# Patient Record
Sex: Male | Born: 1940 | Race: White | Hispanic: No | Marital: Married | Smoking: Never smoker
Health system: Southern US, Community
[De-identification: ages and names within clinical notes are randomized; demographics above are authoritative.]

## PROBLEM LIST (undated history)

## (undated) DIAGNOSIS — M545 Low back pain: Secondary | ICD-10-CM

## (undated) DIAGNOSIS — M1990 Unspecified osteoarthritis, unspecified site: Secondary | ICD-10-CM

## (undated) DIAGNOSIS — Z95810 Presence of automatic (implantable) cardiac defibrillator: Secondary | ICD-10-CM

## (undated) DIAGNOSIS — F329 Major depressive disorder, single episode, unspecified: Secondary | ICD-10-CM

## (undated) DIAGNOSIS — D689 Coagulation defect, unspecified: Secondary | ICD-10-CM

## (undated) DIAGNOSIS — N481 Balanitis: Secondary | ICD-10-CM

## (undated) DIAGNOSIS — Z86010 Personal history of colonic polyps: Secondary | ICD-10-CM

## (undated) DIAGNOSIS — I428 Other cardiomyopathies: Secondary | ICD-10-CM

## (undated) DIAGNOSIS — G43909 Migraine, unspecified, not intractable, without status migrainosus: Secondary | ICD-10-CM

## (undated) DIAGNOSIS — R0609 Other forms of dyspnea: Secondary | ICD-10-CM

## (undated) DIAGNOSIS — E119 Type 2 diabetes mellitus without complications: Secondary | ICD-10-CM

## (undated) DIAGNOSIS — N529 Male erectile dysfunction, unspecified: Secondary | ICD-10-CM

## (undated) DIAGNOSIS — Z9289 Personal history of other medical treatment: Secondary | ICD-10-CM

## (undated) DIAGNOSIS — I219 Acute myocardial infarction, unspecified: Secondary | ICD-10-CM

## (undated) DIAGNOSIS — Z87438 Personal history of other diseases of male genital organs: Secondary | ICD-10-CM

## (undated) DIAGNOSIS — I442 Atrioventricular block, complete: Secondary | ICD-10-CM

## (undated) DIAGNOSIS — I509 Heart failure, unspecified: Secondary | ICD-10-CM

## (undated) DIAGNOSIS — Z125 Encounter for screening for malignant neoplasm of prostate: Secondary | ICD-10-CM

## (undated) DIAGNOSIS — N183 Chronic kidney disease, stage 3 (moderate): Secondary | ICD-10-CM

## (undated) DIAGNOSIS — I5042 Chronic combined systolic (congestive) and diastolic (congestive) heart failure: Secondary | ICD-10-CM

## (undated) DIAGNOSIS — H409 Unspecified glaucoma: Secondary | ICD-10-CM

## (undated) DIAGNOSIS — S93401A Sprain of unspecified ligament of right ankle, initial encounter: Secondary | ICD-10-CM

## (undated) DIAGNOSIS — I4821 Permanent atrial fibrillation: Secondary | ICD-10-CM

## (undated) DIAGNOSIS — N3946 Mixed incontinence: Secondary | ICD-10-CM

## (undated) DIAGNOSIS — R0600 Dyspnea, unspecified: Secondary | ICD-10-CM

## (undated) DIAGNOSIS — Z87442 Personal history of urinary calculi: Secondary | ICD-10-CM

## (undated) DIAGNOSIS — M6208 Separation of muscle (nontraumatic), other site: Secondary | ICD-10-CM

## (undated) DIAGNOSIS — I739 Peripheral vascular disease, unspecified: Secondary | ICD-10-CM

## (undated) DIAGNOSIS — I499 Cardiac arrhythmia, unspecified: Secondary | ICD-10-CM

## (undated) DIAGNOSIS — M47816 Spondylosis without myelopathy or radiculopathy, lumbar region: Secondary | ICD-10-CM

## (undated) DIAGNOSIS — E785 Hyperlipidemia, unspecified: Secondary | ICD-10-CM

## (undated) DIAGNOSIS — B9681 Helicobacter pylori [H. pylori] as the cause of diseases classified elsewhere: Secondary | ICD-10-CM

## (undated) DIAGNOSIS — G459 Transient cerebral ischemic attack, unspecified: Secondary | ICD-10-CM

## (undated) DIAGNOSIS — Z87898 Personal history of other specified conditions: Secondary | ICD-10-CM

## (undated) DIAGNOSIS — N401 Enlarged prostate with lower urinary tract symptoms: Secondary | ICD-10-CM

## (undated) DIAGNOSIS — K219 Gastro-esophageal reflux disease without esophagitis: Secondary | ICD-10-CM

## (undated) DIAGNOSIS — F32A Depression, unspecified: Secondary | ICD-10-CM

## (undated) DIAGNOSIS — I10 Essential (primary) hypertension: Secondary | ICD-10-CM

## (undated) DIAGNOSIS — G629 Polyneuropathy, unspecified: Secondary | ICD-10-CM

## (undated) DIAGNOSIS — Z950 Presence of cardiac pacemaker: Secondary | ICD-10-CM

## (undated) DIAGNOSIS — N400 Enlarged prostate without lower urinary tract symptoms: Secondary | ICD-10-CM

## (undated) DIAGNOSIS — N200 Calculus of kidney: Secondary | ICD-10-CM

## (undated) DIAGNOSIS — M810 Age-related osteoporosis without current pathological fracture: Secondary | ICD-10-CM

## (undated) DIAGNOSIS — M159 Polyosteoarthritis, unspecified: Secondary | ICD-10-CM

## (undated) DIAGNOSIS — C44609 Unspecified malignant neoplasm of skin of left upper limb, including shoulder: Secondary | ICD-10-CM

## (undated) DIAGNOSIS — K2970 Gastritis, unspecified, without bleeding: Secondary | ICD-10-CM

## (undated) HISTORY — PX: BACK SURGERY: SHX140

## (undated) HISTORY — DX: Benign prostatic hyperplasia without lower urinary tract symptoms: N40.0

## (undated) HISTORY — DX: Age-related osteoporosis without current pathological fracture: M81.0

## (undated) HISTORY — DX: Permanent atrial fibrillation: I48.21

## (undated) HISTORY — DX: Coagulation defect, unspecified: D68.9

## (undated) HISTORY — PX: SPINE SURGERY: SHX786

## (undated) HISTORY — DX: Atrioventricular block, complete: I44.2

## (undated) HISTORY — DX: Gastritis, unspecified, without bleeding: K29.70

## (undated) HISTORY — PX: RETINAL DETACHMENT SURGERY: SHX105

## (undated) HISTORY — DX: Male erectile dysfunction, unspecified: N52.9

## (undated) HISTORY — PX: WISDOM TOOTH EXTRACTION: SHX21

## (undated) HISTORY — DX: Spondylosis without myelopathy or radiculopathy, lumbar region: M47.816

## (undated) HISTORY — PX: JOINT REPLACEMENT: SHX530

## (undated) HISTORY — DX: Hyperlipidemia, unspecified: E78.5

## (undated) HISTORY — PX: TRANSTHORACIC ECHOCARDIOGRAM: SHX275

## (undated) HISTORY — DX: Chronic kidney disease, stage 3 (moderate): N18.3

## (undated) HISTORY — DX: Benign prostatic hyperplasia with lower urinary tract symptoms: N40.1

## (undated) HISTORY — DX: Balanitis: N48.1

## (undated) HISTORY — DX: Personal history of other specified conditions: Z87.898

## (undated) HISTORY — DX: Encounter for screening for malignant neoplasm of prostate: Z12.5

## (undated) HISTORY — DX: Dyspnea, unspecified: R06.00

## (undated) HISTORY — DX: Personal history of other medical treatment: Z92.89

## (undated) HISTORY — DX: Calculus of kidney: N20.0

## (undated) HISTORY — DX: Separation of muscle (nontraumatic), other site: M62.08

## (undated) HISTORY — DX: Mixed incontinence: N39.46

## (undated) HISTORY — PX: CATARACT EXTRACTION W/ INTRAOCULAR LENS IMPLANT & ANTERIOR VITRECTOMY, BILATERAL: SHX1310

## (undated) HISTORY — DX: Helicobacter pylori (H. pylori) as the cause of diseases classified elsewhere: B96.81

## (undated) HISTORY — DX: Personal history of other diseases of male genital organs: Z87.438

## (undated) HISTORY — DX: Polyosteoarthritis, unspecified: M15.9

## (undated) HISTORY — DX: Low back pain: M54.5

## (undated) HISTORY — PX: COLONOSCOPY W/ POLYPECTOMY: SHX1380

## (undated) HISTORY — DX: Unspecified glaucoma: H40.9

## (undated) HISTORY — DX: Other forms of dyspnea: R06.09

## (undated) HISTORY — DX: Personal history of colonic polyps: Z86.010

## (undated) HISTORY — DX: Chronic combined systolic (congestive) and diastolic (congestive) heart failure: I50.42

## (undated) HISTORY — DX: Sprain of unspecified ligament of right ankle, initial encounter: S93.401A

---

## 1974-07-24 HISTORY — PX: LUMBAR LAMINECTOMY: SHX95

## 1997-10-28 ENCOUNTER — Ambulatory Visit (HOSPITAL_COMMUNITY): Admission: RE | Admit: 1997-10-28 | Discharge: 1997-10-29 | Payer: Self-pay | Admitting: Ophthalmology

## 1999-07-13 ENCOUNTER — Encounter: Admission: RE | Admit: 1999-07-13 | Discharge: 1999-07-13 | Payer: Self-pay | Admitting: Family Medicine

## 1999-07-13 ENCOUNTER — Encounter: Payer: Self-pay | Admitting: Family Medicine

## 2000-08-30 ENCOUNTER — Ambulatory Visit (HOSPITAL_COMMUNITY): Admission: RE | Admit: 2000-08-30 | Discharge: 2000-08-30 | Payer: Self-pay | Admitting: Gastroenterology

## 2001-06-28 ENCOUNTER — Encounter: Payer: Self-pay | Admitting: Emergency Medicine

## 2001-06-28 ENCOUNTER — Emergency Department (HOSPITAL_COMMUNITY): Admission: EM | Admit: 2001-06-28 | Discharge: 2001-06-28 | Payer: Self-pay | Admitting: Emergency Medicine

## 2002-04-14 ENCOUNTER — Encounter: Payer: Self-pay | Admitting: Family Medicine

## 2002-04-14 ENCOUNTER — Encounter: Admission: RE | Admit: 2002-04-14 | Discharge: 2002-04-14 | Payer: Self-pay | Admitting: Family Medicine

## 2003-07-01 ENCOUNTER — Ambulatory Visit (HOSPITAL_COMMUNITY): Admission: RE | Admit: 2003-07-01 | Discharge: 2003-07-01 | Payer: Self-pay | Admitting: Family Medicine

## 2005-10-30 ENCOUNTER — Encounter: Admission: RE | Admit: 2005-10-30 | Discharge: 2005-10-30 | Payer: Self-pay | Admitting: Cardiovascular Disease

## 2005-11-02 ENCOUNTER — Ambulatory Visit (HOSPITAL_COMMUNITY): Admission: RE | Admit: 2005-11-02 | Discharge: 2005-11-02 | Payer: Self-pay | Admitting: *Deleted

## 2005-11-02 DIAGNOSIS — Z9289 Personal history of other medical treatment: Secondary | ICD-10-CM

## 2005-11-02 HISTORY — DX: Personal history of other medical treatment: Z92.89

## 2005-11-22 ENCOUNTER — Ambulatory Visit (HOSPITAL_COMMUNITY): Admission: RE | Admit: 2005-11-22 | Discharge: 2005-11-22 | Payer: Self-pay | Admitting: Cardiovascular Disease

## 2006-10-18 ENCOUNTER — Ambulatory Visit (HOSPITAL_COMMUNITY): Admission: RE | Admit: 2006-10-18 | Discharge: 2006-10-18 | Payer: Self-pay | Admitting: Gastroenterology

## 2006-10-18 ENCOUNTER — Encounter (INDEPENDENT_AMBULATORY_CARE_PROVIDER_SITE_OTHER): Payer: Self-pay | Admitting: *Deleted

## 2006-10-18 HISTORY — PX: ESOPHAGOGASTRODUODENOSCOPY: SHX1529

## 2007-06-26 ENCOUNTER — Encounter: Admission: RE | Admit: 2007-06-26 | Discharge: 2007-06-26 | Payer: Self-pay | Admitting: Internal Medicine

## 2007-07-10 ENCOUNTER — Encounter
Admission: RE | Admit: 2007-07-10 | Discharge: 2007-07-10 | Payer: Self-pay | Admitting: Physical Medicine and Rehabilitation

## 2008-08-19 ENCOUNTER — Emergency Department (HOSPITAL_COMMUNITY): Admission: EM | Admit: 2008-08-19 | Discharge: 2008-08-19 | Payer: Self-pay | Admitting: Emergency Medicine

## 2010-01-16 ENCOUNTER — Emergency Department (HOSPITAL_BASED_OUTPATIENT_CLINIC_OR_DEPARTMENT_OTHER): Admission: EM | Admit: 2010-01-16 | Discharge: 2010-01-16 | Payer: Self-pay | Admitting: Emergency Medicine

## 2010-01-16 ENCOUNTER — Ambulatory Visit: Payer: Self-pay | Admitting: Diagnostic Radiology

## 2010-01-26 ENCOUNTER — Encounter: Admission: RE | Admit: 2010-01-26 | Discharge: 2010-01-26 | Payer: Self-pay | Admitting: Neurology

## 2010-07-24 HISTORY — PX: CARDIOVASCULAR STRESS TEST: SHX262

## 2010-11-07 LAB — DIFFERENTIAL
Basophils Absolute: 0 10*3/uL (ref 0.0–0.1)
Basophils Relative: 0 % (ref 0–1)
Eosinophils Absolute: 0.1 10*3/uL (ref 0.0–0.7)
Eosinophils Relative: 3 % (ref 0–5)
Lymphocytes Relative: 28 % (ref 12–46)
Lymphs Abs: 1.5 10*3/uL (ref 0.7–4.0)
Monocytes Absolute: 0.4 10*3/uL (ref 0.1–1.0)
Monocytes Relative: 7 % (ref 3–12)
Neutro Abs: 3.4 10*3/uL (ref 1.7–7.7)
Neutrophils Relative %: 62 % (ref 43–77)

## 2010-11-07 LAB — POCT CARDIAC MARKERS
CKMB, poc: <1 ng/mL — ABNORMAL LOW (ref 1.0–8.0)
Myoglobin, poc: 51.8 ng/mL (ref 12–200)
Troponin i, poc: <0.05 ng/mL (ref 0.00–0.09)

## 2010-11-07 LAB — POCT I-STAT, CHEM 8
BUN: 22 mg/dL (ref 6–23)
Calcium, Ion: 1.19 mmol/L (ref 1.12–1.32)
Chloride: 103 mEq/L (ref 96–112)
Creatinine, Ser: 1 mg/dL (ref 0.4–1.5)
Glucose, Bld: 140 mg/dL — ABNORMAL HIGH (ref 70–99)
HCT: 46 % (ref 39.0–52.0)
Hemoglobin: 15.6 g/dL (ref 13.0–17.0)
Potassium: 3.8 mEq/L (ref 3.5–5.1)
Sodium: 141 mEq/L (ref 135–145)
TCO2: 26 mmol/L (ref 0–100)

## 2010-11-07 LAB — CBC
HCT: 44.4 % (ref 39.0–52.0)
Hemoglobin: 14.9 g/dL (ref 13.0–17.0)
MCHC: 33.6 g/dL (ref 30.0–36.0)
MCV: 87.9 fL (ref 78.0–100.0)
Platelets: 182 10*3/uL (ref 150–400)
RBC: 5.05 MIL/uL (ref 4.22–5.81)
RDW: 13.8 % (ref 11.5–15.5)
WBC: 5.4 10*3/uL (ref 4.0–10.5)

## 2010-12-09 NOTE — Cardiovascular Report (Signed)
Mason [name], Mason [name] NO.:  [id number]   MEDICAL RECORD NO.:  [id number]          PATIENT TYPE:  OIB   LOCATION:  2899                         FACILITY:  [facility]   PHYSICIAN:  Darlin Priestly, MD  DATE OF BIRTH:  Dec 09, 1940   DATE OF PROCEDURE:  11/02/2005  DATE OF DISCHARGE:                              CARDIAC CATHETERIZATION   PROCEDURE:  A head-up tilt-table testing.   COMPLICATIONS:  None.   INDICATIONS:  [name] is a 70 year old male, a patient of Dr. Daphene Jaeger and  Dr. Garner Nash with a history of diabetes, hypertension, history of sinus  bradycardia with first-degree AV block.  He has had recurrent episodes of  dizziness, but no episodes of syncope.  He is now referred for tilt-table  testing to rule out possible neurocardiogenic etiology.   After obtaining informed consent, patient was brought to the cardiac cath  lab in a fasting state.  He was then placed in the supine position and  hemodynamic measurements obtained.  Resting blood pressure 131/79, resting  heart rate 50.  He was monitored for approximately 5 minutes.  He is then  tilted to a 70 degree head-up position, which was maintained for 45 minutes.  He had no significant change in his heart rate or blood pressure.  He  remained asymptomatic.  After 45 minutes, he was returned to a supine  position.  Again, his hemodynamics remained within normal limits.  The test  was then terminated.  He is then transferred to the recovery room in stable  condition.   CONCLUSIONS:  Negative head-up tilt table testing.      Darlin Priestly, MD  Electronically Signed     RHM/MEDQ  D:  11/02/2005  T:  11/02/2005  Job:  [id number]   cc:   Nicki Guadalajara, M.D.  Fax: [phone]   Olene Craven, M.D.  Fax: [phone]

## 2010-12-09 NOTE — Procedures (Signed)
[facility]  Patient:    Mason [name], Mason [name]                        MRN: [id number] Proc. Date: 08/30/00 Adm. Date:  [id number] Attending:  Louie Bun CC:         Desma Maxim, M.D.   Procedure Report  PROCEDURE:  Colonoscopy.  INDICATION FOR PROCEDURE:  Family history of colon cancer in a first-degree relative.  DESCRIPTION OF PROCEDURE:  The patient was placed in the left lateral decubitus position and placed on the pulse monitor with continuous low-flow oxygen delivered by nasal cannula.  He was sedated with 70 mg IV Demerol and 6 mg IV Versed.  The Olympus video colonoscope was inserted into the rectum and advanced to the cecum, confirmed by transillumination at McBurneys point and visualization of the ileocecal valve and appendiceal orifice.  The prep was excellent.  The cecum, ascending, transverse, descending, and sigmoid colon all appeared normal with no masses, polyps, diverticula, or other mucosal abnormalities.  The rectum likewise appeared normal, and retroflex view of the anus revealed some small internal hemorrhoids.  The colonoscope was then withdrawn and the patient returned to the recovery room in stable condition. He tolerated the procedure well, and there were no immediate complications.  IMPRESSION:  Internal hemorrhoids, otherwise normal colonoscopy.  PLAN:  Repeat colonoscopy in five years. DD:  08/30/00 TD:  08/31/00 Job: [id number]

## 2011-06-24 DIAGNOSIS — N400 Enlarged prostate without lower urinary tract symptoms: Secondary | ICD-10-CM

## 2011-06-24 HISTORY — DX: Benign prostatic hyperplasia without lower urinary tract symptoms: N40.0

## 2011-08-04 DIAGNOSIS — Z961 Presence of intraocular lens: Secondary | ICD-10-CM | POA: Diagnosis not present

## 2011-08-04 DIAGNOSIS — H4011X0 Primary open-angle glaucoma, stage unspecified: Secondary | ICD-10-CM | POA: Diagnosis not present

## 2011-08-04 DIAGNOSIS — M545 Low back pain, unspecified: Secondary | ICD-10-CM | POA: Diagnosis not present

## 2011-08-19 ENCOUNTER — Emergency Department (INDEPENDENT_AMBULATORY_CARE_PROVIDER_SITE_OTHER): Payer: Medicare Other

## 2011-08-19 ENCOUNTER — Encounter (HOSPITAL_BASED_OUTPATIENT_CLINIC_OR_DEPARTMENT_OTHER): Payer: Self-pay | Admitting: *Deleted

## 2011-08-19 ENCOUNTER — Emergency Department (HOSPITAL_BASED_OUTPATIENT_CLINIC_OR_DEPARTMENT_OTHER)
Admission: EM | Admit: 2011-08-19 | Discharge: 2011-08-19 | Disposition: A | Discharge status: Home / Self Care | Payer: Medicare Other | Attending: Emergency Medicine | Admitting: Emergency Medicine

## 2011-08-19 DIAGNOSIS — F329 Major depressive disorder, single episode, unspecified: Secondary | ICD-10-CM | POA: Insufficient documentation

## 2011-08-19 DIAGNOSIS — Z79899 Other long term (current) drug therapy: Secondary | ICD-10-CM | POA: Insufficient documentation

## 2011-08-19 DIAGNOSIS — M549 Dorsalgia, unspecified: Secondary | ICD-10-CM | POA: Diagnosis not present

## 2011-08-19 DIAGNOSIS — I10 Essential (primary) hypertension: Secondary | ICD-10-CM | POA: Insufficient documentation

## 2011-08-19 DIAGNOSIS — M519 Unspecified thoracic, thoracolumbar and lumbosacral intervertebral disc disorder: Secondary | ICD-10-CM

## 2011-08-19 DIAGNOSIS — M545 Low back pain, unspecified: Secondary | ICD-10-CM

## 2011-08-19 DIAGNOSIS — E119 Type 2 diabetes mellitus without complications: Secondary | ICD-10-CM | POA: Insufficient documentation

## 2011-08-19 DIAGNOSIS — F3289 Other specified depressive episodes: Secondary | ICD-10-CM | POA: Insufficient documentation

## 2011-08-19 HISTORY — DX: Depression, unspecified: F32.A

## 2011-08-19 HISTORY — DX: Major depressive disorder, single episode, unspecified: F32.9

## 2011-08-19 HISTORY — DX: Essential (primary) hypertension: I10

## 2011-08-19 LAB — URINALYSIS, ROUTINE W REFLEX MICROSCOPIC
Bilirubin Urine: NEGATIVE
Glucose, UA: NEGATIVE mg/dL
Hgb urine dipstick: NEGATIVE
Ketones, ur: NEGATIVE mg/dL
Leukocytes, UA: NEGATIVE
Nitrite: NEGATIVE
Protein, ur: NEGATIVE mg/dL
Specific Gravity, Urine: 1.012 (ref 1.005–1.030)
Urobilinogen, UA: 0.2 mg/dL (ref 0.0–1.0)
pH: 5.5 (ref 5.0–8.0)

## 2011-08-19 MED ORDER — HYDROMORPHONE HCL PF 2 MG/ML IJ SOLN
2.0000 mg | Freq: Once | INTRAMUSCULAR | Status: AC
  Administered 2011-08-19: 2 mg via INTRAMUSCULAR
  Filled 2011-08-19: qty 1

## 2011-08-19 MED ORDER — OXYCODONE-ACETAMINOPHEN 5-325 MG PO TABS: 2.0000 | ORAL_TABLET | ORAL | Status: AC | PRN

## 2011-08-19 MED ORDER — ONDANSETRON HCL 4 MG/2ML IJ SOLN
4.0000 mg | Freq: Once | INTRAMUSCULAR | Status: AC
  Administered 2011-08-19: 4 mg via INTRAMUSCULAR
  Filled 2011-08-19: qty 2

## 2011-08-19 MED ORDER — KETOROLAC TROMETHAMINE 60 MG/2ML IM SOLN
60.0000 mg | Freq: Once | INTRAMUSCULAR | Status: AC
  Administered 2011-08-19: 60 mg via INTRAMUSCULAR
  Filled 2011-08-19: qty 2
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
## 2011-08-19 NOTE — ED Provider Notes (Signed)
History     CSN: [id number]  Arrival date & time 08/19/11  1443   First MD Initiated Contact with Patient 08/19/11 1642      Chief Complaint  Patient presents with  . Back Pain    (Consider location/radiation/quality/duration/timing/severity/associated sxs/prior treatment) Patient is a 71 y.o. male presenting with back pain. The history is provided by the patient. No language interpreter was used.  Back Pain  This is a recurrent problem. The current episode started more than 1 week ago. The problem has been gradually worsening. The pain is associated with twisting. The pain is present in the lumbar spine. The quality of the pain is described as aching. The pain does not radiate. The pain is at a severity of 4/10. The symptoms are aggravated by certain positions. The pain is worse during the night. He has tried nothing for the symptoms. The treatment provided mild relief. Risk factors: hx of back problems.    Past Medical History  Diagnosis Date  . Back pain   . Hypertension   . Diabetes mellitus   . Depression     Past Surgical History  Procedure Date  . Eye surgery   . Back surgery     No family history on file.  History  Substance Use Topics  . Smoking status: Never Smoker   . Smokeless tobacco: Never Used  . Alcohol Use: 3.6 oz/week    6 Cans of beer per week      Review of Systems  Musculoskeletal: Positive for back pain.  All other systems reviewed and are negative.    Allergies  Review of patient's allergies indicates no known allergies.  Home Medications   Current Outpatient Rx  Name Route Sig Dispense Refill  . ASPIRIN 81 MG PO TABS Oral Take 160 mg by mouth daily.    [name] VITAMIN D 1000 UNITS PO TABS Oral Take 1,000 Units by mouth daily.    [name] ESOMEPRAZOLE MAGNESIUM 40 MG PO CPDR Oral Take 40 mg by mouth 2 (two) times daily.    . L-METHYLFOLATE-B6-B12 3-35-2 MG PO TABS Oral Take 1 tablet by mouth daily.    [name] LOSARTAN POTASSIUM 50 MG PO TABS Oral  Take 50 mg by mouth daily.    [name] METFORMIN HCL 1000 MG PO TABS Oral Take 1,000 mg by mouth 2 (two) times daily.    [name] ROPINIROLE HCL 1 MG PO TABS Oral Take 1 mg by mouth daily.    . SERTRALINE HCL 50 MG PO TABS Oral Take 50 mg by mouth daily.    [name] SIMVASTATIN 20 MG PO TABS Oral Take 20 mg by mouth every evening.    [name] SITAGLIPTIN PHOSPHATE 100 MG PO TABS Oral Take 100 mg by mouth daily.      BP 148/86  Pulse 58  Temp(Src) 98.2 F (36.8 C) (Oral)  Resp 18  SpO2 99%  Physical Exam  Vitals reviewed. Constitutional: He is oriented to person, place, and time. He appears well-developed.  HENT:  Head: Normocephalic.  Eyes: Pupils are equal, round, and reactive to light.  Neck: Normal range of motion.  Cardiovascular: Normal rate and normal heart sounds.   Pulmonary/Chest: Effort normal.  Abdominal: Soft. Bowel sounds are normal.  Musculoskeletal:       Tender low back,  Pain with movement.  nv and ns intact  Neurological: He is alert and oriented to person, place, and time.  Skin: Skin is warm.  Psychiatric: He has a normal mood and affect.  ED Course  Procedures (including critical care time)   Labs Reviewed  URINALYSIS, ROUTINE W REFLEX MICROSCOPIC   Dg Lumbar Spine Complete  08/19/2011  *RADIOLOGY REPORT*  Clinical Data: Low back pain  LUMBAR SPINE - COMPLETE 4+ VIEW  Comparison: Lumbar spine and 01/16/2010  Findings: Lateral view of the lumbar spine demonstrate retrolisthesis of L1 on L2 not changed from prior.  There is disc space narrowing at L1-L2 also similar to prior.  There is endplate osteophytosis from L1-L5 which is unchanged.  No evidence of new subluxation or loss of vertebral body height.  No pars fracture.  IMPRESSION:  1.  No change from prior. 2.  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  Original Report Authenticated By: Genevive Bi, M.D.     No diagnosis found.    MDM  Pt given torodol, dilaudid and zofran.  I will treat with percocet.  Pt  advised to see his MD for recheck on Monday        Langston Masker, [place] 08/19/11 1906

## 2011-08-19 NOTE — ED Provider Notes (Signed)
Medical screening examination/treatment/procedure(s) were performed by non-physician practitioner and as supervising physician I was immediately available for consultation/collaboration.   Forbes Cellar, MD 08/19/11 [phone]

## 2011-08-30 ENCOUNTER — Emergency Department (INDEPENDENT_AMBULATORY_CARE_PROVIDER_SITE_OTHER): Payer: Medicare Other

## 2011-08-30 ENCOUNTER — Emergency Department (HOSPITAL_BASED_OUTPATIENT_CLINIC_OR_DEPARTMENT_OTHER)
Admission: EM | Admit: 2011-08-30 | Discharge: 2011-08-30 | Disposition: A | Discharge status: Home / Self Care | Payer: Medicare Other | Attending: Emergency Medicine | Admitting: Emergency Medicine

## 2011-08-30 ENCOUNTER — Encounter (HOSPITAL_BASED_OUTPATIENT_CLINIC_OR_DEPARTMENT_OTHER): Payer: Self-pay | Admitting: Emergency Medicine

## 2011-08-30 DIAGNOSIS — E119 Type 2 diabetes mellitus without complications: Secondary | ICD-10-CM | POA: Insufficient documentation

## 2011-08-30 DIAGNOSIS — Z79899 Other long term (current) drug therapy: Secondary | ICD-10-CM | POA: Diagnosis not present

## 2011-08-30 DIAGNOSIS — I10 Essential (primary) hypertension: Secondary | ICD-10-CM | POA: Insufficient documentation

## 2011-08-30 DIAGNOSIS — F329 Major depressive disorder, single episode, unspecified: Secondary | ICD-10-CM | POA: Diagnosis not present

## 2011-08-30 DIAGNOSIS — M5137 Other intervertebral disc degeneration, lumbosacral region: Secondary | ICD-10-CM | POA: Diagnosis not present

## 2011-08-30 DIAGNOSIS — M5126 Other intervertebral disc displacement, lumbar region: Secondary | ICD-10-CM | POA: Diagnosis not present

## 2011-08-30 DIAGNOSIS — F3289 Other specified depressive episodes: Secondary | ICD-10-CM | POA: Insufficient documentation

## 2011-08-30 DIAGNOSIS — S61219A Laceration without foreign body of unspecified finger without damage to nail, initial encounter: Secondary | ICD-10-CM

## 2011-08-30 DIAGNOSIS — W298XXA Contact with other powered powered hand tools and household machinery, initial encounter: Secondary | ICD-10-CM | POA: Insufficient documentation

## 2011-08-30 DIAGNOSIS — S61209A Unspecified open wound of unspecified finger without damage to nail, initial encounter: Secondary | ICD-10-CM

## 2011-08-30 MED ORDER — TETANUS-DIPHTH-ACELL PERTUSSIS 5-2.5-18.5 LF-MCG/0.5 IM SUSP
0.5000 mL | Freq: Once | INTRAMUSCULAR | Status: AC
  Administered 2011-08-30: 0.5 mL via INTRAMUSCULAR
  Filled 2011-08-30: qty 0.5

## 2011-08-30 MED ORDER — LIDOCAINE HCL 2 % IJ SOLN
INTRAMUSCULAR | Status: AC
  Administered 2011-08-30: 20 mg
  Filled 2011-08-30: qty 1

## 2011-08-30 NOTE — Discharge Instructions (Signed)
Laceration Care, Adult     A laceration is a cut or lesion that goes through all layers of the skin and into the tissue just beneath the skin.  TREATMENT   Some lacerations may not require closure. Some lacerations may not be able to be closed due to an increased risk of infection. It is important to see your caregiver as soon as possible after an injury to minimize the risk of infection and maximize the opportunity for successful closure.  If closure is appropriate, pain medicines may be given, if needed. The wound will be cleaned to help prevent infection. Your caregiver will use stitches (sutures), staples, wound glue (adhesive), or skin adhesive strips to repair the laceration. These tools bring the skin edges together to allow for faster healing and a better cosmetic outcome. However, all wounds will heal with a scar. Once the wound has healed, scarring can be minimized by covering the wound with sunscreen during the day for 1 full year.  HOME CARE INSTRUCTIONS   For sutures or staples:  · Keep the wound clean and dry.   · If you were given a bandage (dressing), you should change it at least once a day. Also, change the dressing if it becomes wet or dirty, or as directed by your caregiver.   · Wash the wound with soap and water 2 times a day. Rinse the wound off with water to remove all soap. Pat the wound dry with a clean towel.   · After cleaning, apply a thin layer of the antibiotic ointment as recommended by your caregiver. This will help prevent infection and keep the dressing from sticking.   · You may shower as usual after the first 24 hours. Do not soak the wound in water until the sutures are removed.   · Only take over-the-counter or prescription medicines for pain, discomfort, or fever as directed by your caregiver.   · Get your sutures or staples removed as directed by your caregiver.   For skin adhesive strips:  · Keep the wound clean and dry.   · Do not get the skin adhesive strips wet. You may  bathe carefully, using caution to keep the wound dry.   · If the wound gets wet, pat it dry with a clean towel.   · Skin adhesive strips will fall off on their own. You may trim the strips as the wound heals. Do not remove skin adhesive strips that are still stuck to the wound. They will fall off in time.   For wound adhesive:  · You may briefly wet your wound in the shower or bath. Do not soak or scrub the wound. Do not swim. Avoid periods of heavy perspiration until the skin adhesive has fallen off on its own. After showering or bathing, gently pat the wound dry with a clean towel.   · Do not apply liquid medicine, cream medicine, or ointment medicine to your wound while the skin adhesive is in place. This may loosen the film before your wound is healed.   · If a dressing is placed over the wound, be careful not to apply tape directly over the skin adhesive. This may cause the adhesive to be pulled off before the wound is healed.   · Avoid prolonged exposure to sunlight or tanning lamps while the skin adhesive is in place. Exposure to ultraviolet light in the first year will darken the scar.   · The skin adhesive will usually remain in place for   5 to 10 days, then naturally fall off the skin. Do not pick at the adhesive film.   You may need a tetanus shot if:  · You cannot remember when you had your last tetanus shot.   · You have never had a tetanus shot.   If you get a tetanus shot, your arm may swell, get red, and feel warm to the touch. This is common and not a problem. If you need a tetanus shot and you choose not to have one, there is a rare chance of getting tetanus. Sickness from tetanus can be serious.  SEEK MEDICAL CARE IF:   · You have redness, swelling, or increasing pain in the wound.   · You see a red line that goes away from the wound.   · You have yellowish-white fluid (pus) coming from the wound.   · You have a fever.   · You notice a bad smell coming from the wound or dressing.   · Your wound  breaks open before or after sutures have been removed.   · You notice something coming out of the wound such as wood or glass.   · Your wound is on your hand or foot and you cannot move a finger or toe.   SEEK IMMEDIATE MEDICAL CARE IF:   · Your pain is not controlled with prescribed medicine.   · You have severe swelling around the wound causing pain and numbness or a change in color in your arm, hand, leg, or foot.   · Your wound splits open and starts bleeding.   · You have worsening numbness, weakness, or loss of function of any joint around or beyond the wound.   · You develop painful lumps near the wound or on the skin anywhere on your body.   MAKE SURE YOU:   · Understand these instructions.   · Will watch your condition.   · Will get help right away if you are not doing well or get worse.   Document Released: 07/10/2005 Document Revised: 03/22/2011 Document Reviewed: 01/03/2011  [employer]® Patient Information ©2012 [employer].

## 2011-08-30 NOTE — ED Notes (Signed)
Finger splint placed for comfort.

## 2011-08-30 NOTE — ED Notes (Signed)
Patient transported to X-ray 
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
## 2011-08-30 NOTE — ED Notes (Signed)
Left 1st finger laceration this afternoon.  Bleeding controlled.  Good CMS.

## 2011-08-30 NOTE — ED Notes (Signed)
No rx given 

## 2011-08-31 NOTE — ED Provider Notes (Signed)
Medical screening examination/treatment/procedure(s) were performed by non-physician practitioner and as supervising physician I was immediately available for consultation/collaboration.   Twylia Oka A. Deshon Hsiao, MD 08/31/11 1451 

## 2011-09-18 DIAGNOSIS — I10 Essential (primary) hypertension: Secondary | ICD-10-CM | POA: Diagnosis not present

## 2011-09-18 DIAGNOSIS — Z8249 Family history of ischemic heart disease and other diseases of the circulatory system: Secondary | ICD-10-CM | POA: Diagnosis not present

## 2011-09-18 DIAGNOSIS — Z79899 Other long term (current) drug therapy: Secondary | ICD-10-CM | POA: Diagnosis not present

## 2011-09-20 DIAGNOSIS — M5106 Intervertebral disc disorders with myelopathy, lumbar region: Secondary | ICD-10-CM | POA: Diagnosis not present

## 2011-09-20 DIAGNOSIS — M48061 Spinal stenosis, lumbar region without neurogenic claudication: Secondary | ICD-10-CM | POA: Diagnosis not present

## 2011-09-20 DIAGNOSIS — M4716 Other spondylosis with myelopathy, lumbar region: Secondary | ICD-10-CM | POA: Diagnosis not present

## 2011-10-02 DIAGNOSIS — K5900 Constipation, unspecified: Secondary | ICD-10-CM | POA: Diagnosis not present

## 2011-10-02 DIAGNOSIS — Z86010 Personal history of colonic polyps: Secondary | ICD-10-CM | POA: Diagnosis not present

## 2011-10-02 DIAGNOSIS — K219 Gastro-esophageal reflux disease without esophagitis: Secondary | ICD-10-CM | POA: Diagnosis not present

## 2011-10-03 DIAGNOSIS — M545 Low back pain, unspecified: Secondary | ICD-10-CM | POA: Diagnosis not present

## 2011-10-03 DIAGNOSIS — M5430 Sciatica, unspecified side: Secondary | ICD-10-CM | POA: Diagnosis not present

## 2011-10-05 DIAGNOSIS — M545 Low back pain, unspecified: Secondary | ICD-10-CM | POA: Diagnosis not present

## 2011-10-05 DIAGNOSIS — M5430 Sciatica, unspecified side: Secondary | ICD-10-CM | POA: Diagnosis not present

## 2011-10-09 DIAGNOSIS — M545 Low back pain, unspecified: Secondary | ICD-10-CM | POA: Diagnosis not present

## 2011-10-09 DIAGNOSIS — M5430 Sciatica, unspecified side: Secondary | ICD-10-CM | POA: Diagnosis not present

## 2011-10-12 DIAGNOSIS — K649 Unspecified hemorrhoids: Secondary | ICD-10-CM | POA: Diagnosis not present

## 2011-10-12 DIAGNOSIS — Z860101 Personal history of adenomatous and serrated colon polyps: Secondary | ICD-10-CM

## 2011-10-12 DIAGNOSIS — D126 Benign neoplasm of colon, unspecified: Secondary | ICD-10-CM | POA: Diagnosis not present

## 2011-10-12 DIAGNOSIS — Z86010 Personal history of colonic polyps: Secondary | ICD-10-CM

## 2011-10-12 HISTORY — DX: Personal history of adenomatous and serrated colon polyps: Z86.0101

## 2011-10-12 HISTORY — DX: Personal history of colonic polyps: Z86.010

## 2011-10-13 DIAGNOSIS — M545 Low back pain, unspecified: Secondary | ICD-10-CM | POA: Diagnosis not present

## 2011-10-13 DIAGNOSIS — M5430 Sciatica, unspecified side: Secondary | ICD-10-CM | POA: Diagnosis not present

## 2011-10-16 DIAGNOSIS — M5430 Sciatica, unspecified side: Secondary | ICD-10-CM | POA: Diagnosis not present

## 2011-10-16 DIAGNOSIS — IMO0001 Reserved for inherently not codable concepts without codable children: Secondary | ICD-10-CM | POA: Diagnosis not present

## 2011-10-16 DIAGNOSIS — I10 Essential (primary) hypertension: Secondary | ICD-10-CM | POA: Diagnosis not present

## 2011-10-16 DIAGNOSIS — M545 Low back pain, unspecified: Secondary | ICD-10-CM | POA: Diagnosis not present

## 2011-10-16 DIAGNOSIS — E7800 Pure hypercholesterolemia, unspecified: Secondary | ICD-10-CM | POA: Diagnosis not present

## 2011-10-16 DIAGNOSIS — Z79899 Other long term (current) drug therapy: Secondary | ICD-10-CM | POA: Diagnosis not present

## 2011-10-19 DIAGNOSIS — M545 Low back pain, unspecified: Secondary | ICD-10-CM | POA: Diagnosis not present

## 2011-10-19 DIAGNOSIS — M5430 Sciatica, unspecified side: Secondary | ICD-10-CM | POA: Diagnosis not present

## 2011-10-24 DIAGNOSIS — M545 Low back pain, unspecified: Secondary | ICD-10-CM | POA: Diagnosis not present

## 2011-10-24 DIAGNOSIS — M5430 Sciatica, unspecified side: Secondary | ICD-10-CM | POA: Diagnosis not present

## 2011-10-26 DIAGNOSIS — M5430 Sciatica, unspecified side: Secondary | ICD-10-CM | POA: Diagnosis not present

## 2011-10-26 DIAGNOSIS — M545 Low back pain, unspecified: Secondary | ICD-10-CM | POA: Diagnosis not present

## 2011-10-31 DIAGNOSIS — M545 Low back pain, unspecified: Secondary | ICD-10-CM | POA: Diagnosis not present

## 2011-10-31 DIAGNOSIS — M5430 Sciatica, unspecified side: Secondary | ICD-10-CM | POA: Diagnosis not present

## 2011-11-06 DIAGNOSIS — M545 Low back pain, unspecified: Secondary | ICD-10-CM | POA: Diagnosis not present

## 2011-12-21 DIAGNOSIS — E782 Mixed hyperlipidemia: Secondary | ICD-10-CM | POA: Diagnosis not present

## 2011-12-21 DIAGNOSIS — R42 Dizziness and giddiness: Secondary | ICD-10-CM | POA: Diagnosis not present

## 2011-12-21 DIAGNOSIS — I441 Atrioventricular block, second degree: Secondary | ICD-10-CM | POA: Diagnosis not present

## 2012-01-04 DIAGNOSIS — I441 Atrioventricular block, second degree: Secondary | ICD-10-CM | POA: Diagnosis not present

## 2012-01-04 DIAGNOSIS — R55 Syncope and collapse: Secondary | ICD-10-CM | POA: Diagnosis not present

## 2012-01-05 ENCOUNTER — Other Ambulatory Visit: Payer: Self-pay | Admitting: Cardiovascular Disease

## 2012-01-16 DIAGNOSIS — E785 Hyperlipidemia, unspecified: Secondary | ICD-10-CM | POA: Diagnosis not present

## 2012-01-16 DIAGNOSIS — Z79899 Other long term (current) drug therapy: Secondary | ICD-10-CM | POA: Diagnosis not present

## 2012-01-16 DIAGNOSIS — I10 Essential (primary) hypertension: Secondary | ICD-10-CM | POA: Diagnosis not present

## 2012-01-16 DIAGNOSIS — IMO0001 Reserved for inherently not codable concepts without codable children: Secondary | ICD-10-CM | POA: Diagnosis not present

## 2012-01-23 ENCOUNTER — Encounter (HOSPITAL_COMMUNITY): Payer: Self-pay | Admitting: Pharmacy Technician

## 2012-01-29 DIAGNOSIS — I4891 Unspecified atrial fibrillation: Secondary | ICD-10-CM | POA: Diagnosis not present

## 2012-01-29 DIAGNOSIS — IMO0001 Reserved for inherently not codable concepts without codable children: Secondary | ICD-10-CM | POA: Diagnosis not present

## 2012-01-29 DIAGNOSIS — Z0000 Encounter for general adult medical examination without abnormal findings: Secondary | ICD-10-CM | POA: Diagnosis not present

## 2012-01-29 DIAGNOSIS — Z23 Encounter for immunization: Secondary | ICD-10-CM | POA: Diagnosis not present

## 2012-01-30 ENCOUNTER — Other Ambulatory Visit: Payer: Self-pay | Admitting: Cardiovascular Disease

## 2012-01-30 ENCOUNTER — Ambulatory Visit
Admission: RE | Admit: 2012-01-30 | Discharge: 2012-01-30 | Disposition: A | Discharge status: Home / Self Care | Payer: Medicare Other | Source: Ambulatory Visit | Attending: Cardiovascular Disease | Admitting: Cardiovascular Disease

## 2012-01-30 DIAGNOSIS — R059 Cough, unspecified: Secondary | ICD-10-CM | POA: Diagnosis not present

## 2012-01-30 DIAGNOSIS — Z79899 Other long term (current) drug therapy: Secondary | ICD-10-CM | POA: Diagnosis not present

## 2012-01-30 DIAGNOSIS — Z01818 Encounter for other preprocedural examination: Secondary | ICD-10-CM

## 2012-01-30 DIAGNOSIS — Z01811 Encounter for preprocedural respiratory examination: Secondary | ICD-10-CM | POA: Diagnosis not present

## 2012-01-30 DIAGNOSIS — R05 Cough: Secondary | ICD-10-CM | POA: Diagnosis not present

## 2012-01-30 DIAGNOSIS — R5383 Other fatigue: Secondary | ICD-10-CM | POA: Diagnosis not present

## 2012-01-30 DIAGNOSIS — D689 Coagulation defect, unspecified: Secondary | ICD-10-CM | POA: Diagnosis not present

## 2012-01-30 DIAGNOSIS — R9389 Abnormal findings on diagnostic imaging of other specified body structures: Secondary | ICD-10-CM | POA: Diagnosis not present

## 2012-01-30 DIAGNOSIS — R5381 Other malaise: Secondary | ICD-10-CM | POA: Diagnosis not present

## 2012-02-02 DIAGNOSIS — H0230 Blepharochalasis unspecified eye, unspecified eyelid: Secondary | ICD-10-CM | POA: Diagnosis not present

## 2012-02-02 DIAGNOSIS — E119 Type 2 diabetes mellitus without complications: Secondary | ICD-10-CM | POA: Diagnosis not present

## 2012-02-02 DIAGNOSIS — Z961 Presence of intraocular lens: Secondary | ICD-10-CM | POA: Diagnosis not present

## 2012-02-04 MED ORDER — SODIUM CHLORIDE 0.9 % IR SOLN
80.0000 mg | Status: DC
  Filled 2012-02-04: qty 2

## 2012-02-04 MED ORDER — CEFAZOLIN SODIUM-DEXTROSE 2-3 GM-% IV SOLR
2.0000 g | INTRAVENOUS | Status: DC
  Filled 2012-02-04: qty 50

## 2012-02-05 ENCOUNTER — Encounter (HOSPITAL_COMMUNITY)
Admission: RE | Disposition: A | Discharge status: Home / Self Care | Payer: Self-pay | Source: Ambulatory Visit | Attending: Cardiovascular Disease

## 2012-02-05 ENCOUNTER — Ambulatory Visit (HOSPITAL_COMMUNITY)
Admission: RE | Admit: 2012-02-05 | Discharge: 2012-02-06 | Disposition: A | Discharge status: Home / Self Care | Payer: Medicare Other | Source: Ambulatory Visit | Attending: Cardiovascular Disease | Admitting: Cardiovascular Disease

## 2012-02-05 ENCOUNTER — Encounter (HOSPITAL_COMMUNITY): Payer: Self-pay | Admitting: General Practice

## 2012-02-05 DIAGNOSIS — M549 Dorsalgia, unspecified: Secondary | ICD-10-CM | POA: Diagnosis not present

## 2012-02-05 DIAGNOSIS — E785 Hyperlipidemia, unspecified: Secondary | ICD-10-CM | POA: Diagnosis present

## 2012-02-05 DIAGNOSIS — R55 Syncope and collapse: Secondary | ICD-10-CM | POA: Diagnosis present

## 2012-02-05 DIAGNOSIS — I495 Sick sinus syndrome: Secondary | ICD-10-CM | POA: Insufficient documentation

## 2012-02-05 DIAGNOSIS — I442 Atrioventricular block, complete: Secondary | ICD-10-CM | POA: Diagnosis present

## 2012-02-05 DIAGNOSIS — E119 Type 2 diabetes mellitus without complications: Secondary | ICD-10-CM | POA: Diagnosis not present

## 2012-02-05 DIAGNOSIS — F3289 Other specified depressive episodes: Secondary | ICD-10-CM | POA: Insufficient documentation

## 2012-02-05 DIAGNOSIS — I10 Essential (primary) hypertension: Secondary | ICD-10-CM | POA: Diagnosis not present

## 2012-02-05 DIAGNOSIS — I4891 Unspecified atrial fibrillation: Secondary | ICD-10-CM | POA: Diagnosis not present

## 2012-02-05 DIAGNOSIS — F329 Major depressive disorder, single episode, unspecified: Secondary | ICD-10-CM | POA: Insufficient documentation

## 2012-02-05 DIAGNOSIS — Z950 Presence of cardiac pacemaker: Secondary | ICD-10-CM | POA: Diagnosis present

## 2012-02-05 HISTORY — DX: Gastro-esophageal reflux disease without esophagitis: K21.9

## 2012-02-05 HISTORY — DX: Unspecified osteoarthritis, unspecified site: M19.90

## 2012-02-05 HISTORY — DX: Presence of cardiac pacemaker: Z95.0

## 2012-02-05 HISTORY — PX: INSERT / REPLACE / REMOVE PACEMAKER: SUR710

## 2012-02-05 HISTORY — DX: Cardiac arrhythmia, unspecified: I49.9

## 2012-02-05 HISTORY — PX: PERMANENT PACEMAKER INSERTION: SHX5480

## 2012-02-05 LAB — GLUCOSE, CAPILLARY
Glucose-Capillary: 131 mg/dL — ABNORMAL HIGH (ref 70–99)
Glucose-Capillary: 136 mg/dL — ABNORMAL HIGH (ref 70–99)
Glucose-Capillary: 170 mg/dL — ABNORMAL HIGH (ref 70–99)
Glucose-Capillary: 124 mg/dL — ABNORMAL HIGH (ref 70–99)
Glucose-Capillary: 174 mg/dL — ABNORMAL HIGH (ref 70–99)

## 2012-02-05 LAB — SURGICAL PCR SCREEN
MRSA, PCR: NEGATIVE
Staphylococcus aureus: NEGATIVE

## 2012-02-05 SURGERY — PERMANENT PACEMAKER INSERTION
Anesthesia: LOCAL

## 2012-02-05 MED ORDER — METFORMIN HCL 500 MG PO TABS
1000.0000 mg | ORAL_TABLET | Freq: Two times a day (BID) | ORAL | Status: DC
  Administered 2012-02-05 – 2012-02-06 (×2): 1000 mg via ORAL
  Filled 2012-02-05 (×3): qty 2

## 2012-02-05 MED ORDER — PANTOPRAZOLE SODIUM 40 MG PO TBEC
40.0000 mg | DELAYED_RELEASE_TABLET | Freq: Every day | ORAL | Status: DC
  Administered 2012-02-05 – 2012-02-06 (×2): 40 mg via ORAL
  Filled 2012-02-05 (×2): qty 1

## 2012-02-05 MED ORDER — SODIUM CHLORIDE 0.9 % IJ SOLN: 3.0000 mL | Freq: Two times a day (BID) | INTRAMUSCULAR | Status: DC

## 2012-02-05 MED ORDER — FENTANYL CITRATE 0.05 MG/ML IJ SOLN
INTRAMUSCULAR | Status: AC
  Filled 2012-02-05: qty 2

## 2012-02-05 MED ORDER — SODIUM CHLORIDE 0.9 % IJ SOLN: 3.0000 mL | INTRAMUSCULAR | Status: DC | PRN

## 2012-02-05 MED ORDER — ROPINIROLE HCL 1 MG PO TABS
1.0000 mg | ORAL_TABLET | Freq: Every day | ORAL | Status: DC
  Administered 2012-02-05 – 2012-02-06 (×2): 1 mg via ORAL
  Filled 2012-02-05 (×2): qty 1

## 2012-02-05 MED ORDER — HEPARIN (PORCINE) IN NACL 2-0.9 UNIT/ML-% IJ SOLN
INTRAMUSCULAR | Status: AC
  Filled 2012-02-05: qty 1000

## 2012-02-05 MED ORDER — SODIUM CHLORIDE 0.9 % IV SOLN
INTRAVENOUS | Status: AC
  Administered 2012-02-05: 12:00:00 via INTRAVENOUS

## 2012-02-05 MED ORDER — HYDROCODONE-ACETAMINOPHEN 5-325 MG PO TABS
1.0000 | ORAL_TABLET | ORAL | Status: DC | PRN
  Administered 2012-02-05 – 2012-02-06 (×3): 1 via ORAL
  Filled 2012-02-05 (×3): qty 1

## 2012-02-05 MED ORDER — CHLORHEXIDINE GLUCONATE 4 % EX LIQD
60.0000 mL | Freq: Once | CUTANEOUS | Status: DC
  Filled 2012-02-05: qty 60

## 2012-02-05 MED ORDER — SODIUM CHLORIDE 0.45 % IV SOLN
INTRAVENOUS | Status: DC
  Administered 2012-02-05: 08:00:00 via INTRAVENOUS

## 2012-02-05 MED ORDER — MUPIROCIN 2 % EX OINT
TOPICAL_OINTMENT | CUTANEOUS | Status: AC
  Administered 2012-02-05: 1 via NASAL
  Filled 2012-02-05: qty 22

## 2012-02-05 MED ORDER — LOSARTAN POTASSIUM 50 MG PO TABS
100.0000 mg | ORAL_TABLET | Freq: Every day | ORAL | Status: DC
  Administered 2012-02-05 – 2012-02-06 (×2): 100 mg via ORAL
  Filled 2012-02-05 (×2): qty 2

## 2012-02-05 MED ORDER — MIDAZOLAM HCL 2 MG/2ML IJ SOLN
INTRAMUSCULAR | Status: AC
  Filled 2012-02-05: qty 2

## 2012-02-05 MED ORDER — LIDOCAINE HCL (PF) 1 % IJ SOLN
INTRAMUSCULAR | Status: AC
  Filled 2012-02-05: qty 60

## 2012-02-05 MED ORDER — SODIUM CHLORIDE 0.9 % IV SOLN: 250.0000 mL | INTRAVENOUS | Status: DC | PRN

## 2012-02-05 MED ORDER — ONDANSETRON HCL 4 MG/2ML IJ SOLN: 4.0000 mg | Freq: Four times a day (QID) | INTRAMUSCULAR | Status: DC | PRN

## 2012-02-05 MED ORDER — MECLIZINE HCL 25 MG PO TABS
25.0000 mg | ORAL_TABLET | Freq: Three times a day (TID) | ORAL | Status: DC | PRN
  Filled 2012-02-05: qty 1

## 2012-02-05 MED ORDER — SERTRALINE HCL 50 MG PO TABS
50.0000 mg | ORAL_TABLET | Freq: Every day | ORAL | Status: DC
  Administered 2012-02-05 – 2012-02-06 (×2): 50 mg via ORAL
  Filled 2012-02-05 (×2): qty 1

## 2012-02-05 MED ORDER — ASPIRIN EC 81 MG PO TBEC
81.0000 mg | DELAYED_RELEASE_TABLET | Freq: Every day | ORAL | Status: DC
  Administered 2012-02-05 – 2012-02-06 (×2): 81 mg via ORAL
  Filled 2012-02-05 (×2): qty 1

## 2012-02-05 MED ORDER — SIMVASTATIN 20 MG PO TABS
20.0000 mg | ORAL_TABLET | Freq: Every evening | ORAL | Status: DC
  Administered 2012-02-05: 20 mg via ORAL
  Filled 2012-02-05 (×2): qty 1

## 2012-02-05 MED ORDER — LINAGLIPTIN 5 MG PO TABS
5.0000 mg | ORAL_TABLET | Freq: Every day | ORAL | Status: DC
  Administered 2012-02-05 – 2012-02-06 (×2): 5 mg via ORAL
  Filled 2012-02-05 (×2): qty 1

## 2012-02-05 MED ORDER — YOU HAVE A PACEMAKER BOOK
Freq: Once | Status: AC
  Administered 2012-02-05: 16:00:00
  Filled 2012-02-05: qty 1

## 2012-02-05 MED ORDER — CEFAZOLIN SODIUM-DEXTROSE 2-3 GM-% IV SOLR
INTRAVENOUS | Status: AC
  Filled 2012-02-05: qty 50

## 2012-02-05 MED ORDER — ACETAMINOPHEN 325 MG PO TABS: 325.0000 mg | ORAL_TABLET | ORAL | Status: DC | PRN

## 2012-02-05 MED ORDER — LIDOCAINE HCL (PF) 1 % IJ SOLN
INTRAMUSCULAR | Status: AC
  Filled 2012-02-05: qty 30

## 2012-02-05 MED ORDER — CEFAZOLIN SODIUM 1-5 GM-% IV SOLN
1.0000 g | Freq: Four times a day (QID) | INTRAVENOUS | Status: AC
  Administered 2012-02-05 – 2012-02-06 (×3): 1 g via INTRAVENOUS
  Filled 2012-02-05 (×3): qty 50

## 2012-02-05 NOTE — H&P (Signed)
I have seen and examined the patient along with Wilburt Finlay, PA.  I have reviewed the chart, notes and new data.  I agree with PA's note.  Key new complaints: Mild dyspnea on exertion Key examination changes: atrial fibrillation w slow VR by exam, a new finding  Sinus node dysfunction, severe. Needs dual chamber permanent pacemaker (MRI conditional device due to probable future need for MRI). This procedure has been fully reviewed with the patient and written informed consent has been obtained.  Thurmon Fair, MD, [facility] [phone] 02/05/2012, 8:50 AM

## 2012-02-05 NOTE — H&P (Signed)
Mason [name] is an 71 y.o. male.   Chief Complaint:  Second degree AVB.  HPI:  The patient this 71 year old Caucasian male with history of diabetes mellitus and hypertension. He was recently found be in second-degree AV block with marked sinus pauses of almost 4 seconds in duration. He had carotid Dopplers which showed normal findings except for a congenitally small right vertebral artery.  Subtle left ventricular hypertrophy and mild right ventricular the dilatation as well as mild to moderate mitral insufficiency and aortic valve sclerosis. It is treadmill stress test was able to exercise 11 METS.  The patient presents today for implantation of dual-chamber permanent pacemaker. He does complain of some shortness of breath which has been progressive for 2 weeks as well as dizziness and cough. He also states a couple days ago he had some diarrhea which has resolved. He otherwise denies nausea, vomiting, chest pain, orthopnea, abdominal pain, congestion, sore throat, dysuria, hematuria, hematochezia, melena, lower extremity edema, blurry vision.    Past Medical History  Diagnosis Date  . Back pain   . Hypertension   . Diabetes mellitus   . Depression     Past Surgical History  Procedure Date  . Eye surgery   . Back surgery     No family history on file. Social History:  reports that he has never smoked. He has never used smokeless tobacco. He reports that he drinks about 3.6 ounces of alcohol per week. He reports that he does not use illicit drugs.  Allergies: No Known Allergies  Medications Prior to Admission  Medication Sig Dispense Refill  . aspirin EC 81 MG tablet Take 81 mg by mouth daily.      [name] CINNAMON PO Take 1 tablet by mouth daily.      [name] esomeprazole (NEXIUM) 40 MG capsule Take 40 mg by mouth 2 (two) times daily.      [name] losartan (COZAAR) 100 MG tablet Take 100 mg by mouth daily.      . meclizine (ANTIVERT) 25 MG tablet Take 25 mg by mouth 3 (three) times daily as needed. For  vertigo.      . metFORMIN (GLUCOPHAGE) 1000 MG tablet Take 1,000 mg by mouth 2 (two) times daily.      . Multiple Vitamin (MULTIVITAMIN WITH MINERALS) TABS Take 1 tablet by mouth daily.      [name] rOPINIRole (REQUIP) 1 MG tablet Take 1 mg by mouth daily.      . sertraline (ZOLOFT) 50 MG tablet Take 50 mg by mouth daily.      . simvastatin (ZOCOR) 20 MG tablet Take 20 mg by mouth every evening.      . sitaGLIPtin (JANUVIA) 100 MG tablet Take 100 mg by mouth daily.      . celecoxib (CELEBREX) 200 MG capsule Take 200 mg by mouth daily as needed. For pain.        Results for orders placed during the hospital encounter of 02/05/12 (from the past 48 hour(s))  GLUCOSE, CAPILLARY     Status: Abnormal   Collection Time   02/05/12  7:23 AM      Component Value Range Comment   Glucose-Capillary 131 (*) 70 - 99 mg/dL    No results found.  Review of Systems  Constitutional: Negative for fever and diaphoresis.  HENT: Negative for congestion and sore throat.   Eyes: Negative for blurred vision.  Respiratory: Positive for cough and shortness of breath (progressive for 2 weeks).   Cardiovascular: Positive for  palpitations. Negative for chest pain, orthopnea, claudication and leg swelling.  Gastrointestinal: Positive for diarrhea (2 days ago. Resolved.). Negative for nausea, vomiting, abdominal pain, constipation, blood in stool and melena.  Genitourinary: Negative for dysuria and hematuria.  Neurological: Positive for dizziness.    Blood pressure 114/68, pulse 39, temperature 97 F (36.1 C), temperature source Oral, resp. rate 18, height 5' 11.5" (1.816 m), weight 99.791 kg (220 lb), SpO2 99.00%. Physical Exam  Constitutional: He is oriented to person, place, and time. He appears well-developed and well-nourished. No distress.  HENT:  Head: Normocephalic and atraumatic.  Eyes: EOM are normal. Pupils are equal, round, and reactive to light. No scleral icterus.  Neck: Normal range of motion. Neck  supple.  Cardiovascular: An irregular rhythm present. Bradycardia present.   No murmur heard. Pulses:      Radial pulses are 2+ on the right side, and 2+ on the left side.       Dorsalis pedis pulses are 2+ on the right side, and 2+ on the left side.       No carotid bruit  Respiratory: Effort normal and breath sounds normal. He has no wheezes. He has no rales.  GI: Soft. Bowel sounds are normal. There is no tenderness.  Musculoskeletal: He exhibits no edema.  Lymphadenopathy:    He has no cervical adenopathy.  Neurological: He is alert and oriented to person, place, and time. He exhibits normal muscle tone.  Skin: Skin is warm and dry.  Psychiatric: He has a normal mood and affect.     Assessment/Plan 1.  History of second degree AVB, type I 2.  AFIB with slow VR 3.  HTN 4.  DM  Plan:  Implantation of dual chamber PPM.    Genette Huertas 02/05/2012, 8:22 AM

## 2012-02-05 NOTE — CV Procedure (Signed)
Mason [name],Mason [name] Male, 71 y.o., Jan 10, 1941  Location: [facility] Bed: NONE  MRN: [id number]  CSN: [id number]   Procedure report  Procedure performed:  1. Implantation of new dual chamber permanent pacemaker 2. Fluoroscopy 3.  Light sedation  Reason for procedure: Symptomatic bradycardia due to: Sinus node dysfunction Sinus arrest Paroxysmal atrial fibrillation with slow ventricular response  Procedure performed by: Thurmon Fair, MD  Complications: None  Estimated blood loss: <10 mL  Medications administered during procedure: Ancef 2 g intravenously Lidocaine 1% 30 mL locally,  Fentanyl 25 mcg intravenously Versed 1 mg intravenously  Device details: Generator Medtronic Revo model RVDR01 serial number [id number] H Right atrial lead Medtronic 5086MRI-52cm serial number [id number] Right ventricular lead Medtronic 5086MRI-58cm serial number [id number]  Procedure details:  After the risks and benefits of the procedure were discussed the patient provided informed consent and was brought to the cardiac cath lab in the fasting state. The patient was prepped and draped in usual sterile fashion. Local anesthesia with 1% lidocaine was administered to to the left infraclavicular area. A 5-6 cm horizontal incision was made parallel with and 2-3 cm caudal to the left clavicle. Using electrocautery and blunt dissection a prepectoral pocket was created down to the level of the pectoralis major muscle fascia. The pocket was carefully inspected for hemostasis. An antibiotic-soaked sponge was placed in the pocket.  Under fluoroscopic guidance and using the modified Seldinger technique 2 separate venipunctures were performed to access the left subclavian vein. no difficulty was encountered accessing the vein.  Two J-tip guidewires were subsequently exchanged for two 8 French safe sheaths.  Under fluoroscopic guidance the ventricular lead was advanced to level of the mid to apical right  ventricular septum and thet active-fixation helix was deployed. Prominent current of injury was seen. Satisfactory pacing and sensing parameters were recorded. There was no evidence of diaphragmatic stimulation at maximum device output. The safe sheath was peeled away and the lead was secured in place with 2-0 silk.  In similar fashion the right atrial lead was advanced to the level of the atrial appendage. The active-fixation helix was deployed. There was prominent current of injury. Satisfactory sensing parameters were recorded. Pacing thresholds could not be tested due to atrial fibrillation. There was no evidence of diaphragmatic stimulation with pacing at maximum device output. The safe sheath was peeled away and the lead was secured in place with 2-0 silk.  The antibiotic-soaked sponge was removed from the pocket. The pocket was flushed with copious amounts of antibiotic solution. Reinspection showed excellent hemostasis..  The ventricular lead was connected to the generator and appropriate ventricular pacing was seen. Subsequently the atrial lead was also connected. Repeat testing of the lead parameters later showed excellent values.  The entire system was then carefully inserted in the pocket with care been taking that the leads and device assumed a comfortable position without pressure on the incision. Great care was taken that the leads be located deep to the generator. The pocket was then closed in layers using 2 layers of 2-0 Vicryl and cutaneous staples, after which a sterile dressing was applied.  At the end of the procedure the following lead parameters were encountered:  Right atrial lead  sensed P waves 1.9, impedance 905 ohms, threshold could not be tested ventricular fibrillation.  Right ventricular lead sensed [name] waves 8.5 mV, impedance 1272ohms, threshold 1.2 V at 0.5 ms pulse width.  Thurmon Fair, MD, [facility] [phone]  office [phone] pager 02/05/2012 10:17  AM  Cc: Dorothyann Peng MD

## 2012-02-06 ENCOUNTER — Ambulatory Visit (HOSPITAL_COMMUNITY): Payer: Medicare Other

## 2012-02-06 DIAGNOSIS — I10 Essential (primary) hypertension: Secondary | ICD-10-CM | POA: Diagnosis not present

## 2012-02-06 DIAGNOSIS — Z950 Presence of cardiac pacemaker: Secondary | ICD-10-CM | POA: Diagnosis not present

## 2012-02-06 DIAGNOSIS — I495 Sick sinus syndrome: Secondary | ICD-10-CM | POA: Diagnosis not present

## 2012-02-06 DIAGNOSIS — E119 Type 2 diabetes mellitus without complications: Secondary | ICD-10-CM | POA: Diagnosis not present

## 2012-02-06 DIAGNOSIS — R55 Syncope and collapse: Secondary | ICD-10-CM | POA: Diagnosis present

## 2012-02-06 DIAGNOSIS — I442 Atrioventricular block, complete: Secondary | ICD-10-CM | POA: Diagnosis present

## 2012-02-06 DIAGNOSIS — E785 Hyperlipidemia, unspecified: Secondary | ICD-10-CM | POA: Diagnosis present

## 2012-02-06 DIAGNOSIS — I4891 Unspecified atrial fibrillation: Secondary | ICD-10-CM | POA: Diagnosis not present

## 2012-02-06 LAB — GLUCOSE, CAPILLARY: Glucose-Capillary: 196 mg/dL — ABNORMAL HIGH (ref 70–99)

## 2012-02-06 MED ORDER — ACETAMINOPHEN 325 MG PO TABS: 325.0000 mg | ORAL_TABLET | ORAL | Status: AC | PRN

## 2012-02-06 MED ORDER — METFORMIN HCL 1000 MG PO TABS: 1000.0000 mg | ORAL_TABLET | Freq: Two times a day (BID) | ORAL | Status: DC

## 2012-02-06 NOTE — Discharge Summary (Signed)
Patient ID: Mason [name],  MRN: [id number], DOB/AGE: Jun 16, 1941 71 y.o.  Admit date: 02/05/2012 Discharge date: 02/06/2012  Primary Care Provider:  Primary Cardiologist: Dr Tresa Endo  Discharge Diagnoses Active Problems:  Near syncope  AVB (atrioventricular block)  Cardiac pacemaker 02/05/12  Diabetes mellitus  Dyslipidemia    Procedures: Elective pacemaker placement 02/05/12   Hospital Course:  The patient is a 71 year old Caucasian male with history of diabetes mellitus and hypertension. He was recently found be in second-degree AV block with marked sinus pauses of almost 4 seconds in duration. He had carotid Dopplers which showed normal findings except for a congenitally small right vertebral artery. Subtle left ventricular hypertrophy and mild right ventricular the dilatation as well as mild to moderate mitral insufficiency and aortic valve sclerosis. It is treadmill stress test was able to exercise 11 METS.  The patient was admitted 02/05/12 for implantation of dual-chamber permanent pacemaker.This was done 02/05/12 without complications. We feel he can be discharged 02/06/12.    Past Medical History      Discharge Vitals:  Blood pressure 139/76, pulse 65, temperature 97.7 F (36.5 C), temperature source Oral, resp. rate 12, height 5' 11.5" (1.816 m), weight 101.9 kg (224 lb 10.4 oz), SpO2 97.00%.    Labs: Results for orders placed during the hospital encounter of 02/05/12 (from the past 48 hour(s))  SURGICAL PCR SCREEN     Status: Normal   Collection Time   02/05/12  7:20 AM      Component Value Range Comment   MRSA, PCR NEGATIVE  NEGATIVE    Staphylococcus aureus NEGATIVE  NEGATIVE   GLUCOSE, CAPILLARY     Status: Abnormal   Collection Time   02/05/12  7:23 AM      Component Value Range Comment   Glucose-Capillary 131 (*) 70 - 99 mg/dL   GLUCOSE, CAPILLARY     Status: Abnormal   Collection Time   02/05/12 10:34 AM      Component Value Range Comment   Glucose-Capillary 136  (*) 70 - 99 mg/dL   GLUCOSE, CAPILLARY     Status: Abnormal   Collection Time   02/05/12  3:35 PM      Component Value Range Comment   Glucose-Capillary 170 (*) 70 - 99 mg/dL   GLUCOSE, CAPILLARY     Status: Abnormal   Collection Time   02/05/12  5:12 PM      Component Value Range Comment   Glucose-Capillary 124 (*) 70 - 99 mg/dL   GLUCOSE, CAPILLARY     Status: Abnormal   Collection Time   02/05/12 10:08 PM      Component Value Range Comment   Glucose-Capillary 174 (*) 70 - 99 mg/dL   GLUCOSE, CAPILLARY     Status: Abnormal   Collection Time   02/06/12  7:59 AM      Component Value Range Comment   Glucose-Capillary 196 (*) 70 - 99 mg/dL     Disposition:  Follow-up Information    Follow up with Abelino Derrick, PA. (office will call you)    Contact information:   [address] [place] [address] [phone]          Discharge Medications:  Medication List  As of 02/06/2012  9:49 AM   TAKE these medications         acetaminophen 325 MG tablet   Commonly known as: TYLENOL   Take 1-2 tablets (325-650 mg total) by mouth every 4 (four) hours as needed.  aspirin EC 81 MG tablet   Take 81 mg by mouth daily.      celecoxib 200 MG capsule   Commonly known as: CELEBREX   Take 200 mg by mouth daily as needed. For pain.      CINNAMON PO   Take 1 tablet by mouth daily.      esomeprazole 40 MG capsule   Commonly known as: NEXIUM   Take 40 mg by mouth 2 (two) times daily.      losartan 100 MG tablet   Commonly known as: COZAAR   Take 100 mg by mouth daily.      meclizine 25 MG tablet   Commonly known as: ANTIVERT   Take 25 mg by mouth 3 (three) times daily as needed. For vertigo.      metFORMIN 1000 MG tablet   Commonly known as: GLUCOPHAGE   Take 1 tablet (1,000 mg total) by mouth 2 (two) times daily.      multivitamin with minerals Tabs   Take 1 tablet by mouth daily.      rOPINIRole 1 MG tablet   Commonly known as: REQUIP   Take  1 mg by mouth daily.      sertraline 50 MG tablet   Commonly known as: ZOLOFT   Take 50 mg by mouth daily.      simvastatin 20 MG tablet   Commonly known as: ZOCOR   Take 20 mg by mouth every evening.      sitaGLIPtin 100 MG tablet   Commonly known as: JANUVIA   Take 100 mg by mouth daily.              Duration of Discharge Encounter: Greater than 30 minutes including physician time.  Jolene Provost PA-C 02/06/2012 9:49 AM

## 2012-02-06 NOTE — Progress Notes (Signed)
Pt. Seen and examined. Agree with the NP/PA-C note as written. Pacer site looks good. Impedance is okay. Chest x-ray looks good. Okay for discharge today. Follow-up with Dr. Royann Shivers.  Chrystie Nose, MD, [facility] Attending Cardiologist [facility]

## 2012-02-06 NOTE — Progress Notes (Signed)
Subjective:  No complaints  Objective:  Vital Signs in the last 24 hours: Temp:  [97.7 F (36.5 C)-97.9 F (36.6 C)] 97.9 F (36.6 C) (07/16 0430) Pulse Rate:  [59-63] 60  (07/16 0430) Resp:  [10-20] 13  (07/16 0430) BP: (120-138)/(69-90) 134/90 mmHg (07/16 0430) SpO2:  [97 %-100 %] 97 % (07/16 0430) Weight:  [101.9 kg (224 lb 10.4 oz)] 101.9 kg (224 lb 10.4 oz) (07/16 0020)  Intake/Output from previous day:  Intake/Output Summary (Last 24 hours) at 02/06/12 0917 Last data filed at 02/05/12 1700  Gross per 24 hour  Intake    230 ml  Output      0 ml  Net    230 ml    Physical Exam: General appearance: alert, cooperative and no distress Lungs: clear to auscultation bilaterally Heart: regular rate and rhythm Pacer site without hematoma   Rate: 78  Rhythm: paced  Lab Results: No results found for this basename: WBC:2,HGB:2,PLT:2 in the last 72 hours No results found for this basename: NA:2,K:2,CL:2,CO2:2,GLUCOSE:2,BUN:2,CREATININE:2 in the last 72 hours No results found for this basename: TROPONINI:2,CK,MB:2 in the last 72 hours Hepatic Function Panel No results found for this basename: PROT,ALBUMIN,AST,ALT,ALKPHOS,BILITOT,BILIDIR,IBILI in the last 72 hours No results found for this basename: CHOL in the last 72 hours No results found for this basename: INR in the last 72 hours  Imaging: Dg Chest 2 View  02/06/2012  *RADIOLOGY REPORT*  Clinical Data: Evaluate pacemaker placement.  CHEST - 2 VIEW  Comparison: 01/30/2012  Findings: Two views of the chest demonstrate a left subclavian dual lead cardiac pacemaker.  Skin staples are present.  The leads extend into the region of the right atrium and right ventricle. There is no evidence for a pneumothorax.  Lungs are clear without pulmonary edema.  Heart size is within normal limits.  Trachea is midline.  IMPRESSION: Placement of left cardiac pacemaker.  Negative for pneumothorax.  Original Report Authenticated By: Richarda Overlie, M.D.     Cardiac Studies:  Assessment/Plan:   Active Problems:  Near syncope  AVB (atrioventricular block)  Cardiac pacemaker 02/05/12  Diabetes mellitus  Dyslipidemia   Plan- discharge   Corine Shelter PA-C 02/06/2012, 9:17 AM

## 2012-04-01 DIAGNOSIS — I10 Essential (primary) hypertension: Secondary | ICD-10-CM | POA: Diagnosis not present

## 2012-04-01 DIAGNOSIS — IMO0001 Reserved for inherently not codable concepts without codable children: Secondary | ICD-10-CM | POA: Diagnosis not present

## 2012-04-01 DIAGNOSIS — Z950 Presence of cardiac pacemaker: Secondary | ICD-10-CM | POA: Diagnosis not present

## 2012-04-01 DIAGNOSIS — Z79899 Other long term (current) drug therapy: Secondary | ICD-10-CM | POA: Diagnosis not present

## 2012-04-01 DIAGNOSIS — I4891 Unspecified atrial fibrillation: Secondary | ICD-10-CM | POA: Diagnosis not present

## 2012-04-03 DIAGNOSIS — R5383 Other fatigue: Secondary | ICD-10-CM | POA: Diagnosis not present

## 2012-04-03 DIAGNOSIS — S30860A Insect bite (nonvenomous) of lower back and pelvis, initial encounter: Secondary | ICD-10-CM | POA: Diagnosis not present

## 2012-04-03 DIAGNOSIS — R5381 Other malaise: Secondary | ICD-10-CM | POA: Diagnosis not present

## 2012-04-29 DIAGNOSIS — R209 Unspecified disturbances of skin sensation: Secondary | ICD-10-CM | POA: Diagnosis not present

## 2012-04-29 DIAGNOSIS — E039 Hypothyroidism, unspecified: Secondary | ICD-10-CM | POA: Diagnosis not present

## 2012-04-29 DIAGNOSIS — K219 Gastro-esophageal reflux disease without esophagitis: Secondary | ICD-10-CM | POA: Diagnosis not present

## 2012-04-29 DIAGNOSIS — R6889 Other general symptoms and signs: Secondary | ICD-10-CM | POA: Diagnosis not present

## 2012-04-29 DIAGNOSIS — R946 Abnormal results of thyroid function studies: Secondary | ICD-10-CM | POA: Diagnosis not present

## 2012-05-27 DIAGNOSIS — R0609 Other forms of dyspnea: Secondary | ICD-10-CM | POA: Diagnosis not present

## 2012-05-27 DIAGNOSIS — Z4502 Encounter for adjustment and management of automatic implantable cardiac defibrillator: Secondary | ICD-10-CM | POA: Diagnosis not present

## 2012-05-27 DIAGNOSIS — E782 Mixed hyperlipidemia: Secondary | ICD-10-CM | POA: Diagnosis not present

## 2012-05-27 DIAGNOSIS — I4891 Unspecified atrial fibrillation: Secondary | ICD-10-CM | POA: Diagnosis not present

## 2012-05-27 DIAGNOSIS — R0989 Other specified symptoms and signs involving the circulatory and respiratory systems: Secondary | ICD-10-CM | POA: Diagnosis not present

## 2012-05-28 DIAGNOSIS — I4891 Unspecified atrial fibrillation: Secondary | ICD-10-CM | POA: Diagnosis not present

## 2012-05-28 DIAGNOSIS — E782 Mixed hyperlipidemia: Secondary | ICD-10-CM | POA: Diagnosis not present

## 2012-05-28 DIAGNOSIS — I10 Essential (primary) hypertension: Secondary | ICD-10-CM | POA: Diagnosis not present

## 2012-05-28 DIAGNOSIS — Z950 Presence of cardiac pacemaker: Secondary | ICD-10-CM | POA: Diagnosis not present

## 2012-05-28 DIAGNOSIS — Z9289 Personal history of other medical treatment: Secondary | ICD-10-CM

## 2012-05-28 HISTORY — DX: Personal history of other medical treatment: Z92.89

## 2012-05-31 DIAGNOSIS — I5042 Chronic combined systolic (congestive) and diastolic (congestive) heart failure: Secondary | ICD-10-CM

## 2012-05-31 DIAGNOSIS — I519 Heart disease, unspecified: Secondary | ICD-10-CM | POA: Insufficient documentation

## 2012-05-31 DIAGNOSIS — I4891 Unspecified atrial fibrillation: Secondary | ICD-10-CM | POA: Diagnosis not present

## 2012-05-31 HISTORY — DX: Chronic combined systolic (congestive) and diastolic (congestive) heart failure: I50.42

## 2012-06-13 DIAGNOSIS — E119 Type 2 diabetes mellitus without complications: Secondary | ICD-10-CM | POA: Diagnosis not present

## 2012-06-13 DIAGNOSIS — I4891 Unspecified atrial fibrillation: Secondary | ICD-10-CM | POA: Diagnosis not present

## 2012-06-13 DIAGNOSIS — Z7901 Long term (current) use of anticoagulants: Secondary | ICD-10-CM | POA: Diagnosis not present

## 2012-06-23 DIAGNOSIS — I495 Sick sinus syndrome: Secondary | ICD-10-CM | POA: Diagnosis not present

## 2012-06-23 DIAGNOSIS — I441 Atrioventricular block, second degree: Secondary | ICD-10-CM | POA: Diagnosis not present

## 2012-06-23 DIAGNOSIS — I4891 Unspecified atrial fibrillation: Secondary | ICD-10-CM | POA: Diagnosis not present

## 2012-07-03 DIAGNOSIS — Z125 Encounter for screening for malignant neoplasm of prostate: Secondary | ICD-10-CM | POA: Diagnosis not present

## 2012-07-03 DIAGNOSIS — N401 Enlarged prostate with lower urinary tract symptoms: Secondary | ICD-10-CM | POA: Diagnosis not present

## 2012-07-08 DIAGNOSIS — D689 Coagulation defect, unspecified: Secondary | ICD-10-CM | POA: Diagnosis not present

## 2012-07-08 DIAGNOSIS — R5381 Other malaise: Secondary | ICD-10-CM | POA: Diagnosis not present

## 2012-07-08 DIAGNOSIS — Z79899 Other long term (current) drug therapy: Secondary | ICD-10-CM | POA: Diagnosis not present

## 2012-07-08 DIAGNOSIS — R5383 Other fatigue: Secondary | ICD-10-CM | POA: Diagnosis not present

## 2012-07-08 DIAGNOSIS — I4891 Unspecified atrial fibrillation: Secondary | ICD-10-CM | POA: Diagnosis not present

## 2012-07-08 DIAGNOSIS — I10 Essential (primary) hypertension: Secondary | ICD-10-CM | POA: Diagnosis not present

## 2012-07-09 ENCOUNTER — Encounter (HOSPITAL_COMMUNITY)
Admission: RE | Disposition: A | Discharge status: Home / Self Care | Payer: Self-pay | Source: Ambulatory Visit | Attending: Cardiovascular Disease

## 2012-07-09 ENCOUNTER — Encounter (HOSPITAL_COMMUNITY): Payer: Self-pay | Admitting: Anesthesiology

## 2012-07-09 ENCOUNTER — Encounter (HOSPITAL_COMMUNITY): Payer: Self-pay

## 2012-07-09 ENCOUNTER — Ambulatory Visit (HOSPITAL_COMMUNITY)
Admission: RE | Admit: 2012-07-09 | Discharge: 2012-07-09 | Disposition: A | Discharge status: Home / Self Care | Payer: Medicare Other | Source: Ambulatory Visit | Attending: Cardiovascular Disease | Admitting: Cardiovascular Disease

## 2012-07-09 ENCOUNTER — Ambulatory Visit (HOSPITAL_COMMUNITY): Payer: Medicare Other | Admitting: Anesthesiology

## 2012-07-09 DIAGNOSIS — I4891 Unspecified atrial fibrillation: Secondary | ICD-10-CM | POA: Diagnosis not present

## 2012-07-09 HISTORY — PX: CARDIOVERSION: SHX1299

## 2012-07-09 LAB — GLUCOSE, CAPILLARY: Glucose-Capillary: 123 mg/dL — ABNORMAL HIGH (ref 70–99)

## 2012-07-09 LAB — T4, FREE: Free T4: 1.12 ng/dL (ref 0.80–1.80)

## 2012-07-09 LAB — T3, FREE: T3, Free: 2.2 pg/mL — ABNORMAL LOW (ref 2.3–4.2)

## 2012-07-09 SURGERY — CARDIOVERSION
Anesthesia: Monitor Anesthesia Care

## 2012-07-09 MED ORDER — SODIUM CHLORIDE 0.9 % IV SOLN
INTRAVENOUS | Status: DC | PRN
  Administered 2012-07-09: 10:00:00 via INTRAVENOUS

## 2012-07-09 MED ORDER — PROPOFOL 10 MG/ML IV BOLUS
INTRAVENOUS | Status: DC | PRN
  Administered 2012-07-09: 70 mg via INTRAVENOUS

## 2012-07-09 NOTE — Transfer of Care (Signed)
Immediate Anesthesia Transfer of Care Note  Patient: Mason [name]  Procedure(s) Performed: Procedure(s) (LRB) with comments: CARDIOVERSION (N/A)  Patient Location: PACU  Anesthesia Type:General  Level of Consciousness: awake, alert  and oriented  Airway & Oxygen Therapy: Patient Spontanous Breathing and Patient connected to nasal cannula oxygen  Post-op Assessment: Report given to PACU RN and Post -op Vital signs reviewed and stable  Post vital signs: Reviewed and stable  Complications: No apparent anesthesia complications

## 2012-07-09 NOTE — Anesthesia Preprocedure Evaluation (Addendum)
Anesthesia Evaluation  Patient identified by MRN, date of birth, ID band Patient awake    Reviewed: Allergy & Precautions, H&P , NPO status , Patient's Chart, lab work & pertinent test results, reviewed documented beta blocker date and time   Airway Mallampati: II TM Distance: >3 FB Neck ROM: Full    Dental  (+) Teeth Intact   Pulmonary shortness of breath and with exertion,  breath sounds clear to auscultation        Cardiovascular hypertension, + dysrhythmias Atrial Fibrillation + pacemaker Rhythm:Regular Rate:Normal     Neuro/Psych PSYCHIATRIC DISORDERS Depression    GI/Hepatic GERD-  Controlled,  Endo/Other  diabetes  Renal/GU      Musculoskeletal   Abdominal   Peds  Hematology   Anesthesia Other Findings   Reproductive/Obstetrics                         Anesthesia Physical Anesthesia Plan  ASA: III  Anesthesia Plan: General   Post-op Pain Management:    Induction:   Airway Management Planned: Mask  Additional Equipment:   Intra-op Plan:   Post-operative Plan:   Informed Consent: I have reviewed the patients History and Physical, chart, labs and discussed the procedure including the risks, benefits and alternatives for the proposed anesthesia with the patient or authorized representative who has indicated his/her understanding and acceptance.     Plan Discussed with: CRNA and Surgeon  Anesthesia Plan Comments:         Anesthesia Quick Evaluation

## 2012-07-09 NOTE — H&P (Signed)
Date of Initial H&P: 07/08/12  History reviewed, patient examined, no change in status, stable for surgery. 72 yo with complete heart block and permanent pacemaker with symptomatic atrial fibrillation, here for elective cardioversion. This procedure has been fully reviewed with the patient and written informed consent has been obtained. Thurmon Fair, MD, [facility] [phone] office [phone] pager

## 2012-07-09 NOTE — CV Procedure (Signed)
Poulter,Koy R Male, 71 y.o., 07/30/1940  Location: [facility]  Bed: NONE  MRN: [id number]  CSN: [id number]  Admit Dt: 07/09/12  Procedure: Electrical Cardioversion Indications:  Atrial Fibrillation  Procedure Details:  Consent: Risks of procedure as well as the alternatives and risks of each were explained to the (patient/caregiver).  Consent for procedure obtained.  Time Out: Verified patient identification, verified procedure, site/side was marked, verified correct patient position, special equipment/implants available, medications/allergies/relevent history reviewed, required imaging and test results available.  Performed  Patient placed on cardiac monitor, pulse oximetry, supplemental oxygen as necessary.  Sedation given: propofol Pacer pads placed anterior and posterior chest.  Cardioverted 1 time(s).  Cardioverted at 120J synchronized biphasic  Evaluation: Findings: Post procedure EKG shows: atrial paced-ventricular paced rhythm; brief loss of ventricular capture immediately post shock (3 beats), then return to normal pacing. Device check shows normal function, including V pacing threshold 1V@0 .4 ms consistent with pre-shock parameters. Complications: None Patient did tolerate procedure well.  Time Spent Directly with the Patient:  30 minutes   Jaimon Bugaj 07/09/2012, 10:19 AM

## 2012-07-09 NOTE — Anesthesia Postprocedure Evaluation (Signed)
  Anesthesia Post-op Note  Patient: Mason [name]  Procedure(s) Performed: Procedure(s) (LRB) with comments: CARDIOVERSION (N/A)  Patient Location: Endoscopy Unit  Anesthesia Type:General  Level of Consciousness: awake, alert  and oriented  Airway and Oxygen Therapy: Patient Spontanous Breathing  Post-op Pain: none  Post-op Assessment: Post-op Vital signs reviewed and Patient's Cardiovascular Status Stable  Post-op Vital Signs: stable  Complications: No apparent anesthesia complications

## 2012-07-09 NOTE — Preoperative (Signed)
Beta Blockers   Reason not to administer Beta Blockers:Not Applicable 

## 2012-07-10 ENCOUNTER — Encounter (HOSPITAL_COMMUNITY): Payer: Self-pay | Admitting: Cardiovascular Disease

## 2012-07-10 DIAGNOSIS — N318 Other neuromuscular dysfunction of bladder: Secondary | ICD-10-CM | POA: Diagnosis not present

## 2012-07-10 DIAGNOSIS — N401 Enlarged prostate with lower urinary tract symptoms: Secondary | ICD-10-CM | POA: Diagnosis not present

## 2012-07-10 DIAGNOSIS — Z125 Encounter for screening for malignant neoplasm of prostate: Secondary | ICD-10-CM | POA: Diagnosis not present

## 2012-07-15 DIAGNOSIS — I4891 Unspecified atrial fibrillation: Secondary | ICD-10-CM | POA: Diagnosis not present

## 2012-07-15 DIAGNOSIS — I495 Sick sinus syndrome: Secondary | ICD-10-CM | POA: Diagnosis not present

## 2012-07-24 DIAGNOSIS — N200 Calculus of kidney: Secondary | ICD-10-CM

## 2012-07-24 HISTORY — DX: Calculus of kidney: N20.0

## 2012-08-06 DIAGNOSIS — I441 Atrioventricular block, second degree: Secondary | ICD-10-CM | POA: Diagnosis not present

## 2012-08-06 DIAGNOSIS — I10 Essential (primary) hypertension: Secondary | ICD-10-CM | POA: Diagnosis not present

## 2012-08-06 DIAGNOSIS — E119 Type 2 diabetes mellitus without complications: Secondary | ICD-10-CM | POA: Diagnosis not present

## 2012-08-06 DIAGNOSIS — I4891 Unspecified atrial fibrillation: Secondary | ICD-10-CM | POA: Diagnosis not present

## 2012-08-06 DIAGNOSIS — E7800 Pure hypercholesterolemia, unspecified: Secondary | ICD-10-CM | POA: Diagnosis not present

## 2012-08-06 DIAGNOSIS — E785 Hyperlipidemia, unspecified: Secondary | ICD-10-CM | POA: Diagnosis not present

## 2012-08-06 DIAGNOSIS — Z79899 Other long term (current) drug therapy: Secondary | ICD-10-CM | POA: Diagnosis not present

## 2012-08-06 DIAGNOSIS — E669 Obesity, unspecified: Secondary | ICD-10-CM | POA: Diagnosis not present

## 2012-08-06 DIAGNOSIS — IMO0001 Reserved for inherently not codable concepts without codable children: Secondary | ICD-10-CM | POA: Diagnosis not present

## 2012-08-09 DIAGNOSIS — H40029 Open angle with borderline findings, high risk, unspecified eye: Secondary | ICD-10-CM | POA: Diagnosis not present

## 2012-08-09 DIAGNOSIS — H40059 Ocular hypertension, unspecified eye: Secondary | ICD-10-CM | POA: Diagnosis not present

## 2012-08-09 DIAGNOSIS — Z961 Presence of intraocular lens: Secondary | ICD-10-CM | POA: Diagnosis not present

## 2012-08-13 ENCOUNTER — Emergency Department (HOSPITAL_BASED_OUTPATIENT_CLINIC_OR_DEPARTMENT_OTHER): Payer: Medicare Other

## 2012-08-13 ENCOUNTER — Encounter (HOSPITAL_BASED_OUTPATIENT_CLINIC_OR_DEPARTMENT_OTHER): Payer: Self-pay

## 2012-08-13 ENCOUNTER — Emergency Department (HOSPITAL_BASED_OUTPATIENT_CLINIC_OR_DEPARTMENT_OTHER)
Admission: EM | Admit: 2012-08-13 | Discharge: 2012-08-13 | Disposition: A | Discharge status: Home / Self Care | Payer: Medicare Other | Attending: Emergency Medicine | Admitting: Emergency Medicine

## 2012-08-13 DIAGNOSIS — N201 Calculus of ureter: Secondary | ICD-10-CM | POA: Diagnosis not present

## 2012-08-13 DIAGNOSIS — E119 Type 2 diabetes mellitus without complications: Secondary | ICD-10-CM | POA: Insufficient documentation

## 2012-08-13 DIAGNOSIS — F3289 Other specified depressive episodes: Secondary | ICD-10-CM | POA: Insufficient documentation

## 2012-08-13 DIAGNOSIS — K219 Gastro-esophageal reflux disease without esophagitis: Secondary | ICD-10-CM | POA: Diagnosis not present

## 2012-08-13 DIAGNOSIS — I498 Other specified cardiac arrhythmias: Secondary | ICD-10-CM | POA: Diagnosis not present

## 2012-08-13 DIAGNOSIS — I10 Essential (primary) hypertension: Secondary | ICD-10-CM | POA: Insufficient documentation

## 2012-08-13 DIAGNOSIS — N200 Calculus of kidney: Secondary | ICD-10-CM | POA: Diagnosis not present

## 2012-08-13 DIAGNOSIS — M129 Arthropathy, unspecified: Secondary | ICD-10-CM | POA: Insufficient documentation

## 2012-08-13 DIAGNOSIS — M549 Dorsalgia, unspecified: Secondary | ICD-10-CM | POA: Diagnosis not present

## 2012-08-13 DIAGNOSIS — R112 Nausea with vomiting, unspecified: Secondary | ICD-10-CM | POA: Insufficient documentation

## 2012-08-13 DIAGNOSIS — N209 Urinary calculus, unspecified: Secondary | ICD-10-CM | POA: Diagnosis not present

## 2012-08-13 DIAGNOSIS — Z79899 Other long term (current) drug therapy: Secondary | ICD-10-CM | POA: Insufficient documentation

## 2012-08-13 DIAGNOSIS — Z791 Long term (current) use of non-steroidal anti-inflammatories (NSAID): Secondary | ICD-10-CM | POA: Diagnosis not present

## 2012-08-13 DIAGNOSIS — K2990 Gastroduodenitis, unspecified, without bleeding: Secondary | ICD-10-CM | POA: Diagnosis not present

## 2012-08-13 DIAGNOSIS — K2970 Gastritis, unspecified, without bleeding: Secondary | ICD-10-CM | POA: Diagnosis not present

## 2012-08-13 DIAGNOSIS — Z7901 Long term (current) use of anticoagulants: Secondary | ICD-10-CM | POA: Insufficient documentation

## 2012-08-13 DIAGNOSIS — F329 Major depressive disorder, single episode, unspecified: Secondary | ICD-10-CM | POA: Diagnosis not present

## 2012-08-13 LAB — URINE MICROSCOPIC-ADD ON

## 2012-08-13 LAB — CBC WITH DIFFERENTIAL/PLATELET
Basophils Absolute: 0 10*3/uL (ref 0.0–0.1)
Basophils Relative: 0 % (ref 0–1)
Eosinophils Absolute: 0.1 10*3/uL (ref 0.0–0.7)
Eosinophils Relative: 1 % (ref 0–5)
HCT: 43.9 % (ref 39.0–52.0)
Hemoglobin: 15.1 g/dL (ref 13.0–17.0)
Lymphocytes Relative: 14 % (ref 12–46)
Lymphs Abs: 0.8 10*3/uL (ref 0.7–4.0)
MCH: 29.3 pg (ref 26.0–34.0)
MCHC: 34.4 g/dL (ref 30.0–36.0)
MCV: 85.2 fL (ref 78.0–100.0)
Monocytes Absolute: 0.4 10*3/uL (ref 0.1–1.0)
Monocytes Relative: 7 % (ref 3–12)
Neutro Abs: 4.6 10*3/uL (ref 1.7–7.7)
Neutrophils Relative %: 78 % — ABNORMAL HIGH (ref 43–77)
Platelets: 154 10*3/uL (ref 150–400)
RBC: 5.15 MIL/uL (ref 4.22–5.81)
RDW: 13.3 % (ref 11.5–15.5)
WBC: 5.9 10*3/uL (ref 4.0–10.5)

## 2012-08-13 LAB — URINALYSIS, ROUTINE W REFLEX MICROSCOPIC
Bilirubin Urine: NEGATIVE
Glucose, UA: NEGATIVE mg/dL
Ketones, ur: NEGATIVE mg/dL
Nitrite: NEGATIVE
Protein, ur: NEGATIVE mg/dL
Specific Gravity, Urine: 1.02 (ref 1.005–1.030)
Urobilinogen, UA: 0.2 mg/dL (ref 0.0–1.0)
pH: 5.5 (ref 5.0–8.0)

## 2012-08-13 LAB — COMPREHENSIVE METABOLIC PANEL
ALT: 22 U/L (ref 0–53)
AST: 26 U/L (ref 0–37)
Albumin: 4 g/dL (ref 3.5–5.2)
Alkaline Phosphatase: 77 U/L (ref 39–117)
BUN: 26 mg/dL — ABNORMAL HIGH (ref 6–23)
CO2: 24 mEq/L (ref 19–32)
Calcium: 9.7 mg/dL (ref 8.4–10.5)
Chloride: 102 mEq/L (ref 96–112)
Creatinine, Ser: 1.3 mg/dL (ref 0.50–1.35)
GFR calc Af Amer: 63 mL/min — ABNORMAL LOW (ref >90)
GFR calc non Af Amer: 54 mL/min — ABNORMAL LOW (ref >90)
Glucose, Bld: 190 mg/dL — ABNORMAL HIGH (ref 70–99)
Potassium: 4.3 mEq/L (ref 3.5–5.1)
Sodium: 137 mEq/L (ref 135–145)
Total Bilirubin: 0.3 mg/dL (ref 0.3–1.2)
Total Protein: 7.5 g/dL (ref 6.0–8.3)

## 2012-08-13 MED ORDER — TAMSULOSIN HCL 0.4 MG PO CAPS: 0.4000 mg | ORAL_CAPSULE | Freq: Every day | ORAL | Status: DC

## 2012-08-13 MED ORDER — FENTANYL CITRATE 0.05 MG/ML IJ SOLN
50.0000 ug | Freq: Once | INTRAMUSCULAR | Status: AC
  Administered 2012-08-13: 50 ug via INTRAVENOUS
  Filled 2012-08-13: qty 2

## 2012-08-13 MED ORDER — ONDANSETRON HCL 4 MG PO TABS: 4.0000 mg | ORAL_TABLET | Freq: Four times a day (QID) | ORAL | Status: DC

## 2012-08-13 MED ORDER — HYDROCODONE-ACETAMINOPHEN 5-325 MG PO TABS: 2.0000 | ORAL_TABLET | ORAL | Status: DC | PRN

## 2012-08-13 NOTE — ED Notes (Signed)
Family at bedside. 

## 2012-08-13 NOTE — ED Provider Notes (Signed)
History     CSN: [id number]  Arrival date & time 08/13/12  4098   First MD Initiated Contact with Patient 08/13/12 [phone]      Chief Complaint  Patient presents with  . Flank Pain  . Emesis    (Consider location/radiation/quality/duration/timing/severity/associated sxs/prior treatment) HPI Comments: Patient presents with a one-week history of worsening pain in his left flank. He states it started in his left back and felt like it was musculoskeletal pain however it started radiating around toward his abdomen and got markedly worse this morning. He complains of a sharp pain it starts in his back and goes down toward his right lower quadrant and inguinal area. He denies any testicular pain. He denies any difficulty urinating but has had some dark colored urine. He's had some nausea and vomiting this morning. He denies any diarrhea or change in bowel habits. He denies any fevers or chills. He denies any history of kidney stones or other bowel problems.  Patient is a 72 y.o. male presenting with flank pain and vomiting.  Flank Pain Associated symptoms include abdominal pain. Pertinent negatives include no chest pain, no headaches and no shortness of breath.  Emesis  Associated symptoms include abdominal pain. Pertinent negatives include no arthralgias, no chills, no cough, no diarrhea, no fever and no headaches.    Past Medical History  Diagnosis Date  . Back pain   . Hypertension   . Diabetes mellitus   . Depression   . Dysrhythmia     bradycardia  . Shortness of breath   . 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 GERD (gastroesophageal reflux disease)   . Arthritis     Past Surgical History  Procedure Date  . Eye surgery   . Back surgery   . Insert / replace / remove pacemaker 02/05/2012    dual chamber  . Cardioversion 07/09/2012    Procedure: CARDIOVERSION;  Surgeon: Thurmon Fair, MD;  Location: [facility];  Service: Cardiovascular;  Laterality: N/A;    No family  history on file.  History  Substance Use Topics  . Smoking status: Never Smoker   . Smokeless tobacco: Never Used  . Alcohol Use: 3.6 oz/week    6 Cans of beer per week     Comment: 14 beers per week      Review of Systems  Constitutional: Negative for fever, chills, diaphoresis and fatigue.  HENT: Negative for congestion, rhinorrhea and sneezing.   Eyes: Negative.   Respiratory: Negative for cough, chest tightness and shortness of breath.   Cardiovascular: Negative for chest pain and leg swelling.  Gastrointestinal: Positive for nausea, vomiting and abdominal pain. Negative for diarrhea and blood in stool.  Genitourinary: Positive for flank pain. Negative for frequency, hematuria, decreased urine volume, difficulty urinating and testicular pain.  Musculoskeletal: Positive for back pain. Negative for arthralgias.  Skin: Negative for rash.  Neurological: Negative for dizziness, speech difficulty, weakness, numbness and headaches.    Allergies  Review of patient's allergies indicates no known allergies.  Home Medications   Current Outpatient Rx  Name  Route  Sig  Dispense  Refill  . ACETAMINOPHEN 325 MG PO TABS   Oral   Take 1-2 tablets (325-650 mg total) by mouth every 4 (four) hours as needed.         . CELECOXIB 200 MG PO CAPS   Oral   Take 200 mg by mouth daily as needed. For pain.         [name]  CINNAMON PO   Oral   Take 1 tablet by mouth daily.         [name] ESOMEPRAZOLE MAGNESIUM 40 MG PO CPDR   Oral   Take 40 mg by mouth 2 (two) times daily.         [name] HYDROCODONE-ACETAMINOPHEN 5-325 MG PO TABS   Oral   Take 2 tablets by mouth every 4 (four) hours as needed for pain.   15 tablet   0   . LOSARTAN POTASSIUM 100 MG PO TABS   Oral   Take 100 mg by mouth daily.         [name] MECLIZINE HCL 25 MG PO TABS   Oral   Take 25 mg by mouth 3 (three) times daily as needed. For vertigo.         [name] METFORMIN HCL 1000 MG PO TABS   Oral   Take 1 tablet (1,000 mg  total) by mouth 2 (two) times daily.           Resume 02/08/12   . ONDANSETRON HCL 4 MG PO TABS   Oral   Take 1 tablet (4 mg total) by mouth every 6 (six) hours.   12 tablet   0   . RIVAROXABAN 20 MG PO TABS   Oral   Take by mouth daily.         [name] ROPINIROLE HCL 1 MG PO TABS   Oral   Take 1 mg by mouth daily.         . SERTRALINE HCL 50 MG PO TABS   Oral   Take 50 mg by mouth daily.         [name] SIMVASTATIN 20 MG PO TABS   Oral   Take 20 mg by mouth every evening.         [name] SITAGLIPTIN PHOSPHATE 100 MG PO TABS   Oral   Take 100 mg by mouth daily.         [name] TAMSULOSIN HCL 0.4 MG PO CAPS   Oral   Take 1 capsule (0.4 mg total) by mouth daily.   10 capsule   0     BP 148/90  Pulse 75  Temp 97.6 F (36.4 C) (Oral)  Resp 16  SpO2 100%  Physical Exam  Constitutional: He is oriented to person, place, and time. He appears well-developed and well-nourished.  HENT:  Head: Normocephalic and atraumatic.  Eyes: Pupils are equal, round, and reactive to light.  Neck: Normal range of motion. Neck supple.  Cardiovascular: Normal rate, regular rhythm and normal heart sounds.   Pulmonary/Chest: Effort normal and breath sounds normal. No respiratory distress. He has no wheezes. He has no rales. He exhibits no tenderness.  Abdominal: Soft. Bowel sounds are normal. There is tenderness (moderate tenderness to the left midabdomen and left CVA area). There is no rebound and no guarding.  Musculoskeletal: Normal range of motion. He exhibits no edema.  Lymphadenopathy:    He has no cervical adenopathy.  Neurological: He is alert and oriented to person, place, and time.  Skin: Skin is warm and dry. No rash noted.  Psychiatric: He has a normal mood and affect.    ED Course  Procedures (including critical care time)  Results for orders placed during the hospital encounter of 08/13/12  URINALYSIS, ROUTINE W REFLEX MICROSCOPIC      Component Value Range   Color, Urine AMBER  (*) YELLOW   APPearance CLOUDY (*) CLEAR   Specific Gravity,  Urine 1.020  1.005 - 1.030   pH 5.5  5.0 - 8.0   Glucose, UA NEGATIVE  NEGATIVE mg/dL   Hgb urine dipstick LARGE (*) NEGATIVE   Bilirubin Urine NEGATIVE  NEGATIVE   Ketones, ur NEGATIVE  NEGATIVE mg/dL   Protein, ur NEGATIVE  NEGATIVE mg/dL   Urobilinogen, UA 0.2  0.0 - 1.0 mg/dL   Nitrite NEGATIVE  NEGATIVE   Leukocytes, UA TRACE (*) NEGATIVE  CBC WITH DIFFERENTIAL      Component Value Range   WBC 5.9  4.0 - 10.5 K/uL   RBC 5.15  4.22 - 5.81 MIL/uL   Hemoglobin 15.1  13.0 - 17.0 g/dL   HCT 16.1  09.6 - 04.5 %   MCV 85.2  78.0 - 100.0 fL   MCH 29.3  26.0 - 34.0 pg   MCHC 34.4  30.0 - 36.0 g/dL   RDW 40.9  81.1 - 91.4 %   Platelets 154  150 - 400 K/uL   Neutrophils Relative 78 (*) 43 - 77 %   Neutro Abs 4.6  1.7 - 7.7 K/uL   Lymphocytes Relative 14  12 - 46 %   Lymphs Abs 0.8  0.7 - 4.0 K/uL   Monocytes Relative 7  3 - 12 %   Monocytes Absolute 0.4  0.1 - 1.0 K/uL   Eosinophils Relative 1  0 - 5 %   Eosinophils Absolute 0.1  0.0 - 0.7 K/uL   Basophils Relative 0  0 - 1 %   Basophils Absolute 0.0  0.0 - 0.1 K/uL  COMPREHENSIVE METABOLIC PANEL      Component Value Range   Sodium 137  135 - 145 mEq/L   Potassium 4.3  3.5 - 5.1 mEq/L   Chloride 102  96 - 112 mEq/L   CO2 24  19 - 32 mEq/L   Glucose, Bld 190 (*) 70 - 99 mg/dL   BUN 26 (*) 6 - 23 mg/dL   Creatinine, Ser 7.82  0.50 - 1.35 mg/dL   Calcium 9.7  8.4 - 95.6 mg/dL   Total Protein 7.5  6.0 - 8.3 g/dL   Albumin 4.0  3.5 - 5.2 g/dL   AST 26  0 - 37 U/L   ALT 22  0 - 53 U/L   Alkaline Phosphatase 77  39 - 117 U/L   Total Bilirubin 0.3  0.3 - 1.2 mg/dL   GFR calc non Af Amer 54 (*) >90 mL/min   GFR calc Af Amer 63 (*) >90 mL/min  URINE MICROSCOPIC-ADD ON      Component Value Range   Squamous Epithelial / LPF RARE  RARE   WBC, UA 3-6  <3 WBC/hpf   RBC / HPF TOO NUMEROUS TO COUNT  <3 RBC/hpf   Bacteria, UA MANY (*) RARE   Ct Abdomen Pelvis Wo  Contrast  08/13/2012  *RADIOLOGY REPORT*  Clinical Data: Left flank pain, worsening.  Nausea and vomiting.  CT ABDOMEN AND PELVIS WITHOUT CONTRAST  Technique:  Multidetector CT imaging of the abdomen and pelvis was performed following the standard protocol without intravenous contrast.  Comparison: None.  Findings: Atrial and ventricular pacer leads noted.  Mild cardiomegaly.  There is evidence of old granulomatous disease with a calcified granuloma in the left lower lobe.  The visualized portion of the liver, spleen, pancreas, and adrenal glands appear unremarkable in noncontrast CT appearance.  The gallbladder and biliary system appear unremarkable.  No pathologic retroperitoneal or porta hepatis adenopathy is identified.  Appendix normal. No pathologic pelvic adenopathy is identified.  Mild fullness of the left collecting system noted with mild left hydroureter extending down to a 2 mm calculus probably in the intramural portion of the left UVJ, but conceivably a loose within the urinary bladder.  Right ureter unremarkable.  No additional renal calculus observed.  Urinary bladder wall thickening is probably secondary to nondistension.  Fatty left spermatic cord noted.  Subtle sclerosis along the sacral side of the right sacroiliac joint is observed.  Loss of intervertebral disc height noted at the L2-3 and L4-5, with endplate sclerosis at L2-3 and 4 mm of degenerative posterior subluxation of L2-3.  Mild left foraminal stenosis at L5-S1 is primarily secondary to facet arthropathy.  IMPRESSION:  1.  Mild left hydronephrosis and mild left hydroureter extending down to a 2 mm calculus probably in the intramural portion of the left UVJ, and less likely loose within the urinary bladder. 2. Mild cardiomegaly. 3.  Suspected small left inguinal hernia contains adipose tissue. 4.  Minimal right sacroiliitis. 5.  Lumbar spondylosis and degenerative disc disease, with suspected mild left foraminal stenosis at L5-S1.    Original Report Authenticated By: Gaylyn Rong, M.D.       1. Kidney stone       MDM  Patient with distal left ureteral stone. His creatinine is normal and his pain is controlled. He is afebrile and has no ongoing vomiting. He was discharged home with a prescription for Flomax and Vicodin. He was discharged with a urine strainer. He was advised to followup within the next few days with his urologist who is at [facility]. He was advised to return here if his symptoms worsen.        Rolan Bucco, MD 08/13/12 1012

## 2012-08-13 NOTE — ED Notes (Signed)
Patient transported to CT 

## 2012-08-13 NOTE — ED Notes (Signed)
Pt received Fentanyl 100 mcg IVP by EMS and pain decreased from 10/10 to 4/10

## 2012-08-13 NOTE — ED Notes (Signed)
MD at bedside. 

## 2012-08-13 NOTE — ED Notes (Addendum)
Pt reports left flank pain x 1 week worsening today.  He has had nausea and vomiting today.

## 2012-08-13 NOTE — ED Notes (Signed)
Pt reports left flank pain that started 1 week ago.  Pain has been intermittent until this am.  Pt describes pain as sever and associated with nausea and vomiting.

## 2012-08-14 DIAGNOSIS — Z23 Encounter for immunization: Secondary | ICD-10-CM | POA: Diagnosis not present

## 2012-08-15 LAB — URINE CULTURE: Colony Count: >=100000

## 2012-08-16 NOTE — ED Notes (Signed)
+   Urine Chart sent to EDP office for review. 

## 2012-08-20 DIAGNOSIS — N201 Calculus of ureter: Secondary | ICD-10-CM | POA: Diagnosis not present

## 2012-08-27 ENCOUNTER — Encounter: Payer: Self-pay | Admitting: *Deleted

## 2012-09-16 DIAGNOSIS — I10 Essential (primary) hypertension: Secondary | ICD-10-CM | POA: Diagnosis not present

## 2012-09-16 DIAGNOSIS — R062 Wheezing: Secondary | ICD-10-CM | POA: Diagnosis not present

## 2012-09-16 DIAGNOSIS — J069 Acute upper respiratory infection, unspecified: Secondary | ICD-10-CM | POA: Diagnosis not present

## 2012-09-26 DIAGNOSIS — J069 Acute upper respiratory infection, unspecified: Secondary | ICD-10-CM | POA: Diagnosis not present

## 2012-10-08 DIAGNOSIS — I4891 Unspecified atrial fibrillation: Secondary | ICD-10-CM | POA: Diagnosis not present

## 2012-10-08 DIAGNOSIS — E782 Mixed hyperlipidemia: Secondary | ICD-10-CM | POA: Diagnosis not present

## 2012-10-08 DIAGNOSIS — Z7901 Long term (current) use of anticoagulants: Secondary | ICD-10-CM | POA: Diagnosis not present

## 2012-10-15 DIAGNOSIS — M19019 Primary osteoarthritis, unspecified shoulder: Secondary | ICD-10-CM | POA: Diagnosis not present

## 2012-11-04 DIAGNOSIS — Z79899 Other long term (current) drug therapy: Secondary | ICD-10-CM | POA: Diagnosis not present

## 2012-11-04 DIAGNOSIS — E669 Obesity, unspecified: Secondary | ICD-10-CM | POA: Diagnosis not present

## 2012-11-04 DIAGNOSIS — Z6832 Body mass index (BMI) 32.0-32.9, adult: Secondary | ICD-10-CM | POA: Diagnosis not present

## 2012-11-04 DIAGNOSIS — IMO0001 Reserved for inherently not codable concepts without codable children: Secondary | ICD-10-CM | POA: Diagnosis not present

## 2012-11-04 DIAGNOSIS — Z713 Dietary counseling and surveillance: Secondary | ICD-10-CM | POA: Diagnosis not present

## 2012-11-04 DIAGNOSIS — E782 Mixed hyperlipidemia: Secondary | ICD-10-CM | POA: Diagnosis not present

## 2012-11-04 DIAGNOSIS — Z7182 Exercise counseling: Secondary | ICD-10-CM | POA: Diagnosis not present

## 2012-11-04 DIAGNOSIS — I10 Essential (primary) hypertension: Secondary | ICD-10-CM | POA: Diagnosis not present

## 2012-11-11 DIAGNOSIS — I441 Atrioventricular block, second degree: Secondary | ICD-10-CM | POA: Diagnosis not present

## 2012-11-11 DIAGNOSIS — I4891 Unspecified atrial fibrillation: Secondary | ICD-10-CM | POA: Diagnosis not present

## 2012-11-11 DIAGNOSIS — I495 Sick sinus syndrome: Secondary | ICD-10-CM | POA: Diagnosis not present

## 2012-11-11 LAB — PACEMAKER DEVICE OBSERVATION

## 2012-11-18 ENCOUNTER — Emergency Department (HOSPITAL_BASED_OUTPATIENT_CLINIC_OR_DEPARTMENT_OTHER)
Admission: EM | Admit: 2012-11-18 | Discharge: 2012-11-18 | Disposition: A | Discharge status: Home / Self Care | Payer: Medicare Other | Attending: Emergency Medicine | Admitting: Emergency Medicine

## 2012-11-18 ENCOUNTER — Encounter (HOSPITAL_BASED_OUTPATIENT_CLINIC_OR_DEPARTMENT_OTHER): Payer: Self-pay | Admitting: *Deleted

## 2012-11-18 DIAGNOSIS — Z8679 Personal history of other diseases of the circulatory system: Secondary | ICD-10-CM | POA: Insufficient documentation

## 2012-11-18 DIAGNOSIS — Z950 Presence of cardiac pacemaker: Secondary | ICD-10-CM | POA: Insufficient documentation

## 2012-11-18 DIAGNOSIS — Z8739 Personal history of other diseases of the musculoskeletal system and connective tissue: Secondary | ICD-10-CM | POA: Insufficient documentation

## 2012-11-18 DIAGNOSIS — F3289 Other specified depressive episodes: Secondary | ICD-10-CM | POA: Insufficient documentation

## 2012-11-18 DIAGNOSIS — F329 Major depressive disorder, single episode, unspecified: Secondary | ICD-10-CM | POA: Insufficient documentation

## 2012-11-18 DIAGNOSIS — S51809A Unspecified open wound of unspecified forearm, initial encounter: Secondary | ICD-10-CM | POA: Insufficient documentation

## 2012-11-18 DIAGNOSIS — S51831A Puncture wound without foreign body of right forearm, initial encounter: Secondary | ICD-10-CM

## 2012-11-18 DIAGNOSIS — E119 Type 2 diabetes mellitus without complications: Secondary | ICD-10-CM | POA: Diagnosis not present

## 2012-11-18 DIAGNOSIS — W268XXA Contact with other sharp object(s), not elsewhere classified, initial encounter: Secondary | ICD-10-CM | POA: Insufficient documentation

## 2012-11-18 DIAGNOSIS — I10 Essential (primary) hypertension: Secondary | ICD-10-CM | POA: Diagnosis not present

## 2012-11-18 DIAGNOSIS — K219 Gastro-esophageal reflux disease without esophagitis: Secondary | ICD-10-CM | POA: Insufficient documentation

## 2012-11-18 DIAGNOSIS — Z79899 Other long term (current) drug therapy: Secondary | ICD-10-CM | POA: Diagnosis not present

## 2012-11-18 DIAGNOSIS — Y9389 Activity, other specified: Secondary | ICD-10-CM | POA: Insufficient documentation

## 2012-11-18 DIAGNOSIS — Y929 Unspecified place or not applicable: Secondary | ICD-10-CM | POA: Insufficient documentation

## 2012-11-18 MED ORDER — CEPHALEXIN 500 MG PO CAPS: 500.0000 mg | ORAL_CAPSULE | Freq: Four times a day (QID) | ORAL | Status: DC

## 2012-11-18 MED ORDER — LIDOCAINE-EPINEPHRINE 2 %-1:100000 IJ SOLN
INTRAMUSCULAR | Status: AC
  Filled 2012-11-18: qty 1

## 2012-11-18 MED ORDER — CEPHALEXIN 250 MG PO CAPS
250.0000 mg | ORAL_CAPSULE | Freq: Once | ORAL | Status: AC
  Administered 2012-11-18: 250 mg via ORAL
  Filled 2012-11-18: qty 1

## 2012-11-18 MED ORDER — HYDROCODONE-ACETAMINOPHEN 5-325 MG PO TABS: 1.0000 | ORAL_TABLET | Freq: Four times a day (QID) | ORAL | Status: DC | PRN

## 2012-11-18 MED ORDER — HYDROCODONE-ACETAMINOPHEN 5-325 MG PO TABS
1.0000 | ORAL_TABLET | Freq: Once | ORAL | Status: DC
  Filled 2012-11-18: qty 1

## 2012-11-18 MED ORDER — LIDOCAINE-EPINEPHRINE (PF) 2 %-1:200000 IJ SOLN
10.0000 mL | Freq: Once | INTRAMUSCULAR | Status: DC
  Filled 2012-11-18: qty 10

## 2012-11-18 NOTE — ED Notes (Signed)
Puncture wound right arm with a nail while building a deck. Bleeding controlled with pressure.

## 2012-11-18 NOTE — ED Provider Notes (Signed)
History     CSN: [id number]  Arrival date & time 11/18/12  1642   First MD Initiated Contact with Patient 11/18/12 1736      Chief Complaint  Patient presents with  . Arm Injury    (Consider location/radiation/quality/duration/timing/severity/associated sxs/prior treatment) HPI  72 year old male with history of diabetes and history of atrial fibrillation currently taking Xarelto presents for evaluations of puncture wound to right arm. Pt report he was working on a deck today, and accidentally punctured his R forearm into an exposed nail on a piece of wood.  The incident happened 5 hrs ago.  Initially pain was minimal but as the day progress his pain has also progressed.  Described as sharp, throbbing, radiates down to R hand.  Pain worsen with movement or with palpation.  Bleeding is controlled.  Pt has tried irrigate wound and apply hydrocortisone cream as well.  Is UTD with tetanus.  Denies numbness.  NO fever or rash.    Past Medical History  Diagnosis Date  . Back pain   . Hypertension   . Diabetes mellitus   . Depression   . Dysrhythmia     bradycardia  . Shortness of breath   . 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 GERD (gastroesophageal reflux disease)   . Arthritis   . Afib     DCCV 07/09/13-converted, lasted two days, then back into afib  . Echocardiogram abnormal 05/31/12    EF 40-45%, LA mod-severe dilated, AFIB  . History of cardiovascular stress test 05/28/12    no ischemia, EF 37%, imaging results are unchanged and within normal variance  . H/O tilt table evaluation 11/02/05    negative    Past Surgical History  Procedure Laterality Date  . Eye surgery    . Back surgery    . Insert / replace / remove pacemaker  02/05/2012    dual chamber, sinus node dysfunction, sinus arrest, PAF, Medtronic Revo serial#[id number]  . Cardioversion  07/09/2012    Procedure: CARDIOVERSION;  Surgeon: Thurmon Fair, MD;  Location:  [facility];  Service: Cardiovascular;  Laterality: N/A;    Family History  Problem Relation Age of Onset  . Heart failure Mother   . Stroke Mother   . Stroke Father     History  Substance Use Topics  . Smoking status: Never Smoker   . Smokeless tobacco: Never Used  . Alcohol Use: 3.6 oz/week    6 Cans of beer per week     Comment: 14 beers per week      Review of Systems  Constitutional: Negative for fever.  Skin: Positive for wound.  Neurological: Negative for numbness.    Allergies  Review of patient's allergies indicates no known allergies.  Home Medications   Current Outpatient Rx  Name  Route  Sig  Dispense  Refill  . acetaminophen (TYLENOL) 325 MG tablet   Oral   Take 1-2 tablets (325-650 mg total) by mouth every 4 (four) hours as needed.         [name] amiodarone (PACERONE) 400 MG tablet   Oral   Take 400 mg by mouth daily.         . celecoxib (CELEBREX) 200 MG capsule   Oral   Take 200 mg by mouth daily as needed. For pain.         [name] CINNAMON PO   Oral   Take 1 tablet by mouth daily.         [name]  esomeprazole (NEXIUM) 40 MG capsule   Oral   Take 40 mg by mouth 2 (two) times daily.         [name] HYDROcodone-acetaminophen (NORCO/VICODIN) 5-325 MG per tablet   Oral   Take 2 tablets by mouth every 4 (four) hours as needed for pain.   15 tablet   0   . losartan (COZAAR) 100 MG tablet   Oral   Take 100 mg by mouth daily.         . meclizine (ANTIVERT) 25 MG tablet   Oral   Take 25 mg by mouth 3 (three) times daily as needed. For vertigo.         . metFORMIN (GLUCOPHAGE) 1000 MG tablet   Oral   Take 1 tablet (1,000 mg total) by mouth 2 (two) times daily.           Resume 02/08/12   . ondansetron (ZOFRAN) 4 MG tablet   Oral   Take 1 tablet (4 mg total) by mouth every 6 (six) hours.   12 tablet   0   . Rivaroxaban (XARELTO) 20 MG TABS   Oral   Take by mouth daily.         [name] rOPINIRole (REQUIP) 1 MG tablet   Oral   Take 1 mg  by mouth daily.         . sertraline (ZOLOFT) 50 MG tablet   Oral   Take 50 mg by mouth daily.         . simvastatin (ZOCOR) 20 MG tablet   Oral   Take 20 mg by mouth every evening.         . sitaGLIPtin (JANUVIA) 100 MG tablet   Oral   Take 100 mg by mouth daily.         . Tamsulosin HCl (FLOMAX) 0.4 MG CAPS   Oral   Take 1 capsule (0.4 mg total) by mouth daily.   10 capsule   0     BP 116/78  Pulse 79  Temp(Src) 98.9 F (37.2 C) (Oral)  Resp 18  Wt 217 lb (98.431 kg)  BMI 30.28 kg/m2  SpO2 98%  Physical Exam  Nursing note and vitals reviewed. Constitutional: He appears well-developed and well-nourished. No distress.  HENT:  Head: Atraumatic.  Eyes: Conjunctivae are normal.  Neck: Neck supple.  Cardiovascular: Intact distal pulses.   Musculoskeletal: He exhibits tenderness (R forearm: small puncture wound noted to volar aspect of mid forearm with moderate tenderness to palpation.  no fb noted, not actively bleeding, no ecchymosis, or swelling.  scab noted over wound.  tenderness to each finger flexion but maintain FROM.).  Neurological:  Sensation intact distal to wound.    Skin: No rash noted.    ED Course  Procedures (including critical care time)  5:48 PM Pt with small puncture wound to R forearm.  Reports nail was not rusty.  Does take blood thinner medication but not actively bleeding.  No fb seen or palpated.  Does have moderate tenderness to palpation.  Since wound is small, it is difficult to irrigate.  No retained fb seen.  i offer xray, pt declined.  Pt is NVI.  Plan to prescribe pain medication and abx.  Hand specialist referral given.  Return precaution discussed.    i did performed localized injection with lidocaine and perform irrigation.  No wound closure as it increased risk of infection.  Care discussed with attending.    Labs Reviewed - No data  to display No results found.   1. Puncture wound of right forearm, initial encounter        MDM  BP 116/78  Pulse 79  Temp(Src) 98.9 F (37.2 C) (Oral)  Resp 18  Wt 217 lb (98.431 kg)  BMI 30.28 kg/m2  SpO2 98%         Fayrene Helper, PA-C 11/18/12 1817

## 2012-11-18 NOTE — ED Provider Notes (Signed)
Medical screening examination/treatment/procedure(s) were performed by non-physician practitioner and as supervising physician I was immediately available for consultation/collaboration.  Ethelda Chick, MD 11/18/12 Rickey Primus

## 2012-11-19 DIAGNOSIS — Y92009 Unspecified place in unspecified non-institutional (private) residence as the place of occurrence of the external cause: Secondary | ICD-10-CM | POA: Diagnosis not present

## 2012-11-19 DIAGNOSIS — S56909A Unspecified injury of unspecified muscles, fascia and tendons at forearm level, unspecified arm, initial encounter: Secondary | ICD-10-CM | POA: Diagnosis not present

## 2012-11-19 DIAGNOSIS — W268XXA Contact with other sharp object(s), not elsewhere classified, initial encounter: Secondary | ICD-10-CM | POA: Diagnosis not present

## 2012-11-19 DIAGNOSIS — IMO0002 Reserved for concepts with insufficient information to code with codable children: Secondary | ICD-10-CM | POA: Diagnosis not present

## 2012-11-19 DIAGNOSIS — S51809A Unspecified open wound of unspecified forearm, initial encounter: Secondary | ICD-10-CM | POA: Diagnosis not present

## 2012-11-29 ENCOUNTER — Ambulatory Visit (INDEPENDENT_AMBULATORY_CARE_PROVIDER_SITE_OTHER): Payer: Self-pay | Admitting: General Surgery

## 2012-12-11 DIAGNOSIS — S40269A Insect bite (nonvenomous) of unspecified shoulder, initial encounter: Secondary | ICD-10-CM | POA: Diagnosis not present

## 2012-12-11 DIAGNOSIS — L909 Atrophic disorder of skin, unspecified: Secondary | ICD-10-CM | POA: Diagnosis not present

## 2012-12-11 DIAGNOSIS — M545 Low back pain, unspecified: Secondary | ICD-10-CM | POA: Diagnosis not present

## 2012-12-11 DIAGNOSIS — A6920 Lyme disease, unspecified: Secondary | ICD-10-CM | POA: Diagnosis not present

## 2012-12-11 DIAGNOSIS — W57XXXA Bitten or stung by nonvenomous insect and other nonvenomous arthropods, initial encounter: Secondary | ICD-10-CM | POA: Diagnosis not present

## 2012-12-11 DIAGNOSIS — S30860A Insect bite (nonvenomous) of lower back and pelvis, initial encounter: Secondary | ICD-10-CM | POA: Diagnosis not present

## 2012-12-13 ENCOUNTER — Other Ambulatory Visit: Payer: Self-pay | Admitting: *Deleted

## 2012-12-13 MED ORDER — RIVAROXABAN 20 MG PO TABS: 20.0000 mg | ORAL_TABLET | Freq: Every day | ORAL | Status: DC

## 2012-12-13 NOTE — Telephone Encounter (Signed)
Electronic refill for xarelto

## 2013-01-03 ENCOUNTER — Ambulatory Visit: Payer: Medicare Other | Admitting: Family Medicine

## 2013-01-03 ENCOUNTER — Encounter: Payer: Self-pay | Admitting: Family Medicine

## 2013-01-03 VITALS — BP 149/93 | HR 72 | Temp 97.5°F | Resp 16 | Ht 70.0 in | Wt 219.8 lb

## 2013-01-03 DIAGNOSIS — E785 Hyperlipidemia, unspecified: Secondary | ICD-10-CM

## 2013-01-03 DIAGNOSIS — Z7901 Long term (current) use of anticoagulants: Secondary | ICD-10-CM

## 2013-01-03 DIAGNOSIS — E119 Type 2 diabetes mellitus without complications: Secondary | ICD-10-CM

## 2013-01-03 DIAGNOSIS — I4891 Unspecified atrial fibrillation: Secondary | ICD-10-CM

## 2013-01-03 DIAGNOSIS — I10 Essential (primary) hypertension: Secondary | ICD-10-CM

## 2013-01-03 LAB — COMPREHENSIVE METABOLIC PANEL
ALT: 17 U/L (ref 0–53)
AST: 23 U/L (ref 0–37)
Albumin: 3.8 g/dL (ref 3.5–5.2)
Alkaline Phosphatase: 58 U/L (ref 39–117)
BUN: 20 mg/dL (ref 6–23)
CO2: 25 mEq/L (ref 19–32)
Calcium: 9.8 mg/dL (ref 8.4–10.5)
Chloride: 107 mEq/L (ref 96–112)
Creatinine, Ser: 1 mg/dL (ref 0.4–1.5)
GFR: 76.64 mL/min (ref >60.00)
Glucose, Bld: 111 mg/dL — ABNORMAL HIGH (ref 70–99)
Potassium: 4.8 mEq/L (ref 3.5–5.1)
Sodium: 141 mEq/L (ref 135–145)
Total Bilirubin: 0.6 mg/dL (ref 0.3–1.2)
Total Protein: 6.6 g/dL (ref 6.0–8.3)

## 2013-01-03 LAB — LIPID PANEL
Cholesterol: 120 mg/dL (ref 0–200)
HDL: 43.7 mg/dL (ref >39.00)
LDL Cholesterol: 56 mg/dL (ref 0–99)
Total CHOL/HDL Ratio: 3
Triglycerides: 104 mg/dL (ref 0.0–149.0)
VLDL: 20.8 mg/dL (ref 0.0–40.0)

## 2013-01-03 LAB — CBC WITH DIFFERENTIAL/PLATELET
Basophils Absolute: 0 10*3/uL (ref 0.0–0.1)
Basophils Relative: 0.4 % (ref 0.0–3.0)
Eosinophils Absolute: 0.2 10*3/uL (ref 0.0–0.7)
Eosinophils Relative: 3.3 % (ref 0.0–5.0)
HCT: 43.2 % (ref 39.0–52.0)
Hemoglobin: 14.2 g/dL (ref 13.0–17.0)
Lymphocytes Relative: 27.6 % (ref 12.0–46.0)
Lymphs Abs: 1.3 10*3/uL (ref 0.7–4.0)
MCHC: 32.9 g/dL (ref 30.0–36.0)
MCV: 93.1 fl (ref 78.0–100.0)
Monocytes Absolute: 0.4 10*3/uL (ref 0.1–1.0)
Monocytes Relative: 8.7 % (ref 3.0–12.0)
Neutro Abs: 2.8 10*3/uL (ref 1.4–7.7)
Neutrophils Relative %: 60 % (ref 43.0–77.0)
Platelets: 178 10*3/uL (ref 150.0–400.0)
RBC: 4.64 Mil/uL (ref 4.22–5.81)
RDW: 14.7 % — ABNORMAL HIGH (ref 11.5–14.6)
WBC: 4.7 10*3/uL (ref 4.5–10.5)

## 2013-01-03 LAB — HEMOGLOBIN A1C: Hgb A1c MFr Bld: 6.7 % — ABNORMAL HIGH (ref 4.6–6.5)

## 2013-01-03 MED ORDER — HYDROCODONE-ACETAMINOPHEN 10-325 MG PO TABS: ORAL_TABLET | ORAL | Status: DC

## 2013-01-22 ENCOUNTER — Encounter: Payer: Self-pay | Admitting: Family Medicine

## 2013-01-22 ENCOUNTER — Ambulatory Visit (INDEPENDENT_AMBULATORY_CARE_PROVIDER_SITE_OTHER): Payer: Medicare Other | Admitting: Family Medicine

## 2013-01-22 VITALS — BP 149/94 | HR 79 | Temp 97.8°F | Resp 18 | Ht 70.0 in | Wt 220.0 lb

## 2013-01-22 DIAGNOSIS — M545 Low back pain, unspecified: Secondary | ICD-10-CM | POA: Diagnosis not present

## 2013-01-22 HISTORY — DX: Low back pain, unspecified: M54.50

## 2013-01-22 NOTE — Assessment & Plan Note (Signed)
Improved with brief course of narcotics, rest, stretching. He has a big component of musculoskeletal pain chronically, usually at low level.  However, also some intermittent acute MS strain and discogenic-type pain without radiculopathy. Discussed conservative mgmt with prn celebrex, regular low back stretching and core strengthening exercises, prn use of his norco if pain is severe, return if any new/worsening sx's. 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

## 2013-01-22 NOTE — Progress Notes (Signed)
OFFICE NOTE  01/22/2013  CC:  Chief Complaint  Patient presents with  . Hypertension  . Diabetes     HPI: Patient is a 72 y.o. Caucasian male who is here for 3 wk f/u from his initial visit to establish care, mainly to discuss his back pain.   His pain is much improved; just some bilat lower lumbar pain over the iliac crest regions bilat, esp when he rises from sitting position and when rolling over in bed.  He required norco for about 10d but has not used this med in over a week. When standing and walking he is fine.  Also some left hamstring and calf "stretching" and "quivering" type pain over the last week, present only when sitting or lying down, goes completely away when standing and walking.  No swelling of his leg.  No paresthesias or weakness in legs.  No radiation of pain from LB/buttocks down into leg.  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   Pertinent PMH:  Past Medical History  Diagnosis Date  . Chronic low back pain   . Hypertension   . Diabetes mellitus   . Depression   . Dysrhythmia     bradycardia  . Shortness of breath   . 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 GERD (gastroesophageal reflux disease)   . Arthritis   . Afib     DCCV 07/09/13-converted, lasted two days, then back into afib  . Echocardiogram abnormal 05/31/12    EF 40-45%, LA mod-severe dilated, AFIB  . History of cardiovascular stress test 05/28/12    no ischemia, EF 37%, imaging results are unchanged and within normal variance  . H/O tilt table evaluation 11/02/05    negative   Past Surgical History  Procedure Laterality Date  . Eye surgery    . Back surgery  1976  . Insert / replace / remove pacemaker  02/05/2012    dual chamber, sinus node dysfunction, sinus arrest, PAF, Medtronic Revo serial#-[id number]  . Cardioversion  07/09/2012    Procedure:  CARDIOVERSION;  Surgeon: Thurmon Fair, MD;  Location: [facility];  Service: Cardiovascular;  Laterality: N/A;    MEDS:  Outpatient Prescriptions Prior to Visit  Medication Sig Dispense Refill  . acetaminophen (TYLENOL) 325 MG tablet Take 1-2 tablets (325-650 mg total) by mouth every 4 (four) hours as needed.      . celecoxib (CELEBREX) 200 MG capsule Take 200 mg by mouth daily as needed. For pain.      . Cholecalciferol (VITAMIN D3) 2000 UNITS TABS Take 2,000 Units by mouth daily.      [name] CINNAMON PO Take 1,000 mg by mouth daily.       [name] esomeprazole (NEXIUM) 40 MG capsule Take 40 mg by mouth 2 (two) times daily.      [name] HYDROcodone-acetaminophen (NORCO) 10-325 MG per tablet 1-2 tabs po twice per day as needed for pain in low back  60 tablet  0  . losartan (COZAAR) 100 MG tablet Take 100 mg by mouth daily.      . meclizine (ANTIVERT) 25 MG tablet Take 25 mg by mouth 3 (three) times daily as needed. For vertigo.      . metFORMIN (GLUCOPHAGE) 1000 MG tablet Take 1 tablet (1,000 mg total) by mouth 2 (two) times daily.      [name]  Rivaroxaban (XARELTO) 20 MG TABS Take 1 tablet (20 mg total) by mouth daily.  90 tablet  1  . rOPINIRole (REQUIP) 1 MG tablet Take 1 mg by mouth daily.      . sertraline (ZOLOFT) 50 MG tablet Take 50 mg by mouth daily.      . simvastatin (ZOCOR) 20 MG tablet Take 20 mg by mouth every evening.      . sitaGLIPtin (JANUVIA) 100 MG tablet Take 100 mg by mouth daily.      . vitamin B-12 (CYANOCOBALAMIN) 500 MCG tablet Take 500 mcg by mouth daily.       No facility-administered medications prior to visit.    PE: Blood pressure 149/94, pulse 79, temperature 97.8 F (36.6 C), temperature source Oral, resp. rate 18, height 5\' 10"  (1.778 m), weight 220 lb (99.791 kg), SpO2 98.00%. Gen: Alert, well appearing.  Patient is oriented to person, place, time, and situation. BACK: ROM fully intact.  No tenderness in LB.  Hips with full ROM bilat, minimal pain in central L/S spine  region with FABER maneuver.  No lateral hip TTP. Supine SLR neg bilat.  No hamstring or calf TTP.  No LE swelling, erythema, or edema.  No leg tenderness to palpation.  Leg strength 5/5 prox and dist bilat.  DTRs 2+ in patellar regions bilat, 1+ in achilles bilat.  IMPRESSION AND PLAN:  Episodic low back pain Improved with brief course of narcotics, rest, stretching. He has a big component of musculoskeletal pain chronically, usually at low level.  However, also some intermittent acute MS strain and discogenic-type pain without radiculopathy. Discussed conservative mgmt with prn celebrex, regular low back stretching and core strengthening exercises, prn use of his norco if pain is severe, return if any new/worsening sx's. 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   An After Visit Summary was printed and given to the patient.  FOLLOW UP: 69mo f/u DM 2 and HTN

## 2013-02-03 ENCOUNTER — Ambulatory Visit: Payer: Medicare Other | Admitting: Family Medicine

## 2013-02-11 ENCOUNTER — Other Ambulatory Visit: Payer: Self-pay | Admitting: Family Medicine

## 2013-02-11 MED ORDER — ROPINIROLE HCL 1 MG PO TABS: 1.0000 mg | ORAL_TABLET | Freq: Every day | ORAL | Status: DC

## 2013-02-12 ENCOUNTER — Other Ambulatory Visit: Payer: Self-pay | Admitting: Cardiovascular Disease

## 2013-02-12 DIAGNOSIS — I4891 Unspecified atrial fibrillation: Secondary | ICD-10-CM | POA: Diagnosis not present

## 2013-02-12 DIAGNOSIS — I498 Other specified cardiac arrhythmias: Secondary | ICD-10-CM | POA: Diagnosis not present

## 2013-02-14 ENCOUNTER — Encounter: Payer: Self-pay | Admitting: Family Medicine

## 2013-02-18 ENCOUNTER — Encounter: Payer: Self-pay | Admitting: *Deleted

## 2013-02-18 LAB — REMOTE PACEMAKER DEVICE
AL IMPEDENCE PM: 520 Ohm
BATTERY VOLTAGE: 2.97 V
BMOD-0001RV: LOW
BMOD-0002RV: 7
BMOD-0003RV: 30
BMOD-0004RV: 5
RV LEAD IMPEDENCE PM: 440 Ohm
VENTRICULAR PACING PM: 99.9

## 2013-02-19 DIAGNOSIS — Z961 Presence of intraocular lens: Secondary | ICD-10-CM | POA: Diagnosis not present

## 2013-02-19 DIAGNOSIS — E119 Type 2 diabetes mellitus without complications: Secondary | ICD-10-CM | POA: Diagnosis not present

## 2013-02-19 DIAGNOSIS — H02839 Dermatochalasis of unspecified eye, unspecified eyelid: Secondary | ICD-10-CM | POA: Diagnosis not present

## 2013-02-19 DIAGNOSIS — H4011X0 Primary open-angle glaucoma, stage unspecified: Secondary | ICD-10-CM | POA: Diagnosis not present

## 2013-02-26 ENCOUNTER — Other Ambulatory Visit: Payer: Self-pay

## 2013-02-27 ENCOUNTER — Ambulatory Visit: Payer: Medicare Other | Admitting: Cardiovascular Disease

## 2013-03-05 ENCOUNTER — Encounter: Payer: Self-pay | Admitting: Cardiovascular Disease

## 2013-03-05 ENCOUNTER — Ambulatory Visit (INDEPENDENT_AMBULATORY_CARE_PROVIDER_SITE_OTHER): Payer: Medicare Other | Admitting: Cardiovascular Disease

## 2013-03-05 VITALS — BP 134/80 | HR 79 | Ht 71.0 in | Wt 220.0 lb

## 2013-03-05 DIAGNOSIS — I4891 Unspecified atrial fibrillation: Secondary | ICD-10-CM | POA: Diagnosis not present

## 2013-03-05 DIAGNOSIS — I4430 Unspecified atrioventricular block: Secondary | ICD-10-CM | POA: Diagnosis not present

## 2013-03-05 DIAGNOSIS — Z950 Presence of cardiac pacemaker: Secondary | ICD-10-CM

## 2013-03-05 NOTE — Patient Instructions (Addendum)
Your physician recommends that you schedule a follow-up appointment in: 1 year  Continue remote pacemaker checks every 3 months.

## 2013-03-08 DIAGNOSIS — I4891 Unspecified atrial fibrillation: Secondary | ICD-10-CM | POA: Insufficient documentation

## 2013-03-08 NOTE — Progress Notes (Signed)
Patient ID: Mason [name], male   DOB: June 21, 1941, 72 y.o.   MRN: [id number]      Reason for office visit Permanent atrial fibrillation, complete heart block  Mason [name] is a remarkably active 72 year old gentleman who looks considerably younger than his stated age. He works hard every day. He sometimes complains of shortness of breath but he performs physical activity which would be considered intense intense for somebody 20-30 years younger than his age. 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 He has been in atrial fibrillation for roughly one year. Attempts at conversion to sinus rhythm while on treatment with multi-continue have been unsuccessful. He is now off antiarrhythmic therapy. His pacemaker shows 100% ventricular paced rhythm. His device is working well. He has been transmitting via the remote [employer] system.    No Known Allergies  Current Outpatient Prescriptions  Medication Sig Dispense Refill  . celecoxib (CELEBREX) 200 MG capsule Take 200 mg by mouth daily as needed. For pain.      . Cholecalciferol (VITAMIN D3) 2000 UNITS TABS Take 2,000 Units by mouth daily.      [name] CINNAMON PO Take 1,000 mg by mouth daily.       [name] esomeprazole (NEXIUM) 40 MG capsule Take 40 mg by mouth 2 (two) times daily.      [name] HYDROcodone-acetaminophen (NORCO) 10-325 MG per tablet 1-2 tabs po twice per day as needed for pain in low back  60 tablet  0  . losartan (COZAAR) 100 MG tablet Take 100 mg by mouth daily.      . meclizine (ANTIVERT) 25 MG tablet Take 25 mg by mouth 3 (three) times daily as needed. For vertigo.      . metFORMIN (GLUCOPHAGE) 1000 MG tablet Take 1 tablet (1,000 mg total) by mouth 2 (two) times daily.      . Rivaroxaban (XARELTO) 20 MG TABS Take 1 tablet (20 mg total) by mouth daily.  90 tablet  1  . rOPINIRole (REQUIP) 1 MG tablet Take 2 mg by mouth daily.      . sertraline (ZOLOFT) 50 MG tablet Take 50 mg by mouth daily.      . simvastatin (ZOCOR) 20 MG  tablet Take 20 mg by mouth every evening.      . sitaGLIPtin (JANUVIA) 100 MG tablet Take 100 mg by mouth daily.      . vitamin B-12 (CYANOCOBALAMIN) 500 MCG tablet Take 500 mcg by mouth daily.       No current facility-administered medications for this visit.    Past Medical History  Diagnosis Date  . Chronic low back pain   . Hypertension   . Diabetes mellitus   . Depression   . Dysrhythmia     bradycardia  . Shortness of breath   . 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 GERD (gastroesophageal reflux disease)   . Arthritis   . Afib     DCCV 07/09/13-converted, lasted two days, then back into afib  . Echocardiogram abnormal 05/31/12    EF 40-45%, LA mod-severe dilated, AFIB  . History of cardiovascular stress test 05/28/12    no ischemia, EF 37%, imaging results are unchanged and within normal variance  . H/O tilt table evaluation 11/02/05    negative  . BPH (benign prostatic hypertrophy) 06/2011    Irritative sx's; pt declined trial of anticholinergic per Urology records  . History of vertigo     +  Hx of posterior HA's.  Neuro (Dr. Sandria Manly) eval 2011.  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  [name] History of retinal detachment     OS  . History of chronic prostatitis     Past Surgical History  Procedure Laterality Date  . Eye surgery      retinal detachment OS+ cataract surgery  . Back surgery  1976  . Insert / replace / remove pacemaker  02/05/2012    dual chamber, sinus node dysfunction, sinus arrest, PAF, Medtronic Revo serial#[id number]  . Cardioversion  07/09/2012    Procedure: CARDIOVERSION;  Surgeon: Thurmon Fair, MD;  Location: [facility] ENDOSCOPY;  Service: Cardiovascular;  Laterality: N/A;  . Esophagogastroduodenoscopy  10/18/06    Done due to chronic GERD: Normal, bx showed no barrett's esophagus (Dr. Elnoria Howard)  . Colonoscopy w/ polypectomy  approx 2006;  repeat 09/2011    Polyps on 2013 EGD as well.  . Transthoracic echocardiogram  08/25/10    mild asymmetric LVH, normal systolic function, normal diastolic fxn, mild-to-mod mitral regurg, mild aortic valve sclerosis and trace AI, mild aortic root dilatation.  . Cardiovascular stress test  2012    nuclear perfusion study: low risk scan    Family History  Problem Relation Age of Onset  . Heart failure Mother   . Stroke Mother   . Stroke Father     History   Social History  . Marital Status: Married    Spouse Name: N/A    Number of Children: N/A  . Years of Education: N/A   Occupational History  . Not on file.   Social History Main Topics  . Smoking status: Never Smoker   . Smokeless tobacco: Never Used  . Alcohol Use: 7.2 oz/week    12 Cans of beer per week     Comment: 12 beers per week  . Drug Use: No  . Sexual Activity: Not on file   Other Topics Concern  . Not on file   Social History Narrative  . No narrative on file    Review of systems: The patient specifically denies any chest pain at rest or with exertion, dyspnea at rest, orthopnea, paroxysmal nocturnal dyspnea, syncope, palpitations, focal neurological deficits, intermittent claudication, lower extremity edema, unexplained weight gain, cough, hemoptysis or wheezing.  The patient also denies abdominal pain, nausea, vomiting, dysphagia, diarrhea, constipation, polyuria, polydipsia, dysuria, hematuria, frequency, urgency, abnormal bleeding or bruising, fever, chills, unexpected weight changes, mood swings, change in skin or hair texture, change in voice quality, auditory or visual problems, allergic reactions or rashes, new musculoskeletal complaints other than usual "aches and pains".   PHYSICAL EXAM BP 134/80  Pulse 79  Ht 5\' 11"  (1.803 m)  Wt 220 lb (99.791 kg)  BMI 30.7 kg/m2  General: Alert, oriented x3, no distress Head: no evidence of trauma, PERRL, EOMI, no exophtalmos or lid lag, no myxedema, no  xanthelasma; normal ears, nose and oropharynx Neck: normal jugular venous pulsations and no hepatojugular reflux; brisk carotid pulses without delay and no carotid bruits Chest: clear to auscultation, no signs of consolidation by percussion or palpation, normal fremitus, symmetrical and full respiratory excursions; of the left subclavian pacemaker site Cardiovascular: normal position and quality of the apical impulse, regular rhythm, normal first and paradoxically second heart sounds, no murmurs, rubs or gallops Abdomen: no tenderness or distention, no masses by palpation, no abnormal pulsatility or arterial bruits, normal bowel sounds, no hepatosplenomegaly Extremities: no clubbing, cyanosis or edema; 2+  radial, ulnar and brachial pulses bilaterally; 2+ right femoral, posterior tibial and dorsalis pedis pulses; 2+ left femoral, posterior tibial and dorsalis pedis pulses; no subclavian or femoral bruits Neurological: grossly nonfocal   EKG: Atrial fibrillation, ventricular paced rhythm  Lipid Panel     Component Value Date/Time   CHOL 120 01/03/2013 0944   TRIG 104.0 01/03/2013 0944   HDL 43.70 01/03/2013 0944   CHOLHDL 3 01/03/2013 0944   VLDL 20.8 01/03/2013 0944   LDLCALC 56 01/03/2013 0944    BMET    Component Value Date/Time   NA 141 01/03/2013 0944   K 4.8 01/03/2013 0944   CL 107 01/03/2013 0944   CO2 25 01/03/2013 0944   GLUCOSE 111* 01/03/2013 0944   BUN 20 01/03/2013 0944   CREATININE 1.0 01/03/2013 0944   CALCIUM 9.8 01/03/2013 0944   GFRNONAA 54* 08/13/2012 0915   GFRAA 63* 08/13/2012 0915     ASSESSMENT AND PLAN Atrial fibrillation, permanent Failed attempt at cardioversion despite concomitant antiarrhythmic therapy. The arrhythmia is really not symptomatic. He is extremely active even for somebody much younger than his own age. I do not think any further attempts at resolution of atrial fibrillation oriented, although we did briefly discuss ablation therapy. He should remain on  lifelong anticoagulation barring serious hemorrhagic complications.  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 No R waves could be detected at this last pacemaker check. He should be considered pacemaker dependent. Notes a mildly depressed left ventricular systolic function with an ejection fraction of 40-45% by echocardiography. If he should ever develop symptoms of congestive heart failure there is the option to upgrade his device to a cardiac resynchronization pacemaker (biventricular pacemaker).  Cardiac pacemaker 02/05/12 Dual-chamber Medtronic Revo MRI conditional device was implanted for high-grade second-degree atrioventricular block. Normal remote [place] device check about a month ago. No episodes of ventricular tachyarrhythmia recorded. 100% ventricular pacing. Notes that the pacemaker sensor has recorded an average of 9.2 hours of activity every day. The device is programmed VVIR secondary permanent atrial fibrillation. There has not been any evidence of cessation of atrial fibrillation since last December and actually he has been essentially uninterrupted atrial fibrillation for a year now. Hold battery and lead parameters are within normal range. Heart rate histogram distribution is favorable.  No orders of the defined types were placed in this encounter.   Meds ordered this encounter  Medications  . rOPINIRole (REQUIP) 1 MG tablet    Sig: Take 2 mg by mouth daily.    Junious Silk, MD, [facility] [phone] office [phone] pager

## 2013-03-08 NOTE — Assessment & Plan Note (Addendum)
Dual-chamber Medtronic Revo MRI conditional device was implanted for high-grade second-degree atrioventricular block. Normal remote [place] device check about a month ago. No episodes of ventricular tachyarrhythmia recorded. 100% ventricular pacing. Notes that the pacemaker sensor has recorded an average of 9.2 hours of activity every day. The device is programmed VVIR secondary permanent atrial fibrillation. There has not been any evidence of cessation of atrial fibrillation since last December and actually he has been essentially uninterrupted atrial fibrillation for a year now. Hold battery and lead parameters are within normal range. Heart rate histogram distribution is favorable.

## 2013-03-08 NOTE — Assessment & Plan Note (Signed)
Failed attempt at cardioversion despite concomitant antiarrhythmic therapy. The arrhythmia is really not symptomatic. He is extremely active even for somebody much younger than his own age. I do not think any further attempts at resolution of atrial fibrillation oriented, although we did briefly discuss ablation therapy. He should remain on lifelong anticoagulation barring serious hemorrhagic complications.

## 2013-03-08 NOTE — Assessment & Plan Note (Addendum)
He had high-grade second-degree atrioventricular block when his pacemaker was implanted and this has probably progressed to complete heart block. No R waves could be detected at this last pacemaker check. He should be considered pacemaker dependent. Notes a mildly depressed left ventricular systolic function with an ejection fraction of 40-45% by echocardiography. If he should ever develop symptoms of congestive heart failure there is the option to upgrade his device to a cardiac resynchronization pacemaker (biventricular pacemaker).

## 2013-03-11 ENCOUNTER — Encounter: Payer: Self-pay | Admitting: Family Medicine

## 2013-03-19 ENCOUNTER — Other Ambulatory Visit: Payer: Self-pay | Admitting: Family Medicine

## 2013-03-19 MED ORDER — SERTRALINE HCL 50 MG PO TABS: 50.0000 mg | ORAL_TABLET | Freq: Every day | ORAL | Status: DC

## 2013-03-19 MED ORDER — METFORMIN HCL 1000 MG PO TABS: 1000.0000 mg | ORAL_TABLET | Freq: Two times a day (BID) | ORAL | Status: DC

## 2013-03-19 MED ORDER — SITAGLIPTIN PHOSPHATE 100 MG PO TABS: 100.0000 mg | ORAL_TABLET | Freq: Every day | ORAL | Status: DC

## 2013-03-19 MED ORDER — MECLIZINE HCL 25 MG PO TABS: 25.0000 mg | ORAL_TABLET | Freq: Three times a day (TID) | ORAL | Status: DC | PRN

## 2013-03-19 MED ORDER — LOSARTAN POTASSIUM 100 MG PO TABS: 100.0000 mg | ORAL_TABLET | Freq: Every day | ORAL | Status: DC

## 2013-03-19 MED ORDER — ESOMEPRAZOLE MAGNESIUM 40 MG PO CPDR: 40.0000 mg | DELAYED_RELEASE_CAPSULE | Freq: Two times a day (BID) | ORAL | Status: DC

## 2013-03-19 MED ORDER — SIMVASTATIN 20 MG PO TABS: 20.0000 mg | ORAL_TABLET | Freq: Every evening | ORAL | Status: DC

## 2013-04-01 ENCOUNTER — Ambulatory Visit (INDEPENDENT_AMBULATORY_CARE_PROVIDER_SITE_OTHER): Payer: Medicare Other | Admitting: Cardiovascular Disease

## 2013-04-01 ENCOUNTER — Encounter: Payer: Self-pay | Admitting: Cardiovascular Disease

## 2013-04-01 VITALS — BP 140/100 | HR 76 | Ht 70.0 in | Wt 224.1 lb

## 2013-04-01 DIAGNOSIS — Z950 Presence of cardiac pacemaker: Secondary | ICD-10-CM | POA: Diagnosis not present

## 2013-04-01 DIAGNOSIS — I4891 Unspecified atrial fibrillation: Secondary | ICD-10-CM

## 2013-04-01 DIAGNOSIS — E785 Hyperlipidemia, unspecified: Secondary | ICD-10-CM | POA: Diagnosis not present

## 2013-04-01 DIAGNOSIS — E119 Type 2 diabetes mellitus without complications: Secondary | ICD-10-CM

## 2013-04-01 NOTE — Patient Instructions (Addendum)
Your physician recommends that you schedule a follow-up appointment in: 1 YEAR. No changes were made today in your therapy. 

## 2013-04-21 ENCOUNTER — Encounter: Payer: Self-pay | Admitting: Cardiovascular Disease

## 2013-04-21 DIAGNOSIS — E785 Hyperlipidemia, unspecified: Secondary | ICD-10-CM | POA: Insufficient documentation

## 2013-04-21 DIAGNOSIS — E1169 Type 2 diabetes mellitus with other specified complication: Secondary | ICD-10-CM | POA: Insufficient documentation

## 2013-04-21 DIAGNOSIS — E669 Obesity, unspecified: Secondary | ICD-10-CM | POA: Insufficient documentation

## 2013-04-21 NOTE — Progress Notes (Signed)
Patient ID: Mason [name], male   DOB: 1940/08/16, 72 y.o.   MRN: [id number]     HPI: Mason [name], is a 72 y.o. male who presents to the office today for six-month cardiology evaluation.  Mason [name] has a history of high-grade second-degree AV block and status post permanent pacemaker insertion. He has a history of permanent atrial fibrillation. He is failed cardioversion with amiodarone and multaq therapy. Additional problems include type 2 diabetes mellitus, and hyperlipidemia. He denies bleeding at times he does admit to chest feels heavy but this typically is nonexertional. He denies associated diaphoresis or shortness of breath. He denies any palpitations. He denies presyncope or syncope.  Past Medical History  Diagnosis Date  . Chronic low back pain   . Hypertension   . Diabetes mellitus   . Depression   . Dysrhythmia     bradycardia  . Shortness of breath   . 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 GERD (gastroesophageal reflux disease)   . Arthritis   . Permanent atrial fibrillation     DCCV 07/09/13-converted, lasted two days, then back into afib  . Echocardiogram abnormal 05/31/12    EF 40-45%, LA mod-severe dilated, AFIB  . History of cardiovascular stress test 05/28/12    no ischemia, EF 37%, imaging results are unchanged and within normal variance  . H/O tilt table evaluation 11/02/05    negative  . BPH (benign prostatic hypertrophy) 06/2011    Irritative sx's; pt declined trial of anticholinergic per Urology records  . History of vertigo     + Hx of posterior HA's.  Neuro (Dr. Sandria Manly) eval 2011.  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  [name] History of retinal detachment     OS  . History of chronic prostatitis     Past Surgical History  Procedure Laterality Date  . Eye surgery      retinal detachment OS+ cataract surgery  . Back surgery  1976  .  Insert / replace / remove pacemaker  02/05/2012    dual chamber, sinus node dysfunction, sinus arrest, PAF, Medtronic Revo serial#[id number]  . Cardioversion  07/09/2012    Procedure: CARDIOVERSION;  Surgeon: Thurmon Fair, MD;  Location: [facility] ENDOSCOPY;  Service: Cardiovascular;  Laterality: N/A;  . Esophagogastroduodenoscopy  10/18/06    Done due to chronic GERD: Normal, bx showed no barrett's esophagus (Dr. Elnoria Howard)  . Colonoscopy w/ polypectomy  approx 2006; repeat 09/2011    Polyps on 2013 EGD as well.  . Transthoracic echocardiogram  08/25/10    mild asymmetric LVH, normal systolic function, normal diastolic fxn, mild-to-mod mitral regurg, mild aortic valve sclerosis and trace AI, mild aortic root dilatation.  . Cardiovascular stress test  2012    nuclear perfusion study: low risk scan    No Known Allergies  Current Outpatient Prescriptions  Medication Sig Dispense Refill  . celecoxib (CELEBREX) 200 MG capsule Take 200 mg by mouth daily as needed. For pain.      . Cholecalciferol (VITAMIN D3) 2000 UNITS TABS Take 2,000 Units by mouth daily.      [name] CINNAMON PO Take 1,000 mg by mouth daily.       [name] esomeprazole (NEXIUM) 40 MG capsule Take 1 capsule (40 mg total) by mouth 2 (two) times daily.  180 capsule  1  . HYDROcodone-acetaminophen (NORCO) 10-325 MG per tablet 1-2 tabs po twice per day as needed for  pain in low back  60 tablet  0  . losartan (COZAAR) 100 MG tablet Take 1 tablet (100 mg total) by mouth daily.  90 tablet  1  . meclizine (ANTIVERT) 25 MG tablet Take 1 tablet (25 mg total) by mouth 3 (three) times daily as needed. For vertigo.  90 tablet  1  . metFORMIN (GLUCOPHAGE) 1000 MG tablet Take 1 tablet (1,000 mg total) by mouth 2 (two) times daily.  180 tablet  1  . Rivaroxaban (XARELTO) 20 MG TABS Take 1 tablet (20 mg total) by mouth daily.  90 tablet  1  . rOPINIRole (REQUIP) 1 MG tablet Take 2 mg by mouth daily.      . sertraline (ZOLOFT) 50 MG tablet Take 1 tablet (50 mg total)  by mouth daily.  90 tablet  1  . simvastatin (ZOCOR) 20 MG tablet Take 1 tablet (20 mg total) by mouth every evening.  90 tablet  1  . sitaGLIPtin (JANUVIA) 100 MG tablet Take 1 tablet (100 mg total) by mouth daily.  90 tablet  1   No current facility-administered medications for this visit.    History   Social History  . Marital Status: Married    Spouse Name: N/A    Number of Children: N/A  . Years of Education: N/A   Occupational History  . Not on file.   Social History Main Topics  . Smoking status: Never Smoker   . Smokeless tobacco: Never Used  . Alcohol Use: 7.2 oz/week    12 Cans of beer per week     Comment: 12 beers per week  . Drug Use: No  . Sexual Activity: Not on file   Other Topics Concern  . Not on file   Social History Narrative  . No narrative on file    Family History  Problem Relation Age of Onset  . Heart failure Mother   . Stroke Mother   . Stroke Father     ROS is negative for fevers, chills or night sweats.   Other system review is negative.  PE BP 140/100  Pulse 76  Ht 5\' 10"  (1.778 m)  Wt 224 lb 1.6 oz (101.651 kg)  BMI 32.15 kg/m2  Repeat blood pressure is 122/88. General: Alert, oriented, no distress.  Skin: normal turgor, no rashes HEENT: Normocephalic, atraumatic. Pupils round and reactive; sclera anicteric;no lid lag.  Nose without nasal septal hypertrophy Mouth/Parynx benign; Mallinpatti scale 2/3 Neck: No JVD, no carotid briuts Lungs: clear to ausculatation and percussion; no wheezing or rales Heart: RRR, s1 s2 normal  no S3 Abdomen: soft, nontender; no hepatosplenomehaly, BS+; abdominal aorta nontender and not dilated by palpation. Pulses 2+ Extremities: no clubbing cyanosis or edema, Homan's sign negative  Neurologic: grossly nonfocal  ECG: Underlying atrial fibrillation with the  V-paced rhythm at 76 beats per minute  LABS:  BMET    Component Value Date/Time   NA 141 01/03/2013 0944   K 4.8 01/03/2013 0944   CL  107 01/03/2013 0944   CO2 25 01/03/2013 0944   GLUCOSE 111* 01/03/2013 0944   BUN 20 01/03/2013 0944   CREATININE 1.0 01/03/2013 0944   CALCIUM 9.8 01/03/2013 0944   GFRNONAA 54* 08/13/2012 0915   GFRAA 63* 08/13/2012 0915     Hepatic Function Panel     Component Value Date/Time   PROT 6.6 01/03/2013 0944   ALBUMIN 3.8 01/03/2013 0944   AST 23 01/03/2013 0944   ALT 17 01/03/2013 0944  ALKPHOS 58 01/03/2013 0944   BILITOT 0.6 01/03/2013 0944     CBC    Component Value Date/Time   WBC 4.7 01/03/2013 0944   RBC 4.64 01/03/2013 0944   HGB 14.2 01/03/2013 0944   HCT 43.2 01/03/2013 0944   PLT 178.0 01/03/2013 0944   MCV 93.1 01/03/2013 0944   MCH 29.3 08/13/2012 0915   MCHC 32.9 01/03/2013 0944   RDW 14.7* 01/03/2013 0944   LYMPHSABS 1.3 01/03/2013 0944   MONOABS 0.4 01/03/2013 0944   EOSABS 0.2 01/03/2013 0944   BASOSABS 0.0 01/03/2013 0944     BNP No results found for this basename: probnp    Lipid Panel     Component Value Date/Time   CHOL 120 01/03/2013 0944   TRIG 104.0 01/03/2013 0944   HDL 43.70 01/03/2013 0944   CHOLHDL 3 01/03/2013 0944   VLDL 20.8 01/03/2013 0944   LDLCALC 56 01/03/2013 0944     RADIOLOGY: No results found.    ASSESSMENT AND PLAN: Mason [name] is now 72 years old. He has a history of type 2 diabetes mellitus, permanent atrial fibrillation on Xarelto 20 mg for anticoagulation. Presently, he continues to be fairly stable. His last nuclear perfusion in the was done in November 2013 which was low risk and showed a mild apical defect. He currently is ventricular paced at 70 beats per minute. He's not having any chest pain. He's not having significant dyspnea appear his blood pressure is stable on his current therapy consisting of losartan 100 mg daily for he's on simvastatin for his hyperlipidemia. He is diabetic on metformin. I did review his recent laboratory from June 2014. Cholesterol 120 LDL cholesterol 56. As long as he remains stable, I will see him in one year for  followup evaluation    Lennette Bihari, MD, [facility]  04/21/2013 8:18 PM

## 2013-04-24 ENCOUNTER — Other Ambulatory Visit: Payer: Self-pay | Admitting: Cardiovascular Disease

## 2013-04-24 ENCOUNTER — Other Ambulatory Visit: Payer: Self-pay | Admitting: Family Medicine

## 2013-04-28 ENCOUNTER — Ambulatory Visit (INDEPENDENT_AMBULATORY_CARE_PROVIDER_SITE_OTHER): Payer: Medicare Other | Admitting: Family Medicine

## 2013-04-28 ENCOUNTER — Encounter: Payer: Self-pay | Admitting: Family Medicine

## 2013-04-28 VITALS — BP 155/102 | HR 76 | Temp 97.9°F | Resp 18 | Ht 70.0 in | Wt 225.0 lb

## 2013-04-28 DIAGNOSIS — N452 Orchitis: Secondary | ICD-10-CM | POA: Diagnosis not present

## 2013-04-28 DIAGNOSIS — M549 Dorsalgia, unspecified: Secondary | ICD-10-CM | POA: Diagnosis not present

## 2013-04-28 DIAGNOSIS — N453 Epididymo-orchitis: Secondary | ICD-10-CM

## 2013-04-28 MED ORDER — CIPROFLOXACIN HCL 500 MG PO TABS: 500.0000 mg | ORAL_TABLET | Freq: Two times a day (BID) | ORAL | Status: DC

## 2013-04-28 MED ORDER — OXYCODONE-ACETAMINOPHEN 5-325 MG PO TABS: ORAL_TABLET | ORAL | Status: DC

## 2013-04-28 NOTE — Progress Notes (Signed)
OFFICE NOTE  04/28/2013  CC:  Chief Complaint  Patient presents with  . Back Pain    Low right sided back pain x 4-5 days ago     HPI: Patient is a 72 y.o. Caucasian male who is here for low back pain. Onset about 5 d/a, pain in right LB without preceding event.  It is constant and moderate in LB, and occasionally into right groin/testicle region and extends down anteriomedial right thight to about the knee level.  No tingling in numbness in leg.  No loss of bowel/bladder control. With movement everthing is worse, sometimes to the intensity of 9/10. No urinary complaints.  No anal complaints.  Pertinent PMH:  Past Medical History  Diagnosis Date  . Chronic low back pain   . Hypertension   . Diabetes mellitus   . Depression   . Dysrhythmia     bradycardia  . Shortness of breath   . 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 GERD (gastroesophageal reflux disease)   . Arthritis   . Permanent atrial fibrillation     DCCV 07/09/13-converted, lasted two days, then back into afib  . Echocardiogram abnormal 05/31/12    EF 40-45%, LA mod-severe dilated, AFIB  . History of cardiovascular stress test 05/28/12    no ischemia, EF 37%, imaging results are unchanged and within normal variance  . H/O tilt table evaluation 11/02/05    negative  . BPH (benign prostatic hypertrophy) 06/2011    Irritative sx's; pt declined trial of anticholinergic per Urology records  . History of vertigo     + Hx of posterior HA's.  Neuro (Dr. Sandria Manly) eval 2011.  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  [name] History of retinal detachment     OS  . History of chronic prostatitis    Past Surgical History  Procedure Laterality Date  . Eye surgery      retinal detachment OS+ cataract surgery  . Back surgery  1976  . Insert / replace / remove pacemaker  02/05/2012    dual chamber, sinus node  dysfunction, sinus arrest, PAF, Medtronic Revo serial#[id number]  . Cardioversion  07/09/2012    Procedure: CARDIOVERSION;  Surgeon: Thurmon Fair, MD;  Location: [facility] ENDOSCOPY;  Service: Cardiovascular;  Laterality: N/A;  . Esophagogastroduodenoscopy  10/18/06    Done due to chronic GERD: Normal, bx showed no barrett's esophagus (Dr. Elnoria Howard)  . Colonoscopy w/ polypectomy  approx 2006; repeat 09/2011    Polyps on 2013 EGD as well.  . Transthoracic echocardiogram  08/25/10    mild asymmetric LVH, normal systolic function, normal diastolic fxn, mild-to-mod mitral regurg, mild aortic valve sclerosis and trace AI, mild aortic root dilatation.  . Cardiovascular stress test  2012    nuclear perfusion study: low risk scan   Past family and social history reviewed and there are no changes since the patient's last office visit with me.  MEDS:  Outpatient Prescriptions Prior to Visit  Medication Sig Dispense Refill  . celecoxib (CELEBREX) 200 MG capsule Take 200 mg by mouth daily as needed. For pain.      . Cholecalciferol (VITAMIN D3) 2000 UNITS TABS Take 2,000 Units by mouth daily.      [name] CINNAMON PO Take 1,000 mg by mouth daily.       [name] esomeprazole (NEXIUM) 40 MG capsule Take 1 capsule (40 mg total) by mouth 2 (two) times daily.  180  capsule  1  . HYDROcodone-acetaminophen (NORCO) 10-325 MG per tablet 1-2 tabs po twice per day as needed for pain in low back  60 tablet  0  . losartan (COZAAR) 100 MG tablet Take 1 tablet (100 mg total) by mouth daily.  90 tablet  1  . meclizine (ANTIVERT) 25 MG tablet Take 1 tablet (25 mg total) by mouth 3 (three) times daily as needed. For vertigo.  90 tablet  1  . metFORMIN (GLUCOPHAGE) 1000 MG tablet Take 1 tablet (1,000 mg total) by mouth 2 (two) times daily.  180 tablet  1  . rOPINIRole (REQUIP) 1 MG tablet TAKE 1 TABLET DAILY  90 tablet  0  . sertraline (ZOLOFT) 50 MG tablet Take 1 tablet (50 mg total) by mouth daily.  90 tablet  1  . simvastatin (ZOCOR) 20 MG  tablet Take 1 tablet (20 mg total) by mouth every evening.  90 tablet  1  . sitaGLIPtin (JANUVIA) 100 MG tablet Take 1 tablet (100 mg total) by mouth daily.  90 tablet  1  . XARELTO 20 MG TABS tablet TAKE 1 TABLET DAILY  90 tablet  1  . rOPINIRole (REQUIP) 1 MG tablet Take 2 mg by mouth daily.       No facility-administered medications prior to visit.    PE: Blood pressure 155/102, pulse 76, temperature 97.9 F (36.6 C), temperature source Temporal, resp. rate 18, height 5\' 10"  (1.778 m), weight 225 lb (102.059 kg), SpO2 98.00%. Gen: Alert, well appearing.  Patient is oriented to person, place, time, and situation. AFFECT: pleasant, lucid thought and speech. Back: No rash.  Mild TTP in lateral muscles/soft tissues of lower thoracic/upper lumbar region: no facet joint or spinous process tenderness. Hips: ROM normal, no pain or stiffness.   Sitting SLR neg.   Groin: no mass, no hernia, no LAD.  Sensation normal. Right testicle diffusely tender to palpation--mild.  No scrotal color change or rash.  Uncirc penis: foreskin retractable and normal.  IMPRESSION AND PLAN:  1) right mid/low back musculoskeletal pain.  2) Right epididymo-orchitis.  PLAN: cipro 500mg  bid x 14d.  Celebrex 200mg  qd x 12 samples (warning about potential increased risk of bleeding while taken with xarelto was discussed with pt--monitor).  Also, stop Norco since this is not effective for severe pain, and start trial of percocet 5/325, 1-2 q6h prn, #30, no RF.  Continue stretching, heat, relative rest.  FOLLOW UP: 14d

## 2013-04-30 ENCOUNTER — Encounter: Payer: Self-pay | Admitting: Family Medicine

## 2013-04-30 ENCOUNTER — Ambulatory Visit: Payer: Medicare Other | Admitting: Family Medicine

## 2013-04-30 ENCOUNTER — Ambulatory Visit (INDEPENDENT_AMBULATORY_CARE_PROVIDER_SITE_OTHER): Payer: Medicare Other | Admitting: Family Medicine

## 2013-04-30 VITALS — BP 158/108 | HR 75 | Temp 98.5°F | Resp 18 | Ht 70.0 in | Wt 225.0 lb

## 2013-04-30 DIAGNOSIS — M5430 Sciatica, unspecified side: Secondary | ICD-10-CM

## 2013-04-30 DIAGNOSIS — M5431 Sciatica, right side: Secondary | ICD-10-CM

## 2013-04-30 MED ORDER — PREDNISONE 20 MG PO TABS: ORAL_TABLET | ORAL | Status: DC

## 2013-04-30 NOTE — Assessment & Plan Note (Signed)
He has some sx's that point towards several lumbar levels and possibly S1 as well---I think this is sciatica rather than spondylosis/DDD with spinal nerve impingement. Will do trial of prednisone 40mg  qd x 5d.  Discussed possible increase in glucoses while on steroids. Stop cipro. Stop celebrex. He has percocet to use still if he has severe pain.

## 2013-04-30 NOTE — Progress Notes (Signed)
OFFICE NOTE  04/30/2013  CC:  Chief Complaint  Patient presents with  . Hip Pain    Right sided  . Leg Pain     HPI: Patient is a 72 y.o. Caucasian male who is here for pain in right hip region. I recently saw him for this a few days ago and he had testicular pain/tenderness with this so I started empiric treatment for epididymo-orchitis with cipro, gave celebrex samples, and percocet rx.  Reports now pain more consistent in right buttocks region and radiates down to about mid calf.  Also some tingling this morning in lateral right calf and bottom of foot.  No loss of bowel/bladder function. Still occ right groin pain/testicle pain occ.  No saddle anesthesia.  Denies focal weakness. Buttocks pain on right is constant, with the other pain/paresthesias being intermittent and brought on by movements.   Pertinent PMH:  Past Medical History  Diagnosis Date  . Chronic low back pain   . Hypertension   . Diabetes mellitus   . Depression   . Dysrhythmia     bradycardia  . Shortness of breath   . 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 GERD (gastroesophageal reflux disease)   . Arthritis   . Permanent atrial fibrillation     DCCV 07/09/13-converted, lasted two days, then back into afib  . Echocardiogram abnormal 05/31/12    EF 40-45%, LA mod-severe dilated, AFIB  . History of cardiovascular stress test 05/28/12    no ischemia, EF 37%, imaging results are unchanged and within normal variance  . H/O tilt table evaluation 11/02/05    negative  . BPH (benign prostatic hypertrophy) 06/2011    Irritative sx's; pt declined trial of anticholinergic per Urology records  . History of vertigo     + Hx of posterior HA's.  Neuro (Dr. Sandria Manly) eval 2011.  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  [name] History of retinal detachment     OS  . History of chronic prostatitis   . Lumbar  spondylosis     with DDD  . Nephrolithiasis 07/2012    Left UVJ 2 mm stone with dilation of renal collecting system and slight hydroureter on right   Past Surgical History  Procedure Laterality Date  . Eye surgery      retinal detachment OS+ cataract surgery  . Back surgery  1976  . Insert / replace / remove pacemaker  02/05/2012    dual chamber, sinus node dysfunction, sinus arrest, PAF, Medtronic Revo serial#[id number]  . Cardioversion  07/09/2012    Procedure: CARDIOVERSION;  Surgeon: Thurmon Fair, MD;  Location: [facility] ENDOSCOPY;  Service: Cardiovascular;  Laterality: N/A;  . Esophagogastroduodenoscopy  10/18/06    Done due to chronic GERD: Normal, bx showed no barrett's esophagus (Dr. Elnoria Howard)  . Colonoscopy w/ polypectomy  approx 2006; repeat 09/2011    Polyps on 2013 EGD as well.  . Transthoracic echocardiogram  08/25/10    mild asymmetric LVH, normal systolic function, normal diastolic fxn, mild-to-mod mitral regurg, mild aortic valve sclerosis and trace AI, mild aortic root dilatation.  . Cardiovascular stress test  2012    nuclear perfusion study: low risk scan    MEDS:  Outpatient Prescriptions Prior to Visit  Medication Sig Dispense Refill  . celecoxib (CELEBREX) 200 MG capsule Take 200 mg by mouth daily as needed. For pain.      . Cholecalciferol (VITAMIN D3) 2000  UNITS TABS Take 2,000 Units by mouth daily.      [name] CINNAMON PO Take 1,000 mg by mouth daily.       . ciprofloxacin (CIPRO) 500 MG tablet Take 1 tablet (500 mg total) by mouth 2 (two) times daily.  28 tablet  0  . esomeprazole (NEXIUM) 40 MG capsule Take 1 capsule (40 mg total) by mouth 2 (two) times daily.  180 capsule  1  . losartan (COZAAR) 100 MG tablet Take 1 tablet (100 mg total) by mouth daily.  90 tablet  1  . metFORMIN (GLUCOPHAGE) 1000 MG tablet Take 1 tablet (1,000 mg total) by mouth 2 (two) times daily.  180 tablet  1  . rOPINIRole (REQUIP) 1 MG tablet TAKE 1 TABLET DAILY  90 tablet  0  . sertraline  (ZOLOFT) 50 MG tablet Take 1 tablet (50 mg total) by mouth daily.  90 tablet  1  . simvastatin (ZOCOR) 20 MG tablet Take 1 tablet (20 mg total) by mouth every evening.  90 tablet  1  . sitaGLIPtin (JANUVIA) 100 MG tablet Take 1 tablet (100 mg total) by mouth daily.  90 tablet  1  . XARELTO 20 MG TABS tablet TAKE 1 TABLET DAILY  90 tablet  1  . meclizine (ANTIVERT) 25 MG tablet Take 1 tablet (25 mg total) by mouth 3 (three) times daily as needed. For vertigo.  90 tablet  1  . oxyCODONE-acetaminophen (PERCOCET/ROXICET) 5-325 MG per tablet 1-2 tabs po q6h prn severe pain  30 tablet  0   No facility-administered medications prior to visit.    PE: Blood pressure 158/108, pulse 75, temperature 98.5 F (36.9 C), temperature source Temporal, resp. rate 18, height 5\' 10"  (1.778 m), weight 225 lb (102.059 kg), SpO2 97.00%. Gen: Alert, well appearing.  Patient is oriented to person, place, time, and situation. CV: RRR  Chest is clear, no wheezing or rales. Normal symmetric air entry throughout both lung fields. No chest wall deformities or tenderness. Back: mild TTP in right paraspinous region (facet jt area + soft tissues) in lumbar levels.  No SI jt tenderness. Back ROM painful in flexion. Hips: nontender, ROM intact and without pain.  No SI joint pain elicited. Strength 5/5 prox and dist bilat in LE muscles.  No sensory deficits with monofilament testing. DTRs: left patellar 2+, left achilles 1+.  Right patellar 1+, right achilles trace. Sitting SLR neg on left.  On the right, extending the knee to 45 deg elicits right LB pain w/out radiation or paresthesias. Mild tenderness with palpation over the ischial tuberosity on the right.  IMPRESSION AND PLAN:  Right sided sciatica He has some sx's that point towards several lumbar levels and possibly S1 as well---I think this is sciatica rather than spondylosis/DDD with spinal nerve impingement. Will do trial of prednisone 40mg  qd x 5d.  Discussed  possible increase in glucoses while on steroids. Stop cipro. Stop celebrex. He has percocet to use still if he has severe pain.   An After Visit Summary was printed and given to the patient.  FOLLOW UP: 10d

## 2013-05-06 DIAGNOSIS — M545 Low back pain, unspecified: Secondary | ICD-10-CM | POA: Diagnosis not present

## 2013-05-07 ENCOUNTER — Encounter: Payer: Self-pay | Admitting: Cardiovascular Disease

## 2013-05-07 ENCOUNTER — Telehealth: Payer: Self-pay | Admitting: Family Medicine

## 2013-05-07 NOTE — Telephone Encounter (Signed)
Patient has seen his urologist Dr Harriette Bouillon. The doctor feels that there is nothing more he can do for the patient for his hip pain. Dr Harriette Bouillon recommended that the patient follow up with Dr. Milinda Cave to see if Dr. Milinda Cave feels like he should have an MRI or should he be in therapy? The patient feels like Dr. Harriette Bouillon thinks he should have an MRI. Please advise.

## 2013-05-07 NOTE — Telephone Encounter (Signed)
Please advise 

## 2013-05-12 ENCOUNTER — Emergency Department (HOSPITAL_BASED_OUTPATIENT_CLINIC_OR_DEPARTMENT_OTHER)
Admission: EM | Admit: 2013-05-12 | Discharge: 2013-05-12 | Disposition: A | Discharge status: Home / Self Care | Payer: Medicare Other | Attending: Emergency Medicine | Admitting: Emergency Medicine

## 2013-05-12 ENCOUNTER — Emergency Department (HOSPITAL_BASED_OUTPATIENT_CLINIC_OR_DEPARTMENT_OTHER): Payer: Medicare Other

## 2013-05-12 ENCOUNTER — Encounter: Payer: Self-pay | Admitting: Family Medicine

## 2013-05-12 ENCOUNTER — Encounter (HOSPITAL_BASED_OUTPATIENT_CLINIC_OR_DEPARTMENT_OTHER): Payer: Self-pay | Admitting: Emergency Medicine

## 2013-05-12 ENCOUNTER — Other Ambulatory Visit: Payer: Self-pay | Admitting: Family Medicine

## 2013-05-12 ENCOUNTER — Ambulatory Visit (INDEPENDENT_AMBULATORY_CARE_PROVIDER_SITE_OTHER): Payer: Medicare Other | Admitting: Family Medicine

## 2013-05-12 VITALS — BP 134/94 | HR 81 | Temp 97.6°F | Resp 18 | Ht 70.0 in | Wt 217.0 lb

## 2013-05-12 DIAGNOSIS — G8929 Other chronic pain: Secondary | ICD-10-CM | POA: Diagnosis not present

## 2013-05-12 DIAGNOSIS — M5410 Radiculopathy, site unspecified: Secondary | ICD-10-CM

## 2013-05-12 DIAGNOSIS — E119 Type 2 diabetes mellitus without complications: Secondary | ICD-10-CM | POA: Diagnosis not present

## 2013-05-12 DIAGNOSIS — F329 Major depressive disorder, single episode, unspecified: Secondary | ICD-10-CM | POA: Diagnosis not present

## 2013-05-12 DIAGNOSIS — IMO0002 Reserved for concepts with insufficient information to code with codable children: Secondary | ICD-10-CM | POA: Diagnosis not present

## 2013-05-12 DIAGNOSIS — Z950 Presence of cardiac pacemaker: Secondary | ICD-10-CM | POA: Insufficient documentation

## 2013-05-12 DIAGNOSIS — R209 Unspecified disturbances of skin sensation: Secondary | ICD-10-CM | POA: Diagnosis not present

## 2013-05-12 DIAGNOSIS — Z7901 Long term (current) use of anticoagulants: Secondary | ICD-10-CM | POA: Diagnosis not present

## 2013-05-12 DIAGNOSIS — Z8669 Personal history of other diseases of the nervous system and sense organs: Secondary | ICD-10-CM | POA: Insufficient documentation

## 2013-05-12 DIAGNOSIS — I10 Essential (primary) hypertension: Secondary | ICD-10-CM | POA: Insufficient documentation

## 2013-05-12 DIAGNOSIS — M129 Arthropathy, unspecified: Secondary | ICD-10-CM | POA: Diagnosis not present

## 2013-05-12 DIAGNOSIS — M47817 Spondylosis without myelopathy or radiculopathy, lumbosacral region: Secondary | ICD-10-CM | POA: Diagnosis not present

## 2013-05-12 DIAGNOSIS — Z9889 Other specified postprocedural states: Secondary | ICD-10-CM | POA: Insufficient documentation

## 2013-05-12 DIAGNOSIS — M5431 Sciatica, right side: Secondary | ICD-10-CM

## 2013-05-12 DIAGNOSIS — I4891 Unspecified atrial fibrillation: Secondary | ICD-10-CM | POA: Insufficient documentation

## 2013-05-12 DIAGNOSIS — M5430 Sciatica, unspecified side: Secondary | ICD-10-CM | POA: Diagnosis not present

## 2013-05-12 DIAGNOSIS — K219 Gastro-esophageal reflux disease without esophagitis: Secondary | ICD-10-CM | POA: Diagnosis not present

## 2013-05-12 DIAGNOSIS — Z87448 Personal history of other diseases of urinary system: Secondary | ICD-10-CM | POA: Insufficient documentation

## 2013-05-12 DIAGNOSIS — Z87442 Personal history of urinary calculi: Secondary | ICD-10-CM | POA: Diagnosis not present

## 2013-05-12 DIAGNOSIS — Z79899 Other long term (current) drug therapy: Secondary | ICD-10-CM | POA: Insufficient documentation

## 2013-05-12 DIAGNOSIS — F3289 Other specified depressive episodes: Secondary | ICD-10-CM | POA: Insufficient documentation

## 2013-05-12 DIAGNOSIS — R29898 Other symptoms and signs involving the musculoskeletal system: Secondary | ICD-10-CM

## 2013-05-12 LAB — URINALYSIS, ROUTINE W REFLEX MICROSCOPIC
Bilirubin Urine: NEGATIVE
Glucose, UA: NEGATIVE mg/dL
Hgb urine dipstick: NEGATIVE
Ketones, ur: NEGATIVE mg/dL
Leukocytes, UA: NEGATIVE
Nitrite: NEGATIVE
Protein, ur: NEGATIVE mg/dL
Specific Gravity, Urine: 1.028 (ref 1.005–1.030)
Urobilinogen, UA: 0.2 mg/dL (ref 0.0–1.0)
pH: 5 (ref 5.0–8.0)

## 2013-05-12 MED ORDER — OXYCODONE-ACETAMINOPHEN 5-325 MG PO TABS: ORAL_TABLET | ORAL | Status: DC

## 2013-05-12 MED ORDER — ONDANSETRON 4 MG PO TBDP
4.0000 mg | ORAL_TABLET | Freq: Once | ORAL | Status: AC
  Administered 2013-05-12: 4 mg via ORAL
  Filled 2013-05-12: qty 1

## 2013-05-12 MED ORDER — HYDROMORPHONE HCL PF 2 MG/ML IJ SOLN
2.0000 mg | Freq: Once | INTRAMUSCULAR | Status: AC
  Administered 2013-05-12: 2 mg via INTRAMUSCULAR
  Filled 2013-05-12: qty 1

## 2013-05-12 MED ORDER — OXYCODONE HCL ER 10 MG PO T12A: 10.0000 mg | EXTENDED_RELEASE_TABLET | Freq: Two times a day (BID) | ORAL | Status: DC

## 2013-05-12 NOTE — Progress Notes (Signed)
OFFICE NOTE  05/12/2013  CC:  Chief Complaint  Patient presents with  . Follow-up     HPI: Patient is a 72 y.o. Caucasian male who is here for f/u acute mechanical LBP with radiation down right leg + paresthesias. Pain got worse so he went to ED early this morning.  Xray L/S spine showed diffuse deg changes.  He was given a shot of dilaudid and a rx for oxycontin.  He has not filled the rx for oxycontin.  He has only been taking 1 oxycodone bid.  He does not having some weakness in right leg now.  No saddle anesthesia, no loss of bowel or bladder function.   The pain radiates from right LB to glut region and then down anterior leg into right groin and genital region. Also some paresthesias down entire right leg at times.  Pt does have an orthopedist in [place] named Dr. Marlyne Beards that he can see if needed.  Pertinent PMH:  Past Medical History  Diagnosis Date  . Chronic low back pain   . Hypertension   . Diabetes mellitus   . Depression   . Dysrhythmia     bradycardia  . Shortness of breath   . 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 GERD (gastroesophageal reflux disease)   . Arthritis   . Permanent atrial fibrillation     DCCV 07/09/13-converted, lasted two days, then back into afib  . Echocardiogram abnormal 05/31/12    EF 40-45%, LA mod-severe dilated, AFIB  . History of cardiovascular stress test 05/28/12    no ischemia, EF 37%, imaging results are unchanged and within normal variance  . H/O tilt table evaluation 11/02/05    negative  . BPH (benign prostatic hypertrophy) 06/2011    Irritative sx's; pt declined trial of anticholinergic per Urology records  . History of vertigo     + Hx of posterior HA's.  Neuro (Dr. Sandria Manly) eval 2011.  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  [name] History of retinal detachment     OS  . History of chronic prostatitis   . Lumbar  spondylosis     with DDD  . Nephrolithiasis 07/2012    Left UVJ 2 mm stone with dilation of renal collecting system and slight hydroureter on right   Past Surgical History  Procedure Laterality Date  . Eye surgery      retinal detachment OS+ cataract surgery  . Back surgery  1976  . Insert / replace / remove pacemaker  02/05/2012    dual chamber, sinus node dysfunction, sinus arrest, PAF, Medtronic Revo serial#[id number]  . Cardioversion  07/09/2012    Procedure: CARDIOVERSION;  Surgeon: Thurmon Fair, MD;  Location: [facility] ENDOSCOPY;  Service: Cardiovascular;  Laterality: N/A;  . Esophagogastroduodenoscopy  10/18/06    Done due to chronic GERD: Normal, bx showed no barrett's esophagus (Dr. Elnoria Howard)  . Colonoscopy w/ polypectomy  approx 2006; repeat 09/2011    Polyps on 2013 EGD as well.  . Transthoracic echocardiogram  08/25/10    mild asymmetric LVH, normal systolic function, normal diastolic fxn, mild-to-mod mitral regurg, mild aortic valve sclerosis and trace AI, mild aortic root dilatation.  . Cardiovascular stress test  2012    nuclear perfusion study: low risk scan    MEDS:  Outpatient Prescriptions Prior to Visit  Medication Sig Dispense Refill  . Cholecalciferol (VITAMIN D3) 2000 UNITS TABS Take 2,000 Units by mouth  daily.      [name] CINNAMON PO Take 1,000 mg by mouth daily.       [name] esomeprazole (NEXIUM) 40 MG capsule Take 1 capsule (40 mg total) by mouth 2 (two) times daily.  180 capsule  1  . losartan (COZAAR) 100 MG tablet Take 1 tablet (100 mg total) by mouth daily.  90 tablet  1  . meclizine (ANTIVERT) 25 MG tablet Take 1 tablet (25 mg total) by mouth 3 (three) times daily as needed. For vertigo.  90 tablet  1  . metFORMIN (GLUCOPHAGE) 1000 MG tablet Take 1 tablet (1,000 mg total) by mouth 2 (two) times daily.  180 tablet  1  . rOPINIRole (REQUIP) 1 MG tablet TAKE 1 TABLET DAILY  90 tablet  0  . sertraline (ZOLOFT) 50 MG tablet Take 1 tablet (50 mg total) by mouth daily.  90 tablet   1  . simvastatin (ZOCOR) 20 MG tablet Take 1 tablet (20 mg total) by mouth every evening.  90 tablet  1  . sitaGLIPtin (JANUVIA) 100 MG tablet Take 1 tablet (100 mg total) by mouth daily.  90 tablet  1  . XARELTO 20 MG TABS tablet TAKE 1 TABLET DAILY  90 tablet  1  . oxyCODONE-acetaminophen (PERCOCET/ROXICET) 5-325 MG per tablet 1-2 tabs po q6h prn severe pain  30 tablet  0  . predniSONE (DELTASONE) 20 MG tablet 2 tabs po qd x 5d  10 tablet  0   No facility-administered medications prior to visit.    PE: Blood pressure 134/94, pulse 81, temperature 97.6 F (36.4 C), temperature source Temporal, resp. rate 18, height 5\' 10"  (1.778 m), weight 217 lb (98.431 kg), SpO2 99.00%. Gen: Alert, well appearing.  Patient is oriented to person, place, time, and situation. ROM: lumbar flexion limited to 80 deg.  All other ROM intact but extension and lateral bending to the right do cause LBP. TTP in right lumbar facet region and a little peripheral to this as well--approx mid lumbsar down to prox sacral region involved. Mild TTP in right glut region.  Hip ROM normal w/out pain. Sitting SLR + on right at 45 deg, neg on left. Right leg proximal weakness: 4+/5 compared to 5/5 on left, otherwise LE strength fully intact and symmetric. LE DTRs: Right patellar reflex 1+, left is 2+.  Both achilles are 1+.  IMPRESSION AND PLAN:  Acute low back pain with radicular symptoms, duration less than 6 weeks Worsening severity of pain and paresthesias with conservative management, now with right leg proximal weakness. Suspect L1-L3 right spinal nerve root impingement.  Will obtain lumbar myelogram (pt has contraindication to MRI--he has a pacemaker) with post-myelogram CT. Continue oxycodone but I encouraged pt to take 2 tabs as often as every 4-6 hours instead of just 1 bid--new rx given today (see orders). I told him not to fill the oxycontin rx given by EDP at this time.  No further anti-inflammitory meds at this  time.     An After Visit Summary was printed and given to the patient.  FOLLOW UP: to be determined based on outcome of diagnostics

## 2013-05-12 NOTE — ED Provider Notes (Signed)
CSN: [id number]     Arrival date & time 05/12/13  0107 History   First MD Initiated Contact with Patient 05/12/13 [phone]     Chief Complaint  Patient presents with  . Back Pain   (Consider location/radiation/quality/duration/timing/severity/associated sxs/prior Treatment) HPI This is a 72 year old male with a two-week history of low back pain. The pain is in the right lumbar region radiating to the right buttock around to the right medial thigh. It is worse with ambulation or movement. He states the pain is severe and has not been relieved by oxycodone he was prescribed. He states he's only been taking the oxycodone twice daily. He also finished a five-day course of prednisone without relief. 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 He has no new numbness or weakness. He has no saddle anesthesia. He has no acute bowel or bladder changes. He denies back injury.  Past Medical History  Diagnosis Date  . Chronic low back pain   . Hypertension   . Diabetes mellitus   . Depression   . Dysrhythmia     bradycardia  . Shortness of breath   . 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 GERD (gastroesophageal reflux disease)   . Arthritis   . Permanent atrial fibrillation     DCCV 07/09/13-converted, lasted two days, then back into afib  . Echocardiogram abnormal 05/31/12    EF 40-45%, LA mod-severe dilated, AFIB  . History of cardiovascular stress test 05/28/12    no ischemia, EF 37%, imaging results are unchanged and within normal variance  . H/O tilt table evaluation 11/02/05    negative  . BPH (benign prostatic hypertrophy) 06/2011    Irritative sx's; pt declined trial of anticholinergic per Urology records  . History of vertigo     + Hx of posterior HA's.  Neuro (Dr. Sandria Manly) eval 2011.  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  [name]  History of retinal detachment     OS  . History of chronic prostatitis   . Lumbar spondylosis     with DDD  . Nephrolithiasis 07/2012    Left UVJ 2 mm stone with dilation of renal collecting system and slight hydroureter on right   Past Surgical History  Procedure Laterality Date  . Eye surgery      retinal detachment OS+ cataract surgery  . Back surgery  1976  . Insert / replace / remove pacemaker  02/05/2012    dual chamber, sinus node dysfunction, sinus arrest, PAF, Medtronic Revo serial#-[id number]  . Cardioversion  07/09/2012    Procedure: CARDIOVERSION;  Surgeon: Thurmon Fair, MD;  Location: [facility] ENDOSCOPY;  Service: Cardiovascular;  Laterality: N/A;  . Esophagogastroduodenoscopy  10/18/06    Done due to chronic GERD: Normal, bx showed no barrett's esophagus (Dr. Elnoria Howard)  . Colonoscopy w/ polypectomy  approx 2006; repeat 09/2011    Polyps on 2013 EGD as well.  . Transthoracic echocardiogram  08/25/10    mild asymmetric LVH, normal systolic function, normal diastolic fxn, mild-to-mod mitral regurg, mild aortic valve sclerosis and trace AI, mild aortic root dilatation.  . Cardiovascular stress test  2012    nuclear perfusion study: low risk scan   Family History  Problem Relation Age of Onset  . Heart failure Mother   . Stroke Mother   . Stroke Father    History  Substance Use Topics  .  Smoking status: Never Smoker   . Smokeless tobacco: Never Used  . Alcohol Use: 7.2 oz/week    12 Cans of beer per week     Comment: 12 beers per week    Review of Systems  All other systems reviewed and are negative.    Allergies  Review of patient's allergies indicates no known allergies.  Home Medications   Current Outpatient Rx  Name  Route  Sig  Dispense  Refill  . Cholecalciferol (VITAMIN D3) 2000 UNITS TABS   Oral   Take 2,000 Units by mouth daily.         [name] CINNAMON PO   Oral   Take 1,000 mg by mouth daily.          [name] esomeprazole (NEXIUM) 40 MG capsule   Oral    Take 1 capsule (40 mg total) by mouth 2 (two) times daily.   180 capsule   1   . losartan (COZAAR) 100 MG tablet   Oral   Take 1 tablet (100 mg total) by mouth daily.   90 tablet   1   . meclizine (ANTIVERT) 25 MG tablet   Oral   Take 1 tablet (25 mg total) by mouth 3 (three) times daily as needed. For vertigo.   90 tablet   1   . metFORMIN (GLUCOPHAGE) 1000 MG tablet   Oral   Take 1 tablet (1,000 mg total) by mouth 2 (two) times daily.   180 tablet   1   . rOPINIRole (REQUIP) 1 MG tablet      TAKE 1 TABLET DAILY   90 tablet   0   . sertraline (ZOLOFT) 50 MG tablet   Oral   Take 1 tablet (50 mg total) by mouth daily.   90 tablet   1   . simvastatin (ZOCOR) 20 MG tablet   Oral   Take 1 tablet (20 mg total) by mouth every evening.   90 tablet   1   . sitaGLIPtin (JANUVIA) 100 MG tablet   Oral   Take 1 tablet (100 mg total) by mouth daily.   90 tablet   1   . XARELTO 20 MG TABS tablet      TAKE 1 TABLET DAILY   90 tablet   1   . oxyCODONE-acetaminophen (PERCOCET/ROXICET) 5-325 MG per tablet      1-2 tabs po q6h prn severe pain   30 tablet   0   . predniSONE (DELTASONE) 20 MG tablet      2 tabs po qd x 5d   10 tablet   0    BP 151/95  Pulse 73  Temp(Src) 98.2 F (36.8 C) (Oral)  Resp 20  Ht 5\' 11"  (1.803 m)  Wt 225 lb (102.059 kg)  BMI 31.39 kg/m2  SpO2 100%  Physical Exam General: Well-developed, well-nourished male in no acute distress; appearance consistent with age of record HENT: normocephalic; atraumatic Eyes: pupils equal, round and reactive to light; extraocular muscles intact Neck: supple Heart: regular rate and rhythm; no murmurs, rubs or gallops Lungs: clear to auscultation bilaterally Abdomen: soft; nondistended; nontender; no masses or hepatosplenomegaly; bowel sounds present Back: Right paraspinal tenderness; pain on straight leg raise on the right at 45 Extremities: No deformity; full range of motion; pulses normal; no  edema Neurologic: Awake, alert and oriented; motor function intact in all extremities and symmetric; sensation intact and equal; no facial droop; normal coordination speech Skin: Warm and dry Psychiatric: Normal mood  and affect    ED Course  Procedures (including critical care time)  MDM   Nursing notes and vitals signs, including pulse oximetry, reviewed.  Summary of this visit's results, reviewed by myself:  Labs:  Results for orders placed during the hospital encounter of 05/12/13 (from the past 24 hour(s))  URINALYSIS, ROUTINE W REFLEX MICROSCOPIC     Status: None   Collection Time    05/12/13  1:30 AM      Result Value Range   Color, Urine YELLOW  YELLOW   APPearance CLEAR  CLEAR   Specific Gravity, Urine 1.028  1.005 - 1.030   pH 5.0  5.0 - 8.0   Glucose, UA NEGATIVE  NEGATIVE mg/dL   Hgb urine dipstick NEGATIVE  NEGATIVE   Bilirubin Urine NEGATIVE  NEGATIVE   Ketones, ur NEGATIVE  NEGATIVE mg/dL   Protein, ur NEGATIVE  NEGATIVE mg/dL   Urobilinogen, UA 0.2  0.0 - 1.0 mg/dL   Nitrite NEGATIVE  NEGATIVE   Leukocytes, UA NEGATIVE  NEGATIVE    Imaging Studies: Dg Lumbar Spine Complete  05/12/2013   CLINICAL DATA:  Right-sided low back pain radiating to the right hip and right foot. Numbness in the feet with tingling in the knees and feet.  EXAM: LUMBAR SPINE - COMPLETE 4+ VIEW  COMPARISON:  11/06/2011  FINDINGS: Five lumbar type vertebrae. Diffuse degenerative change throughout the lumbar spine with narrowed lumbar interspaces and associated endplate hypertrophic change. No vertebral compression deformity. Normal alignment. No significant changes since the previous study.  IMPRESSION: Diffuse degenerative changes.  No displaced fractures.   Electronically Signed   By: Burman Nieves M.D.   On: 05/12/2013 03:15   3:27 AM Feels better after IM Dilaudid. We will start him on OxyContin and have him follow up with his primary care physician for further evaluation. He will  probably require an MRI for definitive diagnosis.    Hanley Seamen, MD 05/12/13 [phone]

## 2013-05-12 NOTE — ED Notes (Signed)
Pt c/o lower back pain (right-sided) that radiates down through right hip to right foot. States tonight the right hip/leg/foot pain is the worst 10/10, also reports intermittent numbness in feet, tingling from bilateral knees to bilateral feet.

## 2013-05-12 NOTE — Assessment & Plan Note (Addendum)
Worsening severity of pain and paresthesias with conservative management, now with right leg proximal weakness. Suspect L1-L3 right spinal nerve root impingement.  Will obtain lumbar myelogram (pt has contraindication to MRI--he has a pacemaker) with post-myelogram CT. Continue percocet but I encouraged pt to take 2 tabs as often as every 4-6 hours instead of just 1 bid--new rx given today (see orders). I told him not to fill the oxycontin rx given by EDP at this time.  No further anti-inflammitory meds at this time.

## 2013-05-12 NOTE — Telephone Encounter (Signed)
Discussed at today's OV

## 2013-05-13 ENCOUNTER — Telehealth: Payer: Self-pay | Admitting: Cardiovascular Disease

## 2013-05-13 NOTE — Telephone Encounter (Signed)
Medical Records - Please look for fax and update.  Thanks.  ~ [employer]. Lorin Picket, BSN, RN

## 2013-05-13 NOTE — Telephone Encounter (Signed)
Per Larita Fife, Medical Records, fax received twice and on Dr. Landry Dyke cart for review.

## 2013-05-13 NOTE — Telephone Encounter (Signed)
Pt called again and says he is in a lot of pain.Would Dr Tresa Endo please do what he needs to do.Please call him today.

## 2013-05-13 NOTE — Telephone Encounter (Signed)
done

## 2013-05-13 NOTE — Telephone Encounter (Signed)
Having to have a CT Scan  A fax was supposed to be sent to [place] yesterday for Dr approval to have CT scan  Please call

## 2013-05-13 NOTE — Telephone Encounter (Signed)
Returned call and pt verified x 2.  Pt informed messages received and have been forwarded to Dr. Pierre Bali, CMA for review, signature and fax back.  Pt also informed Dr. Tresa Endo is in the middle of clinic and usually reviews paperwork at the end of the day.  Pt stated he understands Dr. Tresa Endo is seeing patients, but doesn't understand why it takes so long to complete paperwork.  Stated he is in pain and wants it relieved.  Pt informed they will be notified again and if a response is given before 5pm, RN will call him back.  Pt verbalized understanding and agreed w/ plan.

## 2013-05-13 NOTE — Telephone Encounter (Signed)
Want to know if you received fax-stating that he can stop his Xarelto.

## 2013-05-13 NOTE — Telephone Encounter (Signed)
Message forwarded to Dr. Kelly/Wanda, CMA.  This note printed and placed on Dr. Kelly's cart. 

## 2013-05-14 NOTE — Telephone Encounter (Signed)
Call to pt and informed form completed and faxed back.  Pt verbalized understanding and agreed w/ plan.

## 2013-05-16 ENCOUNTER — Encounter: Payer: Self-pay | Admitting: Family Medicine

## 2013-05-16 ENCOUNTER — Other Ambulatory Visit: Payer: Self-pay | Admitting: Family Medicine

## 2013-05-16 ENCOUNTER — Ambulatory Visit
Admission: RE | Admit: 2013-05-16 | Discharge: 2013-05-16 | Disposition: A | Discharge status: Home / Self Care | Payer: Medicare Other | Source: Ambulatory Visit | Attending: Family Medicine | Admitting: Family Medicine

## 2013-05-16 VITALS — BP 123/75 | HR 80

## 2013-05-16 DIAGNOSIS — M48061 Spinal stenosis, lumbar region without neurogenic claudication: Secondary | ICD-10-CM

## 2013-05-16 DIAGNOSIS — M5410 Radiculopathy, site unspecified: Secondary | ICD-10-CM

## 2013-05-16 DIAGNOSIS — M4310 Spondylolisthesis, site unspecified: Secondary | ICD-10-CM

## 2013-05-16 DIAGNOSIS — M545 Low back pain, unspecified: Secondary | ICD-10-CM | POA: Diagnosis not present

## 2013-05-16 DIAGNOSIS — M5126 Other intervertebral disc displacement, lumbar region: Secondary | ICD-10-CM | POA: Diagnosis not present

## 2013-05-16 DIAGNOSIS — M5136 Other intervertebral disc degeneration, lumbar region: Secondary | ICD-10-CM

## 2013-05-16 DIAGNOSIS — R29898 Other symptoms and signs involving the musculoskeletal system: Secondary | ICD-10-CM

## 2013-05-16 DIAGNOSIS — M47816 Spondylosis without myelopathy or radiculopathy, lumbar region: Secondary | ICD-10-CM

## 2013-05-16 DIAGNOSIS — M51369 Other intervertebral disc degeneration, lumbar region without mention of lumbar back pain or lower extremity pain: Secondary | ICD-10-CM

## 2013-05-16 DIAGNOSIS — M5416 Radiculopathy, lumbar region: Secondary | ICD-10-CM

## 2013-05-16 MED ORDER — IOHEXOL 180 MG/ML  SOLN: 17.0000 mL | Freq: Once | INTRAMUSCULAR | Status: DC | PRN

## 2013-05-16 MED ORDER — MEPERIDINE HCL 100 MG/ML IJ SOLN
100.0000 mg | Freq: Once | INTRAMUSCULAR | Status: AC
  Administered 2013-05-16: 100 mg via INTRAMUSCULAR

## 2013-05-16 MED ORDER — ONDANSETRON HCL 4 MG/2ML IJ SOLN
4.0000 mg | Freq: Once | INTRAMUSCULAR | Status: AC
  Administered 2013-05-16: 4 mg via INTRAMUSCULAR

## 2013-05-16 NOTE — Progress Notes (Signed)
Pt has been off xarelto and zoloft for the past 3 days. Discharge instructions explained to pt.

## 2013-05-19 ENCOUNTER — Ambulatory Visit (INDEPENDENT_AMBULATORY_CARE_PROVIDER_SITE_OTHER): Payer: Medicare Other

## 2013-05-19 ENCOUNTER — Telehealth: Payer: Self-pay | Admitting: Family Medicine

## 2013-05-19 DIAGNOSIS — I4891 Unspecified atrial fibrillation: Secondary | ICD-10-CM | POA: Diagnosis not present

## 2013-05-19 DIAGNOSIS — I4430 Unspecified atrioventricular block: Secondary | ICD-10-CM

## 2013-05-19 LAB — PACEMAKER DEVICE OBSERVATION

## 2013-05-19 NOTE — Telephone Encounter (Signed)
Patient wants to know if you have ordered PT yet.  Please advise.  He said he is really having a hard time with the pain.

## 2013-05-20 MED ORDER — GABAPENTIN 300 MG PO CAPS: 300.0000 mg | ORAL_CAPSULE | Freq: Three times a day (TID) | ORAL | Status: DC

## 2013-05-20 NOTE — Telephone Encounter (Signed)
Patient aware and would like to try neurontin 300mg  as recommended.  Medication sent to [facility].

## 2013-05-20 NOTE — Telephone Encounter (Signed)
Yes.  PT has been ordered so he should be getting contacted any time now to get appt. Also, offer him an ADDITIONAL med for nerve pain in his leg.  It is called neurontin. If he's interested then pls eRx neurontin 300mg , 1 tab po tid, #90, RF x 1.  Tell him to start with 1 pill at bedtime for 5d, then start taking 1 pill twice a day for 5d, then start taking 1 pill three times per day like the instructions say.--thx

## 2013-05-22 ENCOUNTER — Encounter: Payer: Self-pay | Admitting: *Deleted

## 2013-05-22 DIAGNOSIS — M5106 Intervertebral disc disorders with myelopathy, lumbar region: Secondary | ICD-10-CM | POA: Diagnosis not present

## 2013-05-22 DIAGNOSIS — M48062 Spinal stenosis, lumbar region with neurogenic claudication: Secondary | ICD-10-CM | POA: Diagnosis not present

## 2013-05-22 LAB — REMOTE PACEMAKER DEVICE
AL IMPEDENCE PM: 608 Ohm
BATTERY VOLTAGE: 2.99 V
BMOD-0001RV: LOW
BMOD-0002RV: 7
BMOD-0003RV: 30
BMOD-0004RV: 5
RV LEAD IMPEDENCE PM: 456 Ohm
VENTRICULAR PACING PM: 99.96

## 2013-05-26 ENCOUNTER — Encounter: Payer: Self-pay | Admitting: Family Medicine

## 2013-05-26 ENCOUNTER — Encounter: Payer: Self-pay | Admitting: Cardiovascular Disease

## 2013-05-26 ENCOUNTER — Ambulatory Visit (INDEPENDENT_AMBULATORY_CARE_PROVIDER_SITE_OTHER): Payer: Medicare Other | Admitting: Family Medicine

## 2013-05-26 VITALS — BP 157/106 | HR 86 | Temp 98.7°F | Resp 18 | Ht 70.0 in | Wt 225.0 lb

## 2013-05-26 DIAGNOSIS — E119 Type 2 diabetes mellitus without complications: Secondary | ICD-10-CM | POA: Diagnosis not present

## 2013-05-26 DIAGNOSIS — I10 Essential (primary) hypertension: Secondary | ICD-10-CM | POA: Insufficient documentation

## 2013-05-26 DIAGNOSIS — IMO0002 Reserved for concepts with insufficient information to code with codable children: Secondary | ICD-10-CM | POA: Diagnosis not present

## 2013-05-26 DIAGNOSIS — Z23 Encounter for immunization: Secondary | ICD-10-CM | POA: Diagnosis not present

## 2013-05-26 DIAGNOSIS — M48062 Spinal stenosis, lumbar region with neurogenic claudication: Secondary | ICD-10-CM | POA: Diagnosis not present

## 2013-05-26 DIAGNOSIS — M5410 Radiculopathy, site unspecified: Secondary | ICD-10-CM

## 2013-05-26 DIAGNOSIS — M5106 Intervertebral disc disorders with myelopathy, lumbar region: Secondary | ICD-10-CM | POA: Diagnosis not present

## 2013-05-26 NOTE — Progress Notes (Signed)
OFFICE NOTE  05/26/2013  CC:  Chief Complaint  Patient presents with  . Follow-up     HPI: Patient is a 72 y.o. Caucasian male who is here for routine f/u DM2 and HTN. Avg fasting gluc: 130-160.  No postprandial.   Since back probs lately he has not paid attention to his diet, has not been able to exercise. He has been compliant with his chronic meds.  He has not monitored his bp at home.  No dizziness, no HAs, no vision c/o, no chest pains.  He used to have some intermittent burning/tingling/numbness in feet: this has dissipated to a great degree on neurontin I recently rx'd.  Pertinent PMH:  Past Medical History  Diagnosis Date  . Chronic low back pain   . Hypertension   . Diabetes mellitus   . Depression   . Dysrhythmia     bradycardia  . Shortness of breath   . 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 GERD (gastroesophageal reflux disease)   . Arthritis   . Permanent atrial fibrillation     DCCV 07/09/13-converted, lasted two days, then back into afib  . Echocardiogram abnormal 05/31/12    EF 40-45%, LA mod-severe dilated, AFIB  . History of cardiovascular stress test 05/28/12    no ischemia, EF 37%, imaging results are unchanged and within normal variance  . H/O tilt table evaluation 11/02/05    negative  . BPH (benign prostatic hypertrophy) 06/2011    Irritative sx's; pt declined trial of anticholinergic per Urology records  . History of vertigo     + Hx of posterior HA's.  Neuro (Dr. Sandria Manly) eval 2011.  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  [name] History of retinal detachment     OS  . History of chronic prostatitis   . Lumbar spondylosis     with DDD  . Nephrolithiasis 07/2012    Left UVJ 2 mm stone with dilation of renal collecting system and slight hydroureter on right  . Episodic low back pain 01/22/2013   Past Surgical History  Procedure  Laterality Date  . Eye surgery      retinal detachment OS+ cataract surgery  . Lumbar laminectomy  1976    left L4-5  . Insert / replace / remove pacemaker  02/05/2012    dual chamber, sinus node dysfunction, sinus arrest, PAF, Medtronic Revo serial#-[id number]  . Cardioversion  07/09/2012    Procedure: CARDIOVERSION;  Surgeon: Thurmon Fair, MD;  Location: [facility] ENDOSCOPY;  Service: Cardiovascular;  Laterality: N/A;  . Esophagogastroduodenoscopy  10/18/06    Done due to chronic GERD: Normal, bx showed no barrett's esophagus (Dr. Elnoria Howard)  . Colonoscopy w/ polypectomy  approx 2006; repeat 09/2011    Polyps on 2013 EGD as well.  . Transthoracic echocardiogram  08/25/10    mild asymmetric LVH, normal systolic function, normal diastolic fxn, mild-to-mod mitral regurg, mild aortic valve sclerosis and trace AI, mild aortic root dilatation.  . Cardiovascular stress test  2012    nuclear perfusion study: low risk scan  ] MEDS:  Outpatient Prescriptions Prior to Visit  Medication Sig Dispense Refill  . Cholecalciferol (VITAMIN D3) 2000 UNITS TABS Take 2,000 Units by mouth daily.      [name] CINNAMON PO Take 1,000 mg by mouth daily.       [name] esomeprazole (NEXIUM) 40 MG capsule Take 1 capsule (40 mg total) by mouth 2 (two) times  daily.  180 capsule  1  . gabapentin (NEURONTIN) 300 MG capsule Take 1 capsule (300 mg total) by mouth 3 (three) times daily.  90 capsule  1  . losartan (COZAAR) 100 MG tablet Take 1 tablet (100 mg total) by mouth daily.  90 tablet  1  . meclizine (ANTIVERT) 25 MG tablet Take 1 tablet (25 mg total) by mouth 3 (three) times daily as needed. For vertigo.  90 tablet  1  . metFORMIN (GLUCOPHAGE) 1000 MG tablet Take 1 tablet (1,000 mg total) by mouth 2 (two) times daily.  180 tablet  1  . oxyCODONE-acetaminophen (PERCOCET/ROXICET) 5-325 MG per tablet 1-2 tabs po q6h prn severe pain  90 tablet  0  . rOPINIRole (REQUIP) 1 MG tablet TAKE 1 TABLET DAILY  90 tablet  0  . sertraline (ZOLOFT) 50 MG  tablet Take 1 tablet (50 mg total) by mouth daily.  90 tablet  1  . simvastatin (ZOCOR) 20 MG tablet Take 1 tablet (20 mg total) by mouth every evening.  90 tablet  1  . sitaGLIPtin (JANUVIA) 100 MG tablet Take 1 tablet (100 mg total) by mouth daily.  90 tablet  1  . XARELTO 20 MG TABS tablet TAKE 1 TABLET DAILY  90 tablet  1   No facility-administered medications prior to visit.    PE: Blood pressure 157/106, pulse 86, temperature 98.7 F (37.1 C), temperature source Temporal, resp. rate 18, height 5\' 10"  (1.778 m), weight 225 lb (102.059 kg), SpO2 98.00%.  Gen: Alert, well appearing.  Patient is oriented to person, place, time, and situation. Foot exam - bilateral normal; no swelling, tenderness or skin or vascular lesions. Color and temperature is normal. Sensation is intact. Peripheral pulses are palpable. Toenails are normal.  LAB: none today Lab Results  Component Value Date   HGBA1C 6.7* 01/03/2013     Chemistry      Component Value Date/Time   NA 141 01/03/2013 0944   K 4.8 01/03/2013 0944   CL 107 01/03/2013 0944   CO2 25 01/03/2013 0944   BUN 20 01/03/2013 0944   CREATININE 1.0 01/03/2013 0944      Component Value Date/Time   CALCIUM 9.8 01/03/2013 0944   ALKPHOS 58 01/03/2013 0944   AST 23 01/03/2013 0944   ALT 17 01/03/2013 0944   BILITOT 0.6 01/03/2013 0944     Lab Results  Component Value Date   WBC 4.7 01/03/2013   HGB 14.2 01/03/2013   HCT 43.2 01/03/2013   MCV 93.1 01/03/2013   PLT 178.0 01/03/2013   Lab Results  Component Value Date   CHOL 120 01/03/2013   HDL 43.70 01/03/2013   LDLCALC 56 01/03/2013   TRIG 104.0 01/03/2013   CHOLHDL 3 01/03/2013     IMPRESSION AND PLAN:  1)   DM 2, 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 Compliant with meds, control is decent by home monitoring and last HbA1c. Feet exam normal today.  Next eye exam coming up in a few months with Dr. Dione Booze.  2) HTN: bp initially up, likely from anxiety and his  back pain.  Recheck was well within normal limits today. Continue current med.  3) Hyperlipidemia: stable--lipids great 5 mo ago.  Continue zocor.  4) Low back pain with radiculopathy: pain control not ideal.  He reports taking 6-8 percocet 5/325 per day, says PT helps but really only short term.  He is going 3 x/week and will continue this and  he is currently being set up to see a neurosurgeon for further e/m. When he runs out of current pain meds, will increase to oxycodone 10mg  tabs for better control and to avoid acetaminophen overuse issues.  No lab worker today in the office so we deferred A1c testing and urine microalbumin testing today.  We'll do this at next DM 2 f/u.  High dose flu vaccine IM today.  FOLLOW UP: 26mo f/u pain

## 2013-05-27 DIAGNOSIS — M5106 Intervertebral disc disorders with myelopathy, lumbar region: Secondary | ICD-10-CM | POA: Diagnosis not present

## 2013-05-27 DIAGNOSIS — M48062 Spinal stenosis, lumbar region with neurogenic claudication: Secondary | ICD-10-CM | POA: Diagnosis not present

## 2013-05-30 DIAGNOSIS — M5106 Intervertebral disc disorders with myelopathy, lumbar region: Secondary | ICD-10-CM | POA: Diagnosis not present

## 2013-05-30 DIAGNOSIS — M48062 Spinal stenosis, lumbar region with neurogenic claudication: Secondary | ICD-10-CM | POA: Diagnosis not present

## 2013-06-02 DIAGNOSIS — M5106 Intervertebral disc disorders with myelopathy, lumbar region: Secondary | ICD-10-CM | POA: Diagnosis not present

## 2013-06-02 DIAGNOSIS — M48062 Spinal stenosis, lumbar region with neurogenic claudication: Secondary | ICD-10-CM | POA: Diagnosis not present

## 2013-06-05 ENCOUNTER — Telehealth: Payer: Self-pay | Admitting: Family Medicine

## 2013-06-05 DIAGNOSIS — M48062 Spinal stenosis, lumbar region with neurogenic claudication: Secondary | ICD-10-CM | POA: Diagnosis not present

## 2013-06-05 DIAGNOSIS — M5106 Intervertebral disc disorders with myelopathy, lumbar region: Secondary | ICD-10-CM | POA: Diagnosis not present

## 2013-06-05 NOTE — Telephone Encounter (Signed)
Patient requesting refill on oxycodone.  Patient last seen on 05/26/13.   Last rx was 05/12/13 #90.  Please advise refill.

## 2013-06-06 DIAGNOSIS — M48062 Spinal stenosis, lumbar region with neurogenic claudication: Secondary | ICD-10-CM | POA: Diagnosis not present

## 2013-06-06 DIAGNOSIS — M5106 Intervertebral disc disorders with myelopathy, lumbar region: Secondary | ICD-10-CM | POA: Diagnosis not present

## 2013-06-06 MED ORDER — OXYCODONE HCL 5 MG PO TABS: ORAL_TABLET | ORAL | Status: DC

## 2013-06-06 NOTE — Telephone Encounter (Signed)
Rx for oxycodone printed (#90, 1-2 tabs tid prn pain)---tell him this one is a pill without the tylenol component.   Since he is taking quite a bit of pain med I didn't want to have him taking so much tylenol so regularly.-thx

## 2013-06-06 NOTE — Telephone Encounter (Signed)
LMOM for patient to pick rx up at the front desk.

## 2013-06-09 DIAGNOSIS — M545 Low back pain, unspecified: Secondary | ICD-10-CM | POA: Diagnosis not present

## 2013-06-09 DIAGNOSIS — IMO0002 Reserved for concepts with insufficient information to code with codable children: Secondary | ICD-10-CM | POA: Diagnosis not present

## 2013-06-09 DIAGNOSIS — R209 Unspecified disturbances of skin sensation: Secondary | ICD-10-CM | POA: Diagnosis not present

## 2013-06-20 ENCOUNTER — Telehealth: Payer: Self-pay | Admitting: Family Medicine

## 2013-06-20 ENCOUNTER — Other Ambulatory Visit: Payer: Self-pay | Admitting: Family Medicine

## 2013-06-20 DIAGNOSIS — E1149 Type 2 diabetes mellitus with other diabetic neurological complication: Secondary | ICD-10-CM

## 2013-06-20 DIAGNOSIS — I10 Essential (primary) hypertension: Secondary | ICD-10-CM

## 2013-06-20 DIAGNOSIS — M48062 Spinal stenosis, lumbar region with neurogenic claudication: Secondary | ICD-10-CM | POA: Diagnosis not present

## 2013-06-20 DIAGNOSIS — M5106 Intervertebral disc disorders with myelopathy, lumbar region: Secondary | ICD-10-CM | POA: Diagnosis not present

## 2013-06-20 NOTE — Telephone Encounter (Signed)
May just get labs---ask where he wants to get them---he may cancel his appt 06/24/13. Ask him to make f/u appt for about 2-3 mo from now. Ask him if his neurologist has referred him to a neurosurgeon or not.-thx

## 2013-06-20 NOTE — Telephone Encounter (Signed)
Patient is inquiring as to whether he needs to keep his appointment next week or whether he just needs to have bloodwork done. Please advise.

## 2013-06-20 NOTE — Telephone Encounter (Signed)
Orders entered and released 

## 2013-06-20 NOTE — Telephone Encounter (Signed)
Patient notified. Patient will go to [facility] for labs. Patient stated that he was referred to neurosurgeon.

## 2013-06-23 DIAGNOSIS — M5106 Intervertebral disc disorders with myelopathy, lumbar region: Secondary | ICD-10-CM | POA: Diagnosis not present

## 2013-06-23 DIAGNOSIS — M48062 Spinal stenosis, lumbar region with neurogenic claudication: Secondary | ICD-10-CM | POA: Diagnosis not present

## 2013-06-24 ENCOUNTER — Ambulatory Visit: Payer: Medicare Other | Admitting: Family Medicine

## 2013-07-04 DIAGNOSIS — N401 Enlarged prostate with lower urinary tract symptoms: Secondary | ICD-10-CM | POA: Diagnosis not present

## 2013-07-04 DIAGNOSIS — N139 Obstructive and reflux uropathy, unspecified: Secondary | ICD-10-CM | POA: Diagnosis not present

## 2013-07-04 DIAGNOSIS — N318 Other neuromuscular dysfunction of bladder: Secondary | ICD-10-CM | POA: Diagnosis not present

## 2013-07-04 DIAGNOSIS — Z125 Encounter for screening for malignant neoplasm of prostate: Secondary | ICD-10-CM | POA: Diagnosis not present

## 2013-07-06 ENCOUNTER — Other Ambulatory Visit: Payer: Self-pay | Admitting: Family Medicine

## 2013-07-11 DIAGNOSIS — N401 Enlarged prostate with lower urinary tract symptoms: Secondary | ICD-10-CM | POA: Diagnosis not present

## 2013-07-11 DIAGNOSIS — N139 Obstructive and reflux uropathy, unspecified: Secondary | ICD-10-CM | POA: Diagnosis not present

## 2013-07-14 DIAGNOSIS — M5106 Intervertebral disc disorders with myelopathy, lumbar region: Secondary | ICD-10-CM | POA: Diagnosis not present

## 2013-07-14 DIAGNOSIS — M48062 Spinal stenosis, lumbar region with neurogenic claudication: Secondary | ICD-10-CM | POA: Diagnosis not present

## 2013-07-15 ENCOUNTER — Other Ambulatory Visit (INDEPENDENT_AMBULATORY_CARE_PROVIDER_SITE_OTHER): Payer: Medicare Other

## 2013-07-15 DIAGNOSIS — E119 Type 2 diabetes mellitus without complications: Secondary | ICD-10-CM

## 2013-07-16 ENCOUNTER — Ambulatory Visit (INDEPENDENT_AMBULATORY_CARE_PROVIDER_SITE_OTHER): Payer: Medicare Other | Admitting: Family Medicine

## 2013-07-16 ENCOUNTER — Encounter: Payer: Self-pay | Admitting: Family Medicine

## 2013-07-16 VITALS — BP 144/93 | HR 80 | Temp 98.1°F | Resp 18 | Ht 70.0 in | Wt 226.0 lb

## 2013-07-16 DIAGNOSIS — J069 Acute upper respiratory infection, unspecified: Secondary | ICD-10-CM

## 2013-07-16 DIAGNOSIS — I10 Essential (primary) hypertension: Secondary | ICD-10-CM

## 2013-07-16 NOTE — Progress Notes (Signed)
OFFICE NOTE  07/16/2013  CC:  Chief Complaint  Patient presents with  . Knee Pain    right knee      HPI: Patient is a 72 y.o. Caucasian male who is here for 7 wk f/u for low back pain, on oxycodone 10mg  tabs for pain control.   He has appt with neurosurgeon on 08/02/13 ([place]  He doesn't take any oxycodone now, not in the last week, b/c his radicular pain has resolved.  He is still hurting in low back and is going to PT for this.  This is improving some, too.  Has hx of intermittent right knee pain with getting in/out of care and walking up steps. Feels like it may buckle.  No locking up.  No signif swelling.  No past injury in the knee.  A soft knee brace from [facility] has helped.  Some URI/cough/ST for the last 1 wk, no fever or sob or wheezing.  No achiness or malaise. Taking nyquil--helps him sleep.  Feels like sx's improving slightly lately, main issue is night-time cough.  Compliant w/BP meds.  No numbers from home to report today.  No HAs, no dizziness, no chest pain or SOB.   Pertinent PMH:  Past Medical History  Diagnosis Date  . Chronic low back pain   . Hypertension   . Diabetes mellitus   . Depression   . Dysrhythmia     bradycardia  . Shortness of breath   . 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 GERD (gastroesophageal reflux disease)   . Arthritis   . Permanent atrial fibrillation     DCCV 07/09/13-converted, lasted two days, then back into afib  . Echocardiogram abnormal 05/31/12    EF 40-45%, LA mod-severe dilated, AFIB  . History of cardiovascular stress test 05/28/12    no ischemia, EF 37%, imaging results are unchanged and within normal variance  . H/O tilt table evaluation 11/02/05    negative  . BPH (benign prostatic hypertrophy) 06/2011    Irritative sx's; pt declined trial of anticholinergic per Urology records  . History of vertigo     + Hx of posterior HA's.  Neuro (Dr. Sandria Manly) eval 2011.  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  Congenitally small posterior circulation.  [name] History of retinal detachment     OS  . History of chronic prostatitis   . Lumbar spondylosis     with DDD  . Nephrolithiasis 07/2012    Left UVJ 2 mm stone with dilation of renal collecting system and slight hydroureter on right  . Episodic low back pain 01/22/2013   Past Surgical History  Procedure Laterality Date  . Eye surgery      retinal detachment OS+ cataract surgery  . Lumbar laminectomy  1976    left L4-5  . Insert / replace / remove pacemaker  02/05/2012    dual chamber, sinus node dysfunction, sinus arrest, PAF, Medtronic Revo serial#-[id number]  . Cardioversion  07/09/2012    Procedure: CARDIOVERSION;  Surgeon: Thurmon Fair, MD;  Location: [facility] ENDOSCOPY;  Service: Cardiovascular;  Laterality: N/A;  . Esophagogastroduodenoscopy  10/18/06    Done due to chronic GERD: Normal, bx showed no barrett's esophagus (Dr. Elnoria Howard)  . Colonoscopy w/ polypectomy  approx 2006; repeat 09/2011    Polyps on 2013 EGD as well.  . Transthoracic echocardiogram  08/25/10    mild asymmetric LVH, normal systolic function, normal diastolic fxn, mild-to-mod mitral  regurg, mild aortic valve sclerosis and trace AI, mild aortic root dilatation.  . Cardiovascular stress test  2012    nuclear perfusion study: low risk scan    MEDS:  Outpatient Prescriptions Prior to Visit  Medication Sig Dispense Refill  . Cholecalciferol (VITAMIN D3) 2000 UNITS TABS Take 2,000 Units by mouth daily.      [name] CINNAMON PO Take 1,000 mg by mouth daily.       [name] esomeprazole (NEXIUM) 40 MG capsule Take 1 capsule (40 mg total) by mouth 2 (two) times daily.  180 capsule  1  . gabapentin (NEURONTIN) 300 MG capsule Take 1 capsule (300 mg total) by mouth 3 (three) times daily.  90 capsule  1  . losartan (COZAAR) 100 MG tablet Take 1 tablet (100 mg total) by mouth daily.  90 tablet  1  . meclizine (ANTIVERT) 25 MG tablet Take 1  tablet (25 mg total) by mouth 3 (three) times daily as needed. For vertigo.  90 tablet  1  . metFORMIN (GLUCOPHAGE) 1000 MG tablet Take 1 tablet (1,000 mg total) by mouth 2 (two) times daily.  180 tablet  1  . rOPINIRole (REQUIP) 1 MG tablet TAKE 1 TABLET DAILY  90 tablet  1  . sertraline (ZOLOFT) 50 MG tablet Take 1 tablet (50 mg total) by mouth daily.  90 tablet  1  . simvastatin (ZOCOR) 20 MG tablet Take 1 tablet (20 mg total) by mouth every evening.  90 tablet  1  . sitaGLIPtin (JANUVIA) 100 MG tablet Take 1 tablet (100 mg total) by mouth daily.  90 tablet  1  . XARELTO 20 MG TABS tablet TAKE 1 TABLET DAILY  90 tablet  1  . oxyCODONE (OXY IR/ROXICODONE) 5 MG immediate release tablet 1-2 tabs po tid prn pain  90 tablet  0   No facility-administered medications prior to visit.    PE: Blood pressure 144/93, pulse 80, temperature 98.1 F (36.7 C), temperature source Temporal, resp. rate 18, height 5\' 10"  (1.778 m), weight 226 lb (102.513 kg), SpO2 97.00%. Gen: Alert, well appearing.  Patient is oriented to person, place, time, and situation. SLR neg bilat. Patellar DTR on right 1+, and 2+ on left. Achilles DTR 1+ bilat. Right knee: mild bony hypertrophy.  No effusion or erythema or warmth.  ROM fully intact.  Nontender to palpation.  Neg patellar grind.  +crepitus R>L.  IMPRESSION AND PLAN:  1) HTN: stable.  The current medical regimen is effective;  continue present plan and medications.  2) Acute LBP w/right leg radiculopathy--MUCH improved.  Continue PT. Keep neurosurgeon appt next month.  No new pain med rx today-he is using this med very infrequently lately and has some left for future use if needed for acute flare.  3) Right knee osteoarthritis: continue w/knee sleave prn.  No imaging at this time. This bothers him infrequently enough that pain meds not needed.  4) Viral URI; symptomatic care discussed.  Robitussin DM suggested.  An After Visit Summary was printed and given to  the patient.  FOLLOW UP: 24mo--recheck DM/HTN/back and knee pain---we'll do routine HbA1c and BMET at that visit.

## 2013-07-16 NOTE — Progress Notes (Signed)
Pre visit review using our clinic review tool, if applicable. No additional management support is needed unless otherwise documented below in the visit note. 

## 2013-07-16 NOTE — Patient Instructions (Signed)
Buy OTC generic robitussin DM and use as directed on packaging. 

## 2013-07-18 LAB — HEMOGLOBIN A1C: Hgb A1c MFr Bld: 7.5 % — ABNORMAL HIGH (ref 4.6–6.5)

## 2013-07-22 ENCOUNTER — Telehealth: Payer: Self-pay | Admitting: Family Medicine

## 2013-07-22 MED ORDER — GABAPENTIN 300 MG PO CAPS: 300.0000 mg | ORAL_CAPSULE | Freq: Three times a day (TID) | ORAL | Status: DC

## 2013-07-22 NOTE — Telephone Encounter (Signed)
Patient requesting refill of gabapentin.  Patient last seen 07/16/13.  Last rx was given 05/20/13 x 1 refill.  Please advise refill.

## 2013-07-22 NOTE — Telephone Encounter (Signed)
Gabapentin eRx'd

## 2013-07-23 DIAGNOSIS — M5106 Intervertebral disc disorders with myelopathy, lumbar region: Secondary | ICD-10-CM | POA: Diagnosis not present

## 2013-07-23 DIAGNOSIS — M48062 Spinal stenosis, lumbar region with neurogenic claudication: Secondary | ICD-10-CM | POA: Diagnosis not present

## 2013-07-24 DIAGNOSIS — N183 Chronic kidney disease, stage 3 unspecified: Secondary | ICD-10-CM

## 2013-07-24 HISTORY — DX: Chronic kidney disease, stage 3 unspecified: N18.30

## 2013-07-25 DIAGNOSIS — M5106 Intervertebral disc disorders with myelopathy, lumbar region: Secondary | ICD-10-CM | POA: Diagnosis not present

## 2013-07-25 DIAGNOSIS — M48062 Spinal stenosis, lumbar region with neurogenic claudication: Secondary | ICD-10-CM | POA: Diagnosis not present

## 2013-07-30 DIAGNOSIS — IMO0002 Reserved for concepts with insufficient information to code with codable children: Secondary | ICD-10-CM | POA: Diagnosis not present

## 2013-08-06 DIAGNOSIS — M545 Low back pain, unspecified: Secondary | ICD-10-CM | POA: Diagnosis not present

## 2013-08-06 DIAGNOSIS — IMO0002 Reserved for concepts with insufficient information to code with codable children: Secondary | ICD-10-CM | POA: Diagnosis not present

## 2013-08-06 DIAGNOSIS — M216X9 Other acquired deformities of unspecified foot: Secondary | ICD-10-CM | POA: Diagnosis not present

## 2013-08-08 ENCOUNTER — Encounter: Payer: Self-pay | Admitting: Family Medicine

## 2013-08-20 ENCOUNTER — Ambulatory Visit (INDEPENDENT_AMBULATORY_CARE_PROVIDER_SITE_OTHER): Payer: Medicare Other | Admitting: *Deleted

## 2013-08-20 DIAGNOSIS — I4430 Unspecified atrioventricular block: Secondary | ICD-10-CM

## 2013-08-20 DIAGNOSIS — I4891 Unspecified atrial fibrillation: Secondary | ICD-10-CM

## 2013-08-20 LAB — PACEMAKER DEVICE OBSERVATION

## 2013-08-20 LAB — MDC_IDC_ENUM_SESS_TYPE_REMOTE
Battery Voltage: 2.97 V
Brady Statistic RV Percent Paced: 98.3 %
Date Time Interrogation Session: 20150128160248
Lead Channel Impedance Value: 448 Ohm
Lead Channel Impedance Value: 584 Ohm
Lead Channel Setting Pacing Amplitude: 2.5 V
Lead Channel Setting Pacing Pulse Width: 0.4 ms
Lead Channel Setting Sensing Sensitivity: 0.9 mV
Zone Setting Detection Interval: 400 ms
Zone Setting Detection Interval: 400 ms

## 2013-08-26 DIAGNOSIS — H02839 Dermatochalasis of unspecified eye, unspecified eyelid: Secondary | ICD-10-CM | POA: Diagnosis not present

## 2013-08-26 DIAGNOSIS — H4011X0 Primary open-angle glaucoma, stage unspecified: Secondary | ICD-10-CM | POA: Diagnosis not present

## 2013-08-26 DIAGNOSIS — Z961 Presence of intraocular lens: Secondary | ICD-10-CM | POA: Diagnosis not present

## 2013-08-26 DIAGNOSIS — E119 Type 2 diabetes mellitus without complications: Secondary | ICD-10-CM | POA: Diagnosis not present

## 2013-08-27 ENCOUNTER — Encounter: Payer: Self-pay | Admitting: *Deleted

## 2013-09-04 DIAGNOSIS — M5137 Other intervertebral disc degeneration, lumbosacral region: Secondary | ICD-10-CM | POA: Diagnosis not present

## 2013-09-04 DIAGNOSIS — M545 Low back pain, unspecified: Secondary | ICD-10-CM | POA: Diagnosis not present

## 2013-09-04 DIAGNOSIS — M48061 Spinal stenosis, lumbar region without neurogenic claudication: Secondary | ICD-10-CM | POA: Diagnosis not present

## 2013-09-09 DIAGNOSIS — M5106 Intervertebral disc disorders with myelopathy, lumbar region: Secondary | ICD-10-CM | POA: Diagnosis not present

## 2013-09-09 DIAGNOSIS — M48062 Spinal stenosis, lumbar region with neurogenic claudication: Secondary | ICD-10-CM | POA: Diagnosis not present

## 2013-09-16 ENCOUNTER — Other Ambulatory Visit: Payer: Self-pay | Admitting: Cardiovascular Disease

## 2013-09-25 DIAGNOSIS — M5126 Other intervertebral disc displacement, lumbar region: Secondary | ICD-10-CM | POA: Diagnosis not present

## 2013-09-25 DIAGNOSIS — IMO0002 Reserved for concepts with insufficient information to code with codable children: Secondary | ICD-10-CM | POA: Diagnosis not present

## 2013-09-25 DIAGNOSIS — M5137 Other intervertebral disc degeneration, lumbosacral region: Secondary | ICD-10-CM | POA: Diagnosis not present

## 2013-09-30 ENCOUNTER — Other Ambulatory Visit: Payer: Self-pay | Admitting: Family Medicine

## 2013-09-30 MED ORDER — SITAGLIPTIN PHOSPHATE 100 MG PO TABS: 100.0000 mg | ORAL_TABLET | Freq: Every day | ORAL | Status: DC

## 2013-10-08 ENCOUNTER — Telehealth: Payer: Self-pay

## 2013-10-08 ENCOUNTER — Other Ambulatory Visit: Payer: Self-pay | Admitting: Family Medicine

## 2013-10-08 MED ORDER — LOSARTAN POTASSIUM 100 MG PO TABS: 100.0000 mg | ORAL_TABLET | Freq: Every day | ORAL | Status: DC

## 2013-10-08 MED ORDER — METFORMIN HCL 1000 MG PO TABS: 1000.0000 mg | ORAL_TABLET | Freq: Two times a day (BID) | ORAL | Status: DC

## 2013-10-08 MED ORDER — SIMVASTATIN 20 MG PO TABS: 20.0000 mg | ORAL_TABLET | Freq: Every evening | ORAL | Status: DC

## 2013-10-08 MED ORDER — SERTRALINE HCL 50 MG PO TABS: 50.0000 mg | ORAL_TABLET | Freq: Every day | ORAL | Status: DC

## 2013-10-08 NOTE — Telephone Encounter (Signed)
Pt called, he needs refills on his medications. Zocor 20 mg, Cozaar 100 mg, Metformin 1000 mg bid, Zoloft 50 mg, Antivert 25 mg. Pt is about to run out of these medications so I offered to send in a 30 day supply to a [facility]. He will call me back to let me know which meds he needs. He has an appt on march 24th.

## 2013-10-14 ENCOUNTER — Encounter: Payer: Self-pay | Admitting: Family Medicine

## 2013-10-14 ENCOUNTER — Ambulatory Visit: Payer: Medicare Other | Admitting: Family Medicine

## 2013-10-14 ENCOUNTER — Ambulatory Visit (INDEPENDENT_AMBULATORY_CARE_PROVIDER_SITE_OTHER): Payer: Medicare Other | Admitting: Family Medicine

## 2013-10-14 VITALS — BP 141/93 | HR 75 | Temp 99.0°F | Resp 18 | Ht 70.0 in | Wt 232.0 lb

## 2013-10-14 DIAGNOSIS — M1710 Unilateral primary osteoarthritis, unspecified knee: Secondary | ICD-10-CM

## 2013-10-14 DIAGNOSIS — IMO0002 Reserved for concepts with insufficient information to code with codable children: Secondary | ICD-10-CM

## 2013-10-14 DIAGNOSIS — E1165 Type 2 diabetes mellitus with hyperglycemia: Principal | ICD-10-CM

## 2013-10-14 DIAGNOSIS — E119 Type 2 diabetes mellitus without complications: Secondary | ICD-10-CM

## 2013-10-14 DIAGNOSIS — M1711 Unilateral primary osteoarthritis, right knee: Secondary | ICD-10-CM | POA: Insufficient documentation

## 2013-10-14 DIAGNOSIS — I4891 Unspecified atrial fibrillation: Secondary | ICD-10-CM | POA: Diagnosis not present

## 2013-10-14 DIAGNOSIS — IMO0001 Reserved for inherently not codable concepts without codable children: Secondary | ICD-10-CM

## 2013-10-14 DIAGNOSIS — I10 Essential (primary) hypertension: Secondary | ICD-10-CM

## 2013-10-14 LAB — MICROALBUMIN / CREATININE URINE RATIO
Creatinine,U: 204.4 mg/dL
Microalb Creat Ratio: 0.1 mg/g (ref 0.0–30.0)
Microalb, Ur: 0.3 mg/dL (ref 0.0–1.9)

## 2013-10-14 LAB — HEMOGLOBIN A1C: Hgb A1c MFr Bld: 8.6 % — ABNORMAL HIGH (ref 4.6–6.5)

## 2013-10-14 NOTE — Progress Notes (Signed)
OFFICE NOTE  10/14/2013  CC:  Chief Complaint  Patient presents with  . Follow-up     HPI: Patient is a 73 y.o. Caucasian male who is here for 4 mo f/u DM 2 and HTN. "I feel fine". LBP update: neurosurg doing epidural steroid injections, due for another soon.  If the next one doesn't alleviate the pain then the plan is for surgery in early April.  Takes pain meds.  Pain is low back w/out radicular sx's.  Still with chronic right knee stiffness and intermittent periods of pain.  Still feels like right knee gives way a bit easy. No knee swelling or redness.  He has not done any TLC since last HbA1c check when A1c was up some.  Pertinent PMH:  Past medical, surgical, social, and family history reviewed and no changes are noted since last office visit.  MEDS:  Outpatient Prescriptions Prior to Visit  Medication Sig Dispense Refill  . Cholecalciferol (VITAMIN D3) 2000 UNITS TABS Take 2,000 Units by mouth daily.      [name] CINNAMON PO Take 1,000 mg by mouth daily.       [name] esomeprazole (NEXIUM) 40 MG capsule Take 1 capsule (40 mg total) by mouth 2 (two) times daily.  180 capsule  1  . gabapentin (NEURONTIN) 300 MG capsule Take 1 capsule (300 mg total) by mouth 3 (three) times daily.  90 capsule  6  . losartan (COZAAR) 100 MG tablet Take 1 tablet (100 mg total) by mouth daily.  90 tablet  1  . meclizine (ANTIVERT) 25 MG tablet Take 1 tablet (25 mg total) by mouth 3 (three) times daily as needed. For vertigo.  90 tablet  1  . metFORMIN (GLUCOPHAGE) 1000 MG tablet Take 1 tablet (1,000 mg total) by mouth 2 (two) times daily.  180 tablet  1  . oxyCODONE (OXY IR/ROXICODONE) 5 MG immediate release tablet 1-2 tabs po tid prn pain  90 tablet  0  . Rivaroxaban (XARELTO) 20 MG TABS tablet Take 1 tablet (20 mg total) by mouth daily with supper.  90 tablet  1  . rOPINIRole (REQUIP) 1 MG tablet TAKE 1 TABLET DAILY  90 tablet  1  . sertraline (ZOLOFT) 50 MG tablet Take 1 tablet (50 mg total) by mouth  daily.  90 tablet  1  . simvastatin (ZOCOR) 20 MG tablet Take 1 tablet (20 mg total) by mouth every evening.  90 tablet  1  . sitaGLIPtin (JANUVIA) 100 MG tablet Take 1 tablet (100 mg total) by mouth daily.  90 tablet  1   No facility-administered medications prior to visit.    PE: Blood pressure 141/93, pulse 75, temperature 99 F (37.2 C), temperature source Temporal, resp. rate 18, height 5\' 10"  (1.778 m), weight 232 lb (105.235 kg), SpO2 97.00%. Gen: Alert, well appearing.  Patient is oriented to person, place, time, and situation. CV: RRR, no m/r/g.   LUNGS: CTA bilat, nonlabored resps, good aeration in all lung fields. Knees: mild hypertrophic bony changes visible/palbable.  No erythema, warmth, or effusion. Mild patellar crepitus.  No instability.  Mild TTP in right peripatellar region--not particularly the joint line.  IMPRESSION AND PLAN:  1) DM 2, recent hx of poor control.  Pt failed to make dietary or exercise changes in response to this. Will recheck HbA1c today.  Urine microalb/cr today.  2) HTN; The current medical regimen is effective;  continue present plan and medications.  3) A-fib: continue anticoag with xarelto.  Sounds like  he's in sinus rhythm today.  4) Right knee osteoarthritis: not bothering him all that much at this point. After his back pain issues get resolved this spring/summer, we'll like check a plain film of right knee.  An After Visit Summary was printed and given to the patient.  FOLLOW UP: 4 mo

## 2013-10-14 NOTE — Progress Notes (Signed)
Pre visit review using our clinic review tool, if applicable. No additional management support is needed unless otherwise documented below in the visit note. 

## 2013-10-16 MED ORDER — GLIPIZIDE 5 MG PO TABS: 5.0000 mg | ORAL_TABLET | Freq: Every day | ORAL | Status: DC

## 2013-10-16 NOTE — Addendum Note (Signed)
Addended by: Jannette Spanner on: 10/16/2013 01:42 PM   Modules accepted: Orders

## 2013-10-17 DIAGNOSIS — M5137 Other intervertebral disc degeneration, lumbosacral region: Secondary | ICD-10-CM | POA: Diagnosis not present

## 2013-10-17 DIAGNOSIS — M5126 Other intervertebral disc displacement, lumbar region: Secondary | ICD-10-CM | POA: Diagnosis not present

## 2013-10-17 DIAGNOSIS — IMO0002 Reserved for concepts with insufficient information to code with codable children: Secondary | ICD-10-CM | POA: Diagnosis not present

## 2013-11-12 DIAGNOSIS — M5137 Other intervertebral disc degeneration, lumbosacral region: Secondary | ICD-10-CM | POA: Diagnosis not present

## 2013-11-12 DIAGNOSIS — IMO0002 Reserved for concepts with insufficient information to code with codable children: Secondary | ICD-10-CM | POA: Diagnosis not present

## 2013-11-12 DIAGNOSIS — M25519 Pain in unspecified shoulder: Secondary | ICD-10-CM | POA: Diagnosis not present

## 2013-11-12 DIAGNOSIS — M19019 Primary osteoarthritis, unspecified shoulder: Secondary | ICD-10-CM | POA: Diagnosis not present

## 2013-11-12 DIAGNOSIS — M5126 Other intervertebral disc displacement, lumbar region: Secondary | ICD-10-CM | POA: Diagnosis not present

## 2013-11-24 ENCOUNTER — Ambulatory Visit (INDEPENDENT_AMBULATORY_CARE_PROVIDER_SITE_OTHER): Payer: Medicare Other | Admitting: *Deleted

## 2013-11-24 ENCOUNTER — Encounter: Payer: Self-pay | Admitting: Cardiovascular Disease

## 2013-11-24 DIAGNOSIS — I4891 Unspecified atrial fibrillation: Secondary | ICD-10-CM

## 2013-11-24 DIAGNOSIS — I495 Sick sinus syndrome: Secondary | ICD-10-CM | POA: Diagnosis not present

## 2013-11-25 LAB — MDC_IDC_ENUM_SESS_TYPE_REMOTE
Battery Voltage: 2.97 V
Brady Statistic RV Percent Paced: 99.94 %
Date Time Interrogation Session: 20150504201734
Lead Channel Impedance Value: 416 Ohm
Lead Channel Impedance Value: 616 Ohm
Lead Channel Setting Pacing Amplitude: 2.5 V
Lead Channel Setting Pacing Pulse Width: 0.4 ms
Lead Channel Setting Sensing Sensitivity: 0.9 mV
Zone Setting Detection Interval: 400 ms
Zone Setting Detection Interval: 400 ms

## 2013-11-27 ENCOUNTER — Ambulatory Visit (INDEPENDENT_AMBULATORY_CARE_PROVIDER_SITE_OTHER): Payer: Medicare Other | Admitting: Family Medicine

## 2013-11-27 ENCOUNTER — Encounter: Payer: Self-pay | Admitting: Family Medicine

## 2013-11-27 VITALS — BP 131/89 | HR 89 | Temp 98.4°F | Resp 18 | Ht 70.0 in | Wt 228.0 lb

## 2013-11-27 DIAGNOSIS — S139XXA Sprain of joints and ligaments of unspecified parts of neck, initial encounter: Secondary | ICD-10-CM | POA: Diagnosis not present

## 2013-11-27 DIAGNOSIS — S43499A Other sprain of unspecified shoulder joint, initial encounter: Secondary | ICD-10-CM | POA: Diagnosis not present

## 2013-11-27 DIAGNOSIS — S46819A Strain of other muscles, fascia and tendons at shoulder and upper arm level, unspecified arm, initial encounter: Secondary | ICD-10-CM

## 2013-11-27 DIAGNOSIS — S161XXA Strain of muscle, fascia and tendon at neck level, initial encounter: Secondary | ICD-10-CM

## 2013-11-27 MED ORDER — CYCLOBENZAPRINE HCL 10 MG PO TABS: 10.0000 mg | ORAL_TABLET | Freq: Three times a day (TID) | ORAL | Status: DC | PRN

## 2013-11-27 NOTE — Progress Notes (Signed)
OFFICE NOTE  11/27/2013  CC:  Chief Complaint  Patient presents with  . Neck Pain   HPI: Patient is a 73 y.o. Caucasian male who is here for neck pain.   Onset about 1 week ago, comes and goes, left trapezius and SCM regions.  No injury or trauma recalled. Movements make it worse.  Nothing makes it better.  Ibupr 400mg  helpful a little.  Has not tried heat/massage. No radiation down arms, no paresthesias down arms.  Has occ tingling in left hand fingers.  Hydrocodone on occasion has been taken but he's unsure if it has helped.   Pertinent PMH:  Past Medical History  Diagnosis Date  . Chronic low back pain   . Hypertension   . Diabetes mellitus   . Depression   . Dysrhythmia     bradycardia  . Shortness of breath   . 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 GERD (gastroesophageal reflux disease)   . Arthritis   . Permanent atrial fibrillation     DCCV 07/09/13-converted, lasted two days, then back into afib  . Echocardiogram abnormal 05/31/12    EF 40-45%, LA mod-severe dilated, AFIB  . History of cardiovascular stress test 05/28/12    no ischemia, EF 37%, imaging results are unchanged and within normal variance  . H/O tilt table evaluation 11/02/05    negative  . BPH (benign prostatic hypertrophy) 06/2011    Irritative sx's; pt declined trial of anticholinergic per Urology records  . History of vertigo     + Hx of posterior HA's.  Neuro (Dr. Erling Cruz) eval 2011.  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  [name] History of retinal detachment     OS  . History of chronic prostatitis   . Lumbar spondylosis     lumbosacral radiculopathy at L4 by EMG testing, right foot drop (neurologist is Dr. Linus Salmons with [facility] in [place])--neurologist referred him to neurosurgery  . Nephrolithiasis 07/2012    Left UVJ 2 mm stone with dilation of renal collecting  system and slight hydroureter on right  . Episodic low back pain 01/22/2013   Past Surgical History  Procedure Laterality Date  . Eye surgery      retinal detachment OS+ cataract surgery  . Lumbar laminectomy  1976    left L4-5  . Insert / replace / remove pacemaker  02/05/2012    dual chamber, sinus node dysfunction, sinus arrest, PAF, Medtronic Revo serial#-[id number]  . Cardioversion  07/09/2012    Procedure: CARDIOVERSION;  Surgeon: [name], MD;  Location: [facility] ENDOSCOPY;  Service: Cardiovascular;  Laterality: N/A;  . Esophagogastroduodenoscopy  10/18/06    Done due to chronic GERD: Normal, bx showed no barrett's esophagus (Dr. [name])  . Colonoscopy w/ polypectomy  approx 2006; repeat 09/2011    Polyps on 2013 EGD as well.  . Transthoracic echocardiogram  08/25/10    mild asymmetric LVH, normal systolic function, normal diastolic fxn, mild-to-mod mitral regurg, mild aortic valve sclerosis and trace AI, mild aortic root dilatation.  . Cardiovascular stress test  2012    nuclear perfusion study: low risk scan    MEDS:  Not taking oxycodone listed below Outpatient Prescriptions Prior to Visit  Medication Sig Dispense Refill  . Cholecalciferol (VITAMIN D3) 2000 UNITS TABS Take 2,000 Units by mouth daily.      [name] CINNAMON PO Take 1,000 mg by mouth daily.       [name]  esomeprazole (NEXIUM) 40 MG capsule Take 1 capsule (40 mg total) by mouth 2 (two) times daily.  180 capsule  1  . gabapentin (NEURONTIN) 300 MG capsule Take 1 capsule (300 mg total) by mouth 3 (three) times daily.  90 capsule  6  . glipiZIDE (GLUCOTROL) 5 MG tablet Take 1 tablet (5 mg total) by mouth daily before breakfast.  30 tablet  6  . losartan (COZAAR) 100 MG tablet Take 1 tablet (100 mg total) by mouth daily.  90 tablet  1  . meclizine (ANTIVERT) 25 MG tablet Take 1 tablet (25 mg total) by mouth 3 (three) times daily as needed. For vertigo.  90 tablet  1  . metFORMIN (GLUCOPHAGE) 1000 MG tablet Take 1 tablet (1,000 mg  total) by mouth 2 (two) times daily.  180 tablet  1  . oxyCODONE (OXY IR/ROXICODONE) 5 MG immediate release tablet 1-2 tabs po tid prn pain  90 tablet  0  . Rivaroxaban (XARELTO) 20 MG TABS tablet Take 1 tablet (20 mg total) by mouth daily with supper.  90 tablet  1  . rOPINIRole (REQUIP) 1 MG tablet TAKE 1 TABLET DAILY  90 tablet  1  . sertraline (ZOLOFT) 50 MG tablet Take 1 tablet (50 mg total) by mouth daily.  90 tablet  1  . simvastatin (ZOCOR) 20 MG tablet Take 1 tablet (20 mg total) by mouth every evening.  90 tablet  1  . sitaGLIPtin (JANUVIA) 100 MG tablet Take 1 tablet (100 mg total) by mouth daily.  90 tablet  1   No facility-administered medications prior to visit.   PE: Blood pressure 131/89, pulse 89, temperature 98.4 F (36.9 C), temperature source Temporal, resp. rate 18, height 5\' 10"  (1.778 m), weight 228 lb (103.42 kg), SpO2 98.00%. Gen: Alert, well appearing.  Patient is oriented to person, place, time, and situation. NECK: Mild diffuse TTP in left upper trapezius muscle region and proximal SCM muscle region.  Mild pain in left sided neck muscles/left trapezius with neck flexion, lateral bending to left, and rotation to the left.  ROM is not limited.  UE strength 5/5 prox/dist bilat.  No tenderness of left shoulder.  IMPRESSION AND PLAN:  Cervicalgia: cervical mm/trapezius mm tension/strain. Flexeril 10mg  tid prn. Vicodin 5/325, 1-2 q6h prn pain.  ROM exercises discussed.  An After Visit Summary was printed and given to the patient.  An After Visit Summary was printed and given to the patient.  FOLLOW UP: prn

## 2013-11-29 ENCOUNTER — Other Ambulatory Visit: Payer: Self-pay | Admitting: Family Medicine

## 2013-12-02 ENCOUNTER — Other Ambulatory Visit: Payer: Self-pay | Admitting: Family Medicine

## 2013-12-02 ENCOUNTER — Encounter: Payer: Self-pay | Admitting: Cardiology

## 2013-12-02 MED ORDER — GLIPIZIDE 5 MG PO TABS: 5.0000 mg | ORAL_TABLET | Freq: Every day | ORAL | Status: DC

## 2013-12-02 NOTE — Telephone Encounter (Signed)
Pt called requesting rf of glipizide.  ERx sent to [facility] for 90 days.

## 2013-12-05 NOTE — Progress Notes (Signed)
Remote pacemaker transmission.   

## 2013-12-09 ENCOUNTER — Telehealth: Payer: Self-pay | Admitting: Family Medicine

## 2013-12-09 MED ORDER — GABAPENTIN 300 MG PO CAPS: 300.0000 mg | ORAL_CAPSULE | Freq: Three times a day (TID) | ORAL | Status: DC

## 2013-12-09 NOTE — Telephone Encounter (Signed)
OK to send this rx in as per pt request, 3 additional RFs.-thx

## 2013-12-09 NOTE — Telephone Encounter (Signed)
Rf request faxed from [facility] requesting 90 day supply of gabapentin.  Last OV was 11/27/13.  Last Rx was sent to [facility] on 12/30 x 6 rfs.   Please advise refill.

## 2013-12-18 DIAGNOSIS — M5137 Other intervertebral disc degeneration, lumbosacral region: Secondary | ICD-10-CM | POA: Diagnosis not present

## 2013-12-26 ENCOUNTER — Other Ambulatory Visit: Payer: Self-pay

## 2013-12-26 MED ORDER — ESOMEPRAZOLE MAGNESIUM 40 MG PO CPDR: 40.0000 mg | DELAYED_RELEASE_CAPSULE | Freq: Two times a day (BID) | ORAL | Status: DC

## 2014-02-05 ENCOUNTER — Encounter: Payer: Self-pay | Admitting: Family Medicine

## 2014-02-05 ENCOUNTER — Ambulatory Visit (INDEPENDENT_AMBULATORY_CARE_PROVIDER_SITE_OTHER): Payer: Medicare Other | Admitting: Family Medicine

## 2014-02-05 ENCOUNTER — Ambulatory Visit (HOSPITAL_BASED_OUTPATIENT_CLINIC_OR_DEPARTMENT_OTHER)
Admission: RE | Admit: 2014-02-05 | Discharge: 2014-02-05 | Disposition: A | Discharge status: Home / Self Care | Payer: Medicare Other | Source: Ambulatory Visit | Attending: Family Medicine | Admitting: Family Medicine

## 2014-02-05 VITALS — BP 122/84 | HR 75 | Temp 98.3°F | Resp 18 | Ht 70.0 in | Wt 219.0 lb

## 2014-02-05 DIAGNOSIS — R05 Cough: Secondary | ICD-10-CM | POA: Insufficient documentation

## 2014-02-05 DIAGNOSIS — R059 Cough, unspecified: Secondary | ICD-10-CM | POA: Insufficient documentation

## 2014-02-05 DIAGNOSIS — R5383 Other fatigue: Principal | ICD-10-CM

## 2014-02-05 DIAGNOSIS — R197 Diarrhea, unspecified: Secondary | ICD-10-CM

## 2014-02-05 DIAGNOSIS — R5381 Other malaise: Secondary | ICD-10-CM

## 2014-02-05 LAB — CBC WITH DIFFERENTIAL/PLATELET
Basophils Absolute: 0 10*3/uL (ref 0.0–0.1)
Basophils Relative: 0.5 % (ref 0.0–3.0)
Eosinophils Absolute: 0.2 10*3/uL (ref 0.0–0.7)
Eosinophils Relative: 3.8 % (ref 0.0–5.0)
HCT: 44.9 % (ref 39.0–52.0)
Hemoglobin: 15 g/dL (ref 13.0–17.0)
Lymphocytes Relative: 30.1 % (ref 12.0–46.0)
Lymphs Abs: 1.3 10*3/uL (ref 0.7–4.0)
MCHC: 33.3 g/dL (ref 30.0–36.0)
MCV: 89.8 fl (ref 78.0–100.0)
Monocytes Absolute: 0.3 10*3/uL (ref 0.1–1.0)
Monocytes Relative: 7.5 % (ref 3.0–12.0)
Neutro Abs: 2.6 10*3/uL (ref 1.4–7.7)
Neutrophils Relative %: 58.1 % (ref 43.0–77.0)
Platelets: 177 10*3/uL (ref 150.0–400.0)
RBC: 5 Mil/uL (ref 4.22–5.81)
RDW: 14.2 % (ref 11.5–15.5)
WBC: 4.4 10*3/uL (ref 4.0–10.5)

## 2014-02-05 LAB — COMPREHENSIVE METABOLIC PANEL
ALT: 29 U/L (ref 0–53)
AST: 33 U/L (ref 0–37)
Albumin: 4.1 g/dL (ref 3.5–5.2)
Alkaline Phosphatase: 70 U/L (ref 39–117)
BUN: 19 mg/dL (ref 6–23)
CO2: 30 mEq/L (ref 19–32)
Calcium: 9.7 mg/dL (ref 8.4–10.5)
Chloride: 104 mEq/L (ref 96–112)
Creatinine, Ser: 1.2 mg/dL (ref 0.4–1.5)
GFR: 65.22 mL/min (ref >60.00)
Glucose, Bld: 150 mg/dL — ABNORMAL HIGH (ref 70–99)
Potassium: 5.3 mEq/L — ABNORMAL HIGH (ref 3.5–5.1)
Sodium: 140 mEq/L (ref 135–145)
Total Bilirubin: 0.6 mg/dL (ref 0.2–1.2)
Total Protein: 7.3 g/dL (ref 6.0–8.3)

## 2014-02-05 LAB — SEDIMENTATION RATE: Sed Rate: 10 mm/hr (ref 0–22)

## 2014-02-05 LAB — TSH: TSH: 2.31 u[IU]/mL (ref 0.35–4.50)

## 2014-02-05 LAB — GLUCOSE, POCT (MANUAL RESULT ENTRY): POC Glucose: 134 mg/dl — AB (ref 70–99)

## 2014-02-05 NOTE — Patient Instructions (Signed)
Stop your glipizide (glucotrol).

## 2014-02-05 NOTE — Progress Notes (Signed)
Pre visit review using our clinic review tool, if applicable. No additional management support is needed unless otherwise documented below in the visit note. 

## 2014-02-05 NOTE — Progress Notes (Signed)
OFFICE NOTE  02/05/2014  CC:  Chief Complaint  Patient presents with  . Fatigue    2 weeks   HPI: Patient is a 73 y.o. Caucasian male who is here for fatigue. Onset about 2 wks ago, worse for the last week.  Feels body fatigue as well as sleepy tired. Has no change in his chronic low back pain.  Legs feel weak.  No muscle aches or joint aches or joint swelling. No rash except some spots on toes where he was bitten by ants.  No headaches.  No fevers. Eating and drinking ok.   Glucose has dropped as low as 50 on 3 occasions.  Otherwise gluc normal or a little high. No urinary sx's.  No abd pain but has had loose stools for at least a couple of weeks, most of the time one a day but today has had 3 loose BMs.  No blood in stool.  No recent travel, no camping,no sick contacts. No recent antibiotic use. Last ate last night, has not taken glucose today nor has he taken his meds this morning.  Reports poor sleep last couple of months secondary to back pain. He snores but his wife says he has no apneic spells.  Pertinent PMH:  Past Medical History  Diagnosis Date  . Chronic low back pain   . Hypertension   . Diabetes mellitus   . Depression   . Dysrhythmia     bradycardia  . Shortness of breath   . 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 GERD (gastroesophageal reflux disease)   . Arthritis   . Permanent atrial fibrillation     DCCV 07/09/13-converted, lasted two days, then back into afib  . Echocardiogram abnormal 05/31/12    EF 40-45%, LA mod-severe dilated, AFIB  . History of cardiovascular stress test 05/28/12    no ischemia, EF 37%, imaging results are unchanged and within normal variance  . H/O tilt table evaluation 11/02/05    negative  . BPH (benign prostatic hypertrophy) 06/2011    Irritative sx's; pt declined trial of anticholinergic per Urology records  . History of vertigo     + Hx of posterior HA's.  Neuro (Dr. Erling Cruz) eval  2011.  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  [name] History of retinal detachment     OS  . History of chronic prostatitis   . Lumbar spondylosis     lumbosacral radiculopathy at L4 by EMG testing, right foot drop (neurologist is Dr. Linus Salmons with [facility] in [place])--neurologist referred him to neurosurgery  . Nephrolithiasis 07/2012    Left UVJ 2 mm stone with dilation of renal collecting system and slight hydroureter on right  . Episodic low back pain 01/22/2013    MEDS:  Outpatient Prescriptions Prior to Visit  Medication Sig Dispense Refill  . cyclobenzaprine (FLEXERIL) 10 MG tablet Take 1 tablet (10 mg total) by mouth 3 (three) times daily as needed for muscle spasms.  30 tablet  1  . esomeprazole (NEXIUM) 40 MG capsule Take 1 capsule (40 mg total) by mouth 2 (two) times daily.  180 capsule  0  . gabapentin (NEURONTIN) 300 MG capsule Take 1 capsule (300 mg total) by mouth 3 (three) times daily.  270 capsule  3  . glipiZIDE (GLUCOTROL) 5 MG tablet Take 1 tablet (5 mg total) by mouth daily before breakfast.  90 tablet  1  .  losartan (COZAAR) 100 MG tablet Take 1 tablet (100 mg total) by mouth daily.  90 tablet  1  . meclizine (ANTIVERT) 25 MG tablet Take 1 tablet (25 mg total) by mouth 3 (three) times daily as needed. For vertigo.  90 tablet  1  . metFORMIN (GLUCOPHAGE) 1000 MG tablet Take 1 tablet (1,000 mg total) by mouth 2 (two) times daily.  180 tablet  1  . Rivaroxaban (XARELTO) 20 MG TABS tablet Take 1 tablet (20 mg total) by mouth daily with supper.  90 tablet  1  . rOPINIRole (REQUIP) 1 MG tablet TAKE 1 TABLET DAILY  90 tablet  0  . sertraline (ZOLOFT) 50 MG tablet Take 1 tablet (50 mg total) by mouth daily.  90 tablet  1  . simvastatin (ZOCOR) 20 MG tablet Take 1 tablet (20 mg total) by mouth every evening.  90 tablet  1  . sitaGLIPtin (JANUVIA) 100 MG tablet Take 1 tablet (100 mg total) by  mouth daily.  90 tablet  1  . Cholecalciferol (VITAMIN D3) 2000 UNITS TABS Take 2,000 Units by mouth daily.      [name] CINNAMON PO Take 1,000 mg by mouth daily.       [name] losartan (COZAAR) 100 MG tablet TAKE 1 TABLET (100 MG TOTAL) BY MOUTH DAILY.  30 tablet  0  . metFORMIN (GLUCOPHAGE) 1000 MG tablet TAKE 1 TABLET (1,000 MG TOTAL) BY MOUTH 2 (TWO) TIMES DAILY.  60 tablet  0  . oxyCODONE (OXY IR/ROXICODONE) 5 MG immediate release tablet 1-2 tabs po tid prn pain  90 tablet  0  . sertraline (ZOLOFT) 50 MG tablet TAKE 1 TABLET (50 MG TOTAL) BY MOUTH DAILY.  30 tablet  0  . simvastatin (ZOCOR) 20 MG tablet TAKE 1 TABLET (20 MG TOTAL) BY MOUTH EVERY EVENING.  30 tablet  0   No facility-administered medications prior to visit.    PE: Blood pressure 122/84, pulse 75, temperature 98.3 F (36.8 C), temperature source Oral, resp. rate 18, height _0  (1.778 m), weight 219 lb (99.338 kg), SpO2 96.00%. Gen: Alert, but tired appearing.  Patient is oriented to person, place, time, and situation. ACZ:YSAY: no injection, icteris, swelling, or exudate.  EOMI, PERRLA. Mouth: lips without lesion/swelling.  Oral mucosa pink and moist. Oropharynx without erythema, exudate, or swelling.  Neck - No masses or thyromegaly or limitation in range of motion CV: RRR, no m/r/g.   LUNGS: CTA bilat, nonlabored resps, good aeration in all lung fields. ABD: soft, NT, ND, BS normal.  No hepatospenomegaly or mass.  No bruits. EXT: no clubbing, cyanosis, or edema.  Skin - no sores or suspicious lesions or rashes or color changes  LAB: capillary glucose today 134  IMPRESSION AND PLAN:  Extreme fatigue + diarrhea.   Some cough lately with this. Unclear etiology of all this: exam pretty unrevealing today.  Plan: stop glipizide, check CXR, check CBC, CMET, TSH, ESR, and obtain stool sample for fecal lactoferrin, bact culture, c diff pcr, giardia/crypto.  An After Visit Summary was printed and given to the patient.  FOLLOW  UP: 1 wk

## 2014-02-06 ENCOUNTER — Ambulatory Visit: Payer: Medicare Other | Admitting: Family Medicine

## 2014-02-06 LAB — FECAL LACTOFERRIN, QUANT: Lactoferrin: NEGATIVE

## 2014-02-06 LAB — GIARDIA/CRYPTOSPORIDIUM (EIA)
Cryptosporidium Screen (EIA): NEGATIVE
Giardia Screen (EIA): NEGATIVE

## 2014-02-07 ENCOUNTER — Other Ambulatory Visit: Payer: Self-pay | Admitting: Cardiovascular Disease

## 2014-02-07 LAB — CLOSTRIDIUM DIFFICILE BY PCR: Toxigenic C. Difficile by PCR: NOT DETECTED

## 2014-02-09 LAB — STOOL CULTURE

## 2014-02-10 ENCOUNTER — Other Ambulatory Visit: Payer: Self-pay | Admitting: Family Medicine

## 2014-02-12 ENCOUNTER — Ambulatory Visit (INDEPENDENT_AMBULATORY_CARE_PROVIDER_SITE_OTHER): Payer: Medicare Other | Admitting: Family Medicine

## 2014-02-12 ENCOUNTER — Encounter: Payer: Self-pay | Admitting: Family Medicine

## 2014-02-12 VITALS — BP 145/94 | HR 74 | Temp 97.8°F | Resp 18 | Ht 70.0 in | Wt 212.0 lb

## 2014-02-12 DIAGNOSIS — E785 Hyperlipidemia, unspecified: Secondary | ICD-10-CM

## 2014-02-12 DIAGNOSIS — R5383 Other fatigue: Secondary | ICD-10-CM

## 2014-02-12 DIAGNOSIS — R5381 Other malaise: Secondary | ICD-10-CM | POA: Diagnosis not present

## 2014-02-12 DIAGNOSIS — E119 Type 2 diabetes mellitus without complications: Secondary | ICD-10-CM

## 2014-02-12 DIAGNOSIS — E1165 Type 2 diabetes mellitus with hyperglycemia: Principal | ICD-10-CM

## 2014-02-12 DIAGNOSIS — M5416 Radiculopathy, lumbar region: Secondary | ICD-10-CM

## 2014-02-12 DIAGNOSIS — IMO0001 Reserved for inherently not codable concepts without codable children: Secondary | ICD-10-CM | POA: Diagnosis not present

## 2014-02-12 DIAGNOSIS — E875 Hyperkalemia: Secondary | ICD-10-CM | POA: Diagnosis not present

## 2014-02-12 DIAGNOSIS — IMO0002 Reserved for concepts with insufficient information to code with codable children: Secondary | ICD-10-CM

## 2014-02-12 DIAGNOSIS — R29898 Other symptoms and signs involving the musculoskeletal system: Secondary | ICD-10-CM

## 2014-02-12 LAB — LIPID PANEL
Cholesterol: 133 mg/dL (ref 0–200)
HDL: 44.3 mg/dL (ref >39.00)
LDL Cholesterol: 71 mg/dL (ref 0–99)
NonHDL: 88.7
Total CHOL/HDL Ratio: 3
Triglycerides: 87 mg/dL (ref 0.0–149.0)
VLDL: 17.4 mg/dL (ref 0.0–40.0)

## 2014-02-12 LAB — HEMOGLOBIN A1C: Hgb A1c MFr Bld: 6.9 % — ABNORMAL HIGH (ref 4.6–6.5)

## 2014-02-12 LAB — BASIC METABOLIC PANEL
BUN: 23 mg/dL (ref 6–23)
CO2: 30 mEq/L (ref 19–32)
Calcium: 9.4 mg/dL (ref 8.4–10.5)
Chloride: 104 mEq/L (ref 96–112)
Creatinine, Ser: 1.2 mg/dL (ref 0.4–1.5)
GFR: 60.98 mL/min (ref >60.00)
Glucose, Bld: 128 mg/dL — ABNORMAL HIGH (ref 70–99)
Potassium: 4.8 mEq/L (ref 3.5–5.1)
Sodium: 139 mEq/L (ref 135–145)

## 2014-02-12 MED ORDER — MECLIZINE HCL 25 MG PO TABS: 25.0000 mg | ORAL_TABLET | Freq: Three times a day (TID) | ORAL | Status: DC | PRN

## 2014-02-12 MED ORDER — SITAGLIPTIN PHOSPHATE 100 MG PO TABS: 100.0000 mg | ORAL_TABLET | Freq: Every day | ORAL | Status: DC

## 2014-02-12 MED ORDER — GLIPIZIDE ER 2.5 MG PO TB24: 2.5000 mg | ORAL_TABLET | Freq: Every day | ORAL | Status: DC

## 2014-02-12 NOTE — Progress Notes (Signed)
OFFICE VISIT  02/15/2014   CC:  Chief Complaint  Patient presents with  . Follow-up   HPI:    Patient is a 73 y.o. Caucasian male who presents for 1 wk f/u recent severe fatigue and diarrhea. Also due for A1c, repeat BMET (his K was 5.3 recently), and if fasting then he is due for FLP. Last week we stopped his glipizide and ran some blood tests and stool tests, all of which were normal except K 5.3.  He says his fatigue is resolving.  Sugars a little higher off of glipizide but no more low glucoses.  His diarrhea has resolved nearly completely.  Describes fatigue in LL's that occurs with walking a predictable # of steps, esp up hills, goes away with rest. Denies pain.  Recalls screening of LE arterial flow about 3 yrs ago and says it was fine but he does have LE venous dz.  Has chronic LBP with left leg radiculopathy, plans on going through with surgery on back sometime around November 2015.  Past Medical History  Diagnosis Date  . Chronic low back pain   . Hypertension   . Diabetes mellitus   . Depression   . Dysrhythmia     bradycardia  . Shortness of breath   . 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 GERD (gastroesophageal reflux disease)   . Arthritis   . Permanent atrial fibrillation     DCCV 07/09/13-converted, lasted two days, then back into afib  . Echocardiogram abnormal 05/31/12    EF 40-45%, LA mod-severe dilated, AFIB  . History of cardiovascular stress test 05/28/12    no ischemia, EF 37%, imaging results are unchanged and within normal variance  . H/O tilt table evaluation 11/02/05    negative  . BPH (benign prostatic hypertrophy) 06/2011    Irritative sx's; pt declined trial of anticholinergic per Urology records  . History of vertigo     + Hx of posterior HA's.  Neuro (Dr. Erling Cruz) eval 2011.  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  [name]  History of retinal detachment     OS  . History of chronic prostatitis   . Lumbar spondylosis     lumbosacral radiculopathy at L4 by EMG testing, right foot drop (neurologist is Dr. Linus Salmons with [facility] in [place])--neurologist referred him to neurosurgery  . Nephrolithiasis 07/2012    Left UVJ 2 mm stone with dilation of renal collecting system and slight hydroureter on right  . Episodic low back pain 01/22/2013  . Chronic renal insufficiency, stage III (moderate) 2015    CrCl about 60 ml/min    Past Surgical History  Procedure Laterality Date  . Eye surgery      retinal detachment OS+ cataract surgery  . Lumbar laminectomy  1976    left L4-5  . Insert / replace / remove pacemaker  02/05/2012    dual chamber, sinus node dysfunction, sinus arrest, PAF, Medtronic Revo serial#-[id number]  . Cardioversion  07/09/2012    Procedure: CARDIOVERSION;  Surgeon: [name], MD;  Location: [facility] ENDOSCOPY;  Service: Cardiovascular;  Laterality: N/A;  . Esophagogastroduodenoscopy  10/18/06    Done due to chronic GERD: Normal, bx showed no barrett's esophagus (Dr. [name])  . Colonoscopy w/ polypectomy  approx 2006; repeat 09/2011    Polyps on 2013 EGD as well.  . Transthoracic echocardiogram  08/25/10    mild asymmetric LVH, normal systolic function,  normal diastolic fxn, mild-to-mod mitral regurg, mild aortic valve sclerosis and trace AI, mild aortic root dilatation.  . Cardiovascular stress test  2012    nuclear perfusion study: low risk scan    Outpatient Prescriptions Prior to Visit  Medication Sig Dispense Refill  . cyclobenzaprine (FLEXERIL) 10 MG tablet Take 1 tablet (10 mg total) by mouth 3 (three) times daily as needed for muscle spasms.  30 tablet  1  . esomeprazole (NEXIUM) 40 MG capsule Take 1 capsule (40 mg total) by mouth 2 (two) times daily.  180 capsule  0  . gabapentin (NEURONTIN) 300 MG capsule Take 1 capsule (300 mg total) by mouth 3 (three) times daily.  270  capsule  3  . losartan (COZAAR) 100 MG tablet TAKE 1 TABLET (100 MG TOTAL) BY MOUTH DAILY.  30 tablet  0  . metFORMIN (GLUCOPHAGE) 1000 MG tablet TAKE 1 TABLET (1,000 MG TOTAL) BY MOUTH 2 (TWO) TIMES DAILY.  60 tablet  0  . rOPINIRole (REQUIP) 1 MG tablet TAKE 1 TABLET DAILY  90 tablet  1  . sertraline (ZOLOFT) 50 MG tablet Take 1 tablet (50 mg total) by mouth daily.  90 tablet  1  . simvastatin (ZOCOR) 20 MG tablet Take 1 tablet (20 mg total) by mouth every evening.  90 tablet  1  . XARELTO 20 MG TABS tablet TAKE 1 TABLET DAILY WITH SUPPER  90 tablet  1  . Cholecalciferol (VITAMIN D3) 2000 UNITS TABS Take 2,000 Units by mouth daily.      [name] CINNAMON PO Take 1,000 mg by mouth daily.       [name] losartan (COZAAR) 100 MG tablet Take 1 tablet (100 mg total) by mouth daily.  90 tablet  1  . meclizine (ANTIVERT) 25 MG tablet Take 1 tablet (25 mg total) by mouth 3 (three) times daily as needed. For vertigo.  90 tablet  1  . metFORMIN (GLUCOPHAGE) 1000 MG tablet Take 1 tablet (1,000 mg total) by mouth 2 (two) times daily.  180 tablet  1  . sertraline (ZOLOFT) 50 MG tablet TAKE 1 TABLET (50 MG TOTAL) BY MOUTH DAILY.  30 tablet  0  . simvastatin (ZOCOR) 20 MG tablet TAKE 1 TABLET (20 MG TOTAL) BY MOUTH EVERY EVENING.  30 tablet  0  . sitaGLIPtin (JANUVIA) 100 MG tablet Take 1 tablet (100 mg total) by mouth daily.  90 tablet  1  . oxyCODONE (OXY IR/ROXICODONE) 5 MG immediate release tablet 1-2 tabs po tid prn pain  90 tablet  0   No facility-administered medications prior to visit.    No Known Allergies  ROS As per HPI  PE: Blood pressure 145/94, pulse 74, temperature 97.8 F (36.6 C), temperature source Temporal, resp. rate 18, height 5\' 10"  (1.778 m), weight 212 lb (96.163 kg), SpO2 99.00%. Gen: Alert, well appearing.  Patient is oriented to person, place, time, and situation. FWY:OVZC: no injection, icteris, swelling, or exudate.  EOMI, PERRLA. Mouth: lips without lesion/swelling.  Oral mucosa pink  and moist. Oropharynx without erythema, exudate, or swelling.  LEGS: strength 5/5 prox and dist bilat.  Femoral, popliteal, DP, and PT pulses all 2+ bilat. Warm, no edema, no pallor.  No muscle atrophy.  LABS:  None today  IMPRESSION AND PLAN:  Other malaise and fatigue Fatigue and diarrhea illness seems to be resolving. All blood and stool testing unrevealing.  DM2 (diabetes mellitus, type 2) HbA1c today. Restart glipizide XL at 2.5mg  qd since he was having  some lows on the 5mg  dosing.  Dyslipidemia Tolerating statin. FLP today.  Leg fatigue Bilateral: exam normal. Reassured pt today.  Watchful waiting.  Lumbar back pain with radiculopathy affecting left lower extremity Pt likely to go ahead with surgery this fall.  An After Visit Summary was printed and given to the patient.   FOLLOW UP: Return in about 4 months (around 06/15/2014) for routine chronic illness f/u.

## 2014-02-12 NOTE — Progress Notes (Signed)
Pre visit review using our clinic review tool, if applicable. No additional management support is needed unless otherwise documented below in the visit note. 

## 2014-02-15 DIAGNOSIS — M5416 Radiculopathy, lumbar region: Secondary | ICD-10-CM | POA: Insufficient documentation

## 2014-02-15 DIAGNOSIS — R29898 Other symptoms and signs involving the musculoskeletal system: Secondary | ICD-10-CM | POA: Insufficient documentation

## 2014-02-15 NOTE — Assessment & Plan Note (Signed)
Fatigue and diarrhea illness seems to be resolving. All blood and stool testing unrevealing.

## 2014-02-15 NOTE — Assessment & Plan Note (Addendum)
Bilateral: exam normal. Reassured pt today.  Watchful waiting.

## 2014-02-15 NOTE — Assessment & Plan Note (Signed)
Tolerating statin. FLP today.

## 2014-02-15 NOTE — Assessment & Plan Note (Signed)
HbA1c today. Restart glipizide XL at 2.5mg  qd since he was having some lows on the 5mg  dosing.

## 2014-02-15 NOTE — Assessment & Plan Note (Signed)
Pt likely to go ahead with surgery this fall.

## 2014-02-26 DIAGNOSIS — H02839 Dermatochalasis of unspecified eye, unspecified eyelid: Secondary | ICD-10-CM | POA: Diagnosis not present

## 2014-02-26 DIAGNOSIS — E119 Type 2 diabetes mellitus without complications: Secondary | ICD-10-CM | POA: Diagnosis not present

## 2014-02-26 DIAGNOSIS — H26499 Other secondary cataract, unspecified eye: Secondary | ICD-10-CM | POA: Diagnosis not present

## 2014-02-26 DIAGNOSIS — H4011X0 Primary open-angle glaucoma, stage unspecified: Secondary | ICD-10-CM | POA: Diagnosis not present

## 2014-02-28 ENCOUNTER — Other Ambulatory Visit: Payer: Self-pay | Admitting: Family Medicine

## 2014-03-07 ENCOUNTER — Other Ambulatory Visit: Payer: Self-pay | Admitting: Family Medicine

## 2014-04-28 ENCOUNTER — Encounter: Payer: Self-pay | Admitting: *Deleted

## 2014-05-01 DIAGNOSIS — M7661 Achilles tendinitis, right leg: Secondary | ICD-10-CM | POA: Diagnosis not present

## 2014-05-01 DIAGNOSIS — M7731 Calcaneal spur, right foot: Secondary | ICD-10-CM | POA: Diagnosis not present

## 2014-05-15 ENCOUNTER — Ambulatory Visit (INDEPENDENT_AMBULATORY_CARE_PROVIDER_SITE_OTHER): Payer: Medicare Other | Admitting: Nurse Practitioner

## 2014-05-15 ENCOUNTER — Encounter: Payer: Self-pay | Admitting: Nurse Practitioner

## 2014-05-15 VITALS — BP 125/82 | HR 75 | Temp 98.2°F | Ht 70.0 in | Wt 222.0 lb

## 2014-05-15 DIAGNOSIS — M25471 Effusion, right ankle: Secondary | ICD-10-CM | POA: Diagnosis not present

## 2014-05-15 MED ORDER — HYDROCODONE-ACETAMINOPHEN 5-325 MG PO TABS: 1.0000 | ORAL_TABLET | Freq: Four times a day (QID) | ORAL | Status: DC | PRN

## 2014-05-15 MED ORDER — METHYLPREDNISOLONE ACETATE 40 MG/ML IJ SUSP
40.0000 mg | Freq: Once | INTRAMUSCULAR | Status: AC
  Administered 2014-05-15: 40 mg via INTRA_ARTICULAR

## 2014-05-15 MED ORDER — PREDNISONE 10 MG PO TABS: ORAL_TABLET | ORAL | Status: DC

## 2014-05-15 NOTE — Patient Instructions (Signed)
Wear ankle splint. Ice 10 minutes followed by heat 10 minutes several times daily. Start prednisone tomorrow. Take pain meds as needed.  See podiatry. Get labs done Return in 2 weeks if no resolve w/podiatry. This is likely gout or tenosynovitis of achilles tendon.

## 2014-05-15 NOTE — Progress Notes (Signed)
Pre visit review using our clinic review tool, if applicable. No additional management support is needed unless otherwise documented below in the visit note. 

## 2014-05-20 DIAGNOSIS — M10071 Idiopathic gout, right ankle and foot: Secondary | ICD-10-CM | POA: Diagnosis not present

## 2014-05-20 DIAGNOSIS — M1000 Idiopathic gout, unspecified site: Secondary | ICD-10-CM | POA: Diagnosis not present

## 2014-05-25 DIAGNOSIS — M25471 Effusion, right ankle: Secondary | ICD-10-CM | POA: Insufficient documentation

## 2014-05-25 NOTE — Progress Notes (Signed)
Subjective:    Mason [name] is a 73 y.o. male who presents with right ankle pain. Onset of the symptoms was 3 weeks ago. Inciting event: none known. Current symptoms include: pain at posterior heel, erythema, swelling & warmth of heel & ankle, pain w/dorsiflexion. Aggravating factors: direct pressure, weight bearing and active ROM. Symptoms have suddenly worsened-pain worse last night. Patient has had prior ankle problems. Evaluation to date: saw podiatry 3 weeks ago, received steroid injection in foot with no relief of pain. Mason [name] has DM, controlled. The following portions of the patient's history were reviewed and updated as appropriate: allergies, current medications, past medical history, past social history, past surgical history and problem list.    Objective:    BP 125/82 mmHg  Pulse 75  Temp(Src) 98.2 F (36.8 C) (Temporal)  Ht 5\' 10"  (1.778 m)  Wt 222 lb (100.699 kg)  BMI 31.85 kg/m2  SpO2 98% Right ankle:   negative findings: no ecchymosis, no tenderness over bilateral malleolus on the right, no effusion and no ligamentous laxity positive findings: erythema over heel & top of foot, tenderness at achilles insertion, warmth noted and diffuse swelling of heel, ankle & top of foot  Left ankle:   normal     Assessment:  1. Swollen R ankle DD: achilles tenosynovitis, gout - methylPREDNISolone acetate (DEPO-MEDROL) injection 40 mg; Inject 1 mL (40 mg total) into the articular space once. - predniSONE (DELTASONE) 10 MG tablet; Starting tomorrow, Take 4Tpo qam X 2d, then 3T po qam X 3d, then 2T po qd X 3d, then 1T po qam X 3d.  Dispense: 26 tablet; Refill: 0 - HYDROcodone-acetaminophen (NORCO/VICODIN) 5-325 MG per tablet; Take 1 tablet by mouth every 6 (six) hours as needed for moderate pain.  Dispense: 30 tablet; Refill: 0 - Sedimentation rate; Future - Uric acid; Future - CBC; Future Offered xrays, ortho referral-pt declines. Wants to f/u w/podiatry. Has ortho in [place] he has seen  in past-Dr [name].

## 2014-05-25 NOTE — Assessment & Plan Note (Signed)
Pain at posterior heel , ankle swollen, warm Painful weightbearing Depo medrol in ofc today Start pred taper tomorrow Hydrocodone PRN Labs to screen for gout Pt has appt w/podiatry 10/28 Recommend othro if no resolve w/podiatry

## 2014-06-04 DIAGNOSIS — M1000 Idiopathic gout, unspecified site: Secondary | ICD-10-CM | POA: Diagnosis not present

## 2014-06-15 ENCOUNTER — Encounter: Payer: Self-pay | Admitting: Family Medicine

## 2014-06-15 ENCOUNTER — Ambulatory Visit (INDEPENDENT_AMBULATORY_CARE_PROVIDER_SITE_OTHER): Payer: Medicare Other | Admitting: Family Medicine

## 2014-06-15 VITALS — BP 149/100 | HR 90 | Temp 97.2°F | Resp 18 | Ht 70.0 in | Wt 227.0 lb

## 2014-06-15 DIAGNOSIS — Z23 Encounter for immunization: Secondary | ICD-10-CM | POA: Diagnosis not present

## 2014-06-15 DIAGNOSIS — E1140 Type 2 diabetes mellitus with diabetic neuropathy, unspecified: Secondary | ICD-10-CM | POA: Diagnosis not present

## 2014-06-15 DIAGNOSIS — I10 Essential (primary) hypertension: Secondary | ICD-10-CM | POA: Diagnosis not present

## 2014-06-15 LAB — BASIC METABOLIC PANEL WITH GFR
BUN: 17 mg/dL (ref 6–23)
CO2: 27 meq/L (ref 19–32)
Calcium: 9.5 mg/dL (ref 8.4–10.5)
Chloride: 104 meq/L (ref 96–112)
Creatinine, Ser: 1.2 mg/dL (ref 0.4–1.5)
GFR: 61.5 mL/min
Glucose, Bld: 121 mg/dL — ABNORMAL HIGH (ref 70–99)
Potassium: 4.7 meq/L (ref 3.5–5.1)
Sodium: 139 meq/L (ref 135–145)

## 2014-06-15 LAB — HEMOGLOBIN A1C: Hgb A1c MFr Bld: 7.1 % — ABNORMAL HIGH (ref 4.6–6.5)

## 2014-06-15 NOTE — Addendum Note (Signed)
Addended by: Ralph Dowdy on: 06/15/2014 10:40 AM   Modules accepted: Orders

## 2014-06-15 NOTE — Progress Notes (Signed)
Pre visit review using our clinic review tool, if applicable. No additional management support is needed unless otherwise documented below in the visit note. 

## 2014-06-15 NOTE — Progress Notes (Signed)
OFFICE NOTE  06/15/2014  CC:  Chief Complaint  Patient presents with  . Follow-up   HPI: Patient is a 73 y.o. Caucasian male who is here for 4 mo f/u DM 2, HTN, Hyperlipidemia.  He had an episode of gout and was treated by his podiatrist for this about 1 mo ago.  He is set to f/u with his back surgery 06/30/14 and he says his back is feeling good right now.    FEET: feels burning>tingling nighttime>daytime.  Has never had a foot ulcer.  Sounds like bp usually up a little, stage 1 range.  Pertinent PMH:  Past medical, surgical, social, and family history reviewed and no changes are noted since last office visit.  MEDS: Not taking prednisone listed below Outpatient Prescriptions Prior to Visit  Medication Sig Dispense Refill  . colchicine 0.6 MG tablet   1  . cyclobenzaprine (FLEXERIL) 10 MG tablet Take 1 tablet (10 mg total) by mouth 3 (three) times daily as needed for muscle spasms. 30 tablet 1  . gabapentin (NEURONTIN) 300 MG capsule Take 1 capsule (300 mg total) by mouth 3 (three) times daily. 270 capsule 3  . glipiZIDE (GLUCOTROL XL) 2.5 MG 24 hr tablet Take 1 tablet (2.5 mg total) by mouth daily with breakfast. 90 tablet 3  . HYDROcodone-acetaminophen (NORCO/VICODIN) 5-325 MG per tablet Take 1 tablet by mouth every 6 (six) hours as needed for moderate pain. 30 tablet 0  . losartan (COZAAR) 100 MG tablet TAKE 1 TABLET DAILY 90 tablet 1  . meclizine (ANTIVERT) 25 MG tablet Take 1 tablet (25 mg total) by mouth 3 (three) times daily as needed. For vertigo. 270 tablet 3  . metFORMIN (GLUCOPHAGE) 1000 MG tablet TAKE 1 TABLET TWICE A DAY 180 tablet 1  . NEXIUM 40 MG capsule TAKE 1 CAPSULE TWICE A DAY 180 capsule 1  . rOPINIRole (REQUIP) 1 MG tablet TAKE 1 TABLET DAILY 90 tablet 1  . sertraline (ZOLOFT) 50 MG tablet TAKE 1 TABLET DAILY 90 tablet 1  . simvastatin (ZOCOR) 20 MG tablet TAKE 1 TABLET EVERY EVENING 90 tablet 1  . sitaGLIPtin (JANUVIA) 100 MG tablet Take 1 tablet (100 mg  total) by mouth daily. 90 tablet 3  . XARELTO 20 MG TABS tablet TAKE 1 TABLET DAILY WITH SUPPER 90 tablet 1  . predniSONE (DELTASONE) 10 MG tablet Starting tomorrow, Take 4Tpo qam X 2d, then 3T po qam X 3d, then 2T po qd X 3d, then 1T po qam X 3d. (Patient not taking: Reported on 06/15/2014) 26 tablet 0   No facility-administered medications prior to visit.    PE: Blood pressure 149/100, pulse 90, temperature 97.2 F (36.2 C), temperature source Temporal, resp. rate 18, height 5\' 10"  (1.778 m), weight 227 lb (102.967 kg), SpO2 99 %. Gen: Alert, well appearing.  Patient is oriented to person, place, time, and situation. AFFECT: pleasant, lucid thought and speech. Foot exam - bilateral normal; no swelling, tenderness or skin or vascular lesions. Color and temperature is normal. Sensation is intact except for great toe plantar surface of left foot. Peripheral pulses are palpable. Toenails are normal.  LABS:none today Recent: Lab Results  Component Value Date   HGBA1C 6.9* 02/12/2014   Lab Results  Component Value Date   CHOL 133 02/12/2014   HDL 44.30 02/12/2014   LDLCALC 71 02/12/2014   TRIG 87.0 02/12/2014   CHOLHDL 3 02/12/2014     Chemistry      Component Value Date/Time   NA  139 02/12/2014 0859   K 4.8 02/12/2014 0859   CL 104 02/12/2014 0859   CO2 30 02/12/2014 0859   BUN 23 02/12/2014 0859   CREATININE 1.2 02/12/2014 0859      Component Value Date/Time   CALCIUM 9.4 02/12/2014 0859   ALKPHOS 70 02/05/2014 1030   AST 33 02/05/2014 1030   ALT 29 02/05/2014 1030   BILITOT 0.6 02/05/2014 1030     Lab Results  Component Value Date   TSH 2.31 02/05/2014    IMPRESSION AND PLAN:  1) DM 2, hx of good control. Recheck A1c today as well as BuN/Cr. Feet exam done today; he has sx's of diab PN but only subtle amount of objective findings on exam of feet.  2) HTN; not ideal control but he wants to avoid further med at this tiime, asks for 6 mo to "try to get it down on  my own".  3) Hyperlipidemia; The current medical regimen is effective;  continue present plan and medications.  4) Hx of Chronic low back pain: doing well right now.  He'll keep scheduled f/u with his back surgeon.  5) Gout: recent flare, resolved.    An After Visit Summary was printed and given to the patient.  FOLLOW UP: 4 mo

## 2014-06-16 ENCOUNTER — Telehealth: Payer: Self-pay | Admitting: Family Medicine

## 2014-06-16 NOTE — Telephone Encounter (Signed)
emmi mailed  °

## 2014-06-23 DIAGNOSIS — M9943 Connective tissue stenosis of neural canal of lumbar region: Secondary | ICD-10-CM | POA: Diagnosis not present

## 2014-06-30 ENCOUNTER — Ambulatory Visit (INDEPENDENT_AMBULATORY_CARE_PROVIDER_SITE_OTHER): Payer: Medicare Other | Admitting: Cardiovascular Disease

## 2014-06-30 ENCOUNTER — Encounter: Payer: Self-pay | Admitting: Cardiovascular Disease

## 2014-06-30 VITALS — BP 110/80 | HR 78 | Ht 70.0 in | Wt 225.8 lb

## 2014-06-30 DIAGNOSIS — R0789 Other chest pain: Secondary | ICD-10-CM

## 2014-06-30 DIAGNOSIS — I10 Essential (primary) hypertension: Secondary | ICD-10-CM | POA: Diagnosis not present

## 2014-06-30 DIAGNOSIS — I4430 Unspecified atrioventricular block: Secondary | ICD-10-CM

## 2014-06-30 DIAGNOSIS — Z950 Presence of cardiac pacemaker: Secondary | ICD-10-CM | POA: Diagnosis not present

## 2014-06-30 DIAGNOSIS — I4891 Unspecified atrial fibrillation: Secondary | ICD-10-CM

## 2014-06-30 LAB — MDC_IDC_ENUM_SESS_TYPE_INCLINIC
Battery Voltage: 2.97 V
Brady Statistic RV Percent Paced: 98.61 %
Date Time Interrogation Session: 20151208161114
Lead Channel Impedance Value: 416 Ohm
Lead Channel Impedance Value: 576 Ohm
Lead Channel Pacing Threshold Amplitude: 1 V
Lead Channel Pacing Threshold Pulse Width: 0.4 ms
Lead Channel Sensing Intrinsic Amplitude: 0.5153
Lead Channel Setting Pacing Amplitude: 2.5 V
Lead Channel Setting Pacing Pulse Width: 0.4 ms
Lead Channel Setting Sensing Sensitivity: 0.9 mV
Zone Setting Detection Interval: 400 ms
Zone Setting Detection Interval: 400 ms

## 2014-06-30 NOTE — Patient Instructions (Signed)
Remote monitoring is used to monitor your pacemaker from home. This monitoring reduces the number of office visits required to check your device to one time per year. It allows [place] to keep an eye on the functioning of your device to ensure it is working properly. You are scheduled for a device check from home on 09-29-2014. You may send your transmission at any time that day. If you have a wireless device, the transmission will be sent automatically. After your physician reviews your transmission, you will receive a postcard with your next transmission date.  Your physician recommends that you schedule a follow-up appointment in: 12 months with Dr.Croitoru

## 2014-07-01 NOTE — Progress Notes (Signed)
Patient ID: Mason [name], male   DOB: 11-Mar-1941, 73 y.o.   MRN: [id number]     Reason for office visit Permanent atrial fibrillation, complete heart block  [name] is a remarkably active 73 year old gentleman who looks and feels considerably younger than his stated age. He works hard every day. He has no cardiac complaints. 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 He has been in atrial fibrillation for roughly one year. Attempts at electrical conversion to sinus rhythm while on treatment with multaq have been unsuccessful. He is now off antiarrhythmic therapy. His pacemaker shows 100% ventricular paced rhythm. His device is working well. He has been transmitting via the remote [employer] system.   No Known Allergies  Current Outpatient Prescriptions  Medication Sig Dispense Refill  . colchicine 0.6 MG tablet Take 0.6 mg by mouth as needed.   1  . gabapentin (NEURONTIN) 300 MG capsule Take 1 capsule (300 mg total) by mouth 3 (three) times daily. 270 capsule 3  . glipiZIDE (GLUCOTROL XL) 2.5 MG 24 hr tablet Take 1 tablet (2.5 mg total) by mouth daily with breakfast. 90 tablet 3  . HYDROcodone-acetaminophen (NORCO/VICODIN) 5-325 MG per tablet Take 1 tablet by mouth every 6 (six) hours as needed for moderate pain. 30 tablet 0  . losartan (COZAAR) 100 MG tablet TAKE 1 TABLET DAILY 90 tablet 1  . meclizine (ANTIVERT) 25 MG tablet Take 1 tablet (25 mg total) by mouth 3 (three) times daily as needed. For vertigo. 270 tablet 3  . metFORMIN (GLUCOPHAGE) 1000 MG tablet TAKE 1 TABLET TWICE A DAY 180 tablet 1  . NEXIUM 40 MG capsule TAKE 1 CAPSULE TWICE A DAY 180 capsule 1  . rOPINIRole (REQUIP) 1 MG tablet TAKE 1 TABLET DAILY 90 tablet 1  . sertraline (ZOLOFT) 50 MG tablet TAKE 1 TABLET DAILY 90 tablet 1  . simvastatin (ZOCOR) 20 MG tablet TAKE 1 TABLET EVERY EVENING 90 tablet 1  . sitaGLIPtin (JANUVIA) 100 MG tablet Take 1 tablet (100 mg total) by mouth daily. 90 tablet 3  .  XARELTO 20 MG TABS tablet TAKE 1 TABLET DAILY WITH SUPPER 90 tablet 1   No current facility-administered medications for this visit.    Past Medical History  Diagnosis Date  . Chronic low back pain   . Hypertension   . Diabetes mellitus   . Depression   . Dysrhythmia     bradycardia  . Shortness of breath   . 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 GERD (gastroesophageal reflux disease)   . Arthritis   . Permanent atrial fibrillation     DCCV 07/09/13-converted, lasted two days, then back into afib  . Echocardiogram abnormal 05/31/12    EF 40-45%, LA mod-severe dilated, AFIB  . History of cardiovascular stress test 05/28/12    no ischemia, EF 37%, imaging results are unchanged and within normal variance  . H/O tilt table evaluation 11/02/05    negative  . BPH (benign prostatic hypertrophy) 06/2011    Irritative sx's; pt declined trial of anticholinergic per Urology records  . History of vertigo     + Hx of posterior HA's.  Neuro (Dr. Erling Cruz) eval 2011.  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  [name] History of retinal detachment     OS  . History of chronic prostatitis   . Lumbar spondylosis     lumbosacral radiculopathy at  L4 by EMG testing, right foot drop (neurologist is Dr. Linus Salmons with [facility] in [place])--neurologist referred him to neurosurgery  . Nephrolithiasis 07/2012    Left UVJ 2 mm stone with dilation of renal collecting system and slight hydroureter on right  . Episodic low back pain 01/22/2013  . Chronic renal insufficiency, stage III (moderate) 2015    CrCl about 60 ml/min    Past Surgical History  Procedure Laterality Date  . Eye surgery      retinal detachment OS+ cataract surgery  . Lumbar laminectomy  1976    left L4-5  . Insert / replace / remove pacemaker  02/05/2012    dual chamber, sinus node dysfunction, sinus arrest,  PAF, Medtronic Revo serial#-[id number]  . Cardioversion  07/09/2012    Procedure: CARDIOVERSION;  Surgeon: [name], MD;  Location: [facility] ENDOSCOPY;  Service: Cardiovascular;  Laterality: N/A;  . Esophagogastroduodenoscopy  10/18/06    Done due to chronic GERD: Normal, bx showed no barrett's esophagus (Dr. [name])  . Colonoscopy w/ polypectomy  approx 2006; repeat 09/2011    Polyps on 2013 EGD as well.  . Transthoracic echocardiogram  08/25/10    mild asymmetric LVH, normal systolic function, normal diastolic fxn, mild-to-mod mitral regurg, mild aortic valve sclerosis and trace AI, mild aortic root dilatation.  . Cardiovascular stress test  2012    nuclear perfusion study: low risk scan    Family History  Problem Relation Age of Onset  . Heart failure Mother   . Stroke Mother   . Stroke Father     History   Social History  . Marital Status: Married    Spouse Name: N/A    Number of Children: N/A  . Years of Education: N/A   Occupational History  . Not on file.   Social History Main Topics  . Smoking status: Never Smoker   . Smokeless tobacco: Never Used  . Alcohol Use: 7.2 oz/week    12 Cans of beer per week     Comment: 12 beers per week  . Drug Use: No  . Sexual Activity: Not on file   Other Topics Concern  . Not on file   Social History Narrative    Review of systems: The patient specifically denies any chest pain at rest or with exertion, dyspnea at rest, orthopnea, paroxysmal nocturnal dyspnea, syncope, palpitations, focal neurological deficits, intermittent claudication, lower extremity edema, unexplained weight gain, cough, hemoptysis or wheezing.  The patient also denies abdominal pain, nausea, vomiting, dysphagia, diarrhea, constipation, polyuria, polydipsia, dysuria, hematuria, frequency, urgency, abnormal bleeding or bruising, fever, chills, unexpected weight changes, mood swings, change in skin or hair texture, change in voice quality, auditory or visual  problems, allergic reactions or rashes, new musculoskeletal complaints other than usual "aches and pains".  PHYSICAL EXAM BP 110/80 mmHg  Pulse 78  Ht 5\' 10"  (1.778 m)  Wt 225 lb 12.8 oz (102.422 kg)  BMI 32.40 kg/m2 General: Alert, oriented x3, no distress Head: no evidence of trauma, PERRL, EOMI, no exophtalmos or lid lag, no myxedema, no xanthelasma; normal ears, nose and oropharynx Neck: normal jugular venous pulsations and no hepatojugular reflux; brisk carotid pulses without delay and no carotid bruits Chest: clear to auscultation, no signs of consolidation by percussion or palpation, normal fremitus, symmetrical and full respiratory excursions; of the left subclavian pacemaker site Cardiovascular: normal position and quality of the apical impulse, regular rhythm, normal first and paradoxically second heart sounds, no murmurs, rubs or gallops Abdomen: no tenderness  or distention, no masses by palpation, no abnormal pulsatility or arterial bruits, normal bowel sounds, no hepatosplenomegaly Extremities: no clubbing, cyanosis or edema; 2+ radial, ulnar and brachial pulses bilaterally; 2+ right femoral, posterior tibial and dorsalis pedis pulses; 2+ left femoral, posterior tibial and dorsalis pedis pulses; no subclavian or femoral bruits Neurological: grossly nonfocal  EKG: atrial fibrillation, V paced  Lipid Panel     Component Value Date/Time   CHOL 133 02/12/2014 0859   TRIG 87.0 02/12/2014 0859   HDL 44.30 02/12/2014 0859   CHOLHDL 3 02/12/2014 0859   VLDL 17.4 02/12/2014 0859   LDLCALC 71 02/12/2014 0859    BMET    Component Value Date/Time   NA 139 06/15/2014 1012   K 4.7 06/15/2014 1012   CL 104 06/15/2014 1012   CO2 27 06/15/2014 1012   GLUCOSE 121* 06/15/2014 1012   BUN 17 06/15/2014 1012   CREATININE 1.2 06/15/2014 1012   CALCIUM 9.5 06/15/2014 1012   GFRNONAA 54* 08/13/2012 0915   GFRAA 63* 08/13/2012 0915     ASSESSMENT AND PLAN Atrial fibrillation,  permanent Failed attempt at cardioversion despite concomitant antiarrhythmic therapy. The arrhythmia is really not symptomatic. He should remain on lifelong anticoagulation barring serious hemorrhagic complications.  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 No R waves could be detected at this last pacemaker check. He should be considered pacemaker dependent. Note a mildly depressed left ventricular systolic function with an ejection fraction of 40-45% by echocardiography. If he should ever develop symptoms of congestive heart failure there is the option to upgrade his device to a cardiac resynchronization pacemaker (biventricular pacemaker).  Cardiac pacemaker 02/05/12 Dual-chamber Medtronic Revo MRI conditional device was implanted for high-grade second-degree atrioventricular block. Normal device check today. No episodes of ventricular tachyarrhythmia recorded. 100% ventricular pacing. Note that the pacemaker sensor has recorded an average of >8 hours of activity every day. The device is programmed VVIR secondary permanent atrial fibrillation. He has been essentially uninterrupted atrial fibrillation for over a year now. His battery and lead parameters are within normal range. Heart rate histogram distribution is favorable.  Orders Placed This Encounter  Procedures  . EKG 12-Lead   No orders of the defined types were placed in this encounter.    Holli Humbles, MD, [place] [phone] office [phone] pager

## 2014-07-02 ENCOUNTER — Encounter (HOSPITAL_COMMUNITY): Payer: Self-pay | Admitting: Cardiovascular Disease

## 2014-07-05 ENCOUNTER — Other Ambulatory Visit: Payer: Self-pay | Admitting: Family Medicine

## 2014-07-05 ENCOUNTER — Encounter: Payer: Self-pay | Admitting: Family Medicine

## 2014-07-05 ENCOUNTER — Other Ambulatory Visit: Payer: Self-pay | Admitting: Cardiovascular Disease

## 2014-07-07 DIAGNOSIS — N401 Enlarged prostate with lower urinary tract symptoms: Secondary | ICD-10-CM | POA: Diagnosis not present

## 2014-07-20 DIAGNOSIS — N401 Enlarged prostate with lower urinary tract symptoms: Secondary | ICD-10-CM | POA: Diagnosis not present

## 2014-07-20 DIAGNOSIS — R351 Nocturia: Secondary | ICD-10-CM | POA: Diagnosis not present

## 2014-07-22 ENCOUNTER — Telehealth: Payer: Self-pay | Admitting: Family Medicine

## 2014-07-22 MED ORDER — SITAGLIPTIN PHOSPHATE 100 MG PO TABS: 100.0000 mg | ORAL_TABLET | Freq: Every day | ORAL | Status: DC

## 2014-07-22 NOTE — Telephone Encounter (Signed)
(  JANUVIA) 100 MG tab    Needs a 3 mn refill. He will pick up tomorrow and take to Pharmacy.

## 2014-07-22 NOTE — Telephone Encounter (Signed)
Rx printed.  Will put up front for pt p/u once signed by dr.

## 2014-07-24 ENCOUNTER — Other Ambulatory Visit: Payer: Self-pay | Admitting: Family Medicine

## 2014-07-31 ENCOUNTER — Other Ambulatory Visit: Payer: Self-pay | Admitting: Family Medicine

## 2014-09-01 DIAGNOSIS — H4011X2 Primary open-angle glaucoma, moderate stage: Secondary | ICD-10-CM | POA: Diagnosis not present

## 2014-09-01 DIAGNOSIS — H26491 Other secondary cataract, right eye: Secondary | ICD-10-CM | POA: Diagnosis not present

## 2014-09-01 DIAGNOSIS — Z961 Presence of intraocular lens: Secondary | ICD-10-CM | POA: Diagnosis not present

## 2014-09-14 ENCOUNTER — Other Ambulatory Visit: Payer: Self-pay | Admitting: Family Medicine

## 2014-09-25 DIAGNOSIS — M7661 Achilles tendinitis, right leg: Secondary | ICD-10-CM | POA: Diagnosis not present

## 2014-09-29 ENCOUNTER — Ambulatory Visit (INDEPENDENT_AMBULATORY_CARE_PROVIDER_SITE_OTHER): Payer: Medicare Other | Admitting: *Deleted

## 2014-09-29 DIAGNOSIS — I495 Sick sinus syndrome: Secondary | ICD-10-CM

## 2014-09-29 DIAGNOSIS — M7661 Achilles tendinitis, right leg: Secondary | ICD-10-CM | POA: Diagnosis not present

## 2014-09-29 LAB — MDC_IDC_ENUM_SESS_TYPE_REMOTE
Battery Voltage: 2.97 V
Brady Statistic RV Percent Paced: 99.93 %
Date Time Interrogation Session: 20160308171655
Lead Channel Impedance Value: 416 Ohm
Lead Channel Impedance Value: 568 Ohm
Lead Channel Setting Pacing Amplitude: 2.5 V
Lead Channel Setting Pacing Pulse Width: 0.4 ms
Lead Channel Setting Sensing Sensitivity: 0.9 mV
Zone Setting Detection Interval: 400 ms
Zone Setting Detection Interval: 400 ms

## 2014-09-29 NOTE — Progress Notes (Signed)
Remote pacemaker transmission.   

## 2014-10-05 ENCOUNTER — Other Ambulatory Visit: Payer: Self-pay | Admitting: *Deleted

## 2014-10-05 ENCOUNTER — Other Ambulatory Visit: Payer: Self-pay | Admitting: Family Medicine

## 2014-10-05 MED ORDER — SERTRALINE HCL 50 MG PO TABS: 50.0000 mg | ORAL_TABLET | Freq: Every day | ORAL | Status: DC

## 2014-10-05 NOTE — Telephone Encounter (Signed)
Refill request for sertraline Last filled by MD on- 07/27/14 #90 x0 Last Appt: 06/30/2014 Next Appt: 10/15/2014 Please advise refill?

## 2014-10-09 ENCOUNTER — Other Ambulatory Visit: Payer: Self-pay | Admitting: Family Medicine

## 2014-10-09 ENCOUNTER — Encounter: Payer: Self-pay | Admitting: Cardiology

## 2014-10-13 ENCOUNTER — Ambulatory Visit: Payer: Medicare Other | Admitting: Family Medicine

## 2014-10-15 ENCOUNTER — Encounter: Payer: Self-pay | Admitting: Family Medicine

## 2014-10-15 ENCOUNTER — Ambulatory Visit (INDEPENDENT_AMBULATORY_CARE_PROVIDER_SITE_OTHER): Payer: Medicare Other | Admitting: Family Medicine

## 2014-10-15 VITALS — BP 120/84 | HR 78 | Temp 97.6°F | Ht 70.0 in | Wt 225.0 lb

## 2014-10-15 DIAGNOSIS — E785 Hyperlipidemia, unspecified: Secondary | ICD-10-CM | POA: Diagnosis not present

## 2014-10-15 DIAGNOSIS — Z23 Encounter for immunization: Secondary | ICD-10-CM

## 2014-10-15 DIAGNOSIS — I10 Essential (primary) hypertension: Secondary | ICD-10-CM

## 2014-10-15 DIAGNOSIS — E119 Type 2 diabetes mellitus without complications: Secondary | ICD-10-CM

## 2014-10-15 DIAGNOSIS — N182 Chronic kidney disease, stage 2 (mild): Secondary | ICD-10-CM | POA: Diagnosis not present

## 2014-10-15 DIAGNOSIS — Z0000 Encounter for general adult medical examination without abnormal findings: Secondary | ICD-10-CM

## 2014-10-15 LAB — MICROALBUMIN / CREATININE URINE RATIO
Creatinine,U: 243.8 mg/dL
Microalb Creat Ratio: 0.5 mg/g (ref 0.0–30.0)
Microalb, Ur: 1.3 mg/dL (ref 0.0–1.9)

## 2014-10-15 LAB — BASIC METABOLIC PANEL
BUN: 27 mg/dL — ABNORMAL HIGH (ref 6–23)
CO2: 30 mEq/L (ref 19–32)
Calcium: 9.6 mg/dL (ref 8.4–10.5)
Chloride: 103 mEq/L (ref 96–112)
Creatinine, Ser: 1.32 mg/dL (ref 0.40–1.50)
GFR: 56.63 mL/min — ABNORMAL LOW (ref >60.00)
Glucose, Bld: 121 mg/dL — ABNORMAL HIGH (ref 70–99)
Potassium: 5.2 mEq/L — ABNORMAL HIGH (ref 3.5–5.1)
Sodium: 138 mEq/L (ref 135–145)

## 2014-10-15 LAB — HEMOGLOBIN A1C: Hgb A1c MFr Bld: 7.5 % — ABNORMAL HIGH (ref 4.6–6.5)

## 2014-10-15 MED ORDER — METOPROLOL SUCCINATE ER 25 MG PO TB24: 25.0000 mg | ORAL_TABLET | Freq: Every day | ORAL | Status: DC

## 2014-10-15 NOTE — Addendum Note (Signed)
Addended by: Julieta Bellini on: 10/15/2014 01:09 PM   Modules accepted: Orders

## 2014-10-15 NOTE — Progress Notes (Signed)
Pre visit review using our clinic review tool, if applicable. No additional management support is needed unless otherwise documented below in the visit note. 

## 2014-10-15 NOTE — Progress Notes (Signed)
OFFICE NOTE  10/15/2014  CC:  Chief Complaint  Patient presents with  . Follow-up   HPI: Patient is a 74 y.o. Caucasian male who is here for 4 mo f/u DM 2, HTN, hyperlipidemia, and CRI..  Says bp monitoring at home reveals 150s over 90s typically.   Glucose monitoring (1-2 times per week): range 135-150.  Two hour PP "about the same".  EXERCISE: walks frequently, he push-mows yards.   He has a treadmill but doesn't use it.  ROS: no melena, nose bleeds, or excessive skin bruising.  Pertinent PMH:  Past medical, surgical, social, and family history reviewed and no changes are noted since last office visit.  MEDS:  Outpatient Prescriptions Prior to Visit  Medication Sig Dispense Refill  . colchicine 0.6 MG tablet Take 0.6 mg by mouth as needed.   1  . gabapentin (NEURONTIN) 300 MG capsule Take 1 capsule (300 mg total) by mouth 3 (three) times daily. 270 capsule 3  . glipiZIDE (GLUCOTROL XL) 2.5 MG 24 hr tablet Take 1 tablet (2.5 mg total) by mouth daily with breakfast. 90 tablet 3  . HYDROcodone-acetaminophen (NORCO/VICODIN) 5-325 MG per tablet Take 1 tablet by mouth every 6 (six) hours as needed for moderate pain. 30 tablet 0  . losartan (COZAAR) 100 MG tablet TAKE 1 TABLET DAILY 90 tablet 1  . meclizine (ANTIVERT) 25 MG tablet Take 1 tablet (25 mg total) by mouth 3 (three) times daily as needed. For vertigo. 270 tablet 3  . metFORMIN (GLUCOPHAGE) 1000 MG tablet TAKE 1 TABLET TWICE A DAY 180 tablet 1  . NEXIUM 40 MG capsule TAKE 1 CAPSULE TWICE A DAY 180 capsule 1  . rOPINIRole (REQUIP) 1 MG tablet TAKE 1 TABLET DAILY 90 tablet 1  . sertraline (ZOLOFT) 50 MG tablet Take 1 tablet (50 mg total) by mouth daily. 90 tablet 0  . simvastatin (ZOCOR) 20 MG tablet TAKE 1 TABLET EVERY EVENING 90 tablet 1  . sitaGLIPtin (JANUVIA) 100 MG tablet Take 1 tablet (100 mg total) by mouth daily. 90 tablet 3  . XARELTO 20 MG TABS tablet TAKE 1 TABLET DAILY WITH SUPPER 90 tablet 1   No  facility-administered medications prior to visit.    PE: Blood pressure 120/84, pulse 78, temperature 97.6 F (36.4 C), temperature source Oral, height 5\' 10"  (1.778 m), weight 225 lb (102.059 kg), SpO2 95 %. Gen: Alert, well appearing.  Patient is oriented to person, place, time, and situation. CV: RRR, no m/r/g.   LUNGS: CTA bilat, nonlabored resps, good aeration in all lung fields. EXT: no clubbing, cyanosis, or edema.   LAB:  Lab Results  Component Value Date   TSH 2.31 02/05/2014   Lab Results  Component Value Date   WBC 4.4 02/05/2014   HGB 15.0 02/05/2014   HCT 44.9 02/05/2014   MCV 89.8 02/05/2014   PLT 177.0 02/05/2014   Lab Results  Component Value Date   CREATININE 1.2 06/15/2014   BUN 17 06/15/2014   NA 139 06/15/2014   K 4.7 06/15/2014   CL 104 06/15/2014   CO2 27 06/15/2014   Lab Results  Component Value Date   ALT 29 02/05/2014   AST 33 02/05/2014   ALKPHOS 70 02/05/2014   BILITOT 0.6 02/05/2014   Lab Results  Component Value Date   CHOL 133 02/12/2014   Lab Results  Component Value Date   HDL 44.30 02/12/2014   Lab Results  Component Value Date   LDLCALC 71 02/12/2014  Lab Results  Component Value Date   TRIG 87.0 02/12/2014   Lab Results  Component Value Date   CHOLHDL 3 02/12/2014   Lab Results  Component Value Date   HGBA1C 7.1* 06/15/2014    IMPRESSION AND PLAN:  1) DM 2, hx of fair control. Repeat A1c today, do urine microalb/cr today.  2) HTN, not ideal control.  Add toprol XL 25mg  qd today, continue home monitoring.  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  4) Hyperlipidemia; LDL 71 01/2014.  Tolerating statin well.  AST/ALT normal 01/2014. Plan on repeat FLP at next f/u in 4 mo.  5) Preventative health care: prevnar 13 IM today.  An After Visit Summary was printed and given to the patient.   FOLLOW UP: 4 mo

## 2014-10-17 ENCOUNTER — Encounter: Payer: Self-pay | Admitting: Family Medicine

## 2014-10-20 ENCOUNTER — Other Ambulatory Visit: Payer: Self-pay

## 2014-10-20 DIAGNOSIS — E119 Type 2 diabetes mellitus without complications: Secondary | ICD-10-CM

## 2014-10-20 MED ORDER — GLIPIZIDE 5 MG PO TABS: 5.0000 mg | ORAL_TABLET | Freq: Every day | ORAL | Status: DC

## 2014-10-21 ENCOUNTER — Encounter: Payer: Self-pay | Admitting: Cardiovascular Disease

## 2014-10-22 ENCOUNTER — Encounter: Payer: Self-pay | Admitting: Family Medicine

## 2014-10-26 ENCOUNTER — Other Ambulatory Visit: Payer: Self-pay | Admitting: Family Medicine

## 2014-10-26 NOTE — Telephone Encounter (Signed)
RF request for gabapentin.  Last OV was 10/15/14.   Last RF was 12/09/13 x 5 rfs.  Please advise.

## 2014-10-30 ENCOUNTER — Other Ambulatory Visit: Payer: Medicare Other

## 2014-10-30 DIAGNOSIS — M7661 Achilles tendinitis, right leg: Secondary | ICD-10-CM | POA: Diagnosis not present

## 2014-11-02 ENCOUNTER — Other Ambulatory Visit (INDEPENDENT_AMBULATORY_CARE_PROVIDER_SITE_OTHER): Payer: Medicare Other

## 2014-11-02 DIAGNOSIS — E875 Hyperkalemia: Secondary | ICD-10-CM | POA: Diagnosis not present

## 2014-11-02 LAB — BASIC METABOLIC PANEL
BUN: 21 mg/dL (ref 6–23)
CO2: 32 mEq/L (ref 19–32)
Calcium: 9.6 mg/dL (ref 8.4–10.5)
Chloride: 104 mEq/L (ref 96–112)
Creatinine, Ser: 1.26 mg/dL (ref 0.40–1.50)
GFR: 59.75 mL/min — ABNORMAL LOW (ref >60.00)
Glucose, Bld: 102 mg/dL — ABNORMAL HIGH (ref 70–99)
Potassium: 5.2 mEq/L — ABNORMAL HIGH (ref 3.5–5.1)
Sodium: 139 mEq/L (ref 135–145)

## 2014-11-11 ENCOUNTER — Telehealth: Payer: Self-pay | Admitting: Family Medicine

## 2014-11-11 NOTE — Telephone Encounter (Signed)
Patient needs to pick up paper Rx for Januvia 90 day & needles & diabetic testing strips so he can take them to the [facility]. Please call when ready

## 2014-11-12 MED ORDER — PRECISION ULTRA LANCET MISC: Status: DC

## 2014-11-12 MED ORDER — GLUCOSE BLOOD VI STRP: ORAL_STRIP | Status: DC

## 2014-11-12 MED ORDER — SITAGLIPTIN PHOSPHATE 100 MG PO TABS: 100.0000 mg | ORAL_TABLET | Freq: Every day | ORAL | Status: DC

## 2014-11-12 NOTE — Telephone Encounter (Signed)
Rx's printed.  Pt's wife aware.

## 2014-11-20 ENCOUNTER — Ambulatory Visit (INDEPENDENT_AMBULATORY_CARE_PROVIDER_SITE_OTHER): Payer: Medicare Other | Admitting: Family Medicine

## 2014-11-20 ENCOUNTER — Encounter: Payer: Self-pay | Admitting: Family Medicine

## 2014-11-20 VITALS — BP 128/91 | HR 80 | Temp 98.0°F | Resp 18 | Ht 70.0 in | Wt 225.0 lb

## 2014-11-20 DIAGNOSIS — L03012 Cellulitis of left finger: Secondary | ICD-10-CM

## 2014-11-20 MED ORDER — AMOXICILLIN-POT CLAVULANATE 500-125 MG PO TABS: 1.0000 | ORAL_TABLET | Freq: Three times a day (TID) | ORAL | Status: DC

## 2014-11-20 MED ORDER — AMOXICILLIN-POT CLAVULANATE 875-125 MG PO TABS: 1.0000 | ORAL_TABLET | Freq: Two times a day (BID) | ORAL | Status: DC

## 2014-11-20 NOTE — Assessment & Plan Note (Signed)
No sign of pulp space infection/felon at this time. Will start augmentin 500 mg tid x 10d. Recheck in 3 days. Signs/symptoms to call or return for were reviewed and pt expressed understanding.

## 2014-11-20 NOTE — Progress Notes (Signed)
OFFICE NOTE  11/20/2014  CC:  Chief Complaint  Patient presents with  . Hand Injury   HPI: Patient is a 74 y.o. Caucasian male who is here for pain in distal aspect of left 5th finger. No injury recalled.  Onset about 2 wks ago, getting more painful, some reddish areas on volar surface.  Recalls no cut there at any time.  No fevers, no malaise or fatigue or nausea.  Not taking med for pain b/c the intensity of the pain is not that bad.  ROS: slight sensory difference in the area of concern that is constant lately.    Pertinent PMH:  Past medical, surgical, social, and family history reviewed and no changes are noted since last office visit.  MEDS:  Outpatient Prescriptions Prior to Visit  Medication Sig Dispense Refill  . colchicine 0.6 MG tablet Take 0.6 mg by mouth as needed.   1  . gabapentin (NEURONTIN) 300 MG capsule TAKE 1 CAPSULE THREE TIMES A DAY 270 capsule 3  . glipiZIDE (GLUCOTROL XL) 5 MG 24 hr tablet Take 5 mg by mouth daily with breakfast.    . HYDROcodone-acetaminophen (NORCO/VICODIN) 5-325 MG per tablet Take 1 tablet by mouth every 6 (six) hours as needed for moderate pain. 30 tablet 0  . losartan (COZAAR) 100 MG tablet TAKE 1 TABLET DAILY 90 tablet 1  . meclizine (ANTIVERT) 25 MG tablet Take 1 tablet (25 mg total) by mouth 3 (three) times daily as needed. For vertigo. 270 tablet 3  . metFORMIN (GLUCOPHAGE) 1000 MG tablet TAKE 1 TABLET TWICE A DAY 180 tablet 1  . metoprolol succinate (TOPROL-XL) 25 MG 24 hr tablet Take 1 tablet (25 mg total) by mouth daily. 30 tablet 6  . NEXIUM 40 MG capsule TAKE 1 CAPSULE TWICE A DAY 180 capsule 1  . rOPINIRole (REQUIP) 1 MG tablet TAKE 1 TABLET DAILY 90 tablet 1  . sertraline (ZOLOFT) 50 MG tablet Take 1 tablet (50 mg total) by mouth daily. 90 tablet 0  . simvastatin (ZOCOR) 20 MG tablet TAKE 1 TABLET EVERY EVENING 90 tablet 1  . sitaGLIPtin (JANUVIA) 100 MG tablet Take 1 tablet (100 mg total) by mouth daily. 90 tablet 3  . XARELTO  20 MG TABS tablet TAKE 1 TABLET DAILY WITH SUPPER 90 tablet 1  . glucose blood test strip Use as instructed 100 each 3  . Lancets (PRECISION ULTRA LANCET) MISC Use as instructed 100 each 3   No facility-administered medications prior to visit.    PE: Blood pressure 128/91, pulse 80, temperature 98 F (36.7 C), temperature source Temporal, resp. rate 18, height 5\' 10"  (1.778 m), weight 225 lb (102.059 kg), SpO2 100 %. Gen: Alert, well appearing.  Patient is oriented to person, place, time, and situation. Left hand little finger distal phalanx, volar aspect with mild erythema and tenderness, most tender near the tip. However, there is no fluctuance or distention of the skin in the area of concern, no induration.   IMPRESSION AND PLAN: Cellulitis of finger of left hand No sign of pulp space infection/felon at this time. Will start augmentin 500 mg tid x 10d. Recheck in 3 days. Signs/symptoms to call or return for were reviewed and pt expressed understanding.     An After Visit Summary was printed and given to the patient.  FOLLOW UP: 3d

## 2014-11-20 NOTE — Progress Notes (Signed)
Pre visit review using our clinic review tool, if applicable. No additional management support is needed unless otherwise documented below in the visit note. 

## 2014-11-26 ENCOUNTER — Ambulatory Visit (INDEPENDENT_AMBULATORY_CARE_PROVIDER_SITE_OTHER): Payer: Medicare Other | Admitting: Family Medicine

## 2014-11-26 ENCOUNTER — Encounter: Payer: Self-pay | Admitting: Family Medicine

## 2014-11-26 VITALS — BP 121/83 | HR 78 | Temp 97.2°F | Resp 16 | Wt 227.0 lb

## 2014-11-26 DIAGNOSIS — L03012 Cellulitis of left finger: Secondary | ICD-10-CM | POA: Diagnosis not present

## 2014-11-26 NOTE — Progress Notes (Signed)
OFFICE NOTE  11/26/2014  CC:  Chief Complaint  Patient presents with  . Follow-up    cellulitis finger     HPI: Patient is a 73 y.o. Caucasian male who is here for 1 wk f/u L fifth finger suspected cellulitis. Feels about 50% improved in finger: less painful, less redness.  No malaise or fevers. No new complaints.  Tolerating augmentin fine.   Pertinent PMH:  Past medical, surgical, social, and family history reviewed and no changes are noted since last office visit.  MEDS:  Outpatient Prescriptions Prior to Visit  Medication Sig Dispense Refill  . amoxicillin-clavulanate (AUGMENTIN) 500-125 MG per tablet Take 1 tablet (500 mg total) by mouth 3 (three) times daily. 30 tablet 0  . colchicine 0.6 MG tablet Take 0.6 mg by mouth as needed.   1  . gabapentin (NEURONTIN) 300 MG capsule TAKE 1 CAPSULE THREE TIMES A DAY 270 capsule 3  . glipiZIDE (GLUCOTROL XL) 5 MG 24 hr tablet Take 5 mg by mouth daily with breakfast.    . glucose blood test strip Use as instructed 100 each 3  . HYDROcodone-acetaminophen (NORCO/VICODIN) 5-325 MG per tablet Take 1 tablet by mouth every 6 (six) hours as needed for moderate pain. 30 tablet 0  . Lancets (PRECISION ULTRA LANCET) MISC Use as instructed 100 each 3  . losartan (COZAAR) 100 MG tablet TAKE 1 TABLET DAILY 90 tablet 1  . meclizine (ANTIVERT) 25 MG tablet Take 1 tablet (25 mg total) by mouth 3 (three) times daily as needed. For vertigo. 270 tablet 3  . metFORMIN (GLUCOPHAGE) 1000 MG tablet TAKE 1 TABLET TWICE A DAY 180 tablet 1  . metoprolol succinate (TOPROL-XL) 25 MG 24 hr tablet Take 1 tablet (25 mg total) by mouth daily. 30 tablet 6  . NEXIUM 40 MG capsule TAKE 1 CAPSULE TWICE A DAY 180 capsule 1  . rOPINIRole (REQUIP) 1 MG tablet TAKE 1 TABLET DAILY 90 tablet 1  . sertraline (ZOLOFT) 50 MG tablet Take 1 tablet (50 mg total) by mouth daily. 90 tablet 0  . simvastatin (ZOCOR) 20 MG tablet TAKE 1 TABLET EVERY EVENING 90 tablet 1  . sitaGLIPtin  (JANUVIA) 100 MG tablet Take 1 tablet (100 mg total) by mouth daily. 90 tablet 3  . XARELTO 20 MG TABS tablet TAKE 1 TABLET DAILY WITH SUPPER 90 tablet 1   No facility-administered medications prior to visit.    PE: Blood pressure 121/83, pulse 78, temperature 97.2 F (36.2 C), temperature source Temporal, resp. rate 16, weight 227 lb (102.967 kg), SpO2 98 %. Gen: Alert, well appearing.  Patient is oriented to person, place, time, and situation. Left hand 5th finger with mild TTP on volar surface of distal phalanx, excluding the actual tip of the finger.  No nail abnormality or tenderness of the nail bed or dorsal aspect of the finger.  No swelling. There is a hint of splotchy, blanching erythema in the area of tenderness but no induration, fluctuance, or nodularity.  No pustule or papule or vesicle.  ROM of 5th digit fully intact w/out pain.  IMPRESSION AND PLAN:  Improving L 5th finger distal phalanx cellulitis. No sign of felon. Finish 10 day course of augmentin as rx'd.   If sx's don't fully resolve after abx OR if the area worsens when he finishes his abx then he is to call or return.  An After Visit Summary was printed and given to the patient.  FOLLOW UP: prn

## 2014-11-26 NOTE — Progress Notes (Signed)
Pre visit review using our clinic review tool, if applicable. No additional management support is needed unless otherwise documented below in the visit note. 

## 2014-12-14 ENCOUNTER — Other Ambulatory Visit: Payer: Self-pay | Admitting: Cardiovascular Disease

## 2014-12-29 ENCOUNTER — Ambulatory Visit (INDEPENDENT_AMBULATORY_CARE_PROVIDER_SITE_OTHER): Payer: Medicare Other | Admitting: *Deleted

## 2014-12-29 DIAGNOSIS — I495 Sick sinus syndrome: Secondary | ICD-10-CM

## 2014-12-29 NOTE — Progress Notes (Signed)
Remote pacemaker transmission.   

## 2015-01-01 ENCOUNTER — Telehealth: Payer: Self-pay | Admitting: Family Medicine

## 2015-01-01 DIAGNOSIS — M4716 Other spondylosis with myelopathy, lumbar region: Secondary | ICD-10-CM

## 2015-01-01 DIAGNOSIS — Z9889 Other specified postprocedural states: Secondary | ICD-10-CM

## 2015-01-01 DIAGNOSIS — M545 Low back pain, unspecified: Secondary | ICD-10-CM

## 2015-01-01 LAB — CUP PACEART REMOTE DEVICE CHECK
Battery Voltage: 2.97 V
Brady Statistic RV Percent Paced: 99.95 %
Date Time Interrogation Session: 20160607155113
Lead Channel Impedance Value: 416 Ohm
Lead Channel Impedance Value: 568 Ohm
Lead Channel Setting Pacing Amplitude: 2.5 V
Lead Channel Setting Pacing Pulse Width: 0.4 ms
Lead Channel Setting Sensing Sensitivity: 0.9 mV
Zone Setting Detection Interval: 400 ms
Zone Setting Detection Interval: 400 ms

## 2015-01-01 NOTE — Telephone Encounter (Signed)
OK. Referral ordered per pt request.

## 2015-01-01 NOTE — Telephone Encounter (Signed)
Mason [name] called and is having back issues again. He needs a new referral to go back to Dr. Clarice Pole. He has a f/u appt with you in July.  Please call cell [phone] to notify him if you will do the referral.

## 2015-01-03 ENCOUNTER — Other Ambulatory Visit: Payer: Self-pay | Admitting: Family Medicine

## 2015-01-04 NOTE — Telephone Encounter (Signed)
LOV: 11/26/14, up coming ov 02/15/15, last written: 10/05/14 w/ 0RF, #90

## 2015-01-06 DIAGNOSIS — Z86010 Personal history of colonic polyps: Secondary | ICD-10-CM | POA: Diagnosis not present

## 2015-01-06 DIAGNOSIS — I482 Chronic atrial fibrillation: Secondary | ICD-10-CM | POA: Diagnosis not present

## 2015-01-06 DIAGNOSIS — E119 Type 2 diabetes mellitus without complications: Secondary | ICD-10-CM | POA: Diagnosis not present

## 2015-01-11 ENCOUNTER — Encounter: Payer: Self-pay | Admitting: Cardiovascular Disease

## 2015-01-11 ENCOUNTER — Telehealth: Payer: Self-pay | Admitting: *Deleted

## 2015-01-11 NOTE — Telephone Encounter (Signed)
Requesting surgical clearance:   1. Type of surgery: Colonscopy  2. Surgeon: Carol Ada, MD  3. Surgical date: 02/04/2015  4. Medications that need to be held: Can  Xarelto be held x 5 days prior to procedure?                Special instructions:

## 2015-01-11 NOTE — Telephone Encounter (Signed)
Letter for surgical clearance sent to Dr. [name].

## 2015-01-11 NOTE — Telephone Encounter (Signed)
In epic

## 2015-01-14 ENCOUNTER — Encounter: Payer: Self-pay | Admitting: Cardiology

## 2015-01-14 DIAGNOSIS — M5416 Radiculopathy, lumbar region: Secondary | ICD-10-CM | POA: Diagnosis not present

## 2015-01-14 DIAGNOSIS — R202 Paresthesia of skin: Secondary | ICD-10-CM | POA: Diagnosis not present

## 2015-01-15 ENCOUNTER — Encounter: Payer: Self-pay | Admitting: Family Medicine

## 2015-01-16 ENCOUNTER — Encounter: Payer: Self-pay | Admitting: Family Medicine

## 2015-01-17 ENCOUNTER — Encounter (HOSPITAL_BASED_OUTPATIENT_CLINIC_OR_DEPARTMENT_OTHER): Payer: Self-pay | Admitting: Emergency Medicine

## 2015-01-17 ENCOUNTER — Emergency Department (HOSPITAL_BASED_OUTPATIENT_CLINIC_OR_DEPARTMENT_OTHER)
Admission: EM | Admit: 2015-01-17 | Discharge: 2015-01-17 | Disposition: A | Discharge status: Home / Self Care | Payer: Medicare Other | Attending: Emergency Medicine | Admitting: Emergency Medicine

## 2015-01-17 DIAGNOSIS — Y288XXA Contact with other sharp object, undetermined intent, initial encounter: Secondary | ICD-10-CM | POA: Insufficient documentation

## 2015-01-17 DIAGNOSIS — Y9289 Other specified places as the place of occurrence of the external cause: Secondary | ICD-10-CM | POA: Diagnosis not present

## 2015-01-17 DIAGNOSIS — S61214A Laceration without foreign body of right ring finger without damage to nail, initial encounter: Secondary | ICD-10-CM | POA: Insufficient documentation

## 2015-01-17 DIAGNOSIS — N183 Chronic kidney disease, stage 3 (moderate): Secondary | ICD-10-CM | POA: Diagnosis not present

## 2015-01-17 DIAGNOSIS — Z87442 Personal history of urinary calculi: Secondary | ICD-10-CM | POA: Insufficient documentation

## 2015-01-17 DIAGNOSIS — M1990 Unspecified osteoarthritis, unspecified site: Secondary | ICD-10-CM | POA: Insufficient documentation

## 2015-01-17 DIAGNOSIS — IMO0002 Reserved for concepts with insufficient information to code with codable children: Secondary | ICD-10-CM

## 2015-01-17 DIAGNOSIS — E119 Type 2 diabetes mellitus without complications: Secondary | ICD-10-CM | POA: Diagnosis not present

## 2015-01-17 DIAGNOSIS — I129 Hypertensive chronic kidney disease with stage 1 through stage 4 chronic kidney disease, or unspecified chronic kidney disease: Secondary | ICD-10-CM | POA: Diagnosis not present

## 2015-01-17 DIAGNOSIS — F329 Major depressive disorder, single episode, unspecified: Secondary | ICD-10-CM | POA: Diagnosis not present

## 2015-01-17 DIAGNOSIS — G8929 Other chronic pain: Secondary | ICD-10-CM | POA: Insufficient documentation

## 2015-01-17 DIAGNOSIS — Y93E5 Activity, floor mopping and cleaning: Secondary | ICD-10-CM | POA: Insufficient documentation

## 2015-01-17 DIAGNOSIS — Z792 Long term (current) use of antibiotics: Secondary | ICD-10-CM | POA: Diagnosis not present

## 2015-01-17 DIAGNOSIS — I482 Chronic atrial fibrillation: Secondary | ICD-10-CM | POA: Diagnosis not present

## 2015-01-17 DIAGNOSIS — K219 Gastro-esophageal reflux disease without esophagitis: Secondary | ICD-10-CM | POA: Insufficient documentation

## 2015-01-17 DIAGNOSIS — Z86010 Personal history of colonic polyps: Secondary | ICD-10-CM | POA: Insufficient documentation

## 2015-01-17 DIAGNOSIS — Z7901 Long term (current) use of anticoagulants: Secondary | ICD-10-CM | POA: Insufficient documentation

## 2015-01-17 DIAGNOSIS — Y998 Other external cause status: Secondary | ICD-10-CM | POA: Insufficient documentation

## 2015-01-17 DIAGNOSIS — Z950 Presence of cardiac pacemaker: Secondary | ICD-10-CM | POA: Insufficient documentation

## 2015-01-17 DIAGNOSIS — Z79899 Other long term (current) drug therapy: Secondary | ICD-10-CM | POA: Insufficient documentation

## 2015-01-17 DIAGNOSIS — Z8669 Personal history of other diseases of the nervous system and sense organs: Secondary | ICD-10-CM | POA: Diagnosis not present

## 2015-01-17 MED ORDER — CEPHALEXIN 500 MG PO CAPS: 500.0000 mg | ORAL_CAPSULE | Freq: Four times a day (QID) | ORAL | Status: DC

## 2015-01-17 MED ORDER — CEPHALEXIN 250 MG PO CAPS
500.0000 mg | ORAL_CAPSULE | Freq: Once | ORAL | Status: AC
  Administered 2015-01-17: 500 mg via ORAL
  Filled 2015-01-17: qty 2

## 2015-01-17 MED ORDER — BACITRACIN 500 UNIT/GM EX OINT
1.0000 "application " | TOPICAL_OINTMENT | Freq: Two times a day (BID) | CUTANEOUS | Status: DC
  Filled 2015-01-17: qty 0.9

## 2015-01-17 MED ORDER — LIDOCAINE HCL (PF) 1 % IJ SOLN
INTRAMUSCULAR | Status: AC
  Administered 2015-01-17: 16:00:00
  Filled 2015-01-17: qty 5

## 2015-01-17 MED ORDER — LIDOCAINE HCL (PF) 1 % IJ SOLN: 10.0000 mL | Freq: Once | INTRAMUSCULAR | Status: AC

## 2015-01-17 NOTE — ED Notes (Signed)
Pt in with lac to R hand from a screw, located between 4th and 5th fingers, bleeding controlled.

## 2015-01-17 NOTE — ED Provider Notes (Signed)
CSN: [id number]     Arrival date & time 01/17/15  1323 History   First MD Initiated Contact with Patient 01/17/15 1351     Chief Complaint  Patient presents with  . Extremity Laceration     (Consider location/radiation/quality/duration/timing/severity/associated sxs/prior Treatment) HPI   Blood pressure 136/90, pulse 85, temperature 97.7 F (36.5 C), temperature source Oral, resp. rate 18, height 5\' 11"  (1.803 m), weight 223 lb (101.152 kg), SpO2 97 %.  Mason [name] is a 74 y.o. male complaining of w/ h/o type 2 diabetes, who is anticoagulated with xarelto for a pacemaker, who presents to the ED today w/ a 1 in. laceration to the proximal ulnar aspect of his right ring finger.  He was cleaning out his shed when he cut his hand on the head of a screw around noon today, bleeding was controlled with pressure to the wound.  His last tetanus vaccine was 3 years ago.    Past Medical History  Diagnosis Date  . Chronic low back pain   . Hypertension   . Diabetes mellitus   . Depression   . Shortness of breath   . 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 GERD (gastroesophageal reflux disease)   . Arthritis   . Permanent atrial fibrillation     DCCV 07/09/13-converted, lasted two days, then back into afib--needs lifetime anticoagulation (Xarelto as of 09/2014)  . Left ventricular dysfunction 05/31/12    EF 40-45%, LA mod-severe dilated, AFIB  . History of cardiovascular stress test 05/28/12    no ischemia, EF 37%, imaging results are unchanged and within normal variance  . H/O tilt table evaluation 11/02/05    negative  . BPH (benign prostatic hypertrophy) 06/2011    Irritative sx's; pt declined trial of anticholinergic per Urology records  . History of vertigo     + Hx of posterior HA's.  Neuro (Dr. Erling Cruz) eval 2011.  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   [name] History of retinal detachment     OS  . History of chronic prostatitis   . Lumbar spondylosis     lumbosacral radiculopathy at L4 by EMG testing, right foot drop (neurologist is Dr. Linus Salmons with [facility] in [place])--neurologist referred him to neurosurgery  . Nephrolithiasis 07/2012    Left UVJ 2 mm stone with dilation of renal collecting system and slight hydroureter on right  . Episodic low back pain 01/22/2013    w/intermittent radiculitis (12/2014 his neurologist referred him to pain mgmt for epidural steroid injection)  . Chronic renal insufficiency, stage III (moderate) 2015    CrCl about 60 ml/min  . History of adenomatous polyp of colon 10/12/11    Dr. [name] (3 right side of colon- tubular adenomas removed)   Past Surgical History  Procedure Laterality Date  . Eye surgery      retinal detachment OS+ cataract surgery  . Lumbar laminectomy  1976    left L4-5  . Insert / replace / remove pacemaker  02/05/2012    dual chamber, sinus node dysfunction, sinus arrest, PAF, Medtronic Revo serial#-[id number]  . Cardioversion  07/09/2012    Procedure: CARDIOVERSION;  Surgeon: [name], MD;  Location: [facility] ENDOSCOPY;  Service: Cardiovascular;  Laterality: N/A;  . Esophagogastroduodenoscopy  10/18/06    Done due to chronic GERD: Normal, bx showed no barrett's esophagus (Dr. [name])  . Colonoscopy w/ polypectomy  approx 2006;  repeated 09/2011    Polyps on 2013 EGD as well, repeat 12/2014  . Transthoracic echocardiogram  08/25/10; 05/2012    mild asymmetric LVH, normal systolic function, normal diastolic fxn, mild-to-mod mitral regurg, mild aortic valve sclerosis and trace AI, mild aortic root dilatation. 2014 f/u showed EF 40-45%, mod LAE, A FIB  . Cardiovascular stress test  2012    nuclear perfusion study: low risk scan  . Permanent pacemaker insertion N/A 02/05/2012    Procedure: PERMANENT PACEMAKER INSERTION;  Surgeon: [name], MD;  Location: [place] CATH LAB;  Service:  Cardiovascular;  Laterality: N/A;   Family History  Problem Relation Age of Onset  . Heart failure Mother   . Stroke Mother   . Stroke Father    History  Substance Use Topics  . Smoking status: Never Smoker   . Smokeless tobacco: Never Used  . Alcohol Use: 7.2 oz/week    12 Cans of beer per week     Comment: 12 beers per week    Review of Systems  10 systems reviewed and found to be negative, except as noted in the HPI.  Allergies  Review of patient's allergies indicates no known allergies.  Home Medications   Prior to Admission medications   Medication Sig Start Date End Date Taking? Authorizing Provider  amoxicillin-clavulanate (AUGMENTIN) 500-125 MG per tablet Take 1 tablet (500 mg total) by mouth 3 (three) times daily. 11/20/14   Tammi Sou, MD  colchicine 0.6 MG tablet Take 0.6 mg by mouth as needed.  05/20/14   Historical Provider, MD  gabapentin (NEURONTIN) 300 MG capsule TAKE 1 CAPSULE THREE TIMES A DAY 10/26/14   Tammi Sou, MD  glipiZIDE (GLUCOTROL XL) 5 MG 24 hr tablet Take 5 mg by mouth daily with breakfast.    Historical Provider, MD  glucose blood test strip Use as instructed 11/12/14   Tammi Sou, MD  HYDROcodone-acetaminophen (NORCO/VICODIN) 5-325 MG per tablet Take 1 tablet by mouth every 6 (six) hours as needed for moderate pain. 05/15/14   Irene Pap, NP  Lancets (PRECISION ULTRA LANCET) MISC Use as instructed 11/12/14   Tammi Sou, MD  losartan (COZAAR) 100 MG tablet TAKE 1 TABLET DAILY 10/05/14   Tammi Sou, MD  meclizine (ANTIVERT) 25 MG tablet Take 1 tablet (25 mg total) by mouth 3 (three) times daily as needed. For vertigo. 02/12/14   Tammi Sou, MD  metFORMIN (GLUCOPHAGE) 1000 MG tablet TAKE 1 TABLET TWICE A DAY 10/05/14   Tammi Sou, MD  metoprolol succinate (TOPROL-XL) 25 MG 24 hr tablet Take 1 tablet (25 mg total) by mouth daily. 10/15/14   Tammi Sou, MD  NEXIUM 40 MG capsule TAKE 1 CAPSULE TWICE A DAY  10/09/14   Tammi Sou, MD  rivaroxaban (XARELTO) 20 MG TABS tablet Take 1 tablet (20 mg total) by mouth daily with supper. 12/16/14   Mihai Croitoru, MD  rOPINIRole (REQUIP) 1 MG tablet TAKE 1 TABLET DAILY 09/14/14   Tammi Sou, MD  sertraline (ZOLOFT) 50 MG tablet TAKE 1 TABLET DAILY 01/04/15   Tammi Sou, MD  simvastatin (ZOCOR) 20 MG tablet TAKE 1 TABLET EVERY EVENING 10/05/14   Tammi Sou, MD  sitaGLIPtin (JANUVIA) 100 MG tablet Take 1 tablet (100 mg total) by mouth daily. 11/12/14   Tammi Sou, MD   BP 136/90 mmHg  Pulse 85  Temp(Src) 97.7 F (36.5 C) (Oral)  Resp 18  Ht 5\' 11"  (  1.803 m)  Wt 223 lb (101.152 kg)  BMI 31.12 kg/m2  SpO2 97% Physical Exam  Musculoskeletal:       Hands: Skin:  The laceration is approximately 1 in, it curves around the proximal segment of the ring finger from the ulnar to the palmar aspect with and splits at the end into 2 1/4in segments.  Bleeding is well controlled and subcutaneous adipose tissue is visualized, no tendon, muscle, or bone is seen.  Sensation is intact throughout the digit, as well as movement, and strength.  There is brisk capillary refill in the finger.  It appears there is no compromise to the nerves, muscles, tendons, or vascular supply to the finger.      ED Course  LACERATION REPAIR Date/Time: 01/17/2015 9:08 PM Performed by: Monico Blitz Authorized by: Monico Blitz Consent: Verbal consent obtained. Consent given by: patient Patient identity confirmed: verbally with patient Body area: upper extremity Location details: right ring finger Laceration length: 2.5 cm Foreign bodies: no foreign bodies Tendon involvement: none Nerve involvement: none Vascular damage: no Anesthesia: digital block Local anesthetic: lidocaine 1% without epinephrine Anesthetic total: 5 ml Patient sedated: no Irrigation solution: saline Irrigation method: syringe Amount of cleaning: extensive Debridement:  moderate Degree of undermining: none Skin closure: Ethilon (4-0) Number of sutures: 9 Technique: simple Approximation: close Approximation difficulty: complex   (including critical care time) Labs Review Labs Reviewed - No data to display  Imaging Review No results found.   EKG Interpretation None      MDM   Final diagnoses:  None    Filed Vitals:   01/17/15 1333 01/17/15 1615 01/17/15 1624  BP: 136/90 137/89   Pulse: 85  72  Temp: 97.7 F (36.5 C)    TempSrc: Oral    Resp: 18    Height: 5\' 11"  (1.803 m)    Weight: 223 lb (101.152 kg)    SpO2: 97%  99%    Medications  lidocaine (PF) (XYLOCAINE) 1 % injection 10 mL (0 mLs Infiltration Duplicate 0/35/46 5681)  lidocaine (PF) (XYLOCAINE) 1 % injection (  Given by Other 01/17/15 1600)  cephALEXin (KEFLEX) capsule 500 mg (500 mg Oral Given 01/17/15 1620)    Mason [name] is a pleasant 74 y.o. male presenting with finger laceration.  No signs of tendon/joint involvement.. Pressure irrigation performed. Laceration occurred < 8 hours prior to repair which was well tolerated. Pt has no co morbidities to effect normal wound healing. Discussed suture home care w pt and answered questions. Pt to f-u for wound check and suture removal in 7 days. Pt is hemodynamically stable with no complaints prior to dc.  As the patient is a diabetic he is at greater risk for infection and will be given keflex prophylactically.   Evaluation does not show pathology that would require ongoing emergent intervention or inpatient treatment. Pt is hemodynamically stable and mentating appropriately. Discussed findings and plan with patient/guardian, who agrees with care plan. All questions answered. Return precautions discussed and outpatient follow up given.   Discharge Medication List as of 01/17/2015  4:06 PM    START taking these medications   Details  cephALEXin (KEFLEX) 500 MG capsule Take 1 capsule (500 mg total) by mouth 4 (four) times daily.,  Starting 01/17/2015, Until Discontinued, Print             Monico Blitz, PA-C 01/17/15 2109  Leonard Schwartz, MD 01/19/15 [phone]

## 2015-01-17 NOTE — Discharge Instructions (Signed)
Keep wound dry and do not remove dressing for 24 hours if possible. After that, wash gently morning and night (every 12 hours) with soap and water. Use a topical antibiotic ointment and cover with a bandaid or gauze.  °  °Do NOT use rubbing alcohol or hydrogen peroxide, do not soak the area °  °Present to your primary care doctor or the urgent care of your choice, or the ED for suture removal in 7-10 days. °  °Every attempt was made to remove foreign body (contaminants) from the wound.  However, there is always a chance that some may remain in the wound. This can  increase your risk of infection. °  °If you see signs of infection (warmth, redness, tenderness, pus, sharp increase in pain, fever, red streaking in the skin) immediately return to the emergency department. °  °After the wound heals fully, apply sunscreen for 6-12 months to minimize scarring.  ° °

## 2015-01-20 ENCOUNTER — Encounter: Payer: Self-pay | Admitting: Cardiovascular Disease

## 2015-01-27 ENCOUNTER — Telehealth: Payer: Self-pay | Admitting: Family Medicine

## 2015-01-27 NOTE — Telephone Encounter (Signed)
Patient's wife called back. She found the Rx.

## 2015-01-27 NOTE — Telephone Encounter (Signed)
Opened in error

## 2015-01-27 NOTE — Telephone Encounter (Signed)
Patient got an Rx for an antibiotic from a emergency room visit. He cut his finger. Patient lost the Rx & is asking if Dr. Anitra Lauth can send a replacement in to [facility] in [place].

## 2015-02-09 ENCOUNTER — Encounter: Payer: Self-pay | Admitting: Family Medicine

## 2015-02-09 ENCOUNTER — Ambulatory Visit (HOSPITAL_BASED_OUTPATIENT_CLINIC_OR_DEPARTMENT_OTHER)
Admission: RE | Admit: 2015-02-09 | Discharge: 2015-02-09 | Disposition: A | Discharge status: Home / Self Care | Payer: Medicare Other | Source: Ambulatory Visit | Attending: Family Medicine | Admitting: Family Medicine

## 2015-02-09 ENCOUNTER — Ambulatory Visit (INDEPENDENT_AMBULATORY_CARE_PROVIDER_SITE_OTHER): Payer: Medicare Other | Admitting: Family Medicine

## 2015-02-09 VITALS — BP 140/92 | HR 77 | Temp 98.1°F | Resp 16 | Ht 70.0 in | Wt 231.0 lb

## 2015-02-09 DIAGNOSIS — Z7901 Long term (current) use of anticoagulants: Secondary | ICD-10-CM | POA: Diagnosis not present

## 2015-02-09 DIAGNOSIS — R1903 Right lower quadrant abdominal swelling, mass and lump: Secondary | ICD-10-CM

## 2015-02-09 DIAGNOSIS — Z0389 Encounter for observation for other suspected diseases and conditions ruled out: Secondary | ICD-10-CM | POA: Diagnosis not present

## 2015-02-09 DIAGNOSIS — R935 Abnormal findings on diagnostic imaging of other abdominal regions, including retroperitoneum: Secondary | ICD-10-CM | POA: Diagnosis not present

## 2015-02-09 DIAGNOSIS — R1031 Right lower quadrant pain: Secondary | ICD-10-CM

## 2015-02-09 DIAGNOSIS — N183 Chronic kidney disease, stage 3 unspecified: Secondary | ICD-10-CM

## 2015-02-09 DIAGNOSIS — M5416 Radiculopathy, lumbar region: Secondary | ICD-10-CM | POA: Diagnosis not present

## 2015-02-09 DIAGNOSIS — M5136 Other intervertebral disc degeneration, lumbar region: Secondary | ICD-10-CM | POA: Diagnosis not present

## 2015-02-09 LAB — BASIC METABOLIC PANEL
BUN: 24 mg/dL — ABNORMAL HIGH (ref 6–23)
CO2: 27 mEq/L (ref 19–32)
Calcium: 9.5 mg/dL (ref 8.4–10.5)
Chloride: 103 mEq/L (ref 96–112)
Creatinine, Ser: 1.17 mg/dL (ref 0.40–1.50)
GFR: 65.03 mL/min (ref >60.00)
Glucose, Bld: 180 mg/dL — ABNORMAL HIGH (ref 70–99)
Potassium: 5.3 mEq/L — ABNORMAL HIGH (ref 3.5–5.1)
Sodium: 138 mEq/L (ref 135–145)

## 2015-02-09 LAB — CBC WITH DIFFERENTIAL/PLATELET
Basophils Absolute: 0 10*3/uL (ref 0.0–0.1)
Basophils Relative: 0.3 % (ref 0.0–3.0)
Eosinophils Absolute: 0.1 10*3/uL (ref 0.0–0.7)
Eosinophils Relative: 0.9 % (ref 0.0–5.0)
HCT: 43.2 % (ref 39.0–52.0)
Hemoglobin: 14.6 g/dL (ref 13.0–17.0)
Lymphocytes Relative: 16.5 % (ref 12.0–46.0)
Lymphs Abs: 1 10*3/uL (ref 0.7–4.0)
MCHC: 33.7 g/dL (ref 30.0–36.0)
MCV: 89.4 fl (ref 78.0–100.0)
Monocytes Absolute: 0.1 10*3/uL (ref 0.1–1.0)
Monocytes Relative: 2.5 % — ABNORMAL LOW (ref 3.0–12.0)
Neutro Abs: 4.7 10*3/uL (ref 1.4–7.7)
Neutrophils Relative %: 79.8 % — ABNORMAL HIGH (ref 43.0–77.0)
Platelets: 170 10*3/uL (ref 150.0–400.0)
RBC: 4.83 Mil/uL (ref 4.22–5.81)
RDW: 13.8 % (ref 11.5–15.5)
WBC: 5.9 10*3/uL (ref 4.0–10.5)

## 2015-02-09 MED ORDER — METOPROLOL SUCCINATE ER 25 MG PO TB24: 25.0000 mg | ORAL_TABLET | Freq: Every day | ORAL | Status: DC

## 2015-02-09 NOTE — Progress Notes (Signed)
Pre visit review using our clinic review tool, if applicable. No additional management support is needed unless otherwise documented below in the visit note. 

## 2015-02-09 NOTE — Progress Notes (Signed)
OFFICE VISIT  02/09/2015   CC:  Chief Complaint  Patient presents with  . Abdominal Pain    RLQ x 1 week   HPI:    Patient is a 74 y.o. Caucasian male who presents for about 1 week of pain in R lower abdominal region. Says it started abruptly, comes and goes, worse with certain body movements like bending over.  Eating has no effect on it.  No nausea.  BM's occurring regularly lately, no blood or other problem.  No urinary straining, no incomplete emptying.  The pain stays focal, no radiation.   He says he can feel something palpable in the area of concern. Of note, he has not been taking his xarelto for the last 5d b/c he will be getting a colonoscopy in 3d.  Past Medical History  Diagnosis Date  . Chronic low back pain   . Hypertension   . Diabetes mellitus   . Depression   . Shortness of breath   . 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 GERD (gastroesophageal reflux disease)   . Arthritis   . Permanent atrial fibrillation     DCCV 07/09/13-converted, lasted two days, then back into afib--needs lifetime anticoagulation (Xarelto as of 09/2014)  . Left ventricular dysfunction 05/31/12    EF 40-45%, LA mod-severe dilated, AFIB  . History of cardiovascular stress test 05/28/12    no ischemia, EF 37%, imaging results are unchanged and within normal variance  . H/O tilt table evaluation 11/02/05    negative  . BPH (benign prostatic hypertrophy) 06/2011    Irritative sx's; pt declined trial of anticholinergic per Urology records  . History of vertigo     + Hx of posterior HA's.  Neuro (Dr. Erling Cruz) eval 2011.  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  [name] History of retinal detachment     OS  . History of chronic prostatitis   . Lumbar spondylosis     lumbosacral radiculopathy at L4 by EMG testing, right foot drop (neurologist is Dr. Linus Salmons with [facility] in [place])--neurologist referred him to neurosurgery  . Nephrolithiasis 07/2012    Left UVJ 2 mm stone with dilation of renal collecting system and slight hydroureter on right  . Episodic low back pain 01/22/2013    w/intermittent radiculitis (12/2014 his neurologist referred him to pain mgmt for epidural steroid injection)  . Chronic renal insufficiency, stage III (moderate) 2015    CrCl about 60 ml/min  . History of adenomatous polyp of colon 10/12/11    Dr. [name] (3 right side of colon- tubular adenomas removed)    Past Surgical History  Procedure Laterality Date  . Eye surgery      retinal detachment OS+ cataract surgery  . Lumbar laminectomy  1976    left L4-5  . Insert / replace / remove pacemaker  02/05/2012    dual chamber, sinus node dysfunction, sinus arrest, PAF, Medtronic Revo serial#-[id number]  . Cardioversion  07/09/2012    Procedure: CARDIOVERSION;  Surgeon: [name], MD;  Location: [facility] ENDOSCOPY;  Service: Cardiovascular;  Laterality: N/A;  . Esophagogastroduodenoscopy  10/18/06    Done due to chronic GERD: Normal, bx showed no barrett's esophagus (Dr. [name])  . Colonoscopy w/ polypectomy  approx 2006; repeated 09/2011    Polyps on 2013 EGD as well, repeat 12/2014  . Transthoracic echocardiogram  08/25/10; 05/2012    mild asymmetric LVH,  normal systolic function, normal diastolic fxn, mild-to-mod mitral regurg, mild aortic valve sclerosis and trace AI, mild aortic root dilatation. 2014 f/u showed EF 40-45%, mod LAE, A FIB  . Cardiovascular stress test  2012    nuclear perfusion study: low risk scan  . Permanent pacemaker insertion N/A 02/05/2012    Procedure: PERMANENT PACEMAKER INSERTION;  Surgeon: [name], MD;  Location: [place] CATH LAB;  Service: Cardiovascular;  Laterality: N/A;    Outpatient Prescriptions Prior to Visit  Medication Sig Dispense Refill  . colchicine 0.6 MG tablet Take 0.6 mg by mouth as needed.   1  . gabapentin (NEURONTIN) 300  MG capsule TAKE 1 CAPSULE THREE TIMES A DAY 270 capsule 3  . glipiZIDE (GLUCOTROL XL) 5 MG 24 hr tablet Take 5 mg by mouth daily with breakfast.    . glucose blood test strip Use as instructed 100 each 3  . HYDROcodone-acetaminophen (NORCO/VICODIN) 5-325 MG per tablet Take 1 tablet by mouth every 6 (six) hours as needed for moderate pain. 30 tablet 0  . Lancets (PRECISION ULTRA LANCET) MISC Use as instructed 100 each 3  . losartan (COZAAR) 100 MG tablet TAKE 1 TABLET DAILY 90 tablet 1  . meclizine (ANTIVERT) 25 MG tablet Take 1 tablet (25 mg total) by mouth 3 (three) times daily as needed. For vertigo. 270 tablet 3  . metFORMIN (GLUCOPHAGE) 1000 MG tablet TAKE 1 TABLET TWICE A DAY 180 tablet 1  . NEXIUM 40 MG capsule TAKE 1 CAPSULE TWICE A DAY 180 capsule 1  . rivaroxaban (XARELTO) 20 MG TABS tablet Take 1 tablet (20 mg total) by mouth daily with supper. 90 tablet 1  . rOPINIRole (REQUIP) 1 MG tablet TAKE 1 TABLET DAILY 90 tablet 1  . sertraline (ZOLOFT) 50 MG tablet TAKE 1 TABLET DAILY 90 tablet 0  . simvastatin (ZOCOR) 20 MG tablet TAKE 1 TABLET EVERY EVENING 90 tablet 1  . sitaGLIPtin (JANUVIA) 100 MG tablet Take 1 tablet (100 mg total) by mouth daily. 90 tablet 3  . metoprolol succinate (TOPROL-XL) 25 MG 24 hr tablet Take 1 tablet (25 mg total) by mouth daily. 30 tablet 6  . amoxicillin-clavulanate (AUGMENTIN) 500-125 MG per tablet Take 1 tablet (500 mg total) by mouth 3 (three) times daily. (Patient not taking: Reported on 02/09/2015) 30 tablet 0  . cephALEXin (KEFLEX) 500 MG capsule Take 1 capsule (500 mg total) by mouth 4 (four) times daily. (Patient not taking: Reported on 02/09/2015) 20 capsule 0   No facility-administered medications prior to visit.    No Known Allergies  ROS As per HPI  PE: Blood pressure 140/92, pulse 77, temperature 98.1 F (36.7 C), temperature source Oral, resp. rate 16, height 5\' 10"  (1.778 m), weight 231 lb (104.781 kg), SpO2 97 %. Gen: Alert, well  appearing.  Patient is oriented to person, place, time, and situation. CV: RRR LUNGS: CTA bilat, nonlabored ABD: soft, ND, BS normal.  No HSM or bruit. Right mid/lower abd region (at the level of the umbillicus) has a mild amount of discomfort to palpation and there is a mildly full/indurated feeling to the subQ tissue in this area (feels like something within the abdominal wall).  LABS:  Lab Results  Component Value Date   TSH 2.31 02/05/2014   Lab Results  Component Value Date   WBC 4.4 02/05/2014   HGB 15.0 02/05/2014   HCT 44.9 02/05/2014   MCV 89.8 02/05/2014   PLT 177.0 02/05/2014   Lab Results  Component Value  Date   CREATININE 1.26 11/02/2014   BUN 21 11/02/2014   NA 139 11/02/2014   K 5.2* 11/02/2014   CL 104 11/02/2014   CO2 32 11/02/2014   Lab Results  Component Value Date   ALT 29 02/05/2014   AST 33 02/05/2014   ALKPHOS 70 02/05/2014   BILITOT 0.6 02/05/2014   Lab Results  Component Value Date   CHOL 133 02/12/2014   Lab Results  Component Value Date   HDL 44.30 02/12/2014   Lab Results  Component Value Date   LDLCALC 71 02/12/2014   Lab Results  Component Value Date   TRIG 87.0 02/12/2014   Lab Results  Component Value Date   CHOLHDL 3 02/12/2014   IMPRESSION AND PLAN:  Acute right mid/lower abd wall tenderness and question of mass in abd wall of this area. Will obtain limited abd u/s of the region of concern to see if there is a hematoma or hernia. If nothing shows up on ultrasound, we'll have to get CT of abdomen. CBC w/diff and BMET obtained today.  Spent 25 min with pt today, with >50% of this time spent in counseling and care coordination regarding the above problems.  An After Visit Summary was printed and given to the patient.  FOLLOW UP: To be determined based on results of pending workup.

## 2015-02-10 ENCOUNTER — Telehealth: Payer: Self-pay | Admitting: *Deleted

## 2015-02-10 NOTE — Telephone Encounter (Signed)
Pt LMOM requesting results from U/S. Please advise. Thanks.

## 2015-02-10 NOTE — Telephone Encounter (Signed)
Notified pt of benign findings. I spoke with Radiologist before-hand to confirm the dictated report. Watchful waiting approach.

## 2015-02-11 ENCOUNTER — Telehealth: Payer: Self-pay | Admitting: Family Medicine

## 2015-02-11 MED ORDER — SITAGLIPTIN PHOSPHATE 100 MG PO TABS: 100.0000 mg | ORAL_TABLET | Freq: Every day | ORAL | Status: DC

## 2015-02-11 NOTE — Telephone Encounter (Signed)
Spoke to pts wife she stated that pt p/u Hard copy and takes it to [facility] to have filled because he can get it for free. Rx printed and waiting for providers signature.

## 2015-02-11 NOTE — Telephone Encounter (Signed)
Patient would like to pick up paper Rx of Janiva 90 day supply tomorrow.

## 2015-02-15 ENCOUNTER — Ambulatory Visit: Payer: Medicare Other | Admitting: Family Medicine

## 2015-03-02 DIAGNOSIS — H26491 Other secondary cataract, right eye: Secondary | ICD-10-CM | POA: Diagnosis not present

## 2015-03-02 DIAGNOSIS — H31092 Other chorioretinal scars, left eye: Secondary | ICD-10-CM | POA: Diagnosis not present

## 2015-03-02 DIAGNOSIS — H4011X2 Primary open-angle glaucoma, moderate stage: Secondary | ICD-10-CM | POA: Diagnosis not present

## 2015-03-02 DIAGNOSIS — Z961 Presence of intraocular lens: Secondary | ICD-10-CM | POA: Diagnosis not present

## 2015-03-02 DIAGNOSIS — E119 Type 2 diabetes mellitus without complications: Secondary | ICD-10-CM | POA: Diagnosis not present

## 2015-03-02 LAB — HM DIABETES EYE EXAM

## 2015-03-11 ENCOUNTER — Other Ambulatory Visit: Payer: Self-pay | Admitting: Family Medicine

## 2015-03-11 NOTE — Telephone Encounter (Signed)
RF request for ropinirole LOV: 02/09/15 acute visit, last f/u 10/15/14 Next ov: None, over due for f/u Last written: 09/14/14 #90 w/ 1RF

## 2015-03-11 NOTE — Telephone Encounter (Signed)
Left message on home vm requesting pt call to schedule f/u with Dr. Anitra Lauth.

## 2015-03-17 DIAGNOSIS — Z961 Presence of intraocular lens: Secondary | ICD-10-CM | POA: Diagnosis not present

## 2015-03-17 DIAGNOSIS — H26491 Other secondary cataract, right eye: Secondary | ICD-10-CM | POA: Diagnosis not present

## 2015-03-17 DIAGNOSIS — H4011X2 Primary open-angle glaucoma, moderate stage: Secondary | ICD-10-CM | POA: Diagnosis not present

## 2015-03-25 ENCOUNTER — Encounter: Payer: Medicare Other | Admitting: Family Medicine

## 2015-03-25 ENCOUNTER — Ambulatory Visit (INDEPENDENT_AMBULATORY_CARE_PROVIDER_SITE_OTHER): Payer: Medicare Other | Admitting: Family Medicine

## 2015-03-25 ENCOUNTER — Encounter: Payer: Self-pay | Admitting: Family Medicine

## 2015-03-25 VITALS — BP 121/85 | HR 74 | Temp 97.7°F | Resp 16 | Ht 70.0 in | Wt 226.0 lb

## 2015-03-25 DIAGNOSIS — E1159 Type 2 diabetes mellitus with other circulatory complications: Secondary | ICD-10-CM | POA: Insufficient documentation

## 2015-03-25 DIAGNOSIS — E1165 Type 2 diabetes mellitus with hyperglycemia: Secondary | ICD-10-CM | POA: Insufficient documentation

## 2015-03-25 DIAGNOSIS — E785 Hyperlipidemia, unspecified: Secondary | ICD-10-CM | POA: Diagnosis not present

## 2015-03-25 DIAGNOSIS — E118 Type 2 diabetes mellitus with unspecified complications: Secondary | ICD-10-CM

## 2015-03-25 DIAGNOSIS — I10 Essential (primary) hypertension: Secondary | ICD-10-CM | POA: Diagnosis not present

## 2015-03-25 LAB — COMPREHENSIVE METABOLIC PANEL
ALT: 20 U/L (ref 0–53)
AST: 20 U/L (ref 0–37)
Albumin: 4.4 g/dL (ref 3.5–5.2)
Alkaline Phosphatase: 90 U/L (ref 39–117)
BUN: 34 mg/dL — ABNORMAL HIGH (ref 6–23)
CO2: 31 mEq/L (ref 19–32)
Calcium: 9.8 mg/dL (ref 8.4–10.5)
Chloride: 105 mEq/L (ref 96–112)
Creatinine, Ser: 1.43 mg/dL (ref 0.40–1.50)
GFR: 51.57 mL/min — ABNORMAL LOW (ref >60.00)
Glucose, Bld: 149 mg/dL — ABNORMAL HIGH (ref 70–99)
Potassium: 4.8 mEq/L (ref 3.5–5.1)
Sodium: 140 mEq/L (ref 135–145)
Total Bilirubin: 0.5 mg/dL (ref 0.2–1.2)
Total Protein: 7.6 g/dL (ref 6.0–8.3)

## 2015-03-25 LAB — LIPID PANEL
Cholesterol: 139 mg/dL (ref 0–200)
HDL: 35.4 mg/dL — ABNORMAL LOW (ref >39.00)
LDL Cholesterol: 66 mg/dL (ref 0–99)
NonHDL: 103.47
Total CHOL/HDL Ratio: 4
Triglycerides: 188 mg/dL — ABNORMAL HIGH (ref 0.0–149.0)
VLDL: 37.6 mg/dL (ref 0.0–40.0)

## 2015-03-25 LAB — HEMOGLOBIN A1C: Hgb A1c MFr Bld: 7.2 % — ABNORMAL HIGH (ref 4.6–6.5)

## 2015-03-25 MED ORDER — METOPROLOL SUCCINATE ER 50 MG PO TB24: 50.0000 mg | ORAL_TABLET | Freq: Every day | ORAL | Status: DC

## 2015-03-25 NOTE — Progress Notes (Signed)
Pre visit review using our clinic review tool, if applicable. No additional management support is needed unless otherwise documented below in the visit note. 

## 2015-03-25 NOTE — Progress Notes (Deleted)
OFFICE VISIT  03/25/2015   CC: No chief complaint on file.  HPI:    Patient is a 74 y.o. Caucasian male who presents for DM 2 f/u plus  Past Medical History  Diagnosis Date  . Chronic low back pain   . Hypertension   . Diabetes mellitus   . Depression   . Shortness of breath   . 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 GERD (gastroesophageal reflux disease)   . Arthritis   . Permanent atrial fibrillation     DCCV 07/09/13-converted, lasted two days, then back into afib--needs lifetime anticoagulation (Xarelto as of 09/2014)  . Left ventricular dysfunction 05/31/12    EF 40-45%, LA mod-severe dilated, AFIB  . History of cardiovascular stress test 05/28/12    no ischemia, EF 37%, imaging results are unchanged and within normal variance  . H/O tilt table evaluation 11/02/05    negative  . BPH (benign prostatic hypertrophy) 06/2011    Irritative sx's; pt declined trial of anticholinergic per Urology records  . History of vertigo     + Hx of posterior HA's.  Neuro (Dr. Erling Cruz) eval 2011.  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  [name] History of retinal detachment     OS  . History of chronic prostatitis   . Lumbar spondylosis     lumbosacral radiculopathy at L4 by EMG testing, right foot drop (neurologist is Dr. Linus Salmons with [facility] in [place])--neurologist referred him to neurosurgery  . Nephrolithiasis 07/2012    Left UVJ 2 mm stone with dilation of renal collecting system and slight hydroureter on right  . Episodic low back pain 01/22/2013    w/intermittent radiculitis (12/2014 his neurologist referred him to pain mgmt for epidural steroid injection)  . Chronic renal insufficiency, stage III (moderate) 2015    CrCl about 60 ml/min  . History of adenomatous polyp of colon 10/12/11    Dr. [name] (3 right side of colon- tubular adenomas  removed)    Past Surgical History  Procedure Laterality Date  . Eye surgery      retinal detachment OS+ cataract surgery  . Lumbar laminectomy  1976    left L4-5  . Insert / replace / remove pacemaker  02/05/2012    dual chamber, sinus node dysfunction, sinus arrest, PAF, Medtronic Revo serial#-[id number]  . Cardioversion  07/09/2012    Procedure: CARDIOVERSION;  Surgeon: [name], MD;  Location: [facility] ENDOSCOPY;  Service: Cardiovascular;  Laterality: N/A;  . Esophagogastroduodenoscopy  10/18/06    Done due to chronic GERD: Normal, bx showed no barrett's esophagus (Dr. [name])  . Colonoscopy w/ polypectomy  approx 2006; repeated 09/2011    Polyps on 2013 EGD as well, repeat 12/2014  . Transthoracic echocardiogram  08/25/10; 05/2012    mild asymmetric LVH, normal systolic function, normal diastolic fxn, mild-to-mod mitral regurg, mild aortic valve sclerosis and trace AI, mild aortic root dilatation. 2014 f/u showed EF 40-45%, mod LAE, A FIB  . Cardiovascular stress test  2012    nuclear perfusion study: low risk scan  . Permanent pacemaker insertion N/A 02/05/2012    Procedure: PERMANENT PACEMAKER INSERTION;  Surgeon: [name], MD;  Location: [place] CATH LAB;  Service: Cardiovascular;  Laterality: N/A;    Outpatient Prescriptions Prior to Visit  Medication Sig Dispense Refill  . colchicine 0.6 MG tablet Take 0.6 mg by mouth as needed.   1  .  gabapentin (NEURONTIN) 300 MG capsule TAKE 1 CAPSULE THREE TIMES A DAY 270 capsule 3  . glipiZIDE (GLUCOTROL XL) 5 MG 24 hr tablet Take 5 mg by mouth daily with breakfast.    . glucose blood test strip Use as instructed 100 each 3  . HYDROcodone-acetaminophen (NORCO/VICODIN) 5-325 MG per tablet Take 1 tablet by mouth every 6 (six) hours as needed for moderate pain. 30 tablet 0  . Lancets (PRECISION ULTRA LANCET) MISC Use as instructed 100 each 3  . losartan (COZAAR) 100 MG tablet TAKE 1 TABLET DAILY 90 tablet 1  . meclizine (ANTIVERT) 25 MG tablet  Take 1 tablet (25 mg total) by mouth 3 (three) times daily as needed. For vertigo. 270 tablet 3  . metFORMIN (GLUCOPHAGE) 1000 MG tablet TAKE 1 TABLET TWICE A DAY 180 tablet 1  . metoprolol succinate (TOPROL-XL) 25 MG 24 hr tablet Take 1 tablet (25 mg total) by mouth daily. 30 tablet 12  . NEXIUM 40 MG capsule TAKE 1 CAPSULE TWICE A DAY 180 capsule 1  . rivaroxaban (XARELTO) 20 MG TABS tablet Take 1 tablet (20 mg total) by mouth daily with supper. 90 tablet 1  . rOPINIRole (REQUIP) 1 MG tablet TAKE 1 TABLET DAILY 90 tablet 0  . sertraline (ZOLOFT) 50 MG tablet TAKE 1 TABLET DAILY 90 tablet 0  . simvastatin (ZOCOR) 20 MG tablet TAKE 1 TABLET EVERY EVENING 90 tablet 1  . sitaGLIPtin (JANUVIA) 100 MG tablet Take 1 tablet (100 mg total) by mouth daily. 90 tablet 3   No facility-administered medications prior to visit.    No Known Allergies  ROS As per HPI  PE: There were no vitals taken for this visit. ***  LABS:  Lab Results  Component Value Date   HGBA1C 7.5* 10/15/2014     Chemistry      Component Value Date/Time   NA 138 02/09/2015 1209   K 5.3* 02/09/2015 1209   CL 103 02/09/2015 1209   CO2 27 02/09/2015 1209   BUN 24* 02/09/2015 1209   CREATININE 1.17 02/09/2015 1209      Component Value Date/Time   CALCIUM 9.5 02/09/2015 1209   ALKPHOS 70 02/05/2014 1030   AST 33 02/05/2014 1030   ALT 29 02/05/2014 1030   BILITOT 0.6 02/05/2014 1030     Lab Results  Component Value Date   WBC 5.9 02/09/2015   HGB 14.6 02/09/2015   HCT 43.2 02/09/2015   MCV 89.4 02/09/2015   PLT 170.0 02/09/2015   Lab Results  Component Value Date   TSH 2.31 02/05/2014   IMPRESSION AND PLAN:  No problem-specific assessment & plan notes found for this encounter. due for a1c, AST/ALT,  Due for repeat colonoscopy (Dr. [name]). FOLLOW UP: No Follow-up on file.

## 2015-03-25 NOTE — Progress Notes (Signed)
OFFICE VISIT  03/25/2015   CC:  Chief Complaint  Patient presents with  . Follow-up   HPI:    Patient is a 74 y.o. Caucasian male who presents for f/u of his right side pain--this has resolved. Also routine recheck DM 2, htn, hyperlip. Fasting glucoses: avg 150.  No postprandial checks.  Only one episode of low glucose : when he had been working outside a lot. He is fasting.  Home bp monitoring: avg 160-170 over 80-90.  HR around 85.  No HA's, no dizziness, no palpitations, no CP, no SOB.  Past Medical History  Diagnosis Date  . Chronic low back pain   . Hypertension   . Diabetes mellitus   . Depression   . Shortness of breath   . 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 GERD (gastroesophageal reflux disease)   . Arthritis   . Permanent atrial fibrillation     DCCV 07/09/13-converted, lasted two days, then back into afib--needs lifetime anticoagulation (Xarelto as of 09/2014)  . Left ventricular dysfunction 05/31/12    EF 40-45%, LA mod-severe dilated, AFIB  . History of cardiovascular stress test 05/28/12    no ischemia, EF 37%, imaging results are unchanged and within normal variance  . H/O tilt table evaluation 11/02/05    negative  . BPH (benign prostatic hypertrophy) 06/2011    Irritative sx's; pt declined trial of anticholinergic per Urology records  . History of vertigo     + Hx of posterior HA's.  Neuro (Dr. Erling Cruz) eval 2011.  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  [name] History of retinal detachment     OS  . History of chronic prostatitis   . Lumbar spondylosis     lumbosacral radiculopathy at L4 by EMG testing, right foot drop (neurologist is Dr. Linus Salmons with [facility] in [place])--neurologist referred him to neurosurgery  . Nephrolithiasis 07/2012    Left UVJ 2 mm stone with dilation of renal collecting system and  slight hydroureter on right  . Episodic low back pain 01/22/2013    w/intermittent radiculitis (12/2014 his neurologist referred him to pain mgmt for epidural steroid injection)  . Chronic renal insufficiency, stage III (moderate) 2015    CrCl about 60 ml/min  . History of adenomatous polyp of colon 10/12/11    Dr. [name] (3 right side of colon- tubular adenomas removed)    Past Surgical History  Procedure Laterality Date  . Eye surgery      retinal detachment OS+ cataract surgery  . Lumbar laminectomy  1976    left L4-5  . Insert / replace / remove pacemaker  02/05/2012    dual chamber, sinus node dysfunction, sinus arrest, PAF, Medtronic Revo serial#-[id number]  . Cardioversion  07/09/2012    Procedure: CARDIOVERSION;  Surgeon: [name], MD;  Location[facility] ENDOSCOPY;  Service: Cardiovascular;  Laterality: N/A;  . Esophagogastroduodenoscopy  10/18/06    Done due to chronic GERD: Normal, bx showed no barrett's esophagus (Dr. [name])  . Colonoscopy w/ polypectomy  approx 2006; repeated 09/2011    Polyps on 2013 EGD as well, repeat 12/2014  . Transthoracic echocardiogram  08/25/10; 05/2012    mild asymmetric LVH, normal systolic function, normal diastolic fxn, mild-to-mod mitral regurg, mild aortic valve sclerosis and trace AI, mild aortic root dilatation. 2014 f/u showed EF 40-45%, mod LAE, A FIB  . Cardiovascular stress test  2012  nuclear perfusion study: low risk scan  . Permanent pacemaker insertion N/A 02/05/2012    Procedure: PERMANENT PACEMAKER INSERTION;  Surgeon: [name], MD;  Location: [place] CATH LAB;  Service: Cardiovascular;  Laterality: N/A;    Outpatient Prescriptions Prior to Visit  Medication Sig Dispense Refill  . colchicine 0.6 MG tablet Take 0.6 mg by mouth as needed.   1  . gabapentin (NEURONTIN) 300 MG capsule TAKE 1 CAPSULE THREE TIMES A DAY 270 capsule 3  . glipiZIDE (GLUCOTROL XL) 5 MG 24 hr tablet Take 5 mg by mouth daily with breakfast.    . glucose blood  test strip Use as instructed 100 each 3  . HYDROcodone-acetaminophen (NORCO/VICODIN) 5-325 MG per tablet Take 1 tablet by mouth every 6 (six) hours as needed for moderate pain. 30 tablet 0  . Lancets (PRECISION ULTRA LANCET) MISC Use as instructed 100 each 3  . losartan (COZAAR) 100 MG tablet TAKE 1 TABLET DAILY 90 tablet 1  . meclizine (ANTIVERT) 25 MG tablet Take 1 tablet (25 mg total) by mouth 3 (three) times daily as needed. For vertigo. 270 tablet 3  . metFORMIN (GLUCOPHAGE) 1000 MG tablet TAKE 1 TABLET TWICE A DAY 180 tablet 1  . metoprolol succinate (TOPROL-XL) 25 MG 24 hr tablet Take 1 tablet (25 mg total) by mouth daily. 30 tablet 12  . NEXIUM 40 MG capsule TAKE 1 CAPSULE TWICE A DAY 180 capsule 1  . rivaroxaban (XARELTO) 20 MG TABS tablet Take 1 tablet (20 mg total) by mouth daily with supper. 90 tablet 1  . rOPINIRole (REQUIP) 1 MG tablet TAKE 1 TABLET DAILY 90 tablet 0  . sertraline (ZOLOFT) 50 MG tablet TAKE 1 TABLET DAILY 90 tablet 0  . simvastatin (ZOCOR) 20 MG tablet TAKE 1 TABLET EVERY EVENING 90 tablet 1  . sitaGLIPtin (JANUVIA) 100 MG tablet Take 1 tablet (100 mg total) by mouth daily. 90 tablet 3   No facility-administered medications prior to visit.    No Known Allergies  ROS As per HPI  PE: Blood pressure 121/85, pulse 74, temperature 97.7 F (36.5 C), temperature source Oral, resp. rate 16, height 5\' 10"  (1.778 m), weight 226 lb (102.513 kg), SpO2 96 %. Gen: Alert, well appearing.  Patient is oriented to person, place, time, and situation. CV: RRR, no m/r/g.   LUNGS: CTA bilat, nonlabored resps, good aeration in all lung fields.   LABS:  Lab Results  Component Value Date   HGBA1C 7.5* 10/15/2014     Chemistry      Component Value Date/Time   NA 138 02/09/2015 1209   K 5.3* 02/09/2015 1209   CL 103 02/09/2015 1209   CO2 27 02/09/2015 1209   BUN 24* 02/09/2015 1209   CREATININE 1.17 02/09/2015 1209      Component Value Date/Time   CALCIUM 9.5  02/09/2015 1209   ALKPHOS 70 02/05/2014 1030   AST 33 02/05/2014 1030   ALT 29 02/05/2014 1030   BILITOT 0.6 02/05/2014 1030     Lab Results  Component Value Date   CHOL 133 02/12/2014   HDL 44.30 02/12/2014   LDLCALC 71 02/12/2014   TRIG 87.0 02/12/2014   CHOLHDL 3 02/12/2014   Lab Results  Component Value Date   TSH 2.31 02/05/2014   IMPRESSION AND PLAN:  1) DM 2, control fair. HbA1c today.  2) HTN; not well controlled systolics.  Increase toprol xl to 50mg  qd.  3) Hyperlipidemia: recheck FLP today.  AST/ALT today. Tolerating statin  well.  4) Right mid/lower abd wall pain: resolved.  An After Visit Summary was printed and given to the patient.  FOLLOW UP: No Follow-up on file.

## 2015-03-30 ENCOUNTER — Ambulatory Visit (INDEPENDENT_AMBULATORY_CARE_PROVIDER_SITE_OTHER): Payer: Medicare Other | Admitting: *Deleted

## 2015-03-30 ENCOUNTER — Telehealth: Payer: Self-pay | Admitting: Cardiology

## 2015-03-30 DIAGNOSIS — I495 Sick sinus syndrome: Secondary | ICD-10-CM | POA: Diagnosis not present

## 2015-03-30 NOTE — Progress Notes (Signed)
Remote pacemaker transmission.   

## 2015-03-30 NOTE — Telephone Encounter (Signed)
Confirmed remote transmission w/ pt wife.   

## 2015-04-01 ENCOUNTER — Other Ambulatory Visit: Payer: Self-pay | Admitting: Family Medicine

## 2015-04-06 ENCOUNTER — Other Ambulatory Visit: Payer: Self-pay | Admitting: Family Medicine

## 2015-04-06 LAB — CUP PACEART REMOTE DEVICE CHECK
Brady Statistic RV Percent Paced: 99.9 %
Date Time Interrogation Session: 20160913112305
Lead Channel Impedance Value: 424 Ohm
Lead Channel Impedance Value: 560 Ohm
Lead Channel Setting Pacing Amplitude: 2.5 V
Lead Channel Setting Pacing Pulse Width: 0.4 ms
Lead Channel Setting Sensing Sensitivity: 0.9 mV
Zone Setting Detection Interval: 400 ms
Zone Setting Detection Interval: 400 ms

## 2015-04-07 ENCOUNTER — Other Ambulatory Visit: Payer: Self-pay | Admitting: *Deleted

## 2015-04-07 ENCOUNTER — Other Ambulatory Visit: Payer: Self-pay | Admitting: Family Medicine

## 2015-04-07 NOTE — Telephone Encounter (Signed)
RF request for sertraline LOV: 03/25/15 Next ov: 04/19/15 Last written: 01/04/15 #90 w/ 0CX

## 2015-04-13 DIAGNOSIS — Z1211 Encounter for screening for malignant neoplasm of colon: Secondary | ICD-10-CM | POA: Diagnosis not present

## 2015-04-13 DIAGNOSIS — D122 Benign neoplasm of ascending colon: Secondary | ICD-10-CM | POA: Diagnosis not present

## 2015-04-13 DIAGNOSIS — K635 Polyp of colon: Secondary | ICD-10-CM | POA: Diagnosis not present

## 2015-04-13 DIAGNOSIS — Z86010 Personal history of colonic polyps: Secondary | ICD-10-CM | POA: Diagnosis not present

## 2015-04-22 ENCOUNTER — Encounter: Payer: Self-pay | Admitting: Family Medicine

## 2015-04-22 ENCOUNTER — Ambulatory Visit (INDEPENDENT_AMBULATORY_CARE_PROVIDER_SITE_OTHER): Payer: Medicare Other | Admitting: Family Medicine

## 2015-04-22 VITALS — BP 135/91 | HR 82 | Temp 98.0°F | Resp 16 | Wt 230.0 lb

## 2015-04-22 DIAGNOSIS — I10 Essential (primary) hypertension: Secondary | ICD-10-CM

## 2015-04-22 DIAGNOSIS — F32A Depression, unspecified: Secondary | ICD-10-CM

## 2015-04-22 DIAGNOSIS — F329 Major depressive disorder, single episode, unspecified: Secondary | ICD-10-CM

## 2015-04-22 MED ORDER — METOPROLOL SUCCINATE ER 25 MG PO TB24: 25.0000 mg | ORAL_TABLET | Freq: Every day | ORAL | Status: DC

## 2015-04-22 MED ORDER — SERTRALINE HCL 100 MG PO TABS: 100.0000 mg | ORAL_TABLET | Freq: Every day | ORAL | Status: DC

## 2015-04-22 NOTE — Progress Notes (Signed)
OFFICE NOTE  04/22/2015  CC:  Chief Complaint  Patient presents with  . Follow-up   HPI: Patient is a 74 y.o. Caucasian male who is here for 1 mo f/u HTN. Increased toprol xl to 50mg  qd last visit. Systolics 376-283, diastolics high 15V, rare 91.  No side effects from increased dose. No dizziness, vision c/o, or HAs.  No CP or SOB.  Takes zoloft 50mg  qd for a long time now; says he is still feeling irritable, easily angered, some depressed mood and excessive worry/rumination about the future.  No SI or HI.  Finds it tough to get enjoyment out of life.  Appetite comes and goes.  Not sleeping well--wakes up often and can't go back to sleep.   Pertinent PMH:  Past medical, surgical, social, and family history reviewed and no changes are noted since last office visit.  MEDS:  Outpatient Prescriptions Prior to Visit  Medication Sig Dispense Refill  . colchicine 0.6 MG tablet Take 0.6 mg by mouth as needed.   1  . gabapentin (NEURONTIN) 300 MG capsule TAKE 1 CAPSULE THREE TIMES A DAY 270 capsule 3  . glipiZIDE (GLUCOTROL XL) 5 MG 24 hr tablet Take 5 mg by mouth daily with breakfast.    . glucose blood test strip Use as instructed 100 each 3  . HYDROcodone-acetaminophen (NORCO/VICODIN) 5-325 MG per tablet Take 1 tablet by mouth every 6 (six) hours as needed for moderate pain. 30 tablet 0  . Lancets (PRECISION ULTRA LANCET) MISC Use as instructed 100 each 3  . losartan (COZAAR) 100 MG tablet TAKE 1 TABLET DAILY 90 tablet 1  . meclizine (ANTIVERT) 25 MG tablet Take 1 tablet (25 mg total) by mouth 3 (three) times daily as needed. For vertigo. 270 tablet 3  . metFORMIN (GLUCOPHAGE) 1000 MG tablet TAKE 1 TABLET TWICE A DAY 180 tablet 1  . metoprolol succinate (TOPROL-XL) 50 MG 24 hr tablet Take 1 tablet (50 mg total) by mouth daily. Take with or immediately following a meal. 30 tablet 1  . NEXIUM 40 MG capsule TAKE 1 CAPSULE TWICE A DAY 180 capsule 0  . rivaroxaban (XARELTO) 20 MG TABS tablet  Take 1 tablet (20 mg total) by mouth daily with supper. 90 tablet 1  . rOPINIRole (REQUIP) 1 MG tablet TAKE 1 TABLET DAILY 90 tablet 0  . sertraline (ZOLOFT) 50 MG tablet TAKE 1 TABLET DAILY 90 tablet 1  . simvastatin (ZOCOR) 20 MG tablet TAKE 1 TABLET EVERY EVENING 90 tablet 1  . sitaGLIPtin (JANUVIA) 100 MG tablet Take 1 tablet (100 mg total) by mouth daily. 90 tablet 3   No facility-administered medications prior to visit.    PE: Blood pressure 135/91, pulse 82, temperature 98 F (36.7 C), temperature source Oral, resp. rate 16, weight 230 lb (104.327 kg), SpO2 96 %. Gen: Alert, well appearing.  Patient is oriented to person, place, time, and situation. AFFECT: pleasant, lucid thought and speech.   IMPRESSION AND PLAN:  1) HTN, not ideal control but improving. Increase toprol xl to 75mg  total daily dose (50mg  + 25mg  daily).  2) Depression, not ideal control.  Increase zoloft to 100 mg qd. Therapeutic expectations and side effect profile of medication discussed today.  Patient's questions answered.  An After Visit Summary was printed and given to the patient.  FOLLOW UP: 1 mo

## 2015-04-22 NOTE — Progress Notes (Signed)
Pre visit review using our clinic review tool, if applicable. No additional management support is needed unless otherwise documented below in the visit note. 

## 2015-04-28 ENCOUNTER — Encounter: Payer: Self-pay | Admitting: Cardiology

## 2015-05-04 ENCOUNTER — Encounter: Payer: Self-pay | Admitting: Cardiovascular Disease

## 2015-05-20 ENCOUNTER — Encounter: Payer: Self-pay | Admitting: Family Medicine

## 2015-05-20 ENCOUNTER — Ambulatory Visit (INDEPENDENT_AMBULATORY_CARE_PROVIDER_SITE_OTHER): Payer: Medicare Other | Admitting: Family Medicine

## 2015-05-20 VITALS — BP 136/91 | HR 77 | Temp 98.4°F | Resp 16 | Ht 70.0 in | Wt 230.0 lb

## 2015-05-20 DIAGNOSIS — Z23 Encounter for immunization: Secondary | ICD-10-CM

## 2015-05-20 DIAGNOSIS — I10 Essential (primary) hypertension: Secondary | ICD-10-CM

## 2015-05-20 DIAGNOSIS — F329 Major depressive disorder, single episode, unspecified: Secondary | ICD-10-CM | POA: Diagnosis not present

## 2015-05-20 DIAGNOSIS — F32A Depression, unspecified: Secondary | ICD-10-CM

## 2015-05-20 NOTE — Progress Notes (Signed)
OFFICE VISIT  05/20/2015   CC:  Chief Complaint  Patient presents with  . Follow-up    HTN and Depression, pt is fasting.      HPI:    Patient is a 74 y.o. Caucasian male who presents for 1 mo f/u HTN and depression. Feels much improved on 100 mg zoloft, coping a lot better and feeling more at-ease.  Lost his bp cuff recently, so has no home bp #'s for me since last visit. No CP/SOB, HA, dizziness, or vision complaints.  No edema.   Past Medical History  Diagnosis Date  . Chronic low back pain   . Hypertension   . Diabetes mellitus   . Depression   . Shortness of breath   . 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 GERD (gastroesophageal reflux disease)   . Arthritis   . Permanent atrial fibrillation ([place])     DCCV 07/09/13-converted, lasted two days, then back into afib--needs lifetime anticoagulation (Xarelto as of 09/2014)  . Left ventricular dysfunction 05/31/12    EF 40-45%, LA mod-severe dilated, AFIB  . History of cardiovascular stress test 05/28/12    no ischemia, EF 37%, imaging results are unchanged and within normal variance  . H/O tilt table evaluation 11/02/05    negative  . BPH (benign prostatic hypertrophy) 06/2011    Irritative sx's; pt declined trial of anticholinergic per Urology records  . History of vertigo     + Hx of posterior HA's.  Neuro (Dr. Erling Cruz) eval 2011.  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  [name] History of retinal detachment     OS  . History of chronic prostatitis   . Lumbar spondylosis     lumbosacral radiculopathy at L4 by EMG testing, right foot drop (neurologist is Dr. Linus Salmons with [facility] in [place])--neurologist referred him to neurosurgery  . Nephrolithiasis 07/2012    Left UVJ 2 mm stone with dilation of renal collecting system and slight hydroureter on right  . Episodic low back  pain 01/22/2013    w/intermittent radiculitis (12/2014 his neurologist referred him to pain mgmt for epidural steroid injection)  . Chronic renal insufficiency, stage III (moderate) 2015    CrCl about 60 ml/min  . History of adenomatous polyp of colon 10/12/11    Dr. [name] (3 right side of colon- tubular adenomas removed)    Past Surgical History  Procedure Laterality Date  . Eye surgery      retinal detachment OS+ cataract surgery  . Lumbar laminectomy  1976    left L4-5  . Insert / replace / remove pacemaker  02/05/2012    dual chamber, sinus node dysfunction, sinus arrest, PAF, Medtronic Revo serial#-[id number]  . Cardioversion  07/09/2012    Procedure: CARDIOVERSION;  Surgeon: [name], MD;  Location: [facility] ENDOSCOPY;  Service: Cardiovascular;  Laterality: N/A;  . Esophagogastroduodenoscopy  10/18/06    Done due to chronic GERD: Normal, bx showed no barrett's esophagus (Dr. [name])  . Colonoscopy w/ polypectomy  approx 2006; repeated 09/2011    Polyps on 2013 EGD as well, repeat 12/2014  . Transthoracic echocardiogram  08/25/10; 05/2012    mild asymmetric LVH, normal systolic function, normal diastolic fxn, mild-to-mod mitral regurg, mild aortic valve sclerosis and trace AI, mild aortic root dilatation. 2014 f/u showed EF 40-45%, mod LAE, A FIB  . Cardiovascular stress test  2012    nuclear perfusion  study: low risk scan  . Permanent pacemaker insertion N/A 02/05/2012    Procedure: PERMANENT PACEMAKER INSERTION;  Surgeon: [name], MD;  Location: [place] CATH LAB;  Service: Cardiovascular;  Laterality: N/A;    Outpatient Prescriptions Prior to Visit  Medication Sig Dispense Refill  . colchicine 0.6 MG tablet Take 0.6 mg by mouth as needed.   1  . gabapentin (NEURONTIN) 300 MG capsule TAKE 1 CAPSULE THREE TIMES A DAY 270 capsule 3  . glipiZIDE (GLUCOTROL XL) 5 MG 24 hr tablet Take 5 mg by mouth daily with breakfast.    . glucose blood test strip Use as instructed 100 each 3  .  HYDROcodone-acetaminophen (NORCO/VICODIN) 5-325 MG per tablet Take 1 tablet by mouth every 6 (six) hours as needed for moderate pain. 30 tablet 0  . Lancets (PRECISION ULTRA LANCET) MISC Use as instructed 100 each 3  . losartan (COZAAR) 100 MG tablet TAKE 1 TABLET DAILY 90 tablet 1  . meclizine (ANTIVERT) 25 MG tablet Take 1 tablet (25 mg total) by mouth 3 (three) times daily as needed. For vertigo. 270 tablet 3  . metFORMIN (GLUCOPHAGE) 1000 MG tablet TAKE 1 TABLET TWICE A DAY 180 tablet 1  . metoprolol succinate (TOPROL-XL) 25 MG 24 hr tablet Take 1 tablet (25 mg total) by mouth daily. 90 tablet 3  . metoprolol succinate (TOPROL-XL) 50 MG 24 hr tablet Take 1 tablet (50 mg total) by mouth daily. Take with or immediately following a meal. 30 tablet 1  . NEXIUM 40 MG capsule TAKE 1 CAPSULE TWICE A DAY 180 capsule 0  . rivaroxaban (XARELTO) 20 MG TABS tablet Take 1 tablet (20 mg total) by mouth daily with supper. 90 tablet 1  . rOPINIRole (REQUIP) 1 MG tablet TAKE 1 TABLET DAILY 90 tablet 0  . sertraline (ZOLOFT) 100 MG tablet Take 1 tablet (100 mg total) by mouth daily. 90 tablet 1  . simvastatin (ZOCOR) 20 MG tablet TAKE 1 TABLET EVERY EVENING 90 tablet 1  . sitaGLIPtin (JANUVIA) 100 MG tablet Take 1 tablet (100 mg total) by mouth daily. 90 tablet 3   No facility-administered medications prior to visit.    No Known Allergies  ROS As per HPI  PE: Blood pressure 136/91, pulse 77, temperature 98.4 F (36.9 C), temperature source Oral, resp. rate 16, height 5\' 10"  (1.778 m), weight 230 lb (104.327 kg), SpO2 96 %. Gen: Alert, well appearing.  Patient is oriented to person, place, time, and situation. AFFECT: pleasant, lucid thought and speech. CV: RRR, S1 and S2 distant.  no m/r/g.   LUNGS: CTA bilat, nonlabored resps, good aeration in all lung fields.   LABS:  Lab Results  Component Value Date   TSH 2.31 02/05/2014   Lab Results  Component Value Date   WBC 5.9 02/09/2015   HGB 14.6  02/09/2015   HCT 43.2 02/09/2015   MCV 89.4 02/09/2015   PLT 170.0 02/09/2015   Lab Results  Component Value Date   CREATININE 1.43 03/25/2015   BUN 34* 03/25/2015   NA 140 03/25/2015   K 4.8 03/25/2015   CL 105 03/25/2015   CO2 31 03/25/2015   Lab Results  Component Value Date   ALT 20 03/25/2015   AST 20 03/25/2015   ALKPHOS 90 03/25/2015   BILITOT 0.5 03/25/2015   Lab Results  Component Value Date   CHOL 139 03/25/2015   Lab Results  Component Value Date   HDL 35.40* 03/25/2015   Lab Results  Component Value Date   LDLCALC 66 03/25/2015   Lab Results  Component Value Date   TRIG 188.0* 03/25/2015   Lab Results  Component Value Date   CHOLHDL 4 03/25/2015   Lab Results  Component Value Date   HGBA1C 7.2* 03/25/2015   IMPRESSION AND PLAN:  1) HTN: we'll continue with current meds/dosing. Encouraged him to check home bp when he finds his cuff so we can have some additional bp data--he'll call office with report of his numbers.  2) Depression: improved.  The current medical regimen is effective;  continue present plan and medications.  3) Prev health care: flu vaccine today.  An After Visit Summary was printed and given to the patient.  FOLLOW UP: Return in about 4 months (around 09/20/2015) for routine chronic illness f/u (30 min).

## 2015-05-20 NOTE — Progress Notes (Signed)
Pre visit review using our clinic review tool, if applicable. No additional management support is needed unless otherwise documented below in the visit note. 

## 2015-05-24 ENCOUNTER — Other Ambulatory Visit: Payer: Self-pay | Admitting: *Deleted

## 2015-05-24 MED ORDER — METOPROLOL SUCCINATE ER 50 MG PO TB24: 50.0000 mg | ORAL_TABLET | Freq: Every day | ORAL | Status: DC

## 2015-05-24 NOTE — Telephone Encounter (Signed)
RF request for metoprolol LOV:05/20/15 Next ov: 09/20/15 Last written: 03/25/15 #30 w/ 1RF  Please advise. Pt is also taking 25mg  metopolol.

## 2015-05-28 ENCOUNTER — Encounter: Payer: Self-pay | Admitting: Cardiovascular Disease

## 2015-06-01 ENCOUNTER — Telehealth: Payer: Self-pay | Admitting: Family Medicine

## 2015-06-01 MED ORDER — SITAGLIPTIN PHOSPHATE 100 MG PO TABS: 100.0000 mg | ORAL_TABLET | Freq: Every day | ORAL | Status: DC

## 2015-06-01 MED ORDER — MECLIZINE HCL 25 MG PO TABS: 25.0000 mg | ORAL_TABLET | Freq: Three times a day (TID) | ORAL | Status: DC | PRN

## 2015-06-01 NOTE — Telephone Encounter (Signed)
LOV: 05/20/15 NOV: 09/20/15  RF request for Januvia Last written: 02/11/15 #90 w/ 3RF  RF request for meclizine Last written: 02/12/14 #270 w/ 3RF  Rx printed and put up front for p/u. Pt advised and voiced understanding.

## 2015-06-01 NOTE — Telephone Encounter (Signed)
Pt called and is asking for a written RX for his Januvia and his Meclizine as he is going to [place] on Thurs. Please call when ready he will pick up./ dh

## 2015-06-08 ENCOUNTER — Other Ambulatory Visit: Payer: Self-pay | Admitting: Family Medicine

## 2015-06-08 DIAGNOSIS — D235 Other benign neoplasm of skin of trunk: Secondary | ICD-10-CM | POA: Diagnosis not present

## 2015-06-08 DIAGNOSIS — D3617 Benign neoplasm of peripheral nerves and autonomic nervous system of trunk, unspecified: Secondary | ICD-10-CM | POA: Diagnosis not present

## 2015-06-08 DIAGNOSIS — D225 Melanocytic nevi of trunk: Secondary | ICD-10-CM | POA: Diagnosis not present

## 2015-06-08 DIAGNOSIS — L570 Actinic keratosis: Secondary | ICD-10-CM | POA: Diagnosis not present

## 2015-06-08 DIAGNOSIS — X32XXXA Exposure to sunlight, initial encounter: Secondary | ICD-10-CM | POA: Diagnosis not present

## 2015-06-08 NOTE — Telephone Encounter (Signed)
RF request for requip LOV: 05/20/15 Next ov: 09/10/15 Last written: 03/11/15 #90 w/ 0RF

## 2015-06-11 ENCOUNTER — Telehealth: Payer: Self-pay | Admitting: Family Medicine

## 2015-06-11 MED ORDER — METOPROLOL SUCCINATE ER 25 MG PO TB24: 25.0000 mg | ORAL_TABLET | Freq: Every day | ORAL | Status: DC

## 2015-06-11 NOTE — Telephone Encounter (Signed)
Pt needs a refill on his Metoprolol 50mg  called into [facility]. He also would like a few pills called into [facility] at OR because he ran out completely today and needs a few to hold him over until his RX arrives.

## 2015-06-11 NOTE — Telephone Encounter (Signed)
Res[facility] to [facility] and sent [facility].

## 2015-06-13 ENCOUNTER — Other Ambulatory Visit: Payer: Self-pay | Admitting: Cardiovascular Disease

## 2015-06-22 ENCOUNTER — Encounter: Payer: Self-pay | Admitting: Cardiovascular Disease

## 2015-06-22 ENCOUNTER — Ambulatory Visit (INDEPENDENT_AMBULATORY_CARE_PROVIDER_SITE_OTHER): Payer: Medicare Other | Admitting: Cardiovascular Disease

## 2015-06-22 VITALS — BP 139/90 | HR 77 | Ht 71.0 in | Wt 233.6 lb

## 2015-06-22 DIAGNOSIS — I10 Essential (primary) hypertension: Secondary | ICD-10-CM | POA: Diagnosis not present

## 2015-06-22 DIAGNOSIS — I4430 Unspecified atrioventricular block: Secondary | ICD-10-CM | POA: Diagnosis not present

## 2015-06-22 DIAGNOSIS — I519 Heart disease, unspecified: Secondary | ICD-10-CM | POA: Diagnosis not present

## 2015-06-22 DIAGNOSIS — I482 Chronic atrial fibrillation: Secondary | ICD-10-CM

## 2015-06-22 DIAGNOSIS — Z950 Presence of cardiac pacemaker: Secondary | ICD-10-CM

## 2015-06-22 DIAGNOSIS — I4821 Permanent atrial fibrillation: Secondary | ICD-10-CM | POA: Insufficient documentation

## 2015-06-22 NOTE — Progress Notes (Signed)
Patient ID: Mason [name], male   DOB: 1941/02/19, 74 y.o.   MRN: [id number]     Cardiology Office Note   Date:  06/22/2015   ID:  Mason [name], DOB 1940/11/16, MRN [id number]  PCP:  Mason Sou, MD  Cardiologist:   Mason Klein, MD   Chief Complaint  Patient presents with  . Follow-up     no chest pain, occassional shortness of breath, no edema, has pain in legs, has cramping in legs,no lightheadedness, no dizziness      History of Present Illness: MR. RENNO is a 74 y.o. 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 He is pacemaker dependent. His device is a dual-chamber Medtronic MRI conditional pacemaker, but is programmed VVIR after failure to maintain normal sinus/atrial paced rhythm.  He is on chronic anticoagulation with Xarelto and has not had any bleeding complications or focal neurological events   pacemaker interrogation shows permanent atrial fibrillation and 100% ventricular pacing. Manually checked pacing threshold today was 1 V at 0.4 ms pulse width.  Lead impedance is stable and within normal limits. No episodes of high ventricular rate have been recorded. Another 6.5 years of generates a longevity is anticipated.  He feels well, without any cardiovascular complaints. He is very active (his pacemaker shows 6.8 Hours average daily activity).  He is mildly obese and has type 2 diabetes mellitus on oral antidiabetics , hyperlipidemia on statin and a history of gout. He does not have known coronary or other vascular disease.   his most recent hemoglobin A1c was close to target range at 7.2% and improved compared with the spring. His most recent lipid profile showed an LDL cholesterol of 66 but with mild hypertriglyceridemia at 188 and an HDL that was low at 35.  Past Medical History  Diagnosis Date  . Chronic low back pain   . Hypertension   . Diabetes mellitus   . Depression   . Shortness  of breath   . 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 GERD (gastroesophageal reflux disease)   . Arthritis   . Permanent atrial fibrillation ([place])     DCCV 07/09/13-converted, lasted two days, then back into afib--needs lifetime anticoagulation (Xarelto as of 09/2014)  . Left ventricular dysfunction 05/31/12    EF 40-45%, LA mod-severe dilated, AFIB  . History of cardiovascular stress test 05/28/12    no ischemia, EF 37%, imaging results are unchanged and within normal variance  . H/O tilt table evaluation 11/02/05    negative  . BPH (benign prostatic hypertrophy) 06/2011    Irritative sx's; pt declined trial of anticholinergic per Urology records  . History of vertigo     + Hx of posterior HA's.  Neuro (Dr. Erling [name]) eval 2011.  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  [name] History of retinal detachment     OS  . History of chronic prostatitis   . Lumbar spondylosis     lumbosacral radiculopathy at L4 by EMG testing, right foot drop (neurologist is Dr. Linus [name] with [facility] in [place])--neurologist referred him to neurosurgery  . Nephrolithiasis 07/2012    Left UVJ 2 mm stone with dilation of renal collecting system and slight hydroureter on right  . Episodic low back pain 01/22/2013    w/intermittent radiculitis (12/2014 his neurologist referred him to pain mgmt for  epidural steroid injection)  . Chronic renal insufficiency, stage III (moderate) 2015    CrCl about 60 ml/min  . History of adenomatous polyp of colon 10/12/11    Dr. Benson [name] (3 right side of colon- tubular adenomas removed)    Past Surgical History  Procedure Laterality Date  . Eye surgery      retinal detachment OS+ cataract surgery  . Lumbar laminectomy  1976    left L4-5  . Insert / replace / remove pacemaker  02/05/2012    dual chamber, sinus node dysfunction, sinus arrest,  PAF, Medtronic Revo serial#-[id number]  . Cardioversion  07/09/2012    Procedure: CARDIOVERSION;  Surgeon: Mason Klein, MD;  Location: [facility] ENDOSCOPY;  Service: Cardiovascular;  Laterality: N/A;  . Esophagogastroduodenoscopy  10/18/06    Done due to chronic GERD: Normal, bx showed no barrett's esophagus (Dr. Benson [name])  . Colonoscopy w/ polypectomy  approx 2006; repeated 09/2011    Polyps on 2013 EGD as well, repeat 12/2014  . Transthoracic echocardiogram  08/25/10; 05/2012    mild asymmetric LVH, normal systolic function, normal diastolic fxn, mild-to-mod mitral regurg, mild aortic valve sclerosis and trace AI, mild aortic root dilatation. 2014 f/u showed EF 40-45%, mod LAE, A FIB  . Cardiovascular stress test  2012    nuclear perfusion study: low risk scan  . Permanent pacemaker insertion N/A 02/05/2012    Procedure: PERMANENT PACEMAKER INSERTION;  Surgeon: Mason Klein, MD;  Location: [place] CATH LAB;  Service: Cardiovascular;  Laterality: N/A;     Current Outpatient Prescriptions  Medication Sig Dispense Refill  . colchicine 0.6 MG tablet Take 0.6 mg by mouth as needed.   1  . gabapentin (NEURONTIN) 300 MG capsule TAKE 1 CAPSULE THREE TIMES A DAY 270 capsule 3  . glipiZIDE (GLUCOTROL XL) 5 MG 24 hr tablet Take 5 mg by mouth daily with breakfast.    . glucose blood test strip Use as instructed 100 each 3  . Lancets (PRECISION ULTRA LANCET) MISC Use as instructed 100 each 3  . latanoprost (XALATAN) 0.005 % ophthalmic solution Place 1 drop into both eyes at bedtime. Instill 1 drop in each eye at bedtime  12  . losartan (COZAAR) 100 MG tablet TAKE 1 TABLET DAILY 90 tablet 1  . meclizine (ANTIVERT) 25 MG tablet Take 1 tablet (25 mg total) by mouth 3 (three) times daily as needed. For vertigo. 270 tablet 0  . metFORMIN (GLUCOPHAGE) 1000 MG tablet TAKE 1 TABLET TWICE A DAY 180 tablet 1  . metoprolol succinate (TOPROL-XL) 25 MG 24 hr tablet Take 1 tablet (25 mg total) by mouth daily. 30 tablet 0  .  metoprolol succinate (TOPROL-XL) 50 MG 24 hr tablet Take 1 tablet (50 mg total) by mouth daily. Take with or immediately following a meal. 90 tablet 3  . NEXIUM 40 MG capsule TAKE 1 CAPSULE TWICE A DAY 180 capsule 0  . rOPINIRole (REQUIP) 1 MG tablet TAKE 1 TABLET DAILY (NEED OFFICE VISIT FOR MORE REFILLS) 90 tablet 1  . sertraline (ZOLOFT) 100 MG tablet Take 1 tablet (100 mg total) by mouth daily. 90 tablet 1  . simvastatin (ZOCOR) 20 MG tablet TAKE 1 TABLET EVERY EVENING 90 tablet 1  . sitaGLIPtin (JANUVIA) 100 MG tablet Take 1 tablet (100 mg total) by mouth daily. 90 tablet 0  . XARELTO 20 MG TABS tablet TAKE 1 TABLET DAILY WITH SUPPER 90 tablet 0   No current facility-administered medications for this visit.    Allergies:   Review  of patient's allergies indicates no known allergies.    Social History:  The patient  reports that he has never smoked. He has never used smokeless tobacco. He reports that he drinks about 7.2 oz of alcohol per week. He reports that he does not use illicit drugs.   Family History:  The patient's family history includes Heart failure in his mother; Stroke in his father and mother.    ROS:  Please see the history of present illness.    Otherwise, review of systems positive for none.   All other systems are reviewed and negative.    PHYSICAL EXAM: VS:  BP 139/90 mmHg  Pulse 77  Ht 5\' 11"  (1.803 m)  Wt 233 lb 9 oz (105.943 kg)  BMI 32.59 kg/m2 , BMI Body mass index is 32.59 kg/(m^2).  General: Alert, oriented x3, no distress Head: no evidence of trauma, PERRL, EOMI, no exophtalmos or lid lag, no myxedema, no xanthelasma; normal ears, nose and oropharynx Neck: normal jugular venous pulsations and no hepatojugular reflux; brisk carotid pulses without delay and no carotid bruits Chest: clear to auscultation, no signs of consolidation by percussion or palpation, normal fremitus, symmetrical and full respiratory excursions Cardiovascular: normal position and  quality of the apical impulse, regular rhythm, normal first and paradoxically split second heart sounds, no murmurs, rubs or gallops Abdomen: no tenderness or distention, no masses by palpation, no abnormal pulsatility or arterial bruits, normal bowel sounds, no hepatosplenomegaly Extremities: no clubbing, cyanosis or edema; 2+ radial, ulnar and brachial pulses bilaterally; 2+ right femoral, posterior tibial and dorsalis pedis pulses; 2+ left femoral, posterior tibial and dorsalis pedis pulses; no subclavian or femoral bruits Neurological: grossly nonfocal Psych: euthymic mood, full affect   EKG:  EKG is ordered today. The ekg ordered today demonstrates  Atrial fibrillation and 100% ventricular pacing   Recent Labs: 02/09/2015: Hemoglobin 14.6; Platelets 170.0 03/25/2015: ALT 20; BUN 34*; Creatinine, Ser 1.43; Potassium 4.8; Sodium 140    Lipid Panel    Component Value Date/Time   CHOL 139 03/25/2015 1042   TRIG 188.0* 03/25/2015 1042   HDL 35.40* 03/25/2015 1042   CHOLHDL 4 03/25/2015 1042   VLDL 37.6 03/25/2015 1042   LDLCALC 66 03/25/2015 1042      Wt Readings from Last 3 Encounters:  06/22/15 233 lb 9 oz (105.943 kg)  05/20/15 230 lb (104.327 kg)  04/22/15 230 lb (104.327 kg)     ASSESSMENT AND PLAN:  1. Atrial fibrillation, permanent  He should remain on lifelong anticoagulation barring serious hemorrhagic complications.  2. 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 No R waves could be detected at this last pacemaker check. He should be considered pacemaker dependent. Note a mildly depressed left ventricular systolic function with an ejection fraction of 40-45% by echocardiography. If he should ever develop symptoms of congestive heart failure there is the option to upgrade his device to a cardiac resynchronization pacemaker (biventricular  pacemaker).  3. Cardiac pacemaker 02/05/12 Dual-chamber Medtronic Revo MRI conditional device was implanted for high-grade second-degree atrioventricular block. Normal device check today. No episodes of ventricular tachyarrhythmia recorded. 100% ventricular pacing. Note that the pacemaker sensor has recorded an average of >8 hours of activity every day. The device is programmed VVIR secondary permanent atrial fibrillation. He has been essentially uninterrupted atrial fibrillation for over a year now. His battery and lead parameters are within normal range. Heart rate histogram  distribution is favorable.  4.  Obesity/metabolic syndrome/diabetes mellitus/low HDL dyslipidemia  Reinforced the importance of weight loss and healthy diet to avoid complications of diabetes mellitus and vascular disease.     Current medicines are reviewed at length with the patient today.  The patient does not have concerns regarding medicines.  No changes in his medicines appeared necessary at this time, but lifestyle changes would be highly beneficial.  The following changes have been made:  no change  Labs/ tests ordered today include:   Orders Placed This Encounter  Procedures  . EKG 12-Lead    Patient Instructions  Remote monitoring is used to monitor your Pacemaker of ICD from home. This monitoring reduces the number of office visits required to check your device to one time per year. It allows [place] to keep an eye on the functioning of your device to ensure it is working properly. You are scheduled for a device check from home 0N 09-22-15. You may send your transmission at any time that day. If you have a wireless device, the transmission will be sent automatically. After your physician reviews your transmission, you will receive a postcard with your next transmission date.  Your physician wants you to follow-up in: [place] will receive a reminder letter in the mail two months in advance. If  you don't receive a letter, please call our office to schedule the follow-up appointment.          Mikael Spray, MD  06/22/2015 2:40 PM    Mason Klein, MD, [facility] [phone] office [phone] pager

## 2015-06-22 NOTE — Patient Instructions (Signed)
Remote monitoring is used to monitor your Pacemaker of ICD from home. This monitoring reduces the number of office visits required to check your device to one time per year. It allows [place] to keep an eye on the functioning of your device to ensure it is working properly. You are scheduled for a device check from home 0N 09-22-15. You may send your transmission at any time that day. If you have a wireless device, the transmission will be sent automatically. After your physician reviews your transmission, you will receive a postcard with your next transmission date.  Your physician wants you to follow-up in: [place] will receive a reminder letter in the mail two months in advance. If you don't receive a letter, please call our office to schedule the follow-up appointment.

## 2015-06-23 ENCOUNTER — Encounter: Payer: Self-pay | Admitting: Family Medicine

## 2015-07-01 LAB — CUP PACEART INCLINIC DEVICE CHECK
Battery Voltage: 2.96 V
Brady Statistic RV Percent Paced: 99.92 %
Date Time Interrogation Session: 20161129172200
Implantable Lead Implant Date: 20130715
Implantable Lead Implant Date: 20130715
Implantable Lead Location: 753859
Implantable Lead Location: 753860
Implantable Lead Model: 5086
Implantable Lead Model: 5086
Lead Channel Impedance Value: 416 Ohm
Lead Channel Impedance Value: 576 Ohm
Lead Channel Setting Pacing Amplitude: 2.5 V
Lead Channel Setting Pacing Pulse Width: 0.4 ms
Lead Channel Setting Sensing Sensitivity: 0.9 mV

## 2015-07-03 ENCOUNTER — Other Ambulatory Visit: Payer: Self-pay | Admitting: Family Medicine

## 2015-07-06 ENCOUNTER — Encounter: Payer: Medicare Other | Admitting: Cardiovascular Disease

## 2015-07-06 DIAGNOSIS — M5136 Other intervertebral disc degeneration, lumbar region: Secondary | ICD-10-CM | POA: Diagnosis not present

## 2015-07-06 DIAGNOSIS — M9943 Connective tissue stenosis of neural canal of lumbar region: Secondary | ICD-10-CM | POA: Diagnosis not present

## 2015-07-07 ENCOUNTER — Encounter: Payer: Medicare Other | Admitting: Cardiovascular Disease

## 2015-07-16 DIAGNOSIS — Z125 Encounter for screening for malignant neoplasm of prostate: Secondary | ICD-10-CM | POA: Diagnosis not present

## 2015-07-16 DIAGNOSIS — N401 Enlarged prostate with lower urinary tract symptoms: Secondary | ICD-10-CM | POA: Diagnosis not present

## 2015-07-27 DIAGNOSIS — N401 Enlarged prostate with lower urinary tract symptoms: Secondary | ICD-10-CM | POA: Diagnosis not present

## 2015-07-27 DIAGNOSIS — R3915 Urgency of urination: Secondary | ICD-10-CM | POA: Diagnosis not present

## 2015-07-27 DIAGNOSIS — N3281 Overactive bladder: Secondary | ICD-10-CM | POA: Diagnosis not present

## 2015-08-20 ENCOUNTER — Encounter (HOSPITAL_BASED_OUTPATIENT_CLINIC_OR_DEPARTMENT_OTHER): Payer: Self-pay | Admitting: *Deleted

## 2015-08-20 ENCOUNTER — Emergency Department (HOSPITAL_BASED_OUTPATIENT_CLINIC_OR_DEPARTMENT_OTHER)
Admission: EM | Admit: 2015-08-20 | Discharge: 2015-08-20 | Disposition: A | Discharge status: Home / Self Care | Payer: Medicare Other | Attending: Emergency Medicine | Admitting: Emergency Medicine

## 2015-08-20 DIAGNOSIS — Z7984 Long term (current) use of oral hypoglycemic drugs: Secondary | ICD-10-CM | POA: Insufficient documentation

## 2015-08-20 DIAGNOSIS — E119 Type 2 diabetes mellitus without complications: Secondary | ICD-10-CM | POA: Diagnosis not present

## 2015-08-20 DIAGNOSIS — Z950 Presence of cardiac pacemaker: Secondary | ICD-10-CM | POA: Diagnosis not present

## 2015-08-20 DIAGNOSIS — Z8669 Personal history of other diseases of the nervous system and sense organs: Secondary | ICD-10-CM | POA: Insufficient documentation

## 2015-08-20 DIAGNOSIS — I482 Chronic atrial fibrillation: Secondary | ICD-10-CM | POA: Insufficient documentation

## 2015-08-20 DIAGNOSIS — Z7901 Long term (current) use of anticoagulants: Secondary | ICD-10-CM | POA: Diagnosis not present

## 2015-08-20 DIAGNOSIS — Z79899 Other long term (current) drug therapy: Secondary | ICD-10-CM | POA: Insufficient documentation

## 2015-08-20 DIAGNOSIS — G8929 Other chronic pain: Secondary | ICD-10-CM | POA: Diagnosis not present

## 2015-08-20 DIAGNOSIS — Z8719 Personal history of other diseases of the digestive system: Secondary | ICD-10-CM | POA: Diagnosis not present

## 2015-08-20 DIAGNOSIS — F329 Major depressive disorder, single episode, unspecified: Secondary | ICD-10-CM | POA: Insufficient documentation

## 2015-08-20 DIAGNOSIS — M13169 Monoarthritis, not elsewhere classified, unspecified knee: Secondary | ICD-10-CM | POA: Insufficient documentation

## 2015-08-20 DIAGNOSIS — M25561 Pain in right knee: Secondary | ICD-10-CM | POA: Diagnosis present

## 2015-08-20 DIAGNOSIS — M13161 Monoarthritis, not elsewhere classified, right knee: Secondary | ICD-10-CM | POA: Diagnosis not present

## 2015-08-20 DIAGNOSIS — N183 Chronic kidney disease, stage 3 (moderate): Secondary | ICD-10-CM | POA: Insufficient documentation

## 2015-08-20 DIAGNOSIS — Z87442 Personal history of urinary calculi: Secondary | ICD-10-CM | POA: Insufficient documentation

## 2015-08-20 DIAGNOSIS — I129 Hypertensive chronic kidney disease with stage 1 through stage 4 chronic kidney disease, or unspecified chronic kidney disease: Secondary | ICD-10-CM | POA: Insufficient documentation

## 2015-08-20 DIAGNOSIS — Z86010 Personal history of colonic polyps: Secondary | ICD-10-CM | POA: Insufficient documentation

## 2015-08-20 LAB — SYNOVIAL CELL COUNT + DIFF, W/ CRYSTALS
Crystals, Fluid: NONE SEEN
Eosinophils-Synovial: 0 % (ref 0–1)
Lymphocytes-Synovial Fld: 0 % (ref 0–20)
Monocyte-Macrophage-Synovial Fluid: 6 % — ABNORMAL LOW (ref 50–90)
Neutrophil, Synovial: 94 % — ABNORMAL HIGH (ref 0–25)
WBC, Synovial: 420 /mm3 — ABNORMAL HIGH (ref 0–200)

## 2015-08-20 MED ORDER — OXYCODONE-ACETAMINOPHEN 5-325 MG PO TABS: 1.0000 | ORAL_TABLET | ORAL | Status: DC | PRN

## 2015-08-20 MED ORDER — LIDOCAINE HCL (PF) 1 % IJ SOLN
5.0000 mL | Freq: Once | INTRAMUSCULAR | Status: AC
  Administered 2015-08-20: 5 mL
  Filled 2015-08-20: qty 5

## 2015-08-20 MED ORDER — BUPIVACAINE HCL (PF) 0.5 % IJ SOLN
10.0000 mL | Freq: Once | INTRAMUSCULAR | Status: AC
  Administered 2015-08-20: 10 mL
  Filled 2015-08-20: qty 10

## 2015-08-20 MED ORDER — COLCHICINE 0.6 MG PO TABS: 0.6000 mg | ORAL_TABLET | Freq: Every day | ORAL | Status: DC

## 2015-08-20 NOTE — Discharge Instructions (Signed)
Knee Arthrocentesis Knee arthrocentesis is a procedure to remove fluid from your knee joint. The knee joint is a closed space that is lined by a membrane (synovial membrane). This membrane makes fluid to lubricate your joint, but sometimes this space fills with blood or other fluids. This can cause pain, swelling, and stiffness in your knee. You may have this procedure to remove fluid from a swollen knee or to get a sample of knee fluid for testing. Testing your knee fluid can help your health care provider to determine the cause of your knee pain or swelling. It can also help your health care provider to diagnose diseases such as gout, arthritis, and infections. Removing excess fluid from your knee may also be done to relieve your symptoms. LET [facility] CARE PROVIDER KNOW ABOUT:  Any allergies you have.  All medicines you are taking, including vitamins, herbs, eye drops, creams, and over-the-counter medicines.  Previous problems you or members of your family have had with the use of anesthetics.  Any blood disorders you have.  Previous surgeries you have had.  Any medical conditions you may have. RISKS AND COMPLICATIONS Generally, this is a safe procedure. However, problems may occur, including:  Infection.  Bleeding.  Bruising.  Swelling.  Damage to your knee joint.  An allergic reaction to the numbing medicine. BEFORE THE PROCEDURE  Ask your health care provider about changing or stopping your regular medicines. This is especially important if you are taking diabetes medicines or blood thinners.  Plan to have someone take you home after the procedure. PROCEDURE  You will sit or lie down in a position for your knee to be treated.  Your knee will be cleaned with a germ-killing solution (antiseptic).  You will be given a medicine that numbs the area (local anesthetic). You may feel some stinging.  After your knee becomes numb, a health care provider will insert a long,  thin needle into the side of your knee joint. You may feel some pressure.  Your health care provider will pull back the syringe on the needle to remove fluid. Your health care provider may press on your knee to help remove the fluid.  At the end of the procedure, the needle will be removed.  A bandage (dressing) will be placed over the puncture site. The procedure may vary among health care providers and hospitals. AFTER THE PROCEDURE  Your blood pressure, heart rate, breathing rate, and blood oxygen level will be monitored often until the medicines you were given have worn off.   This information is not intended to replace advice given to you by your health care provider. Make sure you discuss any questions you have with your health care provider.   Document Released: 09/28/2006 Document Revised: 07/31/2014 Document Reviewed: 05/20/2014 Elsevier Interactive Patient Education [employer].

## 2015-08-20 NOTE — ED Notes (Signed)
Given d/c instructions as per chart. Verbalizes understanding. No questions. Rx x 2.

## 2015-08-20 NOTE — ED Notes (Signed)
Pain in his right knee. No known injury.

## 2015-08-20 NOTE — ED Provider Notes (Signed)
CSN: [id number]     Arrival date & time 08/20/15  1842 History  By signing my name below, I, Emmanuella Mensah, attest that this documentation has been prepared under the direction and in the presence of Malvin Johns, MD. Electronically Signed: Judithann Sauger, ED Scribe. 08/20/2015. 7:11 PM.    Chief Complaint  Patient presents with  . Knee Pain   The history is provided by the patient. No language interpreter was used.   HPI Comments: Mason [name] is a 75 y.o. male with a hx of gout, HTN, and DM who presents to the Emergency Department complaining of gradually worsening moderate constant right knee pain and swelling onset yesterday afternoon. He reports associated redness to the area and difficulty ambulating secondary to pain. He states that he took Ibuprofen and his son's gout medication yesterday with no relief. He denies any recent falls, injuries, or trauma. He adds that his right knee pain and swelling flares up from time to time. 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 He reports NKDA. No fever, n/v/d, lacerations, or rash.    Past Medical History  Diagnosis Date  . Chronic low back pain   . Hypertension   . Diabetes mellitus   . Depression   . Shortness of breath   . 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 GERD (gastroesophageal reflux disease)   . Arthritis   . Permanent atrial fibrillation ([place])     DCCV 07/09/13-converted, lasted two days, then back into afib--needs lifetime anticoagulation (Xarelto as of 09/2014)  . Left ventricular dysfunction 05/31/12    EF 40-45%, LA mod-severe dilated, AFIB  . History of cardiovascular stress test 05/28/12    no ischemia, EF 37%, imaging results are unchanged and within normal variance  . H/O tilt table evaluation 11/02/05    negative  . BPH (benign prostatic hypertrophy) 06/2011    Irritative sx's; pt declined trial of  anticholinergic per Urology records  . History of vertigo     + Hx of posterior HA's.  Neuro (Dr. Erling Cruz) eval 2011.  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  [name] History of retinal detachment     OS  . History of chronic prostatitis   . Lumbar spondylosis     lumbosacral radiculopathy at L4 by EMG testing, right foot drop (neurologist is Dr. Linus Salmons with [facility] in [place])--neurologist referred him to neurosurgery  . Nephrolithiasis 07/2012    Left UVJ 2 mm stone with dilation of renal collecting system and slight hydroureter on right  . Episodic low back pain 01/22/2013    w/intermittent radiculitis (12/2014 his neurologist referred him to pain mgmt for epidural steroid injection)  . Chronic renal insufficiency, stage III (moderate) 2015    CrCl about 60 ml/min  . History of adenomatous polyp of colon 10/12/11    Dr. [name] (3 right side of colon- tubular adenomas removed)   Past Surgical History  Procedure Laterality Date  . Eye surgery      retinal detachment OS+ cataract surgery  . Lumbar laminectomy  1976    left L4-5  . Insert / replace / remove pacemaker  02/05/2012    dual chamber, sinus node dysfunction, sinus arrest, PAF, Medtronic Revo serial#[id number] H: last checked 05/2015  . Cardioversion  07/09/2012    Procedure: CARDIOVERSION;  Surgeon: [name], MD;  Location:  [place] [facility];  Service: Cardiovascular;  Laterality: N/A;  . Esophagogastroduodenoscopy  10/18/06    Done due to chronic GERD: Normal, bx showed no barrett's esophagus (Dr. [name])  . Colonoscopy w/ polypectomy  approx 2006; repeated 09/2011    Polyps on 2013 EGD as well, repeat 12/2014  . Transthoracic echocardiogram  08/25/10; 05/2012    mild asymmetric LVH, normal systolic function, normal diastolic fxn, mild-to-mod mitral regurg, mild aortic valve sclerosis and trace AI, mild aortic root dilatation. 2014 f/u showed EF 40-45%,  mod LAE, A FIB  . Cardiovascular stress test  2012    nuclear perfusion study: low risk scan  . Permanent pacemaker insertion N/A 02/05/2012    Procedure: PERMANENT PACEMAKER INSERTION;  Surgeon: [name], MD;  Location: [place] [facility];  Service: Cardiovascular;  Laterality: N/A;   Family History  Problem Relation Age of Onset  . Heart failure Mother   . Stroke Mother   . Stroke Father    Social History  Substance Use Topics  . Smoking status: Never Smoker   . Smokeless tobacco: Never Used  . Alcohol Use: 7.2 oz/week    12 Cans of beer per week     Comment: 12 beers per week    Review of Systems  Constitutional: Negative for fever.  Gastrointestinal: Negative for nausea and vomiting.  Musculoskeletal: Positive for joint swelling and arthralgias. Negative for back pain and neck pain.  Skin: Negative for wound.  Neurological: Negative for weakness, numbness and headaches.  All other systems reviewed and are negative.     Allergies  Review of patient's allergies indicates no known allergies.  Home Medications   Prior to Admission medications   Medication Sig Start Date End Date Taking? Authorizing Provider  colchicine 0.6 MG tablet Take 1 tablet (0.6 mg total) by mouth daily. 08/20/15   Malvin Johns, MD  gabapentin (NEURONTIN) 300 MG capsule TAKE 1 CAPSULE THREE TIMES A DAY 10/26/14   Tammi Sou, MD  glipiZIDE (GLUCOTROL XL) 5 MG 24 hr tablet Take 5 mg by mouth daily with breakfast.    Historical Provider, MD  glucose blood test strip Use as instructed 11/12/14   Tammi Sou, MD  Lancets (PRECISION ULTRA LANCET) MISC Use as instructed 11/12/14   Tammi Sou, MD  latanoprost (XALATAN) 0.005 % ophthalmic solution Place 1 drop into both eyes at bedtime. Instill 1 drop in each eye at bedtime 04/24/15   Historical Provider, MD  losartan (COZAAR) 100 MG tablet TAKE 1 TABLET DAILY 04/01/15   Tammi Sou, MD  meclizine (ANTIVERT) 25 MG tablet Take 1 tablet (25 mg  total) by mouth 3 (three) times daily as needed. For vertigo. 06/01/15   Tammi Sou, MD  metFORMIN (GLUCOPHAGE) 1000 MG tablet TAKE 1 TABLET TWICE A DAY 04/01/15   Tammi Sou, MD  metoprolol succinate (TOPROL-XL) 25 MG 24 hr tablet Take 1 tablet (25 mg total) by mouth daily. 06/11/15   Tammi Sou, MD  metoprolol succinate (TOPROL-XL) 50 MG 24 hr tablet Take 1 tablet (50 mg total) by mouth daily. Take with or immediately following a meal. 05/24/15   Tammi Sou, MD  NEXIUM 40 MG capsule TAKE 1 CAPSULE TWICE A DAY 07/05/15   Tammi Sou, MD  oxyCODONE-acetaminophen (PERCOCET) 5-325 MG tablet Take 1-2 tablets by mouth every 4 (four) hours as needed. 08/20/15   Malvin Johns, MD  rOPINIRole (REQUIP) 1 MG tablet TAKE 1 TABLET DAILY (NEED OFFICE VISIT FOR MORE  REFILLS) 06/08/15   Tammi Sou, MD  sertraline (ZOLOFT) 100 MG tablet Take 1 tablet (100 mg total) by mouth daily. 04/22/15   Tammi Sou, MD  simvastatin (ZOCOR) 20 MG tablet TAKE 1 TABLET EVERY EVENING 04/01/15   Tammi Sou, MD  sitaGLIPtin (JANUVIA) 100 MG tablet Take 1 tablet (100 mg total) by mouth daily. 06/01/15   Tammi Sou, MD  XARELTO 20 MG TABS tablet TAKE 1 TABLET DAILY WITH SUPPER 06/14/15   Mihai Croitoru, MD   BP 150/95 mmHg  Pulse 72  Temp(Src) 98 F (36.7 C) (Oral)  Resp 20  Ht 5\' 11"  (1.803 m)  Wt 233 lb (105.688 kg)  BMI 32.51 kg/m2  SpO2 99% Physical Exam  Constitutional: He is oriented to person, place, and time. He appears well-developed and well-nourished.  HENT:  Head: Normocephalic and atraumatic.  Neck: Normal range of motion. Neck supple.  Cardiovascular: Normal rate.   Pulmonary/Chest: Effort normal.  Musculoskeletal: He exhibits edema and tenderness.  Positive swelling with joint effusion to the right knee. There is some warmth and erythema to the medial aspect of the joint. There is tenderness on palpation and range of motion the knee. He is neurovascularly intact  distally. No rashes or wounds are noted.  Neurological: He is alert and oriented to person, place, and time.  Skin: Skin is warm and dry.  Psychiatric: He has a normal mood and affect.    ED Course  .Joint Aspiration/Arthrocentesis Date/Time: 08/20/2015 8:27 PM Performed by: Malvin Johns Authorized by: Malvin Johns Consent: Written consent obtained. Risks and benefits: risks, benefits and alternatives were discussed Consent given by: patient Patient understanding: patient states understanding of the procedure being performed Patient consent: the patient's understanding of the procedure matches consent given Procedure consent: procedure consent matches procedure scheduled Relevant documents: relevant documents present and verified Test results: test results available and properly labeled Patient identity confirmed: verbally with patient Time out: Immediately prior to procedure a "time out" was called to verify the correct patient, procedure, equipment, support staff and site/side marked as required. Indications: joint swelling and diagnostic evaluation  Body area: knee Local anesthesia used: yes Anesthesia: local infiltration Local anesthetic: bupivacaine 0.25% without epinephrine and lidocaine 1% without epinephrine Anesthetic total: 2 ml Patient sedated: no Preparation: Patient was prepped and draped in the usual sterile fashion. Needle gauge: 18 G Ultrasound guidance: no Approach: medial Aspirate: serous and yellow Aspirate amount: 15 mL Bupivacaine 0.25% amount: 4 ml Lidocaine 1% amount: 1 ml Patient tolerance: Patient tolerated the procedure well with no immediate complications   (including critical care time) DIAGNOSTIC STUDIES: Oxygen Saturation is 100% on RA, normal by my interpretation.    COORDINATION OF CARE: 7:09 PM- Pt advised of plan for treatment and pt agrees. Explained the process to pull fluid from right knee joint for further evaluation.    Labs  Review Labs Reviewed  SYNOVIAL CELL COUNT + DIFF, W/ CRYSTALS - Abnormal; Notable for the following:    Color, Synovial YELLOW (*)    Appearance-Synovial HAZY (*)    WBC, Synovial 420 (*)    Neutrophil, Synovial 94 (*)    Monocyte-Macrophage-Synovial Fluid 6 (*)    All other components within normal limits  BODY FLUID CULTURE    Imaging Review No results found.   Malvin Johns, MD has personally reviewed and evaluated these images and lab results as part of her medical decision-making.  MDM   Final diagnoses:  Inflammation of joint of knee,  right    Patient presents with swelling of his right knee with associated warmth and erythema. He does have a prior history of gout in his toes but not previously in his knee although he has had similar type swelling in his knee before. I feel his symptoms are most consistent with a gouty arthritis flareup. However an arthrocentesis was performed by me to both rule out a septic arthritis as well as provide him symptomatic relief. This was done without complications. He did have some pain improvement following this. A dressing was applied. Given that he is on Xarelto, and an ace wrap was placed over the joint as well although there is no increased swelling or hematoma that has developed prior to discharge.  Patient does not want to wait for the results of the aspiration. I did advise him that I would contact him if it looks concerning. I did advise him to follow-up with his PCP if his symptoms are not improved on Monday or return here as needed for any worsening symptoms. He was requesting a prescription for colchicine as he has used this in the past and it's helped. He was given a prescription for colchicine and Percocet for pain.  22:55 joint fluid has WBC of 420, unlikely to be septic arthritis.  Culture pending.  I personally performed the services described in this documentation, which was scribed in my presence.  The recorded information has been  reviewed and considered.   Malvin Johns, MD 08/20/15 2255

## 2015-08-21 LAB — GRAM STAIN

## 2015-08-23 ENCOUNTER — Ambulatory Visit: Payer: Medicare Other | Admitting: Family Medicine

## 2015-08-25 LAB — CULTURE, BODY FLUID W GRAM STAIN -BOTTLE

## 2015-08-25 LAB — CULTURE, BODY FLUID-BOTTLE: Culture: NO GROWTH

## 2015-09-07 ENCOUNTER — Other Ambulatory Visit: Payer: Self-pay | Admitting: *Deleted

## 2015-09-07 MED ORDER — SITAGLIPTIN PHOSPHATE 100 MG PO TABS: 100.0000 mg | ORAL_TABLET | Freq: Every day | ORAL | Status: DC

## 2015-09-07 NOTE — Telephone Encounter (Signed)
Pt LMOM on 09/07/15 at 9:58am stating that he needs Rx for Januvia, printed so he can take it to [facility].   RF request for Januvia LOV: 05/20/15 Next ov: 09/20/15 Last written: 06/01/15 #90 w/ 0RF  Rx printed. Pt advised and voiced understanding.

## 2015-09-10 ENCOUNTER — Other Ambulatory Visit: Payer: Self-pay | Admitting: Cardiovascular Disease

## 2015-09-14 DIAGNOSIS — M19012 Primary osteoarthritis, left shoulder: Secondary | ICD-10-CM | POA: Diagnosis not present

## 2015-09-15 DIAGNOSIS — Z961 Presence of intraocular lens: Secondary | ICD-10-CM | POA: Diagnosis not present

## 2015-09-15 DIAGNOSIS — H401112 Primary open-angle glaucoma, right eye, moderate stage: Secondary | ICD-10-CM | POA: Diagnosis not present

## 2015-09-15 DIAGNOSIS — H401123 Primary open-angle glaucoma, left eye, severe stage: Secondary | ICD-10-CM | POA: Diagnosis not present

## 2015-09-20 ENCOUNTER — Ambulatory Visit (INDEPENDENT_AMBULATORY_CARE_PROVIDER_SITE_OTHER): Payer: Medicare Other | Admitting: Family Medicine

## 2015-09-20 ENCOUNTER — Encounter: Payer: Self-pay | Admitting: Family Medicine

## 2015-09-20 VITALS — BP 156/106 | HR 87 | Temp 98.0°F | Resp 16 | Ht 71.0 in | Wt 230.5 lb

## 2015-09-20 DIAGNOSIS — E785 Hyperlipidemia, unspecified: Secondary | ICD-10-CM

## 2015-09-20 DIAGNOSIS — E118 Type 2 diabetes mellitus with unspecified complications: Secondary | ICD-10-CM

## 2015-09-20 DIAGNOSIS — I10 Essential (primary) hypertension: Secondary | ICD-10-CM

## 2015-09-20 DIAGNOSIS — N182 Chronic kidney disease, stage 2 (mild): Secondary | ICD-10-CM

## 2015-09-20 LAB — BASIC METABOLIC PANEL
BUN: 19 mg/dL (ref 6–23)
CO2: 29 mEq/L (ref 19–32)
Calcium: 9.8 mg/dL (ref 8.4–10.5)
Chloride: 100 mEq/L (ref 96–112)
Creatinine, Ser: 1.29 mg/dL (ref 0.40–1.50)
GFR: 58 mL/min — ABNORMAL LOW (ref >60.00)
Glucose, Bld: 128 mg/dL — ABNORMAL HIGH (ref 70–99)
Potassium: 4.5 mEq/L (ref 3.5–5.1)
Sodium: 137 mEq/L (ref 135–145)

## 2015-09-20 LAB — LIPID PANEL
Cholesterol: 128 mg/dL (ref 0–200)
HDL: 43.8 mg/dL (ref >39.00)
LDL Cholesterol: 63 mg/dL (ref 0–99)
NonHDL: 84.57
Total CHOL/HDL Ratio: 3
Triglycerides: 109 mg/dL (ref 0.0–149.0)
VLDL: 21.8 mg/dL (ref 0.0–40.0)

## 2015-09-20 LAB — HEMOGLOBIN A1C: Hgb A1c MFr Bld: 7.4 % — ABNORMAL HIGH (ref 4.6–6.5)

## 2015-09-20 MED ORDER — METOPROLOL SUCCINATE ER 100 MG PO TB24: 100.0000 mg | ORAL_TABLET | Freq: Every day | ORAL | Status: DC

## 2015-09-20 NOTE — Progress Notes (Signed)
Pre visit review using our clinic review tool, if applicable. No additional management support is needed unless otherwise documented below in the visit note. 

## 2015-09-20 NOTE — Progress Notes (Signed)
OFFICE VISIT  09/20/2015   CC:  Chief Complaint  Patient presents with  . Follow-up    Pt is fasting.      HPI:    Patient is a 75 y.o. Caucasian male who presents for 4 mo f/u DM 2, HTN, HLD. He had f/u with his cardiologist, Dr. Orene Desanctis, in November 2016 for his permanent a-fib and complete HF (has PACER) and everything checked out good, no changes made, was told to f/u in 1 yr.  HTN:  He's concerned about his bp not being well controlled over the last 1 mo. Home bp [id number] systolic over 80 diastolic, unknown rate.  DM 2: checks gluc 2-3 times per week.  He has poor recollection of his numbers but estimates 140-180 depending on what he eats.  Takes zocor 20mg  qd w/out side effect.  He eats whatever he wants, admits he is addicted to sweets and bread.  ROS: no CP, no SOB, no focal weakness, no LE swelling, no orthopnea or PND, no HAs or vision complaints.  Past Medical History  Diagnosis Date  . Chronic low back pain   . Hypertension   . Diabetes mellitus   . Depression   . Shortness of breath   . 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 GERD (gastroesophageal reflux disease)   . Arthritis   . Permanent atrial fibrillation ([place])     DCCV 07/09/13-converted, lasted two days, then back into afib--needs lifetime anticoagulation (Xarelto as of 09/2014)  . Left ventricular dysfunction 05/31/12    EF 40-45%, LA mod-severe dilated, AFIB  . History of cardiovascular stress test 05/28/12    no ischemia, EF 37%, imaging results are unchanged and within normal variance  . H/O tilt table evaluation 11/02/05    negative  . BPH (benign prostatic hypertrophy) 06/2011    Irritative sx's; pt declined trial of anticholinergic per Urology records  . History of vertigo     + Hx of posterior HA's.  Neuro (Dr. Erling Cruz) eval 2011.  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  Congenitally small posterior  circulation.  [name] History of retinal detachment     OS  . History of chronic prostatitis   . Lumbar spondylosis     lumbosacral radiculopathy at L4 by EMG testing, right foot drop (neurologist is Dr. Linus Salmons with [facility] in [place])--neurologist referred him to neurosurgery  . Nephrolithiasis 07/2012    Left UVJ 2 mm stone with dilation of renal collecting system and slight hydroureter on right  . Episodic low back pain 01/22/2013    w/intermittent radiculitis (12/2014 his neurologist referred him to pain mgmt for epidural steroid injection)  . Chronic renal insufficiency, stage III (moderate) 2015    CrCl about 60 ml/min  . History of adenomatous polyp of colon 10/12/11    Dr. [name] (3 right side of colon- tubular adenomas removed)    Past Surgical History  Procedure Laterality Date  . Eye surgery      retinal detachment OS+ cataract surgery  . Lumbar laminectomy  1976    left L4-5  . Insert / replace / remove pacemaker  02/05/2012    dual chamber, sinus node dysfunction, sinus arrest, PAF, Medtronic Revo serial#[id number] H: last checked 05/2015  . Cardioversion  07/09/2012    Procedure: CARDIOVERSION;  Surgeon: [name], MD;  Location: [facility] ENDOSCOPY;  Service: Cardiovascular;  Laterality: N/A;  . Esophagogastroduodenoscopy  10/18/06    Done  due to chronic GERD: Normal, bx showed no barrett's esophagus (Dr. [name])  . Colonoscopy w/ polypectomy  approx 2006; repeated 09/2011    Polyps on 2013 EGD as well, repeat 12/2014  . Transthoracic echocardiogram  08/25/10; 05/2012    mild asymmetric LVH, normal systolic function, normal diastolic fxn, mild-to-mod mitral regurg, mild aortic valve sclerosis and trace AI, mild aortic root dilatation. 2014 f/u showed EF 40-45%, mod LAE, A FIB  . Cardiovascular stress test  2012    nuclear perfusion study: low risk scan  . Permanent pacemaker insertion N/A 02/05/2012    Procedure: PERMANENT PACEMAKER INSERTION;  Surgeon: [name],  MD;  Location: [place] CATH LAB;  Service: Cardiovascular;  Laterality: N/A;    Outpatient Prescriptions Prior to Visit  Medication Sig Dispense Refill  . colchicine 0.6 MG tablet Take 1 tablet (0.6 mg total) by mouth daily. 15 tablet 0  . gabapentin (NEURONTIN) 300 MG capsule TAKE 1 CAPSULE THREE TIMES A DAY 270 capsule 3  . glipiZIDE (GLUCOTROL XL) 5 MG 24 hr tablet Take 5 mg by mouth daily with breakfast.    . glucose blood test strip Use as instructed 100 each 3  . Lancets (PRECISION ULTRA LANCET) MISC Use as instructed 100 each 3  . latanoprost (XALATAN) 0.005 % ophthalmic solution Place 1 drop into both eyes at bedtime. Instill 1 drop in each eye at bedtime  12  . losartan (COZAAR) 100 MG tablet TAKE 1 TABLET DAILY 90 tablet 1  . meclizine (ANTIVERT) 25 MG tablet Take 1 tablet (25 mg total) by mouth 3 (three) times daily as needed. For vertigo. 270 tablet 0  . metFORMIN (GLUCOPHAGE) 1000 MG tablet TAKE 1 TABLET TWICE A DAY 180 tablet 1  . NEXIUM 40 MG capsule TAKE 1 CAPSULE TWICE A DAY 180 capsule 1  . oxyCODONE-acetaminophen (PERCOCET) 5-325 MG tablet Take 1-2 tablets by mouth every 4 (four) hours as needed. 20 tablet 0  . rOPINIRole (REQUIP) 1 MG tablet TAKE 1 TABLET DAILY (NEED OFFICE VISIT FOR MORE REFILLS) 90 tablet 1  . sertraline (ZOLOFT) 100 MG tablet Take 1 tablet (100 mg total) by mouth daily. 90 tablet 1  . simvastatin (ZOCOR) 20 MG tablet TAKE 1 TABLET EVERY EVENING 90 tablet 1  . sitaGLIPtin (JANUVIA) 100 MG tablet Take 1 tablet (100 mg total) by mouth daily. 90 tablet 0  . XARELTO 20 MG TABS tablet TAKE 1 TABLET DAILY WITH SUPPER 90 tablet 1  . metoprolol succinate (TOPROL-XL) 25 MG 24 hr tablet Take 1 tablet (25 mg total) by mouth daily. 30 tablet 0  . metoprolol succinate (TOPROL-XL) 50 MG 24 hr tablet Take 1 tablet (50 mg total) by mouth daily. Take with or immediately following a meal. 90 tablet 3   No facility-administered medications prior to visit.    No Known  Allergies  ROS As per HPI  PE: Blood pressure 156/106, pulse 87, temperature 98 F (36.7 C), temperature source Oral, resp. rate 16, height 5\' 11"  (1.803 m), weight 230 lb 8 oz (104.554 kg), SpO2 96 %. Gen: Alert, well appearing.  Patient is oriented to person, place, time, and situation. CV: RRR, no m/r/g.   LUNGS: CTA bilat, nonlabored resps, good aeration in all lung fields. EXT: no clubbing, cyanosis, or edema.  Foot exam - bilateral normal; no swelling, tenderness or skin or vascular lesions. Color and temperature is normal. Sensation is intact. Peripheral pulses are palpable. Toenails are normal.   LABS:  Lab Results  Component  Value Date   TSH 2.31 02/05/2014   Lab Results  Component Value Date   WBC 5.9 02/09/2015   HGB 14.6 02/09/2015   HCT 43.2 02/09/2015   MCV 89.4 02/09/2015   PLT 170.0 02/09/2015   Lab Results  Component Value Date   CREATININE 1.43 03/25/2015   BUN 34* 03/25/2015   NA 140 03/25/2015   K 4.8 03/25/2015   CL 105 03/25/2015   CO2 31 03/25/2015   Lab Results  Component Value Date   ALT 20 03/25/2015   AST 20 03/25/2015   ALKPHOS 90 03/25/2015   BILITOT 0.5 03/25/2015   Lab Results  Component Value Date   CHOL 139 03/25/2015   Lab Results  Component Value Date   HDL 35.40* 03/25/2015   Lab Results  Component Value Date   LDLCALC 66 03/25/2015   Lab Results  Component Value Date   TRIG 188.0* 03/25/2015   Lab Results  Component Value Date   CHOLHDL 4 03/25/2015   Lab Results  Component Value Date   HGBA1C 7.2* 03/25/2015   IMPRESSION AND PLAN:  1) DM 2; control not ideal per home glucoses.  Pt noncompliant with monitoring and diet. Feet exam normal today. HbA1c today.  2) HLD: tolerating statin, AST/ALT good 03/2015.  Repeat FLP today. Needs to eat low carb/low fat diet but pt not contemplating this at this time.  3) HTN; not ideal control (systolic).   Increase toprol xl to 100 mg qd. Encouraged pt to monitor bp  and HR daily until I see him for f/u of this problem again in 2-3 wks. Lytes/cr check today.  4) CRI, stage II/III: GFR in the 60's. BMET today.  An After Visit Summary was printed and given to the patient.  FOLLOW UP: Return for f/u 2-3 wks HTN.

## 2015-09-22 ENCOUNTER — Telehealth: Payer: Self-pay | Admitting: Cardiology

## 2015-09-22 ENCOUNTER — Ambulatory Visit (INDEPENDENT_AMBULATORY_CARE_PROVIDER_SITE_OTHER): Payer: Medicare Other | Admitting: *Deleted

## 2015-09-22 DIAGNOSIS — I495 Sick sinus syndrome: Secondary | ICD-10-CM

## 2015-09-22 NOTE — Telephone Encounter (Signed)
Spoke with pt and reminded pt of remote transmission that is due today. Pt verbalized understanding.   

## 2015-09-22 NOTE — Progress Notes (Signed)
Remote pacemaker transmission.   

## 2015-09-28 ENCOUNTER — Other Ambulatory Visit: Payer: Self-pay | Admitting: Family Medicine

## 2015-09-28 NOTE — Telephone Encounter (Signed)
LOV 09/20/2015 with PCP.   Sending rf for 6 months.

## 2015-09-30 ENCOUNTER — Other Ambulatory Visit: Payer: Self-pay | Admitting: Family Medicine

## 2015-10-06 DIAGNOSIS — H401123 Primary open-angle glaucoma, left eye, severe stage: Secondary | ICD-10-CM | POA: Diagnosis not present

## 2015-10-11 ENCOUNTER — Other Ambulatory Visit: Payer: Self-pay

## 2015-10-11 ENCOUNTER — Ambulatory Visit (INDEPENDENT_AMBULATORY_CARE_PROVIDER_SITE_OTHER): Payer: Medicare Other | Admitting: Family Medicine

## 2015-10-11 ENCOUNTER — Telehealth: Payer: Self-pay | Admitting: Family Medicine

## 2015-10-11 ENCOUNTER — Encounter: Payer: Self-pay | Admitting: Family Medicine

## 2015-10-11 VITALS — BP 148/101 | HR 77 | Temp 97.6°F | Ht 71.0 in | Wt 230.8 lb

## 2015-10-11 DIAGNOSIS — I10 Essential (primary) hypertension: Secondary | ICD-10-CM | POA: Diagnosis not present

## 2015-10-11 MED ORDER — LOSARTAN POTASSIUM 100 MG PO TABS: 100.0000 mg | ORAL_TABLET | Freq: Every day | ORAL | Status: DC

## 2015-10-11 NOTE — Patient Instructions (Signed)
Restart your losartan (cozaar) 100 mg every day. Continue Toprol XL 100 mg every day. Continue to monitor your blood pressure and heart rate

## 2015-10-11 NOTE — Telephone Encounter (Signed)
Pt states that the bp meds he has been taking is not lowering his bp and wanted Dr Anitra Lauth to know

## 2015-10-11 NOTE — Progress Notes (Signed)
OFFICE VISIT  10/11/2015   CC:  Chief Complaint  Patient presents with  . Hypertension     HPI:    Patient is a 75 y.o. Caucasian male who presents for 3 week f/u HTN. Last visit I increased his Toprol XL from 75mg  qd to 100 mg qd.  He says he has NOT been taking his losartan --but I never told him not to.  There was a miscommunication last o/v. He brought in home bp measurements today: avg syst is upper 140s, avg diast is upper 90s, avg HR is 75.  ROS: he has DOE with going up inclines, also occ has PND.  No orthopnea, no cough, no CP, no palpitations, no dizziness, no fevers.  Past Medical History  Diagnosis Date  . Chronic low back pain   . Hypertension   . Diabetes mellitus   . Depression   . Shortness of breath   . 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 GERD (gastroesophageal reflux disease)   . Arthritis   . Permanent atrial fibrillation ([place])     DCCV 07/09/13-converted, lasted two days, then back into afib--needs lifetime anticoagulation (Xarelto as of 09/2014)  . Left ventricular dysfunction 05/31/12    EF 40-45%, LA mod-severe dilated, AFIB  . History of cardiovascular stress test 05/28/12    no ischemia, EF 37%, imaging results are unchanged and within normal variance  . H/O tilt table evaluation 11/02/05    negative  . BPH (benign prostatic hypertrophy) 06/2011    Irritative sx's; pt declined trial of anticholinergic per Urology records  . History of vertigo     + Hx of posterior HA's.  Neuro (Dr. Erling Cruz) eval 2011.  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  [name] History of retinal detachment     OS  . History of chronic prostatitis   . Lumbar spondylosis     lumbosacral radiculopathy at L4 by EMG testing, right foot drop (neurologist is Dr. Linus Salmons with [facility] in [place])--neurologist referred him to neurosurgery   . Nephrolithiasis 07/2012    Left UVJ 2 mm stone with dilation of renal collecting system and slight hydroureter on right  . Episodic low back pain 01/22/2013    w/intermittent radiculitis (12/2014 his neurologist referred him to pain mgmt for epidural steroid injection)  . Chronic renal insufficiency, stage III (moderate) 2015    CrCl about 60 ml/min  . History of adenomatous polyp of colon 10/12/11    Dr. [name] (3 right side of colon- tubular adenomas removed)    Past Surgical History  Procedure Laterality Date  . Eye surgery      retinal detachment OS+ cataract surgery  . Lumbar laminectomy  1976    left L4-5  . Insert / replace / remove pacemaker  02/05/2012    dual chamber, sinus node dysfunction, sinus arrest, PAF, Medtronic Revo serial#[id number] H: last checked 05/2015  . Cardioversion  07/09/2012    Procedure: CARDIOVERSION;  Surgeon: [name], MD;  Location: [facility] ENDOSCOPY;  Service: Cardiovascular;  Laterality: N/A;  . Esophagogastroduodenoscopy  10/18/06    Done due to chronic GERD: Normal, bx showed no barrett's esophagus (Dr. [name])  . Colonoscopy w/ polypectomy  approx 2006; repeated 09/2011    Polyps on 2013 EGD as well, repeat 12/2014  . Transthoracic echocardiogram  08/25/10; 05/2012    mild asymmetric LVH, normal systolic function, normal diastolic fxn, mild-to-mod mitral  regurg, mild aortic valve sclerosis and trace AI, mild aortic root dilatation. 2014 f/u showed EF 40-45%, mod LAE, A FIB  . Cardiovascular stress test  2012    nuclear perfusion study: low risk scan  . Permanent pacemaker insertion N/A 02/05/2012    Procedure: PERMANENT PACEMAKER INSERTION;  Surgeon: [name], MD;  Location: [place] CATH LAB;  Service: Cardiovascular;  Laterality: N/A;    Outpatient Prescriptions Prior to Visit  Medication Sig Dispense Refill  . colchicine 0.6 MG tablet Take 1 tablet (0.6 mg total) by mouth daily. 15 tablet 0  . gabapentin (NEURONTIN) 300 MG capsule TAKE 1 CAPSULE  THREE TIMES A DAY 270 capsule 3  . glipiZIDE (GLIPIZIDE XL) 5 MG 24 hr tablet Take 1 tablet (5 mg total) by mouth every morning. 90 tablet 1  . glipiZIDE (GLUCOTROL XL) 5 MG 24 hr tablet Take 5 mg by mouth daily with breakfast.    . glucose blood test strip Use as instructed 100 each 3  . Lancets (PRECISION ULTRA LANCET) MISC Use as instructed 100 each 3  . latanoprost (XALATAN) 0.005 % ophthalmic solution Place 1 drop into both eyes at bedtime. Instill 1 drop in each eye at bedtime  12  . meclizine (ANTIVERT) 25 MG tablet Take 1 tablet (25 mg total) by mouth 3 (three) times daily as needed. For vertigo. 270 tablet 0  . metFORMIN (GLUCOPHAGE) 1000 MG tablet Take 1 tablet (1,000 mg total) by mouth 2 (two) times daily. 180 tablet 1  . metoprolol succinate (TOPROL-XL) 100 MG 24 hr tablet Take 1 tablet (100 mg total) by mouth daily. Take with or immediately following a meal. 30 tablet 3  . NEXIUM 40 MG capsule TAKE 1 CAPSULE TWICE A DAY 180 capsule 1  . oxyCODONE-acetaminophen (PERCOCET) 5-325 MG tablet Take 1-2 tablets by mouth every 4 (four) hours as needed. 20 tablet 0  . rOPINIRole (REQUIP) 1 MG tablet TAKE 1 TABLET DAILY (NEED OFFICE VISIT FOR MORE REFILLS) 90 tablet 1  . sertraline (ZOLOFT) 100 MG tablet TAKE 1 TABLET DAILY 90 tablet 0  . simvastatin (ZOCOR) 20 MG tablet Take 1 tablet (20 mg total) by mouth every evening. 90 tablet 1  . sitaGLIPtin (JANUVIA) 100 MG tablet Take 1 tablet (100 mg total) by mouth daily. 90 tablet 0  . XARELTO 20 MG TABS tablet TAKE 1 TABLET DAILY WITH SUPPER 90 tablet 1  . losartan (COZAAR) 100 MG tablet Take 1 tablet (100 mg total) by mouth daily. 90 tablet 0   No facility-administered medications prior to visit.    No Known Allergies  ROS As per HPI  PE: Blood pressure 160/115, pulse 77, temperature 97.6 F (36.4 C), temperature source Oral, height 5\' 11"  (1.803 m), weight 230 lb 12.8 oz (104.69 kg), SpO2 96 %. bp recheck 148/101 Gen: Alert, well  appearing.  Patient is oriented to person, place, time, and situation. CV: RRR, no m/r/g.   LUNGS: CTA bilat, nonlabored resps, good aeration in all lung fields. EXT: no clubbing, cyanosis, or edema.    LABS:  Lab Results  Component Value Date   TSH 2.31 02/05/2014   Lab Results  Component Value Date   WBC 5.9 02/09/2015   HGB 14.6 02/09/2015   HCT 43.2 02/09/2015   MCV 89.4 02/09/2015   PLT 170.0 02/09/2015   Lab Results  Component Value Date   CREATININE 1.29 09/20/2015   BUN 19 09/20/2015   NA 137 09/20/2015   K 4.5 09/20/2015  CL 100 09/20/2015   CO2 29 09/20/2015   Lab Results  Component Value Date   ALT 20 03/25/2015   AST 20 03/25/2015   ALKPHOS 90 03/25/2015   BILITOT 0.5 03/25/2015   Lab Results  Component Value Date   CHOL 128 09/20/2015   Lab Results  Component Value Date   HDL 43.80 09/20/2015   Lab Results  Component Value Date   LDLCALC 63 09/20/2015   Lab Results  Component Value Date   TRIG 109.0 09/20/2015   Lab Results  Component Value Date   CHOLHDL 3 09/20/2015    IMPRESSION AND PLAN:  1) HTN; uncontrolled, suspect mostly due to noncompliance with his cozaar 100mg . He'll restart this med and continue toprol xl 100mg . Continue home bp monitoring. Recheck in office in 3-4 wks.  2) DOE/PND: Hx of mild systolic dysfxn on echo [id number].  I encouraged him to make a f/u appt with his cardiologist.  An After Visit Summary was printed and given to the patient.  FOLLOW UP: Return for 3-4 wks f/u HTN.  Signed:  Crissie Sickles, MD           10/11/2015

## 2015-10-11 NOTE — Telephone Encounter (Signed)
Left message for patient to call back regarding his medication and blood pressure.  Advised he give the medication time to work.

## 2015-10-19 ENCOUNTER — Other Ambulatory Visit: Payer: Self-pay | Admitting: Family Medicine

## 2015-10-19 NOTE — Telephone Encounter (Signed)
Last refill for sitaGLIPtin (JANUVIA) 100 MG tablet was sent to [facility] on 09/06/2014, 90 tabs, 0 refillls, 1 tab qd.

## 2015-10-19 NOTE — Telephone Encounter (Signed)
Patient is requesting [place] to contact [facility] for Rx Januvia

## 2015-10-20 ENCOUNTER — Other Ambulatory Visit: Payer: Self-pay | Admitting: Family Medicine

## 2015-10-20 MED ORDER — SITAGLIPTIN PHOSPHATE 100 MG PO TABS: 100.0000 mg | ORAL_TABLET | Freq: Every day | ORAL | Status: DC

## 2015-10-20 NOTE — Addendum Note (Signed)
Addended by: Onalee Hua on: 10/20/2015 10:24 AM   Modules accepted: Orders

## 2015-10-20 NOTE — Telephone Encounter (Signed)
RF request for Januvia LOV: 09/20/15 Next ov: None Last written: 09/07/15 #90 w/ 0RF - Printed, was not sent to [facility].   Pt advised Rx has been sent to [facility].

## 2015-10-20 NOTE — Telephone Encounter (Signed)
RF request for gabapentin LOV: 10/11/15 Next ov: 11/08/15 Last written: 10/26/14 #270 w/ 3RF  Please advise. Thanks.

## 2015-10-27 DIAGNOSIS — H401112 Primary open-angle glaucoma, right eye, moderate stage: Secondary | ICD-10-CM | POA: Diagnosis not present

## 2015-10-27 LAB — CUP PACEART REMOTE DEVICE CHECK
Battery Voltage: 2.95 V
Brady Statistic RV Percent Paced: 99.93 %
Date Time Interrogation Session: 20170301165819
Implantable Lead Implant Date: 20130715
Implantable Lead Implant Date: 20130715
Implantable Lead Location: 753859
Implantable Lead Location: 753860
Implantable Lead Model: 5086
Implantable Lead Model: 5086
Lead Channel Impedance Value: 416 Ohm
Lead Channel Impedance Value: 576 Ohm
Lead Channel Setting Pacing Amplitude: 2.5 V
Lead Channel Setting Pacing Pulse Width: 0.4 ms
Lead Channel Setting Sensing Sensitivity: 0.9 mV

## 2015-11-02 ENCOUNTER — Encounter: Payer: Self-pay | Admitting: Cardiology

## 2015-11-08 ENCOUNTER — Ambulatory Visit (INDEPENDENT_AMBULATORY_CARE_PROVIDER_SITE_OTHER): Payer: Medicare Other | Admitting: Family Medicine

## 2015-11-08 ENCOUNTER — Encounter: Payer: Self-pay | Admitting: Family Medicine

## 2015-11-08 VITALS — BP 138/100 | HR 78 | Temp 98.1°F | Resp 16 | Ht 71.0 in | Wt 227.8 lb

## 2015-11-08 DIAGNOSIS — I10 Essential (primary) hypertension: Secondary | ICD-10-CM

## 2015-11-08 MED ORDER — METOPROLOL SUCCINATE ER 100 MG PO TB24: ORAL_TABLET | ORAL | Status: DC

## 2015-11-08 NOTE — Progress Notes (Signed)
OFFICE VISIT  11/08/2015   CC:  Chief Complaint  Mason [name] presents with  . Follow-up    HTN. Pt is fasting.    HPI:    Mason [name] is a 75 y.o. Caucasian male who presents for 1 mo f/u poorly controlled HTN. Last visit he had inadvertently been off his losartan 100 mg so we got him to restart this med and monitor bp at home. He did not take his bp med yet today.   He has been compliant with toprol xl 100 mg qd and losartan 100 mg qd. No HAs,no CP, no palpitations, no dizziness.  Past Medical History  Diagnosis Date  . Chronic low back pain   . Hypertension   . Diabetes mellitus   . Depression   . Shortness of breath   . 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 GERD (gastroesophageal reflux disease)   . Arthritis   . Permanent atrial fibrillation ([place])     DCCV 07/09/13-converted, lasted two days, then back into afib--needs lifetime anticoagulation (Xarelto as of 09/2014)  . Left ventricular dysfunction 05/31/12    EF 40-45%, LA mod-severe dilated, AFIB  . History of cardiovascular stress test 05/28/12    no ischemia, EF 37%, imaging results are unchanged and within normal variance  . H/O tilt table evaluation 11/02/05    negative  . BPH (benign prostatic hypertrophy) 06/2011    Irritative sx's; pt declined trial of anticholinergic per Urology records  . History of vertigo     + Hx of posterior HA's.  Neuro (Dr. Erling Cruz) eval 2011.  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  [name] History of retinal detachment     OS  . History of chronic prostatitis   . Lumbar spondylosis     lumbosacral radiculopathy at L4 by EMG testing, right foot drop (neurologist is Dr. Linus Salmons with [facility] in [place])--neurologist referred him to neurosurgery  . Nephrolithiasis 07/2012    Left UVJ 2 mm stone with dilation of renal collecting system and slight  hydroureter on right  . Episodic low back pain 01/22/2013    w/intermittent radiculitis (12/2014 his neurologist referred him to pain mgmt for epidural steroid injection)  . Chronic renal insufficiency, stage III (moderate) 2015    CrCl about 60 ml/min  . History of adenomatous polyp of colon 10/12/11    Dr. [name] (3 right side of colon- tubular adenomas removed)    Past Surgical History  Procedure Laterality Date  . Eye surgery      retinal detachment OS+ cataract surgery  . Lumbar laminectomy  1976    left L4-5  . Insert / replace / remove pacemaker  02/05/2012    dual chamber, sinus node dysfunction, sinus arrest, PAF, Medtronic Revo serial#[id number] H: last checked 05/2015  . Cardioversion  07/09/2012    Procedure: CARDIOVERSION;  Surgeon: [name], MD;  Location[facility] ENDOSCOPY;  Service: Cardiovascular;  Laterality: N/A;  . Esophagogastroduodenoscopy  10/18/06    Done due to chronic GERD: Normal, bx showed no barrett's esophagus (Dr. [name])  . Colonoscopy w/ polypectomy  approx 2006; repeated 09/2011    Polyps on 2013 EGD as well, repeat 12/2014  . Transthoracic echocardiogram  08/25/10; 05/2012    mild asymmetric LVH, normal systolic function, normal diastolic fxn, mild-to-mod mitral regurg, mild aortic valve sclerosis and trace AI, mild aortic root dilatation. 2014 f/u showed EF 40-45%, mod LAE, A  FIB  . Cardiovascular stress test  2012    nuclear perfusion study: low risk scan  . Permanent pacemaker insertion N/A 02/05/2012    Procedure: PERMANENT PACEMAKER INSERTION;  Surgeon: [name], MD;  Location: [place] CATH LAB;  Service: Cardiovascular;  Laterality: N/A;    Outpatient Prescriptions Prior to Visit  Medication Sig Dispense Refill  . colchicine 0.6 MG tablet Take 1 tablet (0.6 mg total) by mouth daily. 15 tablet 0  . gabapentin (NEURONTIN) 300 MG capsule TAKE 1 CAPSULE THREE TIMES A DAY 270 capsule 3  . glipiZIDE (GLIPIZIDE XL) 5 MG 24 hr tablet Take 1 tablet (5 mg total)  by mouth every morning. 90 tablet 1  . glucose blood test strip Use as instructed 100 each 3  . Lancets (PRECISION ULTRA LANCET) MISC Use as instructed 100 each 3  . latanoprost (XALATAN) 0.005 % ophthalmic solution Place 1 drop into both eyes at bedtime. Instill 1 drop in each eye at bedtime  12  . losartan (COZAAR) 100 MG tablet Take 1 tablet (100 mg total) by mouth daily. 90 tablet 0  . meclizine (ANTIVERT) 25 MG tablet Take 1 tablet (25 mg total) by mouth 3 (three) times daily as needed. For vertigo. 270 tablet 0  . metFORMIN (GLUCOPHAGE) 1000 MG tablet Take 1 tablet (1,000 mg total) by mouth 2 (two) times daily. 180 tablet 1  . NEXIUM 40 MG capsule TAKE 1 CAPSULE TWICE A DAY 180 capsule 1  . oxyCODONE-acetaminophen (PERCOCET) 5-325 MG tablet Take 1-2 tablets by mouth every 4 (four) hours as needed. 20 tablet 0  . rOPINIRole (REQUIP) 1 MG tablet TAKE 1 TABLET DAILY (NEED OFFICE VISIT FOR MORE REFILLS) 90 tablet 1  . sertraline (ZOLOFT) 100 MG tablet TAKE 1 TABLET DAILY 90 tablet 0  . simvastatin (ZOCOR) 20 MG tablet Take 1 tablet (20 mg total) by mouth every evening. 90 tablet 1  . sitaGLIPtin (JANUVIA) 100 MG tablet Take 1 tablet (100 mg total) by mouth daily. 90 tablet 1  . XARELTO 20 MG TABS tablet TAKE 1 TABLET DAILY WITH SUPPER 90 tablet 1  . metoprolol succinate (TOPROL-XL) 100 MG 24 hr tablet Take 1 tablet (100 mg total) by mouth daily. Take with or immediately following a meal. 30 tablet 3  . glipiZIDE (GLUCOTROL XL) 5 MG 24 hr tablet Take 5 mg by mouth daily with breakfast. Reported on 11/08/2015     No facility-administered medications prior to visit.    No Known Allergies  ROS As per HPI  PE: Blood pressure 138/100, pulse 78, temperature 98.1 F (36.7 C), temperature source Oral, resp. rate 16, height 5\' 11"  (1.803 m), weight 227 lb 12 oz (103.307 kg), SpO2 94 %. Gen: Alert, well appearing.  Mason [name] is oriented to person, place, time, and situation. CV: RRR, no m/r/g.    LUNGS: CTA bilat, nonlabored resps, good aeration in all lung fields.   LABS:    Chemistry      Component Value Date/Time   NA 137 09/20/2015 0946   K 4.5 09/20/2015 0946   CL 100 09/20/2015 0946   CO2 29 09/20/2015 0946   BUN 19 09/20/2015 0946   CREATININE 1.29 09/20/2015 0946      Component Value Date/Time   CALCIUM 9.8 09/20/2015 0946   ALKPHOS 90 03/25/2015 1042   AST 20 03/25/2015 1042   ALT 20 03/25/2015 1042   BILITOT 0.5 03/25/2015 1042      IMPRESSION AND PLAN:  1) Essential HTN, not  ideal control. Increase toprol xl to 1 and 1/2 of the 100 mg tabs. Continue losartan 100mg  qd.  An After Visit Summary was printed and given to the Mason [name].  FOLLOW UP: Return in about 1 month (around 12/08/2015) for routine chronic illness f/u (30 min).  Signed:  Crissie Sickles, MD           11/08/2015

## 2015-11-08 NOTE — Progress Notes (Signed)
Pre visit review using our clinic review tool, if applicable. No additional management support is needed unless otherwise documented below in the visit note. 

## 2015-12-03 ENCOUNTER — Other Ambulatory Visit: Payer: Self-pay | Admitting: Family Medicine

## 2015-12-03 NOTE — Telephone Encounter (Signed)
RF request for ropinirole LOV: 11/08/15 Next ov: 12/09/15 Last written: 06/08/15 #90 w/ 1RF

## 2015-12-08 ENCOUNTER — Encounter: Payer: Self-pay | Admitting: Family Medicine

## 2015-12-08 DIAGNOSIS — H401123 Primary open-angle glaucoma, left eye, severe stage: Secondary | ICD-10-CM | POA: Diagnosis not present

## 2015-12-08 DIAGNOSIS — H401112 Primary open-angle glaucoma, right eye, moderate stage: Secondary | ICD-10-CM | POA: Diagnosis not present

## 2015-12-09 ENCOUNTER — Ambulatory Visit: Payer: Medicare Other | Admitting: Family Medicine

## 2015-12-09 ENCOUNTER — Ambulatory Visit (INDEPENDENT_AMBULATORY_CARE_PROVIDER_SITE_OTHER): Payer: Medicare Other | Admitting: Family Medicine

## 2015-12-09 ENCOUNTER — Encounter: Payer: Self-pay | Admitting: Family Medicine

## 2015-12-09 VITALS — BP 135/92 | HR 76 | Temp 98.0°F | Resp 16 | Ht 71.0 in | Wt 223.2 lb

## 2015-12-09 DIAGNOSIS — I10 Essential (primary) hypertension: Secondary | ICD-10-CM | POA: Diagnosis not present

## 2015-12-09 DIAGNOSIS — E785 Hyperlipidemia, unspecified: Secondary | ICD-10-CM | POA: Diagnosis not present

## 2015-12-09 DIAGNOSIS — L309 Dermatitis, unspecified: Secondary | ICD-10-CM

## 2015-12-09 DIAGNOSIS — E118 Type 2 diabetes mellitus with unspecified complications: Secondary | ICD-10-CM | POA: Diagnosis not present

## 2015-12-09 DIAGNOSIS — N182 Chronic kidney disease, stage 2 (mild): Secondary | ICD-10-CM

## 2015-12-09 DIAGNOSIS — N2889 Other specified disorders of kidney and ureter: Secondary | ICD-10-CM

## 2015-12-09 DIAGNOSIS — N189 Chronic kidney disease, unspecified: Secondary | ICD-10-CM

## 2015-12-09 MED ORDER — METOPROLOL SUCCINATE ER 100 MG PO TB24: ORAL_TABLET | ORAL | Status: DC

## 2015-12-09 MED ORDER — FLUTICASONE PROPIONATE 0.05 % EX CREA: TOPICAL_CREAM | Freq: Two times a day (BID) | CUTANEOUS | Status: DC

## 2015-12-09 NOTE — Progress Notes (Signed)
Pre visit review using our clinic review tool, if applicable. No additional management support is needed unless otherwise documented below in the visit note. 

## 2015-12-09 NOTE — Progress Notes (Signed)
OFFICE VISIT  12/09/2015   CC:  Chief Complaint  Patient presents with  . Follow-up     HPI:    Patient is a 75 y.o. Caucasian male who presents for 3 mo f/u DM 2, HTN, CRI II, and HLD.  Has had itchy rash on R thigh for about 2 wks.  He recalls a tick being at the site prior to onset of the rash. He has applied otc itch cream and it helps briefly.  Triple antibiotic cream does the same thing.  Home bp's 130s/80s.  He can recall only one glucose reading: 113.  Takes simvastatin daily w/out problem. Diet: he characterizes it as poor.  ROS: no fever, no body aches, no headaches, no n/v/d  Past Medical History  Diagnosis Date  . Chronic low back pain   . Hypertension   . Diabetes mellitus   . Depression   . Shortness of breath   . 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 GERD (gastroesophageal reflux disease)   . Arthritis   . Permanent atrial fibrillation ([place])     DCCV 07/09/13-converted, lasted two days, then back into afib--needs lifetime anticoagulation (Xarelto as of 09/2014)  . Left ventricular dysfunction 05/31/12    EF 40-45%, LA mod-severe dilated, AFIB  . History of cardiovascular stress test 05/28/12    no ischemia, EF 37%, imaging results are unchanged and within normal variance  . H/O tilt table evaluation 11/02/05    negative  . BPH (benign prostatic hypertrophy) 06/2011    Irritative sx's; pt declined trial of anticholinergic per Urology records  . History of vertigo     + Hx of posterior HA's.  Neuro (Dr. Erling Cruz) eval 2011.  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  [name] History of retinal detachment     OS  . History of chronic prostatitis   . Lumbar spondylosis     lumbosacral radiculopathy at L4 by EMG testing, right foot drop (neurologist is Dr. Linus Salmons with [facility] in [place])--neurologist referred him  to neurosurgery  . Nephrolithiasis 07/2012    Left UVJ 2 mm stone with dilation of renal collecting system and slight hydroureter on right  . Episodic low back pain 01/22/2013    w/intermittent radiculitis (12/2014 his neurologist referred him to pain mgmt for epidural steroid injection)  . Chronic renal insufficiency, stage III (moderate) 2015    CrCl about 60 ml/min  . History of adenomatous polyp of colon 10/12/11    Dr. [name] (3 right side of colon- tubular adenomas removed)    Past Surgical History  Procedure Laterality Date  . Eye surgery      retinal detachment OS+ cataract surgery  . Lumbar laminectomy  1976    left L4-5  . Insert / replace / remove pacemaker  02/05/2012    dual chamber, sinus node dysfunction, sinus arrest, PAF, Medtronic Revo serial#[id number] H: last checked 05/2015  . Cardioversion  07/09/2012    Procedure: CARDIOVERSION;  Surgeon: [name], MD;  Location: [facility] ENDOSCOPY;  Service: Cardiovascular;  Laterality: N/A;  . Esophagogastroduodenoscopy  10/18/06    Done due to chronic GERD: Normal, bx showed no barrett's esophagus (Dr. [name])  . Colonoscopy w/ polypectomy  approx 2006; repeated 09/2011    Polyps on 2013 EGD as well, repeat 12/2014  . Transthoracic echocardiogram  08/25/10; 05/2012    mild asymmetric LVH, normal systolic function, normal diastolic fxn,  mild-to-mod mitral regurg, mild aortic valve sclerosis and trace AI, mild aortic root dilatation. 2014 f/u showed EF 40-45%, mod LAE, A FIB  . Cardiovascular stress test  2012    nuclear perfusion study: low risk scan  . Permanent pacemaker insertion N/A 02/05/2012    Procedure: PERMANENT PACEMAKER INSERTION;  Surgeon: [name], MD;  Location: [place] CATH LAB;  Service: Cardiovascular;  Laterality: N/A;    Outpatient Prescriptions Prior to Visit  Medication Sig Dispense Refill  . colchicine 0.6 MG tablet Take 1 tablet (0.6 mg total) by mouth daily. 15 tablet 0  . gabapentin (NEURONTIN) 300 MG capsule  TAKE 1 CAPSULE THREE TIMES A DAY 270 capsule 3  . glipiZIDE (GLIPIZIDE XL) 5 MG 24 hr tablet Take 1 tablet (5 mg total) by mouth every morning. 90 tablet 1  . glucose blood test strip Use as instructed 100 each 3  . Lancets (PRECISION ULTRA LANCET) MISC Use as instructed 100 each 3  . latanoprost (XALATAN) 0.005 % ophthalmic solution Place 1 drop into both eyes at bedtime. Instill 1 drop in each eye at bedtime  12  . losartan (COZAAR) 100 MG tablet Take 1 tablet (100 mg total) by mouth daily. 90 tablet 0  . meclizine (ANTIVERT) 25 MG tablet Take 1 tablet (25 mg total) by mouth 3 (three) times daily as needed. For vertigo. 270 tablet 0  . metFORMIN (GLUCOPHAGE) 1000 MG tablet Take 1 tablet (1,000 mg total) by mouth 2 (two) times daily. 180 tablet 1  . NEXIUM 40 MG capsule TAKE 1 CAPSULE TWICE A DAY 180 capsule 1  . oxyCODONE-acetaminophen (PERCOCET) 5-325 MG tablet Take 1-2 tablets by mouth every 4 (four) hours as needed. 20 tablet 0  . rOPINIRole (REQUIP) 1 MG tablet TAKE 1 TABLET DAILY (NEED OFFICE VISIT FOR MORE REFILLS) 90 tablet 1  . sertraline (ZOLOFT) 100 MG tablet TAKE 1 TABLET DAILY 90 tablet 0  . simvastatin (ZOCOR) 20 MG tablet Take 1 tablet (20 mg total) by mouth every evening. 90 tablet 1  . sitaGLIPtin (JANUVIA) 100 MG tablet Take 1 tablet (100 mg total) by mouth daily. 90 tablet 1  . XARELTO 20 MG TABS tablet TAKE 1 TABLET DAILY WITH SUPPER 90 tablet 1  . metoprolol succinate (TOPROL-XL) 100 MG 24 hr tablet 1 and 1/2 tabs po qd 45 tablet 3   No facility-administered medications prior to visit.    No Known Allergies  ROS As per HPI  PE: Blood pressure 135/92, pulse 76, temperature 98 F (36.7 C), temperature source Oral, resp. rate 16, height 5\' 11"  (1.803 m), weight 223 lb 4 oz (101.266 kg), SpO2 96 %. Gen: Alert, well appearing.  Patient is oriented to person, place, time, and situation. CV: RRR, no m/r/g.   LUNGS: CTA bilat, nonlabored resps, good aeration in all lung  fields. EXT: no clubbing, cyanosis, or edema.  SKIN: Right thigh lateral aspect with a faint smattering of tiny flesh colored papules.  No erythema, no vesicles, no pustules, no petechiae, no warmth, no tenderness.  LABS:  Lab Results  Component Value Date   TSH 2.31 02/05/2014   Lab Results  Component Value Date   WBC 5.9 02/09/2015   HGB 14.6 02/09/2015   HCT 43.2 02/09/2015   MCV 89.4 02/09/2015   PLT 170.0 02/09/2015   Lab Results  Component Value Date   CREATININE 1.29 09/20/2015   BUN 19 09/20/2015   NA 137 09/20/2015   K 4.5 09/20/2015   CL 100  09/20/2015   CO2 29 09/20/2015   Lab Results  Component Value Date   ALT 20 03/25/2015   AST 20 03/25/2015   ALKPHOS 90 03/25/2015   BILITOT 0.5 03/25/2015   Lab Results  Component Value Date   CHOL 128 09/20/2015   Lab Results  Component Value Date   HDL 43.80 09/20/2015   Lab Results  Component Value Date   LDLCALC 63 09/20/2015   Lab Results  Component Value Date   TRIG 109.0 09/20/2015   Lab Results  Component Value Date   CHOLHDL 3 09/20/2015   Lab Results  Component Value Date   HGBA1C 7.4* 09/20/2015   IMPRESSION AND PLAN:  1) Nonspecific dermatitis R thigh: cutivate 0.05% cream rx'd to apply bid prn.  2) DM 2; noncompliant with diet and monitoring (for the most part). Check HbA1c today.  3) HTN; The current medical regimen is effective;  continue present plan and medications. BMET today.  4) HLD: tolerating statin.  Lipids good 3 months ago.  Will check routine AST/ALT at next f/u visit.  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  6) Preventative health care: gave pt rx for zostavax today.  An After Visit Summary was printed and given to the patient.  FOLLOW UP: Return in about 3 months (around 03/10/2016) for routine chronic illness f/u.  Signed:  Crissie Sickles, MD           12/09/2015

## 2015-12-10 LAB — BASIC METABOLIC PANEL
BUN: 24 mg/dL — ABNORMAL HIGH (ref 6–23)
CO2: 27 mEq/L (ref 19–32)
Calcium: 9.6 mg/dL (ref 8.4–10.5)
Chloride: 106 mEq/L (ref 96–112)
Creatinine, Ser: 1.21 mg/dL (ref 0.40–1.50)
GFR: 62.41 mL/min (ref >60.00)
Glucose, Bld: 105 mg/dL — ABNORMAL HIGH (ref 70–99)
Potassium: 4.8 mEq/L (ref 3.5–5.1)
Sodium: 140 mEq/L (ref 135–145)

## 2015-12-10 LAB — HEMOGLOBIN A1C: Hgb A1c MFr Bld: 7 % — ABNORMAL HIGH (ref 4.6–6.5)

## 2015-12-14 ENCOUNTER — Telehealth: Payer: Self-pay | Admitting: *Deleted

## 2015-12-14 MED ORDER — OMEPRAZOLE 40 MG PO CPDR: 40.0000 mg | DELAYED_RELEASE_CAPSULE | Freq: Every day | ORAL | Status: DC

## 2015-12-14 NOTE — Telephone Encounter (Signed)
Pt LMOM on 12/14/15 at 10:44am stating that his insurance will no longer cover the Nexium and he requesting he change to a generic medication. He stated that he has took omeprazole in the past and this is on the list of covered medications. He is requesting new Rx for omeprazole be sent to [facility]. Please advise. Thanks.

## 2015-12-14 NOTE — Telephone Encounter (Signed)
Pts wife advised and voiced understanding, okay per DPR. 

## 2015-12-14 NOTE — Telephone Encounter (Signed)
Omeprazole eRx'd to [facility].

## 2015-12-17 ENCOUNTER — Telehealth: Payer: Self-pay | Admitting: Cardiovascular Disease
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
## 2015-12-17 NOTE — Telephone Encounter (Signed)
Noted  

## 2015-12-17 NOTE — Telephone Encounter (Signed)
Spoke w/ pt and determined that his wirex adapter did not have adequate cellular service to send a remote transmission. Pt is going to move his monitor and wirex around his home in attempt to get a least 2 bars of cellular signal on his wirex adapter. Once he does he is going to call me back.

## 2015-12-17 NOTE — Telephone Encounter (Signed)
FORWARD TO DEVICE CLINIC TO SEE IF THEY CAN BE ASSISTANCE

## 2015-12-17 NOTE — Telephone Encounter (Signed)
FORWARD TO DEVICE CLINC 

## 2015-12-17 NOTE — Telephone Encounter (Signed)
Spoke with patient, advised him to change the batteries in his 2490 monitor and to hook it up to his land line phone.  Patient was able to send a transmission.  PPM function stable, no alerts or abnormalities noted.  Patient aware.  He continues to decline to proceed to ED and states that he will call his PCP if his symptoms persist.  Patient is appreciative of assistance and denies additional questions or concerns at this time.
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
## 2015-12-17 NOTE — Telephone Encounter (Signed)
Follow-up      1. Has your device fired? no  2. Is you device beeping? no  3. Are you experiencing draining or swelling at device site? no  4. Are you calling to see if we received your device transmission? yes  5. Have you passed out? No   The pt had followed the instructions in the booklet and does not know if the signal went through. The pt wants to make sure the signal went through.

## 2015-12-22 ENCOUNTER — Ambulatory Visit (INDEPENDENT_AMBULATORY_CARE_PROVIDER_SITE_OTHER): Payer: Medicare Other | Admitting: *Deleted

## 2015-12-22 ENCOUNTER — Telehealth: Payer: Self-pay | Admitting: Cardiology

## 2015-12-22 DIAGNOSIS — I495 Sick sinus syndrome: Secondary | ICD-10-CM

## 2015-12-22 NOTE — Telephone Encounter (Signed)
LMOVM reminding pt to send remote transmission.   

## 2015-12-23 NOTE — Progress Notes (Signed)
Remote pacemaker transmission.   

## 2015-12-27 ENCOUNTER — Other Ambulatory Visit: Payer: Self-pay | Admitting: Family Medicine

## 2016-01-22 DIAGNOSIS — B9681 Helicobacter pylori [H. pylori] as the cause of diseases classified elsewhere: Secondary | ICD-10-CM

## 2016-01-22 HISTORY — DX: Helicobacter pylori (H. pylori) as the cause of diseases classified elsewhere: B96.81

## 2016-02-21 ENCOUNTER — Telehealth: Payer: Self-pay | Admitting: Family Medicine

## 2016-02-21 ENCOUNTER — Encounter: Payer: Self-pay | Admitting: Family Medicine

## 2016-02-21 ENCOUNTER — Ambulatory Visit (INDEPENDENT_AMBULATORY_CARE_PROVIDER_SITE_OTHER): Payer: Medicare Other | Admitting: Family Medicine

## 2016-02-21 VITALS — BP 149/93 | HR 71 | Temp 97.8°F | Resp 20 | Wt 230.0 lb

## 2016-02-21 DIAGNOSIS — K625 Hemorrhage of anus and rectum: Secondary | ICD-10-CM | POA: Diagnosis not present

## 2016-02-21 DIAGNOSIS — R1013 Epigastric pain: Secondary | ICD-10-CM | POA: Diagnosis not present

## 2016-02-21 LAB — COMPREHENSIVE METABOLIC PANEL
ALT: 17 U/L (ref 9–46)
AST: 17 U/L (ref 10–35)
Albumin: 4.3 g/dL (ref 3.6–5.1)
Alkaline Phosphatase: 78 U/L (ref 40–115)
BUN: 21 mg/dL (ref 7–25)
CO2: 25 mmol/L (ref 20–31)
Calcium: 9.2 mg/dL (ref 8.6–10.3)
Chloride: 104 mmol/L (ref 98–110)
Creat: 1.08 mg/dL (ref 0.70–1.18)
Glucose, Bld: 152 mg/dL — ABNORMAL HIGH (ref 65–99)
Potassium: 4.7 mmol/L (ref 3.5–5.3)
Sodium: 138 mmol/L (ref 135–146)
Total Bilirubin: 0.7 mg/dL (ref 0.2–1.2)
Total Protein: 7.1 g/dL (ref 6.1–8.1)

## 2016-02-21 LAB — CUP PACEART REMOTE DEVICE CHECK
Battery Voltage: 2.95 V
Brady Statistic RV Percent Paced: 99.9 %
Date Time Interrogation Session: 20170601024922
Implantable Lead Implant Date: 20130715
Implantable Lead Implant Date: 20130715
Implantable Lead Location: 753859
Implantable Lead Location: 753860
Implantable Lead Model: 5086
Implantable Lead Model: 5086
Lead Channel Impedance Value: 408 Ohm
Lead Channel Impedance Value: 576 Ohm
Lead Channel Sensing Intrinsic Amplitude: 0.429 mV
Lead Channel Setting Pacing Amplitude: 2.5 V
Lead Channel Setting Pacing Pulse Width: 0.4 ms
Lead Channel Setting Sensing Sensitivity: 0.9 mV

## 2016-02-21 LAB — LIPASE: Lipase: 35 U/L (ref 7–60)

## 2016-02-21 LAB — CBC WITH DIFFERENTIAL/PLATELET
Basophils Absolute: 0 cells/uL (ref 0–200)
Basophils Relative: 0 %
Eosinophils Absolute: 150 cells/uL (ref 15–500)
Eosinophils Relative: 3 %
HCT: 42.8 % (ref 38.5–50.0)
Hemoglobin: 14.1 g/dL (ref 13.2–17.1)
Lymphocytes Relative: 33 %
Lymphs Abs: 1650 cells/uL (ref 850–3900)
MCH: 29.7 pg (ref 27.0–33.0)
MCHC: 32.9 g/dL (ref 32.0–36.0)
MCV: 90.1 fL (ref 80.0–100.0)
MPV: 10.4 fL (ref 7.5–12.5)
Monocytes Absolute: 400 cells/uL (ref 200–950)
Monocytes Relative: 8 %
Neutro Abs: 2800 cells/uL (ref 1500–7800)
Neutrophils Relative %: 56 %
Platelets: 159 10*3/uL (ref 140–400)
RBC: 4.75 MIL/uL (ref 4.20–5.80)
RDW: 13.8 % (ref 11.0–15.0)
WBC: 5 10*3/uL (ref 3.8–10.8)

## 2016-02-21 NOTE — Telephone Encounter (Signed)
Patient Name: Mason [name] DOB: 01/30/1941 Initial Comment Caller says, has a dull pain which goes across the bottom of his rib cage for a week, he also has a Psychologist, forensic. His stool is also real dark. Also since last night he woke up twice trying to breath. His breathing is fine now. But he does feel like he has shortness of breath often. Nurse Assessment Nurse: Ronnald Ramp, RN, Miranda Date/Time (Eastern Time): 02/21/2016 12:06:43 PM Confirm and document reason for call. If symptomatic, describe symptoms. You must click the next button to save text entered. ---Caller states he has been having pain in his upper abdomen for about 1 week. Stools have been black. He has also had SOB off and on. Has the patient traveled out of the country within the last 30 days? ---No Does the patient have any new or worsening symptoms? ---Yes Will a triage be completed? ---Yes Related visit to physician within the last 2 weeks? ---No Does the PT have any chronic conditions? (i.e. diabetes, asthma, etc.) ---Yes List chronic conditions. ---Diabetes, Pacemaker, HTN, Neuropathy Is this a behavioral health or substance abuse call? ---No Guidelines Guideline Title Affirmed Question Affirmed Notes Abdominal Pain - Upper [1] MODERATE pain (e.g., interferes with normal activities) AND [2] comes and goes (cramps) AND [3] present > 24 hours (Exception: pain with Vomiting or Diarrhea - see that Guideline) Final Disposition User See Physician within 24 Hours Ronnald Ramp, RN, Miranda Comments No appt with PCP today, appt scheduled with Dr. Howard Pouch at 3:15p Referrals REFERRED TO PCP OFFICE Disagree/Comply: Comply

## 2016-02-21 NOTE — Patient Instructions (Signed)
Labs collected today. Results will be called to pt and plan discussed at that time

## 2016-02-21 NOTE — Progress Notes (Signed)
Mason [name] , 23-Apr-1941, 75 y.o., male MRN: [id number] Patient Care Team    Relationship Specialty Notifications Start End  Tammi Sou, MD PCP - General Family Medicine  01/03/13    Comment: Gwendolyn Grant, MD Consulting Physician Gastroenterology  02/14/13   Kathie Rhodes, MD Consulting Physician Urology  02/14/13   Troy Sine, MD Consulting Physician Cardiology  02/14/13    Comment: also Dr. Orene Desanctis with [facility]  Audery Amel, MD Referring Physician Neurology  06/11/13   Clent Jacks, MD Consulting Physician Ophthalmology  02/27/14   [name], MD Consulting Physician Cardiology  07/05/14   Audery Amel, MD Consulting Physician Neurology  01/16/15     CC: abd pain Subjective: Pt presents for an acute OV with complaints of  of abd pain for 5 days.   Location: epigastric area. He states pain  is intermittent and dull ache feeling. He has had GERD  in the past and on omeprazole 40 mg dose and takes daily. He does not feel stomach pain is better/worse meals. He endorses increased gas both  Belching and flatus. He denies use of NSAIDS. He is prescribed xarelto. He denies fever, chills, chest pain, nausea, vomit, dizziness,  Lower ext edema, constipation or diarrhea. He endorses "dark" colored stools and blood on his toilet paper over the weekend. He states during the night he woke up twice and felt he could not catch his breath, but was able to easily do so once awake. If feels he is more winded with walking short distances, just mildly, and he is uncertain if this is related to his epigastric pain. He denies bleeding gums or epistaxis. He endorses social alcohol consumption.  04/13/2015 colonoscopy--> polyp tubular adenoma, Dr. [name]. 2013: EGD: negative bx, neg Barrett's  05/2012 echo: mild asymmetric LVH, normal systolic function, normal diastolic fxn, mild-to-mod mitral regurg, mild aortic valve sclerosis and trace AI, mild aortic root dilatation. 2014 f/u showed EF  40-45%, mod LAE, A FIB  No Known Allergies Social History  Substance Use Topics  . Smoking status: Never Smoker  . Smokeless tobacco: Never Used  . Alcohol use 7.2 oz/week    12 Cans of beer per week     Comment: 12 beers per week   Past Medical History:  Diagnosis Date  . Arthritis   . BPH (benign prostatic hypertrophy) 06/2011   Irritative sx's; pt declined trial of anticholinergic per Urology records  . Chronic low back pain   . Chronic renal insufficiency, stage III (moderate) 2015   CrCl about 60 ml/min  . Depression   . Diabetes mellitus   . Episodic low back pain 01/22/2013   w/intermittent radiculitis (12/2014 his neurologist referred him to pain mgmt for epidural steroid injection)  . GERD (gastroesophageal reflux disease)   . H/O tilt table evaluation 11/02/05   negative  . History of adenomatous polyp of colon 10/12/11   Dr. [name] (3 right side of colon- tubular adenomas removed)  . History of cardiovascular stress test 05/28/12   no ischemia, EF 37%, imaging results are unchanged and within normal variance  . History of chronic prostatitis   . History of retinal detachment    OS  . History of vertigo    + Hx of posterior HA's.  Neuro (Dr. Erling Cruz) eval 2011.  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  . Hypertension   . Left ventricular dysfunction 05/31/12   EF  40-45%, LA mod-severe dilated, AFIB  . Lumbar spondylosis    lumbosacral radiculopathy at L4 by EMG testing, right foot drop (neurologist is Dr. Linus Salmons with [facility] in [place])--neurologist referred him to neurosurgery  . Nephrolithiasis 07/2012   Left UVJ 2 mm stone with dilation of renal collecting system and slight hydroureter on right  . 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 Permanent atrial fibrillation ([place])    DCCV 07/09/13-converted, lasted two days,  then back into afib--needs lifetime anticoagulation (Xarelto as of 09/2014)  . Shortness of breath    Past Surgical History:  Procedure Laterality Date  . CARDIOVASCULAR STRESS TEST  2012   nuclear perfusion study: low risk scan  . CARDIOVERSION  07/09/2012   Procedure: CARDIOVERSION;  Surgeon: [name], MD;  Location: [facility] ENDOSCOPY;  Service: Cardiovascular;  Laterality: N/A;  . COLONOSCOPY W/ POLYPECTOMY  approx 2006; repeated 09/2011   Polyps on 2013 EGD as well, repeat 12/2014  . ESOPHAGOGASTRODUODENOSCOPY  10/18/06   Done due to chronic GERD: Normal, bx showed no barrett's esophagus (Dr. [name])  . EYE SURGERY     retinal detachment OS+ cataract surgery  . INSERT / REPLACE / REMOVE PACEMAKER  02/05/2012   dual chamber, sinus node dysfunction, sinus arrest, PAF, Medtronic Revo serial#-[id number] H: last checked 05/2015  . LUMBAR LAMINECTOMY  1976   left L4-5  . PERMANENT PACEMAKER INSERTION N/A 02/05/2012   Procedure: PERMANENT PACEMAKER INSERTION;  Surgeon: [name], MD;  Location: [facility] CATH LAB;  Service: Cardiovascular;  Laterality: N/A;  . TRANSTHORACIC ECHOCARDIOGRAM  08/25/10; 05/2012   mild asymmetric LVH, normal systolic function, normal diastolic fxn, mild-to-mod mitral regurg, mild aortic valve sclerosis and trace AI, mild aortic root dilatation. 2014 f/u showed EF 40-45%, mod LAE, A FIB   Family History  Problem Relation Age of Onset  . Heart failure Mother   . Stroke Mother   . Stroke Father      Medication List       Accurate as of 02/21/16  4:01 PM. Always use your most recent med list.          colchicine 0.6 MG tablet Take 1 tablet (0.6 mg total) by mouth daily.   fluticasone 0.05 % cream Commonly known as:  CUTIVATE Apply topically 2 (two) times daily.   gabapentin 300 MG capsule Commonly known as:  NEURONTIN TAKE 1 CAPSULE THREE TIMES A DAY   glipiZIDE 5 MG 24 hr tablet Commonly known as:  GLIPIZIDE XL Take 1 tablet (5 mg total) by mouth every  morning.   glucose blood test strip Use as instructed   latanoprost 0.005 % ophthalmic solution Commonly known as:  XALATAN Place 1 drop into both eyes at bedtime. Instill 1 drop in each eye at bedtime   losartan 100 MG tablet Commonly known as:  COZAAR Take 1 tablet (100 mg total) by mouth daily.   meclizine 25 MG tablet Commonly known as:  ANTIVERT Take 1 tablet (25 mg total) by mouth 3 (three) times daily as needed. For vertigo.   metFORMIN 1000 MG tablet Commonly known as:  GLUCOPHAGE Take 1 tablet (1,000 mg total) by mouth 2 (two) times daily.   metoprolol succinate 100 MG 24 hr tablet Commonly known as:  TOPROL-XL 1 and 1/2 tabs po qd   omeprazole 40 MG capsule Commonly known as:  PRILOSEC Take 1 capsule (40 mg total) by mouth daily.   oxyCODONE-acetaminophen 5-325 MG tablet Commonly  known as:  PERCOCET Take 1-2 tablets by mouth every 4 (four) hours as needed.   PRECISION ULTRA LANCET Misc Use as instructed   rOPINIRole 1 MG tablet Commonly known as:  REQUIP TAKE 1 TABLET DAILY (NEED OFFICE VISIT FOR MORE REFILLS)   sertraline 100 MG tablet Commonly known as:  ZOLOFT TAKE 1 TABLET DAILY   simvastatin 20 MG tablet Commonly known as:  ZOCOR Take 1 tablet (20 mg total) by mouth every evening.   sitaGLIPtin 100 MG tablet Commonly known as:  JANUVIA Take 1 tablet (100 mg total) by mouth daily.   XARELTO 20 MG Tabs tablet Generic drug:  rivaroxaban TAKE 1 TABLET DAILY WITH SUPPER       No results found for this or any previous visit (from the past 24 hour(s)). No results found.   ROS: Negative, with the exception of above mentioned in HPI   Objective:  BP (!) 149/93 (BP Location: Right Arm, Patient Position: Sitting, Cuff Size: Large)   Pulse 71   Temp 97.8 F (36.6 C) (Oral)   Resp 20   Wt 230 lb (104.3 kg)   SpO2 96%   BMI 32.08 kg/m  Body mass index is 32.08 kg/m. Gen: Afebrile. No acute distress. Nontoxic in appearance, well developed,  well nourished. Obese  Caucasian male.  HENT: AT. [place]. MMM, no oral lesions Eyes:Pupils Equal Round Reactive to light, Extraocular movements intact,  Conjunctiva without redness, discharge or icterus. Neck/lymp/endocrine: Supple,no lymphadenopathy CV: RRR, no edema Chest: CTAB, no wheeze or crackles. Abd: Soft. obese. Mild midline epigastric tenderness. ND. BS present. no Masses palpated. No rebound or guarding.  Skin: no rashes, purpura or petechiae. No abd skin changes Neuro: Normal gait. PERLA. EOMi. Alert. Oriented x3   Assessment/Plan: LINDA BIEHN is a 75 y.o. male present for acute OV for  Rectal bleeding/abd pain: - epigastric pain. Uncertain if the gasping for air in the middle of the night is related. Pt was hesitant to discuss rectal bleeding and states it has not happened today. Agreed to FOBT home cards. If bleeding continues or he is anemic, he will need GU exam. He is agreeable.  - Lipase - CBC w/Diff - Comp Met (CMET) - H. pylori antibody, IgG - Hemoccult Cards (X3 cards); Future - pt is on xarelto, and has normal kidney function.  - avoid NSAIDS - dicussed possible upper/lower GI bleed, GERD, gastritis and history of colon polyps.  - Continue omeprazole daily (already taking 40 mg). Consider increase in therapy vs H/Pylori treatment depending on labs.  - Depending on lab results may need GI referral  - F/U 1 week.    > 25 minutes spent with patient, >50% of time spent face to face counseling patient and coordinating care.   electronically signed by:  Howard Pouch, DO  [place]

## 2016-02-22 ENCOUNTER — Telehealth: Payer: Self-pay | Admitting: Family Medicine

## 2016-02-22 ENCOUNTER — Encounter: Payer: Self-pay | Admitting: Cardiology

## 2016-02-22 ENCOUNTER — Other Ambulatory Visit: Payer: Self-pay | Admitting: *Deleted

## 2016-02-22 LAB — H. PYLORI ANTIBODY, IGG: H Pylori IgG: POSITIVE — AB

## 2016-02-22 MED ORDER — BIS SUBCIT-METRONID-TETRACYC 140-125-125 MG PO CAPS: 3.0000 | ORAL_CAPSULE | Freq: Three times a day (TID) | ORAL | 0 refills | Status: DC

## 2016-02-22 NOTE — Telephone Encounter (Signed)
Spoke with patient wife reviewed results and instructions. She verbalized understanding of all instructions.

## 2016-02-22 NOTE — Telephone Encounter (Signed)
Please call pt:  - his labs are all stable from yesterday, except he is H. Pylori positive. This is a bacteria within the stomach and is treated with multiple abx course for 10 days. I have called this in for him. - if he has any additional dark stools or bloody stools, his hemoccult cards are positive (he still needs to do) or his pain worsens he needs to be seen by his GI doctor immediately.  - otherwise f/u 2-3 weeks after treatment.

## 2016-02-24 ENCOUNTER — Ambulatory Visit: Payer: Medicare Other | Admitting: Family Medicine

## 2016-02-25 ENCOUNTER — Other Ambulatory Visit: Payer: Self-pay | Admitting: Family Medicine

## 2016-02-25 NOTE — Telephone Encounter (Signed)
RF request for losartan LOV: 12/09/15 Next ov: 03/14/16 Last written: 10/11/15 #90 w/ 0RF

## 2016-02-28 ENCOUNTER — Telehealth: Payer: Self-pay | Admitting: Family Medicine

## 2016-02-28 NOTE — Telephone Encounter (Signed)
Patient stopped into office today to drop off the stool cards and patient was inquiring about the RX that was sent in for H. Pylori.  Patient states that instructions say do not take with blood thinners but he is on xarelto, okay to begin? Please advise.

## 2016-02-28 NOTE — Telephone Encounter (Signed)
Pt stopped into office with questions if it is safe to take  Omeprazole (?) with his xarelto. The studies he likely seen was omeprazole with the use of Plavix (different type of blood thinner).  Hopefully omeprazole medication will be short term for him, but if he has concerns I would suggest he also ask his cardiologist.

## 2016-02-29 NOTE — Telephone Encounter (Signed)
No major interactions are usually encountered with xarelto and plyera. I would not take them together, separate by at least 6 hours,  but should not be major issues. Monitor for increased bruising or bleeding, but again usually not an issue and he is only on for a few weeks. He could call his cardiologist as well to ask.

## 2016-02-29 NOTE — Telephone Encounter (Signed)
No, sorry.  Patient was inquiring about bismuth-metronidazole-tetracycline (PYLERA) 140-125-125 MG capsule.  Please advise.

## 2016-02-29 NOTE — Telephone Encounter (Signed)
Spoke with patient reviewed information and instructions.Patient states he will start medication as directed.

## 2016-03-01 ENCOUNTER — Telehealth: Payer: Self-pay | Admitting: Family Medicine

## 2016-03-01 ENCOUNTER — Other Ambulatory Visit: Payer: Medicare Other

## 2016-03-01 DIAGNOSIS — R1013 Epigastric pain: Secondary | ICD-10-CM

## 2016-03-01 DIAGNOSIS — K625 Hemorrhage of anus and rectum: Secondary | ICD-10-CM

## 2016-03-01 LAB — HEMOCCULT SLIDES (X 3 CARDS)
Fecal Occult Blood: NEGATIVE
OCCULT 1: NEGATIVE
OCCULT 2: NEGATIVE
OCCULT 3: NEGATIVE
OCCULT 4: NEGATIVE
OCCULT 5: NEGATIVE

## 2016-03-01 NOTE — Telephone Encounter (Signed)
Please call patient: His Hemoccult cards were all negative.

## 2016-03-02 ENCOUNTER — Encounter: Payer: Self-pay | Admitting: Family Medicine

## 2016-03-02 ENCOUNTER — Ambulatory Visit (INDEPENDENT_AMBULATORY_CARE_PROVIDER_SITE_OTHER): Payer: Medicare Other | Admitting: Family Medicine

## 2016-03-02 VITALS — BP 119/81 | HR 77 | Temp 98.2°F | Resp 16 | Ht 71.0 in | Wt 223.8 lb

## 2016-03-02 DIAGNOSIS — M7041 Prepatellar bursitis, right knee: Secondary | ICD-10-CM

## 2016-03-02 MED ORDER — PREDNISONE 20 MG PO TABS: ORAL_TABLET | ORAL | 0 refills | Status: DC

## 2016-03-02 NOTE — Progress Notes (Signed)
OFFICE VISIT  03/02/2016   CC:  Chief Complaint  Patient presents with  . Knee Pain    right x 6 days    HPI:    Patient is a 75 y.o. Caucasian male who presents for right knee pain. Onset about 6 d/a, pain over R knee cap.  No injury or excessive pressure on this are prior to onset.  No redness, no swelling noted.  He feels warmth.  Feels stiff.  Tried ice/heat, no help.  ROS: no fevers or malaise  Past Medical History:  Diagnosis Date  . Arthritis   . BPH (benign prostatic hypertrophy) 06/2011   Irritative sx's; pt declined trial of anticholinergic per Urology records  . Chronic low back pain   . Chronic renal insufficiency, stage III (moderate) 2015   CrCl about 60 ml/min  . Depression   . Diabetes mellitus   . Episodic low back pain 01/22/2013   w/intermittent radiculitis (12/2014 his neurologist referred him to pain mgmt for epidural steroid injection)  . GERD (gastroesophageal reflux disease)   . H/O tilt table evaluation 11/02/05   negative  . Helicobacter pylori gastritis 01/2016  . History of adenomatous polyp of colon 10/12/11   Dr. [name] (3 right side of colon- tubular adenomas removed)  . History of cardiovascular stress test 05/28/12   no ischemia, EF 37%, imaging results are unchanged and within normal variance  . History of chronic prostatitis   . History of retinal detachment    OS  . History of vertigo    + Hx of posterior HA's.  Neuro (Dr. Erling Cruz) eval 2011.  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  . Hypertension   . Left ventricular dysfunction 05/31/12   EF 40-45%, LA mod-severe dilated, AFIB  . Lumbar spondylosis    lumbosacral radiculopathy at L4 by EMG testing, right foot drop (neurologist is Dr. Linus Salmons with [facility] in [place])--neurologist referred him to neurosurgery  . Nephrolithiasis 07/2012   Left UVJ 2 mm stone with dilation of renal collecting system and  slight hydroureter on right  . 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 Permanent atrial fibrillation ([place])    DCCV 07/09/13-converted, lasted two days, then back into afib--needs lifetime anticoagulation (Xarelto as of 09/2014)  . Shortness of breath     Past Surgical History:  Procedure Laterality Date  . CARDIOVASCULAR STRESS TEST  2012   nuclear perfusion study: low risk scan  . CARDIOVERSION  07/09/2012   Procedure: CARDIOVERSION;  Surgeon: [name], MD;  Location: [facility] ENDOSCOPY;  Service: Cardiovascular;  Laterality: N/A;  . COLONOSCOPY W/ POLYPECTOMY  approx 2006; repeated 09/2011   Polyps on 2013 EGD as well, repeat 12/2014  . ESOPHAGOGASTRODUODENOSCOPY  10/18/06   Done due to chronic GERD: Normal, bx showed no barrett's esophagus (Dr. [name])  . EYE SURGERY     retinal detachment OS+ cataract surgery  . INSERT / REPLACE / REMOVE PACEMAKER  02/05/2012   dual chamber, sinus node dysfunction, sinus arrest, PAF, Medtronic Revo serial#-[id number] H: last checked 05/2015  . LUMBAR LAMINECTOMY  1976   left L4-5  . PERMANENT PACEMAKER INSERTION N/A 02/05/2012   Procedure: PERMANENT PACEMAKER INSERTION;  Surgeon: [name], MD;  Location: [place] CATH LAB;  Service: Cardiovascular;  Laterality: N/A;  . TRANSTHORACIC ECHOCARDIOGRAM  08/25/10; 05/2012   mild asymmetric LVH, normal systolic function, normal diastolic fxn, mild-to-mod mitral regurg, mild aortic valve  sclerosis and trace AI, mild aortic root dilatation. 2014 f/u showed EF 40-45%, mod LAE, A FIB    Outpatient Medications Prior to Visit  Medication Sig Dispense Refill  . bismuth-metronidazole-tetracycline (PYLERA) 140-125-125 MG capsule Take 3 capsules by mouth 4 (four) times daily -  before meals and at bedtime. 120 capsule 0  . colchicine 0.6 MG tablet Take 1 tablet (0.6 mg total) by mouth daily. 15 tablet 0  . fluticasone (CUTIVATE) 0.05 % cream Apply topically 2  (two) times daily. 30 g 1  . gabapentin (NEURONTIN) 300 MG capsule TAKE 1 CAPSULE THREE TIMES A DAY 270 capsule 3  . glipiZIDE (GLIPIZIDE XL) 5 MG 24 hr tablet Take 1 tablet (5 mg total) by mouth every morning. 90 tablet 1  . glucose blood test strip Use as instructed 100 each 3  . Lancets (PRECISION ULTRA LANCET) MISC Use as instructed 100 each 3  . latanoprost (XALATAN) 0.005 % ophthalmic solution Place 1 drop into both eyes at bedtime. Instill 1 drop in each eye at bedtime  12  . losartan (COZAAR) 100 MG tablet TAKE 1 TABLET DAILY 90 tablet 1  . meclizine (ANTIVERT) 25 MG tablet Take 1 tablet (25 mg total) by mouth 3 (three) times daily as needed. For vertigo. 270 tablet 0  . metFORMIN (GLUCOPHAGE) 1000 MG tablet Take 1 tablet (1,000 mg total) by mouth 2 (two) times daily. 180 tablet 1  . metoprolol succinate (TOPROL-XL) 100 MG 24 hr tablet 1 and 1/2 tabs po qd 135 tablet 3  . omeprazole (PRILOSEC) 40 MG capsule Take 1 capsule (40 mg total) by mouth daily. 90 capsule 3  . oxyCODONE-acetaminophen (PERCOCET) 5-325 MG tablet Take 1-2 tablets by mouth every 4 (four) hours as needed. 20 tablet 0  . rOPINIRole (REQUIP) 1 MG tablet TAKE 1 TABLET DAILY (NEED OFFICE VISIT FOR MORE REFILLS) 90 tablet 1  . sertraline (ZOLOFT) 100 MG tablet TAKE 1 TABLET DAILY 90 tablet 1  . simvastatin (ZOCOR) 20 MG tablet Take 1 tablet (20 mg total) by mouth every evening. 90 tablet 1  . sitaGLIPtin (JANUVIA) 100 MG tablet Take 1 tablet (100 mg total) by mouth daily. 90 tablet 1  . XARELTO 20 MG TABS tablet TAKE 1 TABLET DAILY WITH SUPPER 90 tablet 1   No facility-administered medications prior to visit.     No Known Allergies  ROS As per HPI  PE: Blood pressure 119/81, pulse 77, temperature 98.2 F (36.8 C), temperature source Oral, resp. rate 16, height 5\' 11"  (1.803 m), weight 223 lb 12 oz (101.5 kg), SpO2 98 %. Gen: Alert, well appearing.  Patient is oriented to person, place, time, and situation. Right  knee: focal TTP over most of central aspect of patella, with minimal softening of overlying patellar skin/soft tissue.  No significant swelling and no fluctuance over patella.  No erythema and no excessive warmth palpable over patella.  No knee effusion.  No pes anserine tenderness.   He has pain over R patella when he gets leg into full extension and flexion.  LABS:    Chemistry      Component Value Date/Time   NA 138 02/21/2016 1629   K 4.7 02/21/2016 1629   CL 104 02/21/2016 1629   CO2 25 02/21/2016 1629   BUN 21 02/21/2016 1629   CREATININE 1.08 02/21/2016 1629      Component Value Date/Time   CALCIUM 9.2 02/21/2016 1629   ALKPHOS 78 02/21/2016 1629   AST 17 02/21/2016 1629  ALT 17 02/21/2016 1629   BILITOT 0.7 02/21/2016 1629      IMPRESSION AND PLAN:  Prepatellar bursitis, R knee. Doubtful related to gout. Treat with prednisone 20mg ; 2 tabs qd x 2d, 1 tab qd x 3d, then 1/2 tab qd x 2d. Continue ice 30 min per day.  An After Visit Summary was printed and given to the patient.   FOLLOW UP: No Follow-up on file.   Signed:  Crissie Sickles, MD           03/02/2016

## 2016-03-02 NOTE — Progress Notes (Signed)
Pre visit review using our clinic review tool, if applicable. No additional management support is needed unless otherwise documented below in the visit note. 

## 2016-03-02 NOTE — Telephone Encounter (Signed)
Pts wife advised and voiced understanding, okay per DPR. 

## 2016-03-06 ENCOUNTER — Ambulatory Visit: Payer: Medicare Other | Admitting: Family Medicine

## 2016-03-14 ENCOUNTER — Encounter: Payer: Self-pay | Admitting: Family Medicine

## 2016-03-14 ENCOUNTER — Ambulatory Visit (INDEPENDENT_AMBULATORY_CARE_PROVIDER_SITE_OTHER): Payer: Medicare Other | Admitting: Family Medicine

## 2016-03-14 ENCOUNTER — Other Ambulatory Visit: Payer: Self-pay | Admitting: Cardiovascular Disease

## 2016-03-14 VITALS — BP 133/93 | HR 76 | Temp 98.0°F | Resp 16 | Ht 71.0 in | Wt 223.2 lb

## 2016-03-14 DIAGNOSIS — E118 Type 2 diabetes mellitus with unspecified complications: Secondary | ICD-10-CM | POA: Diagnosis not present

## 2016-03-14 DIAGNOSIS — E785 Hyperlipidemia, unspecified: Secondary | ICD-10-CM

## 2016-03-14 DIAGNOSIS — I10 Essential (primary) hypertension: Secondary | ICD-10-CM

## 2016-03-14 DIAGNOSIS — R0609 Other forms of dyspnea: Secondary | ICD-10-CM

## 2016-03-14 DIAGNOSIS — R0600 Dyspnea, unspecified: Secondary | ICD-10-CM

## 2016-03-14 DIAGNOSIS — M7040 Prepatellar bursitis, unspecified knee: Secondary | ICD-10-CM

## 2016-03-14 DIAGNOSIS — N183 Chronic kidney disease, stage 3 unspecified: Secondary | ICD-10-CM

## 2016-03-14 LAB — POCT GLYCOSYLATED HEMOGLOBIN (HGB A1C): Hemoglobin A1C: 6.8

## 2016-03-14 NOTE — Progress Notes (Signed)
OFFICE VISIT  03/14/2016   CC:  Chief Complaint  Patient presents with  . Follow-up     HPI:    Patient is a 75 y.o. Caucasian male who presents for 3 mo f/u DM 2, HTN, HLD, and CRI stage II. Of note, I saw him 03/02/16 and rx'd 7d of systemic steroids for R knee prepatellar bursitis. He only had to take a couple of these pills and his knee felt back to normal.  Not eating diabetic diet.   Checks glucose "every once in a while": 130s-170s.  Compliant with januvia, metformin, and glipizide xl.  BP monitoring: not in last few weeks but prior to that he was checking it and it was [id number] systolic, [id number] diastolic. He has not taking any of his meds today.  He complains of dyspnea on exertion with simply walking but mostly feels SOB with carrying something heavy (like groceries from the car to house).  Has been feeling this way for about 1 yr.  1 min of rest he returns to normal. No orthopnea but he endorses PND on some nights.  Doesn't have to sit on side of bed to catch his breath. No chest pain.  He feels his heart racing when he gets DOE.    He does say he coughs all the time--productive of white or yellow mucous, and describes some intermittent, random wheezing coming from chest that can occur simply laying on couch watching tV. He has never smoked.  No LE swelling.   Past Medical History:  Diagnosis Date  . Arthritis   . BPH (benign prostatic hypertrophy) 06/2011   Irritative sx's; pt declined trial of anticholinergic per Urology records  . Chronic low back pain   . Chronic renal insufficiency, stage III (moderate) 2015   CrCl about 60 ml/min  . Depression   . Diabetes mellitus   . Episodic low back pain 01/22/2013   w/intermittent radiculitis (12/2014 his neurologist referred him to pain mgmt for epidural steroid injection)  . GERD (gastroesophageal reflux disease)   . H/O tilt table evaluation 11/02/05   negative  . Helicobacter pylori gastritis 01/2016  . History of  adenomatous polyp of colon 10/12/11   Dr. [name] (3 right side of colon- tubular adenomas removed)  . History of cardiovascular stress test 05/28/12   no ischemia, EF 37%, imaging results are unchanged and within normal variance  . History of chronic prostatitis   . History of retinal detachment    OS  . History of vertigo    + Hx of posterior HA's.  Neuro (Dr. Erling Cruz) eval 2011.  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  . Hypertension   . Left ventricular dysfunction 05/31/12   EF 40-45%, LA mod-severe dilated, AFIB  . Lumbar spondylosis    lumbosacral radiculopathy at L4 by EMG testing, right foot drop (neurologist is Dr. Linus Salmons with [facility] in [place])--neurologist referred him to neurosurgery  . Nephrolithiasis 07/2012   Left UVJ 2 mm stone with dilation of renal collecting system and slight hydroureter on right  . 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 Permanent atrial fibrillation ([place])    DCCV 07/09/13-converted, lasted two days, then back into afib--needs lifetime anticoagulation (Xarelto as of 09/2014)  . Shortness of breath     Past Surgical History:  Procedure Laterality Date  . CARDIOVASCULAR STRESS TEST  2012   nuclear perfusion study: low risk  scan  . CARDIOVERSION  07/09/2012   Procedure: CARDIOVERSION;  Surgeon: [name], MD;  Location: [facility];  Service: Cardiovascular;  Laterality: N/A;  . COLONOSCOPY W/ POLYPECTOMY  approx 2006; repeated 09/2011   Polyps on 2013 EGD as well, repeat 12/2014  . ESOPHAGOGASTRODUODENOSCOPY  10/18/06   Done due to chronic GERD: Normal, bx showed no barrett's esophagus (Dr. [name])  . EYE SURGERY     retinal detachment OS+ cataract surgery  . INSERT / REPLACE / REMOVE PACEMAKER  02/05/2012   dual chamber, sinus node dysfunction, sinus arrest, PAF, Medtronic Revo serial#-[id number] H:  last checked 05/2015  . LUMBAR LAMINECTOMY  1976   left L4-5  . PERMANENT PACEMAKER INSERTION N/A 02/05/2012   Procedure: PERMANENT PACEMAKER INSERTION;  Surgeon: [name], MD;  Location: [facility];  Service: Cardiovascular;  Laterality: N/A;  . TRANSTHORACIC ECHOCARDIOGRAM  08/25/10; 05/2012   mild asymmetric LVH, normal systolic function, normal diastolic fxn, mild-to-mod mitral regurg, mild aortic valve sclerosis and trace AI, mild aortic root dilatation. 2014 f/u showed EF 40-45%, mod LAE, A FIB    Outpatient Medications Prior to Visit  Medication Sig Dispense Refill  . bismuth-metronidazole-tetracycline (PYLERA) 140-125-125 MG capsule Take 3 capsules by mouth 4 (four) times daily -  before meals and at bedtime. 120 capsule 0  . colchicine 0.6 MG tablet Take 1 tablet (0.6 mg total) by mouth daily. 15 tablet 0  . fluticasone (CUTIVATE) 0.05 % cream Apply topically 2 (two) times daily. 30 g 1  . gabapentin (NEURONTIN) 300 MG capsule TAKE 1 CAPSULE THREE TIMES A DAY 270 capsule 3  . glipiZIDE (GLIPIZIDE XL) 5 MG 24 hr tablet Take 1 tablet (5 mg total) by mouth every morning. 90 tablet 1  . glucose blood test strip Use as instructed 100 each 3  . Lancets (PRECISION ULTRA LANCET) MISC Use as instructed 100 each 3  . latanoprost (XALATAN) 0.005 % ophthalmic solution Place 1 drop into both eyes at bedtime. Instill 1 drop in each eye at bedtime  12  . losartan (COZAAR) 100 MG tablet TAKE 1 TABLET DAILY 90 tablet 1  . meclizine (ANTIVERT) 25 MG tablet Take 1 tablet (25 mg total) by mouth 3 (three) times daily as needed. For vertigo. 270 tablet 0  . metFORMIN (GLUCOPHAGE) 1000 MG tablet Take 1 tablet (1,000 mg total) by mouth 2 (two) times daily. 180 tablet 1  . metoprolol succinate (TOPROL-XL) 100 MG 24 hr tablet 1 and 1/2 tabs po qd 135 tablet 3  . omeprazole (PRILOSEC) 40 MG capsule Take 1 capsule (40 mg total) by mouth daily. 90 capsule 3  . oxyCODONE-acetaminophen (PERCOCET) 5-325 MG  tablet Take 1-2 tablets by mouth every 4 (four) hours as needed. 20 tablet 0  . rOPINIRole (REQUIP) 1 MG tablet TAKE 1 TABLET DAILY (NEED OFFICE VISIT FOR MORE REFILLS) 90 tablet 1  . sertraline (ZOLOFT) 100 MG tablet TAKE 1 TABLET DAILY 90 tablet 1  . simvastatin (ZOCOR) 20 MG tablet Take 1 tablet (20 mg total) by mouth every evening. 90 tablet 1  . sitaGLIPtin (JANUVIA) 100 MG tablet Take 1 tablet (100 mg total) by mouth daily. 90 tablet 1  . XARELTO 20 MG TABS tablet TAKE 1 TABLET DAILY WITH SUPPER 90 tablet 1  . predniSONE (DELTASONE) 20 MG tablet 2 tabs po qd x 2d, then 1 tab po qd x 3d, then 1/2 tab po qd x 2 (Patient not taking: Reported on 03/14/2016) 8 tablet 0   No facility-administered  medications prior to visit.     No Known Allergies  ROS As per HPI  PE: Blood pressure (!) 133/93, pulse 76, temperature 98 F (36.7 C), temperature source Oral, resp. rate 16, height 5\' 11"  (1.803 m), weight 223 lb 4 oz (101.3 kg), SpO2 96 %. Gen: Alert, well appearing.  Patient is oriented to person, place, time, and situation. [id number]: no injection, icteris, swelling, or exudate.  EOMI, PERRLA. Mouth: lips without lesion/swelling.  Oral mucosa pink and moist. Oropharynx without erythema, exudate, or swelling.  CV: RRR, distant S1 and S2.  No audible m/r/g. Chest is clear, no wheezing or rales. Normal symmetric air entry throughout both lung fields. No chest wall deformities or tenderness. EXT: no clubbing, cyanosis, or edema.   LABS:  Lab Results  Component Value Date   TSH 2.31 02/05/2014   Lab Results  Component Value Date   WBC 5.0 02/21/2016   HGB 14.1 02/21/2016   HCT 42.8 02/21/2016   MCV 90.1 02/21/2016   PLT 159 02/21/2016   Lab Results  Component Value Date   CREATININE 1.08 02/21/2016   BUN 21 02/21/2016   NA 138 02/21/2016   K 4.7 02/21/2016   CL 104 02/21/2016   CO2 25 02/21/2016   Lab Results  Component Value Date   ALT 17 02/21/2016   AST 17 02/21/2016    ALKPHOS 78 02/21/2016   BILITOT 0.7 02/21/2016   Lab Results  Component Value Date   CHOL 128 09/20/2015   Lab Results  Component Value Date   HDL 43.80 09/20/2015   Lab Results  Component Value Date   LDLCALC 63 09/20/2015   Lab Results  Component Value Date   TRIG 109.0 09/20/2015   Lab Results  Component Value Date   CHOLHDL 3 09/20/2015   Lab Results  Component Value Date   HGBA1C 6.8 03/14/2016   POC HbA1c today was 6.8%  IMPRESSION AND PLAN:  1) DOE: this has been chronic and likely represents mild chronic systolic CHF + physical deconditioning. Plan is to repeat CXR and transthoracic echo.  If neither of these is informative, then will proceed with PFTs.  2) HTN: control is decent, but I want to get him back into routine of checking it several times a week so we can better determine an avg systolic and diastolic.  No change in med for now. Lytes normal about 3 wks ago.  3) HLD: tolerating statin.  Good lipid panel 6 mo ago.  AST/ALT recently normal.   4) CRI stage II/III: most recent GFR about 3 wks ago was in the low 60s.  No recheck needed today.  5) DM 2: good control.  Hba1c 6.8% today.  6) Prepatellar bursitis recently: resolved with only a couple of doses of prednisone.  An After Visit Summary was printed and given to the patient.  FOLLOW UP: Return in about 3 months (around 06/14/2016) for routine chronic illness f/u.  Signed:  Crissie Sickles, MD           03/14/2016

## 2016-03-14 NOTE — Progress Notes (Signed)
Pre visit review using our clinic review tool, if applicable. No additional management support is needed unless otherwise documented below in the visit note. 

## 2016-03-17 ENCOUNTER — Ambulatory Visit (HOSPITAL_BASED_OUTPATIENT_CLINIC_OR_DEPARTMENT_OTHER)
Admission: RE | Admit: 2016-03-17 | Discharge: 2016-03-17 | Disposition: A | Discharge status: Home / Self Care | Payer: Medicare Other | Source: Ambulatory Visit | Attending: Family Medicine | Admitting: Family Medicine

## 2016-03-17 DIAGNOSIS — R0609 Other forms of dyspnea: Secondary | ICD-10-CM | POA: Diagnosis not present

## 2016-03-17 DIAGNOSIS — R0602 Shortness of breath: Secondary | ICD-10-CM | POA: Diagnosis not present

## 2016-03-17 DIAGNOSIS — R0600 Dyspnea, unspecified: Secondary | ICD-10-CM

## 2016-03-22 ENCOUNTER — Ambulatory Visit (INDEPENDENT_AMBULATORY_CARE_PROVIDER_SITE_OTHER): Payer: Medicare Other | Admitting: *Deleted

## 2016-03-22 DIAGNOSIS — I495 Sick sinus syndrome: Secondary | ICD-10-CM | POA: Diagnosis not present

## 2016-03-22 NOTE — Progress Notes (Signed)
Remote pacemaker transmission.   

## 2016-03-23 ENCOUNTER — Encounter: Payer: Self-pay | Admitting: Family Medicine

## 2016-03-23 ENCOUNTER — Other Ambulatory Visit: Payer: Self-pay

## 2016-03-23 ENCOUNTER — Ambulatory Visit (HOSPITAL_COMMUNITY): Payer: Medicare Other | Attending: Cardiology

## 2016-03-23 DIAGNOSIS — E1122 Type 2 diabetes mellitus with diabetic chronic kidney disease: Secondary | ICD-10-CM | POA: Insufficient documentation

## 2016-03-23 DIAGNOSIS — I4891 Unspecified atrial fibrillation: Secondary | ICD-10-CM | POA: Diagnosis not present

## 2016-03-23 DIAGNOSIS — I77810 Thoracic aortic ectasia: Secondary | ICD-10-CM | POA: Insufficient documentation

## 2016-03-23 DIAGNOSIS — R0609 Other forms of dyspnea: Secondary | ICD-10-CM

## 2016-03-23 DIAGNOSIS — N189 Chronic kidney disease, unspecified: Secondary | ICD-10-CM | POA: Insufficient documentation

## 2016-03-23 DIAGNOSIS — R0600 Dyspnea, unspecified: Secondary | ICD-10-CM

## 2016-03-23 DIAGNOSIS — I1310 Hypertensive heart and chronic kidney disease without heart failure, with stage 1 through stage 4 chronic kidney disease, or unspecified chronic kidney disease: Secondary | ICD-10-CM | POA: Insufficient documentation

## 2016-03-24 ENCOUNTER — Encounter: Payer: Self-pay | Admitting: Cardiology

## 2016-03-26 ENCOUNTER — Other Ambulatory Visit: Payer: Self-pay | Admitting: Family Medicine

## 2016-03-28 ENCOUNTER — Encounter: Payer: Self-pay | Admitting: Family Medicine

## 2016-03-30 ENCOUNTER — Encounter: Payer: Self-pay | Admitting: Physician Assistant

## 2016-03-30 ENCOUNTER — Ambulatory Visit (INDEPENDENT_AMBULATORY_CARE_PROVIDER_SITE_OTHER): Payer: Medicare Other | Admitting: Physician Assistant

## 2016-03-30 VITALS — BP 136/70 | HR 80 | Ht 71.0 in | Wt 230.0 lb

## 2016-03-30 DIAGNOSIS — I5041 Acute combined systolic (congestive) and diastolic (congestive) heart failure: Secondary | ICD-10-CM

## 2016-03-30 DIAGNOSIS — I482 Chronic atrial fibrillation: Secondary | ICD-10-CM | POA: Diagnosis not present

## 2016-03-30 DIAGNOSIS — I442 Atrioventricular block, complete: Secondary | ICD-10-CM | POA: Diagnosis not present

## 2016-03-30 DIAGNOSIS — I4821 Permanent atrial fibrillation: Secondary | ICD-10-CM

## 2016-03-30 DIAGNOSIS — Z79899 Other long term (current) drug therapy: Secondary | ICD-10-CM | POA: Diagnosis not present

## 2016-03-30 MED ORDER — FUROSEMIDE 20 MG PO TABS: 20.0000 mg | ORAL_TABLET | Freq: Every day | ORAL | 2 refills | Status: DC

## 2016-03-30 MED ORDER — POTASSIUM CHLORIDE ER 10 MEQ PO TBCR: 10.0000 meq | EXTENDED_RELEASE_TABLET | Freq: Every day | ORAL | 2 refills | Status: DC

## 2016-03-30 NOTE — Patient Instructions (Signed)
Medication Instructions:  START FUROSEMIDE 20 MG DAILY  START POTASSIUM 10 MEQ DAILY   THE ABOVE ARE DAILY FOR 5 DAYS ONLY AND THEN AS NEEDED FOR SWELLING OR WEIGHT GAIN OF 3 POUNDS OVERNIGHT OR 5 POUNDS IN 1 WEEK.  Labwork: BMET AT YOUR PRIMARY CARE PHYSICIAN IN 1 WEEK  Testing/Procedures: NONE  Follow-Up: Your physician recommends that you schedule a follow-up appointment in: WITH RHONDA B PA OR DR CROITORU IN 1 MONTH  Any Other Special Instructions Will Be Listed Below (If Applicable). WEIGHT YOURSELF DAILY   Low-Sodium Eating Plan Sodium raises blood pressure and causes water to be held in the body. Getting less sodium from food will help lower your blood pressure, reduce any swelling, and protect your heart, liver, and kidneys. We get sodium by adding salt (sodium chloride) to food. Most of our sodium comes from canned, boxed, and frozen foods. Restaurant foods, fast foods, and pizza are also very high in sodium. Even if you take medicine to lower your blood pressure or to reduce fluid in your body, getting less sodium from your food is important. WHAT IS MY PLAN? Most people should limit their sodium intake to 2,300 mg a day. Your health care provider recommends that you limit your sodium intake to __________ a day.  WHAT DO I NEED TO KNOW ABOUT THIS EATING PLAN? For the low-sodium eating plan, you will follow these general guidelines:  Choose foods with a % Daily Value for sodium of less than 5% (as listed on the food label).   Use salt-free seasonings or herbs instead of table salt or sea salt.   Check with your health care provider or pharmacist before using salt substitutes.   Eat fresh foods.  Eat more vegetables and fruits.  Limit canned vegetables. If you do use them, rinse them well to decrease the sodium.   Limit cheese to 1 oz (28 g) per day.   Eat lower-sodium products, often labeled as "lower sodium" or "no salt added."  Avoid foods that contain  monosodium glutamate (MSG). MSG is sometimes added to [place] food and some canned foods.  Check food labels (Nutrition Facts labels) on foods to learn how much sodium is in one serving.  Eat more home-cooked food and less restaurant, buffet, and fast food.  When eating at a restaurant, ask that your food be prepared with less salt, or no salt if possible.  HOW DO I READ FOOD LABELS FOR SODIUM INFORMATION? The Nutrition Facts label lists the amount of sodium in one serving of the food. If you eat more than one serving, you must multiply the listed amount of sodium by the number of servings. Food labels may also identify foods as:  Sodium free--Less than 5 mg in a serving.  Very low sodium--35 mg or less in a serving.  Low sodium--140 mg or less in a serving.  Light in sodium--50% less sodium in a serving. For example, if a food that usually has 300 mg of sodium is changed to become light in sodium, it will have 150 mg of sodium.  Reduced sodium--25% less sodium in a serving. For example, if a food that usually has 400 mg of sodium is changed to reduced sodium, it will have 300 mg of sodium. WHAT FOODS CAN I EAT? Grains Low-sodium cereals, including oats, puffed wheat and rice, and shredded wheat cereals. Low-sodium crackers. Unsalted rice and pasta. Lower-sodium bread.  Vegetables Frozen or fresh vegetables. Low-sodium or reduced-sodium canned vegetables. Low-sodium or reduced-sodium  tomato sauce and paste. Low-sodium or reduced-sodium tomato and vegetable juices.  Fruits Fresh, frozen, and canned fruit. Fruit juice.  Meat and Other Protein Products Low-sodium canned tuna and salmon. Fresh or frozen meat, poultry, seafood, and fish. Lamb. Unsalted nuts. Dried beans, peas, and lentils without added salt. Unsalted canned beans. Homemade soups without salt. Eggs.  Dairy Milk. Soy milk. Ricotta cheese. Low-sodium or reduced-sodium cheeses. Yogurt.  Condiments Fresh and dried  herbs and spices. Salt-free seasonings. Onion and garlic powders. Low-sodium varieties of mustard and ketchup. Fresh or refrigerated horseradish. Lemon juice.  Fats and Oils Reduced-sodium salad dressings. Unsalted butter.  Other Unsalted popcorn and pretzels.  The items listed above may not be a complete list of recommended foods or beverages. Contact your dietitian for more options. WHAT FOODS ARE NOT RECOMMENDED? Grains Instant hot cereals. Bread stuffing, pancake, and biscuit mixes. Croutons. Seasoned rice or pasta mixes. Noodle soup cups. Boxed or frozen macaroni and cheese. Self-rising flour. Regular salted crackers. Vegetables Regular canned vegetables. Regular canned tomato sauce and paste. Regular tomato and vegetable juices. Frozen vegetables in sauces. Salted [place] fries. Olives. Angie Fava. Relishes. Sauerkraut. Salsa. Meat and Other Protein Products Salted, canned, smoked, spiced, or pickled meats, seafood, or fish. Bacon, ham, sausage, hot dogs, corned beef, chipped beef, and packaged luncheon meats. Salt pork. Jerky. Pickled herring. Anchovies, regular canned tuna, and sardines. Salted nuts. Dairy Processed cheese and cheese spreads. Cheese curds. Blue cheese and cottage cheese. Buttermilk.  Condiments Onion and garlic salt, seasoned salt, table salt, and sea salt. Canned and packaged gravies. Worcestershire sauce. Tartar sauce. Barbecue sauce. Teriyaki sauce. Soy sauce, including reduced sodium. Steak sauce. Fish sauce. Oyster sauce. Cocktail sauce. Horseradish that you find on the shelf. Regular ketchup and mustard. Meat flavorings and tenderizers. Bouillon cubes. Hot sauce. Tabasco sauce. Marinades. Taco seasonings. Relishes. Fats and Oils Regular salad dressings. Salted butter. Margarine. Ghee. Bacon fat.  Other Potato and tortilla chips. Corn chips and puffs. Salted popcorn and pretzels. Canned or dried soups. Pizza. Frozen entrees and pot pies.  The items listed  above may not be a complete list of foods and beverages to avoid. Contact your dietitian for more information.   This information is not intended to replace advice given to you by your health care provider. Make sure you discuss any questions you have with your health care provider.   Document Released: 12/30/2001 Document Revised: 07/31/2014 Document Reviewed: 05/14/2013 Elsevier Interactive Patient Education [employer].

## 2016-03-30 NOTE — Progress Notes (Signed)
Thank you :)

## 2016-03-30 NOTE — Progress Notes (Signed)
Cardiology Office Note   Date:  03/30/2016   ID:  Mason [name], DOB 12/21/1940, MRN [id number]  PCP:  Tammi Sou, MD  Cardiologist:  Dr Sallyanne Kuster, last o.v. 05/2015 Barrett, Suanne Marker, PA-C   History of Present Illness: Mason [name] is a 75 y.o. 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   Last PPM transmission was 03/22/2016  Mason [name] presents for evaluation of increased dyspnea on exertion, abnormal echocardiogram, and abdominal pain  Mason [name] has noticed recently increased dyspnea on exertion. When he is carrying something, he gets short of breath much more quickly than usual. He feels short of breath just bending over. He also feels short of breath when walking longer distances. He describes PND, but denies orthopnea. He snores heavily, but has never been told he quits breathing while he is asleep. He has no history of lower extremity edema. He saw his primary care physician for this and had an echocardiogram. He has not had shortness of breath at rest  He has had dietary changes and GI changes in the last 6 months. He states he lost his taste for meat and doesn't eat very much me at all. He eats more beans and other vegetables. In the last 2 months or so, he has developed upper mid left abdominal pain. It is a cramping pain. It is not related to meals. The area of his abdomen just under his ribs and on his left near the midline, is mildly tender to palpation. There is no hernia in the area, no guarding.  He feels he does not understand the results of the echo.   Past Medical History:  Diagnosis Date  . Arthritis   . BPH (benign prostatic hypertrophy) 06/2011   Irritative sx's; pt declined trial of anticholinergic per Urology records  . Chronic low back pain   . Chronic renal insufficiency, stage III (moderate) 2015   CrCl about 60 ml/min  . Depression   . Diabetes mellitus   . Episodic low back pain 01/22/2013     w/intermittent radiculitis (12/2014 his neurologist referred him to pain mgmt for epidural steroid injection)  . GERD (gastroesophageal reflux disease)   . H/O tilt table evaluation 11/02/05   negative  . Helicobacter pylori gastritis 01/2016  . History of adenomatous polyp of colon 10/12/11   Dr. [name] (3 right side of colon- tubular adenomas removed)  . History of cardiovascular stress test 05/28/12   no ischemia, EF 37%, imaging results are unchanged and within normal variance  . History of chronic prostatitis   . History of retinal detachment    OS  . History of vertigo    + Hx of posterior HA's.  Neuro (Dr. Erling Cruz) eval 2011.  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  . Hypertension   . Left ventricular dysfunction 05/31/12   EF 40-45%, LA mod-severe dilated, AFIB  . Lumbar spondylosis    lumbosacral radiculopathy at L4 by EMG testing, right foot drop (neurologist is Dr. Linus Salmons with [facility] in [place])--neurologist referred him to neurosurgery  . Nephrolithiasis 07/2012   Left UVJ 2 mm stone with dilation of renal collecting system and slight hydroureter on right  . 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  [name] Permanent atrial fibrillation ([place])    DCCV 07/09/13-converted, lasted two days, then  back into afib--needs lifetime anticoagulation (Xarelto as of 09/2014)  . Shortness of breath     Past Surgical History:  Procedure Laterality Date  . CARDIOVASCULAR STRESS TEST  2012   nuclear perfusion study: low risk scan  . CARDIOVERSION  07/09/2012   Procedure: CARDIOVERSION;  Surgeon: [name], MD;  Location: [facility] ENDOSCOPY;  Service: Cardiovascular;  Laterality: N/A;  . COLONOSCOPY W/ POLYPECTOMY  approx 2006; repeated 09/2011   Polyps on 2013 EGD as well, repeat 12/2014  . ESOPHAGOGASTRODUODENOSCOPY  10/18/06   Done due to chronic  GERD: Normal, bx showed no barrett's esophagus (Dr. [name])  . EYE SURGERY     retinal detachment OS+ cataract surgery  . INSERT / REPLACE / REMOVE PACEMAKER  02/05/2012   dual chamber, sinus node dysfunction, sinus arrest, PAF, Medtronic Revo serial#-[id number] H: last checked 05/2015  . LUMBAR LAMINECTOMY  1976   left L4-5  . PERMANENT PACEMAKER INSERTION N/A 02/05/2012   Procedure: PERMANENT PACEMAKER INSERTION;  Surgeon: [name], MD;  Location: [place] CATH LAB;  Service: Cardiovascular;  Laterality: N/A;  . TRANSTHORACIC ECHOCARDIOGRAM  08/25/10; 05/2012; 03/23/16   mild asymmetric LVH, normal systolic function, normal diastolic fxn, mild-to-mod mitral regurg, mild aortic valve sclerosis and trace AI, mild aortic root dilatation. 2014 f/u showed EF 40-45%, mod LAE, A FIB.  02/2016 EF 40%, diffuse hypokinesis, grade 2 DD.    Current Outpatient Prescriptions  Medication Sig Dispense Refill  . bismuth-metronidazole-tetracycline (PYLERA) 140-125-125 MG capsule Take 3 capsules by mouth 4 (four) times daily -  before meals and at bedtime. 120 capsule 0  . colchicine 0.6 MG tablet Take 1 tablet (0.6 mg total) by mouth daily. 15 tablet 0  . fluticasone (CUTIVATE) 0.05 % cream Apply topically 2 (two) times daily. 30 g 1  . gabapentin (NEURONTIN) 300 MG capsule TAKE 1 CAPSULE THREE TIMES A DAY 270 capsule 3  . GLIPIZIDE XL 5 MG 24 hr tablet TAKE 1 TABLET EVERY MORNING 90 tablet 1  . glucose blood test strip Use as instructed 100 each 3  . Lancets (PRECISION ULTRA LANCET) MISC Use as instructed 100 each 3  . latanoprost (XALATAN) 0.005 % ophthalmic solution Place 1 drop into both eyes at bedtime. Instill 1 drop in each eye at bedtime  12  . losartan (COZAAR) 100 MG tablet TAKE 1 TABLET DAILY 90 tablet 1  . meclizine (ANTIVERT) 25 MG tablet Take 1 tablet (25 mg total) by mouth 3 (three) times daily as needed. For vertigo. 270 tablet 0  . metFORMIN (GLUCOPHAGE) 1000 MG tablet TAKE 1 TABLET TWICE A DAY 180  tablet 1  . metoprolol succinate (TOPROL-XL) 100 MG 24 hr tablet 1 and 1/2 tabs po qd 135 tablet 3  . omeprazole (PRILOSEC) 40 MG capsule Take 1 capsule (40 mg total) by mouth daily. 90 capsule 3  . oxyCODONE-acetaminophen (PERCOCET) 5-325 MG tablet Take 1-2 tablets by mouth every 4 (four) hours as needed. 20 tablet 0  . rOPINIRole (REQUIP) 1 MG tablet TAKE 1 TABLET DAILY (NEED OFFICE VISIT FOR MORE REFILLS) 90 tablet 1  . sertraline (ZOLOFT) 100 MG tablet TAKE 1 TABLET DAILY 90 tablet 1  . simvastatin (ZOCOR) 20 MG tablet TAKE 1 TABLET EVERY EVENING 90 tablet 1  . sitaGLIPtin (JANUVIA) 100 MG tablet Take 1 tablet (100 mg total) by mouth daily. 90 tablet 1  . XARELTO 20 MG TABS tablet TAKE 1 TABLET DAILY WITH SUPPER 90 tablet 1   No current facility-administered medications for this visit.  Allergies:   Review of patient's allergies indicates no known allergies.    Social History:  The patient  reports that he has never smoked. He has never used smokeless tobacco. He reports that he drinks about 7.2 oz of alcohol per week . He reports that he does not use drugs.   Family History:  The patient's family history includes Heart failure in his mother; Stroke in his father and mother.    ROS:  Please see the history of present illness. All other systems are reviewed and negative.    PHYSICAL EXAM: VS:  BP 136/70   Pulse 80   Ht 5\' 11"  (1.803 m)   Wt 230 lb (104.3 kg)   BMI 32.08 kg/m  , BMI Body mass index is 32.08 kg/m. GEN: Well nourished, well developed, male in no acute distress  HEENT: normal for age  Neck: JVD 9 cm, positive hepatojugular reflux, no carotid bruit, no masses Cardiac: RRR; no murmur, no rubs, or gallops Respiratory:  clear to auscultation bilaterally, normal work of breathing GI: soft, nontender, nondistended, + BS MS: no deformity or atrophy; no edema; distal pulses are 2+ in all 4 extremities   Skin: warm and dry, no rash Neuro:  Strength and sensation are  intact Psych: euthymic mood, full affect   EKG:  EKG is not ordered today.  PPM: 03/22/2016 Remote transmission reported, no results available  ECHO: 08/31 - Left ventricle: The cavity size was normal. Wall thickness was   increased in a pattern of mild LVH. The estimated ejection   fraction was 40%. Diffuse hypokinesis. Features are consistent   with a pseudonormal left ventricular filling pattern, with   concomitant abnormal relaxation and increased filling pressure   (grade 2 diastolic dysfunction). - Aortic valve: There was no stenosis. - Aorta: Ascending aortic diameter: 43 mm (S). - Ascending aorta: The ascending aorta was mildly dilated. - Mitral valve: There was trivial regurgitation. - Left atrium: The atrium was mildly dilated. - Right ventricle: The cavity size was normal. Pacer wire or   catheter noted in right ventricle. Systolic function was mildly   reduced. - Right atrium: The atrium was mildly dilated. - Tricuspid valve: Peak RV-RA gradient (S): 26 mm Hg. - Pulmonary arteries: PA peak pressure: 34 mm Hg (S). - Systemic veins: IVC measured 2.0 cm with < 50% respirophasic   variation, suggesting RA pressure 8 mmHg. Impressions: - Normal LV size with mild LV hypertrophy. EF 40%, diffuse   hypokinesis. Moderate diastolic dysfunction. Normal RV size with   mildly decreased systolic function.  Recent Labs: 02/21/2016: ALT 17; BUN 21; Creat 1.08; Hemoglobin 14.1; Platelets 159; Potassium 4.7; Sodium 138    Lipid Panel    Component Value Date/Time   CHOL 128 09/20/2015 0946   TRIG 109.0 09/20/2015 0946   HDL 43.80 09/20/2015 0946   CHOLHDL 3 09/20/2015 0946   VLDL 21.8 09/20/2015 0946   LDLCALC 63 09/20/2015 0946     Wt Readings from Last 3 Encounters:  03/30/16 230 lb (104.3 kg)  03/14/16 223 lb 4 oz (101.3 kg)  03/02/16 223 lb 12 oz (101.5 kg)     Other studies Reviewed: Additional studies/ records that were reviewed today include: Office notes and  testing.  ASSESSMENT AND PLAN:  1.  Acute systolic and possibly diastolic CHF:  Dr. Sallyanne Kuster recommends consideration of CRT upgrade to his device if the atrial fibrillation is felt responsible for the CHF. The cause is unclear, and he is only mildly  volume overloaded. On his recent echo, his PAS was only 14, but he does have signs and symptoms of volume overload by exam. The contribution of possible diastolic dysfunction is unclear.  At this time, I will start low-dose Lasix and potassium. He is to be on it daily for 5 days and then use them when necessary. He is to start checking daily weights and was given information on low sodium diet and when to use the diuretics. He is to get a BMET next week, which he wishes to get through his primary care provider.  He is already on a beta blocker and an ARB. Inspra can be considered if he is to be on a daily diuretic. He is to follow-up in a month, and determine if his symptoms have improved/resolved and then decide on further treatment.  2. Abdominal pain: He may have excess volume in his abdomen as his hepatojugular reflux was strongly positive. If his abdominal pain does not improve with diuresis, consider further evaluation by IM/GI. He was recently treated for H. pylori, without any improvement in his symptoms.  It is concerning that the patient with a history of exposure to agent orange in [place], and colon polyps, has had changes in his bowel movements and appetite.   Current medicines are reviewed at length with the patient today.  The patient does not have concerns regarding medicines.  The following changes have been made:  Add Lasix and potassium  Labs/ tests ordered today include:   Orders Placed This Encounter  Procedures  . Basic metabolic panel     Disposition:   FU with Dr. Sallyanne Kuster  Signed, Rosaria Ferries, PA-C  03/30/2016 2:08 PM    [facility] Phone: [phone]; Fax: [phone]  This note  was written with the assistance of speech recognition software. Please excuse any transcriptional errors.

## 2016-03-31 ENCOUNTER — Other Ambulatory Visit: Payer: Self-pay | Admitting: Family Medicine

## 2016-03-31 ENCOUNTER — Encounter: Payer: Self-pay | Admitting: Family Medicine

## 2016-03-31 DIAGNOSIS — I5042 Chronic combined systolic (congestive) and diastolic (congestive) heart failure: Secondary | ICD-10-CM

## 2016-03-31 NOTE — Progress Notes (Signed)
Thanks MCr 

## 2016-04-03 DIAGNOSIS — M19012 Primary osteoarthritis, left shoulder: Secondary | ICD-10-CM | POA: Diagnosis not present

## 2016-04-06 ENCOUNTER — Telehealth: Payer: Self-pay

## 2016-04-06 ENCOUNTER — Encounter: Payer: Self-pay | Admitting: Family Medicine

## 2016-04-06 NOTE — Telephone Encounter (Signed)
LM requesting patient to return call to schedule AWV.

## 2016-04-07 ENCOUNTER — Other Ambulatory Visit: Payer: Self-pay | Admitting: Physician Assistant

## 2016-04-07 ENCOUNTER — Other Ambulatory Visit (INDEPENDENT_AMBULATORY_CARE_PROVIDER_SITE_OTHER): Payer: Medicare Other

## 2016-04-07 DIAGNOSIS — E8770 Fluid overload, unspecified: Secondary | ICD-10-CM | POA: Diagnosis not present

## 2016-04-07 LAB — CUP PACEART REMOTE DEVICE CHECK
Battery Voltage: 2.93 V
Brady Statistic RV Percent Paced: 99.94 %
Date Time Interrogation Session: 20170830155414
Implantable Lead Implant Date: 20130715
Implantable Lead Implant Date: 20130715
Implantable Lead Location: 753859
Implantable Lead Location: 753860
Implantable Lead Model: 5086
Implantable Lead Model: 5086
Lead Channel Impedance Value: 400 Ohm
Lead Channel Impedance Value: 560 Ohm
Lead Channel Pacing Threshold Amplitude: 2.5 V
Lead Channel Pacing Threshold Pulse Width: 0.4 ms
Lead Channel Setting Pacing Amplitude: 2.5 V
Lead Channel Setting Pacing Pulse Width: 0.4 ms
Lead Channel Setting Sensing Sensitivity: 0.9 mV

## 2016-04-07 LAB — BASIC METABOLIC PANEL
BUN: 25 mg/dL (ref 7–25)
CO2: 29 mmol/L (ref 20–31)
Calcium: 9.5 mg/dL (ref 8.6–10.3)
Chloride: 101 mmol/L (ref 98–110)
Creat: 1.42 mg/dL — ABNORMAL HIGH (ref 0.70–1.18)
Glucose, Bld: 95 mg/dL (ref 65–99)
Potassium: 4.4 mmol/L (ref 3.5–5.3)
Sodium: 139 mmol/L (ref 135–146)

## 2016-04-18 ENCOUNTER — Encounter: Payer: Self-pay | Admitting: Family Medicine

## 2016-04-18 DIAGNOSIS — H401112 Primary open-angle glaucoma, right eye, moderate stage: Secondary | ICD-10-CM | POA: Diagnosis not present

## 2016-04-18 DIAGNOSIS — E119 Type 2 diabetes mellitus without complications: Secondary | ICD-10-CM | POA: Diagnosis not present

## 2016-04-18 DIAGNOSIS — H26491 Other secondary cataract, right eye: Secondary | ICD-10-CM | POA: Diagnosis not present

## 2016-04-18 DIAGNOSIS — H401123 Primary open-angle glaucoma, left eye, severe stage: Secondary | ICD-10-CM | POA: Diagnosis not present

## 2016-04-18 DIAGNOSIS — Z961 Presence of intraocular lens: Secondary | ICD-10-CM | POA: Diagnosis not present

## 2016-04-18 LAB — HM DIABETES EYE EXAM

## 2016-04-26 ENCOUNTER — Ambulatory Visit (HOSPITAL_BASED_OUTPATIENT_CLINIC_OR_DEPARTMENT_OTHER)
Admission: RE | Admit: 2016-04-26 | Discharge: 2016-04-26 | Disposition: A | Discharge status: Home / Self Care | Payer: Medicare Other | Source: Ambulatory Visit | Attending: Family Medicine | Admitting: Family Medicine

## 2016-04-26 ENCOUNTER — Encounter: Payer: Self-pay | Admitting: Family Medicine

## 2016-04-26 ENCOUNTER — Ambulatory Visit (INDEPENDENT_AMBULATORY_CARE_PROVIDER_SITE_OTHER): Payer: Medicare Other | Admitting: Family Medicine

## 2016-04-26 VITALS — BP 136/83 | HR 74 | Temp 98.1°F | Resp 16 | Wt 229.8 lb

## 2016-04-26 DIAGNOSIS — M7041 Prepatellar bursitis, right knee: Secondary | ICD-10-CM

## 2016-04-26 DIAGNOSIS — M7989 Other specified soft tissue disorders: Secondary | ICD-10-CM | POA: Diagnosis not present

## 2016-04-26 DIAGNOSIS — M25561 Pain in right knee: Secondary | ICD-10-CM

## 2016-04-26 MED ORDER — PREDNISONE 20 MG PO TABS
ORAL_TABLET | ORAL | 0 refills | Status: DC
Start: 2016-04-26 — End: 2016-06-14

## 2016-04-26 NOTE — Progress Notes (Signed)
Pre visit review using our clinic review tool, if applicable. No additional management support is needed unless otherwise documented below in the visit note. 

## 2016-04-26 NOTE — Progress Notes (Signed)
OFFICE VISIT  04/26/2016   CC:  Chief Complaint  Patient presents with  . Knee Pain    right knee pain   HPI:    Patient is a 75 y.o. Caucasian male who presents for right knee pain. I saw him for this about 2 mo ago and gave a brief steroid taper for prepatellar bursitis and initially this helped and it all went away but it's effectiveness waned.  Has been swollen and painful again for the last 5d, and prior to that was just mildly uncomfortable and swollen.  No otc meds taken for it. No fever, no malaise.  No recent knee trauma.  Past Medical History:  Diagnosis Date  . Arthritis    left shoulder (Dr. [name]): steroid injection 04/03/16.  Plan for TSA surgery 06/2016.  [name] BPH (benign prostatic hypertrophy) 06/2011   Irritative sx's; pt declined trial of anticholinergic per Urology records  . Chronic combined systolic and diastolic heart failure ([place]) 05/31/2012   EF 40-45%, LA mod-severe dilated, AFIB.   02/2016 EF 40%, diffuse hypokinesis, grade 2 DD.  [name] Chronic low back pain   . Chronic renal insufficiency, stage III (moderate) 2015   CrCl about 60 ml/min  . Depression   . Diabetes mellitus   . Episodic low back pain 01/22/2013   w/intermittent radiculitis (12/2014 his neurologist referred him to pain mgmt for epidural steroid injection)  . GERD (gastroesophageal reflux disease)   . H/O tilt table evaluation 11/02/05   negative  . Helicobacter pylori gastritis 01/2016  . History of adenomatous polyp of colon 10/12/11   Dr. [name] (3 right side of colon- tubular adenomas removed)  . History of cardiovascular stress test 05/28/12   no ischemia, EF 37%, imaging results are unchanged and within normal variance  . History of chronic prostatitis   . History of retinal detachment    OS  . History of vertigo    + Hx of posterior HA's.  Neuro (Dr. Erling Cruz) eval 2011.  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  .  Hypertension   . Lumbar spondylosis    lumbosacral radiculopathy at L4 by EMG testing, right foot drop (neurologist is Dr. Linus Salmons with [facility] in [place])--neurologist referred him to neurosurgery  . Nephrolithiasis 07/2012   Left UVJ 2 mm stone with dilation of renal collecting system and slight hydroureter on right  . 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  [name] Permanent atrial fibrillation ([place])    DCCV 07/09/13-converted, lasted two days, then back into afib--needs lifetime anticoagulation (Xarelto as of 09/2014)  . Shortness of breath     Past Surgical History:  Procedure Laterality Date  . CARDIOVASCULAR STRESS TEST  2012   nuclear perfusion study: low risk scan  . CARDIOVERSION  07/09/2012   Procedure: CARDIOVERSION;  Surgeon: [name], MD;  Location: [facility] ENDOSCOPY;  Service: Cardiovascular;  Laterality: N/A;  . COLONOSCOPY W/ POLYPECTOMY  approx 2006; repeated 09/2011   Polyps on 2013 EGD as well, repeat 12/2014  . ESOPHAGOGASTRODUODENOSCOPY  10/18/06   Done due to chronic GERD: Normal, bx showed no barrett's esophagus (Dr. [name])  . EYE SURGERY     retinal detachment OS+ cataract surgery  . INSERT / REPLACE / REMOVE PACEMAKER  02/05/2012   dual chamber, sinus node dysfunction, sinus arrest, PAF, Medtronic Revo serial#-[id number] H: last checked 05/2015  . LUMBAR LAMINECTOMY  1976   left L4-5  .  PERMANENT PACEMAKER INSERTION N/A 02/05/2012   Procedure: PERMANENT PACEMAKER INSERTION;  Surgeon: [name], MD;  Location: [facility] CATH LAB;  Service: Cardiovascular;  Laterality: N/A;  . TRANSTHORACIC ECHOCARDIOGRAM  08/25/10; 05/2012; 03/23/16   mild asymmetric LVH, normal systolic function, normal diastolic fxn, mild-to-mod mitral regurg, mild aortic valve sclerosis and trace AI, mild aortic root dilatation. 2014 f/u showed EF 40-45%, mod LAE, A FIB.  02/2016 EF 40%, diffuse hypokinesis, grade 2 DD.     Outpatient Medications Prior to Visit  Medication Sig Dispense Refill  . colchicine 0.6 MG tablet Take 1 tablet (0.6 mg total) by mouth daily. 15 tablet 0  . furosemide (LASIX) 20 MG tablet Take 1 tablet (20 mg total) by mouth daily. FOR 5 DAYS ONLY. THEN 1 DAILY AS NEED FOR WEIGHT GAIN/SWELLING 30 tablet 2  . gabapentin (NEURONTIN) 300 MG capsule TAKE 1 CAPSULE THREE TIMES A DAY 270 capsule 3  . GLIPIZIDE XL 5 MG 24 hr tablet TAKE 1 TABLET EVERY MORNING 90 tablet 1  . glucose blood test strip Use as instructed 100 each 3  . JANUVIA 100 MG tablet TAKE 1 TABLET DAILY 90 tablet 1  . Lancets (PRECISION ULTRA LANCET) MISC Use as instructed 100 each 3  . latanoprost (XALATAN) 0.005 % ophthalmic solution Place 1 drop into both eyes at bedtime. Instill 1 drop in each eye at bedtime  12  . losartan (COZAAR) 100 MG tablet TAKE 1 TABLET DAILY 90 tablet 1  . metFORMIN (GLUCOPHAGE) 1000 MG tablet TAKE 1 TABLET TWICE A DAY 180 tablet 1  . metoprolol succinate (TOPROL-XL) 100 MG 24 hr tablet 1 and 1/2 tabs po qd 135 tablet 3  . rOPINIRole (REQUIP) 1 MG tablet TAKE 1 TABLET DAILY (NEED OFFICE VISIT FOR MORE REFILLS) 90 tablet 1  . sertraline (ZOLOFT) 100 MG tablet TAKE 1 TABLET DAILY 90 tablet 1  . simvastatin (ZOCOR) 20 MG tablet TAKE 1 TABLET EVERY EVENING 90 tablet 1  . XARELTO 20 MG TABS tablet TAKE 1 TABLET DAILY WITH SUPPER 90 tablet 1  . omeprazole (PRILOSEC) 40 MG capsule Take 1 capsule (40 mg total) by mouth daily. (Patient not taking: Reported on 04/26/2016) 90 capsule 3  . potassium chloride (K-DUR) 10 MEQ tablet Take 1 tablet (10 mEq total) by mouth daily. FOR 5 DAYS ONLY. THEN 1 DAILY AS NEED FOR WEIGHT GAIN/SWELLING (Patient not taking: Reported on 04/26/2016) 30 tablet 2   No facility-administered medications prior to visit.     No Known Allergies  ROS As per HPI  PE: Blood pressure 136/83, pulse 74, temperature 98.1 F (36.7 C), temperature source Oral, resp. rate 16, weight 229 lb  12.8 oz (104.2 kg), SpO2 97 %. Gen: Alert, well appearing.  Patient is oriented to person, place, time, and situation. Right knee with anterior swelling, tenderness to palpation over patella but no erythema.  +Warmth over the patella/anterior knee.  ROM: full flexion is impaired.  No prob with full extension.  LABS:  none  IMPRESSION AND PLAN:  Right knee pain; I don't detect a true intra-articular knee effusion.  I believe he has prepatellar bursitis again. Will avoid aspiration at this time, esp since he takes xarelto. Prednisone 40mg  qd x 5d, then 20mg  qd x 5d, then 10mg  qd x 6d. If swelling not going down in about 1 week I encouraged him to make appt with his orthopedist. Will check knee x-ray today. Ice/compression encouraged.  An After Visit Summary was printed and given to the  patient.  FOLLOW UP: Return if symptoms worsen or fail to improve.  Signed:  Crissie Sickles, MD           04/26/2016

## 2016-05-01 ENCOUNTER — Ambulatory Visit (INDEPENDENT_AMBULATORY_CARE_PROVIDER_SITE_OTHER): Payer: Medicare Other | Admitting: Physician Assistant

## 2016-05-01 ENCOUNTER — Encounter: Payer: Self-pay | Admitting: Physician Assistant

## 2016-05-01 VITALS — BP 159/100 | HR 85 | Ht 71.0 in | Wt 222.8 lb

## 2016-05-01 DIAGNOSIS — R0609 Other forms of dyspnea: Secondary | ICD-10-CM

## 2016-05-01 DIAGNOSIS — R0600 Dyspnea, unspecified: Secondary | ICD-10-CM

## 2016-05-01 DIAGNOSIS — R079 Chest pain, unspecified: Secondary | ICD-10-CM

## 2016-05-01 DIAGNOSIS — M19012 Primary osteoarthritis, left shoulder: Secondary | ICD-10-CM | POA: Diagnosis not present

## 2016-05-01 DIAGNOSIS — I5042 Chronic combined systolic (congestive) and diastolic (congestive) heart failure: Secondary | ICD-10-CM | POA: Diagnosis not present

## 2016-05-01 NOTE — Patient Instructions (Addendum)
Medication Instructions:  Continue current medications  Labwork: None Ordered  Testing/Procedures: Your physician has requested that you have a lexiscan myoview. For further information please visit [email or web address]. Please follow instruction sheet, as given.  Follow-Up: Your physician recommends that you schedule a follow-up appointment in: Dr Sallyanne Kuster next available   Any Other Special Instructions Will Be Listed Below (If Applicable).   If you need a refill on your cardiac medications before your next appointment, please call your pharmacy.

## 2016-05-01 NOTE — Progress Notes (Signed)
Cardiology Office Note   Date:  05/01/2016   ID:  Mason [name], DOB 1941/07/01, MRN [id number]  PCP:  Tammi Sou, MD  Cardiologist:  Dr Sallyanne Kuster 05/2015  Mason [name], Mason [name] 03/2016  Chief Complaint  Patient presents with  . Follow-up    1 month; dizziness; occasionally. cramping in legs.     History of Present Illness: Mason [name] is a 75 y.o. 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 Seen 10/04 by Dr Anitra Lauth and given steroid dose-pak for R knee bursitis  Mason [name] presents for Cardiology follow-up management of his heart failure.  He feels that he has been doing well with the sodium restrictions on his diet. He feels that he is more stable now from a heart standpoint. He is getting out and about more, able to go around and work with his chicken and seems to be enjoying things more in general. His weight is down 7 pounds on our scales and it is down on his home scales as well. He is gratified by the success of the diuretic. His lower extremity edema has improved. He is not having orthopnea or PND anymore.  He is having some chest pain. It is not exertional. It frequently happens in the evenings, when he is resting in his recliner. He has not thought about whether or not it hums on after meals, but this is definitely a possibility. He has been on the Prilosec for a long time without a dose or medication change. He denies any blood in his stools or any problems with bowel movements.  He is compliant with his blood pressure medications. His blood pressure was initially elevated in the office today, but it was rechecked after he had been sitting several minutes, and was 132/80. It is possible we are checking blood pressures in a way that contributes to perceived poor blood pressure control.   Past  Medical History:  Diagnosis Date  . Arthritis    left shoulder (Dr. [name]): steroid injection 04/03/16.  Plan for TSA surgery 06/2016.  [name] BPH (benign prostatic hypertrophy) 06/2011   Irritative sx's; pt declined trial of anticholinergic per Urology records  . Chronic combined systolic and diastolic heart failure ([place]) 05/31/2012   EF 40-45%, LA mod-severe dilated, AFIB.   02/2016 EF 40%, diffuse hypokinesis, grade 2 DD.  [name] Chronic low back pain   . Chronic renal insufficiency, stage III (moderate) 2015   CrCl about 60 ml/min  . Depression   . Diabetes mellitus   . Episodic low back pain 01/22/2013   w/intermittent radiculitis (12/2014 his neurologist referred him to pain mgmt for epidural steroid injection)  . GERD (gastroesophageal reflux disease)   . H/O tilt table evaluation 11/02/05   negative  . Helicobacter pylori gastritis 01/2016  . History of adenomatous polyp of colon 10/12/11   Dr. [name] (3 right side of colon- tubular adenomas removed)  . History of cardiovascular stress test 05/28/12   no ischemia, EF 37%, imaging results are unchanged and within normal variance  . History of chronic prostatitis   . History of retinal detachment    OS  . History of vertigo    + Hx of posterior HA's.  Neuro (Dr. Erling Cruz) eval 2011.  Abnormal MRI: bicerebral small vessel  dz without brainstem involvement.  Congenitally small posterior circulation.  . Hypertension   . Lumbar spondylosis    lumbosacral radiculopathy at L4 by EMG testing, right foot drop (neurologist is Dr. Linus Salmons with [facility] in [place])--neurologist referred him to neurosurgery  . Nephrolithiasis 07/2012   Left UVJ 2 mm stone with dilation of renal collecting system and slight hydroureter on right  . 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  [name] Permanent atrial fibrillation ([place])    DCCV 07/09/13-converted, lasted two days, then back into afib--needs  lifetime anticoagulation (Xarelto as of 09/2014)  . Shortness of breath     Past Surgical History:  Procedure Laterality Date  . CARDIOVASCULAR STRESS TEST  2012   nuclear perfusion study: low risk scan  . CARDIOVERSION  07/09/2012   Procedure: CARDIOVERSION;  Surgeon: [name], MD;  Location: [facility] ENDOSCOPY;  Service: Cardiovascular;  Laterality: N/A;  . COLONOSCOPY W/ POLYPECTOMY  approx 2006; repeated 09/2011   Polyps on 2013 EGD as well, repeat 12/2014  . ESOPHAGOGASTRODUODENOSCOPY  10/18/06   Done due to chronic GERD: Normal, bx showed no barrett's esophagus (Dr. [name])  . EYE SURGERY     retinal detachment OS+ cataract surgery  . INSERT / REPLACE / REMOVE PACEMAKER  02/05/2012   dual chamber, sinus node dysfunction, sinus arrest, PAF, Medtronic Revo serial#[id number] H: last checked 05/2015  . LUMBAR LAMINECTOMY  1976   left L4-5  . PERMANENT PACEMAKER INSERTION N/A 02/05/2012   Procedure: PERMANENT PACEMAKER INSERTION;  Surgeon: [name], MD; Generator Medtronic [place] model [place] serial number [id number] H Laterality: N/A;  . TRANSTHORACIC ECHOCARDIOGRAM  08/25/10; 05/2012; 03/23/16   mild asymmetric LVH, normal systolic function, normal diastolic fxn, mild-to-mod mitral regurg, mild aortic valve sclerosis and trace AI, mild aortic root dilatation. 2014 f/u showed EF 40-45%, mod LAE, A FIB.  02/2016 EF 40%, diffuse hypokinesis, grade 2 DD.    Current Outpatient Prescriptions  Medication Sig Dispense Refill  . colchicine 0.6 MG tablet Take 1 tablet (0.6 mg total) by mouth daily. 15 tablet 0  . COMBIGAN 0.2-0.5 % ophthalmic solution     . furosemide (LASIX) 20 MG tablet Take 1 tablet (20 mg total) by mouth daily. FOR 5 DAYS ONLY. THEN 1 DAILY AS NEED FOR WEIGHT GAIN/SWELLING 30 tablet 2  . gabapentin (NEURONTIN) 300 MG capsule TAKE 1 CAPSULE THREE TIMES A DAY 270 capsule 3  . GLIPIZIDE XL 5 MG 24 hr tablet TAKE 1 TABLET EVERY MORNING 90 tablet 1  . glucose blood test strip Use as  instructed 100 each 3  . JANUVIA 100 MG tablet TAKE 1 TABLET DAILY 90 tablet 1  . Lancets (PRECISION ULTRA LANCET) MISC Use as instructed 100 each 3  . latanoprost (XALATAN) 0.005 % ophthalmic solution Place 1 drop into both eyes at bedtime. Instill 1 drop in each eye at bedtime  12  . losartan (COZAAR) 100 MG tablet TAKE 1 TABLET DAILY 90 tablet 1  . metFORMIN (GLUCOPHAGE) 1000 MG tablet TAKE 1 TABLET TWICE A DAY 180 tablet 1  . metoprolol succinate (TOPROL-XL) 100 MG 24 hr tablet 1 and 1/2 tabs po qd 135 tablet 3  . omeprazole (PRILOSEC) 40 MG capsule Take 1 capsule (40 mg total) by mouth daily. (Patient not taking: Reported on 04/26/2016) 90 capsule 3  . potassium chloride (K-DUR) 10 MEQ tablet Take 1 tablet (10 mEq total) by mouth daily. FOR 5 DAYS ONLY. THEN 1 DAILY AS NEED FOR  WEIGHT GAIN/SWELLING (Patient not taking: Reported on 04/26/2016) 30 tablet 2  . predniSONE (DELTASONE) 20 MG tablet 2 tabs po qd x 5d, then 1 tab po qd x 5d, then 1/2 tab po qd x 6d 18 tablet 0  . PYLERA 140-125-125 MG capsule     . rOPINIRole (REQUIP) 1 MG tablet TAKE 1 TABLET DAILY (NEED OFFICE VISIT FOR MORE REFILLS) 90 tablet 1  . sertraline (ZOLOFT) 100 MG tablet TAKE 1 TABLET DAILY 90 tablet 1  . simvastatin (ZOCOR) 20 MG tablet TAKE 1 TABLET EVERY EVENING 90 tablet 1  . XARELTO 20 MG TABS tablet TAKE 1 TABLET DAILY WITH SUPPER 90 tablet 1   No current facility-administered medications for this visit.     Allergies:   Review of patient's allergies indicates no known allergies.    Social History:  The patient  reports that he has never smoked. He has never used smokeless tobacco. He reports that he drinks about 7.2 oz of alcohol per week . He reports that he does not use drugs.   Family History:  The patient's family history includes Heart failure in his mother; Stroke in his father and mother.    ROS:  Please see the history of present illness. All other systems are reviewed and negative.    PHYSICAL  EXAM: VS:  BP (!) 159/100   Pulse 85   Ht 5\' 11"  (1.803 m)   Wt 222 lb 12.8 oz (101.1 kg)   BMI 31.07 kg/m  , BMI Body mass index is 31.07 kg/m. GEN: Well nourished, well developed, male in no acute distress  HEENT: normal for age  Neck: no JVD, no carotid bruit, no masses Cardiac: RRR; no murmur, no rubs, or gallops Respiratory:  clear to auscultation bilaterally, normal work of breathing GI: soft, nontender, nondistended, + BS MS: no deformity or atrophy; no edema; distal pulses are 2+ in all 4 extremities   Skin: warm and dry, no rash Neuro:  Strength and sensation are intact Psych: euthymic mood, full affect   EKG:  EKG is ordered today. The ekg ordered today demonstrates sinus rhythm, ventricular pacing, heart rate 85  ECHO: 03/23/2016 - Left ventricle: The cavity size was normal. Wall thickness was   increased in a pattern of mild LVH. The estimated ejection   fraction was 40%. Diffuse hypokinesis. Features are consistent   with a pseudonormal left ventricular filling pattern, with   concomitant abnormal relaxation and increased filling pressure   (grade 2 diastolic dysfunction). - Aortic valve: There was no stenosis. - Aorta: Ascending aortic diameter: 43 mm (S). - Ascending aorta: The ascending aorta was mildly dilated. - Mitral valve: There was trivial regurgitation. - Left atrium: The atrium was mildly dilated. - Right ventricle: The cavity size was normal. Pacer wire or   catheter noted in right ventricle. Systolic function was mildly reduced. - Right atrium: The atrium was mildly dilated. - Tricuspid valve: Peak RV-RA gradient (S): 26 mm Hg. - Pulmonary arteries: PA peak pressure: 34 mm Hg (S). - Systemic veins: IVC measured 2.0 cm with < 50% respirophasic   variation, suggesting RA pressure 8 mmHg. Impressions: - Normal LV size with mild LV hypertrophy. EF 40%, diffuse   hypokinesis. Moderate diastolic dysfunction. Normal RV size with   mildly decreased  systolic function.   Recent Labs: 02/21/2016: ALT 17; Hemoglobin 14.1; Platelets 159 04/07/2016: BUN 25; Creat 1.42; Potassium 4.4; Sodium 139    Lipid Panel    Component Value Date/Time  CHOL 128 09/20/2015 0946   TRIG 109.0 09/20/2015 0946   HDL 43.80 09/20/2015 0946   CHOLHDL 3 09/20/2015 0946   VLDL 21.8 09/20/2015 0946   LDLCALC 63 09/20/2015 0946     Wt Readings from Last 3 Encounters:  05/01/16 222 lb 12.8 oz (101.1 kg)  04/26/16 229 lb 12.8 oz (104.2 kg)  03/30/16 230 lb (104.3 kg)     Other studies Reviewed: Additional studies/ records that were reviewed today include: Office notes, hospital records and testing.  ASSESSMENT AND PLAN:  1.  Chest pain: Symptoms are atypical but he has not had a recent ischemic evaluation. He has a history of cardiomyopathy, so decreased EF is not indicative of a new problem. We will schedule a Lexi scan Myoview, further evaluation and treatment depending on the results. If that is low risk, consider more aggressive reflux medications and management, follow-up with primary care.  2. Chronic combined systolic and diastolic CHF: His weight is down 7 pounds and his symptoms have improved. I believe that dietary compliance is also improved. He is encouraged to keep track of his weights and his symptoms. He is to continue with the when necessary Lasix.  3. Mr. Hoxit believes that his blood pressure is not as high at home as it was here today. This was supported by recheck of his blood pressure that showed his blood pressure is indeed much lower than it appeared when he was first checked. He is to track this more carefully and advise if his blood pressure is indeed running as high at home as it is today. For now, no med changes.   Current medicines are reviewed at length with the patient today.  The patient does not have concerns regarding medicines.  The following changes have been made:  no change  Labs/ tests ordered today include:    Orders Placed This Encounter  Procedures  . Myocardial Perfusion Imaging  . EKG 12-Lead     Disposition:   FU with Dr. Sallyanne Kuster  Signed, Mason Ferries, PA-C  05/01/2016 4:35 PM    [place] Phone: [phone]; Fax: [phone]  This note was written with the assistance of speech recognition software. Please excuse any transcriptional errors.

## 2016-05-04 ENCOUNTER — Telehealth: Payer: Self-pay

## 2016-05-04 ENCOUNTER — Encounter: Payer: Self-pay | Admitting: Cardiovascular Disease

## 2016-05-04 NOTE — Telephone Encounter (Signed)
Sent via [employer]. Recommended that they wait until after his follow-up visit on November 7. We will need [employer] rep to be present at the time of his surgery.

## 2016-05-04 NOTE — Telephone Encounter (Signed)
Request for surgical clearance:   1. What type surgery is being performed? Total joint replacement of left shoulder  2. When is this surgery scheduled? pending  3. Are there any medications that need to be held prior to surgery and how long? Xarelto 20 mg  4. Name of the physician performing surgery: [place], DO  5. What is the office phone and fax number?   Phone [phone]  Fax [phone]

## 2016-05-05 ENCOUNTER — Telehealth (HOSPITAL_COMMUNITY): Payer: Self-pay

## 2016-05-05 NOTE — Telephone Encounter (Signed)
Encounter complete. 

## 2016-05-05 NOTE — Telephone Encounter (Signed)
Faxed clearance letter to [facility] [place] yesterday, 05/05/16.

## 2016-05-09 ENCOUNTER — Telehealth (HOSPITAL_COMMUNITY): Payer: Self-pay

## 2016-05-09 NOTE — Telephone Encounter (Signed)
Encounter complete. 

## 2016-05-10 ENCOUNTER — Ambulatory Visit (HOSPITAL_COMMUNITY)
Admission: RE | Admit: 2016-05-10 | Discharge: 2016-05-10 | Disposition: A | Discharge status: Home / Self Care | Payer: Medicare Other | Source: Ambulatory Visit | Attending: Cardiology | Admitting: Cardiology

## 2016-05-10 DIAGNOSIS — R0600 Dyspnea, unspecified: Secondary | ICD-10-CM

## 2016-05-10 DIAGNOSIS — R931 Abnormal findings on diagnostic imaging of heart and coronary circulation: Secondary | ICD-10-CM | POA: Insufficient documentation

## 2016-05-10 DIAGNOSIS — R0609 Other forms of dyspnea: Secondary | ICD-10-CM

## 2016-05-10 LAB — MYOCARDIAL PERFUSION IMAGING
LV dias vol: 160 mL (ref 62–150)
LV sys vol: 109 mL
Peak HR: 77 {beats}/min
Rest HR: 72 {beats}/min
SDS: 0
SRS: 1
SSS: 1
TID: 1.1

## 2016-05-10 MED ORDER — TECHNETIUM TC 99M TETROFOSMIN IV KIT
10.7000 | PACK | Freq: Once | INTRAVENOUS | Status: AC | PRN
  Administered 2016-05-10: 10.7 via INTRAVENOUS
  Filled 2016-05-10: qty 11

## 2016-05-10 MED ORDER — REGADENOSON 0.4 MG/5ML IV SOLN
0.4000 mg | Freq: Once | INTRAVENOUS | Status: AC
  Administered 2016-05-10: 0.4 mg via INTRAVENOUS

## 2016-05-10 MED ORDER — TECHNETIUM TC 99M TETROFOSMIN IV KIT
30.9000 | PACK | Freq: Once | INTRAVENOUS | Status: AC | PRN
  Administered 2016-05-10: 30.9 via INTRAVENOUS
  Filled 2016-05-10: qty 31

## 2016-05-19 ENCOUNTER — Ambulatory Visit: Payer: Medicare Other

## 2016-05-26 DIAGNOSIS — L821 Other seborrheic keratosis: Secondary | ICD-10-CM | POA: Diagnosis not present

## 2016-05-26 DIAGNOSIS — D225 Melanocytic nevi of trunk: Secondary | ICD-10-CM | POA: Diagnosis not present

## 2016-05-26 DIAGNOSIS — L570 Actinic keratosis: Secondary | ICD-10-CM | POA: Diagnosis not present

## 2016-05-26 DIAGNOSIS — X32XXXD Exposure to sunlight, subsequent encounter: Secondary | ICD-10-CM | POA: Diagnosis not present

## 2016-05-30 ENCOUNTER — Encounter: Payer: Self-pay | Admitting: Cardiovascular Disease

## 2016-05-30 ENCOUNTER — Ambulatory Visit (INDEPENDENT_AMBULATORY_CARE_PROVIDER_SITE_OTHER): Payer: Medicare Other | Admitting: Cardiovascular Disease

## 2016-05-30 VITALS — BP 147/98 | HR 75 | Ht 71.0 in | Wt 231.6 lb

## 2016-05-30 DIAGNOSIS — I5042 Chronic combined systolic (congestive) and diastolic (congestive) heart failure: Secondary | ICD-10-CM | POA: Diagnosis not present

## 2016-05-30 DIAGNOSIS — R5383 Other fatigue: Secondary | ICD-10-CM | POA: Diagnosis not present

## 2016-05-30 DIAGNOSIS — D689 Coagulation defect, unspecified: Secondary | ICD-10-CM

## 2016-05-30 DIAGNOSIS — I10 Essential (primary) hypertension: Secondary | ICD-10-CM

## 2016-05-30 DIAGNOSIS — Z950 Presence of cardiac pacemaker: Secondary | ICD-10-CM | POA: Diagnosis not present

## 2016-05-30 DIAGNOSIS — I4430 Unspecified atrioventricular block: Secondary | ICD-10-CM

## 2016-05-30 DIAGNOSIS — I482 Chronic atrial fibrillation: Secondary | ICD-10-CM

## 2016-05-30 DIAGNOSIS — Z01818 Encounter for other preprocedural examination: Secondary | ICD-10-CM

## 2016-05-30 DIAGNOSIS — E118 Type 2 diabetes mellitus with unspecified complications: Secondary | ICD-10-CM

## 2016-05-30 DIAGNOSIS — I4821 Permanent atrial fibrillation: Secondary | ICD-10-CM

## 2016-05-30 NOTE — Patient Instructions (Signed)
Dr Sallyanne Kuster has requested that you have a left cardiac catheterization with an interventionalist. Cardiac catheterization is used to diagnose and/or treat various heart conditions. Doctors may recommend this procedure for a number of different reasons. The most common reason is to evaluate chest pain. Chest pain can be a symptom of coronary artery disease (CAD), and cardiac catheterization can show whether plaque is narrowing or blocking your heart's arteries. This procedure is also used to evaluate the valves, as well as measure the blood flow and oxygen levels in different parts of your heart. For further information please visit [email or web address].   Following your catheterization, you will not be allowed to drive for 3 days.  No lifting, pushing, or pulling greater that 10 pounds is allowed for 1 week.  You will be required to have the following tests prior to the procedure:  1. Blood work - the blood work can be done no more than 7 days prior to the procedure. It can be done at any [facility] lab. There is one downstairs on the first floor of this building and one in the [facility] building [id number]).   Please HOLD the following medications: >>Losartan >>Metformin >>Furosemide

## 2016-05-30 NOTE — Progress Notes (Addendum)
Cardiology Office Note    Date:  05/30/2016   ID:  Mason [name], DOB 12-16-1940, MRN [id number]  PCP:  Tammi Sou, MD  Cardiologist:   [name], MD   Chief Complaint  Patient presents with  . Shortness of Breath    pt c/o SOB, for the past couple of months    History of Present Illness:  Mason [name] is a 75 y.o. 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 He is now taking a low dose of loop diuretic for the last month or so and this has led to improved exertional dyspnea. In August his echocardiogram showed left ventricular ejection fraction that had dropped to 40%, with diffuse hypokinesis. In October his nuclear stress test showed a normal pattern of myocardial perfusion, but the EF was down to 32%.  His dual-chamber Medtronic Revo pacemaker was implanted in July 2013 but is now programmed VVIR for permanent atrial fibrillation. He has 100% atrial fibrillation and 100% ventricular pacing. Despite the abnormalities described above he is extremely active (6.4 hours/day as measured by his pacemaker). Battery voltage is 2.93 V/RRT 2.81 V) He is on chronic anticoagulation with Xarelto and denies either embolic or bleeding problems.  He does not have known coronary or other vascular disorders, but has numerous risk factors including type 2 diabetes, hyperlipidemia, mild obesity. He has a history of gout.  He is planning left shoulder replacement surgery in December 2017 (Dr. Creig [name]). His pacemaker is located in the left subclavian region and will require VOO reprogramming before he has shoulder operation.    Past Medical History:  Diagnosis Date  . Arthritis    left shoulder (Dr. [name]): steroid injection 04/03/16.  Plan for TSA surgery 06/2016.  [name] BPH (benign prostatic hypertrophy) 06/2011   Irritative sx's; pt declined trial of anticholinergic per Urology records  . Chronic combined systolic and diastolic heart failure ([place]) 05/31/2012   EF 40-45%, LA mod-severe dilated, AFIB.   02/2016 EF 40%, diffuse hypokinesis, grade 2 DD.  [name] Chronic low back pain   . Chronic renal insufficiency, stage III (moderate) 2015   CrCl about 60 ml/min  . Depression   . Diabetes mellitus   . Episodic low back pain 01/22/2013   w/intermittent radiculitis (12/2014 his neurologist referred him to pain mgmt for epidural steroid injection)  . GERD (gastroesophageal reflux disease)   . H/O tilt table evaluation 11/02/05   negative  . Helicobacter pylori gastritis 01/2016  . History of adenomatous polyp of colon 10/12/11   Dr. [name] (3 right side of colon- tubular adenomas removed)  . History of cardiovascular stress test 05/28/12   no ischemia, EF 37%, imaging results are unchanged and within normal variance  . History of chronic prostatitis   . History of retinal detachment    OS  . History of vertigo    + Hx of posterior HA's.  Neuro (Dr. Erling Cruz) eval 2011.  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  . Hypertension   . Lumbar spondylosis    lumbosacral radiculopathy at L4 by EMG testing, right foot drop (neurologist is Dr. Linus Salmons with [facility] in [place])--neurologist referred him to neurosurgery  . Nephrolithiasis 07/2012   Left UVJ 2 mm stone with dilation of renal collecting system and slight hydroureter on right  . 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  [name] Permanent atrial fibrillation ([place])    DCCV 07/09/13-converted, lasted two days, then back into afib--needs lifetime anticoagulation (Xarelto as of 09/2014)  . Shortness of breath     Past Surgical History:  Procedure Laterality Date  . CARDIOVASCULAR STRESS TEST  2012   nuclear perfusion study:  low risk scan  . CARDIOVERSION  07/09/2012   Procedure: CARDIOVERSION;  Surgeon: [name], MD;  Location: [facility] ENDOSCOPY;  Service: Cardiovascular;  Laterality: N/A;  . COLONOSCOPY W/ POLYPECTOMY  approx 2006; repeated 09/2011   Polyps on 2013 EGD as well, repeat 12/2014  . ESOPHAGOGASTRODUODENOSCOPY  10/18/06   Done due to chronic GERD: Normal, bx showed no barrett's esophagus (Dr. [name])  . EYE SURGERY     retinal detachment OS+ cataract surgery  . INSERT / REPLACE / REMOVE PACEMAKER  02/05/2012   dual chamber, sinus node dysfunction, sinus arrest, PAF, Medtronic Revo serial#[id number] H: last checked 05/2015  . LUMBAR LAMINECTOMY  1976   left L4-5  . PERMANENT PACEMAKER INSERTION N/A 02/05/2012   Procedure: PERMANENT PACEMAKER INSERTION;  Surgeon: [name], MD; Generator Medtronic [place] model [place] serial number [id number] H Laterality: N/A;  . TRANSTHORACIC ECHOCARDIOGRAM  08/25/10; 05/2012; 03/23/16   mild asymmetric LVH, normal systolic function, normal diastolic fxn, mild-to-mod mitral regurg, mild aortic valve sclerosis and trace AI, mild aortic root dilatation. 2014 f/u showed EF 40-45%, mod LAE, A FIB.  02/2016 EF 40%, diffuse hypokinesis, grade 2 DD.    Current Medications: Outpatient Medications Prior to Visit  Medication Sig Dispense Refill  . colchicine 0.6 MG tablet Take 1 tablet (0.6 mg total) by mouth daily. 15 tablet 0  . COMBIGAN 0.2-0.5 % ophthalmic solution     . furosemide (LASIX) 20 MG tablet Take 1 tablet (20 mg total) by mouth daily. FOR 5 DAYS ONLY. THEN 1 DAILY AS NEED FOR WEIGHT GAIN/SWELLING 30 tablet 2  . gabapentin (NEURONTIN) 300 MG capsule TAKE 1 CAPSULE THREE TIMES A DAY 270 capsule 3  . GLIPIZIDE XL 5 MG 24 hr tablet TAKE 1 TABLET EVERY MORNING 90 tablet 1  . glucose blood test strip Use as instructed 100 each 3  . JANUVIA 100 MG tablet TAKE 1 TABLET DAILY 90 tablet 1  . Lancets (PRECISION ULTRA LANCET) MISC Use as instructed 100 each 3  . latanoprost  (XALATAN) 0.005 % ophthalmic solution Place 1 drop into both eyes at bedtime. Instill 1 drop in each eye at bedtime  12  . losartan (COZAAR) 100 MG tablet TAKE 1 TABLET DAILY 90 tablet 1  . metFORMIN (GLUCOPHAGE) 1000 MG tablet TAKE 1 TABLET TWICE A DAY 180 tablet 1  . metoprolol succinate (TOPROL-XL) 100 MG 24 hr tablet 1 and 1/2 tabs po qd 135 tablet 3  . omeprazole (PRILOSEC) 40 MG capsule Take 1 capsule (40 mg total) by mouth daily. 90 capsule 3  . potassium chloride (K-DUR) 10 MEQ tablet Take 1 tablet (10 mEq total) by mouth daily. FOR 5 DAYS ONLY. THEN 1 DAILY AS NEED FOR WEIGHT GAIN/SWELLING 30 tablet 2  . predniSONE (DELTASONE) 20 MG tablet 2 tabs po qd x 5d, then 1 tab po qd x 5d, then 1/2 tab po qd x 6d 18 tablet 0  . PYLERA 140-125-125 MG capsule     . rOPINIRole (REQUIP) 1 MG tablet TAKE 1 TABLET DAILY (NEED OFFICE VISIT FOR MORE REFILLS) 90 tablet 1  . sertraline (ZOLOFT) 100 MG tablet TAKE 1 TABLET  DAILY 90 tablet 1  . simvastatin (ZOCOR) 20 MG tablet TAKE 1 TABLET EVERY EVENING 90 tablet 1  . XARELTO 20 MG TABS tablet TAKE 1 TABLET DAILY WITH SUPPER 90 tablet 1   No facility-administered medications prior to visit.      Allergies:   Patient has no known allergies.   Social History   Social History  . Marital status: Married    Spouse name: N/A  . Number of children: N/A  . Years of education: N/A   Social History Main Topics  . Smoking status: Never Smoker  . Smokeless tobacco: Never Used  . Alcohol use 7.2 oz/week    12 Cans of beer per week     Comment: 12 beers per week  . Drug use: No  . Sexual activity: Not on file   Other Topics Concern  . Not on file   Social History Narrative  . No narrative on file     Family History:  The patient's family history includes Heart failure in his mother; Stroke in his father and mother.   ROS:   Please see the history of present illness.    ROS All other systems reviewed and are negative.   PHYSICAL EXAM:   VS:   BP (!) 147/98   Pulse 75   Ht 5\' 11"  (1.803 m)   Wt 231 lb 9.6 oz (105.1 kg)   BMI 32.30 kg/m    GEN: Well nourished, well developed, in no acute distress  HEENT: normal  Neck: no JVD, carotid bruits, or masses Cardiac: RRR, paradoxically split S2; no murmurs, rubs, or gallops,no edema  Respiratory:  clear to auscultation bilaterally, normal work of breathing GI: soft, nontender, nondistended, + BS MS: no deformity or atrophy  Skin: warm and dry, no rash Neuro:  Alert and Oriented x 3, Strength and sensation are intact Psych: euthymic mood, full affect  Wt Readings from Last 3 Encounters:  05/30/16 231 lb 9.6 oz (105.1 kg)  05/10/16 222 lb (100.7 kg)  05/01/16 222 lb 12.8 oz (101.1 kg)      Studies/Labs Reviewed:   EKG:  EKG is ordered today.  The ekg ordered today demonstrates 100% ventricular pacing on a back on of atrial fibrillation  Recent Labs: 02/21/2016: ALT 17; Hemoglobin 14.1; Platelets 159 04/07/2016: BUN 25; Creat 1.42; Potassium 4.4; Sodium 139   Lipid Panel    Component Value Date/Time   CHOL 128 09/20/2015 0946   TRIG 109.0 09/20/2015 0946   HDL 43.80 09/20/2015 0946   CHOLHDL 3 09/20/2015 0946   VLDL 21.8 09/20/2015 0946   LDLCALC 63 09/20/2015 0946    Additional studies/ records that were reviewed today include:  Echo and nuclear images    ASSESSMENT:    1. Chronic combined systolic and diastolic heart failure (HCC)   2. Cardiac pacemaker   3. AVB (atrioventricular block)   4. Permanent atrial fibrillation ([place])   5. Diabetes mellitus with complication ([employer])   6. HTN (hypertension), benign   7. Pre-op examination   8. Coagulation disorder ([place])   9. Fatigue, unspecified type      PLAN:  In order of problems listed above:  1. CHF: I'm quite concerned about the steady decline in left ventricular systolic function, especially that now he has symptoms of heart failure. The drop in LVEF is more than one would expect from RV pacing related  dyssynchrony. He has numerous risk factors for coronary artery disease and I recommend that he have coronary  angiography. Even in the absence of angina pectoris, I think we need to exclude "balanced ischemia" such as from a stenotic dominant left coronary artery. 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 Discussed left heart catheterization. Will be scheduled with Dr. [place] on November 22. This procedure has been fully reviewed with the patient and written informed consent has been obtained. Hold metformin, furosemide, losartan on the morning of the catheterization. Hold Xarelto 48 hours before catheterization. 2. 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 I think it is safest if we consider him to be pacemaker dependent. If CAD is not identified, I would refer him for upgrade to CRT-P. he is already on relatively high doses of beta blocker and angiotensin receptor blocker. 3. AFib: Permanent. Should not interfere with cardiac resynchronization since he has progressed to complete heart block. Is compliant with anticoagulation. Hold Xarelto for heart cath. 4. PPM: Normal device function. Option for upgrade to BiV device if he does not have need for coronary revascularization. 5. Obesity/metabolic syndrome/diabetes mellitus/low HDL: LDL is in desirable range and he has good glycemic control. He is very physically active. Kidneys to focus on losing weight by mouth caloric/carbohydrate restriction. 6. HTN: consider Entresto instead of losartan. Change after heart catheterization. 7. Preop risk eval: Recommend that he have cardiac catheterization first, but unless he needs percutaneous revascularization, this should not lead to any significant delays in his plan shoulder procedure. Even if he needs bypass surgery, it is conceivable that he could still have his shoulder procedure in late December with Dr. Creig [name]  as scheduled. I would recommend that shoulder replacement and the necessary orthopedic rehabilitation be performed before upgrade him to a biventricular device, to avoid the risk of left ventricular lead dislodgment with shoulder manipulation. Since he is virtually pacemaker dependent and the pacemaker is located in the left subclavian area, will have to reprogram his pacemaker to VOO setting before shoulder surgery.    Medication Adjustments/Labs and Tests Ordered: Current medicines are reviewed at length with the patient today.  Concerns regarding medicines are outlined above.  Medication changes, Labs and Tests ordered today are listed in the Patient Instructions below. Patient Instructions  Dr Sallyanne Kuster has requested that you have a left cardiac catheterization with an interventionalist. Cardiac catheterization is used to diagnose and/or treat various heart conditions. Doctors may recommend this procedure for a number of different reasons. The most common reason is to evaluate chest pain. Chest pain can be a symptom of coronary artery disease (CAD), and cardiac catheterization can show whether plaque is narrowing or blocking your heart's arteries. This procedure is also used to evaluate the valves, as well as measure the blood flow and oxygen levels in different parts of your heart. For further information please visit [email or web address].   Following your catheterization, you will not be allowed to drive for 3 days.  No lifting, pushing, or pulling greater that 10 pounds is allowed for 1 week.  You will be required to have the following tests prior to the procedure:  1. Blood work - the blood work can be done no more than 7 days prior to the procedure. It can be done at any [facility] lab. There is one downstairs on the first floor of this building and one in the [facility] building [phone]).   Please HOLD the following  medications: >>Losartan >>Metformin >>Furosemide  Signed, [name], MD  05/30/2016 4:17 PM    [facility] [address], [place], [place]  [address] Phone: [phone]; Fax: [phone]

## 2016-05-31 ENCOUNTER — Other Ambulatory Visit: Payer: Self-pay | Admitting: Family Medicine

## 2016-06-02 LAB — CUP PACEART INCLINIC DEVICE CHECK
Battery Voltage: 2.93 V
Brady Statistic RV Percent Paced: 99.94 %
Date Time Interrogation Session: 20171107154735
Implantable Lead Implant Date: 20130715
Implantable Lead Implant Date: 20130715
Implantable Lead Location: 753859
Implantable Lead Location: 753860
Implantable Lead Model: 5086
Implantable Lead Model: 5086
Implantable Pulse Generator Implant Date: 20130715
Lead Channel Impedance Value: 400 Ohm
Lead Channel Impedance Value: 536 Ohm
Lead Channel Setting Pacing Amplitude: 2.5 V
Lead Channel Setting Pacing Pulse Width: 0.4 ms
Lead Channel Setting Sensing Sensitivity: 0.9 mV

## 2016-06-06 DIAGNOSIS — D689 Coagulation defect, unspecified: Secondary | ICD-10-CM | POA: Diagnosis not present

## 2016-06-06 DIAGNOSIS — I5042 Chronic combined systolic (congestive) and diastolic (congestive) heart failure: Secondary | ICD-10-CM | POA: Diagnosis not present

## 2016-06-06 DIAGNOSIS — R5383 Other fatigue: Secondary | ICD-10-CM | POA: Diagnosis not present

## 2016-06-06 DIAGNOSIS — Z01818 Encounter for other preprocedural examination: Secondary | ICD-10-CM | POA: Diagnosis not present

## 2016-06-06 LAB — CBC
HCT: 42.6 % (ref 38.5–50.0)
Hemoglobin: 14.3 g/dL (ref 13.2–17.1)
MCH: 30.5 pg (ref 27.0–33.0)
MCHC: 33.6 g/dL (ref 32.0–36.0)
MCV: 90.8 fL (ref 80.0–100.0)
MPV: 10.9 fL (ref 7.5–12.5)
Platelets: 177 10*3/uL (ref 140–400)
RBC: 4.69 MIL/uL (ref 4.20–5.80)
RDW: 13.8 % (ref 11.0–15.0)
WBC: 5.4 10*3/uL (ref 3.8–10.8)

## 2016-06-07 LAB — BASIC METABOLIC PANEL
BUN: 16 mg/dL (ref 7–25)
CO2: 28 mmol/L (ref 20–31)
Calcium: 9.2 mg/dL (ref 8.6–10.3)
Chloride: 101 mmol/L (ref 98–110)
Creat: 1.21 mg/dL — ABNORMAL HIGH (ref 0.70–1.18)
Glucose, Bld: 210 mg/dL — ABNORMAL HIGH (ref 65–99)
Potassium: 4.4 mmol/L (ref 3.5–5.3)
Sodium: 137 mmol/L (ref 135–146)

## 2016-06-07 LAB — PROTIME-INR
INR: 1.3 — ABNORMAL HIGH
Prothrombin Time: 13.8 s — ABNORMAL HIGH (ref 9.0–11.5)

## 2016-06-07 LAB — APTT: aPTT: 30 s (ref 22–34)

## 2016-06-07 LAB — TSH: TSH: 3.37 mIU/L (ref 0.40–4.50)

## 2016-06-09 ENCOUNTER — Ambulatory Visit: Payer: Medicare Other

## 2016-06-11 ENCOUNTER — Encounter: Payer: Self-pay | Admitting: Family Medicine

## 2016-06-14 ENCOUNTER — Encounter: Payer: Self-pay | Admitting: Cardiovascular Disease

## 2016-06-14 ENCOUNTER — Encounter (HOSPITAL_COMMUNITY): Payer: Self-pay | Admitting: Cardiology

## 2016-06-14 ENCOUNTER — Encounter (HOSPITAL_COMMUNITY)
Admission: RE | Disposition: A | Discharge status: Home / Self Care | Payer: Self-pay | Source: Ambulatory Visit | Attending: Cardiology

## 2016-06-14 ENCOUNTER — Ambulatory Visit: Payer: Medicare Other | Admitting: Family Medicine

## 2016-06-14 ENCOUNTER — Ambulatory Visit (HOSPITAL_COMMUNITY)
Admission: RE | Admit: 2016-06-14 | Discharge: 2016-06-14 | Disposition: A | Discharge status: Home / Self Care | Payer: Medicare Other | Source: Ambulatory Visit | Attending: Cardiology | Admitting: Cardiology

## 2016-06-14 DIAGNOSIS — M109 Gout, unspecified: Secondary | ICD-10-CM | POA: Insufficient documentation

## 2016-06-14 DIAGNOSIS — E1122 Type 2 diabetes mellitus with diabetic chronic kidney disease: Secondary | ICD-10-CM | POA: Insufficient documentation

## 2016-06-14 DIAGNOSIS — I2510 Atherosclerotic heart disease of native coronary artery without angina pectoris: Secondary | ICD-10-CM | POA: Diagnosis not present

## 2016-06-14 DIAGNOSIS — M19012 Primary osteoarthritis, left shoulder: Secondary | ICD-10-CM | POA: Insufficient documentation

## 2016-06-14 DIAGNOSIS — E669 Obesity, unspecified: Secondary | ICD-10-CM | POA: Insufficient documentation

## 2016-06-14 DIAGNOSIS — N183 Chronic kidney disease, stage 3 (moderate): Secondary | ICD-10-CM | POA: Diagnosis not present

## 2016-06-14 DIAGNOSIS — I130 Hypertensive heart and chronic kidney disease with heart failure and stage 1 through stage 4 chronic kidney disease, or unspecified chronic kidney disease: Secondary | ICD-10-CM | POA: Insufficient documentation

## 2016-06-14 DIAGNOSIS — K219 Gastro-esophageal reflux disease without esophagitis: Secondary | ICD-10-CM | POA: Diagnosis not present

## 2016-06-14 DIAGNOSIS — E785 Hyperlipidemia, unspecified: Secondary | ICD-10-CM | POA: Diagnosis not present

## 2016-06-14 DIAGNOSIS — Z79899 Other long term (current) drug therapy: Secondary | ICD-10-CM | POA: Insufficient documentation

## 2016-06-14 DIAGNOSIS — I519 Heart disease, unspecified: Secondary | ICD-10-CM | POA: Diagnosis present

## 2016-06-14 DIAGNOSIS — Z7984 Long term (current) use of oral hypoglycemic drugs: Secondary | ICD-10-CM | POA: Diagnosis not present

## 2016-06-14 DIAGNOSIS — I482 Chronic atrial fibrillation: Secondary | ICD-10-CM | POA: Diagnosis not present

## 2016-06-14 DIAGNOSIS — I5042 Chronic combined systolic (congestive) and diastolic (congestive) heart failure: Secondary | ICD-10-CM | POA: Diagnosis not present

## 2016-06-14 DIAGNOSIS — I442 Atrioventricular block, complete: Secondary | ICD-10-CM | POA: Diagnosis not present

## 2016-06-14 DIAGNOSIS — I4891 Unspecified atrial fibrillation: Secondary | ICD-10-CM | POA: Diagnosis present

## 2016-06-14 DIAGNOSIS — E1169 Type 2 diabetes mellitus with other specified complication: Secondary | ICD-10-CM

## 2016-06-14 DIAGNOSIS — Z6832 Body mass index (BMI) 32.0-32.9, adult: Secondary | ICD-10-CM | POA: Diagnosis not present

## 2016-06-14 DIAGNOSIS — Z950 Presence of cardiac pacemaker: Secondary | ICD-10-CM | POA: Diagnosis not present

## 2016-06-14 DIAGNOSIS — I10 Essential (primary) hypertension: Secondary | ICD-10-CM | POA: Diagnosis present

## 2016-06-14 DIAGNOSIS — Z7901 Long term (current) use of anticoagulants: Secondary | ICD-10-CM | POA: Diagnosis not present

## 2016-06-14 HISTORY — PX: CARDIAC CATHETERIZATION: SHX172

## 2016-06-14 LAB — GLUCOSE, CAPILLARY
Glucose-Capillary: 153 mg/dL — ABNORMAL HIGH (ref 65–99)
Glucose-Capillary: 175 mg/dL — ABNORMAL HIGH (ref 65–99)

## 2016-06-14 SURGERY — LEFT HEART CATH AND CORONARY ANGIOGRAPHY
Anesthesia: LOCAL

## 2016-06-14 MED ORDER — MIDAZOLAM HCL 2 MG/2ML IJ SOLN
INTRAMUSCULAR | Status: AC
  Filled 2016-06-14: qty 2

## 2016-06-14 MED ORDER — SODIUM CHLORIDE 0.9 % WEIGHT BASED INFUSION: 1.0000 mL/kg/h | INTRAVENOUS | Status: DC

## 2016-06-14 MED ORDER — SODIUM CHLORIDE 0.9% FLUSH: 3.0000 mL | Freq: Two times a day (BID) | INTRAVENOUS | Status: DC

## 2016-06-14 MED ORDER — SODIUM CHLORIDE 0.9 % IV SOLN: 250.0000 mL | INTRAVENOUS | Status: DC | PRN

## 2016-06-14 MED ORDER — IOPAMIDOL (ISOVUE-370) INJECTION 76%
INTRAVENOUS | Status: DC | PRN
  Administered 2016-06-14: 85 mL

## 2016-06-14 MED ORDER — ASPIRIN 81 MG PO CHEW
CHEWABLE_TABLET | ORAL | Status: AC
  Administered 2016-06-14: 81 mg via ORAL
  Filled 2016-06-14: qty 1

## 2016-06-14 MED ORDER — VERAPAMIL HCL 2.5 MG/ML IV SOLN
INTRAVENOUS | Status: DC | PRN
  Administered 2016-06-14: 10 mL via INTRA_ARTERIAL

## 2016-06-14 MED ORDER — MIDAZOLAM HCL 2 MG/2ML IJ SOLN
INTRAMUSCULAR | Status: DC | PRN
  Administered 2016-06-14: 1 mg via INTRAVENOUS

## 2016-06-14 MED ORDER — FENTANYL CITRATE (PF) 100 MCG/2ML IJ SOLN
INTRAMUSCULAR | Status: DC | PRN
  Administered 2016-06-14: 25 ug via INTRAVENOUS

## 2016-06-14 MED ORDER — ASPIRIN 81 MG PO CHEW
81.0000 mg | CHEWABLE_TABLET | ORAL | Status: AC
  Administered 2016-06-14: 81 mg via ORAL

## 2016-06-14 MED ORDER — HEPARIN (PORCINE) IN NACL 2-0.9 UNIT/ML-% IJ SOLN
INTRAMUSCULAR | Status: DC | PRN
  Administered 2016-06-14: 09:00:00

## 2016-06-14 MED ORDER — HEPARIN (PORCINE) IN NACL 2-0.9 UNIT/ML-% IJ SOLN
INTRAMUSCULAR | Status: AC
  Filled 2016-06-14: qty 1000

## 2016-06-14 MED ORDER — SODIUM CHLORIDE 0.9 % WEIGHT BASED INFUSION
3.0000 mL/kg/h | INTRAVENOUS | Status: DC
  Administered 2016-06-14: 3 mL/kg/h via INTRAVENOUS

## 2016-06-14 MED ORDER — LIDOCAINE HCL (PF) 1 % IJ SOLN
INTRAMUSCULAR | Status: DC | PRN
  Administered 2016-06-14: 2 mL
  Administered 2016-06-14: 15 mL

## 2016-06-14 MED ORDER — FENTANYL CITRATE (PF) 100 MCG/2ML IJ SOLN
INTRAMUSCULAR | Status: AC
  Filled 2016-06-14: qty 2

## 2016-06-14 MED ORDER — SODIUM CHLORIDE 0.9% FLUSH: 3.0000 mL | INTRAVENOUS | Status: DC | PRN

## 2016-06-14 MED ORDER — LIDOCAINE HCL (PF) 1 % IJ SOLN
INTRAMUSCULAR | Status: AC
  Filled 2016-06-14: qty 30

## 2016-06-14 MED ORDER — VERAPAMIL HCL 2.5 MG/ML IV SOLN
INTRAVENOUS | Status: AC
  Filled 2016-06-14: qty 2

## 2016-06-14 SURGICAL SUPPLY — 13 items
CATH 5FR JL3.5 JR4 ANG PIG MP (CATHETERS) ×2 IMPLANT
CATH INFINITI 5FR JL4 (CATHETERS) ×2 IMPLANT
DEVICE RAD COMP TR BAND LRG (VASCULAR PRODUCTS) ×1 IMPLANT
GLIDESHEATH SLEND SS 6F .021 (SHEATH) ×1 IMPLANT
GUIDEWIRE INQWIRE 1.5J.035X260 (WIRE) IMPLANT
INQWIRE 1.5J .035X260CM (WIRE) ×2
KIT HEART LEFT (KITS) ×2 IMPLANT
PACK CARDIAC CATHETERIZATION (CUSTOM PROCEDURE TRAY) ×2 IMPLANT
SHEATH PINNACLE 5F 10CM (SHEATH) ×1 IMPLANT
SYR MEDRAD MARK V 150ML (SYRINGE) ×2 IMPLANT
TRANSDUCER W/STOPCOCK (MISCELLANEOUS) ×2 IMPLANT
TUBING CIL FLEX 10 FLL-RA (TUBING) ×2 IMPLANT
WIRE HI TORQ VERSACORE-J 145CM (WIRE) ×1 IMPLANT

## 2016-06-14 NOTE — Discharge Instructions (Addendum)
May resume Glucophage on Saturday.    Radial Site Care Introduction Refer to this sheet in the next few weeks. These instructions provide you with information about caring for yourself after your procedure. Your health care provider may also give you more specific instructions. Your treatment has been planned according to current medical practices, but problems sometimes occur. Call your health care provider if you have any problems or questions after your procedure. What can I expect after the procedure? After your procedure, it is typical to have the following:  Bruising at the radial site that usually fades within 1-2 weeks.  Blood collecting in the tissue (hematoma) that may be painful to the touch. It should usually decrease in size and tenderness within 1-2 weeks. Follow these instructions at home:  Take medicines only as directed by your health care provider.  You may shower 24-48 hours after the procedure or as directed by your health care provider. Remove the bandage (dressing) and gently wash the site with plain soap and water. Pat the area dry with a clean towel. Do not rub the site, because this may cause bleeding.  Do not take baths, swim, or use a hot tub until your health care provider approves.  Check your insertion site every day for redness, swelling, or drainage.  Do not apply powder or lotion to the site.  Do not flex or bend the affected arm for 24 hours or as directed by your health care provider.  Do not push or pull heavy objects with the affected arm for 24 hours or as directed by your health care provider.  Do not lift over 10 lb (4.5 kg) for 5 days after your procedure or as directed by your health care provider.  Ask your health care provider when it is okay to:  Return to work or school.  Resume usual physical activities or sports.  Resume sexual activity.  Do not drive home if you are discharged the same day as the procedure. Have someone else  drive you.  You may drive 24 hours after the procedure unless otherwise instructed by your health care provider.  Do not operate machinery or power tools for 24 hours after the procedure.  If your procedure was done as an outpatient procedure, which means that you went home the same day as your procedure, a responsible adult should be with you for the first 24 hours after you arrive home.  Keep all follow-up visits as directed by your health care provider. This is important. Contact a health care provider if:  You have a fever.  You have chills.  You have increased bleeding from the radial site. Hold pressure on the site. Get help right away if:  You have unusual pain at the radial site.  You have redness, warmth, or swelling at the radial site.  You have drainage (other than a small amount of blood on the dressing) from the radial site.  The radial site is bleeding, and the bleeding does not stop after 30 minutes of holding steady pressure on the site.  Your arm or hand becomes pale, cool, tingly, or numb. This information is not intended to replace advice given to you by your health care provider. Make sure you discuss any questions you have with your health care provider. Document Released: 08/12/2010 Document Revised: 12/16/2015 Document Reviewed: 01/26/2014  2017 Elsevier  Angiogram, Care After These instructions give you information about caring for yourself after your procedure. Your doctor may also give you more  specific instructions. Call your doctor if you have any problems or questions after your procedure. Follow these instructions at home:  Take medicines only as told by your doctor.  Follow your doctor's instructions about:  Care of the area where the tube was inserted.  Bandage (dressing) changes and removal.  You may shower 24-48 hours after the procedure or as told by your doctor.  Do not take baths, swim, or use a hot tub until your doctor  approves.  Every day, check the area where the tube was inserted. Watch for:  Redness, swelling, or pain.  Fluid, blood, or pus.  Do not apply powder or lotion to the site.  Do not lift anything that is heavier than 10 lb (4.5 kg) for 5 days or as told by your doctor.  Ask your doctor when you can:  Return to work or school.  Do physical activities or play sports.  Have sex.  Do not drive or operate heavy machinery for 24 hours or as told by your doctor.  Have someone with you for the first 24 hours after the procedure.  Keep all follow-up visits as told by your doctor. This is important. Contact a health care provider if:  You have a fever.  You have chills.  You have more bleeding from the area where the tube was inserted. Hold pressure on the area.  You have redness, swelling, or pain in the area where the tube was inserted.  You have fluid or pus coming from the area. Get help right away if:  You have a lot of pain in the area where the tube was inserted.  The area where the tube was inserted is bleeding, and the bleeding does not stop after 30 minutes of holding steady pressure on the area.  The area near or just beyond the insertion site becomes pale, cool, tingly, or numb. This information is not intended to replace advice given to you by your health care provider. Make sure you discuss any questions you have with your health care provider. Document Released: 10/06/2008 Document Revised: 12/16/2015 Document Reviewed: 12/11/2012 Elsevier Interactive Patient Education  2017 [employer].

## 2016-06-14 NOTE — H&P (View-Only) (Signed)
Cardiology Office Note    Date:  05/30/2016   ID:  Mason [name], DOB [birth year], MRN [id number]  PCP:  Tammi Sou, MD  Cardiologist:   [name], MD   Chief Complaint  Patient presents with  . Shortness of Breath    pt c/o SOB, for the past couple of months    History of Present Illness:  Mason [name] is a 75 y.o. 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 He is now taking a low dose of loop diuretic for the last month or so and this has led to improved exertional dyspnea. In August his echocardiogram showed left ventricular ejection fraction that had dropped to 40%, with diffuse hypokinesis. In October his nuclear stress test showed a normal pattern of myocardial perfusion, but the EF was down to 32%.  His dual-chamber Medtronic Revo pacemaker was implanted in July 2013 but is now programmed VVIR for permanent atrial fibrillation. He has 100% atrial fibrillation and 100% ventricular pacing. Despite the abnormalities described above he is extremely active (6.4 hours/day as measured by his pacemaker). Battery voltage is 2.93 V/RRT 2.81 V) He is on chronic anticoagulation with Xarelto and denies either embolic or bleeding problems.  He does not have known coronary or other vascular disorders, but has numerous risk factors including type 2 diabetes, hyperlipidemia, mild obesity. He has a history of gout.  He is planning left shoulder replacement surgery in December 2017 (Dr. [name]). His pacemaker is located in the left subclavian region and will require VOO reprogramming before he has shoulder operation.    Past Medical History:  Diagnosis Date  . Arthritis    left shoulder (Dr. [name]): steroid injection 04/03/16.  Plan for TSA surgery 06/2016.  [name] BPH (benign prostatic hypertrophy) 06/2011   Irritative sx's; pt declined trial of anticholinergic per Urology records  . Chronic combined systolic and diastolic heart failure ([place]) 05/31/2012   EF 40-45%, LA mod-severe dilated, AFIB.   02/2016 EF 40%, diffuse hypokinesis, grade 2 DD.  [name] Chronic low back pain   . Chronic renal insufficiency, stage III (moderate) 2015   CrCl about 60 ml/min  . Depression   . Diabetes mellitus   . Episodic low back pain 01/22/2013   w/intermittent radiculitis (12/2014 his neurologist referred him to pain mgmt for epidural steroid injection)  . GERD (gastroesophageal reflux disease)   . H/O tilt table evaluation 11/02/05   negative  . Helicobacter pylori gastritis 01/2016  . History of adenomatous polyp of colon 10/12/11   Dr. [name] (3 right side of colon- tubular adenomas removed)  . History of cardiovascular stress test 05/28/12   no ischemia, EF 37%, imaging results are unchanged and within normal variance  . History of chronic prostatitis   . History of retinal detachment    OS  . History of vertigo    + Hx of posterior HA's.  Neuro (Dr. Erling Cruz) eval 2011.  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  . Hypertension   . Lumbar spondylosis    lumbosacral radiculopathy at L4 by EMG testing, right foot drop (neurologist is Dr. Linus Salmons with [facility] in [place])--neurologist referred him to neurosurgery  . Nephrolithiasis 07/2012   Left UVJ 2 mm stone with dilation of renal collecting system and slight hydroureter on right  . 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  [name] Permanent atrial fibrillation ([place])    DCCV 07/09/13-converted, lasted two days, then back into afib--needs lifetime anticoagulation (Xarelto as of 09/2014)  . Shortness of breath     Past Surgical History:  Procedure Laterality Date  . CARDIOVASCULAR STRESS TEST  2012   nuclear perfusion study:  low risk scan  . CARDIOVERSION  07/09/2012   Procedure: CARDIOVERSION;  Surgeon: [name], MD;  Location: [facility] ENDOSCOPY;  Service: Cardiovascular;  Laterality: N/A;  . COLONOSCOPY W/ POLYPECTOMY  approx 2006; repeated 09/2011   Polyps on 2013 EGD as well, repeat 12/2014  . ESOPHAGOGASTRODUODENOSCOPY  10/18/06   Done due to chronic GERD: Normal, bx showed no barrett's esophagus (Dr. [name])  . EYE SURGERY     retinal detachment OS+ cataract surgery  . INSERT / REPLACE / REMOVE PACEMAKER  02/05/2012   dual chamber, sinus node dysfunction, sinus arrest, PAF, Medtronic Revo serial#[id number] H: last checked 05/2015  . LUMBAR LAMINECTOMY  1976   left L4-5  . PERMANENT PACEMAKER INSERTION N/A 02/05/2012   Procedure: PERMANENT PACEMAKER INSERTION;  Surgeon: [name], MD; Generator Medtronic [place] model [place] serial number [id number] H Laterality: N/A;  . TRANSTHORACIC ECHOCARDIOGRAM  08/25/10; 05/2012; 03/23/16   mild asymmetric LVH, normal systolic function, normal diastolic fxn, mild-to-mod mitral regurg, mild aortic valve sclerosis and trace AI, mild aortic root dilatation. 2014 f/u showed EF 40-45%, mod LAE, A FIB.  02/2016 EF 40%, diffuse hypokinesis, grade 2 DD.    Current Medications: Outpatient Medications Prior to Visit  Medication Sig Dispense Refill  . colchicine 0.6 MG tablet Take 1 tablet (0.6 mg total) by mouth daily. 15 tablet 0  . COMBIGAN 0.2-0.5 % ophthalmic solution     . furosemide (LASIX) 20 MG tablet Take 1 tablet (20 mg total) by mouth daily. FOR 5 DAYS ONLY. THEN 1 DAILY AS NEED FOR WEIGHT GAIN/SWELLING 30 tablet 2  . gabapentin (NEURONTIN) 300 MG capsule TAKE 1 CAPSULE THREE TIMES A DAY 270 capsule 3  . GLIPIZIDE XL 5 MG 24 hr tablet TAKE 1 TABLET EVERY MORNING 90 tablet 1  . glucose blood test strip Use as instructed 100 each 3  . JANUVIA 100 MG tablet TAKE 1 TABLET DAILY 90 tablet 1  . Lancets (PRECISION ULTRA LANCET) MISC Use as instructed 100 each 3  . latanoprost  (XALATAN) 0.005 % ophthalmic solution Place 1 drop into both eyes at bedtime. Instill 1 drop in each eye at bedtime  12  . losartan (COZAAR) 100 MG tablet TAKE 1 TABLET DAILY 90 tablet 1  . metFORMIN (GLUCOPHAGE) 1000 MG tablet TAKE 1 TABLET TWICE A DAY 180 tablet 1  . metoprolol succinate (TOPROL-XL) 100 MG 24 hr tablet 1 and 1/2 tabs po qd 135 tablet 3  . omeprazole (PRILOSEC) 40 MG capsule Take 1 capsule (40 mg total) by mouth daily. 90 capsule 3  . potassium chloride (K-DUR) 10 MEQ tablet Take 1 tablet (10 mEq total) by mouth daily. FOR 5 DAYS ONLY. THEN 1 DAILY AS NEED FOR WEIGHT GAIN/SWELLING 30 tablet 2  . predniSONE (DELTASONE) 20 MG tablet 2 tabs po qd x 5d, then 1 tab po qd x 5d, then 1/2 tab po qd x 6d 18 tablet 0  . PYLERA 140-125-125 MG capsule     . rOPINIRole (REQUIP) 1 MG tablet TAKE 1 TABLET DAILY (NEED OFFICE VISIT FOR MORE REFILLS) 90 tablet 1  . sertraline (ZOLOFT) 100 MG tablet TAKE 1 TABLET  DAILY 90 tablet 1  . simvastatin (ZOCOR) 20 MG tablet TAKE 1 TABLET EVERY EVENING 90 tablet 1  . XARELTO 20 MG TABS tablet TAKE 1 TABLET DAILY WITH SUPPER 90 tablet 1   No facility-administered medications prior to visit.      Allergies:   Patient has no known allergies.   Social History   Social History  . Marital status: Married    Spouse name: N/A  . Number of children: N/A  . Years of education: N/A   Social History Main Topics  . Smoking status: Never Smoker  . Smokeless tobacco: Never Used  . Alcohol use 7.2 oz/week    12 Cans of beer per week     Comment: 12 beers per week  . Drug use: No  . Sexual activity: Not on file   Other Topics Concern  . Not on file   Social History Narrative  . No narrative on file     Family History:  The patient's family history includes Heart failure in his mother; Stroke in his father and mother.   ROS:   Please see the history of present illness.    ROS All other systems reviewed and are negative.   PHYSICAL EXAM:   VS:   BP (!) 147/98   Pulse 75   Ht 5\' 11"  (1.803 m)   Wt 231 lb 9.6 oz (105.1 kg)   BMI 32.30 kg/m    GEN: Well nourished, well developed, in no acute distress  HEENT: normal  Neck: no JVD, carotid bruits, or masses Cardiac: RRR, paradoxically split S2; no murmurs, rubs, or gallops,no edema  Respiratory:  clear to auscultation bilaterally, normal work of breathing GI: soft, nontender, nondistended, + BS MS: no deformity or atrophy  Skin: warm and dry, no rash Neuro:  Alert and Oriented x 3, Strength and sensation are intact Psych: euthymic mood, full affect  Wt Readings from Last 3 Encounters:  05/30/16 231 lb 9.6 oz (105.1 kg)  05/10/16 222 lb (100.7 kg)  05/01/16 222 lb 12.8 oz (101.1 kg)      Studies/Labs Reviewed:   EKG:  EKG is ordered today.  The ekg ordered today demonstrates 100% ventricular pacing on a back on of atrial fibrillation  Recent Labs: 02/21/2016: ALT 17; Hemoglobin 14.1; Platelets 159 04/07/2016: BUN 25; Creat 1.42; Potassium 4.4; Sodium 139   Lipid Panel    Component Value Date/Time   CHOL 128 09/20/2015 0946   TRIG 109.0 09/20/2015 0946   HDL 43.80 09/20/2015 0946   CHOLHDL 3 09/20/2015 0946   VLDL 21.8 09/20/2015 0946   LDLCALC 63 09/20/2015 0946    Additional studies/ records that were reviewed today include:  Echo and nuclear images    ASSESSMENT:    1. Chronic combined systolic and diastolic heart failure (HCC)   2. Cardiac pacemaker   3. AVB (atrioventricular block)   4. Permanent atrial fibrillation ([place])   5. Diabetes mellitus with complication ([place])   6. HTN (hypertension), benign   7. Pre-op examination   8. Coagulation disorder ([place])   9. Fatigue, unspecified type      PLAN:  In order of problems listed above:  1. CHF: I'm quite concerned about the steady decline in left ventricular systolic function, especially that now he has symptoms of heart failure. The drop in LVEF is more than one would expect from RV pacing related  dyssynchrony. He has numerous risk factors for coronary artery disease and I recommend that he have coronary  angiography. Even in the absence of angina pectoris, I think we need to exclude "balanced ischemia" such as from a stenotic dominant left coronary artery. 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 Discussed left heart catheterization. Will be scheduled with Dr. [place] on November 22. This procedure has been fully reviewed with the patient and written informed consent has been obtained. Hold metformin, furosemide, losartan on the morning of the catheterization. Hold Xarelto 48 hours before catheterization. 2. 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 I think it is safest if we consider him to be pacemaker dependent. If CAD is not identified, I would refer him for upgrade to CRT-P. he is already on relatively high doses of beta blocker and angiotensin receptor blocker. 3. AFib: Permanent. Should not interfere with cardiac resynchronization since he has progressed to complete heart block. Is compliant with anticoagulation. Hold Xarelto for heart cath. 4. PPM: Normal device function. Option for upgrade to BiV device if he does not have need for coronary revascularization. 5. Obesity/metabolic syndrome/diabetes mellitus/low HDL: LDL is in desirable range and he has good glycemic control. He is very physically active. Kidneys to focus on losing weight by mouth caloric/carbohydrate restriction. 6. HTN: consider Entresto instead of losartan. Change after heart catheterization. 7. Preop risk eval: Recommend that he have cardiac catheterization first, but unless he needs percutaneous revascularization, this should not lead to any significant delays in his plan shoulder procedure. Even if he needs bypass surgery, it is conceivable that he could still have his shoulder procedure in late December with Dr. [name]  as scheduled. I would recommend that shoulder replacement and the necessary orthopedic rehabilitation be performed before upgrade him to a biventricular device, to avoid the risk of left ventricular lead dislodgment with shoulder manipulation. Since he is virtually pacemaker dependent and the pacemaker is located in the left subclavian area, will have to reprogram his pacemaker to VOO setting before shoulder surgery.    Medication Adjustments/Labs and Tests Ordered: Current medicines are reviewed at length with the patient today.  Concerns regarding medicines are outlined above.  Medication changes, Labs and Tests ordered today are listed in the Patient Instructions below. Patient Instructions  Dr Sallyanne Kuster has requested that you have a left cardiac catheterization with an interventionalist. Cardiac catheterization is used to diagnose and/or treat various heart conditions. Doctors may recommend this procedure for a number of different reasons. The most common reason is to evaluate chest pain. Chest pain can be a symptom of coronary artery disease (CAD), and cardiac catheterization can show whether plaque is narrowing or blocking your heart's arteries. This procedure is also used to evaluate the valves, as well as measure the blood flow and oxygen levels in different parts of your heart. For further information please visit [email or web address].   Following your catheterization, you will not be allowed to drive for 3 days.  No lifting, pushing, or pulling greater that 10 pounds is allowed for 1 week.  You will be required to have the following tests prior to the procedure:  1. Blood work - the blood work can be done no more than 7 days prior to the procedure. It can be done at any [facility] lab. There is one downstairs on the first floor of this building and one in the [facility] building [phone]).   Please HOLD the following  medications: >>Losartan >>Metformin >>Furosemide  Signed, [name], MD  05/30/2016 4:17 PM    [facility] [address], [place], [place]  [address] Phone: [phone]; Fax: [phone]

## 2016-06-14 NOTE — Progress Notes (Signed)
Thank you, Peter 

## 2016-06-14 NOTE — Interval H&P Note (Signed)
History and Physical Interval Note:  06/14/2016 8:20 AM  Mason [name]  has presented today for surgery, with the diagnosis of chf, preop clearance  The various methods of treatment have been discussed with the patient and family. After consideration of risks, benefits and other options for treatment, the patient has consented to  Procedure(s): Left Heart Cath and Coronary Angiography (N/A) as a surgical intervention .  The patient's history has been reviewed, patient examined, no change in status, stable for surgery.  I have reviewed the patient's chart and labs.  Questions were answered to the patient's satisfaction.   Cath Lab Visit (complete for each Cath Lab visit)  Clinical Evaluation Leading to the Procedure:   ACS: No.  Non-ACS:    Anginal Classification: CCS II  Anti-ischemic medical therapy: Minimal Therapy (1 class of medications)  Non-Invasive Test Results: High-risk stress test findings: cardiac mortality >3%/year  Prior CABG: No previous CABG        Mason [name] [facility] 06/14/2016 8:20 AM

## 2016-06-14 NOTE — Progress Notes (Signed)
Site area: rt groin Site Prior to Removal:  Level 0 Pressure Applied For:  20 minutes Manual:   yes Patient Status During Pull:  stable Post Pull Site:  Level  0 Post Pull Instructions Given:  yes Post Pull Pulses Present: yes Dressing Applied:  Gauze/tegaderm Bedrest begins @ 0920 Comments:

## 2016-06-15 NOTE — Progress Notes (Signed)
Taken care of, Mason [name]

## 2016-06-21 ENCOUNTER — Telehealth: Payer: Self-pay | Admitting: Pharmacist Clinician (PhC)/ Clinical Pharmacy Specialist

## 2016-06-21 NOTE — Telephone Encounter (Signed)
Spoke with patient.  Explained Dr. Loletha Grayer wanting to switch from losartan to [facility] because of heart failure issues.  Explained that this medication can decrease risk of re-hospitalization.     Patient agreeable to switch off losartan and onto Entresto.  Will start him at the 49/51 mg dose

## 2016-06-21 NOTE — Telephone Encounter (Signed)
-----   Message from [name], MD sent at 06/14/2016 10:26 AM EST ----- Will, This gentleman just had cath today: normal coronaries. He has high grade AV block and his EF has dropped substantially, now around 30%. Please take a look at him for CRT-D. He needs shoulder surgery and rehab, so I think that should happen first to avoid lead dislodgement issues.  Erasmo Downer or [employer], Can we please see if we can switch from [facility] to [facility]?  Thank you,  Dani Gobble

## 2016-06-22 MED ORDER — SACUBITRIL-VALSARTAN 49-51 MG PO TABS: 1.0000 | ORAL_TABLET | Freq: Two times a day (BID) | ORAL | 0 refills | Status: DC

## 2016-06-22 NOTE — Telephone Encounter (Signed)
Samples left at front desk, will schedule appt for 2 weeks to up-titrate dose.

## 2016-06-23 NOTE — Progress Notes (Signed)
Subjective:   Mason [name] is a 75 y.o. male who presents for Medicare Annual/Subsequent preventive examination.  The Patient was informed that the wellness visit is to identify future health risk and educate and initiate measures that can reduce risk for increased disease through the lifespan.   Describes health as fair, good or great? "good, besides short winded easily" Shoulder surgery scheduled for 07/21/2016.  Operates part Transport planner care service.   Review of Systems:  No ROS.  Medicare Wellness Visit.  Cardiac Risk Factors include: advanced age (>5men, >63 women);diabetes mellitus;dyslipidemia;hypertension;family history of premature cardiovascular disease;obesity (BMI >30kg/m2);sedentary lifestyle;male gender   Patient rarely monitors home glucose, states he can feel when it is too low.  Discussed monitoring glucoses, specifically when he changes his diet and exercise routine.   Sleep patterns: Sleeps 6-8 hours Home Safety/Smoke Alarms:  Smoke detectors in place.  Living environment; residence and Firearm Safety: Lives with wife in single story home. Several firearms not locked away, firearm safety discussed.   Seat Belt Safety/Bike Helmet: Wears seat belt.    Counseling:   Eye Exam-Last DM eye exam, 04/18/2016. Negative for retinopathy. [facility].  Dental-Last exam 06/01/2016, yearly by Dr. Jetty Duhamel.  Male:   CCS-Colonoscopy 2013, polyps. Recall 5 years?. Referral placed.    PSA-0.360, 04/19/2006. Followed by urology.      Objective:    Vitals: BP 114/78 (BP Location: Left Arm, Patient Position: Sitting, Cuff Size: Normal)   Pulse 77   Ht 5\' 11"  (1.803 m)   Wt 225 lb (102.1 kg)   SpO2 97%   BMI 31.38 kg/m   Body mass index is 31.38 kg/m.  Tobacco History  Smoking Status  . Never Smoker  Smokeless Tobacco  . Never Used     Counseling given: Not Answered   Past Medical History:  Diagnosis Date  . Arthritis    left shoulder (Dr.  [name]): steroid injection 04/03/16.  Plan for TSA surgery 06/2016.  [name] BPH (benign prostatic hypertrophy) 06/2011   Irritative sx's; pt declined trial of anticholinergic per Urology records  . Chronic combined systolic and diastolic heart failure ([place]) 05/31/2012   EF 40-45%, LA mod-severe dilated, AFIB.   02/2016 EF 40%, diffuse hypokinesis, grade 2 DD.  Myoc perf imaging showed EF 32% 04/2016.  Pt being considered for CRT-D as of 05/2016.  [name] Chronic low back pain   . Chronic renal insufficiency, stage III (moderate) 2015   CrCl about 60 ml/min  . Depression   . Diabetes mellitus   . Episodic low back pain 01/22/2013   w/intermittent radiculitis (12/2014 his neurologist referred him to pain mgmt for epidural steroid injection)  . GERD (gastroesophageal reflux disease)   . H/O tilt table evaluation 11/02/05   negative  . Helicobacter pylori gastritis 01/2016  . History of adenomatous polyp of colon 10/12/11   Dr. [name] (3 right side of colon- tubular adenomas removed)  . History of cardiovascular stress test 05/28/12   no ischemia, EF 37%, imaging results are unchanged and within normal variance  . History of chronic prostatitis   . History of retinal detachment    OS  . History of vertigo    + Hx of posterior HA's.  Neuro (Dr. Erling Cruz) eval 2011.  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  . Hypertension   . Lumbar spondylosis    lumbosacral radiculopathy at L4 by EMG testing, right foot drop (neurologist is Dr. Linus Salmons with  [facility] in [place])--neurologist referred him to neurosurgery  . Nephrolithiasis 07/2012   Left UVJ 2 mm stone with dilation of renal collecting system and slight hydroureter on right  . 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  Since no CAD on cath 05/2016, cards recommends upgrade to CRT-D.  [name] Permanent atrial  fibrillation ([place])    DCCV 07/09/13-converted, lasted two days, then back into afib--needs lifetime anticoagulation (Xarelto as of 09/2014)  . Shortness of breath    Past Surgical History:  Procedure Laterality Date  . CARDIAC CATHETERIZATION N/A 06/14/2016   Minimal nonobstructive dz, EF 25-35%.  Procedure: Left Heart Cath and Coronary Angiography;  Surgeon: Peter M Martinique, MD;  Location: [place] [facility];  Service: Cardiovascular;  Laterality: N/A;  . CARDIOVASCULAR STRESS TEST  2012   2012 nuclear perfusion study: low risk scan; 04/2016 normal myocardial perfusion imaging, EF 32%.  [name] CARDIOVERSION  07/09/2012   Procedure: CARDIOVERSION;  Surgeon: [name], MD;  Location: [facility] ENDOSCOPY;  Service: Cardiovascular;  Laterality: N/A;  . COLONOSCOPY W/ POLYPECTOMY  approx 2006; repeated 09/2011   Polyps on 2013 EGD as well, repeat 12/2014  . ESOPHAGOGASTRODUODENOSCOPY  10/18/06   Done due to chronic GERD: Normal, bx showed no barrett's esophagus (Dr. [name])  . EYE SURGERY     retinal detachment OS+ cataract surgery  . INSERT / REPLACE / REMOVE PACEMAKER  02/05/2012   dual chamber, sinus node dysfunction, sinus arrest, PAF, Medtronic Revo serial#[id number]: last checked 05/2015  . LUMBAR LAMINECTOMY  1976   left L4-5  . PERMANENT PACEMAKER INSERTION N/A 02/05/2012   Procedure: PERMANENT PACEMAKER INSERTION;  Surgeon: [name], MD; Generator Medtronic Butler model [place] serial number [id number] H Laterality: N/A;  . TRANSTHORACIC ECHOCARDIOGRAM  08/25/10; 05/2012; 03/23/16   mild asymmetric LVH, normal systolic function, normal diastolic fxn, mild-to-mod mitral regurg, mild aortic valve sclerosis and trace AI, mild aortic root dilatation. 2014 f/u showed EF 40-45%, mod LAE, A FIB.  02/2016 EF 40%, diffuse hypokinesis, grade 2 DD.   Family History  Problem Relation Age of Onset  . Heart failure Mother   . Stroke Mother   . Stroke Father    History  Sexual Activity  . Sexual activity:  Not on file    Outpatient Encounter Prescriptions as of 06/26/2016  Medication Sig  . colchicine 0.6 MG tablet Take 1 tablet (0.6 mg total) by mouth daily. (Patient taking differently: Take 0.6 mg by mouth daily as needed (gout flare). )  . COMBIGAN 0.2-0.5 % ophthalmic solution Place 1 drop into both eyes 2 (two) times daily.   [name] gabapentin (NEURONTIN) 300 MG capsule TAKE 1 CAPSULE THREE TIMES A DAY  . GLIPIZIDE XL 5 MG 24 hr tablet TAKE 1 TABLET EVERY MORNING  . glucose blood test strip Use as instructed  . JANUVIA 100 MG tablet TAKE 1 TABLET DAILY  . Lancets (PRECISION ULTRA LANCET) MISC Use as instructed  . latanoprost (XALATAN) 0.005 % ophthalmic solution Place 1 drop into both eyes at bedtime.   [name] losartan (COZAAR) 100 MG tablet TAKE 1 TABLET DAILY  . metoprolol succinate (TOPROL-XL) 100 MG 24 hr tablet 1 and 1/2 tabs po qd  . omeprazole (PRILOSEC) 40 MG capsule Take 1 capsule (40 mg total) by mouth daily. (Patient taking differently: Take 40 mg by mouth daily as needed (indigestion). )  . rOPINIRole (REQUIP) 1 MG tablet TAKE 1 TABLET DAILY (NEED OFFICE VISIT FOR MORE REFILLS) (Patient  taking differently: Take 1mg  at bedtime)  . sacubitril-valsartan (ENTRESTO) 49-51 MG Take 1 tablet by mouth 2 (two) times daily.  . sertraline (ZOLOFT) 100 MG tablet TAKE 1 TABLET DAILY  . simvastatin (ZOCOR) 20 MG tablet TAKE 1 TABLET EVERY EVENING  . XARELTO 20 MG TABS tablet TAKE 1 TABLET DAILY WITH SUPPER  . metFORMIN (GLUCOPHAGE) 1000 MG tablet   . [DISCONTINUED] sertraline (ZOLOFT) 100 MG tablet TAKE 1 TABLET DAILY   No facility-administered encounter medications on file as of 06/26/2016.     Activities of Daily Living In your present state of health, do you have any difficulty performing the following activities: 06/26/2016 06/14/2016  Hearing? N N  Vision? N N  Difficulty concentrating or making decisions? N N  Walking or climbing stairs? N Y  Dressing or bathing? N N  Doing errands,  shopping? N -  Preparing Food and eating ? N -  Using the Toilet? N -  In the past six months, have you accidently leaked urine? N -  Do you have problems with loss of bowel control? N -  Managing your Medications? N -  Managing your Finances? N -  Housekeeping or managing your Housekeeping? N -  Some recent data might be hidden    Patient Care Team: Tammi Sou, MD as PCP - General (Family Medicine) Carol Ada, MD as Consulting Physician (Gastroenterology) Kathie Rhodes, MD as Consulting Physician (Urology) Troy Sine, MD as Consulting Physician (Cardiology) Audery Amel, MD as Referring Physician (Neurology) Clent Jacks, MD as Consulting Physician (Ophthalmology) [name], MD as Consulting Physician (Cardiology) Audery Amel, MD as Consulting Physician (Neurology) Mickeal Skinner, DO as Consulting Physician (Orthopedic Surgery) Aleda Grana (Dentistry)   Assessment:    Physical assessment deferred to PCP.  Exercise Activities and Dietary recommendations Current Exercise Habits: The patient does not participate in regular exercise at present [facility] care business. ), Exercise limited by: orthopedic condition(s);respiratory conditions(s);cardiac condition(s)   Diet (meal preparation, eat out, water intake, caffeinated beverages, dairy products, fruits and vegetables): Eats out half of meals. States he eats fruits and vegetables. Drinks water (< 8 glasses/day) and beer.  Breakfast: Eggs, bacon, sausage, cereal, oatmeal. Coffee (2 cups) Lunch: Skips  Dinner: cornbread, potatoes, beans, fried and grilled meat.      Discussed the importance of eating at least 3 meals/day. Educated on heart healthy food options and lifestyle.  Encouraged to increase activity as tolerated.   Goals      Patient Stated   . patient states (pt-stated)          "get my heart back". Plans to increase water intake and exercise.       Fall Risk Fall Risk  06/26/2016  04/26/2016 04/22/2015  Falls in the past year? Yes No No  Number falls in past yr: 2 or more - -  Injury with Fall? No - -  Follow up Falls prevention discussed - -   Patient reports tripping over things by not paying attention. Discussed fall prevention.   Depression Screen PHQ 2/9 Scores 06/26/2016 04/26/2016 04/22/2015  PHQ - 2 Score 0 1 0    Cognitive Function       Ad8 score reviewed for issues:  Issues making decisions:no  Less interest in hobbies / activities:no  Repeats questions, stories (family complaining):no  Trouble using ordinary gadgets (microwave, computer, phone):no  Forgets the month or year: no  Mismanaging finances: no  Remembering appts:no  Daily problems with thinking and/or memory:no  Ad8 score is=0     Immunization History  Administered Date(s) Administered  . Influenza, High Dose Seasonal PF 05/26/2013, 05/20/2015  . Influenza,inj,Quad PF,36+ Mos 06/15/2014  . Pneumococcal Conjugate-13 10/15/2014  . Pneumococcal Polysaccharide-23 07/24/2010  . Tdap 08/30/2011   Screening Tests Health Maintenance  Topic Date Due  . ZOSTAVAX  07/24/2002  . INFLUENZA VACCINE  02/22/2016  . HEMOGLOBIN A1C  09/14/2016  . FOOT EXAM  09/19/2016  . OPHTHALMOLOGY EXAM  04/18/2017  . TETANUS/TDAP  08/29/2021  . COLONOSCOPY  10/11/2021  . PNA vac Low Risk Adult  Completed   High Dose Flu Vaccine administered today. Declines Zostavax.     Plan:     Eat heart healthy diet (full of fruits, vegetables, whole grains, lean protein, water--limit salt, fat, and sugar intake) and increase physical activity as tolerated.  Continue doing brain stimulating activities (puzzles, reading, adult coloring books, staying active) to keep memory sharp.   Complete advanced directive, bring copy to office.   Make Colonoscopy appointment.   Good luck with shoulder surgery!   During the course of the visit the patient was educated and counseled about the following  appropriate screening and preventive services:   Vaccines to include Pneumoccal, Influenza, Hepatitis B, Td, Zostavax, HCV  Cardiovascular Disease  Colorectal cancer screening  Diabetes screening  Prostate Cancer Screening  Glaucoma screening  Nutrition counseling    Patient Instructions (the written plan) was given to the patient.    Gerilyn Nestle, RN  06/26/2016

## 2016-06-23 NOTE — Progress Notes (Signed)
Pre visit review using our clinic review tool, if applicable. No additional management support is needed unless otherwise documented below in the visit note. 

## 2016-06-24 ENCOUNTER — Other Ambulatory Visit: Payer: Self-pay | Admitting: Family Medicine

## 2016-06-26 ENCOUNTER — Ambulatory Visit (INDEPENDENT_AMBULATORY_CARE_PROVIDER_SITE_OTHER): Payer: Medicare Other

## 2016-06-26 VITALS — BP 114/78 | HR 77 | Ht 71.0 in | Wt 225.0 lb

## 2016-06-26 DIAGNOSIS — Z0000 Encounter for general adult medical examination without abnormal findings: Secondary | ICD-10-CM

## 2016-06-26 DIAGNOSIS — Z1211 Encounter for screening for malignant neoplasm of colon: Secondary | ICD-10-CM

## 2016-06-26 DIAGNOSIS — Z23 Encounter for immunization: Secondary | ICD-10-CM | POA: Diagnosis not present

## 2016-06-26 NOTE — Patient Instructions (Addendum)
Eat heart healthy diet (full of fruits, vegetables, whole grains, lean protein, water--limit salt, fat, and sugar intake) and increase physical activity as tolerated.  Continue doing brain stimulating activities (puzzles, reading, adult coloring books, staying active) to keep memory sharp.   Complete advanced directive, bring copy to office.   Make Colonoscopy appointment.   Good luck with shoulder surgery!  Fat and Cholesterol Restricted Diet High levels of fat and cholesterol in your blood may lead to various health problems, such as diseases of the heart, blood vessels, gallbladder, liver, and pancreas. Fats are concentrated sources of energy that come in various forms. Certain types of fat, including saturated fat, may be harmful in excess. Cholesterol is a substance needed by your body in small amounts. Your body makes all the cholesterol it needs. Excess cholesterol comes from the food you eat. When you have high levels of cholesterol and saturated fat in your blood, health problems can develop because the excess fat and cholesterol will gather along the walls of your blood vessels, causing them to narrow. Choosing the right foods will help you control your intake of fat and cholesterol. This will help keep the levels of these substances in your blood within normal limits and reduce your risk of disease. What is my plan? Your health care provider recommends that you:  Limit your fat intake to ______% or less of your total calories per day.  Limit the amount of cholesterol in your diet to less than _________mg per day.  Eat 20-30 grams of fiber each day. What types of fat should I choose?  Choose healthy fats more often. Choose monounsaturated and polyunsaturated fats, such as olive and canola oil, flaxseeds, walnuts, almonds, and seeds.  Eat more omega-3 fats. Good choices include salmon, mackerel, sardines, tuna, flaxseed oil, and ground flaxseeds. Aim to eat fish at least two times a  week.  Limit saturated fats. Saturated fats are primarily found in animal products, such as meats, butter, and cream. Plant sources of saturated fats include palm oil, palm kernel oil, and coconut oil.  Avoid foods with partially hydrogenated oils in them. These contain trans fats. Examples of foods that contain trans fats are stick margarine, some tub margarines, cookies, crackers, and other baked goods. What general guidelines do I need to follow? These guidelines for healthy eating will help you control your intake of fat and cholesterol:  Check food labels carefully to identify foods with trans fats or high amounts of saturated fat.  Fill one half of your plate with vegetables and green salads.  Fill one fourth of your plate with whole grains. Look for the word "whole" as the first word in the ingredient list.  Fill one fourth of your plate with lean protein foods.  Limit fruit to two servings a day. Choose fruit instead of juice.  Eat more foods that contain fiber, such as apples, broccoli, carrots, beans, peas, and barley.  Eat more home-cooked food and less restaurant, buffet, and fast food.  Limit or avoid alcohol.  Limit foods high in starch and sugar.  Limit fried foods.  Cook foods using methods other than frying. Baking, boiling, grilling, and broiling are all great options.  Lose weight if you are overweight. Losing just 5-10% of your initial body weight can help your overall health and prevent diseases such as diabetes and heart disease. What foods can I eat? Grains  Whole grains, such as whole wheat or whole grain breads, crackers, cereals, and pasta. Unsweetened oatmeal, bulgur,  barley, quinoa, or brown rice. Corn or whole wheat flour tortillas. Vegetables  Fresh or frozen vegetables (raw, steamed, roasted, or grilled). Green salads. Fruits  All fresh, canned (in natural juice), or frozen fruits. Meats and other protein foods  Ground beef (85% or leaner),  grass-fed beef, or beef trimmed of fat. Skinless chicken or [place]. Ground chicken or [place]. Pork trimmed of fat. All fish and seafood. Eggs. Dried beans, peas, or lentils. Unsalted nuts or seeds. Unsalted canned or dry beans. Dairy  Low-fat dairy products, such as skim or 1% milk, 2% or reduced-fat cheeses, low-fat ricotta or cottage cheese, or plain low-fat yo Fats and oils  Tub margarines without trans fats. Light or reduced-fat mayonnaise and salad dressings. Avocado. Olive, canola, sesame, or safflower oils. Natural peanut or almond butter (choose ones without added sugar and oil). The items listed above may not be a complete list of recommended foods or beverages. Contact your dietitian for more options.  Foods to avoid Grains  White bread. White pasta. White rice. Cornbread. Bagels, pastries, and croissants. Crackers that contain trans fat. Vegetables  White potatoes. Corn. Creamed or fried vegetables. Vegetables in a cheese sauce. Fruits  Dried fruits. Canned fruit in light or heavy syrup. Fruit juice. Meats and other protein foods  Fatty cuts of meat. Ribs, chicken wings, bacon, sausage, bologna, salami, chitterlings, fatback, hot dogs, bratwurst, and packaged luncheon meats. Liver and organ meats. Dairy  Whole or 2% milk, cream, half-and-half, and cream cheese. Whole milk cheeses. Whole-fat or sweetened yogurt. Full-fat cheeses. Nondairy creamers and whipped toppings. Processed cheese, cheese spreads, or cheese curds. Beverages  Alcohol. Sweetened drinks (such as sodas, lemonade, and fruit drinks or punches). Fats and oils  Butter, stick margarine, lard, shortening, ghee, or bacon fat. Coconut, palm kernel, or palm oils. Sweets and desserts  Corn syrup, sugars, honey, and molasses. Candy. Jam and jelly. Syrup. Sweetened cereals. Cookies, pies, cakes, donuts, muffins, and ice cream. The items listed above may not be a complete list of foods and beverages to avoid. Contact  your dietitian for more information.  This information is not intended to replace advice given to you by your health care provider. Make sure you discuss any questions you have with your health care provider. Document Released: 07/10/2005 Document Revised: 07/31/2014 Document Reviewed: 10/08/2013 Elsevier Interactive Patient Education  2017 [place] Prevention in the Home Introduction Falls can cause injuries. They can happen to people of all ages. There are many things you can do to make your home safe and to help prevent falls. What can I do on the outside of my home?  Regularly fix the edges of walkways and driveways and fix any cracks.  Remove anything that might make you trip as you walk through a door, such as a raised step or threshold.  Trim any bushes or trees on the path to your home.  Use bright outdoor lighting.  Clear any walking paths of anything that might make someone trip, such as rocks or tools.  Regularly check to see if handrails are loose or broken. Make sure that both sides of any steps have handrails.  Any raised decks and porches should have guardrails on the edges.  Have any leaves, snow, or ice cleared regularly.  Use sand or salt on walking paths during winter.  Clean up any spills in your garage right away. This includes oil or grease spills. What can I do in the bathroom?  Use night lights.  Install  grab bars by the toilet and in the tub and shower. Do not use towel bars as grab bars.  Use non-skid mats or decals in the tub or shower.  If you need to sit down in the shower, use a plastic, non-slip stool.  Keep the floor dry. Clean up any water that spills on the floor as soon as it happens.  Remove soap buildup in the tub or shower regularly.  Attach bath mats securely with double-sided non-slip rug tape.  Do not have throw rugs and other things on the floor that can make you trip. What can I do in the bedroom?  Use night  lights.  Make sure that you have a light by your bed that is easy to reach.  Do not use any sheets or blankets that are too big for your bed. They should not hang down onto the floor.  Have a firm chair that has side arms. You can use this for support while you get dressed.  Do not have throw rugs and other things on the floor that can make you trip. What can I do in the kitchen?  Clean up any spills right away.  Avoid walking on wet floors.  Keep items that you use a lot in easy-to-reach places.  If you need to reach something above you, use a strong step stool that has a grab bar.  Keep electrical cords out of the way.  Do not use floor polish or wax that makes floors slippery. If you must use wax, use non-skid floor wax.  Do not have throw rugs and other things on the floor that can make you trip. What can I do with my stairs?  Do not leave any items on the stairs.  Make sure that there are handrails on both sides of the stairs and use them. Fix handrails that are broken or loose. Make sure that handrails are as long as the stairways.  Check any carpeting to make sure that it is firmly attached to the stairs. Fix any carpet that is loose or worn.  Avoid having throw rugs at the top or bottom of the stairs. If you do have throw rugs, attach them to the floor with carpet tape.  Make sure that you have a light switch at the top of the stairs and the bottom of the stairs. If you do not have them, ask someone to add them for you. What else can I do to help prevent falls?  Wear shoes that:  Do not have high heels.  Have rubber bottoms.  Are comfortable and fit you well.  Are closed at the toe. Do not wear sandals.  If you use a stepladder:  Make sure that it is fully opened. Do not climb a closed stepladder.  Make sure that both sides of the stepladder are locked into place.  Ask someone to hold it for you, if possible.  Clearly mark and make sure that you can  see:  Any grab bars or handrails.  First and last steps.  Where the edge of each step is.  Use tools that help you move around (mobility aids) if they are needed. These include:  Canes.  Walkers.  Scooters.  Crutches.  Turn on the lights when you go into a dark area. Replace any light bulbs as soon as they burn out.  Set up your furniture so you have a clear path. Avoid moving your furniture around.  If any of your floors are uneven,  fix them.  If there are any pets around you, be aware of where they are.  Review your medicines with your doctor. Some medicines can make you feel dizzy. This can increase your chance of falling. Ask your doctor what other things that you can do to help prevent falls. This information is not intended to replace advice given to you by your health care provider. Make sure you discuss any questions you have with your health care provider. Document Released: 05/06/2009 Document Revised: 12/16/2015 Document Reviewed: 08/14/2014  2017 Elsevier  Health Maintenance, Male A healthy lifestyle and preventative care can promote health and wellness.  Maintain regular health, dental, and eye exams.  Eat a healthy diet. Foods like vegetables, fruits, whole grains, low-fat dairy products, and lean protein foods contain the nutrients you need and are low in calories. Decrease your intake of foods high in solid fats, added sugars, and salt. Get information about a proper diet from your health care provider, if necessary.  Regular physical exercise is one of the most important things you can do for your health. Most adults should get at least 150 minutes of moderate-intensity exercise (any activity that increases your heart rate and causes you to sweat) each week. In addition, most adults need muscle-strengthening exercises on 2 or more days a week.   Maintain a healthy weight. The body mass index (BMI) is a screening tool to identify possible weight problems. It  provides an estimate of body fat based on height and weight. Your health care provider can find your BMI and can help you achieve or maintain a healthy weight. For males 20 years and older:  A BMI below 18.5 is considered underweight.  A BMI of 18.5 to 24.9 is normal.  A BMI of 25 to 29.9 is considered overweight.  A BMI of 30 and above is considered obese.  Maintain normal blood lipids and cholesterol by exercising and minimizing your intake of saturated fat. Eat a balanced diet with plenty of fruits and vegetables. Blood tests for lipids and cholesterol should begin at age 31 and be repeated every 5 years. If your lipid or cholesterol levels are high, you are over age 107, or you are at high risk for heart disease, you may need your cholesterol levels checked more frequently.Ongoing high lipid and cholesterol levels should be treated with medicines if diet and exercise are not working.  If you smoke, find out from your health care provider how to quit. If you do not use tobacco, do not start.  Lung cancer screening is recommended for adults aged 67-80 years who are at high risk for developing lung cancer because of a history of smoking. A yearly low-dose CT scan of the lungs is recommended for people who have at least a 30-pack-year history of smoking and are current smokers or have quit within the past 15 years. A pack year of smoking is smoking an average of 1 pack of cigarettes a day for 1 year (for example, a 30-pack-year history of smoking could mean smoking 1 pack a day for 30 years or 2 packs a day for 15 years). Yearly screening should continue until the smoker has stopped smoking for at least 15 years. Yearly screening should be stopped for people who develop a health problem that would prevent them from having lung cancer treatment.  If you choose to drink alcohol, do not have more than 2 drinks per day. One drink is considered to be 12 oz (360 mL) of beer,  5 oz (150 mL) of wine, or 1.5  oz (45 mL) of liquor.  Avoid the use of street drugs. Do not share needles with anyone. Ask for help if you need support or instructions about stopping the use of drugs.  High blood pressure causes heart disease and increases the risk of stroke. High blood pressure is more likely to develop in:  People who have blood pressure in the end of the normal range (100-139/85-89 mm Hg).  People who are overweight or obese.  People who are African American.  If you are 23-23 years of age, have your blood pressure checked every 3-5 years. If you are 67 years of age or older, have your blood pressure checked every year. You should have your blood pressure measured twice-once when you are at a hospital or clinic, and once when you are not at a hospital or clinic. Record the average of the two measurements. To check your blood pressure when you are not at a hospital or clinic, you can use:  An automated blood pressure machine at a pharmacy.  A home blood pressure monitor.  If you are 63-70 years old, ask your health care provider if you should take aspirin to prevent heart disease.  Diabetes screening involves taking a blood sample to check your fasting blood sugar level. This should be done once every 3 years after age 95 if you are at a normal weight and without risk factors for diabetes. Testing should be considered at a younger age or be carried out more frequently if you are overweight and have at least 1 risk factor for diabetes.  Colorectal cancer can be detected and often prevented. Most routine colorectal cancer screening begins at the age of 91 and continues through age 37. However, your health care provider may recommend screening at an earlier age if you have risk factors for colon cancer. On a yearly basis, your health care provider may provide home test kits to check for hidden blood in the stool. A small camera at the end of a tube may be used to directly examine the colon (sigmoidoscopy or  colonoscopy) to detect the earliest forms of colorectal cancer. Talk to your health care provider about this at age 21 when routine screening begins. A direct exam of the colon should be repeated every 5-10 years through age 4, unless early forms of precancerous polyps or small growths are found.  People who are at an increased risk for hepatitis B should be screened for this virus. You are considered at high risk for hepatitis B if:  You were born in a country where hepatitis B occurs often. Talk with your health care provider about which countries are considered high risk.  Your parents were born in a high-risk country and you have not received a shot to protect against hepatitis B (hepatitis B vaccine).  You have HIV or AIDS.  You use needles to inject street drugs.  You live with, or have sex with, someone who has hepatitis B.  You are a man who has sex with other men (MSM).  You get hemodialysis treatment.  You take certain medicines for conditions like cancer, organ transplantation, and autoimmune conditions.  Hepatitis C blood testing is recommended for all people born from [birth year] through 1963 and any individual with known risk factors for hepatitis C.  Healthy men should no longer receive prostate-specific antigen (PSA) blood tests as part of routine cancer screening. Talk to your health care provider about prostate cancer  screening.  Testicular cancer screening is not recommended for adolescents or adult males who have no symptoms. Screening includes self-exam, a health care provider exam, and other screening tests. Consult with your health care provider about any symptoms you have or any concerns you have about testicular cancer.  Practice safe sex. Use condoms and avoid high-risk sexual practices to reduce the spread of sexually transmitted infections (STIs).  You should be screened for STIs, including gonorrhea and chlamydia if:  You are sexually active and are younger than  24 years.  You are older than 24 years, and your health care provider tells you that you are at risk for this type of infection.  Your sexual activity has changed since you were last screened, and you are at an increased risk for chlamydia or gonorrhea. Ask your health care provider if you are at risk.  If you are at risk of being infected with HIV, it is recommended that you take a prescription medicine daily to prevent HIV infection. This is called pre-exposure prophylaxis (PrEP). You are considered at risk if:  You are a man who has sex with other men (MSM).  You are a heterosexual man who is sexually active with multiple partners.  You take drugs by injection.  You are sexually active with a partner who has HIV.  Talk with your health care provider about whether you are at high risk of being infected with HIV. If you choose to begin PrEP, you should first be tested for HIV. You should then be tested every 3 months for as long as you are taking PrEP.  Use sunscreen. Apply sunscreen liberally and repeatedly throughout the day. You should seek shade when your shadow is shorter than you. Protect yourself by wearing long sleeves, pants, a wide-brimmed hat, and sunglasses year round whenever you are outdoors.  Tell your health care provider of new moles or changes in moles, especially if there is a change in shape or color. Also, tell your health care provider if a mole is larger than the size of a pencil eraser.  A one-time screening for abdominal aortic aneurysm (AAA) and surgical repair of large AAAs by ultrasound is recommended for men aged 52-75 years who are current or former smokers.  Stay current with your vaccines (immunizations). This information is not intended to replace advice given to you by your health care provider. Make sure you discuss any questions you have with your health care provider. Document Released: 01/06/2008 Document Revised: 07/31/2014 Document Reviewed:  04/13/2015 Elsevier Interactive Patient Education  2017 [employer].

## 2016-06-26 NOTE — Telephone Encounter (Signed)
RF request for sertraline LOV: 03/14/16 Next ov: 07/03/16 Last written: 12/27/15 #90 w/ 1RF

## 2016-06-26 NOTE — Progress Notes (Signed)
AWV reviewed and agree.  Signed:  Crissie Sickles, MD           06/26/2016

## 2016-06-27 ENCOUNTER — Telehealth: Payer: Self-pay | Admitting: Cardiovascular Disease

## 2016-06-27 ENCOUNTER — Telehealth: Payer: Self-pay | Admitting: Family Medicine

## 2016-06-27 NOTE — Telephone Encounter (Signed)
Pt would like to know if there is a glucometer available at the office that he can have.  He is in need of a new one.

## 2016-06-27 NOTE — Telephone Encounter (Signed)
New message  Pt is returning chelly's call  Please call back

## 2016-06-28 MED ORDER — ONETOUCH LANCETS MISC: 0 refills | Status: DC

## 2016-06-28 MED ORDER — GLUCOSE BLOOD VI STRP: ORAL_STRIP | 1 refills | Status: DC

## 2016-06-28 NOTE — Telephone Encounter (Signed)
Spoke to pts wife and advised her that we only had the [employer]. I advised her that I would print Rx for test strips and lancets but I wasn't sure if pts insurance would cover these test strips. She voiced understanding, okay per DPR.

## 2016-06-30 DIAGNOSIS — Z01818 Encounter for other preprocedural examination: Secondary | ICD-10-CM | POA: Diagnosis not present

## 2016-06-30 DIAGNOSIS — M1990 Unspecified osteoarthritis, unspecified site: Secondary | ICD-10-CM | POA: Diagnosis not present

## 2016-06-30 DIAGNOSIS — I509 Heart failure, unspecified: Secondary | ICD-10-CM | POA: Diagnosis not present

## 2016-06-30 DIAGNOSIS — Z79899 Other long term (current) drug therapy: Secondary | ICD-10-CM | POA: Diagnosis not present

## 2016-06-30 DIAGNOSIS — E119 Type 2 diabetes mellitus without complications: Secondary | ICD-10-CM | POA: Diagnosis not present

## 2016-06-30 DIAGNOSIS — I4891 Unspecified atrial fibrillation: Secondary | ICD-10-CM | POA: Diagnosis not present

## 2016-06-30 DIAGNOSIS — Z01812 Encounter for preprocedural laboratory examination: Secondary | ICD-10-CM | POA: Diagnosis not present

## 2016-06-30 DIAGNOSIS — I428 Other cardiomyopathies: Secondary | ICD-10-CM | POA: Diagnosis not present

## 2016-06-30 DIAGNOSIS — M19012 Primary osteoarthritis, left shoulder: Secondary | ICD-10-CM | POA: Diagnosis not present

## 2016-06-30 DIAGNOSIS — Z7901 Long term (current) use of anticoagulants: Secondary | ICD-10-CM | POA: Diagnosis not present

## 2016-07-03 ENCOUNTER — Ambulatory Visit (INDEPENDENT_AMBULATORY_CARE_PROVIDER_SITE_OTHER): Payer: Medicare Other | Admitting: Family Medicine

## 2016-07-03 ENCOUNTER — Encounter: Payer: Self-pay | Admitting: Family Medicine

## 2016-07-03 VITALS — BP 127/86 | HR 79 | Temp 97.9°F | Resp 16 | Ht 71.0 in | Wt 229.5 lb

## 2016-07-03 DIAGNOSIS — I10 Essential (primary) hypertension: Secondary | ICD-10-CM | POA: Diagnosis not present

## 2016-07-03 DIAGNOSIS — E7800 Pure hypercholesterolemia, unspecified: Secondary | ICD-10-CM | POA: Diagnosis not present

## 2016-07-03 DIAGNOSIS — K130 Diseases of lips: Secondary | ICD-10-CM

## 2016-07-03 DIAGNOSIS — E118 Type 2 diabetes mellitus with unspecified complications: Secondary | ICD-10-CM | POA: Diagnosis not present

## 2016-07-03 LAB — HEMOGLOBIN A1C: Hgb A1c MFr Bld: 7.8 % — ABNORMAL HIGH (ref 4.6–6.5)

## 2016-07-03 LAB — LIPID PANEL
Cholesterol: 148 mg/dL (ref 0–200)
HDL: 41.4 mg/dL (ref >39.00)
LDL Cholesterol: 72 mg/dL (ref 0–99)
NonHDL: 106.72
Total CHOL/HDL Ratio: 4
Triglycerides: 172 mg/dL — ABNORMAL HIGH (ref 0.0–149.0)
VLDL: 34.4 mg/dL (ref 0.0–40.0)

## 2016-07-03 MED ORDER — GERHARDT'S BUTT CREAM: TOPICAL_CREAM | CUTANEOUS | 1 refills | Status: DC

## 2016-07-03 MED ORDER — NYSTATIN 100000 UNIT/GM EX CREA: 1.0000 "application " | TOPICAL_CREAM | Freq: Three times a day (TID) | CUTANEOUS | 1 refills | Status: DC

## 2016-07-03 NOTE — Progress Notes (Signed)
OFFICE VISIT  07/03/2016   CC:  Chief Complaint  Patient presents with  . Follow-up    Pt is fasting.    HPI:    Patient is a 75 y.o. Caucasian male who presents for 3 mo f/u DM 2, HTN, CRI stage III (GFR @ 60 ml/min). He has chronic a-fib with chronic combined systolic and diastolic HF, has a pacemaker, has plans to get upgrade of the pacer to CRT pacer after his upcoming shoulder surgery.  DM: no working glucometer right now.  Compliant with meds.  HTN: recent change from losartan to entresto in the last couple weeks.  Says home bps 130s-140 over 80s.  HLD: compliant with statin, fasting for recheck today.  Denies side effect from med.  Says corners of mouth are irritated, hurt when he opens his mouth wide.  Applying otc antibacterial as well as occlusive/emollient.  Even tried abreva.  Going on a couple of weeks now at least.  No signif itching.  Past Medical History:  Diagnosis Date  . Arthritis    left shoulder (Dr. [name]): steroid injection 04/03/16.  Plan for TSA surgery 06/2016.  [name] BPH (benign prostatic hypertrophy) 06/2011   Irritative sx's; pt declined trial of anticholinergic per Urology records  . Chronic combined systolic and diastolic heart failure ([place]) 05/31/2012   EF 40-45%, LA mod-severe dilated, AFIB.   02/2016 EF 40%, diffuse hypokinesis, grade 2 DD.  Myoc perf imaging showed EF 32% 04/2016.  Pt being considered for CRT-D as of 05/2016.  [name] Chronic low back pain   . Chronic renal insufficiency, stage III (moderate) 2015   CrCl about 60 ml/min  . Depression   . Diabetes mellitus   . Episodic low back pain 01/22/2013   w/intermittent radiculitis (12/2014 his neurologist referred him to pain mgmt for epidural steroid injection)  . GERD (gastroesophageal reflux disease)   . H/O tilt table evaluation 11/02/05   negative  . Helicobacter pylori gastritis 01/2016  . History of adenomatous polyp of colon 10/12/11   Dr. [name] (3 right side of colon- tubular  adenomas removed)  . History of cardiovascular stress test 05/28/12   no ischemia, EF 37%, imaging results are unchanged and within normal variance  . History of chronic prostatitis   . History of retinal detachment    OS  . History of vertigo    + Hx of posterior HA's.  Neuro (Dr. Erling Cruz) eval 2011.  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  . Hypertension   . Lumbar spondylosis    lumbosacral radiculopathy at L4 by EMG testing, right foot drop (neurologist is Dr. Linus Salmons with [facility] in [place])--neurologist referred him to neurosurgery  . Nephrolithiasis 07/2012   Left UVJ 2 mm stone with dilation of renal collecting system and slight hydroureter on right  . 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  Since no CAD on cath 05/2016, cards recommends upgrade to CRT-D.  [name] Permanent atrial fibrillation ([place])    DCCV 07/09/13-converted, lasted two days, then back into afib--needs lifetime anticoagulation (Xarelto as of 09/2014)  . Shortness of breath     Past Surgical History:  Procedure Laterality Date  . CARDIAC CATHETERIZATION N/A 06/14/2016   Minimal nonobstructive dz, EF 25-35%.  Procedure: Left Heart Cath and Coronary Angiography;  Surgeon: Peter M Martinique, MD;  Location: [facility];  Service: Cardiovascular;  Laterality: N/A;  . CARDIOVASCULAR STRESS  TEST  2012   2012 nuclear perfusion study: low risk scan; 04/2016 normal myocardial perfusion imaging, EF 32%.  [name] CARDIOVERSION  07/09/2012   Procedure: CARDIOVERSION;  Surgeon: [name], MD;  Location: [facility] ENDOSCOPY;  Service: Cardiovascular;  Laterality: N/A;  . COLONOSCOPY W/ POLYPECTOMY  approx 2006; repeated 09/2011   Polyps on 2013 EGD as well, repeat 12/2014  . ESOPHAGOGASTRODUODENOSCOPY  10/18/06   Done due to chronic GERD: Normal, bx showed no barrett's esophagus (Dr. [name])  .  EYE SURGERY     retinal detachment OS+ cataract surgery  . INSERT / REPLACE / REMOVE PACEMAKER  02/05/2012   dual chamber, sinus node dysfunction, sinus arrest, PAF, Medtronic Revo serial#[id number] H: last checked 05/2015  . LUMBAR LAMINECTOMY  1976   left L4-5  . PERMANENT PACEMAKER INSERTION N/A 02/05/2012   Procedure: PERMANENT PACEMAKER INSERTION;  Surgeon: [name], MD; Generator Medtronic Taylor model [place] serial number [id number] H Laterality: N/A;  . TRANSTHORACIC ECHOCARDIOGRAM  08/25/10; 05/2012; 03/23/16   mild asymmetric LVH, normal systolic function, normal diastolic fxn, mild-to-mod mitral regurg, mild aortic valve sclerosis and trace AI, mild aortic root dilatation. 2014 f/u showed EF 40-45%, mod LAE, A FIB.  02/2016 EF 40%, diffuse hypokinesis, grade 2 DD.    Outpatient Medications Prior to Visit  Medication Sig Dispense Refill  . colchicine 0.6 MG tablet Take 1 tablet (0.6 mg total) by mouth daily. (Patient taking differently: Take 0.6 mg by mouth daily as needed (gout flare). ) 15 tablet 0  . COMBIGAN 0.2-0.5 % ophthalmic solution Place 1 drop into both eyes 2 (two) times daily.     [name] gabapentin (NEURONTIN) 300 MG capsule TAKE 1 CAPSULE THREE TIMES A DAY 270 capsule 3  . GLIPIZIDE XL 5 MG 24 hr tablet TAKE 1 TABLET EVERY MORNING 90 tablet 1  . glucose blood test strip Use as instructed 100 each 3  . glucose blood test strip Use to check blood sugars 1-2 times daily 100 each 1  . JANUVIA 100 MG tablet TAKE 1 TABLET DAILY 90 tablet 1  . Lancets (PRECISION ULTRA LANCET) MISC Use as instructed 100 each 3  . latanoprost (XALATAN) 0.005 % ophthalmic solution Place 1 drop into both eyes at bedtime.   12  . metFORMIN (GLUCOPHAGE) 1000 MG tablet Take 1,000 mg by mouth 2 (two) times daily with a meal.     . metoprolol succinate (TOPROL-XL) 100 MG 24 hr tablet 1 and 1/2 tabs po qd 135 tablet 3  . omeprazole (PRILOSEC) 40 MG capsule Take 1 capsule (40 mg total) by mouth daily. (Patient  taking differently: Take 40 mg by mouth daily as needed (indigestion). ) 90 capsule 3  . ONE TOUCH LANCETS MISC Use to check blood sugar 1-2 times daily 200 each 0  . rOPINIRole (REQUIP) 1 MG tablet TAKE 1 TABLET DAILY (NEED OFFICE VISIT FOR MORE REFILLS) (Patient taking differently: Take 1mg  at bedtime) 90 tablet 1  . sacubitril-valsartan (ENTRESTO) 49-51 MG Take 1 tablet by mouth 2 (two) times daily. 28 tablet 0  . sertraline (ZOLOFT) 100 MG tablet TAKE 1 TABLET DAILY 90 tablet 1  . simvastatin (ZOCOR) 20 MG tablet TAKE 1 TABLET EVERY EVENING 90 tablet 1  . XARELTO 20 MG TABS tablet TAKE 1 TABLET DAILY WITH SUPPER 90 tablet 1  . losartan (COZAAR) 100 MG tablet TAKE 1 TABLET DAILY (Patient not taking: Reported on 07/03/2016) 90 tablet 1   No facility-administered medications prior to visit.     No Known  Allergies  ROS As per HPI  PE: Blood pressure 127/86, pulse 79, temperature 97.9 F (36.6 C), temperature source Oral, resp. rate 16, height 5\' 11"  (1.803 m), weight 229 lb 8 oz (104.1 kg), SpO2 97 %. Gen: Alert, well appearing.  Patient is oriented to person, place, time, and situation. AFFECT: pleasant, lucid thought and speech. ENT: corners of mouth/lips with pinkish irritated rash.  NO vesicles or pustules.  Oral mucosa normal. CV: RRR, no m/r/g.   LUNGS: CTA bilat, nonlabored resps, good aeration in all lung fields.   LABS:  Lab Results  Component Value Date   TSH 3.37 06/06/2016   Lab Results  Component Value Date   WBC 5.4 06/06/2016   HGB 14.3 06/06/2016   HCT 42.6 06/06/2016   MCV 90.8 06/06/2016   PLT 177 06/06/2016   Lab Results  Component Value Date   CREATININE 1.21 (H) 06/06/2016   BUN 16 06/06/2016   NA 137 06/06/2016   K 4.4 06/06/2016   CL 101 06/06/2016   CO2 28 06/06/2016   Lab Results  Component Value Date   ALT 17 02/21/2016   AST 17 02/21/2016   ALKPHOS 78 02/21/2016   BILITOT 0.7 02/21/2016   Lab Results  Component Value Date   CHOL 128  09/20/2015   Lab Results  Component Value Date   HDL 43.80 09/20/2015   Lab Results  Component Value Date   LDLCALC 63 09/20/2015   Lab Results  Component Value Date   TRIG 109.0 09/20/2015   Lab Results  Component Value Date   CHOLHDL 3 09/20/2015   Lab Results  Component Value Date   HGBA1C 6.8 03/14/2016   IMPRESSION AND PLAN:  1) DM 2, good control historically. He's working on getting a new/working glucometer so he can continue home monitoring. HbA1c today.  2) HTN; The current medical regimen is effective;  continue present plan and medications. Pt says better on entresto.  Recent lytes/cr stable.  3) Hyperlipidemia: tolerating statin.  Will check FLP today.  Last AST/ALT 6 mo ago were normal.  4) CRI stage II/III: recent lytes/cr stable less than a month ago.  5) Chronic L shoulder pain: for TSR soon with ortho.  6) Chronic a-fib, with chronic combined systolic/diast HF: for upgrade of his pacer to CRT-P after shoulder surgery.  7) Angular cheilitis: check zinc level.  Trial of nystatin cream tid, then apply aquafor occlusive tid.  An After Visit Summary was printed and given to the patient.  FOLLOW UP: Return in about 3 months (around 10/01/2016) for routine chronic illness f/u.  Signed:  Crissie Sickles, MD           07/03/2016

## 2016-07-03 NOTE — Progress Notes (Signed)
Pre visit review using our clinic review tool, if applicable. No additional management support is needed unless otherwise documented below in the visit note. 

## 2016-07-03 NOTE — Patient Instructions (Signed)
After applying nystatin cream, apply otc aquafor to the affected areas.

## 2016-07-04 ENCOUNTER — Ambulatory Visit (INDEPENDENT_AMBULATORY_CARE_PROVIDER_SITE_OTHER): Payer: Medicare Other | Admitting: Pharmacist Clinician (PhC)/ Clinical Pharmacy Specialist

## 2016-07-04 VITALS — BP 134/92 | HR 74

## 2016-07-04 DIAGNOSIS — I519 Heart disease, unspecified: Secondary | ICD-10-CM

## 2016-07-04 MED ORDER — SACUBITRIL-VALSARTAN 97-103 MG PO TABS: 1.0000 | ORAL_TABLET | Freq: Two times a day (BID) | ORAL | 0 refills | Status: DC

## 2016-07-04 NOTE — Progress Notes (Signed)
Patient ID: Mason [name]                 DOB: 31-Jan-1941                      MRN: [id number]     HPI: Mason [name] is a 75 yo male with PMI positive for Afib, HF with reduced EF = 32% , CKD, DM and hypertension. Presents to pharmacist clinic today for Estresto titration.   Patient converted from losartan 100 mg to Entresto 49/51 on Nov/29/2017 (2 weeks ago).  Estimated CrCl = 63ml/min (cockcroft-Gault; IBW) with most recent BMET on 06/06/16.  Patient denies headaches, dizziness or episodes of low blood pressure at home. Tolerating all medication without problems.  Current HTN meds: Entresto 49/51 BID, metoprolol succinate 10 mg daily  Previously tried: Losartan 100mg    BP goal: 130/80  Social History: negative for cigarettes or smokeless tobacco;  beer 2-3x per week ; no illicit drugs  Family History:  The patient's family history includes Heart failure in his mother; Stroke in his father and mother.   Diet: no salt   Home BP readings: 112/82 ; 127/87 ;pulse in 70s   Wt Readings from Last 3 Encounters:  07/03/16 229 lb 8 oz (104.1 kg)  06/26/16 225 lb (102.1 kg)  06/14/16 230 lb (104.3 kg)   BP Readings from Last 3 Encounters:  07/04/16 (!) 134/92  07/03/16 127/86  06/26/16 114/78   Pulse Readings from Last 3 Encounters:  07/04/16 74  07/03/16 79  06/26/16 77     Past Medical History:  Diagnosis Date  . Arthritis    left shoulder (Dr. [name]): steroid injection 04/03/16.  Plan for TSA surgery 06/2016.  [name] BPH (benign prostatic hypertrophy) 06/2011   Irritative sx's; pt declined trial of anticholinergic per Urology records  . Chronic combined systolic and diastolic heart failure ([place]) 05/31/2012   EF 40-45%, LA mod-severe dilated, AFIB.   02/2016 EF 40%, diffuse hypokinesis, grade 2 DD.  Myoc perf imaging showed EF 32% 04/2016.  Pt being considered for CRT-D as of 05/2016.  [name] Chronic low back pain   . Chronic renal insufficiency, stage III (moderate) 2015   CrCl  about 60 ml/min  . Depression   . Diabetes mellitus   . Episodic low back pain 01/22/2013   w/intermittent radiculitis (12/2014 his neurologist referred him to pain mgmt for epidural steroid injection)  . GERD (gastroesophageal reflux disease)   . H/O tilt table evaluation 11/02/05   negative  . Helicobacter pylori gastritis 01/2016  . History of adenomatous polyp of colon 10/12/11   Dr. [name] (3 right side of colon- tubular adenomas removed)  . History of cardiovascular stress test 05/28/12   no ischemia, EF 37%, imaging results are unchanged and within normal variance  . History of chronic prostatitis   . History of retinal detachment    OS  . History of vertigo    + Hx of posterior HA's.  Neuro (Dr. Erling Cruz) eval 2011.  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  . Hypertension   . Lumbar spondylosis    lumbosacral radiculopathy at L4 by EMG testing, right foot drop (neurologist is Dr. Linus Salmons with [facility] in [place])--neurologist referred him to neurosurgery  . Nephrolithiasis 07/2012   Left UVJ 2 mm stone with dilation of renal collecting system and slight hydroureter on right  . 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  Since no CAD on cath 05/2016, cards recommends upgrade to CRT-D.  [name] Permanent atrial fibrillation ([place])    DCCV 07/09/13-converted, lasted two days, then back into afib--needs lifetime anticoagulation (Xarelto as of 09/2014)  . Shortness of breath     Current Outpatient Prescriptions on File Prior to Visit  Medication Sig Dispense Refill  . colchicine 0.6 MG tablet Take 1 tablet (0.6 mg total) by mouth daily. (Patient taking differently: Take 0.6 mg by mouth daily as needed (gout flare). ) 15 tablet 0  . colchicine 0.6 MG tablet Take 0.6 mg by mouth as needed.    . COMBIGAN 0.2-0.5 % ophthalmic solution Place 1 drop into both  eyes 2 (two) times daily.     [name] gabapentin (NEURONTIN) 300 MG capsule TAKE 1 CAPSULE THREE TIMES A DAY 270 capsule 3  . GLIPIZIDE XL 5 MG 24 hr tablet TAKE 1 TABLET EVERY MORNING 90 tablet 1  . glucose blood test strip Use as instructed 100 each 3  . glucose blood test strip Use to check blood sugars 1-2 times daily 100 each 1  . ibuprofen (ADVIL,MOTRIN) 200 MG tablet Take 200 mg by mouth every 6 (six) hours as needed.    [name] JANUVIA 100 MG tablet TAKE 1 TABLET DAILY 90 tablet 1  . Lancets (PRECISION ULTRA LANCET) MISC Use as instructed 100 each 3  . latanoprost (XALATAN) 0.005 % ophthalmic solution Place 1 drop into both eyes at bedtime.   12  . metFORMIN (GLUCOPHAGE) 1000 MG tablet Take 1,000 mg by mouth 2 (two) times daily with a meal.     . metoprolol succinate (TOPROL-XL) 100 MG 24 hr tablet 1 and 1/2 tabs po qd 135 tablet 3  . nystatin cream (MYCOSTATIN) Apply 1 application topically 3 (three) times daily. 15 g 1  . omeprazole (PRILOSEC) 40 MG capsule Take 1 capsule (40 mg total) by mouth daily. (Patient taking differently: Take 40 mg by mouth daily as needed (indigestion). ) 90 capsule 3  . ONE TOUCH LANCETS MISC Use to check blood sugar 1-2 times daily 200 each 0  . rOPINIRole (REQUIP) 1 MG tablet TAKE 1 TABLET DAILY (NEED OFFICE VISIT FOR MORE REFILLS) (Patient taking differently: Take 1mg  at bedtime) 90 tablet 1  . sertraline (ZOLOFT) 100 MG tablet TAKE 1 TABLET DAILY 90 tablet 1  . simvastatin (ZOCOR) 20 MG tablet TAKE 1 TABLET EVERY EVENING 90 tablet 1  . XARELTO 20 MG TABS tablet TAKE 1 TABLET DAILY WITH SUPPER 90 tablet 1   No current facility-administered medications on file prior to visit.     No Known Allergies  Assessment/Plan:  1. Entresto titration -  Patient tolerating current Entresto dose without problems. Blood pressure within acceptable range with (SBP >100), no episodes of hypotension, appropriate renal function with est CrCl = 58 ml/min, and office BP 134/92. Will  increase Entresto dose to 97mg /103mg  to optimize therapy and repeat BMET at next office visit in 3 weeks.  Patient to see cardiologist on Jan/12/17, will follow up with pharmacist clinic at the same time.

## 2016-07-04 NOTE — Patient Instructions (Addendum)
1. Blood pressure today 134/92  2. Keep log of blood pressure  3. Start Entresto 97/103 mg twice daily   4. Follow up with clinic in 3 weeks

## 2016-07-05 LAB — ZINC: Zinc: 66 ug/dL (ref 60–130)

## 2016-07-06 ENCOUNTER — Telehealth: Payer: Self-pay | Admitting: Cardiovascular Disease

## 2016-07-06 ENCOUNTER — Telehealth: Payer: Self-pay | Admitting: Family Medicine

## 2016-07-06 MED ORDER — BLOOD GLUCOSE MONITOR KIT: PACK | 0 refills | Status: DC

## 2016-07-06 NOTE — Telephone Encounter (Signed)
Rx printed/signed and put up front for p/u. Pts wife advised and voiced understanding, okay per DPR.

## 2016-07-06 NOTE — Telephone Encounter (Signed)
Patient calling to request hard copy script for new glucometer.

## 2016-07-07 ENCOUNTER — Other Ambulatory Visit: Payer: Self-pay | Admitting: *Deleted

## 2016-07-07 ENCOUNTER — Telehealth: Payer: Self-pay | Admitting: Cardiovascular Disease

## 2016-07-07 ENCOUNTER — Encounter: Payer: Self-pay | Admitting: Cardiovascular Disease

## 2016-07-07 MED ORDER — SACUBITRIL-VALSARTAN 97-103 MG PO TABS: 1.0000 | ORAL_TABLET | Freq: Two times a day (BID) | ORAL | 1 refills | Status: DC

## 2016-07-07 NOTE — Telephone Encounter (Signed)
New message  Returning info for Mason [name]  [phone] fax  Pt needs Cardiac clearance for surgery  No medication clearance/has been taken care of

## 2016-07-07 NOTE — Telephone Encounter (Signed)
Lm for Angie to call back.

## 2016-07-07 NOTE — Telephone Encounter (Signed)
Spoke to patient. Notes he is out of entresto, took last dose last night. He's not sure that he can afford the copay for the 97-103 strength, also not sure what the copay on the 49-51 dose is, as he's been provided samples of this med to start with. He informs me he will need something sent to [facility] to see if he can even afford the med, or is going to go back on losartan. Notes that [facility] will take a while to process this, asking what can be done in interim to continue medication?  Aware I will defer to pharmD to review.  States if unreachable at home or on cell phone, not to leave message, since their VM is messed up - please call wife instead at [phone].  Thanks

## 2016-07-07 NOTE — Telephone Encounter (Signed)
Free trial for 30 day supply called to pharmacy using offer card.  Patient aware.

## 2016-07-07 NOTE — Telephone Encounter (Signed)
Already a letter in the system dated November 22, addressed to Dr. [name]. We need to let the Medtronic pacemaker rep no that this patient's device needs to be reprogrammed VOO before he has his shoulder surgery. They need to know where the surgery is being performed and at what time.

## 2016-07-07 NOTE — Telephone Encounter (Signed)
I'm handling Rx for entresto. See request from surgical wellness regarding signature. Will cc Chelley on this message.

## 2016-07-07 NOTE — Telephone Encounter (Addendum)
Request for surgical clearance:  1. What type of surgery is being performed? Left shoulder replacement  2. When is this surgery scheduled? 07/21/16  3. Are there any medications that need to be held prior to surgery and how long? Caller reported none - this should be reviewed though, as patient is on Xarelto and would need cardiology OK to interrupt  4. Name of physician performing surgery? Dr. Chevis Pretty  5. What is your office phone and fax number?  Phone: [phone] Fax: [phone]  Patient was a conditional surgical clearance pending outcome of cath 06/14/16.

## 2016-07-07 NOTE — Telephone Encounter (Signed)
Sent RX to [facility] [employer] [facility] [employer] mail order) for cost verification. If he has not used 30-day free card we can get him this until [facility] is able to fill.   Alternatively, we have samples here for the 49-51mg  and he can take 2 of these to make dose close to 97-103mg  BID since he is completely out of medication.

## 2016-07-07 NOTE — Telephone Encounter (Signed)
New message       Request for surgical clearance:  1. What type of surgery is being performed?  Left shoulder replacement  When is this surgery scheduled? 07-21-16 2. Are there any medications that need to be held prior to surgery and how long?  Need cardiac clearance--they will take care of holding meds  Name of physician performing surgery? Dr Dub Mikes 3. What is your office phone and fax number? Verbal is ok

## 2016-07-07 NOTE — Telephone Encounter (Signed)
Note faxed.

## 2016-07-07 NOTE — Telephone Encounter (Signed)
I called back, got VM message. Left msg to call. Need fax number as well as list of meds they would like to hold so that this can be reviewed by provider.

## 2016-07-07 NOTE — Telephone Encounter (Signed)
New message  Pt c/o medication issue:  1. Name of Medication: sacubitril-valsartan (Entresto) 97-103 mg take twice daily  2. How are you currently taking this medication (dosage and times per day)? See above  3. Are you having a reaction (difficulty breathing--STAT)? No  4. What is your medication issue? Pt voiced he tried to get this medication at pharmacy but he was informed it has to go through [facility] before they can dispense due to the cost of the medication.   Please f/u with pt

## 2016-07-07 NOTE — Telephone Encounter (Signed)
New message    Janace Hoard is calling to speak to rn about the fax that neededs to be signed by Dr.C pertaining to the pt device

## 2016-07-07 NOTE — Telephone Encounter (Signed)
Duplicate note closed.

## 2016-07-10 NOTE — Telephone Encounter (Signed)
Returned call. Patient's surgery is scheduled tentatively at 0815h on 07/21/16. Won't know exact time until the day before surgery. Patient will need device programming during the perioperative period and a device rep will need to be present. Called device clinic, spoke with [facility] and relayed information.  Number to reach pre-op is [phone] and will need to be called on 07/20/16 for exact time of surgery.

## 2016-07-10 NOTE — Telephone Encounter (Signed)
Follow up    Jody verbalized that Tte provider needs to sign page 2 on the device management for pre-opt

## 2016-07-10 NOTE — Telephone Encounter (Signed)
Angie calling back form surgical wellness regarding info on patients surgery: left shoulder replacement-07-21-16 with Dr. Chevis Pretty at [facility]

## 2016-07-10 NOTE — Telephone Encounter (Signed)
Message routed to [place], [place]

## 2016-07-11 NOTE — Telephone Encounter (Signed)
Please resend page 2 signed and dated asap.

## 2016-07-11 NOTE — Telephone Encounter (Signed)
Fax resent as requested.

## 2016-07-11 NOTE — Telephone Encounter (Signed)
Late Entry: Returned call to patient 06/27/16. Reviewed Dr Lurline Del recommendations (see telephone encounter from 06/21/16). Patient requested to see Dr C and discuss face-to-face. Appointment scheduled for 08/04/2016 at [place].

## 2016-07-19 ENCOUNTER — Telehealth: Payer: Self-pay | Admitting: Cardiovascular Disease

## 2016-07-19 NOTE — Telephone Encounter (Signed)
Follow up    Dr. [name] office is calling because she said that someone in device has to physically be present during procedure to tend to pt device

## 2016-07-19 NOTE — Telephone Encounter (Signed)
Spoke to [place] about patient's procedure. Sasha said that pt's procedure is scheduled for 12/29 @ 0815 at the [facility].  Dannial Monarch from [facility] notified.

## 2016-07-20 ENCOUNTER — Telehealth: Payer: Self-pay | Admitting: Cardiovascular Disease

## 2016-07-20 NOTE — Telephone Encounter (Signed)
Follow up   The procedure date has been changed to 07/27/2016 Thursday at 7am

## 2016-07-20 NOTE — Telephone Encounter (Signed)
Information sent to Mason [name], [facility]. sss

## 2016-07-24 DIAGNOSIS — Z95810 Presence of automatic (implantable) cardiac defibrillator: Secondary | ICD-10-CM

## 2016-07-24 HISTORY — DX: Presence of automatic (implantable) cardiac defibrillator: Z95.810

## 2016-07-24 HISTORY — PX: REVERSE SHOULDER ARTHROPLASTY: SHX5054

## 2016-07-26 ENCOUNTER — Telehealth (HOSPITAL_COMMUNITY): Payer: Self-pay | Admitting: Cardiovascular Disease

## 2016-07-26 NOTE — Telephone Encounter (Signed)
Spoke w/patien. Informed him he should come in for Jan 12 appt and his device can be checked then (appt is to discuss BiV upgrade) and he can disregard recall letter for pacer check for Feb.   Routed to C. Truitt, CMA as [employer]

## 2016-07-26 NOTE — Telephone Encounter (Signed)
Pt has appt with Dr. Loletha Grayer in Jan to discuss upgrade, then he got a letter to see Dr. Loletha Grayer in Feb for pacer ck, does he want to see pt then have device checked or have it checked before he see's him? pls call

## 2016-07-27 DIAGNOSIS — Z471 Aftercare following joint replacement surgery: Secondary | ICD-10-CM | POA: Diagnosis not present

## 2016-07-27 DIAGNOSIS — Z79899 Other long term (current) drug therapy: Secondary | ICD-10-CM | POA: Diagnosis not present

## 2016-07-27 DIAGNOSIS — E1140 Type 2 diabetes mellitus with diabetic neuropathy, unspecified: Secondary | ICD-10-CM | POA: Diagnosis present

## 2016-07-27 DIAGNOSIS — E669 Obesity, unspecified: Secondary | ICD-10-CM | POA: Diagnosis present

## 2016-07-27 DIAGNOSIS — I10 Essential (primary) hypertension: Secondary | ICD-10-CM | POA: Diagnosis present

## 2016-07-27 DIAGNOSIS — Z950 Presence of cardiac pacemaker: Secondary | ICD-10-CM | POA: Diagnosis not present

## 2016-07-27 DIAGNOSIS — M19012 Primary osteoarthritis, left shoulder: Secondary | ICD-10-CM | POA: Diagnosis not present

## 2016-07-27 DIAGNOSIS — Z6832 Body mass index (BMI) 32.0-32.9, adult: Secondary | ICD-10-CM | POA: Diagnosis not present

## 2016-07-27 DIAGNOSIS — D62 Acute posthemorrhagic anemia: Secondary | ICD-10-CM | POA: Diagnosis not present

## 2016-07-27 DIAGNOSIS — E785 Hyperlipidemia, unspecified: Secondary | ICD-10-CM | POA: Diagnosis present

## 2016-07-27 DIAGNOSIS — K219 Gastro-esophageal reflux disease without esophagitis: Secondary | ICD-10-CM | POA: Diagnosis present

## 2016-07-27 DIAGNOSIS — G2581 Restless legs syndrome: Secondary | ICD-10-CM | POA: Diagnosis present

## 2016-07-27 DIAGNOSIS — Z96612 Presence of left artificial shoulder joint: Secondary | ICD-10-CM | POA: Diagnosis not present

## 2016-07-27 DIAGNOSIS — Z7901 Long term (current) use of anticoagulants: Secondary | ICD-10-CM | POA: Diagnosis not present

## 2016-07-27 DIAGNOSIS — G8918 Other acute postprocedural pain: Secondary | ICD-10-CM | POA: Diagnosis not present

## 2016-07-27 DIAGNOSIS — Z791 Long term (current) use of non-steroidal anti-inflammatories (NSAID): Secondary | ICD-10-CM | POA: Diagnosis not present

## 2016-07-30 DIAGNOSIS — Z96612 Presence of left artificial shoulder joint: Secondary | ICD-10-CM | POA: Diagnosis not present

## 2016-07-30 DIAGNOSIS — M6281 Muscle weakness (generalized): Secondary | ICD-10-CM | POA: Diagnosis not present

## 2016-07-30 DIAGNOSIS — Z471 Aftercare following joint replacement surgery: Secondary | ICD-10-CM | POA: Diagnosis not present

## 2016-07-30 DIAGNOSIS — E119 Type 2 diabetes mellitus without complications: Secondary | ICD-10-CM | POA: Diagnosis not present

## 2016-07-30 DIAGNOSIS — I10 Essential (primary) hypertension: Secondary | ICD-10-CM | POA: Diagnosis not present

## 2016-07-30 DIAGNOSIS — M1711 Unilateral primary osteoarthritis, right knee: Secondary | ICD-10-CM | POA: Diagnosis not present

## 2016-07-31 ENCOUNTER — Ambulatory Visit: Payer: Medicare Other | Admitting: Family Medicine

## 2016-07-31 ENCOUNTER — Emergency Department (HOSPITAL_BASED_OUTPATIENT_CLINIC_OR_DEPARTMENT_OTHER): Payer: Medicare Other

## 2016-07-31 ENCOUNTER — Emergency Department (HOSPITAL_BASED_OUTPATIENT_CLINIC_OR_DEPARTMENT_OTHER)
Admission: EM | Admit: 2016-07-31 | Discharge: 2016-07-31 | Disposition: A | Discharge status: Home / Self Care | Payer: Medicare Other | Attending: Emergency Medicine | Admitting: Emergency Medicine

## 2016-07-31 ENCOUNTER — Encounter (HOSPITAL_BASED_OUTPATIENT_CLINIC_OR_DEPARTMENT_OTHER): Payer: Self-pay

## 2016-07-31 DIAGNOSIS — I5042 Chronic combined systolic (congestive) and diastolic (congestive) heart failure: Secondary | ICD-10-CM | POA: Insufficient documentation

## 2016-07-31 DIAGNOSIS — E1122 Type 2 diabetes mellitus with diabetic chronic kidney disease: Secondary | ICD-10-CM | POA: Insufficient documentation

## 2016-07-31 DIAGNOSIS — Z79899 Other long term (current) drug therapy: Secondary | ICD-10-CM | POA: Diagnosis not present

## 2016-07-31 DIAGNOSIS — I130 Hypertensive heart and chronic kidney disease with heart failure and stage 1 through stage 4 chronic kidney disease, or unspecified chronic kidney disease: Secondary | ICD-10-CM | POA: Diagnosis not present

## 2016-07-31 DIAGNOSIS — N182 Chronic kidney disease, stage 2 (mild): Secondary | ICD-10-CM | POA: Insufficient documentation

## 2016-07-31 DIAGNOSIS — M25561 Pain in right knee: Secondary | ICD-10-CM | POA: Diagnosis not present

## 2016-07-31 DIAGNOSIS — Z950 Presence of cardiac pacemaker: Secondary | ICD-10-CM | POA: Insufficient documentation

## 2016-07-31 DIAGNOSIS — Z7984 Long term (current) use of oral hypoglycemic drugs: Secondary | ICD-10-CM | POA: Diagnosis not present

## 2016-07-31 MED ORDER — OXYCODONE-ACETAMINOPHEN 5-325 MG PO TABS: 1.0000 | ORAL_TABLET | Freq: Once | ORAL | Status: DC

## 2016-07-31 MED ORDER — OXYCODONE-ACETAMINOPHEN 5-325 MG PO TABS
2.0000 | ORAL_TABLET | Freq: Once | ORAL | Status: AC
  Administered 2016-07-31: 2 via ORAL
  Filled 2016-07-31: qty 2

## 2016-07-31 NOTE — ED Notes (Signed)
Daughter of patient to desk very upset that pt has been waiting so long to see doctor. apologized for wait. Informed daughter I would talk to doctor about the situation. Dr. Winfred Leeds made aware.

## 2016-07-31 NOTE — ED Notes (Signed)
ED Provider at bedside. 

## 2016-07-31 NOTE — ED Triage Notes (Signed)
C/o right knee pain x 2 days-denies injury-presents to triage in w/c-NAD

## 2016-07-31 NOTE — ED Provider Notes (Signed)
[facility] DEPT [facility] Provider Note   CSN: [id number] Arrival date & time: 07/31/16  1856  By signing my name below, I, Mason [name], attest that this documentation has been prepared under the direction and in the presence of Mason Dakin, MD. Electronically Signed: Reola [name], ED Scribe. 07/31/16. 11:00 PM.  History   Chief Complaint Chief Complaint  Patient presents with  . Knee Pain   The history is provided by the patient and medical records. No language interpreter was used.    HPI Comments: Mason [name] is a 76 y.o. male who presents to the Emergency Department complaining of gradual onset, gradually worsening right knee pain beginning yesterday. He states that his pain is worse towards the posterior aspect of the knee. No recent trauma or injury to the knee to precipitate his pain. Pt is s/p left shoulder arthroplasty which was performed on 07/27/16 (4 days ago) by Dr [name] at [facility]. He was prescribed Oxycodone (last dose ~7 hours ago) to manage his pain at home from this. He has taken this today, and also utilized ice/heat without significant relief of his pain. His pain is additionally mildly alleviated while extended.Pain is worse with weightbearing and is nonradiating Pt notes that his pain is exacerbated with weight-bearing and ambulation. He is scheduled for f/u w/ his surgeon on 08/07/16. Pt is currently anticoagulated on Xarelto therapy daily for his h/o AFIB, which he has been taking for ~2 years. He denies chest pain, shortness of breath, or any other associated symptoms.   Past Medical History:  Diagnosis Date  . Arthritis    left shoulder (Dr. [name]): steroid injection 04/03/16.  Plan for TSA surgery 06/2016.  Mason [name] BPH (benign prostatic hypertrophy) 06/2011   Irritative sx's; pt declined trial of anticholinergic per Urology records  . Chronic combined systolic and diastolic heart failure (Mason [name]) 05/31/2012   EF 40-45%, LA mod-severe dilated,  AFIB.   02/2016 EF 40%, diffuse hypokinesis, grade 2 DD.  Myoc perf imaging showed EF 32% 04/2016.  Pt being considered for CRT-D as of 05/2016.  Mason [name] Chronic low back pain   . Chronic renal insufficiency, stage III (moderate) 2015   CrCl about 60 ml/min  . Depression   . Diabetes mellitus   . Episodic low back pain 01/22/2013   w/intermittent radiculitis (12/2014 his neurologist referred him to pain mgmt for epidural steroid injection)  . GERD (gastroesophageal reflux disease)   . H/O tilt table evaluation 11/02/05   negative  . Helicobacter pylori gastritis 01/2016  . History of adenomatous polyp of colon 10/12/11   Dr. Benson [name] (3 right side of colon- tubular adenomas removed)  . History of cardiovascular stress test 05/28/12   no ischemia, EF 37%, imaging results are unchanged and within normal variance  . History of chronic prostatitis   . History of retinal detachment    OS  . History of vertigo    + Hx of posterior HA's.  Neuro (Dr. Erling Cruz) eval 2011.  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  . Hypertension   . Lumbar spondylosis    lumbosacral radiculopathy at L4 by EMG testing, right foot drop (neurologist is Dr. Linus Salmons with [facility] in [place])--neurologist referred him to neurosurgery  . Nephrolithiasis 07/2012   Left UVJ 2 mm stone with dilation of renal collecting system and slight hydroureter on right  . 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  Since no CAD on cath 05/2016, cards recommends upgrade to CRT-D.  Mason [name] Permanent atrial fibrillation ([place])    DCCV 07/09/13-converted, lasted two days, then back into afib--needs lifetime anticoagulation (Xarelto as of 09/2014)  . Shortness of breath    Patient Active Problem List   Diagnosis Date Noted  . Chronic renal insufficiency, stage II (mild) 12/09/2015  . Uncontrolled hypertension  10/11/2015  . Permanent atrial fibrillation ([place]) 06/22/2015  . Diabetes mellitus with complication ([place]) 40/02/6760  . Cellulitis of finger of left hand 11/20/2014  . Swollen R ankle 05/25/2014  . Leg fatigue 02/15/2014  . Lumbar back pain with radiculopathy affecting left lower extremity 02/15/2014  . Other malaise and fatigue 02/05/2014  . Cervical muscle strain 11/27/2013  . Osteoarthritis of right knee 10/14/2013  . HTN (hypertension), benign 05/26/2013  . Acute low back pain with radicular symptoms, duration less than 6 weeks 05/12/2013  . Hyperlipidemia 04/21/2013  . DM2 (diabetes mellitus, type 2) ([place]) 04/21/2013  . Atrial fibrillation, permanent 03/08/2013  . Left ventricular dysfunction 05/31/2012  . Near syncope 02/06/2012  . AVB (atrioventricular block) 02/06/2012  . Cardiac pacemaker 02/05/12 02/06/2012  . Dyslipidemia 02/06/2012   Past Surgical History:  Procedure Laterality Date  . CARDIAC CATHETERIZATION N/A 06/14/2016   Minimal nonobstructive dz, EF 25-35%.  Procedure: Left Heart Cath and Coronary Angiography;  Surgeon: Mason M Martinique, MD;  Location: [facility];  Service: Cardiovascular;  Laterality: N/A;  . CARDIOVASCULAR STRESS TEST  2012   2012 nuclear perfusion study: low risk scan; 04/2016 normal myocardial perfusion imaging, EF 32%.  Mason [name] CARDIOVERSION  07/09/2012   Procedure: CARDIOVERSION;  Surgeon: Mason Klein, MD;  Location: [facility] ENDOSCOPY;  Service: Cardiovascular;  Laterality: N/A;  . COLONOSCOPY W/ POLYPECTOMY  approx 2006; repeated 09/2011   Polyps on 2013 EGD as well, repeat 12/2014  . ESOPHAGOGASTRODUODENOSCOPY  10/18/06   Done due to chronic GERD: Normal, bx showed no barrett's esophagus (Dr. Benson [name])  . EYE SURGERY     retinal detachment OS+ cataract surgery  . INSERT / REPLACE / REMOVE PACEMAKER  02/05/2012   dual chamber, sinus node dysfunction, sinus arrest, PAF, Medtronic Revo serial#[id number] H: last checked 05/2015  . LUMBAR LAMINECTOMY   1976   left L4-5  . PERMANENT PACEMAKER INSERTION N/A 02/05/2012   Procedure: PERMANENT PACEMAKER INSERTION;  Surgeon: Mason Klein, MD; Generator Medtronic Revo model [place] serial number [id number] H Laterality: N/A;  . SHOULDER SURGERY    . TRANSTHORACIC ECHOCARDIOGRAM  08/25/10; 05/2012; 03/23/16   mild asymmetric LVH, normal systolic function, normal diastolic fxn, mild-to-mod mitral regurg, mild aortic valve sclerosis and trace AI, mild aortic root dilatation. 2014 f/u showed EF 40-45%, mod LAE, A FIB.  02/2016 EF 40%, diffuse hypokinesis, grade 2 DD.       Home Medications    Prior to Admission medications   Medication Sig Start Date End Date Taking? Authorizing Provider  blood glucose meter kit and supplies KIT Use to check blood sugar 1 to 2 times daily 07/06/16   Tammi Sou, MD  colchicine 0.6 MG tablet Take 1 tablet (0.6 mg total) by mouth daily. Patient taking differently: Take 0.6 mg by mouth daily as needed (gout flare).  08/20/15   Malvin Johns, MD  colchicine 0.6 MG tablet Take 0.6 mg by mouth as needed. 08/20/15   Historical Provider, MD  COMBIGAN 0.2-0.5 % ophthalmic solution Place 1 drop into both eyes 2 (two) times daily.  04/19/16   Historical Provider, MD  gabapentin (NEURONTIN)  300 MG capsule TAKE 1 CAPSULE THREE TIMES A DAY 10/20/15   Tammi Sou, MD  GLIPIZIDE XL 5 MG 24 hr tablet TAKE 1 TABLET EVERY MORNING 03/28/16   Tammi Sou, MD  glucose blood test strip Use as instructed 11/12/14   Tammi Sou, MD  glucose blood test strip Use to check blood sugars 1-2 times daily 06/28/16   Tammi Sou, MD  ibuprofen (ADVIL,MOTRIN) 200 MG tablet Take 200 mg by mouth every 6 (six) hours as needed.    Historical Provider, MD  JANUVIA 100 MG tablet TAKE 1 TABLET DAILY 03/31/16   Tammi Sou, MD  Lancets (PRECISION ULTRA LANCET) MISC Use as instructed 11/12/14   Tammi Sou, MD  latanoprost (XALATAN) 0.005 % ophthalmic solution Place 1 drop into both eyes  at bedtime.  04/24/15   Historical Provider, MD  metFORMIN (GLUCOPHAGE) 1000 MG tablet Take 1,000 mg by mouth 2 (two) times daily with a meal.  03/28/16   Historical Provider, MD  metoprolol succinate (TOPROL-XL) 100 MG 24 hr tablet 1 and 1/2 tabs po qd 12/09/15   Tammi Sou, MD  nystatin cream (MYCOSTATIN) Apply 1 application topically 3 (three) times daily. 07/03/16   Tammi Sou, MD  omeprazole (PRILOSEC) 40 MG capsule Take 1 capsule (40 mg total) by mouth daily. Patient taking differently: Take 40 mg by mouth daily as needed (indigestion).  12/14/15   Tammi Sou, MD  ONE TOUCH LANCETS MISC Use to check blood sugar 1-2 times daily 06/28/16   Tammi Sou, MD  rOPINIRole (REQUIP) 1 MG tablet TAKE 1 TABLET DAILY (NEED OFFICE VISIT FOR MORE REFILLS) Patient taking differently: Take 2m at bedtime 05/31/16   PTammi Sou MD  sacubitril-valsartan (ENTRESTO) 97-103 MG Take 1 tablet by mouth 2 (two) times daily. 07/07/16   Mihai Croitoru, MD  sertraline (ZOLOFT) 100 MG tablet TAKE 1 TABLET DAILY 06/26/16   PTammi Sou MD  simvastatin (ZOCOR) 20 MG tablet TAKE 1 TABLET EVERY EVENING 03/28/16   PTammi Sou MD  XARELTO 20 MG TABS tablet TAKE 1 TABLET DAILY WITH SUPPER 03/14/16   MSanda Klein MD   Family History Family History  Problem Relation Age of Onset  . Heart failure Mother   . Stroke Mother   . Stroke Father    Social History Social History  Substance Use Topics  . Smoking status: Never Smoker  . Smokeless tobacco: Never Used  . Alcohol use 7.2 oz/week    12 Cans of beer per week     Comment: 12 beers per week   Allergies   Patient has no known allergies.  Review of Systems Review of Systems  Constitutional: Negative.   HENT: Negative.   Respiratory: Negative.  Negative for shortness of breath.   Cardiovascular: Negative.  Negative for chest pain.  Gastrointestinal: Negative.   Musculoskeletal: Positive for arthralgias (right knee) and gait  problem.  Skin: Negative.   Psychiatric/Behavioral: Negative.   All other systems reviewed and are negative.  Physical Exam Updated Vital Signs BP 115/68 (BP Location: Right Arm)   Pulse 70   Temp 100.9 F (38.3 C) (Oral)   Resp 18   Ht _0  (1.803 m)   Wt 225 lb (102.1 kg)   SpO2 98%   BMI 31.38 kg/m   Physical Exam  Constitutional: He appears well-developed and well-nourished.  HENT:  Head: Normocephalic and atraumatic.  Eyes: EOM are normal.  Neck: Neck supple.  No tracheal deviation present. No thyromegaly present.  Cardiovascular: Normal rate.   Pulmonary/Chest: Effort normal.  Abdominal: He exhibits no distension.  Musculoskeletal: He exhibits no edema or tenderness.  Left upper extremity in a long arm splint. Right lower extremity minimally swollen and tender at anterior knee. Not red or hot. DP pulses 2+. No tenderness posteriorly at knee and thigh or calf. DP pulse 2+ good capillary refill. All other extremities without redness swelling or tenderness neurovascularly intact  Neurological: He is alert. Coordination normal.  Skin: Skin is warm and dry. No rash noted.  Psychiatric: He has a normal mood and affect.  Nursing note and vitals reviewed.  ED Treatments / Results  DIAGNOSTIC STUDIES: Oxygen Saturation is 100% on RA, normal by my interpretation.   COORDINATION OF CARE: 11:00 PM-Discussed next steps with pt. Pt verbalized understanding and is agreeable with the plan.   Labs (all labs ordered are listed, but only abnormal results are displayed) Labs Reviewed - No data to display  EKG  EKG Interpretation None     X-ray viewed by me Radiology US Venous Img Lower Unilateral Right  Result Date: 07/31/2016 CLINICAL DATA:  Right knee pain for 2 days. No injury. Xarelto for 1 year. Cardiac pacemaker. Recent shoulder surgery. EXAM: Right LOWER EXTREMITY VENOUS DOPPLER ULTRASOUND TECHNIQUE: Gray-scale sonography with graded compression, as well as color  Doppler and duplex ultrasound were performed to evaluate the lower extremity deep venous systems from the level of the common femoral vein and including the common femoral, femoral, profunda femoral, popliteal and calf veins including the posterior tibial, peroneal and gastrocnemius veins when visible. The superficial great saphenous vein was also interrogated. Spectral Doppler was utilized to evaluate flow at rest and with distal augmentation maneuvers in the common femoral, femoral and popliteal veins. COMPARISON:  None. FINDINGS: Contralateral Common Femoral Vein: Respiratory phasicity is normal and symmetric with the symptomatic side. No evidence of thrombus. Normal compressibility. Common Femoral Vein: No evidence of thrombus. Normal compressibility, respiratory phasicity and response to augmentation. Saphenofemoral Junction: No evidence of thrombus. Normal compressibility and flow on color Doppler imaging. Profunda Femoral Vein: No evidence of thrombus. Normal compressibility and flow on color Doppler imaging. Femoral Vein: No evidence of thrombus. Normal compressibility, respiratory phasicity and response to augmentation. Popliteal Vein: No evidence of thrombus. Normal compressibility, respiratory phasicity and response to augmentation. Calf Veins: No evidence of thrombus. Normal compressibility and flow on color Doppler imaging. Superficial Great Saphenous Vein: No evidence of thrombus. Normal compressibility and flow on color Doppler imaging. Venous Reflux:  None. Other Findings:  None. IMPRESSION: No evidence of deep venous thrombosis. Electronically Signed   By: Lucienne Capers M.D.   On: 07/31/2016 22:07   Dg Knee Complete 4 Views Right  Result Date: 07/31/2016 CLINICAL DATA:  Acute onset of right knee pain. Unable to bear weight on right leg. Initial encounter. EXAM: RIGHT KNEE - COMPLETE 4+ VIEW COMPARISON:  Right knee radiographs performed 04/26/2016 FINDINGS: There is no evidence of fracture or  dislocation. Mild posterior osteophyte formation is noted at the proximal tibia. The joint spaces are preserved. The patellofemoral joint is grossly unremarkable in appearance. A fabella is noted. A small knee joint effusion is noted. The visualized soft tissues are otherwise unremarkable in appearance. IMPRESSION: 1. No evidence of fracture or dislocation. 2. Small knee joint effusion noted. Electronically Signed   By: Garald Balding M.D.   On: 07/31/2016 19:37   Results for orders placed or performed in visit on  07/03/16  Hemoglobin A1c  Result Value Ref Range   Hgb A1c MFr Bld 7.8 (H) 4.6 - 6.5 %  Lipid panel  Result Value Ref Range   Cholesterol 148 0 - 200 mg/dL   Triglycerides 172.0 (H) 0.0 - 149.0 mg/dL   HDL 41.40 >39.00 mg/dL   VLDL 34.4 0.0 - 40.0 mg/dL   LDL Cholesterol 72 0 - 99 mg/dL   Total CHOL/HDL Ratio 4    NonHDL 106.72   Zinc  Result Value Ref Range   Zinc 66 60 - 130 mcg/dL   US Venous Img Lower Unilateral Right  Result Date: 07/31/2016 CLINICAL DATA:  Right knee pain for 2 days. No injury. Xarelto for 1 year. Cardiac pacemaker. Recent shoulder surgery. EXAM: Right LOWER EXTREMITY VENOUS DOPPLER ULTRASOUND TECHNIQUE: Gray-scale sonography with graded compression, as well as color Doppler and duplex ultrasound were performed to evaluate the lower extremity deep venous systems from the level of the common femoral vein and including the common femoral, femoral, profunda femoral, popliteal and calf veins including the posterior tibial, peroneal and gastrocnemius veins when visible. The superficial great saphenous vein was also interrogated. Spectral Doppler was utilized to evaluate flow at rest and with distal augmentation maneuvers in the common femoral, femoral and popliteal veins. COMPARISON:  None. FINDINGS: Contralateral Common Femoral Vein: Respiratory phasicity is normal and symmetric with the symptomatic side. No evidence of thrombus. Normal compressibility. Common Femoral  Vein: No evidence of thrombus. Normal compressibility, respiratory phasicity and response to augmentation. Saphenofemoral Junction: No evidence of thrombus. Normal compressibility and flow on color Doppler imaging. Profunda Femoral Vein: No evidence of thrombus. Normal compressibility and flow on color Doppler imaging. Femoral Vein: No evidence of thrombus. Normal compressibility, respiratory phasicity and response to augmentation. Popliteal Vein: No evidence of thrombus. Normal compressibility, respiratory phasicity and response to augmentation. Calf Veins: No evidence of thrombus. Normal compressibility and flow on color Doppler imaging. Superficial Great Saphenous Vein: No evidence of thrombus. Normal compressibility and flow on color Doppler imaging. Venous Reflux:  None. Other Findings:  None. IMPRESSION: No evidence of deep venous thrombosis. Electronically Signed   By: Lucienne Capers M.D.   On: 07/31/2016 22:07   Dg Knee Complete 4 Views Right  Result Date: 07/31/2016 CLINICAL DATA:  Acute onset of right knee pain. Unable to bear weight on right leg. Initial encounter. EXAM: RIGHT KNEE - COMPLETE 4+ VIEW COMPARISON:  Right knee radiographs performed 04/26/2016 FINDINGS: There is no evidence of fracture or dislocation. Mild posterior osteophyte formation is noted at the proximal tibia. The joint spaces are preserved. The patellofemoral joint is grossly unremarkable in appearance. A fabella is noted. A small knee joint effusion is noted. The visualized soft tissues are otherwise unremarkable in appearance. IMPRESSION: 1. No evidence of fracture or dislocation. 2. Small knee joint effusion noted. Electronically Signed   By: Garald Balding M.D.   On: 07/31/2016 19:37   Procedures Procedures   Medications Ordered in ED Medications - No data to display  Initial Impression / Assessment and Plan / ED Course  I have reviewed the triage vital signs and the nursing notes.  Pertinent labs & imaging  results that were available during my care of the patient were reviewed by me and considered in my medical decision making (see chart for details).  Clinical Course    I strongly doubt infection. Knee is not red or warm. Effusion is small. I don't believe that arthrocentesis and is in order as patient is on  blood thinners. There is no evidence of DVT Plan: He can increase oxycodone to 2 tablets 4 times daily. Follow-up with his orthopedist Dr. [name] if not better by next week. Rest elevation.  Final Clinical Impressions(s) / ED Diagnoses  Diagnosis right knee pain with effusion Final diagnoses:  None   New Prescriptions New Prescriptions   No medications on file   I personally performed the services described in this documentation, which was scribed in my presence. The recorded information has been reviewed and considered.    Mason Dakin, MD 07/31/16 2329

## 2016-07-31 NOTE — Discharge Instructions (Signed)
You may take 1 or 2 of your oxycodone tablets 4 times daily for pain. Stay off  leg as much as possible. You can also holding an ice pack over your knee 4 times daily for 30 minutes at a time which will help with pain. Keep your scheduled appointment next week with Dr. [name] next week. Take Senokot with oxycodone as the medicine can cause constipation.

## 2016-08-02 ENCOUNTER — Ambulatory Visit: Payer: Medicare Other

## 2016-08-02 DIAGNOSIS — M1711 Unilateral primary osteoarthritis, right knee: Secondary | ICD-10-CM | POA: Diagnosis not present

## 2016-08-02 DIAGNOSIS — E119 Type 2 diabetes mellitus without complications: Secondary | ICD-10-CM | POA: Diagnosis not present

## 2016-08-02 DIAGNOSIS — M6281 Muscle weakness (generalized): Secondary | ICD-10-CM | POA: Diagnosis not present

## 2016-08-02 DIAGNOSIS — I10 Essential (primary) hypertension: Secondary | ICD-10-CM | POA: Diagnosis not present

## 2016-08-02 DIAGNOSIS — Z471 Aftercare following joint replacement surgery: Secondary | ICD-10-CM | POA: Diagnosis not present

## 2016-08-02 DIAGNOSIS — Z96612 Presence of left artificial shoulder joint: Secondary | ICD-10-CM | POA: Diagnosis not present

## 2016-08-03 DIAGNOSIS — E119 Type 2 diabetes mellitus without complications: Secondary | ICD-10-CM | POA: Diagnosis not present

## 2016-08-03 DIAGNOSIS — Z471 Aftercare following joint replacement surgery: Secondary | ICD-10-CM | POA: Diagnosis not present

## 2016-08-03 DIAGNOSIS — M6281 Muscle weakness (generalized): Secondary | ICD-10-CM | POA: Diagnosis not present

## 2016-08-03 DIAGNOSIS — M1711 Unilateral primary osteoarthritis, right knee: Secondary | ICD-10-CM | POA: Diagnosis not present

## 2016-08-03 DIAGNOSIS — M19012 Primary osteoarthritis, left shoulder: Secondary | ICD-10-CM | POA: Diagnosis not present

## 2016-08-03 DIAGNOSIS — Z96612 Presence of left artificial shoulder joint: Secondary | ICD-10-CM | POA: Diagnosis not present

## 2016-08-03 DIAGNOSIS — I10 Essential (primary) hypertension: Secondary | ICD-10-CM | POA: Diagnosis not present

## 2016-08-04 ENCOUNTER — Encounter: Payer: Self-pay | Admitting: Cardiovascular Disease

## 2016-08-04 ENCOUNTER — Ambulatory Visit (INDEPENDENT_AMBULATORY_CARE_PROVIDER_SITE_OTHER): Payer: Medicare Other | Admitting: Cardiovascular Disease

## 2016-08-04 ENCOUNTER — Ambulatory Visit (INDEPENDENT_AMBULATORY_CARE_PROVIDER_SITE_OTHER): Payer: Medicare Other | Admitting: Pharmacist

## 2016-08-04 VITALS — BP 111/71 | HR 78 | Ht 71.0 in | Wt 231.0 lb

## 2016-08-04 DIAGNOSIS — I4821 Permanent atrial fibrillation: Secondary | ICD-10-CM

## 2016-08-04 DIAGNOSIS — I5022 Chronic systolic (congestive) heart failure: Secondary | ICD-10-CM | POA: Diagnosis not present

## 2016-08-04 DIAGNOSIS — E118 Type 2 diabetes mellitus with unspecified complications: Secondary | ICD-10-CM

## 2016-08-04 DIAGNOSIS — I4430 Unspecified atrioventricular block: Secondary | ICD-10-CM | POA: Diagnosis not present

## 2016-08-04 DIAGNOSIS — I482 Chronic atrial fibrillation: Secondary | ICD-10-CM

## 2016-08-04 DIAGNOSIS — I10 Essential (primary) hypertension: Secondary | ICD-10-CM

## 2016-08-04 DIAGNOSIS — Z950 Presence of cardiac pacemaker: Secondary | ICD-10-CM

## 2016-08-04 NOTE — Patient Instructions (Signed)
You have been referred to Dr Allegra Lai for a BiV upgrade.  Dr Sallyanne Kuster recommends that you schedule a follow-up appointment in 6 months. You will receive a reminder letter in the mail two months in advance. If you don't receive a letter, please call our office to schedule the follow-up appointment.  If you need a refill on your cardiac medications before your next appointment, please call your pharmacy.

## 2016-08-04 NOTE — Progress Notes (Signed)
Patient ID: Mason [name]                 DOB: 04-10-1941                      MRN: [id number]     HPI: Mason [name] is a 76 yo male with PMI relevant for Afib, HF with reduced EF = 32% , CKD, DM and hypertension. Presents to pharmacist clinic today for follow up of Entresto titration.   Patient converted from losartan 100 mg to Entresto 49/51 on Nov/29/2017. Entresto dose was increased to 97/103 mg on 07/04/16. Renal function remains appropriate with estimated CrCl =58 ml/min (cockcroft-Gault; IBW) with most recent BMET on 07/29/16 (see pre op note).   Patient had f/u appointment with cardiologist Dr Mason [name], then short interview conducted after appoinment  Current HTN meds: Entresto  97/103mg  BID, metoprolol succinate 10 mg daily  Previously tried: Losartan 100mg    BP goal: 130/80  Social History: negative for cigarettes or smokeless tobacco;  beer 2-3x per week ; no illicit drugs  Family History:The patient's family history includes Heart failure in his mother; Stroke in his father and mother.  Diet: no salt    Wt Readings from Last 3 Encounters:  07/31/16 225 lb (102.1 kg)  07/03/16 229 lb 8 oz (104.1 kg)  06/26/16 225 lb (102.1 kg)   BP Readings from Last 3 Encounters:  07/31/16 115/68  07/04/16 (!) 134/92  07/03/16 127/86   Pulse Readings from Last 3 Encounters:  07/31/16 70  07/04/16 74  07/03/16 79     Past Medical History:  Diagnosis Date  . Arthritis    left shoulder (Dr. [name]): steroid injection 04/03/16.  Plan for TSA surgery 06/2016.  Mason [name] BPH (benign prostatic hypertrophy) 06/2011   Irritative sx's; pt declined trial of anticholinergic per Urology records  . Chronic combined systolic and diastolic heart failure ([place]) 05/31/2012   EF 40-45%, LA mod-severe dilated, AFIB.   02/2016 EF 40%, diffuse hypokinesis, grade 2 DD.  Myoc perf imaging showed EF 32% 04/2016.  Pt being considered for CRT-D as of 05/2016.  Mason [name] Chronic low back pain   . Chronic  renal insufficiency, stage III (moderate) 2015   CrCl about 60 ml/min  . Depression   . Diabetes mellitus   . Episodic low back pain 01/22/2013   w/intermittent radiculitis (12/2014 his neurologist referred him to pain mgmt for epidural steroid injection)  . GERD (gastroesophageal reflux disease)   . H/O tilt table evaluation 11/02/05   negative  . Helicobacter pylori gastritis 01/2016  . History of adenomatous polyp of colon 10/12/11   Dr. [name] (3 right side of colon- tubular adenomas removed)  . History of cardiovascular stress test 05/28/12   no ischemia, EF 37%, imaging results are unchanged and within normal variance  . History of chronic prostatitis   . History of retinal detachment    OS  . History of vertigo    + Hx of posterior HA's.  Neuro (Dr. Erling Cruz) eval 2011.  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  . Hypertension   . Lumbar spondylosis    lumbosacral radiculopathy at L4 by EMG testing, right foot drop (neurologist is Dr. Linus Salmons with [facility] in [place])--neurologist referred him to neurosurgery  . Nephrolithiasis 07/2012   Left UVJ 2 mm stone with dilation of renal collecting system and slight hydroureter on right  . 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  Since no CAD on cath 05/2016, cards recommends upgrade to CRT-D.  Mason [name] Permanent atrial fibrillation ([place])    DCCV 07/09/13-converted, lasted two days, then back into afib--needs lifetime anticoagulation (Xarelto as of 09/2014)  . Shortness of breath      No Known Allergies  Assessment/Plan:  1. Entresto titration - Pateint taking Entresto 97/103 ; patient saw DR C today this appt was cancelled

## 2016-08-04 NOTE — Progress Notes (Signed)
Cardiology Office Note    Date:  08/04/2016   ID:  Mason [name], DOB Sep 30, 1940, MRN [id number]  PCP:  Mason Sou, MD  Cardiologist:   Mason Klein, MD   Chief Complaint  Patient presents with  . Follow-up  . Shortness of Breath    randomly; has improved.  . Dizziness    randomly, when getting up and down.  . Edema    in right knee and foot.    History of Present Illness:  Mason [name] is a 76 y.o. 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 After starting a low dose of loop diuretic his dyspnea has almost resolved. In August his echocardiogram showed left ventricular ejection fraction that had dropped to 40%, with diffuse hypokinesis. In October his nuclear stress test showed a normal pattern of myocardial perfusion, but the EF was down to 32%. Her catheterization showed minor coronary atherosclerosis and normal LVEDP.  I recommended that he undergo upgrade to a biventricular pacemaker-ICD and he is here today with his wife to ask several questions about this.  His dual-chamber Medtronic Revo pacemaker was implanted in July 2013 but is now programmed VVIR for permanent atrial fibrillation. He has 100% atrial fibrillation and 100% ventricular pacing. Despite the abnormalities described above he is extremely active (6.4 hours/day as measured by his pacemaker). Battery voltage is 2.93 V/RRT 2.81 V) He is on chronic anticoagulation with Xarelto and denies either embolic or bleeding problems.  He does not have coronary or other vascular disorders, but has numerous risk factors including type 2 diabetes, hyperlipidemia, mild obesity. He has a history of gout.  He had left shoulder replacement surgery in December 2017 (Mason [name]). His pacemaker is located in the left subclavian region and was programmed VOO  temporarily. No problems happened. Wearing a sling and is going to have rehabilitation.    Past Medical History:  Diagnosis Date  . Arthritis    left shoulder (Dr. Sheral [name]): steroid injection 04/03/16.  Plan for TSA surgery 06/2016.  Mason [name] BPH (benign prostatic hypertrophy) 06/2011   Irritative sx's; pt declined trial of anticholinergic per Urology records  . Chronic combined systolic and diastolic heart failure (Mason [name]) 05/31/2012   EF 40-45%, LA mod-severe dilated, AFIB.   02/2016 EF 40%, diffuse hypokinesis, grade 2 DD.  Myoc perf imaging showed EF 32% 04/2016.  Pt being considered for CRT-D as of 05/2016.  Mason [name] Chronic low back pain   . Chronic renal insufficiency, stage III (moderate) 2015   CrCl about 60 ml/min  . Depression   . Diabetes mellitus   . Episodic low back pain 01/22/2013   w/intermittent radiculitis (12/2014 his neurologist referred him to pain mgmt for epidural steroid injection)  . GERD (gastroesophageal reflux disease)   . H/O tilt table evaluation 11/02/05   negative  . Helicobacter pylori gastritis 01/2016  . History of adenomatous polyp of colon 10/12/11   Mason [name] (3 right side of colon- tubular adenomas removed)  . History of cardiovascular stress test 05/28/12   no ischemia, EF 37%, imaging results are unchanged and within normal variance  . History of chronic prostatitis   . History of retinal detachment    OS  . History of vertigo    + Hx of posterior HA's.  Neuro (Mason [name]) eval 2011.  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  . Hypertension   . Lumbar spondylosis    lumbosacral radiculopathy at L4 by EMG testing, right foot drop (neurologist is Dr. Linus [name] with [facility] in [place])--neurologist referred him to neurosurgery  . Nephrolithiasis 07/2012   Left UVJ 2 mm stone with dilation of renal collecting system and slight hydroureter on right  . Pacemaker 02/05/2012    dual chamber, complete heart block, meddtronic revo, lasted checked 12/2015.  Since no CAD on cath 05/2016, cards recommends upgrade to CRT-D.  Mason [name] Permanent atrial fibrillation ([place])    DCCV 07/09/13-converted, lasted two days, then back into afib--needs lifetime anticoagulation (Xarelto as of 09/2014)  . Shortness of breath     Past Surgical History:  Procedure Laterality Date  . CARDIAC CATHETERIZATION N/A 06/14/2016   Minimal nonobstructive dz, EF 25-35%.  Procedure: Left Heart Cath and Coronary Angiography;  Surgeon: Mason M Martinique, MD;  Location: [facility];  Service: Cardiovascular;  Laterality: N/A;  . CARDIOVASCULAR STRESS TEST  2012   2012 nuclear perfusion study: low risk scan; 04/2016 normal myocardial perfusion imaging, EF 32%.  Mason [name] CARDIOVERSION  07/09/2012   Procedure: CARDIOVERSION;  Surgeon: Mason Klein, MD;  Location: [facility];  Service: Cardiovascular;  Laterality: N/A;  . COLONOSCOPY W/ POLYPECTOMY  approx 2006; repeated 09/2011   Polyps on 2013 EGD as well, repeat 12/2014  . ESOPHAGOGASTRODUODENOSCOPY  10/18/06   Done due to chronic GERD: Normal, bx showed no barrett's esophagus (Mason [name])  . EYE SURGERY     retinal detachment OS+ cataract surgery  . INSERT / REPLACE / REMOVE PACEMAKER  02/05/2012   dual chamber, sinus node dysfunction, sinus arrest, PAF, Medtronic Revo serial#-[id number] H: last checked 05/2015  . LUMBAR LAMINECTOMY  1976   left L4-5  . PERMANENT PACEMAKER INSERTION N/A 02/05/2012   Procedure: PERMANENT PACEMAKER INSERTION;  Surgeon: Mason Klein, MD; Generator Medtronic Revo model [place] serial number [id number] H Laterality: N/A;  . SHOULDER SURGERY    . TRANSTHORACIC ECHOCARDIOGRAM  08/25/10; 05/2012; 03/23/16   mild asymmetric LVH, normal systolic function, normal diastolic fxn, mild-to-mod mitral regurg, mild aortic valve sclerosis and trace AI, mild aortic root dilatation. 2014 f/u showed EF 40-45%, mod LAE, A FIB.  02/2016 EF 40%, diffuse  hypokinesis, grade 2 DD.    Current Medications: Outpatient Medications Prior to Visit  Medication Sig Dispense Refill  . blood glucose meter kit and supplies KIT Use to check blood sugar 1 to 2 times daily 1 each 0  . colchicine 0.6 MG tablet Take 1 tablet (0.6 mg total) by mouth daily. (Patient taking differently: Take 0.6 mg by mouth daily as needed (gout flare). ) 15 tablet 0  . colchicine 0.6 MG tablet Take 0.6 mg by mouth as needed.    . COMBIGAN 0.2-0.5 % ophthalmic solution Place 1 drop into both eyes 2 (two) times daily.     Mason [name] gabapentin (NEURONTIN) 300 MG capsule TAKE 1 CAPSULE THREE TIMES A DAY 270 capsule 3  . GLIPIZIDE XL 5 MG 24 hr tablet TAKE 1 TABLET EVERY MORNING 90 tablet 1  . glucose blood test strip Use as instructed 100 each 3  . glucose blood test strip Use to check blood sugars 1-2 times daily 100 each 1  . JANUVIA 100 MG tablet TAKE 1 TABLET DAILY 90 tablet 1  . Lancets (PRECISION ULTRA LANCET) MISC Use as instructed 100 each 3  . latanoprost (XALATAN) 0.005 % ophthalmic solution Place 1 drop into both eyes at bedtime.  12  . metFORMIN (GLUCOPHAGE) 1000 MG tablet Take 1,000 mg by mouth 2 (two) times daily with a meal.     . metoprolol succinate (TOPROL-XL) 100 MG 24 hr tablet 1 and 1/2 tabs po qd 135 tablet 3  . nystatin cream (MYCOSTATIN) Apply 1 application topically 3 (three) times daily. 15 g 1  . omeprazole (PRILOSEC) 40 MG capsule Take 1 capsule (40 mg total) by mouth daily. (Patient taking differently: Take 40 mg by mouth daily as needed (indigestion). ) 90 capsule 3  . ONE TOUCH LANCETS MISC Use to check blood sugar 1-2 times daily 200 each 0  . rOPINIRole (REQUIP) 1 MG tablet TAKE 1 TABLET DAILY (NEED OFFICE VISIT FOR MORE REFILLS) (Patient taking differently: Take 49m at bedtime) 90 tablet 1  . sacubitril-valsartan (ENTRESTO) 97-103 MG Take 1 tablet by mouth 2 (two) times daily. 60 tablet 1  . sertraline (ZOLOFT) 100 MG tablet TAKE 1 TABLET DAILY 90 tablet 1   . simvastatin (ZOCOR) 20 MG tablet TAKE 1 TABLET EVERY EVENING 90 tablet 1  . XARELTO 20 MG TABS tablet TAKE 1 TABLET DAILY WITH SUPPER 90 tablet 1  . ibuprofen (ADVIL,MOTRIN) 200 MG tablet Take 200 mg by mouth every 6 (six) hours as needed.     No facility-administered medications prior to visit.      Allergies:   Patient has no known allergies.   Social History   Social History  . Marital status: Married    Spouse name: N/A  . Number of children: N/A  . Years of education: N/A   Social History Main Topics  . Smoking status: Never Smoker  . Smokeless tobacco: Never Used  . Alcohol use 7.2 oz/week    12 Cans of beer per week     Comment: 12 beers per week  . Drug use: No  . Sexual activity: Not Asked   Other Topics Concern  . None   Social History Narrative  . None     Family History:  The patient's family history includes Heart failure in his mother; Stroke in his father and mother.   ROS:   Please see the history of present illness.    ROS All other systems reviewed and are negative.   PHYSICAL EXAM:   VS:  BP 111/71   Pulse 78   Ht _0  (1.803 m)   Wt 104.8 kg (231 lb)   BMI 32.22 kg/m    GEN: Well nourished, well developed, in no acute distress  HEENT: normal  Neck: no JVD, carotid bruits, or masses Cardiac: RRR, paradoxically split S2; no murmurs, rubs, or gallops,no edema  Respiratory:  clear to auscultation bilaterally, normal work of breathing GI: soft, nontender, nondistended, + BS MS: no deformity or atrophy  Skin: warm and dry, no rash Neuro:  Alert and Oriented x 3, Strength and sensation are intact Psych: euthymic mood, full affect  Wt Readings from Last 3 Encounters:  08/04/16 104.8 kg (231 lb)  07/31/16 102.1 kg (225 lb)  07/03/16 104.1 kg (229 lb 8 oz)      Studies/Labs Reviewed:   EKG:  EKG is not ordered today.    Recent Labs: 02/21/2016: ALT 17 06/06/2016: BUN 16; Creat 1.21; Hemoglobin 14.3; Platelets 177; Potassium 4.4;  Sodium 137; TSH 3.37   Lipid Panel    Component Value Date/Time   CHOL 148 07/03/2016 0832   TRIG 172.0 (H) 07/03/2016 0832   HDL 41.40 07/03/2016 0832   CHOLHDL 4 07/03/2016 08891  VLDL 34.4 07/03/2016 0832   LDLCALC 72 07/03/2016 0832    Additional studies/ records that were reviewed today include:  Echo and nuclear images    ASSESSMENT:    1. Chronic systolic heart failure ([place])   2. AVB (atrioventricular block)   3. Permanent atrial fibrillation ([facility])   4. Cardiac pacemaker 02/05/12   5. Diabetes mellitus with complication ([place])   6. HTN (hypertension), benign      PLAN:  In order of problems listed above:  1. CHF: Appears euvolemic, NYHA functional class I. He is on relatively high doses of beta blocker and Entresto. Normal LVEDP at the time of cardiac catheterization.   2. 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 I think it is safest if we consider him to be pacemaker dependent. 3. AFib: Permanent. Should not interfere with cardiac resynchronization since he has progressed to complete heart block. He is compliant with anticoagulation. 4. PPM: Normal device function. Option for upgrade to BiV device, probably CRT-D. Although he is 76 years old, except for his cardiac problems, [name] is otherwise very healthy and fit. I think a defibrillator is appropriate. Referred to Dr. Curt Bears. He needs to finish his shoulder rehabilitation before the device is implanted to minimize the risk of lead dislodgment. 5. Obesity/metabolic syndrome/diabetes mellitus/low HDL: LDL is in desirable range and he has good glycemic control. He is very physically active. Kidneys to focus on losing weight by mouth caloric/carbohydrate restriction. 6. HTN: good control  Don and his wife asked several questions about the technique for implanting a CRT-D device, pros and cons, possible complications. We reviewed all these in detail. He agrees to see an  elective physiologist to discuss device upgrade.  Medication Adjustments/Labs and Tests Ordered: Current medicines are reviewed at length with the patient today.  Concerns regarding medicines are outlined above.  Medication changes, Labs and Tests ordered today are listed in the Patient Instructions below. Patient Instructions  You have been referred to Dr Allegra Lai for a BiV upgrade.  Dr Sallyanne Kuster recommends that you schedule a follow-up appointment in 6 months. You will receive a reminder letter in the mail two months in advance. If you don't receive a letter, please call our office to schedule the follow-up appointment.  If you need a refill on your cardiac medications before your next appointment, please call your pharmacy.    Signed, Mason Klein, MD  08/04/2016 7:11 PM    [facility] [address], [place], [place]  [address] Phone: [phone]; Fax: [phone]

## 2016-08-06 ENCOUNTER — Encounter: Payer: Self-pay | Admitting: Family Medicine

## 2016-08-08 DIAGNOSIS — Z471 Aftercare following joint replacement surgery: Secondary | ICD-10-CM | POA: Diagnosis not present

## 2016-08-08 DIAGNOSIS — Z96612 Presence of left artificial shoulder joint: Secondary | ICD-10-CM | POA: Diagnosis not present

## 2016-08-08 DIAGNOSIS — M6281 Muscle weakness (generalized): Secondary | ICD-10-CM | POA: Diagnosis not present

## 2016-08-08 DIAGNOSIS — M1711 Unilateral primary osteoarthritis, right knee: Secondary | ICD-10-CM | POA: Diagnosis not present

## 2016-08-08 DIAGNOSIS — E119 Type 2 diabetes mellitus without complications: Secondary | ICD-10-CM | POA: Diagnosis not present

## 2016-08-08 DIAGNOSIS — I10 Essential (primary) hypertension: Secondary | ICD-10-CM | POA: Diagnosis not present

## 2016-08-11 ENCOUNTER — Telehealth: Payer: Self-pay | Admitting: Cardiovascular Disease

## 2016-08-11 DIAGNOSIS — M1711 Unilateral primary osteoarthritis, right knee: Secondary | ICD-10-CM | POA: Diagnosis not present

## 2016-08-11 DIAGNOSIS — E119 Type 2 diabetes mellitus without complications: Secondary | ICD-10-CM | POA: Diagnosis not present

## 2016-08-11 DIAGNOSIS — Z96612 Presence of left artificial shoulder joint: Secondary | ICD-10-CM | POA: Diagnosis not present

## 2016-08-11 DIAGNOSIS — I10 Essential (primary) hypertension: Secondary | ICD-10-CM | POA: Diagnosis not present

## 2016-08-11 DIAGNOSIS — Z471 Aftercare following joint replacement surgery: Secondary | ICD-10-CM | POA: Diagnosis not present

## 2016-08-11 DIAGNOSIS — M6281 Muscle weakness (generalized): Secondary | ICD-10-CM | POA: Diagnosis not present

## 2016-08-11 NOTE — Telephone Encounter (Signed)
Mr. Blayney also wants to know that if he does not get any of the Entresto ,  Can he take the losartan . Please call

## 2016-08-11 NOTE — Telephone Encounter (Signed)
°*  STAT* If patient is at the pharmacy, call can be transferred to refill team.   1. Which medications need to be refilled? (please list name of each medication and dose if known) Entresto 103 mg   2. Which pharmacy/location (including street and city if local pharmacy) is medication to be sent to?[facility]   3. Do they need a 30 day or 90 day supply? [place]

## 2016-08-11 NOTE — Telephone Encounter (Signed)
Patient notified to stop entresto 97-103mg  BID and take losartan 100mg  in its place. Med list updated.

## 2016-08-11 NOTE — Telephone Encounter (Signed)
Addressed in another telephone encounter.

## 2016-08-11 NOTE — Telephone Encounter (Signed)
Go ahead and switch back to losartan 100 mg daily.

## 2016-08-11 NOTE — Telephone Encounter (Signed)
Returned call to patient Patient states he has enough entresto to last til tomorrow He states a month supply of this medication is $500 - he cannot afford  Patient previously taking losartan 100mg  - he has some of this medication Rx'ed entresto 97-103 BID 07/04/2016 by K. Alvstad  He states if a new Rx is to be called in, he uses [employer] routed to K. Alvstad, Dr. Sallyanne Kuster, [facility] CMA

## 2016-08-11 NOTE — Telephone Encounter (Signed)
Mr. Lapietra is wanting to know if he doe not get any  Entresto , should he take the losartan . Please call    Thanks

## 2016-08-14 NOTE — Telephone Encounter (Signed)
Does he know if the $500 copay is going to be all year or is it just that he has to meet the deductible since this is a new year? I think that there is some form of assistance that the manufacturer provides, even with Medicare, but I may be wrong. Kristin or Raquel, ideas? MCr

## 2016-08-14 NOTE — Telephone Encounter (Signed)
My guess is that about $300-400 of that is deductible, problem is he is also on Januvia and several eye drops, so he will most likely fall into the donut hole by fall, and then the price will go up again.  Entresto assistance for Medicare consists of giving the patient [employer] foundation phone #.

## 2016-08-14 NOTE — Telephone Encounter (Signed)
Chelley, can you please tell him that most of the high cost for this month is the deductible, because it's a new year. Have him check with his pharmacy. I do not think it will be $500 every month. Delene Loll is a better drug than losartan, but is indeed much more expensive. Also give him the number for the PAN foundation, please.

## 2016-08-15 DIAGNOSIS — Z96612 Presence of left artificial shoulder joint: Secondary | ICD-10-CM | POA: Diagnosis not present

## 2016-08-15 DIAGNOSIS — E119 Type 2 diabetes mellitus without complications: Secondary | ICD-10-CM | POA: Diagnosis not present

## 2016-08-15 DIAGNOSIS — M1711 Unilateral primary osteoarthritis, right knee: Secondary | ICD-10-CM | POA: Diagnosis not present

## 2016-08-15 DIAGNOSIS — I10 Essential (primary) hypertension: Secondary | ICD-10-CM | POA: Diagnosis not present

## 2016-08-15 DIAGNOSIS — M6281 Muscle weakness (generalized): Secondary | ICD-10-CM | POA: Diagnosis not present

## 2016-08-15 DIAGNOSIS — Z471 Aftercare following joint replacement surgery: Secondary | ICD-10-CM | POA: Diagnosis not present

## 2016-08-17 DIAGNOSIS — M6281 Muscle weakness (generalized): Secondary | ICD-10-CM | POA: Diagnosis not present

## 2016-08-17 DIAGNOSIS — M1711 Unilateral primary osteoarthritis, right knee: Secondary | ICD-10-CM | POA: Diagnosis not present

## 2016-08-17 DIAGNOSIS — Z471 Aftercare following joint replacement surgery: Secondary | ICD-10-CM | POA: Diagnosis not present

## 2016-08-17 DIAGNOSIS — E119 Type 2 diabetes mellitus without complications: Secondary | ICD-10-CM | POA: Diagnosis not present

## 2016-08-17 DIAGNOSIS — I10 Essential (primary) hypertension: Secondary | ICD-10-CM | POA: Diagnosis not present

## 2016-08-17 DIAGNOSIS — Z96612 Presence of left artificial shoulder joint: Secondary | ICD-10-CM | POA: Diagnosis not present

## 2016-08-21 DIAGNOSIS — M6281 Muscle weakness (generalized): Secondary | ICD-10-CM | POA: Diagnosis not present

## 2016-08-21 DIAGNOSIS — M25612 Stiffness of left shoulder, not elsewhere classified: Secondary | ICD-10-CM | POA: Diagnosis not present

## 2016-08-21 DIAGNOSIS — M25512 Pain in left shoulder: Secondary | ICD-10-CM | POA: Diagnosis not present

## 2016-08-22 NOTE — Telephone Encounter (Signed)
Chelley - give him the option of the PAN foundation to pay for meds.  Apparently they are currently funded for Heart Failure.  [phone].

## 2016-08-22 NOTE — Telephone Encounter (Signed)
Returned call to patient. Patient requested to run a PA prior to using the PAN foundation. Hettie Holstein PA completed, signed, and faxed to [facility]. Awaiting approval.

## 2016-08-23 DIAGNOSIS — M25612 Stiffness of left shoulder, not elsewhere classified: Secondary | ICD-10-CM | POA: Diagnosis not present

## 2016-08-23 DIAGNOSIS — M6281 Muscle weakness (generalized): Secondary | ICD-10-CM | POA: Diagnosis not present

## 2016-08-23 DIAGNOSIS — M25512 Pain in left shoulder: Secondary | ICD-10-CM | POA: Diagnosis not present

## 2016-08-25 ENCOUNTER — Encounter: Payer: Self-pay | Admitting: Cardiology

## 2016-08-25 ENCOUNTER — Ambulatory Visit (INDEPENDENT_AMBULATORY_CARE_PROVIDER_SITE_OTHER): Payer: Medicare Other | Admitting: Cardiology

## 2016-08-25 ENCOUNTER — Encounter (INDEPENDENT_AMBULATORY_CARE_PROVIDER_SITE_OTHER): Payer: Self-pay

## 2016-08-25 ENCOUNTER — Other Ambulatory Visit: Payer: Self-pay | Admitting: Cardiology

## 2016-08-25 VITALS — BP 138/96 | HR 79 | Ht 71.0 in | Wt 225.0 lb

## 2016-08-25 DIAGNOSIS — I4821 Permanent atrial fibrillation: Secondary | ICD-10-CM

## 2016-08-25 DIAGNOSIS — I4430 Unspecified atrioventricular block: Secondary | ICD-10-CM

## 2016-08-25 DIAGNOSIS — I5022 Chronic systolic (congestive) heart failure: Secondary | ICD-10-CM | POA: Diagnosis not present

## 2016-08-25 DIAGNOSIS — I428 Other cardiomyopathies: Secondary | ICD-10-CM | POA: Diagnosis not present

## 2016-08-25 DIAGNOSIS — I482 Chronic atrial fibrillation: Secondary | ICD-10-CM | POA: Diagnosis not present

## 2016-08-25 DIAGNOSIS — Z01812 Encounter for preprocedural laboratory examination: Secondary | ICD-10-CM

## 2016-08-25 LAB — CUP PACEART INCLINIC DEVICE CHECK
Battery Voltage: 2.93 V
Brady Statistic RV Percent Paced: 99.97 %
Date Time Interrogation Session: 20180202171921
Implantable Lead Implant Date: 20130715
Implantable Lead Implant Date: 20130715
Implantable Lead Location: 753859
Implantable Lead Location: 753860
Implantable Pulse Generator Implant Date: 20130715
Lead Channel Impedance Value: 400 Ohm
Lead Channel Impedance Value: 544 Ohm
Lead Channel Pacing Threshold Amplitude: 1 V
Lead Channel Pacing Threshold Pulse Width: 0.4 ms
Lead Channel Sensing Intrinsic Amplitude: 0.429 mV
Lead Channel Setting Pacing Amplitude: 2.5 V
Lead Channel Setting Pacing Pulse Width: 0.4 ms
Lead Channel Setting Sensing Sensitivity: 0.9 mV

## 2016-08-25 NOTE — Telephone Encounter (Signed)
PA for [facility] approved. Called patient, he states it will cost him $76.  Gave Mr Delsordo phone for PAN foundation just in case medication becomes too costly in the future.

## 2016-08-25 NOTE — Addendum Note (Signed)
Addended by: Stanton Kidney on: 08/25/2016 12:09 PM   Modules accepted: Orders

## 2016-08-25 NOTE — Patient Instructions (Addendum)
Medication Instructions:    Your physician recommends that you continue on your current medications as directed. Please refer to the Current Medication list given to you today.  --- If you need a refill on your cardiac medications before your next appointment, please call your pharmacy. ---  Labwork:  None ordered  Testing/Procedures: Your physician has recommended that you have your device upgraded to a Bi-Ventricular defibrillator.  March dates given to you.  Tycen Dockter, RN will call you in the next couple of weeks to  arrange procedure date.  Follow-Up:  To be determined once procedure is scheduled.  Thank you for choosing [facility]!!   Trinidad Curet, RN [phone]  Any Other Special Instructions Will Be Listed Below (If Applicable).   Cardioverter Defibrillator Implantation An implantable cardioverter defibrillator (ICD) is a small, lightweight, battery-powered device that is placed (implanted) under the skin in the chest or abdomen. Your caregiver may prescribe an ICD if:  You have had an irregular heart rhythm (arrhythmia) that originated in the lower chambers of the heart (ventricles).  Your heart has been damaged by a disease (such as coronary artery disease) or heart condition (such as a heart attack). An ICD consists of a battery that lasts several years, a small computer called a pulse generator, and wires called leads that go into the heart. It is used to detect and correct two dangerous arrhythmias: a rapid heart rhythm (tachycardia) and an arrhythmia in which the ventricles contract in an uncoordinated way (fibrillation). When an ICD detects tachycardia, it sends an electrical signal to the heart that restores the heartbeat to normal (cardioversion). This signal is usually painless. If cardioversion does not work or if the ICD detects fibrillation, it delivers a small electrical shock to the heart (defibrillation) to restart the heart. The shock may feel like a  strong jolt in the chest.ICDs may be programmed to correct other problems. Sometimes, ICDs are programmed to act as another type of implantable device called a pacemaker. Pacemakers are used to treat a slow heartbeat (bradycardia). What are the risks? Generally, the procedure to implant an ICD is safe. However, as with any surgical procedure, complications can occur. Possible complications associated with implanting an ICD include:  Swelling, bleeding, or bruising at the site where the ICD was implanted.  Infection at the site where the ICD was implanted.  A reaction to medicine used during the procedure.  Nerve, heart, or blood vessel damage.  Blood clots. What happens before the procedure?  You may need to have blood tests, heart tests, or a chest X-ray done before the day of the procedure.  Ask your caregiver about changing or stopping your regular medicines.  Make plans to have someone drive you home. You may need to stay in the hospital overnight after the procedure.  Stop smoking at least 24 hours before the procedure.  Take a bath or shower the night before the procedure. You may need to scrub your chest or abdomen with a special type of soap.  Do not eat or drink before your procedure for as long as directed by your caregiver. Ask if it is okay to take any needed medicine with a small sip of water. What happens during the procedure? The procedure to implant an ICD in your chest or abdomen is usually done at a hospital in a room that has a large X-ray machine called a fluoroscope. The machine will be above you during the procedure. It will help your caregiver see your heart  during the procedure. Implanting an ICD usually takes 1-3 hours. Before the procedure:  Small monitors will be put on your body. They will be used to check your heart, blood pressure, and oxygen level.  A needle will be put into a vein in your hand or arm. This is called an intravenous (IV) access tube.  Fluids and medicine will flow directly into your body through the IV tube.  Your chest or abdomen will be cleaned with a germ-killing (antiseptic) solution. The area may be shaved.  You may be given medicine to help you relax (sedative).  You will be given a medicine called a local anesthetic. This medicine will make the surgical site numb while the ICD is implanted. You will be sleepy but awake during the procedure. After you are numb the procedure will begin. The caregiver will:  Make a small cut (incision). This will make a pocket deep under your skin that will hold the pulse generator.  Guide the leads through a large blood vessel into your heart and attach them to the heart muscles. Depending on the ICD, the leads may go into one ventricle or they may go to both ventricles and into an upper chamber of the heart (atrium).  Test the ICD.  Close the incision with stitches, glue, or staples. What happens after the procedure?  You may feel pain. Some pain is normal. It may last a few days.  You may stay in a recovery area until the local anesthetic has worn off. Your blood pressure and pulse will be checked often. You will be taken to a room where your heart will be monitored.  A chest X-ray will be taken. This is done to check that the cardioverter defibrillator is in the right place.  You may stay in the hospital overnight.  A slight bump may be seen over the skin where the ICD was placed. Sometimes, it is possible to feel the ICD under the skin. This is normal.  In the months and years afterward, your caregiver will check the device, the leads, and the battery every few months. Eventually, when the battery is low, the ICD will be replaced. Contact a health care provider if:  Any allergies you have.  All medicines you are taking, including vitamins, herbs, eyedrops, and over-the-counter medicines and creams.  Previous problems you or members of your family have had with the use  of anesthetics.  Any blood disorders you have had.  Other health problems you have. This information is not intended to replace advice given to you by your health care provider. Make sure you discuss any questions you have with your health care provider. Document Released: 04/01/2002 Document Revised: 12/16/2015 Document Reviewed: 07/29/2012 Elsevier Interactive Patient Education  2017 [employer].

## 2016-08-25 NOTE — Progress Notes (Signed)
Electrophysiology Office Note   Date:  08/25/2016   ID:  Mason [name], DOB Apr 22, 1941, MRN [id number]  PCP:  Tammi Sou, MD  Cardiologist:  [place] Primary Electrophysiologist:  Maquita Sandoval Meredith Leeds, MD    Chief Complaint  Patient presents with  . Pacemaker Check    Chronic systolic heart failure/Complete heart block     History of Present Illness: Mason [name] is a 76 y.o. male who presents today for electrophysiology evaluation.   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 He showed a gradual decline in LV systolic function and has recently developed exertional dyspnea and heart failure symptoms. An echocardiogram in August showed an EF of 40% with diffuse hypokinesis. In October he had a nuclear stress test that showed all perfusion but an EF of 32%. Has minor coronary atherosclerosis and normal LV EDP. He had a dual chamber Medtronic Revo pacemaker implanted July 2013. He is programmed VVIR for permanent atrial fibrillation. Currently takes rivaroxaban for anticoagulation. He is feeling well with only fatigue and minimal amounts of shortness of breath.   Today, he denies symptoms of palpitations, chest pain, orthopnea, PND, lower extremity edema, claudication, dizziness, presyncope, syncope, bleeding, or neurologic sequela. The patient is tolerating medications without difficulties and is otherwise without complaint today.    Past Medical History:  Diagnosis Date  . Arthritis    left shoulder (Dr. [name]): steroid injection 04/03/16.  Plan for TSA surgery 06/2016.  [name] BPH (benign prostatic hypertrophy) 06/2011   Irritative sx's; pt declined trial of anticholinergic per Urology records  . Chronic combined systolic and diastolic heart failure ([place]) 05/31/2012   Nonischemic:  EF 40-45%, LA mod-severe dilated, AFIB.   02/2016 EF 40%, diffuse hypokinesis, grade 2 DD.  Myoc perf imaging showed EF 32% 04/2016.  Pt to get upgrade to CRT-D as of 07/2016.    [name] Chronic low back pain   . Chronic renal insufficiency, stage III (moderate) 2015   CrCl about 60 ml/min  . Depression   . Diabetes mellitus   . DOE (dyspnea on exertion)    NYHA class I CHF  . Episodic low back pain 01/22/2013   w/intermittent radiculitis (12/2014 his neurologist referred him to pain mgmt for epidural steroid injection)  . GERD (gastroesophageal reflux disease)   . H/O tilt table evaluation 11/02/05   negative  . Helicobacter pylori gastritis 01/2016  . History of adenomatous polyp of colon 10/12/11   Dr. [name] (3 right side of colon- tubular adenomas removed)  . History of cardiovascular stress test 05/28/12   no ischemia, EF 37%, imaging results are unchanged and within normal variance  . History of chronic prostatitis   . History of retinal detachment    OS  . History of vertigo    + Hx of posterior HA's.  Neuro (Dr. Erling Cruz) eval 2011.  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  . Hypertension   . Lumbar spondylosis    lumbosacral radiculopathy at L4 by EMG testing, right foot drop (neurologist is Dr. Linus Salmons with [facility] in [place])--neurologist referred him to neurosurgery  . Nephrolithiasis 07/2012   Left UVJ 2 mm stone with dilation of renal collecting system and slight hydroureter on right  . 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  Since no CAD on cath 05/2016, cards recommends upgrade to CRT-D.  [name] Permanent atrial fibrillation ([place])    DCCV 07/09/13-converted,  lasted two days, then back into afib--needs lifetime anticoagulation (Xarelto as of 09/2014)   Past Surgical History:  Procedure Laterality Date  . CARDIAC CATHETERIZATION N/A 06/14/2016   Minimal nonobstructive dz, EF 25-35%.  Procedure: Left Heart Cath and Coronary Angiography;  Surgeon: Peter M Martinique, MD;  Location: [place] [facility];  Service:  Cardiovascular;  Laterality: N/A;  . CARDIOVASCULAR STRESS TEST  2012   2012 nuclear perfusion study: low risk scan; 04/2016 normal myocardial perfusion imaging, EF 32%.  [name] CARDIOVERSION  07/09/2012   Procedure: CARDIOVERSION;  Surgeon: [name], MD;  [facility];  Service: Cardiovascular;  Laterality: N/A;  . COLONOSCOPY W/ POLYPECTOMY  approx 2006; repeated 09/2011   Polyps on 2013 EGD as well, repeat 12/2014  . ESOPHAGOGASTRODUODENOSCOPY  10/18/06   Done due to chronic GERD: Normal, bx showed no barrett's esophagus (Dr. [name])  . EYE SURGERY     retinal detachment OS+ cataract surgery  . INSERT / REPLACE / REMOVE PACEMAKER  02/05/2012   dual chamber, sinus node dysfunction, sinus arrest, PAF, Medtronic Revo serial#[id number] H: last checked 05/2015  . LUMBAR LAMINECTOMY  1976   left L4-5  . PERMANENT PACEMAKER INSERTION N/A 02/05/2012   Procedure: PERMANENT PACEMAKER INSERTION;  Surgeon: [name], MD; Generator Medtronic Revo model [place] serial number [id number] H Laterality: N/A;  . SHOULDER SURGERY    . TRANSTHORACIC ECHOCARDIOGRAM  08/25/10; 05/2012; 03/23/16   mild asymmetric LVH, normal systolic function, normal diastolic fxn, mild-to-mod mitral regurg, mild aortic valve sclerosis and trace AI, mild aortic root dilatation. 2014 f/u showed EF 40-45%, mod LAE, A FIB.  02/2016 EF 40%, diffuse hypokinesis, grade 2 DD.     Current Outpatient Prescriptions  Medication Sig Dispense Refill  . blood glucose meter kit and supplies KIT Use to check blood sugar 1 to 2 times daily 1 each 0  . colchicine 0.6 MG tablet Take 0.6 mg by mouth as needed.    . COMBIGAN 0.2-0.5 % ophthalmic solution Place 1 drop into both eyes 2 (two) times daily.     [name] gabapentin (NEURONTIN) 300 MG capsule TAKE 1 CAPSULE THREE TIMES A DAY 270 capsule 3  . GLIPIZIDE XL 5 MG 24 hr tablet TAKE 1 TABLET EVERY MORNING 90 tablet 1  . glucose blood test strip Use as instructed 100 each 3  . glucose blood test  strip Use to check blood sugars 1-2 times daily 100 each 1  . JANUVIA 100 MG tablet TAKE 1 TABLET DAILY 90 tablet 1  . Lancets (PRECISION ULTRA LANCET) MISC Use as instructed 100 each 3  . latanoprost (XALATAN) 0.005 % ophthalmic solution Place 1 drop into both eyes at bedtime.   12  . losartan (COZAAR) 100 MG tablet Take 100 mg by mouth daily.    . metFORMIN (GLUCOPHAGE) 1000 MG tablet Take 1,000 mg by mouth 2 (two) times daily with a meal.     . metoprolol succinate (TOPROL-XL) 100 MG 24 hr tablet 1 and 1/2 tabs po qd 135 tablet 3  . omeprazole (PRILOSEC) 40 MG capsule Take 1 capsule (40 mg total) by mouth daily. (Patient taking differently: Take 40 mg by mouth daily as needed (indigestion). ) 90 capsule 3  . ONE TOUCH LANCETS MISC Use to check blood sugar 1-2 times daily 200 each 0  . rOPINIRole (REQUIP) 1 MG tablet TAKE 1 TABLET DAILY (NEED OFFICE VISIT FOR MORE REFILLS) (Patient taking differently: Take '1mg'$  at bedtime) 90 tablet 1  . sertraline (ZOLOFT) 100 MG tablet  TAKE 1 TABLET DAILY 90 tablet 1  . simvastatin (ZOCOR) 20 MG tablet TAKE 1 TABLET EVERY EVENING 90 tablet 1  . XARELTO 20 MG TABS tablet TAKE 1 TABLET DAILY WITH SUPPER 90 tablet 1   No current facility-administered medications for this visit.     Allergies:   Patient has no known allergies.   Social History:  The patient  reports that he has never smoked. He has never used smokeless tobacco. He reports that he drinks about 7.2 oz of alcohol per week . He reports that he does not use drugs.   Family History:  The patient's family history includes Heart failure in his mother; Stroke in his father and mother.    ROS:  Please see the history of present illness.   Otherwise, review of systems is positive for fatigue, SOB.   All other systems are reviewed and negative.    PHYSICAL EXAM: VS:  BP (!) 138/96   Pulse 79   Ht 5' 11"  (1.803 m)   Wt 225 lb (102.1 kg)   BMI 31.38 kg/m  , BMI Body mass index is 31.38 kg/m. GEN:  Well nourished, well developed, in no acute distress  HEENT: normal  Neck: no JVD, carotid bruits, or masses Cardiac: RRR; no murmurs, rubs, or gallops,no edema  Respiratory:  clear to auscultation bilaterally, normal work of breathing GI: soft, nontender, nondistended, + BS MS: no deformity or atrophy  Skin: warm and dry,  device pocket is well healed Neuro:  Strength and sensation are intact Psych: euthymic mood, full affect  EKG:  EKG is ordered today. Personal review of the ekg ordered shows atrial fibrillation, V pace   Device interrogation is reviewed today in detail.  See PaceArt for details.   Recent Labs: 02/21/2016: ALT 17 06/06/2016: BUN 16; Creat 1.21; Hemoglobin 14.3; Platelets 177; Potassium 4.4; Sodium 137; TSH 3.37    Lipid Panel     Component Value Date/Time   CHOL 148 07/03/2016 0832   TRIG 172.0 (H) 07/03/2016 0832   HDL 41.40 07/03/2016 0832   CHOLHDL 4 07/03/2016 0832   VLDL 34.4 07/03/2016 0832   LDLCALC 72 07/03/2016 0832     Wt Readings from Last 3 Encounters:  08/25/16 225 lb (102.1 kg)  08/04/16 231 lb (104.8 kg)  07/31/16 225 lb (102.1 kg)      Other studies Reviewed: Additional studies/ records that were reviewed today include: Cardiac cath 06/14/16, TTE 03/23/16  Review of the above records today demonstrates:   There is severe left ventricular systolic dysfunction.  The left ventricular ejection fraction is 25-35% by visual estimate.  LV end diastolic pressure is normal.   1. Minimal nonobstructive CAD 2. Severe LV dysfunction 3. Normal LV EDP  - Left ventricle: The cavity size was normal. Wall thickness was   increased in a pattern of mild LVH. The estimated ejection   fraction was 40%. Diffuse hypokinesis. Features are consistent   with a pseudonormal left ventricular filling pattern, with   concomitant abnormal relaxation and increased filling pressure   (grade 2 diastolic dysfunction). - Aortic valve: There was no  stenosis. - Aorta: Ascending aortic diameter: 43 mm (S). - Ascending aorta: The ascending aorta was mildly dilated. - Mitral valve: There was trivial regurgitation. - Left atrium: The atrium was mildly dilated. - Right ventricle: The cavity size was normal. Pacer wire or   catheter noted in right ventricle. Systolic function was mildly   reduced. - Right atrium: The atrium was  mildly dilated. - Tricuspid valve: Peak RV-RA gradient (S): 26 mm Hg. - Pulmonary arteries: PA peak pressure: 34 mm Hg (S). - Systemic veins: IVC measured 2.0 cm with < 50% respirophasic   variation, suggesting RA pressure 8 mmHg.   ASSESSMENT AND PLAN:  1.  Permanent atrial fibrillation: Early anticoagulated with Xarelto.  This patients CHA2DS2-VASc Score and unadjusted Ischemic Stroke Rate (% per year) is equal to 4.8 % stroke rate/year from a score of 4  Above score calculated as 1 point each if present [CHF, HTN, DM, Vascular=MI/PAD/Aortic Plaque, Age if 65-74, or Male] Above score calculated as 2 points each if present [Age > 75, or Stroke/TIA/TE]  2. Systolic heart failure: EF has decraeased as he has become 100% paced. Would likely benefit from upgrade to CRT. Risks and benefits were discussed. Risks include bleeding, infection, tamponade, and pneumothorax, among others. He understands these risks and has agreed to CRT upgrade.  3. Complete AV block: s/p pacemaker, 100% paced  4. Hypertension: good control  Current medicines are reviewed at length with the patient today.   The patient does not have concerns regarding his medicines.  The following changes were made today:  none  Labs/ tests ordered today include:  No orders of the defined types were placed in this encounter.    Disposition:   FU with Lesette Frary 3 months  Signed, Amadi Frady Meredith Leeds, MD  08/25/2016 11:19 AM     [facility] [address] [place] [address] [phone] (office) [phone]  (fax)

## 2016-08-27 ENCOUNTER — Encounter: Payer: Self-pay | Admitting: Family Medicine

## 2016-08-28 DIAGNOSIS — M19012 Primary osteoarthritis, left shoulder: Secondary | ICD-10-CM | POA: Diagnosis not present

## 2016-08-28 DIAGNOSIS — M25512 Pain in left shoulder: Secondary | ICD-10-CM | POA: Diagnosis not present

## 2016-08-29 NOTE — Addendum Note (Signed)
Addended by: Stanton Kidney on: 08/29/2016 04:33 PM   Modules accepted: Orders

## 2016-08-31 DIAGNOSIS — M25512 Pain in left shoulder: Secondary | ICD-10-CM | POA: Diagnosis not present

## 2016-08-31 DIAGNOSIS — M19012 Primary osteoarthritis, left shoulder: Secondary | ICD-10-CM | POA: Diagnosis not present

## 2016-09-04 DIAGNOSIS — M25512 Pain in left shoulder: Secondary | ICD-10-CM | POA: Diagnosis not present

## 2016-09-05 DIAGNOSIS — H02831 Dermatochalasis of right upper eyelid: Secondary | ICD-10-CM | POA: Diagnosis not present

## 2016-09-05 DIAGNOSIS — H401112 Primary open-angle glaucoma, right eye, moderate stage: Secondary | ICD-10-CM | POA: Diagnosis not present

## 2016-09-05 DIAGNOSIS — H02834 Dermatochalasis of left upper eyelid: Secondary | ICD-10-CM | POA: Diagnosis not present

## 2016-09-05 DIAGNOSIS — H401123 Primary open-angle glaucoma, left eye, severe stage: Secondary | ICD-10-CM | POA: Diagnosis not present

## 2016-09-05 DIAGNOSIS — Z961 Presence of intraocular lens: Secondary | ICD-10-CM | POA: Diagnosis not present

## 2016-09-06 DIAGNOSIS — M19012 Primary osteoarthritis, left shoulder: Secondary | ICD-10-CM | POA: Diagnosis not present

## 2016-09-06 DIAGNOSIS — M25512 Pain in left shoulder: Secondary | ICD-10-CM | POA: Diagnosis not present

## 2016-09-07 DIAGNOSIS — N401 Enlarged prostate with lower urinary tract symptoms: Secondary | ICD-10-CM | POA: Diagnosis not present

## 2016-09-07 DIAGNOSIS — N3281 Overactive bladder: Secondary | ICD-10-CM | POA: Diagnosis not present

## 2016-09-07 DIAGNOSIS — N5201 Erectile dysfunction due to arterial insufficiency: Secondary | ICD-10-CM | POA: Diagnosis not present

## 2016-09-08 DIAGNOSIS — M19012 Primary osteoarthritis, left shoulder: Secondary | ICD-10-CM | POA: Diagnosis not present

## 2016-09-08 DIAGNOSIS — M25512 Pain in left shoulder: Secondary | ICD-10-CM | POA: Diagnosis not present

## 2016-09-09 ENCOUNTER — Encounter: Payer: Self-pay | Admitting: Family Medicine

## 2016-09-13 DIAGNOSIS — M25512 Pain in left shoulder: Secondary | ICD-10-CM | POA: Diagnosis not present

## 2016-09-13 DIAGNOSIS — M19012 Primary osteoarthritis, left shoulder: Secondary | ICD-10-CM | POA: Diagnosis not present

## 2016-09-15 DIAGNOSIS — M25512 Pain in left shoulder: Secondary | ICD-10-CM | POA: Diagnosis not present

## 2016-09-15 DIAGNOSIS — M19012 Primary osteoarthritis, left shoulder: Secondary | ICD-10-CM | POA: Diagnosis not present

## 2016-09-19 DIAGNOSIS — M19012 Primary osteoarthritis, left shoulder: Secondary | ICD-10-CM | POA: Diagnosis not present

## 2016-09-19 DIAGNOSIS — M25512 Pain in left shoulder: Secondary | ICD-10-CM | POA: Diagnosis not present

## 2016-09-21 DIAGNOSIS — M19012 Primary osteoarthritis, left shoulder: Secondary | ICD-10-CM | POA: Diagnosis not present

## 2016-09-21 DIAGNOSIS — M25512 Pain in left shoulder: Secondary | ICD-10-CM | POA: Diagnosis not present

## 2016-09-22 ENCOUNTER — Encounter: Payer: Self-pay | Admitting: Family Medicine

## 2016-09-22 ENCOUNTER — Ambulatory Visit (INDEPENDENT_AMBULATORY_CARE_PROVIDER_SITE_OTHER): Payer: Medicare Other | Admitting: Family Medicine

## 2016-09-22 VITALS — BP 133/92 | HR 78 | Temp 98.1°F | Resp 16 | Ht 71.0 in | Wt 224.2 lb

## 2016-09-22 DIAGNOSIS — M25561 Pain in right knee: Secondary | ICD-10-CM

## 2016-09-22 DIAGNOSIS — M25461 Effusion, right knee: Secondary | ICD-10-CM | POA: Diagnosis not present

## 2016-09-22 MED ORDER — PREDNISONE 20 MG PO TABS: ORAL_TABLET | ORAL | 0 refills | Status: DC

## 2016-09-22 NOTE — Progress Notes (Signed)
Pre visit review using our clinic review tool, if applicable. No additional management support is needed unless otherwise documented below in the visit note. 

## 2016-09-22 NOTE — Progress Notes (Signed)
OFFICE VISIT  09/22/2016   CC:  Chief Complaint  Patient presents with  . Knee Pain    Right x 2 days, no known injury, has some swelling, painful when he bends his knee, pain located behind his knee   HPI:    Patient is a 76 y.o. Caucasian male who presents for right knee pain and swelling. Onset 2 d/a, pain in popliteal area/back of knee, extends to the sides of his R knee as well, swelling is diffuse. Feels warm.  No redness.  No preceding fall/strain/trauma.  No fevers or malaise. He otherwise feels well.  Past Medical History:  Diagnosis Date  . Arthritis    left shoulder (Dr. [name]): steroid injection 04/03/16.  Plan for TSA surgery 06/2016.  [name] BPH (benign prostatic hypertrophy) 06/2011   Irritative sx's; pt declined trial of anticholinergic per Urology records  . Chronic combined systolic and diastolic heart failure ([place]) 05/31/2012   Nonischemic:  EF 40-45%, LA mod-severe dilated, AFIB.   02/2016 EF 40%, diffuse hypokinesis, grade 2 DD.  Myoc perf imaging showed EF 32% 04/2016.  Pt to get upgrade to CRT-D as of 08/2016.  [name] Chronic low back pain   . Chronic renal insufficiency, stage III (moderate) 2015   CrCl about 60 ml/min  . Depression   . Diabetes mellitus   . DOE (dyspnea on exertion)    NYHA class I CHF  . Episodic low back pain 01/22/2013   w/intermittent radiculitis (12/2014 his neurologist referred him to pain mgmt for epidural steroid injection)  . GERD (gastroesophageal reflux disease)   . H/O tilt table evaluation 11/02/05   negative  . Helicobacter pylori gastritis 01/2016  . History of adenomatous polyp of colon 10/12/11   Dr. [name] (3 right side of colon- tubular adenomas removed)  . History of cardiovascular stress test 05/28/12   no ischemia, EF 37%, imaging results are unchanged and within normal variance  . History of chronic prostatitis   . History of retinal detachment    OS  . History of vertigo    + Hx of posterior HA's.  Neuro (Dr. Erling Cruz)  eval 2011.  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  . Hypertension   . Lumbar spondylosis    lumbosacral radiculopathy at L4 by EMG testing, right foot drop (neurologist is Dr. Linus Salmons with [facility] in [place])--neurologist referred him to neurosurgery  . Nephrolithiasis 07/2012   Left UVJ 2 mm stone with dilation of renal collecting system and slight hydroureter on right  . 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  Since no CAD on cath 05/2016, cards recommends upgrade to CRT-D.  [name] Permanent atrial fibrillation ([place])    DCCV 07/09/13-converted, lasted two days, then back into afib--needs lifetime anticoagulation (Xarelto as of 09/2014)    Past Surgical History:  Procedure Laterality Date  . CARDIAC CATHETERIZATION N/A 06/14/2016   Minimal nonobstructive dz, EF 25-35%.  Procedure: Left Heart Cath and Coronary Angiography;  Surgeon: Peter M Martinique, MD;  Location: [place] [facility];  Service: Cardiovascular;  Laterality: N/A;  . CARDIOVASCULAR STRESS TEST  2012   2012 nuclear perfusion study: low risk scan; 04/2016 normal myocardial perfusion imaging, EF 32%.  [name] CARDIOVERSION  07/09/2012   Procedure: CARDIOVERSION;  Surgeon: [name], MD;  Location: [place] [facility]OS[facility];  Service: Cardiovascular;  Laterality: N/A;  . COLONOSCOPY W/ POLYPECTOMY  approx 2006; repeated 09/2011   Polyps  on 2013 EGD as well, repeat 12/2014  . ESOPHAGOGASTRODUODENOSCOPY  10/18/06   Done due to chronic GERD: Normal, bx showed no barrett's esophagus (Dr. [name])  . EYE SURGERY     retinal detachment OS+ cataract surgery  . INSERT / REPLACE / REMOVE PACEMAKER  02/05/2012   dual chamber, sinus node dysfunction, sinus arrest, PAF, Medtronic Revo serial#-[id number] H: last checked 05/2015  . LUMBAR LAMINECTOMY  1976   left L4-5  . PERMANENT PACEMAKER INSERTION N/A 02/05/2012    Procedure: PERMANENT PACEMAKER INSERTION;  Surgeon: [name], MD; Generator Medtronic [place] model [place] serial number [id number] H Laterality: N/A;  . SHOULDER SURGERY Left 2018   Left shoulder reverse TSA [name] in [place]  . TRANSTHORACIC ECHOCARDIOGRAM  08/25/10; 05/2012; 03/23/16   mild asymmetric LVH, normal systolic function, normal diastolic fxn, mild-to-mod mitral regurg, mild aortic valve sclerosis and trace AI, mild aortic root dilatation. 2014 f/u showed EF 40-45%, mod LAE, A FIB.  02/2016 EF 40%, diffuse hypokinesis, grade 2 DD.    Outpatient Medications Prior to Visit  Medication Sig Dispense Refill  . blood glucose meter kit and supplies KIT Use to check blood sugar 1 to 2 times daily 1 each 0  . colchicine 0.6 MG tablet Take 0.6 mg by mouth as needed.    . COMBIGAN 0.2-0.5 % ophthalmic solution Place 1 drop into both eyes 2 (two) times daily.     [name] gabapentin (NEURONTIN) 300 MG capsule TAKE 1 CAPSULE THREE TIMES A DAY 270 capsule 3  . GLIPIZIDE XL 5 MG 24 hr tablet TAKE 1 TABLET EVERY MORNING 90 tablet 1  . glucose blood test strip Use to check blood sugars 1-2 times daily 100 each 1  . JANUVIA 100 MG tablet TAKE 1 TABLET DAILY 90 tablet 1  . Lancets (PRECISION ULTRA LANCET) MISC Use as instructed 100 each 3  . latanoprost (XALATAN) 0.005 % ophthalmic solution Place 1 drop into both eyes at bedtime.   12  . metFORMIN (GLUCOPHAGE) 1000 MG tablet Take 1,000 mg by mouth 2 (two) times daily with a meal.     . metoprolol succinate (TOPROL-XL) 100 MG 24 hr tablet 1 and 1/2 tabs po qd 135 tablet 3  . omeprazole (PRILOSEC) 40 MG capsule Take 1 capsule (40 mg total) by mouth daily. (Patient taking differently: Take 40 mg by mouth daily as needed (indigestion). ) 90 capsule 3  . ONE TOUCH LANCETS MISC Use to check blood sugar 1-2 times daily 200 each 0  . rOPINIRole (REQUIP) 1 MG tablet TAKE 1 TABLET DAILY (NEED OFFICE VISIT FOR MORE REFILLS) (Patient taking differently: Take  70m at bedtime) 90 tablet 1  . sertraline (ZOLOFT) 100 MG tablet TAKE 1 TABLET DAILY 90 tablet 1  . simvastatin (ZOCOR) 20 MG tablet TAKE 1 TABLET EVERY EVENING 90 tablet 1  . XARELTO 20 MG TABS tablet TAKE 1 TABLET DAILY WITH SUPPER 90 tablet 1  . losartan (COZAAR) 100 MG tablet Take 100 mg by mouth daily.    .Marland Kitchenglucose blood test strip Use as instructed (Patient not taking: Reported on 09/22/2016) 100 each 3   No facility-administered medications prior to visit.     No Known Allergies  ROS As per HPI  PE: Blood pressure (!) 133/92, pulse 78, temperature 98.1 F (36.7 C), temperature source Oral, resp. rate 16, height _0  (1.803 m), weight 224 lb 4 oz (101.7 kg), SpO2 94 %. Gen: Alert, well appearing.  Patient is oriented to person, place,  time, and situation. AFFECT: pleasant, lucid thought and speech. Right knee: noted asymmetry on observation compared to left. Diffuse swelling of R knee noted, + effusion.  +Warmth over entire R knee.  No erythema.  No TTP anteriorly.  Minimal tenderness to palpation over medial and lateral aspects of knee.  No ligamentous instability. Patellar grind neg.  He can passively flex R knee to 120 deg, but it starts hurting in back of knee when he gets past 90 deg.  I cannot palpate any abnormality in popliteal fossa, nor is he tender there.  LABS:  none  IMPRESSION AND PLAN:  1) Right knee effusion, acute.   Cannot do diagnostic/therapeutic aspiration today b/c pt is on xarelto and I think the risks outweigh the benefits in this case. Will check plain film of R knee. 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  Prednisone 61m qd x 5d rx'd.  An After Visit Summary was printed and given to the patient.  FOLLOW UP: Return if symptoms worsen or fail to improve.  Signed:  PCrissie Sickles MD           09/22/2016

## 2016-09-23 ENCOUNTER — Ambulatory Visit (HOSPITAL_BASED_OUTPATIENT_CLINIC_OR_DEPARTMENT_OTHER)
Admission: RE | Admit: 2016-09-23 | Discharge: 2016-09-23 | Disposition: A | Discharge status: Home / Self Care | Payer: Medicare Other | Source: Ambulatory Visit | Attending: Family Medicine | Admitting: Family Medicine

## 2016-09-23 DIAGNOSIS — M25461 Effusion, right knee: Secondary | ICD-10-CM | POA: Diagnosis not present

## 2016-09-23 DIAGNOSIS — M25561 Pain in right knee: Secondary | ICD-10-CM | POA: Insufficient documentation

## 2016-09-23 DIAGNOSIS — M1711 Unilateral primary osteoarthritis, right knee: Secondary | ICD-10-CM | POA: Diagnosis not present

## 2016-09-24 ENCOUNTER — Emergency Department (HOSPITAL_BASED_OUTPATIENT_CLINIC_OR_DEPARTMENT_OTHER): Payer: Medicare Other

## 2016-09-24 ENCOUNTER — Encounter (HOSPITAL_BASED_OUTPATIENT_CLINIC_OR_DEPARTMENT_OTHER): Payer: Self-pay | Admitting: Emergency Medicine

## 2016-09-24 ENCOUNTER — Emergency Department (HOSPITAL_BASED_OUTPATIENT_CLINIC_OR_DEPARTMENT_OTHER)
Admission: EM | Admit: 2016-09-24 | Discharge: 2016-09-24 | Disposition: A | Discharge status: Home / Self Care | Payer: Medicare Other | Attending: Emergency Medicine | Admitting: Emergency Medicine

## 2016-09-24 DIAGNOSIS — E1122 Type 2 diabetes mellitus with diabetic chronic kidney disease: Secondary | ICD-10-CM | POA: Insufficient documentation

## 2016-09-24 DIAGNOSIS — Z79899 Other long term (current) drug therapy: Secondary | ICD-10-CM | POA: Diagnosis not present

## 2016-09-24 DIAGNOSIS — I5042 Chronic combined systolic (congestive) and diastolic (congestive) heart failure: Secondary | ICD-10-CM | POA: Diagnosis not present

## 2016-09-24 DIAGNOSIS — S61215A Laceration without foreign body of left ring finger without damage to nail, initial encounter: Secondary | ICD-10-CM | POA: Diagnosis not present

## 2016-09-24 DIAGNOSIS — N183 Chronic kidney disease, stage 3 (moderate): Secondary | ICD-10-CM | POA: Insufficient documentation

## 2016-09-24 DIAGNOSIS — W270XXA Contact with workbench tool, initial encounter: Secondary | ICD-10-CM | POA: Diagnosis not present

## 2016-09-24 DIAGNOSIS — I130 Hypertensive heart and chronic kidney disease with heart failure and stage 1 through stage 4 chronic kidney disease, or unspecified chronic kidney disease: Secondary | ICD-10-CM | POA: Insufficient documentation

## 2016-09-24 DIAGNOSIS — Y939 Activity, unspecified: Secondary | ICD-10-CM | POA: Insufficient documentation

## 2016-09-24 DIAGNOSIS — Y999 Unspecified external cause status: Secondary | ICD-10-CM | POA: Diagnosis not present

## 2016-09-24 DIAGNOSIS — Z7984 Long term (current) use of oral hypoglycemic drugs: Secondary | ICD-10-CM | POA: Insufficient documentation

## 2016-09-24 DIAGNOSIS — Y929 Unspecified place or not applicable: Secondary | ICD-10-CM | POA: Diagnosis not present

## 2016-09-24 DIAGNOSIS — S61412A Laceration without foreign body of left hand, initial encounter: Secondary | ICD-10-CM | POA: Diagnosis not present

## 2016-09-24 MED ORDER — DEXTROSE 5 % IV SOLN
1.0000 g | Freq: Once | INTRAVENOUS | Status: AC
  Administered 2016-09-24: 1 g via INTRAVENOUS
  Filled 2016-09-24: qty 10

## 2016-09-24 MED ORDER — LIDOCAINE HCL (PF) 1 % IJ SOLN
30.0000 mL | Freq: Once | INTRAMUSCULAR | Status: AC
  Administered 2016-09-24: 10 mL
  Filled 2016-09-24: qty 30

## 2016-09-24 MED ORDER — CEPHALEXIN 500 MG PO CAPS: 500.0000 mg | ORAL_CAPSULE | Freq: Four times a day (QID) | ORAL | 0 refills | Status: DC

## 2016-09-24 NOTE — ED Triage Notes (Addendum)
Laceration to L ring finger by a skill saw, actively bleeding. Take blood thinners.

## 2016-09-24 NOTE — ED Notes (Signed)
Pt given d/c instructions as per chart. Verbalizes understanding. No questions. Rx x 1 

## 2016-09-24 NOTE — Discharge Instructions (Signed)
Medications: Keflex  Treatment: Take Keflex 4 times daily for 5 days, or until told otherwise by Dr. Grandville Silos. Leave dressing applied until your seen by Dr. Grandville Silos.  Follow-up: Please return to the emergency department or call Dr. Biagio Borg office immediately if you develop any fevers, increasing pain, streaking up your hand, or any other concerning symptoms. Please follow-up with Dr. Grandville Silos as directed by his office. They will call you on Monday or Tuesday.

## 2016-09-24 NOTE — ED Notes (Signed)
ED Provider at bedside. 

## 2016-09-24 NOTE — Consult Note (Signed)
ORTHOPAEDIC CONSULTATION HISTORY & PHYSICAL REQUESTING PHYSICIAN: Lajean Saver, MD  Chief Complaint: Left ring finger injury  HPI: Mason [name] is a 76 y.o. male who sustained a circular saw injury to the volar aspect of his left ring finger, largely transverse, overlying the middle phalanx.  Bleeding is minimal.  Past Medical History:  Diagnosis Date  . Arthritis    left shoulder (Dr. [name]): steroid injection 04/03/16.  Plan for TSA surgery 06/2016.  [name] BPH (benign prostatic hypertrophy) 06/2011   Irritative sx's; pt declined trial of anticholinergic per Urology records  . Chronic combined systolic and diastolic heart failure ([place]) 05/31/2012   Nonischemic:  EF 40-45%, LA mod-severe dilated, AFIB.   02/2016 EF 40%, diffuse hypokinesis, grade 2 DD.  Myoc perf imaging showed EF 32% 04/2016.  Pt to get upgrade to CRT-D as of 08/2016.  [name] Chronic low back pain   . Chronic renal insufficiency, stage III (moderate) 2015   CrCl about 60 ml/min  . Depression   . Diabetes mellitus   . DOE (dyspnea on exertion)    NYHA class I CHF  . Episodic low back pain 01/22/2013   w/intermittent radiculitis (12/2014 his neurologist referred him to pain mgmt for epidural steroid injection)  . GERD (gastroesophageal reflux disease)   . H/O tilt table evaluation 11/02/05   negative  . Helicobacter pylori gastritis 01/2016  . History of adenomatous polyp of colon 10/12/11   Dr. [name] (3 right side of colon- tubular adenomas removed)  . History of cardiovascular stress test 05/28/12   no ischemia, EF 37%, imaging results are unchanged and within normal variance  . History of chronic prostatitis   . History of retinal detachment    OS  . History of vertigo    + Hx of posterior HA's.  Neuro (Dr. Erling Cruz) eval 2011.  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  . Hypertension   . Lumbar spondylosis    lumbosacral radiculopathy at L4 by  EMG testing, right foot drop (neurologist is Dr. Linus Salmons with [facility] in [place])--neurologist referred him to neurosurgery  . Nephrolithiasis 07/2012   Left UVJ 2 mm stone with dilation of renal collecting system and slight hydroureter on right  . 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  Since no CAD on cath 05/2016, cards recommends upgrade to CRT-D.  [name] Permanent atrial fibrillation ([place])    DCCV 07/09/13-converted, lasted two days, then back into afib--needs lifetime anticoagulation (Xarelto as of 09/2014)   Past Surgical History:  Procedure Laterality Date  . CARDIAC CATHETERIZATION N/A 06/14/2016   Minimal nonobstructive dz, EF 25-35%.  Procedure: Left Heart Cath and Coronary Angiography;  Surgeon: Peter M Martinique, MD;  Location: [place] [facility];  Service: Cardiovascular;  Laterality: N/A;  . CARDIOVASCULAR STRESS TEST  2012   2012 nuclear perfusion study: low risk scan; 04/2016 normal myocardial perfusion imaging, EF 32%.  [name] CARDIOVERSION  07/09/2012   Procedure: CARDIOVERSION;  Surgeon: [name], MD;  Location[facility] END[facility];  Service: Cardiovascular;  Laterality: N/A;  . COLONOSCOPY W/ POLYPECTOMY  approx 2006; repeated 09/2011   Polyps on 2013 EGD as well, repeat 12/2014  . ESOPHAGOGASTRODUODENOSCOPY  10/18/06   Done due to chronic GERD: Normal, bx showed no barrett's esophagus (Dr. [name])  . EYE SURGERY     retinal detachment OS+ cataract surgery  . INSERT / REPLACE / REMOVE PACEMAKER  02/05/2012  dual chamber, sinus node dysfunction, sinus arrest, PAF, Medtronic Revo serial#[id number] H: last checked 05/2015  . LUMBAR LAMINECTOMY  1976   left L4-5  . PERMANENT PACEMAKER INSERTION N/A 02/05/2012   Procedure: PERMANENT PACEMAKER INSERTION;  Surgeon: [name], MD; Generator Medtronic [place] model [place] serial number [id number] H Laterality: N/A;  . SHOULDER SURGERY Left 2018   Left shoulder reverse TSA  [name] in [place]).  . TRANSTHORACIC ECHOCARDIOGRAM  08/25/10; 05/2012; 03/23/16   mild asymmetric LVH, normal systolic function, normal diastolic fxn, mild-to-mod mitral regurg, mild aortic valve sclerosis and trace AI, mild aortic root dilatation. 2014 f/u showed EF 40-45%, mod LAE, A FIB.  02/2016 EF 40%, diffuse hypokinesis, grade 2 DD.   Social History   Social History  . Marital status: Married    Spouse name: N/A  . Number of children: N/A  . Years of education: N/A   Social History Main Topics  . Smoking status: Never Smoker  . Smokeless tobacco: Never Used  . Alcohol use 7.2 oz/week    12 Cans of beer per week     Comment: 12 beers per week  . Drug use: No  . Sexual activity: Not Asked   Other Topics Concern  . None   Social History Narrative  . None   Family History  Problem Relation Age of Onset  . Heart failure Mother   . Stroke Mother   . Stroke Father    No Known Allergies Prior to Admission medications   Medication Sig Start Date End Date Taking? Authorizing Provider  blood glucose meter kit and supplies KIT Use to check blood sugar 1 to 2 times daily 07/06/16   Tammi Sou, MD  colchicine 0.6 MG tablet Take 0.6 mg by mouth as needed. 08/20/15   Historical Provider, MD  COMBIGAN 0.2-0.5 % ophthalmic solution Place 1 drop into both eyes 2 (two) times daily.  04/19/16   Historical Provider, MD  gabapentin (NEURONTIN) 300 MG capsule TAKE 1 CAPSULE THREE TIMES A DAY 10/20/15   Tammi Sou, MD  GLIPIZIDE XL 5 MG 24 hr tablet TAKE 1 TABLET EVERY MORNING 03/28/16   Tammi Sou, MD  glucose blood test strip Use to check blood sugars 1-2 times daily 06/28/16   Tammi Sou, MD  JANUVIA 100 MG tablet TAKE 1 TABLET DAILY 03/31/16   Tammi Sou, MD  Lancets (PRECISION ULTRA LANCET) MISC Use as instructed 11/12/14   Tammi Sou, MD  latanoprost (XALATAN) 0.005 % ophthalmic solution Place 1 drop into both eyes at bedtime.  04/24/15   Historical  Provider, MD  losartan (COZAAR) 100 MG tablet Take 100 mg by mouth daily.    Historical Provider, MD  metFORMIN (GLUCOPHAGE) 1000 MG tablet Take 1,000 mg by mouth 2 (two) times daily with a meal.  03/28/16   Historical Provider, MD  metoprolol succinate (TOPROL-XL) 100 MG 24 hr tablet 1 and 1/2 tabs po qd 12/09/15   Tammi Sou, MD  omeprazole (PRILOSEC) 40 MG capsule Take 1 capsule (40 mg total) by mouth daily. Patient taking differently: Take 40 mg by mouth daily as needed (indigestion).  12/14/15   Tammi Sou, MD  ONE TOUCH LANCETS MISC Use to check blood sugar 1-2 times daily 06/28/16   Tammi Sou, MD  predniSONE (DELTASONE) 20 MG tablet 2 tabs po qd x 5d 09/22/16   Tammi Sou, MD  rOPINIRole (REQUIP) 1 MG tablet TAKE 1 TABLET DAILY (NEED OFFICE VISIT  FOR MORE REFILLS) Patient taking differently: Take '1mg'$  at bedtime 05/31/16   Tammi Sou, MD  sertraline (ZOLOFT) 100 MG tablet TAKE 1 TABLET DAILY 06/26/16   Tammi Sou, MD  simvastatin (ZOCOR) 20 MG tablet TAKE 1 TABLET EVERY EVENING 03/28/16   Tammi Sou, MD  XARELTO 20 MG TABS tablet TAKE 1 TABLET DAILY WITH SUPPER 03/14/16   [name], MD   Dg Knee Complete 4 Views Right  Result Date: 09/23/2016 CLINICAL DATA:  Right knee pain and swelling for 3-4 days without known trauma. EXAM: RIGHT KNEE - COMPLETE 4+ VIEW COMPARISON:  None. FINDINGS: Minimal degenerative changes in the patellofemoral compartment. No joint effusion. No fracture or dislocation. IMPRESSION: Minimal degenerative changes in the patellofemoral compartment. Electronically Signed   By: Dorise Bullion III M.D   On: 09/23/2016 18:43   Dg Finger Ring Left  Result Date: 09/24/2016 CLINICAL DATA:  Left ring finger injury with skillsaw, laceration to anterior surface of hand distally, pt states finger is numb EXAM: LEFT RING FINGER 2+V COMPARISON:  Plain film of the left FINDINGS: There is a subtle lucency within the central portion of the tuft of the  second distal phalanx which may indicate nondisplaced fracture line but not convincing. No displaced fracture. Overlying soft tissues are unremarkable. IMPRESSION: Questionable nondisplaced fracture lucency within the central portion of the tuft of the second distal phalanx, but not convincing. No displaced fracture. Electronically Signed   By: Franki Cabot M.D.   On: 09/24/2016 16:27    Positive ROS: All other systems have been reviewed and were otherwise negative with the exception of those mentioned in the HPI and as above.  Physical Exam: Vitals: Refer to EMR. Constitutional:  WD, WN, NAD HEENT:  NCAT, EOMI Neuro/Psych:  Alert & oriented to person, place, and time; appropriate mood & affect Lymphatic: No generalized extremity edema or lymphadenopathy Extremities / MSK:  The extremities are normal with respect to appearance, ranges of motion, joint stability, muscle strength/tone, sensation, & perfusion except as otherwise noted:  Left ring finger has a laceration mostly transverse on the volar surface, overlying the middle phalanx.  The finger has acceptable capillary refill and is not cold with the tip.  There is bleeding from the distal margin of the wound.  There is a poor light touch sensibility on the radial and ulnar aspects of the digital tip to confrontation testing, intact flexion at the MP and PIP joints, no active flexion of the DIP joint.  Assessment: Left ring finger injury from a saw, with volar laceration, with likely accompanying FDP and possibly radial and ulnar digital nerve injuries  Plan: I discussed these findings with him.  He will receive antibiotic prophylaxis to continue on discharge.  The EDP performed digital block, copiously irrigated and closed the wound loosely.  Splint will be applied and analgesic plan developed.  I discussed with him the extent of the structures indicated, and the indication for delayed surgical exploration, with repair of structures as  indicated, possibly FDP and one or both digital nerves.  Consequences of painful neuroma discussed in case he were to decide not to reconstruct the nerves.  Additionally, the required rehabilitation following flexor tendon repair was also reviewed.  My office will call him tomorrow or Tuesday to arrange follow-up for Wednesday or Thursday, likely with planned surgical repair of structures as indicated on Friday or Monday the 12th.  Rayvon Char [place], [place]  [place], [place]  [id number] Office: [phone] Mobile: [phone]  09/24/2016, 5:32 PM

## 2016-09-24 NOTE — ED Provider Notes (Signed)
[facility] Provider Note   CSN: [id number] Arrival date & time: 09/24/16  1546  By signing my name below, I, Gwenlyn Fudge, attest that this documentation has been prepared under the direction and in the presence of Eliezer Mccoy, PA-C. Electronically Signed: Gwenlyn Fudge, ED Scribe. 09/24/16. 12:56 AM.  History   Chief Complaint Chief Complaint  Patient presents with  . Finger Injury   The history is provided by the patient. No language interpreter was used.   HPI Comments: Mason [name] is a 76 y.o. male who presents to the Emergency Department complaining of a linear laceration to the left ring finger s/p finger injury with skill saw just PTA. Pt is currently taking Xarelto. Bleeding is controlled with pressure banage. Patient reporting numbness to the tip of his finger. Pt reports other associated minor lacerations/abrasions to the left hand. He is right handed. Pt denies nausea, vomiting, diarrhea, abdominal pain, shortness of breath or any other complaints at this time.   Past Medical History:  Diagnosis Date  . Arthritis    left shoulder (Dr. [name]): steroid injection 04/03/16.  Plan for TSA surgery 06/2016.  [name] BPH (benign prostatic hypertrophy) 06/2011   Irritative sx's; pt declined trial of anticholinergic per Urology records  . Chronic combined systolic and diastolic heart failure ([place]) 05/31/2012   Nonischemic:  EF 40-45%, LA mod-severe dilated, AFIB.   02/2016 EF 40%, diffuse hypokinesis, grade 2 DD.  Myoc perf imaging showed EF 32% 04/2016.  Pt to get upgrade to CRT-D as of 08/2016.  [name] Chronic low back pain   . Chronic renal insufficiency, stage III (moderate) 2015   CrCl about 60 ml/min  . Depression   . Diabetes mellitus   . DOE (dyspnea on exertion)    NYHA class I CHF  . Episodic low back pain 01/22/2013   w/intermittent radiculitis (12/2014 his neurologist referred him to pain mgmt for epidural steroid injection)  . GERD (gastroesophageal reflux  disease)   . H/O tilt table evaluation 11/02/05   negative  . Helicobacter pylori gastritis 01/2016  . History of adenomatous polyp of colon 10/12/11   Dr. [name] (3 right side of colon- tubular adenomas removed)  . History of cardiovascular stress test 05/28/12   no ischemia, EF 37%, imaging results are unchanged and within normal variance  . History of chronic prostatitis   . History of retinal detachment    OS  . History of vertigo    + Hx of posterior HA's.  Neuro (Dr. Erling Cruz) eval 2011.  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  . Hypertension   . Lumbar spondylosis    lumbosacral radiculopathy at L4 by EMG testing, right foot drop (neurologist is Dr. Linus Salmons with [facility] in [place])--neurologist referred him to neurosurgery  . Nephrolithiasis 07/2012   Left UVJ 2 mm stone with dilation of renal collecting system and slight hydroureter on right  . 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  Since no CAD on cath 05/2016, cards recommends upgrade to CRT-D.  [name] Permanent atrial fibrillation ([place])    DCCV 07/09/13-converted, lasted two days, then back into afib--needs lifetime anticoagulation (Xarelto as of 09/2014)    Patient Active Problem List   Diagnosis Date Noted  . Chronic systolic heart failure ([place]) 08/04/2016  . Chronic renal insufficiency, stage II (mild) 12/09/2015  . Uncontrolled hypertension 10/11/2015  . Permanent atrial fibrillation ([place]) 06/22/2015  .  Diabetes mellitus with complication ([place]) 93/71/6967  . Cellulitis of finger of left hand 11/20/2014  . Swollen R ankle 05/25/2014  . Leg fatigue 02/15/2014  . Lumbar back pain with radiculopathy affecting left lower extremity 02/15/2014  . Other malaise and fatigue 02/05/2014  . Cervical muscle strain 11/27/2013  . Osteoarthritis of right knee 10/14/2013  . HTN (hypertension),  benign 05/26/2013  . Acute low back pain with radicular symptoms, duration less than 6 weeks 05/12/2013  . Hyperlipidemia 04/21/2013  . DM2 (diabetes mellitus, type 2) ([place]) 04/21/2013  . Atrial fibrillation, permanent 03/08/2013  . Left ventricular dysfunction 05/31/2012  . Near syncope 02/06/2012  . AVB (atrioventricular block) 02/06/2012  . Cardiac pacemaker 02/05/12 02/06/2012  . Dyslipidemia 02/06/2012    Past Surgical History:  Procedure Laterality Date  . CARDIAC CATHETERIZATION N/A 06/14/2016   Minimal nonobstructive dz, EF 25-35%.  Procedure: Left Heart Cath and Coronary Angiography;  Surgeon: Peter M Martinique, MD;  Location: [facility];  Service: Cardiovascular;  Laterality: N/A;  . CARDIOVASCULAR STRESS TEST  2012   2012 nuclear perfusion study: low risk scan; 04/2016 normal myocardial perfusion imaging, EF 32%.  [name] CARDIOVERSION  07/09/2012   Procedure: CARDIOVERSION;  Surgeon: [name], MD;  Location: [facility] ENDOSCOPY;  Service: Cardiovascular;  Laterality: N/A;  . COLONOSCOPY W/ POLYPECTOMY  approx 2006; repeated 09/2011   Polyps on 2013 EGD as well, repeat 12/2014  . ESOPHAGOGASTRODUODENOSCOPY  10/18/06   Done due to chronic GERD: Normal, bx showed no barrett's esophagus (Dr. [name])  . EYE SURGERY     retinal detachment OS+ cataract surgery  . INSERT / REPLACE / REMOVE PACEMAKER  02/05/2012   dual chamber, sinus node dysfunction, sinus arrest, PAF, Medtronic Revo serial#-[id number] H: last checked 05/2015  . LUMBAR LAMINECTOMY  1976   left L4-5  . PERMANENT PACEMAKER INSERTION N/A 02/05/2012   Procedure: PERMANENT PACEMAKER INSERTION;  Surgeon: [name], MD; Generator Medtronic [place] model [place] serial number [id number] H Laterality: N/A;  . SHOULDER SURGERY Left 2018   Left shoulder reverse TSA [name] [place]  . TRANSTHORACIC ECHOCARDIOGRAM  08/25/10; 05/2012; 03/23/16   mild asymmetric LVH, normal systolic function, normal diastolic fxn, mild-to-mod  mitral regurg, mild aortic valve sclerosis and trace AI, mild aortic root dilatation. 2014 f/u showed EF 40-45%, mod LAE, A FIB.  02/2016 EF 40%, diffuse hypokinesis, grade 2 DD.       Home Medications    Prior to Admission medications   Medication Sig Start Date End Date Taking? Authorizing Provider  blood glucose meter kit and supplies KIT Use to check blood sugar 1 to 2 times daily 07/06/16   Tammi Sou, MD  cephALEXin (KEFLEX) 500 MG capsule Take 1 capsule (500 mg total) by mouth 4 (four) times daily. 09/24/16   Frederica Kuster, PA-C  colchicine 0.6 MG tablet Take 0.6 mg by mouth as needed. 08/20/15   Historical Provider, MD  COMBIGAN 0.2-0.5 % ophthalmic solution Place 1 drop into both eyes 2 (two) times daily.  04/19/16   Historical Provider, MD  gabapentin (NEURONTIN) 300 MG capsule TAKE 1 CAPSULE THREE TIMES A DAY 10/20/15   Tammi Sou, MD  GLIPIZIDE XL 5 MG 24 hr tablet TAKE 1 TABLET EVERY MORNING 03/28/16   Tammi Sou, MD  glucose blood test strip Use to check blood sugars 1-2 times daily 06/28/16   Tammi Sou, MD  JANUVIA 100 MG tablet TAKE 1 TABLET DAILY 03/31/16   Tammi Sou,  MD  Lancets (PRECISION ULTRA LANCET) MISC Use as instructed 11/12/14   Tammi Sou, MD  latanoprost (XALATAN) 0.005 % ophthalmic solution Place 1 drop into both eyes at bedtime.  04/24/15   Historical Provider, MD  losartan (COZAAR) 100 MG tablet Take 100 mg by mouth daily.    Historical Provider, MD  metFORMIN (GLUCOPHAGE) 1000 MG tablet Take 1,000 mg by mouth 2 (two) times daily with a meal.  03/28/16   Historical Provider, MD  metoprolol succinate (TOPROL-XL) 100 MG 24 hr tablet 1 and 1/2 tabs po qd 12/09/15   Tammi Sou, MD  omeprazole (PRILOSEC) 40 MG capsule Take 1 capsule (40 mg total) by mouth daily. Patient taking differently: Take 40 mg by mouth daily as needed (indigestion).  12/14/15   Tammi Sou, MD  ONE TOUCH LANCETS MISC Use to check blood sugar 1-2 times daily  06/28/16   Tammi Sou, MD  predniSONE (DELTASONE) 20 MG tablet 2 tabs po qd x 5d 09/22/16   Tammi Sou, MD  rOPINIRole (REQUIP) 1 MG tablet TAKE 1 TABLET DAILY (NEED OFFICE VISIT FOR MORE REFILLS) Patient taking differently: Take 56m at bedtime 05/31/16   PTammi Sou MD  sertraline (ZOLOFT) 100 MG tablet TAKE 1 TABLET DAILY 06/26/16   PTammi Sou MD  simvastatin (ZOCOR) 20 MG tablet TAKE 1 TABLET EVERY EVENING 03/28/16   PTammi Sou MD  XARELTO 20 MG TABS tablet TAKE 1 TABLET DAILY WITH SUPPER 03/14/16   MSanda Klein MD    Family History Family History  Problem Relation Age of Onset  . Heart failure Mother   . Stroke Mother   . Stroke Father     Social History Social History  Substance Use Topics  . Smoking status: Never Smoker  . Smokeless tobacco: Never Used  . Alcohol use 7.2 oz/week    12 Cans of beer per week     Comment: 12 beers per week     Allergies   Patient has no known allergies.   Review of Systems Review of Systems  Constitutional: Negative for chills and fever.  HENT: Negative for facial swelling and sore throat.   Respiratory: Negative for shortness of breath.   Cardiovascular: Negative for chest pain.  Gastrointestinal: Negative for abdominal pain, diarrhea, nausea and vomiting.  Genitourinary: Negative for dysuria.  Musculoskeletal: Negative for back pain.  Skin: Positive for wound. Negative for rash.  Neurological: Negative for numbness and headaches.  Psychiatric/Behavioral: The patient is not nervous/anxious.      Physical Exam Updated Vital Signs BP 142/86 (BP Location: Left Arm)   Pulse 68   Temp 97.7 F (36.5 C) (Oral)   Resp 16   Ht 5' 11"  (1.803 m)   Wt 102.1 kg   SpO2 97%   BMI 31.38 kg/m   Physical Exam  Constitutional: He appears well-developed and well-nourished. No distress.  HENT:  Head: Normocephalic and atraumatic.  Mouth/Throat: Oropharynx is clear and moist. No oropharyngeal exudate.  Eyes:  Conjunctivae are normal. Pupils are equal, round, and reactive to light. Right eye exhibits no discharge. Left eye exhibits no discharge. No scleral icterus.  Neck: Normal range of motion. Neck supple. No thyromegaly present.  Cardiovascular: Normal rate, regular rhythm, normal heart sounds and intact distal pulses.  Exam reveals no gallop and no friction rub.   No murmur heard. Pulmonary/Chest: Effort normal and breath sounds normal. No stridor. No respiratory distress. He has no wheezes. He has no rales.  Abdominal: Soft. Bowel sounds are normal. He exhibits no distension. There is no tenderness. There is no rebound and no guarding.  Musculoskeletal: He exhibits no edema.  L 4th digit: See photo for wound to palmar surface; Extension intact, however no flexion at DIP; flexion and intact at PIP; patient reported complete numbness to distal fingertip and pad; abduction/abduction intact; cap refill < 2secs 2 other superficial abrasions to fifth digit and second digit  Lymphadenopathy:    He has no cervical adenopathy.  Neurological: He is alert. Coordination normal.  Skin: Skin is warm and dry. No rash noted. He is not diaphoretic. No pallor.  Psychiatric: He has a normal mood and affect.  Nursing note and vitals reviewed.      ED Treatments / Results  DIAGNOSTIC STUDIES: Oxygen Saturation is 96% on RA, normal by my interpretation.    COORDINATION OF CARE: 4:45 PM Discussed treatment plan with pt at bedside which includes consult with hand surgeon and pt agreed to plan.  Labs (all labs ordered are listed, but only abnormal results are displayed) Labs Reviewed - No data to display  EKG  EKG Interpretation None       Radiology  Dg Knee Complete 4 Views Right  Result Date: 09/23/2016 CLINICAL DATA:  Right knee pain and swelling for 3-4 days without known trauma. EXAM: RIGHT KNEE - COMPLETE 4+ VIEW COMPARISON:  None. FINDINGS: Minimal degenerative changes in the patellofemoral  compartment. No joint effusion. No fracture or dislocation. IMPRESSION: Minimal degenerative changes in the patellofemoral compartment. Electronically Signed   By: Dorise Bullion III M.D   On: 09/23/2016 18:43   Dg Finger Ring Left  Result Date: 09/24/2016 CLINICAL DATA:  Left ring finger injury with skillsaw, laceration to anterior surface of hand distally, pt states finger is numb EXAM: LEFT RING FINGER 2+V COMPARISON:  Plain film of the left FINDINGS: There is a subtle lucency within the central portion of the tuft of the second distal phalanx which may indicate nondisplaced fracture line but not convincing. No displaced fracture. Overlying soft tissues are unremarkable. IMPRESSION: Questionable nondisplaced fracture lucency within the central portion of the tuft of the second distal phalanx, but not convincing. No displaced fracture. Electronically Signed   By: Franki Cabot M.D.   On: 09/24/2016 16:27    Procedures Procedures (including critical care time)  LACERATION REPAIR Performed by: Frederica Kuster Authorized by: Frederica Kuster Consent: Verbal consent obtained. Risks and benefits: risks, benefits and alternatives were discussed Consent given by: patient Patient identity confirmed: provided demographic data Prepped and Draped in normal sterile fashion Wound explored  Laceration Location: L ring finger  Laceration Length: 2cm  No Foreign Bodies seen or palpated  Anesthesia: digital block and local infiltration  Local anesthetic: lidocaine 1% w/o epinephrine  Anesthetic total: 7 ml  Irrigation method: syringe Amount of cleaning: standard  Skin closure: 4-0 Prolene  Number of sutures: 4  Technique: simple interuppted   Patient tolerance: Patient tolerated the procedure well with no immediate complications.  Wound dressed with nonadherent, vaseline sterile dressing and gauze.   Medications Ordered in ED Medications  lidocaine (PF) (XYLOCAINE) 1 % injection 30 mL  (10 mLs Infiltration Given by Other 09/24/16 1817)  cefTRIAXone (ROCEPHIN) 1 g in dextrose 5 % 50 mL IVPB (0 g Intravenous Stopped 09/24/16 1838)     Initial Impression / Assessment and Plan / ED Course  I have reviewed the triage vital signs and the nursing notes.  Pertinent labs &  imaging results that were available during my care of the patient were reviewed by me and considered in my medical decision making (see chart for details).     Tetanus UTD. Laceration occurred < 12 hours prior to repair. Patient with tendon injury. X-ray shows questionable nondisplaced fracture lucency within the central portion of the tuft of the second distal phalanx, but not convincing; no displaced fracture. Doubt there is fracture considering location of laceration. I discussed patient case was Dr. Grandville Silos who came to evaluate the patient. Dr. Grandville Silos advised I place a few sutures across the wound and he will take him to surgery at the end of the week. Dr. Grandville Silos will see the patient in the office this week prior to surgery. Dr. Grandville Silos advised patient to stop throughout so after tomorrow night's dose. Patient given Rocephin dose in the ED. Discharge home with Keflex. Return precautions discussed. Patient understands and agrees with plan. Patient vitals stable throughout ED course and discharged in satisfactory condition. Patient also evaluated by Dr. Ashok Cordia who guided the patient's management and agrees with plan.   Final Clinical Impressions(s) / ED Diagnoses   Final diagnoses:  Laceration of left ring finger without foreign body without damage to nail, initial encounter    New Prescriptions Discharge Medication List as of 09/24/2016  7:00 PM    START taking these medications   Details  cephALEXin (KEFLEX) 500 MG capsule Take 1 capsule (500 mg total) by mouth 4 (four) times daily., Starting Sun 09/24/2016, Print      I personally performed the services described in this documentation, which was scribed in  my presence. The recorded information has been reviewed and is accurate.    Frederica Kuster, PA-C 09/25/16 0100    Lajean Saver, MD 09/25/16 1535

## 2016-09-26 DIAGNOSIS — M25512 Pain in left shoulder: Secondary | ICD-10-CM | POA: Diagnosis not present

## 2016-09-26 DIAGNOSIS — M19012 Primary osteoarthritis, left shoulder: Secondary | ICD-10-CM | POA: Diagnosis not present

## 2016-09-27 DIAGNOSIS — S66125A Laceration of flexor muscle, fascia and tendon of left ring finger at wrist and hand level, initial encounter: Secondary | ICD-10-CM | POA: Diagnosis not present

## 2016-09-27 DIAGNOSIS — S64495A Injury of digital nerve of left ring finger, initial encounter: Secondary | ICD-10-CM | POA: Diagnosis not present

## 2016-09-27 DIAGNOSIS — S61215A Laceration without foreign body of left ring finger without damage to nail, initial encounter: Secondary | ICD-10-CM | POA: Diagnosis not present

## 2016-09-28 ENCOUNTER — Other Ambulatory Visit: Payer: Self-pay | Admitting: Orthopedic Surgery

## 2016-09-28 ENCOUNTER — Other Ambulatory Visit: Payer: Self-pay | Admitting: *Deleted

## 2016-09-28 DIAGNOSIS — M19012 Primary osteoarthritis, left shoulder: Secondary | ICD-10-CM | POA: Diagnosis not present

## 2016-09-28 DIAGNOSIS — M25512 Pain in left shoulder: Secondary | ICD-10-CM | POA: Diagnosis not present

## 2016-09-28 MED ORDER — ENTRESTO 97-103 MG PO TABS: 1.0000 | ORAL_TABLET | Freq: Two times a day (BID) | ORAL | 1 refills | Status: DC

## 2016-09-28 NOTE — Telephone Encounter (Signed)
Rx(s) sent to pharmacy electronically.  

## 2016-09-28 NOTE — Telephone Encounter (Signed)
Patient left a msg on the refill vm requesting a refill on entresto. [facility] Patient can be reached at [phone]. Thanks, MI

## 2016-09-29 ENCOUNTER — Encounter (HOSPITAL_COMMUNITY): Payer: Self-pay | Admitting: *Deleted

## 2016-09-29 NOTE — H&P (Signed)
Mason [name] is an 76 y.o. male.   CC / Reason for Visit: Left ring finger injury HPI: This patient presents for reevaluation of his left ring finger, having injured it with a circular saw on the date above.  He was suspected to have a flexor tendon and digital nerve injury and presents today for wound check and confirmation.  He hasn't had much in the way of pain, and has been taking his antibiotic  Past Medical History:  Diagnosis Date  . Arthritis    left shoulder (Dr. [name]): steroid injection 04/03/16.  Plan for TSA surgery 06/2016.  [name] BPH (benign prostatic hypertrophy) 06/2011   Irritative sx's; pt declined trial of anticholinergic per Urology records  . CHF (congestive heart failure) ([place])   . Chronic combined systolic and diastolic heart failure ([place]) 05/31/2012   Nonischemic:  EF 40-45%, LA mod-severe dilated, AFIB.   02/2016 EF 40%, diffuse hypokinesis, grade 2 DD.  Myoc perf imaging showed EF 32% 04/2016.  Pt to get upgrade to CRT-D as of 08/2016.  [name] Chronic low back pain   . Chronic renal insufficiency, stage III (moderate) 2015   CrCl about 60 ml/min  . Depression   . Diabetes mellitus    Type II  . DOE (dyspnea on exertion)    NYHA class I CHF  . Dyspnea    with exertion  . Episodic low back pain 01/22/2013   w/intermittent radiculitis (12/2014 his neurologist referred him to pain mgmt for epidural steroid injection)  . GERD (gastroesophageal reflux disease)   . H/O tilt table evaluation 11/02/05   negative  . Helicobacter pylori gastritis 01/2016  . History of adenomatous polyp of colon 10/12/11   Dr. [name] (3 right side of colon- tubular adenomas removed)  . History of cardiovascular stress test 05/28/12   no ischemia, EF 37%, imaging results are unchanged and within normal variance  . History of chronic prostatitis   . History of retinal detachment    OS  . History of vertigo    + Hx of posterior HA's.  Neuro (Dr. Erling Cruz) eval 2011.  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  Congenitally small posterior circulation.  . Hypertension   . Lumbar spondylosis    lumbosacral radiculopathy at L4 by EMG testing, right foot drop (neurologist is Dr. Linus Salmons with [facility] in [place])--neurologist referred him to neurosurgery  . Nephrolithiasis 07/2012   Left UVJ 2 mm stone with dilation of renal collecting system and slight hydroureter on right  . Neuropathy ([place])   . 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  Since no CAD on cath 05/2016, cards recommends upgrade to CRT-D.  [name] Permanent atrial fibrillation ([place])    DCCV 07/09/13-converted, lasted two days, then back into afib--needs lifetime anticoagulation (Xarelto as of 09/2014)    Past Surgical History:  Procedure Laterality Date  . CARDIAC CATHETERIZATION N/A 06/14/2016   Minimal nonobstructive dz, EF 25-35%.  Procedure: Left Heart Cath and Coronary Angiography;  Surgeon: Peter M Martinique, MD;  Location: [facility] CV LAB;  Service: Cardiovascular;  Laterality: N/A;  . CARDIOVASCULAR STRESS TEST  2012   2012 nuclear perfusion study: low risk scan; 04/2016 normal myocardial perfusion imaging, EF 32%.  [name] CARDIOVERSION  07/09/2012   Procedure: CARDIOVERSION;  Surgeon: [name], MD;  Location: [facility] ENDOSCOPY;  Service: Cardiovascular;  Laterality: N/A;  . CATARACT EXTRACTION Bilateral   . COLONOSCOPY W/ POLYPECTOMY  approx  2006; repeated 09/2011   Polyps on 2013 EGD as well, repeat 12/2014  . ESOPHAGOGASTRODUODENOSCOPY  10/18/06   Done due to chronic GERD: Normal, bx showed no barrett's esophagus (Dr. [name])  . EYE SURGERY     retinal detachment OS  . INSERT / REPLACE / REMOVE PACEMAKER  02/05/2012   dual chamber, sinus node dysfunction, sinus arrest, PAF, Medtronic Revo serial#-[id number] H: last checked 05/2015  . LUMBAR LAMINECTOMY  1976   left L4-5  . PERMANENT PACEMAKER INSERTION N/A 02/05/2012    Procedure: PERMANENT PACEMAKER INSERTION;  Surgeon: [name], MD; Generator Medtronic [place] model [place] serial number [id number] H Laterality: N/A;  . SHOULDER SURGERY Left 2018   Left shoulder reverse TSA [name] [place]  . TRANSTHORACIC ECHOCARDIOGRAM  08/25/10; 05/2012; 03/23/16   mild asymmetric LVH, normal systolic function, normal diastolic fxn, mild-to-mod mitral regurg, mild aortic valve sclerosis and trace AI, mild aortic root dilatation. 2014 f/u showed EF 40-45%, mod LAE, A FIB.  02/2016 EF 40%, diffuse hypokinesis, grade 2 DD.    Family History  Problem Relation Age of Onset  . Heart failure Mother   . Stroke Mother   . Stroke Father    Social History:  reports that he has never smoked. He has never used smokeless tobacco. He reports that he drinks about 3.6 oz of alcohol per week . He reports that he does not use drugs.  Allergies: No Known Allergies  No prescriptions prior to admission.    No results found for this or any previous visit (from the past 48 hour(s)). No results found.  Review of Systems  All other systems reviewed and are negative.   There were no vitals taken for this visit. Physical Exam  Constitutional:  WD, WN, NAD HEENT:  NCAT, EOMI Neuro/Psych:  Alert & oriented to person, place, and time; appropriate mood & affect Lymphatic: No generalized UE edema or lymphadenopathy Extremities / MSK:  Both UE are normal with respect to appearance, ranges of motion, joint stability, muscle strength/tone, sensation, & perfusion except as otherwise noted:  Left ring finger transverse wound over P2, with some bruising about it.  The digit tends to rest in a extended posture, has FDS function, no FDP function, and 2 point discrimination greater than 15 radially and ulnarly.  Labs / Xrays:  No radiographic studies obtained today.  Assessment:  Left ring finger laceration with suspected laceration of FDP and both radial and ulnar digital  nerves  Plan: I discussed these findings with him today.  I reviewed the surgical plan that included exploration of the wound with elongation, likely repair of FDP in zone 2, as well as repair of both digital nerves, possibly with conduit, depending upon the size of the defect.  We are planning to proceed on Friday.  The details of the operative procedure were discussed with the patient.  Questions were invited and answered.  In addition to the goal of the procedure, the risks of the procedure to include but not limited to bleeding; infection; damage to the nerves or blood vessels that could result in bleeding, numbness, weakness, chronic pain, and the need for additional procedures; stiffness; the need for revision surgery; and anesthetic risks were reviewed.  No specific outcome was guaranteed or implied.  Informed consent was obtained.  In addition, his initial postoperative therapy was arranged today as well, aiming for 3-5 days postop.  Zareya Tuckett A., MD 09/29/2016, 4:06 PM

## 2016-09-29 NOTE — Progress Notes (Signed)
I instructed patient to check CBG to check CBG and if it is less than 70 to treat it with Glucose Gel, Glucose tablets or 1/2 cup of clear juice like apple juice or cranberry juice, or 1/2 cup of regular soda. (not cream soda). I instructed patient to recheck CBG in 15 minutes and if CBG is not greater than 70, to  Call [phone] (pre- op). If it is before pre-op opens to retreat as before and recheck CBG in 15 minutes. I told patient to make note of time that liquid is taken and amount, that surgical time may have to be adjusted.  

## 2016-10-02 ENCOUNTER — Ambulatory Visit: Payer: Medicare Other | Admitting: Family Medicine

## 2016-10-02 ENCOUNTER — Ambulatory Visit (HOSPITAL_COMMUNITY): Payer: Medicare Other | Admitting: Certified Registered Nurse Anesthetist

## 2016-10-02 ENCOUNTER — Ambulatory Visit (HOSPITAL_COMMUNITY)
Admission: RE | Admit: 2016-10-02 | Discharge: 2016-10-02 | Disposition: A | Discharge status: Home / Self Care | Payer: Medicare Other | Source: Ambulatory Visit | Attending: Orthopedic Surgery | Admitting: Orthopedic Surgery

## 2016-10-02 ENCOUNTER — Encounter (HOSPITAL_COMMUNITY)
Admission: RE | Disposition: A | Discharge status: Home / Self Care | Payer: Self-pay | Source: Ambulatory Visit | Attending: Orthopedic Surgery

## 2016-10-02 ENCOUNTER — Encounter (HOSPITAL_COMMUNITY): Payer: Self-pay | Admitting: *Deleted

## 2016-10-02 DIAGNOSIS — Z9841 Cataract extraction status, right eye: Secondary | ICD-10-CM | POA: Insufficient documentation

## 2016-10-02 DIAGNOSIS — I130 Hypertensive heart and chronic kidney disease with heart failure and stage 1 through stage 4 chronic kidney disease, or unspecified chronic kidney disease: Secondary | ICD-10-CM | POA: Diagnosis not present

## 2016-10-02 DIAGNOSIS — M479 Spondylosis, unspecified: Secondary | ICD-10-CM | POA: Diagnosis not present

## 2016-10-02 DIAGNOSIS — N411 Chronic prostatitis: Secondary | ICD-10-CM | POA: Insufficient documentation

## 2016-10-02 DIAGNOSIS — E1122 Type 2 diabetes mellitus with diabetic chronic kidney disease: Secondary | ICD-10-CM | POA: Diagnosis not present

## 2016-10-02 DIAGNOSIS — G8929 Other chronic pain: Secondary | ICD-10-CM | POA: Insufficient documentation

## 2016-10-02 DIAGNOSIS — Z8619 Personal history of other infectious and parasitic diseases: Secondary | ICD-10-CM | POA: Insufficient documentation

## 2016-10-02 DIAGNOSIS — S66124D Laceration of flexor muscle, fascia and tendon of right ring finger at wrist and hand level, subsequent encounter: Secondary | ICD-10-CM | POA: Diagnosis present

## 2016-10-02 DIAGNOSIS — S64494A Injury of digital nerve of right ring finger, initial encounter: Secondary | ICD-10-CM | POA: Diagnosis not present

## 2016-10-02 DIAGNOSIS — Z86010 Personal history of colonic polyps: Secondary | ICD-10-CM | POA: Diagnosis not present

## 2016-10-02 DIAGNOSIS — F329 Major depressive disorder, single episode, unspecified: Secondary | ICD-10-CM | POA: Diagnosis not present

## 2016-10-02 DIAGNOSIS — R0600 Dyspnea, unspecified: Secondary | ICD-10-CM | POA: Insufficient documentation

## 2016-10-02 DIAGNOSIS — S6422XA Injury of radial nerve at wrist and hand level of left arm, initial encounter: Secondary | ICD-10-CM | POA: Diagnosis not present

## 2016-10-02 DIAGNOSIS — N182 Chronic kidney disease, stage 2 (mild): Secondary | ICD-10-CM | POA: Diagnosis not present

## 2016-10-02 DIAGNOSIS — W312XXA Contact with powered woodworking and forming machines, initial encounter: Secondary | ICD-10-CM | POA: Insufficient documentation

## 2016-10-02 DIAGNOSIS — M19012 Primary osteoarthritis, left shoulder: Secondary | ICD-10-CM | POA: Diagnosis not present

## 2016-10-02 DIAGNOSIS — K219 Gastro-esophageal reflux disease without esophagitis: Secondary | ICD-10-CM | POA: Insufficient documentation

## 2016-10-02 DIAGNOSIS — S66125A Laceration of flexor muscle, fascia and tendon of left ring finger at wrist and hand level, initial encounter: Secondary | ICD-10-CM | POA: Diagnosis not present

## 2016-10-02 DIAGNOSIS — E1140 Type 2 diabetes mellitus with diabetic neuropathy, unspecified: Secondary | ICD-10-CM | POA: Diagnosis not present

## 2016-10-02 DIAGNOSIS — Z8249 Family history of ischemic heart disease and other diseases of the circulatory system: Secondary | ICD-10-CM | POA: Insufficient documentation

## 2016-10-02 DIAGNOSIS — M545 Low back pain: Secondary | ICD-10-CM | POA: Insufficient documentation

## 2016-10-02 DIAGNOSIS — S61215A Laceration without foreign body of left ring finger without damage to nail, initial encounter: Secondary | ICD-10-CM | POA: Diagnosis not present

## 2016-10-02 DIAGNOSIS — N400 Enlarged prostate without lower urinary tract symptoms: Secondary | ICD-10-CM | POA: Insufficient documentation

## 2016-10-02 DIAGNOSIS — S6402XA Injury of ulnar nerve at wrist and hand level of left arm, initial encounter: Secondary | ICD-10-CM | POA: Diagnosis not present

## 2016-10-02 DIAGNOSIS — Z950 Presence of cardiac pacemaker: Secondary | ICD-10-CM | POA: Diagnosis not present

## 2016-10-02 DIAGNOSIS — Y939 Activity, unspecified: Secondary | ICD-10-CM | POA: Diagnosis not present

## 2016-10-02 DIAGNOSIS — R42 Dizziness and giddiness: Secondary | ICD-10-CM | POA: Insufficient documentation

## 2016-10-02 DIAGNOSIS — N183 Chronic kidney disease, stage 3 (moderate): Secondary | ICD-10-CM | POA: Insufficient documentation

## 2016-10-02 DIAGNOSIS — Z823 Family history of stroke: Secondary | ICD-10-CM | POA: Insufficient documentation

## 2016-10-02 DIAGNOSIS — M1990 Unspecified osteoarthritis, unspecified site: Secondary | ICD-10-CM | POA: Insufficient documentation

## 2016-10-02 DIAGNOSIS — Z9842 Cataract extraction status, left eye: Secondary | ICD-10-CM | POA: Diagnosis not present

## 2016-10-02 DIAGNOSIS — I129 Hypertensive chronic kidney disease with stage 1 through stage 4 chronic kidney disease, or unspecified chronic kidney disease: Secondary | ICD-10-CM | POA: Diagnosis not present

## 2016-10-02 DIAGNOSIS — I482 Chronic atrial fibrillation: Secondary | ICD-10-CM | POA: Diagnosis not present

## 2016-10-02 DIAGNOSIS — I5042 Chronic combined systolic (congestive) and diastolic (congestive) heart failure: Secondary | ICD-10-CM | POA: Insufficient documentation

## 2016-10-02 DIAGNOSIS — Z87442 Personal history of urinary calculi: Secondary | ICD-10-CM | POA: Insufficient documentation

## 2016-10-02 DIAGNOSIS — S66124A Laceration of flexor muscle, fascia and tendon of right ring finger at wrist and hand level, initial encounter: Secondary | ICD-10-CM | POA: Diagnosis not present

## 2016-10-02 DIAGNOSIS — I428 Other cardiomyopathies: Secondary | ICD-10-CM

## 2016-10-02 HISTORY — DX: Heart failure, unspecified: I50.9

## 2016-10-02 HISTORY — PX: FLEXOR TENDON REPAIR: SHX6501

## 2016-10-02 HISTORY — DX: Polyneuropathy, unspecified: G62.9

## 2016-10-02 LAB — GLUCOSE, CAPILLARY
Glucose-Capillary: 168 mg/dL — ABNORMAL HIGH (ref 65–99)
Glucose-Capillary: 173 mg/dL — ABNORMAL HIGH (ref 65–99)
Glucose-Capillary: 145 mg/dL — ABNORMAL HIGH (ref 65–99)

## 2016-10-02 LAB — BASIC METABOLIC PANEL
Anion gap: 10 (ref 5–15)
BUN: 16 mg/dL (ref 6–20)
CO2: 24 mmol/L (ref 22–32)
Calcium: 8.7 mg/dL — ABNORMAL LOW (ref 8.9–10.3)
Chloride: 106 mmol/L (ref 101–111)
Creatinine, Ser: 1.14 mg/dL (ref 0.61–1.24)
GFR calc Af Amer: >60 mL/min (ref >60)
GFR calc non Af Amer: >60 mL/min (ref >60)
Glucose, Bld: 187 mg/dL — ABNORMAL HIGH (ref 65–99)
Potassium: 4 mmol/L (ref 3.5–5.1)
Sodium: 140 mmol/L (ref 135–145)

## 2016-10-02 LAB — CBC
HCT: 41.4 % (ref 39.0–52.0)
Hemoglobin: 13.6 g/dL (ref 13.0–17.0)
MCH: 28.3 pg (ref 26.0–34.0)
MCHC: 32.9 g/dL (ref 30.0–36.0)
MCV: 86.3 fL (ref 78.0–100.0)
Platelets: 169 10*3/uL (ref 150–400)
RBC: 4.8 MIL/uL (ref 4.22–5.81)
RDW: 13.1 % (ref 11.5–15.5)
WBC: 4.4 10*3/uL (ref 4.0–10.5)

## 2016-10-02 LAB — PROTIME-INR
INR: 1.04
Prothrombin Time: 13.6 seconds (ref 11.4–15.2)

## 2016-10-02 SURGERY — REPAIR, TENDON, FLEXOR
Anesthesia: General | Site: Finger | Laterality: Left

## 2016-10-02 MED ORDER — EPINEPHRINE PF 1 MG/ML IJ SOLN
INTRAMUSCULAR | Status: AC
Start: 2016-10-02 — End: 2016-10-02
  Filled 2016-10-02: qty 1

## 2016-10-02 MED ORDER — RIVAROXABAN 20 MG PO TABS: ORAL_TABLET | ORAL | 1 refills | Status: DC

## 2016-10-02 MED ORDER — ACETAMINOPHEN 325 MG PO TABS: 650.0000 mg | ORAL_TABLET | Freq: Four times a day (QID) | ORAL | Status: DC | PRN

## 2016-10-02 MED ORDER — LIDOCAINE HCL (PF) 1 % IJ SOLN
INTRAMUSCULAR | Status: AC
  Filled 2016-10-02: qty 30

## 2016-10-02 MED ORDER — OXYCODONE HCL 5 MG PO TABS: 5.0000 mg | ORAL_TABLET | Freq: Four times a day (QID) | ORAL | 0 refills | Status: DC | PRN

## 2016-10-02 MED ORDER — 0.9 % SODIUM CHLORIDE (POUR BTL) OPTIME
TOPICAL | Status: DC | PRN
  Administered 2016-10-02: 1000 mL

## 2016-10-02 MED ORDER — MIDAZOLAM HCL 5 MG/5ML IJ SOLN
INTRAMUSCULAR | Status: DC | PRN
  Administered 2016-10-02: 1 mg via INTRAVENOUS

## 2016-10-02 MED ORDER — BUPIVACAINE HCL (PF) 0.5 % IJ SOLN
INTRAMUSCULAR | Status: DC | PRN
  Administered 2016-10-02: 10 mL

## 2016-10-02 MED ORDER — FENTANYL CITRATE (PF) 100 MCG/2ML IJ SOLN
INTRAMUSCULAR | Status: AC
  Filled 2016-10-02: qty 2

## 2016-10-02 MED ORDER — METOPROLOL SUCCINATE ER 100 MG PO TB24
100.0000 mg | ORAL_TABLET | ORAL | Status: AC
  Administered 2016-10-02: 100 mg via ORAL
  Filled 2016-10-02: qty 1

## 2016-10-02 MED ORDER — FENTANYL CITRATE (PF) 100 MCG/2ML IJ SOLN
INTRAMUSCULAR | Status: DC | PRN
  Administered 2016-10-02: 25 ug via INTRAVENOUS
  Administered 2016-10-02: 50 ug via INTRAVENOUS
  Administered 2016-10-02: 25 ug via INTRAVENOUS

## 2016-10-02 MED ORDER — ONDANSETRON HCL 4 MG/2ML IJ SOLN
INTRAMUSCULAR | Status: AC
  Filled 2016-10-02: qty 2

## 2016-10-02 MED ORDER — MIDAZOLAM HCL 2 MG/2ML IJ SOLN
INTRAMUSCULAR | Status: AC
  Filled 2016-10-02: qty 2

## 2016-10-02 MED ORDER — PHENYLEPHRINE 40 MCG/ML (10ML) SYRINGE FOR IV PUSH (FOR BLOOD PRESSURE SUPPORT)
PREFILLED_SYRINGE | INTRAVENOUS | Status: AC
  Filled 2016-10-02: qty 10

## 2016-10-02 MED ORDER — ONDANSETRON HCL 4 MG/2ML IJ SOLN
INTRAMUSCULAR | Status: DC | PRN
  Administered 2016-10-02: 4 mg via INTRAVENOUS

## 2016-10-02 MED ORDER — BUPIVACAINE HCL (PF) 0.5 % IJ SOLN
INTRAMUSCULAR | Status: AC
  Filled 2016-10-02: qty 30

## 2016-10-02 MED ORDER — PHENYLEPHRINE HCL 10 MG/ML IJ SOLN
INTRAMUSCULAR | Status: DC | PRN
  Administered 2016-10-02: 40 ug via INTRAVENOUS
  Administered 2016-10-02: 80 ug via INTRAVENOUS
  Administered 2016-10-02: 40 ug via INTRAVENOUS

## 2016-10-02 MED ORDER — FENTANYL CITRATE (PF) 100 MCG/2ML IJ SOLN
25.0000 ug | INTRAMUSCULAR | Status: DC | PRN
Start: 2016-10-02 — End: 2016-10-02

## 2016-10-02 MED ORDER — LIDOCAINE 2% (20 MG/ML) 5 ML SYRINGE
INTRAMUSCULAR | Status: DC | PRN
  Administered 2016-10-02: 60 mg via INTRAVENOUS

## 2016-10-02 MED ORDER — PROPOFOL 10 MG/ML IV BOLUS
INTRAVENOUS | Status: DC | PRN
  Administered 2016-10-02: 120 mg via INTRAVENOUS

## 2016-10-02 MED ORDER — LIDOCAINE 2% (20 MG/ML) 5 ML SYRINGE
INTRAMUSCULAR | Status: AC
  Filled 2016-10-02: qty 5

## 2016-10-02 MED ORDER — GENTAMICIN SULFATE 40 MG/ML IJ SOLN
80.0000 mg | INTRAMUSCULAR | Status: DC
  Filled 2016-10-02: qty 2

## 2016-10-02 MED ORDER — DEXTROSE 5 % IV SOLN
3.0000 g | INTRAVENOUS | Status: AC
  Administered 2016-10-02: 3 g via INTRAVENOUS
  Filled 2016-10-02: qty 3000

## 2016-10-02 MED ORDER — LACTATED RINGERS IV SOLN
INTRAVENOUS | Status: DC
  Administered 2016-10-02: 08:00:00 via INTRAVENOUS

## 2016-10-02 MED ORDER — MELOXICAM 15 MG PO TABS: 15.0000 mg | ORAL_TABLET | Freq: Every day | ORAL | 1 refills | Status: DC

## 2016-10-02 SURGICAL SUPPLY — 69 items
BLADE MINI RND TIP GREEN BEAV (BLADE) IMPLANT
BLADE SURG 15 STRL LF DISP TIS (BLADE) ×1 IMPLANT
BLADE SURG 15 STRL SS (BLADE) ×3
BNDG CMPR 9X4 STRL LF SNTH (GAUZE/BANDAGES/DRESSINGS) ×1
BNDG COHESIVE 4X5 TAN STRL (GAUZE/BANDAGES/DRESSINGS) ×3 IMPLANT
BNDG ESMARK 4X9 LF (GAUZE/BANDAGES/DRESSINGS) ×3 IMPLANT
BNDG GAUZE ELAST 4 BULKY (GAUZE/BANDAGES/DRESSINGS) ×4 IMPLANT
CHLORAPREP W/TINT 26ML (MISCELLANEOUS) ×3 IMPLANT
CORDS BIPOLAR (ELECTRODE) ×3 IMPLANT
COVER BACK TABLE 60X90IN (DRAPES) ×3 IMPLANT
COVER MAYO STAND STRL (DRAPES) ×3 IMPLANT
CUFF TOURNIQUET SINGLE 18IN (TOURNIQUET CUFF) ×2 IMPLANT
DECANTER SPIKE VIAL GLASS SM (MISCELLANEOUS) IMPLANT
DRAIN PENROSE 1/4X12 LTX STRL (WOUND CARE) IMPLANT
DRAPE HALF SHEET 40X57 (DRAPES) ×3 IMPLANT
DRAPE SURG 17X23 STRL (DRAPES) ×3 IMPLANT
DRSG EMULSION OIL 3X3 NADH (GAUZE/BANDAGES/DRESSINGS) ×3 IMPLANT
ELECT REM PT RETURN 9FT ADLT (ELECTROSURGICAL)
ELECTRODE REM PT RTRN 9FT ADLT (ELECTROSURGICAL) IMPLANT
GAUZE SPONGE 4X4 12PLY STRL (GAUZE/BANDAGES/DRESSINGS) ×3 IMPLANT
GLOVE BIO SURGEON STRL SZ7.5 (GLOVE) ×3 IMPLANT
GLOVE BIOGEL PI IND STRL 6.5 (GLOVE) IMPLANT
GLOVE BIOGEL PI IND STRL 8 (GLOVE) ×1 IMPLANT
GLOVE BIOGEL PI INDICATOR 6.5 (GLOVE) ×2
GLOVE BIOGEL PI INDICATOR 8 (GLOVE) ×2
GLOVE SURG SS PI 6.5 STRL IVOR (GLOVE) ×2 IMPLANT
GUIDE NRV 3X2RSRB NEURAGEN 2M (Tissue) IMPLANT
LOOP VESSEL MAXI BLUE (MISCELLANEOUS) IMPLANT
LOOP VESSEL MINI RED (MISCELLANEOUS) IMPLANT
NDL HYPO 25GX1X1/2 BEV (NEEDLE) IMPLANT
NEEDLE HYPO 25GX1X1/2 BEV (NEEDLE) ×3 IMPLANT
NERVE GUIDE NEURAGEN 2MM (Tissue) ×3 IMPLANT
NS IRRIG 1000ML POUR BTL (IV SOLUTION) ×3 IMPLANT
PACK BASIN DAY SURGERY FS (CUSTOM PROCEDURE TRAY) ×3 IMPLANT
PACK ORTHO EXTREMITY (CUSTOM PROCEDURE TRAY) ×2 IMPLANT
PAD CAST 4YDX4 CTTN HI CHSV (CAST SUPPLIES) ×1 IMPLANT
PADDING CAST ABS 4INX4YD NS (CAST SUPPLIES) ×2
PADDING CAST ABS COTTON 4X4 ST (CAST SUPPLIES) ×1 IMPLANT
PADDING CAST COTTON 4X4 STRL (CAST SUPPLIES) ×3
PENCIL BUTTON HOLSTER BLD 10FT (ELECTRODE) IMPLANT
RUBBERBAND STERILE (MISCELLANEOUS) ×7 IMPLANT
SLEEVE SCD COMPRESS KNEE MED (MISCELLANEOUS) IMPLANT
SLING ARM FOAM STRAP LRG (SOFTGOODS) ×2 IMPLANT
SLING ARM LRG ADULT FOAM STRAP (SOFTGOODS) IMPLANT
SPLINT PLASTER CAST XFAST 3X15 (CAST SUPPLIES) ×7 IMPLANT
SPLINT PLASTER XTRA FASTSET 3X (CAST SUPPLIES) ×14
STOCKINETTE 6  STRL (DRAPES) ×2
STOCKINETTE 6 STRL (DRAPES) ×1 IMPLANT
SUT ETHIBOND 3-0 V-5 (SUTURE) ×2 IMPLANT
SUT ETHILON 8 0 BV130 4 (SUTURE) ×6 IMPLANT
SUT FIBERWIRE 2-0 18 17.9 3/8 (SUTURE)
SUT MERSILENE 4 0 P 3 (SUTURE) IMPLANT
SUT POLY BUTTON 15MM (SUTURE) IMPLANT
SUT PROLENE 6 0 C 1 30 (SUTURE) ×2 IMPLANT
SUT PROLENE 6 0 P 1 18 (SUTURE) IMPLANT
SUT SILK 4 0 TF CR/8 (SUTURE) ×3 IMPLANT
SUT STEEL 4 (SUTURE) IMPLANT
SUT SUPRAMID 3-0 (SUTURE) ×2 IMPLANT
SUT VICRYL 3-0 CR8 SH (SUTURE) IMPLANT
SUT VICRYL RAPIDE 4-0 (SUTURE) IMPLANT
SUT VICRYL RAPIDE 4/0 PS 2 (SUTURE) ×7 IMPLANT
SUTURE FIBERWR 2-0 18 17.9 3/8 (SUTURE) IMPLANT
SYR 10ML LL (SYRINGE) IMPLANT
SYR BULB 3OZ (MISCELLANEOUS) ×3 IMPLANT
SYR CONTROL 10ML LL (SYRINGE) ×2 IMPLANT
TOWEL OR 17X24 6PK STRL BLUE (TOWEL DISPOSABLE) ×3 IMPLANT
TUBE CONNECTING 20'X1/4 (TUBING)
TUBE CONNECTING 20X1/4 (TUBING) IMPLANT
UNDERPAD 30X30 (UNDERPADS AND DIAPERS) ×3 IMPLANT

## 2016-10-02 NOTE — Anesthesia Postprocedure Evaluation (Addendum)
Anesthesia Post Note  Patient: Mason [name]  Procedure(s) Performed: Procedure(s) (LRB): LEFT RING FINGER WOUND EXPORATION AND FLEXOR TENDON REPAIR AND NERVE REPAIR (Left)  Patient location during evaluation: PACU Anesthesia Type: General Level of consciousness: awake and alert Pain management: pain level controlled Vital Signs Assessment: post-procedure vital signs reviewed and stable Respiratory status: spontaneous breathing, nonlabored ventilation, respiratory function stable and patient connected to nasal cannula oxygen Cardiovascular status: blood pressure returned to baseline and stable Postop Assessment: no signs of nausea or vomiting Anesthetic complications: no       Last Vitals:  Vitals:   10/02/16 1235 10/02/16 1240  BP: 115/73 118/76  Pulse: 69 70  Resp: 18 12  Temp:  36.7 C    Last Pain:  Vitals:   10/02/16 1240  TempSrc:   PainSc: 0-No pain                 Flay Ghosh,JAMES TERRILL

## 2016-10-02 NOTE — Anesthesia Preprocedure Evaluation (Addendum)
Anesthesia Evaluation  Patient identified by MRN, date of birth, ID band Patient awake    Reviewed: Allergy & Precautions, H&P , NPO status , Patient's Chart, lab work & pertinent test results  Airway Mallampati: II  TM Distance: >3 FB Neck ROM: Full    Dental no notable dental hx. (+) Teeth Intact, Dental Advisory Given   Pulmonary neg pulmonary ROS,    Pulmonary exam normal breath sounds clear to auscultation       Cardiovascular hypertension, Pt. on medications and Pt. on home beta blockers +CHF  + dysrhythmias Atrial Fibrillation + pacemaker  Rhythm:Regular Rate:Normal     Neuro/Psych Depression negative neurological ROS     GI/Hepatic Neg liver ROS, GERD  Medicated and Controlled,  Endo/Other  diabetes, Type 2, Oral Hypoglycemic Agents  Renal/GU negative Renal ROS  negative genitourinary   Musculoskeletal  (+) Arthritis , Osteoarthritis,    Abdominal   Peds  Hematology negative hematology ROS (+)   Anesthesia Other Findings   Reproductive/Obstetrics negative OB ROS                            Anesthesia Physical Anesthesia Plan  ASA: III  Anesthesia Plan: General   Post-op Pain Management:    Induction: Intravenous  Airway Management Planned: LMA  Additional Equipment:   Intra-op Plan:   Post-operative Plan: Extubation in OR  Informed Consent: I have reviewed the patients History and Physical, chart, labs and discussed the procedure including the risks, benefits and alternatives for the proposed anesthesia with the patient or authorized representative who has indicated his/her understanding and acceptance.   Dental advisory given  Plan Discussed with: CRNA and Surgeon  Anesthesia Plan Comments:        Anesthesia Quick Evaluation

## 2016-10-02 NOTE — Interval H&P Note (Signed)
History and Physical Interval Note:  10/02/2016 9:41 AM  Mason [name]  has presented today for surgery, with the diagnosis of LEFT RING FINGER LACERATION WITH NERVE AND TENDON B84.730Y, L69.437C, K52.591G  The various methods of treatment have been discussed with the patient and family. After consideration of risks, benefits and other options for treatment, the patient has consented to  Procedure(s): LEFT [place] (Left) as a surgical intervention .  The patient's history has been reviewed, patient examined, no change in status, stable for surgery.  I have reviewed the patient's chart and labs.  Questions were answered to the patient's satisfaction.     Jenavi Beedle A.

## 2016-10-02 NOTE — Op Note (Signed)
10/02/2016  9:41 AM  PATIENT:  Mason [name]  76 y.o. male  PRE-OPERATIVE DIAGNOSIS:  L RF laceration with suspected flexor tendon and nerve lacerations  POST-OPERATIVE DIAGNOSIS:  Same--with confirmed lacerations  PROCEDURE:   1. L RF removal of sutures    2. L RF excisional debridement of S/SQ/ Tendon/muscle    3. L RF FDP repair in Zone 2    4/5. L RF RDN and UDN repair with conduit  SURGEON: Rayvon Char. Grandville Silos, MD  PHYSICIAN ASSISTANT: Morley Kos, OPA-C  ANESTHESIA:  general  SPECIMENS:  None  DRAINS:   None  EBL:  less than 50 mL  PREOPERATIVE INDICATIONS:  Mason [name] is a  76 y.o. male with a volar laceration of the L RF over P2, clinically with FDP and RDN/UDN lacerations.  The risks benefits and alternatives were discussed with the patient preoperatively including but not limited to the risks of infection, bleeding, nerve injury, cardiopulmonary complications, the need for revision surgery, among others, and the patient verbalized understanding and consented to proceed.  OPERATIVE IMPLANTS: Neuragen 41mm x 107mm conduit (split to be used as 2 pieces)  OPERATIVE PROCEDURE:  After receiving prophylactic antibiotics, the patient was escorted to the operative theatre and placed in a supine position. General anesthesia was administered.   A surgical "time-out" was performed during which the planned procedure, proposed operative site, and the correct patient identity were compared to the operative consent and agreement confirmed by the circulating nurse according to current facility policy.  Following application of a tourniquet to the operative extremity, the exposed skin was prepped with Chloraprep and draped in the usual sterile fashion.  The limb was exsanguinated with an Esmarch bandage and the tourniquet inflated to approximately 125mmHg higher than systolic BP.  The patient was identified in holding area and escorted to the operative theater where he was placed in  supine position and general anesthesia was a Company secretary.  Maternity is applied to the left arm and not inflated.  The hand and arm were then prepped with ChloraPrep and draped in the usual sterile fashion after pre-scrubbing with Hibiclens.  Surgical timeout was observed and prophylactic antibiotics were provided.  First, the previously placed sutures are removed.  The traumatic wound was then excisionally debrided to include initially skin and subcutaneous tissues, and debridement was with scissors, forceps, and knife.  The traumatic laceration was then extended via surgical incisions proximally exploiting typical mid axial and Brunner lines.  Flaps were retracted with sutures placed within them.  Dissection was carried out for both digital nerves, both proximally and distally, which were excisionally debrided back to more healthy nerve, with pouty fascicles.  It was apparent that both nerve repairs would require conduit or allograft.  Once the nerves were prepared for such, attention was shifted to the flexor digitorum profundus, which was found to have been lacerated, with the proximal stump retracted to the level of the PIP joint.  The end was somewhat mushy, and it was excisionally debrided sharply with a scalpel.  There was enough stump distally to provide for primary repair, even after it was debrided.  The A4 pulley was found to be injured, and the remaining portion was contracted.  With attempts to dilated sufficiently, it ruptured.  Flexor digitorum profundus was then repaired in a primary manner using 3-0 looped Supramid suture in a manner as described by Truman Hayward.  This resulted in an 8 strand repair with the suture knot within the repair site.  A 6-0 Prolene locked running epitendinous suture was then also placed and that was no gapping.  The A4 pulley was reconstructed with the ulnar slip of the FDS, which was transected proximally, left here and distally and used to re-create and A4 pulley, securing it to  the remnant radially of the A4 pulley.  Next, attention was shifted to the digital nerves.  The radial side was repaired first, with standard conduit repair using microsurgical techniques, 8-0 nylon sutures, and 2 mm Neuragen conduit.  The ulnar side was repaired secondly, using the remainder of the conduit, which had been split into pieces.  The tourniquet was released, the wound irrigated and half percent plain Marcaine instilled at the base of the digit for postoperative pain control.  The traumatic wound was reapproximated with 4-0 Vicryl Rapide interrupted sutures.  The surgical incisions were closed with the same suture type.  The digit was slow to become pink, but there was hemorrhage in the distal flap.  The digit again assumed a flexed posture.  A short arm splint dressing was applied with a dorsal plaster component, placing the wrist in slight extension and the MP joints flexed, and he was awakened and taken to the recovery room in stable condition, breathing spontaneously.  DISPOSITION: He will be discharged home today with typical instructions, returning as previously planned therapy tomorrow to have a custom splint fabricated and begin structured flexor tendon rehabilitation.

## 2016-10-02 NOTE — Discharge Instructions (Addendum)
Discharge Instructions   You have a dressing with a plaster splint incorporated in it. Move your fingers as much as possible within the confines of your bandage, making a full fist and fully opening the fist. Elevate your hand to reduce pain & swelling of the digits.  Ice over the operative site may be helpful to reduce pain & swelling.  DO NOT USE HEAT. Pain medicine has been prescribed for you.  Take ibuprofen and Tylenol over the counter every 6 hours per the orders provided. Take the pain medicine as needed for severe pain. Leave the dressing in place until you return to our office.  You may shower, but keep the bandage clean & dry.  You may drive a car when you are off of prescription pain medications and can safely control your vehicle with both hands. Call our office to make an appointment in 10-15 days form the date of surgery.   KEEP YOUR THERAPY VISIT FOR TOMORROW AS PLANNED   Please call [phone] during normal business hours or [phone] after hours for any problems. Including the following:  - excessive redness of the incisions - drainage for more than 4 days - fever of more than 101.5 F  *Please note that pain medications will not be refilled after hours or on weekends.

## 2016-10-02 NOTE — Anesthesia Procedure Notes (Signed)
Procedure Name: LMA Insertion Date/Time: 10/02/2016 9:56 AM Performed by: Candis Shine Pre-anesthesia Checklist: Patient identified, Emergency Drugs available, Suction available and Patient being monitored Patient Re-evaluated:Patient Re-evaluated prior to inductionOxygen Delivery Method: Circle System Utilized Preoxygenation: Pre-oxygenation with 100% oxygen Intubation Type: IV induction Ventilation: Mask ventilation without difficulty LMA: LMA inserted LMA Size: 5.0 Number of attempts: 1 Airway Equipment and Method: Bite block Placement Confirmation: positive ETCO2 Tube secured with: Tape Dental Injury: Teeth and Oropharynx as per pre-operative assessment

## 2016-10-02 NOTE — Transfer of Care (Signed)
Immediate Anesthesia Transfer of Care Note  Patient: Mason [name]  Procedure(s) Performed: Procedure(s): LEFT RING FINGER WOUND EXPORATION AND FLEXOR TENDON REPAIR AND NERVE REPAIR (Left)  Patient Location: PACU  Anesthesia Type:General  Level of Consciousness: awake, alert  and oriented  Airway & Oxygen Therapy: Patient Spontanous Breathing and Patient connected to nasal cannula oxygen  Post-op Assessment: Report given to RN and Post -op Vital signs reviewed and stable  Post vital signs: Reviewed and stable  Last Vitals:  Vitals:   10/02/16 0723  BP: 129/75  Pulse: 73  Resp: 16  Temp: 36.8 C    Last Pain:  Vitals:   10/02/16 0723  TempSrc: Oral      Patients Stated Pain Goal: 4 (54/56/25 [id number])  Complications: No apparent anesthesia complications

## 2016-10-02 NOTE — Progress Notes (Signed)
Medtronic Carelink Express Monitor used at bedside on patients pacemaker. Awaiting confirmation phone call.

## 2016-10-03 ENCOUNTER — Encounter (HOSPITAL_COMMUNITY): Payer: Self-pay | Admitting: Orthopedic Surgery

## 2016-10-04 DIAGNOSIS — S61215D Laceration without foreign body of left ring finger without damage to nail, subsequent encounter: Secondary | ICD-10-CM | POA: Diagnosis not present

## 2016-10-04 DIAGNOSIS — S64495D Injury of digital nerve of left ring finger, subsequent encounter: Secondary | ICD-10-CM | POA: Diagnosis not present

## 2016-10-04 DIAGNOSIS — S66195D Other injury of flexor muscle, fascia and tendon of left ring finger at wrist and hand level, subsequent encounter: Secondary | ICD-10-CM | POA: Diagnosis not present

## 2016-10-05 DIAGNOSIS — M19012 Primary osteoarthritis, left shoulder: Secondary | ICD-10-CM | POA: Diagnosis not present

## 2016-10-05 DIAGNOSIS — M25512 Pain in left shoulder: Secondary | ICD-10-CM | POA: Diagnosis not present

## 2016-10-10 DIAGNOSIS — S61215D Laceration without foreign body of left ring finger without damage to nail, subsequent encounter: Secondary | ICD-10-CM | POA: Diagnosis not present

## 2016-10-10 DIAGNOSIS — S64495D Injury of digital nerve of left ring finger, subsequent encounter: Secondary | ICD-10-CM | POA: Diagnosis not present

## 2016-10-10 DIAGNOSIS — S66195D Other injury of flexor muscle, fascia and tendon of left ring finger at wrist and hand level, subsequent encounter: Secondary | ICD-10-CM | POA: Diagnosis not present

## 2016-10-12 DIAGNOSIS — M19012 Primary osteoarthritis, left shoulder: Secondary | ICD-10-CM | POA: Diagnosis not present

## 2016-10-12 DIAGNOSIS — M25512 Pain in left shoulder: Secondary | ICD-10-CM | POA: Diagnosis not present

## 2016-10-13 DIAGNOSIS — S61215D Laceration without foreign body of left ring finger without damage to nail, subsequent encounter: Secondary | ICD-10-CM | POA: Diagnosis not present

## 2016-10-13 DIAGNOSIS — S66195D Other injury of flexor muscle, fascia and tendon of left ring finger at wrist and hand level, subsequent encounter: Secondary | ICD-10-CM | POA: Diagnosis not present

## 2016-10-13 DIAGNOSIS — S64495D Injury of digital nerve of left ring finger, subsequent encounter: Secondary | ICD-10-CM | POA: Diagnosis not present

## 2016-10-16 DIAGNOSIS — S66195D Other injury of flexor muscle, fascia and tendon of left ring finger at wrist and hand level, subsequent encounter: Secondary | ICD-10-CM | POA: Diagnosis not present

## 2016-10-16 DIAGNOSIS — S64495D Injury of digital nerve of left ring finger, subsequent encounter: Secondary | ICD-10-CM | POA: Diagnosis not present

## 2016-10-16 DIAGNOSIS — S61215D Laceration without foreign body of left ring finger without damage to nail, subsequent encounter: Secondary | ICD-10-CM | POA: Diagnosis not present

## 2016-10-17 DIAGNOSIS — S66125A Laceration of flexor muscle, fascia and tendon of left ring finger at wrist and hand level, initial encounter: Secondary | ICD-10-CM | POA: Diagnosis not present

## 2016-10-17 DIAGNOSIS — S61215D Laceration without foreign body of left ring finger without damage to nail, subsequent encounter: Secondary | ICD-10-CM | POA: Diagnosis not present

## 2016-10-17 DIAGNOSIS — S64495D Injury of digital nerve of left ring finger, subsequent encounter: Secondary | ICD-10-CM | POA: Diagnosis not present

## 2016-10-17 DIAGNOSIS — S66195D Other injury of flexor muscle, fascia and tendon of left ring finger at wrist and hand level, subsequent encounter: Secondary | ICD-10-CM | POA: Diagnosis not present

## 2016-10-26 ENCOUNTER — Telehealth: Payer: Self-pay | Admitting: *Deleted

## 2016-10-26 NOTE — Telephone Encounter (Signed)
Patient apologizes for not calling back before.  He explains that he had an accident w/ a skill saw in beginning of March and had to have surgery on left hand finger/s.  He is currently in a cast and recovering. He would prefer to wait until next month for CRT upgrade.  He asks me to call back 1st/2nd week of May to schedule procedure.  Discussed risks of waiting to upgrade and pt understands these risks and still wants to wait.  Will follow up w/ pt next month.

## 2016-10-26 NOTE — Telephone Encounter (Signed)
Follow up   Pt is returning call to [facility].

## 2016-10-26 NOTE — Telephone Encounter (Signed)
lmtcb to schedule CRT-D upgrade

## 2016-10-31 DIAGNOSIS — S64495D Injury of digital nerve of left ring finger, subsequent encounter: Secondary | ICD-10-CM | POA: Diagnosis not present

## 2016-10-31 DIAGNOSIS — S66195D Other injury of flexor muscle, fascia and tendon of left ring finger at wrist and hand level, subsequent encounter: Secondary | ICD-10-CM | POA: Diagnosis not present

## 2016-10-31 DIAGNOSIS — S61215D Laceration without foreign body of left ring finger without damage to nail, subsequent encounter: Secondary | ICD-10-CM | POA: Diagnosis not present

## 2016-11-01 DIAGNOSIS — M19012 Primary osteoarthritis, left shoulder: Secondary | ICD-10-CM | POA: Diagnosis not present

## 2016-11-01 DIAGNOSIS — M25512 Pain in left shoulder: Secondary | ICD-10-CM | POA: Diagnosis not present

## 2016-11-02 DIAGNOSIS — M19012 Primary osteoarthritis, left shoulder: Secondary | ICD-10-CM | POA: Diagnosis not present

## 2016-11-02 DIAGNOSIS — M25512 Pain in left shoulder: Secondary | ICD-10-CM | POA: Diagnosis not present

## 2016-11-03 DIAGNOSIS — S64495D Injury of digital nerve of left ring finger, subsequent encounter: Secondary | ICD-10-CM | POA: Diagnosis not present

## 2016-11-03 DIAGNOSIS — S66195D Other injury of flexor muscle, fascia and tendon of left ring finger at wrist and hand level, subsequent encounter: Secondary | ICD-10-CM | POA: Diagnosis not present

## 2016-11-03 DIAGNOSIS — S61215D Laceration without foreign body of left ring finger without damage to nail, subsequent encounter: Secondary | ICD-10-CM | POA: Diagnosis not present

## 2016-11-06 DIAGNOSIS — M25512 Pain in left shoulder: Secondary | ICD-10-CM | POA: Diagnosis not present

## 2016-11-06 DIAGNOSIS — M19012 Primary osteoarthritis, left shoulder: Secondary | ICD-10-CM | POA: Diagnosis not present

## 2016-11-09 DIAGNOSIS — M25512 Pain in left shoulder: Secondary | ICD-10-CM | POA: Diagnosis not present

## 2016-11-09 DIAGNOSIS — M19012 Primary osteoarthritis, left shoulder: Secondary | ICD-10-CM | POA: Diagnosis not present

## 2016-11-10 DIAGNOSIS — S64495D Injury of digital nerve of left ring finger, subsequent encounter: Secondary | ICD-10-CM | POA: Diagnosis not present

## 2016-11-10 DIAGNOSIS — S61215D Laceration without foreign body of left ring finger without damage to nail, subsequent encounter: Secondary | ICD-10-CM | POA: Diagnosis not present

## 2016-11-10 DIAGNOSIS — S66195D Other injury of flexor muscle, fascia and tendon of left ring finger at wrist and hand level, subsequent encounter: Secondary | ICD-10-CM | POA: Diagnosis not present

## 2016-11-13 ENCOUNTER — Other Ambulatory Visit: Payer: Self-pay | Admitting: Family Medicine

## 2016-11-13 ENCOUNTER — Other Ambulatory Visit: Payer: Self-pay | Admitting: Cardiovascular Disease

## 2016-11-13 MED ORDER — OMEPRAZOLE 40 MG PO CPDR: 40.0000 mg | DELAYED_RELEASE_CAPSULE | Freq: Every day | ORAL | 0 refills | Status: DC

## 2016-11-13 NOTE — Telephone Encounter (Signed)
[employer].  RF request for gabapentin LOV: 07/03/16 Next ov: None Last written: 10/20/15 #270 w/ 3RF  Please advise. Thanks.    ______________________________________________________  SW pt and he stated that he has been taking omeprazole. Pt was advised that he needs office visit, apt made for 11/16/16 at 8:00am.  RF request for glipizide Last written: 03/28/16 #90 w/ 1RF  RF request for metformin Last written: 03/28/16 #180 w/ 1RF  RF request for Celesta Gentile Last written: 03/31/16 #90 w/ 1RF  RF request for simvastatin Last written: 03/28/16 #90 w/ 1RF  RF request for omeprazole Last written: 12/14/15 #90 w/ 3RF

## 2016-11-13 NOTE — Telephone Encounter (Signed)
Left message with pts wife to have pt call back.   1) Pt needs office visit for follow up, per Dr. Idelle Leech last office note he was to f/u around 10/01/16.  2) Need to know if pt is taking Nexium or Omeprazole? Nexium has been requested by pharmacy but this is not on medication list, omeprazole is.

## 2016-11-14 DIAGNOSIS — S66125A Laceration of flexor muscle, fascia and tendon of left ring finger at wrist and hand level, initial encounter: Secondary | ICD-10-CM | POA: Diagnosis not present

## 2016-11-14 DIAGNOSIS — S61215D Laceration without foreign body of left ring finger without damage to nail, subsequent encounter: Secondary | ICD-10-CM | POA: Diagnosis not present

## 2016-11-16 ENCOUNTER — Ambulatory Visit (INDEPENDENT_AMBULATORY_CARE_PROVIDER_SITE_OTHER): Payer: Medicare Other | Admitting: Family Medicine

## 2016-11-16 ENCOUNTER — Encounter: Payer: Self-pay | Admitting: Family Medicine

## 2016-11-16 VITALS — BP 121/80 | HR 78 | Temp 98.1°F | Resp 16 | Ht 71.0 in | Wt 228.5 lb

## 2016-11-16 DIAGNOSIS — N183 Chronic kidney disease, stage 3 unspecified: Secondary | ICD-10-CM

## 2016-11-16 DIAGNOSIS — E118 Type 2 diabetes mellitus with unspecified complications: Secondary | ICD-10-CM | POA: Diagnosis not present

## 2016-11-16 DIAGNOSIS — S61215D Laceration without foreign body of left ring finger without damage to nail, subsequent encounter: Secondary | ICD-10-CM | POA: Diagnosis not present

## 2016-11-16 DIAGNOSIS — S66195D Other injury of flexor muscle, fascia and tendon of left ring finger at wrist and hand level, subsequent encounter: Secondary | ICD-10-CM | POA: Diagnosis not present

## 2016-11-16 DIAGNOSIS — I10 Essential (primary) hypertension: Secondary | ICD-10-CM

## 2016-11-16 DIAGNOSIS — S64495D Injury of digital nerve of left ring finger, subsequent encounter: Secondary | ICD-10-CM | POA: Diagnosis not present

## 2016-11-16 LAB — POCT GLYCOSYLATED HEMOGLOBIN (HGB A1C): Hemoglobin A1C: 7.1

## 2016-11-16 LAB — MICROALBUMIN / CREATININE URINE RATIO
Creatinine,U: 97.6 mg/dL
Microalb Creat Ratio: 0.7 mg/g (ref 0.0–30.0)
Microalb, Ur: 0.7 mg/dL (ref 0.0–1.9)

## 2016-11-16 NOTE — Progress Notes (Signed)
OFFICE VISIT  11/16/2016   CC:  Chief Complaint  Patient presents with  . Follow-up    RCI, pt is fasting.   HPI:    Patient is a 76 y.o. 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 He has persistent a-fib and nonischemic CM which are managed by his cardiologist.  DM 2; not monitoring glucoses except for when he feels hypoglycemic.  He notes that this happens when he goes a prolonged period w/out eating during his day.   Feet: no pain, tingling, or numbness.  HTN: occ check at home 120s/80s usually.  CRI: avoids NSAIDs.  Tries to pay attention to fluid.  ROS: no CP, no melena or hematochezia or easy bruising, no LE swelling, no SOB, no cough, no fever.  Past Medical History:  Diagnosis Date  . Arthritis    left shoulder (Dr. [name]): steroid injection 04/03/16.  Plan for TSA surgery 06/2016.  [name] BPH (benign prostatic hypertrophy) 06/2011   Irritative sx's; pt declined trial of anticholinergic per Urology records  . CHF (congestive heart failure) ([place])   . Chronic combined systolic and diastolic heart failure ([place]) 05/31/2012   Nonischemic:  EF 40-45%, LA mod-severe dilated, AFIB.   02/2016 EF 40%, diffuse hypokinesis, grade 2 DD.  Myoc perf imaging showed EF 32% 04/2016.  Pt to get upgrade to CRT-D as of 08/2016.  [name] Chronic low back pain   . Chronic renal insufficiency, stage III (moderate) 2015   CrCl about 60 ml/min  . Depression   . Diabetes mellitus    Type II  . DOE (dyspnea on exertion)    NYHA class I CHF  . Dyspnea    with exertion  . Episodic low back pain 01/22/2013   w/intermittent radiculitis (12/2014 his neurologist referred him to pain mgmt for epidural steroid injection)  . GERD (gastroesophageal reflux disease)   . H/O tilt table evaluation 11/02/05   negative  . Helicobacter pylori gastritis 01/2016  . History of adenomatous polyp of colon 10/12/11   Dr. [name] (3 right side of colon- tubular adenomas removed)  .  History of cardiovascular stress test 05/28/12   no ischemia, EF 37%, imaging results are unchanged and within normal variance  . History of chronic prostatitis   . History of retinal detachment    OS  . History of vertigo    + Hx of posterior HA's.  Neuro (Dr. Erling Cruz) eval 2011.  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  . Hypertension   . Lumbar spondylosis    lumbosacral radiculopathy at L4 by EMG testing, right foot drop (neurologist is Dr. Linus Salmons with [facility] in [place])--neurologist referred him to neurosurgery  . Nephrolithiasis 07/2012   Left UVJ 2 mm stone with dilation of renal collecting system and slight hydroureter on right  . Neuropathy   . 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  Since no CAD on cath 05/2016, cards recommends upgrade to CRT-D.  [name] Permanent atrial fibrillation ([place])    DCCV 07/09/13-converted, lasted two days, then back into afib--needs lifetime anticoagulation (Xarelto as of 09/2014)    Past Surgical History:  Procedure Laterality Date  . CARDIAC CATHETERIZATION N/A 06/14/2016   Minimal nonobstructive dz, EF 25-35%.  Procedure: Left Heart Cath and Coronary Angiography;  Surgeon: Peter M Martinique, MD;  Location: [facility];  Service: Cardiovascular;  Laterality:  N/A;  . CARDIOVASCULAR STRESS TEST  2012   2012 nuclear perfusion study: low risk scan; 04/2016 normal myocardial perfusion imaging, EF 32%.  [name] CARDIOVERSION  07/09/2012   Procedure: CARDIOVERSION;  Surgeon: [name], MD;  Location: [facility];  Service: Cardiovascular;  Laterality: N/A;  . CATARACT EXTRACTION Bilateral   . COLONOSCOPY W/ POLYPECTOMY  approx 2006; repeated 09/2011   Polyps on 2013 EGD as well, repeat 12/2014  . ESOPHAGOGASTRODUODENOSCOPY  10/18/06   Done due to chronic GERD: Normal, bx showed no barrett's esophagus (Dr. [name])   . EYE SURGERY     retinal detachment OS  . FLEXOR TENDON REPAIR Left 10/02/2016   Procedure: LEFT RING FINGER WOUND EXPORATION AND FLEXOR TENDON REPAIR AND NERVE REPAIR;  Surgeon: Milly Jakob, MD;  Location: [facility];  Service: Orthopedics;  Laterality: Left;  . INSERT / REPLACE / REMOVE PACEMAKER  02/05/2012   dual chamber, sinus node dysfunction, sinus arrest, PAF, Medtronic Revo serial#-[id number] H: last checked 05/2015  . LUMBAR LAMINECTOMY  1976   left L4-5  . PERMANENT PACEMAKER INSERTION N/A 02/05/2012   Procedure: PERMANENT PACEMAKER INSERTION;  Surgeon: [name], MD; Generator Medtronic Wilder model [place] serial number [id number] H Laterality: N/A;  . SHOULDER SURGERY Left 2018   Left shoulder reverse TSA [name] in [place]).  . TRANSTHORACIC ECHOCARDIOGRAM  08/25/10; 05/2012; 03/23/16   mild asymmetric LVH, normal systolic function, normal diastolic fxn, mild-to-mod mitral regurg, mild aortic valve sclerosis and trace AI, mild aortic root dilatation. 2014 f/u showed EF 40-45%, mod LAE, A FIB.  02/2016 EF 40%, diffuse hypokinesis, grade 2 DD.    Outpatient Medications Prior to Visit  Medication Sig Dispense Refill  . acetaminophen (TYLENOL) 325 MG tablet Take 2 tablets (650 mg total) by mouth every 6 (six) hours as needed for mild pain or moderate pain.    [name] colchicine 0.6 MG tablet Take 0.6 mg by mouth as needed (for gout).     . COMBIGAN 0.2-0.5 % ophthalmic solution Place 1 drop into both eyes 2 (two) times daily.     [name] ENTRESTO 97-103 MG Take 1 tablet by mouth 2 (two) times daily. 180 tablet 1  . gabapentin (NEURONTIN) 300 MG capsule TAKE 1 CAPSULE THREE TIMES A DAY 270 capsule 3  . glucose blood test strip Use to check blood sugars 1-2 times daily 100 each 1  . JANUVIA 100 MG tablet TAKE 1 TABLET DAILY 90 tablet 0  . Lancets (PRECISION ULTRA LANCET) MISC Use as instructed 100 each 3  . latanoprost (XALATAN) 0.005 % ophthalmic solution Place 1 drop into both eyes at  bedtime.   12  . meclizine (ANTIVERT) 25 MG tablet Take 25 mg by mouth 3 (three) times daily as needed for dizziness.    . metFORMIN (GLUCOPHAGE) 1000 MG tablet TAKE 1 TABLET TWICE A DAY 180 tablet 0  . metoprolol succinate (TOPROL-XL) 100 MG 24 hr tablet 1 and 1/2 tabs po qd (Patient taking differently: Take 50-100 mg by mouth See admin instructions. Take 100 mg by mouth in the morning and take 50 mg by mouth at night) 135 tablet 3  . omeprazole (PRILOSEC) 40 MG capsule Take 1 capsule (40 mg total) by mouth daily. 90 capsule 0  . ONE TOUCH LANCETS MISC Use to check blood sugar 1-2 times daily 200 each 0  . rOPINIRole (REQUIP) 1 MG tablet TAKE 1 TABLET DAILY (NEED OFFICE VISIT FOR MORE REFILLS) (Patient taking differently: Take '1mg'$  at bedtime) 90 tablet 1  .  sertraline (ZOLOFT) 100 MG tablet TAKE 1 TABLET DAILY 90 tablet 1  . simvastatin (ZOCOR) 20 MG tablet TAKE 1 TABLET EVERY EVENING 90 tablet 0  . XARELTO 20 MG TABS tablet TAKE 1 TABLET DAILY WITH SUPPER 90 tablet 1  . GLIPIZIDE XL 5 MG 24 hr tablet TAKE 1 TABLET EVERY MORNING 90 tablet 0  . meloxicam (MOBIC) 15 MG tablet Take 1 tablet (15 mg total) by mouth daily. 30 tablet 1  . blood glucose meter kit and supplies KIT Use to check blood sugar 1 to 2 times daily (Patient not taking: Reported on 10/02/2016) 1 each 0  . cephALEXin (KEFLEX) 500 MG capsule Take 1 capsule (500 mg total) by mouth 4 (four) times daily. (Patient not taking: Reported on 11/16/2016) 20 capsule 0  . oxyCODONE (ROXICODONE) 5 MG immediate release tablet Take 1 tablet (5 mg total) by mouth every 6 (six) hours as needed for severe pain. (Patient not taking: Reported on 11/16/2016) 30 tablet 0   No facility-administered medications prior to visit.     No Known Allergies  ROS As per HPI  PE: Blood pressure 121/80, pulse 78, temperature 98.1 F (36.7 C), temperature source Oral, resp. rate 16, height '5\' 11"'$  (1.803 m), weight 228 lb 8 oz (103.6 kg), SpO2 97 %. Gen: Alert,  well appearing.  Patient is oriented to person, place, time, and situation. AFFECT: pleasant, lucid thought and speech. Foot exam - bilateral normal; no swelling, tenderness or skin or vascular lesions. Color and temperature is normal. Sensation is intact. Peripheral pulses are palpable. Toenails are normal.   LABS:  Lab Results  Component Value Date   TSH 3.37 06/06/2016   Lab Results  Component Value Date   WBC 4.4 10/02/2016   HGB 13.6 10/02/2016   HCT 41.4 10/02/2016   MCV 86.3 10/02/2016   PLT 169 10/02/2016   Lab Results  Component Value Date   CREATININE 1.14 10/02/2016   BUN 16 10/02/2016   NA 140 10/02/2016   K 4.0 10/02/2016   CL 106 10/02/2016   CO2 24 10/02/2016   Lab Results  Component Value Date   ALT 17 02/21/2016   AST 17 02/21/2016   ALKPHOS 78 02/21/2016   BILITOT 0.7 02/21/2016   Lab Results  Component Value Date   CHOL 148 07/03/2016   Lab Results  Component Value Date   HDL 41.40 07/03/2016   Lab Results  Component Value Date   LDLCALC 72 07/03/2016   Lab Results  Component Value Date   TRIG 172.0 (H) 07/03/2016   Lab Results  Component Value Date   CHOLHDL 4 07/03/2016   Lab Results  Component Value Date   HGBA1C 7.1 11/16/2016   POC HbA1c today: 7.1%  IMPRESSION AND PLAN:  1) DM 2, good control/iimproved. Given his occ hypoglycemia and his resistance to trying a change in eating schedule/habits, will d/c glipizide. He'll monitor his glucoses more appropriately and if persistently elevated he'll call/return. HbA1c, urine microalb/cr, and foot exam--done.  2) HTN; The current medical regimen is effective;  continue present plan and medications. Lytes/cr recently stable about 6 wks ago.  3) CRI stage II/III: stable Cr/GFR 6 weeks ago. Avoiding NSAIDs.  Needs to try to pay more attention to adequate clear fluid intake.  An After Visit Summary was printed and given to the patient.  FOLLOW UP: Return in about 3 months (around  02/15/2017).  Signed:  Crissie Sickles, MD  11/16/2016     

## 2016-11-16 NOTE — Progress Notes (Signed)
Pre visit review using our clinic review tool, if applicable. No additional management support is needed unless otherwise documented below in the visit note. 

## 2016-11-21 DIAGNOSIS — S64495D Injury of digital nerve of left ring finger, subsequent encounter: Secondary | ICD-10-CM | POA: Diagnosis not present

## 2016-11-21 DIAGNOSIS — S66195D Other injury of flexor muscle, fascia and tendon of left ring finger at wrist and hand level, subsequent encounter: Secondary | ICD-10-CM | POA: Diagnosis not present

## 2016-11-21 DIAGNOSIS — S61215D Laceration without foreign body of left ring finger without damage to nail, subsequent encounter: Secondary | ICD-10-CM | POA: Diagnosis not present

## 2016-11-24 DIAGNOSIS — S66195D Other injury of flexor muscle, fascia and tendon of left ring finger at wrist and hand level, subsequent encounter: Secondary | ICD-10-CM | POA: Diagnosis not present

## 2016-11-24 DIAGNOSIS — S61215D Laceration without foreign body of left ring finger without damage to nail, subsequent encounter: Secondary | ICD-10-CM | POA: Diagnosis not present

## 2016-11-24 DIAGNOSIS — S64495D Injury of digital nerve of left ring finger, subsequent encounter: Secondary | ICD-10-CM | POA: Diagnosis not present

## 2016-11-28 DIAGNOSIS — S64495D Injury of digital nerve of left ring finger, subsequent encounter: Secondary | ICD-10-CM | POA: Diagnosis not present

## 2016-11-28 DIAGNOSIS — S66195D Other injury of flexor muscle, fascia and tendon of left ring finger at wrist and hand level, subsequent encounter: Secondary | ICD-10-CM | POA: Diagnosis not present

## 2016-11-28 DIAGNOSIS — S61215D Laceration without foreign body of left ring finger without damage to nail, subsequent encounter: Secondary | ICD-10-CM | POA: Diagnosis not present

## 2016-12-01 DIAGNOSIS — S66195D Other injury of flexor muscle, fascia and tendon of left ring finger at wrist and hand level, subsequent encounter: Secondary | ICD-10-CM | POA: Diagnosis not present

## 2016-12-01 DIAGNOSIS — S64495D Injury of digital nerve of left ring finger, subsequent encounter: Secondary | ICD-10-CM | POA: Diagnosis not present

## 2016-12-01 DIAGNOSIS — S61215D Laceration without foreign body of left ring finger without damage to nail, subsequent encounter: Secondary | ICD-10-CM | POA: Diagnosis not present

## 2016-12-04 DIAGNOSIS — S61215D Laceration without foreign body of left ring finger without damage to nail, subsequent encounter: Secondary | ICD-10-CM | POA: Diagnosis not present

## 2016-12-04 DIAGNOSIS — S66195D Other injury of flexor muscle, fascia and tendon of left ring finger at wrist and hand level, subsequent encounter: Secondary | ICD-10-CM | POA: Diagnosis not present

## 2016-12-04 DIAGNOSIS — S64495D Injury of digital nerve of left ring finger, subsequent encounter: Secondary | ICD-10-CM | POA: Diagnosis not present

## 2016-12-07 DIAGNOSIS — S61215D Laceration without foreign body of left ring finger without damage to nail, subsequent encounter: Secondary | ICD-10-CM | POA: Diagnosis not present

## 2016-12-07 DIAGNOSIS — S64495D Injury of digital nerve of left ring finger, subsequent encounter: Secondary | ICD-10-CM | POA: Diagnosis not present

## 2016-12-07 DIAGNOSIS — S66195D Other injury of flexor muscle, fascia and tendon of left ring finger at wrist and hand level, subsequent encounter: Secondary | ICD-10-CM | POA: Diagnosis not present

## 2016-12-11 ENCOUNTER — Telehealth: Payer: Self-pay | Admitting: Cardiology

## 2016-12-11 DIAGNOSIS — I4821 Permanent atrial fibrillation: Secondary | ICD-10-CM

## 2016-12-11 DIAGNOSIS — Z01812 Encounter for preprocedural laboratory examination: Secondary | ICD-10-CM

## 2016-12-11 NOTE — Telephone Encounter (Signed)
Per our last discussion 4/5, I was to call him 1st/2nd week of this month.  Apologized to Mason [name] for not getting back with him yet and explained reasoning for delay.  Mason [name] was understandable and stated I didn't have to apologize.  Mason [name] would like to have procedure on 6/7.  He understands I will arrange and call him by the end of week.  He is agreeable and thanks me for helping.

## 2016-12-11 NOTE — Telephone Encounter (Signed)
New message    Pt states that he is supposed to have a defibrillator put in and was supposed to get a call from Dr. Curt Bears nurse and never did

## 2016-12-12 DIAGNOSIS — S61215D Laceration without foreign body of left ring finger without damage to nail, subsequent encounter: Secondary | ICD-10-CM | POA: Diagnosis not present

## 2016-12-12 DIAGNOSIS — S64495D Injury of digital nerve of left ring finger, subsequent encounter: Secondary | ICD-10-CM | POA: Diagnosis not present

## 2016-12-12 DIAGNOSIS — S66125A Laceration of flexor muscle, fascia and tendon of left ring finger at wrist and hand level, initial encounter: Secondary | ICD-10-CM | POA: Diagnosis not present

## 2016-12-12 DIAGNOSIS — S66195D Other injury of flexor muscle, fascia and tendon of left ring finger at wrist and hand level, subsequent encounter: Secondary | ICD-10-CM | POA: Diagnosis not present

## 2016-12-13 ENCOUNTER — Ambulatory Visit (INDEPENDENT_AMBULATORY_CARE_PROVIDER_SITE_OTHER): Payer: Medicare Other | Admitting: Family Medicine

## 2016-12-13 ENCOUNTER — Encounter: Payer: Self-pay | Admitting: Family Medicine

## 2016-12-13 VITALS — BP 146/99 | HR 76 | Temp 97.6°F | Resp 16 | Wt 225.0 lb

## 2016-12-13 DIAGNOSIS — T148XXA Other injury of unspecified body region, initial encounter: Secondary | ICD-10-CM

## 2016-12-13 NOTE — Progress Notes (Signed)
OFFICE VISIT  12/13/2016   CC:  Chief Complaint  Patient presents with  . Abrasion    Left arm, wire puncture   HPI:    Patient is a 76 y.o. Caucasian male who presents for a "ring on L arm". About 2 weeks ago, a piece of chicken wire punctured his distal L arm in 2 places--small. There is some discoloration around the healed puncture wounds.  No redness.  No signif pain at the site.  No drainage. 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 Most recent tetanus booster was 08/2011.  Past Medical History:  Diagnosis Date  . Arthritis    left shoulder (Dr. [name]): steroid injection 04/03/16.  Plan for TSA surgery 06/2016.  [name] BPH (benign prostatic hypertrophy) 06/2011   Irritative sx's; pt declined trial of anticholinergic per Urology records  . CHF (congestive heart failure) ([place])   . Chronic combined systolic and diastolic heart failure ([place]) 05/31/2012   Nonischemic:  EF 40-45%, LA mod-severe dilated, AFIB.   02/2016 EF 40%, diffuse hypokinesis, grade 2 DD.  Myoc perf imaging showed EF 32% 04/2016.  Pt to get upgrade to CRT-D as of 08/2016.  [name] Chronic low back pain   . Chronic renal insufficiency, stage III (moderate) 2015   CrCl about 60 ml/min  . Depression   . Diabetes mellitus    Type II  . DOE (dyspnea on exertion)    NYHA class I CHF  . Dyspnea    with exertion  . Episodic low back pain 01/22/2013   w/intermittent radiculitis (12/2014 his neurologist referred him to pain mgmt for epidural steroid injection)  . GERD (gastroesophageal reflux disease)   . H/O tilt table evaluation 11/02/05   negative  . Helicobacter pylori gastritis 01/2016  . History of adenomatous polyp of colon 10/12/11   Dr. [name] (3 right side of colon- tubular adenomas removed)  . History of cardiovascular stress test 05/28/12   no ischemia, EF 37%, imaging results are unchanged and within normal variance  . History of chronic prostatitis   . History of retinal detachment    OS  . History of vertigo    + Hx of  posterior HA's.  Neuro (Dr. Erling Cruz) eval 2011.  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  . Hypertension   . Lumbar spondylosis    lumbosacral radiculopathy at L4 by EMG testing, right foot drop (neurologist is Dr. Linus Salmons with [facility] in [place])--neurologist referred him to neurosurgery  . Nephrolithiasis 07/2012   Left UVJ 2 mm stone with dilation of renal collecting system and slight hydroureter on right  . Neuropathy   . 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  Since no CAD on cath 05/2016, cards recommends upgrade to CRT-D.  [name] Permanent atrial fibrillation ([place])    DCCV 07/09/13-converted, lasted two days, then back into afib--needs lifetime anticoagulation (Xarelto as of 09/2014)    Past Surgical History:  Procedure Laterality Date  . CARDIAC CATHETERIZATION N/A 06/14/2016   Minimal nonobstructive dz, EF 25-35%.  Procedure: Left Heart Cath and Coronary Angiography;  Surgeon: Peter M Martinique, MD;  Location: [place] [facility];  Service: Cardiovascular;  Laterality: N/A;  . CARDIOVASCULAR STRESS TEST  2012   2012 nuclear perfusion study: low risk scan; 04/2016 normal myocardial perfusion imaging, EF 32%.  [name] CARDIOVERSION  07/09/2012   Procedure: CARDIOVERSION;  Surgeon: [name], MD;  Location: [facility];  Service: Cardiovascular;  Laterality: N/A;  . CATARACT EXTRACTION Bilateral   . COLONOSCOPY W/ POLYPECTOMY  approx 2006; repeated 09/2011   Polyps on 2013 EGD as well, repeat 12/2014  . ESOPHAGOGASTRODUODENOSCOPY  10/18/06   Done due to chronic GERD: Normal, bx showed no barrett's esophagus (Dr. [name])  . EYE SURGERY     retinal detachment OS  . FLEXOR TENDON REPAIR Left 10/02/2016   Procedure: LEFT RING FINGER WOUND EXPORATION AND FLEXOR TENDON REPAIR AND NERVE REPAIR;  Surgeon: Milly Jakob, MD;  Location: [facility];  Service:  Orthopedics;  Laterality: Left;  . INSERT / REPLACE / REMOVE PACEMAKER  02/05/2012   dual chamber, sinus node dysfunction, sinus arrest, PAF, Medtronic Revo serial#-[id number] H: last checked 05/2015  . LUMBAR LAMINECTOMY  1976   left L4-5  . PERMANENT PACEMAKER INSERTION N/A 02/05/2012   Procedure: PERMANENT PACEMAKER INSERTION;  Surgeon: [name], MD; Generator Medtronic [place] model [place] serial number [id number] H Laterality: N/A;  . SHOULDER SURGERY Left 2018   Left shoulder reverse TSA [name] in [place]  . TRANSTHORACIC ECHOCARDIOGRAM  08/25/10; 05/2012; 03/23/16   mild asymmetric LVH, normal systolic function, normal diastolic fxn, mild-to-mod mitral regurg, mild aortic valve sclerosis and trace AI, mild aortic root dilatation. 2014 f/u showed EF 40-45%, mod LAE, A FIB.  02/2016 EF 40%, diffuse hypokinesis, grade 2 DD.    Outpatient Medications Prior to Visit  Medication Sig Dispense Refill  . acetaminophen (TYLENOL) 325 MG tablet Take 2 tablets (650 mg total) by mouth every 6 (six) hours as needed for mild pain or moderate pain.    . COMBIGAN 0.2-0.5 % ophthalmic solution Place 1 drop into both eyes 2 (two) times daily.     [name] ENTRESTO 97-103 MG Take 1 tablet by mouth 2 (two) times daily. 180 tablet 1  . gabapentin (NEURONTIN) 300 MG capsule TAKE 1 CAPSULE THREE TIMES A DAY 270 capsule 3  . glucose blood test strip Use to check blood sugars 1-2 times daily 100 each 1  . JANUVIA 100 MG tablet TAKE 1 TABLET DAILY 90 tablet 0  . Lancets (PRECISION ULTRA LANCET) MISC Use as instructed 100 each 3  . latanoprost (XALATAN) 0.005 % ophthalmic solution Place 1 drop into both eyes at bedtime.   12  . meclizine (ANTIVERT) 25 MG tablet Take 25 mg by mouth 3 (three) times daily as needed for dizziness.    . meloxicam (MOBIC) 15 MG tablet Take 1 tablet (15 mg total) by mouth daily. 30 tablet 1  . metFORMIN (GLUCOPHAGE) 1000 MG tablet TAKE 1 TABLET TWICE A DAY 180 tablet 0  . metoprolol  succinate (TOPROL-XL) 100 MG 24 hr tablet 1 and 1/2 tabs po qd (Patient taking differently: Take 50-100 mg by mouth See admin instructions. Take 100 mg by mouth in the morning and take 50 mg by mouth at night) 135 tablet 3  . omeprazole (PRILOSEC) 40 MG capsule Take 1 capsule (40 mg total) by mouth daily. 90 capsule 0  . ONE TOUCH LANCETS MISC Use to check blood sugar 1-2 times daily 200 each 0  . rOPINIRole (REQUIP) 1 MG tablet TAKE 1 TABLET DAILY (NEED OFFICE VISIT FOR MORE REFILLS) (Patient taking differently: Take 1mg  at bedtime) 90 tablet 1  . sertraline (ZOLOFT) 100 MG tablet TAKE 1 TABLET DAILY 90 tablet 1  . simvastatin (ZOCOR) 20 MG tablet TAKE 1 TABLET EVERY EVENING 90 tablet 0  . XARELTO 20 MG TABS tablet TAKE 1 TABLET DAILY WITH SUPPER 90 tablet 1  .  colchicine 0.6 MG tablet Take 0.6 mg by mouth as needed (for gout).      No facility-administered medications prior to visit.     No Known Allergies  ROS As per HPI  PE: Blood pressure (!) 146/99, pulse 76, temperature 97.6 F (36.4 C), temperature source Oral, resp. rate 16, weight 225 lb (102.1 kg), SpO2 97 %. Gen: Alert, well appearing.  Patient is oriented to person, place, time, and situation. AFFECT: pleasant, lucid thought and speech. Left arm: distal aspect of L forearm dorsal surface has 2 small scabs where punctures healed. He has some ecchymoses around these sites.  No tenderness, swelling, or erythema.  LABS:  none  IMPRESSION AND PLAN:  Puncture wounds x 2, L arm.  Healed well, just some surrounding residual ecchymoses. Tetanus is UTD. No sign of infection. Reassured pt.  An After Visit Summary was printed and given to the patient.  FOLLOW UP: Return for keep appt set for 02/15/17.  Signed:  Crissie Sickles, MD           12/13/2016

## 2016-12-15 ENCOUNTER — Encounter: Payer: Self-pay | Admitting: *Deleted

## 2016-12-15 NOTE — Addendum Note (Signed)
Addended by: Stanton Kidney on: 12/15/2016 02:04 PM   Modules accepted: Orders

## 2016-12-15 NOTE — Telephone Encounter (Signed)
CRT-D scheduled for 6/14. Pre procedure labs 6/7. Wound check 6/25. 3 mo post implant OV 9/26. Letter of instructions reviewed with patient and sent through [place]. (also left a copy at front desk w/ surgical scrub) Patient verbalized understanding and agreeable to plan.

## 2016-12-20 DIAGNOSIS — M25512 Pain in left shoulder: Secondary | ICD-10-CM | POA: Diagnosis not present

## 2016-12-21 ENCOUNTER — Telehealth: Payer: Self-pay | Admitting: *Deleted

## 2016-12-21 NOTE — Telephone Encounter (Signed)
Recall letter placed in EPIC. Pt aware reminder letter to make an appt will be mailed to home address. He is agreeable to plan.

## 2016-12-21 NOTE — Telephone Encounter (Signed)
Croitoru, Dani Gobble, MD  Stanton Kidney, RN        Sounds good. Let Will have at him and I can see him in December.  MCr   Previous Messages    ----- Message -----  From: Stanton Kidney, RN  Sent: 12/15/2016  1:56 PM  To: [name], MD, Stanton Kidney, RN  Subject: Follow up with you??               Mr. Busche is a patient of yours and is scheduled to follow up with you on 8/2.   Pt is getting CRT-D upgrade, by [facility], on 6/14 w/ post procedure f/u on 9/26 (w/ Camnitz).   Do you still need to see the patient on 8/2 or do you want to push that further out? Let me know and I will inform patient.   Thx,  Trinidad Curet, RN  :)

## 2016-12-22 DIAGNOSIS — S61215D Laceration without foreign body of left ring finger without damage to nail, subsequent encounter: Secondary | ICD-10-CM | POA: Diagnosis not present

## 2016-12-22 DIAGNOSIS — S64495D Injury of digital nerve of left ring finger, subsequent encounter: Secondary | ICD-10-CM | POA: Diagnosis not present

## 2016-12-22 DIAGNOSIS — S66195D Other injury of flexor muscle, fascia and tendon of left ring finger at wrist and hand level, subsequent encounter: Secondary | ICD-10-CM | POA: Diagnosis not present

## 2016-12-22 NOTE — Addendum Note (Signed)
Addendum  created 12/22/16 1221 by Blondell Laperle, MD   Sign clinical note    

## 2016-12-28 ENCOUNTER — Other Ambulatory Visit (INDEPENDENT_AMBULATORY_CARE_PROVIDER_SITE_OTHER): Payer: Medicare Other

## 2016-12-28 DIAGNOSIS — S66195D Other injury of flexor muscle, fascia and tendon of left ring finger at wrist and hand level, subsequent encounter: Secondary | ICD-10-CM | POA: Diagnosis not present

## 2016-12-28 DIAGNOSIS — Z01812 Encounter for preprocedural laboratory examination: Secondary | ICD-10-CM | POA: Diagnosis not present

## 2016-12-28 DIAGNOSIS — S64495D Injury of digital nerve of left ring finger, subsequent encounter: Secondary | ICD-10-CM | POA: Diagnosis not present

## 2016-12-28 DIAGNOSIS — I4821 Permanent atrial fibrillation: Secondary | ICD-10-CM

## 2016-12-28 DIAGNOSIS — S61215D Laceration without foreign body of left ring finger without damage to nail, subsequent encounter: Secondary | ICD-10-CM | POA: Diagnosis not present

## 2016-12-28 DIAGNOSIS — I482 Chronic atrial fibrillation: Secondary | ICD-10-CM

## 2016-12-29 LAB — CBC WITH DIFFERENTIAL/PLATELET
Basophils Absolute: 0 10*3/uL (ref 0.0–0.2)
Basos: 0 %
EOS (ABSOLUTE): 0.2 10*3/uL (ref 0.0–0.4)
Eos: 3 %
Hematocrit: 44.8 % (ref 37.5–51.0)
Hemoglobin: 15.1 g/dL (ref 13.0–17.7)
Immature Grans (Abs): 0 10*3/uL (ref 0.0–0.1)
Immature Granulocytes: 0 %
Lymphocytes Absolute: 1.7 10*3/uL (ref 0.7–3.1)
Lymphs: 34 %
MCH: 29.1 pg (ref 26.6–33.0)
MCHC: 33.7 g/dL (ref 31.5–35.7)
MCV: 86 fL (ref 79–97)
Monocytes Absolute: 0.4 10*3/uL (ref 0.1–0.9)
Monocytes: 9 %
Neutrophils Absolute: 2.8 10*3/uL (ref 1.4–7.0)
Neutrophils: 54 %
Platelets: 204 10*3/uL (ref 150–379)
RBC: 5.19 x10E6/uL (ref 4.14–5.80)
RDW: 15.2 % (ref 12.3–15.4)
WBC: 5.2 10*3/uL (ref 3.4–10.8)

## 2016-12-29 LAB — BASIC METABOLIC PANEL
BUN/Creatinine Ratio: 19 (ref 10–24)
BUN: 25 mg/dL (ref 8–27)
CO2: 23 mmol/L (ref 18–29)
Calcium: 9.9 mg/dL (ref 8.6–10.2)
Chloride: 98 mmol/L (ref 96–106)
Creatinine, Ser: 1.34 mg/dL — ABNORMAL HIGH (ref 0.76–1.27)
GFR calc Af Amer: 60 mL/min/{1.73_m2} (ref >59)
GFR calc non Af Amer: 52 mL/min/{1.73_m2} — ABNORMAL LOW (ref >59)
Glucose: 96 mg/dL (ref 65–99)
Potassium: 5 mmol/L (ref 3.5–5.2)
Sodium: 139 mmol/L (ref 134–144)

## 2017-01-01 ENCOUNTER — Other Ambulatory Visit: Payer: Self-pay | Admitting: Cardiology

## 2017-01-01 ENCOUNTER — Telehealth: Payer: Self-pay | Admitting: Cardiology

## 2017-01-01 DIAGNOSIS — I5022 Chronic systolic (congestive) heart failure: Secondary | ICD-10-CM

## 2017-01-01 NOTE — Telephone Encounter (Signed)
Reviewed questions/instructions. Verbally confirmed understanding from patient. He thanks me for calling back.

## 2017-01-01 NOTE — Telephone Encounter (Signed)
New message    Pt is calling with questions about having his device implanted this week on Thursday. His questions are about what medications he should stop and wants to confirm his appt.

## 2017-01-01 NOTE — Telephone Encounter (Signed)
Left detailed message informing patient of medication instructions and what time to arrive to hospital. Advised him to call back to confirm instructions if needed.

## 2017-01-04 ENCOUNTER — Ambulatory Visit (HOSPITAL_COMMUNITY)
Admission: RE | Admit: 2017-01-04 | Discharge: 2017-01-05 | Disposition: A | Discharge status: Home / Self Care | Payer: Medicare Other | Source: Ambulatory Visit | Attending: Cardiology | Admitting: Cardiology

## 2017-01-04 ENCOUNTER — Encounter (HOSPITAL_COMMUNITY)
Admission: RE | Disposition: A | Discharge status: Home / Self Care | Payer: Self-pay | Source: Ambulatory Visit | Attending: Cardiology

## 2017-01-04 ENCOUNTER — Encounter (HOSPITAL_COMMUNITY): Payer: Self-pay | Admitting: General Practice

## 2017-01-04 DIAGNOSIS — N183 Chronic kidney disease, stage 3 (moderate): Secondary | ICD-10-CM | POA: Diagnosis not present

## 2017-01-04 DIAGNOSIS — Z7984 Long term (current) use of oral hypoglycemic drugs: Secondary | ICD-10-CM | POA: Insufficient documentation

## 2017-01-04 DIAGNOSIS — M4726 Other spondylosis with radiculopathy, lumbar region: Secondary | ICD-10-CM | POA: Diagnosis not present

## 2017-01-04 DIAGNOSIS — I5022 Chronic systolic (congestive) heart failure: Secondary | ICD-10-CM | POA: Diagnosis not present

## 2017-01-04 DIAGNOSIS — I447 Left bundle-branch block, unspecified: Secondary | ICD-10-CM | POA: Insufficient documentation

## 2017-01-04 DIAGNOSIS — I509 Heart failure, unspecified: Secondary | ICD-10-CM | POA: Diagnosis not present

## 2017-01-04 DIAGNOSIS — I442 Atrioventricular block, complete: Secondary | ICD-10-CM | POA: Insufficient documentation

## 2017-01-04 DIAGNOSIS — I2510 Atherosclerotic heart disease of native coronary artery without angina pectoris: Secondary | ICD-10-CM | POA: Insufficient documentation

## 2017-01-04 DIAGNOSIS — E1122 Type 2 diabetes mellitus with diabetic chronic kidney disease: Secondary | ICD-10-CM | POA: Diagnosis not present

## 2017-01-04 DIAGNOSIS — I482 Chronic atrial fibrillation: Secondary | ICD-10-CM | POA: Insufficient documentation

## 2017-01-04 DIAGNOSIS — K219 Gastro-esophageal reflux disease without esophagitis: Secondary | ICD-10-CM | POA: Insufficient documentation

## 2017-01-04 DIAGNOSIS — G8929 Other chronic pain: Secondary | ICD-10-CM | POA: Diagnosis not present

## 2017-01-04 DIAGNOSIS — I428 Other cardiomyopathies: Secondary | ICD-10-CM | POA: Diagnosis not present

## 2017-01-04 DIAGNOSIS — Z95818 Presence of other cardiac implants and grafts: Secondary | ICD-10-CM

## 2017-01-04 DIAGNOSIS — F329 Major depressive disorder, single episode, unspecified: Secondary | ICD-10-CM | POA: Diagnosis not present

## 2017-01-04 DIAGNOSIS — N400 Enlarged prostate without lower urinary tract symptoms: Secondary | ICD-10-CM | POA: Diagnosis not present

## 2017-01-04 DIAGNOSIS — M1990 Unspecified osteoarthritis, unspecified site: Secondary | ICD-10-CM | POA: Insufficient documentation

## 2017-01-04 DIAGNOSIS — I5042 Chronic combined systolic (congestive) and diastolic (congestive) heart failure: Secondary | ICD-10-CM | POA: Insufficient documentation

## 2017-01-04 DIAGNOSIS — Z7901 Long term (current) use of anticoagulants: Secondary | ICD-10-CM | POA: Insufficient documentation

## 2017-01-04 DIAGNOSIS — I429 Cardiomyopathy, unspecified: Secondary | ICD-10-CM | POA: Diagnosis not present

## 2017-01-04 DIAGNOSIS — I130 Hypertensive heart and chronic kidney disease with heart failure and stage 1 through stage 4 chronic kidney disease, or unspecified chronic kidney disease: Secondary | ICD-10-CM | POA: Diagnosis not present

## 2017-01-04 DIAGNOSIS — Z950 Presence of cardiac pacemaker: Secondary | ICD-10-CM | POA: Diagnosis not present

## 2017-01-04 HISTORY — DX: Type 2 diabetes mellitus without complications: E11.9

## 2017-01-04 HISTORY — PX: BIV ICD INSERTION CRT-D: EP1195

## 2017-01-04 HISTORY — DX: Unspecified malignant neoplasm of skin of left upper limb, including shoulder: C44.609

## 2017-01-04 HISTORY — DX: Personal history of urinary calculi: Z87.442

## 2017-01-04 HISTORY — DX: Acute myocardial infarction, unspecified: I21.9

## 2017-01-04 HISTORY — DX: Migraine, unspecified, not intractable, without status migrainosus: G43.909

## 2017-01-04 HISTORY — PX: PACEMAKER REMOVAL: SHX5066

## 2017-01-04 HISTORY — DX: Presence of automatic (implantable) cardiac defibrillator: Z95.810

## 2017-01-04 LAB — GLUCOSE, CAPILLARY
Glucose-Capillary: 175 mg/dL — ABNORMAL HIGH (ref 65–99)
Glucose-Capillary: 132 mg/dL — ABNORMAL HIGH (ref 65–99)
Glucose-Capillary: 138 mg/dL — ABNORMAL HIGH (ref 65–99)
Glucose-Capillary: 248 mg/dL — ABNORMAL HIGH (ref 65–99)

## 2017-01-04 LAB — SURGICAL PCR SCREEN
MRSA, PCR: NEGATIVE
Staphylococcus aureus: NEGATIVE

## 2017-01-04 SURGERY — BIV ICD INSERTION CRT-D
Anesthesia: LOCAL

## 2017-01-04 MED ORDER — HEPARIN (PORCINE) IN NACL 2-0.9 UNIT/ML-% IJ SOLN
INTRAMUSCULAR | Status: AC
  Filled 2017-01-04: qty 500

## 2017-01-04 MED ORDER — FENTANYL CITRATE (PF) 100 MCG/2ML IJ SOLN
25.0000 ug | Freq: Once | INTRAMUSCULAR | Status: AC
  Administered 2017-01-04: 25 ug via INTRAVENOUS

## 2017-01-04 MED ORDER — GABAPENTIN 300 MG PO CAPS
300.0000 mg | ORAL_CAPSULE | Freq: Three times a day (TID) | ORAL | Status: DC
  Administered 2017-01-04: 19:00:00 300 mg via ORAL
  Filled 2017-01-04: qty 1

## 2017-01-04 MED ORDER — COLCHICINE 0.6 MG PO TABS: 0.6000 mg | ORAL_TABLET | Freq: Every day | ORAL | Status: DC | PRN

## 2017-01-04 MED ORDER — FENTANYL CITRATE (PF) 100 MCG/2ML IJ SOLN
INTRAMUSCULAR | Status: AC
  Filled 2017-01-04: qty 2

## 2017-01-04 MED ORDER — LIDOCAINE HCL (PF) 1 % IJ SOLN
INTRAMUSCULAR | Status: DC | PRN
  Administered 2017-01-04: 49 mL

## 2017-01-04 MED ORDER — RIVAROXABAN 15 MG PO TABS
15.0000 mg | ORAL_TABLET | Freq: Every day | ORAL | Status: DC
  Administered 2017-01-04: 15 mg via ORAL
  Filled 2017-01-04: qty 1

## 2017-01-04 MED ORDER — ONDANSETRON HCL 4 MG/2ML IJ SOLN: 4.0000 mg | Freq: Four times a day (QID) | INTRAMUSCULAR | Status: DC | PRN

## 2017-01-04 MED ORDER — PANTOPRAZOLE SODIUM 40 MG PO TBEC
40.0000 mg | DELAYED_RELEASE_TABLET | Freq: Every day | ORAL | Status: DC
  Filled 2017-01-04: qty 1

## 2017-01-04 MED ORDER — IOPAMIDOL (ISOVUE-370) INJECTION 76%
INTRAVENOUS | Status: DC | PRN
  Administered 2017-01-04: 15 mL via INTRAVENOUS
  Administered 2017-01-04: 5 mL via INTRAVENOUS
  Administered 2017-01-04: 15 mL via INTRAVENOUS

## 2017-01-04 MED ORDER — MIDAZOLAM HCL 5 MG/5ML IJ SOLN
INTRAMUSCULAR | Status: AC
  Filled 2017-01-04: qty 5

## 2017-01-04 MED ORDER — IOPAMIDOL (ISOVUE-370) INJECTION 76%
INTRAVENOUS | Status: AC
  Filled 2017-01-04: qty 50

## 2017-01-04 MED ORDER — SERTRALINE HCL 100 MG PO TABS
100.0000 mg | ORAL_TABLET | Freq: Every day | ORAL | Status: DC
  Filled 2017-01-04: qty 1
  Filled 2017-01-04: qty 2

## 2017-01-04 MED ORDER — SODIUM CHLORIDE 0.9 % IV SOLN
INTRAVENOUS | Status: DC
  Administered 2017-01-04: 10:00:00 via INTRAVENOUS

## 2017-01-04 MED ORDER — CHLORHEXIDINE GLUCONATE 4 % EX LIQD
60.0000 mL | Freq: Once | CUTANEOUS | Status: DC
  Filled 2017-01-04: qty 60

## 2017-01-04 MED ORDER — FENTANYL CITRATE (PF) 100 MCG/2ML IJ SOLN
INTRAMUSCULAR | Status: DC | PRN
  Administered 2017-01-04: 25 ug via INTRAVENOUS
  Administered 2017-01-04 (×2): 12.5 ug via INTRAVENOUS
  Administered 2017-01-04: 25 ug via INTRAVENOUS
  Administered 2017-01-04 (×2): 12.5 ug via INTRAVENOUS

## 2017-01-04 MED ORDER — SACUBITRIL-VALSARTAN 97-103 MG PO TABS
1.0000 | ORAL_TABLET | Freq: Two times a day (BID) | ORAL | Status: DC
  Administered 2017-01-04: 1 via ORAL
  Filled 2017-01-04 (×2): qty 1

## 2017-01-04 MED ORDER — LINAGLIPTIN 5 MG PO TABS: 5.0000 mg | ORAL_TABLET | Freq: Every day | ORAL | Status: DC

## 2017-01-04 MED ORDER — GLIPIZIDE ER 5 MG PO TB24: 5.0000 mg | ORAL_TABLET | Freq: Every day | ORAL | Status: DC

## 2017-01-04 MED ORDER — MIDAZOLAM HCL 5 MG/5ML IJ SOLN
INTRAMUSCULAR | Status: DC | PRN
  Administered 2017-01-04: 1 mg via INTRAVENOUS
  Administered 2017-01-04: 2 mg via INTRAVENOUS
  Administered 2017-01-04 (×4): 1 mg via INTRAVENOUS

## 2017-01-04 MED ORDER — BRIMONIDINE TARTRATE-TIMOLOL 0.2-0.5 % OP SOLN
1.0000 [drp] | Freq: Two times a day (BID) | OPHTHALMIC | Status: DC
  Filled 2017-01-04: qty 5

## 2017-01-04 MED ORDER — ROPINIROLE HCL 1 MG PO TABS
1.0000 mg | ORAL_TABLET | Freq: Every day | ORAL | Status: DC
  Administered 2017-01-04: 1 mg via ORAL
  Filled 2017-01-04: qty 1

## 2017-01-04 MED ORDER — INSULIN ASPART 100 UNIT/ML ~~LOC~~ SOLN
0.0000 [IU] | SUBCUTANEOUS | Status: DC
  Administered 2017-01-04: 18:00:00 2 [IU] via SUBCUTANEOUS

## 2017-01-04 MED ORDER — HEPARIN (PORCINE) IN NACL 2-0.9 UNIT/ML-% IJ SOLN
INTRAMUSCULAR | Status: AC | PRN
  Administered 2017-01-04: 500 mL

## 2017-01-04 MED ORDER — LATANOPROST 0.005 % OP SOLN
1.0000 [drp] | Freq: Every day | OPHTHALMIC | Status: DC
  Filled 2017-01-04: qty 2.5

## 2017-01-04 MED ORDER — CEFAZOLIN SODIUM-DEXTROSE 2-4 GM/100ML-% IV SOLN
2.0000 g | INTRAVENOUS | Status: AC
  Administered 2017-01-04: 2 g via INTRAVENOUS

## 2017-01-04 MED ORDER — ACETAMINOPHEN 325 MG PO TABS
325.0000 mg | ORAL_TABLET | ORAL | Status: DC | PRN
Start: 2017-01-04 — End: 2017-01-05
  Filled 2017-01-04 (×2): qty 2

## 2017-01-04 MED ORDER — SIMVASTATIN 20 MG PO TABS
20.0000 mg | ORAL_TABLET | Freq: Every evening | ORAL | Status: DC
  Administered 2017-01-04: 19:00:00 20 mg via ORAL
  Filled 2017-01-04: qty 1

## 2017-01-04 MED ORDER — METOPROLOL SUCCINATE ER 50 MG PO TB24: 50.0000 mg | ORAL_TABLET | Freq: Every day | ORAL | Status: DC

## 2017-01-04 MED ORDER — MECLIZINE HCL 25 MG PO TABS: 25.0000 mg | ORAL_TABLET | Freq: Three times a day (TID) | ORAL | Status: DC | PRN

## 2017-01-04 MED ORDER — CEFAZOLIN SODIUM-DEXTROSE 1-4 GM/50ML-% IV SOLN
1.0000 g | Freq: Four times a day (QID) | INTRAVENOUS | Status: AC
  Administered 2017-01-04 – 2017-01-05 (×3): 1 g via INTRAVENOUS
  Filled 2017-01-04 (×3): qty 50

## 2017-01-04 MED ORDER — ACETAMINOPHEN 325 MG PO TABS
650.0000 mg | ORAL_TABLET | Freq: Four times a day (QID) | ORAL | Status: DC | PRN
  Administered 2017-01-04 – 2017-01-05 (×2): 650 mg via ORAL

## 2017-01-04 MED ORDER — LIDOCAINE HCL (PF) 1 % IJ SOLN
INTRAMUSCULAR | Status: AC
  Filled 2017-01-04: qty 60

## 2017-01-04 MED ORDER — CEFAZOLIN SODIUM-DEXTROSE 2-4 GM/100ML-% IV SOLN
INTRAVENOUS | Status: AC
  Filled 2017-01-04: qty 100

## 2017-01-04 MED ORDER — METOPROLOL SUCCINATE ER 100 MG PO TB24: 100.0000 mg | ORAL_TABLET | ORAL | Status: DC

## 2017-01-04 MED ORDER — SODIUM CHLORIDE 0.9 % IR SOLN
80.0000 mg | Status: AC
  Administered 2017-01-04: 80 mg

## 2017-01-04 MED ORDER — SODIUM CHLORIDE 0.9 % IR SOLN
Status: AC
  Filled 2017-01-04: qty 2

## 2017-01-04 MED ORDER — METOPROLOL SUCCINATE ER 50 MG PO TB24: 100.0000 mg | ORAL_TABLET | Freq: Every day | ORAL | Status: DC

## 2017-01-04 MED ORDER — METFORMIN HCL 500 MG PO TABS
1000.0000 mg | ORAL_TABLET | Freq: Two times a day (BID) | ORAL | Status: DC
Start: 2017-01-05 — End: 2017-01-05

## 2017-01-04 MED ORDER — MUPIROCIN 2 % EX OINT
TOPICAL_OINTMENT | CUTANEOUS | Status: AC
  Administered 2017-01-04: 1
  Filled 2017-01-04: qty 22

## 2017-01-04 MED ORDER — INSULIN ASPART 100 UNIT/ML ~~LOC~~ SOLN
0.0000 [IU] | Freq: Three times a day (TID) | SUBCUTANEOUS | Status: DC
  Administered 2017-01-04: 5 [IU] via SUBCUTANEOUS

## 2017-01-04 SURGICAL SUPPLY — 20 items
BALLN ATTAIN 80 (BALLOONS) ×2
BALLOON ATTAIN 80 (BALLOONS) IMPLANT
CABLE SURGICAL S-101-97-12 (CABLE) ×1 IMPLANT
CATH ATTAIN COM SURV 6250V-EH (CATHETERS) ×1 IMPLANT
CATH ATTAIN COM SURV 6250V-MB2 (CATHETERS) ×1 IMPLANT
CATH HEX JOSEPH 2-5-2 65CM 6F (CATHETERS) ×1 IMPLANT
DEVICE DISSECT PLASMABLAD 3.0S (MISCELLANEOUS) IMPLANT
ICD CLARIA MRI DTMA1QQ (ICD Generator) ×1 IMPLANT
KIT ESSENTIALS PG (KITS) ×1 IMPLANT
LEAD ATTAIN PERFORMA S 4598-88 (Lead) ×1 IMPLANT
LEAD SPRINT QUAT SEC 6935M-62 (Lead) ×1 IMPLANT
PAD DEFIB LIFELINK (PAD) ×1 IMPLANT
PLASMABLADE 3.0S (MISCELLANEOUS) ×2
SHEATH CLASSIC 9.5F (SHEATH) ×1 IMPLANT
SHEATH CLASSIC 9F (SHEATH) ×1 IMPLANT
SLITTER 6230UNI (MISCELLANEOUS) ×1 IMPLANT
TRAY PACEMAKER INSERTION (PACKS) ×1 IMPLANT
WIRE ACUITY WHISPER EDS 4648 (WIRE) ×1 IMPLANT
WIRE HI TORQ VERSACORE-J 145CM (WIRE) ×1 IMPLANT
WIRE MAILMAN 182CM (WIRE) ×1 IMPLANT

## 2017-01-04 NOTE — Discharge Instructions (Signed)
° ° °  Supplemental Discharge Instructions for  Pacemaker/Defibrillator Patients  Activity No heavy lifting or vigorous activity with your left/right arm for 6 to 8 weeks.  Do not raise your left/right arm above your head for one week.  Gradually raise your affected arm as drawn below.             01/08/17                    01/09/17                     01/10/17                  01/11/17 __  NO DRIVING for  1 week   ; you may begin driving on  10/24/45  .  WOUND CARE - Keep the wound area clean and dry.  Do not get this area wet, no showers until cleared at your wound check visit - The tape/steri-strips on your wound will fall off; do not pull them off.  No bandage is needed on the site.  DO  NOT apply any creams, oils, or ointments to the wound area. - If you notice any drainage or discharge from the wound, any swelling or bruising at the site, or you develop a fever > 101? F after you are discharged home, call the office at once.  Special Instructions - You are still able to use cellular telephones; use the ear opposite the side where you have your pacemaker/defibrillator.  Avoid carrying your cellular phone near your device. - When traveling through airports, show security personnel your identification card to avoid being screened in the metal detectors.  Ask the security personnel to use the hand wand. - Avoid arc welding equipment, MRI testing (magnetic resonance imaging), TENS units (transcutaneous nerve stimulators).  Call the office for questions about other devices. - Avoid electrical appliances that are in poor condition or are not properly grounded. - Microwave ovens are safe to be near or to operate.  Additional information for defibrillator patients should your device go off: - If your device goes off ONCE and you feel fine afterward, notify the device clinic nurses. - If your device goes off ONCE and you do not feel well afterward, call 911. - If your device goes off TWICE, call  911. - If your device goes off THREE times in one day, call 911.  DO NOT DRIVE YOURSELF OR A FAMILY MEMBER WITH A DEFIBRILLATOR TO THE HOSPITAL--CALL 911.

## 2017-01-04 NOTE — H&P (Signed)
ID:  Mason [name], DOB [birth year], MRN [id number]  PCP:  Mason Sou, MD       Cardiologist:  Mason [name] Primary Electrophysiologist:  Mason Meredith Leeds, MD           Chief Complaint  Patient presents with  . Pacemaker Check    Chronic systolic heart failure/Complete heart block     History of Present Illness: Mason [name] is a 76 y.o. male who presents today for electrophysiology evaluation.   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 He showed a gradual decline in LV systolic function and has recently developed exertional dyspnea and heart failure symptoms. An echocardiogram in August showed an EF of 40% with diffuse hypokinesis. In October he had a nuclear stress test that showed all perfusion but an EF of 32%. Has minor coronary atherosclerosis and normal LV EDP. He had a dual chamber Medtronic Revo pacemaker implanted July 2013. He is programmed VVIR for permanent atrial fibrillation. Currently takes rivaroxaban for anticoagulation. He is feeling well with only fatigue and minimal amounts of shortness of breath.  Today, denies symptoms of palpitations, chest pain, shortness of breath, orthopnea, PND, lower extremity edema, claudication, dizziness, presyncope, syncope, bleeding, or neurologic sequela. The patient is tolerating medications without difficulties and is otherwise without complaint today.        Past Medical History:  Diagnosis Date  . Arthritis    left shoulder (Dr. Sheral [name]): steroid injection 04/03/16.  Plan for TSA surgery 06/2016.  [name] BPH (benign prostatic hypertrophy) 06/2011   Irritative sx's; pt declined trial of anticholinergic per Urology records  . Chronic combined systolic and diastolic heart failure ([place]) 05/31/2012   Nonischemic:  EF 40-45%, LA mod-severe dilated, AFIB.   02/2016 EF 40%, diffuse hypokinesis, grade 2 DD.  Myoc perf imaging showed EF 32% 04/2016.  Pt to get upgrade to CRT-D as of 07/2016.  [name] Chronic low  back pain   . Chronic renal insufficiency, stage III (moderate) 2015   CrCl about 60 ml/min  . Depression   . Diabetes mellitus   . DOE (dyspnea on exertion)    NYHA class I CHF  . Episodic low back pain 01/22/2013   w/intermittent radiculitis (12/2014 his neurologist referred him to pain mgmt for epidural steroid injection)  . GERD (gastroesophageal reflux disease)   . H/O tilt table evaluation 11/02/05   negative  . Helicobacter pylori gastritis 01/2016  . History of adenomatous polyp of colon 10/12/11   Dr. Benson [name] (3 right side of colon- tubular adenomas removed)  . History of cardiovascular stress test 05/28/12   no ischemia, EF 37%, imaging results are unchanged and within normal variance  . History of chronic prostatitis   . History of retinal detachment    OS  . History of vertigo    + Hx of posterior HA's.  Neuro (Mason [name]) eval 2011.  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  . Hypertension   . Lumbar spondylosis    lumbosacral radiculopathy at L4 by EMG testing, right foot drop (neurologist is Dr. Linus [name] with [facility] in [place])--neurologist referred him to neurosurgery  . Nephrolithiasis 07/2012   Left UVJ 2 mm stone with dilation of renal collecting system and slight hydroureter on right  . 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  Since no CAD on cath 05/2016, cards recommends upgrade to CRT-D.  [name] Permanent atrial fibrillation ([place])  DCCV 07/09/13-converted, lasted two days, then back into afib--needs lifetime anticoagulation (Xarelto as of 09/2014)        Past Surgical History:  Procedure Laterality Date  . CARDIAC CATHETERIZATION N/A 06/14/2016   Minimal nonobstructive dz, EF 25-35%.  Procedure: Left Heart Cath and Coronary Angiography;  Surgeon: Mason M Martinique, MD;  Location: [facility] [facility];   Service: Cardiovascular;  Laterality: N/A;  . CARDIOVASCULAR STRESS TEST  2012   2012 nuclear perfusion study: low risk scan; 04/2016 normal myocardial perfusion imaging, EF 32%.  [name] CARDIOVERSION  07/09/2012   Procedure: CARDIOVERSION;  Surgeon: Mason Klein, MD;  Location: [facility];  Service: Cardiovascular;  Laterality: N/A;  . COLONOSCOPY W/ POLYPECTOMY  approx 2006; repeated 09/2011   Polyps on 2013 EGD as well, repeat 12/2014  . ESOPHAGOGASTRODUODENOSCOPY  10/18/06   Done due to chronic GERD: Normal, bx showed no barrett's esophagus (Dr. Benson [name])  . EYE SURGERY     retinal detachment OS+ cataract surgery  . INSERT / REPLACE / REMOVE PACEMAKER  02/05/2012   dual chamber, sinus node dysfunction, sinus arrest, PAF, Medtronic Revo serial#-[id number] H: last checked 05/2015  . LUMBAR LAMINECTOMY  1976   left L4-5  . PERMANENT PACEMAKER INSERTION N/A 02/05/2012   Procedure: PERMANENT PACEMAKER INSERTION;  Surgeon: Mason Klein, MD; Generator Medtronic Revo model [place] serial number [id number] H Laterality: N/A;  . SHOULDER SURGERY    . TRANSTHORACIC ECHOCARDIOGRAM  08/25/10; 05/2012; 03/23/16   mild asymmetric LVH, normal systolic function, normal diastolic fxn, mild-to-mod mitral regurg, mild aortic valve sclerosis and trace AI, mild aortic root dilatation. 2014 f/u showed EF 40-45%, mod LAE, A FIB.  02/2016 EF 40%, diffuse hypokinesis, grade 2 DD.           Current Outpatient Prescriptions  Medication Sig Dispense Refill  . blood glucose meter kit and supplies KIT Use to check blood sugar 1 to 2 times daily 1 each 0  . colchicine 0.6 MG tablet Take 0.6 mg by mouth as needed.    . COMBIGAN 0.2-0.5 % ophthalmic solution Place 1 drop into both eyes 2 (two) times daily.     [name] gabapentin (NEURONTIN) 300 MG capsule TAKE 1 CAPSULE THREE TIMES A DAY 270 capsule 3  . GLIPIZIDE XL 5 MG 24 hr tablet TAKE 1 TABLET EVERY MORNING 90 tablet 1  . glucose blood test strip Use as  instructed 100 each 3  . glucose blood test strip Use to check blood sugars 1-2 times daily 100 each 1  . JANUVIA 100 MG tablet TAKE 1 TABLET DAILY 90 tablet 1  . Lancets (PRECISION ULTRA LANCET) MISC Use as instructed 100 each 3  . latanoprost (XALATAN) 0.005 % ophthalmic solution Place 1 drop into both eyes at bedtime.   12  . losartan (COZAAR) 100 MG tablet Take 100 mg by mouth daily.    . metFORMIN (GLUCOPHAGE) 1000 MG tablet Take 1,000 mg by mouth 2 (two) times daily with a meal.     . metoprolol succinate (TOPROL-XL) 100 MG 24 hr tablet 1 and 1/2 tabs po qd 135 tablet 3  . omeprazole (PRILOSEC) 40 MG capsule Take 1 capsule (40 mg total) by mouth daily. (Patient taking differently: Take 40 mg by mouth daily as needed (indigestion). ) 90 capsule 3  . ONE TOUCH LANCETS MISC Use to check blood sugar 1-2 times daily 200 each 0  . rOPINIRole (REQUIP) 1 MG tablet TAKE 1 TABLET DAILY (NEED OFFICE VISIT FOR MORE REFILLS) (Patient taking differently: Take  1m at bedtime) 90 tablet 1  . sertraline (ZOLOFT) 100 MG tablet TAKE 1 TABLET DAILY 90 tablet 1  . simvastatin (ZOCOR) 20 MG tablet TAKE 1 TABLET EVERY EVENING 90 tablet 1  . XARELTO 20 MG TABS tablet TAKE 1 TABLET DAILY WITH SUPPER 90 tablet 1   No current facility-administered medications for this visit.     Allergies:   Patient has no known allergies.   Social History:  The patient  reports that he has never smoked. He has never used smokeless tobacco. He reports that he drinks about 7.2 oz of alcohol per week . He reports that he does not use drugs.   Family History:  The patient's family history includes Heart failure in his mother; Stroke in his father and mother.    ROS:  Please see the history of present illness.   Otherwise, review of systems is positive for none.   All other systems are reviewed and negative.     PHYSICAL EXAM: VS:  BP (!) 143/92 (BP Location: Right Arm)   Pulse 75   Temp 97.6 F (36.4 C) (Oral)    Ht 5' 11" (1.803 m)   Wt 225 lb (102.1 kg)   SpO2 98%   BMI 31.38 kg/m  , BMI Body mass index is 31.38 kg/m. GEN: Well nourished, well developed, in no acute distress  HEENT: normal  Neck: no JVD, carotid bruits, or masses Cardiac: RRR; no murmurs, rubs, or gallops,no edema  Respiratory:  clear to auscultation bilaterally, normal work of breathing GI: soft, nontender, nondistended, + BS MS: no deformity or atrophy  Skin: warm and dry, device site well healed Neuro:  Strength and sensation are intact Psych: euthymic mood, full affect   Recent Labs: 02/21/2016: ALT 17 06/06/2016: BUN 16; Creat 1.21; Hemoglobin 14.3; Platelets 177; Potassium 4.4; Sodium 137; TSH 3.37    Lipid Panel  Labs(Brief)          Component Value Date/Time   CHOL 148 07/03/2016 0832   TRIG 172.0 (H) 07/03/2016 0832   HDL 41.40 07/03/2016 0832   CHOLHDL 4 07/03/2016 0832   VLDL 34.4 07/03/2016 0832   LDLCALC 72 07/03/2016 0832          Wt Readings from Last 3 Encounters:  08/25/16 225 lb (102.1 kg)  08/04/16 231 lb (104.8 kg)  07/31/16 225 lb (102.1 kg)      Other studies Reviewed: Additional studies/ records that were reviewed today include: Cardiac cath 06/14/16, TTE 03/23/16  Review of the above records today demonstrates:   There is severe left ventricular systolic dysfunction.  The left ventricular ejection fraction is 25-35% by visual estimate.  LV end diastolic pressure is normal.  1. Minimal nonobstructive CAD 2. Severe LV dysfunction 3. Normal LV EDP  - Left ventricle: The cavity size was normal. Wall thickness was increased in a pattern of mild LVH. The estimated ejection fraction was 40%. Diffuse hypokinesis. Features are consistent with a pseudonormal left ventricular filling pattern, with concomitant abnormal relaxation and increased filling pressure (grade 2 diastolic dysfunction). - Aortic valve: There was no stenosis. - Aorta: Ascending  aortic diameter: 43 mm (S). - Ascending aorta: The ascending aorta was mildly dilated. - Mitral valve: There was trivial regurgitation. - Left atrium: The atrium was mildly dilated. - Right ventricle: The cavity size was normal. Pacer wire or catheter noted in right ventricle. Systolic function was mildly reduced. - Right atrium: The atrium was mildly dilated. - Tricuspid valve: Peak RV-RA gradient (  S): 26 mm Hg. - Pulmonary arteries: PA peak pressure: 34 mm Hg (S). - Systemic veins: IVC measured 2.0 cm with < 50% respirophasic variation, suggesting RA pressure 8 mmHg.   ASSESSMENT AND PLAN:  1.  Permanent atrial fibrillation: on Xarelto  This patients CHA2DS2-VASc Score and unadjusted Ischemic Stroke Rate (% per year) is equal to 4.8 % stroke rate/year from a score of 4  Above score calculated as 1 point each if present [CHF, HTN, DM, Vascular=MI/PAD/Aortic Plaque, Age if 65-74, or Male] Above score calculated as 2 points each if present [Age > 75, or Stroke/TIA/TE]    2. Systolic heart failure: EF has decraeased as he has become 100% paced. Plan for CRT-D implant today. Risks and benefits discussed. Risks include but not limited to bleeding, infection, tamponade, pneumothorax. He understands the risks and has agreed to the procedure.   Signed, Mason Meredith Leeds, MD  08/25/2016 11:19 AM

## 2017-01-04 NOTE — Care Management Note (Signed)
Case Management Note  Patient Details  Name: DENHAM MOSE MRN: [id number] Date of Birth: 1940/11/22  Subjective/Objective:  From home with wife, pta indep, s/p BIV implant, He has medicare and [employer].    PCP Shawnie Dapper                 Action/Plan: NCM will follow for dc needs.   Expected Discharge Date:                  Expected Discharge Plan:  Home/Self Care  In-House Referral:     Discharge planning Services  CM Consult  Post Acute Care Choice:    Choice offered to:     DME Arranged:    DME Agency:     HH Arranged:    HH Agency:     Status of Service:  In process, will continue to follow  If discussed at Long Length of Stay Meetings, dates discussed:    Additional Comments:  Zenon Mayo, RN 01/04/2017, 5:12 PM

## 2017-01-04 NOTE — Discharge Summary (Signed)
ELECTROPHYSIOLOGY PROCEDURE DISCHARGE SUMMARY    Patient ID: Mason [name],  MRN: [id number], DOB/AGE: [birth year] 89 y.o.  Admit date: 01/04/2017 Discharge date: 01/05/17  Primary Care Physician: Tammi Sou, MD  Primary Cardiologist: Dr. Sallyanne Kuster Electrophysiologist: Dr. Curt Bears  Primary Discharge Diagnosis:  1. NICM  Secondary Discharge Diagnosis:  1. Permanent AFib     CHA2DS2Vasc is at least 4 on Xarelto 2. Chronic CHF (combined) 3. CRI (III) 4. DM 5. HTN  No Known Allergies   Procedures This Admission:  1.  Implantation of a MDT CRT-ICD on 01/04/17 by Dr Curt Bears.  The patient received a Medtronic model [id number] [place] MRI Quad CRT-D SureScan (serial Number [id number] ) device, Medtronic model [id number] (serial number [id number]) right ventricular defibrillator lead, Medtronic model 4598 - 88 (serial number [id number] ) lead  DFT's were deferred at time of implant.  There were no immediate post procedure complications. 2.  CXR on 01/05/17 demonstrated no pneumothorax status post device implantation.   Brief HPI: Mason [name] is a 76 y.o. male was referred to electrophysiology in the outpatient setting for consideration of ICD implantation.  Past medical history includes NICM, chronic CHF, HTN, DM, CRI.  The patient has persistent LV dysfunction despite guideline directed therapy.  Risks, benefits, and alternatives to ICD implantation were reviewed with the patient who wished to proceed.   Hospital Course:  The patient was admitted and underwent implantation of an CRT-D with details as outlined above. He was monitored on telemetry overnight which demonstrated AFib/V paced.  Left chest was without hematoma or ecchymosis.  The device was interrogated and found to be functioning normally.  CXR was obtained and demonstrated no pneumothorax status post device implantation.  Wound care, arm mobility, and restrictions were reviewed with the patient.  The patient was examined by  Dr. Curt Bears and considered stable for discharge to home.   The patient's discharge medications include an ARB (Antresto) and beta blocker (metoprolol).   Physical Exam: Vitals:   01/04/17 2325 01/05/17 0118 01/05/17 0350 01/05/17 0730  BP: (!) 150/93 (!) 146/82 134/77 (!) 141/87  Pulse: 70 70 70 70  Resp:  11 16 20   Temp: 98 F (36.7 C)  97.8 F (36.6 C) 97.4 F (36.3 C)  TempSrc: Oral  Oral Oral  SpO2: 98% 97% 98% 98%  Weight:  231 lb 14.8 oz (105.2 kg)    Height:        GEN- The patient is well appearing, alert and oriented x 3 today.   HEENT: normocephalic, atraumatic; sclera clear, conjunctiva pink; hearing intact; oropharynx clear Lungs- CTA b/l, normal work of breathing.  No wheezes, rales, rhonchi Heart- RRR (paced), no murmurs, rubs or gallops, PMI not laterally displaced GI- soft, non-tender, non-distended Extremities- no clubbing, cyanosis, or edema MS- no significant deformity or atrophy Skin- warm and dry, no rash or lesion, left chest without hematoma/ecchymosis Psych- euthymic mood, full affect Neuro- no gross defecits  Labs:   Lab Results  Component Value Date   WBC 5.2 12/28/2016   HGB 15.1 12/28/2016   HCT 44.8 12/28/2016   MCV 86 12/28/2016   PLT 204 12/28/2016   No results for input(s): NA, K, CL, CO2, BUN, CREATININE, CALCIUM, PROT, BILITOT, ALKPHOS, ALT, AST, GLUCOSE in the last 168 hours.  Invalid input(s): LABALBU  Discharge Medications:  Allergies as of 01/05/2017   No Known Allergies     Medication List    STOP taking these medications   meloxicam 15 MG  tablet Commonly known as:  MOBIC     TAKE these medications   acetaminophen 325 MG tablet Commonly known as:  TYLENOL Take 2 tablets (650 mg total) by mouth every 6 (six) hours as needed for mild pain or moderate pain.   colchicine 0.6 MG tablet Take 0.6 mg by mouth daily as needed (for gout).   COMBIGAN 0.2-0.5 % ophthalmic solution Generic drug:  brimonidine-timolol Place 1  drop into both eyes 2 (two) times daily.   ENTRESTO 97-103 MG Generic drug:  sacubitril-valsartan Take 1 tablet by mouth 2 (two) times daily.   gabapentin 300 MG capsule Commonly known as:  NEURONTIN TAKE 1 CAPSULE THREE TIMES A DAY What changed:  See the new instructions.   glipiZIDE 5 MG 24 hr tablet Commonly known as:  GLUCOTROL XL Take 5 mg by mouth daily with breakfast.   glucose blood test strip Use to check blood sugars 1-2 times daily   JANUVIA 100 MG tablet Generic drug:  sitaGLIPtin TAKE 1 TABLET DAILY What changed:  See the new instructions.   latanoprost 0.005 % ophthalmic solution Commonly known as:  XALATAN Place 1 drop into both eyes at bedtime.   meclizine 25 MG tablet Commonly known as:  ANTIVERT Take 25 mg by mouth 3 (three) times daily as needed for dizziness.   metFORMIN 1000 MG tablet Commonly known as:  GLUCOPHAGE TAKE 1 TABLET TWICE A DAY   metoprolol succinate 100 MG 24 hr tablet Commonly known as:  TOPROL-XL 1 and 1/2 tabs po qd What changed:  how much to take  how to take this  when to take this  additional instructions   omeprazole 40 MG capsule Commonly known as:  PRILOSEC Take 1 capsule (40 mg total) by mouth daily.   PRECISION ULTRA LANCET Misc Use as instructed   ONE TOUCH LANCETS Misc Use to check blood sugar 1-2 times daily   rOPINIRole 1 MG tablet Commonly known as:  REQUIP TAKE 1 TABLET DAILY (NEED OFFICE VISIT FOR MORE REFILLS) What changed:  See the new instructions.   sertraline 100 MG tablet Commonly known as:  ZOLOFT TAKE 1 TABLET DAILY   simvastatin 20 MG tablet Commonly known as:  ZOCOR TAKE 1 TABLET EVERY EVENING   XARELTO 20 MG Tabs tablet Generic drug:  rivaroxaban TAKE 1 TABLET DAILY WITH SUPPER       Disposition:  Home Discharge Instructions    Diet - low sodium heart healthy    Complete by:  As directed    Increase activity slowly    Complete by:  As directed      Follow-up  Information    [facility] Follow up on 01/15/2017.   Specialty:  Cardiology Why:  3:00AM, wound check visit Contact information: [address] [place]       Constance Haw, MD Follow up on 04/18/2017.   Specialty:  Cardiology Why:  2:30AM Contact information: [place] [address] [phone]           Duration of Discharge Encounter: Greater than 30 minutes including physician time.  Venetia Night, PA-C 01/05/2017 9:24 AM   I have seen and examined this patient with Tommye Standard.  Agree with above, note added to reflect my findings.  On exam, RRR, no murmurs, lungs clear. Had CRT-D upgrade yesterday from dual chamber pacemaker. Interrogation and CXR without abnormality. Plan for discharge today with follow  up in device clinic.    Quinlin Conant M. Montay Vanvoorhis MD 01/05/2017 10:33 AM

## 2017-01-04 NOTE — Progress Notes (Signed)
ICD Criteria  Current LVEF:25-35%. Within 12 months prior to implant: Yes   Heart failure history: Yes, Class II  Cardiomyopathy history: Yes, Non-Ischemic Cardiomyopathy.  Atrial Fibrillation/Atrial Flutter: Yes, Permanent.  Ventricular tachycardia history: No.  Cardiac arrest history: No.  History of syndromes with risk of sudden death: No.  Previous ICD: No.  Current ICD indication: Primary  PPM indication: Yes. Pacing type: Ventricular. Greater than 40% RV pacing requirement anticipated. Indication: Complete Heart Block   Class I or II Bradycardia indication present: No  Beta Blocker therapy for 3 or more months: Yes, prescribed.   Ace Inhibitor/ARB therapy for 3 or more months: Yes, prescribed.

## 2017-01-05 ENCOUNTER — Encounter (HOSPITAL_COMMUNITY): Payer: Self-pay | Admitting: Cardiology

## 2017-01-05 ENCOUNTER — Ambulatory Visit (HOSPITAL_COMMUNITY): Payer: Medicare Other

## 2017-01-05 DIAGNOSIS — I428 Other cardiomyopathies: Secondary | ICD-10-CM | POA: Diagnosis not present

## 2017-01-05 DIAGNOSIS — I130 Hypertensive heart and chronic kidney disease with heart failure and stage 1 through stage 4 chronic kidney disease, or unspecified chronic kidney disease: Secondary | ICD-10-CM | POA: Diagnosis not present

## 2017-01-05 DIAGNOSIS — K219 Gastro-esophageal reflux disease without esophagitis: Secondary | ICD-10-CM | POA: Diagnosis not present

## 2017-01-05 DIAGNOSIS — I447 Left bundle-branch block, unspecified: Secondary | ICD-10-CM | POA: Diagnosis not present

## 2017-01-05 DIAGNOSIS — I429 Cardiomyopathy, unspecified: Secondary | ICD-10-CM | POA: Diagnosis not present

## 2017-01-05 DIAGNOSIS — E1122 Type 2 diabetes mellitus with diabetic chronic kidney disease: Secondary | ICD-10-CM | POA: Diagnosis not present

## 2017-01-05 DIAGNOSIS — G8929 Other chronic pain: Secondary | ICD-10-CM | POA: Diagnosis not present

## 2017-01-05 DIAGNOSIS — N183 Chronic kidney disease, stage 3 (moderate): Secondary | ICD-10-CM | POA: Diagnosis not present

## 2017-01-05 DIAGNOSIS — Z452 Encounter for adjustment and management of vascular access device: Secondary | ICD-10-CM | POA: Diagnosis not present

## 2017-01-05 DIAGNOSIS — I5042 Chronic combined systolic (congestive) and diastolic (congestive) heart failure: Secondary | ICD-10-CM | POA: Diagnosis not present

## 2017-01-05 DIAGNOSIS — I442 Atrioventricular block, complete: Secondary | ICD-10-CM | POA: Diagnosis not present

## 2017-01-05 DIAGNOSIS — M4726 Other spondylosis with radiculopathy, lumbar region: Secondary | ICD-10-CM | POA: Diagnosis not present

## 2017-01-05 DIAGNOSIS — I2510 Atherosclerotic heart disease of native coronary artery without angina pectoris: Secondary | ICD-10-CM | POA: Diagnosis not present

## 2017-01-05 DIAGNOSIS — I482 Chronic atrial fibrillation: Secondary | ICD-10-CM | POA: Diagnosis not present

## 2017-01-05 LAB — GLUCOSE, CAPILLARY: Glucose-Capillary: 133 mg/dL — ABNORMAL HIGH (ref 65–99)

## 2017-01-05 MED ORDER — OFF THE BEAT BOOK
Freq: Once | Status: AC
  Administered 2017-01-05: 1
  Filled 2017-01-05: qty 1

## 2017-01-05 MED ORDER — YOU HAVE A PACEMAKER BOOK
Freq: Once | Status: AC
  Administered 2017-01-05: 07:00:00 1
  Filled 2017-01-05: qty 1

## 2017-01-11 DIAGNOSIS — S61215D Laceration without foreign body of left ring finger without damage to nail, subsequent encounter: Secondary | ICD-10-CM | POA: Diagnosis not present

## 2017-01-11 DIAGNOSIS — S64495D Injury of digital nerve of left ring finger, subsequent encounter: Secondary | ICD-10-CM | POA: Diagnosis not present

## 2017-01-11 DIAGNOSIS — S66195D Other injury of flexor muscle, fascia and tendon of left ring finger at wrist and hand level, subsequent encounter: Secondary | ICD-10-CM | POA: Diagnosis not present

## 2017-01-15 ENCOUNTER — Ambulatory Visit (INDEPENDENT_AMBULATORY_CARE_PROVIDER_SITE_OTHER): Payer: Medicare Other | Admitting: *Deleted

## 2017-01-15 DIAGNOSIS — I4430 Unspecified atrioventricular block: Secondary | ICD-10-CM | POA: Diagnosis not present

## 2017-01-15 DIAGNOSIS — I5022 Chronic systolic (congestive) heart failure: Secondary | ICD-10-CM | POA: Diagnosis not present

## 2017-01-16 LAB — CUP PACEART INCLINIC DEVICE CHECK
Battery Remaining Longevity: 65 mo
Battery Voltage: 3.09 V
Brady Statistic AP VP Percent: 0 %
Brady Statistic AP VS Percent: 0 %
Brady Statistic AS VP Percent: 0 %
Brady Statistic AS VS Percent: 0 %
Brady Statistic RA Percent Paced: 0 %
Brady Statistic RV Percent Paced: 99.96 %
Date Time Interrogation Session: 20180625192025
HighPow Impedance: 62 Ohm
Implantable Lead Implant Date: 20130715
Implantable Lead Implant Date: 20180614
Implantable Lead Implant Date: 20180614
Implantable Lead Location: 753858
Implantable Lead Location: 753859
Implantable Lead Location: 753860
Implantable Lead Model: 4598
Implantable Pulse Generator Implant Date: 20180614
Lead Channel Impedance Value: 195.429
Lead Channel Impedance Value: 195.429
Lead Channel Impedance Value: 198.837
Lead Channel Impedance Value: 232.653
Lead Channel Impedance Value: 232.653
Lead Channel Impedance Value: 342 Ohm
Lead Channel Impedance Value: 399 Ohm
Lead Channel Impedance Value: 456 Ohm
Lead Channel Impedance Value: 456 Ohm
Lead Channel Impedance Value: 456 Ohm
Lead Channel Impedance Value: 475 Ohm
Lead Channel Impedance Value: 513 Ohm
Lead Channel Impedance Value: 608 Ohm
Lead Channel Impedance Value: 665 Ohm
Lead Channel Impedance Value: 703 Ohm
Lead Channel Impedance Value: 722 Ohm
Lead Channel Impedance Value: 817 Ohm
Lead Channel Impedance Value: 817 Ohm
Lead Channel Pacing Threshold Amplitude: 0.625 V
Lead Channel Pacing Threshold Amplitude: 0.75 V
Lead Channel Pacing Threshold Pulse Width: 0.4 ms
Lead Channel Pacing Threshold Pulse Width: 1 ms
Lead Channel Sensing Intrinsic Amplitude: 0.625 mV
Lead Channel Setting Pacing Amplitude: 2.75 V
Lead Channel Setting Pacing Amplitude: 3.5 V
Lead Channel Setting Pacing Pulse Width: 0.4 ms
Lead Channel Setting Pacing Pulse Width: 1 ms
Lead Channel Setting Sensing Sensitivity: 0.3 mV

## 2017-01-16 NOTE — Progress Notes (Signed)
Wound check appointment. Steri-strips removed. Wound without redness or edema. Incision edges approximated, wound well healed. Normal device function. Thresholds, sensing, and impedances consistent with implant measurements. Device programmed at 3.5V(RV) and 2.75V (LV) for extra safety margin until 3 month visit. Histogram distribution appropriate for patient and level of activity. Permanent AF + xarelto. (1) ventricular arrhythmia episode noted x 6bts @ 240bpm. Patient educated about wound care, arm mobility, lifting restrictions, shock plan. ROV in 3 months with WC.

## 2017-01-18 DIAGNOSIS — S66195D Other injury of flexor muscle, fascia and tendon of left ring finger at wrist and hand level, subsequent encounter: Secondary | ICD-10-CM | POA: Diagnosis not present

## 2017-01-18 DIAGNOSIS — S61215D Laceration without foreign body of left ring finger without damage to nail, subsequent encounter: Secondary | ICD-10-CM | POA: Diagnosis not present

## 2017-01-18 DIAGNOSIS — S64495D Injury of digital nerve of left ring finger, subsequent encounter: Secondary | ICD-10-CM | POA: Diagnosis not present

## 2017-01-23 DIAGNOSIS — S66125A Laceration of flexor muscle, fascia and tendon of left ring finger at wrist and hand level, initial encounter: Secondary | ICD-10-CM | POA: Diagnosis not present

## 2017-01-23 DIAGNOSIS — S61215D Laceration without foreign body of left ring finger without damage to nail, subsequent encounter: Secondary | ICD-10-CM | POA: Diagnosis not present

## 2017-01-23 DIAGNOSIS — S64495D Injury of digital nerve of left ring finger, subsequent encounter: Secondary | ICD-10-CM | POA: Diagnosis not present

## 2017-01-24 ENCOUNTER — Other Ambulatory Visit: Payer: Self-pay | Admitting: Family Medicine

## 2017-02-01 DIAGNOSIS — S61215D Laceration without foreign body of left ring finger without damage to nail, subsequent encounter: Secondary | ICD-10-CM | POA: Diagnosis not present

## 2017-02-01 DIAGNOSIS — S66195D Other injury of flexor muscle, fascia and tendon of left ring finger at wrist and hand level, subsequent encounter: Secondary | ICD-10-CM | POA: Diagnosis not present

## 2017-02-01 DIAGNOSIS — S64495D Injury of digital nerve of left ring finger, subsequent encounter: Secondary | ICD-10-CM | POA: Diagnosis not present

## 2017-02-11 ENCOUNTER — Other Ambulatory Visit: Payer: Self-pay | Admitting: Family Medicine

## 2017-02-15 ENCOUNTER — Ambulatory Visit: Payer: Medicare Other | Admitting: Family Medicine

## 2017-02-19 ENCOUNTER — Ambulatory Visit (INDEPENDENT_AMBULATORY_CARE_PROVIDER_SITE_OTHER): Payer: Medicare Other | Admitting: Family Medicine

## 2017-02-19 ENCOUNTER — Encounter: Payer: Self-pay | Admitting: Family Medicine

## 2017-02-19 VITALS — BP 103/73 | HR 75 | Temp 97.7°F | Resp 16 | Ht 71.0 in | Wt 233.2 lb

## 2017-02-19 DIAGNOSIS — M1711 Unilateral primary osteoarthritis, right knee: Secondary | ICD-10-CM

## 2017-02-19 DIAGNOSIS — M25461 Effusion, right knee: Secondary | ICD-10-CM

## 2017-02-19 LAB — CBC WITH DIFFERENTIAL/PLATELET
Basophils Absolute: 0 10*3/uL (ref 0.0–0.1)
Basophils Relative: 0.2 % (ref 0.0–3.0)
Eosinophils Absolute: 0 10*3/uL (ref 0.0–0.7)
Eosinophils Relative: 0.6 % (ref 0.0–5.0)
HCT: 40.9 % (ref 39.0–52.0)
Hemoglobin: 13.5 g/dL (ref 13.0–17.0)
Lymphocytes Relative: 10.3 % — ABNORMAL LOW (ref 12.0–46.0)
Lymphs Abs: 0.7 10*3/uL (ref 0.7–4.0)
MCHC: 33 g/dL (ref 30.0–36.0)
MCV: 90.2 fl (ref 78.0–100.0)
Monocytes Absolute: 0.3 10*3/uL (ref 0.1–1.0)
Monocytes Relative: 4 % (ref 3.0–12.0)
Neutro Abs: 6 10*3/uL (ref 1.4–7.7)
Neutrophils Relative %: 84.9 % — ABNORMAL HIGH (ref 43.0–77.0)
Platelets: 145 10*3/uL — ABNORMAL LOW (ref 150.0–400.0)
RBC: 4.53 Mil/uL (ref 4.22–5.81)
RDW: 14.6 % (ref 11.5–15.5)
WBC: 7 10*3/uL (ref 4.0–10.5)

## 2017-02-19 LAB — SEDIMENTATION RATE: Sed Rate: 28 mm/hr — ABNORMAL HIGH (ref 0–20)

## 2017-02-19 LAB — BASIC METABOLIC PANEL
BUN: 15 mg/dL (ref 6–23)
CO2: 30 mEq/L (ref 19–32)
Calcium: 9 mg/dL (ref 8.4–10.5)
Chloride: 101 mEq/L (ref 96–112)
Creatinine, Ser: 1.2 mg/dL (ref 0.40–1.50)
GFR: 62.81 mL/min (ref >60.00)
Glucose, Bld: 212 mg/dL — ABNORMAL HIGH (ref 70–99)
Potassium: 4.8 mEq/L (ref 3.5–5.1)
Sodium: 139 mEq/L (ref 135–145)

## 2017-02-19 LAB — C-REACTIVE PROTEIN: CRP: 8.9 mg/dL (ref 0.5–20.0)

## 2017-02-19 LAB — URIC ACID: Uric Acid, Serum: 4.6 mg/dL (ref 4.0–7.8)

## 2017-02-19 MED ORDER — PREDNISONE 20 MG PO TABS: ORAL_TABLET | ORAL | 0 refills | Status: DC

## 2017-02-19 NOTE — Progress Notes (Signed)
OFFICE VISIT  02/19/2017   CC:  Chief Complaint  Patient presents with  . Edema    right knee and foot   HPI:    Patient is a 76 y.o. Caucasian male who presents for right knee and foot swelling. Onset 3 d/a R knee swelling.  No injury or sprain.  Hurts a lot, worse with wt bearing. Flexion is limited some.  Very limited in ability to walk since this happened.  Was slightly red initially but this is resolved now.  The pain is slightly less today.  Tylenol no help. Also notes some slight R LL swelling since onset of R knee pain/swelling.  ROS: no other joint pain/swelling at this time.  No fever, chills, or malasie.  No dizziness, no nausea, no hemoptysis, no nose bleeds, no hematochezia or melena, no SOB or CP.    Past Medical History:  Diagnosis Date  . AICD (automatic cardioverter/defibrillator) present   . Arthritis    left shoulder (Dr. [name]): steroid injection 04/03/16.  Plan for TSA surgery 06/2016.  [name] BPH (benign prostatic hypertrophy) 06/2011   Irritative sx's; pt declined trial of anticholinergic per Urology records  . CHF (congestive heart failure) ([facility])   . Chronic combined systolic and diastolic heart failure ([place]) 05/31/2012   Nonischemic:  EF 40-45%, LA mod-severe dilated, AFIB.   02/2016 EF 40%, diffuse hypokinesis, grade 2 DD.  Myoc perf imaging showed EF 32% 04/2016.  Pt upgraded to CRT-D 01/04/17.  [name] Chronic low back pain   . Chronic renal insufficiency, stage III (moderate) 2015   CrCl about 60 ml/min  . Depression   . DOE (dyspnea on exertion)    NYHA class I CHF  . Dyspnea    with exertion  . Episodic low back pain 01/22/2013   w/intermittent radiculitis (12/2014 his neurologist referred him to pain mgmt for epidural steroid injection)  . GERD (gastroesophageal reflux disease)   . H/O tilt table evaluation 11/02/05   negative  . Helicobacter pylori gastritis 01/2016  . High cholesterol   . History of adenomatous polyp of colon 10/12/11   Dr. [name] (3  right side of colon- tubular adenomas removed)  . History of cardiovascular stress test 05/28/12   no ischemia, EF 37%, imaging results are unchanged and within normal variance  . History of chronic prostatitis   . History of kidney stones   . History of vertigo    + Hx of posterior HA's.  Neuro (Dr. Erling Cruz) eval 2011.  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  . Hypertension   . Lumbar spondylosis    lumbosacral radiculopathy at L4 by EMG testing, right foot drop (neurologist is Dr. Linus Salmons with [facility] in [place])--neurologist referred him to neurosurgery  . Migraine    "used to have them all the time; none for years" (01/04/2017)  . Myocardial infarction [facility]) ?1970s  . Nephrolithiasis 07/2012   Left UVJ 2 mm stone with dilation of renal collecting system and slight hydroureter on right  . Neuropathy   . 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  Since no CAD on cath 05/2016, cards recommends upgrade to CRT-D.  [name] Permanent atrial fibrillation ([place])    DCCV 07/09/13-converted, lasted two days, then back into afib--needs lifetime anticoagulation (Xarelto as of 09/2014)  . Skin cancer of arm, left    "burned it off" (01/04/2017)  . Type II diabetes mellitus ([place])  Past Surgical History:  Procedure Laterality Date  . BACK SURGERY    . BI-VENTRICULAR IMPLANTABLE CARDIOVERTER DEFIBRILLATOR  (CRT-D) Left 01/04/2017  . BIV ICD INSERTION CRT-D N/A 01/04/2017   Procedure: BiV ICD ;  Surgeon: Constance Haw, MD;  Location: [facility];  Service: Cardiovascular;  Laterality: N/A;  . CARDIAC CATHETERIZATION N/A 06/14/2016   Minimal nonobstructive dz, EF 25-35%.  Procedure: Left Heart Cath and Coronary Angiography;  Surgeon: Peter M Martinique, MD;  Location: [facility];  Service: Cardiovascular;  Laterality: N/A;  . CARDIOVASCULAR  STRESS TEST  2012   2012 nuclear perfusion study: low risk scan; 04/2016 normal myocardial perfusion imaging, EF 32%.  [name] CARDIOVERSION  07/09/2012   Procedure: CARDIOVERSION;  Surgeon: [name], MD;  Location: [facility];  Service: Cardiovascular;  Laterality: N/A;  . CATARACT EXTRACTION W/ INTRAOCULAR LENS IMPLANT & ANTERIOR VITRECTOMY, BILATERAL Bilateral   . COLONOSCOPY W/ POLYPECTOMY  approx 2006; repeated 09/2011   Polyps on 2013 EGD as well, repeat 12/2014  . ESOPHAGOGASTRODUODENOSCOPY  10/18/06   Done due to chronic GERD: Normal, bx showed no barrett's esophagus (Dr. [name])  . FLEXOR TENDON REPAIR Left 10/02/2016   Procedure: LEFT RING FINGER WOUND EXPORATION AND FLEXOR TENDON REPAIR AND NERVE REPAIR;  Surgeon: Milly Jakob, MD;  Location: [facility];  Service: Orthopedics;  Laterality: Left;  . INSERT / REPLACE / REMOVE PACEMAKER  02/05/2012   dual chamber, sinus node dysfunction, sinus arrest, PAF, Medtronic Revo serial#[id number]: last checked 05/2015  . LUMBAR LAMINECTOMY Left 1976   L4-5  . PACEMAKER REMOVAL  01/04/2017  . PERMANENT PACEMAKER INSERTION N/A 02/05/2012   Procedure: PERMANENT PACEMAKER INSERTION;  Surgeon: [name], MD; Generator Medtronic Revo model [place] serial number [id number] H Laterality: N/A;  . RETINAL DETACHMENT SURGERY Left ~ 1999  . REVERSE SHOULDER ARTHROPLASTY Left 2018   Left shoulder reverse TSA [name] in [place]  . TRANSTHORACIC ECHOCARDIOGRAM  08/25/10; 05/2012; 03/23/16   mild asymmetric LVH, normal systolic function, normal diastolic fxn, mild-to-mod mitral regurg, mild aortic valve sclerosis and trace AI, mild aortic root dilatation. 2014 f/u showed EF 40-45%, mod LAE, A FIB.  02/2016 EF 40%, diffuse hypokinesis, grade 2 DD.    Outpatient Medications Prior to Visit  Medication Sig Dispense Refill  . acetaminophen (TYLENOL) 325 MG tablet Take 2 tablets (650 mg total) by mouth every 6 (six) hours as needed for mild pain or moderate  pain.    [name] colchicine 0.6 MG tablet Take 0.6 mg by mouth daily as needed (for gout).     . COMBIGAN 0.2-0.5 % ophthalmic solution Place 1 drop into both eyes 2 (two) times daily.     [name] ENTRESTO 97-103 MG Take 1 tablet by mouth 2 (two) times daily. 180 tablet 1  . gabapentin (NEURONTIN) 300 MG capsule TAKE 1 CAPSULE THREE TIMES A DAY (Patient taking differently: Take 2 capsules in the AM and 1 capsule at bedtime.) 270 capsule 3  . GLIPIZIDE XL 5 MG 24 hr tablet TAKE 1 TABLET EVERY MORNING 90 tablet 0  . glucose blood test strip Use to check blood sugars 1-2 times daily 100 each 1  . JANUVIA 100 MG tablet TAKE 1 TABLET DAILY 90 tablet 0  . Lancets (PRECISION ULTRA LANCET) MISC Use as instructed 100 each 3  . latanoprost (XALATAN) 0.005 % ophthalmic solution Place 1 drop into both eyes at bedtime.   12  . meclizine (ANTIVERT) 25 MG tablet Take 25 mg by mouth  3 (three) times daily as needed for dizziness.    . metFORMIN (GLUCOPHAGE) 1000 MG tablet TAKE 1 TABLET TWICE A DAY 180 tablet 0  . metoprolol succinate (TOPROL-XL) 100 MG 24 hr tablet 1 and 1/2 tabs po qd (Patient taking differently: Take 50-100 mg by mouth See admin instructions. Take 100 mg by mouth in the morning and take 50 mg by mouth at night) 135 tablet 3  . omeprazole (PRILOSEC) 40 MG capsule TAKE 1 CAPSULE DAILY 90 capsule 0  . ONE TOUCH LANCETS MISC Use to check blood sugar 1-2 times daily 200 each 0  . rOPINIRole (REQUIP) 1 MG tablet TAKE 1 TABLET DAILY (NEED OFFICE VISIT FOR MORE REFILLS) (Patient taking differently: Take 41m at bedtime) 90 tablet 1  . sertraline (ZOLOFT) 100 MG tablet TAKE 1 TABLET DAILY 90 tablet 1  . simvastatin (ZOCOR) 20 MG tablet TAKE 1 TABLET EVERY EVENING 90 tablet 0  . XARELTO 20 MG TABS tablet TAKE 1 TABLET DAILY WITH SUPPER 90 tablet 1   No facility-administered medications prior to visit.     No Known Allergies  ROS As per HPI  PE: Blood pressure 103/73, pulse 75, temperature 97.7 F (36.5 C),  temperature source Oral, resp. rate 16, height 5' 11" (1.803 m), weight 233 lb 4 oz (105.8 kg), SpO2 95 %. Gen: Alert, well appearing.  Patient is oriented to person, place, time, and situation. AFFECT: pleasant, lucid thought and speech. Right knee: + generalized swelling, + effusion, + TTP in peripatellar area L>R.  He can extend it to 175 deg and flex it passively to 40 deg. No prepatellar fluctuance.  Mild pain with patellar grind.  He has 1+ pitting edema on R LL from knee down into foot.  No popliteal region tenderness. No calf pain or cord.    LABS:  Lab Results  Component Value Date   HGBA1C 7.1 11/16/2016     Chemistry      Component Value Date/Time   NA 139 12/28/2016 1405   K 5.0 12/28/2016 1405   CL 98 12/28/2016 1405   CO2 23 12/28/2016 1405   BUN 25 12/28/2016 1405   CREATININE 1.34 (H) 12/28/2016 1405   CREATININE 1.21 (H) 06/06/2016 1312      Component Value Date/Time   CALCIUM 9.9 12/28/2016 1405   ALKPHOS 78 02/21/2016 1629   AST 17 02/21/2016 1629   ALT 17 02/21/2016 1629   BILITOT 0.7 02/21/2016 1629     Lab Results  Component Value Date   WBC 5.2 12/28/2016   HGB 15.1 12/28/2016   HCT 44.8 12/28/2016   MCV 86 12/28/2016   PLT 204 12/28/2016   Lab Results  Component Value Date   ESRSEDRATE 10 02/05/2014   IMPRESSION AND PLAN:  Acute R knee pain, 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 He had same problem 08/2016 and it responded quickly to prednisone.  A knee x-ray at that time showed minimal DJD. I am very hesitant to tap knee to obtain fluid or inject steroid due to pt being on xarelto. Will do prednisone again: [id number] x 5d. Check uric acid, ESR, CRP, CBC w/diff, and BMET. If he has another flare in the next 4-6 mo, will ask rheum to see him to get further eval/mgmt since I can't get crystal confirmation of the dx of gout in this case.  An After Visit Summary was printed and given to the patient.  FOLLOW UP: Return if symptoms  worsen or fail to  improve.  Signed:  Crissie Sickles, MD           02/19/2017

## 2017-02-20 ENCOUNTER — Encounter: Payer: Self-pay | Admitting: *Deleted

## 2017-02-22 ENCOUNTER — Ambulatory Visit: Payer: Medicare Other | Admitting: Cardiovascular Disease

## 2017-02-26 DIAGNOSIS — S64495D Injury of digital nerve of left ring finger, subsequent encounter: Secondary | ICD-10-CM | POA: Diagnosis not present

## 2017-02-26 DIAGNOSIS — S66195D Other injury of flexor muscle, fascia and tendon of left ring finger at wrist and hand level, subsequent encounter: Secondary | ICD-10-CM | POA: Diagnosis not present

## 2017-02-26 DIAGNOSIS — S61215D Laceration without foreign body of left ring finger without damage to nail, subsequent encounter: Secondary | ICD-10-CM | POA: Diagnosis not present

## 2017-02-28 ENCOUNTER — Ambulatory Visit: Payer: Medicare Other | Admitting: Family Medicine

## 2017-02-28 NOTE — Progress Notes (Deleted)
OFFICE VISIT  02/28/2017   CC: No chief complaint on file.  HPI:    Patient is a 76 y.o. Caucasian male who presents for 3 mo f/u DM 2, HTN, HLD, and CRI stage II/III. He also has persistent A-fib and nonischemic CM followed by cardiologist.  Past Medical History:  Diagnosis Date  . AICD (automatic cardioverter/defibrillator) present   . Arthritis    left shoulder (Dr. [name]): steroid injection 04/03/16.  Plan for TSA surgery 06/2016.  [name] BPH (benign prostatic hypertrophy) 06/2011   Irritative sx's; pt declined trial of anticholinergic per Urology records  . CHF (congestive heart failure) ([place])   . Chronic combined systolic and diastolic heart failure ([place]) 05/31/2012   Nonischemic:  EF 40-45%, LA mod-severe dilated, AFIB.   02/2016 EF 40%, diffuse hypokinesis, grade 2 DD.  Myoc perf imaging showed EF 32% 04/2016.  Pt upgraded to CRT-D 01/04/17.  [name] Chronic low back pain   . Chronic renal insufficiency, stage III (moderate) 2015   CrCl about 60 ml/min  . Depression   . DOE (dyspnea on exertion)    NYHA class I CHF  . Dyspnea    with exertion  . Episodic low back pain 01/22/2013   w/intermittent radiculitis (12/2014 his neurologist referred him to pain mgmt for epidural steroid injection)  . GERD (gastroesophageal reflux disease)   . H/O tilt table evaluation 11/02/05   negative  . Helicobacter pylori gastritis 01/2016  . High cholesterol   . History of adenomatous polyp of colon 10/12/11   Dr. [name] (3 right side of colon- tubular adenomas removed)  . History of cardiovascular stress test 05/28/12   no ischemia, EF 37%, imaging results are unchanged and within normal variance  . History of chronic prostatitis   . History of kidney stones   . History of vertigo    + Hx of posterior HA's.  Neuro (Dr. Erling Cruz) eval 2011.  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  . Hypertension   . Lumbar spondylosis    lumbosacral radiculopathy at L4 by EMG testing, right foot drop (neurologist is Dr. Linus Salmons with [facility] in [place])--neurologist referred him to neurosurgery  . Migraine    "used to have them all the time; none for years" (01/04/2017)  . Myocardial infarction [facility]) ?1970s  . Nephrolithiasis 07/2012   Left UVJ 2 mm stone with dilation of renal collecting system and slight hydroureter on right  . Neuropathy   . 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  Since no CAD on cath 05/2016, cards recommends upgrade to CRT-D.  [name] Permanent atrial fibrillation ([place])    DCCV 07/09/13-converted, lasted two days, then back into afib--needs lifetime anticoagulation (Xarelto as of 09/2014)  . Skin cancer of arm, left    "burned it off" (01/04/2017)  . Type II diabetes mellitus ([place])     Past Surgical History:  Procedure Laterality Date  . BACK SURGERY    . BI-VENTRICULAR IMPLANTABLE CARDIOVERTER DEFIBRILLATOR  (CRT-D) Left 01/04/2017  . BIV ICD INSERTION CRT-D N/A 01/04/2017   Procedure: BiV ICD ;  Surgeon: Constance Haw, MD;  Location: [place] CV [facility];  Service: Cardiovascular;  Laterality: N/A;  . CARDIAC CATHETERIZATION N/A 06/14/2016   Minimal nonobstructive dz, EF 25-35%.  Procedure: Left Heart Cath and Coronary Angiography;  Surgeon: Peter M Martinique, MD;  Location: [place] CV LAB;  Service: Cardiovascular;  Laterality: N/A;  .  CARDIOVASCULAR STRESS TEST  2012   2012 nuclear perfusion study: low risk scan; 04/2016 normal myocardial perfusion imaging, EF 32%.  [name] CARDIOVERSION  07/09/2012   Procedure: CARDIOVERSION;  Surgeon: [name], MD;  Location: [place];  Service: Cardiovascular;  Laterality: N/A;  . CATARACT EXTRACTION W/ INTRAOCULAR LENS IMPLANT & ANTERIOR VITRECTOMY, BILATERAL Bilateral   . COLONOSCOPY W/ POLYPECTOMY  approx 2006; repeated 09/2011   Polyps on 2013 EGD as well, repeat 12/2014  .  ESOPHAGOGASTRODUODENOSCOPY  10/18/06   Done due to chronic GERD: Normal, bx showed no barrett's esophagus (Dr. [name])  . FLEXOR TENDON REPAIR Left 10/02/2016   Procedure: LEFT RING FINGER WOUND EXPORATION AND FLEXOR TENDON REPAIR AND NERVE REPAIR;  Surgeon: Milly Jakob, MD;  Location: [place];  Service: Orthopedics;  Laterality: Left;  . INSERT / REPLACE / REMOVE PACEMAKER  02/05/2012   dual chamber, sinus node dysfunction, sinus arrest, PAF, Medtronic Revo serial#-[id number] H: last checked 05/2015  . LUMBAR LAMINECTOMY Left 1976   L4-5  . PACEMAKER REMOVAL  01/04/2017  . PERMANENT PACEMAKER INSERTION N/A 02/05/2012   Procedure: PERMANENT PACEMAKER INSERTION;  Surgeon: [name], MD; Generator Medtronic Revo model [place] serial number [id number] H Laterality: N/A;  . RETINAL DETACHMENT SURGERY Left ~ 1999  . REVERSE SHOULDER ARTHROPLASTY Left 2018   Left shoulder reverse TSA [name] [place]  . TRANSTHORACIC ECHOCARDIOGRAM  08/25/10; 05/2012; 03/23/16   mild asymmetric LVH, normal systolic function, normal diastolic fxn, mild-to-mod mitral regurg, mild aortic valve sclerosis and trace AI, mild aortic root dilatation. 2014 f/u showed EF 40-45%, mod LAE, A FIB.  02/2016 EF 40%, diffuse hypokinesis, grade 2 DD.    Outpatient Medications Prior to Visit  Medication Sig Dispense Refill  . acetaminophen (TYLENOL) 325 MG tablet Take 2 tablets (650 mg total) by mouth every 6 (six) hours as needed for mild pain or moderate pain.    [name] colchicine 0.6 MG tablet Take 0.6 mg by mouth daily as needed (for gout).     . COMBIGAN 0.2-0.5 % ophthalmic solution Place 1 drop into both eyes 2 (two) times daily.     [name] ENTRESTO 97-103 MG Take 1 tablet by mouth 2 (two) times daily. 180 tablet 1  . gabapentin (NEURONTIN) 300 MG capsule TAKE 1 CAPSULE THREE TIMES A DAY (Patient taking differently: Take 2 capsules in the AM and 1 capsule at bedtime.) 270 capsule 3  . GLIPIZIDE XL 5 MG 24 hr tablet TAKE 1 TABLET  EVERY MORNING 90 tablet 0  . glucose blood test strip Use to check blood sugars 1-2 times daily 100 each 1  . JANUVIA 100 MG tablet TAKE 1 TABLET DAILY 90 tablet 0  . Lancets (PRECISION ULTRA LANCET) MISC Use as instructed 100 each 3  . latanoprost (XALATAN) 0.005 % ophthalmic solution Place 1 drop into both eyes at bedtime.   12  . meclizine (ANTIVERT) 25 MG tablet Take 25 mg by mouth 3 (three) times daily as needed for dizziness.    . metFORMIN (GLUCOPHAGE) 1000 MG tablet TAKE 1 TABLET TWICE A DAY 180 tablet 0  . metoprolol succinate (TOPROL-XL) 100 MG 24 hr tablet 1 and 1/2 tabs po qd (Patient taking differently: Take 50-100 mg by mouth See admin instructions. Take 100 mg by mouth in the morning and take 50 mg by mouth at night) 135 tablet 3  . omeprazole (PRILOSEC) 40 MG capsule TAKE 1 CAPSULE DAILY 90 capsule 0  . ONE TOUCH LANCETS MISC Use to check blood  sugar 1-2 times daily 200 each 0  . predniSONE (DELTASONE) 20 MG tablet 2 tabs po qd 10 tablet 0  . rOPINIRole (REQUIP) 1 MG tablet TAKE 1 TABLET DAILY (NEED OFFICE VISIT FOR MORE REFILLS) (Patient taking differently: Take 1mg  at bedtime) 90 tablet 1  . sertraline (ZOLOFT) 100 MG tablet TAKE 1 TABLET DAILY 90 tablet 1  . simvastatin (ZOCOR) 20 MG tablet TAKE 1 TABLET EVERY EVENING 90 tablet 0  . XARELTO 20 MG TABS tablet TAKE 1 TABLET DAILY WITH SUPPER 90 tablet 1   No facility-administered medications prior to visit.     No Known Allergies  ROS As per HPI  PE: There were no vitals taken for this visit. ***  LABS:  Lab Results  Component Value Date   TSH 3.37 06/06/2016   Lab Results  Component Value Date   WBC 7.0 02/19/2017   HGB 13.5 02/19/2017   HCT 40.9 02/19/2017   MCV 90.2 02/19/2017   PLT 145.0 (L) 02/19/2017   Lab Results  Component Value Date   CREATININE 1.20 02/19/2017   BUN 15 02/19/2017   NA 139 02/19/2017   K 4.8 02/19/2017   CL 101 02/19/2017   CO2 30 02/19/2017   Lab Results  Component Value  Date   ALT 17 02/21/2016   AST 17 02/21/2016   ALKPHOS 78 02/21/2016   BILITOT 0.7 02/21/2016   Lab Results  Component Value Date   CHOL 148 07/03/2016   Lab Results  Component Value Date   HDL 41.40 07/03/2016   Lab Results  Component Value Date   LDLCALC 72 07/03/2016   Lab Results  Component Value Date   TRIG 172.0 (H) 07/03/2016   Lab Results  Component Value Date   CHOLHDL 4 07/03/2016   Lab Results  Component Value Date   HGBA1C 7.1 11/16/2016    IMPRESSION AND PLAN:  1) DM 2:  HbA1c  2) HTN:  3) HLD:  Due for repeat FLP next f/u in 3-4 mo.  4) CRI II/III: recent lytes about 1 week ago were normal, Cr/GFR stable.  An After Visit Summary was printed and given to the patient.  FOLLOW UP: No Follow-up on file.  Signed:  Crissie Sickles, MD           02/28/2017

## 2017-03-01 ENCOUNTER — Ambulatory Visit (INDEPENDENT_AMBULATORY_CARE_PROVIDER_SITE_OTHER): Payer: Medicare Other | Admitting: Family Medicine

## 2017-03-01 ENCOUNTER — Encounter: Payer: Self-pay | Admitting: Family Medicine

## 2017-03-01 ENCOUNTER — Other Ambulatory Visit: Payer: Self-pay | Admitting: Family Medicine

## 2017-03-01 VITALS — BP 113/77 | HR 74 | Temp 97.9°F | Resp 16 | Ht 71.0 in | Wt 223.8 lb

## 2017-03-01 DIAGNOSIS — I10 Essential (primary) hypertension: Secondary | ICD-10-CM

## 2017-03-01 DIAGNOSIS — N183 Chronic kidney disease, stage 3 unspecified: Secondary | ICD-10-CM

## 2017-03-01 DIAGNOSIS — M1711 Unilateral primary osteoarthritis, right knee: Secondary | ICD-10-CM

## 2017-03-01 DIAGNOSIS — E118 Type 2 diabetes mellitus with unspecified complications: Secondary | ICD-10-CM

## 2017-03-01 LAB — POCT GLYCOSYLATED HEMOGLOBIN (HGB A1C): Hemoglobin A1C: 7

## 2017-03-01 NOTE — Progress Notes (Signed)
OFFICE VISIT  03/01/2017   CC:  Chief Complaint  Patient presents with  . Follow-up    RCI, pt is fasting.    HPI:    Patient is a 76 y.o. Caucasian male who presents for 3 mo f/u DM 2, HTN, CRI stage III. He has permanent A-fib and NICM followed by cardiology.  DM 2: occ glucose checks running 135-180.  Some fasting checks and some 2H PP. Not eating ideal diabetic diet.  Recent prolonged period of "pigging out" when visiting [place].  HTN: home bp checks 135/80s.  CRI III: avoids NSAIDs.  Admits he doesn't drink enough clear fluids.  Of note, his knee pain that I saw him for recently is completely resolved after taking prednisone.  Past Medical History:  Diagnosis Date  . AICD (automatic cardioverter/defibrillator) present   . Arthritis    left shoulder (Dr. [name]): steroid injection 04/03/16.  Plan for TSA surgery 06/2016.  [name] BPH (benign prostatic hypertrophy) 06/2011   Irritative sx's; pt declined trial of anticholinergic per Urology records  . CHF (congestive heart failure) ([place])   . Chronic combined systolic and diastolic heart failure ([place]) 05/31/2012   Nonischemic:  EF 40-45%, LA mod-severe dilated, AFIB.   02/2016 EF 40%, diffuse hypokinesis, grade 2 DD.  Myoc perf imaging showed EF 32% 04/2016.  Pt upgraded to CRT-D 01/04/17.  [name] Chronic low back pain   . Chronic renal insufficiency, stage III (moderate) 2015   CrCl about 60 ml/min  . Depression   . DOE (dyspnea on exertion)    NYHA class I CHF  . Dyspnea    with exertion  . Episodic low back pain 01/22/2013   w/intermittent radiculitis (12/2014 his neurologist referred him to pain mgmt for epidural steroid injection)  . GERD (gastroesophageal reflux disease)   . H/O tilt table evaluation 11/02/05   negative  . Helicobacter pylori gastritis 01/2016  . High cholesterol   . History of adenomatous polyp of colon 10/12/11   Dr. [name] (3 right side of colon- tubular adenomas removed)  . History of cardiovascular  stress test 05/28/12   no ischemia, EF 37%, imaging results are unchanged and within normal variance  . History of chronic prostatitis   . History of kidney stones   . History of vertigo    + Hx of posterior HA's.  Neuro (Dr. Erling Cruz) eval 2011.  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  . Hypertension   . Lumbar spondylosis    lumbosacral radiculopathy at L4 by EMG testing, right foot drop (neurologist is Dr. Linus Salmons with [facility] in [place])--neurologist referred him to neurosurgery  . Migraine    "used to have them all the time; none for years" (01/04/2017)  . Myocardial infarction [facility]) ?1970s  . Nephrolithiasis 07/2012   Left UVJ 2 mm stone with dilation of renal collecting system and slight hydroureter on right  . Neuropathy   . 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  Since no CAD on cath 05/2016, cards recommends upgrade to CRT-D.  [name] Permanent atrial fibrillation ([place])    DCCV 07/09/13-converted, lasted two days, then back into afib--needs lifetime anticoagulation (Xarelto as of 09/2014)  . Skin cancer of arm, left    "burned it off" (01/04/2017)  . Type II diabetes mellitus ([place])     Past Surgical History:  Procedure Laterality Date  . BACK SURGERY    . BI-VENTRICULAR IMPLANTABLE  CARDIOVERTER DEFIBRILLATOR  (CRT-D) Left 01/04/2017  . BIV ICD INSERTION CRT-D N/A 01/04/2017   Procedure: BiV ICD ;  Surgeon: Constance Haw, MD;  Location: [place] [facility] LAB;  Service: Cardiovascular;  Laterality: N/A;  . CARDIAC CATHETERIZATION N/A 06/14/2016   Minimal nonobstructive dz, EF 25-35%.  Procedure: Left Heart Cath and Coronary Angiography;  Surgeon: Peter M Martinique, MD;  Location: [place] [facility];  Service: Cardiovascular;  Laterality: N/A;  . CARDIOVASCULAR STRESS TEST  2012   2012 nuclear perfusion study: low risk scan; 04/2016 normal  myocardial perfusion imaging, EF 32%.  [name] CARDIOVERSION  07/09/2012   Procedure: CARDIOVERSION;  Surgeon: [name], MD;  Location: [place];  Service: Cardiovascular;  Laterality: N/A;  . CATARACT EXTRACTION W/ INTRAOCULAR LENS IMPLANT & ANTERIOR VITRECTOMY, BILATERAL Bilateral   . COLONOSCOPY W/ POLYPECTOMY  approx 2006; repeated 09/2011   Polyps on 2013 EGD as well, repeat 12/2014  . ESOPHAGOGASTRODUODENOSCOPY  10/18/06   Done due to chronic GERD: Normal, bx showed no barrett's esophagus (Dr. [name])  . FLEXOR TENDON REPAIR Left 10/02/2016   Procedure: LEFT RING FINGER WOUND EXPORATION AND FLEXOR TENDON REPAIR AND NERVE REPAIR;  Surgeon: Milly Jakob, MD;  Location: [place];  Service: Orthopedics;  Laterality: Left;  . INSERT / REPLACE / REMOVE PACEMAKER  02/05/2012   dual chamber, sinus node dysfunction, sinus arrest, PAF, Medtronic Revo serial#-[id number] H: last checked 05/2015  . LUMBAR LAMINECTOMY Left 1976   L4-5  . PACEMAKER REMOVAL  01/04/2017  . PERMANENT PACEMAKER INSERTION N/A 02/05/2012   Procedure: PERMANENT PACEMAKER INSERTION;  Surgeon: [name], MD; Generator Medtronic Revo model [place] serial number [id number] H Laterality: N/A;  . RETINAL DETACHMENT SURGERY Left ~ 1999  . REVERSE SHOULDER ARTHROPLASTY Left 2018   Left shoulder reverse TSA [name] [place]  . TRANSTHORACIC ECHOCARDIOGRAM  08/25/10; 05/2012; 03/23/16   mild asymmetric LVH, normal systolic function, normal diastolic fxn, mild-to-mod mitral regurg, mild aortic valve sclerosis and trace AI, mild aortic root dilatation. 2014 f/u showed EF 40-45%, mod LAE, A FIB.  02/2016 EF 40%, diffuse hypokinesis, grade 2 DD.    Outpatient Medications Prior to Visit  Medication Sig Dispense Refill  . acetaminophen (TYLENOL) 325 MG tablet Take 2 tablets (650 mg total) by mouth every 6 (six) hours as needed for mild pain or moderate pain.    [name] colchicine 0.6 MG tablet Take 0.6 mg by mouth daily as needed (for  gout).     . COMBIGAN 0.2-0.5 % ophthalmic solution Place 1 drop into both eyes 2 (two) times daily.     [name] ENTRESTO 97-103 MG Take 1 tablet by mouth 2 (two) times daily. 180 tablet 1  . gabapentin (NEURONTIN) 300 MG capsule TAKE 1 CAPSULE THREE TIMES A DAY (Patient taking differently: Take 2 capsules in the AM and 1 capsule at bedtime.) 270 capsule 3  . GLIPIZIDE XL 5 MG 24 hr tablet TAKE 1 TABLET EVERY MORNING 90 tablet 0  . glucose blood test strip Use to check blood sugars 1-2 times daily 100 each 1  . JANUVIA 100 MG tablet TAKE 1 TABLET DAILY 90 tablet 0  . Lancets (PRECISION ULTRA LANCET) MISC Use as instructed 100 each 3  . latanoprost (XALATAN) 0.005 % ophthalmic solution Place 1 drop into both eyes at bedtime.   12  . meclizine (ANTIVERT) 25 MG tablet Take 25 mg by mouth 3 (three) times daily as needed for dizziness.    . metFORMIN (GLUCOPHAGE) 1000 MG tablet  TAKE 1 TABLET TWICE A DAY 180 tablet 0  . metoprolol succinate (TOPROL-XL) 100 MG 24 hr tablet 1 and 1/2 tabs po qd (Patient taking differently: Take 50-100 mg by mouth See admin instructions. Take 100 mg by mouth in the morning and take 50 mg by mouth at night) 135 tablet 3  . omeprazole (PRILOSEC) 40 MG capsule TAKE 1 CAPSULE DAILY 90 capsule 0  . ONE TOUCH LANCETS MISC Use to check blood sugar 1-2 times daily 200 each 0  . rOPINIRole (REQUIP) 1 MG tablet TAKE 1 TABLET DAILY (NEED OFFICE VISIT FOR MORE REFILLS) (Patient taking differently: Take 1mg  at bedtime) 90 tablet 1  . sertraline (ZOLOFT) 100 MG tablet TAKE 1 TABLET DAILY 90 tablet 1  . simvastatin (ZOCOR) 20 MG tablet TAKE 1 TABLET EVERY EVENING 90 tablet 0  . XARELTO 20 MG TABS tablet TAKE 1 TABLET DAILY WITH SUPPER 90 tablet 1  . predniSONE (DELTASONE) 20 MG tablet 2 tabs po qd (Patient not taking: Reported on 03/01/2017) 10 tablet 0   No facility-administered medications prior to visit.     No Known Allergies  ROS As per HPI  PE: Blood pressure 113/77, pulse 74,  temperature 97.9 F (36.6 C), temperature source Oral, resp. rate 16, height 5\' 11"  (1.803 m), weight 223 lb 12 oz (101.5 kg), SpO2 97 %. Gen: Alert, well appearing.  Patient is oriented to person, place, time, and situation. AFFECT: pleasant, lucid thought and speech. No further exam today.  LABS:  Lab Results  Component Value Date   TSH 3.37 06/06/2016   Lab Results  Component Value Date   WBC 7.0 02/19/2017   HGB 13.5 02/19/2017   HCT 40.9 02/19/2017   MCV 90.2 02/19/2017   PLT 145.0 (L) 02/19/2017   Lab Results  Component Value Date   CREATININE 1.20 02/19/2017   BUN 15 02/19/2017   NA 139 02/19/2017   K 4.8 02/19/2017   CL 101 02/19/2017   CO2 30 02/19/2017  GFR 63 ml/min on 02/19/17. Lab Results  Component Value Date   ALT 17 02/21/2016   AST 17 02/21/2016   ALKPHOS 78 02/21/2016   BILITOT 0.7 02/21/2016   Lab Results  Component Value Date   CHOL 148 07/03/2016   Lab Results  Component Value Date   HDL 41.40 07/03/2016   Lab Results  Component Value Date   LDLCALC 72 07/03/2016   Lab Results  Component Value Date   TRIG 172.0 (H) 07/03/2016   Lab Results  Component Value Date   CHOLHDL 4 07/03/2016   Lab Results  Component Value Date   HGBA1C 7.1 11/16/2016   POC A1c today: 7.0%  IMPRESSION AND PLAN:  1) DM 2, historically with pretty decent control. Dietary indiscretion for quite a while lately. He seems to have gotten back on track---has lost 10 lbs in the last 10d or so. POC A1c today: 7%.  No changes in meds today---encouraged more compliance with diet, increase activity.  2) HTN; The current medical regimen is effective;  continue present plan and medications. Lytes/cr good 02/19/17.  3) CRI stage III: avoids NSAIDs.  Needs to focus on adequate hydration with clear fluids. GFR stable at 63 ml/min on 02/19/17---no repeat needed today.  4) Right knee arthritis, seen here for this 02/19/17, rx'd prednisone and he improved quickly and this  is totally resolved now.  An After Visit Summary was printed and given to the patient.  FOLLOW UP: Return in about 3 months (  around 06/01/2017) for routine chronic illness f/u.  Signed:  Crissie Sickles, MD           03/01/2017

## 2017-03-01 NOTE — Telephone Encounter (Signed)
[facility]

## 2017-03-05 ENCOUNTER — Telehealth: Payer: Self-pay

## 2017-03-05 NOTE — Telephone Encounter (Signed)
Referred to ICM clinic by Memory Dance, Device RN and agreed upon by Dr Sallyanne Kuster.  Attempted ICM intro call to patient and line busy x 2.  Will attempt a call back at later time.

## 2017-03-05 NOTE — Telephone Encounter (Signed)
Error, duplicate

## 2017-03-06 DIAGNOSIS — S61215D Laceration without foreign body of left ring finger without damage to nail, subsequent encounter: Secondary | ICD-10-CM | POA: Diagnosis not present

## 2017-03-06 DIAGNOSIS — S64495D Injury of digital nerve of left ring finger, subsequent encounter: Secondary | ICD-10-CM | POA: Diagnosis not present

## 2017-03-08 ENCOUNTER — Other Ambulatory Visit: Payer: Self-pay | Admitting: Family Medicine

## 2017-03-08 NOTE — Telephone Encounter (Signed)
[facility]

## 2017-03-13 NOTE — Telephone Encounter (Signed)
Attempted ICM call to patient and line busy x 2

## 2017-03-16 DIAGNOSIS — S64495D Injury of digital nerve of left ring finger, subsequent encounter: Secondary | ICD-10-CM | POA: Diagnosis not present

## 2017-03-16 DIAGNOSIS — S66195D Other injury of flexor muscle, fascia and tendon of left ring finger at wrist and hand level, subsequent encounter: Secondary | ICD-10-CM | POA: Diagnosis not present

## 2017-03-16 DIAGNOSIS — S61215D Laceration without foreign body of left ring finger without damage to nail, subsequent encounter: Secondary | ICD-10-CM | POA: Diagnosis not present

## 2017-03-19 ENCOUNTER — Other Ambulatory Visit: Payer: Self-pay | Admitting: Cardiovascular Disease

## 2017-03-19 DIAGNOSIS — S64495D Injury of digital nerve of left ring finger, subsequent encounter: Secondary | ICD-10-CM | POA: Diagnosis not present

## 2017-03-19 DIAGNOSIS — S61215D Laceration without foreign body of left ring finger without damage to nail, subsequent encounter: Secondary | ICD-10-CM | POA: Diagnosis not present

## 2017-03-19 DIAGNOSIS — S66195D Other injury of flexor muscle, fascia and tendon of left ring finger at wrist and hand level, subsequent encounter: Secondary | ICD-10-CM | POA: Diagnosis not present

## 2017-03-19 NOTE — Telephone Encounter (Signed)
Rx request sent to pharmacy.  

## 2017-03-20 NOTE — Telephone Encounter (Signed)
Unable to reach patient for ICM follow up.  Will have device nurse refer patient after office visit with Dr Mason [name] on 9/26 if patient agreeable

## 2017-03-22 DIAGNOSIS — S64495D Injury of digital nerve of left ring finger, subsequent encounter: Secondary | ICD-10-CM | POA: Diagnosis not present

## 2017-03-22 DIAGNOSIS — S66195D Other injury of flexor muscle, fascia and tendon of left ring finger at wrist and hand level, subsequent encounter: Secondary | ICD-10-CM | POA: Diagnosis not present

## 2017-03-22 DIAGNOSIS — S61215D Laceration without foreign body of left ring finger without damage to nail, subsequent encounter: Secondary | ICD-10-CM | POA: Diagnosis not present

## 2017-03-27 DIAGNOSIS — H401112 Primary open-angle glaucoma, right eye, moderate stage: Secondary | ICD-10-CM | POA: Diagnosis not present

## 2017-03-27 DIAGNOSIS — S61215D Laceration without foreign body of left ring finger without damage to nail, subsequent encounter: Secondary | ICD-10-CM | POA: Diagnosis not present

## 2017-03-27 DIAGNOSIS — H35372 Puckering of macula, left eye: Secondary | ICD-10-CM | POA: Diagnosis not present

## 2017-03-27 DIAGNOSIS — E119 Type 2 diabetes mellitus without complications: Secondary | ICD-10-CM | POA: Diagnosis not present

## 2017-03-27 DIAGNOSIS — H02834 Dermatochalasis of left upper eyelid: Secondary | ICD-10-CM | POA: Diagnosis not present

## 2017-03-27 DIAGNOSIS — H401123 Primary open-angle glaucoma, left eye, severe stage: Secondary | ICD-10-CM | POA: Diagnosis not present

## 2017-03-27 DIAGNOSIS — Z961 Presence of intraocular lens: Secondary | ICD-10-CM | POA: Diagnosis not present

## 2017-03-27 DIAGNOSIS — S66195D Other injury of flexor muscle, fascia and tendon of left ring finger at wrist and hand level, subsequent encounter: Secondary | ICD-10-CM | POA: Diagnosis not present

## 2017-03-27 DIAGNOSIS — S64495D Injury of digital nerve of left ring finger, subsequent encounter: Secondary | ICD-10-CM | POA: Diagnosis not present

## 2017-03-27 DIAGNOSIS — H02831 Dermatochalasis of right upper eyelid: Secondary | ICD-10-CM | POA: Diagnosis not present

## 2017-03-27 LAB — HM DIABETES EYE EXAM

## 2017-03-29 ENCOUNTER — Encounter: Payer: Self-pay | Admitting: Family Medicine

## 2017-03-30 DIAGNOSIS — S66195D Other injury of flexor muscle, fascia and tendon of left ring finger at wrist and hand level, subsequent encounter: Secondary | ICD-10-CM | POA: Diagnosis not present

## 2017-03-30 DIAGNOSIS — S61215D Laceration without foreign body of left ring finger without damage to nail, subsequent encounter: Secondary | ICD-10-CM | POA: Diagnosis not present

## 2017-03-30 DIAGNOSIS — S64495D Injury of digital nerve of left ring finger, subsequent encounter: Secondary | ICD-10-CM | POA: Diagnosis not present

## 2017-04-02 ENCOUNTER — Other Ambulatory Visit: Payer: Self-pay | Admitting: Family Medicine

## 2017-04-02 NOTE — Telephone Encounter (Signed)
[facility]  RF request for ropinirole LOV: 03/01/17 Next ov: 06/29/17 Last written: 05/31/16 #90 w/ 1RF  Please advise. Thanks.

## 2017-04-05 DIAGNOSIS — S66195D Other injury of flexor muscle, fascia and tendon of left ring finger at wrist and hand level, subsequent encounter: Secondary | ICD-10-CM | POA: Diagnosis not present

## 2017-04-05 DIAGNOSIS — S64495D Injury of digital nerve of left ring finger, subsequent encounter: Secondary | ICD-10-CM | POA: Diagnosis not present

## 2017-04-05 DIAGNOSIS — S61215D Laceration without foreign body of left ring finger without damage to nail, subsequent encounter: Secondary | ICD-10-CM | POA: Diagnosis not present

## 2017-04-09 DIAGNOSIS — S64495D Injury of digital nerve of left ring finger, subsequent encounter: Secondary | ICD-10-CM | POA: Diagnosis not present

## 2017-04-09 DIAGNOSIS — S61215D Laceration without foreign body of left ring finger without damage to nail, subsequent encounter: Secondary | ICD-10-CM | POA: Diagnosis not present

## 2017-04-09 DIAGNOSIS — S66195D Other injury of flexor muscle, fascia and tendon of left ring finger at wrist and hand level, subsequent encounter: Secondary | ICD-10-CM | POA: Diagnosis not present

## 2017-04-11 DIAGNOSIS — S66195D Other injury of flexor muscle, fascia and tendon of left ring finger at wrist and hand level, subsequent encounter: Secondary | ICD-10-CM | POA: Diagnosis not present

## 2017-04-11 DIAGNOSIS — S61215D Laceration without foreign body of left ring finger without damage to nail, subsequent encounter: Secondary | ICD-10-CM | POA: Diagnosis not present

## 2017-04-11 DIAGNOSIS — S64495D Injury of digital nerve of left ring finger, subsequent encounter: Secondary | ICD-10-CM | POA: Diagnosis not present

## 2017-04-16 DIAGNOSIS — S66195D Other injury of flexor muscle, fascia and tendon of left ring finger at wrist and hand level, subsequent encounter: Secondary | ICD-10-CM | POA: Diagnosis not present

## 2017-04-16 DIAGNOSIS — S61215D Laceration without foreign body of left ring finger without damage to nail, subsequent encounter: Secondary | ICD-10-CM | POA: Diagnosis not present

## 2017-04-16 DIAGNOSIS — S64495D Injury of digital nerve of left ring finger, subsequent encounter: Secondary | ICD-10-CM | POA: Diagnosis not present

## 2017-04-17 DIAGNOSIS — S61215D Laceration without foreign body of left ring finger without damage to nail, subsequent encounter: Secondary | ICD-10-CM | POA: Diagnosis not present

## 2017-04-18 ENCOUNTER — Encounter: Payer: Medicare Other | Admitting: Cardiology

## 2017-04-18 ENCOUNTER — Encounter: Payer: Self-pay | Admitting: Cardiology

## 2017-04-18 ENCOUNTER — Ambulatory Visit (INDEPENDENT_AMBULATORY_CARE_PROVIDER_SITE_OTHER): Payer: Medicare Other | Admitting: Cardiology

## 2017-04-18 VITALS — BP 110/82 | HR 86 | Ht 71.0 in | Wt 232.6 lb

## 2017-04-18 DIAGNOSIS — I482 Chronic atrial fibrillation: Secondary | ICD-10-CM | POA: Diagnosis not present

## 2017-04-18 DIAGNOSIS — I428 Other cardiomyopathies: Secondary | ICD-10-CM | POA: Diagnosis not present

## 2017-04-18 DIAGNOSIS — I442 Atrioventricular block, complete: Secondary | ICD-10-CM | POA: Diagnosis not present

## 2017-04-18 DIAGNOSIS — I10 Essential (primary) hypertension: Secondary | ICD-10-CM | POA: Diagnosis not present

## 2017-04-18 DIAGNOSIS — I5022 Chronic systolic (congestive) heart failure: Secondary | ICD-10-CM

## 2017-04-18 DIAGNOSIS — I4821 Permanent atrial fibrillation: Secondary | ICD-10-CM

## 2017-04-18 LAB — CUP PACEART INCLINIC DEVICE CHECK
Battery Remaining Longevity: 87 mo
Battery Voltage: 3.02 V
Brady Statistic AP VP Percent: 0 %
Brady Statistic AP VS Percent: 0 %
Brady Statistic AS VP Percent: 0 %
Brady Statistic AS VS Percent: 0 %
Brady Statistic RA Percent Paced: 0 %
Brady Statistic RV Percent Paced: 99.95 %
Date Time Interrogation Session: 20180926110041
HighPow Impedance: 66 Ohm
Implantable Lead Implant Date: 20130715
Implantable Lead Implant Date: 20180614
Implantable Lead Implant Date: 20180614
Implantable Lead Location: 753858
Implantable Lead Location: 753859
Implantable Lead Location: 753860
Implantable Lead Model: 4598
Implantable Pulse Generator Implant Date: 20180614
Lead Channel Impedance Value: 209 Ohm
Lead Channel Impedance Value: 209 Ohm
Lead Channel Impedance Value: 209 Ohm
Lead Channel Impedance Value: 218.087
Lead Channel Impedance Value: 218.087
Lead Channel Impedance Value: 361 Ohm
Lead Channel Impedance Value: 418 Ohm
Lead Channel Impedance Value: 418 Ohm
Lead Channel Impedance Value: 418 Ohm
Lead Channel Impedance Value: 456 Ohm
Lead Channel Impedance Value: 513 Ohm
Lead Channel Impedance Value: 513 Ohm
Lead Channel Impedance Value: 608 Ohm
Lead Channel Impedance Value: 760 Ohm
Lead Channel Impedance Value: 760 Ohm
Lead Channel Impedance Value: 779 Ohm
Lead Channel Impedance Value: 779 Ohm
Lead Channel Impedance Value: 817 Ohm
Lead Channel Pacing Threshold Amplitude: 0.75 V
Lead Channel Pacing Threshold Amplitude: 0.75 V
Lead Channel Pacing Threshold Pulse Width: 0.4 ms
Lead Channel Pacing Threshold Pulse Width: 1 ms
Lead Channel Sensing Intrinsic Amplitude: 0.625 mV
Lead Channel Setting Pacing Amplitude: 1.25 V
Lead Channel Setting Pacing Amplitude: 2 V
Lead Channel Setting Pacing Pulse Width: 0.4 ms
Lead Channel Setting Pacing Pulse Width: 1 ms
Lead Channel Setting Sensing Sensitivity: 0.3 mV

## 2017-04-18 NOTE — Progress Notes (Signed)
Electrophysiology Office Note   Date:  04/18/2017   ID:  Mason [name], DOB 04-27-1941, MRN [id number]  PCP:  Tammi Sou, MD  Cardiologist:  [place] Primary Electrophysiologist:  Uchechukwu Dhawan Meredith Leeds, MD    Chief Complaint  Patient presents with  . Defib Check    Nonischemic cardiomyopathy/Chronic systolic HF/Complete heart block     History of Present Illness: Mason [name] is a 76 y.o. male who presents today for electrophysiology evaluation.   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 He showed a gradual decline in LV systolic function and has recently developed exertional dyspnea and heart failure symptoms. An echocardiogram in August showed an EF of 40% with diffuse hypokinesis. In October he had a nuclear stress test that showed all perfusion but an EF of 32%. Has minor coronary atherosclerosis and normal LV EDP. He had a dual chamber Medtronic Revo pacemaker implanted July 2013. Due to 100% V pacing and a drop in his EF, had CRT-D upgrade on 01/04/17.   Today, denies symptoms of palpitations, chest pain,  lower extremity edema, claudication, dizziness, presyncope, syncope, bleeding, or neurologic sequela. The patient is tolerating medications without difficulties. He has been feeling worse since his device was implanted. He has been having more fatigue, weakness, and shortness of breath. He has to stop a few times when walking up his driveway which has a small hill.    Past Medical History:  Diagnosis Date  . AICD (automatic cardioverter/defibrillator) present   . Arthritis    left shoulder (Dr. [name]): steroid injection 04/03/16.  Plan for TSA surgery 06/2016.  [name] BPH (benign prostatic hypertrophy) 06/2011   Irritative sx's; pt declined trial of anticholinergic per Urology records  . CHF (congestive heart failure) ([place])   . Chronic combined systolic and diastolic heart failure ([place]) 05/31/2012   Nonischemic:  EF 40-45%, LA mod-severe  dilated, AFIB.   02/2016 EF 40%, diffuse hypokinesis, grade 2 DD.  Myoc perf imaging showed EF 32% 04/2016.  Pt upgraded to CRT-D 01/04/17.  [name] Chronic low back pain   . Chronic renal insufficiency, stage III (moderate) 2015   CrCl about 60 ml/min  . Depression   . DOE (dyspnea on exertion)    NYHA class I CHF  . Dyspnea    with exertion  . Episodic low back pain 01/22/2013   w/intermittent radiculitis (12/2014 his neurologist referred him to pain mgmt for epidural steroid injection)  . GERD (gastroesophageal reflux disease)   . H/O tilt table evaluation 11/02/05   negative  . Helicobacter pylori gastritis 01/2016  . High cholesterol   . History of adenomatous polyp of colon 10/12/11   Dr. [name] (3 right side of colon- tubular adenomas removed)  . History of cardiovascular stress test 05/28/12   no ischemia, EF 37%, imaging results are unchanged and within normal variance  . History of chronic prostatitis   . History of kidney stones   . History of vertigo    + Hx of posterior HA's.  Neuro (Dr. Erling Cruz) eval 2011.  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  . Hypertension   . Lumbar spondylosis    lumbosacral radiculopathy at L4 by EMG testing, right foot drop (neurologist is Dr. Linus Salmons with [facility] in [place])--neurologist referred him to neurosurgery  . Migraine    "used to have them all the time; none for years" (01/04/2017)  . Myocardial infarction [facility]) ?1970s  . Nephrolithiasis  07/2012   Left UVJ 2 mm stone with dilation of renal collecting system and slight hydroureter on right  . Neuropathy   . 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  Since no CAD on cath 05/2016, cards recommends upgrade to CRT-D.  [name] Permanent atrial fibrillation ([place])    DCCV 07/09/13-converted, lasted two days, then back into afib--needs lifetime anticoagulation  (Xarelto as of 09/2014)  . Skin cancer of arm, left    "burned it off" (01/04/2017)  . Type II diabetes mellitus ([place])    Past Surgical History:  Procedure Laterality Date  . BACK SURGERY    . BI-VENTRICULAR IMPLANTABLE CARDIOVERTER DEFIBRILLATOR  (CRT-D) Left 01/04/2017  . BIV ICD INSERTION CRT-D N/A 01/04/2017   Procedure: BiV ICD ;  Surgeon: Constance Haw, MD;  Location: [facility];  Service: Cardiovascular;  Laterality: N/A;  . CARDIAC CATHETERIZATION N/A 06/14/2016   Minimal nonobstructive dz, EF 25-35%.  Procedure: Left Heart Cath and Coronary Angiography;  Surgeon: Peter M Martinique, MD;  Location: [facility];  Service: Cardiovascular;  Laterality: N/A;  . CARDIOVASCULAR STRESS TEST  2012   2012 nuclear perfusion study: low risk scan; 04/2016 normal myocardial perfusion imaging, EF 32%.  [name] CARDIOVERSION  07/09/2012   Procedure: CARDIOVERSION;  Surgeon: [name], MD;  Location: [facility];  Service: Cardiovascular;  Laterality: N/A;  . CATARACT EXTRACTION W/ INTRAOCULAR LENS IMPLANT & ANTERIOR VITRECTOMY, BILATERAL Bilateral   . COLONOSCOPY W/ POLYPECTOMY  approx 2006; repeated 09/2011   Polyps on 2013 EGD as well, repeat 12/2014  . ESOPHAGOGASTRODUODENOSCOPY  10/18/06   Done due to chronic GERD: Normal, bx showed no barrett's esophagus (Dr. [name])  . FLEXOR TENDON REPAIR Left 10/02/2016   Procedure: LEFT RING FINGER WOUND EXPORATION AND FLEXOR TENDON REPAIR AND NERVE REPAIR;  Surgeon: Milly Jakob, MD;  Location: [facility];  Service: Orthopedics;  Laterality: Left;  . INSERT / REPLACE / REMOVE PACEMAKER  02/05/2012   dual chamber, sinus node dysfunction, sinus arrest, PAF, Medtronic Revo serial#-[id number] H: last checked 05/2015  . LUMBAR LAMINECTOMY Left 1976   L4-5  . PACEMAKER REMOVAL  01/04/2017  . PERMANENT PACEMAKER INSERTION N/A 02/05/2012   Procedure: PERMANENT PACEMAKER INSERTION;  Surgeon: [name], MD; Generator Medtronic Revo model [place] serial  number [id number] H Laterality: N/A;  . RETINAL DETACHMENT SURGERY Left ~ 1999  . REVERSE SHOULDER ARTHROPLASTY Left 2018   Left shoulder reverse TSA [name] in [place]).  . TRANSTHORACIC ECHOCARDIOGRAM  08/25/10; 05/2012; 03/23/16   mild asymmetric LVH, normal systolic function, normal diastolic fxn, mild-to-mod mitral regurg, mild aortic valve sclerosis and trace AI, mild aortic root dilatation. 2014 f/u showed EF 40-45%, mod LAE, A FIB.  02/2016 EF 40%, diffuse hypokinesis, grade 2 DD.     Current Outpatient Prescriptions  Medication Sig Dispense Refill  . acetaminophen (TYLENOL) 325 MG tablet Take 2 tablets (650 mg total) by mouth every 6 (six) hours as needed for mild pain or moderate pain.    [name] colchicine 0.6 MG tablet Take 0.6 mg by mouth daily as needed (for gout).     . COMBIGAN 0.2-0.5 % ophthalmic solution Place 1 drop into both eyes 2 (two) times daily.     [name] ENTRESTO 97-103 MG TAKE 1 TABLET TWICE A DAY 180 tablet 1  . gabapentin (NEURONTIN) 300 MG capsule TAKE 1 CAPSULE THREE TIMES A DAY (Patient taking differently: Take 2 capsules in the AM and 1 capsule at  bedtime.) 270 capsule 3  . GLIPIZIDE XL 5 MG 24 hr tablet TAKE 1 TABLET EVERY MORNING 90 tablet 0  . glucose blood test strip Use to check blood sugars 1-2 times daily 100 each 1  . JANUVIA 100 MG tablet TAKE 1 TABLET DAILY 90 tablet 0  . Lancets (PRECISION ULTRA LANCET) MISC Use as instructed 100 each 3  . latanoprost (XALATAN) 0.005 % ophthalmic solution Place 1 drop into both eyes at bedtime.   12  . meclizine (ANTIVERT) 25 MG tablet Take 25 mg by mouth 3 (three) times daily as needed for dizziness.    . metFORMIN (GLUCOPHAGE) 1000 MG tablet TAKE 1 TABLET TWICE A DAY 180 tablet 0  . omeprazole (PRILOSEC) 40 MG capsule TAKE 1 CAPSULE DAILY 90 capsule 0  . ONE TOUCH LANCETS MISC Use to check blood sugar 1-2 times daily 200 each 0  . rOPINIRole (REQUIP) 1 MG tablet Take 1 tablet (1 mg total) by mouth daily. 90 tablet 3    . sertraline (ZOLOFT) 100 MG tablet TAKE 1 TABLET DAILY 90 tablet 1  . simvastatin (ZOCOR) 20 MG tablet TAKE 1 TABLET EVERY EVENING 90 tablet 0  . TOPROL XL 100 MG 24 hr tablet TAKE ONE AND ONE-HALF TABLETS DAILY 135 tablet 1  . XARELTO 20 MG TABS tablet TAKE 1 TABLET DAILY WITH SUPPER 90 tablet 1   No current facility-administered medications for this visit.     Allergies:   Patient has no known allergies.   Social History:  The patient  reports that he has never smoked. He has never used smokeless tobacco. He reports that he drinks about 5.4 oz of alcohol per week . He reports that he does not use drugs.   Family History:  The patient's family history includes Heart failure in his mother; Stroke in his father and mother.    ROS:  Please see the history of present illness.   Otherwise, review of systems is positive for sweats, fatigue, appetite change, shortness of breath, depression, back pain, dizziness.   All other systems are reviewed and negative.   PHYSICAL EXAM: VS:  BP 110/82   Pulse 86   Ht 5\' 11"  (1.803 m)   Wt 232 lb 9.6 oz (105.5 kg)   BMI 32.44 kg/m  , BMI Body mass index is 32.44 kg/m. GEN: Well nourished, well developed, in no acute distress  HEENT: normal  Neck: no JVD, carotid bruits, or masses Cardiac: RRR; no murmurs, rubs, or gallops,no edema  Respiratory:  clear to auscultation bilaterally, normal work of breathing GI: soft, nontender, nondistended, + BS MS: no deformity or atrophy  Skin: warm and dry, device site well healed Neuro:  Strength and sensation are intact Psych: euthymic mood, full affect  EKG:  EKG is ordered today. Personal review of the ekg ordered shows ventricular paced, sinus rhythm  Personal review of the device interrogation today. Results in [place]: 06/06/2016: TSH 3.37 02/19/2017: BUN 15; Creatinine, Ser 1.20; Hemoglobin 13.5; Platelets 145.0; Potassium 4.8; Sodium 139    Lipid Panel     Component Value  Date/Time   CHOL 148 07/03/2016 0832   TRIG 172.0 (H) 07/03/2016 0832   HDL 41.40 07/03/2016 0832   CHOLHDL 4 07/03/2016 0832   VLDL 34.4 07/03/2016 0832   LDLCALC 72 07/03/2016 0832     Wt Readings from Last 3 Encounters:  04/18/17 232 lb 9.6 oz (105.5 kg)  03/01/17 223 lb 12 oz (101.5 kg)  02/19/17 233 lb 4 oz (105.8 kg)      Other studies Reviewed: Additional studies/ records that were reviewed today include: Cardiac cath 06/14/16, TTE 03/23/16  Review of the above records today demonstrates:   There is severe left ventricular systolic dysfunction.  The left ventricular ejection fraction is 25-35% by visual estimate.  LV end diastolic pressure is normal.   1. Minimal nonobstructive CAD 2. Severe LV dysfunction 3. Normal LV EDP  - Left ventricle: The cavity size was normal. Wall thickness was   increased in a pattern of mild LVH. The estimated ejection   fraction was 40%. Diffuse hypokinesis. Features are consistent   with a pseudonormal left ventricular filling pattern, with   concomitant abnormal relaxation and increased filling pressure   (grade 2 diastolic dysfunction). - Aortic valve: There was no stenosis. - Aorta: Ascending aortic diameter: 43 mm (S). - Ascending aorta: The ascending aorta was mildly dilated. - Mitral valve: There was trivial regurgitation. - Left atrium: The atrium was mildly dilated. - Right ventricle: The cavity size was normal. Pacer wire or   catheter noted in right ventricle. Systolic function was mildly   reduced. - Right atrium: The atrium was mildly dilated. - Tricuspid valve: Peak RV-RA gradient (S): 26 mm Hg. - Pulmonary arteries: PA peak pressure: 34 mm Hg (S). - Systemic veins: IVC measured 2.0 cm with < 50% respirophasic   variation, suggesting RA pressure 8 mmHg.   ASSESSMENT AND PLAN:  1.  Permanent atrial fibrillation: It to coagulation with Xarelto.  This patients CHA2DS2-VASc Score and unadjusted Ischemic Stroke Rate  (% per year) is equal to 4.8 % stroke rate/year from a score of 4  Above score calculated as 1 point each if present [CHF, HTN, DM, Vascular=MI/PAD/Aortic Plaque, Age if 65-74, or Male] Above score calculated as 2 points each if present [Age > 75, or Stroke/TIA/TE]  2. Systolic heart failure: EF dropped with 100% V pacing. Had CRT-D upgrade on 01/04/17. Patient is feeling more fatigued and short of breath since device implant. Unadjusted RV LV timing by 40 ms the LV early. If this does not help with his symptoms, have instructed him to call back to have an appointment for CRT optimization.  3. Complete AV block: Had exposed by V defibrillator upgrade.  4. Hypertension: Well-controlled  Current medicines are reviewed at length with the patient today.   The patient does not have concerns regarding his medicines.  The following changes were made today:  none  Labs/ tests ordered today include:  No orders of the defined types were placed in this encounter.    Disposition:   FU with Dorsey Charette 12 months  Signed, Mason Nauta Meredith Leeds, MD  04/18/2017 10:25 AM     [facility] [address] [place] [address] [phone] (office) [phone] (fax)

## 2017-04-18 NOTE — Patient Instructions (Addendum)
Medication Instructions:  Your physician recommends that you continue on your current medications as directed. Please refer to the Current Medication list given to you today.  --- If you need a refill on your cardiac medications before your next appointment, please call your pharmacy. ---  Labwork: None ordered  Testing/Procedures: None ordered  Follow-Up: Remote monitoring is used to monitor your Pacemaker of ICD from home. This monitoring reduces the number of office visits required to check your device to one time per year. It allows [place] to keep an eye on the functioning of your device to ensure it is working properly. You are scheduled for a device check from home on 07/19/2017. You may send your transmission at any time that day. If you have a wireless device, the transmission will be sent automatically. After your physician reviews your transmission, you will receive a postcard with your next transmission date.  Your physician wants you to follow-up in: 9 months with Dr. Curt Bears.  You will receive a reminder letter in the mail two months in advance. If you don't receive a letter, please call our office to schedule the follow-up appointment.   Any Other Special Instructions Will Be Listed Below (If Applicable). If you are still not feeling well in several weeks - call the office to make an appointment with Chanetta Marshall, NP only.    Thank you for choosing [facility]!!   Trinidad Curet, RN [phone]

## 2017-04-24 ENCOUNTER — Other Ambulatory Visit: Payer: Self-pay | Admitting: Cardiovascular Disease

## 2017-04-25 ENCOUNTER — Other Ambulatory Visit: Payer: Self-pay | Admitting: Family Medicine

## 2017-04-27 NOTE — Telephone Encounter (Signed)
Device nurse spoke with patient regarding ICM and is not interested at this time.  No ICM follow but will continue to be followed every 3 months by device clinic.

## 2017-05-02 ENCOUNTER — Telehealth: Payer: Self-pay | Admitting: Cardiology

## 2017-05-02 NOTE — Telephone Encounter (Signed)
New Message   Pt call requesting to speak with RN. Pt states he needs to speak with RN about getting some information from Dr. Curt Bears  To sent to the [facility] for is Disability. Please call back to discuss

## 2017-05-02 NOTE — Telephone Encounter (Signed)
Pt asking for a letter from Dr. Curt Bears stating he was upgraded to a CRT-D and is unable to work or has a limited functional capacity. Informed pt from EP standpoint, a ICD will not prevent him from working, per [employer]. However, since he has [facility] this may be a dx that will qualify him.  He understands I will forward to his general cardiologist, Dr. Sallyanne Kuster to see if he feels pt is limited functionally and should be working little/none.  He needs a letter from physician stating as much.  I will type a letter explaining need to upgrade to CRTD for pt.  He thanks me for that.  Will also get implant information to him for the [place]. Pt understands I will be in touch once I have heard from Dr. Sallyanne Kuster.

## 2017-05-07 ENCOUNTER — Ambulatory Visit: Payer: Medicare Other | Admitting: Family Medicine

## 2017-05-11 ENCOUNTER — Telehealth: Payer: Self-pay | Admitting: *Deleted

## 2017-05-11 ENCOUNTER — Encounter: Payer: Self-pay | Admitting: Family Medicine

## 2017-05-11 ENCOUNTER — Ambulatory Visit (INDEPENDENT_AMBULATORY_CARE_PROVIDER_SITE_OTHER): Payer: Medicare Other | Admitting: Family Medicine

## 2017-05-11 VITALS — BP 140/93 | HR 73 | Temp 98.0°F | Resp 16 | Ht 71.0 in | Wt 230.0 lb

## 2017-05-11 DIAGNOSIS — M5136 Other intervertebral disc degeneration, lumbar region: Secondary | ICD-10-CM | POA: Diagnosis not present

## 2017-05-11 DIAGNOSIS — G8929 Other chronic pain: Secondary | ICD-10-CM

## 2017-05-11 DIAGNOSIS — Z23 Encounter for immunization: Secondary | ICD-10-CM

## 2017-05-11 DIAGNOSIS — M545 Low back pain, unspecified: Secondary | ICD-10-CM

## 2017-05-11 DIAGNOSIS — M48061 Spinal stenosis, lumbar region without neurogenic claudication: Secondary | ICD-10-CM

## 2017-05-11 DIAGNOSIS — M4316 Spondylolisthesis, lumbar region: Secondary | ICD-10-CM | POA: Diagnosis not present

## 2017-05-11 NOTE — Progress Notes (Signed)
OFFICE VISIT  05/11/2017   CC:  Chief Complaint  Patient presents with  . Back Pain   HPI:    Patient is a 76 y.o. Caucasian male who presents for back pain. Onset about 3 weeks ago, no injury/strain/trauma prior.  Hurts constantly while sitting and trying to turn over in bed. Location is diffusely in low back and extends around his sides and across abdomen like a belt. Some periods of intense pain but overall mild/mod.  No radiation into buttocks or legs. Eating and drinking well.  Legs feel a bit weak, comes and goes.  TIngling comes and goes in L leg-long term, chronically/episodically, but "more active lately".   Has chronic urinary incontinence but no fecal incontinence.  No saddle anesthesia.     He has long hx of chronic low back pain, spondylolysis with DDD, hx of lumbar laminectomy in the 1970s. Hx of R lumbosacral radiculopathy with R foot drop in remote past.  Past Medical History:  Diagnosis Date  . AICD (automatic cardioverter/defibrillator) present   . Arthritis    left shoulder (Dr. [name]): steroid injection 04/03/16.  Plan for TSA surgery 06/2016.  [name] BPH (benign prostatic hypertrophy) 06/2011   Irritative sx's; pt declined trial of anticholinergic per Urology records  . CHF (congestive heart failure) ([place])   . Chronic combined systolic and diastolic heart failure ([place]) 05/31/2012   Nonischemic:  EF 40-45%, LA mod-severe dilated, AFIB.   02/2016 EF 40%, diffuse hypokinesis, grade 2 DD.  Myoc perf imaging showed EF 32% 04/2016.  Pt upgraded to CRT-D 01/04/17.  [name] Chronic low back pain   . Chronic renal insufficiency, stage III (moderate) (HCC) 2015   CrCl about 60 ml/min  . Depression   . DOE (dyspnea on exertion)    NYHA class I CHF  . Dyspnea    with exertion  . Episodic low back pain 01/22/2013   w/intermittent radiculitis (12/2014 his neurologist referred him to pain mgmt for epidural steroid injection)  . GERD (gastroesophageal reflux disease)   . H/O  tilt table evaluation 11/02/05   negative  . Helicobacter pylori gastritis 01/2016  . High cholesterol   . History of adenomatous polyp of colon 10/12/11   Dr. [name] (3 right side of colon- tubular adenomas removed)  . History of cardiovascular stress test 05/28/12   no ischemia, EF 37%, imaging results are unchanged and within normal variance  . History of chronic prostatitis   . History of kidney stones   . History of vertigo    + Hx of posterior HA's.  Neuro (Dr. Erling Cruz) eval 2011.  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  . Hypertension   . Lumbar spondylosis    lumbosacral radiculopathy at L4 by EMG testing, right foot drop (neurologist is Dr. Linus Salmons with [facility] in [place])--neurologist referred him to neurosurgery  . Migraine    "used to have them all the time; none for years" (01/04/2017)  . Myocardial infarction [facility]) ?1970s  . Nephrolithiasis 07/2012   Left UVJ 2 mm stone with dilation of renal collecting system and slight hydroureter on right  . Neuropathy   . 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  Since no CAD on cath 05/2016, cards recommends upgrade to CRT-D.  [name] Permanent atrial fibrillation ([place])    DCCV 07/09/13-converted, lasted two days, then back into afib--needs lifetime anticoagulation (Xarelto as of 09/2014)  . Skin cancer  of arm, left    "burned it off" (01/04/2017)  . Type II diabetes mellitus ([place])     Past Surgical History:  Procedure Laterality Date  . BACK SURGERY    . BI-VENTRICULAR IMPLANTABLE CARDIOVERTER DEFIBRILLATOR  (CRT-D) Left 01/04/2017  . BIV ICD INSERTION CRT-D N/A 01/04/2017   Procedure: BiV ICD ;  Surgeon: Constance Haw, MD;  Location: [place] [facility];  Service: Cardiovascular;  Laterality: N/A;  . CARDIAC CATHETERIZATION N/A 06/14/2016   Minimal nonobstructive dz, EF 25-35%.  Procedure:  Left Heart Cath and Coronary Angiography;  Surgeon: Peter M Martinique, MD;  Location: [place] [facility];  Service: Cardiovascular;  Laterality: N/A;  . CARDIOVASCULAR STRESS TEST  2012   2012 nuclear perfusion study: low risk scan; 04/2016 normal myocardial perfusion imaging, EF 32%.  [name] CARDIOVERSION  07/09/2012   Procedure: CARDIOVERSION;  Surgeon: [name], MD;  Location: [place];  Service: Cardiovascular;  Laterality: N/A;  . CATARACT EXTRACTION W/ INTRAOCULAR LENS IMPLANT & ANTERIOR VITRECTOMY, BILATERAL Bilateral   . COLONOSCOPY W/ POLYPECTOMY  approx 2006; repeated 09/2011   Polyps on 2013 EGD as well, repeat 12/2014  . ESOPHAGOGASTRODUODENOSCOPY  10/18/06   Done due to chronic GERD: Normal, bx showed no barrett's esophagus (Dr. [name])  . FLEXOR TENDON REPAIR Left 10/02/2016   Procedure: LEFT RING FINGER WOUND EXPORATION AND FLEXOR TENDON REPAIR AND NERVE REPAIR;  Surgeon: Milly Jakob, MD;  Location: [place];  Service: Orthopedics;  Laterality: Left;  . INSERT / REPLACE / REMOVE PACEMAKER  02/05/2012   dual chamber, sinus node dysfunction, sinus arrest, PAF, Medtronic Revo serial#-[id number] H: last checked 05/2015  . LUMBAR LAMINECTOMY Left 1976   L4-5  . PACEMAKER REMOVAL  01/04/2017  . PERMANENT PACEMAKER INSERTION N/A 02/05/2012   Procedure: PERMANENT PACEMAKER INSERTION;  Surgeon: [name], MD; Generator Medtronic Revo model [place] serial number [id number] H Laterality: N/A;  . RETINAL DETACHMENT SURGERY Left ~ 1999  . REVERSE SHOULDER ARTHROPLASTY Left 2018   Left shoulder reverse TSA [name] [place]  . TRANSTHORACIC ECHOCARDIOGRAM  08/25/10; 05/2012; 03/23/16   mild asymmetric LVH, normal systolic function, normal diastolic fxn, mild-to-mod mitral regurg, mild aortic valve sclerosis and trace AI, mild aortic root dilatation. 2014 f/u showed EF 40-45%, mod LAE, A FIB.  02/2016 EF 40%, diffuse hypokinesis, grade 2 DD.    Outpatient Medications Prior to Visit   Medication Sig Dispense Refill  . acetaminophen (TYLENOL) 325 MG tablet Take 2 tablets (650 mg total) by mouth every 6 (six) hours as needed for mild pain or moderate pain.    [name] colchicine 0.6 MG tablet Take 0.6 mg by mouth daily as needed (for gout).     . COMBIGAN 0.2-0.5 % ophthalmic solution Place 1 drop into both eyes 2 (two) times daily.     [name] ENTRESTO 97-103 MG TAKE 1 TABLET TWICE A DAY 180 tablet 1  . gabapentin (NEURONTIN) 300 MG capsule TAKE 1 CAPSULE THREE TIMES A DAY (Patient taking differently: Take 2 capsules in the AM and 1 capsule at bedtime.) 270 capsule 3  . GLIPIZIDE XL 5 MG 24 hr tablet TAKE 1 TABLET EVERY MORNING 90 tablet 1  . glucose blood test strip Use to check blood sugars 1-2 times daily 100 each 1  . JANUVIA 100 MG tablet TAKE 1 TABLET DAILY 90 tablet 1  . Lancets (PRECISION ULTRA LANCET) MISC Use as instructed 100 each 3  . latanoprost (XALATAN) 0.005 % ophthalmic solution Place 1 drop into both  eyes at bedtime.   12  . meclizine (ANTIVERT) 25 MG tablet Take 25 mg by mouth 3 (three) times daily as needed for dizziness.    . metFORMIN (GLUCOPHAGE) 1000 MG tablet TAKE 1 TABLET TWICE A DAY 180 tablet 1  . omeprazole (PRILOSEC) 40 MG capsule TAKE 1 CAPSULE DAILY 90 capsule 0  . ONE TOUCH LANCETS MISC Use to check blood sugar 1-2 times daily 200 each 0  . rOPINIRole (REQUIP) 1 MG tablet Take 1 tablet (1 mg total) by mouth daily. 90 tablet 3  . sertraline (ZOLOFT) 100 MG tablet TAKE 1 TABLET DAILY 90 tablet 1  . simvastatin (ZOCOR) 20 MG tablet TAKE 1 TABLET EVERY EVENING 90 tablet 1  . TOPROL XL 100 MG 24 hr tablet TAKE ONE AND ONE-HALF TABLETS DAILY 135 tablet 1  . XARELTO 20 MG TABS tablet TAKE 1 TABLET DAILY WITH SUPPER 90 tablet 1   No facility-administered medications prior to visit.     No Known Allergies  ROS As per HPI  PE: Blood pressure (!) 140/93, pulse 73, temperature 98 F (36.7 C), temperature source Oral, resp. rate 16, height 5\' 11"  (1.803 m),  weight 230 lb (104.3 kg), SpO2 98 %. Gen: Alert, well appearing.  Patient is oriented to person, place, time, and situation. AFFECT: pleasant, lucid thought and speech. ROM L/S spine: intact, but slight pain with extension and lateral bending. Palpation: mild mid lumbar facet region TTP, L>R.   No LB, SI, or ischial tuberosity TTP. Sitting SLR neg bilat. LE strength 5/5 prox/dist bilat.   Hips: no tenderness.  Flexion/ext/ER/IR all normal and w/out pain.   LABS:    Chemistry      Component Value Date/Time   NA 139 02/19/2017 1158   NA 139 12/28/2016 1405   K 4.8 02/19/2017 1158   CL 101 02/19/2017 1158   CO2 30 02/19/2017 1158   BUN 15 02/19/2017 1158   BUN 25 12/28/2016 1405   CREATININE 1.20 02/19/2017 1158   CREATININE 1.21 (H) 06/06/2016 1312      Component Value Date/Time   CALCIUM 9.0 02/19/2017 1158   ALKPHOS 78 02/21/2016 1629   AST 17 02/21/2016 1629   ALT 17 02/21/2016 1629   BILITOT 0.7 02/21/2016 1629      CT lumbar myelogram 04/2013: IMPRESSION: 1. Right L3-4 protrusion with cut off of the right L4 nerve root sleeve. 2. 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 3.  Disk bulges L1-2 and L5-S1 without significant mass effect. 4. 4-5 mm retrolisthesis L1-2 with minimal dynamic instability. **I referred him for PT and to a neurosurgeon at that time.  Unfortunately, pt made appt with a neurologist, who recommended more PT and a neurosurg consult, but PT ended up helping pt a lot.  IMPRESSION AND PLAN:  Chronic LBP, episodic in nature last couple years:  Has shown improvement in the past with PT, so we'll refer him to PT again. Also, given worse pain with extension of L spine, will check L/S plain film to f/u pars lesion or worsening L1-2 spondylolisthesis. He has diazepam and vicodin at home to use prn, but admits he doesn't want to take this much. No NSAIDs b/c he's on DOAC for his a-fib and he also has GFR of about 60 ml/min.  An After Visit  Summary was printed and given to the patient.  FOLLOW UP:   Signed:  Crissie Sickles, MD           05/13/2017

## 2017-05-11 NOTE — Telephone Encounter (Signed)
Pt is requesting a letter from Dr. Anitra Lauth saying that he is unable to work due to his medical conditions (needs to be listed). Please advise. Thanks.

## 2017-05-12 ENCOUNTER — Other Ambulatory Visit: Payer: Self-pay | Admitting: Family Medicine

## 2017-05-13 ENCOUNTER — Ambulatory Visit (HOSPITAL_BASED_OUTPATIENT_CLINIC_OR_DEPARTMENT_OTHER)
Admission: RE | Admit: 2017-05-13 | Discharge: 2017-05-13 | Disposition: A | Discharge status: Home / Self Care | Payer: Medicare Other | Source: Ambulatory Visit | Attending: Family Medicine | Admitting: Family Medicine

## 2017-05-13 DIAGNOSIS — M545 Low back pain, unspecified: Secondary | ICD-10-CM

## 2017-05-14 ENCOUNTER — Encounter: Payer: Self-pay | Admitting: Cardiology

## 2017-05-21 ENCOUNTER — Encounter: Payer: Self-pay | Admitting: Cardiovascular Disease

## 2017-05-24 DIAGNOSIS — M545 Low back pain: Secondary | ICD-10-CM | POA: Diagnosis not present

## 2017-05-24 NOTE — Telephone Encounter (Signed)
Informed patient letters from both physicians available. Let letters and OV papers at front desk for pt to pick up tomorrow, per his request. He thanks me for helping with this.

## 2017-05-31 DIAGNOSIS — M545 Low back pain: Secondary | ICD-10-CM | POA: Diagnosis not present

## 2017-06-04 DIAGNOSIS — M545 Low back pain: Secondary | ICD-10-CM | POA: Diagnosis not present

## 2017-06-07 ENCOUNTER — Other Ambulatory Visit: Payer: Self-pay | Admitting: *Deleted

## 2017-06-07 DIAGNOSIS — M545 Low back pain: Secondary | ICD-10-CM | POA: Diagnosis not present

## 2017-06-07 MED ORDER — GLUCOSE BLOOD VI STRP: ORAL_STRIP | 3 refills | Status: DC

## 2017-06-11 ENCOUNTER — Ambulatory Visit (INDEPENDENT_AMBULATORY_CARE_PROVIDER_SITE_OTHER): Payer: Medicare Other | Admitting: Family Medicine

## 2017-06-11 ENCOUNTER — Encounter: Payer: Self-pay | Admitting: Family Medicine

## 2017-06-11 ENCOUNTER — Other Ambulatory Visit: Payer: Self-pay

## 2017-06-11 VITALS — BP 104/69 | HR 78 | Temp 97.9°F | Resp 16 | Ht 71.0 in | Wt 230.5 lb

## 2017-06-11 DIAGNOSIS — G8929 Other chronic pain: Secondary | ICD-10-CM | POA: Diagnosis not present

## 2017-06-11 DIAGNOSIS — Z8739 Personal history of other diseases of the musculoskeletal system and connective tissue: Secondary | ICD-10-CM | POA: Diagnosis not present

## 2017-06-11 DIAGNOSIS — M545 Low back pain, unspecified: Secondary | ICD-10-CM

## 2017-06-11 DIAGNOSIS — Z9889 Other specified postprocedural states: Secondary | ICD-10-CM | POA: Diagnosis not present

## 2017-06-11 MED ORDER — MECLIZINE HCL 25 MG PO TABS: 25.0000 mg | ORAL_TABLET | Freq: Three times a day (TID) | ORAL | 3 refills | Status: DC | PRN
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
## 2017-06-11 NOTE — Patient Instructions (Signed)
Call your neurosurgeon's office and get back with our office if you need another referral to him. Continue physical therapy in the meantime.  Drop off Dr. Griffin Dakin letter and I'll look it over and sign.  Hope you feel better soon.

## 2017-06-19 ENCOUNTER — Encounter: Payer: Self-pay | Admitting: Family Medicine

## 2017-06-19 DIAGNOSIS — M545 Low back pain: Secondary | ICD-10-CM | POA: Diagnosis not present

## 2017-06-22 DIAGNOSIS — M545 Low back pain: Secondary | ICD-10-CM | POA: Diagnosis not present

## 2017-06-29 ENCOUNTER — Ambulatory Visit: Payer: Medicare Other

## 2017-07-03 ENCOUNTER — Ambulatory Visit: Payer: Medicare Other | Admitting: Family Medicine

## 2017-07-06 DIAGNOSIS — M545 Low back pain: Secondary | ICD-10-CM | POA: Diagnosis not present

## 2017-07-09 ENCOUNTER — Ambulatory Visit: Payer: Medicare Other | Admitting: Family Medicine

## 2017-07-09 ENCOUNTER — Encounter: Payer: Self-pay | Admitting: Family Medicine

## 2017-07-09 DIAGNOSIS — M545 Low back pain: Secondary | ICD-10-CM | POA: Diagnosis not present

## 2017-07-09 NOTE — Progress Notes (Deleted)
OFFICE VISIT  07/09/2017   CC: No chief complaint on file.    HPI:    Patient is a 76 y.o. Caucasian male who presents for f/u DM 2, HLD, HTN, chr ren ins stage III (GFR @ 71). He has permanent a-fib, complete heart block, pacemaker, CAD, combined systolic/diastolic CHF with CRT-D that are all followed by cardiology (Dr. Sallyanne Kuster).   Past Medical History:  Diagnosis Date  . AICD (automatic cardioverter/defibrillator) present   . Arthritis    left shoulder (Dr. [name]): steroid injection 04/03/16.  Plan for TSA surgery 06/2016.  [name] BPH (benign prostatic hypertrophy) 06/2011   Irritative sx's; pt declined trial of anticholinergic per Urology records  . CHF (congestive heart failure) ([place])   . Chronic combined systolic and diastolic heart failure ([place]) 05/31/2012   Nonischemic:  EF 40-45%, LA mod-severe dilated, AFIB.   02/2016 EF 40%, diffuse hypokinesis, grade 2 DD.  Myoc perf imaging showed EF 32% 04/2016.  Pt upgraded to CRT-D 01/04/17.  [name] Chronic low back pain   . Chronic renal insufficiency, stage III (moderate) (HCC) 2015   CrCl about 60 ml/min  . Depression   . DOE (dyspnea on exertion)    NYHA class I CHF  . Dyspnea    with exertion  . Episodic low back pain 01/22/2013   w/intermittent radiculitis (12/2014 his neurologist referred him to pain mgmt for epidural steroid injection)  . GERD (gastroesophageal reflux disease)   . H/O tilt table evaluation 11/02/05   negative  . Helicobacter pylori gastritis 01/2016  . High cholesterol   . History of adenomatous polyp of colon 10/12/11   Dr. [name] (3 right side of colon- tubular adenomas removed)  . History of cardiovascular stress test 05/28/12   no ischemia, EF 37%, imaging results are unchanged and within normal variance  . History of chronic prostatitis   . History of kidney stones   . History of vertigo    + Hx of posterior HA's.  Neuro (Dr. Erling Cruz) eval 2011.  Abnormal MRI: bicerebral small vessel dz without brainstem  involvement.  Congenitally small posterior circulation.  . Hypertension   . Lumbar spondylosis    lumbosacral radiculopathy at L4 by EMG testing, right foot drop (neurologist is Dr. Linus Salmons with [facility] in [place])--neurologist referred him to neurosurgery  . Migraine    "used to have them all the time; none for years" (01/04/2017)  . Myocardial infarction [facility]) ?1970s  . Nephrolithiasis 07/2012   Left UVJ 2 mm stone with dilation of renal collecting system and slight hydroureter on right  . Neuropathy   . 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  Since no CAD on cath 05/2016, cards recommends upgrade to CRT-D.  [name] Permanent atrial fibrillation ([place])    DCCV 07/09/13-converted, lasted two days, then back into afib--needs lifetime anticoagulation (Xarelto as of 09/2014)  . Skin cancer of arm, left    "burned it off" (01/04/2017)  . Type II diabetes mellitus ([place])     Past Surgical History:  Procedure Laterality Date  . BACK SURGERY    . BI-VENTRICULAR IMPLANTABLE CARDIOVERTER DEFIBRILLATOR  (CRT-D) Left 01/04/2017  . BIV ICD INSERTION CRT-D N/A 01/04/2017   Procedure: BiV ICD ;  Surgeon: Constance Haw, MD;  Location: [place] [facility];  Service: Cardiovascular;  Laterality: N/A;  . CARDIAC CATHETERIZATION N/A 06/14/2016   Minimal nonobstructive dz, EF 25-35%.  Procedure: Left Heart Cath and Coronary Angiography;  Surgeon:  Peter M Martinique, MD;  Location: [place] [facility];  Service: Cardiovascular;  Laterality: N/A;  . CARDIOVASCULAR STRESS TEST  2012   2012 nuclear perfusion study: low risk scan; 04/2016 normal myocardial perfusion imaging, EF 32%.  [name] CARDIOVERSION  07/09/2012   Procedure: CARDIOVERSION;  Surgeon: [name], MD;  Location: [facility];  Service: Cardiovascular;  Laterality: N/A;  . CATARACT EXTRACTION W/ INTRAOCULAR LENS IMPLANT & ANTERIOR VITRECTOMY, BILATERAL Bilateral   . COLONOSCOPY  W/ POLYPECTOMY  approx 2006; repeated 09/2011   Polyps on 2013 EGD as well, repeat 12/2014  . ESOPHAGOGASTRODUODENOSCOPY  10/18/06   Done due to chronic GERD: Normal, bx showed no barrett's esophagus (Dr. [name])  . FLEXOR TENDON REPAIR Left 10/02/2016   Procedure: LEFT RING FINGER WOUND EXPORATION AND FLEXOR TENDON REPAIR AND NERVE REPAIR;  Surgeon: Milly Jakob, MD;  Location: [facility];  Service: Orthopedics;  Laterality: Left;  . INSERT / REPLACE / REMOVE PACEMAKER  02/05/2012   dual chamber, sinus node dysfunction, sinus arrest, PAF, Medtronic Revo serial#[id number] H: last checked 05/2015  . LUMBAR LAMINECTOMY Left 1976   L4-5  . PACEMAKER REMOVAL  01/04/2017  . PERMANENT PACEMAKER INSERTION N/A 02/05/2012   Procedure: PERMANENT PACEMAKER INSERTION;  Surgeon: [name], MD; Generator Medtronic Revo model [place] serial number [id number] H Laterality: N/A;  . RETINAL DETACHMENT SURGERY Left ~ 1999  . REVERSE SHOULDER ARTHROPLASTY Left 2018   Left shoulder reverse TSA [name] [place]  . TRANSTHORACIC ECHOCARDIOGRAM  08/25/10; 05/2012; 03/23/16   mild asymmetric LVH, normal systolic function, normal diastolic fxn, mild-to-mod mitral regurg, mild aortic valve sclerosis and trace AI, mild aortic root dilatation. 2014 f/u showed EF 40-45%, mod LAE, A FIB.  02/2016 EF 40%, diffuse hypokinesis, grade 2 DD.    Outpatient Medications Prior to Visit  Medication Sig Dispense Refill  . acetaminophen (TYLENOL) 325 MG tablet Take 2 tablets (650 mg total) by mouth every 6 (six) hours as needed for mild pain or moderate pain.    [name] colchicine 0.6 MG tablet Take 0.6 mg by mouth daily as needed (for gout).     . COMBIGAN 0.2-0.5 % ophthalmic solution Place 1 drop into both eyes 2 (two) times daily.     [name] ENTRESTO 97-103 MG TAKE 1 TABLET TWICE A DAY 180 tablet 1  . gabapentin (NEURONTIN) 300 MG capsule TAKE 1 CAPSULE THREE TIMES A DAY (Patient taking differently: Take 2 capsules in the AM and 1 capsule  at bedtime.) 270 capsule 3  . GLIPIZIDE XL 5 MG 24 hr tablet TAKE 1 TABLET EVERY MORNING 90 tablet 1  . glucose blood test strip Use to check blood sugars 1-2 times daily 100 each 3  . JANUVIA 100 MG tablet TAKE 1 TABLET DAILY 90 tablet 1  . latanoprost (XALATAN) 0.005 % ophthalmic solution Place 1 drop into both eyes at bedtime.   12  . meclizine (ANTIVERT) 25 MG tablet Take 1 tablet (25 mg total) 3 (three) times daily as needed by mouth for dizziness. 270 tablet 3  . metFORMIN (GLUCOPHAGE) 1000 MG tablet TAKE 1 TABLET TWICE A DAY 180 tablet 1  . omeprazole (PRILOSEC) 40 MG capsule TAKE 1 CAPSULE DAILY 90 capsule 1  . ONE TOUCH LANCETS MISC Use to check blood sugar 1-2 times daily 200 each 0  . rOPINIRole (REQUIP) 1 MG tablet Take 1 tablet (1 mg total) by mouth daily. 90 tablet 3  . sertraline (ZOLOFT) 100 MG tablet TAKE 1 TABLET DAILY 90 tablet 1  .  simvastatin (ZOCOR) 20 MG tablet TAKE 1 TABLET EVERY EVENING 90 tablet 1  . TOPROL XL 100 MG 24 hr tablet TAKE ONE AND ONE-HALF TABLETS DAILY 135 tablet 1  . XARELTO 20 MG TABS tablet TAKE 1 TABLET DAILY WITH SUPPER 90 tablet 1   No facility-administered medications prior to visit.     No Known Allergies  ROS As per HPI  PE: There were no vitals taken for this visit. ***  LABS:  Lab Results  Component Value Date   TSH 3.37 06/06/2016   Lab Results  Component Value Date   WBC 7.0 02/19/2017   HGB 13.5 02/19/2017   HCT 40.9 02/19/2017   MCV 90.2 02/19/2017   PLT 145.0 (L) 02/19/2017   Lab Results  Component Value Date   CREATININE 1.20 02/19/2017   BUN 15 02/19/2017   NA 139 02/19/2017   K 4.8 02/19/2017   CL 101 02/19/2017   CO2 30 02/19/2017   Lab Results  Component Value Date   ALT 17 02/21/2016   AST 17 02/21/2016   ALKPHOS 78 02/21/2016   BILITOT 0.7 02/21/2016   Lab Results  Component Value Date   CHOL 148 07/03/2016   Lab Results  Component Value Date   HDL 41.40 07/03/2016   Lab Results  Component  Value Date   LDLCALC 72 07/03/2016   Lab Results  Component Value Date   TRIG 172.0 (H) 07/03/2016   Lab Results  Component Value Date   CHOLHDL 4 07/03/2016   Lab Results  Component Value Date   HGBA1C 7.0 03/01/2017    IMPRESSION AND PLAN:  No problem-specific Assessment & Plan notes found for this encounter. A1c, lipid, CMET, CBC.  ?Shingrix? (o/w vaccines UTD). Hx of adenomatous colon polyps (Dr. [name]): recall was 2016.  An After Visit Summary was printed and given to the patient.  FOLLOW UP: No Follow-up on file.  Signed:  Crissie Sickles, MD           07/09/2017

## 2017-07-18 ENCOUNTER — Ambulatory Visit (INDEPENDENT_AMBULATORY_CARE_PROVIDER_SITE_OTHER): Payer: Medicare Other | Admitting: Cardiovascular Disease

## 2017-07-18 ENCOUNTER — Encounter: Payer: Self-pay | Admitting: Cardiovascular Disease

## 2017-07-18 VITALS — BP 122/78 | HR 78 | Ht 71.0 in | Wt 238.8 lb

## 2017-07-18 DIAGNOSIS — Z6833 Body mass index (BMI) 33.0-33.9, adult: Secondary | ICD-10-CM

## 2017-07-18 DIAGNOSIS — E6609 Other obesity due to excess calories: Secondary | ICD-10-CM | POA: Diagnosis not present

## 2017-07-18 DIAGNOSIS — I442 Atrioventricular block, complete: Secondary | ICD-10-CM | POA: Diagnosis not present

## 2017-07-18 DIAGNOSIS — Z950 Presence of cardiac pacemaker: Secondary | ICD-10-CM

## 2017-07-18 DIAGNOSIS — E1169 Type 2 diabetes mellitus with other specified complication: Secondary | ICD-10-CM

## 2017-07-18 DIAGNOSIS — I10 Essential (primary) hypertension: Secondary | ICD-10-CM | POA: Diagnosis not present

## 2017-07-18 DIAGNOSIS — I482 Chronic atrial fibrillation: Secondary | ICD-10-CM

## 2017-07-18 DIAGNOSIS — I5042 Chronic combined systolic (congestive) and diastolic (congestive) heart failure: Secondary | ICD-10-CM

## 2017-07-18 DIAGNOSIS — Z95810 Presence of automatic (implantable) cardiac defibrillator: Secondary | ICD-10-CM | POA: Diagnosis not present

## 2017-07-18 DIAGNOSIS — E669 Obesity, unspecified: Secondary | ICD-10-CM | POA: Diagnosis not present

## 2017-07-18 DIAGNOSIS — I4430 Unspecified atrioventricular block: Secondary | ICD-10-CM | POA: Diagnosis not present

## 2017-07-18 DIAGNOSIS — I4821 Permanent atrial fibrillation: Secondary | ICD-10-CM

## 2017-07-18 DIAGNOSIS — E785 Hyperlipidemia, unspecified: Secondary | ICD-10-CM

## 2017-07-18 NOTE — Progress Notes (Signed)
Cardiology Office Note    Date:  07/19/2017   ID:  VARDAAN DEPASCALE, DOB June 13, 1941, MRN [id number]  PCP:  Tammi Sou, MD  Cardiologist:   [name], MD   Chief Complaint  Patient presents with  . Follow-up    sob, chest pain    History of Present Illness:  MACKY GALIK is a 76 y.o. male with nonischemic cardiomyopathy, permanent atrial fibrillation and complete heart block who has shown gradual decline in left ventricular systolic function due to increased frequency of right ventricular pacing.  He underwent implantation of a CRT-D in June 2018 and has recovered well from that procedure.  He has good functional status and does not require daily loop diuretics to maintain euvolemic status.  The patient specifically denies any chest pain at rest exertion, dyspnea at rest or with exertion, orthopnea, paroxysmal nocturnal dyspnea, syncope, palpitations, focal neurological deficits, intermittent claudication, lower extremity edema, unexplained weight gain, cough, hemoptysis or wheezing.  Interrogation of his biventricular pacemaker defibrillator today shows normal device function.  He has a Psychologist, forensic.  Estimated generator longevity 7.5 years.  All lead parameters look good including a fairly good left ventricular capture threshold at 0.5 V at 1 ms pulse width (LV 3-RV coil configuration).  He has 100% biventricular pacing.  Heart rate histogram distribution is there.  He has no underlying escape rhythm.  Thoracic impedance has been a little volatile but only briefly crossing the fluid overload range and quickly returned to baseline.  He has not had any episodes of ventricular tachycardia or ventricular fibrillation.  Activity is relatively constant at about two-point hours per day.  He does not have coronary or other vascular disorders, but has numerous risk factors including type 2 diabetes, hyperlipidemia, mild obesity. He has a history of  gout.     Past Medical History:  Diagnosis Date  . AICD (automatic cardioverter/defibrillator) present   . Arthritis    left shoulder (Dr. [name]): steroid injection 04/03/16.  Plan for TSA surgery 06/2016.  [name] BPH (benign prostatic hypertrophy) 06/2011   Irritative sx's; pt declined trial of anticholinergic per Urology records  . CHF (congestive heart failure) ([place])   . Chronic combined systolic and diastolic heart failure ([place]) 05/31/2012   Nonischemic:  EF 40-45%, LA mod-severe dilated, AFIB.   02/2016 EF 40%, diffuse hypokinesis, grade 2 DD.  Myoc perf imaging showed EF 32% 04/2016.  Pt upgraded to CRT-D 01/04/17.  [name] Chronic low back pain   . Chronic renal insufficiency, stage III (moderate) (HCC) 2015   CrCl about 60 ml/min  . Depression   . DOE (dyspnea on exertion)    NYHA class I CHF  . Dyspnea    with exertion  . Episodic low back pain 01/22/2013   w/intermittent radiculitis (12/2014 his neurologist referred him to pain mgmt for epidural steroid injection)  . GERD (gastroesophageal reflux disease)   . H/O tilt table evaluation 11/02/05   negative  . Helicobacter pylori gastritis 01/2016  . High cholesterol   . History of adenomatous polyp of colon 10/12/11   Dr. [name] (3 right side of colon- tubular adenomas removed)  . History of cardiovascular stress test 05/28/12   no ischemia, EF 37%, imaging results are unchanged and within normal variance  . History of chronic prostatitis   . History of kidney stones   . History of vertigo    + Hx of posterior HA's.  Neuro (Dr. Erling Cruz) eval 2011.  Abnormal  MRI: bicerebral small vessel dz without brainstem involvement.  Congenitally small posterior circulation.  . Hypertension   . Lumbar spondylosis    lumbosacral radiculopathy at L4 by EMG testing, right foot drop (neurologist is Dr. Linus Salmons with [facility] in [place])--neurologist referred him to neurosurgery  . Migraine    "used to have them all the time; none for  years" (01/04/2017)  . Myocardial infarction [facility]) ?1970s  . Nephrolithiasis 07/2012   Left UVJ 2 mm stone with dilation of renal collecting system and slight hydroureter on right  . Neuropathy   . 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  Since no CAD on cath 05/2016, cards recommends upgrade to CRT-D.  [name] Permanent atrial fibrillation ([place])    DCCV 07/09/13-converted, lasted two days, then back into afib--needs lifetime anticoagulation (Xarelto as of 09/2014)  . Skin cancer of arm, left    "burned it off" (01/04/2017)  . Type II diabetes mellitus ([place])     Past Surgical History:  Procedure Laterality Date  . BACK SURGERY    . BI-VENTRICULAR IMPLANTABLE CARDIOVERTER DEFIBRILLATOR  (CRT-D) Left 01/04/2017  . BIV ICD INSERTION CRT-D N/A 01/04/2017   Procedure: BiV ICD ;  Surgeon: Constance Haw, MD;  Location: [facility];  Service: Cardiovascular;  Laterality: N/A;  . CARDIAC CATHETERIZATION N/A 06/14/2016   Minimal nonobstructive dz, EF 25-35%.  Procedure: Left Heart Cath and Coronary Angiography;  Surgeon: Peter M Martinique, MD;  Location: [facility];  Service: Cardiovascular;  Laterality: N/A;  . CARDIOVASCULAR STRESS TEST  2012   2012 nuclear perfusion study: low risk scan; 04/2016 normal myocardial perfusion imaging, EF 32%.  [name] CARDIOVERSION  07/09/2012   Procedure: CARDIOVERSION;  Surgeon: [name], MD;  Location: [facility];  Service: Cardiovascular;  Laterality: N/A;  . CATARACT EXTRACTION W/ INTRAOCULAR LENS IMPLANT & ANTERIOR VITRECTOMY, BILATERAL Bilateral   . COLONOSCOPY W/ POLYPECTOMY  approx 2006; repeated 09/2011   Polyps on 2013 EGD as well, repeat 12/2014  . ESOPHAGOGASTRODUODENOSCOPY  10/18/06   Done due to chronic GERD: Normal, bx showed no barrett's esophagus (Dr. [name])  . FLEXOR TENDON REPAIR Left 10/02/2016   Procedure: LEFT RING FINGER WOUND EXPORATION AND FLEXOR TENDON REPAIR AND NERVE  REPAIR;  Surgeon: Milly Jakob, MD;  Location: [facility];  Service: Orthopedics;  Laterality: Left;  . INSERT / REPLACE / REMOVE PACEMAKER  02/05/2012   dual chamber, sinus node dysfunction, sinus arrest, PAF, Medtronic Revo serial#[id number] H: last checked 05/2015  . LUMBAR LAMINECTOMY Left 1976   L4-5  . PACEMAKER REMOVAL  01/04/2017  . PERMANENT PACEMAKER INSERTION N/A 02/05/2012   Procedure: PERMANENT PACEMAKER INSERTION;  Surgeon: [name], MD; Generator Medtronic Revo model [place] serial number [id number] H Laterality: N/A;  . RETINAL DETACHMENT SURGERY Left ~ 1999  . REVERSE SHOULDER ARTHROPLASTY Left 2018   Left shoulder reverse TSA [name] [place]  . TRANSTHORACIC ECHOCARDIOGRAM  08/25/10; 05/2012; 03/23/16   mild asymmetric LVH, normal systolic function, normal diastolic fxn, mild-to-mod mitral regurg, mild aortic valve sclerosis and trace AI, mild aortic root dilatation. 2014 f/u showed EF 40-45%, mod LAE, A FIB.  02/2016 EF 40%, diffuse hypokinesis, grade 2 DD.    Current Medications: Outpatient Medications Prior to Visit  Medication Sig Dispense Refill  . acetaminophen (TYLENOL) 325 MG tablet Take 2 tablets (650 mg total) by mouth every 6 (six) hours as needed for mild pain or moderate pain.    [name]  colchicine 0.6 MG tablet Take 0.6 mg by mouth daily as needed (for gout).     . COMBIGAN 0.2-0.5 % ophthalmic solution Place 1 drop into both eyes 2 (two) times daily.     [name] ENTRESTO 97-103 MG TAKE 1 TABLET TWICE A DAY 180 tablet 1  . gabapentin (NEURONTIN) 300 MG capsule TAKE 1 CAPSULE THREE TIMES A DAY (Patient taking differently: Take 2 capsules in the AM and 1 capsule at bedtime.) 270 capsule 3  . GLIPIZIDE XL 5 MG 24 hr tablet TAKE 1 TABLET EVERY MORNING 90 tablet 1  . glucose blood test strip Use to check blood sugars 1-2 times daily 100 each 3  . JANUVIA 100 MG tablet TAKE 1 TABLET DAILY 90 tablet 1  . latanoprost (XALATAN) 0.005 % ophthalmic solution Place 1 drop  into both eyes at bedtime.   12  . meclizine (ANTIVERT) 25 MG tablet Take 1 tablet (25 mg total) 3 (three) times daily as needed by mouth for dizziness. 270 tablet 3  . metFORMIN (GLUCOPHAGE) 1000 MG tablet TAKE 1 TABLET TWICE A DAY 180 tablet 1  . omeprazole (PRILOSEC) 40 MG capsule TAKE 1 CAPSULE DAILY 90 capsule 1  . ONE TOUCH LANCETS MISC Use to check blood sugar 1-2 times daily 200 each 0  . rOPINIRole (REQUIP) 1 MG tablet Take 1 tablet (1 mg total) by mouth daily. 90 tablet 3  . sertraline (ZOLOFT) 100 MG tablet TAKE 1 TABLET DAILY 90 tablet 1  . simvastatin (ZOCOR) 20 MG tablet TAKE 1 TABLET EVERY EVENING 90 tablet 1  . TOPROL XL 100 MG 24 hr tablet TAKE ONE AND ONE-HALF TABLETS DAILY 135 tablet 1  . XARELTO 20 MG TABS tablet TAKE 1 TABLET DAILY WITH SUPPER 90 tablet 1   No facility-administered medications prior to visit.      Allergies:   Patient has no known allergies.   Social History   Socioeconomic History  . Marital status: Married    Spouse name: None  . Number of children: None  . Years of education: None  . Highest education level: None  Social Needs  . Financial resource strain: None  . Food insecurity - worry: None  . Food insecurity - inability: None  . Transportation needs - medical: None  . Transportation needs - non-medical: None  Occupational History  . None  Tobacco Use  . Smoking status: Never Smoker  . Smokeless tobacco: Never Used  Substance and Sexual Activity  . Alcohol use: Yes    Alcohol/week: 5.4 oz    Types: 9 Cans of beer per week  . Drug use: No  . Sexual activity: Yes  Other Topics Concern  . None  Social History Narrative  . None     Family History:  The patient's family history includes Heart failure in his mother; Stroke in his father and mother.   ROS:   Please see the history of present illness.    ROS All other systems reviewed and are negative.   PHYSICAL EXAM:   VS:  BP 122/78   Pulse 78   Ht 5\' 11"  (1.803 m)   Wt  238 lb 12.8 oz (108.3 kg)   BMI 33.31 kg/m     General: Alert, oriented x3, no distress, mildly obese Head: no evidence of trauma, PERRL, EOMI, no exophtalmos or lid lag, no myxedema, no xanthelasma; normal ears, nose and oropharynx Neck: normal jugular venous pulsations and no hepatojugular reflux; brisk carotid pulses without delay and  no carotid bruits Chest: clear to auscultation, no signs of consolidation by percussion or palpation, normal fremitus, symmetrical and full respiratory excursions.  Well-healed left subclavian defibrillator site Cardiovascular: normal position and quality of the apical impulse, regular rhythm, normal first and second heart sounds, no murmurs, rubs or gallops Abdomen: no tenderness or distention, no masses by palpation, no abnormal pulsatility or arterial bruits, normal bowel sounds, no hepatosplenomegaly Extremities: no clubbing, cyanosis or edema; 2+ radial, ulnar and brachial pulses bilaterally; 2+ right femoral, posterior tibial and dorsalis pedis pulses; 2+ left femoral, posterior tibial and dorsalis pedis pulses; no subclavian or femoral bruits Neurological: grossly nonfocal Psych: Normal mood and affect   Wt Readings from Last 3 Encounters:  07/18/17 238 lb 12.8 oz (108.3 kg)  06/11/17 230 lb 8 oz (104.6 kg)  05/11/17 230 lb (104.3 kg)      Studies/Labs Reviewed:   EKG:  EKG is not ordered today.    Recent Labs: 02/19/2017: BUN 15; Creatinine, Ser 1.20; Hemoglobin 13.5; Platelets 145.0; Potassium 4.8; Sodium 139   Lipid Panel    Component Value Date/Time   CHOL 148 07/03/2016 0832   TRIG 172.0 (H) 07/03/2016 0832   HDL 41.40 07/03/2016 0832   CHOLHDL 4 07/03/2016 0832   VLDL 34.4 07/03/2016 0832   LDLCALC 72 07/03/2016 0832     ASSESSMENT:    1. Chronic combined systolic and diastolic congestive heart failure ([place])   2. Complete heart block (HCC)   3. Permanent atrial fibrillation ([place])   4. Biventricular ICD (implantable  cardioverter-defibrillator) in place   5. Dyslipidemia   6. Class 1 obesity due to excess calories with serious comorbidity and body mass index (BMI) of 33.0 to 33.9 in adult   7. Diabetes mellitus type 2 in obese ([place])   8. Essential hypertension      PLAN:  In order of problems listed above:  1. CHF: Appears euvolemic, NYHA functional class I. He is on relatively high doses of beta blocker and Entresto.  Currently not requiring loop diuretics to maintain euvolemic status.  He has gained a little bit of true weight.  Reminded him about the importance of sodium restriction to avoid heart failure exacerbation and daily weight monitoring to detect hypervolemia. 2. CHB: He does not have any underlying escape rhythm today 3. AFib: Permanent.  Compliant with anticoagulation without any bleeding complications or embolic events. 4. CRT-D: Normal device function.  No reprogramming necessary.  Remote downloads every 3 months. 5. DM/obesity/dyslipidemia: He does not have any serious vascular complications but is clearly at risk for developing these over time.  He is pretty active from a physical point of view and weight reduction can only be achieved with caloric restriction. 6. HTN: Excellent control   Medication Adjustments/Labs and Tests Ordered: Current medicines are reviewed at length with the patient today.  Concerns regarding medicines are outlined above.  Medication changes, Labs and Tests ordered today are listed in the Patient Instructions below. Patient Instructions  Dr Sallyanne Kuster recommends that you continue on your current medications as directed. Please refer to the Current Medication list given to you today.  Your physician recommends that you return for lab work in 6 months.  Remote monitoring is used to monitor your Pacemaker of ICD from home. This monitoring reduces the number of office visits required to check your device to one time per year. It allows [place] to keep an eye on the  functioning of your device to ensure it is working properly. You  are scheduled for a device check from home on Thursday, December 27th, 2018. You may send your transmission at any time that day. If you have a wireless device, the transmission will be sent automatically. After your physician reviews your transmission, you will receive a postcard with your next transmission date.  Dr Sallyanne Kuster recommends that you schedule a follow-up appointment in 6 months with an ICD check. You will receive a reminder letter in the mail two months in advance. If you don't receive a letter, please call our office to schedule the follow-up appointment.  If you need a refill on your cardiac medications before your next appointment, please call your pharmacy.    Signed, [name], MD  07/19/2017 5:51 PM    [address], [place], [place]  [address] Phone: [phone]; Fax: [phone]

## 2017-07-18 NOTE — Patient Instructions (Signed)
Dr Sallyanne Kuster recommends that you continue on your current medications as directed. Please refer to the Current Medication list given to you today.  Your physician recommends that you return for lab work in 6 months.  Remote monitoring is used to monitor your Pacemaker of ICD from home. This monitoring reduces the number of office visits required to check your device to one time per year. It allows [place] to keep an eye on the functioning of your device to ensure it is working properly. You are scheduled for a device check from home on Thursday, December 27th, 2018. You may send your transmission at any time that day. If you have a wireless device, the transmission will be sent automatically. After your physician reviews your transmission, you will receive a postcard with your next transmission date.  Dr Sallyanne Kuster recommends that you schedule a follow-up appointment in 6 months with an ICD check. You will receive a reminder letter in the mail two months in advance. If you don't receive a letter, please call our office to schedule the follow-up appointment.  If you need a refill on your cardiac medications before your next appointment, please call your pharmacy.

## 2017-07-19 ENCOUNTER — Ambulatory Visit (INDEPENDENT_AMBULATORY_CARE_PROVIDER_SITE_OTHER): Payer: Medicare Other | Admitting: *Deleted

## 2017-07-19 ENCOUNTER — Telehealth: Payer: Self-pay | Admitting: Cardiology

## 2017-07-19 DIAGNOSIS — I5042 Chronic combined systolic (congestive) and diastolic (congestive) heart failure: Secondary | ICD-10-CM

## 2017-07-19 DIAGNOSIS — M545 Low back pain: Secondary | ICD-10-CM | POA: Diagnosis not present

## 2017-07-19 NOTE — Telephone Encounter (Signed)
Confirmed remote transmission w/ pt wife.   

## 2017-07-24 DIAGNOSIS — N529 Male erectile dysfunction, unspecified: Secondary | ICD-10-CM

## 2017-07-24 HISTORY — DX: Male erectile dysfunction, unspecified: N52.9

## 2017-07-25 ENCOUNTER — Ambulatory Visit: Payer: Medicare Other | Admitting: Family Medicine

## 2017-07-26 NOTE — Progress Notes (Signed)
Remote ICD transmission.   

## 2017-07-27 ENCOUNTER — Ambulatory Visit: Payer: Medicare Other

## 2017-07-27 ENCOUNTER — Other Ambulatory Visit: Payer: Self-pay

## 2017-07-27 ENCOUNTER — Encounter: Payer: Self-pay | Admitting: Family Medicine

## 2017-07-27 ENCOUNTER — Encounter: Payer: Self-pay | Admitting: Cardiology

## 2017-07-27 ENCOUNTER — Ambulatory Visit (INDEPENDENT_AMBULATORY_CARE_PROVIDER_SITE_OTHER): Payer: Medicare Other | Admitting: Family Medicine

## 2017-07-27 VITALS — BP 130/82 | HR 90 | Resp 18 | Ht 71.0 in | Wt 233.8 lb

## 2017-07-27 DIAGNOSIS — E7800 Pure hypercholesterolemia, unspecified: Secondary | ICD-10-CM

## 2017-07-27 DIAGNOSIS — Z0000 Encounter for general adult medical examination without abnormal findings: Secondary | ICD-10-CM

## 2017-07-27 DIAGNOSIS — I10 Essential (primary) hypertension: Secondary | ICD-10-CM | POA: Diagnosis not present

## 2017-07-27 DIAGNOSIS — E119 Type 2 diabetes mellitus without complications: Secondary | ICD-10-CM | POA: Diagnosis not present

## 2017-07-27 DIAGNOSIS — M545 Low back pain: Secondary | ICD-10-CM | POA: Diagnosis not present

## 2017-07-27 LAB — COMPREHENSIVE METABOLIC PANEL
ALT: 14 U/L (ref 0–53)
AST: 18 U/L (ref 0–37)
Albumin: 4.3 g/dL (ref 3.5–5.2)
Alkaline Phosphatase: 78 U/L (ref 39–117)
BUN: 18 mg/dL (ref 6–23)
CO2: 31 mEq/L (ref 19–32)
Calcium: 9.2 mg/dL (ref 8.4–10.5)
Chloride: 101 mEq/L (ref 96–112)
Creatinine, Ser: 1.3 mg/dL (ref 0.40–1.50)
GFR: 57.2 mL/min — ABNORMAL LOW (ref >60.00)
Glucose, Bld: 149 mg/dL — ABNORMAL HIGH (ref 70–99)
Potassium: 4.8 mEq/L (ref 3.5–5.1)
Sodium: 139 mEq/L (ref 135–145)
Total Bilirubin: 0.5 mg/dL (ref 0.2–1.2)
Total Protein: 7 g/dL (ref 6.0–8.3)

## 2017-07-27 LAB — LIPID PANEL
Cholesterol: 134 mg/dL (ref 0–200)
HDL: 37.5 mg/dL — ABNORMAL LOW (ref >39.00)
LDL Cholesterol: 68 mg/dL (ref 0–99)
NonHDL: 96.62
Total CHOL/HDL Ratio: 4
Triglycerides: 144 mg/dL (ref 0.0–149.0)
VLDL: 28.8 mg/dL (ref 0.0–40.0)

## 2017-07-27 LAB — HEMOGLOBIN A1C: Hgb A1c MFr Bld: 8.2 % — ABNORMAL HIGH (ref 4.6–6.5)

## 2017-07-27 NOTE — Progress Notes (Signed)
Subjective:   Mason [name] is a 77 y.o. male who presents for Medicare Annual/Subsequent preventive examination.  Review of Systems:  No ROS.  Medicare Wellness Visit. Additional risk factors are reflected in the social history.  Cardiac Risk Factors include: advanced age (>34men, >76 women);diabetes mellitus;dyslipidemia;hypertension;male gender;obesity (BMI >30kg/m2);sedentary lifestyle;family history of premature cardiovascular disease   Sleep patterns: Sleeps with difficulty. Up to void several times.  Home Safety/Smoke Alarms: Feels safe in home. Smoke alarms in place.  Living environment; residence and Firearm Safety: Lives with wife in 1 story home.  Seat Belt Safety/Bike Helmet: Wears seat belt.   Male:   CCS-Colonoscopy 2013, polyps. Recall 5 years. Pt will schedule.  PSA- Followed by Urology. 09/08/2016 0.640     Objective:    Vitals: BP 130/82 (BP Location: Left Arm, Patient Position: Sitting, Cuff Size: Normal)   Pulse 90   Resp 18   Ht 5\' 11"  (1.803 m)   Wt 233 lb 12.8 oz (106.1 kg)   SpO2 97%   BMI 32.61 kg/m   Body mass index is 32.61 kg/m.  Advanced Directives 07/27/2017 01/04/2017 09/24/2016 07/31/2016 06/26/2016 06/14/2016 08/20/2015  Does Patient Have a Medical Advance Directive? No No No Yes No No No  Type of Advance Directive - - Public librarian;Living will - - -  Would patient like information on creating a medical advance directive? No - Patient declined No - Patient declined - - Yes (MAU/Ambulatory/Procedural Areas - Information given) Yes (MAU/Ambulatory/Procedural Areas - Information given) -  Pre-existing out of facility DNR order (yellow form or pink MOST form) - - - - - - -    Tobacco Social History   Tobacco Use  Smoking Status Never Smoker  Smokeless Tobacco Never Used     Counseling given: Not Answered     Past Medical History:  Diagnosis Date  . AICD (automatic cardioverter/defibrillator) present   . Arthritis    left  shoulder (Dr. [name]): steroid injection 04/03/16.  Plan for TSA surgery 06/2016.  [name] BPH (benign prostatic hypertrophy) 06/2011   Irritative sx's; pt declined trial of anticholinergic per Urology records  . CHF (congestive heart failure) ([place])   . Chronic combined systolic and diastolic heart failure ([place]) 05/31/2012   Nonischemic:  EF 40-45%, LA mod-severe dilated, AFIB.   02/2016 EF 40%, diffuse hypokinesis, grade 2 DD.  Myoc perf imaging showed EF 32% 04/2016.  Pt upgraded to CRT-D 01/04/17.  [name] Chronic low back pain   . Chronic renal insufficiency, stage III (moderate) ([facility]) 2015   CrCl about 60 ml/min  . Depression   . DOE (dyspnea on exertion)    NYHA class I CHF  . Dyspnea    with exertion  . Episodic low back pain 01/22/2013   w/intermittent radiculitis (12/2014 his neurologist referred him to pain mgmt for epidural steroid injection)  . GERD (gastroesophageal reflux disease)   . H/O tilt table evaluation 11/02/05   negative  . Helicobacter pylori gastritis 01/2016  . High cholesterol   . History of adenomatous polyp of colon 10/12/11   Dr. [name] (3 right side of colon- tubular adenomas removed)  . History of cardiovascular stress test 05/28/12   no ischemia, EF 37%, imaging results are unchanged and within normal variance  . History of chronic prostatitis   . History of kidney stones   . History of vertigo    + Hx of posterior HA's.  Neuro (Dr. Erling Cruz) eval 2011.  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  Congenitally small posterior circulation.  . Hypertension   . Lumbar spondylosis    lumbosacral radiculopathy at L4 by EMG testing, right foot drop (neurologist is Dr. Linus Salmons with [facility] in [place])--neurologist referred him to neurosurgery  . Migraine    "used to have them all the time; none for years" (01/04/2017)  . Myocardial infarction [facility]) ?1970s  . Nephrolithiasis 07/2012   Left UVJ 2 mm stone with dilation of renal  collecting system and slight hydroureter on right  . Neuropathy   . 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  Since no CAD on cath 05/2016, cards recommends upgrade to CRT-D.  [name] Permanent atrial fibrillation ([place])    DCCV 07/09/13-converted, lasted two days, then back into afib--needs lifetime anticoagulation (Xarelto as of 09/2014)  . Skin cancer of arm, left    "burned it off" (01/04/2017)  . Type II diabetes mellitus ([place])    Past Surgical History:  Procedure Laterality Date  . BACK SURGERY    . BI-VENTRICULAR IMPLANTABLE CARDIOVERTER DEFIBRILLATOR  (CRT-D) Left 01/04/2017  . BIV ICD INSERTION CRT-D N/A 01/04/2017   Procedure: BiV ICD ;  Surgeon: Constance Haw, MD;  Location: [place];  Service: Cardiovascular;  Laterality: N/A;  . CARDIAC CATHETERIZATION N/A 06/14/2016   Minimal nonobstructive dz, EF 25-35%.  Procedure: Left Heart Cath and Coronary Angiography;  Surgeon: Peter M Martinique, MD;  Location: [facility];  Service: Cardiovascular;  Laterality: N/A;  . CARDIOVASCULAR STRESS TEST  2012   2012 nuclear perfusion study: low risk scan; 04/2016 normal myocardial perfusion imaging, EF 32%.  [name] CARDIOVERSION  07/09/2012   Procedure: CARDIOVERSION;  Surgeon: [name], MD;  Location: [place];  Service: Cardiovascular;  Laterality: N/A;  . CATARACT EXTRACTION W/ INTRAOCULAR LENS IMPLANT & ANTERIOR VITRECTOMY, BILATERAL Bilateral   . COLONOSCOPY W/ POLYPECTOMY  approx 2006; repeated 09/2011   Polyps on 2013 EGD as well, repeat 12/2014  . ESOPHAGOGASTRODUODENOSCOPY  10/18/06   Done due to chronic GERD: Normal, bx showed no barrett's esophagus (Dr. [name])  . FLEXOR TENDON REPAIR Left 10/02/2016   Procedure: LEFT RING FINGER WOUND EXPORATION AND FLEXOR TENDON REPAIR AND NERVE REPAIR;  Surgeon: Milly Jakob, MD;  Location: [place];  Service: Orthopedics;  Laterality: Left;  . INSERT / REPLACE / REMOVE  PACEMAKER  02/05/2012   dual chamber, sinus node dysfunction, sinus arrest, PAF, Medtronic Revo serial#-[id number] H: last checked 05/2015  . LUMBAR LAMINECTOMY Left 1976   L4-5  . PACEMAKER REMOVAL  01/04/2017  . PERMANENT PACEMAKER INSERTION N/A 02/05/2012   Procedure: PERMANENT PACEMAKER INSERTION;  Surgeon: [name], MD; Generator Medtronic Revo model [place] serial number [id number] H Laterality: N/A;  . RETINAL DETACHMENT SURGERY Left ~ 1999  . REVERSE SHOULDER ARTHROPLASTY Left 2018   Left shoulder reverse TSA [name] in [place]).  . TRANSTHORACIC ECHOCARDIOGRAM  08/25/10; 05/2012; 03/23/16   mild asymmetric LVH, normal systolic function, normal diastolic fxn, mild-to-mod mitral regurg, mild aortic valve sclerosis and trace AI, mild aortic root dilatation. 2014 f/u showed EF 40-45%, mod LAE, A FIB.  02/2016 EF 40%, diffuse hypokinesis, grade 2 DD.   Family History  Problem Relation Age of Onset  . Heart failure Mother   . Stroke Mother   . Stroke Father    Social History   Socioeconomic History  . Marital status: Married    Spouse name: None  . Number of children:  None  . Years of education: None  . Highest education level: None  Social Needs  . Financial resource strain: None  . Food insecurity - worry: None  . Food insecurity - inability: None  . Transportation needs - medical: None  . Transportation needs - non-medical: None  Occupational History  . None  Tobacco Use  . Smoking status: Never Smoker  . Smokeless tobacco: Never Used  Substance and Sexual Activity  . Alcohol use: Yes    Comment: daily  . Drug use: No  . Sexual activity: Yes  Other Topics Concern  . None  Social History Narrative  . None    Outpatient Encounter Medications as of 07/27/2017  Medication Sig  . acetaminophen (TYLENOL) 325 MG tablet Take 2 tablets (650 mg total) by mouth every 6 (six) hours as needed for mild pain or moderate pain.  [name] colchicine 0.6 MG tablet Take 0.6 mg by  mouth daily as needed (for gout).   . COMBIGAN 0.2-0.5 % ophthalmic solution Place 1 drop into both eyes 2 (two) times daily.   [name] ENTRESTO 97-103 MG TAKE 1 TABLET TWICE A DAY  . gabapentin (NEURONTIN) 300 MG capsule TAKE 1 CAPSULE THREE TIMES A DAY (Patient taking differently: Take 2 capsules in the AM and 1 capsule at bedtime.)  . GLIPIZIDE XL 5 MG 24 hr tablet TAKE 1 TABLET EVERY MORNING  . glucose blood test strip Use to check blood sugars 1-2 times daily  . JANUVIA 100 MG tablet TAKE 1 TABLET DAILY  . latanoprost (XALATAN) 0.005 % ophthalmic solution Place 1 drop into both eyes at bedtime.   . meclizine (ANTIVERT) 25 MG tablet Take 1 tablet (25 mg total) 3 (three) times daily as needed by mouth for dizziness.  . metFORMIN (GLUCOPHAGE) 1000 MG tablet TAKE 1 TABLET TWICE A DAY  . omeprazole (PRILOSEC) 40 MG capsule TAKE 1 CAPSULE DAILY  . ONE TOUCH LANCETS MISC Use to check blood sugar 1-2 times daily  . rOPINIRole (REQUIP) 1 MG tablet Take 1 tablet (1 mg total) by mouth daily.  . sertraline (ZOLOFT) 100 MG tablet TAKE 1 TABLET DAILY  . simvastatin (ZOCOR) 20 MG tablet TAKE 1 TABLET EVERY EVENING  . TOPROL XL 100 MG 24 hr tablet TAKE ONE AND ONE-HALF TABLETS DAILY  . XARELTO 20 MG TABS tablet TAKE 1 TABLET DAILY WITH SUPPER   No facility-administered encounter medications on file as of 07/27/2017.     Activities of Daily Living In your present state of health, do you have any difficulty performing the following activities: 07/27/2017 01/04/2017  Hearing? N -  Vision? N -  Difficulty concentrating or making decisions? N -  Walking or climbing stairs? N -  Dressing or bathing? N -  Doing errands, shopping? N N  Preparing Food and eating ? N -  Using the Toilet? N -  In the past six months, have you accidently leaked urine? N -  Do you have problems with loss of bowel control? N -  Managing your Medications? N -  Managing your Finances? N -  Housekeeping or managing your Housekeeping? N  -  Some recent data might be hidden    Patient Care Team: Tammi Sou, MD as PCP - General (Family Medicine) Carol Ada, MD as Consulting Physician (Gastroenterology) Kathie Rhodes, MD as Consulting Physician (Urology) Troy Sine, MD as Consulting Physician (Cardiology) Audery Amel, MD as Referring Physician (Neurology) Clent Jacks, MD as Consulting Physician (Ophthalmology) Croitoru, Verdel,  MD as Consulting Physician (Cardiology) Audery Amel, MD as Consulting Physician (Neurology) [name] Gaylene Brooks, DO as Consulting Physician (Orthopedic Surgery) Aleda Grana (Dentistry) Constance Haw, MD as Consulting Physician (Cardiology)   Assessment:   This is a routine wellness examination for K[place].  Exercise Activities and Dietary recommendations Current Exercise Habits: The patient does not participate in regular exercise at present(yard work), Exercise limited by: respiratory conditions(s);cardiac condition(s)   Diet (meal preparation, eat out, water intake, caffeinated beverages, dairy products, fruits and vegetables): Drinks coffee, beer and 1 glass of water.   Breakfast: Oatmeal, cereal, eggs Lunch: skips Dinner: pasta, protein and vegetables.    Goals    . Weight (lb) < 210 lb (95.3 kg)     Lose weight by eating less.       Fall Risk Fall Risk  07/27/2017 06/26/2016 04/26/2016 04/22/2015  Falls in the past year? Yes Yes No No  Number falls in past yr: 1 2 or more - -  Injury with Fall? No No - -  Follow up Falls prevention discussed Falls prevention discussed - -    Depression Screen PHQ 2/9 Scores 07/27/2017 06/26/2016 04/26/2016 04/22/2015  PHQ - 2 Score 3 0 1 0  PHQ- 9 Score 13 - - -    Cognitive Function       Ad8 score reviewed for issues:  Issues making decisions: no  Less interest in hobbies / activities: no  Repeats questions, stories (family complaining): no  Trouble using ordinary gadgets (microwave, computer,  phone): no  Forgets the month or year: no  Mismanaging finances: no  Remembering appts: no  Daily problems with thinking and/or memory: no Ad8 score is=0     Immunization History  Administered Date(s) Administered  . Influenza, High Dose Seasonal PF 05/26/2013, 05/20/2015, 06/26/2016, 05/11/2017  . Influenza,inj,Quad PF,6+ Mos 06/15/2014  . Pneumococcal Conjugate-13 10/15/2014  . Pneumococcal Polysaccharide-23 07/24/2010  . Tdap 08/30/2011     Screening Tests Health Maintenance  Topic Date Due  . HEMOGLOBIN A1C  09/01/2017  . FOOT EXAM  11/16/2017  . URINE MICROALBUMIN  11/16/2017  . OPHTHALMOLOGY EXAM  03/27/2018  . TETANUS/TDAP  08/29/2021  . COLONOSCOPY  10/11/2021  . INFLUENZA VACCINE  Completed  . PNA vac Low Risk Adult  Completed      Plan:    Schedule colonoscopy.   Continue doing brain stimulating activities (puzzles, reading, adult coloring books, staying active) to keep memory sharp.   I have personally reviewed and noted the following in the patient's chart:   . Medical and social history . Use of alcohol, tobacco or illicit drugs  . Current medications and supplements . Functional ability and status . Nutritional status . Physical activity . Advanced directives . List of other physicians . Hospitalizations, surgeries, and ER visits in previous 12 months . Vitals . Screenings to include cognitive, depression, and falls . Referrals and appointments  In addition, I have reviewed and discussed with patient certain preventive protocols, quality metrics, and best practice recommendations. A written personalized care plan for preventive services as well as general preventive health recommendations were provided to patient.     Gerilyn Nestle, RN  07/27/2017

## 2017-07-27 NOTE — Progress Notes (Signed)
AWV reviewed and agree.  Signed:  Crissie Sickles, MD           07/27/2017

## 2017-07-27 NOTE — Patient Instructions (Signed)
Schedule colonoscopy   Continue doing brain stimulating activities (puzzles, reading, adult coloring books, staying active) to keep memory sharp.    Health Maintenance, Male A healthy lifestyle and preventive care is important for your health and wellness. Ask your health care provider about what schedule of regular examinations is right for you. What should I know about weight and diet? Eat a Healthy Diet  Eat plenty of vegetables, fruits, whole grains, low-fat dairy products, and lean protein.  Do not eat a lot of foods high in solid fats, added sugars, or salt.  Maintain a Healthy Weight Regular exercise can help you achieve or maintain a healthy weight. You should:  Do at least 150 minutes of exercise each week. The exercise should increase your heart rate and make you sweat (moderate-intensity exercise).  Do strength-training exercises at least twice a week.  Watch Your Levels of Cholesterol and Blood Lipids  Have your blood tested for lipids and cholesterol every 5 years starting at 75 years of age. If you are at high risk for heart disease, you should start having your blood tested when you are 75 years old. You may need to have your cholesterol levels checked more often if: ? Your lipid or cholesterol levels are high. ? You are older than 75 years of age. ? You are at high risk for heart disease.  What should I know about cancer screening? Many types of cancers can be detected early and may often be prevented. Lung Cancer  You should be screened every year for lung cancer if: ? You are a current smoker who has smoked for at least 30 years. ? You are a former smoker who has quit within the past 15 years.  Talk to your health care provider about your screening options, when you should start screening, and how often you should be screened.  Colorectal Cancer  Routine colorectal cancer screening usually begins at 75 years of age and should be repeated every 5-10 years  until you are 75 years old. You may need to be screened more often if early forms of precancerous polyps or small growths are found. Your health care provider may recommend screening at an earlier age if you have risk factors for colon cancer.  Your health care provider may recommend using home test kits to check for hidden blood in the stool.  A small camera at the end of a tube can be used to examine your colon (sigmoidoscopy or colonoscopy). This checks for the earliest forms of colorectal cancer.  Prostate and Testicular Cancer  Depending on your age and overall health, your health care provider may do certain tests to screen for prostate and testicular cancer.  Talk to your health care provider about any symptoms or concerns you have about testicular or prostate cancer.  Skin Cancer  Check your skin from head to toe regularly.  Tell your health care provider about any new moles or changes in moles, especially if: ? There is a change in a mole's size, shape, or color. ? You have a mole that is larger than a pencil eraser.  Always use sunscreen. Apply sunscreen liberally and repeat throughout the day.  Protect yourself by wearing long sleeves, pants, a wide-brimmed hat, and sunglasses when outside.  What should I know about heart disease, diabetes, and high blood pressure?  If you are 18-39 years of age, have your blood pressure checked every 3-5 years. If you are 40 years of age or older, have your blood   pressure checked every year. You should have your blood pressure measured twice-once when you are at a hospital or clinic, and once when you are not at a hospital or clinic. Record the average of the two measurements. To check your blood pressure when you are not at a hospital or clinic, you can use: ? An automated blood pressure machine at a pharmacy. ? A home blood pressure monitor.  Talk to your health care provider about your target blood pressure.  If you are between 45-79  years old, ask your health care provider if you should take aspirin to prevent heart disease.  Have regular diabetes screenings by checking your fasting blood sugar level. ? If you are at a normal weight and have a low risk for diabetes, have this test once every three years after the age of 45. ? If you are overweight and have a high risk for diabetes, consider being tested at a younger age or more often.  A one-time screening for abdominal aortic aneurysm (AAA) by ultrasound is recommended for men aged 65-75 years who are current or former smokers. What should I know about preventing infection? Hepatitis B If you have a higher risk for hepatitis B, you should be screened for this virus. Talk with your health care provider to find out if you are at risk for hepatitis B infection. Hepatitis C Blood testing is recommended for:  Everyone born from 1945 through 1965.  Anyone with known risk factors for hepatitis C.  Sexually Transmitted Diseases (STDs)  You should be screened each year for STDs including gonorrhea and chlamydia if: ? You are sexually active and are younger than 75 years of age. ? You are older than 75 years of age and your health care provider tells you that you are at risk for this type of infection. ? Your sexual activity has changed since you were last screened and you are at an increased risk for chlamydia or gonorrhea. Ask your health care provider if you are at risk.  Talk with your health care provider about whether you are at high risk of being infected with HIV. Your health care provider may recommend a prescription medicine to help prevent HIV infection.  What else can I do?  Schedule regular health, dental, and eye exams.  Stay current with your vaccines (immunizations).  Do not use any tobacco products, such as cigarettes, chewing tobacco, and e-cigarettes. If you need help quitting, ask your health care provider.  Limit alcohol intake to no more than 2  drinks per day. One drink equals 12 ounces of beer, 5 ounces of wine, or 1 ounces of hard liquor.  Do not use street drugs.  Do not share needles.  Ask your health care provider for help if you need support or information about quitting drugs.  Tell your health care provider if you often feel depressed.  Tell your health care provider if you have ever been abused or do not feel safe at home. This information is not intended to replace advice given to you by your health care provider. Make sure you discuss any questions you have with your health care provider. Document Released: 01/06/2008 Document Revised: 03/08/2016 Document Reviewed: 04/13/2015 Elsevier Interactive Patient Education  2018 [employer]  

## 2017-07-27 NOTE — Progress Notes (Signed)
OFFICE VISIT  07/27/2017   CC:  Chief Complaint  Patient presents with  . Medicare Wellness  Fasting HPI:    Patient is a 77 y.o. Caucasian male who presents for f/u recent acute-on-chronic bilat LBP w/out radiculopathy, also routine f/u DM 2.  Says he went back to PT and got dry-needling done and pt says his back feels much better. Not hurting anywhere at this time.  DM 2: "runs anywhere from 136 to 180s".  Compliant with glipizide, januvia, and metformin. Not eating diabetic diet.    HLD: tolerating/compliant with simvastatin.  Not exercising any but he tries not to be sedentary.  Past Medical History:  Diagnosis Date  . AICD (automatic cardioverter/defibrillator) present   . Arthritis    left shoulder (Dr. [name]): steroid injection 04/03/16.  Plan for TSA surgery 06/2016.  [name] BPH (benign prostatic hypertrophy) 06/2011   Irritative sx's; pt declined trial of anticholinergic per Urology records  . CHF (congestive heart failure) ([place])   . Chronic combined systolic and diastolic heart failure ([place]) 05/31/2012   Nonischemic:  EF 40-45%, LA mod-severe dilated, AFIB.   02/2016 EF 40%, diffuse hypokinesis, grade 2 DD.  Myoc perf imaging showed EF 32% 04/2016.  Pt upgraded to CRT-D 01/04/17.  [name] Chronic low back pain   . Chronic renal insufficiency, stage III (moderate) (HCC) 2015   CrCl about 60 ml/min  . Depression   . DOE (dyspnea on exertion)    NYHA class I CHF  . Dyspnea    with exertion  . Episodic low back pain 01/22/2013   w/intermittent radiculitis (12/2014 his neurologist referred him to pain mgmt for epidural steroid injection)  . GERD (gastroesophageal reflux disease)   . H/O tilt table evaluation 11/02/05   negative  . Helicobacter pylori gastritis 01/2016  . High cholesterol   . History of adenomatous polyp of colon 10/12/11   Dr. [name] (3 right side of colon- tubular adenomas removed)  . History of cardiovascular stress test 05/28/12   no ischemia, EF 37%,  imaging results are unchanged and within normal variance  . History of chronic prostatitis   . History of kidney stones   . History of vertigo    + Hx of posterior HA's.  Neuro (Dr. Erling Cruz) eval 2011.  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  . Hypertension   . Lumbar spondylosis    lumbosacral radiculopathy at L4 by EMG testing, right foot drop (neurologist is Dr. Linus Salmons with [facility] in [place])--neurologist referred him to neurosurgery  . Migraine    "used to have them all the time; none for years" (01/04/2017)  . Myocardial infarction [facility]) ?1970s  . Nephrolithiasis 07/2012   Left UVJ 2 mm stone with dilation of renal collecting system and slight hydroureter on right  . Neuropathy   . 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  Since no CAD on cath 05/2016, cards recommends upgrade to CRT-D.  [name] Permanent atrial fibrillation ([place])    DCCV 07/09/13-converted, lasted two days, then back into afib--needs lifetime anticoagulation (Xarelto as of 09/2014)  . Skin cancer of arm, left    "burned it off" (01/04/2017)  . Type II diabetes mellitus ([place])     Past Surgical History:  Procedure Laterality Date  . BACK SURGERY    . BI-VENTRICULAR IMPLANTABLE CARDIOVERTER DEFIBRILLATOR  (CRT-D) Left 01/04/2017  . BIV ICD INSERTION CRT-D N/A 01/04/2017   Procedure:  BiV ICD ;  Surgeon: Constance Haw, MD;  Location: [place] CV [facility];  Service: Cardiovascular;  Laterality: N/A;  . CARDIAC CATHETERIZATION N/A 06/14/2016   Minimal nonobstructive dz, EF 25-35%.  Procedure: Left Heart Cath and Coronary Angiography;  Surgeon: Peter M Martinique, MD;  Location: [place] CV [facility];  Service: Cardiovascular;  Laterality: N/A;  . CARDIOVASCULAR STRESS TEST  2012   2012 nuclear perfusion study: low risk scan; 04/2016 normal myocardial perfusion imaging, EF 32%.  [name]  CARDIOVERSION  07/09/2012   Procedure: CARDIOVERSION;  Surgeon: [name], MD;  Location: [place];  Service: Cardiovascular;  Laterality: N/A;  . CATARACT EXTRACTION W/ INTRAOCULAR LENS IMPLANT & ANTERIOR VITRECTOMY, BILATERAL Bilateral   . COLONOSCOPY W/ POLYPECTOMY  approx 2006; repeated 09/2011   Polyps on 2013 EGD as well, repeat 12/2014  . ESOPHAGOGASTRODUODENOSCOPY  10/18/06   Done due to chronic GERD: Normal, bx showed no barrett's esophagus (Dr. [name])  . FLEXOR TENDON REPAIR Left 10/02/2016   Procedure: LEFT RING FINGER WOUND EXPORATION AND FLEXOR TENDON REPAIR AND NERVE REPAIR;  Surgeon: Milly Jakob, MD;  Location: [place];  Service: Orthopedics;  Laterality: Left;  . INSERT / REPLACE / REMOVE PACEMAKER  02/05/2012   dual chamber, sinus node dysfunction, sinus arrest, PAF, Medtronic Revo serial#-[id number] H: last checked 05/2015  . LUMBAR LAMINECTOMY Left 1976   L4-5  . PACEMAKER REMOVAL  01/04/2017  . PERMANENT PACEMAKER INSERTION N/A 02/05/2012   Procedure: PERMANENT PACEMAKER INSERTION;  Surgeon: [name], MD; Generator Medtronic Revo model [place] serial number [id number] H Laterality: N/A;  . RETINAL DETACHMENT SURGERY Left ~ 1999  . REVERSE SHOULDER ARTHROPLASTY Left 2018   Left shoulder reverse TSA [name] [place]  . TRANSTHORACIC ECHOCARDIOGRAM  08/25/10; 05/2012; 03/23/16   mild asymmetric LVH, normal systolic function, normal diastolic fxn, mild-to-mod mitral regurg, mild aortic valve sclerosis and trace AI, mild aortic root dilatation. 2014 f/u showed EF 40-45%, mod LAE, A FIB.  02/2016 EF 40%, diffuse hypokinesis, grade 2 DD.    Outpatient Medications Prior to Visit  Medication Sig Dispense Refill  . acetaminophen (TYLENOL) 325 MG tablet Take 2 tablets (650 mg total) by mouth every 6 (six) hours as needed for mild pain or moderate pain.    [name] colchicine 0.6 MG tablet Take 0.6 mg by mouth daily as needed (for gout).     . COMBIGAN 0.2-0.5 % ophthalmic  solution Place 1 drop into both eyes 2 (two) times daily.     [name] ENTRESTO 97-103 MG TAKE 1 TABLET TWICE A DAY 180 tablet 1  . gabapentin (NEURONTIN) 300 MG capsule TAKE 1 CAPSULE THREE TIMES A DAY (Patient taking differently: Take 2 capsules in the AM and 1 capsule at bedtime.) 270 capsule 3  . GLIPIZIDE XL 5 MG 24 hr tablet TAKE 1 TABLET EVERY MORNING 90 tablet 1  . glucose blood test strip Use to check blood sugars 1-2 times daily 100 each 3  . JANUVIA 100 MG tablet TAKE 1 TABLET DAILY 90 tablet 1  . latanoprost (XALATAN) 0.005 % ophthalmic solution Place 1 drop into both eyes at bedtime.   12  . meclizine (ANTIVERT) 25 MG tablet Take 1 tablet (25 mg total) 3 (three) times daily as needed by mouth for dizziness. 270 tablet 3  . metFORMIN (GLUCOPHAGE) 1000 MG tablet TAKE 1 TABLET TWICE A DAY 180 tablet 1  . omeprazole (PRILOSEC) 40 MG capsule TAKE 1 CAPSULE DAILY 90 capsule 1  . ONE TOUCH LANCETS MISC  Use to check blood sugar 1-2 times daily 200 each 0  . rOPINIRole (REQUIP) 1 MG tablet Take 1 tablet (1 mg total) by mouth daily. 90 tablet 3  . sertraline (ZOLOFT) 100 MG tablet TAKE 1 TABLET DAILY 90 tablet 1  . simvastatin (ZOCOR) 20 MG tablet TAKE 1 TABLET EVERY EVENING 90 tablet 1  . TOPROL XL 100 MG 24 hr tablet TAKE ONE AND ONE-HALF TABLETS DAILY 135 tablet 1  . XARELTO 20 MG TABS tablet TAKE 1 TABLET DAILY WITH SUPPER 90 tablet 1   No facility-administered medications prior to visit.     No Known Allergies  ROS As per HPI  PE: Blood pressure 130/82, pulse 90, resp. rate 18, height 5\' 11"  (1.803 m), weight 233 lb 12.8 oz (106.1 kg), SpO2 97 %. Gen: Alert, well appearing.  Patient is oriented to person, place, time, and situation. AFFECT: pleasant, lucid thought and speech. CV: RRR, no m/r/g.   LUNGS: CTA bilat, nonlabored resps, good aeration in all lung fields. EXT: no clubbing or cyanosis.  No edema in R LL, 1+ edema in L LL.   LABS:  Lab Results  Component Value Date   TSH  3.37 06/06/2016   Lab Results  Component Value Date   WBC 7.0 02/19/2017   HGB 13.5 02/19/2017   HCT 40.9 02/19/2017   MCV 90.2 02/19/2017   PLT 145.0 (L) 02/19/2017   Lab Results  Component Value Date   CREATININE 1.20 02/19/2017   BUN 15 02/19/2017   NA 139 02/19/2017   K 4.8 02/19/2017   CL 101 02/19/2017   CO2 30 02/19/2017   Lab Results  Component Value Date   ALT 17 02/21/2016   AST 17 02/21/2016   ALKPHOS 78 02/21/2016   BILITOT 0.7 02/21/2016  GFR 63 ml/min  Lab Results  Component Value Date   CHOL 148 07/03/2016   Lab Results  Component Value Date   HDL 41.40 07/03/2016   Lab Results  Component Value Date   LDLCALC 72 07/03/2016   Lab Results  Component Value Date   TRIG 172.0 (H) 07/03/2016   Lab Results  Component Value Date   CHOLHDL 4 07/03/2016   Lab Results  Component Value Date   HGBA1C 7.0 03/01/2017    IMPRESSION AND PLAN:  1) DM; sounds like control not so good of late. HbA1c today. He is UTD on vaccines and on routine diabetic monitoring. Continue current meds at this time.  2) Hypercholesterolemia: tolerating statin.  Check FLP and hepatic panel today.  3) Acute-on-chronic low back pain: resolved with PT.  An After Visit Summary was printed and given to the patient.  FOLLOW UP: Return in about 3 months (around 10/25/2017) for routine chronic illness f/u.  Signed:  Crissie Sickles, MD           07/27/2017

## 2017-07-31 LAB — CUP PACEART REMOTE DEVICE CHECK
Battery Remaining Longevity: 91 mo
Battery Voltage: 2.99 V
Brady Statistic AP VP Percent: 0 %
Brady Statistic AP VS Percent: 0 %
Brady Statistic AS VP Percent: 0 %
Brady Statistic AS VS Percent: 0 %
Brady Statistic RA Percent Paced: 0 %
Brady Statistic RV Percent Paced: 99.96 %
Date Time Interrogation Session: 20181228213738
HighPow Impedance: 68 Ohm
Implantable Lead Implant Date: 20130715
Implantable Lead Implant Date: 20180614
Implantable Lead Implant Date: 20180614
Implantable Lead Location: 753858
Implantable Lead Location: 753859
Implantable Lead Location: 753860
Implantable Lead Model: 4598
Implantable Pulse Generator Implant Date: 20180614
Lead Channel Impedance Value: 228 Ohm
Lead Channel Impedance Value: 228 Ohm
Lead Channel Impedance Value: 228 Ohm
Lead Channel Impedance Value: 228 Ohm
Lead Channel Impedance Value: 228 Ohm
Lead Channel Impedance Value: 399 Ohm
Lead Channel Impedance Value: 456 Ohm
Lead Channel Impedance Value: 456 Ohm
Lead Channel Impedance Value: 456 Ohm
Lead Channel Impedance Value: 456 Ohm
Lead Channel Impedance Value: 513 Ohm
Lead Channel Impedance Value: 532 Ohm
Lead Channel Impedance Value: 551 Ohm
Lead Channel Impedance Value: 760 Ohm
Lead Channel Impedance Value: 779 Ohm
Lead Channel Impedance Value: 779 Ohm
Lead Channel Impedance Value: 779 Ohm
Lead Channel Impedance Value: 779 Ohm
Lead Channel Pacing Threshold Amplitude: 0.5 V
Lead Channel Pacing Threshold Amplitude: 0.75 V
Lead Channel Pacing Threshold Pulse Width: 0.4 ms
Lead Channel Pacing Threshold Pulse Width: 1 ms
Lead Channel Sensing Intrinsic Amplitude: 0.625 mV
Lead Channel Setting Pacing Amplitude: 1 V
Lead Channel Setting Pacing Amplitude: 2 V
Lead Channel Setting Pacing Pulse Width: 0.4 ms
Lead Channel Setting Pacing Pulse Width: 1 ms
Lead Channel Setting Sensing Sensitivity: 0.3 mV

## 2017-08-23 DIAGNOSIS — S61215D Laceration without foreign body of left ring finger without damage to nail, subsequent encounter: Secondary | ICD-10-CM | POA: Diagnosis not present

## 2017-08-24 DIAGNOSIS — S93401A Sprain of unspecified ligament of right ankle, initial encounter: Secondary | ICD-10-CM

## 2017-08-24 HISTORY — DX: Sprain of unspecified ligament of right ankle, initial encounter: S93.401A

## 2017-09-02 ENCOUNTER — Emergency Department (HOSPITAL_BASED_OUTPATIENT_CLINIC_OR_DEPARTMENT_OTHER)
Admission: EM | Admit: 2017-09-02 | Discharge: 2017-09-02 | Disposition: A | Discharge status: Home / Self Care | Payer: Medicare Other | Attending: Emergency Medicine | Admitting: Emergency Medicine

## 2017-09-02 ENCOUNTER — Other Ambulatory Visit: Payer: Self-pay

## 2017-09-02 ENCOUNTER — Encounter (HOSPITAL_BASED_OUTPATIENT_CLINIC_OR_DEPARTMENT_OTHER): Payer: Self-pay | Admitting: *Deleted

## 2017-09-02 ENCOUNTER — Emergency Department (HOSPITAL_BASED_OUTPATIENT_CLINIC_OR_DEPARTMENT_OTHER): Payer: Medicare Other

## 2017-09-02 DIAGNOSIS — X500XXA Overexertion from strenuous movement or load, initial encounter: Secondary | ICD-10-CM | POA: Diagnosis not present

## 2017-09-02 DIAGNOSIS — S99911A Unspecified injury of right ankle, initial encounter: Secondary | ICD-10-CM | POA: Diagnosis not present

## 2017-09-02 DIAGNOSIS — Z950 Presence of cardiac pacemaker: Secondary | ICD-10-CM | POA: Insufficient documentation

## 2017-09-02 DIAGNOSIS — S93401A Sprain of unspecified ligament of right ankle, initial encounter: Secondary | ICD-10-CM | POA: Insufficient documentation

## 2017-09-02 DIAGNOSIS — Y929 Unspecified place or not applicable: Secondary | ICD-10-CM | POA: Insufficient documentation

## 2017-09-02 DIAGNOSIS — Z79899 Other long term (current) drug therapy: Secondary | ICD-10-CM | POA: Diagnosis not present

## 2017-09-02 DIAGNOSIS — E1122 Type 2 diabetes mellitus with diabetic chronic kidney disease: Secondary | ICD-10-CM | POA: Insufficient documentation

## 2017-09-02 DIAGNOSIS — Z7984 Long term (current) use of oral hypoglycemic drugs: Secondary | ICD-10-CM | POA: Diagnosis not present

## 2017-09-02 DIAGNOSIS — S99921A Unspecified injury of right foot, initial encounter: Secondary | ICD-10-CM | POA: Diagnosis not present

## 2017-09-02 DIAGNOSIS — Z85828 Personal history of other malignant neoplasm of skin: Secondary | ICD-10-CM | POA: Insufficient documentation

## 2017-09-02 DIAGNOSIS — Y999 Unspecified external cause status: Secondary | ICD-10-CM | POA: Diagnosis not present

## 2017-09-02 DIAGNOSIS — Y9301 Activity, walking, marching and hiking: Secondary | ICD-10-CM | POA: Insufficient documentation

## 2017-09-02 DIAGNOSIS — M79671 Pain in right foot: Secondary | ICD-10-CM | POA: Diagnosis not present

## 2017-09-02 DIAGNOSIS — I130 Hypertensive heart and chronic kidney disease with heart failure and stage 1 through stage 4 chronic kidney disease, or unspecified chronic kidney disease: Secondary | ICD-10-CM | POA: Insufficient documentation

## 2017-09-02 DIAGNOSIS — N182 Chronic kidney disease, stage 2 (mild): Secondary | ICD-10-CM | POA: Insufficient documentation

## 2017-09-02 DIAGNOSIS — I509 Heart failure, unspecified: Secondary | ICD-10-CM | POA: Insufficient documentation

## 2017-09-02 NOTE — ED Triage Notes (Signed)
Pt c/o right foot and ankle injury x 3 days

## 2017-09-02 NOTE — ED Notes (Signed)
Pt states he stepped in a hole on Friday and twisted his right ankle. Presents with swelling and bruing to same. Moves toes. Feels touch. Cap refill < 3 sec. Ice applied.

## 2017-09-02 NOTE — Discharge Instructions (Signed)
Please read instructions below. Apply ice to your ankle for 20 minutes at a time. Keep it elevated as much as possible. You can take tylenol or advil every 6 hours as needed for pain. Schedule an appointment with the orthopedic specialist in 1 week for repeat x-ray and follow-up on your injury. Return to the ER for new or concerning symptoms.

## 2017-09-02 NOTE — ED Provider Notes (Signed)
[facility] EMERGENCY DEPARTMENT Provider Note   CSN: [id number] Arrival date & time: 09/02/17  1249     History   Chief Complaint Chief Complaint  Patient presents with  . Ankle Injury    HPI Mason [name] is a 77 y.o. male to the ED with right ankle injury that occurred on Friday.  Patient states he stepped in a hole and twisted his ankle.  He states he has pain in the medial and lateral aspect of the ankle, worse with ambulation.  Reports associated swelling.  Has not taken any medications at home for pain. No previous fractures to ankle. Denies fall, head trauma or LOC.  The history is provided by the patient.    Past Medical History:  Diagnosis Date  . AICD (automatic cardioverter/defibrillator) present   . Arthritis    left shoulder (Dr. [name]): steroid injection 04/03/16.  Plan for TSA surgery 06/2016.  [name] BPH (benign prostatic hypertrophy) 06/2011   Irritative sx's; pt declined trial of anticholinergic per Urology records  . CHF (congestive heart failure) ([place])   . Chronic combined systolic and diastolic heart failure ([place]) 05/31/2012   Nonischemic:  EF 40-45%, LA mod-severe dilated, AFIB.   02/2016 EF 40%, diffuse hypokinesis, grade 2 DD.  Myoc perf imaging showed EF 32% 04/2016.  Pt upgraded to CRT-D 01/04/17.  [name] Chronic low back pain   . Chronic renal insufficiency, stage III (moderate) (HCC) 2015   CrCl about 60 ml/min  . Complete heart block (HCC)    Has dual chamber pacer.  . Depression   . DOE (dyspnea on exertion)    NYHA class I CHF  . Dyspnea    with exertion  . Episodic low back pain 01/22/2013   w/intermittent radiculitis (12/2014 his neurologist referred him to pain mgmt for epidural steroid injection)  . GERD (gastroesophageal reflux disease)   . H/O tilt table evaluation 11/02/05   negative  . Helicobacter pylori gastritis 01/2016  . High cholesterol   . History of adenomatous polyp of colon 10/12/11   Dr. [name] (3 right side of colon-  tubular adenomas removed)  . History of cardiovascular stress test 05/28/12   no ischemia, EF 37%, imaging results are unchanged and within normal variance  . History of chronic prostatitis   . History of kidney stones   . History of vertigo    + Hx of posterior HA's.  Neuro (Dr. Erling Cruz) eval 2011.  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  . Hypertension   . Lumbar spondylosis    lumbosacral radiculopathy at L4 by EMG testing, right foot drop (neurologist is Dr. Linus Salmons with [facility] in [place])--neurologist referred him to neurosurgery  . Migraine    "used to have them all the time; none for years" (01/04/2017)  . Myocardial infarction [facility]) ?1970s  . Nephrolithiasis 07/2012   Left UVJ 2 mm stone with dilation of renal collecting system and slight hydroureter on right  . Neuropathy   . 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  Since no CAD on cath 05/2016, cards recommends upgrade to CRT-D.  [name] Permanent atrial fibrillation ([place])    DCCV 07/09/13-converted, lasted two days, then back into afib--needs lifetime anticoagulation (Xarelto as of 09/2014)  . Skin cancer of arm, left    "burned it off" (01/04/2017)  . Type II diabetes mellitus [facility])     Patient Active Problem List  Diagnosis Date Noted  . CHF (congestive heart failure) ([place]) 01/04/2017  . Chronic systolic heart failure ([place]) 08/04/2016  . Chronic renal insufficiency, stage II (mild) 12/09/2015  . Uncontrolled hypertension 10/11/2015  . Permanent atrial fibrillation ([place]) 06/22/2015  . Diabetes mellitus with complication ([place]) 81/07/7508  . Cellulitis of finger of left hand 11/20/2014  . Swollen R ankle 05/25/2014  . Leg fatigue 02/15/2014  . Lumbar back pain with radiculopathy affecting left lower extremity 02/15/2014  . Other malaise and fatigue 02/05/2014  . Cervical muscle  strain 11/27/2013  . Osteoarthritis of right knee 10/14/2013  . HTN (hypertension), benign 05/26/2013  . Acute low back pain with radicular symptoms, duration less than 6 weeks 05/12/2013  . Hyperlipidemia 04/21/2013  . DM2 (diabetes mellitus, type 2) ([place]) 04/21/2013  . Atrial fibrillation, permanent 03/08/2013  . Left ventricular dysfunction 05/31/2012  . Near syncope 02/06/2012  . AVB (atrioventricular block) 02/06/2012  . Cardiac pacemaker 02/05/12 02/06/2012  . Dyslipidemia 02/06/2012    Past Surgical History:  Procedure Laterality Date  . BACK SURGERY    . BI-VENTRICULAR IMPLANTABLE CARDIOVERTER DEFIBRILLATOR  (CRT-D) Left 01/04/2017  . BIV ICD INSERTION CRT-D N/A 01/04/2017   Procedure: BiV ICD ;  Surgeon: Constance Haw, MD;  Location: [place] CV LAB;  Service: Cardiovascular;  Laterality: N/A;  . CARDIAC CATHETERIZATION N/A 06/14/2016   Minimal nonobstructive dz, EF 25-35%.  Procedure: Left Heart Cath and Coronary Angiography;  Surgeon: Peter M Martinique, MD;  Location: [place] CV LAB;  Service: Cardiovascular;  Laterality: N/A;  . CARDIOVASCULAR STRESS TEST  2012   2012 nuclear perfusion study: low risk scan; 04/2016 normal myocardial perfusion imaging, EF 32%.  [name] CARDIOVERSION  07/09/2012   Procedure: CARDIOVERSION;  Surgeon: [name], MD;  Location: [place];  Service: Cardiovascular;  Laterality: N/A;  . CATARACT EXTRACTION W/ INTRAOCULAR LENS IMPLANT & ANTERIOR VITRECTOMY, BILATERAL Bilateral   . COLONOSCOPY W/ POLYPECTOMY  approx 2006; repeated 09/2011   Polyps on 2013 EGD as well, repeat 12/2014  . ESOPHAGOGASTRODUODENOSCOPY  10/18/06   Done due to chronic GERD: Normal, bx showed no barrett's esophagus (Dr. [name])  . FLEXOR TENDON REPAIR Left 10/02/2016   Procedure: LEFT RING FINGER WOUND EXPORATION AND FLEXOR TENDON REPAIR AND NERVE REPAIR;  Surgeon: Milly Jakob, MD;  Location: [place];  Service: Orthopedics;  Laterality: Left;  . INSERT / REPLACE /  REMOVE PACEMAKER  02/05/2012   dual chamber, sinus node dysfunction, sinus arrest, PAF, Medtronic Revo serial#-[id number] H: last checked 05/2015  . LUMBAR LAMINECTOMY Left 1976   L4-5  . PACEMAKER REMOVAL  01/04/2017  . PERMANENT PACEMAKER INSERTION N/A 02/05/2012   Procedure: PERMANENT PACEMAKER INSERTION;  Surgeon: [name], MD; Generator Medtronic Revo model [place] serial number [id number] H Laterality: N/A;  . RETINAL DETACHMENT SURGERY Left ~ 1999  . REVERSE SHOULDER ARTHROPLASTY Left 2018   Left shoulder reverse TSA [name] [place]  . TRANSTHORACIC ECHOCARDIOGRAM  08/25/10; 05/2012; 03/23/16   mild asymmetric LVH, normal systolic function, normal diastolic fxn, mild-to-mod mitral regurg, mild aortic valve sclerosis and trace AI, mild aortic root dilatation. 2014 f/u showed EF 40-45%, mod LAE, A FIB.  02/2016 EF 40%, diffuse hypokinesis, grade 2 DD.       Home Medications    Prior to Admission medications   Medication Sig Start Date End Date Taking? Authorizing Provider  acetaminophen (TYLENOL) 325 MG tablet Take 2 tablets (650 mg total) by mouth every 6 (six) hours as needed for mild pain or  moderate pain. 10/02/16   Milly Jakob, MD  colchicine 0.6 MG tablet Take 0.6 mg by mouth daily as needed (for gout).  08/20/15   [provider]  COMBIGAN 0.2-0.5 % ophthalmic solution Place 1 drop into both eyes 2 (two) times daily.  04/19/16   [provider]  ENTRESTO 97-103 MG TAKE 1 TABLET TWICE A DAY 03/19/17   Croitoru, Mihai, MD  gabapentin (NEURONTIN) 300 MG capsule TAKE 1 CAPSULE THREE TIMES A DAY Patient taking differently: Take 2 capsules in the AM and 1 capsule at bedtime. 11/13/16   McGowen, Adrian Blackwater, MD  GLIPIZIDE XL 5 MG 24 hr tablet TAKE 1 TABLET EVERY MORNING 04/25/17   McGowen, Adrian Blackwater, MD  glucose blood test strip Use to check blood sugars 1-2 times daily 06/07/17   McGowen, Adrian Blackwater, MD  JANUVIA 100 MG tablet TAKE 1 TABLET DAILY 04/25/17    McGowen, Adrian Blackwater, MD  latanoprost (XALATAN) 0.005 % ophthalmic solution Place 1 drop into both eyes at bedtime.  04/24/15   [provider]  meclizine (ANTIVERT) 25 MG tablet Take 1 tablet (25 mg total) 3 (three) times daily as needed by mouth for dizziness. 06/11/17   Tammi Sou, MD  metFORMIN (GLUCOPHAGE) 1000 MG tablet TAKE 1 TABLET TWICE A DAY 04/25/17   McGowen, Adrian Blackwater, MD  omeprazole (PRILOSEC) 40 MG capsule TAKE 1 CAPSULE DAILY 05/14/17   McGowen, Adrian Blackwater, MD  ONE TOUCH LANCETS MISC Use to check blood sugar 1-2 times daily 06/28/16   McGowen, Adrian Blackwater, MD  rOPINIRole (REQUIP) 1 MG tablet Take 1 tablet (1 mg total) by mouth daily. 04/02/17   McGowen, Adrian Blackwater, MD  sertraline (ZOLOFT) 100 MG tablet TAKE 1 TABLET DAILY 03/01/17   McGowen, Adrian Blackwater, MD  simvastatin (ZOCOR) 20 MG tablet TAKE 1 TABLET EVERY EVENING 04/25/17   McGowen, Adrian Blackwater, MD  TOPROL XL 100 MG 24 hr tablet TAKE ONE AND ONE-HALF TABLETS DAILY 03/08/17   McGowen, Adrian Blackwater, MD  XARELTO 20 MG TABS tablet TAKE 1 TABLET DAILY WITH SUPPER 04/24/17   Croitoru, Mihai, MD    Family History Family History  Problem Relation Age of Onset  . Heart failure Mother   . Stroke Mother   . Stroke Father     Social History Social History   Tobacco Use  . Smoking status: Never Smoker  . Smokeless tobacco: Never Used  Substance Use Topics  . Alcohol use: Yes    Comment: daily  . Drug use: No     Allergies   Patient has no known allergies.   Review of Systems Review of Systems  Musculoskeletal: Positive for arthralgias and joint swelling.  Neurological: Negative for numbness.     Physical Exam Updated Vital Signs BP 135/80 (BP Location: Right Arm)   Pulse 69   Temp 98.5 F (36.9 C)   Resp 18   Ht 5\' 11"  (1.803 m)   Wt 103.9 kg (229 lb)   SpO2 99%   BMI 31.94 kg/m   Physical Exam  Constitutional: He appears well-developed and well-nourished. No distress.  HENT:  Head: Normocephalic and atraumatic.   Eyes: Conjunctivae are normal.  Cardiovascular: Normal rate and intact distal pulses.  Pulmonary/Chest: Effort normal.  Musculoskeletal:  Right ankle and foot with swelling.  TTP over medial and lateral aspect of ankle joint.  No tenderness over foot, Achilles, or lower leg.  Patient able to range ankle, however limited secondary to swelling and pain.  No wounds.  No ecchymosis.  No gross deformities.  Ankle is stable.  Psychiatric: He has a normal mood and affect. His behavior is normal.  Nursing note and vitals reviewed.    ED Treatments / Results  Labs (all labs ordered are listed, but only abnormal results are displayed) Labs Reviewed - No data to display  EKG  EKG Interpretation None       Radiology Dg Ankle Complete Right  Result Date: 09/02/2017 CLINICAL DATA:  Stepped in a hole 3 days ago and injured right ankle. EXAM: RIGHT ANKLE - COMPLETE 3+ VIEW COMPARISON:  None. FINDINGS: The ankle mortise is maintained. Suspect a subtle avulsion fracture involving the distal tip of the lateral malleolus. No fractures of the tibia or talus are identified. Calcaneal spurring changes are noted. Mild midfoot degenerative changes. IMPRESSION: Suspect subtle distal fibular avulsion fracture. Electronically Signed   By: Marijo Sanes M.D.   On: 09/02/2017 14:08   Dg Foot Complete Right  Result Date: 09/02/2017 CLINICAL DATA:  Injured right foot 3 days ago.  Persistent pain. EXAM: RIGHT FOOT COMPLETE - 3+ VIEW COMPARISON:  None. FINDINGS: Moderate midfoot degenerative changes but no acute fractures are identified. Calcaneal spurring changes are noted. Suspect small ankle joint effusion. IMPRESSION: No acute foot fractures.  Midfoot degenerative changes. Electronically Signed   By: Marijo Sanes M.D.   On: 09/02/2017 14:09    Procedures Procedures (including critical care time)  Medications Ordered in ED Medications - No data to display   Initial Impression / Assessment and Plan / ED  Course  I have reviewed the triage vital signs and the nursing notes.  Pertinent labs & imaging results that were available during my care of the patient were reviewed by me and considered in my medical decision making (see chart for details).     Pt presenting with right ankle injury that occurred on Friday. Xray revealing subtle distal fibular avulsion fracture, suspect sprain. Cam boot applied in ED. Pt advised to follow up with orthopedics in 1 week. Conservative therapy recommended and discussed. Patient will be dc home & is agreeable with above plan.  Patient discussed with Dr. Ralene Bathe.  Discussed results, findings, treatment and follow up. Patient advised of return precautions. Patient verbalized understanding and agreed with plan.  Final Clinical Impressions(s) / ED Diagnoses   Final diagnoses:  Sprain of right ankle, unspecified ligament, initial encounter    ED Discharge Orders    None       George Haggart, [place] N, PA-C 09/02/17 1849    Quintella Reichert, MD 09/03/17 1458

## 2017-09-03 ENCOUNTER — Telehealth: Payer: Self-pay

## 2017-09-03 NOTE — Telephone Encounter (Signed)
Copied from [place]. Topic: Quick Communication - Lab Results >> Sep 03, 2017 10:28 AM Mason [name] wrote: Would like to know when and the results of last A1C    Spoke to patient via phone call. HgbA1c was given to patient and date lab drawn via last lab note.  Patient scheduled 3 month follow up appt.

## 2017-09-06 ENCOUNTER — Emergency Department (HOSPITAL_BASED_OUTPATIENT_CLINIC_OR_DEPARTMENT_OTHER)
Admission: EM | Admit: 2017-09-06 | Discharge: 2017-09-06 | Disposition: A | Discharge status: Home / Self Care | Payer: Medicare Other | Attending: Physician Assistant | Admitting: Physician Assistant

## 2017-09-06 ENCOUNTER — Emergency Department (HOSPITAL_BASED_OUTPATIENT_CLINIC_OR_DEPARTMENT_OTHER): Payer: Medicare Other

## 2017-09-06 ENCOUNTER — Encounter (HOSPITAL_BASED_OUTPATIENT_CLINIC_OR_DEPARTMENT_OTHER): Payer: Self-pay

## 2017-09-06 ENCOUNTER — Other Ambulatory Visit: Payer: Self-pay

## 2017-09-06 DIAGNOSIS — M25571 Pain in right ankle and joints of right foot: Secondary | ICD-10-CM | POA: Diagnosis not present

## 2017-09-06 DIAGNOSIS — Y9389 Activity, other specified: Secondary | ICD-10-CM | POA: Diagnosis not present

## 2017-09-06 DIAGNOSIS — N183 Chronic kidney disease, stage 3 (moderate): Secondary | ICD-10-CM | POA: Diagnosis not present

## 2017-09-06 DIAGNOSIS — Y929 Unspecified place or not applicable: Secondary | ICD-10-CM | POA: Insufficient documentation

## 2017-09-06 DIAGNOSIS — M7989 Other specified soft tissue disorders: Secondary | ICD-10-CM

## 2017-09-06 DIAGNOSIS — I130 Hypertensive heart and chronic kidney disease with heart failure and stage 1 through stage 4 chronic kidney disease, or unspecified chronic kidney disease: Secondary | ICD-10-CM | POA: Diagnosis not present

## 2017-09-06 DIAGNOSIS — Z7984 Long term (current) use of oral hypoglycemic drugs: Secondary | ICD-10-CM | POA: Insufficient documentation

## 2017-09-06 DIAGNOSIS — Z79899 Other long term (current) drug therapy: Secondary | ICD-10-CM | POA: Insufficient documentation

## 2017-09-06 DIAGNOSIS — R2241 Localized swelling, mass and lump, right lower limb: Secondary | ICD-10-CM | POA: Diagnosis not present

## 2017-09-06 DIAGNOSIS — Y998 Other external cause status: Secondary | ICD-10-CM | POA: Insufficient documentation

## 2017-09-06 DIAGNOSIS — I252 Old myocardial infarction: Secondary | ICD-10-CM | POA: Insufficient documentation

## 2017-09-06 DIAGNOSIS — S99911A Unspecified injury of right ankle, initial encounter: Secondary | ICD-10-CM | POA: Diagnosis present

## 2017-09-06 DIAGNOSIS — E1140 Type 2 diabetes mellitus with diabetic neuropathy, unspecified: Secondary | ICD-10-CM | POA: Insufficient documentation

## 2017-09-06 DIAGNOSIS — I5042 Chronic combined systolic (congestive) and diastolic (congestive) heart failure: Secondary | ICD-10-CM | POA: Insufficient documentation

## 2017-09-06 DIAGNOSIS — E1122 Type 2 diabetes mellitus with diabetic chronic kidney disease: Secondary | ICD-10-CM | POA: Diagnosis not present

## 2017-09-06 DIAGNOSIS — Z7901 Long term (current) use of anticoagulants: Secondary | ICD-10-CM | POA: Insufficient documentation

## 2017-09-06 DIAGNOSIS — M79661 Pain in right lower leg: Secondary | ICD-10-CM | POA: Diagnosis not present

## 2017-09-06 DIAGNOSIS — S93401A Sprain of unspecified ligament of right ankle, initial encounter: Secondary | ICD-10-CM | POA: Diagnosis not present

## 2017-09-06 DIAGNOSIS — Z950 Presence of cardiac pacemaker: Secondary | ICD-10-CM | POA: Diagnosis not present

## 2017-09-06 DIAGNOSIS — M79604 Pain in right leg: Secondary | ICD-10-CM

## 2017-09-06 DIAGNOSIS — W19XXXD Unspecified fall, subsequent encounter: Secondary | ICD-10-CM | POA: Insufficient documentation

## 2017-09-06 DIAGNOSIS — Z96612 Presence of left artificial shoulder joint: Secondary | ICD-10-CM | POA: Diagnosis not present

## 2017-09-06 DIAGNOSIS — S8991XD Unspecified injury of right lower leg, subsequent encounter: Secondary | ICD-10-CM

## 2017-09-06 NOTE — ED Triage Notes (Signed)
Pt states he had injury to right LE 2/8-was seen here 2/10-states area is worse with redness, swelling-pt wearing cam walker

## 2017-09-06 NOTE — ED Notes (Signed)
Pt refused d/c vitals.

## 2017-09-06 NOTE — ED Provider Notes (Signed)
[facility] EMERGENCY DEPARTMENT Provider Note   CSN: [id number] Arrival date & time: 09/06/17  1820     History   Chief Complaint Chief Complaint  Patient presents with  . Leg Pain    HPI Mason [name] is a 77 y.o. male past medical history of arthritis, CHF, heart block, neuropathy who presents for evaluation of continued right lower extremity pain, swelling, redness.  Patient reports that he was seen on 09/02/17 for evaluation of right ankle pain after mechanical fall that occurred approximately 1 week ago.  Patient reports that he was walking when he fell into a hole, causing him to twist his ankle.  Patient was seen here in the ED and had x-ray imaging at that time that showed a small avulsion fracture.  Patient was given a cam boot and was instructed to follow-up.  Patient returns today because he has had continued worsening pain to the right foot and leg.  Additionally, he has noticed worsening redness/swelling to the leg.  No warmth noted.  Patient states that he has been able to ambulate with the assistance of the cam boot but does repeat worsening pain with ambulation.  Patient denies any fevers, numbness/weakness.  The history is provided by the patient.    Past Medical History:  Diagnosis Date  . AICD (automatic cardioverter/defibrillator) present   . Arthritis    left shoulder (Dr. [name]): steroid injection 04/03/16.  Plan for TSA surgery 06/2016.  [name] BPH (benign prostatic hypertrophy) 06/2011   Irritative sx's; pt declined trial of anticholinergic per Urology records  . CHF (congestive heart failure) ([place])   . Chronic combined systolic and diastolic heart failure ([place]) 05/31/2012   Nonischemic:  EF 40-45%, LA mod-severe dilated, AFIB.   02/2016 EF 40%, diffuse hypokinesis, grade 2 DD.  Myoc perf imaging showed EF 32% 04/2016.  Pt upgraded to CRT-D 01/04/17.  [name] Chronic low back pain   . Chronic renal insufficiency, stage III (moderate) (HCC) 2015   CrCl  about 60 ml/min  . Complete heart block (HCC)    Has dual chamber pacer.  . Depression   . DOE (dyspnea on exertion)    NYHA class I CHF  . Dyspnea    with exertion  . Episodic low back pain 01/22/2013   w/intermittent radiculitis (12/2014 his neurologist referred him to pain mgmt for epidural steroid injection)  . GERD (gastroesophageal reflux disease)   . H/O tilt table evaluation 11/02/05   negative  . Helicobacter pylori gastritis 01/2016  . High cholesterol   . History of adenomatous polyp of colon 10/12/11   Dr. [name] (3 right side of colon- tubular adenomas removed)  . History of cardiovascular stress test 05/28/12   no ischemia, EF 37%, imaging results are unchanged and within normal variance  . History of chronic prostatitis   . History of kidney stones   . History of vertigo    + Hx of posterior HA's.  Neuro (Dr. Erling Cruz) eval 2011.  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  . Hypertension   . Lumbar spondylosis    lumbosacral radiculopathy at L4 by EMG testing, right foot drop (neurologist is Dr. Linus Salmons with [facility] in [place])--neurologist referred him to neurosurgery  . Migraine    "used to have them all the time; none for years" (01/04/2017)  . Myocardial infarction [facility]) ?1970s  . Nephrolithiasis 07/2012   Left UVJ 2 mm stone with dilation of renal collecting  system and slight hydroureter on right  . Neuropathy   . 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  Since no CAD on cath 05/2016, cards recommends upgrade to CRT-D.  [name] Permanent atrial fibrillation ([place])    DCCV 07/09/13-converted, lasted two days, then back into afib--needs lifetime anticoagulation (Xarelto as of 09/2014)  . Skin cancer of arm, left    "burned it off" (01/04/2017)  . Type II diabetes mellitus [facility])     Patient Active Problem List   Diagnosis Date Noted    . CHF (congestive heart failure) ([place]) 01/04/2017  . Chronic systolic heart failure ([place]) 08/04/2016  . Chronic renal insufficiency, stage II (mild) 12/09/2015  . Uncontrolled hypertension 10/11/2015  . Permanent atrial fibrillation ([place]) 06/22/2015  . Diabetes mellitus with complication ([place]) 38/25/0539  . Cellulitis of finger of left hand 11/20/2014  . Swollen R ankle 05/25/2014  . Leg fatigue 02/15/2014  . Lumbar back pain with radiculopathy affecting left lower extremity 02/15/2014  . Other malaise and fatigue 02/05/2014  . Cervical muscle strain 11/27/2013  . Osteoarthritis of right knee 10/14/2013  . HTN (hypertension), benign 05/26/2013  . Acute low back pain with radicular symptoms, duration less than 6 weeks 05/12/2013  . Hyperlipidemia 04/21/2013  . DM2 (diabetes mellitus, type 2) ([place]) 04/21/2013  . Atrial fibrillation, permanent 03/08/2013  . Left ventricular dysfunction 05/31/2012  . Near syncope 02/06/2012  . AVB (atrioventricular block) 02/06/2012  . Cardiac pacemaker 02/05/12 02/06/2012  . Dyslipidemia 02/06/2012    Past Surgical History:  Procedure Laterality Date  . BACK SURGERY    . BI-VENTRICULAR IMPLANTABLE CARDIOVERTER DEFIBRILLATOR  (CRT-D) Left 01/04/2017  . BIV ICD INSERTION CRT-D N/A 01/04/2017   Procedure: BiV ICD ;  Surgeon: Constance Haw, MD;  Location: [place] CV LAB;  Service: Cardiovascular;  Laterality: N/A;  . CARDIAC CATHETERIZATION N/A 06/14/2016   Minimal nonobstructive dz, EF 25-35%.  Procedure: Left Heart Cath and Coronary Angiography;  Surgeon: Peter M Martinique, MD;  Location: [place] CV LAB;  Service: Cardiovascular;  Laterality: N/A;  . CARDIOVASCULAR STRESS TEST  2012   2012 nuclear perfusion study: low risk scan; 04/2016 normal myocardial perfusion imaging, EF 32%.  [name] CARDIOVERSION  07/09/2012   Procedure: CARDIOVERSION;  Surgeon: [name], MD;  Location: [facility];  Service: Cardiovascular;  Laterality: N/A;  .  CATARACT EXTRACTION W/ INTRAOCULAR LENS IMPLANT & ANTERIOR VITRECTOMY, BILATERAL Bilateral   . COLONOSCOPY W/ POLYPECTOMY  approx 2006; repeated 09/2011   Polyps on 2013 EGD as well, repeat 12/2014  . ESOPHAGOGASTRODUODENOSCOPY  10/18/06   Done due to chronic GERD: Normal, bx showed no barrett's esophagus (Dr. [name])  . FLEXOR TENDON REPAIR Left 10/02/2016   Procedure: LEFT RING FINGER WOUND EXPORATION AND FLEXOR TENDON REPAIR AND NERVE REPAIR;  Surgeon: Milly Jakob, MD;  Location: [facility];  Service: Orthopedics;  Laterality: Left;  . INSERT / REPLACE / REMOVE PACEMAKER  02/05/2012   dual chamber, sinus node dysfunction, sinus arrest, PAF, Medtronic Revo serial#-[id number]: last checked 05/2015  . LUMBAR LAMINECTOMY Left 1976   L4-5  . PACEMAKER REMOVAL  01/04/2017  . PERMANENT PACEMAKER INSERTION N/A 02/05/2012   Procedure: PERMANENT PACEMAKER INSERTION;  Surgeon: [name], MD; Generator Medtronic Revo model [place] serial number [id number] H Laterality: N/A;  . RETINAL DETACHMENT SURGERY Left ~ 1999  . REVERSE SHOULDER ARTHROPLASTY Left 2018   Left shoulder reverse TSA [name] in [place]).  . TRANSTHORACIC ECHOCARDIOGRAM  08/25/10; 05/2012;  03/23/16   mild asymmetric LVH, normal systolic function, normal diastolic fxn, mild-to-mod mitral regurg, mild aortic valve sclerosis and trace AI, mild aortic root dilatation. 2014 f/u showed EF 40-45%, mod LAE, A FIB.  02/2016 EF 40%, diffuse hypokinesis, grade 2 DD.       Home Medications    Prior to Admission medications   Medication Sig Start Date End Date Taking? Authorizing Provider  acetaminophen (TYLENOL) 325 MG tablet Take 2 tablets (650 mg total) by mouth every 6 (six) hours as needed for mild pain or moderate pain. 10/02/16   Milly Jakob, MD  colchicine 0.6 MG tablet Take 0.6 mg by mouth daily as needed (for gout).  08/20/15   [provider]  COMBIGAN 0.2-0.5 % ophthalmic solution Place 1 drop into both eyes 2 (two)  times daily.  04/19/16   [provider]  ENTRESTO 97-103 MG TAKE 1 TABLET TWICE A DAY 03/19/17   Croitoru, Mihai, MD  gabapentin (NEURONTIN) 300 MG capsule TAKE 1 CAPSULE THREE TIMES A DAY Patient taking differently: Take 2 capsules in the AM and 1 capsule at bedtime. 11/13/16   McGowen, Adrian Blackwater, MD  GLIPIZIDE XL 5 MG 24 hr tablet TAKE 1 TABLET EVERY MORNING 04/25/17   McGowen, Adrian Blackwater, MD  glucose blood test strip Use to check blood sugars 1-2 times daily 06/07/17   McGowen, Adrian Blackwater, MD  JANUVIA 100 MG tablet TAKE 1 TABLET DAILY 04/25/17   McGowen, Adrian Blackwater, MD  latanoprost (XALATAN) 0.005 % ophthalmic solution Place 1 drop into both eyes at bedtime.  04/24/15   [provider]  meclizine (ANTIVERT) 25 MG tablet Take 1 tablet (25 mg total) 3 (three) times daily as needed by mouth for dizziness. 06/11/17   Tammi Sou, MD  metFORMIN (GLUCOPHAGE) 1000 MG tablet TAKE 1 TABLET TWICE A DAY 04/25/17   McGowen, Adrian Blackwater, MD  omeprazole (PRILOSEC) 40 MG capsule TAKE 1 CAPSULE DAILY 05/14/17   McGowen, Adrian Blackwater, MD  ONE TOUCH LANCETS MISC Use to check blood sugar 1-2 times daily 06/28/16   McGowen, Adrian Blackwater, MD  rOPINIRole (REQUIP) 1 MG tablet Take 1 tablet (1 mg total) by mouth daily. 04/02/17   McGowen, Adrian Blackwater, MD  sertraline (ZOLOFT) 100 MG tablet TAKE 1 TABLET DAILY 03/01/17   McGowen, Adrian Blackwater, MD  simvastatin (ZOCOR) 20 MG tablet TAKE 1 TABLET EVERY EVENING 04/25/17   McGowen, Adrian Blackwater, MD  TOPROL XL 100 MG 24 hr tablet TAKE ONE AND ONE-HALF TABLETS DAILY 03/08/17   McGowen, Adrian Blackwater, MD  XARELTO 20 MG TABS tablet TAKE 1 TABLET DAILY WITH SUPPER 04/24/17   Croitoru, Mihai, MD    Family History Family History  Problem Relation Age of Onset  . Heart failure Mother   . Stroke Mother   . Stroke Father     Social History Social History   Tobacco Use  . Smoking status: Never Smoker  . Smokeless tobacco: Never Used  Substance Use Topics  . Alcohol use: Yes    Comment: occ    . Drug use: No     Allergies   Patient has no known allergies.   Review of Systems Review of Systems  Constitutional: Negative for fever.  Musculoskeletal:       RLE pain and swelling  Skin: Positive for color change. Negative for wound.  Neurological: Negative for weakness and numbness.     Physical Exam Updated Vital Signs BP 136/81 (BP Location: Left Arm)  Pulse 84   Temp 98.8 F (37.1 C) (Oral)   Resp 18   Ht 5\' 11"  (1.803 m)   Wt 103.9 kg (229 lb)   SpO2 99%   BMI 31.94 kg/m   Physical Exam  Constitutional: He appears well-developed and well-nourished.  HENT:  Head: Normocephalic and atraumatic.  Eyes: Conjunctivae and EOM are normal. Right eye exhibits no discharge. Left eye exhibits no discharge. No scleral icterus.  Cardiovascular:  Pulses:      Dorsalis pedis pulses are 2+ on the right side, and 2+ on the left side.  Pulmonary/Chest: Effort normal.  Musculoskeletal:  There is palpation to the lateral aspect of the right ankle.  Limited range of motion secondary to pain.  There is diffuse overlying soft tissue swelling to the foot, ankle to the distal leg.  No overlying warmth, erythema, induration.  Neurological: He is alert.  Skin: Skin is warm and dry.  Delayed cap refill. RLE is dusky in appearance is not cool to touch.  His left lower externally also appears to contain appearance appears to be chronic from chronic venous insufficiency.  Psychiatric: He has a normal mood and affect. His speech is normal and behavior is normal.  Nursing note and vitals reviewed.    ED Treatments / Results  Labs (all labs ordered are listed, but only abnormal results are displayed) Labs Reviewed - No data to display  EKG  EKG Interpretation None       Radiology Dg Ankle Complete Right  Result Date: 09/06/2017 CLINICAL DATA:  Right ankle pain after stepping into a hole. EXAM: RIGHT ANKLE - COMPLETE 3+ VIEW COMPARISON:  09/02/2017 FINDINGS: Dorsal and  plantar calcaneal enthesopathy. Moderate soft tissue swelling about the malleoli more so laterally. Small ankle joint effusion. The ankle mortise is maintained. Vascular channel is seen of the distal fibular diaphysis. Base of fifth metatarsal appears intact. IMPRESSION: Soft tissue swelling about the malleoli. Small ankle joint effusion. No acute displaced fracture. Electronically Signed   By: Ashley Royalty M.D.   On: 09/06/2017 22:24   US Venous Img Lower Unilateral Right  Result Date: 09/06/2017 CLINICAL DATA:  Stepped in hole several days ago with persistent pain and swelling EXAM: Right LOWER EXTREMITY VENOUS DOPPLER ULTRASOUND TECHNIQUE: Gray-scale sonography with graded compression, as well as color Doppler and duplex ultrasound were performed to evaluate the lower extremity deep venous systems from the level of the common femoral vein and including the common femoral, femoral, profunda femoral, popliteal and calf veins including the posterior tibial, peroneal and gastrocnemius veins when visible. The superficial great saphenous vein was also interrogated. Spectral Doppler was utilized to evaluate flow at rest and with distal augmentation maneuvers in the common femoral, femoral and popliteal veins. COMPARISON:  None. FINDINGS: Contralateral Common Femoral Vein: Respiratory phasicity is normal and symmetric with the symptomatic side. No evidence of thrombus. Normal compressibility. Common Femoral Vein: No evidence of thrombus. Normal compressibility, respiratory phasicity and response to augmentation. Saphenofemoral Junction: No evidence of thrombus. Normal compressibility and flow on color Doppler imaging. Profunda Femoral Vein: No evidence of thrombus. Normal compressibility and flow on color Doppler imaging. Femoral Vein: No evidence of thrombus. Normal compressibility, respiratory phasicity and response to augmentation. Popliteal Vein: No evidence of thrombus. Normal compressibility, respiratory  phasicity and response to augmentation. Calf Veins: No evidence of thrombus. Normal compressibility and flow on color Doppler imaging. Superficial Great Saphenous Vein: No evidence of thrombus. Normal compressibility. Venous Reflux:  None. Other Findings:  Subcutaneous edema  is noted. IMPRESSION: No evidence of deep venous thrombosis. Electronically Signed   By: Inez Catalina M.D.   On: 09/06/2017 20:16    Procedures Procedures (including critical care time)  Medications Ordered in ED Medications - No data to display   Initial Impression / Assessment and Plan / ED Course  I have reviewed the triage vital signs and the nursing notes.  Pertinent labs & imaging results that were available during my care of the patient were reviewed by me and considered in my medical decision making (see chart for details).     76 year old male who presents for evaluation of persistent right lower extremity pain, swelling, discoloration.  He was seen at [facility] ED on 09/02/17 for evaluation of right ankle pain.  At that time, x-ray revealed a small avulsion fracture.  He was given a cam boot and instructed to follow-up with orthopedics which she has an appointment within 3 days.  Comes the emergency department today because he has continued to have persistent pain, soft tissue swelling.  He does notice some redness and swelling to the area but denies any warmth, fevers.  He has been elevating the foot and applying ice.  Patient states that he will intermittently use the Cam walker that he was discharged with.  He denies any other preceding trauma, injury, fall.  On exam, good pulses.  He does exhibit some dusky appearance but appears to be bilateral feet and appears to be chronic in nature secondary to chronic venous insufficiency.  Consider soft tissue swelling secondary to preceding avulsion fracture versus sprain versus new injury.  Also consider DVT.  History/physical exam not concerning for cellulitis.  Ultrasound  ordered at triage.  Ultrasound reviewed.  Negative for any acute DVT.  Gust results with patient.  We will plan to get x-ray for further evaluation for new injury.  X-ray reviewed.  Negative for any acute new injury.  Instructed patient to continue elevating and icing the foot.  He is instructed to follow-up with outpatient orthopedic referral who he has been referred to and has an appointment within the next 3 days.  Supportive care therapies discussed with patient and family. Patient had ample opportunity for questions and discussion. All patient's questions were answered with full understanding. Strict return precautions discussed. Patient expresses understanding and agreement to plan.   Final Clinical Impressions(s) / ED Diagnoses   Final diagnoses:  Right ankle pain, unspecified chronicity  Sprain of right ankle, unspecified ligament, initial encounter    ED Discharge Orders    None       Desma Mcgregor 09/06/17 [id number]    Macarthur Critchley, MD 09/06/17 [id number]

## 2017-09-06 NOTE — ED Notes (Signed)
ED Provider at bedside. 

## 2017-09-06 NOTE — Discharge Instructions (Signed)
Elevate the leg as we discussed.  Apply ice as requested.  As we discussed, you need to keep the appointment with orthopedic doctor.  Wear the cam boot as directed.  Return the emergency department for any worsening pain, swelling the foot, numbness or weakness of the foot or any other worsening or concerning symptoms.

## 2017-09-10 ENCOUNTER — Encounter: Payer: Self-pay | Admitting: Family Medicine

## 2017-09-10 ENCOUNTER — Ambulatory Visit (INDEPENDENT_AMBULATORY_CARE_PROVIDER_SITE_OTHER): Payer: Medicare Other | Admitting: Family Medicine

## 2017-09-10 DIAGNOSIS — S99911A Unspecified injury of right ankle, initial encounter: Secondary | ICD-10-CM

## 2017-09-10 DIAGNOSIS — S99911D Unspecified injury of right ankle, subsequent encounter: Secondary | ICD-10-CM | POA: Insufficient documentation

## 2017-09-10 NOTE — Assessment & Plan Note (Signed)
independently reviewed radiographs and these are negative.  Ultrasound does show a small fragment off distal fibula consistent with small avulsion distal fibula.  Discussed these typically heal over about 6 weeks, treated similar to a severe ankle sprain.  He will consider ASO vs his cam walker.  Tylenol, topical medications.  Initially recommended ibuprofen/aleve but he's on xarelto - advised not to take with this.  Shown home exercises to do daily.  F/u in 2-3 weeks.

## 2017-09-10 NOTE — Patient Instructions (Signed)
You have a small avulsion fracture of your distal fibula by ultrasound. Ice the area for 15 minutes at a time, 3-4 times a day Aleve 2 tabs twice a day with food OR ibuprofen 3 tabs three times a day with food for pain and inflammation as needed. Elevate above the level of your heart when possible Bear weight when tolerated Use boot OR laceup brace when up and walking around to help with stability while you recover from this injury. Come out of the boot/brace twice a day to do Up/down and alphabet exercises 2-3 sets of each. Start theraband strengthening exercises when comfortable with yellow theraband - once a day 3 sets of 10. Consider physical therapy for strengthening and balance exercises. If not improving as expected, we may repeat x-rays or consider further testing like an MRI. Follow up in 2-3 weeks.

## 2017-09-10 NOTE — Progress Notes (Signed)
PCP: Tammi Sou, MD  Subjective:   HPI: Patient is a 77 y.o. male here for right ankle injury.  Patient reports on 2/8 he was walking in his yard when he stepped in a hole and inverted his right ankle. Immediate pain, difficulty bearing weight. Pain is still at a 7/10 and sharp laterally. Better in the cam walker but does not like this. Has been taking ibuprofen, icing, and elevating. No other skin changes, numbness. No known prior injuries.  Past Medical History:  Diagnosis Date  . AICD (automatic cardioverter/defibrillator) present   . Arthritis    left shoulder (Dr. [name]): steroid injection 04/03/16.  Plan for TSA surgery 06/2016.  [name] BPH (benign prostatic hypertrophy) 06/2011   Irritative sx's; pt declined trial of anticholinergic per Urology records  . CHF (congestive heart failure) ([place])   . Chronic combined systolic and diastolic heart failure ([place]) 05/31/2012   Nonischemic:  EF 40-45%, LA mod-severe dilated, AFIB.   02/2016 EF 40%, diffuse hypokinesis, grade 2 DD.  Myoc perf imaging showed EF 32% 04/2016.  Pt upgraded to CRT-D 01/04/17.  [name] Chronic low back pain   . Chronic renal insufficiency, stage III (moderate) (HCC) 2015   CrCl about 60 ml/min  . Complete heart block (HCC)    Has dual chamber pacer.  . Depression   . DOE (dyspnea on exertion)    NYHA class I CHF  . Dyspnea    with exertion  . Episodic low back pain 01/22/2013   w/intermittent radiculitis (12/2014 his neurologist referred him to pain mgmt for epidural steroid injection)  . GERD (gastroesophageal reflux disease)   . H/O tilt table evaluation 11/02/05   negative  . Helicobacter pylori gastritis 01/2016  . High cholesterol   . History of adenomatous polyp of colon 10/12/11   Dr. [name] (3 right side of colon- tubular adenomas removed)  . History of cardiovascular stress test 05/28/12   no ischemia, EF 37%, imaging results are unchanged and within normal variance  . History of chronic  prostatitis   . History of kidney stones   . History of vertigo    + Hx of posterior HA's.  Neuro (Dr. Erling Cruz) eval 2011.  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  . Hypertension   . Lumbar spondylosis    lumbosacral radiculopathy at L4 by EMG testing, right foot drop (neurologist is Dr. Linus Salmons with [facility] in [place])--neurologist referred him to neurosurgery  . Migraine    "used to have them all the time; none for years" (01/04/2017)  . Myocardial infarction [facility]) ?1970s  . Nephrolithiasis 07/2012   Left UVJ 2 mm stone with dilation of renal collecting system and slight hydroureter on right  . Neuropathy   . 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  Since no CAD on cath 05/2016, cards recommends upgrade to CRT-D.  [name] Permanent atrial fibrillation ([place])    DCCV 07/09/13-converted, lasted two days, then back into afib--needs lifetime anticoagulation (Xarelto as of 09/2014)  . Skin cancer of arm, left    "burned it off" (01/04/2017)  . Type II diabetes mellitus ([place])     Current Outpatient Medications on File Prior to Visit  Medication Sig Dispense Refill  . acetaminophen (TYLENOL) 325 MG tablet Take 2 tablets (650 mg total) by mouth every 6 (six) hours as needed for mild pain or moderate pain.    [name] colchicine 0.6 MG  tablet Take 0.6 mg by mouth daily as needed (for gout).     . COMBIGAN 0.2-0.5 % ophthalmic solution Place 1 drop into both eyes 2 (two) times daily.     [name] ENTRESTO 97-103 MG TAKE 1 TABLET TWICE A DAY 180 tablet 1  . gabapentin (NEURONTIN) 300 MG capsule TAKE 1 CAPSULE THREE TIMES A DAY (Patient taking differently: Take 2 capsules in the AM and 1 capsule at bedtime.) 270 capsule 3  . GLIPIZIDE XL 5 MG 24 hr tablet TAKE 1 TABLET EVERY MORNING 90 tablet 1  . glucose blood test strip Use to check blood sugars 1-2 times daily 100 each 3   . JANUVIA 100 MG tablet TAKE 1 TABLET DAILY 90 tablet 1  . latanoprost (XALATAN) 0.005 % ophthalmic solution Place 1 drop into both eyes at bedtime.   12  . meclizine (ANTIVERT) 25 MG tablet Take 1 tablet (25 mg total) 3 (three) times daily as needed by mouth for dizziness. 270 tablet 3  . metFORMIN (GLUCOPHAGE) 1000 MG tablet TAKE 1 TABLET TWICE A DAY 180 tablet 1  . omeprazole (PRILOSEC) 40 MG capsule TAKE 1 CAPSULE DAILY 90 capsule 1  . ONE TOUCH LANCETS MISC Use to check blood sugar 1-2 times daily 200 each 0  . rOPINIRole (REQUIP) 1 MG tablet Take 1 tablet (1 mg total) by mouth daily. 90 tablet 3  . sertraline (ZOLOFT) 100 MG tablet TAKE 1 TABLET DAILY 90 tablet 1  . simvastatin (ZOCOR) 20 MG tablet TAKE 1 TABLET EVERY EVENING 90 tablet 1  . TOPROL XL 100 MG 24 hr tablet TAKE ONE AND ONE-HALF TABLETS DAILY 135 tablet 1  . XARELTO 20 MG TABS tablet TAKE 1 TABLET DAILY WITH SUPPER 90 tablet 1   No current facility-administered medications on file prior to visit.     Past Surgical History:  Procedure Laterality Date  . BACK SURGERY    . BI-VENTRICULAR IMPLANTABLE CARDIOVERTER DEFIBRILLATOR  (CRT-D) Left 01/04/2017  . BIV ICD INSERTION CRT-D N/A 01/04/2017   Procedure: BiV ICD ;  Surgeon: Constance Haw, MD;  Location: [place] [facility];  Service: Cardiovascular;  Laterality: N/A;  . CARDIAC CATHETERIZATION N/A 06/14/2016   Minimal nonobstructive dz, EF 25-35%.  Procedure: Left Heart Cath and Coronary Angiography;  Surgeon: Peter M Martinique, MD;  Location: [place] [facility];  Service: Cardiovascular;  Laterality: N/A;  . CARDIOVASCULAR STRESS TEST  2012   2012 nuclear perfusion study: low risk scan; 04/2016 normal myocardial perfusion imaging, EF 32%.  [name] CARDIOVERSION  07/09/2012   Procedure: CARDIOVERSION;  Surgeon: [name], MD;  Location: [place];  Service: Cardiovascular;  Laterality: N/A;  . CATARACT EXTRACTION W/ INTRAOCULAR LENS IMPLANT & ANTERIOR VITRECTOMY,  BILATERAL Bilateral   . COLONOSCOPY W/ POLYPECTOMY  approx 2006; repeated 09/2011   Polyps on 2013 EGD as well, repeat 12/2014  . ESOPHAGOGASTRODUODENOSCOPY  10/18/06   Done due to chronic GERD: Normal, bx showed no barrett's esophagus (Dr. [name])  . FLEXOR TENDON REPAIR Left 10/02/2016   Procedure: LEFT RING FINGER WOUND EXPORATION AND FLEXOR TENDON REPAIR AND NERVE REPAIR;  Surgeon: Milly Jakob, MD;  Location: [place];  Service: Orthopedics;  Laterality: Left;  . INSERT / REPLACE / REMOVE PACEMAKER  02/05/2012   dual chamber, sinus node dysfunction, sinus arrest, PAF, Medtronic Revo serial#-[id number] H: last checked 05/2015  . LUMBAR LAMINECTOMY Left 1976   L4-5  . PACEMAKER REMOVAL  01/04/2017  . PERMANENT PACEMAKER INSERTION N/A 02/05/2012   Procedure:  PERMANENT PACEMAKER INSERTION;  Surgeon: [name], MD; Generator Medtronic [place] model [place] serial number [id number] H Laterality: N/A;  . RETINAL DETACHMENT SURGERY Left ~ 1999  . REVERSE SHOULDER ARTHROPLASTY Left 2018   Left shoulder reverse TSA [name] [place]  . TRANSTHORACIC ECHOCARDIOGRAM  08/25/10; 05/2012; 03/23/16   mild asymmetric LVH, normal systolic function, normal diastolic fxn, mild-to-mod mitral regurg, mild aortic valve sclerosis and trace AI, mild aortic root dilatation. 2014 f/u showed EF 40-45%, mod LAE, A FIB.  02/2016 EF 40%, diffuse hypokinesis, grade 2 DD.    No Known Allergies  Social History   Socioeconomic History  . Marital status: Married    Spouse name: Not on file  . Number of children: Not on file  . Years of education: Not on file  . Highest education level: Not on file  Social Needs  . Financial resource strain: Not on file  . Food insecurity - worry: Not on file  . Food insecurity - inability: Not on file  . Transportation needs - medical: Not on file  . Transportation needs - non-medical: Not on file  Occupational History  . Not on file  Tobacco Use  . Smoking status: Never  Smoker  . Smokeless tobacco: Never Used  Substance and Sexual Activity  . Alcohol use: Yes    Comment: occ  . Drug use: No  . Sexual activity: Not on file  Other Topics Concern  . Not on file  Social History Narrative  . Not on file    Family History  Problem Relation Age of Onset  . Heart failure Mother   . Stroke Mother   . Stroke Father     BP 121/85   Pulse 80   Ht 5\' 11"  (1.803 m)   Wt 229 lb (103.9 kg)   BMI 31.94 kg/m   Review of Systems: See HPI above.     Objective:  Physical Exam:  Gen: NAD, comfortable in exam room  Right ankle: Mild lateral swelling.  No bruising, other deformity. Mild limitation all directions with 5/5 strength.  Pain with IR. TTP lateral malleolus and over ATFL.  No other tenderness of foot or ankle. Negative ant drawer and talar tilt.   Negative syndesmotic compression. Thompsons test negative. NV intact distally.  Left ankle: No deformity. FROM with 5/5 strength. No tenderness to palpation. NVI distally.  MSK u/s right ankle: noted small avulsion fragment off distal malleolus.  Ankle effusion.  Peroneal tendons thickened with tenosynovitis but no tears seen.  No other abnormalities lateral ankle.   Assessment & Plan:  1. Right ankle injury - independently reviewed radiographs and these are negative.  Ultrasound does show a small fragment off distal fibula consistent with small avulsion distal fibula.  Discussed these typically heal over about 6 weeks, treated similar to a severe ankle sprain.  He will consider ASO vs his cam walker.  Tylenol, topical medications.  Initially recommended ibuprofen/aleve but he's on xarelto - advised not to take with this.  Shown home exercises to do daily.  F/u in 2-3 weeks.

## 2017-09-12 LAB — CUP PACEART INCLINIC DEVICE CHECK
Date Time Interrogation Session: 20190220164702
Implantable Lead Implant Date: 20130715
Implantable Lead Implant Date: 20180614
Implantable Lead Implant Date: 20180614
Implantable Lead Location: 753858
Implantable Lead Location: 753859
Implantable Lead Location: 753860
Implantable Lead Model: 4598
Implantable Pulse Generator Implant Date: 20180614
Lead Channel Setting Pacing Amplitude: 1.25 V
Lead Channel Setting Pacing Amplitude: 2 V
Lead Channel Setting Pacing Pulse Width: 0.4 ms
Lead Channel Setting Pacing Pulse Width: 1 ms
Lead Channel Setting Sensing Sensitivity: 0.3 mV

## 2017-09-20 DIAGNOSIS — R972 Elevated prostate specific antigen [PSA]: Secondary | ICD-10-CM | POA: Diagnosis not present

## 2017-09-20 LAB — LAB REPORT - SCANNED: PSA, Total: 0.84

## 2017-09-21 DIAGNOSIS — Z125 Encounter for screening for malignant neoplasm of prostate: Secondary | ICD-10-CM

## 2017-09-21 HISTORY — DX: Encounter for screening for malignant neoplasm of prostate: Z12.5

## 2017-09-22 ENCOUNTER — Encounter: Payer: Self-pay | Admitting: Family Medicine

## 2017-09-24 ENCOUNTER — Ambulatory Visit: Payer: Medicare Other | Admitting: Family Medicine

## 2017-09-25 DIAGNOSIS — H401123 Primary open-angle glaucoma, left eye, severe stage: Secondary | ICD-10-CM | POA: Diagnosis not present

## 2017-09-25 DIAGNOSIS — H401112 Primary open-angle glaucoma, right eye, moderate stage: Secondary | ICD-10-CM | POA: Diagnosis not present

## 2017-09-28 ENCOUNTER — Encounter: Payer: Self-pay | Admitting: Family Medicine

## 2017-09-28 ENCOUNTER — Ambulatory Visit (INDEPENDENT_AMBULATORY_CARE_PROVIDER_SITE_OTHER): Payer: Medicare Other | Admitting: Family Medicine

## 2017-09-28 DIAGNOSIS — S99911D Unspecified injury of right ankle, subsequent encounter: Secondary | ICD-10-CM

## 2017-09-28 NOTE — Patient Instructions (Signed)
Use the ace wrap or laceup ankle brace only as needed at this point. Tylenol, icing, topical medications also only if needed. Do the home exercises though for another 3-4 weeks. Call me if you need anything otherwise follow up as needed.

## 2017-09-29 ENCOUNTER — Encounter: Payer: Self-pay | Admitting: Family Medicine

## 2017-09-29 NOTE — Assessment & Plan Note (Signed)
Radiographs were negative though ultrasound showed small distal fibula avulsion.  Clinically he's doing much better.  ACE wrap or ASO only if needed now.  Continue home exercises for 3-4 more weeks.  Tylenol, icing, topical medications only if needed.  F/u prn.

## 2017-09-29 NOTE — Progress Notes (Signed)
PCP: Tammi Sou, MD  Subjective:   HPI: Patient is a 77 y.o. male here for right ankle injury.  2/18: Patient reports on 2/8 he was walking in his yard when he stepped in a hole and inverted his right ankle. Immediate pain, difficulty bearing weight. Pain is still at a 7/10 and sharp laterally. Better in the cam walker but does not like this. Has been taking ibuprofen, icing, and elevating. No other skin changes, numbness. No known prior injuries.  3/9: Patient reports he's doing well. Using either the ASO or ace wrap at times. Pain can be dull and lateral up to 5-6/10 but doesn't last. No pain currently. Doing home exercises. Not taking any medicines for this. No skin changes, numbness.  Past Medical History:  Diagnosis Date  . AICD (automatic cardioverter/defibrillator) present   . Arthritis    left shoulder (Dr. [name]): steroid injection 04/03/16.  Plan for TSA surgery 06/2016.  [name] BPH (benign prostatic hypertrophy) 06/2011   Irritative sx's; pt declined trial of anticholinergic per Urology records  . CHF (congestive heart failure) ([place])   . Chronic combined systolic and diastolic heart failure ([place]) 05/31/2012   Nonischemic:  EF 40-45%, LA mod-severe dilated, AFIB.   02/2016 EF 40%, diffuse hypokinesis, grade 2 DD.  Myoc perf imaging showed EF 32% 04/2016.  Pt upgraded to CRT-D 01/04/17.  [name] Chronic low back pain   . Chronic renal insufficiency, stage III (moderate) (HCC) 2015   CrCl about 60 ml/min  . Complete heart block (HCC)    Has dual chamber pacer.  . Depression   . DOE (dyspnea on exertion)    NYHA class I CHF  . Dyspnea    with exertion  . Episodic low back pain 01/22/2013   w/intermittent radiculitis (12/2014 his neurologist referred him to pain mgmt for epidural steroid injection)  . GERD (gastroesophageal reflux disease)   . H/O tilt table evaluation 11/02/05   negative  . Helicobacter pylori gastritis 01/2016  . High cholesterol   . History  of adenomatous polyp of colon 10/12/11   Dr. [name] (3 right side of colon- tubular adenomas removed)  . History of cardiovascular stress test 05/28/12   no ischemia, EF 37%, imaging results are unchanged and within normal variance  . History of chronic prostatitis   . History of kidney stones   . History of vertigo    + Hx of posterior HA's.  Neuro (Dr. Erling Cruz) eval 2011.  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  . Hypertension   . Lumbar spondylosis    lumbosacral radiculopathy at L4 by EMG testing, right foot drop (neurologist is Dr. Linus Salmons with [facility] in [place])--neurologist referred him to neurosurgery  . Migraine    "used to have them all the time; none for years" (01/04/2017)  . Myocardial infarction [facility]) ?1970s  . Nephrolithiasis 07/2012   Left UVJ 2 mm stone with dilation of renal collecting system and slight hydroureter on right  . Neuropathy   . 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  Since no CAD on cath 05/2016, cards recommends upgrade to CRT-D.  [name] Permanent atrial fibrillation ([place])    DCCV 07/09/13-converted, lasted two days, then back into afib--needs lifetime anticoagulation (Xarelto as of 09/2014)  . Right ankle sprain 08/2017   w/distal fibula avulsion fx (Dr. Barbaraann Barthel).  . Skin cancer of arm, left    "burned it off" (  01/04/2017)  . Type II diabetes mellitus ([place])     Current Outpatient Medications on File Prior to Visit  Medication Sig Dispense Refill  . acetaminophen (TYLENOL) 325 MG tablet Take 2 tablets (650 mg total) by mouth every 6 (six) hours as needed for mild pain or moderate pain.    [name] colchicine 0.6 MG tablet Take 0.6 mg by mouth daily as needed (for gout).     . COMBIGAN 0.2-0.5 % ophthalmic solution Place 1 drop into both eyes 2 (two) times daily.     [name] ENTRESTO 97-103 MG TAKE 1 TABLET TWICE A DAY 180  tablet 1  . gabapentin (NEURONTIN) 300 MG capsule TAKE 1 CAPSULE THREE TIMES A DAY (Patient taking differently: Take 2 capsules in the AM and 1 capsule at bedtime.) 270 capsule 3  . GLIPIZIDE XL 5 MG 24 hr tablet TAKE 1 TABLET EVERY MORNING 90 tablet 1  . glucose blood test strip Use to check blood sugars 1-2 times daily 100 each 3  . JANUVIA 100 MG tablet TAKE 1 TABLET DAILY 90 tablet 1  . latanoprost (XALATAN) 0.005 % ophthalmic solution Place 1 drop into both eyes at bedtime.   12  . meclizine (ANTIVERT) 25 MG tablet Take 1 tablet (25 mg total) 3 (three) times daily as needed by mouth for dizziness. 270 tablet 3  . metFORMIN (GLUCOPHAGE) 1000 MG tablet TAKE 1 TABLET TWICE A DAY 180 tablet 1  . omeprazole (PRILOSEC) 40 MG capsule TAKE 1 CAPSULE DAILY 90 capsule 1  . ONE TOUCH LANCETS MISC Use to check blood sugar 1-2 times daily 200 each 0  . rOPINIRole (REQUIP) 1 MG tablet Take 1 tablet (1 mg total) by mouth daily. 90 tablet 3  . sertraline (ZOLOFT) 100 MG tablet TAKE 1 TABLET DAILY 90 tablet 1  . simvastatin (ZOCOR) 20 MG tablet TAKE 1 TABLET EVERY EVENING 90 tablet 1  . TOPROL XL 100 MG 24 hr tablet TAKE ONE AND ONE-HALF TABLETS DAILY 135 tablet 1  . XARELTO 20 MG TABS tablet TAKE 1 TABLET DAILY WITH SUPPER 90 tablet 1   No current facility-administered medications on file prior to visit.     Past Surgical History:  Procedure Laterality Date  . BACK SURGERY    . BI-VENTRICULAR IMPLANTABLE CARDIOVERTER DEFIBRILLATOR  (CRT-D) Left 01/04/2017  . BIV ICD INSERTION CRT-D N/A 01/04/2017   Procedure: BiV ICD ;  Surgeon: Constance Haw, MD;  Location: [place] [facility];  Service: Cardiovascular;  Laterality: N/A;  . CARDIAC CATHETERIZATION N/A 06/14/2016   Minimal nonobstructive dz, EF 25-35%.  Procedure: Left Heart Cath and Coronary Angiography;  Surgeon: Peter M Martinique, MD;  Location: [place] [facility];  Service: Cardiovascular;  Laterality: N/A;  . CARDIOVASCULAR STRESS TEST  2012    2012 nuclear perfusion study: low risk scan; 04/2016 normal myocardial perfusion imaging, EF 32%.  [name] CARDIOVERSION  07/09/2012   Procedure: CARDIOVERSION;  Surgeon: [name], MD;  Location: [place];  Service: Cardiovascular;  Laterality: N/A;  . CATARACT EXTRACTION W/ INTRAOCULAR LENS IMPLANT & ANTERIOR VITRECTOMY, BILATERAL Bilateral   . COLONOSCOPY W/ POLYPECTOMY  approx 2006; repeated 09/2011   Polyps on 2013 EGD as well, repeat 12/2014  . ESOPHAGOGASTRODUODENOSCOPY  10/18/06   Done due to chronic GERD: Normal, bx showed no barrett's esophagus (Dr. [name])  . FLEXOR TENDON REPAIR Left 10/02/2016   Procedure: LEFT RING FINGER WOUND EXPORATION AND FLEXOR TENDON REPAIR AND NERVE REPAIR;  Surgeon: Milly Jakob, MD;  Location:  [place] [facility];  Service: Orthopedics;  Laterality: Left;  . INSERT / REPLACE / REMOVE PACEMAKER  02/05/2012   dual chamber, sinus node dysfunction, sinus arrest, PAF, Medtronic Revo serial#-[id number] H: last checked 05/2015  . LUMBAR LAMINECTOMY Left 1976   L4-5  . PACEMAKER REMOVAL  01/04/2017  . PERMANENT PACEMAKER INSERTION N/A 02/05/2012   Procedure: PERMANENT PACEMAKER INSERTION;  Surgeon: [name], MD; Generator Medtronic Revo model [place] serial number [id number] H Laterality: N/A;  . RETINAL DETACHMENT SURGERY Left ~ 1999  . REVERSE SHOULDER ARTHROPLASTY Left 2018   Left shoulder reverse TSA [name] [place]  . TRANSTHORACIC ECHOCARDIOGRAM  08/25/10; 05/2012; 03/23/16   mild asymmetric LVH, normal systolic function, normal diastolic fxn, mild-to-mod mitral regurg, mild aortic valve sclerosis and trace AI, mild aortic root dilatation. 2014 f/u showed EF 40-45%, mod LAE, A FIB.  02/2016 EF 40%, diffuse hypokinesis, grade 2 DD.    No Known Allergies  Social History   Socioeconomic History  . Marital status: Married    Spouse name: Not on file  . Number of children: Not on file  . Years of education: Not on file  . Highest education level: Not  on file  Social Needs  . Financial resource strain: Not on file  . Food insecurity - worry: Not on file  . Food insecurity - inability: Not on file  . Transportation needs - medical: Not on file  . Transportation needs - non-medical: Not on file  Occupational History  . Not on file  Tobacco Use  . Smoking status: Never Smoker  . Smokeless tobacco: Never Used  Substance and Sexual Activity  . Alcohol use: Yes    Comment: occ  . Drug use: No  . Sexual activity: Not on file  Other Topics Concern  . Not on file  Social History Narrative  . Not on file    Family History  Problem Relation Age of Onset  . Heart failure Mother   . Stroke Mother   . Stroke Father     BP 130/80   Ht 5\' 11"  (1.803 m)   Wt 229 lb (103.9 kg)   BMI 31.94 kg/m   Review of Systems: See HPI above.     Objective:  Physical Exam:  Gen: NAD, comfortable in exam room.  Right ankle: No gross deformity, swelling, ecchymoses Mild limitation motion all directions with 5/5 strength, no pain. No TTP Negative ant drawer and talar tilt.   Negative syndesmotic compression. Thompsons test negative. NV intact distally.   Assessment & Plan:  1. Right ankle injury - Radiographs were negative though ultrasound showed small distal fibula avulsion.  Clinically he's doing much better.  ACE wrap or ASO only if needed now.  Continue home exercises for 3-4 more weeks.  Tylenol, icing, topical medications only if needed.  F/u prn.

## 2017-10-01 DIAGNOSIS — N3281 Overactive bladder: Secondary | ICD-10-CM | POA: Diagnosis not present

## 2017-10-03 ENCOUNTER — Encounter: Payer: Self-pay | Admitting: Family Medicine

## 2017-10-03 ENCOUNTER — Other Ambulatory Visit: Payer: Self-pay | Admitting: Family Medicine

## 2017-10-15 ENCOUNTER — Encounter: Payer: Self-pay | Admitting: Family Medicine

## 2017-10-21 ENCOUNTER — Other Ambulatory Visit: Payer: Self-pay | Admitting: Cardiovascular Disease

## 2017-10-22 ENCOUNTER — Other Ambulatory Visit: Payer: Self-pay | Admitting: Family Medicine

## 2017-10-23 ENCOUNTER — Telehealth: Payer: Self-pay | Admitting: Cardiology

## 2017-10-23 ENCOUNTER — Ambulatory Visit (INDEPENDENT_AMBULATORY_CARE_PROVIDER_SITE_OTHER): Payer: Medicare Other | Admitting: *Deleted

## 2017-10-23 DIAGNOSIS — I428 Other cardiomyopathies: Secondary | ICD-10-CM

## 2017-10-23 NOTE — Telephone Encounter (Signed)
Spoke with pt and reminded pt of remote transmission that is due today. Pt verbalized understanding.   

## 2017-10-24 ENCOUNTER — Encounter: Payer: Self-pay | Admitting: Cardiology

## 2017-10-24 NOTE — Progress Notes (Signed)
Remote ICD transmission.   

## 2017-10-25 ENCOUNTER — Encounter: Payer: Self-pay | Admitting: Family Medicine

## 2017-10-25 ENCOUNTER — Ambulatory Visit: Payer: Medicare Other | Admitting: Family Medicine

## 2017-10-26 ENCOUNTER — Encounter: Payer: Self-pay | Admitting: Family Medicine

## 2017-10-26 ENCOUNTER — Ambulatory Visit (INDEPENDENT_AMBULATORY_CARE_PROVIDER_SITE_OTHER): Payer: Medicare Other | Admitting: Family Medicine

## 2017-10-26 VITALS — BP 110/78 | HR 74 | Resp 16 | Ht 71.0 in | Wt 230.0 lb

## 2017-10-26 DIAGNOSIS — E119 Type 2 diabetes mellitus without complications: Secondary | ICD-10-CM | POA: Diagnosis not present

## 2017-10-26 DIAGNOSIS — E7800 Pure hypercholesterolemia, unspecified: Secondary | ICD-10-CM

## 2017-10-26 DIAGNOSIS — I10 Essential (primary) hypertension: Secondary | ICD-10-CM | POA: Diagnosis not present

## 2017-10-26 DIAGNOSIS — N183 Chronic kidney disease, stage 3 unspecified: Secondary | ICD-10-CM

## 2017-10-26 LAB — BASIC METABOLIC PANEL
BUN: 21 mg/dL (ref 6–23)
CO2: 30 mEq/L (ref 19–32)
Calcium: 9.3 mg/dL (ref 8.4–10.5)
Chloride: 102 mEq/L (ref 96–112)
Creatinine, Ser: 1.38 mg/dL (ref 0.40–1.50)
GFR: 53.35 mL/min — ABNORMAL LOW (ref >60.00)
Glucose, Bld: 147 mg/dL — ABNORMAL HIGH (ref 70–99)
Potassium: 4.9 mEq/L (ref 3.5–5.1)
Sodium: 137 mEq/L (ref 135–145)

## 2017-10-26 LAB — HEMOGLOBIN A1C: Hgb A1c MFr Bld: 8 % — ABNORMAL HIGH (ref 4.6–6.5)

## 2017-10-26 NOTE — Progress Notes (Signed)
OFFICE VISIT  10/26/2017   CC:  Chief Complaint  Patient presents with  . Follow-up   HPI:    Patient is a 77 y.o. Caucasian male who presents for f/u DM 2, HTN, CRI stage III (GFR 50s), and HLD.  DM: only occ glucose check --he doesn't recall any numbers. No polyuria or polydipsia.  HTN: occ home bp check 125-135, 70s.    CRI: he feels like he does a good job of keeping fluid balance.  HLD: tolerating statin, compliant.  ROS: some disequilibrium feeling upon lying back into supine position as well as when he sits up from lying down.  No vertigo.  Very short lived--going on since the 90s. Chronic DOE with going up an incline.  Resting 30 sec he returns to normal.  No CP.  No palpitations.  Past Medical History:  Diagnosis Date  . AICD (automatic cardioverter/defibrillator) present   . Arthritis    left shoulder (Dr. [name]): steroid injection 04/03/16.  Plan for TSA surgery 06/2016.  [name] BPH (benign prostatic hypertrophy) 06/2011   Irritative sx's; pt declined trial of anticholinergic per Urology records  . CHF (congestive heart failure) ([place])   . Chronic combined systolic and diastolic heart failure ([place]) 05/31/2012   Nonischemic:  EF 40-45%, LA mod-severe dilated, AFIB.   02/2016 EF 40%, diffuse hypokinesis, grade 2 DD.  Myoc perf imaging showed EF 32% 04/2016.  Pt upgraded to CRT-D 01/04/17.  [name] Chronic low back pain   . Chronic renal insufficiency, stage III (moderate) (HCC) 2015   CrCl about 60 ml/min  . Complete heart block (HCC)    Has dual chamber pacer.  . Depression   . DOE (dyspnea on exertion)    NYHA class I CHF  . Dyspnea    with exertion  . Episodic low back pain 01/22/2013   w/intermittent radiculitis (12/2014 his neurologist referred him to pain mgmt for epidural steroid injection)  . Erectile dysfunction 2019   due to zoloft--urol rx'd viagra  . GERD (gastroesophageal reflux disease)   . H/O tilt table evaluation 11/02/05   negative  . Helicobacter  pylori gastritis 01/2016  . High cholesterol   . History of adenomatous polyp of colon 10/12/11   Dr. [name] (3 right side of colon- tubular adenomas removed)  . History of cardiovascular stress test 05/28/12   no ischemia, EF 37%, imaging results are unchanged and within normal variance  . History of chronic prostatitis   . History of kidney stones   . History of vertigo    + Hx of posterior HA's.  Neuro (Dr. Erling Cruz) eval 2011.  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  . Hypertension   . Lumbar spondylosis    lumbosacral radiculopathy at L4 by EMG testing, right foot drop (neurologist is Dr. Linus Salmons with [facility] in [place])--neurologist referred him to neurosurgery  . Migraine    "used to have them all the time; none for years" (01/04/2017)  . Myocardial infarction [facility]) ?1970s  . Nephrolithiasis 07/2012   Left UVJ 2 mm stone with dilation of renal collecting system and slight hydroureter on right  . Neuropathy   . 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  Since no CAD on cath 05/2016, cards recommends upgrade to CRT-D.  [name] Permanent atrial fibrillation ([place])    DCCV 07/09/13-converted, lasted two days, then back into afib--needs lifetime anticoagulation (Xarelto as of 09/2014)  .  Prostate cancer screening 09/2017   done by urol annually (normal prostate exam documented + PSA 0.84 as of 10/01/17 urol f/u.  . Right ankle sprain 08/2017   w/distal fibula avulsion fx noted on u/s but not plain film-(Dr. Hudnall).  . Skin cancer of arm, left    "burned it off" (01/04/2017)  . Type II diabetes mellitus ([place])     Past Surgical History:  Procedure Laterality Date  . BACK SURGERY    . BI-VENTRICULAR IMPLANTABLE CARDIOVERTER DEFIBRILLATOR  (CRT-D) Left 01/04/2017  . BIV ICD INSERTION CRT-D N/A 01/04/2017   Procedure: BiV ICD ;  Surgeon: Constance Haw, MD;  Location: [place] [facility];  Service: Cardiovascular;  Laterality: N/A;  . CARDIAC CATHETERIZATION N/A 06/14/2016   Minimal nonobstructive dz, EF 25-35%.  Procedure: Left Heart Cath and Coronary Angiography;  Surgeon: Peter M Martinique, MD;  Location: [place] [facility];  Service: Cardiovascular;  Laterality: N/A;  . CARDIOVASCULAR STRESS TEST  2012   2012 nuclear perfusion study: low risk scan; 04/2016 normal myocardial perfusion imaging, EF 32%.  [name] CARDIOVERSION  07/09/2012   Procedure: CARDIOVERSION;  Surgeon: [name], MD;  Location: [place];  Service: Cardiovascular;  Laterality: N/A;  . CATARACT EXTRACTION W/ INTRAOCULAR LENS IMPLANT & ANTERIOR VITRECTOMY, BILATERAL Bilateral   . COLONOSCOPY W/ POLYPECTOMY  approx 2006; repeated 09/2011   Polyps on 2013 EGD as well, repeat 12/2014  . ESOPHAGOGASTRODUODENOSCOPY  10/18/06   Done due to chronic GERD: Normal, bx showed no barrett's esophagus (Dr. [name])  . FLEXOR TENDON REPAIR Left 10/02/2016   Procedure: LEFT RING FINGER WOUND EXPORATION AND FLEXOR TENDON REPAIR AND NERVE REPAIR;  Surgeon: Milly Jakob, MD;  Location: [place];  Service: Orthopedics;  Laterality: Left;  . INSERT / REPLACE / REMOVE PACEMAKER  02/05/2012   dual chamber, sinus node dysfunction, sinus arrest, PAF, Medtronic Revo serial#-[id number] H: last checked 05/2015  . LUMBAR LAMINECTOMY Left 1976   L4-5  . PACEMAKER REMOVAL  01/04/2017  . PERMANENT PACEMAKER INSERTION N/A 02/05/2012   Procedure: PERMANENT PACEMAKER INSERTION;  Surgeon: [name], MD; Generator Medtronic Revo model [place] serial number [id number] H Laterality: N/A;  . RETINAL DETACHMENT SURGERY Left ~ 1999  . REVERSE SHOULDER ARTHROPLASTY Left 2018   Left shoulder reverse [name] [place]  . TRANSTHORACIC ECHOCARDIOGRAM  08/25/10; 05/2012; 03/23/16   mild asymmetric LVH, normal systolic function, normal diastolic fxn, mild-to-mod mitral regurg, mild aortic valve sclerosis  and trace AI, mild aortic root dilatation. 2014 f/u showed EF 40-45%, mod LAE, A FIB.  02/2016 EF 40%, diffuse hypokinesis, grade 2 DD.    Outpatient Medications Prior to Visit  Medication Sig Dispense Refill  . acetaminophen (TYLENOL) 325 MG tablet Take 2 tablets (650 mg total) by mouth every 6 (six) hours as needed for mild pain or moderate pain.    [name] colchicine 0.6 MG tablet Take 0.6 mg by mouth daily as needed (for gout).     . COMBIGAN 0.2-0.5 % ophthalmic solution Place 1 drop into both eyes 2 (two) times daily.     [name] ENTRESTO 97-103 MG TAKE 1 TABLET TWICE A DAY 180 tablet 1  . gabapentin (NEURONTIN) 300 MG capsule TAKE 1 CAPSULE THREE TIMES A DAY (Patient taking differently: Take 2 capsules in the AM and 1 capsule at bedtime.) 270 capsule 3  . GLIPIZIDE XL 5 MG 24 hr tablet TAKE 1 TABLET EVERY MORNING 90 tablet 1  . glucose blood test strip Use to check blood  sugars 1-2 times daily 100 each 3  . JANUVIA 100 MG tablet TAKE 1 TABLET DAILY 90 tablet 1  . latanoprost (XALATAN) 0.005 % ophthalmic solution Place 1 drop into both eyes at bedtime.   12  . meclizine (ANTIVERT) 25 MG tablet Take 1 tablet (25 mg total) 3 (three) times daily as needed by mouth for dizziness. 270 tablet 3  . metFORMIN (GLUCOPHAGE) 1000 MG tablet TAKE 1 TABLET TWICE A DAY 180 tablet 1  . metoprolol succinate (TOPROL-XL) 100 MG 24 hr tablet TAKE ONE AND ONE-HALF TABLETS DAILY 135 tablet 0  . omeprazole (PRILOSEC) 40 MG capsule TAKE 1 CAPSULE DAILY 90 capsule 1  . ONE TOUCH LANCETS MISC Use to check blood sugar 1-2 times daily 200 each 0  . rOPINIRole (REQUIP) 1 MG tablet Take 1 tablet (1 mg total) by mouth daily. 90 tablet 3  . sertraline (ZOLOFT) 100 MG tablet TAKE 1 TABLET DAILY 90 tablet 1  . simvastatin (ZOCOR) 20 MG tablet TAKE 1 TABLET EVERY EVENING 90 tablet 1  . XARELTO 20 MG TABS tablet TAKE 1 TABLET DAILY WITH SUPPER 90 tablet 1   No facility-administered medications prior to visit.     No Known  Allergies  ROS As per HPI  PE: Blood pressure 110/78, pulse 74, resp. rate 16, height 5\' 11"  (1.803 m), weight 230 lb (104.3 kg), SpO2 100 %. Gen: Alert, well appearing.  Patient is oriented to person, place, time, and situation. AFFECT: pleasant, lucid thought and speech. CV: RRR, no m/r/g.   LUNGS: CTA bilat, nonlabored resps, good aeration in all lung fields. EXT: no clubbing, cyanosis, or edema.    LABS:  Lab Results  Component Value Date   TSH 3.37 06/06/2016   Lab Results  Component Value Date   WBC 7.0 02/19/2017   HGB 13.5 02/19/2017   HCT 40.9 02/19/2017   MCV 90.2 02/19/2017   PLT 145.0 (L) 02/19/2017   Lab Results  Component Value Date   CREATININE 1.30 07/27/2017   BUN 18 07/27/2017   NA 139 07/27/2017   K 4.8 07/27/2017   CL 101 07/27/2017   CO2 31 07/27/2017   Lab Results  Component Value Date   ALT 14 07/27/2017   AST 18 07/27/2017   ALKPHOS 78 07/27/2017   BILITOT 0.5 07/27/2017   Lab Results  Component Value Date   CHOL 134 07/27/2017   Lab Results  Component Value Date   HDL 37.50 (L) 07/27/2017   Lab Results  Component Value Date   LDLCALC 68 07/27/2017   Lab Results  Component Value Date   TRIG 144.0 07/27/2017   Lab Results  Component Value Date   CHOLHDL 4 07/27/2017   Lab Results  Component Value Date   HGBA1C 8.2 (H) 07/27/2017    IMPRESSION AND PLAN:  1) DM 2, compliant with meds but not with home glucose monitoring. Diet is ok. HbA1c today. Exercise limited due to his CHF--NYHA class II/III.  2) HTN: well controlled on toprol and entresto. Lytes/cr today.  3) CRI stage III (GFR 50s): avoiding NSADIs, hydrating adequately. Lytes/cr today.  4) HLD: tolerating statin.  LDL 68 Jan 2019. Plan repeat on f/u in 3 mo.  An After Visit Summary was printed and given to the patient.  FOLLOW UP: Return in about 3 months (around 01/25/2018) for routine chronic illness f/u.  Signed:  Crissie Sickles, MD            10/26/2017

## 2017-10-30 LAB — CUP PACEART REMOTE DEVICE CHECK
Battery Remaining Longevity: 85 mo
Battery Voltage: 2.99 V
Brady Statistic AP VP Percent: 0 %
Brady Statistic AP VS Percent: 0 %
Brady Statistic AS VP Percent: 0 %
Brady Statistic AS VS Percent: 0 %
Brady Statistic RA Percent Paced: 0 %
Brady Statistic RV Percent Paced: 99.92 %
Date Time Interrogation Session: 20190402190808
HighPow Impedance: 72 Ohm
Implantable Lead Implant Date: 20130715
Implantable Lead Implant Date: 20180614
Implantable Lead Implant Date: 20180614
Implantable Lead Location: 753858
Implantable Lead Location: 753859
Implantable Lead Location: 753860
Implantable Lead Model: 4598
Implantable Pulse Generator Implant Date: 20180614
Lead Channel Impedance Value: 204.14 Ohm
Lead Channel Impedance Value: 212.8 Ohm
Lead Channel Impedance Value: 216.848
Lead Channel Impedance Value: 218.087
Lead Channel Impedance Value: 232.653
Lead Channel Impedance Value: 361 Ohm
Lead Channel Impedance Value: 399 Ohm
Lead Channel Impedance Value: 418 Ohm
Lead Channel Impedance Value: 456 Ohm
Lead Channel Impedance Value: 456 Ohm
Lead Channel Impedance Value: 475 Ohm
Lead Channel Impedance Value: 513 Ohm
Lead Channel Impedance Value: 532 Ohm
Lead Channel Impedance Value: 703 Ohm
Lead Channel Impedance Value: 722 Ohm
Lead Channel Impedance Value: 760 Ohm
Lead Channel Impedance Value: 760 Ohm
Lead Channel Impedance Value: 760 Ohm
Lead Channel Pacing Threshold Amplitude: 0.5 V
Lead Channel Pacing Threshold Amplitude: 0.5 V
Lead Channel Pacing Threshold Pulse Width: 0.4 ms
Lead Channel Pacing Threshold Pulse Width: 1 ms
Lead Channel Sensing Intrinsic Amplitude: 0.625 mV
Lead Channel Setting Pacing Amplitude: 1 V
Lead Channel Setting Pacing Amplitude: 2 V
Lead Channel Setting Pacing Pulse Width: 0.4 ms
Lead Channel Setting Pacing Pulse Width: 1 ms
Lead Channel Setting Sensing Sensitivity: 0.3 mV

## 2017-12-12 ENCOUNTER — Ambulatory Visit (INDEPENDENT_AMBULATORY_CARE_PROVIDER_SITE_OTHER): Payer: Medicare Other | Admitting: Family Medicine

## 2017-12-12 ENCOUNTER — Other Ambulatory Visit: Payer: Self-pay | Admitting: Cardiovascular Disease

## 2017-12-12 ENCOUNTER — Encounter: Payer: Self-pay | Admitting: Family Medicine

## 2017-12-12 VITALS — BP 111/72 | HR 78 | Temp 97.5°F | Resp 16 | Ht 71.0 in | Wt 227.4 lb

## 2017-12-12 DIAGNOSIS — M24542 Contracture, left hand: Secondary | ICD-10-CM

## 2017-12-12 DIAGNOSIS — L570 Actinic keratosis: Secondary | ICD-10-CM

## 2017-12-12 DIAGNOSIS — L989 Disorder of the skin and subcutaneous tissue, unspecified: Secondary | ICD-10-CM

## 2017-12-12 DIAGNOSIS — Z1283 Encounter for screening for malignant neoplasm of skin: Secondary | ICD-10-CM | POA: Diagnosis not present

## 2017-12-12 NOTE — Progress Notes (Signed)
OFFICE VISIT  12/12/2017   CC:  Chief Complaint  Patient presents with  . Skin Lesions     HPI:    Patient is a 77 y.o. Caucasian male who presents for skin complaints. Hx of AK's cryo'd by derm about 3 yrs ago. Notes lots more of dry bumps on R forearm over the last couple years. Particularly noticing one on R temple region fairly recent. No hx of melanoma.  Also, c/o few yr hx of left ring finger decreased flexion due to hx of injury to flexor tendon and subsequent surgery. It hurts him a lot of times plus impairs his ability to hold on the things many times. He is interested in seeing a hand specialist.  Past Medical History:  Diagnosis Date  . AICD (automatic cardioverter/defibrillator) present   . Arthritis    left shoulder (Dr. [name]): steroid injection 04/03/16.  Plan for TSA surgery 06/2016.  [name] BPH (benign prostatic hypertrophy) 06/2011   Irritative sx's; pt declined trial of anticholinergic per Urology records  . CHF (congestive heart failure) ([place])   . Chronic combined systolic and diastolic heart failure ([place]) 05/31/2012   Nonischemic:  EF 40-45%, LA mod-severe dilated, AFIB.   02/2016 EF 40%, diffuse hypokinesis, grade 2 DD.  Myoc perf imaging showed EF 32% 04/2016.  Pt upgraded to CRT-D 01/04/17.  [name] Chronic low back pain   . Chronic renal insufficiency, stage III (moderate) (HCC) 2015   CrCl about 60 ml/min  . Complete heart block (HCC)    Has dual chamber pacer.  . Depression   . DOE (dyspnea on exertion)    NYHA class II/III CHF  . Dyspnea    with exertion  . Episodic low back pain 01/22/2013   w/intermittent radiculitis (12/2014 his neurologist referred him to pain mgmt for epidural steroid injection)  . Erectile dysfunction 2019   due to zoloft--urol rx'd viagra  . GERD (gastroesophageal reflux disease)   . H/O tilt table evaluation 11/02/05   negative  . Helicobacter pylori gastritis 01/2016  . High cholesterol   . History of adenomatous polyp  of colon 10/12/11   Dr. [name] (3 right side of colon- tubular adenomas removed)  . History of cardiovascular stress test 05/28/12   no ischemia, EF 37%, imaging results are unchanged and within normal variance  . History of chronic prostatitis   . History of kidney stones   . History of vertigo    + Hx of posterior HA's.  Neuro (Dr. Erling Cruz) eval 2011.  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  . Hypertension   . Lumbar spondylosis    lumbosacral radiculopathy at L4 by EMG testing, right foot drop (neurologist is Dr. Linus Salmons with [facility] in [place])--neurologist referred him to neurosurgery  . Migraine    "used to have them all the time; none for years" (01/04/2017)  . Myocardial infarction [facility] ?1970s  . Nephrolithiasis 07/2012   Left UVJ 2 mm stone with dilation of renal collecting system and slight hydroureter on right  . Neuropathy   . 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  Since no CAD on cath 05/2016, cards recommends upgrade to CRT-D.  [name] Permanent atrial fibrillation ([place])    DCCV 07/09/13-converted, lasted two days, then back into afib--needs lifetime anticoagulation (Xarelto as of 09/2014)  . Prostate cancer screening 09/2017   done by urol annually (normal prostate exam documented + PSA 0.84  as of 10/01/17 urol f/u.  [name] Rectus diastasis   . Right ankle sprain 08/2017   w/distal fibula avulsion fx noted on u/s but not plain film-(Dr. Hudnall).  . Skin cancer of arm, left    "burned it off" (01/04/2017)  . Type II diabetes mellitus ([place])     Past Surgical History:  Procedure Laterality Date  . BACK SURGERY    . BI-VENTRICULAR IMPLANTABLE CARDIOVERTER DEFIBRILLATOR  (CRT-D) Left 01/04/2017  . BIV ICD INSERTION CRT-D N/A 01/04/2017   Procedure: BiV ICD ;  Surgeon: Constance Haw, MD;  Location: [place] [facility];  Service:  Cardiovascular;  Laterality: N/A;  . CARDIAC CATHETERIZATION N/A 06/14/2016   Minimal nonobstructive dz, EF 25-35%.  Procedure: Left Heart Cath and Coronary Angiography;  Surgeon: Peter M Martinique, MD;  Location: [place] [facility];  Service: Cardiovascular;  Laterality: N/A;  . CARDIOVASCULAR STRESS TEST  2012   2012 nuclear perfusion study: low risk scan; 04/2016 normal myocardial perfusion imaging, EF 32%.  [name] CARDIOVERSION  07/09/2012   Procedure: CARDIOVERSION;  Surgeon: [name], MD;  Location: [place];  Service: Cardiovascular;  Laterality: N/A;  . CATARACT EXTRACTION W/ INTRAOCULAR LENS IMPLANT & ANTERIOR VITRECTOMY, BILATERAL Bilateral   . COLONOSCOPY W/ POLYPECTOMY  approx 2006; repeated 09/2011   Polyps on 2013 EGD as well, repeat 12/2014  . ESOPHAGOGASTRODUODENOSCOPY  10/18/06   Done due to chronic GERD: Normal, bx showed no barrett's esophagus (Dr. [name])  . FLEXOR TENDON REPAIR Left 10/02/2016   Procedure: LEFT RING FINGER WOUND EXPORATION AND FLEXOR TENDON REPAIR AND NERVE REPAIR;  Surgeon: Milly Jakob, MD;  Location: [place];  Service: Orthopedics;  Laterality: Left;  . INSERT / REPLACE / REMOVE PACEMAKER  02/05/2012   dual chamber, sinus node dysfunction, sinus arrest, PAF, Medtronic Revo serial#-[id number] H: last checked 05/2015  . LUMBAR LAMINECTOMY Left 1976   L4-5  . PACEMAKER REMOVAL  01/04/2017  . PERMANENT PACEMAKER INSERTION N/A 02/05/2012   Procedure: PERMANENT PACEMAKER INSERTION;  Surgeon: [name], MD; Generator Medtronic Revo model [place] serial number [id number] H Laterality: N/A;  . RETINAL DETACHMENT SURGERY Left ~ 1999  . REVERSE SHOULDER ARTHROPLASTY Left 2018   Left shoulder reverse [name] [place]  . TRANSTHORACIC ECHOCARDIOGRAM  08/25/10; 05/2012; 03/23/16   mild asymmetric LVH, normal systolic function, normal diastolic fxn, mild-to-mod mitral regurg, mild aortic valve sclerosis and trace AI, mild aortic root dilatation. 2014 f/u  showed EF 40-45%, mod LAE, A FIB.  02/2016 EF 40%, diffuse hypokinesis, grade 2 DD.    Outpatient Medications Prior to Visit  Medication Sig Dispense Refill  . acetaminophen (TYLENOL) 325 MG tablet Take 2 tablets (650 mg total) by mouth every 6 (six) hours as needed for mild pain or moderate pain.    [name] colchicine 0.6 MG tablet Take 0.6 mg by mouth daily as needed (for gout).     . COMBIGAN 0.2-0.5 % ophthalmic solution Place 1 drop into both eyes 2 (two) times daily.     [name] ENTRESTO 97-103 MG TAKE 1 TABLET TWICE A DAY 180 tablet 1  . gabapentin (NEURONTIN) 300 MG capsule TAKE 1 CAPSULE THREE TIMES A DAY (Patient taking differently: Take 2 capsules in the AM and 1 capsule at bedtime.) 270 capsule 3  . GLIPIZIDE XL 5 MG 24 hr tablet TAKE 1 TABLET EVERY MORNING 90 tablet 1  . glucose blood test strip Use to check blood sugars 1-2 times daily 100 each 3  . JANUVIA 100 MG  tablet TAKE 1 TABLET DAILY 90 tablet 1  . latanoprost (XALATAN) 0.005 % ophthalmic solution Place 1 drop into both eyes at bedtime.   12  . meclizine (ANTIVERT) 25 MG tablet Take 1 tablet (25 mg total) 3 (three) times daily as needed by mouth for dizziness. 270 tablet 3  . metFORMIN (GLUCOPHAGE) 1000 MG tablet TAKE 1 TABLET TWICE A DAY 180 tablet 1  . metoprolol succinate (TOPROL-XL) 100 MG 24 hr tablet TAKE ONE AND ONE-HALF TABLETS DAILY 135 tablet 0  . omeprazole (PRILOSEC) 40 MG capsule TAKE 1 CAPSULE DAILY 90 capsule 1  . ONE TOUCH LANCETS MISC Use to check blood sugar 1-2 times daily 200 each 0  . rOPINIRole (REQUIP) 1 MG tablet Take 1 tablet (1 mg total) by mouth daily. 90 tablet 3  . sertraline (ZOLOFT) 100 MG tablet TAKE 1 TABLET DAILY 90 tablet 1  . simvastatin (ZOCOR) 20 MG tablet TAKE 1 TABLET EVERY EVENING 90 tablet 1  . XARELTO 20 MG TABS tablet TAKE 1 TABLET DAILY WITH SUPPER 90 tablet 1   No facility-administered medications prior to visit.     No Known Allergies  ROS As per HPI  PE: Blood pressure 111/72,  pulse 78, temperature (!) 97.5 F (36.4 C), temperature source Oral, resp. rate 16, height 5\' 11"  (1.803 m), weight 227 lb 6 oz (103.1 kg), SpO2 99 %. Gen: Alert, well appearing.  Patient is oriented to person, place, time, and situation. AFFECT: pleasant, lucid thought and speech. SKIN: R temple with light brown nevus about 2-3 mm size, normal borders. R forearm with 5-6 flaky papules, small, flesh colored-to-pink color. L ring finger with slight flexion contracture, no triggering of joint.  Cannot fully extend finger manually.  No swelling or erythema or tenderness.  LABS:  None today.  IMPRESSION AND PLAN:  1) Multiple AK lesions R forearm: pt interested in further eval and possible cryo of these---referred to dermatologist today.  2) Left hand ring finger flexion contracture s/p remote injury and surgery: referred pt to Dr. Amedeo Plenty today.  An After Visit Summary was printed and given to the patient.  FOLLOW UP: Return for keep appt set for 02/01/18.  Signed:  Crissie Sickles, MD           12/12/2017

## 2017-12-12 NOTE — Telephone Encounter (Signed)
REFILL 

## 2017-12-19 ENCOUNTER — Other Ambulatory Visit: Payer: Self-pay | Admitting: Family Medicine

## 2017-12-20 ENCOUNTER — Telehealth: Payer: Self-pay | Admitting: Cardiovascular Disease

## 2017-12-20 NOTE — Telephone Encounter (Signed)
Pt states that he is needing a letter stating that he had a heart condition prior to his first visit and the reason he was referred for his [facility] claim.   Routed to Dr. Loletha Grayer

## 2017-12-20 NOTE — Telephone Encounter (Signed)
New Message    Patient is calling in reference to an appointment that he had with Dr. Sallyanne Kuster. He states that he was advised that he had some scar tissue on his heart. But it was never documented. He advises that he needs this information to substantiate a claim for the [place]. Please call. Patient can be reached at [phone] or [phone].

## 2018-01-07 DIAGNOSIS — L570 Actinic keratosis: Secondary | ICD-10-CM | POA: Diagnosis not present

## 2018-01-07 DIAGNOSIS — D225 Melanocytic nevi of trunk: Secondary | ICD-10-CM | POA: Diagnosis not present

## 2018-01-07 DIAGNOSIS — L821 Other seborrheic keratosis: Secondary | ICD-10-CM | POA: Diagnosis not present

## 2018-01-07 DIAGNOSIS — L820 Inflamed seborrheic keratosis: Secondary | ICD-10-CM | POA: Diagnosis not present

## 2018-01-10 ENCOUNTER — Ambulatory Visit (INDEPENDENT_AMBULATORY_CARE_PROVIDER_SITE_OTHER): Payer: Medicare Other | Admitting: Physician Assistant

## 2018-01-10 ENCOUNTER — Encounter: Payer: Self-pay | Admitting: Cardiology

## 2018-01-10 ENCOUNTER — Encounter: Payer: Self-pay | Admitting: Physician Assistant

## 2018-01-10 VITALS — BP 129/83 | HR 87 | Ht 71.0 in | Wt 231.2 lb

## 2018-01-10 DIAGNOSIS — R0600 Dyspnea, unspecified: Secondary | ICD-10-CM

## 2018-01-10 DIAGNOSIS — I442 Atrioventricular block, complete: Secondary | ICD-10-CM | POA: Diagnosis not present

## 2018-01-10 DIAGNOSIS — I428 Other cardiomyopathies: Secondary | ICD-10-CM

## 2018-01-10 DIAGNOSIS — R0609 Other forms of dyspnea: Secondary | ICD-10-CM | POA: Diagnosis not present

## 2018-01-10 DIAGNOSIS — I5042 Chronic combined systolic (congestive) and diastolic (congestive) heart failure: Secondary | ICD-10-CM | POA: Diagnosis not present

## 2018-01-10 NOTE — Progress Notes (Signed)
Cardiology Office Note   Date:  01/10/2018   ID:  Mason [name], DOB 04-24-1941, MRN [id number]  PCP:  Tammi Sou, MD  Cardiologist:  Dr Sallyanne Kuster, 07/18/2017 Electrophysiologist: Dr Curt Bears  Rosaria Ferries, PA-C   Chief Complaint  Patient presents with  . Follow-up    History of Present Illness: Mason [name] is a 77 y.o. male with a history of perm A fib, NICM, S-D-CHF, CKD III, CHB s/p MDT CRT-D, GERD, DM, HTN, HLD, BPH, OA   Mason [name] presents for cardiology follow up.  He has significant DOE. He cannot walk any significant distance without getting sob. He cannot lift anything and carry it any distance. When he bends over, his breath is cut off, he feels SOB when he stand up.   He does not get chest pain.   He denies LE edema, orthopnea, rare PND, 1-2 x month. He snores but has never been told he quits breathing.   He does not weigh daily.    Does not eat out much, no sig amt fast food. Does not use salt.    Past Medical History:  Diagnosis Date  . AICD (automatic cardioverter/defibrillator) present 2018   MDT CRT-D  . Arthritis    left shoulder (Dr. [name]): steroid injection 04/03/16.  Plan for TSA surgery 06/2016.  [name] BPH (benign prostatic hypertrophy) 06/2011   Irritative sx's; pt declined trial of anticholinergic per Urology records  . Chronic combined systolic and diastolic heart failure ([place]) 05/31/2012   Nonischemic:  EF 40-45%, LA mod-severe dilated, AFIB.   02/2016 EF 40%, diffuse hypokinesis, grade 2 DD.  Myoc perf imaging showed EF 32% 04/2016.  Pt upgraded to CRT-D 01/04/17.  [name] Chronic renal insufficiency, stage III (moderate) (HCC) 2015   CrCl about 60 ml/min  . Complete heart block (HCC)    Has dual chamber pacer.  . Depression   . DOE (dyspnea on exertion)    NYHA class II/III CHF  . Episodic low back pain 01/22/2013   w/intermittent radiculitis (12/2014 his neurologist referred him to pain mgmt for epidural steroid injection)  .  Erectile dysfunction 2019   due to zoloft--urol rx'd viagra  . GERD (gastroesophageal reflux disease)   . H/O tilt table evaluation 11/02/05   negative  . Helicobacter pylori gastritis 01/2016  . High cholesterol   . History of adenomatous polyp of colon 10/12/11   Dr. [name] (3 right side of colon- tubular adenomas removed)  . History of cardiovascular stress test 05/28/12   no ischemia, EF 37%, imaging results are unchanged and within normal variance  . History of chronic prostatitis   . History of kidney stones   . History of vertigo    + Hx of posterior HA's.  Neuro (Dr. Erling Cruz) eval 2011.  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  . Hypertension   . Lumbar spondylosis    lumbosacral radiculopathy at L4 by EMG testing, right foot drop (neurologist is Dr. Linus Salmons with [facility] in [place])--neurologist referred him to neurosurgery  . Migraine    "used to have them all the time; none for years" (01/04/2017)  . Myocardial infarction [facility]) ?1970s  . Nephrolithiasis 07/2012   Left UVJ 2 mm stone with dilation of renal collecting system and slight hydroureter on right  . Neuropathy   . 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  Since no CAD on  cath 05/2016, cards recommends upgrade to CRT-D.  [name] Permanent atrial fibrillation ([place])    DCCV 07/09/13-converted, lasted two days, then back into afib--needs lifetime anticoagulation (Xarelto as of 09/2014)  . Prostate cancer screening 09/2017   done by urol annually (normal prostate exam documented + PSA 0.84 as of 10/01/17 urol f/u.  [name] Rectus diastasis   . Right ankle sprain 08/2017   w/distal fibula avulsion fx noted on u/s but not plain film-(Dr. Hudnall).  . Skin cancer of arm, left    "burned it off" (01/04/2017)  . Type II diabetes mellitus ([place])     Past Surgical History:  Procedure Laterality Date   . BACK SURGERY    . BI-VENTRICULAR IMPLANTABLE CARDIOVERTER DEFIBRILLATOR  (CRT-D) Left 01/04/2017  . BIV ICD INSERTION CRT-D N/A 01/04/2017   Procedure: BiV ICD ;  Surgeon: Constance Haw, MD;  Location: [place] [facility];  Service: Cardiovascular;  Laterality: N/A;  . CARDIAC CATHETERIZATION N/A 06/14/2016   Minimal nonobstructive dz, EF 25-35%.  Procedure: Left Heart Cath and Coronary Angiography;  Surgeon: Peter M Martinique, MD;  Location: [place] [facility];  Service: Cardiovascular;  Laterality: N/A;  . CARDIOVASCULAR STRESS TEST  2012   2012 nuclear perfusion study: low risk scan; 04/2016 normal myocardial perfusion imaging, EF 32%.  [name] CARDIOVERSION  07/09/2012   Procedure: CARDIOVERSION;  Surgeon: [name], MD;  Location: [place];  Service: Cardiovascular;  Laterality: N/A;  . CATARACT EXTRACTION W/ INTRAOCULAR LENS IMPLANT & ANTERIOR VITRECTOMY, BILATERAL Bilateral   . COLONOSCOPY W/ POLYPECTOMY  approx 2006; repeated 09/2011   Polyps on 2013 EGD as well, repeat 12/2014  . ESOPHAGOGASTRODUODENOSCOPY  10/18/06   Done due to chronic GERD: Normal, bx showed no barrett's esophagus (Dr. [name])  . FLEXOR TENDON REPAIR Left 10/02/2016   Procedure: LEFT RING FINGER WOUND EXPORATION AND FLEXOR TENDON REPAIR AND NERVE REPAIR;  Surgeon: Milly Jakob, MD;  Location: [place];  Service: Orthopedics;  Laterality: Left;  . INSERT / REPLACE / REMOVE PACEMAKER  02/05/2012   dual chamber, sinus node dysfunction, sinus arrest, PAF, Medtronic Revo serial#[id number] H: last checked 05/2015  . LUMBAR LAMINECTOMY Left 1976   L4-5  . PACEMAKER REMOVAL  01/04/2017  . PERMANENT PACEMAKER INSERTION N/A 02/05/2012   Procedure: PERMANENT PACEMAKER INSERTION;  Surgeon: [name], MD; Generator Medtronic Revo model [place] serial number [id number] H Laterality: N/A;  . RETINAL DETACHMENT SURGERY Left ~ 1999  . REVERSE SHOULDER ARTHROPLASTY Left 2018   Left shoulder reverse TSA [name] in  [place]  . TRANSTHORACIC ECHOCARDIOGRAM  08/25/10; 05/2012; 03/23/16   mild asymmetric LVH, normal systolic function, normal diastolic fxn, mild-to-mod mitral regurg, mild aortic valve sclerosis and trace AI, mild aortic root dilatation. 2014 f/u showed EF 40-45%, mod LAE, A FIB.  02/2016 EF 40%, diffuse hypokinesis, grade 2 DD.    Current Outpatient Medications  Medication Sig Dispense Refill  . acetaminophen (TYLENOL) 325 MG tablet Take 2 tablets (650 mg total) by mouth every 6 (six) hours as needed for mild pain or moderate pain.    [name] colchicine 0.6 MG tablet Take 0.6 mg by mouth daily as needed (for gout).     . COMBIGAN 0.2-0.5 % ophthalmic solution Place 1 drop into both eyes 2 (two) times daily.     [name] ENTRESTO 97-103 MG TAKE 1 TABLET TWICE A DAY 180 tablet 1  . gabapentin (NEURONTIN) 300 MG capsule TAKE 1 CAPSULE THREE TIMES A DAY (Patient taking differently: Take 2 capsules in the  AM and 1 capsule at bedtime.) 270 capsule 3  . GLIPIZIDE XL 5 MG 24 hr tablet TAKE 1 TABLET EVERY MORNING 90 tablet 1  . glucose blood test strip Use to check blood sugars 1-2 times daily 100 each 3  . JANUVIA 100 MG tablet TAKE 1 TABLET DAILY 90 tablet 1  . latanoprost (XALATAN) 0.005 % ophthalmic solution Place 1 drop into both eyes at bedtime.   12  . meclizine (ANTIVERT) 25 MG tablet Take 1 tablet (25 mg total) 3 (three) times daily as needed by mouth for dizziness. 270 tablet 3  . metFORMIN (GLUCOPHAGE) 1000 MG tablet TAKE 1 TABLET TWICE A DAY 180 tablet 1  . metoprolol succinate (TOPROL-XL) 100 MG 24 hr tablet TAKE ONE AND ONE-HALF TABLETS DAILY 135 tablet 0  . omeprazole (PRILOSEC) 40 MG capsule TAKE 1 CAPSULE DAILY 90 capsule 1  . ONE TOUCH LANCETS MISC Use to check blood sugar 1-2 times daily 200 each 0  . rOPINIRole (REQUIP) 1 MG tablet Take 1 tablet (1 mg total) by mouth daily. 90 tablet 3  . sertraline (ZOLOFT) 100 MG tablet TAKE 1 TABLET DAILY 90 tablet 1  . simvastatin (ZOCOR) 20 MG tablet TAKE 1  TABLET EVERY EVENING 90 tablet 1  . XARELTO 20 MG TABS tablet TAKE 1 TABLET DAILY WITH SUPPER 90 tablet 1   No current facility-administered medications for this visit.     Allergies:   Patient has no known allergies.    Social History:  The patient  reports that he has never smoked. He has never used smokeless tobacco. He reports that he drinks alcohol. He reports that he does not use drugs.   Family History:  The patient's family history includes Heart failure in his mother; Stroke in his father and mother.    ROS:  Please see the history of present illness. All other systems are reviewed and negative.    PHYSICAL EXAM: VS:  BP 129/83   Pulse 87   Ht 5\' 11"  (1.803 m)   Wt 231 lb 3.2 oz (104.9 kg)   BMI 32.25 kg/m  , BMI Body mass index is 32.25 kg/m. GEN: Well nourished, well developed, male in no acute distress  HEENT: normal for age  Neck: no JVD, no carotid bruit, no masses Cardiac: RRR; soft murmur, no rubs, or gallops Respiratory:  clear to auscultation bilaterally, normal work of breathing GI: soft, nontender, nondistended, + BS MS: no deformity or atrophy; no edema; distal pulses are 2+ in all 4 extremities   Skin: warm and dry, no rash Neuro:  Strength and sensation are intact Psych: euthymic mood, full affect   EKG:  EKG is not ordered today.  CARDIAC CATH: 11/22/017  There is severe left ventricular systolic dysfunction.  The left ventricular ejection fraction is 25-35% by visual estimate.  LV end diastolic pressure is normal.  1. 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 Severe LV dysfunction 3. Normal LV EDP Plan: medical management.    Recent Labs: 02/19/2017: Hemoglobin 13.5; Platelets 145.0 07/27/2017: ALT 14 10/26/2017: BUN 21; Creatinine, Ser 1.38; Potassium 4.9; Sodium 137    Lipid Panel    Component Value Date/Time   CHOL 134 07/27/2017 1158   TRIG 144.0 07/27/2017 1158   HDL 37.50 (L) 07/27/2017 1158   CHOLHDL 4 07/27/2017 1158   VLDL  28.8 07/27/2017 1158   LDLCALC 68 07/27/2017 1158     Wt Readings from Last 3 Encounters:  01/10/18 231 lb 3.2 oz (  104.9 kg)  12/12/17 227 lb 6 oz (103.1 kg)  10/26/17 230 lb (104.3 kg)     Other studies Reviewed: Additional studies/ records that were reviewed today include: Office notes, hospital records and testing.  ASSESSMENT AND PLAN:  1.  Dyspnea on exertion: He has no volume overload by exam.  His weight is stable.  Check an echocardiogram, but I am not convinced this is related to heart failure.  I reviewed the case with Dr. Sallyanne Kuster, his symptoms may be related to his device.  On review of his thoracic impedance, he is not overloaded.  2.  Chronic combined systolic and diastolic CHF/nonischemic cardiomyopathy: Continue current therapy  3.  History of complete heart block with Medtronic CRT-D: He is pacer dependent.  He is by V pacing 100% of the time.  His device was interrogated and Dr. Sallyanne Kuster was able to review the images.  Based on these, he likely needs better rate response.  He was due to see Dr. Curt Bears in September, we will set up an appointment in the device clinic with Dr. Curt Bears.   Current medicines are reviewed at length with the patient today.  The patient does not have concerns regarding medicines.  The following changes have been made:  no change  Labs/ tests ordered today include:  No orders of the defined types were placed in this encounter.    Disposition:   FU with Dr. Sallyanne Kuster and Dr. Curt Bears  Signed, Rosaria Ferries, PA-C  01/10/2018 3:12 PM    [place] Phone: [phone]; Fax: [phone]  This note was written with the assistance of speech recognition software. Please excuse any transcriptional errors.

## 2018-01-10 NOTE — Patient Instructions (Addendum)
Medication Instructions: Your physician recommends that you continue on your current medications as directed.    If you need a refill on your cardiac medications before your next appointment, please call your pharmacy.    Procedures/Testing: Your physician has requested that you have an echocardiogram. Echocardiography is a painless test that uses sound waves to create images of your heart. It provides your doctor with information about the size and shape of your heart and how well your heart's chambers and valves are working. This procedure takes approximately one hour. There are no restrictions for this procedure. [place] [address]   Follow-Up: Your physician wants you to follow-up first available with Dr. Sallyanne Kuster   Your physician recommends that you schedule a follow-up first available Dr. Curt Bears and a device programming change.    Special Instructions: Daily weight    Thank you for choosing [facility] at [facility]!!

## 2018-01-13 ENCOUNTER — Encounter: Payer: Self-pay | Admitting: Family Medicine

## 2018-01-16 ENCOUNTER — Other Ambulatory Visit: Payer: Self-pay

## 2018-01-16 ENCOUNTER — Ambulatory Visit (HOSPITAL_COMMUNITY): Payer: Medicare Other | Attending: Cardiology

## 2018-01-16 DIAGNOSIS — I509 Heart failure, unspecified: Secondary | ICD-10-CM | POA: Insufficient documentation

## 2018-01-16 DIAGNOSIS — E119 Type 2 diabetes mellitus without complications: Secondary | ICD-10-CM | POA: Diagnosis not present

## 2018-01-16 DIAGNOSIS — I4891 Unspecified atrial fibrillation: Secondary | ICD-10-CM | POA: Insufficient documentation

## 2018-01-16 DIAGNOSIS — E785 Hyperlipidemia, unspecified: Secondary | ICD-10-CM | POA: Insufficient documentation

## 2018-01-16 DIAGNOSIS — I428 Other cardiomyopathies: Secondary | ICD-10-CM | POA: Insufficient documentation

## 2018-01-16 DIAGNOSIS — I110 Hypertensive heart disease with heart failure: Secondary | ICD-10-CM | POA: Diagnosis not present

## 2018-01-16 DIAGNOSIS — I77810 Thoracic aortic ectasia: Secondary | ICD-10-CM | POA: Insufficient documentation

## 2018-01-16 DIAGNOSIS — I081 Rheumatic disorders of both mitral and tricuspid valves: Secondary | ICD-10-CM | POA: Insufficient documentation

## 2018-01-18 ENCOUNTER — Ambulatory Visit (INDEPENDENT_AMBULATORY_CARE_PROVIDER_SITE_OTHER): Payer: Medicare Other | Admitting: Cardiovascular Disease

## 2018-01-18 ENCOUNTER — Encounter: Payer: Self-pay | Admitting: Cardiovascular Disease

## 2018-01-18 VITALS — BP 122/81 | HR 82 | Ht 71.0 in | Wt 230.0 lb

## 2018-01-18 DIAGNOSIS — E1169 Type 2 diabetes mellitus with other specified complication: Secondary | ICD-10-CM

## 2018-01-18 DIAGNOSIS — Z95810 Presence of automatic (implantable) cardiac defibrillator: Secondary | ICD-10-CM | POA: Diagnosis not present

## 2018-01-18 DIAGNOSIS — I10 Essential (primary) hypertension: Secondary | ICD-10-CM | POA: Diagnosis not present

## 2018-01-18 DIAGNOSIS — E785 Hyperlipidemia, unspecified: Secondary | ICD-10-CM | POA: Diagnosis not present

## 2018-01-18 DIAGNOSIS — E669 Obesity, unspecified: Secondary | ICD-10-CM

## 2018-01-18 DIAGNOSIS — I482 Chronic atrial fibrillation: Secondary | ICD-10-CM

## 2018-01-18 DIAGNOSIS — I5022 Chronic systolic (congestive) heart failure: Secondary | ICD-10-CM | POA: Diagnosis not present

## 2018-01-18 DIAGNOSIS — I4821 Permanent atrial fibrillation: Secondary | ICD-10-CM

## 2018-01-18 DIAGNOSIS — I4430 Unspecified atrioventricular block: Secondary | ICD-10-CM | POA: Diagnosis not present

## 2018-01-18 NOTE — Progress Notes (Signed)
Cardiology Office Note    Date:  01/18/2018   ID:  BEJAMIN HACKBART, DOB 11-Mar-1941, MRN [id number]  PCP:  Tammi Sou, MD  Cardiologist:   [name], MD   Chief Complaint  Patient presents with  . Follow-up    chf  . Pacemaker Problem    History of Present Illness:  Mason [name] is a 77 y.o. male with nonischemic cardiomyopathy, permanent atrial fibrillation and complete heart block who underwent implantation of a CRT-D in June 2018 for deteriorating left ventricular systolic function.  He is pacemaker dependent.  Although improved following his CRT upgrade, he still complains of fatigue and shortness of breath, sometimes a relatively small activities such as bending over to put the leash on his dog.  Eventually, with light activity he gained some energy back and is able to keep him going.  He develops fatigue if he tries to do hard work.  He is not taking diuretics.  He never has edema and denies orthopnea or PND.  He is on maximum of Entresto and a hefty dose of metoprolol. Optivol does not support a diagnosis of volume overload.  He has 100% biventricular pacing.  Activity remains fairly constant at 2-3 hours a day.  He saw Rosaria Ferries in the clinic with complaints of fatigue and exertional intolerance and at that time interrogation of his device showed rather blunted heart rate histograms.  Unfortunately we were not able to do any reprogramming during that visit.  Today, I increased the sensor settings for both ADL and exercise to level 4.  Response level is medium-low.  The patient specifically denies any chest pain at rest or exertion, dyspnea at rest, orthopnea, paroxysmal nocturnal dyspnea, syncope, palpitations, focal neurological deficits, intermittent claudication, lower extremity edema, unexplained weight gain, cough, hemoptysis or wheezing.  He does not have coronary or other vascular disorders, but has numerous risk factors including type 2 diabetes,  hyperlipidemia, mild obesity. He has a history of gout.  The cause of Mason [name]'s cardiomyopathy remains uncertain.  He initially presented with a "heart attack" and was in the ICU in the [facility] [place] for 3 to 4 days in 1976.  He has had a depressed left ventricular systolic function for many years.  Cardiac catheterization most recently performed in 2017 does show nonobstructive atherosclerosis, but no meaningful stenotic lesions that require revascularization.  He has a long career including 2 stints in [place] and he knows that he was exposed to [employer].   Past Medical History:  Diagnosis Date  . AICD (automatic cardioverter/defibrillator) present 2018   MDT CRT-D  . Arthritis    left shoulder (Dr. [name]): steroid injection 04/03/16.  Plan for TSA surgery 06/2016.  [name] BPH (benign prostatic hypertrophy) 06/2011   Irritative sx's; pt declined trial of anticholinergic per Urology records  . Chronic combined systolic and diastolic heart failure ([place]) 05/31/2012   Nonischemic:  EF 40-45%, LA mod-severe dilated, AFIB.   02/2016 EF 40%, diffuse hypokinesis, grade 2 DD.  Myoc perf imaging showed EF 32% 04/2016.  Pt upgraded to CRT-D 01/04/17.  [name] Chronic renal insufficiency, stage III (moderate) (HCC) 2015   CrCl about 60 ml/min  . Complete heart block (HCC)    Has dual chamber pacer.  . Depression   . DOE (dyspnea on exertion)    NYHA class II/III CHF  . Episodic low back pain 01/22/2013   w/intermittent radiculitis (12/2014 his neurologist referred him to pain mgmt for epidural steroid injection)  .  Erectile dysfunction 2019   due to zoloft--urol rx'd viagra  . GERD (gastroesophageal reflux disease)   . H/O tilt table evaluation 11/02/05   negative  . Helicobacter pylori gastritis 01/2016  . High cholesterol   . History of adenomatous polyp of colon 10/12/11   Dr. [name] (3 right side of colon- tubular adenomas removed)  . History of cardiovascular stress test 05/28/12   no  ischemia, EF 37%, imaging results are unchanged and within normal variance  . History of chronic prostatitis   . History of kidney stones   . History of vertigo    + Hx of posterior HA's.  Neuro (Dr. Erling Cruz) eval 2011.  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  . Hypertension   . Lumbar spondylosis    lumbosacral radiculopathy at L4 by EMG testing, right foot drop (neurologist is Dr. Linus Salmons with [facility] in [place])--neurologist referred him to neurosurgery  . Migraine    "used to have them all the time; none for years" (01/04/2017)  . Myocardial infarction [facility]) ?1970s  . Nephrolithiasis 07/2012   Left UVJ 2 mm stone with dilation of renal collecting system and slight hydroureter on right  . Neuropathy   . 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  Since no CAD on cath 05/2016, cards recommends upgrade to CRT-D.  [name] Permanent atrial fibrillation ([place])    DCCV 07/09/13-converted, lasted two days, then back into afib--needs lifetime anticoagulation (Xarelto as of 09/2014)  . Prostate cancer screening 09/2017   done by urol annually (normal prostate exam documented + PSA 0.84 as of 10/01/17 urol f/u.  [name] Rectus diastasis   . Right ankle sprain 08/2017   w/distal fibula avulsion fx noted on u/s but not plain film-(Dr. Hudnall).  . Skin cancer of arm, left    "burned it off" (01/04/2017)  . Type II diabetes mellitus ([place])     Past Surgical History:  Procedure Laterality Date  . BACK SURGERY    . BI-VENTRICULAR IMPLANTABLE CARDIOVERTER DEFIBRILLATOR  (CRT-D) Left 01/04/2017  . BIV ICD INSERTION CRT-D N/A 01/04/2017   Procedure: BiV ICD ;  Surgeon: Constance Haw, MD;  Location: [place] [facility];  Service: Cardiovascular;  Laterality: N/A;  . CARDIAC CATHETERIZATION N/A 06/14/2016   Minimal nonobstructive dz, EF 25-35%.  Procedure: Left Heart  Cath and Coronary Angiography;  Surgeon: Peter M Martinique, MD;  Location: [facility];  Service: Cardiovascular;  Laterality: N/A;  . CARDIOVASCULAR STRESS TEST  2012   2012 nuclear perfusion study: low risk scan; 04/2016 normal myocardial perfusion imaging, EF 32%.  [name] CARDIOVERSION  07/09/2012   Procedure: CARDIOVERSION;  Surgeon: [name], MD;  Location: [facility];  Service: Cardiovascular;  Laterality: N/A;  . CATARACT EXTRACTION W/ INTRAOCULAR LENS IMPLANT & ANTERIOR VITRECTOMY, BILATERAL Bilateral   . COLONOSCOPY W/ POLYPECTOMY  approx 2006; repeated 09/2011   Polyps on 2013 EGD as well, repeat 12/2014  . ESOPHAGOGASTRODUODENOSCOPY  10/18/06   Done due to chronic GERD: Normal, bx showed no barrett's esophagus (Dr. [name])  . FLEXOR TENDON REPAIR Left 10/02/2016   Procedure: LEFT RING FINGER WOUND EXPORATION AND FLEXOR TENDON REPAIR AND NERVE REPAIR;  Surgeon: Milly Jakob, MD;  Location: [facility];  Service: Orthopedics;  Laterality: Left;  . INSERT / REPLACE / REMOVE PACEMAKER  02/05/2012   dual chamber, sinus node dysfunction, sinus arrest, PAF, Medtronic Revo serial#-[id number]: last checked 05/2015  .  LUMBAR LAMINECTOMY Left 1976   L4-5  . PACEMAKER REMOVAL  01/04/2017  . PERMANENT PACEMAKER INSERTION N/A 02/05/2012   Procedure: PERMANENT PACEMAKER INSERTION;  Surgeon: [name], MD; Generator Medtronic Revo model [place] serial number [id number] H Laterality: N/A;  . RETINAL DETACHMENT SURGERY Left ~ 1999  . REVERSE SHOULDER ARTHROPLASTY Left 2018   Left shoulder reverse [facility]SA [name] in [place]).  . TRANSTHORACIC ECHOCARDIOGRAM  08/25/10; 05/2012; 03/23/16   mild asymmetric LVH, normal systolic function, normal diastolic fxn, mild-to-mod mitral regurg, mild aortic valve sclerosis and trace AI, mild aortic root dilatation. 2014 f/u showed EF 40-45%, mod LAE, A FIB.  02/2016 EF 40%, diffuse hypokinesis, grade 2 DD.    Current Medications: Outpatient Medications Prior to  Visit  Medication Sig Dispense Refill  . acetaminophen (TYLENOL) 325 MG tablet Take 2 tablets (650 mg total) by mouth every 6 (six) hours as needed for mild pain or moderate pain.    [name] colchicine 0.6 MG tablet Take 0.6 mg by mouth daily as needed (for gout).     . COMBIGAN 0.2-0.5 % ophthalmic solution Place 1 drop into both eyes 2 (two) times daily.     [name] ENTRESTO 97-103 MG TAKE 1 TABLET TWICE A DAY 180 tablet 1  . gabapentin (NEURONTIN) 300 MG capsule TAKE 1 CAPSULE THREE TIMES A DAY (Patient taking differently: Take 2 capsules in the AM and 1 capsule at bedtime.) 270 capsule 3  . GLIPIZIDE XL 5 MG 24 hr tablet TAKE 1 TABLET EVERY MORNING 90 tablet 1  . glucose blood test strip Use to check blood sugars 1-2 times daily 100 each 3  . JANUVIA 100 MG tablet TAKE 1 TABLET DAILY 90 tablet 1  . latanoprost (XALATAN) 0.005 % ophthalmic solution Place 1 drop into both eyes at bedtime.   12  . meclizine (ANTIVERT) 25 MG tablet Take 1 tablet (25 mg total) 3 (three) times daily as needed by mouth for dizziness. 270 tablet 3  . metFORMIN (GLUCOPHAGE) 1000 MG tablet TAKE 1 TABLET TWICE A DAY 180 tablet 1  . metoprolol succinate (TOPROL-XL) 100 MG 24 hr tablet TAKE ONE AND ONE-HALF TABLETS DAILY 135 tablet 0  . omeprazole (PRILOSEC) 40 MG capsule TAKE 1 CAPSULE DAILY 90 capsule 1  . ONE TOUCH LANCETS MISC Use to check blood sugar 1-2 times daily 200 each 0  . rOPINIRole (REQUIP) 1 MG tablet Take 1 tablet (1 mg total) by mouth daily. 90 tablet 3  . sertraline (ZOLOFT) 100 MG tablet TAKE 1 TABLET DAILY 90 tablet 1  . simvastatin (ZOCOR) 20 MG tablet TAKE 1 TABLET EVERY EVENING 90 tablet 1  . XARELTO 20 MG TABS tablet TAKE 1 TABLET DAILY WITH SUPPER 90 tablet 1   No facility-administered medications prior to visit.      Allergies:   Patient has no known allergies.   Social History   Socioeconomic History  . Marital status: Married    Spouse name: Not on file  . Number of children: Not on file  .  Years of education: Not on file  . Highest education level: Not on file  Occupational History  . Not on file  Social Needs  . Financial resource strain: Not on file  . Food insecurity:    Worry: Not on file    Inability: Not on file  . Transportation needs:    Medical: Not on file    Non-medical: Not on file  Tobacco Use  . Smoking status: Never Smoker  .  Smokeless tobacco: Never Used  Substance and Sexual Activity  . Alcohol use: Yes    Comment: occ  . Drug use: No  . Sexual activity: Not on file  Lifestyle  . Physical activity:    Days per week: Not on file    Minutes per session: Not on file  . Stress: Not on file  Relationships  . Social connections:    Talks on phone: Not on file    Gets together: Not on file    Attends religious service: Not on file    Active member of club or organization: Not on file    Attends meetings of clubs or organizations: Not on file    Relationship status: Not on file  Other Topics Concern  . Not on file  Social History Narrative  . Not on file     Family History:  The patient's family history includes Heart failure in his mother; Stroke in his father and mother.   ROS:   Please see the history of present illness.    ROS All other systems reviewed and are negative.   PHYSICAL EXAM:   VS:  BP 122/81   Pulse 82   Ht 5\' 11"  (1.803 m)   Wt 230 lb (104.3 kg)   BMI 32.08 kg/m      General: Alert, oriented x3, no distress, mildly obese Head: no evidence of trauma, PERRL, EOMI, no exophtalmos or lid lag, no myxedema, no xanthelasma; normal ears, nose and oropharynx Neck: normal jugular venous pulsations and no hepatojugular reflux; brisk carotid pulses without delay and no carotid bruits Chest: clear to auscultation, no signs of consolidation by percussion or palpation, normal fremitus, symmetrical and full respiratory excursions, well-healed left subclavian defibrillator site Cardiovascular: normal position and quality of the  apical impulse, regular rhythm, normal first and second heart sounds, no murmurs, rubs or gallops Abdomen: no tenderness or distention, no masses by palpation, no abnormal pulsatility or arterial bruits, normal bowel sounds, no hepatosplenomegaly Extremities: no clubbing, cyanosis or edema; 2+ radial, ulnar and brachial pulses bilaterally; 2+ right femoral, posterior tibial and dorsalis pedis pulses; 2+ left femoral, posterior tibial and dorsalis pedis pulses; no subclavian or femoral bruits Neurological: grossly nonfocal Psych: Normal mood and affect    Wt Readings from Last 3 Encounters:  01/18/18 230 lb (104.3 kg)  01/10/18 231 lb 3.2 oz (104.9 kg)  12/12/17 227 lb 6 oz (103.1 kg)      Studies/Labs Reviewed:   EKG:  EKG is not ordered today.    Recent Labs: 02/19/2017: Hemoglobin 13.5; Platelets 145.0 07/27/2017: ALT 14 10/26/2017: BUN 21; Creatinine, Ser 1.38; Potassium 4.9; Sodium 137   Lipid Panel    Component Value Date/Time   CHOL 134 07/27/2017 1158   TRIG 144.0 07/27/2017 1158   HDL 37.50 (L) 07/27/2017 1158   CHOLHDL 4 07/27/2017 1158   VLDL 28.8 07/27/2017 1158   LDLCALC 68 07/27/2017 1158     ASSESSMENT:    1. Chronic systolic heart failure ([place])   2. AVB (atrioventricular block)   3. Permanent atrial fibrillation ([place])   4. Biventricular ICD (implantable cardioverter-defibrillator) in place   5. Essential hypertension   6. Diabetes mellitus type 2 in obese ([place])   7. Dyslipidemia (high LDL; low HDL)      PLAN:  In order of problems listed above:  1. CHF: On appropriate therapy with RAAS inh and beta blocker. Despite the fact that [name] does not have any clinical signs of hypervolemia and  his thoracic impedance does not suggest volume overload, he has intolerance to exertion.  Predominant complaint appears to be fatigue rather than dyspnea.  Heart rate histograms show some degree of chronotropic incompetence and he is pacemaker dependent. 2. CHB: He does not  have any underlying escape rhythm.  Pacemaker dependent. 3. AFib: Permanent.  On anticoagulation. 4. CRT-D: Normal device function.  Reprogrammed the sensor settings more aggressively for both ADL and exercise.  Reevaluate in a couple of months when he sees Dr. Curt Bears. 5. HTN: Well-controlled. 6. DM/obesity/dyslipidemia: Although he has not had any serious vascular problems, he is at risk for developing these.  Encouraged weight loss.  On statin.  With the exception of a borderline low HDL cholesterol, his other metabolic parameters are reasonably well compensated.    Medication Adjustments/Labs and Tests Ordered: Current medicines are reviewed at length with the patient today.  Concerns regarding medicines are outlined above.  Medication changes, Labs and Tests ordered today are listed in the Patient Instructions below. Patient Instructions  Dr Sallyanne Kuster recommends that you schedule a follow-up appointment in 6 months with an ICD check. You will receive a reminder letter in the mail two months in advance. If you Mason [name]'t receive a letter, please call our office to schedule the follow-up appointment.  If you need a refill on your cardiac medications before your next appointment, please call your pharmacy.    Signed, [name], MD  01/18/2018 5:39 PM    [address], [place], [place]  [address] Phone: [phone]; Fax: [phone]

## 2018-01-18 NOTE — Patient Instructions (Signed)
Dr Sallyanne Kuster recommends that you schedule a follow-up appointment in 6 months with an ICD check. You will receive a reminder letter in the mail two months in advance. If you don't receive a letter, please call our office to schedule the follow-up appointment.  If you need a refill on your cardiac medications before your next appointment, please call your pharmacy.

## 2018-01-20 ENCOUNTER — Encounter: Payer: Self-pay | Admitting: Family Medicine

## 2018-01-22 ENCOUNTER — Ambulatory Visit (INDEPENDENT_AMBULATORY_CARE_PROVIDER_SITE_OTHER): Payer: Medicare Other | Admitting: *Deleted

## 2018-01-22 DIAGNOSIS — I482 Chronic atrial fibrillation: Secondary | ICD-10-CM

## 2018-01-22 DIAGNOSIS — I4821 Permanent atrial fibrillation: Secondary | ICD-10-CM

## 2018-01-22 DIAGNOSIS — I5022 Chronic systolic (congestive) heart failure: Secondary | ICD-10-CM

## 2018-01-22 NOTE — Progress Notes (Signed)
Remote ICD transmission.   

## 2018-01-23 ENCOUNTER — Other Ambulatory Visit: Payer: Self-pay

## 2018-01-23 ENCOUNTER — Ambulatory Visit (INDEPENDENT_AMBULATORY_CARE_PROVIDER_SITE_OTHER): Payer: Medicare Other | Admitting: Physician Assistant

## 2018-01-23 ENCOUNTER — Encounter: Payer: Self-pay | Admitting: Physician Assistant

## 2018-01-23 VITALS — BP 110/80 | HR 82 | Temp 97.5°F | Resp 16 | Ht 71.0 in | Wt 228.0 lb

## 2018-01-23 DIAGNOSIS — S0502XA Injury of conjunctiva and corneal abrasion without foreign body, left eye, initial encounter: Secondary | ICD-10-CM | POA: Diagnosis not present

## 2018-01-23 MED ORDER — POLYMYXIN B-TRIMETHOPRIM 10000-0.1 UNIT/ML-% OP SOLN: 1.0000 [drp] | Freq: Four times a day (QID) | OPHTHALMIC | 0 refills | Status: DC

## 2018-01-23 NOTE — Patient Instructions (Signed)
Please keep from rubbing the eye.  Apply warm compress to help promote any drainage. Start the antibiotic drop and use as directed.  Symptoms should get better daily. Always make sure to wear protective goggles when weed eating.  If you note any worsening symptoms or vision changes, come see me or Dr. Katy Fitch ASAP.   Corneal Abrasion A corneal abrasion is a scratch or injury to the clear covering over the front of your eye (cornea). This can be painful. It is important to get treatment for a corneal abrasion. If this problem is not treated, it can affect your eyesight (vision). Follow these instructions at home: Medicines  Use eye drops or ointments as told by your doctor.  If you were prescribed antibiotic drops or ointment, use them as told by your doctor. Do not stop using the antibiotic even if you start to feel better.  Take over-the-counter and prescription medicines only as told by your doctor.  Do not drive or use heavy machinery while taking prescription pain medicine. General instructions  If you have an eye patch, wear it as told by your doctor. ? Do not drive or use machinery while wearing an eye patch. ? Follow instructions from your doctor about when to take off the patch.  Ask your doctor if you can use a cold, wet cloth (compress) on your eye to help with pain.  Do not rub or touch your eye. Do not wash out your eye.  Do not wear contact lenses until your doctor says that this is okay.  Avoid bright light.  Avoid straining your eyes.  Keep all follow-up visits as told by your doctor. Doing this can help to prevent infection and loss of eyesight. Contact a doctor if:  You continue to have eye pain and other symptoms for more than 2 days.  You get new symptoms, such as: ? Redness. ? Watery eyes (tearing). ? Discharge.  You have discharge that makes your eyelids stick together in the morning.  Symptoms come back after your eye heals. Get help right away  if:  You have very bad eye pain that does not get better with medicine.  You lose eyesight. Summary  A corneal abrasion is a scratch or injury to the clear covering over the front of your eye (cornea).  It is important to get treatment for a corneal abrasion. If this problem is not treated, it can affect your eyesight (vision).  Use eye drops or ointments as told by your doctor.  If you have an eye patch, do not drive or use machinery while wearing it. This information is not intended to replace advice given to you by your health care provider. Make sure you discuss any questions you have with your health care provider. Document Released: 12/27/2007 Document Revised: 06/24/2016 Document Reviewed: 06/24/2016 Elsevier Interactive Patient Education  2017 [employer].

## 2018-01-23 NOTE — Progress Notes (Signed)
Patient presents to clinic today c/o 1 day of left eye irritation and foreign body sensation (lateral) after getting grass/dirt in his eye while weed-eating yesterday. Patient notes he was not wearing protective eye wear at the time. Notes redness of eye without drainage. Denies acute vision changes.    Past Medical History:  Diagnosis Date  . AICD (automatic cardioverter/defibrillator) present 2018   MDT CRT-D.  Fatigue-->completely pacer dependent.  Pacer settings adjusted 12/2017 to allow more chronotrophc variance with ADL's//exertion.  . Arthritis    left shoulder (Dr. [name]): steroid injection 04/03/16.  Plan for TSA surgery 06/2016.  [name] BPH (benign prostatic hypertrophy) 06/2011   Irritative sx's; pt declined trial of anticholinergic per Urology records  . Chronic combined systolic and diastolic heart failure ([place]) 05/31/2012   Nonischemic:  EF 40-45%, LA mod-severe dilated, AFIB.   02/2016 EF 40%, diffuse hypokinesis, grade 2 DD.  Myoc perf imaging showed EF 32% 04/2016.  Pt upgraded to CRT-D 01/04/17.  [name] Chronic renal insufficiency, stage III (moderate) (HCC) 2015   CrCl about 60 ml/min  . Complete heart block (HCC)    Has dual chamber pacer.  . Depression   . DOE (dyspnea on exertion)    NYHA class II/III CHF  . Episodic low back pain 01/22/2013   w/intermittent radiculitis (12/2014 his neurologist referred him to pain mgmt for epidural steroid injection)  . Erectile dysfunction 2019   due to zoloft--urol rx'd viagra  . GERD (gastroesophageal reflux disease)   . H/O tilt table evaluation 11/02/05   negative  . Helicobacter pylori gastritis 01/2016  . High cholesterol   . History of adenomatous polyp of colon 10/12/11   Dr. [name] (3 right side of colon- tubular adenomas removed)  . History of cardiovascular stress test 05/28/12   no ischemia, EF 37%, imaging results are unchanged and within normal variance  . History of chronic prostatitis   . History of kidney stones   .  History of vertigo    + Hx of posterior HA's.  Neuro (Dr. Erling Cruz) eval 2011.  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  . Hypertension   . Lumbar spondylosis    lumbosacral radiculopathy at L4 by EMG testing, right foot drop (neurologist is Dr. Linus Salmons with [facility] in [place])--neurologist referred him to neurosurgery  . Migraine    "used to have them all the time; none for years" (01/04/2017)  . Myocardial infarction [facility]) ?1970s  . Nephrolithiasis 07/2012   Left UVJ 2 mm stone with dilation of renal collecting system and slight hydroureter on right  . Neuropathy   . 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  Since no CAD on cath 05/2016, cards recommends upgrade to CRT-D.  [name] Permanent atrial fibrillation ([place])    DCCV 07/09/13-converted, lasted two days, then back into afib--needs lifetime anticoagulation (Xarelto as of 09/2014)  . Prostate cancer screening 09/2017   done by urol annually (normal prostate exam documented + PSA 0.84 as of 10/01/17 urol f/u.  [name] Rectus diastasis   . Right ankle sprain 08/2017   w/distal fibula avulsion fx noted on u/s but not plain film-(Dr. Hudnall).  . Skin cancer of arm, left    "burned it off" (01/04/2017)  . Type II diabetes mellitus ([place])     Current Outpatient Medications on File Prior to Visit  Medication Sig Dispense Refill  . acetaminophen (TYLENOL) 325 MG tablet Take  2 tablets (650 mg total) by mouth every 6 (six) hours as needed for mild pain or moderate pain.    [name] colchicine 0.6 MG tablet Take 0.6 mg by mouth daily as needed (for gout).     . COMBIGAN 0.2-0.5 % ophthalmic solution Place 1 drop into both eyes 2 (two) times daily.     [name] ENTRESTO 97-103 MG TAKE 1 TABLET TWICE A DAY 180 tablet 1  . gabapentin (NEURONTIN) 300 MG capsule TAKE 1 CAPSULE THREE TIMES A DAY (Patient taking differently: Take 2  capsules in the AM and 1 capsule at bedtime.) 270 capsule 3  . GLIPIZIDE XL 5 MG 24 hr tablet TAKE 1 TABLET EVERY MORNING 90 tablet 1  . glucose blood test strip Use to check blood sugars 1-2 times daily 100 each 3  . JANUVIA 100 MG tablet TAKE 1 TABLET DAILY 90 tablet 1  . latanoprost (XALATAN) 0.005 % ophthalmic solution Place 1 drop into both eyes at bedtime.   12  . meclizine (ANTIVERT) 25 MG tablet Take 1 tablet (25 mg total) 3 (three) times daily as needed by mouth for dizziness. 270 tablet 3  . metFORMIN (GLUCOPHAGE) 1000 MG tablet TAKE 1 TABLET TWICE A DAY 180 tablet 1  . metoprolol succinate (TOPROL-XL) 100 MG 24 hr tablet TAKE ONE AND ONE-HALF TABLETS DAILY 135 tablet 0  . omeprazole (PRILOSEC) 40 MG capsule TAKE 1 CAPSULE DAILY 90 capsule 1  . ONE TOUCH LANCETS MISC Use to check blood sugar 1-2 times daily 200 each 0  . rOPINIRole (REQUIP) 1 MG tablet Take 1 tablet (1 mg total) by mouth daily. 90 tablet 3  . sertraline (ZOLOFT) 100 MG tablet TAKE 1 TABLET DAILY 90 tablet 1  . simvastatin (ZOCOR) 20 MG tablet TAKE 1 TABLET EVERY EVENING 90 tablet 1  . XARELTO 20 MG TABS tablet TAKE 1 TABLET DAILY WITH SUPPER 90 tablet 1   No current facility-administered medications on file prior to visit.     No Known Allergies  Family History  Problem Relation Age of Onset  . Heart failure Mother   . Stroke Mother   . Stroke Father     Social History   Socioeconomic History  . Marital status: Married    Spouse name: Not on file  . Number of children: Not on file  . Years of education: Not on file  . Highest education level: Not on file  Occupational History  . Not on file  Social Needs  . Financial resource strain: Not on file  . Food insecurity:    Worry: Not on file    Inability: Not on file  . Transportation needs:    Medical: Not on file    Non-medical: Not on file  Tobacco Use  . Smoking status: Never Smoker  . Smokeless tobacco: Never Used  Substance and Sexual  Activity  . Alcohol use: Yes    Comment: occ  . Drug use: No  . Sexual activity: Not on file  Lifestyle  . Physical activity:    Days per week: Not on file    Minutes per session: Not on file  . Stress: Not on file  Relationships  . Social connections:    Talks on phone: Not on file    Gets together: Not on file    Attends religious service: Not on file    Active member of club or organization: Not on file    Attends meetings of clubs or organizations: Not on  file    Relationship status: Not on file  Other Topics Concern  . Not on file  Social History Narrative  . Not on file   Review of Systems - See HPI.  All other ROS are negative.  BP 110/80   Pulse 82   Temp (!) 97.5 F (36.4 C) (Oral)   Resp 16   Ht 5\' 11"  (1.803 m)   Wt 228 lb (103.4 kg)   SpO2 98%   BMI 31.80 kg/m   Physical Exam  Constitutional: He is oriented to person, place, and time. He appears well-developed and well-nourished.  Eyes: Pupils are equal, round, and reactive to light. EOM and lids are normal. Lids are everted and swept, no foreign bodies found. Right eye exhibits no discharge. Left eye exhibits no discharge and no exudate. No foreign body present in the left eye. Right conjunctiva is not injected. Right conjunctiva has no hemorrhage. Left conjunctiva is injected. Left conjunctiva has no hemorrhage.  Slit lamp exam:      The left eye shows corneal abrasion and fluorescein uptake. The left eye shows no corneal ulcer, no foreign body, no hyphema and no anterior chamber bulge.  Pulmonary/Chest: Effort normal.  Neurological: He is alert and oriented to person, place, and time.  Psychiatric: He has a normal mood and affect.  Vitals reviewed.  Recent Results (from the past 2160 hour(s))  Basic metabolic panel     Status: Abnormal   Collection Time: 10/26/17 10:06 AM  Result Value Ref Range   Sodium 137 135 - 145 mEq/L   Potassium 4.9 3.5 - 5.1 mEq/L   Chloride 102 96 - 112 mEq/L   CO2 30 19 -  32 mEq/L   Glucose, Bld 147 (H) 70 - 99 mg/dL   BUN 21 6 - 23 mg/dL   Creatinine, Ser 1.38 0.40 - 1.50 mg/dL   Calcium 9.3 8.4 - 10.5 mg/dL   GFR 53.35 (L) >60.00 mL/min  Hemoglobin A1c     Status: Abnormal   Collection Time: 10/26/17 10:06 AM  Result Value Ref Range   Hgb A1c MFr Bld 8.0 (H) 4.6 - 6.5 %    Comment: Glycemic Control Guidelines for People with Diabetes:Non Diabetic:  <6%Goal of Therapy: <7%Additional Action Suggested:  >8%    Assessment/Plan: 1. Abrasion of left cornea, initial encounter No evidence of foreign body on examination. Fluorescin staining performed and eye examined with UV lighting in dark room which revealed mild abrasion to lateral cornea. Will start supportive measures. Rx Polytrim OP to use as directed. Strict return precautions reviewed with patient.    Leeanne Rio, PA-C

## 2018-01-25 ENCOUNTER — Other Ambulatory Visit: Payer: Self-pay | Admitting: Family Medicine

## 2018-01-25 LAB — CUP PACEART INCLINIC DEVICE CHECK
Battery Remaining Longevity: 82 mo
Battery Voltage: 2.99 V
Brady Statistic AP VP Percent: 0 %
Brady Statistic AP VS Percent: 0 %
Brady Statistic AS VP Percent: 0 %
Brady Statistic AS VS Percent: 0 %
Brady Statistic RA Percent Paced: 0 %
Brady Statistic RV Percent Paced: 99.96 %
Date Time Interrogation Session: 20190628121630
HighPow Impedance: 73 Ohm
Implantable Lead Implant Date: 20130715
Implantable Lead Implant Date: 20180614
Implantable Lead Implant Date: 20180614
Implantable Lead Location: 753858
Implantable Lead Location: 753859
Implantable Lead Location: 753860
Implantable Lead Model: 4598
Implantable Pulse Generator Implant Date: 20180614
Lead Channel Impedance Value: 184.154
Lead Channel Impedance Value: 184.154
Lead Channel Impedance Value: 188.1 Ohm
Lead Channel Impedance Value: 204.14 Ohm
Lead Channel Impedance Value: 204.14 Ohm
Lead Channel Impedance Value: 342 Ohm
Lead Channel Impedance Value: 361 Ohm
Lead Channel Impedance Value: 399 Ohm
Lead Channel Impedance Value: 399 Ohm
Lead Channel Impedance Value: 418 Ohm
Lead Channel Impedance Value: 456 Ohm
Lead Channel Impedance Value: 513 Ohm
Lead Channel Impedance Value: 513 Ohm
Lead Channel Impedance Value: 646 Ohm
Lead Channel Impedance Value: 646 Ohm
Lead Channel Impedance Value: 665 Ohm
Lead Channel Impedance Value: 703 Ohm
Lead Channel Impedance Value: 703 Ohm
Lead Channel Pacing Threshold Amplitude: 0.5 V
Lead Channel Pacing Threshold Amplitude: 0.5 V
Lead Channel Pacing Threshold Pulse Width: 0.4 ms
Lead Channel Pacing Threshold Pulse Width: 1 ms
Lead Channel Sensing Intrinsic Amplitude: 0.625 mV
Lead Channel Setting Pacing Amplitude: 1 V
Lead Channel Setting Pacing Amplitude: 2 V
Lead Channel Setting Pacing Pulse Width: 0.4 ms
Lead Channel Setting Pacing Pulse Width: 1 ms
Lead Channel Setting Sensing Sensitivity: 0.3 mV

## 2018-01-28 ENCOUNTER — Other Ambulatory Visit: Payer: Self-pay

## 2018-01-30 ENCOUNTER — Other Ambulatory Visit: Payer: Self-pay

## 2018-01-30 MED ORDER — FREESTYLE LANCETS MISC: 6 refills | Status: DC

## 2018-02-01 ENCOUNTER — Ambulatory Visit: Payer: Medicare Other | Admitting: Family Medicine

## 2018-02-06 ENCOUNTER — Encounter: Payer: Self-pay | Admitting: Family Medicine

## 2018-02-06 ENCOUNTER — Ambulatory Visit (INDEPENDENT_AMBULATORY_CARE_PROVIDER_SITE_OTHER): Payer: Medicare Other | Admitting: Family Medicine

## 2018-02-06 VITALS — BP 109/76 | HR 92 | Temp 98.0°F | Resp 16 | Ht 71.0 in | Wt 227.2 lb

## 2018-02-06 DIAGNOSIS — N183 Chronic kidney disease, stage 3 unspecified: Secondary | ICD-10-CM

## 2018-02-06 DIAGNOSIS — E878 Other disorders of electrolyte and fluid balance, not elsewhere classified: Secondary | ICD-10-CM | POA: Diagnosis not present

## 2018-02-06 DIAGNOSIS — E669 Obesity, unspecified: Secondary | ICD-10-CM

## 2018-02-06 DIAGNOSIS — I10 Essential (primary) hypertension: Secondary | ICD-10-CM | POA: Diagnosis not present

## 2018-02-06 DIAGNOSIS — E118 Type 2 diabetes mellitus with unspecified complications: Secondary | ICD-10-CM

## 2018-02-06 DIAGNOSIS — N2889 Other specified disorders of kidney and ureter: Secondary | ICD-10-CM

## 2018-02-06 DIAGNOSIS — E7800 Pure hypercholesterolemia, unspecified: Secondary | ICD-10-CM

## 2018-02-06 MED ORDER — DIAZEPAM 5 MG PO TABS
ORAL_TABLET | ORAL | 1 refills | Status: DC
Start: 2018-02-06 — End: 2018-03-04

## 2018-02-06 NOTE — Progress Notes (Signed)
OFFICE VISIT  02/06/2018   CC:  Chief Complaint  Patient presents with  . Follow-up    RCI, pt is fasting.    HPI:    Patient is a 77 y.o. Caucasian male who presents for 3 mo f/u DM 2, HTN, HLD, CRI II/III (GFR @ 60 ml/min). He has permanent A-fib and complete heart block with CRT-D, also with nonischemic CM-->followed by Dr. Sallyanne Kuster (most recently 01/18/18, at which time he was having some DOE and chronotropic incompetence was suspected as cause: his CRT-D was reprogrammed the sensor settings to allow better adjustment to exertion/ADL's at that time). He thinks this has helped limit exertional fatigue so far.  Kidney function and electrolytes were stable at last f/u, but A1c was hovering at 8% still.  I recommended he start victoza but he declined and said he wanted to get back on a more strict diet and exercise regimen first. If this does not help, he says he would start victoza.  Says he is feeling good except still with intermittent dizziness/disequilibrium, and says meclizine doesn't help. Still also with fairly regular bouts of positional vertigo.  No side effects from meclizine.  DM: 140-160 fastings.  No signif TLC since last f/u visit. Feet--> feels constant burning and tingling, no numbness.  No hx of foot ulcer.  HTN: BP's normal---130s/80s with rare 694 systolic.  HLD: taking simvastatin 20mg  qd.  CRI: has had a cup of coffee and bottle of water today.  ROS: no CP, no SOB, no LE swelling, no focal weakness, no melena/hematochezia, no nosebleeds, no gross hematuria.  Past Medical History:  Diagnosis Date  . AICD (automatic cardioverter/defibrillator) present 2018   MDT CRT-D.  Fatigue-->completely pacer dependent.  Pacer settings adjusted 12/2017 to allow more chronotrophc variance with ADL's//exertion.  . Arthritis    Pt is s/p eft reverse total shoulder arthroplasty.  [name] BPH (benign prostatic hypertrophy) 06/2011   Irritative sx's; pt declined trial of  anticholinergic per Urology records  . Chronic combined systolic and diastolic heart failure ([place]) 05/31/2012   Nonischemic:  EF 40-45%, LA mod-severe dilated, AFIB.   02/2016 EF 40%, diffuse hypokinesis, grade 2 DD.  Myoc perf imaging showed EF 32% 04/2016.  Pt upgraded to CRT-D 01/04/17.  [name] Chronic renal insufficiency, stage III (moderate) (HCC) 2015   CrCl about 60 ml/min  . Complete heart block (HCC)    Has dual chamber pacer.  . Depression   . DOE (dyspnea on exertion)    NYHA class II/III CHF  . Episodic low back pain 01/22/2013   w/intermittent radiculitis (12/2014 his neurologist referred him to pain mgmt for epidural steroid injection)  . Erectile dysfunction 2019   due to zoloft--urol rx'd viagra  . GERD (gastroesophageal reflux disease)   . H/O tilt table evaluation 11/02/05   negative  . Helicobacter pylori gastritis 01/2016  . High cholesterol   . History of adenomatous polyp of colon 10/12/11   Dr. [name] (3 right side of colon- tubular adenomas removed)  . History of cardiovascular stress test 05/28/12   no ischemia, EF 37%, imaging results are unchanged and within normal variance  . History of chronic prostatitis   . History of kidney stones   . History of vertigo    + Hx of posterior HA's.  Neuro (Dr. Erling Cruz) eval 2011.  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  . Hypertension   . Lumbar spondylosis    lumbosacral radiculopathy at L4 by  EMG testing, right foot drop (neurologist is Dr. Linus Salmons with [facility] in [place])--neurologist referred him to neurosurgery  . Migraine    "used to have them all the time; none for years" (01/04/2017)  . Myocardial infarction [facility]) ?1970s  . Nephrolithiasis 07/2012   Left UVJ 2 mm stone with dilation of renal collecting system and slight hydroureter on right  . Neuropathy   . Pacemaker 02/05/2012   dual chamber, complete heart block, meddtronic revo,  lasted checked 12/2015.  Since no CAD on cath 05/2016, cards recommends upgrade to CRT-D.  [name] Permanent atrial fibrillation ([place])    DCCV 07/09/13-converted, lasted two days, then back into afib--needs lifetime anticoagulation (Xarelto as of 09/2014)  . Prostate cancer screening 09/2017   done by urol annually (normal prostate exam documented + PSA 0.84 as of 10/01/17 urol f/u.  [name] Rectus diastasis   . Right ankle sprain 08/2017   w/distal fibula avulsion fx noted on u/s but not plain film-(Dr. Hudnall).  . Skin cancer of arm, left    "burned it off" (01/04/2017)  . Type II diabetes mellitus ([place])     Past Surgical History:  Procedure Laterality Date  . BACK SURGERY    . BI-VENTRICULAR IMPLANTABLE CARDIOVERTER DEFIBRILLATOR  (CRT-D) Left 01/04/2017  . BIV ICD INSERTION CRT-D N/A 01/04/2017   Procedure: BiV ICD ;  Surgeon: Constance Haw, MD;  Location: [facility];  Service: Cardiovascular;  Laterality: N/A;  . CARDIAC CATHETERIZATION N/A 06/14/2016   Minimal nonobstructive dz, EF 25-35%.  Procedure: Left Heart Cath and Coronary Angiography;  Surgeon: Peter M Martinique, MD;  Location: [facility];  Service: Cardiovascular;  Laterality: N/A;  . CARDIOVASCULAR STRESS TEST  2012   2012 nuclear perfusion study: low risk scan; 04/2016 normal myocardial perfusion imaging, EF 32%.  [name] CARDIOVERSION  07/09/2012   Procedure: CARDIOVERSION;  Surgeon: [name], MD;  Location: [facility];  Service: Cardiovascular;  Laterality: N/A;  . CATARACT EXTRACTION W/ INTRAOCULAR LENS IMPLANT & ANTERIOR VITRECTOMY, BILATERAL Bilateral   . COLONOSCOPY W/ POLYPECTOMY  approx 2006; repeated 09/2011   Polyps on 2013 EGD as well, repeat 12/2014  . ESOPHAGOGASTRODUODENOSCOPY  10/18/06   Done due to chronic GERD: Normal, bx showed no barrett's esophagus (Dr. [name])  . FLEXOR TENDON REPAIR Left 10/02/2016   Procedure: LEFT RING FINGER WOUND EXPORATION AND FLEXOR TENDON REPAIR AND NERVE REPAIR;  Surgeon:  Milly Jakob, MD;  Location: [facility];  Service: Orthopedics;  Laterality: Left;  . INSERT / REPLACE / REMOVE PACEMAKER  02/05/2012   dual chamber, sinus node dysfunction, sinus arrest, PAF, Medtronic Revo serial#-[id number] H: last checked 05/2015  . LUMBAR LAMINECTOMY Left 1976   L4-5  . PACEMAKER REMOVAL  01/04/2017  . PERMANENT PACEMAKER INSERTION N/A 02/05/2012   Procedure: PERMANENT PACEMAKER INSERTION;  Surgeon: [name], MD; Generator Medtronic Revo model [place] serial number [id number] H Laterality: N/A;  . RETINAL DETACHMENT SURGERY Left ~ 1999  . REVERSE SHOULDER ARTHROPLASTY Left 2018   Left shoulder reverse [name] in [place]).  . TRANSTHORACIC ECHOCARDIOGRAM  08/25/10; 05/2012; 03/23/16   mild asymmetric LVH, normal systolic function, normal diastolic fxn, mild-to-mod mitral regurg, mild aortic valve sclerosis and trace AI, mild aortic root dilatation. 2014 f/u showed EF 40-45%, mod LAE, A FIB.  02/2016 EF 40%, diffuse hypokinesis, grade 2 DD.    Outpatient Medications Prior to Visit  Medication Sig Dispense Refill  . acetaminophen (TYLENOL) 325 MG tablet Take 2 tablets (650 mg total)  by mouth every 6 (six) hours as needed for mild pain or moderate pain.    [name] colchicine 0.6 MG tablet Take 0.6 mg by mouth daily as needed (for gout).     . COMBIGAN 0.2-0.5 % ophthalmic solution Place 1 drop into both eyes 2 (two) times daily.     [name] ENTRESTO 97-103 MG TAKE 1 TABLET TWICE A DAY 180 tablet 1  . gabapentin (NEURONTIN) 300 MG capsule TAKE 1 CAPSULE THREE TIMES A DAY (Patient taking differently: Take 2 capsules in the AM and 1 capsule at bedtime.) 270 capsule 3  . GLIPIZIDE XL 5 MG 24 hr tablet TAKE 1 TABLET EVERY MORNING 90 tablet 1  . glucose blood test strip Use to check blood sugars 1-2 times daily 100 each 3  . JANUVIA 100 MG tablet TAKE 1 TABLET DAILY 90 tablet 1  . Lancets (FREESTYLE) lancets Use as instructed 100 each 6  . latanoprost (XALATAN) 0.005 % ophthalmic  solution Place 1 drop into both eyes at bedtime.   12  . meclizine (ANTIVERT) 25 MG tablet Take 1 tablet (25 mg total) 3 (three) times daily as needed by mouth for dizziness. 270 tablet 3  . metFORMIN (GLUCOPHAGE) 1000 MG tablet TAKE 1 TABLET TWICE A DAY 180 tablet 1  . omeprazole (PRILOSEC) 40 MG capsule TAKE 1 CAPSULE DAILY 90 capsule 1  . rOPINIRole (REQUIP) 1 MG tablet Take 1 tablet (1 mg total) by mouth daily. 90 tablet 3  . sertraline (ZOLOFT) 100 MG tablet TAKE 1 TABLET DAILY 90 tablet 0  . simvastatin (ZOCOR) 20 MG tablet TAKE 1 TABLET EVERY EVENING 90 tablet 1  . TOPROL XL 100 MG 24 hr tablet TAKE ONE AND ONE-HALF TABLETS DAILY 135 tablet 0  . XARELTO 20 MG TABS tablet TAKE 1 TABLET DAILY WITH SUPPER 90 tablet 1  . trimethoprim-polymyxin b (POLYTRIM) ophthalmic solution Place 1 drop into the left eye every 6 (six) hours. For 5 days. (Patient not taking: Reported on 02/06/2018) 10 mL 0   No facility-administered medications prior to visit.     No Known Allergies  ROS As per HPI  PE: Blood pressure 109/76, pulse 92, temperature 98 F (36.7 C), temperature source Oral, resp. rate 16, height 5\' 11"  (1.803 m), weight 227 lb 4 oz (103.1 kg), SpO2 95 %. Gen: Alert, well appearing.  Patient is oriented to person, place, time, and situation. AFFECT: pleasant, lucid thought and speech. CV: RRR (rate 75 on my exam), no m/r/g.   LUNGS: CTA bilat, nonlabored resps, good aeration in all lung fields. EXT: no clubbing, cyanosis, or edema.  Foot exam - bilateral normal; no swelling, tenderness or skin or vascular lesions. Color and temperature is normal. Sensation is intact. Peripheral pulses are palpable. Toenails are normal.    LABS:  Lab Results  Component Value Date   TSH 3.37 06/06/2016   Lab Results  Component Value Date   WBC 7.0 02/19/2017   HGB 13.5 02/19/2017   HCT 40.9 02/19/2017   MCV 90.2 02/19/2017   PLT 145.0 (L) 02/19/2017   Lab Results  Component Value Date    CREATININE 1.38 10/26/2017   BUN 21 10/26/2017   NA 137 10/26/2017   K 4.9 10/26/2017   CL 102 10/26/2017   CO2 30 10/26/2017   Lab Results  Component Value Date   ALT 14 07/27/2017   AST 18 07/27/2017   ALKPHOS 78 07/27/2017   BILITOT 0.5 07/27/2017   Lab Results  Component Value  Date   CHOL 134 07/27/2017   Lab Results  Component Value Date   HDL 37.50 (L) 07/27/2017   Lab Results  Component Value Date   LDLCALC 68 07/27/2017   Lab Results  Component Value Date   TRIG 144.0 07/27/2017   Lab Results  Component Value Date   CHOLHDL 4 07/27/2017   Lab Results  Component Value Date   HGBA1C 8.0 (H) 10/26/2017    IMPRESSION AND PLAN:  1) DM 2: control is fair, pt is in favor of goal A1c around 8%. Hba1c today. Feet exam today: normal. Urine microalb/cr today. BMET today.  2) HTN: The current medical regimen is effective;  continue present plan and medications. BMET today.  3) HLD: tolerating statin.  LDL 68 6 mo ago.  Plan repeat FLP 6 mo. Hepatic panel normal 6 mo ago.  4) CRI II/III: hydrate adequately---I'm not convinced he does this. Avoid NSAIDs.  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 Continue meclizine but add diazepam.  Start at 1/2 of 5mg  tab bid and gradually increase to whole tab bid. Therapeutic expectations and side effect profile of medication discussed today.  Patient's questions answered.  An After Visit Summary was printed and given to the patient.  FOLLOW UP: Return in about 3 months (around 05/09/2018) for routine chronic illness f/u.  Signed:  Crissie Sickles, MD           02/06/2018

## 2018-02-07 LAB — BASIC METABOLIC PANEL
BUN: 33 mg/dL — ABNORMAL HIGH (ref 6–23)
CO2: 28 mEq/L (ref 19–32)
Calcium: 9.3 mg/dL (ref 8.4–10.5)
Chloride: 99 mEq/L (ref 96–112)
Creatinine, Ser: 1.96 mg/dL — ABNORMAL HIGH (ref 0.40–1.50)
GFR: 35.56 mL/min — ABNORMAL LOW (ref >60.00)
Glucose, Bld: 154 mg/dL — ABNORMAL HIGH (ref 70–99)
Potassium: 4 mEq/L (ref 3.5–5.1)
Sodium: 138 mEq/L (ref 135–145)

## 2018-02-07 LAB — MICROALBUMIN / CREATININE URINE RATIO
Creatinine,U: 244.2 mg/dL
Microalb Creat Ratio: 1.4 mg/g (ref 0.0–30.0)
Microalb, Ur: 3.5 mg/dL — ABNORMAL HIGH (ref 0.0–1.9)

## 2018-02-07 LAB — HEMOGLOBIN A1C: Hgb A1c MFr Bld: 8.1 % — ABNORMAL HIGH (ref 4.6–6.5)

## 2018-02-09 LAB — CUP PACEART REMOTE DEVICE CHECK
Battery Remaining Longevity: 82 mo
Battery Voltage: 2.98 V
Brady Statistic AP VP Percent: 0 %
Brady Statistic AP VS Percent: 0 %
Brady Statistic AS VP Percent: 0 %
Brady Statistic AS VS Percent: 0 %
Brady Statistic RA Percent Paced: 0 %
Brady Statistic RV Percent Paced: 99.96 %
Date Time Interrogation Session: 20190702052404
HighPow Impedance: 74 Ohm
Implantable Lead Implant Date: 20130715
Implantable Lead Implant Date: 20180614
Implantable Lead Implant Date: 20180614
Implantable Lead Location: 753858
Implantable Lead Location: 753859
Implantable Lead Location: 753860
Implantable Lead Model: 4598
Implantable Pulse Generator Implant Date: 20180614
Lead Channel Impedance Value: 189.525
Lead Channel Impedance Value: 193.707
Lead Channel Impedance Value: 193.707
Lead Channel Impedance Value: 204.14 Ohm
Lead Channel Impedance Value: 209 Ohm
Lead Channel Impedance Value: 361 Ohm
Lead Channel Impedance Value: 361 Ohm
Lead Channel Impedance Value: 399 Ohm
Lead Channel Impedance Value: 418 Ohm
Lead Channel Impedance Value: 418 Ohm
Lead Channel Impedance Value: 475 Ohm
Lead Channel Impedance Value: 513 Ohm
Lead Channel Impedance Value: 513 Ohm
Lead Channel Impedance Value: 665 Ohm
Lead Channel Impedance Value: 703 Ohm
Lead Channel Impedance Value: 703 Ohm
Lead Channel Impedance Value: 722 Ohm
Lead Channel Impedance Value: 760 Ohm
Lead Channel Pacing Threshold Amplitude: 0.5 V
Lead Channel Pacing Threshold Amplitude: 0.625 V
Lead Channel Pacing Threshold Pulse Width: 0.4 ms
Lead Channel Pacing Threshold Pulse Width: 1 ms
Lead Channel Sensing Intrinsic Amplitude: 0.625 mV
Lead Channel Setting Pacing Amplitude: 1 V
Lead Channel Setting Pacing Amplitude: 2 V
Lead Channel Setting Pacing Pulse Width: 0.4 ms
Lead Channel Setting Pacing Pulse Width: 1 ms
Lead Channel Setting Sensing Sensitivity: 0.3 mV

## 2018-02-13 ENCOUNTER — Encounter: Payer: Self-pay | Admitting: Family Medicine

## 2018-02-14 ENCOUNTER — Other Ambulatory Visit: Payer: Self-pay | Admitting: *Deleted

## 2018-02-14 ENCOUNTER — Encounter: Payer: Self-pay | Admitting: *Deleted

## 2018-02-14 DIAGNOSIS — N179 Acute kidney failure, unspecified: Secondary | ICD-10-CM

## 2018-02-14 MED ORDER — GLIPIZIDE ER 10 MG PO TB24: 10.0000 mg | ORAL_TABLET | Freq: Every morning | ORAL | 3 refills | Status: DC

## 2018-02-25 ENCOUNTER — Other Ambulatory Visit (INDEPENDENT_AMBULATORY_CARE_PROVIDER_SITE_OTHER): Payer: Medicare Other

## 2018-02-25 DIAGNOSIS — N179 Acute kidney failure, unspecified: Secondary | ICD-10-CM | POA: Diagnosis not present

## 2018-02-26 LAB — BASIC METABOLIC PANEL
BUN: 18 mg/dL (ref 6–23)
CO2: 28 mEq/L (ref 19–32)
Calcium: 9.5 mg/dL (ref 8.4–10.5)
Chloride: 101 mEq/L (ref 96–112)
Creatinine, Ser: 1.34 mg/dL (ref 0.40–1.50)
GFR: 55.14 mL/min — ABNORMAL LOW (ref >60.00)
Glucose, Bld: 293 mg/dL — ABNORMAL HIGH (ref 70–99)
Potassium: 5.1 mEq/L (ref 3.5–5.1)
Sodium: 136 mEq/L (ref 135–145)

## 2018-03-04 ENCOUNTER — Encounter: Payer: Self-pay | Admitting: Cardiology

## 2018-03-04 ENCOUNTER — Ambulatory Visit (INDEPENDENT_AMBULATORY_CARE_PROVIDER_SITE_OTHER): Payer: Medicare Other | Admitting: Cardiology

## 2018-03-04 VITALS — BP 122/82 | HR 78 | Ht 71.0 in | Wt 232.0 lb

## 2018-03-04 DIAGNOSIS — I5042 Chronic combined systolic (congestive) and diastolic (congestive) heart failure: Secondary | ICD-10-CM

## 2018-03-04 DIAGNOSIS — I10 Essential (primary) hypertension: Secondary | ICD-10-CM

## 2018-03-04 DIAGNOSIS — I4430 Unspecified atrioventricular block: Secondary | ICD-10-CM | POA: Diagnosis not present

## 2018-03-04 DIAGNOSIS — I442 Atrioventricular block, complete: Secondary | ICD-10-CM | POA: Diagnosis not present

## 2018-03-04 LAB — CUP PACEART INCLINIC DEVICE CHECK
Battery Remaining Longevity: 82 mo
Battery Voltage: 2.98 V
Brady Statistic AP VP Percent: 0 %
Brady Statistic AP VS Percent: 0 %
Brady Statistic AS VP Percent: 0 %
Brady Statistic AS VS Percent: 0 %
Brady Statistic RA Percent Paced: 0 %
Brady Statistic RV Percent Paced: 99.92 %
Date Time Interrogation Session: 20190812125942
HighPow Impedance: 71 Ohm
Implantable Lead Implant Date: 20130715
Implantable Lead Implant Date: 20180614
Implantable Lead Implant Date: 20180614
Implantable Lead Location: 753858
Implantable Lead Location: 753859
Implantable Lead Location: 753860
Implantable Lead Model: 4598
Implantable Pulse Generator Implant Date: 20180614
Lead Channel Impedance Value: 188.1 Ohm
Lead Channel Impedance Value: 188.1 Ohm
Lead Channel Impedance Value: 188.1 Ohm
Lead Channel Impedance Value: 209 Ohm
Lead Channel Impedance Value: 209 Ohm
Lead Channel Impedance Value: 342 Ohm
Lead Channel Impedance Value: 399 Ohm
Lead Channel Impedance Value: 418 Ohm
Lead Channel Impedance Value: 418 Ohm
Lead Channel Impedance Value: 418 Ohm
Lead Channel Impedance Value: 475 Ohm
Lead Channel Impedance Value: 513 Ohm
Lead Channel Impedance Value: 532 Ohm
Lead Channel Impedance Value: 665 Ohm
Lead Channel Impedance Value: 665 Ohm
Lead Channel Impedance Value: 703 Ohm
Lead Channel Impedance Value: 722 Ohm
Lead Channel Impedance Value: 722 Ohm
Lead Channel Pacing Threshold Amplitude: 0.5 V
Lead Channel Pacing Threshold Amplitude: 0.625 V
Lead Channel Pacing Threshold Pulse Width: 0.4 ms
Lead Channel Pacing Threshold Pulse Width: 1 ms
Lead Channel Sensing Intrinsic Amplitude: 0.625 mV
Lead Channel Setting Pacing Amplitude: 1 V
Lead Channel Setting Pacing Amplitude: 2 V
Lead Channel Setting Pacing Pulse Width: 0.4 ms
Lead Channel Setting Pacing Pulse Width: 1 ms
Lead Channel Setting Sensing Sensitivity: 0.3 mV

## 2018-03-04 NOTE — Progress Notes (Signed)
Electrophysiology Office Note   Date:  03/04/2018   ID:  YEE JOSS, DOB [birth year], MRN [id number]  PCP:  Tammi Sou, MD  Cardiologist:  [place] Primary Electrophysiologist:  Joleen Stuckert Meredith Leeds, MD    No chief complaint on file.    History of Present Illness: Mason [name] is a 77 y.o. male who presents today for electrophysiology evaluation.   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 He showed a gradual decline in LV systolic function and has recently developed exertional dyspnea and heart failure symptoms. An echocardiogram in August showed an EF of 40% with diffuse hypokinesis. In October he had a nuclear stress test that showed all perfusion but an EF of 32%. Has minor coronary atherosclerosis and normal LV EDP. He had a dual chamber Medtronic Revo pacemaker implanted July 2013. Due to 100% V pacing and a drop in his EF, had CRT-D upgrade on 01/04/17.  Today, denies symptoms of palpitations, chest pain, shortness of breath, orthopnea, PND, lower extremity edema, claudication, presyncope, syncope, bleeding, or neurologic sequela. The patient is tolerating medications without difficulties.  Unfortunately, he has been weak and fatigued for the last few months.  He has had his device adjusted with more aggressive rate response which has not improved his symptoms.  He is also had dizziness that occurs both when he is laying down and standing up.  He has been diagnosed with vertigo and was put on medications.   Past Medical History:  Diagnosis Date  . AICD (automatic cardioverter/defibrillator) present 2018   MDT CRT-D.  Fatigue-->completely pacer dependent.  Pacer settings adjusted 12/2017 to allow more chronotrophc variance with ADL's//exertion.  . Arthritis    Pt is s/p eft reverse total shoulder arthroplasty.  [name] BPH (benign prostatic hypertrophy) 06/2011   Irritative sx's; pt declined trial of anticholinergic per Urology records  . Chronic combined  systolic and diastolic heart failure ([place]) 05/31/2012   Nonischemic:  EF 40-45%, LA mod-severe dilated, AFIB.   02/2016 EF 40%, diffuse hypokinesis, grade 2 DD.  Myoc perf imaging showed EF 32% 04/2016.  Pt upgraded to CRT-D 01/04/17.  [name] Chronic renal insufficiency, stage III (moderate) (HCC) 2015   CrCl about 60 ml/min  . Complete heart block (HCC)    Has dual chamber pacer.  . Depression   . DOE (dyspnea on exertion)    NYHA class II/III CHF  . Episodic low back pain 01/22/2013   w/intermittent radiculitis (12/2014 his neurologist referred him to pain mgmt for epidural steroid injection)  . Erectile dysfunction 2019   due to zoloft--urol rx'd viagra  . GERD (gastroesophageal reflux disease)   . H/O tilt table evaluation 11/02/05   negative  . Helicobacter pylori gastritis 01/2016  . High cholesterol   . History of adenomatous polyp of colon 10/12/11   Dr. [name] (3 right side of colon- tubular adenomas removed)  . History of cardiovascular stress test 05/28/12   no ischemia, EF 37%, imaging results are unchanged and within normal variance  . History of chronic prostatitis   . History of kidney stones   . History of vertigo    + Hx of posterior HA's.  Neuro (Dr. Erling Cruz) eval 2011.  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  . Hypertension   . Lumbar spondylosis    lumbosacral radiculopathy at L4 by EMG testing, right foot drop (neurologist is Dr. Linus Salmons with [facility] in [place])--neurologist referred him to neurosurgery  .  Migraine    "used to have them all the time; none for years" (01/04/2017)  . Myocardial infarction [facility]) ?1970s  . Nephrolithiasis 07/2012   Left UVJ 2 mm stone with dilation of renal collecting system and slight hydroureter on right  . Neuropathy   . 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  Since no CAD on cath 05/2016, cards  recommends upgrade to CRT-D.  [name] Permanent atrial fibrillation ([place])    DCCV 07/09/13-converted, lasted two days, then back into afib--needs lifetime anticoagulation (Xarelto as of 09/2014)  . Prostate cancer screening 09/2017   done by urol annually (normal prostate exam documented + PSA 0.84 as of 10/01/17 urol f/u.  [name] Rectus diastasis   . Right ankle sprain 08/2017   w/distal fibula avulsion fx noted on u/s but not plain film-(Dr. Hudnall).  . Skin cancer of arm, left    "burned it off" (01/04/2017)  . Type II diabetes mellitus ([place])    Past Surgical History:  Procedure Laterality Date  . BACK SURGERY    . BI-VENTRICULAR IMPLANTABLE CARDIOVERTER DEFIBRILLATOR  (CRT-D) Left 01/04/2017  . BIV ICD INSERTION CRT-D N/A 01/04/2017   Procedure: BiV ICD ;  Surgeon: Constance Haw, MD;  Location: [facility];  Service: Cardiovascular;  Laterality: N/A;  . CARDIAC CATHETERIZATION N/A 06/14/2016   Minimal nonobstructive dz, EF 25-35%.  Procedure: Left Heart Cath and Coronary Angiography;  Surgeon: Peter M Martinique, MD;  Location: [facility];  Service: Cardiovascular;  Laterality: N/A;  . CARDIOVASCULAR STRESS TEST  2012   2012 nuclear perfusion study: low risk scan; 04/2016 normal myocardial perfusion imaging, EF 32%.  [name] CARDIOVERSION  07/09/2012   Procedure: CARDIOVERSION;  Surgeon: [name], MD;  Location: [facility];  Service: Cardiovascular;  Laterality: N/A;  . CATARACT EXTRACTION W/ INTRAOCULAR LENS IMPLANT & ANTERIOR VITRECTOMY, BILATERAL Bilateral   . COLONOSCOPY W/ POLYPECTOMY  approx 2006; repeated 09/2011   Polyps on 2013 EGD as well, repeat 12/2014  . ESOPHAGOGASTRODUODENOSCOPY  10/18/06   Done due to chronic GERD: Normal, bx showed no barrett's esophagus (Dr. [name])  . FLEXOR TENDON REPAIR Left 10/02/2016   Procedure: LEFT RING FINGER WOUND EXPORATION AND FLEXOR TENDON REPAIR AND NERVE REPAIR;  Surgeon: Milly Jakob, MD;  Location: [facility];  Service: Orthopedics;   Laterality: Left;  . INSERT / REPLACE / REMOVE PACEMAKER  02/05/2012   dual chamber, sinus node dysfunction, sinus arrest, PAF, Medtronic Revo serial#[id number] H: last checked 05/2015  . LUMBAR LAMINECTOMY Left 1976   L4-5  . PACEMAKER REMOVAL  01/04/2017  . PERMANENT PACEMAKER INSERTION N/A 02/05/2012   Procedure: PERMANENT PACEMAKER INSERTION;  Surgeon: [name], MD; Generator Medtronic Revo model [place] serial number [id number] H Laterality: N/A;  . RETINAL DETACHMENT SURGERY Left ~ 1999  . REVERSE SHOULDER ARTHROPLASTY Left 2018   Left shoulder reverse [name] in [place]).  . TRANSTHORACIC ECHOCARDIOGRAM  08/25/10; 05/2012; 03/23/16   mild asymmetric LVH, normal systolic function, normal diastolic fxn, mild-to-mod mitral regurg, mild aortic valve sclerosis and trace AI, mild aortic root dilatation. 2014 f/u showed EF 40-45%, mod LAE, A FIB.  02/2016 EF 40%, diffuse hypokinesis, grade 2 DD.     Current Outpatient Medications  Medication Sig Dispense Refill  . acetaminophen (TYLENOL) 325 MG tablet Take 2 tablets (650 mg total) by mouth every 6 (six) hours as needed for mild pain or moderate pain.    [name] colchicine 0.6 MG tablet Take  0.6 mg by mouth daily as needed (for gout).     . COMBIGAN 0.2-0.5 % ophthalmic solution Place 1 drop into both eyes 2 (two) times daily.     [name] gabapentin (NEURONTIN) 300 MG capsule Take 300 mg by mouth 3 (three) times daily.    [name] glipiZIDE (GLUCOTROL) 10 MG tablet Take 10 mg by mouth 2 (two) times daily before a meal.    . glucose blood test strip Use to check blood sugars 1-2 times daily 100 each 3  . Lancets (FREESTYLE) lancets Use as instructed 100 each 6  . latanoprost (XALATAN) 0.005 % ophthalmic solution Place 1 drop into both eyes at bedtime.   12  . meclizine (ANTIVERT) 25 MG tablet Take 1 tablet (25 mg total) 3 (three) times daily as needed by mouth for dizziness. 270 tablet 3  . metoprolol succinate (TOPROL-XL) 100 MG 24 hr tablet Take  150 mg by mouth daily. Take with or immediately following a meal.    . omeprazole (PRILOSEC) 40 MG capsule Take 40 mg by mouth daily.    . rivaroxaban (XARELTO) 20 MG TABS tablet Take 20 mg by mouth daily with supper.    [name] rOPINIRole (REQUIP) 1 MG tablet Take 1 tablet (1 mg total) by mouth daily. 90 tablet 3  . sacubitril-valsartan (ENTRESTO) 97-103 MG Take 1 tablet by mouth 2 (two) times daily.    . sertraline (ZOLOFT) 100 MG tablet Take 100 mg by mouth daily.    . simvastatin (ZOCOR) 20 MG tablet Take 20 mg by mouth daily.    . sitaGLIPtin (JANUVIA) 100 MG tablet Take 100 mg by mouth daily.     No current facility-administered medications for this visit.     Allergies:   Patient has no known allergies.   Social History:  The patient  reports that he has never smoked. He has never used smokeless tobacco. He reports that he drinks alcohol. He reports that he does not use drugs.   Family History:  The patient's family history includes Heart failure in his mother; Stroke in his father and mother.    ROS:  Please see the history of present illness.   Otherwise, review of systems is positive for none.   All other systems are reviewed and negative.   PHYSICAL EXAM: VS:  BP 122/82   Pulse 78   Ht 5\' 11"  (1.803 m)   Wt 232 lb (105.2 kg)   SpO2 97%   BMI 32.36 kg/m  , BMI Body mass index is 32.36 kg/m. GEN: Well nourished, well developed, in no acute distress  HEENT: normal  Neck: no JVD, carotid bruits, or masses Cardiac: RRR; no murmurs, rubs, or gallops,no edema  Respiratory:  clear to auscultation bilaterally, normal work of breathing GI: soft, nontender, nondistended, + BS MS: no deformity or atrophy  Skin: warm and dry, device site well healed Neuro:  Strength and sensation are intact Psych: euthymic mood, full affect  EKG:  EKG is not ordered today. Personal review of the ekg ordered 01/18/18 shows AF, V paced  Personal review of the device interrogation today. Results in  [place]: 07/27/2017: ALT 14 02/25/2018: BUN 18; Creatinine, Ser 1.34; Potassium 5.1; Sodium 136    Lipid Panel     Component Value Date/Time   CHOL 134 07/27/2017 1158   TRIG 144.0 07/27/2017 1158   HDL 37.50 (L) 07/27/2017 1158   CHOLHDL 4 07/27/2017 1158   VLDL 28.8 07/27/2017 1158  LDLCALC 68 07/27/2017 1158     Wt Readings from Last 3 Encounters:  03/04/18 232 lb (105.2 kg)  02/06/18 227 lb 4 oz (103.1 kg)  01/23/18 228 lb (103.4 kg)      Other studies Reviewed: Additional studies/ records that were reviewed today include: Cardiac cath 06/14/16, TTE 03/23/16  Review of the above records today demonstrates:   There is severe left ventricular systolic dysfunction.  The left ventricular ejection fraction is 25-35% by visual estimate.  LV end diastolic pressure is normal.   1. Minimal nonobstructive CAD 2. Severe LV dysfunction 3. Normal LV EDP  - Left ventricle: The cavity size was normal. Wall thickness was   increased in a pattern of mild LVH. The estimated ejection   fraction was 40%. Diffuse hypokinesis. Features are consistent   with a pseudonormal left ventricular filling pattern, with   concomitant abnormal relaxation and increased filling pressure   (grade 2 diastolic dysfunction). - Aortic valve: There was no stenosis. - Aorta: Ascending aortic diameter: 43 mm (S). - Ascending aorta: The ascending aorta was mildly dilated. - Mitral valve: There was trivial regurgitation. - Left atrium: The atrium was mildly dilated. - Right ventricle: The cavity size was normal. Pacer wire or   catheter noted in right ventricle. Systolic function was mildly   reduced. - Right atrium: The atrium was mildly dilated. - Tricuspid valve: Peak RV-RA gradient (S): 26 mm Hg. - Pulmonary arteries: PA peak pressure: 34 mm Hg (S). - Systemic veins: IVC measured 2.0 cm with < 50% respirophasic   variation, suggesting RA pressure 8 mmHg.   ASSESSMENT AND  PLAN:  1.  Permanent atrial fibrillation: Anticoagulation with Xarelto  This patients CHA2DS2-VASc Score and unadjusted Ischemic Stroke Rate (% per year) is equal to 4.8 % stroke rate/year from a score of 4  Above score calculated as 1 point each if present [CHF, HTN, DM, Vascular=MI/PAD/Aortic Plaque, Age if 65-74, or Male] Above score calculated as 2 points each if present [Age > 75, or Stroke/TIA/TE]   2. Systolic heart failure: EF dropped after RV pacing.  He has had a CRT-D upgrade 01/04/2017.  He is continued to feel fatigued and short of breath.  He has had multiple adjustments made to his defibrillator without much improvement.  I discussed this with his primary cardiologist and we both feel that a right heart catheterization would potentially be beneficial to see if he has low output.  We Tyshun Tuckerman arrange with heart failure.  3. Complete AV block: Status post Medtronic CRT-D.  No changes.  4. Hypertension: Currently well controlled.  Current medicines are reviewed at length with the patient today.   The patient does not have concerns regarding his medicines.  The following changes were made today: None  Labs/ tests ordered today include:  No orders of the defined types were placed in this encounter.    Disposition:   FU with Jerrie Schussler 12 months  Signed, Sahiti Joswick Meredith Leeds, MD  03/04/2018 12:14 PM     [address] [place] [address] [phone] (office) [phone] (fax)

## 2018-03-04 NOTE — Patient Instructions (Signed)
Medication Instructions:  Your physician recommends that you continue on your current medications as directed. Please refer to the Current Medication list given to you today.  *If you need a refill on your cardiac medications before your next appointment, please call your pharmacy*  Labwork: None ordered  Testing/Procedures: None ordered  Follow-Up: Remote monitoring is used to monitor your Pacemaker or ICD from home. This monitoring reduces the number of office visits required to check your device to one time per year. It allows [place] to keep an eye on the functioning of your device to ensure it is working properly. You are scheduled for a device check from home on 04/23/2018. You may send your transmission at any time that day. If you have a wireless device, the transmission will be sent automatically. After your physician reviews your transmission, you will receive a postcard with your next transmission date.  Your physician wants you to follow-up in: 1 year with Dr. Curt Bears.  You will receive a reminder letter in the mail two months in advance. If you don't receive a letter, please call our office to schedule the follow-up appointment.  Thank you for choosing [facility]!!   Trinidad Curet, RN [phone]  Any Other Special Instructions Will Be Listed Below (If Applicable).

## 2018-03-06 ENCOUNTER — Other Ambulatory Visit: Payer: Self-pay | Admitting: Family Medicine

## 2018-03-06 ENCOUNTER — Telehealth (HOSPITAL_COMMUNITY): Payer: Self-pay | Admitting: *Deleted

## 2018-03-06 MED ORDER — GABAPENTIN 300 MG PO CAPS: 300.0000 mg | ORAL_CAPSULE | Freq: Three times a day (TID) | ORAL | 3 refills | Status: DC

## 2018-03-06 NOTE — Telephone Encounter (Signed)
Patient request refill of Gabapentin to be sent to [facility]

## 2018-03-06 NOTE — Telephone Encounter (Signed)
RF request for gabapentin LOV: 02/06/18 Next ov: 07/30/18 Last written: 11/13/16 #270 w/ 3Rf  Please advise. Thanks.

## 2018-03-06 NOTE — Telephone Encounter (Signed)
Per Dr Haroldine Laws pt needs R/L [facility] for hf per Dr C.  Attempted to call pt to schedule and Left message to call back

## 2018-03-07 NOTE — Telephone Encounter (Signed)
Left detailed message on home vm advising that Rx for gabapentin has been sent to pts pharmacy. Okay per DPR.

## 2018-03-12 ENCOUNTER — Encounter (HOSPITAL_COMMUNITY): Payer: Self-pay | Admitting: *Deleted

## 2018-03-12 ENCOUNTER — Other Ambulatory Visit (HOSPITAL_COMMUNITY): Payer: Self-pay | Admitting: *Deleted

## 2018-03-12 DIAGNOSIS — I5042 Chronic combined systolic (congestive) and diastolic (congestive) heart failure: Secondary | ICD-10-CM

## 2018-03-12 NOTE — Telephone Encounter (Signed)
Pt aware and agreeable with plan.  

## 2018-03-12 NOTE — Telephone Encounter (Signed)
Patient called back and gave him some dates to schedule R/L heart cath, I have him scheduled for August 26th @ 10:30 AM.  Hulen Skains patient back to give him instructions but had to leave VM.

## 2018-03-18 ENCOUNTER — Other Ambulatory Visit: Payer: Self-pay

## 2018-03-18 ENCOUNTER — Ambulatory Visit (HOSPITAL_COMMUNITY)
Admission: RE | Admit: 2018-03-18 | Discharge: 2018-03-18 | Disposition: A | Discharge status: Home / Self Care | Payer: Medicare Other | Source: Ambulatory Visit | Attending: Internal Medicine | Admitting: Internal Medicine

## 2018-03-18 DIAGNOSIS — I5042 Chronic combined systolic (congestive) and diastolic (congestive) heart failure: Secondary | ICD-10-CM

## 2018-03-18 DIAGNOSIS — I509 Heart failure, unspecified: Secondary | ICD-10-CM | POA: Insufficient documentation

## 2018-03-18 DIAGNOSIS — Z539 Procedure and treatment not carried out, unspecified reason: Secondary | ICD-10-CM | POA: Insufficient documentation

## 2018-03-18 LAB — BASIC METABOLIC PANEL
Anion gap: 8 (ref 5–15)
BUN: 13 mg/dL (ref 8–23)
CO2: 29 mmol/L (ref 22–32)
Calcium: 8.9 mg/dL (ref 8.9–10.3)
Chloride: 102 mmol/L (ref 98–111)
Creatinine, Ser: 1.34 mg/dL — ABNORMAL HIGH (ref 0.61–1.24)
GFR calc Af Amer: 58 mL/min — ABNORMAL LOW (ref >60)
GFR calc non Af Amer: 50 mL/min — ABNORMAL LOW (ref >60)
Glucose, Bld: 219 mg/dL — ABNORMAL HIGH (ref 70–99)
Potassium: 4.4 mmol/L (ref 3.5–5.1)
Sodium: 139 mmol/L (ref 135–145)

## 2018-03-18 LAB — CBC
HCT: 42.7 % (ref 39.0–52.0)
Hemoglobin: 13.9 g/dL (ref 13.0–17.0)
MCH: 29.8 pg (ref 26.0–34.0)
MCHC: 32.6 g/dL (ref 30.0–36.0)
MCV: 91.4 fL (ref 78.0–100.0)
Platelets: 137 10*3/uL — ABNORMAL LOW (ref 150–400)
RBC: 4.67 MIL/uL (ref 4.22–5.81)
RDW: 13.2 % (ref 11.5–15.5)
WBC: 4.3 10*3/uL (ref 4.0–10.5)

## 2018-03-18 LAB — GLUCOSE, CAPILLARY: Glucose-Capillary: 213 mg/dL — ABNORMAL HIGH (ref 70–99)

## 2018-03-18 LAB — PROTIME-INR
INR: 2.09
Prothrombin Time: 23.3 seconds — ABNORMAL HIGH (ref 11.4–15.2)

## 2018-03-18 MED ORDER — SODIUM CHLORIDE 0.9% FLUSH: 3.0000 mL | Freq: Two times a day (BID) | INTRAVENOUS | Status: DC

## 2018-03-18 MED ORDER — SODIUM CHLORIDE 0.9 % IV SOLN: 250.0000 mL | INTRAVENOUS | Status: DC | PRN

## 2018-03-18 MED ORDER — SODIUM CHLORIDE 0.9 % IV SOLN
INTRAVENOUS | Status: DC
  Administered 2018-03-18: 10:00:00 via INTRAVENOUS

## 2018-03-18 MED ORDER — ASPIRIN 81 MG PO CHEW
81.0000 mg | CHEWABLE_TABLET | ORAL | Status: AC
  Administered 2018-03-18: 81 mg via ORAL
  Filled 2018-03-18: qty 1

## 2018-03-18 MED ORDER — SODIUM CHLORIDE 0.9% FLUSH: 3.0000 mL | INTRAVENOUS | Status: DC | PRN

## 2018-03-22 ENCOUNTER — Encounter (HOSPITAL_COMMUNITY)
Admission: AD | Disposition: A | Discharge status: Home / Self Care | Payer: Self-pay | Source: Ambulatory Visit | Attending: Internal Medicine

## 2018-03-22 ENCOUNTER — Ambulatory Visit (HOSPITAL_COMMUNITY): Payer: Medicare Other

## 2018-03-22 ENCOUNTER — Other Ambulatory Visit: Payer: Self-pay

## 2018-03-22 ENCOUNTER — Ambulatory Visit (HOSPITAL_COMMUNITY): Admission: RE | Admit: 2018-03-22 | Payer: Medicare Other | Source: Ambulatory Visit | Admitting: Internal Medicine

## 2018-03-22 ENCOUNTER — Observation Stay (HOSPITAL_COMMUNITY)
Admission: AD | Admit: 2018-03-22 | Discharge: 2018-03-23 | Disposition: A | Discharge status: Home / Self Care | Payer: Medicare Other | Source: Ambulatory Visit | Attending: Internal Medicine | Admitting: Internal Medicine

## 2018-03-22 ENCOUNTER — Encounter (HOSPITAL_COMMUNITY): Admission: RE | Payer: Self-pay | Source: Ambulatory Visit

## 2018-03-22 DIAGNOSIS — E785 Hyperlipidemia, unspecified: Secondary | ICD-10-CM | POA: Diagnosis present

## 2018-03-22 DIAGNOSIS — E669 Obesity, unspecified: Secondary | ICD-10-CM

## 2018-03-22 DIAGNOSIS — I428 Other cardiomyopathies: Secondary | ICD-10-CM

## 2018-03-22 DIAGNOSIS — G9782 Other postprocedural complications and disorders of nervous system: Secondary | ICD-10-CM | POA: Diagnosis not present

## 2018-03-22 DIAGNOSIS — E1122 Type 2 diabetes mellitus with diabetic chronic kidney disease: Secondary | ICD-10-CM | POA: Diagnosis not present

## 2018-03-22 DIAGNOSIS — I429 Cardiomyopathy, unspecified: Secondary | ICD-10-CM | POA: Diagnosis not present

## 2018-03-22 DIAGNOSIS — I252 Old myocardial infarction: Secondary | ICD-10-CM | POA: Insufficient documentation

## 2018-03-22 DIAGNOSIS — G459 Transient cerebral ischemic attack, unspecified: Secondary | ICD-10-CM | POA: Diagnosis present

## 2018-03-22 DIAGNOSIS — I130 Hypertensive heart and chronic kidney disease with heart failure and stage 1 through stage 4 chronic kidney disease, or unspecified chronic kidney disease: Secondary | ICD-10-CM | POA: Insufficient documentation

## 2018-03-22 DIAGNOSIS — N400 Enlarged prostate without lower urinary tract symptoms: Secondary | ICD-10-CM | POA: Diagnosis not present

## 2018-03-22 DIAGNOSIS — Z7901 Long term (current) use of anticoagulants: Secondary | ICD-10-CM | POA: Insufficient documentation

## 2018-03-22 DIAGNOSIS — N183 Chronic kidney disease, stage 3 unspecified: Secondary | ICD-10-CM

## 2018-03-22 DIAGNOSIS — Z87442 Personal history of urinary calculi: Secondary | ICD-10-CM | POA: Diagnosis not present

## 2018-03-22 DIAGNOSIS — Z86010 Personal history of colonic polyps: Secondary | ICD-10-CM | POA: Insufficient documentation

## 2018-03-22 DIAGNOSIS — E1140 Type 2 diabetes mellitus with diabetic neuropathy, unspecified: Secondary | ICD-10-CM | POA: Diagnosis not present

## 2018-03-22 DIAGNOSIS — Z85828 Personal history of other malignant neoplasm of skin: Secondary | ICD-10-CM | POA: Insufficient documentation

## 2018-03-22 DIAGNOSIS — I2510 Atherosclerotic heart disease of native coronary artery without angina pectoris: Principal | ICD-10-CM | POA: Insufficient documentation

## 2018-03-22 DIAGNOSIS — M1990 Unspecified osteoarthritis, unspecified site: Secondary | ICD-10-CM | POA: Insufficient documentation

## 2018-03-22 DIAGNOSIS — I5022 Chronic systolic (congestive) heart failure: Secondary | ICD-10-CM | POA: Diagnosis present

## 2018-03-22 DIAGNOSIS — E1169 Type 2 diabetes mellitus with other specified complication: Secondary | ICD-10-CM

## 2018-03-22 DIAGNOSIS — I442 Atrioventricular block, complete: Secondary | ICD-10-CM | POA: Diagnosis not present

## 2018-03-22 DIAGNOSIS — I4821 Permanent atrial fibrillation: Secondary | ICD-10-CM | POA: Diagnosis present

## 2018-03-22 DIAGNOSIS — Z823 Family history of stroke: Secondary | ICD-10-CM | POA: Diagnosis not present

## 2018-03-22 DIAGNOSIS — Z8249 Family history of ischemic heart disease and other diseases of the circulatory system: Secondary | ICD-10-CM | POA: Diagnosis not present

## 2018-03-22 DIAGNOSIS — Z950 Presence of cardiac pacemaker: Secondary | ICD-10-CM | POA: Diagnosis present

## 2018-03-22 DIAGNOSIS — I482 Chronic atrial fibrillation: Secondary | ICD-10-CM

## 2018-03-22 DIAGNOSIS — Z79899 Other long term (current) drug therapy: Secondary | ICD-10-CM | POA: Insufficient documentation

## 2018-03-22 DIAGNOSIS — Z9889 Other specified postprocedural states: Secondary | ICD-10-CM | POA: Diagnosis not present

## 2018-03-22 DIAGNOSIS — R531 Weakness: Secondary | ICD-10-CM | POA: Diagnosis not present

## 2018-03-22 DIAGNOSIS — Z95810 Presence of automatic (implantable) cardiac defibrillator: Secondary | ICD-10-CM | POA: Insufficient documentation

## 2018-03-22 DIAGNOSIS — N529 Male erectile dysfunction, unspecified: Secondary | ICD-10-CM | POA: Insufficient documentation

## 2018-03-22 DIAGNOSIS — I10 Essential (primary) hypertension: Secondary | ICD-10-CM | POA: Diagnosis present

## 2018-03-22 HISTORY — DX: Transient cerebral ischemic attack, unspecified: G45.9

## 2018-03-22 HISTORY — PX: RIGHT/LEFT HEART CATH AND CORONARY ANGIOGRAPHY: CATH118266

## 2018-03-22 HISTORY — DX: Other cardiomyopathies: I42.8

## 2018-03-22 LAB — POCT I-STAT 3, VENOUS BLOOD GAS (G3P V)
Acid-base deficit: 1 mmol/L (ref 0.0–2.0)
Bicarbonate: 25.5 mmol/L (ref 20.0–28.0)
Bicarbonate: 27 mmol/L (ref 20.0–28.0)
O2 Saturation: 62 %
O2 Saturation: 62 %
TCO2: 27 mmol/L (ref 22–32)
TCO2: 29 mmol/L (ref 22–32)
pCO2, Ven: 50.7 mmHg (ref 44.0–60.0)
pCO2, Ven: 53.9 mmHg (ref 44.0–60.0)
pH, Ven: 7.308 (ref 7.250–7.430)
pH, Ven: 7.309 (ref 7.250–7.430)
pO2, Ven: 36 mmHg (ref 32.0–45.0)
pO2, Ven: 36 mmHg (ref 32.0–45.0)

## 2018-03-22 LAB — GLUCOSE, CAPILLARY
Glucose-Capillary: 211 mg/dL — ABNORMAL HIGH (ref 70–99)
Glucose-Capillary: 235 mg/dL — ABNORMAL HIGH (ref 70–99)
Glucose-Capillary: 265 mg/dL — ABNORMAL HIGH (ref 70–99)
Glucose-Capillary: 221 mg/dL — ABNORMAL HIGH (ref 70–99)

## 2018-03-22 LAB — POCT I-STAT 3, ART BLOOD GAS (G3+)
Bicarbonate: 25.7 mmol/L (ref 20.0–28.0)
O2 Saturation: 98 %
TCO2: 27 mmol/L (ref 22–32)
pCO2 arterial: 47 mmHg (ref 32.0–48.0)
pH, Arterial: 7.347 — ABNORMAL LOW (ref 7.350–7.450)
pO2, Arterial: 106 mmHg (ref 83.0–108.0)

## 2018-03-22 LAB — MRSA PCR SCREENING: MRSA by PCR: NEGATIVE

## 2018-03-22 SURGERY — RIGHT/LEFT HEART CATH AND CORONARY ANGIOGRAPHY
Anesthesia: LOCAL

## 2018-03-22 MED ORDER — SIMVASTATIN 20 MG PO TABS
20.0000 mg | ORAL_TABLET | Freq: Every day | ORAL | Status: DC
  Administered 2018-03-22: 20 mg via ORAL
  Filled 2018-03-22: qty 1

## 2018-03-22 MED ORDER — SODIUM CHLORIDE 0.9 % IV SOLN
INTRAVENOUS | Status: DC
  Administered 2018-03-22: 11:00:00 via INTRAVENOUS

## 2018-03-22 MED ORDER — ONDANSETRON HCL 4 MG/2ML IJ SOLN: 4.0000 mg | Freq: Four times a day (QID) | INTRAMUSCULAR | Status: DC | PRN

## 2018-03-22 MED ORDER — FENTANYL CITRATE (PF) 100 MCG/2ML IJ SOLN
INTRAMUSCULAR | Status: AC
  Filled 2018-03-22: qty 2

## 2018-03-22 MED ORDER — SODIUM CHLORIDE 0.9 % IV SOLN: 250.0000 mL | INTRAVENOUS | Status: DC | PRN

## 2018-03-22 MED ORDER — MIDAZOLAM HCL 2 MG/2ML IJ SOLN
INTRAMUSCULAR | Status: DC | PRN
  Administered 2018-03-22 (×2): 1 mg via INTRAVENOUS

## 2018-03-22 MED ORDER — IOHEXOL 350 MG/ML SOLN
INTRAVENOUS | Status: DC | PRN
  Administered 2018-03-22: 65 mL via INTRACARDIAC

## 2018-03-22 MED ORDER — VERAPAMIL HCL 2.5 MG/ML IV SOLN
INTRAVENOUS | Status: DC | PRN
  Administered 2018-03-22 (×2): 10 mL via INTRA_ARTERIAL

## 2018-03-22 MED ORDER — SERTRALINE HCL 100 MG PO TABS
100.0000 mg | ORAL_TABLET | Freq: Every day | ORAL | Status: DC
  Administered 2018-03-23: 100 mg via ORAL
  Filled 2018-03-22: qty 1

## 2018-03-22 MED ORDER — ACETAMINOPHEN 325 MG PO TABS: 650.0000 mg | ORAL_TABLET | ORAL | Status: DC | PRN

## 2018-03-22 MED ORDER — MIDAZOLAM HCL 2 MG/2ML IJ SOLN
INTRAMUSCULAR | Status: AC
  Filled 2018-03-22: qty 2

## 2018-03-22 MED ORDER — ROPINIROLE HCL 1 MG PO TABS
1.0000 mg | ORAL_TABLET | Freq: Every day | ORAL | Status: DC
  Administered 2018-03-22: 1 mg via ORAL
  Filled 2018-03-22: qty 1

## 2018-03-22 MED ORDER — LIDOCAINE HCL (PF) 1 % IJ SOLN
INTRAMUSCULAR | Status: AC
  Filled 2018-03-22: qty 30

## 2018-03-22 MED ORDER — PANTOPRAZOLE SODIUM 40 MG PO TBEC
40.0000 mg | DELAYED_RELEASE_TABLET | Freq: Every day | ORAL | Status: DC
  Administered 2018-03-22 – 2018-03-23 (×2): 40 mg via ORAL
  Filled 2018-03-22 (×2): qty 1

## 2018-03-22 MED ORDER — HEPARIN (PORCINE) IN NACL 1000-0.9 UT/500ML-% IV SOLN
INTRAVENOUS | Status: AC
  Filled 2018-03-22: qty 1000

## 2018-03-22 MED ORDER — SODIUM CHLORIDE 0.9 % IV SOLN
INTRAVENOUS | Status: DC
  Administered 2018-03-22: 19:00:00 via INTRAVENOUS

## 2018-03-22 MED ORDER — FENTANYL CITRATE (PF) 100 MCG/2ML IJ SOLN
INTRAMUSCULAR | Status: DC | PRN
  Administered 2018-03-22 (×2): 25 ug via INTRAVENOUS

## 2018-03-22 MED ORDER — LATANOPROST 0.005 % OP SOLN
1.0000 [drp] | Freq: Every day | OPHTHALMIC | Status: DC
  Administered 2018-03-22: 1 [drp] via OPHTHALMIC
  Filled 2018-03-22: qty 2.5

## 2018-03-22 MED ORDER — SODIUM CHLORIDE 0.9% FLUSH
3.0000 mL | INTRAVENOUS | Status: DC | PRN
  Administered 2018-03-22: 3 mL via INTRAVENOUS
  Filled 2018-03-22: qty 3

## 2018-03-22 MED ORDER — SODIUM CHLORIDE 0.9% FLUSH: 3.0000 mL | INTRAVENOUS | Status: DC | PRN

## 2018-03-22 MED ORDER — METOPROLOL SUCCINATE ER 50 MG PO TB24
150.0000 mg | ORAL_TABLET | Freq: Every day | ORAL | Status: DC
  Administered 2018-03-23: 150 mg via ORAL
  Filled 2018-03-22: qty 1

## 2018-03-22 MED ORDER — GABAPENTIN 600 MG PO TABS
600.0000 mg | ORAL_TABLET | Freq: Every day | ORAL | Status: DC
  Administered 2018-03-23: 600 mg via ORAL
  Filled 2018-03-22: qty 1

## 2018-03-22 MED ORDER — ASPIRIN 81 MG PO CHEW
81.0000 mg | CHEWABLE_TABLET | ORAL | Status: AC
  Administered 2018-03-22: 81 mg via ORAL
  Filled 2018-03-22: qty 1

## 2018-03-22 MED ORDER — LIDOCAINE HCL (PF) 1 % IJ SOLN
INTRAMUSCULAR | Status: DC | PRN
  Administered 2018-03-22: 2 mL
  Administered 2018-03-22: 1 mL
  Administered 2018-03-22: 2 mL

## 2018-03-22 MED ORDER — GABAPENTIN 300 MG PO CAPS
300.0000 mg | ORAL_CAPSULE | Freq: Every day | ORAL | Status: DC
  Administered 2018-03-22: 300 mg via ORAL
  Filled 2018-03-22: qty 1

## 2018-03-22 MED ORDER — BRIMONIDINE TARTRATE-TIMOLOL 0.2-0.5 % OP SOLN
1.0000 [drp] | Freq: Every day | OPHTHALMIC | Status: DC
  Filled 2018-03-22: qty 5

## 2018-03-22 MED ORDER — HEPARIN (PORCINE) IN NACL 1000-0.9 UT/500ML-% IV SOLN
INTRAVENOUS | Status: DC | PRN
  Administered 2018-03-22 (×2): 500 mL

## 2018-03-22 MED ORDER — SODIUM CHLORIDE 0.9% FLUSH: 3.0000 mL | Freq: Two times a day (BID) | INTRAVENOUS | Status: DC

## 2018-03-22 MED ORDER — TIMOLOL MALEATE 0.5 % OP SOLN
1.0000 [drp] | Freq: Every day | OPHTHALMIC | Status: DC
  Administered 2018-03-22: 1 [drp] via OPHTHALMIC
  Filled 2018-03-22: qty 5

## 2018-03-22 MED ORDER — RIVAROXABAN 20 MG PO TABS
20.0000 mg | ORAL_TABLET | Freq: Every day | ORAL | Status: DC
  Administered 2018-03-22: 20 mg via ORAL
  Filled 2018-03-22: qty 1

## 2018-03-22 MED ORDER — SODIUM CHLORIDE 0.9% FLUSH
3.0000 mL | Freq: Two times a day (BID) | INTRAVENOUS | Status: DC
  Administered 2018-03-22: 3 mL via INTRAVENOUS

## 2018-03-22 MED ORDER — VERAPAMIL HCL 2.5 MG/ML IV SOLN
INTRAVENOUS | Status: AC
  Filled 2018-03-22: qty 2

## 2018-03-22 MED ORDER — INSULIN ASPART 100 UNIT/ML ~~LOC~~ SOLN
0.0000 [IU] | Freq: Every day | SUBCUTANEOUS | Status: DC
  Administered 2018-03-22: 2 [IU] via SUBCUTANEOUS

## 2018-03-22 MED ORDER — GABAPENTIN 300 MG PO CAPS: 300.0000 mg | ORAL_CAPSULE | Freq: Three times a day (TID) | ORAL | Status: DC

## 2018-03-22 MED ORDER — SODIUM CHLORIDE 0.9% FLUSH
3.0000 mL | Freq: Two times a day (BID) | INTRAVENOUS | Status: DC
  Administered 2018-03-22 – 2018-03-23 (×2): 3 mL via INTRAVENOUS

## 2018-03-22 MED ORDER — INSULIN ASPART 100 UNIT/ML ~~LOC~~ SOLN
0.0000 [IU] | Freq: Three times a day (TID) | SUBCUTANEOUS | Status: DC
  Administered 2018-03-22: 8 [IU] via SUBCUTANEOUS
  Administered 2018-03-23: 3 [IU] via SUBCUTANEOUS

## 2018-03-22 MED ORDER — BRIMONIDINE TARTRATE 0.2 % OP SOLN
1.0000 [drp] | Freq: Every day | OPHTHALMIC | Status: DC
  Administered 2018-03-22: 1 [drp] via OPHTHALMIC
  Filled 2018-03-22: qty 5

## 2018-03-22 SURGICAL SUPPLY — 16 items
CATH BALLN WEDGE 5F 110CM (CATHETERS) ×2 IMPLANT
CATH INFINITI 4FR JL 4.0 (CATHETERS) ×1 IMPLANT
CATH INFINITI 4FR JL3.5 (CATHETERS) ×1 IMPLANT
CATH INFINITI 5 FR JL3.5 (CATHETERS) ×1 IMPLANT
CATH INFINITI 5FR JL5 (CATHETERS) ×1 IMPLANT
CATH INFINITI JR4 5F (CATHETERS) ×1 IMPLANT
DEVICE RAD COMP TR BAND LRG (VASCULAR PRODUCTS) ×1 IMPLANT
GLIDESHEATH SLEND SS 6F .021 (SHEATH) ×2 IMPLANT
GUIDEWIRE INQWIRE 1.5J.035X260 (WIRE) IMPLANT
INQWIRE 1.5J .035X260CM (WIRE) ×2
KIT HEART LEFT (KITS) ×2 IMPLANT
PACK CARDIAC CATHETERIZATION (CUSTOM PROCEDURE TRAY) ×2 IMPLANT
SHEATH GLIDE SLENDER 4/5FR (SHEATH) ×1 IMPLANT
SHEATH PROBE COVER 6X72 (BAG) ×1 IMPLANT
TRANSDUCER W/STOPCOCK (MISCELLANEOUS) ×2 IMPLANT
WIRE HITORQ VERSACORE ST 145CM (WIRE) ×2 IMPLANT

## 2018-03-22 NOTE — Code Documentation (Signed)
Patient status post cardiac cath  LKW at 1315.  Upon arrival to Short Stay he was unable to stand up left arm flaccid and facial droop.  Code Stroke called at 1428. Placed pt flat on stretcher. Dr Haroldine Laws at bedside.  Stat head CT done.  Patient rapidly improving.  NIHSS now 0.  Dr Aroor at bedside during [place].   TIA alert:  Neuro check and VS q2 hours. Plan admit as observation to [place]. RN to call a code stroke if symptoms return.

## 2018-03-22 NOTE — Consult Note (Addendum)
NEURO HOSPITALIST      Requesting Physician: Dr. Haroldine Laws    Chief Complaint:  Left arm flaccid  History obtained from:  Patient     HPI:                                                                                                                                         CYLER KAPPES is an 77 y.o. male  With PMH significant for DM 2, a fib ( on Xarelto), HTN,  HLD, Heart block ( pacemaker implanted) who presented to [facility] today for a cardiac cath about 30 minutes after the catheterization was completed a code stroke was called for flaccid left arm.  Per patient and short stay nurse patient left arm was flaccid. He was unable to move his left arm and he was unable to walk. Code stroke was activated and symptoms had resolved prior to CT.  Patient stated that his last dose of xarelto was Tuesday 03/19/18. Denies any CP, SOB, vision changes.   ED course:  Ct Head: no acute intracranial abnormality  No prior stroke history   Date last known well: Date: 03/22/2018 Time last known well: Time: 14:00 tPA Given: No: too mild to treat   Modified Rankin: Rankin Score=0  NIHSS: 0 1a Level of Conscious:0 1b LOC Questions:0  1c LOC Commands: 0 2 Best Gaze: 0 3 Visual: 0 4 Facial Palsy: 1 5a Motor Arm - left: 0 5b Motor Arm - Right: 0 6a Motor Leg - Left: 0 6b Motor Leg - Right: 0 7 Limb Ataxia: 0 8 Sensory: 0 9 Best Language: 0 10 Dysarthria:0 11 Extinct. and Inattention:0 TOTAL: 0      Past Medical History:  Diagnosis Date  . AICD (automatic cardioverter/defibrillator) present 2018   MDT CRT-D.  Fatigue-->completely pacer dependent.  Pacer settings adjusted 12/2017 to allow more chronotrophc variance with ADL's//exertion.  . Arthritis    Pt is s/p eft reverse total shoulder arthroplasty.  [name] BPH (benign prostatic hypertrophy) 06/2011   Irritative sx's; pt declined trial of anticholinergic per Urology records  . Chronic  combined systolic and diastolic heart failure ([place]) 05/31/2012   Nonischemic:  EF 40-45%, LA mod-severe dilated, AFIB.   02/2016 EF 40%, diffuse hypokinesis, grade 2 DD.  Myoc perf imaging showed EF 32% 04/2016.  Pt upgraded to CRT-D 01/04/17.  [name] Chronic renal insufficiency, stage III (moderate) (HCC) 2015   CrCl about 60 ml/min  . Complete heart block (HCC)    Has dual chamber pacer.  . Depression   . DOE (dyspnea on exertion)    NYHA class II/III CHF  . Episodic  low back pain 01/22/2013   w/intermittent radiculitis (12/2014 his neurologist referred him to pain mgmt for epidural steroid injection)  . Erectile dysfunction 2019   due to zoloft--urol rx'd viagra  . GERD (gastroesophageal reflux disease)   . H/O tilt table evaluation 11/02/05   negative  . Helicobacter pylori gastritis 01/2016  . High cholesterol   . History of adenomatous polyp of colon 10/12/11   Dr. [name] (3 right side of colon- tubular adenomas removed)  . History of cardiovascular stress test 05/28/12   no ischemia, EF 37%, imaging results are unchanged and within normal variance  . History of chronic prostatitis   . History of kidney stones   . History of vertigo    + Hx of posterior HA's.  Neuro (Dr. Erling Cruz) eval 2011.  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  . Hypertension   . Lumbar spondylosis    lumbosacral radiculopathy at L4 by EMG testing, right foot drop (neurologist is Dr. Linus Salmons with [facility] in [place])--neurologist referred him to neurosurgery  . Migraine    "used to have them all the time; none for years" (01/04/2017)  . Myocardial infarction [facility]) ?1970s  . Nephrolithiasis 07/2012   Left UVJ 2 mm stone with dilation of renal collecting system and slight hydroureter on right  . Neuropathy   . 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  Since no CAD on cath  05/2016, cards recommends upgrade to CRT-D.  [name] Permanent atrial fibrillation ([place])    DCCV 07/09/13-converted, lasted two days, then back into afib--needs lifetime anticoagulation (Xarelto as of 09/2014)  . Prostate cancer screening 09/2017   done by urol annually (normal prostate exam documented + PSA 0.84 as of 10/01/17 urol f/u.  [name] Rectus diastasis   . Right ankle sprain 08/2017   w/distal fibula avulsion fx noted on u/s but not plain film-(Dr. Hudnall).  . Skin cancer of arm, left    "burned it off" (01/04/2017)  . Type II diabetes mellitus ([place])     Past Surgical History:  Procedure Laterality Date  . BACK SURGERY    . BI-VENTRICULAR IMPLANTABLE CARDIOVERTER DEFIBRILLATOR  (CRT-D) Left 01/04/2017  . BIV ICD INSERTION CRT-D N/A 01/04/2017   Procedure: BiV ICD ;  Surgeon: Constance Haw, MD;  Location: [facility];  Service: Cardiovascular;  Laterality: N/A;  . CARDIAC CATHETERIZATION N/A 06/14/2016   Minimal nonobstructive dz, EF 25-35%.  Procedure: Left Heart Cath and Coronary Angiography;  Surgeon: Peter M Martinique, MD;  Location: [facility];  Service: Cardiovascular;  Laterality: N/A;  . CARDIOVASCULAR STRESS TEST  2012   2012 nuclear perfusion study: low risk scan; 04/2016 normal myocardial perfusion imaging, EF 32%.  [name] CARDIOVERSION  07/09/2012   Procedure: CARDIOVERSION;  Surgeon: [name], MD;  Location: [place];  Service: Cardiovascular;  Laterality: N/A;  . CATARACT EXTRACTION W/ INTRAOCULAR LENS IMPLANT & ANTERIOR VITRECTOMY, BILATERAL Bilateral   . COLONOSCOPY W/ POLYPECTOMY  approx 2006; repeated 09/2011   Polyps on 2013 EGD as well, repeat 12/2014  . ESOPHAGOGASTRODUODENOSCOPY  10/18/06   Done due to chronic GERD: Normal, bx showed no barrett's esophagus (Dr. [name])  . FLEXOR TENDON REPAIR Left 10/02/2016   Procedure: LEFT RING FINGER WOUND EXPORATION AND FLEXOR TENDON REPAIR AND NERVE REPAIR;  Surgeon: Milly Jakob, MD;  Location: [facility];   Service: Orthopedics;  Laterality: Left;  . INSERT / REPLACE /  REMOVE PACEMAKER  02/05/2012   dual chamber, sinus node dysfunction, sinus arrest, PAF, Medtronic Revo serial#-[id number] H: last checked 05/2015  . LUMBAR LAMINECTOMY Left 1976   L4-5  . PACEMAKER REMOVAL  01/04/2017  . PERMANENT PACEMAKER INSERTION N/A 02/05/2012   Procedure: PERMANENT PACEMAKER INSERTION;  Surgeon: [name], MD; Generator Medtronic Revo model [place] serial number [id number] H Laterality: N/A;  . RETINAL DETACHMENT SURGERY Left ~ 1999  . REVERSE SHOULDER ARTHROPLASTY Left 2018   Left shoulder reverse [name] in [place]).  . TRANSTHORACIC ECHOCARDIOGRAM  08/25/10; 05/2012; 03/23/16   mild asymmetric LVH, normal systolic function, normal diastolic fxn, mild-to-mod mitral regurg, mild aortic valve sclerosis and trace AI, mild aortic root dilatation. 2014 f/u showed EF 40-45%, mod LAE, A FIB.  02/2016 EF 40%, diffuse hypokinesis, grade 2 DD.    Family History  Problem Relation Age of Onset  . Heart failure Mother   . Stroke Mother   . Stroke Father          Social History:  reports that he has never smoked. He has never used smokeless tobacco. He reports that he drinks alcohol. He reports that he does not use drugs.  Allergies: No Known Allergies  Medications:                                                                                                                           Scheduled: . insulin aspart  0-15 Units Subcutaneous TID WC  . insulin aspart  0-5 Units Subcutaneous QHS  . sodium chloride flush  3 mL Intravenous Q12H   Continuous: . sodium chloride     NWG:NFAOZH chloride, acetaminophen, ondansetron (ZOFRAN) IV, sodium chloride flush   ROS:                                                                                                                                       History obtained from the patient  General ROS: negative for - chills, fatigue, fever, night sweats,  weight gain or weight loss Ophthalmic ROS: negative for - blurry vision, double vision, eye pain or loss of vision Respiratory ROS: negative for - cough,  shortness of breath or wheezing Cardiovascular ROS: negative for - chest pain, dyspnea on exertion,  Musculoskeletal ROS: positive for - left side muscular weakness Neurological ROS: as noted in HPI   General Examination:  Blood pressure 118/81, pulse 68, temperature 98 F (36.7 C), temperature source Oral, resp. rate 16, height 5\' 11"  (1.803 m), weight 103 kg, SpO2 97 %.  HEENT-  Normocephalic, no lesions, without obvious abnormality.  Normal external eye and conjunctiva.  Cardiovascular-  pulses palpable throughout   Lungs-no rhonchi or wheezing noted, no excessive working breathing.  Saturations within normal limits on RA Extremities- Warm, dry and intact Musculoskeletal-no joint tenderness, deformity or swelling Skin-warm and dry, no hyperpigmentation, vitiligo, or suspicious lesions  Neurological Examination Mental Status: Alert, oriented, thought content appropriate.  Speech fluent without evidence of aphasia.  Able to follow  commands without difficulty. Cranial Nerves: II:  Visual fields grossly normal,  III,IV, VI: ptosis not present, extra-ocular motions intact bilaterally, pupils equal, round, reactive to light and accommodation V,VII: smile asymmetric, light facial droop noted,  facial light touch sensation normal bilaterally VIII: hearing normal bilaterally IX,X: uvula rises symmetrically XI: bilateral shoulder shrug XII: midline tongue extension Motor: Right : Upper extremity   5/5    Left:     Upper extremity   5/5  Lower extremity   5/5     Lower extremity   5/5 Tone and bulk:normal tone throughout; no atrophy noted Sensory: light touch intact throughout, bilaterally Deep Tendon Reflexes: 2+ and symmetric biceps and  patella Plantars: Right: downgoing   Left: downgoing Cerebellar: normal finger-to-nose, normal rapid alternating movements and normal heel-to-shin test Gait: deferred   Lab Results: Basic Metabolic Panel: Recent Labs  Lab 03/18/18 0918  NA 139  K 4.4  CL 102  CO2 29  GLUCOSE 219*  BUN 13  CREATININE 1.34*  CALCIUM 8.9    CBC: Recent Labs  Lab 03/18/18 0918  WBC 4.3  HGB 13.9  HCT 42.7  MCV 91.4  PLT 137*    Lipid Panel: No results for input(s): CHOL, TRIG, HDL, CHOLHDL, VLDL, LDLCALC in the last 168 hours.  CBG: Recent Labs  Lab 03/18/18 0845 03/22/18 0949  GLUCAP 213* 211*    Imaging: Ct Head Code Stroke Wo Contrast`  Result Date: 03/22/2018 CLINICAL DATA:  Code stroke. Left-sided weakness after cardiac catheterization EXAM: CT HEAD WITHOUT CONTRAST TECHNIQUE: Contiguous axial images were obtained from the base of the skull through the vertex without intravenous contrast. COMPARISON:  01/16/2010 FINDINGS: Brain: No evidence of acute infarction, hemorrhage, hydrocephalus, extra-axial collection or mass lesion/mass effect. Moderate low-density in the cerebral white matter attributed to chronic small vessel ischemia. Cerebral volume loss that is generalized. Vascular: Atherosclerotic calcification. There is also occasional venous gas within the plexus at the foramen magnum and cavernous sinus region. Detection of hyperdense vessel is limited in the setting of recent IV contrast. Skull: No acute finding Sinuses/Orbits: Bilateral cataract resection and left scleral band. Other: These results were communicated to Dr. Lorraine Lax at 2:49 pmon 8/30/2019by text page via the [facility] messaging system. ASPECTS [facility] Stroke Program Early CT Score) - Ganglionic level infarction (caudate, lentiform nuclei, internal capsule, insula, M1-M3 cortex): 7 - Supraganglionic infarction (M4-M6 cortex): 3 Total score (0-10 with 10 being normal): 10 IMPRESSION: 1. No acute finding.ASPECTS is 10. 2.  Chronic small vessel ischemia. Electronically Signed   By: Monte Fantasia M.D.   On: 03/22/2018 14:50       Laurey Morale, MSN, NP-C [facility] [phone]  03/22/2018, 3:51 PM   Attending physician note to follow with Assessment and plan .   Assessment: 77 y.o. male With PMH significant for DM 2, a fib ( on Xarelto), HTN,  HLD, Heart block ( pacemaker implanted) who presented to [facility] today for a cardiac cath about 30 minutes after the catheterization was completed a code stroke was called for flaccid left arm. CT Head: no acute finding. Given transient symptoms most likely TIA related to cardiac cath.  Stroke Risk Factors - atrial fibrillation, diabetes mellitus, hyperlipidemia and hypertension  Acute Ischemic Stroke vs TIA  Recommend # Admit for observation  # Repeat CT head tomorrow #carotid doppler  #Transthoracic Echo  # Continue Xarelto  #Start or continue Atorvastatin 80 mg/other high intensity statin # BP goal: permissive HTN upto 220/120 mmHg  # HBAIC and Lipid profile # Telemetry monitoring # Frequent neuro checks # stroke swallow screen  Please page stroke NP  Or  PA  Or MD from 8am -4 pm  as this patient from this time will be  followed by the stroke.   You can look them up on [email or web address]  Password [email or web address]    NEUROHOSPITALIST ADDENDUM Performed a face to face diagnostic evaluation.   I have reviewed the contents of history and physical exam as documented by PA/ARNP/Resident and agree with above documentation.  I have discussed and formulated the above plan as documented. Edits to the note have been made as needed.  Impression: Possible small stroke vs TIA as a complication from the procedure during Catheter manipulation.  Patient has improved and close to baseline.  Therefore not a candidate for TPA as symptoms are too mild and resolving.  Patient on Xarelto was held for angiogram.  Resume Xarelto if no contraindication from cardiology. Key exam  findings: No weakness, sensory symptoms. Plan: Detailed above     Karena Addison Aroor MD [facility] [phone]   If 7pm to 7am, please call on call as listed on AMION.

## 2018-03-22 NOTE — H&P (Signed)
ADVANCED HEART FAILURE TEAM H&P   Date:  03/22/2018   ID:  Mason [name], DOB 17-Jun-1941, MRN [id number]  PCP:  Mason Sou, Mason [name]  Cardiologist:  Mason [name] Primary Electrophysiologist:  Mason [name]    Reason for admission: Post-cath TIA   History of Present Illness:  Mason [name] is a 77 y.o. 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   He has been followed by Mason [name] and Mason [name]. He showed a gradual decline in LV systolic function. An echocardiogram in August 2019 showed an EF of 40% with diffuse hypokinesis. In October he had a nuclear stress test that showed all perfusion but an EF of 32%. Has minor coronary atherosclerosis and normal LV EDP on previous cath in 2017. He had a dual chamber Medtronic Revo pacemaker implanted July 2013. Due to 100% V pacing and a drop in his EF, had CRT-D upgrade on 01/04/17.  Over last few months has had progressive NYHA III HF symptoms with exertional fatigue and shortness of breath. He was referred for outpatient cath today. Last dose of Xarelto was on Tuesday.  Underwent R/L cath which showed EF 30-35% with minimal CAD and well preserved hemodynamics. Cath was difficult case due to bilateral radial loops and dilated aortic root but able to be completed from left radial .  On arriving to short stay post-cath, patient developed acute onset of LUE weakness and L facial droop. Initially unable to lift arm at all. Code stroke activated. By the time patient taken for emergent CT all deficits essentially resolved. Head CT negative. Seen by stroke team who felt he had very distal embolus and with symptom resolution suggested 23-hour observation with frequent neuro checks and resumption of Xarelto tonight.    Past Medical History:  Diagnosis Date  . AICD (automatic cardioverter/defibrillator) present 2018   MDT CRT-D.  Fatigue-->completely pacer dependent.  Pacer settings  adjusted 12/2017 to allow more chronotrophc variance with ADL's//exertion.  . Arthritis    Pt is s/p eft reverse total shoulder arthroplasty.  [name] BPH (benign prostatic hypertrophy) 06/2011   Irritative sx's; pt declined trial of anticholinergic per Urology records  . Chronic combined systolic and diastolic heart failure ([place]) 05/31/2012   Nonischemic:  EF 40-45%, LA mod-severe dilated, AFIB.   02/2016 EF 40%, diffuse hypokinesis, grade 2 DD.  Myoc perf imaging showed EF 32% 04/2016.  Pt upgraded to CRT-D 01/04/17.  [name] Chronic renal insufficiency, stage III (moderate) (HCC) 2015   CrCl about 60 ml/min  . Complete heart block (HCC)    Has dual chamber pacer.  . Depression   . DOE (dyspnea on exertion)    NYHA class II/III CHF  . Episodic low back pain 01/22/2013   w/intermittent radiculitis (12/2014 his neurologist referred him to pain mgmt for epidural steroid injection)  . Erectile dysfunction 2019   due to zoloft--urol rx'd viagra  . GERD (gastroesophageal reflux disease)   . H/O tilt table evaluation 11/02/05   negative  . Helicobacter pylori gastritis 01/2016  . High cholesterol   . History of adenomatous polyp of colon 10/12/11   Dr. Benson [name] (3 right side of colon- tubular adenomas removed)  . History of cardiovascular stress test 05/28/12   no ischemia, EF 37%, imaging results are unchanged and within normal variance  . History of chronic prostatitis   . History of kidney stones   . History of vertigo    +  Hx of posterior HA's.  Neuro (Dr. Erling [name]) eval 2011.  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  . Hypertension   . Lumbar spondylosis    lumbosacral radiculopathy at L4 by EMG testing, right foot drop (neurologist is Dr. Linus [name] with [name] in [place])--neurologist referred him to neurosurgery  . Migraine    "used to have them all the time; none for years" (01/04/2017)  . Myocardial infarction Mason [name])  ?1970s  . Nephrolithiasis 07/2012   Left UVJ 2 mm stone with dilation of renal collecting system and slight hydroureter on right  . Neuropathy   . 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  Since no CAD on cath 05/2016, cards recommends upgrade to CRT-D.  [name] Permanent atrial fibrillation ([place])    DCCV 07/09/13-converted, lasted two days, then back into afib--needs lifetime anticoagulation (Xarelto as of 09/2014)  . Prostate cancer screening 09/2017   done by urol annually (normal prostate exam documented + PSA 0.84 as of 10/01/17 urol f/u.  [name] Rectus diastasis   . Right ankle sprain 08/2017   w/distal fibula avulsion fx noted on u/s but not plain film-(Mason [name]).  . Skin cancer of arm, left    "burned it off" (01/04/2017)  . Type II diabetes mellitus ([place])    Past Surgical History:  Procedure Laterality Date  . BACK SURGERY    . BI-VENTRICULAR IMPLANTABLE CARDIOVERTER DEFIBRILLATOR  (CRT-D) Left 01/04/2017  . BIV ICD INSERTION CRT-D N/A 01/04/2017   Procedure: BiV ICD ;  Surgeon: Mason Haw, Mason [name];  Location: [facility];  Service: Cardiovascular;  Laterality: N/A;  . CARDIAC CATHETERIZATION N/A 06/14/2016   Minimal nonobstructive dz, EF 25-35%.  Procedure: Left Heart Cath and Coronary Angiography;  Surgeon: Mason M Martinique, Mason [name];  Location: [facility];  Service: Cardiovascular;  Laterality: N/A;  . CARDIOVASCULAR STRESS TEST  2012   2012 nuclear perfusion study: low risk scan; 04/2016 normal myocardial perfusion imaging, EF 32%.  [name] CARDIOVERSION  07/09/2012   Procedure: CARDIOVERSION;  Surgeon: Mason Klein, Mason [name];  Location: [facility];  Service: Cardiovascular;  Laterality: N/A;  . CATARACT EXTRACTION W/ INTRAOCULAR LENS IMPLANT & ANTERIOR VITRECTOMY, BILATERAL Bilateral   . COLONOSCOPY W/ POLYPECTOMY  approx 2006; repeated 09/2011   Polyps on 2013 EGD as well, repeat 12/2014  . ESOPHAGOGASTRODUODENOSCOPY   10/18/06   Done due to chronic GERD: Normal, bx showed no barrett's esophagus (Dr. Benson [name])  . FLEXOR TENDON REPAIR Left 10/02/2016   Procedure: LEFT RING FINGER WOUND EXPORATION AND FLEXOR TENDON REPAIR AND NERVE REPAIR;  Surgeon: Milly Jakob, Mason [name];  Location: [facility];  Service: Orthopedics;  Laterality: Left;  . INSERT / REPLACE / REMOVE PACEMAKER  02/05/2012   dual chamber, sinus node dysfunction, sinus arrest, PAF, Medtronic Revo serial#-[id number] H: last checked 05/2015  . LUMBAR LAMINECTOMY Left 1976   L4-5  . PACEMAKER REMOVAL  01/04/2017  . PERMANENT PACEMAKER INSERTION N/A 02/05/2012   Procedure: PERMANENT PACEMAKER INSERTION;  Surgeon: Mason Klein, Mason [name]; Generator Medtronic Revo model [place] serial number [id number] H Laterality: N/A;  . RETINAL DETACHMENT SURGERY Left ~ 1999  . REVERSE SHOULDER ARTHROPLASTY Left 2018   Left shoulder reverse [name] in [place]).  . TRANSTHORACIC ECHOCARDIOGRAM  08/25/10; 05/2012; 03/23/16   mild asymmetric LVH, normal systolic function, normal diastolic fxn, mild-to-mod mitral regurg, mild aortic valve sclerosis and trace AI, mild aortic root dilatation. 2014 f/u showed EF  40-45%, mod LAE, A FIB.  02/2016 EF 40%, diffuse hypokinesis, grade 2 DD.     Current Facility-Administered Medications  Medication Dose Route Frequency Provider Last Rate Last Dose  . 0.9 %  sodium chloride infusion  250 mL Intravenous PRN Jermany Sundell, Shaune Pascal, Mason [name]      . acetaminophen (TYLENOL) tablet 650 mg  650 mg Oral Q4H PRN Zygmund Passero, Shaune Pascal, Mason [name]      . insulin aspart (novoLOG) injection 0-15 Units  0-15 Units Subcutaneous TID WC Saphire Barnhart, Shaune Pascal, Mason [name]      . insulin aspart (novoLOG) injection 0-5 Units  0-5 Units Subcutaneous QHS Abby Stines, Shaune Pascal, Mason [name]      . ondansetron [facility]) injection 4 mg  4 mg Intravenous Q6H PRN Myia Bergh, Shaune Pascal, Mason [name]      . sodium chloride flush (NS) 0.9 % injection 3 mL  3 mL Intravenous Q12H Canaan Holzer, Shaune Pascal, Mason [name]      . sodium chloride  flush (NS) 0.9 % injection 3 mL  3 mL Intravenous PRN Uldine Fuster, Shaune Pascal, Mason [name]        Allergies:   Patient has no known allergies.   Social History:  The patient  reports that he has never smoked. He has never used smokeless tobacco. He reports that he drinks alcohol. He reports that he does not use drugs.   Family History:  The patient's family history includes Heart failure in his mother; Stroke in his father and mother.    ROS:  Please see the history of present illness.   Otherwise, review of systems is positive for none.   All other systems are reviewed and negative.   PHYSICAL EXAM: VS:  BP (!) 132/93   Pulse 69   Temp 98 F (36.7 C) (Oral)   Resp 16   Ht 5\' 11"  (1.803 m)   Wt 103 kg   SpO2 99%   BMI 31.66 kg/m  , BMI Body mass index is 31.66 kg/m. General:  Well appearing. No resp difficulty HEENT: normal Neck: supple. no JVD. Carotids 2+ bilat; no bruits. No lymphadenopathy or thryomegaly appreciated. Cor: PMI nondisplaced. Regular rate & rhythm. No rubs, gallops or murmurs. Lungs: clear Abdomen: soft, nontender, nondistended. No hepatosplenomegaly. No bruits or masses. Good bowel sounds. Extremities: no cyanosis, clubbing, rash, edema Neuro: alert & orientedx3, cranial nerves grossly intact. moves all 4 extremities wit only minimal left hand weakness Affect pleasant  EKG:  EKG pending  Personal review of the ekg ordered 01/18/18 shows AF, BiV paced  Recent Labs: 07/27/2017: ALT 14 03/18/2018: BUN 13; Creatinine, Ser 1.34; Hemoglobin 13.9; Platelets 137; Potassium 4.4; Sodium 139    Lipid Panel     Component Value Date/Time   CHOL 134 07/27/2017 1158   TRIG 144.0 07/27/2017 1158   HDL 37.50 (L) 07/27/2017 1158   CHOLHDL 4 07/27/2017 1158   VLDL 28.8 07/27/2017 1158   LDLCALC 68 07/27/2017 1158     Wt Readings from Last 3 Encounters:  03/22/18 103 kg  03/18/18 103 kg  03/04/18 105.2 kg      Other studies Reviewed: Additional studies/ records that were  reviewed today include: Cardiac cath 06/14/16, TTE 03/23/16  Review of the above records today demonstrates:   There is severe left ventricular systolic dysfunction.  The left ventricular ejection fraction is 25-35% by visual estimate.  LV end diastolic pressure is normal.   1. Minimal nonobstructive CAD 2. Severe LV dysfunction 3. Normal LV EDP  - Left ventricle: The cavity size  was normal. Wall thickness was   increased in a pattern of mild LVH. The estimated ejection   fraction was 40%. Diffuse hypokinesis. Features are consistent   with a pseudonormal left ventricular filling pattern, with   concomitant abnormal relaxation and increased filling pressure   (grade 2 diastolic dysfunction). - Aortic valve: There was no stenosis. - Aorta: Ascending aortic diameter: 43 mm (S). - Ascending aorta: The ascending aorta was mildly dilated. - Mitral valve: There was trivial regurgitation. - Left atrium: The atrium was mildly dilated. - Right ventricle: The cavity size was normal. Pacer wire or   catheter noted in right ventricle. Systolic function was mildly   reduced. - Right atrium: The atrium was mildly dilated. - Tricuspid valve: Peak RV-RA gradient (S): 26 mm Hg. - Pulmonary arteries: PA peak pressure: 34 mm Hg (S). - Systemic veins: IVC measured 2.0 cm with < 50% respirophasic   variation, suggesting RA pressure 8 mmHg.   ASSESSMENT AND PLAN:  1. Peri-procedural TIA - likely due to embolus to distal branch of RMCA - has been seen acutely by Stroke team - deficits now resolving - CT negative - 23-obs with frequent Neuro checks - Resume Xarelto tonight - Cannot get MRI with ICD  2. Systolic heart failure:  - EF dropped after RV pacing.  He has had a CRT-D upgrade 01/04/2017.   - EF still 30-35% - Continues with NYHA III symptoms - RHC/LHC today with normal filling pressures and moderately depressed CO. Minimal CAD- Will need CPX as outpatient    3.  Permanent atrial  fibrillation:  -This patients CHA2DS2-VASc Score and unadjusted Ischemic Stroke Rate (% per year) is equal to 4.8 % stroke rate/year from a score of 4 - Resume xarelto tonight  3. DM2 - hold oral agents. Cover with SSI  4. Hypertension: Currently well controlled.  5. CKD 3 - hydrate post-cath. BMET in am   [name], Mason [name]  3:21 PM

## 2018-03-22 NOTE — Progress Notes (Signed)
Pt arrived to short stay about 1405 post right/left heart cath. Pt asked to sit up to get off stretcher, unable to do so. Pt couldn't hold himself up at first. Laid back in stretcher, quick neuro assessment done by Garen Lah, RCIS. We then got the patient into the chair, this RN asked pt to hold arm up to put a pillow under it, pt unable to lift arm. I raised his arm up for him and asked him to hold it there, pt unable to do so. Alicia informed Dr. Haroldine Laws and we then called a code stroke. Pt went to CT. Upon arrival back to short stay, pt has regained use of L arm, equal to R side. Waiting for bed placement, pt on tele, neuro checks q2.

## 2018-03-22 NOTE — Progress Notes (Signed)
Report given to Annitta Jersey, RN on 6E.

## 2018-03-23 ENCOUNTER — Observation Stay (HOSPITAL_BASED_OUTPATIENT_CLINIC_OR_DEPARTMENT_OTHER): Payer: Medicare Other

## 2018-03-23 ENCOUNTER — Encounter (HOSPITAL_COMMUNITY): Payer: Self-pay | Admitting: Nurse Practitioner

## 2018-03-23 DIAGNOSIS — G9782 Other postprocedural complications and disorders of nervous system: Secondary | ICD-10-CM | POA: Diagnosis not present

## 2018-03-23 DIAGNOSIS — I5022 Chronic systolic (congestive) heart failure: Secondary | ICD-10-CM | POA: Diagnosis not present

## 2018-03-23 DIAGNOSIS — N183 Chronic kidney disease, stage 3 unspecified: Secondary | ICD-10-CM

## 2018-03-23 DIAGNOSIS — E1122 Type 2 diabetes mellitus with diabetic chronic kidney disease: Secondary | ICD-10-CM | POA: Diagnosis not present

## 2018-03-23 DIAGNOSIS — I428 Other cardiomyopathies: Secondary | ICD-10-CM

## 2018-03-23 DIAGNOSIS — I130 Hypertensive heart and chronic kidney disease with heart failure and stage 1 through stage 4 chronic kidney disease, or unspecified chronic kidney disease: Secondary | ICD-10-CM | POA: Diagnosis not present

## 2018-03-23 DIAGNOSIS — G459 Transient cerebral ischemic attack, unspecified: Secondary | ICD-10-CM

## 2018-03-23 DIAGNOSIS — I429 Cardiomyopathy, unspecified: Secondary | ICD-10-CM | POA: Diagnosis not present

## 2018-03-23 DIAGNOSIS — I482 Chronic atrial fibrillation: Secondary | ICD-10-CM | POA: Diagnosis not present

## 2018-03-23 DIAGNOSIS — I2510 Atherosclerotic heart disease of native coronary artery without angina pectoris: Secondary | ICD-10-CM | POA: Diagnosis not present

## 2018-03-23 LAB — BASIC METABOLIC PANEL
Anion gap: 6 (ref 5–15)
BUN: 19 mg/dL (ref 8–23)
CO2: 30 mmol/L (ref 22–32)
Calcium: 8.5 mg/dL — ABNORMAL LOW (ref 8.9–10.3)
Chloride: 102 mmol/L (ref 98–111)
Creatinine, Ser: 1.46 mg/dL — ABNORMAL HIGH (ref 0.61–1.24)
GFR calc Af Amer: 52 mL/min — ABNORMAL LOW (ref >60)
GFR calc non Af Amer: 45 mL/min — ABNORMAL LOW (ref >60)
Glucose, Bld: 164 mg/dL — ABNORMAL HIGH (ref 70–99)
Potassium: 4.3 mmol/L (ref 3.5–5.1)
Sodium: 138 mmol/L (ref 135–145)

## 2018-03-23 LAB — CBC
HCT: 40.7 % (ref 39.0–52.0)
Hemoglobin: 13.2 g/dL (ref 13.0–17.0)
MCH: 29.5 pg (ref 26.0–34.0)
MCHC: 32.4 g/dL (ref 30.0–36.0)
MCV: 91.1 fL (ref 78.0–100.0)
Platelets: 137 10*3/uL — ABNORMAL LOW (ref 150–400)
RBC: 4.47 MIL/uL (ref 4.22–5.81)
RDW: 12.9 % (ref 11.5–15.5)
WBC: 4.6 10*3/uL (ref 4.0–10.5)

## 2018-03-23 LAB — LIPID PANEL
Cholesterol: 137 mg/dL (ref 0–200)
HDL: 33 mg/dL — ABNORMAL LOW (ref >40)
LDL Cholesterol: 73 mg/dL (ref 0–99)
Total CHOL/HDL Ratio: 4.2 RATIO
Triglycerides: 154 mg/dL — ABNORMAL HIGH (ref <150)
VLDL: 31 mg/dL (ref 0–40)

## 2018-03-23 LAB — GLUCOSE, CAPILLARY: Glucose-Capillary: 157 mg/dL — ABNORMAL HIGH (ref 70–99)

## 2018-03-23 MED ORDER — METOPROLOL SUCCINATE ER 100 MG PO TB24: 100.0000 mg | ORAL_TABLET | Freq: Every day | ORAL | 6 refills | Status: DC

## 2018-03-23 MED ORDER — FUROSEMIDE 20 MG PO TABS
20.0000 mg | ORAL_TABLET | Freq: Once | ORAL | Status: AC
  Administered 2018-03-23: 20 mg via ORAL
  Filled 2018-03-23: qty 1

## 2018-03-23 NOTE — Discharge Instructions (Signed)
**  PLEASE REMEMBER TO BRING ALL OF YOUR MEDICATIONS TO EACH OF YOUR FOLLOW-UP OFFICE VISIT  Radial Site Care Refer to this sheet in the next few weeks. These instructions provide you with information on caring for yourself after your procedure. Your caregiver may also give you more specific instructions. Your treatment has been planned according to current medical practices, but problems sometimes occur. Call your caregiver if you have any problems or questions after your procedure. HOME CARE INSTRUCTIONS  You may shower the day after the procedure.Remove the bandage (dressing) and gently wash the site with plain soap and water.Gently pat the site dry.   Do not apply powder or lotion to the site.   Do not submerge the affected site in water for 3 to 5 days.   Inspect the site at least twice daily.   Do not flex or bend the affected arm for 24 hours.   No lifting over 5 pounds (2.3 kg) for 5 days after your procedure.   Do not drive home if you are discharged the same day of the procedure. Have someone else drive you.   What to expect:  Any bruising will usually fade within 1 to 2 weeks.   Blood that collects in the tissue (hematoma) may be painful to the touch. It should usually decrease in size and tenderness within 1 to 2 weeks.  SEEK IMMEDIATE MEDICAL CARE IF:  You have unusual pain at the radial site.   You have redness, warmth, swelling, or pain at the radial site.   You have drainage (other than a small amount of blood on the dressing).   You have chills.   You have a fever or persistent symptoms for more than 72 hours.   You have a fever and your symptoms suddenly get worse.   Your arm becomes pale, cool, tingly, or numb.   You have heavy bleeding from the site. Hold pressure on the site.  _____________     10 Habits of Highly Healthy People  [place] [facility] wants to help you get well and stay well.  Live a longer, healthier life by practicing healthy habits every  day.  1.  Visit your primary care provider regularly. 2.  Make time for family and friends.  Healthy relationships are important. 3.  Take medications as directed by your provider. 4.  Maintain a healthy weight and a trim waistline. 5.  Eat healthy meals and snacks, rich in fruits, vegetables, whole grains, and lean proteins. 6.  Get moving every day - aim for 150 minutes of moderate physical activity each week. 7.  Don't smoke. 8.  Avoid alcohol or drink in moderation. 9.  Manage stress through meditation or mindful relaxation. 10.  Get seven to nine hours of quality sleep each night.  Want more information on healthy habits?  To learn more about these and other healthy habits, visit [email or web address]. _____________

## 2018-03-23 NOTE — Progress Notes (Signed)
STROKE TEAM PROGRESS NOTE   HISTORY OF PRESENT ILLNESS (per record) Mason [name] is an 77 y.o. male  With PMH significant for DM 2, a fib ( on Xarelto), HTN,  HLD, Heart block ( pacemaker implanted) who presented to [facility] today for a cardiac cath about 30 minutes after the catheterization was completed a code stroke was called for flaccid left arm.  Per patient and short stay nurse patient left arm was flaccid. He was unable to move his left arm and he was unable to walk. Code stroke was activated and symptoms had resolved prior to CT.  Patient stated that his last dose of xarelto was Tuesday 03/19/18. Denies any CP, SOB, vision changes.  ED course:  Ct Head: no acute intracranial abnormality  No prior stroke history  Date last known well: Date: 03/22/2018 Time last known well: Time: 14:00 tPA Given: No: too mild to treat   Modified Rankin: Rankin Score=0  NIHSS: 0   SUBJECTIVE (INTERVAL HISTORY) His wife is at the bedside.  Asian has been up and around and wants to go home. He states he thinks he has made a full neurological recovery a CT scan of the head did not show any acute infarct. Carotid Dopplers were unremarkable. Lipid profile shows LDL borderline at 73 mg percent    OBJECTIVE Vitals:   03/23/18 0011 03/23/18 0519 03/23/18 0522 03/23/18 0812  BP: 122/88 137/90  (!) 142/90  Pulse: 70 70  70  Resp: 15 15    Temp: 97.6 F (36.4 C) 97.7 F (36.5 C)  97.8 F (36.6 C)  TempSrc: Oral Oral    SpO2: 98% 100%  98%  Weight:   105.3 kg   Height:        CBC:  Recent Labs  Lab 03/18/18 0918 03/23/18 0500  WBC 4.3 4.6  HGB 13.9 13.2  HCT 42.7 40.7  MCV 91.4 91.1  PLT 137* 137*    Basic Metabolic Panel:  Recent Labs  Lab 03/18/18 0918 03/23/18 0500  NA 139 138  K 4.4 4.3  CL 102 102  CO2 29 30  GLUCOSE 219* 164*  BUN 13 19  CREATININE 1.34* 1.46*  CALCIUM 8.9 8.5*    Lipid Panel:     Component Value Date/Time   CHOL 134 07/27/2017 1158   TRIG  144.0 07/27/2017 1158   HDL 37.50 (L) 07/27/2017 1158   CHOLHDL 4 07/27/2017 1158   VLDL 28.8 07/27/2017 1158   LDLCALC 68 07/27/2017 1158   HgbA1c:  Lab Results  Component Value Date   HGBA1C 8.1 (H) 02/06/2018   Urine Drug Screen: No results found for: LABOPIA, COCAINSCRNUR, LABBENZ, AMPHETMU, THCU, LABBARB  Alcohol Level No results found for: [place] Head Code Stroke Wo Contrast 03/22/2018 IMPRESSION:  1. No acute finding.ASPECTS is 10.  2. Chronic small vessel ischemia.     Transthoracic Echocardiogram 01/16/2018 Study Conclusions - Left ventricle: The cavity size was normal. Wall thickness was   increased in a pattern of mild LVH. Systolic function was   moderately reduced. The estimated ejection fraction was in the   range of 35% to 40%. Diffuse hypokinesis. Doppler parameters are   consistent with a reversible restrictive pattern, indicative of   decreased left ventricular diastolic compliance and/or increased   left atrial pressure (grade 3 diastolic dysfunction). - Aorta: Aortic root dimension: 39 mm (ED). - Ascending aorta: The ascending aorta was mildly dilated. - Mitral valve: There was mild regurgitation. -  Left atrium: The atrium was moderately dilated. Volume/bsa, ES   (1-plane Simpson&'s, A4C): 40 ml/m^2. - Right ventricle: Pacer wire or catheter noted in right ventricle. - Right atrium: The atrium was mildly dilated. Pacer wire or   catheter noted in right atrium. - Tricuspid valve: There was mild regurgitation. Impressions:  - Compared to the prior study, there has been no significant   interval change.   PHYSICAL EXAM Blood pressure (!) 142/90, pulse 70, temperature 97.8 F (36.6 C), resp. rate 15, height 5\' 11"  (1.803 m), weight 105.3 kg, SpO2 98 %. Pleasant elderly Caucasian male currently not in distress. . Afebrile. Head is nontraumatic. Neck is supple without bruit.    Cardiac exam no murmur or gallop. Lungs are clear to  auscultation. Distal pulses are well felt. Neurological Exam ;  Awake  Alert oriented x 3. Normal speech and language.eye movements full without nystagmus.fundi were not visualized. Vision acuity and fields appear normal. Hearing is normal. Palatal movements are normal. Face symmetric. Tongue midline. Normal strength, tone, reflexes and coordination.diminished fine finger movements on the left. Orbits right over left upper extremity. Minimum left grip weakness. Normal sensation. Gait deferred.        ASSESSMENT/PLAN Mr. HARRELL NIEHOFF is a 77 y.o. male with history of type 2 diabetes, previous TIA, prostate cancer, atrial fibrillation, AICD, nonischemic cardiomyopathy, coronary artery disease with previous MI, migraine headaches, hypertension, hyperlipidemia, depression, renal insufficiency, and congestive heart failure, presenting with a flaccid left arm and gait difficulties following a cardiac catheterization. He did not receive IV t-PA due to resolution of deficits.  Probable small right brain subcortical infarct versusTIA:    Resultant - resolution of deficits.  CT head - no acute finding  MRI head - not performed  MRA head - not performed  Carotid Doppler - unremarkable  2D Echo - 01/16/2018 - EF 35 - 40%  LDL - 68  HgbA1c - 8.1  VTE prophylaxis - Xarelto  Diet - - Heart healthy / carb modified with thin liquids.  Xarelto (rivaroxaban) daily prior to admission, now on Xarelto (rivaroxaban) daily  Patient counseled to be compliant with his antithrombotic medications  Ongoing aggressive stroke risk factor management  Therapy recommendations: resolution of deficits.  Disposition:  Discharged to home.  Hypertension  Stable . Permissive hypertension (OK if < 220/120) but gradually normalize in 5-7 days . Long-term BP goal normotensive  Hyperlipidemia  Lipid lowering medication PTA:  Zocor 20 mg daily  LDL 68, goal < 70  Current lipid lowering medication: Zocor  20 mg daily  Continue statin at discharge  Diabetes  HgbA1c 8.1, goal < 7.0  Uncontrolled  Other Stroke Risk Factors  Advanced age  ETOH use, advised to drink no more than 1 alcoholic beverage per day.  Obesity, Body mass index is 32.39 kg/m., recommend weight loss, diet and exercise as appropriate   Hx of TIA  Family hx stroke (mother and father)  Coronary artery disease  Atrial Fibrillation  Other Active Problems  Creatinine 1.46      Hospital day # 0  I have personally examined this patient, reviewed notes, independently viewed imaging studies, participated in medical decision making and plan of care.ROS completed by me personally and pertinent positives fully documented  I have made any additions or clarifications directly to the above note.  He presented with transient left hemiparesis following cardiac cath procedure and seems to have improved near completely. Continue Xarelto for stroke prevention and aggressive risk factor modification. Discussed  with patient, wife and Dr. Haroldine Laws. Greater than 50% time during this) and physical and counseling and coordination of care about strokes and TIAs discussion about prevention Antony Contras, MD Medical Director [place] Pager: [phone] 03/23/2018 3:35 PM   To contact Stroke Continuity provider, please refer to [email or web address] After hours, contact General Neurology

## 2018-03-23 NOTE — Discharge Summary (Signed)
Discharge Summary    Patient ID: Mason [name],  MRN: [id number], DOB/AGE: [birth year] 31 y.o.  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 Vessel is angiographically normal.  Left Anterior Descending  Vessel is normal in caliber. The vessel exhibits minimal luminal irregularities.  Left Circumflex  Vessel is small. Vessel is angiographically normal.  Right Coronary Artery  Vessel is large. Vessel is angiographically normal.  Dist RCA lesion 20% stenosed  Dist RCA lesion is 20% stenosed.   Findings:   Ao = 138/85 (107) LV = 133/16 RA = 7 RV = 33/8 PA = 30/10 (22) PCW = 13 Fick cardiac output/index = 4.4/2.0 PVR = 2.0 WU Ao sat = 98% PA sat = 62%, 62%   Assessment/Plan: 1. Minimal CAD 2. 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 Well-compensated filling pressures with moderately reduced CO 4. Complicated radial access bilaterally as described above. Suggest femoral approach for any further catheterizations   [name], MD   _____________   History of Present Illness     77 year old male with a history of permanent atrial fibrillation, complete heart block, nonischemic cardiomyopathy status post ICD placement, chronic combined systolic and diastolic congestive heart failure, type 2 diabetes mellitus, hypertension, hyperlipidemia, and  stage III chronic kidney disease.  In the setting of a several month history of progressive heart failure symptoms with exertional fatigue and dyspnea, he was referred for outpatient diagnostic catheterization.  He is on chronic Xarelto therapy as an outpatient and this was held beginning August 28.  Hospital Course     Consultants: Neurology  Patient underwent diagnostic cardiac catheterization which revealed minimal nonobstructive right coronary artery disease with relatively normal right heart pressures.  Fick cardiac output was 4.4 L/min with a cardiac index of 2.0 L/min/m.  Patient was recovered in the short stay area with an initial plan for discharge however, he developed acute onset of left upper extremity weakness and left facial droop.  He was initially unable to lift his left arm at all.  A code stroke was activated.  Patient underwent emergent head CT which showed no acute findings.  Chronic small vessel ischemia was noted.  Symptoms completely resolved prior to CT.  He was seen by the neuro hospitalist stroke team felt that he most likely suffered an embolus to a distal branch of the right MCA.  MRI was not able to be performed as the patient has an ICD.    Mason [name] was observed overnight and had no recurrence of neurological deficits.  He has been ambulating without difficulty.  On examination this morning, he has mildly elevated JVP.  In the setting of significant hydration post pre-and post catheterization, he has been given 1 dose of oral Lasix prior to discharge. We have also reduced his Toprol XL to 100 mg daily.  Renal function remains stable.  He will be discharged home today in good  condition with arrangements for early outpatient follow-up in heart failure clinic and subsequent plans for outpatient CPX testing as he may benefit from optimization of pacing parameters.   _____________  Discharge Vitals Blood pressure (!) 142/90, pulse 70, temperature 97.8 F (36.6 C), resp. rate  15, height 5\' 11"  (1.803 m), weight 105.3 kg, SpO2 98 %.  Filed Weights   03/22/18 0945 03/23/18 0522  Weight: 103 kg 105.3 kg    Labs & Radiologic Studies    CBC Recent Labs    03/23/18 0500  WBC 4.6  HGB 13.2  HCT 40.7  MCV 91.1  PLT 409*   Basic Metabolic Panel Recent Labs    03/23/18 0500  NA 138  K 4.3  CL 102  CO2 30  GLUCOSE 164*  BUN 19  CREATININE 1.46*  CALCIUM 8.5*  _____________  Ct Head Code Stroke Wo Contrast`  Result Date: 03/22/2018 CLINICAL DATA:  Code stroke. Left-sided weakness after cardiac catheterization EXAM: CT HEAD WITHOUT CONTRAST TECHNIQUE: Contiguous axial images were obtained from the base of the skull through the vertex without intravenous contrast. COMPARISON:  01/16/2010 FINDINGS: Brain: No evidence of acute infarction, hemorrhage, hydrocephalus, extra-axial collection or mass lesion/mass effect. Moderate low-density in the cerebral white matter attributed to chronic small vessel ischemia. Cerebral volume loss that is generalized. Vascular: Atherosclerotic calcification. There is also occasional venous gas within the plexus at the foramen magnum and cavernous sinus region. Detection of hyperdense vessel is limited in the setting of recent IV contrast. Skull: No acute finding Sinuses/Orbits: Bilateral cataract resection and left scleral band. Other: These results were communicated to Dr. Lorraine Lax at 2:49 pmon 8/30/2019by text page via the [facility] messaging system. ASPECTS [facility] Stroke Program Early CT Score) - Ganglionic level infarction (caudate, lentiform nuclei, internal capsule, insula, M1-M3 cortex): 7 - Supraganglionic infarction (M4-M6 cortex): 3 Total score (0-10 with 10 being normal): 10 IMPRESSION: 1. No acute finding.ASPECTS is 10. 2. Chronic small vessel ischemia. Electronically Signed   By: Monte Fantasia M.D.   On: 03/22/2018 14:50   Disposition   Pt is being discharged home today in good condition.  Follow-up Plans & Appointments     Follow-up Information    Bensimhon, Shaune Pascal, MD Follow up.   Specialty:  Cardiology Why:  We will arrange for follow-up within the next 7-10 days and contact you. Contact information: [place] [address] [phone]        [name], MD Follow up.   Specialty:  Cardiology Why:  We will arrange for follow-up within the next month and contact you. Contact information: [address] [place] [address] [phone]            Discharge Medications   Allergies as of 03/23/2018   No Known Allergies     Medication List    TAKE these medications   acetaminophen 500 MG tablet Commonly known as:  TYLENOL Take 1,000 mg by mouth every 6 (six) hours as needed for moderate pain or headache.   colchicine 0.6 MG tablet Take 0.6 mg by mouth daily as needed (for gout).   COMBIGAN 0.2-0.5 % ophthalmic solution Generic drug:  brimonidine-timolol Place 1 drop into both eyes at bedtime.   ENTRESTO 97-103 MG Generic drug:  sacubitril-valsartan Take 1 tablet by mouth 2 (two) times daily.   freestyle lancets Use as instructed   gabapentin 300 MG capsule Commonly known as:  NEURONTIN Take 1 capsule (300 mg total) by mouth  3 (three) times daily. What changed:    how much to take  when to take this  additional instructions   glipiZIDE 5 MG 24 hr tablet Commonly known as:  GLUCOTROL XL Take 10 mg by mouth daily with breakfast.   glucose blood test strip Use to check blood sugars 1-2 times daily   latanoprost 0.005 % ophthalmic solution Commonly known as:  XALATAN Place 1 drop into both eyes at bedtime.   meclizine 25 MG tablet Commonly known as:  ANTIVERT Take 1 tablet (25 mg total) 3 (three) times daily as needed by mouth for dizziness.   metoprolol succinate 100 MG 24 hr tablet Commonly known as:  TOPROL-XL Take 1 tablet (100 mg total) by mouth daily. Take with or immediately following a meal. What  changed:  how much to take   omeprazole 40 MG capsule Commonly known as:  PRILOSEC Take 40 mg by mouth at bedtime.   rivaroxaban 20 MG Tabs tablet Commonly known as:  XARELTO Take 20 mg by mouth at bedtime.   rOPINIRole 1 MG tablet Commonly known as:  REQUIP Take 1 tablet (1 mg total) by mouth daily. What changed:  when to take this   sertraline 100 MG tablet Commonly known as:  ZOLOFT Take 100 mg by mouth daily.   simvastatin 20 MG tablet Commonly known as:  ZOCOR Take 20 mg by mouth at bedtime.   sitaGLIPtin 100 MG tablet Commonly known as:  JANUVIA Take 100 mg by mouth at bedtime.        Acute coronary syndrome (MI, NSTEMI, STEMI, etc) this admission?: No.    Outstanding Labs/Studies   None  Duration of Discharge Encounter   Greater than 30 minutes including physician time.  Signed, Murray Hodgkins, NP 03/23/2018, 8:59 AM

## 2018-03-23 NOTE — Progress Notes (Signed)
VASCULAR LAB PRELIMINARY  PRELIMINARY  PRELIMINARY  PRELIMINARY  Carotid duplex completed.    Preliminary report:  1-39% ICA plaquing. Vertebral artery flow is antegrade.  Amyriah Buras, RVT 03/23/2018, 10:33 AM

## 2018-03-23 NOTE — Progress Notes (Signed)
Advanced Heart Failure Rounding Note   Subjective:    Feels good this am. Neuro deficits totally resolved. Denies SOB.  Creatinine stable at 1.4   Objective:   Weight Range:  Vital Signs:   Temp:  [97.4 F (36.3 C)-98.5 F (36.9 C)] 97.8 F (36.6 C) (08/31 0812) Pulse Rate:  [0-89] 70 (08/31 0812) Resp:  [10-30] 15 (08/31 0519) BP: (115-172)/(73-101) 142/90 (08/31 0812) SpO2:  [96 %-100 %] 98 % (08/31 0812) Weight:  [103 kg-105.3 kg] 105.3 kg (08/31 0522)    Weight change: Filed Weights   03/22/18 0945 03/23/18 0522  Weight: 103 kg 105.3 kg    Intake/Output:   Intake/Output Summary (Last 24 hours) at 03/23/2018 0814 Last data filed at 03/23/2018 0400 Gross per 24 hour  Intake 454.73 ml  Output 200 ml  Net 254.73 ml     Physical Exam: General:  Well appearing. No resp difficulty HEENT: normal Neck: supple. JVP 9 . Carotids 2+ bilat; no bruits. No lymphadenopathy or thryomegaly appreciated. Cor: PMI nondisplaced. Regular rate & rhythm. No rubs, gallops or murmurs. Lungs: clear Abdomen: soft, nontender, nondistended. No hepatosplenomegaly. No bruits or masses. Good bowel sounds. Extremities: no cyanosis, clubbing, rash, edema Neuro: alert & orientedx3, cranial nerves grossly intact. moves all 4 extremities w/o difficulty. Affect pleasant  Telemetry: AV with vpacing 70 Personally reviewed   Labs: Basic Metabolic Panel: Recent Labs  Lab 03/18/18 0918 03/23/18 0500  NA 139 138  K 4.4 4.3  CL 102 102  CO2 29 30  GLUCOSE 219* 164*  BUN 13 19  CREATININE 1.34* 1.46*  CALCIUM 8.9 8.5*    Liver Function Tests: No results for input(s): AST, ALT, ALKPHOS, BILITOT, PROT, ALBUMIN in the last 168 hours. No results for input(s): LIPASE, AMYLASE in the last 168 hours. No results for input(s): AMMONIA in the last 168 hours.  CBC: Recent Labs  Lab 03/18/18 0918 03/23/18 0500  WBC 4.3 4.6  HGB 13.9 13.2  HCT 42.7 40.7  MCV 91.4 91.1  PLT 137* 137*     Cardiac Enzymes: No results for input(s): CKTOTAL, CKMB, CKMBINDEX, TROPONINI in the last 168 hours.  BNP: BNP (last 3 results) No results for input(s): BNP in the last 8760 hours.  ProBNP (last 3 results) No results for input(s): PROBNP in the last 8760 hours.    Other results:  Imaging: Ct Head Code Stroke Wo Contrast`  Result Date: 03/22/2018 CLINICAL DATA:  Code stroke. Left-sided weakness after cardiac catheterization EXAM: CT HEAD WITHOUT CONTRAST TECHNIQUE: Contiguous axial images were obtained from the base of the skull through the vertex without intravenous contrast. COMPARISON:  01/16/2010 FINDINGS: Brain: No evidence of acute infarction, hemorrhage, hydrocephalus, extra-axial collection or mass lesion/mass effect. Moderate low-density in the cerebral white matter attributed to chronic small vessel ischemia. Cerebral volume loss that is generalized. Vascular: Atherosclerotic calcification. There is also occasional venous gas within the plexus at the foramen magnum and cavernous sinus region. Detection of hyperdense vessel is limited in the setting of recent IV contrast. Skull: No acute finding Sinuses/Orbits: Bilateral cataract resection and left scleral band. Other: These results were communicated to Dr. Lorraine Lax at 2:49 pmon 8/30/2019by text page via the [facility] messaging system. ASPECTS [facility] Stroke Program Early CT Score) - Ganglionic level infarction (caudate, lentiform nuclei, internal capsule, insula, M1-M3 cortex): 7 - Supraganglionic infarction (M4-M6 cortex): 3 Total score (0-10 with 10 being normal): 10 IMPRESSION: 1. No acute finding.ASPECTS is 10. 2. Chronic small vessel ischemia. Electronically Signed  By: Monte Fantasia M.D.   On: 03/22/2018 14:50      Medications:     Scheduled Medications: . brimonidine  1 drop Both Eyes QHS   And  . timolol  1 drop Both Eyes QHS  . gabapentin  300 mg Oral QHS   And  . gabapentin  600 mg Oral Daily  . insulin aspart   0-15 Units Subcutaneous TID WC  . insulin aspart  0-5 Units Subcutaneous QHS  . latanoprost  1 drop Both Eyes QHS  . metoprolol succinate  150 mg Oral Daily  . pantoprazole  40 mg Oral Daily  . rivaroxaban  20 mg Oral QHS  . rOPINIRole  1 mg Oral QHS  . sertraline  100 mg Oral Daily  . simvastatin  20 mg Oral QHS  . sodium chloride flush  3 mL Intravenous Q12H  . sodium chloride flush  3 mL Intravenous Q12H     Infusions: . sodium chloride    . sodium chloride    . sodium chloride 50 mL/hr at 03/23/18 0400     PRN Medications:  sodium chloride, sodium chloride, acetaminophen, ondansetron (ZOFRAN) IV, sodium chloride flush, sodium chloride flush   Assessment/Plan:    1. Peri-procedural TIA - likely due to embolus to distal branch of RMCA - has been seen acutely by Stroke team - deficits now resolving - CT negative - Cannot get MRI with ICD - All deficits resolved. Can go home today. Continue Xarelto   2. Systolic heart failure:  - EF dropped after RV pacing.  He has had a CRT-D upgrade 01/04/2017.   - EF still 30-35% - Continues with NYHA III symptoms - RHC/LHC 8/30 with normal filling pressures and moderately depressed CO. Minimal CAD- Will need CPX as outpatient. May benefit from optimization of pacing parameters. - JVP up slightly after cath. Will give one dose po lasix prior to d/c  3.  Permanent atrial fibrillation:  -This patients CHA2DS2-VASc Score and unadjusted Ischemic Stroke Rate (% per year) is equal to 4.8 % stroke rate/year from a score of 4 - Continue Xarelto  4. DM2 - Stable consider Jardiance as outpatient  5.  Hypertension: Currently well controlled.  6. CKD 3 - stable post--cath  Dispo: home today. Arrange f/u with Dr. Recardo Evangelist as well as me in HF Clinic    Length of Stay: 0   [name] MD 03/23/2018, 8:14 AM  Advanced Heart Failure Team Pager [phone] (M-F; 7a - 4p)  Please contact [facility] for night-coverage  after hours (4p -7a ) and weekends on [email or web address]

## 2018-03-25 ENCOUNTER — Encounter (HOSPITAL_COMMUNITY): Payer: Self-pay | Admitting: Internal Medicine

## 2018-04-03 DIAGNOSIS — H01022 Squamous blepharitis right lower eyelid: Secondary | ICD-10-CM | POA: Diagnosis not present

## 2018-04-03 DIAGNOSIS — Z961 Presence of intraocular lens: Secondary | ICD-10-CM | POA: Diagnosis not present

## 2018-04-03 DIAGNOSIS — H01024 Squamous blepharitis left upper eyelid: Secondary | ICD-10-CM | POA: Diagnosis not present

## 2018-04-03 DIAGNOSIS — H01025 Squamous blepharitis left lower eyelid: Secondary | ICD-10-CM | POA: Diagnosis not present

## 2018-04-03 DIAGNOSIS — H401112 Primary open-angle glaucoma, right eye, moderate stage: Secondary | ICD-10-CM | POA: Diagnosis not present

## 2018-04-03 DIAGNOSIS — H35372 Puckering of macula, left eye: Secondary | ICD-10-CM | POA: Diagnosis not present

## 2018-04-03 DIAGNOSIS — H01021 Squamous blepharitis right upper eyelid: Secondary | ICD-10-CM | POA: Diagnosis not present

## 2018-04-03 DIAGNOSIS — H401123 Primary open-angle glaucoma, left eye, severe stage: Secondary | ICD-10-CM | POA: Diagnosis not present

## 2018-04-04 ENCOUNTER — Ambulatory Visit (HOSPITAL_COMMUNITY)
Admission: RE | Admit: 2018-04-04 | Discharge: 2018-04-04 | Disposition: A | Discharge status: Home / Self Care | Payer: Medicare Other | Source: Ambulatory Visit | Attending: Cardiology | Admitting: Cardiology

## 2018-04-04 ENCOUNTER — Encounter (HOSPITAL_COMMUNITY): Payer: Self-pay

## 2018-04-04 VITALS — BP 128/98 | HR 90 | Wt 233.4 lb

## 2018-04-04 DIAGNOSIS — I428 Other cardiomyopathies: Secondary | ICD-10-CM | POA: Diagnosis not present

## 2018-04-04 DIAGNOSIS — I252 Old myocardial infarction: Secondary | ICD-10-CM | POA: Insufficient documentation

## 2018-04-04 DIAGNOSIS — Z95810 Presence of automatic (implantable) cardiac defibrillator: Secondary | ICD-10-CM | POA: Diagnosis not present

## 2018-04-04 DIAGNOSIS — Z86010 Personal history of colonic polyps: Secondary | ICD-10-CM | POA: Diagnosis not present

## 2018-04-04 DIAGNOSIS — F329 Major depressive disorder, single episode, unspecified: Secondary | ICD-10-CM | POA: Diagnosis not present

## 2018-04-04 DIAGNOSIS — I5042 Chronic combined systolic (congestive) and diastolic (congestive) heart failure: Secondary | ICD-10-CM | POA: Diagnosis not present

## 2018-04-04 DIAGNOSIS — Z7984 Long term (current) use of oral hypoglycemic drugs: Secondary | ICD-10-CM | POA: Insufficient documentation

## 2018-04-04 DIAGNOSIS — M5417 Radiculopathy, lumbosacral region: Secondary | ICD-10-CM | POA: Insufficient documentation

## 2018-04-04 DIAGNOSIS — I442 Atrioventricular block, complete: Secondary | ICD-10-CM | POA: Insufficient documentation

## 2018-04-04 DIAGNOSIS — N529 Male erectile dysfunction, unspecified: Secondary | ICD-10-CM | POA: Diagnosis not present

## 2018-04-04 DIAGNOSIS — E1140 Type 2 diabetes mellitus with diabetic neuropathy, unspecified: Secondary | ICD-10-CM | POA: Diagnosis not present

## 2018-04-04 DIAGNOSIS — R5383 Other fatigue: Secondary | ICD-10-CM | POA: Insufficient documentation

## 2018-04-04 DIAGNOSIS — Z79899 Other long term (current) drug therapy: Secondary | ICD-10-CM | POA: Insufficient documentation

## 2018-04-04 DIAGNOSIS — Z7901 Long term (current) use of anticoagulants: Secondary | ICD-10-CM | POA: Diagnosis not present

## 2018-04-04 DIAGNOSIS — E7800 Pure hypercholesterolemia, unspecified: Secondary | ICD-10-CM | POA: Diagnosis not present

## 2018-04-04 DIAGNOSIS — I130 Hypertensive heart and chronic kidney disease with heart failure and stage 1 through stage 4 chronic kidney disease, or unspecified chronic kidney disease: Secondary | ICD-10-CM | POA: Diagnosis not present

## 2018-04-04 DIAGNOSIS — M1990 Unspecified osteoarthritis, unspecified site: Secondary | ICD-10-CM | POA: Diagnosis not present

## 2018-04-04 DIAGNOSIS — I10 Essential (primary) hypertension: Secondary | ICD-10-CM | POA: Diagnosis not present

## 2018-04-04 DIAGNOSIS — Z87442 Personal history of urinary calculi: Secondary | ICD-10-CM | POA: Diagnosis not present

## 2018-04-04 DIAGNOSIS — I482 Chronic atrial fibrillation: Secondary | ICD-10-CM | POA: Diagnosis not present

## 2018-04-04 DIAGNOSIS — I5022 Chronic systolic (congestive) heart failure: Secondary | ICD-10-CM | POA: Diagnosis not present

## 2018-04-04 DIAGNOSIS — M47816 Spondylosis without myelopathy or radiculopathy, lumbar region: Secondary | ICD-10-CM | POA: Insufficient documentation

## 2018-04-04 DIAGNOSIS — Z8673 Personal history of transient ischemic attack (TIA), and cerebral infarction without residual deficits: Secondary | ICD-10-CM | POA: Diagnosis not present

## 2018-04-04 DIAGNOSIS — Z85828 Personal history of other malignant neoplasm of skin: Secondary | ICD-10-CM | POA: Insufficient documentation

## 2018-04-04 DIAGNOSIS — N183 Chronic kidney disease, stage 3 unspecified: Secondary | ICD-10-CM

## 2018-04-04 DIAGNOSIS — N400 Enlarged prostate without lower urinary tract symptoms: Secondary | ICD-10-CM | POA: Insufficient documentation

## 2018-04-04 DIAGNOSIS — K219 Gastro-esophageal reflux disease without esophagitis: Secondary | ICD-10-CM | POA: Insufficient documentation

## 2018-04-04 DIAGNOSIS — E1122 Type 2 diabetes mellitus with diabetic chronic kidney disease: Secondary | ICD-10-CM | POA: Insufficient documentation

## 2018-04-04 LAB — CBC
HCT: 44.4 % (ref 39.0–52.0)
Hemoglobin: 14.4 g/dL (ref 13.0–17.0)
MCH: 29.9 pg (ref 26.0–34.0)
MCHC: 32.4 g/dL (ref 30.0–36.0)
MCV: 92.1 fL (ref 78.0–100.0)
Platelets: 149 10*3/uL — ABNORMAL LOW (ref 150–400)
RBC: 4.82 MIL/uL (ref 4.22–5.81)
RDW: 13.1 % (ref 11.5–15.5)
WBC: 4.7 10*3/uL (ref 4.0–10.5)

## 2018-04-04 LAB — BASIC METABOLIC PANEL
Anion gap: 7 (ref 5–15)
BUN: 17 mg/dL (ref 8–23)
CO2: 25 mmol/L (ref 22–32)
Calcium: 8.8 mg/dL — ABNORMAL LOW (ref 8.9–10.3)
Chloride: 104 mmol/L (ref 98–111)
Creatinine, Ser: 1.21 mg/dL (ref 0.61–1.24)
GFR calc Af Amer: >60 mL/min (ref >60)
GFR calc non Af Amer: 57 mL/min — ABNORMAL LOW (ref >60)
Glucose, Bld: 185 mg/dL — ABNORMAL HIGH (ref 70–99)
Potassium: 4.3 mmol/L (ref 3.5–5.1)
Sodium: 136 mmol/L (ref 135–145)

## 2018-04-04 LAB — TSH: TSH: 2.963 u[IU]/mL (ref 0.350–4.500)

## 2018-04-04 MED ORDER — METOPROLOL SUCCINATE ER 50 MG PO TB24: 50.0000 mg | ORAL_TABLET | Freq: Every day | ORAL | 11 refills | Status: DC

## 2018-04-04 NOTE — Progress Notes (Signed)
PCP: Primary Cardiologist:  HPI: Mason [name] is a 77 y.o. 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   He has been followed by Drs. Croituro and [employer]. He showed a gradual decline in LV systolic function. An echocardiogram in August 2019 showed an EF of 40% with diffuse hypokinesis. In October he had a nuclear stress test that showed all perfusion but an EF of 32%. Has minor coronary atherosclerosis and normal LV EDP on previous cath in 2017. He had a dual chamber Medtronic Revo pacemaker implanted July 2013. Due to 100% V pacing and a drop in his EF, had CRT-D upgrade on 01/04/17.  He presented to [facility] on 03/22/2018 for scheduled R/L cath due to ongoing dyspnea. Cath showed EF 30-35% with minimal CAD and well preserved hemodynamics. Cath was difficult case due to bilateral radial loops and dilated aortic root but able to be completed from left radial. On arriving to short stay post-cath, patient developed acute onset of LUE weakness and L facial droop. Initially unable to lift arm at all. Code stroke activated. By the time patient taken for emergent CT all deficits essentially resolved. Head CT negative. Seen by stroke team who felt he had very distal embolus with symptom resolution. Xarelto was resumed at discharge.   Today he returns for post hospital follow up. Complaining of fatigue and ongoing dyspnea. Having orthopnea a few nights a week. SOB with steps. Complains of fatigue after walking in the grocery store.  Denies PND. Denies CP. No bleeding problems. He has not had further issues post heart catherization. Weight at home has been stable. Appetite good. Denies fever or chills. Did not take his medications today but plans to take when he gets home.   Optivol: fluid index low. Activity ~3 hours per day. No VT. No A fib.    LHC/RHC 03/22/2018  Left Main  Vessel is normal in caliber. Vessel is angiographically  normal.  Left Anterior Descending  Vessel is normal in caliber. The vessel exhibits minimal luminal irregularities.  Left Circumflex   Vessel is small. Vessel is angiographically normal.   Right Coronary Artery   Vessel is large. Vessel is angiographically normal.   Dist RCA lesion 20% stenosed   Dist RCA lesion is 20% stenosed.   Findings: Ao = 138/85 (107) LV = 133/16 RA = 7 RV = 33/8 PA = 30/10 (22) PCW = 13 Fick cardiac output/index = 4.4/2.0 PVR = 2.0 WU Ao sat = 98% PA sat = 62%, 62% Assessment/Plan: 1. Minimal CAD 2. 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 Well-compensated filling pressures with moderately reduced CO 4. Complicated radial access bilaterally as described above. Suggest femoral approach for any further catheterizations  ECHO 2017  Normal LV size with mild LV hypertrophy. EF 40%, diffuse   hypokinesis. Moderate diastolic dysfunction. Normal RV size with   mildly decreased systolic function.     ROS: All systems negative except as listed in HPI, PMH and Problem List.  SH:  Social History   Socioeconomic History  . Marital status: Married    Spouse name: Not on file  . Number of children: Not on file  . Years of education: Not on file  . Highest education level: Not on file  Occupational History  . Not on file  Social Needs  . Financial resource strain: Not on file  . Food insecurity:    Worry: Not on file  Inability: Not on file  . Transportation needs:    Medical: Not on file    Non-medical: Not on file  Tobacco Use  . Smoking status: Never Smoker  . Smokeless tobacco: Never Used  Substance and Sexual Activity  . Alcohol use: Yes    Comment: occ  . Drug use: No  . Sexual activity: Not on file  Lifestyle  . Physical activity:    Days per week: Not on file    Minutes per session: Not on file  . Stress: Not on file  Relationships  . Social connections:    Talks on phone: Not on file    Gets together: Not on file    Attends religious  service: Not on file    Active member of club or organization: Not on file    Attends meetings of clubs or organizations: Not on file    Relationship status: Not on file  . Intimate partner violence:    Fear of current or ex partner: Not on file    Emotionally abused: Not on file    Physically abused: Not on file    Forced sexual activity: Not on file  Other Topics Concern  . Not on file  Social History Narrative  . Not on file    FH:  Family History  Problem Relation Age of Onset  . Heart failure Mother   . Stroke Mother   . Stroke Father     Past Medical History:  Diagnosis Date  . AICD (automatic cardioverter/defibrillator) present 2018   MDT CRT-D.  Fatigue-->completely pacer dependent.  Pacer settings adjusted 12/2017 to allow more chronotrophc variance with ADL's//exertion.  . Arthritis    Pt is s/p eft reverse total shoulder arthroplasty.  [name] BPH (benign prostatic hypertrophy) 06/2011   Irritative sx's; pt declined trial of anticholinergic per Urology records  . Chronic combined systolic and diastolic heart failure ([place]) 05/31/2012   Nonischemic:  EF 40-45%, LA mod-severe dilated, AFIB.   02/2016 EF 40%, diffuse hypokinesis, grade 2 DD.  Myoc perf imaging showed EF 32% 04/2016.  Pt upgraded to CRT-D 01/04/17.  [name] Chronic renal insufficiency, stage III (moderate) (HCC) 2015   CrCl about 60 ml/min  . Complete heart block (HCC)    Has dual chamber pacer.  . Depression   . DOE (dyspnea on exertion)    NYHA class II/III CHF  . Episodic low back pain 01/22/2013   w/intermittent radiculitis (12/2014 his neurologist referred him to pain mgmt for epidural steroid injection)  . Erectile dysfunction 2019   due to zoloft--urol rx'd viagra  . GERD (gastroesophageal reflux disease)   . H/O tilt table evaluation 11/02/05   negative  . Helicobacter pylori gastritis 01/2016  . High cholesterol   . History of adenomatous polyp of colon 10/12/11   Dr. [name] (3 right side of colon- tubular  adenomas removed)  . History of cardiovascular stress test 05/28/12   no ischemia, EF 37%, imaging results are unchanged and within normal variance  . History of chronic prostatitis   . History of kidney stones   . History of vertigo    + Hx of posterior HA's.  Neuro (Dr. Erling Cruz) eval 2011.  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  . Hypertension   . Lumbar spondylosis    lumbosacral radiculopathy at L4 by EMG testing, right foot drop (neurologist is Dr. Linus Salmons with [facility] in [place])--neurologist referred him to neurosurgery  .  Migraine    "used to have them all the time; none for years" (01/04/2017)  . Myocardial infarction [facility]) ?1970s  . Nephrolithiasis 07/2012   Left UVJ 2 mm stone with dilation of renal collecting system and slight hydroureter on right  . Neuropathy   . NICM (nonischemic cardiomyopathy) ([place])    a. 02/2018 Cath: LM nl, LAD min irregs, LCX no, RCA 20d. CBJS28. Fick CO/CI 4.4/2.0.  . 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  Since no CAD on cath 05/2016, cards recommends upgrade to CRT-D.  [name] Permanent atrial fibrillation ([place])    DCCV 07/09/13-converted, lasted two days, then back into afib--needs lifetime anticoagulation (Xarelto as of 09/2014)  . Prostate cancer screening 09/2017   done by urol annually (normal prostate exam documented + PSA 0.84 as of 10/01/17 urol f/u.  [name] Rectus diastasis   . Right ankle sprain 08/2017   w/distal fibula avulsion fx noted on u/s but not plain film-(Dr. Hudnall).  . Skin cancer of arm, left    "burned it off" (01/04/2017)  . TIA (transient ischemic attack)    a. 03/22/2018 following cath. CT head neg.  . Type II diabetes mellitus ([place])     Current Outpatient Medications  Medication Sig Dispense Refill  . acetaminophen (TYLENOL) 500 MG tablet Take 1,000 mg by mouth every 6 (six) hours  as needed for moderate pain or headache.    . colchicine 0.6 MG tablet Take 0.6 mg by mouth daily as needed (for gout).     . COMBIGAN 0.2-0.5 % ophthalmic solution Place 1 drop into both eyes at bedtime.     . gabapentin (NEURONTIN) 300 MG capsule Take 1 capsule (300 mg total) by mouth 3 (three) times daily. (Patient taking differently: Take 300-600 mg by mouth See admin instructions. Take 600 mg by mouth in the morning and take 300 mg by mouth in the evening) 270 capsule 3  . glipiZIDE (GLUCOTROL XL) 5 MG 24 hr tablet Take 10 mg by mouth daily with breakfast.    . glucose blood test strip Use to check blood sugars 1-2 times daily (Patient not taking: Reported on 03/12/2018) 100 each 3  . Lancets (FREESTYLE) lancets Use as instructed (Patient not taking: Reported on 03/12/2018) 100 each 6  . latanoprost (XALATAN) 0.005 % ophthalmic solution Place 1 drop into both eyes at bedtime.   12  . meclizine (ANTIVERT) 25 MG tablet Take 1 tablet (25 mg total) 3 (three) times daily as needed by mouth for dizziness. 270 tablet 3  . metoprolol succinate (TOPROL-XL) 100 MG 24 hr tablet Take 1 tablet (100 mg total) by mouth daily. Take with or immediately following a meal. 30 tablet 6  . omeprazole (PRILOSEC) 40 MG capsule Take 40 mg by mouth at bedtime.     . rivaroxaban (XARELTO) 20 MG TABS tablet Take 20 mg by mouth at bedtime.     [name] rOPINIRole (REQUIP) 1 MG tablet Take 1 tablet (1 mg total) by mouth daily. (Patient taking differently: Take 1 mg by mouth at bedtime. ) 90 tablet 3  . sacubitril-valsartan (ENTRESTO) 97-103 MG Take 1 tablet by mouth 2 (two) times daily.    . sertraline (ZOLOFT) 100 MG tablet Take 100 mg by mouth daily.    . simvastatin (ZOCOR) 20 MG tablet Take 20 mg by mouth at bedtime.     . sitaGLIPtin (JANUVIA) 100 MG tablet Take 100 mg by mouth at bedtime.  No current facility-administered medications for this encounter.     Vitals:   04/04/18 0954  BP: (!) 128/98  Pulse: 90  SpO2:  97%  Weight: 105.9 kg (233 lb 6.4 oz)    PHYSICAL EXAM: General:   No resp difficulty. Walked in the clinic HEENT: normal Neck: supple. JVP flat. Carotids 2+ bilaterally; no bruits. No lymphadenopathy or thryomegaly appreciated. Cor: PMI normal. Regular rate & rhythm. No rubs, gallops or murmurs. Lungs: clear Abdomen: soft, nontender, nondistended. No hepatosplenomegaly. No bruits or masses. Good bowel sounds. Extremities: no cyanosis, clubbing, rash, edema Neuro: alert & orientedx3, cranial nerves grossly intact. Moves all 4 extremities w/o difficulty. Affect pleasant.      ASSESSMENT & PLAN: 1. Peri-procedural TIA after heart cath 03/22/2018  - likely due to embolus to distal branch of RMCA - - CT negative -Continue xarelto and statin.   2. Systolic heart failure:  - EF dropped after RV pacing.  He has had a CRT-D upgrade 01/04/2017.   - EF still 30-35%. Recent RHC/LHC with normal filling pressures and moderately depressed CO. Minimal CAD.  NYHA IIIb. Volume status stable. Does not need diuretics.  Cut back Toprol XL to 50 mg daily with ongoing fatigue.  - Continue entresto 97-103 mg twice a day - Consider spiro 12.5 mg next visit if renal function ok.  - Consider digoxin after CPX.  - Set up CPX test to further evaluate dyspnea and fatigue.  3.  Permanent atrial fibrillation:  -This patients CHA2DS2-VASc Score and unadjusted Ischemic Stroke Rate (% per year) is equal to 4.8 % stroke rate/year from a score of 4 - Continue xarelto.  20 mg daily.  3. DM2 - hold oral agents.  4. Hypertension: elevated today but has not had medications. No change.  5. CKD 3 - Creatinine elevated post cath.  - Check BMET now.  6. Fatigue Check CBC, TSH, BMET today.  -Set up CPX test.   Follow up in 6 week with Dr Haroldine Laws. Plan to discuss CPX at that time.   Myka Hitz NP-C  10:45 AM

## 2018-04-04 NOTE — Patient Instructions (Signed)
DECREASE Toprol XL to 50 mg once daily. Can half current 100 mg tablets (Take 1/2 tablet once daily). New Rx has been sent to pharmacy for 50 mg tablets   Routine lab work today. Will notify you of abnormal results, otherwise no news is good news!  Will schedule you for Cardiopulmonary Exercise Test. This test is done at our [facility]. Please wear comfortable clothes and shoes for this test. Avoid heavy meal before the test (light snack/meal recommended). Avoid caffeine, alcohol, tobacco products 12 hrs before test. Please give 24 hr notice for cancellations/rescheduling: [phone].  __________________________________________________________________________ Mason [name] Code:  Follow up 6 weeks with Dr. Haroldine Laws.  ___________________________________________________________________________ Mason [name] Code:  Take all medication as prescribed the day of your appointment. Bring all medications with you to your appointment.  Do the following things EVERYDAY: 1) Weigh yourself in the morning before breakfast. Write it down and keep it in a log. 2) Take your medicines as prescribed 3) Eat low salt foods-Limit salt (sodium) to 2000 mg per day.  4) Stay as active as you can everyday 5) Limit all fluids for the day to less than 2 liters

## 2018-04-09 ENCOUNTER — Ambulatory Visit (HOSPITAL_COMMUNITY): Payer: Medicare Other | Attending: Adult Health

## 2018-04-09 DIAGNOSIS — I5022 Chronic systolic (congestive) heart failure: Secondary | ICD-10-CM | POA: Diagnosis not present

## 2018-04-10 ENCOUNTER — Other Ambulatory Visit (HOSPITAL_COMMUNITY): Payer: Self-pay | Admitting: *Deleted

## 2018-04-10 DIAGNOSIS — I5022 Chronic systolic (congestive) heart failure: Secondary | ICD-10-CM | POA: Diagnosis not present

## 2018-04-12 IMAGING — NM NM MISC PROCEDURE
6 series · 36 of 36 positions shown · non-contrast
Comparison: none

[Series 1: wbr_r-proj_st wbr rest · 6.40mm/px · 6 of 64 frames shown]
[frame 6/64]
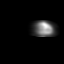
[frame 16/64]
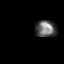
[frame 27/64]
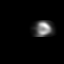
[frame 38/64]
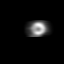
[frame 48/64]
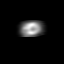
[frame 59/64]
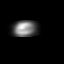

[Series 1: wbr rest · 6.40mm/px · 6 of 64 frames shown]
[frame 6/64]
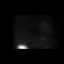
[frame 16/64]
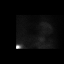
[frame 27/64]
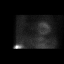
[frame 38/64]
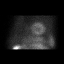
[frame 48/64]
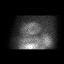
[frame 59/64]
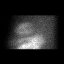

[Series 2: wbr_s-proj_st wbr stress-gsp · 6.40mm/px · 6 of 512 frames shown]
[frame 43/512]
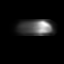
[frame 128/512]
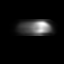
[frame 214/512]
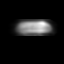
[frame 299/512]
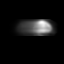
[frame 384/512]
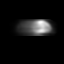
[frame 470/512]
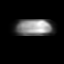

[Series 2: wbr stress-gsp · 6.40mm/px · 6 of 512 frames shown]
[frame 43/512]
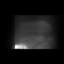
[frame 128/512]
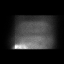
[frame 214/512]
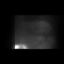
[frame 299/512]
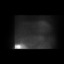
[frame 384/512]
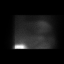
[frame 470/512]
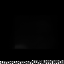

[Series 3: wbr stress-sum-em · 6.40mm/px · 6 of 64 frames shown]
[frame 6/64]
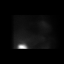
[frame 16/64]
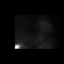
[frame 27/64]
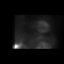
[frame 38/64]
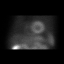
[frame 48/64]
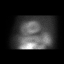
[frame 59/64]
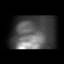

[Series 3: wbr_s-proj_st wbr stress-sum-em · 6.40mm/px · 6 of 64 frames shown]
[frame 6/64]
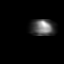
[frame 16/64]
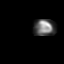
[frame 27/64]
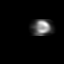
[frame 38/64]
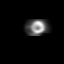
[frame 48/64]
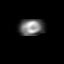
[frame 59/64]
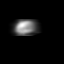

[36 of 36 positions shown; findings below may reference images not displayed]

Canned report from images found in remote index.

Refer to host system for actual result text.

## 2018-04-19 ENCOUNTER — Telehealth (HOSPITAL_COMMUNITY): Payer: Self-pay

## 2018-04-19 NOTE — Telephone Encounter (Signed)
Notes recorded by Effie Berkshire, RN on 04/19/2018 at 9:51 AM EDT Detailed HIPPA confidential VM left on patient's private line with results, to continue current med regimen and diet/fluid restrictions, and follow up appt next month with call back number to CHF clinic office ------  Notes recorded by Conrad Barclay, NP on 04/17/2018 at 11:22 AM EDT Please call with results. ------  Notes recorded by Darrick Grinder D, NP on 04/17/2018 at 11:22 AM EDT Mild Heart Failure limitation. Weight also limiting factor.

## 2018-04-20 ENCOUNTER — Other Ambulatory Visit: Payer: Self-pay | Admitting: Cardiovascular Disease

## 2018-04-20 ENCOUNTER — Other Ambulatory Visit: Payer: Self-pay | Admitting: Family Medicine

## 2018-04-22 ENCOUNTER — Ambulatory Visit: Payer: Medicare Other | Admitting: Cardiovascular Disease

## 2018-04-23 ENCOUNTER — Ambulatory Visit (INDEPENDENT_AMBULATORY_CARE_PROVIDER_SITE_OTHER): Payer: Medicare Other | Admitting: *Deleted

## 2018-04-23 DIAGNOSIS — I5042 Chronic combined systolic (congestive) and diastolic (congestive) heart failure: Secondary | ICD-10-CM

## 2018-04-23 DIAGNOSIS — I442 Atrioventricular block, complete: Secondary | ICD-10-CM

## 2018-04-23 NOTE — Progress Notes (Signed)
Remote ICD transmission.   

## 2018-04-24 ENCOUNTER — Other Ambulatory Visit: Payer: Self-pay

## 2018-04-24 ENCOUNTER — Encounter: Payer: Self-pay | Admitting: Family Medicine

## 2018-04-24 ENCOUNTER — Ambulatory Visit (INDEPENDENT_AMBULATORY_CARE_PROVIDER_SITE_OTHER): Payer: Medicare Other | Admitting: Family Medicine

## 2018-04-24 VITALS — BP 128/90 | HR 78 | Temp 98.0°F | Resp 14 | Ht 71.0 in | Wt 234.0 lb

## 2018-04-24 DIAGNOSIS — Z23 Encounter for immunization: Secondary | ICD-10-CM | POA: Diagnosis not present

## 2018-04-24 DIAGNOSIS — E118 Type 2 diabetes mellitus with unspecified complications: Secondary | ICD-10-CM

## 2018-04-24 MED ORDER — SIMVASTATIN 20 MG PO TABS: 20.0000 mg | ORAL_TABLET | Freq: Every evening | ORAL | 1 refills | Status: DC

## 2018-04-24 MED ORDER — SITAGLIPTIN PHOSPHATE 100 MG PO TABS: 100.0000 mg | ORAL_TABLET | Freq: Every day | ORAL | 1 refills | Status: DC

## 2018-04-24 MED ORDER — GLIPIZIDE ER 10 MG PO TB24: 20.0000 mg | ORAL_TABLET | Freq: Every morning | ORAL | 1 refills | Status: DC

## 2018-04-24 NOTE — Progress Notes (Signed)
OFFICE VISIT  04/28/2018   CC:  Chief Complaint  Patient presents with  . Diabetes  . Hyperlipidemia   HPI:    Patient is a 77 y.o. Caucasian male who presents for f/u DM 2, HLD, CRI III.  DM: last A1c was up over 8% so I increased his glipizide xl to 10mg  qd.  I had him hold his metformin at that time b/c his GFR had dropped below 49ml/min. On 02/25/18 his GFR was back up to 55 ml/min, essentially his baseline. Says his glucoses are "not going down":   Avg fasting 180s. Discussed the fact that getting him back on metformin probably more risk than benefit given his GFR. He needs to start insulin--we discussed this some today.  He wants a couple more weeks to adjust to this thought/prospect, but agrees to follow my recommendations if that's what it takes to get his DM better controlled.     Past Medical History:  Diagnosis Date  . AICD (automatic cardioverter/defibrillator) present 2018   MDT CRT-D.  Fatigue-->completely pacer dependent.  Pacer settings adjusted 12/2017 to allow more chronotrophc variance with ADL's//exertion.  . Arthritis    Pt is s/p eft reverse total shoulder arthroplasty.  [name] BPH (benign prostatic hypertrophy) 06/2011   Irritative sx's; pt declined trial of anticholinergic per Urology records  . Chronic combined systolic and diastolic heart failure ([place]) 05/31/2012   Nonischemic:  EF 40-45%, LA mod-severe dilated, AFIB.   02/2016 EF 40%, diffuse hypokinesis, grade 2 DD.  Myoc perf imaging showed EF 32% 04/2016.  Pt upgraded to CRT-D 01/04/17.  [name] Chronic renal insufficiency, stage III (moderate) (HCC) 2015   CrCl about 60 ml/min  . Complete heart block (HCC)    Has dual chamber pacer.  . Depression   . DOE (dyspnea on exertion)    NYHA class II/III CHF  . Episodic low back pain 01/22/2013   w/intermittent radiculitis (12/2014 his neurologist referred him to pain mgmt for epidural steroid injection)  . Erectile dysfunction 2019   due to zoloft--urol rx'd viagra  .  GERD (gastroesophageal reflux disease)   . H/O tilt table evaluation 11/02/05   negative  . Helicobacter pylori gastritis 01/2016  . High cholesterol   . History of adenomatous polyp of colon 10/12/11   Dr. [name] (3 right side of colon- tubular adenomas removed)  . History of cardiovascular stress test 05/28/12   no ischemia, EF 37%, imaging results are unchanged and within normal variance  . History of chronic prostatitis   . History of kidney stones   . History of vertigo    + Hx of posterior HA's.  Neuro (Dr. Erling Cruz) eval 2011.  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  . Hypertension   . Lumbar spondylosis    lumbosacral radiculopathy at L4 by EMG testing, right foot drop (neurologist is Dr. Linus Salmons with [facility] in [place])--neurologist referred him to neurosurgery  . Migraine    "used to have them all the time; none for years" (01/04/2017)  . Myocardial infarction [facility]) ?1970s  . Nephrolithiasis 07/2012   Left UVJ 2 mm stone with dilation of renal collecting system and slight hydroureter on right  . Neuropathy   . NICM (nonischemic cardiomyopathy) ([place])    a. 02/2018 Cath: LM nl, LAD min irregs, LCX no, RCA 20d. PFXT02. Fick CO/CI 4.4/2.0.  . 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  Since no  CAD on cath 05/2016, cards recommends upgrade to CRT-D.  [name] Permanent atrial fibrillation    DCCV 07/09/13-converted, lasted two days, then back into afib--needs lifetime anticoagulation (Xarelto as of 09/2014)  . Prostate cancer screening 09/2017   done by urol annually (normal prostate exam documented + PSA 0.84 as of 10/01/17 urol f/u.  [name] Rectus diastasis   . Right ankle sprain 08/2017   w/distal fibula avulsion fx noted on u/s but not plain film-(Dr. Hudnall).  . Skin cancer of arm, left    "burned it off" (01/04/2017)  . TIA (transient ischemic  attack)    a. 03/22/2018 following cath. CT head neg.  . Type II diabetes mellitus ([place])     Past Surgical History:  Procedure Laterality Date  . BACK SURGERY    . BI-VENTRICULAR IMPLANTABLE CARDIOVERTER DEFIBRILLATOR  (CRT-D) Left 01/04/2017  . BIV ICD INSERTION CRT-D N/A 01/04/2017   Procedure: BiV ICD ;  Surgeon: Constance Haw, MD;  Location: [place] [facility];  Service: Cardiovascular;  Laterality: N/A;  . CARDIAC CATHETERIZATION N/A 06/14/2016   Minimal nonobstructive dz, EF 25-35%.  Procedure: Left Heart Cath and Coronary Angiography;  Surgeon: Peter M Martinique, MD;  Location: [place] [facility];  Service: Cardiovascular;  Laterality: N/A;  . CARDIOVASCULAR STRESS TEST  2012   2012 nuclear perfusion study: low risk scan; 04/2016 normal myocardial perfusion imaging, EF 32%.  [name] CARDIOVERSION  07/09/2012   Procedure: CARDIOVERSION;  Surgeon: [name], MD;  Location: [place];  Service: Cardiovascular;  Laterality: N/A;  . CATARACT EXTRACTION W/ INTRAOCULAR LENS IMPLANT & ANTERIOR VITRECTOMY, BILATERAL Bilateral   . COLONOSCOPY W/ POLYPECTOMY  approx 2006; repeated 09/2011   Polyps on 2013 EGD as well, repeat 12/2014  . ESOPHAGOGASTRODUODENOSCOPY  10/18/06   Done due to chronic GERD: Normal, bx showed no barrett's esophagus (Dr. [name])  . FLEXOR TENDON REPAIR Left 10/02/2016   Procedure: LEFT RING FINGER WOUND EXPORATION AND FLEXOR TENDON REPAIR AND NERVE REPAIR;  Surgeon: Milly Jakob, MD;  Location: [place];  Service: Orthopedics;  Laterality: Left;  . INSERT / REPLACE / REMOVE PACEMAKER  02/05/2012   dual chamber, sinus node dysfunction, sinus arrest, PAF, Medtronic Revo serial#[id number] H: last checked 05/2015  . LUMBAR LAMINECTOMY Left 1976   L4-5  . PACEMAKER REMOVAL  01/04/2017  . PERMANENT PACEMAKER INSERTION N/A 02/05/2012   Procedure: PERMANENT PACEMAKER INSERTION;  Surgeon: [name], MD; Generator Medtronic Revo model [place] serial number [id number] H Laterality:  N/A;  . RETINAL DETACHMENT SURGERY Left ~ 1999  . REVERSE SHOULDER ARTHROPLASTY Left 2018   Left shoulder reverse [name] in [place]).  [name] RIGHT/LEFT HEART CATH AND CORONARY ANGIOGRAPHY N/A 03/22/2018   Procedure: RIGHT/LEFT HEART CATH AND CORONARY ANGIOGRAPHY;  Surgeon: Jolaine Artist, MD;  Location: [place] [facility];  Service: Cardiovascular;  Laterality: N/A;  . TRANSTHORACIC ECHOCARDIOGRAM  08/25/10; 05/2012; 03/23/16   mild asymmetric LVH, normal systolic function, normal diastolic fxn, mild-to-mod mitral regurg, mild aortic valve sclerosis and trace AI, mild aortic root dilatation. 2014 f/u showed EF 40-45%, mod LAE, A FIB.  02/2016 EF 40%, diffuse hypokinesis, grade 2 DD.    Outpatient Medications Prior to Visit  Medication Sig Dispense Refill  . acetaminophen (TYLENOL) 500 MG tablet Take 1,000 mg by mouth every 6 (six) hours as needed for moderate pain or headache.    . colchicine 0.6 MG tablet Take 0.6 mg by mouth daily as needed (for gout).     . COMBIGAN 0.2-0.5 %  ophthalmic solution Place 1 drop into both eyes at bedtime.     . gabapentin (NEURONTIN) 300 MG capsule Take 1 capsule (300 mg total) by mouth 3 (three) times daily. (Patient taking differently: Take 300-600 mg by mouth See admin instructions. Take 600 mg by mouth in the morning and take 300 mg by mouth in the evening) 270 capsule 3  . glucose blood test strip Use to check blood sugars 1-2 times daily 100 each 3  . Lancets (FREESTYLE) lancets Use as instructed 100 each 6  . latanoprost (XALATAN) 0.005 % ophthalmic solution Place 1 drop into both eyes at bedtime.   12  . meclizine (ANTIVERT) 25 MG tablet Take 1 tablet (25 mg total) 3 (three) times daily as needed by mouth for dizziness. 270 tablet 3  . metoprolol succinate (TOPROL-XL) 50 MG 24 hr tablet Take 1 tablet (50 mg total) by mouth daily. Take with or immediately following a meal. (Patient taking differently: Take 25 mg by mouth daily. Take with or  immediately following a meal. ) 30 tablet 11  . omeprazole (PRILOSEC) 40 MG capsule Take 40 mg by mouth at bedtime.     [name] rOPINIRole (REQUIP) 1 MG tablet Take 1 tablet (1 mg total) by mouth daily. (Patient taking differently: Take 1 mg by mouth at bedtime. ) 90 tablet 3  . sacubitril-valsartan (ENTRESTO) 97-103 MG Take 1 tablet by mouth 2 (two) times daily.    . sertraline (ZOLOFT) 100 MG tablet Take 100 mg by mouth daily.    Alveda Reasons 20 MG TABS tablet TAKE 1 TABLET DAILY WITH SUPPER 90 tablet 1  . glipiZIDE (GLUCOTROL XL) 10 MG 24 hr tablet Take 1 tablet (10 mg total) by mouth every morning. NEEDS OFFICE VISIT FOR MORE REFILLS. (Patient taking differently: Take 20 mg by mouth every morning. NEEDS OFFICE VISIT FOR MORE REFILLS.) 90 tablet 0  . simvastatin (ZOCOR) 20 MG tablet Take 1 tablet (20 mg total) by mouth every evening. NEEDS OFFICE VISIT FOR MORE REFILLS 90 tablet 0  . sitaGLIPtin (JANUVIA) 100 MG tablet Take 1 tablet (100 mg total) by mouth daily. NEEDS OFFICE VISIT FOR MORE REFILLS 90 tablet 0   No facility-administered medications prior to visit.     No Known Allergies  ROS As per HPI  PE: Blood pressure 128/90, pulse 78, temperature 98 F (36.7 C), temperature source Oral, resp. rate 14, height 5\' 11"  (1.803 m), weight 234 lb (106.1 kg), SpO2 95 %. Gen: Alert, well appearing.  Patient is oriented to person, place, time, and situation. AFFECT: pleasant, lucid thought and speech. No further exam today.  LABS:  Lab Results  Component Value Date   TSH 2.963 04/04/2018   Lab Results  Component Value Date   WBC 4.7 04/04/2018   HGB 14.4 04/04/2018   HCT 44.4 04/04/2018   MCV 92.1 04/04/2018   PLT 149 (L) 04/04/2018   Lab Results  Component Value Date   CREATININE 1.21 04/04/2018   BUN 17 04/04/2018   NA 136 04/04/2018   K 4.3 04/04/2018   CL 104 04/04/2018   CO2 25 04/04/2018   Lab Results  Component Value Date   ALT 14 07/27/2017   AST 18 07/27/2017    ALKPHOS 78 07/27/2017   BILITOT 0.5 07/27/2017   Lab Results  Component Value Date   CHOL 137 03/23/2018   Lab Results  Component Value Date   HDL 33 (L) 03/23/2018   Lab Results  Component Value Date  [place] 73 03/23/2018   Lab Results  Component Value Date   TRIG 154 (H) 03/23/2018   Lab Results  Component Value Date   CHOLHDL 4.2 03/23/2018   Lab Results  Component Value Date   HGBA1C 8.1 (H) 02/06/2018    IMPRESSION AND PLAN:  DM 2, control slipping. Continue glipizide XL 10mg  qd and januvia.  Diet/exercise encouraged. Check glucose fasting and 2 H after largest meal for the next couple of weeks until next f/u visit. We'll discuss starting insulin in more detail at f/u visit soon: considering either once daily long acting vs 70/30 bid dosing.  Victoza also an option. Favor choice #1 at this time but we'll see what his 2H PP glucoses are when he returns. No labs needed today. Too early for HbA1c today.  An After Visit Summary was printed and given to the patient.  FOLLOW UP: Keep f/u set for a couple weeks from now.  Signed:  Crissie Sickles, MD           04/28/2018

## 2018-04-28 ENCOUNTER — Other Ambulatory Visit: Payer: Self-pay | Admitting: Family Medicine

## 2018-04-29 LAB — CUP PACEART REMOTE DEVICE CHECK
Battery Remaining Longevity: 80 mo
Battery Voltage: 2.98 V
Brady Statistic AP VP Percent: 0 %
Brady Statistic AP VS Percent: 0 %
Brady Statistic AS VP Percent: 0 %
Brady Statistic AS VS Percent: 0 %
Brady Statistic RA Percent Paced: 0 %
Brady Statistic RV Percent Paced: 99.96 %
Date Time Interrogation Session: 20191001131155
HighPow Impedance: 72 Ohm
Implantable Lead Implant Date: 20130715
Implantable Lead Implant Date: 20180614
Implantable Lead Implant Date: 20180614
Implantable Lead Location: 753858
Implantable Lead Location: 753859
Implantable Lead Location: 753860
Implantable Lead Model: 4598
Implantable Pulse Generator Implant Date: 20180614
Lead Channel Impedance Value: 188.1 Ohm
Lead Channel Impedance Value: 188.1 Ohm
Lead Channel Impedance Value: 188.1 Ohm
Lead Channel Impedance Value: 209 Ohm
Lead Channel Impedance Value: 209 Ohm
Lead Channel Impedance Value: 342 Ohm
Lead Channel Impedance Value: 361 Ohm
Lead Channel Impedance Value: 418 Ohm
Lead Channel Impedance Value: 418 Ohm
Lead Channel Impedance Value: 418 Ohm
Lead Channel Impedance Value: 475 Ohm
Lead Channel Impedance Value: 513 Ohm
Lead Channel Impedance Value: 532 Ohm
Lead Channel Impedance Value: 665 Ohm
Lead Channel Impedance Value: 665 Ohm
Lead Channel Impedance Value: 703 Ohm
Lead Channel Impedance Value: 722 Ohm
Lead Channel Impedance Value: 722 Ohm
Lead Channel Pacing Threshold Amplitude: 0.5 V
Lead Channel Pacing Threshold Amplitude: 0.625 V
Lead Channel Pacing Threshold Pulse Width: 0.4 ms
Lead Channel Pacing Threshold Pulse Width: 1 ms
Lead Channel Sensing Intrinsic Amplitude: 0.625 mV
Lead Channel Setting Pacing Amplitude: 1 V
Lead Channel Setting Pacing Amplitude: 2 V
Lead Channel Setting Pacing Pulse Width: 0.4 ms
Lead Channel Setting Pacing Pulse Width: 1 ms
Lead Channel Setting Sensing Sensitivity: 0.3 mV

## 2018-04-29 NOTE — Telephone Encounter (Signed)
RF request for sertraline.  Do you manage this?  please advise.

## 2018-05-09 ENCOUNTER — Encounter

## 2018-05-09 ENCOUNTER — Encounter: Payer: Self-pay | Admitting: Cardiovascular Disease

## 2018-05-09 ENCOUNTER — Ambulatory Visit (INDEPENDENT_AMBULATORY_CARE_PROVIDER_SITE_OTHER): Payer: Medicare Other | Admitting: Cardiovascular Disease

## 2018-05-09 VITALS — BP 125/84 | HR 74 | Ht 71.0 in | Wt 235.0 lb

## 2018-05-09 DIAGNOSIS — E785 Hyperlipidemia, unspecified: Secondary | ICD-10-CM

## 2018-05-09 DIAGNOSIS — I10 Essential (primary) hypertension: Secondary | ICD-10-CM | POA: Diagnosis not present

## 2018-05-09 DIAGNOSIS — I442 Atrioventricular block, complete: Secondary | ICD-10-CM | POA: Diagnosis not present

## 2018-05-09 DIAGNOSIS — Z8673 Personal history of transient ischemic attack (TIA), and cerebral infarction without residual deficits: Secondary | ICD-10-CM

## 2018-05-09 DIAGNOSIS — Z95810 Presence of automatic (implantable) cardiac defibrillator: Secondary | ICD-10-CM

## 2018-05-09 DIAGNOSIS — I739 Peripheral vascular disease, unspecified: Secondary | ICD-10-CM

## 2018-05-09 DIAGNOSIS — E1169 Type 2 diabetes mellitus with other specified complication: Secondary | ICD-10-CM | POA: Diagnosis not present

## 2018-05-09 DIAGNOSIS — I4821 Permanent atrial fibrillation: Secondary | ICD-10-CM | POA: Diagnosis not present

## 2018-05-09 DIAGNOSIS — E669 Obesity, unspecified: Secondary | ICD-10-CM

## 2018-05-09 DIAGNOSIS — I5022 Chronic systolic (congestive) heart failure: Secondary | ICD-10-CM

## 2018-05-09 NOTE — Patient Instructions (Signed)
Medication Instructions:  Dr Sallyanne Kuster recommends that you continue on your current medications as directed. Please refer to the Current Medication list given to you today.  If you need a refill on your cardiac medications before your next appointment, please call your pharmacy.   Testing/Procedures: ABIs  - Your physician has requested that you have an ankle brachial index (ABI). During this test an ultrasound and blood pressure cuff are used to evaluate the arteries that supply the arms and legs with blood. Allow thirty minutes for this exam. There are no restrictions or special instructions.  Follow-Up: Keep your appointment with Dr Sallyanne Kuster previously scheduled for December 31st.

## 2018-05-09 NOTE — Progress Notes (Signed)
`   Cardiology Office Note    Date:  05/10/2018   ID:  Mason [name], DOB 03-14-1941, MRN [id number]  PCP:  Tammi Sou, MD  Cardiologist:   [name], MD   No chief complaint on file.   History of Present Illness:  Mason [name] is a 76 y.o. male with nonischemic cardiomyopathy, permanent atrial fibrillation and complete heart block who underwent implantation of a CRT-D in June 2018 for deteriorating left ventricular systolic function.  He is pacemaker dependent.  He underwent right and left heart catheterization for optimization of heart failure therapy on August 30.  Right atrial pressure was 7 mmHg, wedge pressure was 13 and PA pressure was 30/10 (mean 22) millimeters Hg.  The cardiac index was 2.0.  The coronary arteries had only minimal atherosclerosis.  Radial artery access was difficult.  On the catheterization he developed transient weakness of the left upper extremity and left facial droop, from which he recovered fully thankfully.  His dose of beta-blocker was reduced.  He is not on diuretics.  He subsequently underwent cardiopulmonary stress testing on September 18 and did quite well with a mildly reduced peak VO2 of 16.8 mL/kg/min (79% of control).  There was still evidence of drug-induced chronotropic incompetence with a maximum heart rate only reached 74% of predicted.    We adjusted his rate response settings, but I think is too soon to assess for improvement.  He is pacemaker dependent, 100% biventricular paced.  He complains of difficulty increasing his exercise.  He generally has no difficulty on level ground, but has noticed that climbing steps is hard.  Interestingly he also describes the fact that calf pain limits his ability to walk fast or uphill.  He occasionally has dizziness, but this happens even when turning over in bed, especially if his eyes are closed, suggesting a central nervous system cause rather than a hemodynamic 1.  He fell a week ago but this was  just a mechanical trip over the end of a trailer.  He did not feel dizzy at the time and did not lose consciousness.  Thankfully he did not have any serious injury and there was no head impact.  He did not have bleeding.  The patient specifically denies any chest pain at rest or exertion, dyspnea at rest, orthopnea, paroxysmal nocturnal dyspnea, syncope, palpitations, focal neurological deficits, lower extremity edema, unexplained weight gain, cough, hemoptysis or wheezing.  He does not have coronary or other vascular disorders, but has numerous risk factors including type 2 diabetes, hyperlipidemia, mild obesity. He has a history of gout.  The cause of Don's cardiomyopathy remains uncertain.  He initially presented with a "heart attack" and was in the ICU in the [place] [facility] for 3 to 4 days in 1976.  He has had a depressed left ventricular systolic function for many years.  Cardiac catheterization most recently performed in 2019 does show nonobstructive atherosclerosis.[name]  He has a Water engineer including 2 stints in [place] and he knows that he was exposed to [employer].   Past Medical History:  Diagnosis Date  . AICD (automatic cardioverter/defibrillator) present 2018   MDT CRT-D.  Fatigue-->completely pacer dependent.  Pacer settings adjusted 12/2017 to allow more chronotrophc variance with ADL's//exertion.  . Arthritis    Pt is s/p eft reverse total shoulder arthroplasty.  [name] BPH (benign prostatic hypertrophy) 06/2011   Irritative sx's; pt declined trial of anticholinergic per Urology records  . Chronic combined systolic  and diastolic heart failure ([place]) 05/31/2012   Nonischemic:  EF 40-45%, LA mod-severe dilated, AFIB.   02/2016 EF 40%, diffuse hypokinesis, grade 2 DD.  Myoc perf imaging showed EF 32% 04/2016.  Pt upgraded to CRT-D 01/04/17.  [name] Chronic renal insufficiency, stage III (moderate) (HCC) 2015   CrCl about 60 ml/min  . Complete heart block (HCC)    Has  dual chamber pacer.  . Depression   . DOE (dyspnea on exertion)    NYHA class II/III CHF  . Episodic low back pain 01/22/2013   w/intermittent radiculitis (12/2014 his neurologist referred him to pain mgmt for epidural steroid injection)  . Erectile dysfunction 2019   due to zoloft--urol rx'd viagra  . GERD (gastroesophageal reflux disease)   . H/O tilt table evaluation 11/02/05   negative  . Helicobacter pylori gastritis 01/2016  . High cholesterol   . History of adenomatous polyp of colon 10/12/11   Dr. [name] (3 right side of colon- tubular adenomas removed)  . History of cardiovascular stress test 05/28/12   no ischemia, EF 37%, imaging results are unchanged and within normal variance  . History of chronic prostatitis   . History of kidney stones   . History of vertigo    + Hx of posterior HA's.  Neuro (Dr. Erling Cruz) eval 2011.  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  . Hypertension   . Lumbar spondylosis    lumbosacral radiculopathy at L4 by EMG testing, right foot drop (neurologist is Dr. Linus Salmons with [facility] in [place])--neurologist referred him to neurosurgery  . Migraine    "used to have them all the time; none for years" (01/04/2017)  . Myocardial infarction [facility]) ?1970s  . Nephrolithiasis 07/2012   Left UVJ 2 mm stone with dilation of renal collecting system and slight hydroureter on right  . Neuropathy   . NICM (nonischemic cardiomyopathy) ([place])    a. 02/2018 Cath: LM nl, LAD min irregs, LCX no, RCA 20d. [id number]. Fick CO/CI 4.4/2.0.  . 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  Since no CAD on cath 05/2016, cards recommends upgrade to CRT-D.  [name] Permanent atrial fibrillation    DCCV 07/09/13-converted, lasted two days, then back into afib--needs lifetime anticoagulation (Xarelto as of 09/2014)  . Prostate cancer screening 09/2017   done  by urol annually (normal prostate exam documented + PSA 0.84 as of 10/01/17 urol f/u.  [name] Rectus diastasis   . Right ankle sprain 08/2017   w/distal fibula avulsion fx noted on u/s but not plain film-(Dr. Hudnall).  . Skin cancer of arm, left    "burned it off" (01/04/2017)  . TIA (transient ischemic attack)    a. 03/22/2018 following cath. CT head neg.  . Type II diabetes mellitus ([place])     Past Surgical History:  Procedure Laterality Date  . BACK SURGERY    . BI-VENTRICULAR IMPLANTABLE CARDIOVERTER DEFIBRILLATOR  (CRT-D) Left 01/04/2017  . BIV ICD INSERTION CRT-D N/A 01/04/2017   Procedure: BiV ICD ;  Surgeon: Constance Haw, MD;  Location: [facility];  Service: Cardiovascular;  Laterality: N/A;  . CARDIAC CATHETERIZATION N/A 06/14/2016   Minimal nonobstructive dz, EF 25-35%.  Procedure: Left Heart Cath and Coronary Angiography;  Surgeon: Peter M Martinique, MD;  Location: [facility];  Service: Cardiovascular;  Laterality: N/A;  . CARDIOVASCULAR STRESS TEST  2012   2012 nuclear perfusion study: low  risk scan; 04/2016 normal myocardial perfusion imaging, EF 32%.  [name] CARDIOVERSION  07/09/2012   Procedure: CARDIOVERSION;  Surgeon: [name], MD;  Location: [facility];  Service: Cardiovascular;  Laterality: N/A;  . CATARACT EXTRACTION W/ INTRAOCULAR LENS IMPLANT & ANTERIOR VITRECTOMY, BILATERAL Bilateral   . COLONOSCOPY W/ POLYPECTOMY  approx 2006; repeated 09/2011   Polyps on 2013 EGD as well, repeat 12/2014  . ESOPHAGOGASTRODUODENOSCOPY  10/18/06   Done due to chronic GERD: Normal, bx showed no barrett's esophagus (Dr. [name])  . FLEXOR TENDON REPAIR Left 10/02/2016   Procedure: LEFT RING FINGER WOUND EXPORATION AND FLEXOR TENDON REPAIR AND NERVE REPAIR;  Surgeon: Milly Jakob, MD;  Location: [facility];  Service: Orthopedics;  Laterality: Left;  . INSERT / REPLACE / REMOVE PACEMAKER  02/05/2012   dual chamber, sinus node dysfunction, sinus arrest, PAF, Medtronic Revo  serial#-[id number] H: last checked 05/2015  . LUMBAR LAMINECTOMY Left 1976   L4-5  . PACEMAKER REMOVAL  01/04/2017  . PERMANENT PACEMAKER INSERTION N/A 02/05/2012   Procedure: PERMANENT PACEMAKER INSERTION;  Surgeon: [name], MD; Generator Medtronic Revo model [place] serial number [id number] H Laterality: N/A;  . RETINAL DETACHMENT SURGERY Left ~ 1999  . REVERSE SHOULDER ARTHROPLASTY Left 2018   Left shoulder reverse [name] in [place]).  [name] RIGHT/LEFT HEART CATH AND CORONARY ANGIOGRAPHY N/A 03/22/2018   Procedure: RIGHT/LEFT HEART CATH AND CORONARY ANGIOGRAPHY;  Surgeon: Jolaine Artist, MD;  Location: [facility];  Service: Cardiovascular;  Laterality: N/A;  . TRANSTHORACIC ECHOCARDIOGRAM  08/25/10; 05/2012; 03/23/16   mild asymmetric LVH, normal systolic function, normal diastolic fxn, mild-to-mod mitral regurg, mild aortic valve sclerosis and trace AI, mild aortic root dilatation. 2014 f/u showed EF 40-45%, mod LAE, A FIB.  02/2016 EF 40%, diffuse hypokinesis, grade 2 DD.    Current Medications: Outpatient Medications Prior to Visit  Medication Sig Dispense Refill  . acetaminophen (TYLENOL) 500 MG tablet Take 1,000 mg by mouth every 6 (six) hours as needed for moderate pain or headache.    . colchicine 0.6 MG tablet Take 0.6 mg by mouth daily as needed (for gout).     . COMBIGAN 0.2-0.5 % ophthalmic solution Place 1 drop into both eyes at bedtime.     . gabapentin (NEURONTIN) 300 MG capsule Take 1 capsule (300 mg total) by mouth 3 (three) times daily. (Patient taking differently: Take 300-600 mg by mouth See admin instructions. Take 600 mg by mouth in the morning and take 300 mg by mouth in the evening) 270 capsule 3  . glipiZIDE (GLUCOTROL XL) 10 MG 24 hr tablet Take 2 tablets (20 mg total) by mouth every morning. NEEDS OFFICE VISIT FOR MORE REFILLS. 180 tablet 1  . glucose blood test strip Use to check blood sugars 1-2 times daily 100 each 3  . Lancets (FREESTYLE)  lancets Use as instructed 100 each 6  . latanoprost (XALATAN) 0.005 % ophthalmic solution Place 1 drop into both eyes at bedtime.   12  . meclizine (ANTIVERT) 25 MG tablet Take 1 tablet (25 mg total) 3 (three) times daily as needed by mouth for dizziness. 270 tablet 3  . metoprolol succinate (TOPROL-XL) 50 MG 24 hr tablet Take 1 tablet (50 mg total) by mouth daily. Take with or immediately following a meal. (Patient taking differently: Take 25 mg by mouth daily. Take with or immediately following a meal. ) 30 tablet 11  . omeprazole (PRILOSEC) 40 MG capsule Take 40 mg by mouth at bedtime.     [name]  rOPINIRole (REQUIP) 1 MG tablet Take 1 tablet (1 mg total) by mouth daily. (Patient taking differently: Take 1 mg by mouth at bedtime. ) 90 tablet 3  . sacubitril-valsartan (ENTRESTO) 97-103 MG Take 1 tablet by mouth 2 (two) times daily.    . sertraline (ZOLOFT) 100 MG tablet TAKE 1 TABLET DAILY 90 tablet 4  . simvastatin (ZOCOR) 20 MG tablet Take 1 tablet (20 mg total) by mouth every evening. NEEDS OFFICE VISIT FOR MORE REFILLS 90 tablet 1  . sitaGLIPtin (JANUVIA) 100 MG tablet Take 1 tablet (100 mg total) by mouth daily. NEEDS OFFICE VISIT FOR MORE REFILLS 90 tablet 1  . XARELTO 20 MG TABS tablet TAKE 1 TABLET DAILY WITH SUPPER 90 tablet 1   No facility-administered medications prior to visit.      Allergies:   Patient has no known allergies.   Social History   Socioeconomic History  . Marital status: Married    Spouse name: Not on file  . Number of children: Not on file  . Years of education: Not on file  . Highest education level: Not on file  Occupational History  . Not on file  Social Needs  . Financial resource strain: Not on file  . Food insecurity:    Worry: Not on file    Inability: Not on file  . Transportation needs:    Medical: Not on file    Non-medical: Not on file  Tobacco Use  . Smoking status: Never Smoker  . Smokeless tobacco: Never Used  Substance and Sexual Activity    . Alcohol use: Yes    Comment: occ  . Drug use: No  . Sexual activity: Not on file  Lifestyle  . Physical activity:    Days per week: Not on file    Minutes per session: Not on file  . Stress: Not on file  Relationships  . Social connections:    Talks on phone: Not on file    Gets together: Not on file    Attends religious service: Not on file    Active member of club or organization: Not on file    Attends meetings of clubs or organizations: Not on file    Relationship status: Not on file  Other Topics Concern  . Not on file  Social History Narrative  . Not on file     Family History:  The patient's family history includes Heart failure in his mother; Stroke in his father and mother.   ROS:   Please see the history of present illness.    ROS all other systems reviewed and are negative   PHYSICAL EXAM:   VS:  BP 125/84   Pulse 74   Ht 5\' 11"  (1.803 m)   Wt 235 lb (106.6 kg)   BMI 32.78 kg/m      General: Alert, oriented x3, no distress, obese, healthy appearing left subclavian pacemaker site Head: no evidence of trauma, PERRL, EOMI, no exophtalmos or lid lag, no myxedema, no xanthelasma; normal ears, nose and oropharynx Neck: normal jugular venous pulsations and no hepatojugular reflux; brisk carotid pulses without delay and no carotid bruits Chest: clear to auscultation, no signs of consolidation by percussion or palpation, normal fremitus, symmetrical and full respiratory excursions Cardiovascular: normal position and quality of the apical impulse, regular rhythm, normal first and second heart sounds, no murmurs, rubs or gallops Abdomen: no tenderness or distention, no masses by palpation, no abnormal pulsatility or arterial bruits, normal bowel sounds, no hepatosplenomegaly  Extremities: no clubbing, cyanosis or edema; 2+ radial, ulnar and brachial pulses bilaterally; 2+ right femoral, posterior tibial and dorsalis pedis pulses; 2+ left femoral, posterior tibial and  dorsalis pedis pulses; no subclavian or femoral bruits Neurological: grossly nonfocal Psych: Normal mood and affect   Wt Readings from Last 3 Encounters:  05/09/18 235 lb (106.6 kg)  04/24/18 234 lb (106.1 kg)  04/04/18 233 lb 6.4 oz (105.9 kg)     Studies/Labs Reviewed:   EKG:  EKG is not ordered today.    Recent Labs: 07/27/2017: ALT 14 04/04/2018: BUN 17; Creatinine, Ser 1.21; Hemoglobin 14.4; Platelets 149; Potassium 4.3; Sodium 136; TSH 2.963   Lipid Panel    Component Value Date/Time   CHOL 137 03/23/2018 0500   TRIG 154 (H) 03/23/2018 0500   HDL 33 (L) 03/23/2018 0500   CHOLHDL 4.2 03/23/2018 0500   VLDL 31 03/23/2018 0500   LDLCALC 73 03/23/2018 0500     ASSESSMENT:    1. Chronic systolic heart failure ([place])   2. Complete heart block (HCC)   3. Permanent atrial fibrillation   4. Biventricular automatic implantable cardioverter defibrillator in situ   5. Essential hypertension   6. Claudication ([place])   7. Intermittent claudication ([place])   8. Diabetes mellitus type 2 in obese ([place])   9. Mild obesity   10. Dyslipidemia   11. History of recent stroke      PLAN:  In order of problems listed above:  1. CHF: On appropriate therapy with RAAS inh and beta blocker.  The dose of beta-blocker was reduced and I think this has led to some improvement.  His maximum achieved heart rate during cardiopulmonary stress testing was only 74% and there is an option to reduce the beta-blocker dose further if necessary.  Otherwise he has excellent hemodynamic compensation.  He was clearly euvolemic by his most recent cardiac catheterization.  Chronotropic incompetence seems to be a significant limitation to his exercise tolerance 2. CHB: He does not have any underlying escape rhythm.  Pacemaker dependent. 3. AFib: Permanent.  On anticoagulation. 4. CRT-D: Normal device function.  Reprogrammed the sensor settings more aggressively for both ADL and exercise at his last visit.  We could  make the rate response settings a little more aggressive at his next appointment, if the heart rate histograms support this. 5. HTN: Excellent control. 6. Intermittent claudication: He describes bilateral calf tightness when he tries to walk up stairs or uphill.  Will check ABIs.  He does have numerous risk factors for PAD. 7. DM/obesity/dyslipidemia: Although he has not had any serious vascular problems, he is at risk for developing these.  Encouraged weight loss.  On statin.  With the exception of a borderline low HDL cholesterol, his other metabolic parameters are reasonably well compensated. 8. CVA: Thankfully without severe sequelae from his recent stroke.  I wonder whether the dizziness that he describes might be some residual effect from the stroke.    Medication Adjustments/Labs and Tests Ordered: Current medicines are reviewed at length with the patient today.  Concerns regarding medicines are outlined above.  Medication changes, Labs and Tests ordered today are listed in the Patient Instructions below. Patient Instructions  Medication Instructions:  Dr Sallyanne Kuster recommends that you continue on your current medications as directed. Please refer to the Current Medication list given to you today.  If you need a refill on your cardiac medications before your next appointment, please call your pharmacy.   Testing/Procedures: ABIs  - Your physician  has requested that you have an ankle brachial index (ABI). During this test an ultrasound and blood pressure cuff are used to evaluate the arteries that supply the arms and legs with blood. Allow thirty minutes for this exam. There are no restrictions or special instructions.  Follow-Up: Keep your appointment with Dr Sallyanne Kuster previously scheduled for December 31st.    Signed, [name], MD  05/10/2018 4:56 PM    [address], [place], [place]  [address] Phone: [phone]; Fax: [phone]

## 2018-05-10 ENCOUNTER — Other Ambulatory Visit: Payer: Self-pay | Admitting: Cardiovascular Disease

## 2018-05-10 DIAGNOSIS — I739 Peripheral vascular disease, unspecified: Secondary | ICD-10-CM

## 2018-05-12 ENCOUNTER — Encounter: Payer: Self-pay | Admitting: Family Medicine

## 2018-05-14 ENCOUNTER — Other Ambulatory Visit: Payer: Medicare Other

## 2018-05-14 ENCOUNTER — Ambulatory Visit (INDEPENDENT_AMBULATORY_CARE_PROVIDER_SITE_OTHER): Payer: Medicare Other | Admitting: Family Medicine

## 2018-05-14 ENCOUNTER — Encounter: Payer: Self-pay | Admitting: Family Medicine

## 2018-05-14 VITALS — BP 131/81 | HR 73 | Temp 98.0°F | Resp 16 | Ht 71.0 in | Wt 231.4 lb

## 2018-05-14 DIAGNOSIS — N183 Chronic kidney disease, stage 3 unspecified: Secondary | ICD-10-CM

## 2018-05-14 DIAGNOSIS — E118 Type 2 diabetes mellitus with unspecified complications: Secondary | ICD-10-CM | POA: Diagnosis not present

## 2018-05-14 LAB — POCT GLYCOSYLATED HEMOGLOBIN (HGB A1C): Hemoglobin A1C: 7.8 % — AB (ref 4.0–5.6)

## 2018-05-14 MED ORDER — PEN NEEDLES 31G X 5 MM MISC: 1.0000 | Freq: Every day | 5 refills | Status: DC

## 2018-05-14 MED ORDER — INSULIN GLARGINE 100 UNIT/ML SOLOSTAR PEN: PEN_INJECTOR | SUBCUTANEOUS | 5 refills | Status: DC

## 2018-05-14 MED ORDER — GLIPIZIDE ER 10 MG PO TB24: ORAL_TABLET | ORAL | 1 refills | Status: DC

## 2018-05-14 NOTE — Patient Instructions (Signed)
Take only ONE of your glipizide tabs every morning.  Check your sugar every morning before eating.  Take your insulin every night before you go to bed.

## 2018-05-14 NOTE — Progress Notes (Signed)
OFFICE VISIT  05/14/2018   CC:  Chief Complaint  Patient presents with  . Follow-up    DM, pt is not fasting.      HPI:    Patient is a 77 y.o. Caucasian male who presents for DM 2 f/u. We've had to d/c his metformin b/c of CRI, and we've been having discussion about getting him on insulin. Since I last saw him his glucoses have ranged from 120s to 170s.  Says he has not been following diabetic diet.    Past Medical History:  Diagnosis Date  . AICD (automatic cardioverter/defibrillator) present 2018   MDT CRT-D.  Fatigue-->completely pacer dependent.  Pacer settings adjusted 12/2017 to allow more chronotrophc variance with ADL's//exertion.  . Arthritis    Pt is s/p eft reverse total shoulder arthroplasty.  [name] BPH (benign prostatic hypertrophy) 06/2011   Irritative sx's; pt declined trial of anticholinergic per Urology records  . Chronic combined systolic and diastolic heart failure ([place]) 05/31/2012   Nonischemic:  EF 40-45%, LA mod-severe dilated, AFIB.   02/2016 EF 40%, diffuse hypokinesis, grade 2 DD.  Myoc perf imaging showed EF 32% 04/2016.  Pt upgraded to CRT-D 01/04/17.  [name] Chronic renal insufficiency, stage III (moderate) (HCC) 2015   CrCl about 60 ml/min  . Complete heart block (HCC)    Has dual chamber pacer.  . Depression   . DOE (dyspnea on exertion)    NYHA class II/III CHF  . Episodic low back pain 01/22/2013   w/intermittent radiculitis (12/2014 his neurologist referred him to pain mgmt for epidural steroid injection)  . Erectile dysfunction 2019   due to zoloft--urol rx'd viagra  . GERD (gastroesophageal reflux disease)   . H/O tilt table evaluation 11/02/05   negative  . Helicobacter pylori gastritis 01/2016  . High cholesterol   . History of adenomatous polyp of colon 10/12/11   Dr. [name] (3 right side of colon- tubular adenomas removed)  . History of cardiovascular stress test 05/28/12   no ischemia, EF 37%, imaging results are unchanged and within normal  variance  . History of chronic prostatitis   . History of kidney stones   . History of vertigo    + Hx of posterior HA's.  Neuro (Dr. Erling Cruz) eval 2011.  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  . Hypertension   . Lumbar spondylosis    lumbosacral radiculopathy at L4 by EMG testing, right foot drop (neurologist is Dr. Linus Salmons with [facility] in [place])--neurologist referred him to neurosurgery  . Migraine    "used to have them all the time; none for years" (01/04/2017)  . Myocardial infarction [facility]) ?1970s   not entirely certain of this  . Nephrolithiasis 07/2012   Left UVJ 2 mm stone with dilation of renal collecting system and slight hydroureter on right  . Neuropathy   . NICM (nonischemic cardiomyopathy) ([place])    a. 02/2018 Cath: LM nl, LAD min irregs, LCX no, RCA 20d. [id number]. Fick CO/CI 4.4/2.0.  . 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  Since no CAD on cath 05/2016, cards recommends upgrade to CRT-D.  [name] Permanent atrial fibrillation    DCCV 07/09/13-converted, lasted two days, then back into afib--needs lifetime anticoagulation (Xarelto as of 09/2014)  . Prostate cancer screening 09/2017   done by urol annually (normal prostate exam documented + PSA 0.84 as of 10/01/17 urol f/u.  [name] Rectus diastasis   .  Right ankle sprain 08/2017   w/distal fibula avulsion fx noted on u/s but not plain film-(Dr. Hudnall).  . Skin cancer of arm, left    "burned it off" (01/04/2017)  . TIA (transient ischemic attack)    a. 03/22/2018 following cath. CT head neg.  . Type II diabetes mellitus ([place])     Past Surgical History:  Procedure Laterality Date  . BACK SURGERY    . BI-VENTRICULAR IMPLANTABLE CARDIOVERTER DEFIBRILLATOR  (CRT-D) Left 01/04/2017  . BIV ICD INSERTION CRT-D N/A 01/04/2017   Procedure: BiV ICD ;  Surgeon: Constance Haw, MD;   Location: [place] [facility];  Service: Cardiovascular;  Laterality: N/A;  . CARDIAC CATHETERIZATION N/A 06/14/2016   Minimal nonobstructive dz, EF 25-35%.  Procedure: Left Heart Cath and Coronary Angiography;  Surgeon: Peter M Martinique, MD;  Location: [place] [facility];  Service: Cardiovascular;  Laterality: N/A;  . CARDIOVASCULAR STRESS TEST  2012   2012 nuclear perfusion study: low risk scan; 04/2016 normal myocardial perfusion imaging, EF 32%.  [name] CARDIOVERSION  07/09/2012   Procedure: CARDIOVERSION;  Surgeon: [name], MD;  Location: [place];  Service: Cardiovascular;  Laterality: N/A;  . CATARACT EXTRACTION W/ INTRAOCULAR LENS IMPLANT & ANTERIOR VITRECTOMY, BILATERAL Bilateral   . COLONOSCOPY W/ POLYPECTOMY  approx 2006; repeated 09/2011   Polyps on 2013 EGD as well, repeat 12/2014  . ESOPHAGOGASTRODUODENOSCOPY  10/18/06   Done due to chronic GERD: Normal, bx showed no barrett's esophagus (Dr. [name])  . FLEXOR TENDON REPAIR Left 10/02/2016   Procedure: LEFT RING FINGER WOUND EXPORATION AND FLEXOR TENDON REPAIR AND NERVE REPAIR;  Surgeon: Milly Jakob, MD;  Location: [place];  Service: Orthopedics;  Laterality: Left;  . INSERT / REPLACE / REMOVE PACEMAKER  02/05/2012   dual chamber, sinus node dysfunction, sinus arrest, PAF, Medtronic Revo serial#[id number] H: last checked 05/2015  . LUMBAR LAMINECTOMY Left 1976   L4-5  . PACEMAKER REMOVAL  01/04/2017  . PERMANENT PACEMAKER INSERTION N/A 02/05/2012   Procedure: PERMANENT PACEMAKER INSERTION;  Surgeon: [name], MD; Generator Medtronic Revo model [place] serial number [id number] H Laterality: N/A;  . RETINAL DETACHMENT SURGERY Left ~ 1999  . REVERSE SHOULDER ARTHROPLASTY Left 2018   Left shoulder reverse [name] in [place]).  [name] RIGHT/LEFT HEART CATH AND CORONARY ANGIOGRAPHY N/A 03/22/2018   EF 30-35%, no CAD.  Procedure: RIGHT/LEFT HEART CATH AND CORONARY ANGIOGRAPHY;  Surgeon: Jolaine Artist, MD;  Location: [place] [facility];  Service: Cardiovascular;  Laterality: N/A;  . TRANSTHORACIC ECHOCARDIOGRAM  08/25/10; 05/2012; 03/23/16;12/2017   mild asymmetric LVH, normal systolic function, normal diastolic fxn, mild-to-mod mitral regurg, mild aortic valve sclerosis and trace AI, mild aortic root dilatation. 2014 f/u showed EF 40-45%, mod LAE, A FIB.  02/2016 EF 40%, diffuse hypokinesis, grade 2 DD. 12/2017 EF 35-40%,diffuse hypokin,grd III DD, mild MR    Outpatient Medications Prior to Visit  Medication Sig Dispense Refill  . acetaminophen (TYLENOL) 500 MG tablet Take 1,000 mg by mouth every 6 (six) hours as needed for moderate pain or headache.    . colchicine 0.6 MG tablet Take 0.6 mg by mouth daily as needed (for gout).     . COMBIGAN 0.2-0.5 % ophthalmic solution Place 1 drop into both eyes at bedtime.     . gabapentin (NEURONTIN) 300 MG capsule Take 1 capsule (300 mg total) by mouth 3 (three) times daily. (Patient taking differently: Take 300-600 mg by mouth See admin instructions. Take 600 mg by mouth in  the morning and take 300 mg by mouth in the evening) 270 capsule 3  . glucose blood test strip Use to check blood sugars 1-2 times daily 100 each 3  . Lancets (FREESTYLE) lancets Use as instructed 100 each 6  . latanoprost (XALATAN) 0.005 % ophthalmic solution Place 1 drop into both eyes at bedtime.   12  . meclizine (ANTIVERT) 25 MG tablet Take 1 tablet (25 mg total) 3 (three) times daily as needed by mouth for dizziness. 270 tablet 3  . metoprolol succinate (TOPROL-XL) 50 MG 24 hr tablet Take 1 tablet (50 mg total) by mouth daily. Take with or immediately following a meal. (Patient taking differently: Take 25 mg by mouth daily. Take with or immediately following a meal. ) 30 tablet 11  . omeprazole (PRILOSEC) 40 MG capsule Take 40 mg by mouth at bedtime.     [name] rOPINIRole (REQUIP) 1 MG tablet Take 1 tablet (1 mg total) by mouth daily. (Patient taking differently: Take 1 mg by mouth at bedtime. ) 90 tablet 3   . sacubitril-valsartan (ENTRESTO) 97-103 MG Take 1 tablet by mouth 2 (two) times daily.    . sertraline (ZOLOFT) 100 MG tablet TAKE 1 TABLET DAILY 90 tablet 4  . simvastatin (ZOCOR) 20 MG tablet Take 1 tablet (20 mg total) by mouth every evening. NEEDS OFFICE VISIT FOR MORE REFILLS 90 tablet 1  . sitaGLIPtin (JANUVIA) 100 MG tablet Take 1 tablet (100 mg total) by mouth daily. NEEDS OFFICE VISIT FOR MORE REFILLS 90 tablet 1  . XARELTO 20 MG TABS tablet TAKE 1 TABLET DAILY WITH SUPPER 90 tablet 1  . glipiZIDE (GLUCOTROL XL) 10 MG 24 hr tablet Take 2 tablets (20 mg total) by mouth every morning. NEEDS OFFICE VISIT FOR MORE REFILLS. 180 tablet 1   No facility-administered medications prior to visit.     No Known Allergies  ROS As per HPI  PE: Blood pressure 131/81, pulse 73, temperature 98 F (36.7 C), temperature source Oral, resp. rate 16, height 5\' 11"  (1.803 m), weight 231 lb 6 oz (105 kg), SpO2 96 %. Gen: Alert, well appearing.  Patient is oriented to person, place, time, and situation. AFFECT: pleasant, lucid thought and speech. NO further exam today.  LABS:  Lab Results  Component Value Date   TSH 2.963 04/04/2018   Lab Results  Component Value Date   WBC 4.7 04/04/2018   HGB 14.4 04/04/2018   HCT 44.4 04/04/2018   MCV 92.1 04/04/2018   PLT 149 (L) 04/04/2018   Lab Results  Component Value Date   CREATININE 1.21 04/04/2018   BUN 17 04/04/2018   NA 136 04/04/2018   K 4.3 04/04/2018   CL 104 04/04/2018   CO2 25 04/04/2018   Lab Results  Component Value Date   ALT 14 07/27/2017   AST 18 07/27/2017   ALKPHOS 78 07/27/2017   BILITOT 0.5 07/27/2017   Lab Results  Component Value Date   CHOL 137 03/23/2018   Lab Results  Component Value Date   HDL 33 (L) 03/23/2018   Lab Results  Component Value Date   LDLCALC 73 03/23/2018   Lab Results  Component Value Date   TRIG 154 (H) 03/23/2018   Lab Results  Component Value Date   CHOLHDL 4.2 03/23/2018    Lab Results  Component Value Date   HGBA1C 7.8 (A) 05/14/2018   POC HbA1c today= 7.8%  IMPRESSION AND PLAN:  1) DM 2, not ideal control.  Goal  A1c for him is 7% or better. POC A1c 7.8% today. Start lantus 10 U SQ qhs.  Decrease glipizide XL to ONE 10 mg tab every morning.  2) CRI: given his comorbidities and high potential for AKI, I would rather keep him off metformin.  An After Visit Summary was printed and given to the patient.  FOLLOW UP: Return in about 2 weeks (around 05/28/2018) for f/u DM 2/insulin.  Signed:  Crissie Sickles, MD           05/14/2018

## 2018-05-15 ENCOUNTER — Encounter (HOSPITAL_COMMUNITY): Payer: Self-pay | Admitting: Internal Medicine

## 2018-05-15 ENCOUNTER — Other Ambulatory Visit: Payer: Self-pay

## 2018-05-15 ENCOUNTER — Ambulatory Visit (HOSPITAL_COMMUNITY)
Admission: RE | Admit: 2018-05-15 | Discharge: 2018-05-15 | Disposition: A | Discharge status: Home / Self Care | Payer: Medicare Other | Source: Ambulatory Visit | Attending: Internal Medicine | Admitting: Internal Medicine

## 2018-05-15 VITALS — BP 148/97 | HR 91 | Wt 234.0 lb

## 2018-05-15 DIAGNOSIS — Z794 Long term (current) use of insulin: Secondary | ICD-10-CM | POA: Insufficient documentation

## 2018-05-15 DIAGNOSIS — I739 Peripheral vascular disease, unspecified: Secondary | ICD-10-CM | POA: Diagnosis not present

## 2018-05-15 DIAGNOSIS — Z79891 Long term (current) use of opiate analgesic: Secondary | ICD-10-CM | POA: Insufficient documentation

## 2018-05-15 DIAGNOSIS — Z950 Presence of cardiac pacemaker: Secondary | ICD-10-CM | POA: Insufficient documentation

## 2018-05-15 DIAGNOSIS — N183 Chronic kidney disease, stage 3 (moderate): Secondary | ICD-10-CM | POA: Insufficient documentation

## 2018-05-15 DIAGNOSIS — E1122 Type 2 diabetes mellitus with diabetic chronic kidney disease: Secondary | ICD-10-CM | POA: Insufficient documentation

## 2018-05-15 DIAGNOSIS — I10 Essential (primary) hypertension: Secondary | ICD-10-CM

## 2018-05-15 DIAGNOSIS — I5022 Chronic systolic (congestive) heart failure: Secondary | ICD-10-CM | POA: Diagnosis not present

## 2018-05-15 DIAGNOSIS — I428 Other cardiomyopathies: Secondary | ICD-10-CM | POA: Diagnosis not present

## 2018-05-15 DIAGNOSIS — I4821 Permanent atrial fibrillation: Secondary | ICD-10-CM | POA: Diagnosis not present

## 2018-05-15 DIAGNOSIS — I130 Hypertensive heart and chronic kidney disease with heart failure and stage 1 through stage 4 chronic kidney disease, or unspecified chronic kidney disease: Secondary | ICD-10-CM | POA: Insufficient documentation

## 2018-05-15 DIAGNOSIS — Z79899 Other long term (current) drug therapy: Secondary | ICD-10-CM | POA: Diagnosis not present

## 2018-05-15 DIAGNOSIS — Z8673 Personal history of transient ischemic attack (TIA), and cerebral infarction without residual deficits: Secondary | ICD-10-CM | POA: Diagnosis not present

## 2018-05-15 DIAGNOSIS — Z7901 Long term (current) use of anticoagulants: Secondary | ICD-10-CM | POA: Insufficient documentation

## 2018-05-15 DIAGNOSIS — I252 Old myocardial infarction: Secondary | ICD-10-CM | POA: Diagnosis not present

## 2018-05-15 DIAGNOSIS — I5042 Chronic combined systolic (congestive) and diastolic (congestive) heart failure: Secondary | ICD-10-CM | POA: Insufficient documentation

## 2018-05-15 DIAGNOSIS — K219 Gastro-esophageal reflux disease without esophagitis: Secondary | ICD-10-CM | POA: Diagnosis not present

## 2018-05-15 DIAGNOSIS — Z8249 Family history of ischemic heart disease and other diseases of the circulatory system: Secondary | ICD-10-CM | POA: Insufficient documentation

## 2018-05-15 DIAGNOSIS — E669 Obesity, unspecified: Secondary | ICD-10-CM | POA: Diagnosis not present

## 2018-05-15 DIAGNOSIS — E1169 Type 2 diabetes mellitus with other specified complication: Secondary | ICD-10-CM

## 2018-05-15 MED ORDER — SPIRONOLACTONE 25 MG PO TABS: 12.5000 mg | ORAL_TABLET | Freq: Every day | ORAL | 6 refills | Status: DC

## 2018-05-15 NOTE — Progress Notes (Signed)
Advanced Heart Failure Clinic Note  PCP: Dr. Shawnie Dapper Primary Cardiologist: Dr. Sallyanne Kuster  HPI: Mason [name] is a 77 y.o. 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   He has been followed by Drs. Croituro and [employer]. He showed a gradual decline in LV systolic function. An echocardiogram in August 2019 showed an EF of 40% with diffuse hypokinesis. In October he had a nuclear stress test that showed all perfusion but an EF of 32%. Has minor coronary atherosclerosis and normal LV EDP on previous cath in 2017. He had a dual chamber Medtronic Revo pacemaker implanted July 2013. Due to 100% V pacing and a drop in his EF, had CRT-D upgrade on 01/04/17.  He presented to [facility] on [facility]/22/2018 for scheduled R/L cath due to ongoing dyspnea. Cath showed EF 30-35% with minimal CAD and well preserved hemodynamics. Cath was difficult case due to bilateral radial loops and dilated aortic root but able to be completed from left radial. On arriving to short stay post-cath, patient developed acute onset of LUE weakness and L facial droop. Initially unable to lift arm at all. Code stroke activated. By the time patient taken for emergent CT all deficits essentially resolved. Head CT negative. Seen by stroke team who felt he had very distal embolus with symptom resolution. Xarelto was resumed at discharge.   He presents today for regular follow up. At last visit, CPX set up as below. This showed mild functional limitation predominantly due to HF. Obesity also a limiting factor. Getting around pretty good. Feels like his SOB has improved. Can walk about 100 yards before he has to stop and take a break to cath his breathing. Fatigued walking around the grocery store. Walking up stairs or an incline is "the worst". Weight at home usually runs around 230-234. Appetite is "too good". Hasn't taken medications this am. Usually takes them around noon.  Denies orthopnea, PND or CP. No lightheadedness.   Optivol: Personally reviewed, Thoracic impedence just below threshold. Fluid index trending upward. Pt activity ~3 hrs daily. No VT/VF.   Echo 12/2017 LVEF 35-40%  CPX 04/10/18 Pre-Exercise PFTs  FVC 3.69 (87%)    FEV1 2.69 (85%)     FEV1/FVC 73 (97%)     MVV 110 (97%)    Exercise Time:  9:15  Speed (mph): 3.0    Grade (%): 7.5   RPE: 18 Resting HR: 66 Peak HR: 107  (74% age predicted max HR) BP rest: 102/68 BP peak: 174/72 Peak VO2: 16.8 (79% predicted peak VO2) When corrected for IBW of 180.3 lb (81.8 kg) the peak VO2 is 21.3 ml/kg (ibw)/min (84% of the ibw-adjusted predicted) VE/VCO2 slope: 36 OUES: 1.72 Peak RER: 1.12 Ventilatory Threshold: 13.2 (62% predicted and 79% measured peak VO2) Peak RR 26 Peak Ventilation: 49.5 VE/MVV: 45% PETCO2 at peak: 34 O2pulse: 16  (107% predicted O2pulse)  LHC/RHC 03/22/2018  Left Main  Vessel is normal in caliber. Vessel is angiographically normal.  Left Anterior Descending  Vessel is normal in caliber. The vessel exhibits minimal luminal irregularities.  Left Circumflex   Vessel is small. Vessel is angiographically normal.   Right Coronary Artery   Vessel is large. Vessel is angiographically normal.   Dist RCA lesion 20% stenosed   Dist RCA lesion is 20% stenosed.   Findings: Ao = 138/85 (107) LV = 133/16 RA = 7 RV = 33/8 PA = 30/10 (22) PCW = 13  Fick cardiac output/index = 4.4/2.0 PVR = 2.0 WU Ao sat = 98% PA sat = 62%, 62% Assessment/Plan: 1. Minimal CAD 2. 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 Well-compensated filling pressures with moderately reduced CO 4. Complicated radial access bilaterally as described above. Suggest femoral approach for any further catheterizations  ECHO 2017  Normal LV size with mild LV hypertrophy. EF 40%, diffuse   hypokinesis. Moderate diastolic dysfunction. Normal RV size with   mildly decreased systolic function.  Review  of systems complete and found to be negative unless listed in HPI.    SH:  Social History   Socioeconomic History  . Marital status: Married    Spouse name: Not on file  . Number of children: Not on file  . Years of education: Not on file  . Highest education level: Not on file  Occupational History  . Not on file  Social Needs  . Financial resource strain: Not on file  . Food insecurity:    Worry: Not on file    Inability: Not on file  . Transportation needs:    Medical: Not on file    Non-medical: Not on file  Tobacco Use  . Smoking status: Never Smoker  . Smokeless tobacco: Never Used  Substance and Sexual Activity  . Alcohol use: Yes    Comment: occ  . Drug use: No  . Sexual activity: Not on file  Lifestyle  . Physical activity:    Days per week: Not on file    Minutes per session: Not on file  . Stress: Not on file  Relationships  . Social connections:    Talks on phone: Not on file    Gets together: Not on file    Attends religious service: Not on file    Active member of club or organization: Not on file    Attends meetings of clubs or organizations: Not on file    Relationship status: Not on file  . Intimate partner violence:    Fear of current or ex partner: Not on file    Emotionally abused: Not on file    Physically abused: Not on file    Forced sexual activity: Not on file  Other Topics Concern  . Not on file  Social History Narrative  . Not on file   FH:  Family History  Problem Relation Age of Onset  . Heart failure Mother   . Stroke Mother   . Stroke Father    Past Medical History:  Diagnosis Date  . AICD (automatic cardioverter/defibrillator) present 2018   MDT CRT-D.  Fatigue-->completely pacer dependent.  Pacer settings adjusted 12/2017 to allow more chronotrophc variance with ADL's//exertion.  . Arthritis    Pt is s/p eft reverse total shoulder arthroplasty.  [name] BPH (benign prostatic hypertrophy) 06/2011   Irritative sx's; pt declined  trial of anticholinergic per Urology records  . Chronic combined systolic and diastolic heart failure ([place]) 05/31/2012   Nonischemic:  EF 40-45%, LA mod-severe dilated, AFIB.   02/2016 EF 40%, diffuse hypokinesis, grade 2 DD.  Myoc perf imaging showed EF 32% 04/2016.  Pt upgraded to CRT-D 01/04/17.  [name] Chronic renal insufficiency, stage III (moderate) (HCC) 2015   CrCl about 60 ml/min  . Complete heart block (HCC)    Has dual chamber pacer.  . Depression   . DOE (dyspnea on exertion)    NYHA class II/III CHF  . Episodic low back pain 01/22/2013   w/intermittent radiculitis (12/2014 his neurologist referred him to pain  mgmt for epidural steroid injection)  . Erectile dysfunction 2019   due to zoloft--urol rx'd viagra  . GERD (gastroesophageal reflux disease)   . H/O tilt table evaluation 11/02/05   negative  . Helicobacter pylori gastritis 01/2016  . High cholesterol   . History of adenomatous polyp of colon 10/12/11   Dr. [name] (3 right side of colon- tubular adenomas removed)  . History of cardiovascular stress test 05/28/12   no ischemia, EF 37%, imaging results are unchanged and within normal variance  . History of chronic prostatitis   . History of kidney stones   . History of vertigo    + Hx of posterior HA's.  Neuro (Dr. Erling Cruz) eval 2011.  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  . Hypertension   . Lumbar spondylosis    lumbosacral radiculopathy at L4 by EMG testing, right foot drop (neurologist is Dr. Linus Salmons with [facility] in [place])--neurologist referred him to neurosurgery  . Migraine    "used to have them all the time; none for years" (01/04/2017)  . Myocardial infarction [facility]) ?1970s   not entirely certain of this  . Nephrolithiasis 07/2012   Left UVJ 2 mm stone with dilation of renal collecting system and slight hydroureter on right  . Neuropathy   . NICM (nonischemic cardiomyopathy) ([place])     a. 02/2018 Cath: LM nl, LAD min irregs, LCX no, RCA 20d. [id number]. Fick CO/CI 4.4/2.0.  . 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  Since no CAD on cath 05/2016, cards recommends upgrade to CRT-D.  [name] Permanent atrial fibrillation    DCCV 07/09/13-converted, lasted two days, then back into afib--needs lifetime anticoagulation (Xarelto as of 09/2014)  . Prostate cancer screening 09/2017   done by urol annually (normal prostate exam documented + PSA 0.84 as of 10/01/17 urol f/u.  [name] Rectus diastasis   . Right ankle sprain 08/2017   w/distal fibula avulsion fx noted on u/s but not plain film-(Dr. Hudnall).  . Skin cancer of arm, left    "burned it off" (01/04/2017)  . TIA (transient ischemic attack)    a. 03/22/2018 following cath. CT head neg.  . Type II diabetes mellitus ([place])    Current Outpatient Medications  Medication Sig Dispense Refill  . acetaminophen (TYLENOL) 500 MG tablet Take 1,000 mg by mouth every 6 (six) hours as needed for moderate pain or headache.    . colchicine 0.6 MG tablet Take 0.6 mg by mouth daily as needed (for gout).     . COMBIGAN 0.2-0.5 % ophthalmic solution Place 1 drop into both eyes at bedtime.     . gabapentin (NEURONTIN) 300 MG capsule Take 1 capsule (300 mg total) by mouth 3 (three) times daily. (Patient taking differently: Take 300-600 mg by mouth See admin instructions. Take 600 mg by mouth in the morning and take 300 mg by mouth in the evening) 270 capsule 3  . glipiZIDE (GLUCOTROL XL) 10 MG 24 hr tablet 1 tab po qAM 90 tablet 1  . glucose blood test strip Use to check blood sugars 1-2 times daily 100 each 3  . Insulin Glargine (LANTUS SOLOSTAR) 100 UNIT/ML Solostar Pen 10 units SQ qhs 5 pen 5  . Insulin Pen Needle (PEN NEEDLES) 31G X 5 MM MISC 1 each by Does not apply route daily. 100 each 5  . Lancets (FREESTYLE) lancets Use as instructed 100 each 6  .  latanoprost (XALATAN) 0.005 % ophthalmic  solution Place 1 drop into both eyes at bedtime.   12  . meclizine (ANTIVERT) 25 MG tablet Take 1 tablet (25 mg total) 3 (three) times daily as needed by mouth for dizziness. 270 tablet 3  . metoprolol succinate (TOPROL-XL) 50 MG 24 hr tablet Take 1 tablet (50 mg total) by mouth daily. Take with or immediately following a meal. (Patient taking differently: Take 25 mg by mouth daily. Take with or immediately following a meal. ) 30 tablet 11  . omeprazole (PRILOSEC) 40 MG capsule Take 40 mg by mouth at bedtime.     [name] rOPINIRole (REQUIP) 1 MG tablet Take 1 tablet (1 mg total) by mouth daily. (Patient taking differently: Take 1 mg by mouth at bedtime. ) 90 tablet 3  . sacubitril-valsartan (ENTRESTO) 97-103 MG Take 1 tablet by mouth 2 (two) times daily.    . sertraline (ZOLOFT) 100 MG tablet TAKE 1 TABLET DAILY 90 tablet 4  . simvastatin (ZOCOR) 20 MG tablet Take 1 tablet (20 mg total) by mouth every evening. NEEDS OFFICE VISIT FOR MORE REFILLS 90 tablet 1  . sitaGLIPtin (JANUVIA) 100 MG tablet Take 1 tablet (100 mg total) by mouth daily. NEEDS OFFICE VISIT FOR MORE REFILLS 90 tablet 1  . XARELTO 20 MG TABS tablet TAKE 1 TABLET DAILY WITH SUPPER 90 tablet 1   No current facility-administered medications for this encounter.    Vitals:   05/15/18 1101  BP: (!) 148/97  Pulse: 91  SpO2: 98%  Weight: 106.1 kg (234 lb)   Wt Readings from Last 3 Encounters:  05/15/18 106.1 kg (234 lb)  05/14/18 105 kg (231 lb 6 oz)  05/09/18 106.6 kg (235 lb)   PHYSICAL EXAM: General: Well appearing. No resp difficulty. HEENT: Normal anicteric  Neck: Supple. JVP ~8-9cm. Carotids 2+ bilat; no bruits. No thyromegaly or nodule noted. Cor: PMI nondisplaced. RRR, No M/G/R noted Lungs: CTAB, normal effort. No wheeze Abdomen: Obese, soft, non-tender, non-distended, no HSM. No bruits or masses. +BS  Extremities: No cyanosis, clubbing, or rash. R and LLE no edema. Good pedal pulses. + varicose veins. Neuro: alert &  oriented x 3, cranial nerves grossly intact. moves all 4 extremities w/o difficulty. Affect pleasant   ASSESSMENT & PLAN: 1. Peri-procedural TIA after heart cath 03/22/2018  - Likely due to embolus to distal branch of RMCA - CT negative - Continue Xarelto and statin.  2. Chronic Systolic heart failure:  - EF dropped after RV pacing.  He has had a CRT-D upgrade 01/04/2017.   - Echo 12/2017 LVEF 35-40% - R/LHC 03/22/18 with normal filling pressures and moderately depressed CO. Minimal CAD.  - NYHA II-early III symptoms. - Volume status looks OK - Not on diuretics.  - Continue Toprol XL 50 mg daily. Intolerant to uptitration with fatigue.  - Continue entresto 97-103 mg twice a day  - Will add spiro 12.5 mg daily. Will need BMET/labwork at follow up PCP visit.  - No need for digoxin.  - CPX 03/2018 with minimal limitation from HF and obesity.  3.  Permanent atrial fibrillation:  - Rate controlled -This patients CHA2DS2-VASc Score and unadjusted Ischemic Stroke Rate (% per year) is equal to 4.8 % stroke rate/year from a score of 4 - Continue xarelto 20 mg daily. No bleeding.  3. DM2 - Hgb A1C 7.8 05/14/18 - Per PCP.  - Would consider switch Januvia to Jardiance. Dr. Haroldine Laws to discuss with Dr. Anitra Lauth 4. Hypertension:  -  Meds as above.  5. CKD 3 - Creatinine baseline 1.2 - 1.4  6. Fatigue - CBC, TSH, and BMET stable 04/04/18.  7. H/o claudication - ABIs scheduled for next week. Good pedal pulses  Shirley Friar, PA-C  10:57 AM  Patient seen and examined with the above-signed Advanced Practice Provider and/or Housestaff. I personally reviewed laboratory data, imaging studies and relevant notes. I independently examined the patient and formulated the important aspects of the plan. I have edited the note to reflect any of my changes or salient points. I have personally discussed the plan with the patient and/or family.  Cath and CPX results reviewed with him. Seems much  improved after CRT-D upgrade. NYHA II. CPX results reassuring. Has mild volume ovverload on exam and by optivol. Add spiro. Asked him to discuss with his PCP switching Januvia to [place] given recent data.   [name], MD  5:44 PM

## 2018-05-15 NOTE — Patient Instructions (Signed)
Start Spironolactone 12.5mg  (1/2 tab) daily  Your physician recommends that you schedule a follow-up appointment in: 3 months

## 2018-05-16 ENCOUNTER — Telehealth: Payer: Self-pay | Admitting: Family Medicine

## 2018-05-16 NOTE — Telephone Encounter (Signed)
PT states that he was put on insulin by Dr. Anitra Lauth but no one told him that it had to be administered by needle.  Pt states that he hasn't started anything yet and won't be able to until at least Monday.  Pt wants to know how expensive this is going to get and speak with someone to understand what is going on.

## 2018-05-17 NOTE — Telephone Encounter (Signed)
11:25am Patient calling to check status of getting insulin pen needles to take insulin. Advised patient that they were sent with the insulin prescription on 05/14/18.  11:30am [place]. Spoke with [employer] and she states that they have the Insulin Pen Needle (PEN NEEDLES) 31G X 5 MM MISC ready for him to pick up.

## 2018-05-17 NOTE — Telephone Encounter (Signed)
11:43am- Called patient once off the phone with Pharmacy and advised patient that Beth at the pharmacy says that his insulin pen needles are there and ready for pick up at [facility] in [place].

## 2018-05-17 NOTE — Telephone Encounter (Signed)
Left message for pt to call back  °

## 2018-05-20 ENCOUNTER — Ambulatory Visit (HOSPITAL_COMMUNITY)
Admission: RE | Admit: 2018-05-20 | Discharge: 2018-05-20 | Disposition: A | Discharge status: Home / Self Care | Payer: Medicare Other | Source: Ambulatory Visit | Attending: Cardiovascular Disease | Admitting: Cardiovascular Disease

## 2018-05-20 DIAGNOSIS — I739 Peripheral vascular disease, unspecified: Secondary | ICD-10-CM | POA: Diagnosis not present

## 2018-05-21 ENCOUNTER — Other Ambulatory Visit: Payer: Self-pay | Admitting: Family Medicine

## 2018-05-21 HISTORY — PX: OTHER SURGICAL HISTORY: SHX169

## 2018-05-21 MED ORDER — EMPAGLIFLOZIN 10 MG PO TABS: 10.0000 mg | ORAL_TABLET | Freq: Every day | ORAL | 1 refills | Status: DC

## 2018-05-21 NOTE — Telephone Encounter (Signed)
Left message for pt to call back  °

## 2018-05-21 NOTE — Telephone Encounter (Signed)
SW pt, he stated that he has p/u insulin and has been giving himself 10 units at bedtime. He did mention that his FBS has been running higher than normal. He has also been off of the [place] since 05/16/18, he stated that his cardiologist told him to stop the Januvia and start spironolactone.  I reviewed note from cardiologist (ov 05/15/18) looks like he wanted to discuss changing Januvia to [place].  Pt is confused and would like some clarification.   Please advise. Thanks.

## 2018-05-21 NOTE — Telephone Encounter (Signed)
OK, notes reviewed. Continue 10 U lantus qhs. Stop Januvia, and I'm going to send in a pill called Jardiance to take instead of the [place]. He should keep taking his spironolactone that he was recently started on by the heart doctor, but remind him this is a medication to help his heart, NOT his diabetes. Make sure he is checking glucose every morning and 2 hours after his largest meal of the day and WRITING THEM DOWN SO HE CAN REVIEW THEM WITH ME AT FOLLOW UP.  TELL HIM NOT TO RELY ON HIS MEMORY FOR THIS! -thx

## 2018-05-21 NOTE — Telephone Encounter (Signed)
Pt advised and voiced understanding.   

## 2018-05-22 ENCOUNTER — Encounter: Payer: Self-pay | Admitting: Family Medicine

## 2018-05-23 ENCOUNTER — Other Ambulatory Visit: Payer: Self-pay | Admitting: Cardiovascular Disease

## 2018-05-23 NOTE — Telephone Encounter (Signed)
Rx has been sent to the pharmacy electronically. ° °

## 2018-05-28 ENCOUNTER — Other Ambulatory Visit: Payer: Self-pay

## 2018-05-28 MED ORDER — GLUCOSE BLOOD VI STRP: ORAL_STRIP | 3 refills | Status: DC

## 2018-05-29 ENCOUNTER — Encounter: Payer: Self-pay | Admitting: Family Medicine

## 2018-05-29 ENCOUNTER — Ambulatory Visit (INDEPENDENT_AMBULATORY_CARE_PROVIDER_SITE_OTHER): Payer: Medicare Other | Admitting: Family Medicine

## 2018-05-29 VITALS — BP 114/72 | HR 83 | Temp 97.5°F | Resp 16 | Ht 71.0 in | Wt 224.2 lb

## 2018-05-29 DIAGNOSIS — E118 Type 2 diabetes mellitus with unspecified complications: Secondary | ICD-10-CM | POA: Diagnosis not present

## 2018-05-29 MED ORDER — INSULIN GLARGINE 100 UNIT/ML SOLOSTAR PEN: PEN_INJECTOR | SUBCUTANEOUS | 5 refills | Status: DC

## 2018-05-29 MED ORDER — GLIPIZIDE ER 10 MG PO TB24: ORAL_TABLET | ORAL | 1 refills | Status: DC

## 2018-05-29 NOTE — Progress Notes (Signed)
OFFICE VISIT  05/29/2018   CC:  Chief Complaint  Patient presents with  . Follow-up    DM/insulin    HPI:    Patient is a 77 y.o. Caucasian male who presents for 2 week f/u DM 2. Last visit I rx'd him lantus 10 U qhs, also changed [place] to jardiance 10mg  qd.  Glucoses at home: fastings 130s-300, mostly upper 100s or low 200s.  He admits his diet and activity level are variable from day to day, likely accounting for the variability in glucoses. No low glucoses.  No side effects from meds. He is not sure if he is taking his glipizide as I told him (1 tab qAM).   Past Medical History:  Diagnosis Date  . AICD (automatic cardioverter/defibrillator) present 2018   MDT CRT-D.  Fatigue-->completely pacer dependent.  Pacer settings adjusted 12/2017 to allow more chronotrophc variance with ADL's//exertion.  . Arthritis    Pt is s/p eft reverse total shoulder arthroplasty.  [name] BPH (benign prostatic hypertrophy) 06/2011   Irritative sx's; pt declined trial of anticholinergic per Urology records  . Chronic combined systolic and diastolic heart failure ([place]) 05/31/2012   Nonischemic:  EF 40-45%, LA mod-severe dilated, AFIB.   02/2016 EF 40%, diffuse hypokinesis, grade 2 DD.  Myoc perf imaging showed EF 32% 04/2016.  Pt upgraded to CRT-D 01/04/17.  [name] Chronic renal insufficiency, stage III (moderate) (HCC) 2015   CrCl about 60 ml/min  . Complete heart block (HCC)    Has dual chamber pacer.  . Depression   . DOE (dyspnea on exertion)    NYHA class II/III CHF  . Episodic low back pain 01/22/2013   w/intermittent radiculitis (12/2014 his neurologist referred him to pain mgmt for epidural steroid injection)  . Erectile dysfunction 2019   due to zoloft--urol rx'd viagra  . GERD (gastroesophageal reflux disease)   . H/O tilt table evaluation 11/02/05   negative  . Helicobacter pylori gastritis 01/2016  . High cholesterol   . History of adenomatous polyp of colon 10/12/11   Dr. [name] (3 right side  of colon- tubular adenomas removed)  . History of cardiovascular stress test 05/28/12   no ischemia, EF 37%, imaging results are unchanged and within normal variance  . History of chronic prostatitis   . History of kidney stones   . History of vertigo    + Hx of posterior HA's.  Neuro (Dr. Erling Cruz) eval 2011.  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  . Hypertension   . Lumbar spondylosis    lumbosacral radiculopathy at L4 by EMG testing, right foot drop (neurologist is Dr. Linus Salmons with [facility] in [place])--neurologist referred him to neurosurgery  . Migraine    "used to have them all the time; none for years" (01/04/2017)  . Myocardial infarction [facility]) ?1970s   not entirely certain of this  . Nephrolithiasis 07/2012   Left UVJ 2 mm stone with dilation of renal collecting system and slight hydroureter on right  . Neuropathy   . NICM (nonischemic cardiomyopathy) ([place])    a. 02/2018 Cath: LM nl, LAD min irregs, LCX no, RCA 20d. [id number]. Fick CO/CI 4.4/2.0.  . 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  Since no CAD on cath 05/2016, cards recommends upgrade to CRT-D.  [name] Permanent atrial fibrillation    DCCV 07/09/13-converted, lasted two days, then back into afib--needs lifetime anticoagulation (Xarelto as of 09/2014)  .  Prostate cancer screening 09/2017   done by urol annually (normal prostate exam documented + PSA 0.84 as of 10/01/17 urol f/u.  [name] Rectus diastasis   . Right ankle sprain 08/2017   w/distal fibula avulsion fx noted on u/s but not plain film-(Dr. Hudnall).  . Skin cancer of arm, left    "burned it off" (01/04/2017)  . TIA (transient ischemic attack)    a. 03/22/2018 following cath. CT head neg.  . Type II diabetes mellitus ([place])     Past Surgical History:  Procedure Laterality Date  . ABI's Bilateral 05/21/2018   normal  . BACK  SURGERY    . BI-VENTRICULAR IMPLANTABLE CARDIOVERTER DEFIBRILLATOR  (CRT-D) Left 01/04/2017  . BIV ICD INSERTION CRT-D N/A 01/04/2017   Procedure: BiV ICD ;  Surgeon: Constance Haw, MD;  Location: [place] [facility];  Service: Cardiovascular;  Laterality: N/A;  . CARDIAC CATHETERIZATION N/A 06/14/2016   Minimal nonobstructive dz, EF 25-35%.  Procedure: Left Heart Cath and Coronary Angiography;  Surgeon: Peter M Martinique, MD;  Location: [place] [facility];  Service: Cardiovascular;  Laterality: N/A;  . CARDIOVASCULAR STRESS TEST  2012   2012 nuclear perfusion study: low risk scan; 04/2016 normal myocardial perfusion imaging, EF 32%.  [name] CARDIOVERSION  07/09/2012   Procedure: CARDIOVERSION;  Surgeon: [name], MD;  Location: [place];  Service: Cardiovascular;  Laterality: N/A;  . CATARACT EXTRACTION W/ INTRAOCULAR LENS IMPLANT & ANTERIOR VITRECTOMY, BILATERAL Bilateral   . COLONOSCOPY W/ POLYPECTOMY  approx 2006; repeated 09/2011   Polyps on 2013 EGD as well, repeat 12/2014  . ESOPHAGOGASTRODUODENOSCOPY  10/18/06   Done due to chronic GERD: Normal, bx showed no barrett's esophagus (Dr. [name])  . FLEXOR TENDON REPAIR Left 10/02/2016   Procedure: LEFT RING FINGER WOUND EXPORATION AND FLEXOR TENDON REPAIR AND NERVE REPAIR;  Surgeon: Milly Jakob, MD;  Location: [place];  Service: Orthopedics;  Laterality: Left;  . INSERT / REPLACE / REMOVE PACEMAKER  02/05/2012   dual chamber, sinus node dysfunction, sinus arrest, PAF, Medtronic Revo serial#-[id number] H: last checked 05/2015  . LUMBAR LAMINECTOMY Left 1976   L4-5  . PACEMAKER REMOVAL  01/04/2017  . PERMANENT PACEMAKER INSERTION N/A 02/05/2012   Procedure: PERMANENT PACEMAKER INSERTION;  Surgeon: [name], MD; Generator Medtronic Revo model [place] serial number [id number] H Laterality: N/A;  . RETINAL DETACHMENT SURGERY Left ~ 1999  . REVERSE SHOULDER ARTHROPLASTY Left 2018   Left shoulder reverse [name] [place]  [name]  RIGHT/LEFT HEART CATH AND CORONARY ANGIOGRAPHY N/A 03/22/2018   EF 30-35%, no CAD.  Procedure: RIGHT/LEFT HEART CATH AND CORONARY ANGIOGRAPHY;  Surgeon: Jolaine Artist, MD;  Location: [place] [facility];  Service: Cardiovascular;  Laterality: N/A;  . TRANSTHORACIC ECHOCARDIOGRAM  08/25/10; 05/2012; 03/23/16;12/2017   mild asymmetric LVH, normal systolic function, normal diastolic fxn, mild-to-mod mitral regurg, mild aortic valve sclerosis and trace AI, mild aortic root dilatation. 2014 f/u showed EF 40-45%, mod LAE, A FIB.  02/2016 EF 40%, diffuse hypokinesis, grade 2 DD. 12/2017 EF 35-40%,diffuse hypokin,grd III DD, mild MR    Outpatient Medications Prior to Visit  Medication Sig Dispense Refill  . acetaminophen (TYLENOL) 500 MG tablet Take 1,000 mg by mouth every 6 (six) hours as needed for moderate pain or headache.    . colchicine 0.6 MG tablet Take 0.6 mg by mouth daily as needed (for gout).     . COMBIGAN 0.2-0.5 % ophthalmic solution Place 1 drop into both eyes at bedtime.     [name]  empagliflozin (JARDIANCE) 10 MG TABS tablet Take 10 mg by mouth daily. 30 tablet 1  . ENTRESTO 97-103 MG TAKE 1 TABLET TWICE A DAY 180 tablet 3  . gabapentin (NEURONTIN) 300 MG capsule Take 1 capsule (300 mg total) by mouth 3 (three) times daily. 270 capsule 3  . glucose blood test strip Use to check blood sugars 1-2 times daily 100 each 3  . Insulin Glargine (LANTUS SOLOSTAR) 100 UNIT/ML Solostar Pen 10 units SQ qhs 5 pen 5  . Insulin Pen Needle (PEN NEEDLES) 31G X 5 MM MISC 1 each by Does not apply route daily. 100 each 5  . Lancets (FREESTYLE) lancets Use as instructed 100 each 6  . latanoprost (XALATAN) 0.005 % ophthalmic solution Place 1 drop into both eyes at bedtime.   12  . meclizine (ANTIVERT) 25 MG tablet Take 1 tablet (25 mg total) 3 (three) times daily as needed by mouth for dizziness. 270 tablet 3  . metoprolol succinate (TOPROL-XL) 50 MG 24 hr tablet Take 25 mg by mouth daily. Take with or immediately  following a meal.    . omeprazole (PRILOSEC) 40 MG capsule Take 40 mg by mouth at bedtime.     [name] rOPINIRole (REQUIP) 1 MG tablet Take 1 tablet (1 mg total) by mouth daily. 90 tablet 3  . sertraline (ZOLOFT) 100 MG tablet TAKE 1 TABLET DAILY 90 tablet 4  . simvastatin (ZOCOR) 20 MG tablet Take 1 tablet (20 mg total) by mouth every evening. NEEDS OFFICE VISIT FOR MORE REFILLS 90 tablet 1  . XARELTO 20 MG TABS tablet TAKE 1 TABLET DAILY WITH SUPPER 90 tablet 1  . spironolactone (ALDACTONE) 25 MG tablet Take 0.5 tablets (12.5 mg total) by mouth daily. (Patient not taking: Reported on 05/29/2018) 15 tablet 6  . glipiZIDE (GLUCOTROL XL) 10 MG 24 hr tablet 1 tab po qAM (Patient not taking: Reported on 05/29/2018) 90 tablet 1   No facility-administered medications prior to visit.     No Known Allergies  ROS As per HPI  PE: Blood pressure 114/72, pulse 83, temperature (!) 97.5 F (36.4 C), temperature source Oral, resp. rate 16, height 5\' 11"  (1.803 m), weight 224 lb 4 oz (101.7 kg), SpO2 98 %. Gen: Alert, well appearing.  Patient is oriented to person, place, time, and situation. AFFECT: pleasant, lucid thought and speech. No further exam today.  LABS:    Chemistry      Component Value Date/Time   NA 136 04/04/2018 1023   NA 139 12/28/2016 1405   K 4.3 04/04/2018 1023   CL 104 04/04/2018 1023   CO2 25 04/04/2018 1023   BUN 17 04/04/2018 1023   BUN 25 12/28/2016 1405   CREATININE 1.21 04/04/2018 1023   CREATININE 1.21 (H) 06/06/2016 1312      Component Value Date/Time   CALCIUM 8.8 (L) 04/04/2018 1023   ALKPHOS 78 07/27/2017 1158   AST 18 07/27/2017 1158   ALT 14 07/27/2017 1158   BILITOT 0.5 07/27/2017 1158     Lab Results  Component Value Date   HGBA1C 7.8 (A) 05/14/2018   IMPRESSION AND PLAN:  DM, control not optimal. Recently started lantus and jardiance. Instructions: Be sure you are taking ONE of your glipizide tabs every morning. Continue to take jardiance every  day. Increase your lantus to 12 Units at bedtime. Check fasting sugar every morning.  If the average sugar level over 3 days is not in 100-110 range, then increase lantus by 1 unit.  Continue to increase by 1 unit every 3 days until fasting sugar is consistently in 100-110 range.  Also, make sure you are taking your spironolactone that your cardiologist prescribed you: (1/2 tab every day).  This is a medication to help your heart pump more efficiently.   An After Visit Summary was printed and given to the patient.  FOLLOW UP: Return in about 3 months (around 08/29/2018) for routine chronic illness f/u.  Signed:  Crissie Sickles, MD           05/29/2018

## 2018-05-29 NOTE — Patient Instructions (Addendum)
Be sure you are taking ONE of your glipizide tabs every morning. Continue to take jardiance every day.  Increase your lantus to 12 Units at bedtime. Check fasting sugar every morning.  If the average sugar level over 3 days is not in 100-110 range, then increase lantus by 1 unit. Continue to increase by 1 unit every 3 days until fasting sugar is consistently in 100-110 range.  Also, make sure you are taking your spironolactone that your cardiologist prescribed you: (1/2 tab every day).  This is a medication to help your heart pump more efficiently.

## 2018-05-31 ENCOUNTER — Ambulatory Visit: Payer: Medicare Other | Admitting: Cardiovascular Disease

## 2018-06-14 ENCOUNTER — Telehealth (HOSPITAL_COMMUNITY): Payer: Self-pay | Admitting: Vascular Surgery

## 2018-06-14 ENCOUNTER — Other Ambulatory Visit: Payer: Self-pay

## 2018-06-14 MED ORDER — EMPAGLIFLOZIN 10 MG PO TABS: 10.0000 mg | ORAL_TABLET | Freq: Every day | ORAL | 0 refills | Status: DC

## 2018-06-14 NOTE — Telephone Encounter (Signed)
Left pt message to reschedule appt w/ db 08/16/18 , DB will be out of the office , pt can be scheduled for next ava

## 2018-06-17 ENCOUNTER — Other Ambulatory Visit: Payer: Self-pay | Admitting: Family Medicine

## 2018-06-17 MED ORDER — INSULIN GLARGINE 100 UNIT/ML SOLOSTAR PEN: PEN_INJECTOR | SUBCUTANEOUS | 1 refills | Status: DC

## 2018-06-17 NOTE — Telephone Encounter (Signed)
Do you manage this medication? Or does pts cardiologist manage it? The dose requested does not match what is in pts chart.   Please advise. Thanks.

## 2018-06-17 NOTE — Telephone Encounter (Signed)
Last cardiology note states to continue 50 mg toprol xl. I defer RF of this med to his cardiologist since they have made adjustments to it in the past.

## 2018-06-26 ENCOUNTER — Encounter: Payer: Self-pay | Admitting: Family Medicine

## 2018-06-26 ENCOUNTER — Ambulatory Visit (INDEPENDENT_AMBULATORY_CARE_PROVIDER_SITE_OTHER): Payer: Medicare Other | Admitting: Family Medicine

## 2018-06-26 ENCOUNTER — Other Ambulatory Visit: Payer: Self-pay | Admitting: *Deleted

## 2018-06-26 VITALS — BP 105/70 | HR 77 | Temp 98.2°F | Resp 16 | Ht 71.0 in | Wt 231.5 lb

## 2018-06-26 DIAGNOSIS — N3941 Urge incontinence: Secondary | ICD-10-CM

## 2018-06-26 DIAGNOSIS — S43401A Unspecified sprain of right shoulder joint, initial encounter: Secondary | ICD-10-CM | POA: Diagnosis not present

## 2018-06-26 DIAGNOSIS — S20222A Contusion of left back wall of thorax, initial encounter: Secondary | ICD-10-CM | POA: Diagnosis not present

## 2018-06-26 MED ORDER — OXYBUTYNIN CHLORIDE ER 5 MG PO TB24: 5.0000 mg | ORAL_TABLET | Freq: Every day | ORAL | 3 refills | Status: DC

## 2018-06-26 MED ORDER — INSULIN GLARGINE 100 UNIT/ML SOLOSTAR PEN: PEN_INJECTOR | SUBCUTANEOUS | 4 refills | Status: DC

## 2018-06-26 NOTE — Progress Notes (Signed)
OFFICE VISIT  06/26/2018   CC:  Chief Complaint  Patient presents with  . Shoulder Pain    Right   HPI:    Patient is a 77 y.o. Caucasian male with hx of LEFT reverse total shoulder arthroplasty who presents for RIGHT shoulder pain. He has no hx of significant cervical spine disorder.  Lifted a piece of plywood yesterday and felt R shoulder pain on top and anteriorly.  Initially the pain was constant and worse with aBduction/ER.  The pain does not radiate.   Today the pain is much improved but he wanted to keep appt just in case.  No arm weakness.  No neck pain. Arm/shoulder without pain at rest, just with ROM.  Also about 1 mo ago he slipped on a slope while walking, slid back and hit L CVA/back area on a 4X 4. The area still hurts some, mainly with taking deep breaths.  No SOB.  He also reports 1 yr of urinary frequency (daytime and nighttime), urgency to the point of leaking urine often--sometimes so much that he has to change underwear or pants.  No dysuria, no urinary hesitancy or incomplete emptying.  Stream is strong.  No hematuria.  No flank pain.  ROS: no fevers, no CP, no abd pain, no n/v, no focal weakness, no dizziness.  Past Medical History:  Diagnosis Date  . AICD (automatic cardioverter/defibrillator) present 2018   MDT CRT-D.  Fatigue-->completely pacer dependent.  Pacer settings adjusted 12/2017 to allow more chronotrophc variance with ADL's//exertion.  . Arthritis    Pt is s/p eft reverse total shoulder arthroplasty.  [name] BPH (benign prostatic hypertrophy) 06/2011   Irritative sx's; pt declined trial of anticholinergic per Urology records  . Chronic combined systolic and diastolic heart failure ([place]) 05/31/2012   Nonischemic:  EF 40-45%, LA mod-severe dilated, AFIB.   02/2016 EF 40%, diffuse hypokinesis, grade 2 DD.  Myoc perf imaging showed EF 32% 04/2016.  Pt upgraded to CRT-D 01/04/17.  [name] Chronic renal insufficiency, stage III (moderate) (HCC) 2015   CrCl about 60  ml/min  . Complete heart block (HCC)    Has dual chamber pacer.  . Depression   . DOE (dyspnea on exertion)    NYHA class II/III CHF  . Episodic low back pain 01/22/2013   w/intermittent radiculitis (12/2014 his neurologist referred him to pain mgmt for epidural steroid injection)  . Erectile dysfunction 2019   due to zoloft--urol rx'd viagra  . GERD (gastroesophageal reflux disease)   . H/O tilt table evaluation 11/02/05   negative  . Helicobacter pylori gastritis 01/2016  . High cholesterol   . History of adenomatous polyp of colon 10/12/11   Dr. [name] (3 right side of colon- tubular adenomas removed)  . History of cardiovascular stress test 05/28/12   no ischemia, EF 37%, imaging results are unchanged and within normal variance  . History of chronic prostatitis   . History of kidney stones   . History of vertigo    + Hx of posterior HA's.  Neuro (Dr. Erling Cruz) eval 2011.  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  . Hypertension   . Lumbar spondylosis    lumbosacral radiculopathy at L4 by EMG testing, right foot drop (neurologist is Dr. Linus Salmons with [facility] in [place])--neurologist referred him to neurosurgery  . Migraine    "used to have them all the time; none for years" (01/04/2017)  . Myocardial infarction [facility]) ?1970s   not  entirely certain of this  . Nephrolithiasis 07/2012   Left UVJ 2 mm stone with dilation of renal collecting system and slight hydroureter on right  . Neuropathy   . NICM (nonischemic cardiomyopathy) ([place])    a. 02/2018 Cath: LM nl, LAD min irregs, LCX no, RCA 20d. [id number]. Fick CO/CI 4.4/2.0.  . 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  Since no CAD on cath 05/2016, cards recommends upgrade to CRT-D.  [name] Permanent atrial fibrillation    DCCV 07/09/13-converted, lasted two days, then back into afib--needs lifetime  anticoagulation (Xarelto as of 09/2014)  . Prostate cancer screening 09/2017   done by urol annually (normal prostate exam documented + PSA 0.84 as of 10/01/17 urol f/u.  [name] Rectus diastasis   . Right ankle sprain 08/2017   w/distal fibula avulsion fx noted on u/s but not plain film-(Dr. Hudnall).  . Skin cancer of arm, left    "burned it off" (01/04/2017)  . TIA (transient ischemic attack)    a. 03/22/2018 following cath. CT head neg.  . Type II diabetes mellitus ([place])     Past Surgical History:  Procedure Laterality Date  . ABI's Bilateral 05/21/2018   normal  . BACK SURGERY    . BI-VENTRICULAR IMPLANTABLE CARDIOVERTER DEFIBRILLATOR  (CRT-D) Left 01/04/2017  . BIV ICD INSERTION CRT-D N/A 01/04/2017   Procedure: BiV ICD ;  Surgeon: Constance Haw, MD;  Location: [place] [facility];  Service: Cardiovascular;  Laterality: N/A;  . CARDIAC CATHETERIZATION N/A 06/14/2016   Minimal nonobstructive dz, EF 25-35%.  Procedure: Left Heart Cath and Coronary Angiography;  Surgeon: Peter M Martinique, MD;  Location: [place] [facility];  Service: Cardiovascular;  Laterality: N/A;  . CARDIOVASCULAR STRESS TEST  2012   2012 nuclear perfusion study: low risk scan; 04/2016 normal myocardial perfusion imaging, EF 32%.  [name] CARDIOVERSION  07/09/2012   Procedure: CARDIOVERSION;  Surgeon: [name], MD;  Location: [place];  Service: Cardiovascular;  Laterality: N/A;  . CATARACT EXTRACTION W/ INTRAOCULAR LENS IMPLANT & ANTERIOR VITRECTOMY, BILATERAL Bilateral   . COLONOSCOPY W/ POLYPECTOMY  approx 2006; repeated 09/2011   Polyps on 2013 EGD as well, repeat 12/2014  . ESOPHAGOGASTRODUODENOSCOPY  10/18/06   Done due to chronic GERD: Normal, bx showed no barrett's esophagus (Dr. [name])  . FLEXOR TENDON REPAIR Left 10/02/2016   Procedure: LEFT RING FINGER WOUND EXPORATION AND FLEXOR TENDON REPAIR AND NERVE REPAIR;  Surgeon: Milly Jakob, MD;  Location: [place];  Service: Orthopedics;  Laterality: Left;  .  INSERT / REPLACE / REMOVE PACEMAKER  02/05/2012   dual chamber, sinus node dysfunction, sinus arrest, PAF, Medtronic Revo serial#-[id number] H: last checked 05/2015  . LUMBAR LAMINECTOMY Left 1976   L4-5  . PACEMAKER REMOVAL  01/04/2017  . PERMANENT PACEMAKER INSERTION N/A 02/05/2012   Procedure: PERMANENT PACEMAKER INSERTION;  Surgeon: [name], MD; Generator Medtronic Revo model [place] serial number [id number] H Laterality: N/A;  . RETINAL DETACHMENT SURGERY Left ~ 1999  . REVERSE SHOULDER ARTHROPLASTY Left 2018   Left shoulder reverse [name] [place]  [name] RIGHT/LEFT HEART CATH AND CORONARY ANGIOGRAPHY N/A 03/22/2018   EF 30-35%, no CAD.  Procedure: RIGHT/LEFT HEART CATH AND CORONARY ANGIOGRAPHY;  Surgeon: Jolaine Artist, MD;  Location: [place] [facility];  Service: Cardiovascular;  Laterality: N/A;  . TRANSTHORACIC ECHOCARDIOGRAM  08/25/10; 05/2012; 03/23/16;12/2017   mild asymmetric LVH, normal systolic function, normal diastolic fxn, mild-to-mod mitral regurg, mild aortic valve  sclerosis and trace AI, mild aortic root dilatation. 2014 f/u showed EF 40-45%, mod LAE, A FIB.  02/2016 EF 40%, diffuse hypokinesis, grade 2 DD. 12/2017 EF 35-40%,diffuse hypokin,grd III DD, mild MR    Outpatient Medications Prior to Visit  Medication Sig Dispense Refill  . acetaminophen (TYLENOL) 500 MG tablet Take 1,000 mg by mouth every 6 (six) hours as needed for moderate pain or headache.    . colchicine 0.6 MG tablet Take 0.6 mg by mouth daily as needed (for gout).     . COMBIGAN 0.2-0.5 % ophthalmic solution Place 1 drop into both eyes at bedtime.     . empagliflozin (JARDIANCE) 10 MG TABS tablet Take 10 mg by mouth daily. 90 tablet 0  . ENTRESTO 97-103 MG TAKE 1 TABLET TWICE A DAY 180 tablet 3  . gabapentin (NEURONTIN) 300 MG capsule Take 1 capsule (300 mg total) by mouth 3 (three) times daily. 270 capsule 3  . glipiZIDE (GLUCOTROL XL) 10 MG 24 hr tablet 1 tab po qAM 90 tablet 1  .  glucose blood test strip Use to check blood sugars 1-2 times daily 100 each 3  . Insulin Glargine (LANTUS SOLOSTAR) 100 UNIT/ML Solostar Pen 12 units SQ qhs (Patient taking differently: 14 units SQ qhs) 15 pen 4  . Insulin Pen Needle (PEN NEEDLES) 31G X 5 MM MISC 1 each by Does not apply route daily. 100 each 5  . Lancets (FREESTYLE) lancets Use as instructed 100 each 6  . latanoprost (XALATAN) 0.005 % ophthalmic solution Place 1 drop into both eyes at bedtime.   12  . meclizine (ANTIVERT) 25 MG tablet Take 1 tablet (25 mg total) 3 (three) times daily as needed by mouth for dizziness. 270 tablet 3  . metoprolol succinate (TOPROL-XL) 50 MG 24 hr tablet Take 25 mg by mouth daily. Take with or immediately following a meal.    . omeprazole (PRILOSEC) 40 MG capsule Take 40 mg by mouth at bedtime.     [name] rOPINIRole (REQUIP) 1 MG tablet Take 1 tablet (1 mg total) by mouth daily. 90 tablet 3  . sertraline (ZOLOFT) 100 MG tablet TAKE 1 TABLET DAILY 90 tablet 4  . simvastatin (ZOCOR) 20 MG tablet Take 1 tablet (20 mg total) by mouth every evening. NEEDS OFFICE VISIT FOR MORE REFILLS 90 tablet 1  . XARELTO 20 MG TABS tablet TAKE 1 TABLET DAILY WITH SUPPER 90 tablet 1  . spironolactone (ALDACTONE) 25 MG tablet Take 0.5 tablets (12.5 mg total) by mouth daily. (Patient not taking: Reported on 05/29/2018) 15 tablet 6   No facility-administered medications prior to visit.     No Known Allergies  ROS As per HPI  PE: Blood pressure 105/70, pulse 77, temperature 98.2 F (36.8 C), temperature source Oral, resp. rate 16, height 5\' 11"  (1.803 m), weight 231 lb 8 oz (105 kg), SpO2 95 %. Gen: Alert, well appearing.  Patient is oriented to person, place, time, and situation. AFFECT: pleasant, lucid thought and speech. BACK: minimal TTP over lower posterior rib cage, without crepitus.  No bruising. R shoulder without warmth or erythema.  Mild TTP over distal aspect of biceps tendon, mildly positive speeds, neg  yergason's. Mild pain with IR and a bit more with aBduction (when arm aBducted to 100 deg.  Full ROM intact, though. Neg drop sign.  LABS:    Chemistry      Component Value Date/Time   NA 136 04/04/2018 1023   NA 139 12/28/2016 1405  K 4.3 04/04/2018 1023   CL 104 04/04/2018 1023   CO2 25 04/04/2018 1023   BUN 17 04/04/2018 1023   BUN 25 12/28/2016 1405   CREATININE 1.21 04/04/2018 1023   CREATININE 1.21 (H) 06/06/2016 1312      Component Value Date/Time   CALCIUM 8.8 (L) 04/04/2018 1023   ALKPHOS 78 07/27/2017 1158   AST 18 07/27/2017 1158   ALT 14 07/27/2017 1158   BILITOT 0.5 07/27/2017 1158      IMPRESSION AND PLAN:  1) R shoulder nonspecific sprain, mild.  No sign of any tears. Discussed icing, gentle ROM w/out resistance, relative rest. No NSAIDs since he is on xarelto.  2) L posterior rib cage contusion-->gradually resolving.  Ice prn. No NSAIDs since he is on xarelto.  3) Urge incontinence/OAB: trial of oxybutynin ER 5mg  qhs.  Therapeutic expectations and side effect profile of medication discussed today.  Patient's questions answered.  An After Visit Summary was printed and given to the patient.  FOLLOW UP: Return if symptoms worsen or fail to improve.  Signed:  Crissie Sickles, MD           06/26/2018

## 2018-06-26 NOTE — Patient Instructions (Signed)
Shoulder Sprain A shoulder sprain is a partial or complete tear in one of the tough, fiber-like tissues (ligaments) in the shoulder. The ligaments in the shoulder help to hold the shoulder in place. What are the causes? This condition may be caused by:  A fall.  A hit to the shoulder.  A twist of the arm.  What increases the risk? This condition is more likely to develop in:  People who play sports.  People who have problems with balance or coordination.  What are the signs or symptoms? Symptoms of this condition include:  Pain when moving the shoulder.  Limited ability to move the shoulder.  Swelling and tenderness on top of the shoulder.  Warmth in the shoulder.  A change in the shape of the shoulder.  Redness or bruising on the shoulder.  How is this diagnosed? This condition is diagnosed with a physical exam. During the exam, you may be asked to do simple exercises with your shoulder. You may also have imaging tests, such as X-rays, MRI, or a CT scan. These tests can show how severe the sprain is. How is this treated? This condition may be treated with:  Rest.  Pain medicine.  Ice.  A sling or brace. This is used to keep the arm still while the shoulder is healing.  Physical therapy or rehabilitation exercises. These help to improve the range of motion and strength of the shoulder.  Surgery (rare). Surgery may be needed if the sprain caused a joint to become unstable. Surgery may also be needed to reduce pain.  Some people may develop ongoing shoulder pain or lose some range of motion in the shoulder. However, most people do not develop long-term problems. Follow these instructions at home:  Rest.  Ask your health care provider when it is safe for you to drive if you have a sling or brace on your shoulder.  Take over-the-counter and prescription medicines only as told by your health care provider.  If directed, apply ice to the area: ? Put ice in a  plastic bag. ? Place a towel between your skin and the bag. ? Leave the ice on for 20 minutes, 2-3 times per day.  If you were given a shoulder sling or brace: ? Wear it as told. ? Remove it to shower or bathe. ? Move your arm only as much as told by your health care provider, but keep your hand moving to prevent swelling.  If you were shown how to do any exercises, do them as told by your health care provider.  Keep all follow-up visits as told by your health care provider. This is important. Contact a health care provider if:  Your pain gets worse.  Your pain is not relieved with medicines.  You have increased redness or swelling. Get help right away if:  You have a fever.  You cannot move your arm or shoulder.  You develop severe numbness or tingling in your arm, hand, or fingers.  Your arm, hand, or fingers turn blue, white, or gray and feel cold. This information is not intended to replace advice given to you by your health care provider. Make sure you discuss any questions you have with your health care provider. Document Released: 11/26/2008 Document Revised: 03/05/2016 Document Reviewed: 11/02/2014 Elsevier Interactive Patient Education  2018 Elsevier Inc.  

## 2018-06-27 ENCOUNTER — Other Ambulatory Visit (HOSPITAL_COMMUNITY): Payer: Self-pay

## 2018-06-27 MED ORDER — SPIRONOLACTONE 25 MG PO TABS: 12.5000 mg | ORAL_TABLET | Freq: Every day | ORAL | 1 refills | Status: DC

## 2018-06-28 ENCOUNTER — Other Ambulatory Visit: Payer: Self-pay | Admitting: Family Medicine

## 2018-06-28 MED ORDER — SIMVASTATIN 20 MG PO TABS: 20.0000 mg | ORAL_TABLET | Freq: Every evening | ORAL | 1 refills | Status: DC

## 2018-06-28 NOTE — Addendum Note (Signed)
Addended by: Onalee Hua on: 06/28/2018 11:37 AM   Modules accepted: Orders

## 2018-06-30 ENCOUNTER — Other Ambulatory Visit: Payer: Self-pay | Admitting: Family Medicine

## 2018-07-01 NOTE — Telephone Encounter (Signed)
RF request for ropinirole LOV: 05/29/18 Next ov: 08/29/18 Last written: 04/02/17 #90 w/ 3RF  Please advise. Thanks.

## 2018-07-05 ENCOUNTER — Other Ambulatory Visit: Payer: Self-pay | Admitting: Family Medicine

## 2018-07-09 DIAGNOSIS — L820 Inflamed seborrheic keratosis: Secondary | ICD-10-CM | POA: Diagnosis not present

## 2018-07-09 DIAGNOSIS — D225 Melanocytic nevi of trunk: Secondary | ICD-10-CM | POA: Diagnosis not present

## 2018-07-09 DIAGNOSIS — D1801 Hemangioma of skin and subcutaneous tissue: Secondary | ICD-10-CM | POA: Diagnosis not present

## 2018-07-09 DIAGNOSIS — L821 Other seborrheic keratosis: Secondary | ICD-10-CM | POA: Diagnosis not present

## 2018-07-09 DIAGNOSIS — L570 Actinic keratosis: Secondary | ICD-10-CM | POA: Diagnosis not present

## 2018-07-15 ENCOUNTER — Telehealth: Payer: Self-pay | Admitting: Family Medicine

## 2018-07-15 MED ORDER — METOPROLOL SUCCINATE ER 50 MG PO TB24: 50.0000 mg | ORAL_TABLET | Freq: Every day | ORAL | 11 refills | Status: DC

## 2018-07-15 NOTE — Telephone Encounter (Signed)
Copied from [place] [phone]. Topic: Quick Communication - See Telephone Encounter >> Jul 15, 2018  9:54 AM Burchel, Abbi R wrote: CRM for notification. See Telephone encounter for: 07/15/18.  Pt is requesting a call back from Dr Toy Baker asst re: (metoprolol succinate (TOPROL-XL) 50 MG 24 hr tablet).  Pt is insisting that it should be refilled by Dr Anitra Lauth because it was originally prescribed by him. He is quite upset bc he has been out of this medication for a week and would like to speak directly with Dr Anitra Lauth or his assistant.  Please call pt to advise.   [phone]

## 2018-07-15 NOTE — Telephone Encounter (Signed)
Pt advised and voiced understanding.   

## 2018-07-15 NOTE — Telephone Encounter (Signed)
Please advise. Thanks.  

## 2018-07-15 NOTE — Telephone Encounter (Signed)
Left message for pt to call back  °

## 2018-07-15 NOTE — Telephone Encounter (Signed)
Metoprolol eRx'd to his local pharmacy.

## 2018-07-22 DIAGNOSIS — M24542 Contracture, left hand: Secondary | ICD-10-CM | POA: Diagnosis not present

## 2018-07-22 DIAGNOSIS — R200 Anesthesia of skin: Secondary | ICD-10-CM | POA: Diagnosis not present

## 2018-07-22 DIAGNOSIS — L905 Scar conditions and fibrosis of skin: Secondary | ICD-10-CM | POA: Diagnosis not present

## 2018-07-22 DIAGNOSIS — R202 Paresthesia of skin: Secondary | ICD-10-CM | POA: Diagnosis not present

## 2018-07-22 DIAGNOSIS — M24541 Contracture, right hand: Secondary | ICD-10-CM | POA: Diagnosis not present

## 2018-07-23 ENCOUNTER — Ambulatory Visit (INDEPENDENT_AMBULATORY_CARE_PROVIDER_SITE_OTHER): Payer: Medicare Other

## 2018-07-23 ENCOUNTER — Encounter: Payer: Self-pay | Admitting: Cardiovascular Disease

## 2018-07-23 ENCOUNTER — Ambulatory Visit (INDEPENDENT_AMBULATORY_CARE_PROVIDER_SITE_OTHER): Payer: Medicare Other | Admitting: Cardiovascular Disease

## 2018-07-23 VITALS — BP 108/76 | HR 79 | Ht 71.0 in | Wt 231.0 lb

## 2018-07-23 DIAGNOSIS — T50905A Adverse effect of unspecified drugs, medicaments and biological substances, initial encounter: Secondary | ICD-10-CM

## 2018-07-23 DIAGNOSIS — I442 Atrioventricular block, complete: Secondary | ICD-10-CM | POA: Diagnosis not present

## 2018-07-23 DIAGNOSIS — I5022 Chronic systolic (congestive) heart failure: Secondary | ICD-10-CM | POA: Diagnosis not present

## 2018-07-23 DIAGNOSIS — Z950 Presence of cardiac pacemaker: Secondary | ICD-10-CM | POA: Diagnosis not present

## 2018-07-23 DIAGNOSIS — I495 Sick sinus syndrome: Secondary | ICD-10-CM

## 2018-07-23 DIAGNOSIS — N62 Hypertrophy of breast: Secondary | ICD-10-CM

## 2018-07-23 DIAGNOSIS — E1169 Type 2 diabetes mellitus with other specified complication: Secondary | ICD-10-CM | POA: Diagnosis not present

## 2018-07-23 DIAGNOSIS — I10 Essential (primary) hypertension: Secondary | ICD-10-CM

## 2018-07-23 DIAGNOSIS — E669 Obesity, unspecified: Secondary | ICD-10-CM

## 2018-07-23 DIAGNOSIS — Z95810 Presence of automatic (implantable) cardiac defibrillator: Secondary | ICD-10-CM | POA: Diagnosis not present

## 2018-07-23 DIAGNOSIS — M79605 Pain in left leg: Secondary | ICD-10-CM | POA: Diagnosis not present

## 2018-07-23 DIAGNOSIS — I4821 Permanent atrial fibrillation: Secondary | ICD-10-CM | POA: Diagnosis not present

## 2018-07-23 DIAGNOSIS — E119 Type 2 diabetes mellitus without complications: Secondary | ICD-10-CM

## 2018-07-23 DIAGNOSIS — M79604 Pain in right leg: Secondary | ICD-10-CM | POA: Diagnosis not present

## 2018-07-23 LAB — CUP PACEART REMOTE DEVICE CHECK
Battery Remaining Longevity: 77 mo
Battery Voltage: 2.98 V
Brady Statistic AP VP Percent: 0 %
Brady Statistic AP VS Percent: 0 %
Brady Statistic AS VP Percent: 0 %
Brady Statistic AS VS Percent: 0 %
Brady Statistic RA Percent Paced: 0 %
Brady Statistic RV Percent Paced: 99.95 %
Date Time Interrogation Session: 20191231120321
HighPow Impedance: 80 Ohm
Implantable Lead Implant Date: 20130715
Implantable Lead Implant Date: 20180614
Implantable Lead Implant Date: 20180614
Implantable Lead Location: 753858
Implantable Lead Location: 753859
Implantable Lead Location: 753860
Implantable Lead Model: 4598
Implantable Pulse Generator Implant Date: 20180614
Lead Channel Impedance Value: 193.707
Lead Channel Impedance Value: 201.488
Lead Channel Impedance Value: 201.488
Lead Channel Impedance Value: 218.087
Lead Channel Impedance Value: 218.087
Lead Channel Impedance Value: 361 Ohm
Lead Channel Impedance Value: 399 Ohm
Lead Channel Impedance Value: 418 Ohm
Lead Channel Impedance Value: 456 Ohm
Lead Channel Impedance Value: 456 Ohm
Lead Channel Impedance Value: 513 Ohm
Lead Channel Impedance Value: 513 Ohm
Lead Channel Impedance Value: 589 Ohm
Lead Channel Impedance Value: 703 Ohm
Lead Channel Impedance Value: 703 Ohm
Lead Channel Impedance Value: 722 Ohm
Lead Channel Impedance Value: 779 Ohm
Lead Channel Impedance Value: 779 Ohm
Lead Channel Pacing Threshold Amplitude: 0.5 V
Lead Channel Pacing Threshold Amplitude: 0.625 V
Lead Channel Pacing Threshold Pulse Width: 0.4 ms
Lead Channel Pacing Threshold Pulse Width: 1 ms
Lead Channel Sensing Intrinsic Amplitude: 0.625 mV
Lead Channel Sensing Intrinsic Amplitude: 20.5 mV
Lead Channel Sensing Intrinsic Amplitude: 20.5 mV
Lead Channel Setting Pacing Amplitude: 1 V
Lead Channel Setting Pacing Amplitude: 2 V
Lead Channel Setting Pacing Pulse Width: 0.4 ms
Lead Channel Setting Pacing Pulse Width: 1 ms
Lead Channel Setting Sensing Sensitivity: 0.3 mV

## 2018-07-23 MED ORDER — EPLERENONE 25 MG PO TABS: 25.0000 mg | ORAL_TABLET | Freq: Every day | ORAL | 3 refills | Status: DC

## 2018-07-23 NOTE — Patient Instructions (Signed)
Medication Instructions:  Dr Sallyanne Kuster has recommended making the following medication changes: 1. STOP Spironolactone 2. START Eplerenone 25 mg - take 1 tablet daily  If you need a refill on your cardiac medications before your next appointment, please call your pharmacy.   Follow-Up: At [facility], you and your health needs are our priority.  As part of our continuing mission to provide you with exceptional heart care, we have created designated Provider Care Teams.  These Care Teams include your primary Cardiologist (physician) and Advanced Practice Providers (APPs -  Physician Assistants and Nurse Practitioners) who all work together to provide you with the care you need, when you need it. You will need a follow up appointment in 6 months.  Please call our office 2 months in advance to schedule this appointment.  You may see [name], MD or one of the following Advanced Practice Providers on your designated Care Team: [place], [place] . Fabian Sharp, PA-C . You will receive a reminder letter in the mail two months in advance. If you don't receive a letter, please call our office to schedule the follow-up appointment.

## 2018-07-24 DIAGNOSIS — Z95810 Presence of automatic (implantable) cardiac defibrillator: Secondary | ICD-10-CM | POA: Insufficient documentation

## 2018-07-24 NOTE — Progress Notes (Signed)
`   Cardiology Office Note    Date:  07/24/2018   ID:  [name], DOB 1941/06/09, MRN [id number]  PCP:  Tammi Sou, MD  Cardiologist:   [name], MD   Chief Complaint  Patient presents with  . Shortness of Breath    some with activity    History of Present Illness:  Mason [name] is a 78 y.o. male with nonischemic cardiomyopathy, permanent atrial fibrillation and complete heart block who underwent implantation of a CRT-D in June 2018 for deteriorating left ventricular systolic function.  He is pacemaker dependent.  [name] continues to complain of fatigue, but denies dyspnea at rest or with activity.  He does not have angina.  He has not experienced syncope or palpitations.  Difficult to see whether he experienced any benefit from the last change in his device sensor settings.  Interrogation of his pacemaker shows adequate heart rate histogram distribution.  It is hard to support a diagnosis of chronotropic incompetence after the last sensor rate control settings were adjusted.  Activity is constant at about 2 hours/day.  Thoracic impedance is in normal range without evidence of volume overload.  He has 100% biventricular pacing.  Lead parameters are excellent.  Estimated generator longevity 6.4 years.  There has been no evidence of ventricular tachycardia.  The patient specifically denies any chest pain at rest exertion, dyspnea at rest or with exertion, orthopnea, paroxysmal nocturnal dyspnea, syncope, palpitations, focal neurological deficits, intermittent claudication, lower extremity edema, unexplained weight gain, cough, hemoptysis or wheezing.  He complains of worsening gynecomastia.  He does not have coronary or other vascular disorders, but has numerous risk factors including type 2 diabetes, hyperlipidemia, mild obesity. He has a history of gout.  The cause of Mason [name]'s cardiomyopathy remains uncertain.  He initially presented with a "heart attack" and was in the ICU in  the [place] hospital at [facility] for 3 to 4 days in 1976.  He has had a depressed left ventricular systolic function for many years.  Cardiac catheterization most recently performed in 2019 does show nonobstructive atherosclerosis.[name]  He has a Water engineer including 2 stints in [place] and he knows that he was exposed to [employer].  He underwent right and left heart catheterization for optimization of heart failure therapy on March 22, 2018.  Right atrial pressure was 7 mmHg, wedge pressure was 13 and PA pressure was 30/10 (mean 22) millimeters Hg.  The cardiac index was 2.0.  The coronary arteries had only minimal atherosclerosis.  Radial artery access was difficult.  On the catheterization he developed transient weakness of the left upper extremity and left facial droop, from which he recovered fully thankfully.  His dose of beta-blocker was reduced.  He is not on diuretics.  He subsequently underwent cardiopulmonary stress testing on September 18 and did quite well with a mildly reduced peak VO2 of 16.8 mL/kg/min (79% of control).  There was still evidence of drug-induced chronotropic incompetence with a maximum heart rate only reached 74% of predicted.     Past Medical History:  Diagnosis Date  . AICD (automatic cardioverter/defibrillator) present 2018   MDT CRT-D.  Fatigue-->completely pacer dependent.  Pacer settings adjusted 12/2017 to allow more chronotrophc variance with ADL's//exertion.  . Arthritis    Pt is s/p eft reverse total shoulder arthroplasty.  [name] BPH (benign prostatic hypertrophy) 06/2011   Irritative sx's; pt declined trial of anticholinergic per Urology records  . Chronic combined systolic and diastolic heart failure ([place])  05/31/2012   Nonischemic:  EF 40-45%, LA mod-severe dilated, AFIB.   02/2016 EF 40%, diffuse hypokinesis, grade 2 DD.  Myoc perf imaging showed EF 32% 04/2016.  Pt upgraded to CRT-D 01/04/17.  [name] Chronic renal insufficiency, stage III (moderate)  (HCC) 2015   CrCl about 60 ml/min  . Complete heart block (HCC)    Has dual chamber pacer.  . Depression   . DOE (dyspnea on exertion)    NYHA class II/III CHF  . Episodic low back pain 01/22/2013   w/intermittent radiculitis (12/2014 his neurologist referred him to pain mgmt for epidural steroid injection)  . Erectile dysfunction 2019   due to zoloft--urol rx'd viagra  . GERD (gastroesophageal reflux disease)   . H/O tilt table evaluation 11/02/05   negative  . Helicobacter pylori gastritis 01/2016  . High cholesterol   . History of adenomatous polyp of colon 10/12/11   Dr. [name] (3 right side of colon- tubular adenomas removed)  . History of cardiovascular stress test 05/28/12   no ischemia, EF 37%, imaging results are unchanged and within normal variance  . History of chronic prostatitis   . History of kidney stones   . History of vertigo    + Hx of posterior HA's.  Neuro (Dr. Erling Cruz) eval 2011.  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  . Hypertension   . Lumbar spondylosis    lumbosacral radiculopathy at L4 by EMG testing, right foot drop (neurologist is Dr. Linus Salmons with [facility] in [place])--neurologist referred him to neurosurgery  . Migraine    "used to have them all the time; none for years" (01/04/2017)  . Myocardial infarction [facility]) ?1970s   not entirely certain of this  . Nephrolithiasis 07/2012   Left UVJ 2 mm stone with dilation of renal collecting system and slight hydroureter on right  . Neuropathy   . NICM (nonischemic cardiomyopathy) ([place])    a. 02/2018 Cath: LM nl, LAD min irregs, LCX no, RCA 20d. [id number]. Fick CO/CI 4.4/2.0.  . 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  Since no CAD on cath 05/2016, cards recommends upgrade to CRT-D.  [name] Permanent atrial fibrillation    DCCV 07/09/13-converted, lasted two days, then back into  afib--needs lifetime anticoagulation (Xarelto as of 09/2014)  . Prostate cancer screening 09/2017   done by urol annually (normal prostate exam documented + PSA 0.84 as of 10/01/17 urol f/u.  [name] Rectus diastasis   . Right ankle sprain 08/2017   w/distal fibula avulsion fx noted on u/s but not plain film-(Dr. Hudnall).  . Skin cancer of arm, left    "burned it off" (01/04/2017)  . TIA (transient ischemic attack)    a. 03/22/2018 following cath. CT head neg.  . Type II diabetes mellitus ([place])     Past Surgical History:  Procedure Laterality Date  . ABI's Bilateral 05/21/2018   normal  . BACK SURGERY    . BI-VENTRICULAR IMPLANTABLE CARDIOVERTER DEFIBRILLATOR  (CRT-D) Left 01/04/2017  . BIV ICD INSERTION CRT-D N/A 01/04/2017   Procedure: BiV ICD ;  Surgeon: Constance Haw, MD;  Location: [place] [facility];  Service: Cardiovascular;  Laterality: N/A;  . CARDIAC CATHETERIZATION N/A 06/14/2016   Minimal nonobstructive dz, EF 25-35%.  Procedure: Left Heart Cath and Coronary Angiography;  Surgeon: Peter M Martinique, MD;  Location: [place] [facility];  Service: Cardiovascular;  Laterality: N/A;  . CARDIOVASCULAR STRESS  TEST  2012   2012 nuclear perfusion study: low risk scan; 04/2016 normal myocardial perfusion imaging, EF 32%.  [name] CARDIOVERSION  07/09/2012   Procedure: CARDIOVERSION;  Surgeon: [name], MD;  Location: [place];  Service: Cardiovascular;  Laterality: N/A;  . CATARACT EXTRACTION W/ INTRAOCULAR LENS IMPLANT & ANTERIOR VITRECTOMY, BILATERAL Bilateral   . COLONOSCOPY W/ POLYPECTOMY  approx 2006; repeated 09/2011   Polyps on 2013 EGD as well, repeat 12/2014  . ESOPHAGOGASTRODUODENOSCOPY  10/18/06   Done due to chronic GERD: Normal, bx showed no barrett's esophagus (Dr. [name])  . FLEXOR TENDON REPAIR Left 10/02/2016   Procedure: LEFT RING FINGER WOUND EXPORATION AND FLEXOR TENDON REPAIR AND NERVE REPAIR;  Surgeon: Milly Jakob, MD;  Location: [place];  Service: Orthopedics;   Laterality: Left;  . INSERT / REPLACE / REMOVE PACEMAKER  02/05/2012   dual chamber, sinus node dysfunction, sinus arrest, PAF, Medtronic Revo serial#-[id number] H: last checked 05/2015  . LUMBAR LAMINECTOMY Left 1976   L4-5  . PACEMAKER REMOVAL  01/04/2017  . PERMANENT PACEMAKER INSERTION N/A 02/05/2012   Procedure: PERMANENT PACEMAKER INSERTION;  Surgeon: [name], MD; Generator Medtronic Revo model [place] serial number [id number] H Laterality: N/A;  . RETINAL DETACHMENT SURGERY Left ~ 1999  . REVERSE SHOULDER ARTHROPLASTY Left 2018   Left shoulder reverse [name] in [place]).  [name] RIGHT/LEFT HEART CATH AND CORONARY ANGIOGRAPHY N/A 03/22/2018   EF 30-35%, no CAD.  Procedure: RIGHT/LEFT HEART CATH AND CORONARY ANGIOGRAPHY;  Surgeon: Jolaine Artist, MD;  Location: [place] [facility];  Service: Cardiovascular;  Laterality: N/A;  . TRANSTHORACIC ECHOCARDIOGRAM  08/25/10; 05/2012; 03/23/16;12/2017   mild asymmetric LVH, normal systolic function, normal diastolic fxn, mild-to-mod mitral regurg, mild aortic valve sclerosis and trace AI, mild aortic root dilatation. 2014 f/u showed EF 40-45%, mod LAE, A FIB.  02/2016 EF 40%, diffuse hypokinesis, grade 2 DD. 12/2017 EF 35-40%,diffuse hypokin,grd III DD, mild MR    Current Medications: Outpatient Medications Prior to Visit  Medication Sig Dispense Refill  . acetaminophen (TYLENOL) 500 MG tablet Take 1,000 mg by mouth every 6 (six) hours as needed for moderate pain or headache.    . colchicine 0.6 MG tablet Take 0.6 mg by mouth daily as needed (for gout).     . COMBIGAN 0.2-0.5 % ophthalmic solution Place 1 drop into both eyes at bedtime.     . empagliflozin (JARDIANCE) 10 MG TABS tablet Take 10 mg by mouth daily. 90 tablet 0  . ENTRESTO 97-103 MG TAKE 1 TABLET TWICE A DAY 180 tablet 3  . gabapentin (NEURONTIN) 300 MG capsule Take 1 capsule (300 mg total) by mouth 3 (three) times daily. 270 capsule 3  . glipiZIDE (GLUCOTROL XL) 10 MG 24  hr tablet TAKE 1 TABLET EVERY MORNING (NEED OFFICE VISIT FOR MORE REFILLS) 90 tablet 1  . glucose blood test strip Use to check blood sugars 1-2 times daily 100 each 3  . Insulin Glargine (LANTUS SOLOSTAR) 100 UNIT/ML Solostar Pen 12 units SQ qhs (Patient taking differently: 14 units SQ qhs) 15 pen 4  . Insulin Pen Needle (PEN NEEDLES) 31G X 5 MM MISC 1 each by Does not apply route daily. 100 each 5  . Lancets (FREESTYLE) lancets Use as instructed 100 each 6  . latanoprost (XALATAN) 0.005 % ophthalmic solution Place 1 drop into both eyes at bedtime.   12  . meclizine (ANTIVERT) 25 MG tablet Take 1 tablet (25 mg total) 3 (three) times daily as needed by  mouth for dizziness. 270 tablet 3  . metoprolol succinate (TOPROL-XL) 50 MG 24 hr tablet Take 1 tablet (50 mg total) by mouth daily. Take with or immediately following a meal. 30 tablet 11  . omeprazole (PRILOSEC) 40 MG capsule Take 40 mg by mouth at bedtime.     [name] oxybutynin (DITROPAN-XL) 5 MG 24 hr tablet Take 1 tablet (5 mg total) by mouth at bedtime. 30 tablet 3  . rOPINIRole (REQUIP) 1 MG tablet TAKE 1 TABLET DAILY 90 tablet 3  . sertraline (ZOLOFT) 100 MG tablet TAKE 1 TABLET DAILY 90 tablet 4  . simvastatin (ZOCOR) 20 MG tablet Take 1 tablet (20 mg total) by mouth every evening. 90 tablet 1  . XARELTO 20 MG TABS tablet TAKE 1 TABLET DAILY WITH SUPPER 90 tablet 1  . spironolactone (ALDACTONE) 25 MG tablet Take 0.5 tablets (12.5 mg total) by mouth daily. 45 tablet 1   No facility-administered medications prior to visit.      Allergies:   Patient has no known allergies.   Social History   Socioeconomic History  . Marital status: Married    Spouse name: Not on file  . Number of children: Not on file  . Years of education: Not on file  . Highest education level: Not on file  Occupational History  . Not on file  Social Needs  . Financial resource strain: Not on file  . Food insecurity:    Worry: Not on file    Inability: Not on file   . Transportation needs:    Medical: Not on file    Non-medical: Not on file  Tobacco Use  . Smoking status: Never Smoker  . Smokeless tobacco: Never Used  Substance and Sexual Activity  . Alcohol use: Yes    Comment: occ  . Drug use: No  . Sexual activity: Not on file  Lifestyle  . Physical activity:    Days per week: Not on file    Minutes per session: Not on file  . Stress: Not on file  Relationships  . Social connections:    Talks on phone: Not on file    Gets together: Not on file    Attends religious service: Not on file    Active member of club or organization: Not on file    Attends meetings of clubs or organizations: Not on file    Relationship status: Not on file  Other Topics Concern  . Not on file  Social History Narrative  . Not on file     Family History:  The patient's family history includes Heart failure in his mother; Stroke in his father and mother.   ROS:   Please see the history of present illness.    ROS all other systems are reviewed and are negative   PHYSICAL EXAM:   VS:  BP 108/76   Pulse 79   Ht 5\' 11"  (1.803 m)   Wt 231 lb (104.8 kg)   BMI 32.22 kg/m       General: Alert, oriented x3, no distress, healthy left subclavian pacemaker site Head: no evidence of trauma, PERRL, EOMI, no exophtalmos or lid lag, no myxedema, no xanthelasma; normal ears, nose and oropharynx Neck: normal jugular venous pulsations and no hepatojugular reflux; brisk carotid pulses without delay and no carotid bruits Chest: clear to auscultation, no signs of consolidation by percussion or palpation, normal fremitus, symmetrical and full respiratory excursions Cardiovascular: normal position and quality of the apical impulse, regular rhythm, normal  first and paradoxically split second heart sounds, no murmurs, rubs or gallops Abdomen: no tenderness or distention, no masses by palpation, no abnormal pulsatility or arterial bruits, normal bowel sounds, no  hepatosplenomegaly Extremities: no clubbing, cyanosis or edema; 2+ radial, ulnar and brachial pulses bilaterally; 2+ right femoral, posterior tibial and dorsalis pedis pulses; 2+ left femoral, posterior tibial and dorsalis pedis pulses; no subclavian or femoral bruits Neurological: grossly nonfocal Psych: Normal mood and affect   Wt Readings from Last 3 Encounters:  07/23/18 231 lb (104.8 kg)  06/26/18 231 lb 8 oz (105 kg)  05/29/18 224 lb 4 oz (101.7 kg)     Studies/Labs Reviewed:   EKG:  EKG is ordered today.  Shows atrial fibrillation with 100% biventricular paced rhythm with positive R waves in leads V1 and V2 consistent with effective LV pacing  Recent Labs: 07/27/2017: ALT 14 04/04/2018: BUN 17; Creatinine, Ser 1.21; Hemoglobin 14.4; Platelets 149; Potassium 4.3; Sodium 136; TSH 2.963   Lipid Panel    Component Value Date/Time   CHOL 137 03/23/2018 0500   TRIG 154 (H) 03/23/2018 0500   HDL 33 (L) 03/23/2018 0500   CHOLHDL 4.2 03/23/2018 0500   VLDL 31 03/23/2018 0500   LDLCALC 73 03/23/2018 0500     ASSESSMENT:    1. Chronic systolic heart failure ([place])   2. Complete heart block (HCC)   3. Permanent atrial fibrillation   4. Biventricular automatic implantable cardioverter defibrillator in situ   5. Essential hypertension   6. Bilateral leg pain   7. Diabetes mellitus type 2 in obese ([place])   8. Drug-induced gynecomastia      PLAN:  In order of problems listed above:  1. CHF: On max dose Entresto and felt better when we reduced his dose of beta-blocker.  Will switch from spironolactone to eplerenone due to gynecomastia.  At least based on heart rate histogram distribution he has appropriate sensor settings.  He is euvolemic and has good functional status.  Another cardiopulmonary stress test might be indicated if he still does not feel optimally treated.  He has a follow-up appointment with Dr. Haroldine Laws in about a month.  2. CHB: Pacemaker dependent, no underlying  escape rhythm 3. AFib: Minute arrhythmia, appropriately anticoagulated. CHADSVasc 5 (age 20, diabetes, heart failure, hypertension). 4. CRT-D: Normal device function.  Based on heart rate histogram distribution his sensor settings are now appropriate.  I did not make any additional changes.  Will discuss with Dr. Haroldine Laws whether it is worth performing another stress test. 5. HTN: Excellent control. 6. Intermittent claudication: He describes bilateral calf tightness when he tries to walk up stairs or uphill.  His vascular ultrasound showed only very mild abnormality on the left side, his symptoms are not likely to be due to arterial insufficiency. 7. DM/obesity/dyslipidemia: Although he has not had any serious vascular problems, he is at risk for developing these.  Encouraged weight loss.  On statin.  With the exception of a borderline low HDL cholesterol, his other metabolic parameters are reasonably well compensated.    Medication Adjustments/Labs and Tests Ordered: Current medicines are reviewed at length with the patient today.  Concerns regarding medicines are outlined above.  Medication changes, Labs and Tests ordered today are listed in the Patient Instructions below. Patient Instructions  Medication Instructions:  Dr Sallyanne Kuster has recommended making the following medication changes: 1. STOP Spironolactone 2. START Eplerenone 25 mg - take 1 tablet daily  If you need a refill on your cardiac medications before  your next appointment, please call your pharmacy.   Follow-Up: At [facility], you and your health needs are our priority.  As part of our continuing mission to provide you with exceptional heart care, we have created designated Provider Care Teams.  These Care Teams include your primary Cardiologist (physician) and Advanced Practice Providers (APPs -  Physician Assistants and Nurse Practitioners) who all work together to provide you with the care you need, when you need it. You  will need a follow up appointment in 6 months.  Please call our office 2 months in advance to schedule this appointment.  You may see [name], MD or one of the following Advanced Practice Providers on your designated Care Team: [place], [place] . Fabian Sharp, PA-C . You will receive a reminder letter in the mail two months in advance. If you Mason [name]'t receive a letter, please call our office to schedule the follow-up appointment.    Signed, [name], MD  07/24/2018 10:59 AM    [facility] [address], [place], [place]  [address] Phone: [phone]; Fax: [phone]

## 2018-07-25 NOTE — Progress Notes (Signed)
Remote ICD transmission.   

## 2018-07-26 ENCOUNTER — Other Ambulatory Visit: Payer: Self-pay | Admitting: Family Medicine

## 2018-07-28 ENCOUNTER — Encounter: Payer: Self-pay | Admitting: Family Medicine

## 2018-07-29 ENCOUNTER — Other Ambulatory Visit: Payer: Self-pay | Admitting: Internal Medicine

## 2018-07-29 NOTE — Progress Notes (Signed)
Subjective:   Mason [name] is a 78 y.o. male who presents for Medicare Annual/Subsequent preventive examination.  Review of Systems:  No ROS.  Medicare Wellness Visit. Additional risk factors are reflected in the social history.  Cardiac Risk Factors include: advanced age (>16men, >57 women);diabetes mellitus;dyslipidemia;male gender;hypertension;family history of premature cardiovascular disease;obesity (BMI >30kg/m2);sedentary lifestyle   Sleep patterns:Sleeps 6-7 hours, up to void x 5-6 times.  Home Safety/Smoke Alarms: Feels safe in home. Smoke alarms in place.  Living environment; residence and Firearm Safety: Lives with wife in 1 story home. Seat Belt Safety/Bike Helmet: Wears seat belt.   Male:   CCS- Colonoscopy 2013, polyps.Recall 5 years.   PSA- 09/20/2017, 0.840. Followed by Urology.      Objective:    Vitals: BP 122/70 (BP Location: Left Arm, Patient Position: Sitting, Cuff Size: Normal)   Pulse 69   Ht 5\' 11"  (1.803 m)   Wt 231 lb (104.8 kg)   SpO2 98%   BMI 32.22 kg/m   Body mass index is 32.22 kg/m.  Advanced Directives 07/30/2018 03/22/2018 03/18/2018 09/06/2017 07/27/2017 01/04/2017 09/24/2016  Does Patient Have a Medical Advance Directive? Yes No No Yes No No No  Type of Advance Directive Healthcare Power of [place]  Does patient want to make changes to medical advance directive? Yes (MAU/Ambulatory/Procedural Areas - Information given) - - - - - -  Copy of [place] in Chart? No - copy requested - - - - - -  Would patient like information on creating a medical advance directive? - No - Patient declined No - Patient declined - No - Patient declined No - Patient declined -  Pre-existing out of facility DNR order (yellow form or pink MOST form) - - - - - - -    Tobacco Social History   Tobacco Use  Smoking Status Never Smoker  Smokeless Tobacco Never Used     Counseling given: Not Answered   Past  Medical History:  Diagnosis Date  . AICD (automatic cardioverter/defibrillator) present 2018   MDT CRT-D.  Fatigue-->completely pacer dependent.  Pacer settings adjusted 12/2017 to allow more chronotrophc variance with ADL's//exertion.  . Arthritis    Pt is s/p eft reverse total shoulder arthroplasty.  [name] BPH (benign prostatic hypertrophy) 06/2011   Irritative sx's; pt declined trial of anticholinergic per Urology records  . Chronic combined systolic and diastolic heart failure ([place]) 05/31/2012   Nonischemic:  EF 40-45%, LA mod-severe dilated, AFIB.   02/2016 EF 40%, diffuse hypokinesis, grade 2 DD.  Myoc perf imaging showed EF 32% 04/2016.  Pt upgraded to CRT-D 01/04/17.  [name] Chronic renal insufficiency, stage III (moderate) (HCC) 2015   CrCl about 60 ml/min  . Complete heart block (HCC)    Has dual chamber pacer.  . Depression   . DOE (dyspnea on exertion)    NYHA class II/III CHF  . Episodic low back pain 01/22/2013   w/intermittent radiculitis (12/2014 his neurologist referred him to pain mgmt for epidural steroid injection)  . Erectile dysfunction 2019   due to zoloft--urol rx'd viagra  . GERD (gastroesophageal reflux disease)   . H/O tilt table evaluation 11/02/05   negative  . Helicobacter pylori gastritis 01/2016  . High cholesterol   . History of adenomatous polyp of colon 10/12/11   Dr. [name] (3 right side of colon- tubular adenomas removed)  . History of cardiovascular stress test 05/28/12   no ischemia, EF  37%, imaging results are unchanged and within normal variance  . History of chronic prostatitis   . History of kidney stones   . History of vertigo    + Hx of posterior HA's.  Neuro (Dr. Erling Cruz) eval 2011.  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  . Hypertension   . Lumbar spondylosis    lumbosacral radiculopathy at L4 by EMG testing, right foot drop (neurologist is Dr. Linus Salmons with [facility] in [place])--neurologist referred him to neurosurgery  . Migraine    "used to have them all the time; none for years" (01/04/2017)  . Myocardial infarction [facility]) ?1970s   not entirely certain of this  . Nephrolithiasis 07/2012   Left UVJ 2 mm stone with dilation of renal collecting system and slight hydroureter on right  . Neuropathy   . NICM (nonischemic cardiomyopathy) ([place])    a. 02/2018 Cath: LM nl, LAD min irregs, LCX no, RCA 20d. [id number]. Fick CO/CI 4.4/2.0.  . 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  Since no CAD on cath 05/2016, cards recommends upgrade to CRT-D.  [name] Permanent atrial fibrillation    DCCV 07/09/13-converted, lasted two days, then back into afib--needs lifetime anticoagulation (Xarelto as of 09/2014)  . Prostate cancer screening 09/2017   done by urol annually (normal prostate exam documented + PSA 0.84 as of 10/01/17 urol f/u.  [name] Rectus diastasis   . Right ankle sprain 08/2017   w/distal fibula avulsion fx noted on u/s but not plain film-(Dr. Hudnall).  . Skin cancer of arm, left    "burned it off" (01/04/2017)  . TIA (transient ischemic attack)    a. 03/22/2018 following cath. CT head neg.  . Type II diabetes mellitus ([place])    Past Surgical History:  Procedure Laterality Date  . ABI's Bilateral 05/21/2018   normal  . BACK SURGERY    . BIV ICD INSERTION CRT-D N/A 01/04/2017   Procedure: BiV ICD ;  Surgeon: Constance Haw, MD;  Location: [place] [facility];  Service: Cardiovascular;  Laterality: N/A;  . CARDIAC CATHETERIZATION N/A 06/14/2016   Minimal nonobstructive dz, EF 25-35%.  Procedure: Left Heart Cath and Coronary Angiography;  Surgeon: Peter M Martinique, MD;  Location: [place] [facility];  Service: Cardiovascular;  Laterality: N/A;  . CARDIOVASCULAR STRESS TEST  2012   2012 nuclear perfusion study: low risk scan; 04/2016 normal myocardial perfusion imaging, EF 32%.  [name] CARDIOVERSION   07/09/2012   Procedure: CARDIOVERSION;  Surgeon: [name], MD;  Location: [place];  Service: Cardiovascular;  Laterality: N/A;  . CATARACT EXTRACTION W/ INTRAOCULAR LENS IMPLANT & ANTERIOR VITRECTOMY, BILATERAL Bilateral   . COLONOSCOPY W/ POLYPECTOMY  approx 2006; repeated 09/2011   Polyps on 2013 EGD as well, repeat 12/2014  . ESOPHAGOGASTRODUODENOSCOPY  10/18/06   Done due to chronic GERD: Normal, bx showed no barrett's esophagus (Dr. [name])  . FLEXOR TENDON REPAIR Left 10/02/2016   Procedure: LEFT RING FINGER WOUND EXPORATION AND FLEXOR TENDON REPAIR AND NERVE REPAIR;  Surgeon: Milly Jakob, MD;  Location: [place];  Service: Orthopedics;  Laterality: Left;  . INSERT / REPLACE / REMOVE PACEMAKER  02/05/2012   dual chamber, sinus node dysfunction, sinus arrest, PAF, Medtronic Revo serial#-[id number] H: last checked 05/2015  . LUMBAR LAMINECTOMY Left 1976   L4-5  . PACEMAKER REMOVAL  01/04/2017  . PERMANENT PACEMAKER INSERTION N/A 02/05/2012   Procedure: PERMANENT PACEMAKER INSERTION;  Surgeon: [name], MD; Generator Medtronic [place] model [place] serial number [id number] H Laterality: N/A;  . RETINAL DETACHMENT SURGERY Left ~ 1999  . REVERSE SHOULDER ARTHROPLASTY Left 2018   Left shoulder reverse [name] in [place]).  [name] RIGHT/LEFT HEART CATH AND CORONARY ANGIOGRAPHY N/A 03/22/2018   EF 30-35%, no CAD.  Procedure: RIGHT/LEFT HEART CATH AND CORONARY ANGIOGRAPHY;  Surgeon: Jolaine Artist, MD;  Location: [place] [facility];  Service: Cardiovascular;  Laterality: N/A;  . TRANSTHORACIC ECHOCARDIOGRAM  08/25/10; 05/2012; 03/23/16;12/2017   mild asymmetric LVH, normal systolic function, normal diastolic fxn, mild-to-mod mitral regurg, mild aortic valve sclerosis and trace AI, mild aortic root dilatation. 2014 f/u showed EF 40-45%, mod LAE, A FIB.  02/2016 EF 40%, diffuse hypokinesis, grade 2 DD. 12/2017 EF 35-40%,diffuse hypokin,grd III DD, mild MR   Family History  Problem  Relation Age of Onset  . Heart failure Mother   . Stroke Mother   . Stroke Father   . Heart disease Sister   . Heart disease Sister   . Cancer Sister        liver  . Cancer Brother        lung  . Cancer Brother        lung  . Heart disease Brother    Social History   Socioeconomic History  . Marital status: Married    Spouse name: Not on file  . Number of children: Not on file  . Years of education: Not on file  . Highest education level: Not on file  Occupational History  . Not on file  Social Needs  . Financial resource strain: Not on file  . Food insecurity:    Worry: Not on file    Inability: Not on file  . Transportation needs:    Medical: Not on file    Non-medical: Not on file  Tobacco Use  . Smoking status: Never Smoker  . Smokeless tobacco: Never Used  Substance and Sexual Activity  . Alcohol use: Yes    Comment: occ  . Drug use: No  . Sexual activity: Not on file  Lifestyle  . Physical activity:    Days per week: Not on file    Minutes per session: Not on file  . Stress: Not on file  Relationships  . Social connections:    Talks on phone: Not on file    Gets together: Not on file    Attends religious service: Not on file    Active member of club or organization: Not on file    Attends meetings of clubs or organizations: Not on file    Relationship status: Not on file  Other Topics Concern  . Not on file  Social History Narrative  . Not on file    Outpatient Encounter Medications as of 07/30/2018  Medication Sig  . acetaminophen (TYLENOL) 500 MG tablet Take 1,000 mg by mouth every 6 (six) hours as needed for moderate pain or headache.  . colchicine 0.6 MG tablet Take 0.6 mg by mouth daily as needed (for gout).   . COMBIGAN 0.2-0.5 % ophthalmic solution Place 1 drop into both eyes at bedtime.   . empagliflozin (JARDIANCE) 10 MG TABS tablet Take 10 mg by mouth daily.  [name] ENTRESTO 97-103 MG TAKE 1 TABLET TWICE A DAY  . eplerenone (INSPRA) 25 MG  tablet Take 1 tablet (25 mg total) by mouth daily.  [name] gabapentin (NEURONTIN) 300 MG capsule Take 1 capsule (300 mg total) by  mouth 3 (three) times daily.  [name] glipiZIDE (GLUCOTROL XL) 10 MG 24 hr tablet TAKE 1 TABLET EVERY MORNING (NEED OFFICE VISIT FOR MORE REFILLS)  . glucose blood test strip Use to check blood sugars 1-2 times daily  . Insulin Glargine (LANTUS SOLOSTAR) 100 UNIT/ML Solostar Pen 12 units SQ qhs (Patient taking differently: 17 units SQ qhs)  . Insulin Pen Needle (PEN NEEDLES) 31G X 5 MM MISC 1 each by Does not apply route daily.  . Lancets (FREESTYLE) lancets Use as instructed  . latanoprost (XALATAN) 0.005 % ophthalmic solution Place 1 drop into both eyes at bedtime.   . meclizine (ANTIVERT) 25 MG tablet Take 1 tablet (25 mg total) 3 (three) times daily as needed by mouth for dizziness.  . metoprolol succinate (TOPROL-XL) 50 MG 24 hr tablet Take 1 tablet (50 mg total) by mouth daily. Take with or immediately following a meal.  . omeprazole (PRILOSEC) 40 MG capsule TAKE 1 CAPSULE DAILY  . rOPINIRole (REQUIP) 1 MG tablet TAKE 1 TABLET DAILY  . sertraline (ZOLOFT) 100 MG tablet TAKE 1 TABLET DAILY  . simvastatin (ZOCOR) 20 MG tablet Take 1 tablet (20 mg total) by mouth every evening.  Alveda Reasons 20 MG TABS tablet TAKE 1 TABLET DAILY WITH SUPPER  . oxybutynin (DITROPAN-XL) 5 MG 24 hr tablet Take 1 tablet (5 mg total) by mouth at bedtime. (Patient not taking: Reported on 07/30/2018)   No facility-administered encounter medications on file as of 07/30/2018.     Activities of Daily Living In your present state of health, do you have any difficulty performing the following activities: 07/30/2018 04/24/2018  Hearing? Y N  Vision? N N  Difficulty concentrating or making decisions? N N  Walking or climbing stairs? Y N  Comment SOE -  Dressing or bathing? N N  Doing errands, shopping? N N  Preparing Food and eating ? N -  Using the Toilet? N -  In the past six months, have you accidently  leaked urine? Y -  Do you have problems with loss of bowel control? N -  Managing your Medications? N -  Managing your Finances? N -  Housekeeping or managing your Housekeeping? N -  Some recent data might be hidden    Patient Care Team: Tammi Sou, MD as PCP - General (Family Medicine) Constance Haw, MD as PCP - Electrophysiology (Cardiology) Croitoru, Dani Gobble, MD as PCP - Cardiology (Cardiology) Carol Ada, MD as Consulting Physician (Gastroenterology) Kathie Rhodes, MD as Consulting Physician (Urology) Troy Sine, MD as Consulting Physician (Cardiology) Audery Amel, MD as Referring Physician (Neurology) Clent Jacks, MD as Consulting Physician (Ophthalmology) Lance Muss, DO as Consulting Physician (Orthopedic Surgery) Aleda Grana (Dentistry) Renard Hamper Franchot Gallo, PA-C as Consulting Physician (Physician Assistant)   Assessment:   This is a routine wellness examination for [place].  Exercise Activities and Dietary recommendations Current Exercise Habits: The patient does not participate in regular exercise at present, Exercise limited by: respiratory conditions(s)   Diet (meal preparation, eat out, water intake, caffeinated beverages, dairy products, fruits and vegetables): Drinks water and coffee.   Breakfast: cereal; bagel/crm chz; egg,bacon, toast; oatmeal Lunch: skips; fast food Dinner: grilled cheese, tomato soup; meatloaf; protein  Goals      Patient Stated   . patient states (pt-stated)     "get my heart back". Plans to increase water intake and exercise.       Other   . Patient Stated     Maintain  current health by eating healthier.     . Weight (lb) < 210 lb (95.3 kg)     Lose weight by eating less.       Fall Risk Fall Risk  07/30/2018 07/27/2017 06/26/2016 04/26/2016 04/22/2015  Falls in the past year? 1 Yes Yes No No  Comment dizzy, fell at shed; "chicken work" - - - -  Number falls in past yr: 1 1 2  or more - -  Injury  with Fall? 0 No No - -  Risk for fall due to : Impaired balance/gait - - - -  Follow up Falls prevention discussed Falls prevention discussed Falls prevention discussed - -    Depression Screen PHQ 2/9 Scores 07/30/2018 04/24/2018 10/26/2017 07/27/2017  PHQ - 2 Score 3 0 3 3  PHQ- 9 Score 12 0 17 13    Cognitive Function MMSE - Mini Mental State Exam 07/30/2018  Orientation to time 5  Orientation to Place 5  Registration 3  Attention/ Calculation 3  Recall 2  Language- name 2 objects 2  Language- repeat 1  Language- follow 3 step command 3  Language- read & follow direction 1  Write a sentence 1  Copy design 1  Total score 27        Immunization History  Administered Date(s) Administered  . Influenza, High Dose Seasonal PF 05/26/2013, 05/20/2015, 06/26/2016, 05/11/2017, 04/24/2018  . Influenza,inj,Quad PF,6+ Mos 06/15/2014  . Pneumococcal Conjugate-13 10/15/2014  . Pneumococcal Polysaccharide-23 07/24/2010  . Tdap 08/30/2011     Screening Tests Health Maintenance  Topic Date Due  . OPHTHALMOLOGY EXAM  03/27/2018  . HEMOGLOBIN A1C  11/13/2018  . FOOT EXAM  02/07/2019  . TETANUS/TDAP  08/29/2021  . INFLUENZA VACCINE  Completed  . PNA vac Low Risk Adult  Completed        Plan:    Bring a copy of your living will and/or healthcare power of attorney to your next office visit.  Continue doing brain stimulating activities (puzzles, reading, adult coloring books, staying active) to keep memory sharp.   I have personally reviewed and noted the following in the patient's chart:   . Medical and social history . Use of alcohol, tobacco or illicit drugs  . Current medications and supplements . Functional ability and status . Nutritional status . Physical activity . Advanced directives . List of other physicians . Hospitalizations, surgeries, and ER visits in previous 12 months . Vitals . Screenings to include cognitive, depression, and falls . Referrals and  appointments  In addition, I have reviewed and discussed with patient certain preventive protocols, quality metrics, and best practice recommendations. A written personalized care plan for preventive services as well as general preventive health recommendations were provided to patient.     Gerilyn Nestle, RN  07/30/2018  PCP Notes: -Declines CCS -PHQ9=12, currently taking Zoloft 10mg  daily and seeing therapist. Pt states he "irritates easily" (mostly with wife).  -F/U with PCP 08/2018

## 2018-07-30 ENCOUNTER — Ambulatory Visit (INDEPENDENT_AMBULATORY_CARE_PROVIDER_SITE_OTHER): Payer: Medicare Other

## 2018-07-30 ENCOUNTER — Other Ambulatory Visit: Payer: Self-pay

## 2018-07-30 VITALS — BP 122/70 | HR 69 | Ht 71.0 in | Wt 231.0 lb

## 2018-07-30 DIAGNOSIS — Z0000 Encounter for general adult medical examination without abnormal findings: Secondary | ICD-10-CM | POA: Diagnosis not present

## 2018-07-30 DIAGNOSIS — E669 Obesity, unspecified: Secondary | ICD-10-CM

## 2018-07-30 NOTE — Patient Instructions (Addendum)
Bring a copy of your living will and/or healthcare power of attorney to your next office visit.  Continue doing brain stimulating activities (puzzles, reading, adult coloring books, staying active) to keep memory sharp.    Health Maintenance, Male A healthy lifestyle and preventive care is important for your health and wellness. Ask your health care provider about what schedule of regular examinations is right for you. What should I know about weight and diet? Eat a Healthy Diet  Eat plenty of vegetables, fruits, whole grains, low-fat dairy products, and lean protein.  Do not eat a lot of foods high in solid fats, added sugars, or salt.  Maintain a Healthy Weight Regular exercise can help you achieve or maintain a healthy weight. You should:  Do at least 150 minutes of exercise each week. The exercise should increase your heart rate and make you sweat (moderate-intensity exercise).  Do strength-training exercises at least twice a week. Watch Your Levels of Cholesterol and Blood Lipids  Have your blood tested for lipids and cholesterol every 5 years starting at 76 years of age. If you are at high risk for heart disease, you should start having your blood tested when you are 76 years old. You may need to have your cholesterol levels checked more often if: ? Your lipid or cholesterol levels are high. ? You are older than 76 years of age. ? You are at high risk for heart disease. What should I know about cancer screening? Many types of cancers can be detected early and may often be prevented. Lung Cancer  You should be screened every year for lung cancer if: ? You are a current smoker who has smoked for at least 30 years. ? You are a former smoker who has quit within the past 15 years.  Talk to your health care provider about your screening options, when you should start screening, and how often you should be screened. Colorectal Cancer  Routine colorectal cancer screening usually  begins at 76 years of age and should be repeated every 5-10 years until you are 75 years old. You may need to be screened more often if early forms of precancerous polyps or small growths are found. Your health care provider may recommend screening at an earlier age if you have risk factors for colon cancer.  Your health care provider may recommend using home test kits to check for hidden blood in the stool.  A small camera at the end of a tube can be used to examine your colon (sigmoidoscopy or colonoscopy). This checks for the earliest forms of colorectal cancer. Prostate and Testicular Cancer  Depending on your age and overall health, your health care provider may do certain tests to screen for prostate and testicular cancer.  Talk to your health care provider about any symptoms or concerns you have about testicular or prostate cancer. Skin Cancer  Check your skin from head to toe regularly.  Tell your health care provider about any new moles or changes in moles, especially if: ? There is a change in a mole's size, shape, or color. ? You have a mole that is larger than a pencil eraser.  Always use sunscreen. Apply sunscreen liberally and repeat throughout the day.  Protect yourself by wearing long sleeves, pants, a wide-brimmed hat, and sunglasses when outside. What should I know about heart disease, diabetes, and high blood pressure?  If you are 18-39 years of age, have your blood pressure checked every 3-5 years. If you are 40   years of age or older, have your blood pressure checked every year. You should have your blood pressure measured twice-once when you are at a hospital or clinic, and once when you are not at a hospital or clinic. Record the average of the two measurements. To check your blood pressure when you are not at a hospital or clinic, you can use: ? An automated blood pressure machine at a pharmacy. ? A home blood pressure monitor.  Talk to your health care provider  about your target blood pressure.  If you are between 45-79 years old, ask your health care provider if you should take aspirin to prevent heart disease.  Have regular diabetes screenings by checking your fasting blood sugar level. ? If you are at a normal weight and have a low risk for diabetes, have this test once every three years after the age of 45. ? If you are overweight and have a high risk for diabetes, consider being tested at a younger age or more often.  A one-time screening for abdominal aortic aneurysm (AAA) by ultrasound is recommended for men aged 65-75 years who are current or former smokers. What should I know about preventing infection? Hepatitis B If you have a higher risk for hepatitis B, you should be screened for this virus. Talk with your health care provider to find out if you are at risk for hepatitis B infection. Hepatitis C Blood testing is recommended for:  Everyone born from 1945 through 1965.  Anyone with known risk factors for hepatitis C. Sexually Transmitted Diseases (STDs)  You should be screened each year for STDs including gonorrhea and chlamydia if: ? You are sexually active and are younger than 76 years of age. ? You are older than 76 years of age and your health care provider tells you that you are at risk for this type of infection. ? Your sexual activity has changed since you were last screened and you are at an increased risk for chlamydia or gonorrhea. Ask your health care provider if you are at risk.  Talk with your health care provider about whether you are at high risk of being infected with HIV. Your health care provider may recommend a prescription medicine to help prevent HIV infection. What else can I do?  Schedule regular health, dental, and eye exams.  Stay current with your vaccines (immunizations).  Do not use any tobacco products, such as cigarettes, chewing tobacco, and e-cigarettes. If you need help quitting, ask your health  care provider.  Limit alcohol intake to no more than 2 drinks per day. One drink equals 12 ounces of beer, 5 ounces of wine, or 1 ounces of hard liquor.  Do not use street drugs.  Do not share needles.  Ask your health care provider for help if you need support or information about quitting drugs.  Tell your health care provider if you often feel depressed.  Tell your health care provider if you have ever been abused or do not feel safe at home. This information is not intended to replace advice given to you by your health care provider. Make sure you discuss any questions you have with your health care provider. Document Released: 01/06/2008 Document Revised: 03/08/2016 Document Reviewed: 04/13/2015 Elsevier Interactive Patient Education  2019 Elsevier Inc.  

## 2018-07-30 NOTE — Progress Notes (Signed)
AWV reviewed and agree. Signed:  Crissie Sickles, MD           07/30/2018

## 2018-08-16 ENCOUNTER — Encounter (HOSPITAL_COMMUNITY): Payer: Medicare Other | Admitting: Internal Medicine

## 2018-08-23 LAB — CUP PACEART INCLINIC DEVICE CHECK
Date Time Interrogation Session: 20200131162716
Implantable Lead Implant Date: 20130715
Implantable Lead Implant Date: 20180614
Implantable Lead Implant Date: 20180614
Implantable Lead Location: 753858
Implantable Lead Location: 753859
Implantable Lead Location: 753860
Implantable Lead Model: 4598
Implantable Pulse Generator Implant Date: 20180614

## 2018-08-25 ENCOUNTER — Other Ambulatory Visit: Payer: Self-pay | Admitting: Family Medicine

## 2018-08-26 ENCOUNTER — Encounter (HOSPITAL_COMMUNITY): Payer: Self-pay | Admitting: Internal Medicine

## 2018-08-26 ENCOUNTER — Ambulatory Visit (HOSPITAL_COMMUNITY)
Admission: RE | Admit: 2018-08-26 | Discharge: 2018-08-26 | Disposition: A | Discharge status: Home / Self Care | Payer: Medicare Other | Source: Ambulatory Visit | Attending: Internal Medicine | Admitting: Internal Medicine

## 2018-08-26 VITALS — BP 115/80 | HR 90 | Wt 232.2 lb

## 2018-08-26 DIAGNOSIS — I4821 Permanent atrial fibrillation: Secondary | ICD-10-CM | POA: Diagnosis not present

## 2018-08-26 DIAGNOSIS — I5022 Chronic systolic (congestive) heart failure: Secondary | ICD-10-CM | POA: Diagnosis not present

## 2018-08-26 LAB — BRAIN NATRIURETIC PEPTIDE: B Natriuretic Peptide: 176.7 pg/mL — ABNORMAL HIGH (ref 0.0–100.0)

## 2018-08-26 LAB — BASIC METABOLIC PANEL
Anion gap: 11 (ref 5–15)
BUN: 19 mg/dL (ref 8–23)
CO2: 23 mmol/L (ref 22–32)
Calcium: 9.6 mg/dL (ref 8.9–10.3)
Chloride: 104 mmol/L (ref 98–111)
Creatinine, Ser: 1.45 mg/dL — ABNORMAL HIGH (ref 0.61–1.24)
GFR calc Af Amer: 54 mL/min — ABNORMAL LOW (ref >60)
GFR calc non Af Amer: 46 mL/min — ABNORMAL LOW (ref >60)
Glucose, Bld: 201 mg/dL — ABNORMAL HIGH (ref 70–99)
Potassium: 4.6 mmol/L (ref 3.5–5.1)
Sodium: 138 mmol/L (ref 135–145)

## 2018-08-26 IMAGING — DX DG KNEE COMPLETE 4+V*R*
4 series · 4 of 4 positions shown · non-contrast
Comparison: None.

CLINICAL DATA: Right knee pain and swelling for 3-4 days without
known trauma.

EXAM:
RIGHT KNEE - COMPLETE 4+ VIEW

[knee ap]
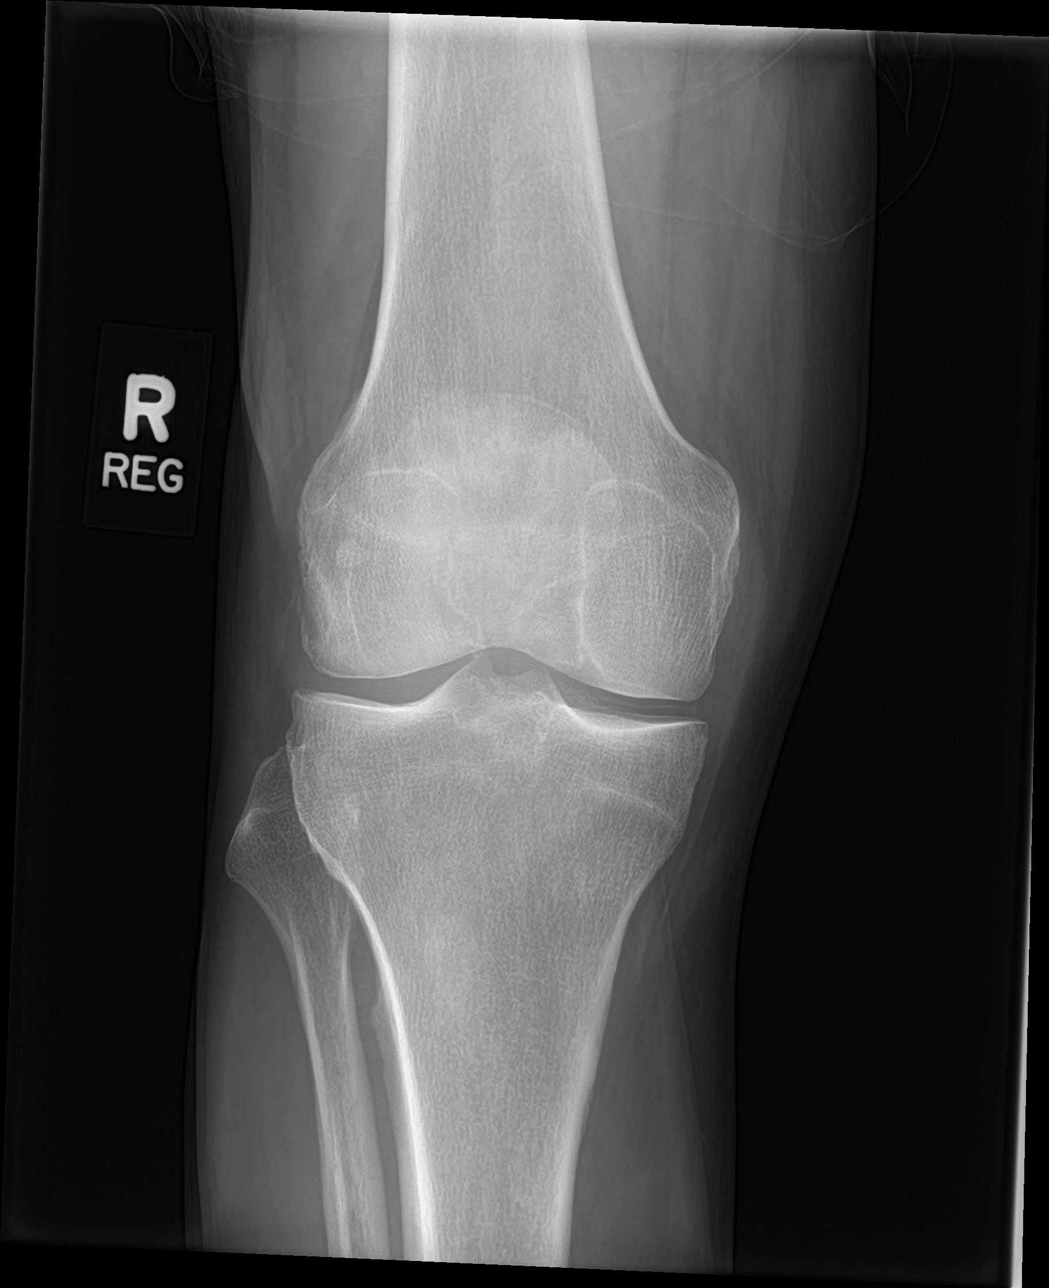

[knee lat]
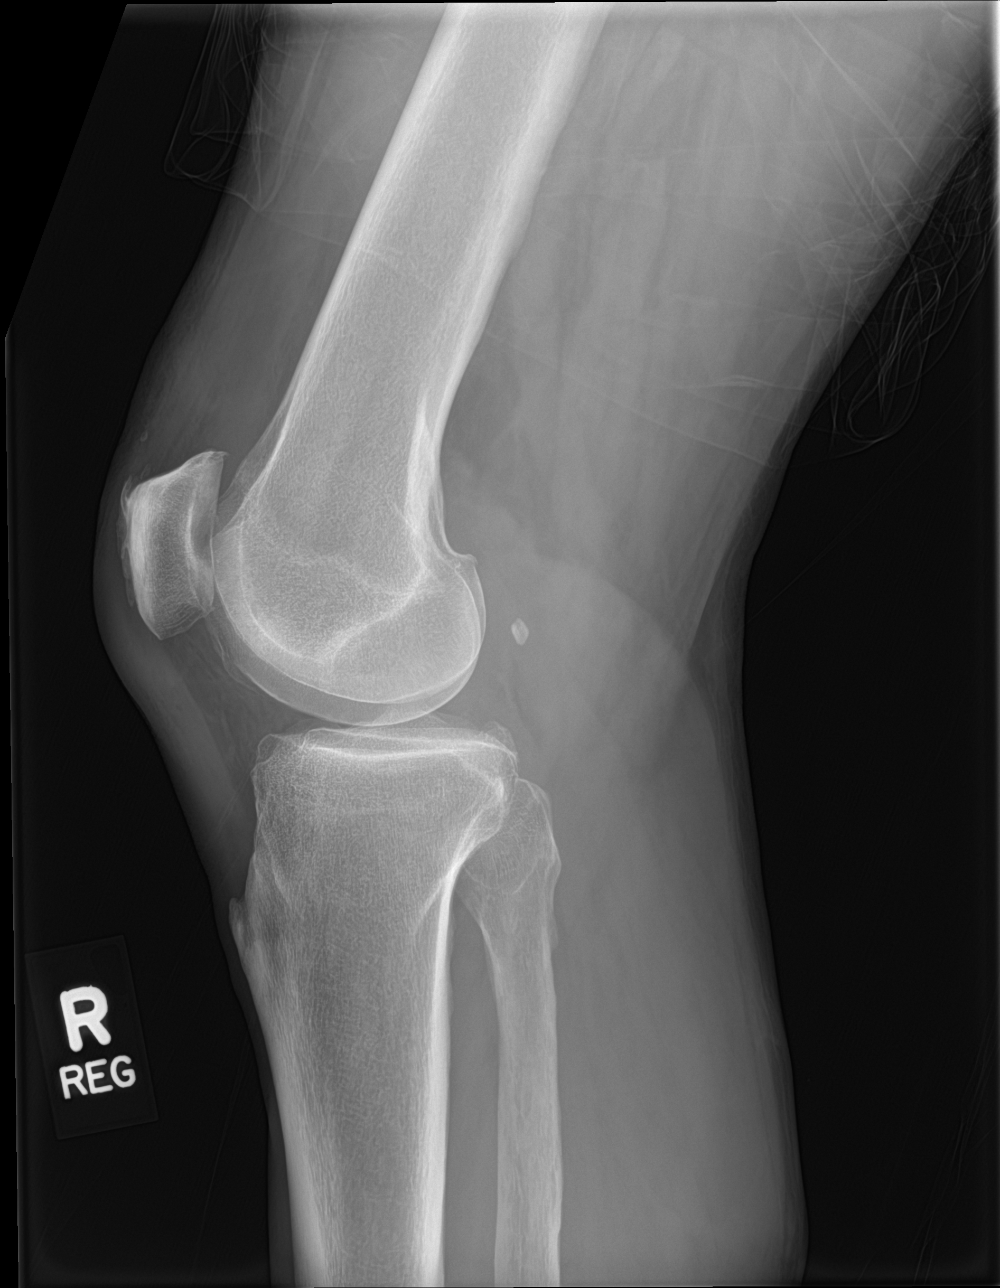

[knee obl (1 of 2)]
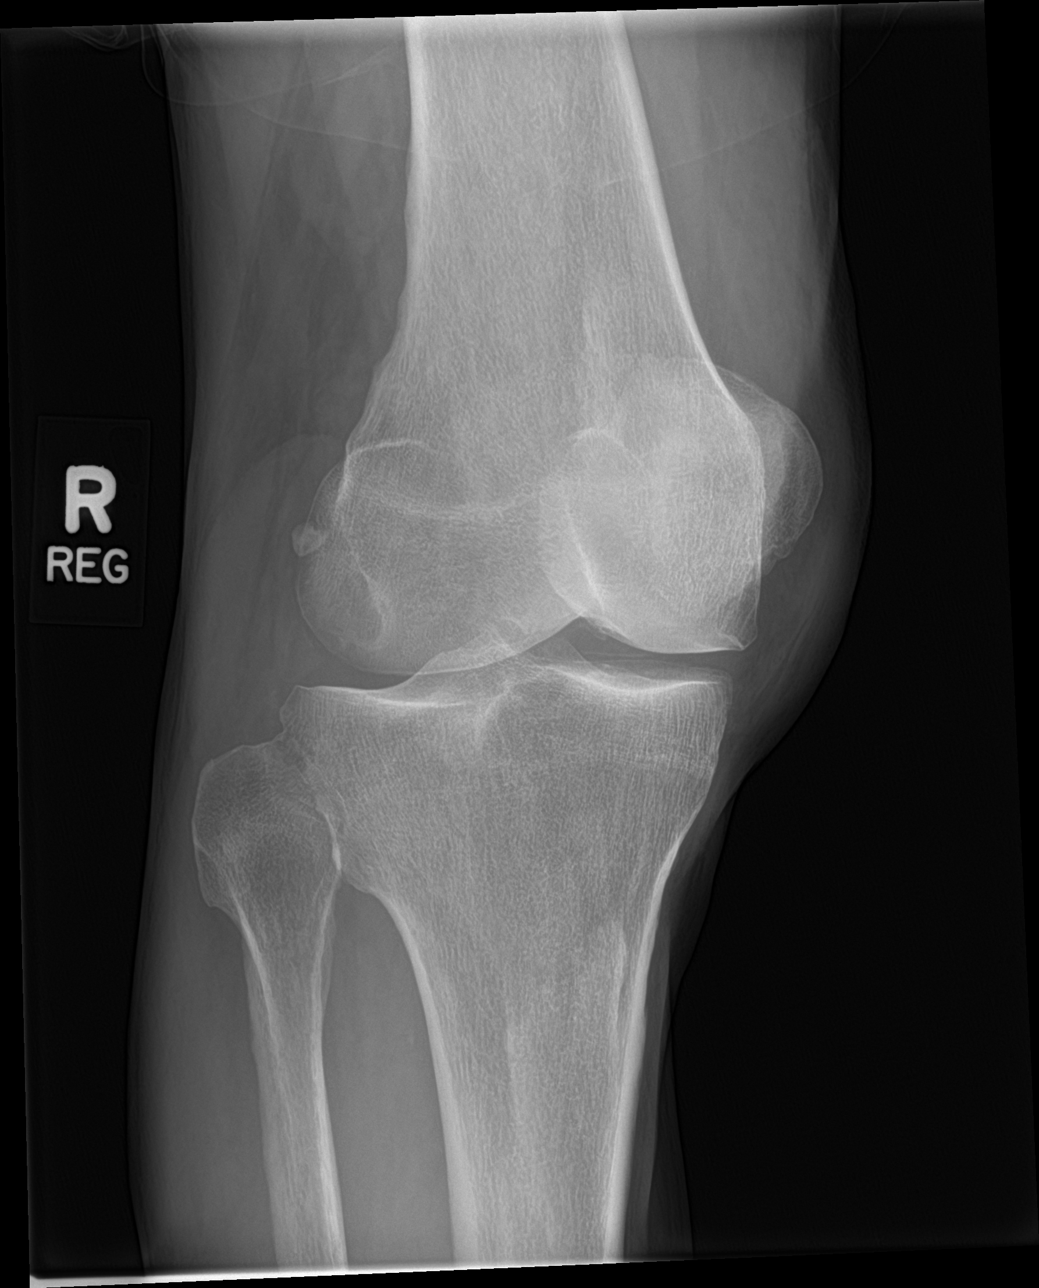

[knee obl (2 of 2)]
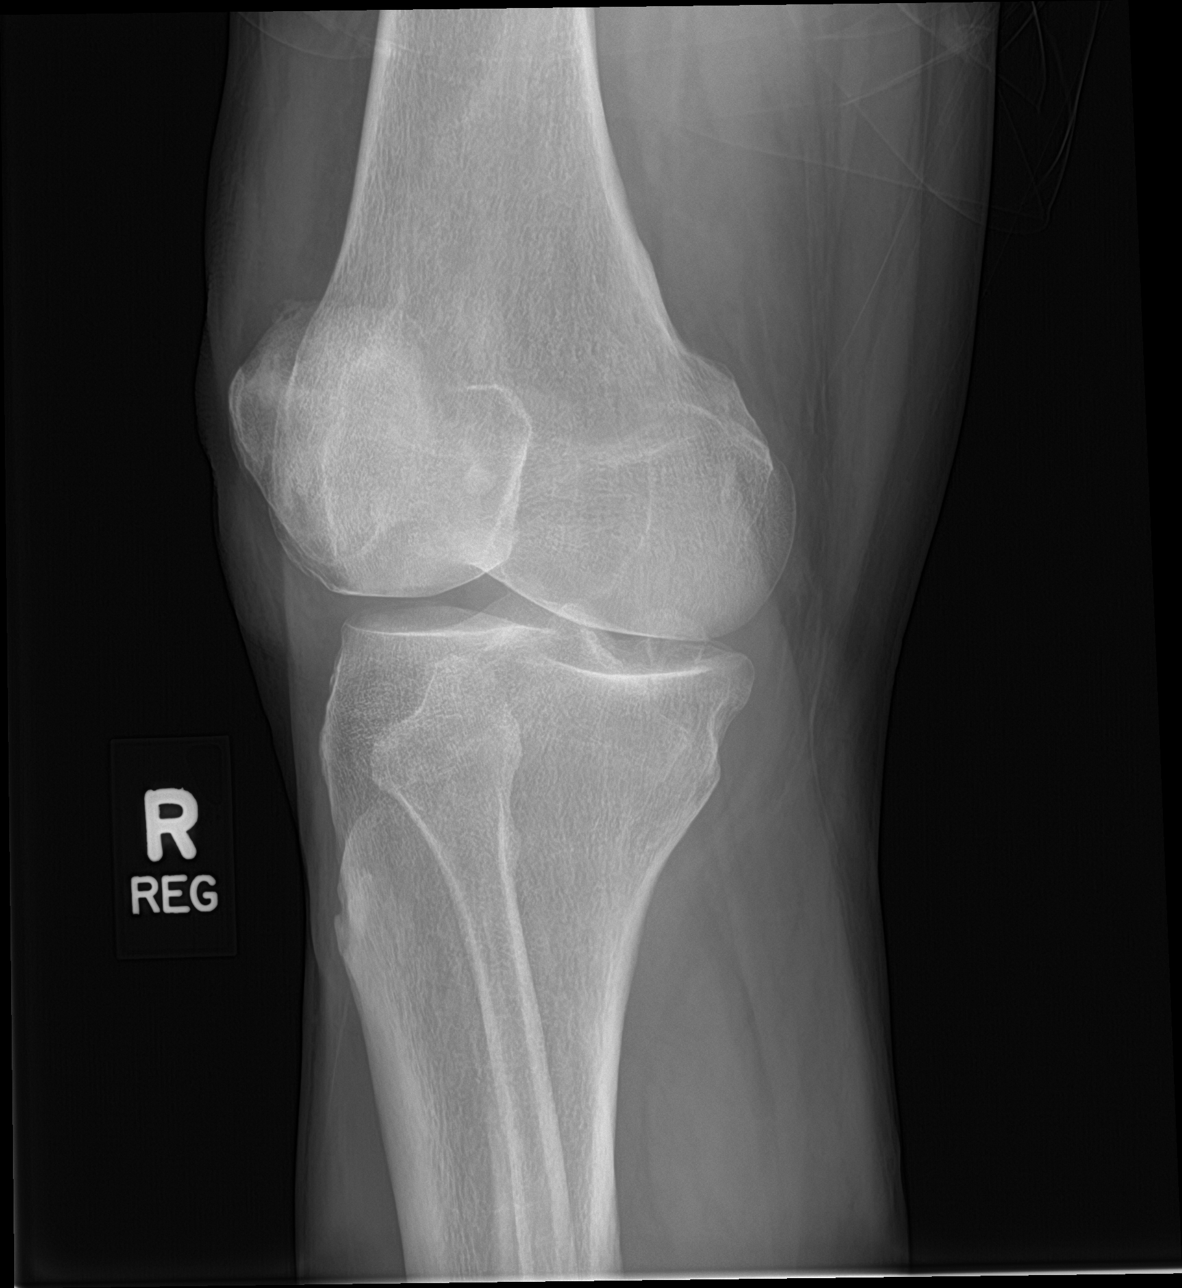

[4 of 4 positions shown; findings below may reference images not displayed]

FINDINGS: Minimal degenerative changes in the patellofemoral compartment. No
joint effusion. No fracture or dislocation.
IMPRESSION: Minimal degenerative changes in the patellofemoral compartment.

## 2018-08-26 NOTE — Telephone Encounter (Signed)
Has f/u apt with Dr. Anitra Lauth on 08/29/18. Will review after apt.

## 2018-08-26 NOTE — Patient Instructions (Addendum)
Labs were done today. We will call you with any ABNORMAL results. No news is good news!  You have been referred to Cardiology at [facility] location.  [address], [place], [place] [address]  This office will call you to schedule an appointment with you.

## 2018-08-26 NOTE — Progress Notes (Signed)
Advanced Heart Failure Clinic Note  PCP: Dr. Shawnie Dapper Primary Cardiologist: Dr. Sallyanne Kuster  HPI: Mason [name] is a 78 y.o. 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   He has been followed by Drs. Croituro and [employer]. He showed a gradual decline in LV systolic function. An echocardiogram in August 2019 showed an EF of 40% with diffuse hypokinesis. In October he had a nuclear stress test that showed all perfusion but an EF of 32%. Has minor coronary atherosclerosis and normal LV EDP on previous cath in 2017. He had a dual chamber Medtronic Revo pacemaker implanted July 2013. Due to 100% V pacing and a drop in his EF, had CRT-D upgrade on 01/04/17.  Underwent R/L cath on 03/22/2018 due to ongoing dyspnea. Cath showed EF 30-35% with minimal CAD and well preserved hemodynamics. Cath was difficult case due to bilateral radial loops and dilated aortic root but able to be completed from left radial. On arriving to short stay post-cath, patient developed acute onset of LUE weakness and L facial droop. Initially unable to lift arm at all. Code stroke activated. By the time patient taken for emergent CT all deficits essentially resolved. Head CT negative. Seen by stroke team who felt he had very distal embolus with symptom resolution. Xarelto was resumed at discharge.   He presents today for regular follow up. Says he feels pretty good. Works around the house without any problem. Currently working on his boat. Can go to the store without a problem. Only time he gets SOB is climbing stairs or walking up hills but even that is getting better. No CP, edema, orthopnea or PND.   Optivol: Personally reviewed, Thoracic impedence just below threshold. Fluid index trending upward. Pt activity ~3 hrs daily. No VT/VF.   Echo 12/2017 LVEF 35-40%  CPX 04/10/18 Pre-Exercise PFTs  FVC 3.69 (87%)    FEV1 2.69 (85%)     FEV1/FVC 73  (97%)     MVV 110 (97%)    Exercise Time:  9:15  Speed (mph): 3.0    Grade (%): 7.5   RPE: 18 Resting HR: 66 Peak HR: 107  (74% age predicted max HR) BP rest: 102/68 BP peak: 174/72 Peak VO2: 16.8 (79% predicted peak VO2) When corrected for IBW of 180.3 lb (81.8 kg) the peak VO2 is 21.3 ml/kg (ibw)/min (84% of the ibw-adjusted predicted) VE/VCO2 slope: 36 OUES: 1.72 Peak RER: 1.12 Ventilatory Threshold: 13.2 (62% predicted and 79% measured peak VO2) Peak RR 26 Peak Ventilation: 49.5 VE/MVV: 45% PETCO2 at peak: 34 O2pulse: 16  (107% predicted O2pulse)  LHC/RHC 03/22/2018  Left Main  Vessel is normal in caliber. Vessel is angiographically normal.  Left Anterior Descending  Vessel is normal in caliber. The vessel exhibits minimal luminal irregularities.  Left Circumflex   Vessel is small. Vessel is angiographically normal.   Right Coronary Artery   Vessel is large. Vessel is angiographically normal.   Dist RCA lesion 20% stenosed   Dist RCA lesion is 20% stenosed.   Findings: Ao = 138/85 (107) LV = 133/16 RA = 7 RV = 33/8 PA = 30/10 (22) PCW = 13 Fick cardiac output/index = 4.4/2.0 PVR = 2.0 WU Ao sat = 98% PA sat = 62%, 62% Assessment/Plan: 1. Minimal CAD 2. 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 Well-compensated filling pressures with moderately reduced CO 4. Complicated radial access bilaterally as described above. Suggest femoral approach for any further  catheterizations  ECHO 2017  Normal LV size with mild LV hypertrophy. EF 40%, diffuse   hypokinesis. Moderate diastolic dysfunction. Normal RV size with   mildly decreased systolic function.  Review of systems complete and found to be negative unless listed in HPI.    SH:  Social History   Socioeconomic History  . Marital status: Married    Spouse name: Not on file  . Number of children: Not on file  . Years of education: Not on file  . Highest education level: Not on file  Occupational History   . Not on file  Social Needs  . Financial resource strain: Not on file  . Food insecurity:    Worry: Not on file    Inability: Not on file  . Transportation needs:    Medical: Not on file    Non-medical: Not on file  Tobacco Use  . Smoking status: Never Smoker  . Smokeless tobacco: Never Used  Substance and Sexual Activity  . Alcohol use: Yes    Comment: occ  . Drug use: No  . Sexual activity: Not on file  Lifestyle  . Physical activity:    Days per week: Not on file    Minutes per session: Not on file  . Stress: Not on file  Relationships  . Social connections:    Talks on phone: Not on file    Gets together: Not on file    Attends religious service: Not on file    Active member of club or organization: Not on file    Attends meetings of clubs or organizations: Not on file    Relationship status: Not on file  . Intimate partner violence:    Fear of current or ex partner: Not on file    Emotionally abused: Not on file    Physically abused: Not on file    Forced sexual activity: Not on file  Other Topics Concern  . Not on file  Social History Narrative  . Not on file   FH:  Family History  Problem Relation Age of Onset  . Heart failure Mother   . Stroke Mother   . Stroke Father   . Heart disease Sister   . Heart disease Sister   . Cancer Sister        liver  . Cancer Brother        lung  . Cancer Brother        lung  . Heart disease Brother    Past Medical History:  Diagnosis Date  . AICD (automatic cardioverter/defibrillator) present 2018   MDT CRT-D.  Fatigue-->completely pacer dependent.  Pacer settings adjusted 12/2017 to allow more chronotrophc variance with ADL's//exertion.  . Arthritis    Pt is s/p eft reverse total shoulder arthroplasty.  [name] BPH (benign prostatic hypertrophy) 06/2011   Irritative sx's; pt declined trial of anticholinergic per Urology records  . Chronic combined systolic and diastolic heart failure ([place]) 05/31/2012   Nonischemic:   EF 40-45%, LA mod-severe dilated, AFIB.   02/2016 EF 40%, diffuse hypokinesis, grade 2 DD.  Myoc perf imaging showed EF 32% 04/2016.  Pt upgraded to CRT-D 01/04/17.  [name] Chronic renal insufficiency, stage III (moderate) (HCC) 2015   CrCl about 60 ml/min  . Complete heart block (HCC)    Has dual chamber pacer.  . Depression   . DOE (dyspnea on exertion)    NYHA class II/III CHF  . Episodic low back pain 01/22/2013   w/intermittent radiculitis (12/2014 his  neurologist referred him to pain mgmt for epidural steroid injection)  . Erectile dysfunction 2019   due to zoloft--urol rx'd viagra  . GERD (gastroesophageal reflux disease)   . H/O tilt table evaluation 11/02/05   negative  . Helicobacter pylori gastritis 01/2016  . High cholesterol   . History of adenomatous polyp of colon 10/12/11   Dr. [name] (3 right side of colon- tubular adenomas removed)  . History of cardiovascular stress test 05/28/12   no ischemia, EF 37%, imaging results are unchanged and within normal variance  . History of chronic prostatitis   . History of kidney stones   . History of vertigo    + Hx of posterior HA's.  Neuro (Dr. Erling Cruz) eval 2011.  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  . Hypertension   . Lumbar spondylosis    lumbosacral radiculopathy at L4 by EMG testing, right foot drop (neurologist is Dr. Linus Salmons with [facility] in [place])--neurologist referred him to neurosurgery  . Migraine    "used to have them all the time; none for years" (01/04/2017)  . Myocardial infarction [facility]) ?1970s   not entirely certain of this  . Nephrolithiasis 07/2012   Left UVJ 2 mm stone with dilation of renal collecting system and slight hydroureter on right  . Neuropathy   . NICM (nonischemic cardiomyopathy) ([place])    a. 02/2018 Cath: LM nl, LAD min irregs, LCX no, RCA 20d. [id number]. Fick CO/CI 4.4/2.0.  . 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  Since no CAD on cath 05/2016, cards recommends upgrade to CRT-D.  [name] Permanent atrial fibrillation    DCCV 07/09/13-converted, lasted two days, then back into afib--needs lifetime anticoagulation (Xarelto as of 09/2014)  . Prostate cancer screening 09/2017   done by urol annually (normal prostate exam documented + PSA 0.84 as of 10/01/17 urol f/u.  [name] Rectus diastasis   . Right ankle sprain 08/2017   w/distal fibula avulsion fx noted on u/s but not plain film-(Dr. Hudnall).  . Skin cancer of arm, left    "burned it off" (01/04/2017)  . TIA (transient ischemic attack)    a. 03/22/2018 following cath. CT head neg.  . Type II diabetes mellitus ([place])    Current Outpatient Medications  Medication Sig Dispense Refill  . acetaminophen (TYLENOL) 500 MG tablet Take 1,000 mg by mouth every 6 (six) hours as needed for moderate pain or headache.    . colchicine 0.6 MG tablet Take 0.6 mg by mouth daily as needed (for gout).     . COMBIGAN 0.2-0.5 % ophthalmic solution Place 1 drop into both eyes at bedtime.     . empagliflozin (JARDIANCE) 10 MG TABS tablet Take 10 mg by mouth daily. 90 tablet 0  . ENTRESTO 97-103 MG TAKE 1 TABLET TWICE A DAY 180 tablet 3  . eplerenone (INSPRA) 25 MG tablet Take 1 tablet (25 mg total) by mouth daily. 90 tablet 3  . gabapentin (NEURONTIN) 300 MG capsule Take 1 capsule (300 mg total) by mouth 3 (three) times daily. 270 capsule 3  . glipiZIDE (GLUCOTROL XL) 10 MG 24 hr tablet TAKE 1 TABLET EVERY MORNING (NEED OFFICE VISIT FOR MORE REFILLS) 90 tablet 1  . glucose blood test strip Use to check blood sugars 1-2 times daily 100 each 3  . Insulin Glargine (LANTUS SOLOSTAR) 100 UNIT/ML Solostar Pen 12 units SQ qhs (Patient taking differently: 17 units  SQ qhs) 15 pen 4  . Insulin Pen Needle (PEN NEEDLES) 31G X 5 MM MISC 1 each by Does not apply route daily. 100 each 5  . Lancets (FREESTYLE) lancets Use as instructed 100  each 6  . latanoprost (XALATAN) 0.005 % ophthalmic solution Place 1 drop into both eyes at bedtime.   12  . meclizine (ANTIVERT) 25 MG tablet Take 1 tablet (25 mg total) 3 (three) times daily as needed by mouth for dizziness. 270 tablet 3  . metoprolol succinate (TOPROL-XL) 50 MG 24 hr tablet Take 1 tablet (50 mg total) by mouth daily. Take with or immediately following a meal. 30 tablet 11  . omeprazole (PRILOSEC) 40 MG capsule TAKE 1 CAPSULE DAILY 90 capsule 4  . oxybutynin (DITROPAN-XL) 5 MG 24 hr tablet Take 1 tablet (5 mg total) by mouth at bedtime. 30 tablet 3  . rOPINIRole (REQUIP) 1 MG tablet TAKE 1 TABLET DAILY 90 tablet 3  . sertraline (ZOLOFT) 100 MG tablet TAKE 1 TABLET DAILY 90 tablet 4  . simvastatin (ZOCOR) 20 MG tablet Take 1 tablet (20 mg total) by mouth every evening. 90 tablet 1  . XARELTO 20 MG TABS tablet TAKE 1 TABLET DAILY WITH SUPPER 90 tablet 1   No current facility-administered medications for this encounter.    Vitals:   08/26/18 1157  BP: 115/80  Pulse: 90  SpO2: 95%  Weight: 105.3 kg (232 lb 3.2 oz)   Wt Readings from Last 3 Encounters:  08/26/18 105.3 kg (232 lb 3.2 oz)  07/30/18 104.8 kg (231 lb)  07/23/18 104.8 kg (231 lb)   PHYSICAL EXAM: General:  Well appearing. No resp difficulty HEENT: normal Neck: supple. no JVD. Carotids 2+ bilat; no bruits. No lymphadenopathy or thryomegaly appreciated. Cor: PMI nondisplaced. Regular rate & rhythm. No rubs, gallops or murmurs. Lungs: clear Abdomen: obese soft, nontender, nondistended. No hepatosplenomegaly. No bruits or masses. Good bowel sounds. Extremities: no cyanosis, clubbing, rash, edema Neuro: alert & oriented x 3, cranial nerves grossly intact. moves all 4 extremities w/o difficulty. Affect pleasant   ASSESSMENT & PLAN:  1. Chronic Systolic heart failure:  - EF dropped after RV pacing.  He has had a CRT-D upgrade 01/04/2017.   - Echo 12/2017 LVEF 35-40% - R/LHC 03/22/18 with normal filling  pressures and moderately depressed CO. Minimal CAD.  - Improved NYHA II symptoms. - Volume status looks good. - Not on diuretics.  - Continue Toprol XL 50 mg daily. Intolerant to uptitration with fatigue.  - Continue entresto 97-103 mg twice a day  - Continue eplerenone 25 mg daily. BMET today - No need for digoxin.  - CPX 03/2018 with minimal limitation from HF and obesity.  - Repeat echo in 6 months - ICD interrogated personally. No VT/AF Volume status looks good. 2.  Permanent atrial fibrillation:  - Rate controlled -This patients CHA2DS2-VASc Score and unadjusted Ischemic Stroke Rate (% per year) is equal to 4.8 % stroke rate/year from a score of 4 - Continue xarelto 20 mg daily. No bleeding.  3. Peri-procedural TIA after heart cath 03/22/2018  - Likely due to embolus to distal branch of RMCA - CT negative - Continue Xarelto and statin.  4. DM2 - Hgb A1C 7.8 05/14/18 - Per PCP.  - Now switched to Jardiance 5. Hypertension:  - Meds as above.  6. CKD 3 - Creatinine baseline 1.2 - 1.4  - Repeat BMET 7. H/o claudication - ABIs normal 10/19  Can f/u with Dr. Bonne Dolores.  Can come back to HF Clinic as needed.   [name], MD  12:22 PM  Patient seen and examined with the above-signed Advanced Practice Provider and/or Housestaff. I personally reviewed laboratory data, imaging studies and relevant notes. I independently examined the patient and formulated the important aspects of the plan. I have edited the note to reflect any of my changes or salient points. I have personally discussed the plan with the patient and/or family.  Doing very well. Stable NYHA II. EF stable at 35-40% with CRT. Recent CPX without significant HF limitation. Volume status stable. Can f/u with Dr. Bonne Dolores. Return to HF Clinic as needed.   [name], MD  12:49 PM

## 2018-08-28 DIAGNOSIS — N481 Balanitis: Secondary | ICD-10-CM | POA: Diagnosis not present

## 2018-08-28 DIAGNOSIS — N471 Phimosis: Secondary | ICD-10-CM | POA: Diagnosis not present

## 2018-08-29 ENCOUNTER — Encounter: Payer: Self-pay | Admitting: *Deleted

## 2018-08-29 ENCOUNTER — Ambulatory Visit (INDEPENDENT_AMBULATORY_CARE_PROVIDER_SITE_OTHER): Payer: Medicare Other | Admitting: Family Medicine

## 2018-08-29 ENCOUNTER — Encounter: Payer: Self-pay | Admitting: Family Medicine

## 2018-08-29 VITALS — BP 122/73 | HR 74 | Temp 98.2°F | Resp 16 | Ht 71.0 in | Wt 230.0 lb

## 2018-08-29 DIAGNOSIS — N183 Chronic kidney disease, stage 3 unspecified: Secondary | ICD-10-CM

## 2018-08-29 DIAGNOSIS — E1149 Type 2 diabetes mellitus with other diabetic neurological complication: Secondary | ICD-10-CM

## 2018-08-29 DIAGNOSIS — I428 Other cardiomyopathies: Secondary | ICD-10-CM | POA: Diagnosis not present

## 2018-08-29 DIAGNOSIS — I10 Essential (primary) hypertension: Secondary | ICD-10-CM

## 2018-08-29 DIAGNOSIS — E7800 Pure hypercholesterolemia, unspecified: Secondary | ICD-10-CM

## 2018-08-29 LAB — POCT GLYCOSYLATED HEMOGLOBIN (HGB A1C)
HbA1c POC (<> result, manual entry): 9 % (ref 4.0–5.6)
HbA1c, POC (controlled diabetic range): 9 % — AB (ref 0.0–7.0)
HbA1c, POC (prediabetic range): 9 % — AB (ref 5.7–6.4)
Hemoglobin A1C: 9 % — AB (ref 4.0–5.6)

## 2018-08-29 MED ORDER — GABAPENTIN 300 MG PO CAPS: ORAL_CAPSULE | ORAL | 3 refills | Status: DC

## 2018-08-29 NOTE — Progress Notes (Signed)
OFFICE VISIT  09/02/2018   CC:  Chief Complaint  Patient presents with  . Follow-up    RCI   HPI:    Patient is a 78 y.o. Caucasian male who presents for 3 mo f/u DM 2, HTN, CRI III, and HLD. He has nonischemic CM (syst and diast dysfxn) and has CRT-D, also chronic a-fib and complete heart block (pacer dependent), and consistently has NYHA class II/III sx's. CHF clinic update 08/26/18-->note reviewed: "-Not on diuretics.  - Continue Toprol XL 50 mg daily. Intolerant to uptitration with fatigue.  - Continue entresto 97-103 mg twice a day  - Continue eplerenone 25 mg daily. BMET today - No need for digoxin.  - CPX 03/2018 with minimal limitation from HF and obesity.  - Repeat echo in 6 months - ICD interrogated: No VT/AF Volume status looks good" Additionally, he was to continue rate control and xarelto for his a-fib.  Electrolytes and Cr checked 3 days ago were stable. He avoids NSAIDs and tries to maintain adequate hydration (w/out getting volume overloaded).  DM: at the time of most recent f/u I increased his lantus to 12 U qhs.  Continued jardiance 10mg  qd and glipizide xl 10mg  qd. Instructions for lantus titration were given. He's giving himself 18 U lantus now. Lowest fasting gluc 185-low 200s.  Highest was 343 the night after the superbowl.  No hypoglycemia. DPN pain: takes 600mg  qAM and 300 qhs.  He asks for increase in nighttime dose to 600mg .  HTN: typically 130s/80s.  HLD: takes simva 20mg  qd.      Past Medical History:  Diagnosis Date  . AICD (automatic cardioverter/defibrillator) present 2018   MDT CRT-D.  Fatigue-->completely pacer dependent.  Pacer settings adjusted 12/2017 to allow more chronotrophc variance with ADL's//exertion.  . Arthritis    Pt is s/p eft reverse total shoulder arthroplasty.  [name] BPH (benign prostatic hypertrophy) 06/2011   Irritative sx's; pt declined trial of anticholinergic per Urology records  . Chronic combined systolic and diastolic  heart failure ([place]) 05/31/2012   Nonischemic:  EF 40-45%, LA mod-severe dilated, AFIB.   02/2016 EF 40%, diffuse hypokinesis, grade 2 DD.  Myoc perf imaging showed EF 32% 04/2016.  Pt upgraded to CRT-D 01/04/17.  [name] Chronic renal insufficiency, stage III (moderate) (HCC) 2015   CrCl about 60 ml/min  . Complete heart block (H[facility])    Has dual chamber pacer.  . Depression   . DOE (dyspnea on exertion)    NYHA class II/III CHF  . Episodic low back pain 01/22/2013   w/intermittent radiculitis (12/2014 his neurologist referred him to pain mgmt for epidural steroid injection)  . Erectile dysfunction 2019   due to zoloft--urol rx'd viagra  . GERD (gastroesophageal reflux disease)   . H/O tilt table evaluation 11/02/05   negative  . Helicobacter pylori gastritis 01/2016  . High cholesterol   . History of adenomatous polyp of colon 10/12/11   Dr. [name] (3 right side of colon- tubular adenomas removed)  . History of cardiovascular stress test 05/28/12   no ischemia, EF 37%, imaging results are unchanged and within normal variance  . History of chronic prostatitis   . History of kidney stones   . History of vertigo    + Hx of posterior HA's.  Neuro (Dr. Erling Cruz) eval 2011.  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  . Hypertension   . Lumbar spondylosis    lumbosacral radiculopathy at L4 by EMG testing,  right foot drop (neurologist is Dr. Linus Salmons with [facility] in [place])--neurologist referred him to neurosurgery  . Migraine    "used to have them all the time; none for years" (01/04/2017)  . Myocardial infarction [facility]) ?1970s   not entirely certain of this  . Nephrolithiasis 07/2012   Left UVJ 2 mm stone with dilation of renal collecting system and slight hydroureter on right  . Neuropathy   . NICM (nonischemic cardiomyopathy) ([place])    a. 02/2018 Cath: LM nl, LAD min irregs, LCX no, RCA 20d. [id number]. Fick CO/CI 4.4/2.0.   . 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  Since no CAD on cath 05/2016, cards recommends upgrade to CRT-D.  [name] Permanent atrial fibrillation    DCCV 07/09/13-converted, lasted two days, then back into afib--needs lifetime anticoagulation (Xarelto as of 09/2014)  . Prostate cancer screening 09/2017   done by urol annually (normal prostate exam documented + PSA 0.84 as of 10/01/17 urol f/u.  [name] Rectus diastasis   . Right ankle sprain 08/2017   w/distal fibula avulsion fx noted on u/s but not plain film-(Dr. Hudnall).  . Skin cancer of arm, left    "burned it off" (01/04/2017)  . TIA (transient ischemic attack)    a. 03/22/2018 following cath. CT head neg.  . Type II diabetes mellitus ([place])     Past Surgical History:  Procedure Laterality Date  . ABI's Bilateral 05/21/2018   normal  . BACK SURGERY    . BIV ICD INSERTION CRT-D N/A 01/04/2017   Procedure: BiV ICD ;  Surgeon: Constance Haw, MD;  Location: [place] CV [facility];  Service: Cardiovascular;  Laterality: N/A;  . CARDIAC CATHETERIZATION N/A 06/14/2016   Minimal nonobstructive dz, EF 25-35%.  Procedure: Left Heart Cath and Coronary Angiography;  Surgeon: Peter M Martinique, MD;  Location: [place] CV [facility];  Service: Cardiovascular;  Laterality: N/A;  . CARDIOVASCULAR STRESS TEST  2012   2012 nuclear perfusion study: low risk scan; 04/2016 normal myocardial perfusion imaging, EF 32%.  [name] CARDIOVERSION  07/09/2012   Procedure: CARDIOVERSION;  Surgeon: [name], MD;  Location: [place];  Service: Cardiovascular;  Laterality: N/A;  . CATARACT EXTRACTION W/ INTRAOCULAR LENS IMPLANT & ANTERIOR VITRECTOMY, BILATERAL Bilateral   . COLONOSCOPY W/ POLYPECTOMY  approx 2006; repeated 09/2011   Polyps on 2013 EGD as well, repeat 12/2014  . ESOPHAGOGASTRODUODENOSCOPY  10/18/06   Done due to chronic GERD: Normal, bx showed no barrett's esophagus (Dr. [name])  . FLEXOR TENDON REPAIR  Left 10/02/2016   Procedure: LEFT RING FINGER WOUND EXPORATION AND FLEXOR TENDON REPAIR AND NERVE REPAIR;  Surgeon: Milly Jakob, MD;  Location: [place];  Service: Orthopedics;  Laterality: Left;  . INSERT / REPLACE / REMOVE PACEMAKER  02/05/2012   dual chamber, sinus node dysfunction, sinus arrest, PAF, Medtronic Revo serial#-[id number] H: last checked 05/2015  . LUMBAR LAMINECTOMY Left 1976   L4-5  . PACEMAKER REMOVAL  01/04/2017  . PERMANENT PACEMAKER INSERTION N/A 02/05/2012   Procedure: PERMANENT PACEMAKER INSERTION;  Surgeon: [name], MD; Generator Medtronic Revo model [place] serial number [id number] H Laterality: N/A;  . RETINAL DETACHMENT SURGERY Left ~ 1999  . REVERSE SHOULDER ARTHROPLASTY Left 2018   Left shoulder reverse [name] in [place]).  [name] RIGHT/LEFT HEART CATH AND CORONARY ANGIOGRAPHY N/A 03/22/2018   EF 30-35%, no CAD.  Procedure: RIGHT/LEFT HEART CATH AND CORONARY ANGIOGRAPHY;  Surgeon: Jolaine Artist, MD;  Location: [place]  CV LAB;  Service: Cardiovascular;  Laterality: N/A;  . TRANSTHORACIC ECHOCARDIOGRAM  08/25/10; 05/2012; 03/23/16;12/2017   mild asymmetric LVH, normal systolic function, normal diastolic fxn, mild-to-mod mitral regurg, mild aortic valve sclerosis and trace AI, mild aortic root dilatation. 2014 f/u showed EF 40-45%, mod LAE, A FIB.  02/2016 EF 40%, diffuse hypokinesis, grade 2 DD. 12/2017 EF 35-40%,diffuse hypokin,grd III DD, mild MR    Outpatient Medications Prior to Visit  Medication Sig Dispense Refill  . acetaminophen (TYLENOL) 500 MG tablet Take 1,000 mg by mouth every 6 (six) hours as needed for moderate pain or headache.    . colchicine 0.6 MG tablet Take 0.6 mg by mouth daily as needed (for gout).     . COMBIGAN 0.2-0.5 % ophthalmic solution Place 1 drop into both eyes at bedtime.     [name] ENTRESTO 97-103 MG TAKE 1 TABLET TWICE A DAY 180 tablet 3  . eplerenone (INSPRA) 25 MG tablet Take 1 tablet (25 mg total) by mouth daily. 90  tablet 3  . glipiZIDE (GLUCOTROL XL) 10 MG 24 hr tablet TAKE 1 TABLET EVERY MORNING (NEED OFFICE VISIT FOR MORE REFILLS) 90 tablet 1  . glucose blood test strip Use to check blood sugars 1-2 times daily 100 each 3  . Insulin Pen Needle (PEN NEEDLES) 31G X 5 MM MISC 1 each by Does not apply route daily. 100 each 5  . Lancets (FREESTYLE) lancets Use as instructed 100 each 6  . latanoprost (XALATAN) 0.005 % ophthalmic solution Place 1 drop into both eyes at bedtime.   12  . meclizine (ANTIVERT) 25 MG tablet Take 1 tablet (25 mg total) 3 (three) times daily as needed by mouth for dizziness. 270 tablet 3  . metoprolol succinate (TOPROL-XL) 50 MG 24 hr tablet Take 1 tablet (50 mg total) by mouth daily. Take with or immediately following a meal. 30 tablet 11  . omeprazole (PRILOSEC) 40 MG capsule TAKE 1 CAPSULE DAILY 90 capsule 4  . oxybutynin (DITROPAN-XL) 5 MG 24 hr tablet Take 1 tablet (5 mg total) by mouth at bedtime. 30 tablet 3  . rOPINIRole (REQUIP) 1 MG tablet TAKE 1 TABLET DAILY 90 tablet 3  . sertraline (ZOLOFT) 100 MG tablet TAKE 1 TABLET DAILY 90 tablet 4  . simvastatin (ZOCOR) 20 MG tablet Take 1 tablet (20 mg total) by mouth every evening. 90 tablet 1  . XARELTO 20 MG TABS tablet TAKE 1 TABLET DAILY WITH SUPPER 90 tablet 1  . empagliflozin (JARDIANCE) 10 MG TABS tablet Take 10 mg by mouth daily. 90 tablet 0  . gabapentin (NEURONTIN) 300 MG capsule Take 1 capsule (300 mg total) by mouth 3 (three) times daily. 270 capsule 3  . Insulin Glargine (LANTUS SOLOSTAR) 100 UNIT/ML Solostar Pen 12 units SQ qhs (Patient taking differently: 17 units SQ qhs) 15 pen 4   No facility-administered medications prior to visit.     No Known Allergies  ROS As per HPI  PE: Blood pressure 122/73, pulse 74, temperature 98.2 F (36.8 C), temperature source Oral, resp. rate 16, height 5\' 11"  (1.803 m), weight 230 lb (104.3 kg), SpO2 97 %. Gen: Alert, well appearing.  Patient is oriented to person, place,  time, and situation. AFFECT: pleasant, lucid thought and speech. CV: RRR (rate 70 or so), no m/r/g.   LUNGS: CTA bilat, nonlabored resps, good aeration in all lung fields. EXT: no clubbing or cyanosis.  no edema.    LABS:  Lab Results  Component Value Date  TSH 2.963 04/04/2018   Lab Results  Component Value Date   WBC 4.7 04/04/2018   HGB 14.4 04/04/2018   HCT 44.4 04/04/2018   MCV 92.1 04/04/2018   PLT 149 (L) 04/04/2018   Lab Results  Component Value Date   CREATININE 1.45 (H) 08/26/2018   BUN 19 08/26/2018   NA 138 08/26/2018   K 4.6 08/26/2018   CL 104 08/26/2018   CO2 23 08/26/2018   Lab Results  Component Value Date   ALT 14 07/27/2017   AST 18 07/27/2017   ALKPHOS 78 07/27/2017   BILITOT 0.5 07/27/2017   Lab Results  Component Value Date   CHOL 137 03/23/2018   Lab Results  Component Value Date   HDL 33 (L) 03/23/2018   Lab Results  Component Value Date   LDLCALC 73 03/23/2018   Lab Results  Component Value Date   TRIG 154 (H) 03/23/2018   Lab Results  Component Value Date   CHOLHDL 4.2 03/23/2018   POC a1c 9%  IMPRESSION AND PLAN:  1) DM 2: Hba1c up to 9% today. Continue to up-titrate Lantus slowly-- Instructions: Increase your lantus insulin to 21 units every night. Check fasting sugar every morning.  If the average sugar level over 3 days is not in 100-110 range, then increase lantus by 1 unit. Continue to increase by 1 unit every 3 days until fasting sugar is consistently in 100-110 range. Continue jardiance and glipizide as usual.  2) HTN: The current medical regimen is effective;  continue present plan and medications. Lytes/cr stable 3 d/a.  3) HLD: Lipids excellent 6 mo ago.  Plan repeat 6 mo.  4) CRI II/III: stable lytes/renal function on labs 3 d/a.  5) Nonischemic CM; meds recently reviewed by CHF clinic MD: "-Not on diuretics.  - Continue Toprol XL 50 mg daily. Intolerant to uptitration with fatigue.  - Continue  entresto 97-103 mg twice a day  - Continue eplerenone 25 mg daily. BMET today - No need for digoxin.  - CPX 03/2018 with minimal limitation from HF and obesity.  - Repeat echo in 6 months - ICD interrogated: No VT/AF Volume status looks good" Additionally, he was to continue rate control and xarelto for his a-fib.  Pt asked that I assume rx'ing responsibilities for his CHF meds.  I agreed to do this but emphasized that I would have cardiology help whenever needed.  An After Visit Summary was printed and given to the patient.  FOLLOW UP: Return in about 3 months (around 11/27/2018) for routine chronic illness f/u.  Signed:  Crissie Sickles, MD           09/02/2018

## 2018-08-29 NOTE — Patient Instructions (Signed)
Increase your lantus insulin to 21 units every night. Check fasting sugar every morning.  If the average sugar level over 3 days is not in 100-110 range, then increase lantus by 1 unit. Continue to increase by 1 unit every 3 days until fasting sugar is consistently in 100-110 range. Continue jardiance and glipizide as usual.

## 2018-09-02 ENCOUNTER — Encounter: Payer: Self-pay | Admitting: Cardiovascular Disease

## 2018-09-02 ENCOUNTER — Ambulatory Visit (INDEPENDENT_AMBULATORY_CARE_PROVIDER_SITE_OTHER): Payer: Medicare Other | Admitting: Cardiovascular Disease

## 2018-09-02 ENCOUNTER — Other Ambulatory Visit: Payer: Self-pay

## 2018-09-02 VITALS — BP 121/72 | HR 68 | Ht 71.0 in | Wt 233.0 lb

## 2018-09-02 DIAGNOSIS — E785 Hyperlipidemia, unspecified: Secondary | ICD-10-CM | POA: Diagnosis not present

## 2018-09-02 DIAGNOSIS — Z95810 Presence of automatic (implantable) cardiac defibrillator: Secondary | ICD-10-CM

## 2018-09-02 DIAGNOSIS — I10 Essential (primary) hypertension: Secondary | ICD-10-CM | POA: Diagnosis not present

## 2018-09-02 DIAGNOSIS — E1169 Type 2 diabetes mellitus with other specified complication: Secondary | ICD-10-CM

## 2018-09-02 DIAGNOSIS — I5022 Chronic systolic (congestive) heart failure: Secondary | ICD-10-CM

## 2018-09-02 DIAGNOSIS — I4821 Permanent atrial fibrillation: Secondary | ICD-10-CM | POA: Diagnosis not present

## 2018-09-02 DIAGNOSIS — E669 Obesity, unspecified: Secondary | ICD-10-CM

## 2018-09-02 DIAGNOSIS — Z7901 Long term (current) use of anticoagulants: Secondary | ICD-10-CM | POA: Diagnosis not present

## 2018-09-02 DIAGNOSIS — I442 Atrioventricular block, complete: Secondary | ICD-10-CM | POA: Diagnosis not present

## 2018-09-02 DIAGNOSIS — I739 Peripheral vascular disease, unspecified: Secondary | ICD-10-CM | POA: Diagnosis not present

## 2018-09-02 NOTE — Patient Instructions (Signed)
Medication Instructions:  The current medical regimen is effective;  continue present plan and medications.  If you need a refill on your cardiac medications before your next appointment, please call your pharmacy.    Follow-Up: At [facility], you and your health needs are our priority.  As part of our continuing mission to provide you with exceptional heart care, we have created designated Provider Care Teams.  These Care Teams include your primary Cardiologist (physician) and Advanced Practice Providers (APPs -  Physician Assistants and Nurse Practitioners) who all work together to provide you with the care you need, when you need it. You will need a follow up appointment in 12 months.  Please call our office 2 months in advance to schedule this appointment.  You may see Mihai Croitoru, MD or one of the following Advanced Practice Providers on your designated Care Team: Hao Meng, PA-C . Angela Duke, PA-C    

## 2018-09-02 NOTE — Progress Notes (Signed)
`   Cardiology Office Note    Date:  09/03/2018   ID:  Mason [name], DOB [birth year], MRN [id number]  PCP:  Tammi Sou, MD  Cardiologist:   [name], MD   No chief complaint on file.   History of Present Illness:  Mason [name] is a 78 y.o. male with nonischemic cardiomyopathy, permanent atrial fibrillation and complete heart block who underwent implantation of a CRT-D in June 2018 for deteriorating left ventricular systolic function.  He is pacemaker dependent.  Although he still has complaints of fatigue, overall Mason [name] is doing quite well after the last changes were made in the sensor settings and after we decreased his beta-blocker dose.Mason [name]  He feels that he has improved since his last appointment.  He denies exertional dyspnea, orthopnea, PND, angina at rest or with activity, dizziness, syncope, palpitations, focal neurological events, leg edema or claudication.  His pacemaker shows an average of 2.5 hours of activity most weeks, 1.9 hours during the last week.  His device is a Medtronic Claria CRT-D device with an estimated longevity of another 6.1 years.  He has 99.9% biventricular pacing.  His OptiVol is very stable.  Left ventricular pacing is in the LV 3-RV coil configuration with a threshold 0.375 V at 1.0 ms.  There was no escape rhythm detected today when the pacing was taken down to 30 bpm.  He is pacemaker dependent.  He has not had any ventricular tachycardia, either sustained or nonsustained.  He is in permanent atrial fibrillation.  Interrogation of his pacemaker shows adequate heart rate histogram distribution.  It is hard to support a diagnosis of chronotropic incompetence after the last sensor rate control settings were adjusted.  Activity is constant at about 2 hours/day.  Thoracic impedance is in normal range without evidence of volume overload.  He has 100% biventricular pacing.  Lead parameters are excellent.  Estimated generator longevity 6.4 years.  There has been  no evidence of ventricular tachycardia.  Switched from spironolactone to eplerenone due to gynecomastia.  It is probably too soon to expect a change.  He has had no bleeding problems and is compliant with Xarelto anticoagulation.  He does not have coronary or other vascular disorders, but has numerous risk factors including type 2 diabetes, hyperlipidemia, mild obesity. He has a history of gout.  He is now established as 100% service connected at the [facility] and this should help with his medications, some of which are quite expensive (Xarelto, Jardiance, eplerenone, Lantus).  The cause of Mason [name]'s cardiomyopathy remains uncertain.  He initially presented with a "heart attack" and was in the ICU in the [place] [facility] for 3 to 4 days in 1976.  He has had a depressed left ventricular systolic function for many years.  Cardiac catheterization most recently performed in 2019 does show nonobstructive atherosclerosis.Mason [name]  He has a Water engineer including 2 stints in [place] and he knows that he was exposed to [employer].  He underwent right and left heart catheterization for optimization of heart failure therapy on March 22, 2018.  Right atrial pressure was 7 mmHg, wedge pressure was 13 and PA pressure was 30/10 (mean 22) millimeters Hg.  The cardiac index was 2.0.  The coronary arteries had only minimal atherosclerosis.  Radial artery access was difficult.  On the catheterization he developed transient weakness of the left upper extremity and left facial droop, from which he recovered fully thankfully.  His dose of beta-blocker was reduced.  He is not on diuretics.  He subsequently underwent cardiopulmonary stress testing on September 18 and did quite well with a mildly reduced peak VO2 of 16.8 mL/kg/min (79% of control).  There was still evidence of drug-induced chronotropic incompetence with a maximum heart rate only reached 74% of predicted.     Past Medical History:  Diagnosis  Date  . AICD (automatic cardioverter/defibrillator) present 2018   MDT CRT-D.  Fatigue-->completely pacer dependent.  Pacer settings adjusted 12/2017 to allow more chronotrophc variance with ADL's//exertion.  . Arthritis    Pt is s/p eft reverse total shoulder arthroplasty.  Mason [name] BPH (benign prostatic hypertrophy) 06/2011   Irritative sx's; pt declined trial of anticholinergic per Urology records  . Chronic combined systolic and diastolic heart failure ([place]) 05/31/2012   Nonischemic:  EF 40-45%, LA mod-severe dilated, AFIB.   02/2016 EF 40%, diffuse hypokinesis, grade 2 DD.  Myoc perf imaging showed EF 32% 04/2016.  Pt upgraded to CRT-D 01/04/17.  Mason [name] Chronic renal insufficiency, stage III (moderate) (HCC) 2015   CrCl about 60 ml/min  . Complete heart block (HCC)    Has dual chamber pacer.  . Depression   . DOE (dyspnea on exertion)    NYHA class II/III CHF  . Episodic low back pain 01/22/2013   w/intermittent radiculitis (12/2014 his neurologist referred him to pain mgmt for epidural steroid injection)  . Erectile dysfunction 2019   due to zoloft--urol rx'd viagra  . GERD (gastroesophageal reflux disease)   . H/O tilt table evaluation 11/02/05   negative  . Helicobacter pylori gastritis 01/2016  . High cholesterol   . History of adenomatous polyp of colon 10/12/11   Dr. [name] (3 right side of colon- tubular adenomas removed)  . History of cardiovascular stress test 05/28/12   no ischemia, EF 37%, imaging results are unchanged and within normal variance  . History of chronic prostatitis   . History of kidney stones   . History of vertigo    + Hx of posterior HA's.  Neuro (Dr. Erling Cruz) eval 2011.  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  . Hypertension   . Lumbar spondylosis    lumbosacral radiculopathy at L4 by EMG testing, right foot drop (neurologist is Dr. Linus Salmons with [facility] in [place])--neurologist  referred him to neurosurgery  . Migraine    "used to have them all the time; none for years" (01/04/2017)  . Myocardial infarction [facility]) ?1970s   not entirely certain of this  . Nephrolithiasis 07/2012   Left UVJ 2 mm stone with dilation of renal collecting system and slight hydroureter on right  . Neuropathy   . NICM (nonischemic cardiomyopathy) ([place])    a. 02/2018 Cath: LM nl, LAD min irregs, LCX no, RCA 20d. [id number]. Fick CO/CI 4.4/2.0.  . 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  Since no CAD on cath 05/2016, cards recommends upgrade to CRT-D.  Mason [name] Permanent atrial fibrillation    DCCV 07/09/13-converted, lasted two days, then back into afib--needs lifetime anticoagulation (Xarelto as of 09/2014)  . Prostate cancer screening 09/2017   done by urol annually (normal prostate exam documented + PSA 0.84 as of 10/01/17 urol f/u.  Mason [name] Rectus diastasis   . Right ankle sprain 08/2017   w/distal fibula avulsion fx noted on u/s but not plain film-(Dr. Hudnall).  . Skin cancer of arm, left    "burned it off" (01/04/2017)  . TIA (  transient ischemic attack)    a. 03/22/2018 following cath. CT head neg.  . Type II diabetes mellitus ([place])     Past Surgical History:  Procedure Laterality Date  . ABI's Bilateral 05/21/2018   normal  . BACK SURGERY    . BIV ICD INSERTION CRT-D N/A 01/04/2017   Procedure: BiV ICD ;  Surgeon: Constance Haw, MD;  Location: [place] [facility];  Service: Cardiovascular;  Laterality: N/A;  . CARDIAC CATHETERIZATION N/A 06/14/2016   Minimal nonobstructive dz, EF 25-35%.  Procedure: Left Heart Cath and Coronary Angiography;  Surgeon: Peter M Martinique, MD;  Location: [place] [facility];  Service: Cardiovascular;  Laterality: N/A;  . CARDIOVASCULAR STRESS TEST  2012   2012 nuclear perfusion study: low risk scan; 04/2016 normal myocardial perfusion imaging, EF 32%.  Mason [name] CARDIOVERSION  07/09/2012   Procedure: CARDIOVERSION;   Surgeon: [name], MD;  Location: [place];  Service: Cardiovascular;  Laterality: N/A;  . CATARACT EXTRACTION W/ INTRAOCULAR LENS IMPLANT & ANTERIOR VITRECTOMY, BILATERAL Bilateral   . COLONOSCOPY W/ POLYPECTOMY  approx 2006; repeated 09/2011   Polyps on 2013 EGD as well, repeat 12/2014  . ESOPHAGOGASTRODUODENOSCOPY  10/18/06   Done due to chronic GERD: Normal, bx showed no barrett's esophagus (Dr. [name])  . FLEXOR TENDON REPAIR Left 10/02/2016   Procedure: LEFT RING FINGER WOUND EXPORATION AND FLEXOR TENDON REPAIR AND NERVE REPAIR;  Surgeon: Milly Jakob, MD;  Location: [place];  Service: Orthopedics;  Laterality: Left;  . INSERT / REPLACE / REMOVE PACEMAKER  02/05/2012   dual chamber, sinus node dysfunction, sinus arrest, PAF, Medtronic Revo serial#-[id number] H: last checked 05/2015  . LUMBAR LAMINECTOMY Left 1976   L4-5  . PACEMAKER REMOVAL  01/04/2017  . PERMANENT PACEMAKER INSERTION N/A 02/05/2012   Procedure: PERMANENT PACEMAKER INSERTION;  Surgeon: [name], MD; Generator Medtronic Revo model [place] serial number [id number] H Laterality: N/A;  . RETINAL DETACHMENT SURGERY Left ~ 1999  . REVERSE SHOULDER ARTHROPLASTY Left 2018   Left shoulder reverse [name] in [place]  Mason [name] RIGHT/LEFT HEART CATH AND CORONARY ANGIOGRAPHY N/A 03/22/2018   EF 30-35%, no CAD.  Procedure: RIGHT/LEFT HEART CATH AND CORONARY ANGIOGRAPHY;  Surgeon: Jolaine Artist, MD;  Location: [place] [facility];  Service: Cardiovascular;  Laterality: N/A;  . TRANSTHORACIC ECHOCARDIOGRAM  08/25/10; 05/2012; 03/23/16;12/2017   mild asymmetric LVH, normal systolic function, normal diastolic fxn, mild-to-mod mitral regurg, mild aortic valve sclerosis and trace AI, mild aortic root dilatation. 2014 f/u showed EF 40-45%, mod LAE, A FIB.  02/2016 EF 40%, diffuse hypokinesis, grade 2 DD. 12/2017 EF 35-40%,diffuse hypokin,grd III DD, mild MR    Current Medications: Outpatient Medications Prior to Visit    Medication Sig Dispense Refill  . acetaminophen (TYLENOL) 500 MG tablet Take 1,000 mg by mouth every 6 (six) hours as needed for moderate pain or headache.    . colchicine 0.6 MG tablet Take 0.6 mg by mouth daily as needed (for gout).     . COMBIGAN 0.2-0.5 % ophthalmic solution Place 1 drop into both eyes at bedtime.     Mason [name] ENTRESTO 97-103 MG TAKE 1 TABLET TWICE A DAY 180 tablet 3  . eplerenone (INSPRA) 25 MG tablet Take 1 tablet (25 mg total) by mouth daily. 90 tablet 3  . gabapentin (NEURONTIN) 300 MG capsule 2 caps po bid 360 capsule 3  . glipiZIDE (GLUCOTROL XL) 10 MG 24 hr tablet TAKE 1 TABLET EVERY MORNING (NEED OFFICE VISIT FOR MORE REFILLS) 90 tablet  1  . glucose blood test strip Use to check blood sugars 1-2 times daily 100 each 3  . insulin glargine (LANTUS) 100 UNIT/ML injection Inject into the skin daily. Fluctuating dose    . Insulin Pen Needle (PEN NEEDLES) 31G X 5 MM MISC 1 each by Does not apply route daily. 100 each 5  . JARDIANCE 10 MG TABS tablet TAKE 1 TABLET DAILY 90 tablet 1  . Lancets (FREESTYLE) lancets Use as instructed 100 each 6  . latanoprost (XALATAN) 0.005 % ophthalmic solution Place 1 drop into both eyes at bedtime.   12  . meclizine (ANTIVERT) 25 MG tablet Take 1 tablet (25 mg total) 3 (three) times daily as needed by mouth for dizziness. 270 tablet 3  . metoprolol succinate (TOPROL-XL) 50 MG 24 hr tablet Take 1 tablet (50 mg total) by mouth daily. Take with or immediately following a meal. 30 tablet 11  . omeprazole (PRILOSEC) 40 MG capsule TAKE 1 CAPSULE DAILY 90 capsule 4  . oxybutynin (DITROPAN-XL) 5 MG 24 hr tablet Take 1 tablet (5 mg total) by mouth at bedtime. 30 tablet 3  . rOPINIRole (REQUIP) 1 MG tablet TAKE 1 TABLET DAILY 90 tablet 3  . sertraline (ZOLOFT) 100 MG tablet TAKE 1 TABLET DAILY 90 tablet 4  . simvastatin (ZOCOR) 20 MG tablet Take 1 tablet (20 mg total) by mouth every evening. 90 tablet 1  . XARELTO 20 MG TABS tablet TAKE 1 TABLET DAILY  WITH SUPPER 90 tablet 1  . Insulin Glargine (LANTUS SOLOSTAR) 100 UNIT/ML Solostar Pen 12 units SQ qhs (Patient taking differently: 17 units SQ qhs) 15 pen 4   No facility-administered medications prior to visit.      Allergies:   Patient has no known allergies.   Social History   Socioeconomic History  . Marital status: Married    Spouse name: Not on file  . Number of children: Not on file  . Years of education: Not on file  . Highest education level: Not on file  Occupational History  . Not on file  Social Needs  . Financial resource strain: Not on file  . Food insecurity:    Worry: Not on file    Inability: Not on file  . Transportation needs:    Medical: Not on file    Non-medical: Not on file  Tobacco Use  . Smoking status: Never Smoker  . Smokeless tobacco: Never Used  Substance and Sexual Activity  . Alcohol use: Yes    Comment: occ  . Drug use: No  . Sexual activity: Not on file  Lifestyle  . Physical activity:    Days per week: Not on file    Minutes per session: Not on file  . Stress: Not on file  Relationships  . Social connections:    Talks on phone: Not on file    Gets together: Not on file    Attends religious service: Not on file    Active member of club or organization: Not on file    Attends meetings of clubs or organizations: Not on file    Relationship status: Not on file  Other Topics Concern  . Not on file  Social History Narrative  . Not on file     Family History:  The patient's family history includes Cancer in his brother, brother, and sister; Heart disease in his brother, sister, and sister; Heart failure in his mother; Stroke in his father and mother.   ROS:  Please see the history of present illness.    ROS all other systems are reviewed and are negative   PHYSICAL EXAM:   VS:  BP 121/72   Pulse 68   Ht 5\' 11"  (1.803 m)   Wt 233 lb (105.7 kg)   SpO2 93%   BMI 32.50 kg/m      General: Alert, oriented x3, no distress,  healthy left subclavian pacemaker site Head: no evidence of trauma, PERRL, EOMI, no exophtalmos or lid lag, no myxedema, no xanthelasma; normal ears, nose and oropharynx Neck: normal jugular venous pulsations and no hepatojugular reflux; brisk carotid pulses without delay and no carotid bruits Chest: clear to auscultation, no signs of consolidation by percussion or palpation, normal fremitus, symmetrical and full respiratory excursions Cardiovascular: normal position and quality of the apical impulse, regular rhythm, normal first and paradoxically split second heart sounds, no murmurs, rubs or gallops Abdomen: no tenderness or distention, no masses by palpation, no abnormal pulsatility or arterial bruits, normal bowel sounds, no hepatosplenomegaly Extremities: no clubbing, cyanosis or edema; 2+ radial, ulnar and brachial pulses bilaterally; 2+ right femoral, posterior tibial and dorsalis pedis pulses; 2+ left femoral, posterior tibial and dorsalis pedis pulses; no subclavian or femoral bruits Neurological: grossly nonfocal Psych: Normal mood and affect    Wt Readings from Last 3 Encounters:  09/02/18 233 lb (105.7 kg)  08/29/18 230 lb (104.3 kg)  08/26/18 232 lb 3.2 oz (105.3 kg)     Studies/Labs Reviewed:   EKG:  EKG is ordered today.  Atrial fibrillation with ventricular paced rhythm and very prominent positive R waves in leads V1-V2 but with a broad QRS 102 ms and correspondingly prolonged QTC 522 ms.  Recent Labs: 04/04/2018: Hemoglobin 14.4; Platelets 149; TSH 2.963 08/26/2018: B Natriuretic Peptide 176.7; BUN 19; Creatinine, Ser 1.45; Potassium 4.6; Sodium 138   Lipid Panel    Component Value Date/Time   CHOL 137 03/23/2018 0500   TRIG 154 (H) 03/23/2018 0500   HDL 33 (L) 03/23/2018 0500   CHOLHDL 4.2 03/23/2018 0500   VLDL 31 03/23/2018 0500   LDLCALC 73 03/23/2018 0500     ASSESSMENT:    1. Chronic systolic (congestive) heart failure ([place])   2. Complete heart block (HCC)    3. Permanent atrial fibrillation   4. Long term (current) use of anticoagulants   5. Biventricular automatic implantable cardioverter defibrillator in situ   6. Essential hypertension   7. PAD (peripheral artery disease) ([place])   8. Diabetes mellitus type 2 in obese ([place])   9. Dyslipidemia (high LDL; low HDL)      PLAN:  In order of problems listed above:  1. CHF: On max dose Entresto and felt better after we reduced his dose of beta-blocker.  On eplerenone (due to gynecomastia).  At least based on heart rate histogram distribution he has appropriate sensor settings.  He has NYHA functional class I-2 and appears euvolemic both clinically and by OptiVol.  At this point I do not think we need to do another cardiopulmonary stress test.   2. CHB: Pacemaker dependent, no underlying escape rhythm 3. AFib: Permanent arrhythmia, appropriately anticoagulated. CHADSVasc 5 (age 89, diabetes, heart failure, hypertension). 4. Xarelto: Well-tolerated without bleeding problems. 5. CRT-D: Normal device function.  Based on heart rate histogram distribution his sensor settings are now appropriate. 6. HTN: Excellent control. 7. PAD: Very mild abnormality in the left lower extremity ABI, currently asymptomatic. 8. DM/obesity/dyslipidemia: low HDL (encoraged weight loss), otherwise metabolic parameters are in  desirable range.    Medication Adjustments/Labs and Tests Ordered: Current medicines are reviewed at length with the patient today.  Concerns regarding medicines are outlined above.  Medication changes, Labs and Tests ordered today are listed in the Patient Instructions below. Patient Instructions  Medication Instructions:  The current medical regimen is effective;  continue present plan and medications.  If you need a refill on your cardiac medications before your next appointment, please call your pharmacy.    Follow-Up: At [facility], you and your health needs are our priority.  As part of  our continuing mission to provide you with exceptional heart care, we have created designated Provider Care Teams.  These Care Teams include your primary Cardiologist (physician) and Advanced Practice Providers (APPs -  Physician Assistants and Nurse Practitioners) who all work together to provide you with the care you need, when you need it. You will need a follow up appointment in 12 months.  Please call our office 2 months in advance to schedule this appointment.  You may see [name], MD or one of the following Advanced Practice Providers on your designated Care Team: [place], [place] . Fabian Sharp, PA-C        Signed, [name], MD  09/03/2018 2:04 PM    [facility] [address], [place], [place]  [address] Phone: [phone]; Fax: [phone]

## 2018-09-03 DIAGNOSIS — Z7901 Long term (current) use of anticoagulants: Secondary | ICD-10-CM | POA: Insufficient documentation

## 2018-09-03 NOTE — Addendum Note (Signed)
Addended by: Crissie Reese on: 09/03/2018 03:02 PM   Modules accepted: Orders

## 2018-09-10 LAB — CUP PACEART INCLINIC DEVICE CHECK
Date Time Interrogation Session: 20200218151322
Implantable Lead Implant Date: 20130715
Implantable Lead Implant Date: 20180614
Implantable Lead Implant Date: 20180614
Implantable Lead Location: 753858
Implantable Lead Location: 753859
Implantable Lead Location: 753860
Implantable Lead Model: 4598
Implantable Pulse Generator Implant Date: 20180614

## 2018-09-13 ENCOUNTER — Encounter: Payer: Self-pay | Admitting: Family Medicine

## 2018-09-30 DIAGNOSIS — R972 Elevated prostate specific antigen [PSA]: Secondary | ICD-10-CM | POA: Diagnosis not present

## 2018-10-02 ENCOUNTER — Other Ambulatory Visit: Payer: Self-pay | Admitting: Family Medicine

## 2018-10-02 NOTE — Telephone Encounter (Signed)
RF request for meclizine LOV: 08/29/18 Next ov: 11/27/18 Last written: 06/11/17 #270 w/ 3RF  Please advise. Thanks.

## 2018-10-03 DIAGNOSIS — H1045 Other chronic allergic conjunctivitis: Secondary | ICD-10-CM | POA: Diagnosis not present

## 2018-10-03 DIAGNOSIS — H401123 Primary open-angle glaucoma, left eye, severe stage: Secondary | ICD-10-CM | POA: Diagnosis not present

## 2018-10-03 DIAGNOSIS — H401112 Primary open-angle glaucoma, right eye, moderate stage: Secondary | ICD-10-CM | POA: Diagnosis not present

## 2018-10-03 DIAGNOSIS — H04123 Dry eye syndrome of bilateral lacrimal glands: Secondary | ICD-10-CM | POA: Diagnosis not present

## 2018-10-03 DIAGNOSIS — Z961 Presence of intraocular lens: Secondary | ICD-10-CM | POA: Diagnosis not present

## 2018-10-03 DIAGNOSIS — H0102B Squamous blepharitis left eye, upper and lower eyelids: Secondary | ICD-10-CM | POA: Diagnosis not present

## 2018-10-03 DIAGNOSIS — H0102A Squamous blepharitis right eye, upper and lower eyelids: Secondary | ICD-10-CM | POA: Diagnosis not present

## 2018-10-07 ENCOUNTER — Other Ambulatory Visit: Payer: Self-pay | Admitting: *Deleted

## 2018-10-07 MED ORDER — OXYBUTYNIN CHLORIDE ER 5 MG PO TB24: 5.0000 mg | ORAL_TABLET | Freq: Every day | ORAL | 1 refills | Status: DC

## 2018-10-07 MED ORDER — METOPROLOL SUCCINATE ER 50 MG PO TB24: 50.0000 mg | ORAL_TABLET | Freq: Every day | ORAL | 1 refills | Status: DC

## 2018-10-17 ENCOUNTER — Other Ambulatory Visit: Payer: Self-pay | Admitting: Cardiovascular Disease

## 2018-10-22 ENCOUNTER — Ambulatory Visit (INDEPENDENT_AMBULATORY_CARE_PROVIDER_SITE_OTHER): Payer: Medicare Other | Admitting: *Deleted

## 2018-10-22 ENCOUNTER — Other Ambulatory Visit: Payer: Self-pay

## 2018-10-22 DIAGNOSIS — I5022 Chronic systolic (congestive) heart failure: Secondary | ICD-10-CM

## 2018-10-22 DIAGNOSIS — I442 Atrioventricular block, complete: Secondary | ICD-10-CM

## 2018-10-23 ENCOUNTER — Telehealth: Payer: Self-pay

## 2018-10-23 DIAGNOSIS — N400 Enlarged prostate without lower urinary tract symptoms: Secondary | ICD-10-CM | POA: Diagnosis not present

## 2018-10-23 DIAGNOSIS — N481 Balanitis: Secondary | ICD-10-CM | POA: Diagnosis not present

## 2018-10-23 LAB — CUP PACEART REMOTE DEVICE CHECK
Battery Remaining Longevity: 71 mo
Battery Voltage: 2.98 V
Brady Statistic AP VP Percent: 0 %
Brady Statistic AP VS Percent: 0 %
Brady Statistic AS VP Percent: 0 %
Brady Statistic AS VS Percent: 0 %
Brady Statistic RA Percent Paced: 0 %
Brady Statistic RV Percent Paced: 99.89 %
Date Time Interrogation Session: 20200401161502
HighPow Impedance: 80 Ohm
Implantable Lead Implant Date: 20130715
Implantable Lead Implant Date: 20180614
Implantable Lead Implant Date: 20180614
Implantable Lead Location: 753858
Implantable Lead Location: 753859
Implantable Lead Location: 753860
Implantable Lead Model: 4598
Implantable Pulse Generator Implant Date: 20180614
Lead Channel Impedance Value: 193.707
Lead Channel Impedance Value: 193.707
Lead Channel Impedance Value: 201.488
Lead Channel Impedance Value: 209 Ohm
Lead Channel Impedance Value: 218.087
Lead Channel Impedance Value: 361 Ohm
Lead Channel Impedance Value: 361 Ohm
Lead Channel Impedance Value: 418 Ohm
Lead Channel Impedance Value: 418 Ohm
Lead Channel Impedance Value: 456 Ohm
Lead Channel Impedance Value: 513 Ohm
Lead Channel Impedance Value: 532 Ohm
Lead Channel Impedance Value: 551 Ohm
Lead Channel Impedance Value: 665 Ohm
Lead Channel Impedance Value: 703 Ohm
Lead Channel Impedance Value: 722 Ohm
Lead Channel Impedance Value: 760 Ohm
Lead Channel Impedance Value: 779 Ohm
Lead Channel Pacing Threshold Amplitude: 0.375 V
Lead Channel Pacing Threshold Amplitude: 0.625 V
Lead Channel Pacing Threshold Pulse Width: 0.4 ms
Lead Channel Pacing Threshold Pulse Width: 1 ms
Lead Channel Sensing Intrinsic Amplitude: 0.625 mV
Lead Channel Sensing Intrinsic Amplitude: 20.5 mV
Lead Channel Sensing Intrinsic Amplitude: 20.5 mV
Lead Channel Setting Pacing Amplitude: 1 V
Lead Channel Setting Pacing Amplitude: 2 V
Lead Channel Setting Pacing Pulse Width: 0.4 ms
Lead Channel Setting Pacing Pulse Width: 1 ms
Lead Channel Setting Sensing Sensitivity: 0.3 mV

## 2018-10-23 NOTE — Telephone Encounter (Signed)
Spoke with patient to remind of missed remote transmission 

## 2018-10-28 ENCOUNTER — Telehealth: Payer: Self-pay | Admitting: Family Medicine

## 2018-10-28 NOTE — Telephone Encounter (Signed)
This is a medication for his glaucoma. Needs to request RF from his eye MD.-thx

## 2018-10-28 NOTE — Telephone Encounter (Signed)
Copied from [place] [id number]. Topic: General - Other >> Oct 28, 2018 10:13 AM Lennox Solders wrote: Reason for CRM:pt is calling and needs a refill on [facility]rost [facility]

## 2018-10-28 NOTE — Telephone Encounter (Signed)
Patient requesting rf of latanoprost ophthalmic solution. Last refill shows 2016.  Does not look like this was ever filled by Dr Anitra Lauth.  Please advise.

## 2018-10-29 NOTE — Telephone Encounter (Signed)
LMTCB regarding med refill.

## 2018-10-30 ENCOUNTER — Encounter: Payer: Self-pay | Admitting: Cardiology

## 2018-10-30 NOTE — Progress Notes (Signed)
Remote ICD transmission.   

## 2018-11-07 ENCOUNTER — Encounter: Payer: Self-pay | Admitting: Family Medicine

## 2018-11-14 DIAGNOSIS — K439 Ventral hernia without obstruction or gangrene: Secondary | ICD-10-CM | POA: Diagnosis not present

## 2018-11-14 DIAGNOSIS — K219 Gastro-esophageal reflux disease without esophagitis: Secondary | ICD-10-CM | POA: Diagnosis not present

## 2018-11-14 DIAGNOSIS — R1013 Epigastric pain: Secondary | ICD-10-CM | POA: Diagnosis not present

## 2018-11-26 DIAGNOSIS — H4312 Vitreous hemorrhage, left eye: Secondary | ICD-10-CM | POA: Diagnosis not present

## 2018-11-26 DIAGNOSIS — H43811 Vitreous degeneration, right eye: Secondary | ICD-10-CM | POA: Diagnosis not present

## 2018-11-26 DIAGNOSIS — Z961 Presence of intraocular lens: Secondary | ICD-10-CM | POA: Diagnosis not present

## 2018-11-26 DIAGNOSIS — H0102A Squamous blepharitis right eye, upper and lower eyelids: Secondary | ICD-10-CM | POA: Diagnosis not present

## 2018-11-26 DIAGNOSIS — H401112 Primary open-angle glaucoma, right eye, moderate stage: Secondary | ICD-10-CM | POA: Diagnosis not present

## 2018-11-26 DIAGNOSIS — Z8669 Personal history of other diseases of the nervous system and sense organs: Secondary | ICD-10-CM | POA: Diagnosis not present

## 2018-11-26 DIAGNOSIS — H401123 Primary open-angle glaucoma, left eye, severe stage: Secondary | ICD-10-CM | POA: Diagnosis not present

## 2018-11-26 DIAGNOSIS — H0102B Squamous blepharitis left eye, upper and lower eyelids: Secondary | ICD-10-CM | POA: Diagnosis not present

## 2018-11-26 DIAGNOSIS — H35372 Puckering of macula, left eye: Secondary | ICD-10-CM | POA: Diagnosis not present

## 2018-11-27 ENCOUNTER — Encounter: Payer: Self-pay | Admitting: Family Medicine

## 2018-11-27 ENCOUNTER — Ambulatory Visit (INDEPENDENT_AMBULATORY_CARE_PROVIDER_SITE_OTHER): Payer: Medicare Other | Admitting: Family Medicine

## 2018-11-27 ENCOUNTER — Other Ambulatory Visit: Payer: Self-pay

## 2018-11-27 DIAGNOSIS — I5022 Chronic systolic (congestive) heart failure: Secondary | ICD-10-CM

## 2018-11-27 DIAGNOSIS — Z794 Long term (current) use of insulin: Secondary | ICD-10-CM | POA: Diagnosis not present

## 2018-11-27 DIAGNOSIS — E7800 Pure hypercholesterolemia, unspecified: Secondary | ICD-10-CM | POA: Diagnosis not present

## 2018-11-27 DIAGNOSIS — Z7901 Long term (current) use of anticoagulants: Secondary | ICD-10-CM | POA: Diagnosis not present

## 2018-11-27 DIAGNOSIS — N183 Chronic kidney disease, stage 3 unspecified: Secondary | ICD-10-CM

## 2018-11-27 DIAGNOSIS — E11319 Type 2 diabetes mellitus with unspecified diabetic retinopathy without macular edema: Secondary | ICD-10-CM | POA: Diagnosis not present

## 2018-11-27 DIAGNOSIS — I10 Essential (primary) hypertension: Secondary | ICD-10-CM | POA: Diagnosis not present

## 2018-11-27 NOTE — Progress Notes (Signed)
Virtual Visit via Video Note  I connected with Mason [name] on 11/27/18 at 10:00 AM EDT by a video enabled telemedicine application and verified that I am speaking with the correct person using two identifiers.  Location patient: home Location provider:work or home office Persons participating in the virtual visit: patient, provider  I discussed the limitations of evaluation and management by telemedicine and the availability of in person appointments. The patient expressed understanding and agreed to proceed.  Telemedicine visit is a necessity given the COVID-19 restrictions in place at the current time.  HPI: 78 y/o WM being seen today for 3 mo f/u DM 2, HTN, CRI III. He has nonischemic CM (syst and diast dysfxn) and has CRT-D, also chronic a-fib and complete heart block (pacer dependent), and consistently has NYHA class II/III sx's. Most recent CHF clinic visit was 09/02/18:  All was stable, no changes made.    DM: last visit with me his a1c was up to 9%. I recommended he increase his lantus to 21 U qhs and up titrate lantus every 3d to get fasting glucose in 100-110 range.  Jardiance and glipizide dosing did not change. Seeing Dr. Zadie Rhine recently for retinal hemorrhage in L eye. Taking 24 U qhs, has not increased as recommended b/c scared to w/out talking with me first, yet he did not call to ask me anything in between last visit and now. Admits to eating carb rich foods late at night some nights.  This causes some morning glucoses to be significantly higher than other times-->possibly leading to confusion about whether or not to increase his dose of insulin.  HTN: occ bp check at another MD visit normal but no home bp checks.  CRI III: avoids NSAIDs.  Hydration sounds fair.  HLD: tolerating statin.  ROS: no CP, no SOB, no wheezing, no cough, no dizziness, no HAs, no rashes, no melena/hematochezia.  No polyuria or polydipsia.  No myalgias or arthralgias.  Past Medical History:  Diagnosis  Date  . AICD (automatic cardioverter/defibrillator) present 2018   MDT CRT-D.  Fatigue-->completely pacer dependent.  Pacer settings adjusted 12/2017 to allow more chronotrophc variance with ADL's//exertion.  . Arthritis    Mason [name] is s/p eft reverse total shoulder arthroplasty.  . Balanitis    chronic fungal  . BPH (benign prostatic hypertrophy) 06/2011   Irritative sx's; Mason [name] declined trial of anticholinergic per Urology records  . Chronic combined systolic and diastolic heart failure ([place]) 05/31/2012   Nonischemic:  EF 40-45%, LA mod-severe dilated, AFIB.   02/2016 EF 40%, diffuse hypokinesis, grade 2 DD.  Myoc perf imaging showed EF 32% 04/2016.  Mason [name] upgraded to CRT-D 01/04/17.  [name] Chronic renal insufficiency, stage III (moderate) (HCC) 2015   CrCl about 60 ml/min  . Complete heart block (HCC)    Has dual chamber pacer.  . Depression   . DOE (dyspnea on exertion)    NYHA class II/III CHF  . Episodic low back pain 01/22/2013   w/intermittent radiculitis (12/2014 his neurologist referred him to pain mgmt for epidural steroid injection)  . Erectile dysfunction 2019   due to zoloft--urol rx'd viagra  . GERD (gastroesophageal reflux disease)   . H/O tilt table evaluation 11/02/05   negative  . Helicobacter pylori gastritis 01/2016  . History of adenomatous polyp of colon 10/12/11   Dr. [name] (3 right side of colon- tubular adenomas removed)  . History of cardiovascular stress test 05/28/12   no ischemia, EF 37%, imaging results are unchanged and within normal variance  .  History of chronic prostatitis   . History of kidney stones   . History of vertigo    + Hx of posterior HA's.  Neuro (Dr. Erling Cruz) eval 2011.  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  . Hyperlipidemia   . Hypertension   . Lumbar spondylosis    lumbosacral radiculopathy at L4 by EMG testing, right foot drop (neurologist is Dr. Linus Salmons with [facility] in [place])--neurologist referred him to neurosurgery  . Migraine    "used to have them all the time; none for years" (01/04/2017)  . Myocardial infarction [facility]) ?1970s   not entirely certain of this  . Nephrolithiasis 07/2012   Left UVJ 2 mm stone with dilation of renal collecting system and slight hydroureter on right  . Neuropathy   . NICM (nonischemic cardiomyopathy) ([place])    a. 02/2018 Cath: LM nl, LAD min irregs, LCX no, RCA 20d. [id number]. Fick CO/CI 4.4/2.0.  . 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  Since no CAD on cath 05/2016, cards recommends upgrade to CRT-D.  [name] Permanent atrial fibrillation    DCCV 07/09/13-converted, lasted two days, then back into afib--needs lifetime anticoagulation (Xarelto as of 09/2014)  . Prostate cancer screening 09/2017   done by urol annually (normal prostate exam documented + PSA 0.84 as of 10/01/17 urol f/u.  10/2018 urol f/u PSA 0.6, no prostate nodule.  . Rectus diastasis   . Right ankle sprain 08/2017   w/distal fibula avulsion fx noted on u/s but not plain film-(Dr. Hudnall).  . Skin cancer of arm, left    "burned it off" (01/04/2017)  . TIA (transient ischemic attack)    L face and L arm weakness. Peri procedural->a. 03/22/2018 following cath. CT head neg. No MRI b/c has pacer. Likely due to embolus to distal branch of RMCA  . Type II diabetes mellitus ([place])     Past Surgical History:  Procedure Laterality Date  . ABI's Bilateral 05/21/2018   normal  . BACK SURGERY    . BIV ICD INSERTION CRT-D N/A 01/04/2017   Procedure: BiV ICD ;  Surgeon: Constance Haw, MD;  Location: [facility];  Service: Cardiovascular;  Laterality: N/A;  . CARDIAC CATHETERIZATION N/A 06/14/2016   Minimal nonobstructive dz, EF 25-35%.  Procedure: Left Heart Cath and Coronary Angiography;  Surgeon: Peter M Martinique, MD;  Location: [facility];  Service: Cardiovascular;  Laterality: N/A;  .  CARDIOVASCULAR STRESS TEST  2012   2012 nuclear perfusion study: low risk scan; 04/2016 normal myocardial perfusion imaging, EF 32%.  [name] CARDIOVERSION  07/09/2012   Procedure: CARDIOVERSION;  Surgeon: [name], MD;  Location: [facility];  Service: Cardiovascular;  Laterality: N/A;  . CATARACT EXTRACTION W/ INTRAOCULAR LENS IMPLANT & ANTERIOR VITRECTOMY, BILATERAL Bilateral   . COLONOSCOPY W/ POLYPECTOMY  approx 2006; repeated 09/2011   Polyps on 2013 EGD as well, repeat 12/2014  . ESOPHAGOGASTRODUODENOSCOPY  10/18/06   Done due to chronic GERD: Normal, bx showed no barrett's esophagus (Dr. [name])  . FLEXOR TENDON REPAIR Left 10/02/2016   Procedure: LEFT RING FINGER WOUND EXPORATION AND FLEXOR TENDON REPAIR AND NERVE REPAIR;  Surgeon: Milly Jakob, MD;  Location: [facility];  Service: Orthopedics;  Laterality: Left;  . INSERT / REPLACE / REMOVE PACEMAKER  02/05/2012   dual chamber, sinus node dysfunction, sinus arrest, PAF, Medtronic Revo serial#-[id number] H: last checked 05/2015  . LUMBAR LAMINECTOMY Left  1976   L4-5  . PACEMAKER REMOVAL  01/04/2017  . PERMANENT PACEMAKER INSERTION N/A 02/05/2012   Procedure: PERMANENT PACEMAKER INSERTION;  Surgeon: [name], MD; Generator Medtronic Revo model [place] serial number [id number] H Laterality: N/A;  . RETINAL DETACHMENT SURGERY Left ~ 1999  . REVERSE SHOULDER ARTHROPLASTY Left 2018   Left shoulder reverse [name] in [place]).  [name] RIGHT/LEFT HEART CATH AND CORONARY ANGIOGRAPHY N/A 03/22/2018   EF 30-35%, no CAD.  Procedure: RIGHT/LEFT HEART CATH AND CORONARY ANGIOGRAPHY;  Surgeon: Jolaine Artist, MD;  Location: [facility];  Service: Cardiovascular;  Laterality: N/A;  . TRANSTHORACIC ECHOCARDIOGRAM  08/25/10; 05/2012; 03/23/16;12/2017   mild asymmetric LVH, normal systolic function, normal diastolic fxn, mild-to-mod mitral regurg, mild aortic valve sclerosis and trace AI, mild aortic root dilatation. 2014 f/u showed EF 40-45%,  mod LAE, A FIB.  02/2016 EF 40%, diffuse hypokinesis, grade 2 DD. 12/2017 EF 35-40%,diffuse hypokin,grd III DD, mild MR    Family History  Problem Relation Age of Onset  . Heart failure Mother   . Stroke Mother   . Stroke Father   . Heart disease Sister   . Heart disease Sister   . Cancer Sister        liver  . Cancer Brother        lung  . Cancer Brother        lung  . Heart disease Brother      Current Outpatient Medications:  .  acetaminophen (TYLENOL) 500 MG tablet, Take 1,000 mg by mouth every 6 (six) hours as needed for moderate pain or headache., Disp: , Rfl:  .  colchicine 0.6 MG tablet, Take 0.6 mg by mouth daily as needed (for gout). , Disp: , Rfl:  .  COMBIGAN 0.2-0.5 % ophthalmic solution, Place 1 drop into both eyes at bedtime. , Disp: , Rfl:  .  ENTRESTO 97-103 MG, TAKE 1 TABLET TWICE A DAY, Disp: 180 tablet, Rfl: 3 .  eplerenone (INSPRA) 25 MG tablet, Take 1 tablet (25 mg total) by mouth daily., Disp: 90 tablet, Rfl: 3 .  gabapentin (NEURONTIN) 300 MG capsule, 2 caps po bid, Disp: 360 capsule, Rfl: 3 .  glipiZIDE (GLUCOTROL XL) 10 MG 24 hr tablet, TAKE 1 TABLET EVERY MORNING (NEED OFFICE VISIT FOR MORE REFILLS), Disp: 90 tablet, Rfl: 1 .  glucose blood test strip, Use to check blood sugars 1-2 times daily, Disp: 100 each, Rfl: 3 .  insulin glargine (LANTUS) 100 UNIT/ML injection, Inject into the skin daily. Fluctuating dose, Disp: , Rfl:  .  Insulin Pen Needle (PEN NEEDLES) 31G X 5 MM MISC, 1 each by Does not apply route daily., Disp: 100 each, Rfl: 5 .  JARDIANCE 10 MG TABS tablet, TAKE 1 TABLET DAILY, Disp: 90 tablet, Rfl: 1 .  Lancets (FREESTYLE) lancets, Use as instructed, Disp: 100 each, Rfl: 6 .  latanoprost (XALATAN) 0.005 % ophthalmic solution, Place 1 drop into both eyes at bedtime. , Disp: , Rfl: 12 .  meclizine (ANTIVERT) 25 MG tablet, TAKE 1 TABLET THREE TIMES A DAY AS NEEDED FOR DIZZINESS, Disp: 270 tablet, Rfl: 3 .  metoprolol succinate (TOPROL-XL) 50 MG  24 hr tablet, Take 1 tablet (50 mg total) by mouth daily. Take with or immediately following a meal., Disp: 90 tablet, Rfl: 1 .  omeprazole (PRILOSEC) 40 MG capsule, TAKE 1 CAPSULE DAILY, Disp: 90 capsule, Rfl: 4 .  oxybutynin (DITROPAN-XL) 5 MG 24 hr tablet, Take 1 tablet (5 mg  total) by mouth at bedtime., Disp: 90 tablet, Rfl: 1 .  rOPINIRole (REQUIP) 1 MG tablet, TAKE 1 TABLET DAILY, Disp: 90 tablet, Rfl: 3 .  sertraline (ZOLOFT) 100 MG tablet, TAKE 1 TABLET DAILY, Disp: 90 tablet, Rfl: 4 .  simvastatin (ZOCOR) 20 MG tablet, Take 1 tablet (20 mg total) by mouth every evening., Disp: 90 tablet, Rfl: 1 .  XARELTO 20 MG TABS tablet, TAKE 1 TABLET DAILY WITH SUPPER, Disp: 90 tablet, Rfl: 1  EXAM:  VITALS per patient if applicable: There were no vitals taken for this visit.   GENERAL: alert, oriented, appears well and in no acute distress  HEENT: atraumatic, conjunttiva clear, no obvious abnormalities on inspection of external nose and ears  NECK: normal movements of the head and neck  LUNGS: on inspection no signs of respiratory distress, breathing rate appears normal, no obvious gross SOB, gasping or wheezing  CV: no obvious cyanosis  MS: moves all visible extremities without noticeable abnormality  PSYCH/NEURO: pleasant and cooperative, no obvious depression or anxiety, speech and thought processing grossly intact  LABS: none today.    Chemistry      Component Value Date/Time   NA 138 08/26/2018 1253   NA 139 12/28/2016 1405   K 4.6 08/26/2018 1253   CL 104 08/26/2018 1253   CO2 23 08/26/2018 1253   BUN 19 08/26/2018 1253   BUN 25 12/28/2016 1405   CREATININE 1.45 (H) 08/26/2018 1253   CREATININE 1.21 (H) 06/06/2016 1312      Component Value Date/Time   CALCIUM 9.6 08/26/2018 1253   ALKPHOS 78 07/27/2017 1158   AST 18 07/27/2017 1158   ALT 14 07/27/2017 1158   BILITOT 0.5 07/27/2017 1158     Lab Results  Component Value Date   WBC 4.7 04/04/2018   HGB 14.4  04/04/2018   HCT 44.4 04/04/2018   MCV 92.1 04/04/2018   PLT 149 (L) 04/04/2018   Lab Results  Component Value Date   HGBA1C 9.0 (A) 08/29/2018   HGBA1C 9.0 08/29/2018   HGBA1C 9.0 (A) 08/29/2018   HGBA1C 9.0 (A) 08/29/2018   Lab Results  Component Value Date   CHOL 137 03/23/2018   HDL 33 (L) 03/23/2018   LDLCALC 73 03/23/2018   TRIG 154 (H) 03/23/2018   CHOLHDL 4.2 03/23/2018     ASSESSMENT AND PLAN:  Discussed the following assessment and plan:  1) DM 2, with diab retinopathy; not well controlled. Increase lantus to 27 U, then continue to titrate up 1 U q3d until fasting glucose consistently in 100-110 range.  Check glucose qid. Encouraged better diet/more consistent intake of nutrients in order to minimize wide glucose fluctuations. HbA1c and lytes/cr -future.  2) HTN: The current medical regimen is effective;  continue present plan and medications. Lytes/cr today.  3) Hypercholesterolemia: tolerating statin. Diet is fair at best.  FLP and hepatic panel--future.  4) CRI III: avoids NSAIDs.  Hydrates adequately. Lytes/cr --future.  4) Chronic systolic HF (nonischemic CM of unknown etiology). Stable.  Continue periodic f/u with CHF clinic. Lytes/cr today.  He is on chronic anticoag for chronic a-fib.  Will monitor CBC--future. Spironolactone was changed to esplerinone back in 08/2018 due to gynecomastia, so I removed spironolactone from his med list today.  I discussed the assessment and treatment plan with the patient. The patient was provided an opportunity to ask questions and all were answered. The patient agreed with the plan and demonstrated an understanding of the instructions.   The patient  was advised to call back or seek an in-person evaluation if the symptoms worsen or if the condition fails to improve as anticipated.  F/u: 4 mo  Signed:  Crissie Sickles, MD           11/27/2018

## 2018-11-29 ENCOUNTER — Other Ambulatory Visit (INDEPENDENT_AMBULATORY_CARE_PROVIDER_SITE_OTHER): Payer: Medicare Other

## 2018-11-29 DIAGNOSIS — N183 Chronic kidney disease, stage 3 unspecified: Secondary | ICD-10-CM

## 2018-11-29 DIAGNOSIS — I5022 Chronic systolic (congestive) heart failure: Secondary | ICD-10-CM

## 2018-11-29 DIAGNOSIS — Z7901 Long term (current) use of anticoagulants: Secondary | ICD-10-CM | POA: Diagnosis not present

## 2018-11-29 DIAGNOSIS — Z794 Long term (current) use of insulin: Secondary | ICD-10-CM | POA: Diagnosis not present

## 2018-11-29 DIAGNOSIS — E7800 Pure hypercholesterolemia, unspecified: Secondary | ICD-10-CM

## 2018-11-29 DIAGNOSIS — E11319 Type 2 diabetes mellitus with unspecified diabetic retinopathy without macular edema: Secondary | ICD-10-CM

## 2018-11-29 DIAGNOSIS — I10 Essential (primary) hypertension: Secondary | ICD-10-CM

## 2018-11-29 LAB — CBC WITH DIFFERENTIAL/PLATELET
Basophils Absolute: 0 10*3/uL (ref 0.0–0.1)
Basophils Relative: 0.6 % (ref 0.0–3.0)
Eosinophils Absolute: 0.3 10*3/uL (ref 0.0–0.7)
Eosinophils Relative: 6.3 % — ABNORMAL HIGH (ref 0.0–5.0)
HCT: 48.5 % (ref 39.0–52.0)
Hemoglobin: 16.4 g/dL (ref 13.0–17.0)
Lymphocytes Relative: 27.8 % (ref 12.0–46.0)
Lymphs Abs: 1.4 10*3/uL (ref 0.7–4.0)
MCHC: 33.8 g/dL (ref 30.0–36.0)
MCV: 90 fl (ref 78.0–100.0)
Monocytes Absolute: 0.5 10*3/uL (ref 0.1–1.0)
Monocytes Relative: 9.1 % (ref 3.0–12.0)
Neutro Abs: 2.9 10*3/uL (ref 1.4–7.7)
Neutrophils Relative %: 56.2 % (ref 43.0–77.0)
Platelets: 140 10*3/uL — ABNORMAL LOW (ref 150.0–400.0)
RBC: 5.39 Mil/uL (ref 4.22–5.81)
RDW: 14.3 % (ref 11.5–15.5)
WBC: 5.2 10*3/uL (ref 4.0–10.5)

## 2018-11-29 LAB — LIPID PANEL
Cholesterol: 162 mg/dL (ref 0–200)
HDL: 32.7 mg/dL — ABNORMAL LOW (ref >39.00)
NonHDL: 129.36
Total CHOL/HDL Ratio: 5
Triglycerides: 267 mg/dL — ABNORMAL HIGH (ref 0.0–149.0)
VLDL: 53.4 mg/dL — ABNORMAL HIGH (ref 0.0–40.0)

## 2018-11-29 LAB — HEMOGLOBIN A1C: Hgb A1c MFr Bld: 10.1 % — ABNORMAL HIGH (ref 4.6–6.5)

## 2018-11-29 LAB — COMPREHENSIVE METABOLIC PANEL
ALT: 12 U/L (ref 0–53)
AST: 14 U/L (ref 0–37)
Albumin: 4.1 g/dL (ref 3.5–5.2)
Alkaline Phosphatase: 80 U/L (ref 39–117)
BUN: 33 mg/dL — ABNORMAL HIGH (ref 6–23)
CO2: 23 mEq/L (ref 19–32)
Calcium: 8.9 mg/dL (ref 8.4–10.5)
Chloride: 103 mEq/L (ref 96–112)
Creatinine, Ser: 1.55 mg/dL — ABNORMAL HIGH (ref 0.40–1.50)
GFR: 43.77 mL/min — ABNORMAL LOW (ref >60.00)
Glucose, Bld: 192 mg/dL — ABNORMAL HIGH (ref 70–99)
Potassium: 4.5 mEq/L (ref 3.5–5.1)
Sodium: 138 mEq/L (ref 135–145)
Total Bilirubin: 0.5 mg/dL (ref 0.2–1.2)
Total Protein: 7 g/dL (ref 6.0–8.3)

## 2018-11-29 LAB — LDL CHOLESTEROL, DIRECT: Direct LDL: 95 mg/dL

## 2018-12-05 ENCOUNTER — Other Ambulatory Visit: Payer: Self-pay | Admitting: Family Medicine

## 2018-12-05 MED ORDER — INSULIN ASPART 100 UNIT/ML FLEXPEN: PEN_INJECTOR | SUBCUTANEOUS | 1 refills | Status: DC

## 2018-12-24 DIAGNOSIS — H278 Other specified disorders of lens: Secondary | ICD-10-CM | POA: Diagnosis not present

## 2018-12-24 DIAGNOSIS — T8529XA Other mechanical complication of intraocular lens, initial encounter: Secondary | ICD-10-CM | POA: Diagnosis not present

## 2018-12-24 DIAGNOSIS — Z8669 Personal history of other diseases of the nervous system and sense organs: Secondary | ICD-10-CM | POA: Diagnosis not present

## 2018-12-24 DIAGNOSIS — H4312 Vitreous hemorrhage, left eye: Secondary | ICD-10-CM | POA: Diagnosis not present

## 2018-12-25 ENCOUNTER — Other Ambulatory Visit: Payer: Self-pay | Admitting: Family Medicine

## 2018-12-25 DIAGNOSIS — R1013 Epigastric pain: Secondary | ICD-10-CM | POA: Diagnosis not present

## 2018-12-25 DIAGNOSIS — K219 Gastro-esophageal reflux disease without esophagitis: Secondary | ICD-10-CM | POA: Diagnosis not present

## 2019-01-02 ENCOUNTER — Other Ambulatory Visit: Payer: Self-pay | Admitting: Family Medicine

## 2019-01-06 ENCOUNTER — Other Ambulatory Visit: Payer: Self-pay | Admitting: Gastroenterology

## 2019-01-07 NOTE — Progress Notes (Signed)
Spoke with Mason [name] to schedule covid screen prior to procedure on 6/19. Mason [name] stated he is canceling procedure.

## 2019-01-10 ENCOUNTER — Ambulatory Visit (HOSPITAL_COMMUNITY): Admission: RE | Admit: 2019-01-10 | Payer: Medicare Other | Source: Home / Self Care | Admitting: Gastroenterology

## 2019-01-10 SURGERY — ESOPHAGOGASTRODUODENOSCOPY (EGD) WITH PROPOFOL
Anesthesia: Monitor Anesthesia Care

## 2019-01-14 DIAGNOSIS — D225 Melanocytic nevi of trunk: Secondary | ICD-10-CM | POA: Diagnosis not present

## 2019-01-14 DIAGNOSIS — L218 Other seborrheic dermatitis: Secondary | ICD-10-CM | POA: Diagnosis not present

## 2019-01-14 DIAGNOSIS — L821 Other seborrheic keratosis: Secondary | ICD-10-CM | POA: Diagnosis not present

## 2019-01-14 DIAGNOSIS — L570 Actinic keratosis: Secondary | ICD-10-CM | POA: Diagnosis not present

## 2019-01-14 DIAGNOSIS — D1801 Hemangioma of skin and subcutaneous tissue: Secondary | ICD-10-CM | POA: Diagnosis not present

## 2019-01-16 ENCOUNTER — Other Ambulatory Visit: Payer: Self-pay | Admitting: Family Medicine

## 2019-01-17 ENCOUNTER — Other Ambulatory Visit: Payer: Self-pay

## 2019-01-17 NOTE — Telephone Encounter (Signed)
Patient called regarding his blood sugar being very high. Unsure of what he needs to do regarding the insulin he has now.  Please contact before refill meds.  Thank you

## 2019-01-20 ENCOUNTER — Telehealth: Payer: Self-pay | Admitting: Family Medicine

## 2019-01-20 NOTE — Telephone Encounter (Signed)
Noted  

## 2019-01-20 NOTE — Telephone Encounter (Signed)
His numbers have been running around 200. Patient is requesting in person visit. Does not want to do a virtual visit

## 2019-01-20 NOTE — Telephone Encounter (Addendum)
Spoke with patients wife, Tessie Fass.  Wife states diet has not changed, snacking on peanuts at night.  Patient requesting in person visit, not virtual.  Scheduled with PCP on Wednesday, 01/22/2019.   SW patient.  Patient currently taking Glipizide QD, Jardiance and Novolog 4 units TID.

## 2019-01-20 NOTE — Telephone Encounter (Signed)
Based on pt's recent labs on 11/29/18. He was to follow up in 2 weeks after starting new insulin, Novolog.  Please advise, thanks.

## 2019-01-21 ENCOUNTER — Ambulatory Visit (INDEPENDENT_AMBULATORY_CARE_PROVIDER_SITE_OTHER): Payer: Medicare Other | Admitting: *Deleted

## 2019-01-21 DIAGNOSIS — I5022 Chronic systolic (congestive) heart failure: Secondary | ICD-10-CM | POA: Diagnosis not present

## 2019-01-21 NOTE — Telephone Encounter (Signed)
Pt was contacted yesterday and scheduled for in office visit tomorrow to discuss with PCP.

## 2019-01-22 ENCOUNTER — Encounter: Payer: Self-pay | Admitting: Family Medicine

## 2019-01-22 ENCOUNTER — Other Ambulatory Visit: Payer: Self-pay

## 2019-01-22 ENCOUNTER — Telehealth: Payer: Self-pay

## 2019-01-22 ENCOUNTER — Telehealth: Payer: Self-pay | Admitting: Cardiovascular Disease

## 2019-01-22 ENCOUNTER — Ambulatory Visit (INDEPENDENT_AMBULATORY_CARE_PROVIDER_SITE_OTHER): Payer: Medicare Other | Admitting: Family Medicine

## 2019-01-22 VITALS — BP 118/70 | HR 71 | Temp 98.0°F | Resp 16 | Ht 71.0 in | Wt 235.0 lb

## 2019-01-22 DIAGNOSIS — R413 Other amnesia: Secondary | ICD-10-CM | POA: Diagnosis not present

## 2019-01-22 DIAGNOSIS — Z91199 Patient's noncompliance with other medical treatment and regimen due to unspecified reason: Secondary | ICD-10-CM

## 2019-01-22 DIAGNOSIS — E1165 Type 2 diabetes mellitus with hyperglycemia: Secondary | ICD-10-CM | POA: Diagnosis not present

## 2019-01-22 DIAGNOSIS — Z9119 Patient's noncompliance with other medical treatment and regimen: Secondary | ICD-10-CM

## 2019-01-22 LAB — CUP PACEART REMOTE DEVICE CHECK
Battery Remaining Longevity: 65 mo
Battery Voltage: 2.97 V
Brady Statistic AP VP Percent: 0 %
Brady Statistic AP VS Percent: 0 %
Brady Statistic AS VP Percent: 0 %
Brady Statistic AS VS Percent: 0 %
Brady Statistic RA Percent Paced: 0 %
Brady Statistic RV Percent Paced: 99.69 %
Date Time Interrogation Session: 20200701204327
HighPow Impedance: 80 Ohm
Implantable Lead Implant Date: 20130715
Implantable Lead Implant Date: 20180614
Implantable Lead Implant Date: 20180614
Implantable Lead Location: 753858
Implantable Lead Location: 753859
Implantable Lead Location: 753860
Implantable Lead Model: 4598
Implantable Pulse Generator Implant Date: 20180614
Lead Channel Impedance Value: 204.14 Ohm
Lead Channel Impedance Value: 204.14 Ohm
Lead Channel Impedance Value: 216.848
Lead Channel Impedance Value: 222.34 Ohm
Lead Channel Impedance Value: 222.34 Ohm
Lead Channel Impedance Value: 399 Ohm
Lead Channel Impedance Value: 399 Ohm
Lead Channel Impedance Value: 418 Ohm
Lead Channel Impedance Value: 418 Ohm
Lead Channel Impedance Value: 475 Ohm
Lead Channel Impedance Value: 513 Ohm
Lead Channel Impedance Value: 513 Ohm
Lead Channel Impedance Value: 532 Ohm
Lead Channel Impedance Value: 703 Ohm
Lead Channel Impedance Value: 722 Ohm
Lead Channel Impedance Value: 760 Ohm
Lead Channel Impedance Value: 779 Ohm
Lead Channel Impedance Value: 779 Ohm
Lead Channel Pacing Threshold Amplitude: 0.5 V
Lead Channel Pacing Threshold Amplitude: 0.625 V
Lead Channel Pacing Threshold Pulse Width: 0.4 ms
Lead Channel Pacing Threshold Pulse Width: 1 ms
Lead Channel Sensing Intrinsic Amplitude: 0.625 mV
Lead Channel Sensing Intrinsic Amplitude: 20.5 mV
Lead Channel Sensing Intrinsic Amplitude: 20.5 mV
Lead Channel Setting Pacing Amplitude: 1 V
Lead Channel Setting Pacing Amplitude: 2 V
Lead Channel Setting Pacing Pulse Width: 0.4 ms
Lead Channel Setting Pacing Pulse Width: 1 ms
Lead Channel Setting Sensing Sensitivity: 0.3 mV

## 2019-01-22 MED ORDER — NOVOLOG FLEXPEN 100 UNIT/ML ~~LOC~~ SOPN: PEN_INJECTOR | SUBCUTANEOUS | 6 refills | Status: DC

## 2019-01-22 MED ORDER — INSULIN GLARGINE 100 UNIT/ML ~~LOC~~ SOLN: SUBCUTANEOUS | 6 refills | Status: DC

## 2019-01-22 NOTE — Progress Notes (Signed)
OFFICE VISIT  01/22/2019   CC:  Chief Complaint  Patient presents with  . increased blood sugars   HPI:    Patient is a 78 y.o. Caucasian male who presents for hyperglycemia.  My recommendations to him when I got his last lab results 11/29/18 were: "All labs stable except HbA1c continues to rise: it is now 10.1% (needs to be around 7 %).  He should continue to gradually increase his lantus like we discussed at recent visit (at that time he had only increased dose to 24 U qd).  New recommendations: stop glipizide-->this med is no longer helpful for him.  Continue jardiance at current dose.  Start mealtime insulin-->I'll eRx novolog. He should take 3 Units SQ right before each meal.   Check glucose fasting in the morning and also check prior to lunch, prior to supper, and at bedtime (4 times every day). Needs virtual visit f/u approx 2 weeks after starting his new insulin."  He did not f/u as instructed.  More and more over the last few visits I have noted him having problems recalling our plan from the previous visit.  Pt says no one else has questioned his memory lately.    Says his sugars are going up and he doesn't know why. He ran out of lantus about a week ago.  He had increased it 36.  He is only giving himself 2 u mealtime insulin at each meal. His memory is getting more impaired. Usually checks glucose about 3 times more a day.  Ballpark estimate of glucoses PP is about 190. He admits diet has always been a big problem.  Eats too much sweets and breads.  He eats peanuts and diet coke daily. Potatoes not a big problem, nor is pasta.  Rice--loves.  Uses some butter in oatmeal.  Eats frosted minwheats.    Past Medical History:  Diagnosis Date  . AICD (automatic cardioverter/defibrillator) present 2018   MDT CRT-D.  Fatigue-->completely pacer dependent.  Pacer settings adjusted 12/2017 to allow more chronotrophc variance with ADL's//exertion.  . Arthritis    Pt is s/p eft reverse  total shoulder arthroplasty.  . Balanitis    chronic fungal  . BPH (benign prostatic hypertrophy) 06/2011   Irritative sx's; pt declined trial of anticholinergic per Urology records  . Chronic combined systolic and diastolic heart failure ([place]) 05/31/2012   Nonischemic:  EF 40-45%, LA mod-severe dilated, AFIB.   02/2016 EF 40%, diffuse hypokinesis, grade 2 DD.  Myoc perf imaging showed EF 32% 04/2016.  Pt upgraded to CRT-D 01/04/17.  [name] Chronic renal insufficiency, stage III (moderate) (HCC) 2015   CrCl about 60 ml/min  . Complete heart block (HCC)    Has dual chamber pacer.  . Depression   . DOE (dyspnea on exertion)    NYHA class II/III CHF  . Episodic low back pain 01/22/2013   w/intermittent radiculitis (12/2014 his neurologist referred him to pain mgmt for epidural steroid injection)  . Erectile dysfunction 2019   due to zoloft--urol rx'd viagra  . GERD (gastroesophageal reflux disease)   . H/O tilt table evaluation 11/02/05   negative  . Helicobacter pylori gastritis 01/2016  . History of adenomatous polyp of colon 10/12/11   Dr. [name] (3 right side of colon- tubular adenomas removed)  . History of cardiovascular stress test 05/28/12   no ischemia, EF 37%, imaging results are unchanged and within normal variance  . History of chronic prostatitis   . History of kidney stones   .  History of vertigo    + Hx of posterior HA's.  Neuro (Dr. Erling Cruz) eval 2011.  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  . Hyperlipidemia   . Hypertension   . Lumbar spondylosis    lumbosacral radiculopathy at L4 by EMG testing, right foot drop (neurologist is Dr. Linus Salmons with [facility] in [place])--neurologist referred him to neurosurgery  . Migraine    "used to have them all the time; none for years" (01/04/2017)  . Myocardial infarction [facility]) ?1970s   not entirely certain of this  . Nephrolithiasis 07/2012   Left UVJ 2 mm  stone with dilation of renal collecting system and slight hydroureter on right  . Neuropathy   . NICM (nonischemic cardiomyopathy) ([place])    a. 02/2018 Cath: LM nl, LAD min irregs, LCX no, RCA 20d. [id number]. Fick CO/CI 4.4/2.0.  . 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  Since no CAD on cath 05/2016, cards recommends upgrade to CRT-D.  [name] Permanent atrial fibrillation    DCCV 07/09/13-converted, lasted two days, then back into afib--needs lifetime anticoagulation (Xarelto as of 09/2014)  . Prostate cancer screening 09/2017   done by urol annually (normal prostate exam documented + PSA 0.84 as of 10/01/17 urol f/u.  10/2018 urol f/u PSA 0.6, no prostate nodule.  . Rectus diastasis   . Right ankle sprain 08/2017   w/distal fibula avulsion fx noted on u/s but not plain film-(Dr. Hudnall).  . Skin cancer of arm, left    "burned it off" (01/04/2017)  . TIA (transient ischemic attack)    L face and L arm weakness. Peri procedural->a. 03/22/2018 following cath. CT head neg. No MRI b/c has pacer. Likely due to embolus to distal branch of RMCA  . Type II diabetes mellitus ([place])     Past Surgical History:  Procedure Laterality Date  . ABI's Bilateral 05/21/2018   normal  . BACK SURGERY    . BIV ICD INSERTION CRT-D N/A 01/04/2017   Procedure: BiV ICD ;  Surgeon: Constance Haw, MD;  Location: [place] [facility];  Service: Cardiovascular;  Laterality: N/A;  . CARDIAC CATHETERIZATION N/A 06/14/2016   Minimal nonobstructive dz, EF 25-35%.  Procedure: Left Heart Cath and Coronary Angiography;  Surgeon: Peter M Martinique, MD;  Location: [place] [facility];  Service: Cardiovascular;  Laterality: N/A;  . CARDIOVASCULAR STRESS TEST  2012   2012 nuclear perfusion study: low risk scan; 04/2016 normal myocardial perfusion imaging, EF 32%.  [name] CARDIOVERSION  07/09/2012   Procedure: CARDIOVERSION;  Surgeon: [name], MD;  Location: [place];   Service: Cardiovascular;  Laterality: N/A;  . CATARACT EXTRACTION W/ INTRAOCULAR LENS IMPLANT & ANTERIOR VITRECTOMY, BILATERAL Bilateral   . COLONOSCOPY W/ POLYPECTOMY  approx 2006; repeated 09/2011   Polyps on 2013 EGD as well, repeat 12/2014  . ESOPHAGOGASTRODUODENOSCOPY  10/18/06   Done due to chronic GERD: Normal, bx showed no barrett's esophagus (Dr. [name])  . FLEXOR TENDON REPAIR Left 10/02/2016   Procedure: LEFT RING FINGER WOUND EXPORATION AND FLEXOR TENDON REPAIR AND NERVE REPAIR;  Surgeon: Milly Jakob, MD;  Location: [place];  Service: Orthopedics;  Laterality: Left;  . INSERT / REPLACE / REMOVE PACEMAKER  02/05/2012   dual chamber, sinus node dysfunction, sinus arrest, PAF, Medtronic Revo serial#-[id number] H: last checked 05/2015  . LUMBAR LAMINECTOMY Left 1976   L4-5  . PACEMAKER REMOVAL  01/04/2017  . PERMANENT PACEMAKER  INSERTION N/A 02/05/2012   Procedure: PERMANENT PACEMAKER INSERTION;  Surgeon: [name], MD; Generator Medtronic Revo model [place] serial number [id number] H Laterality: N/A;  . RETINAL DETACHMENT SURGERY Left ~ 1999  . REVERSE SHOULDER ARTHROPLASTY Left 2018   Left shoulder reverse [name] in [place]).  [name] RIGHT/LEFT HEART CATH AND CORONARY ANGIOGRAPHY N/A 03/22/2018   EF 30-35%, no CAD.  Procedure: RIGHT/LEFT HEART CATH AND CORONARY ANGIOGRAPHY;  Surgeon: Jolaine Artist, MD;  Location: [place] [facility];  Service: Cardiovascular;  Laterality: N/A;  . TRANSTHORACIC ECHOCARDIOGRAM  08/25/10; 05/2012; 03/23/16;12/2017   mild asymmetric LVH, normal systolic function, normal diastolic fxn, mild-to-mod mitral regurg, mild aortic valve sclerosis and trace AI, mild aortic root dilatation. 2014 f/u showed EF 40-45%, mod LAE, A FIB.  02/2016 EF 40%, diffuse hypokinesis, grade 2 DD. 12/2017 EF 35-40%,diffuse hypokin,grd III DD, mild MR    Outpatient Medications Prior to Visit  Medication Sig Dispense Refill  . acetaminophen (TYLENOL) 500 MG tablet Take 1,000  mg by mouth every 6 (six) hours as needed for moderate pain or headache.    . colchicine 0.6 MG tablet Take 0.6 mg by mouth daily as needed (for gout).     . COMBIGAN 0.2-0.5 % ophthalmic solution Place 1 drop into both eyes at bedtime.     [name] ENTRESTO 97-103 MG TAKE 1 TABLET TWICE A DAY 180 tablet 3  . eplerenone (INSPRA) 25 MG tablet Take 1 tablet (25 mg total) by mouth daily. 90 tablet 3  . gabapentin (NEURONTIN) 300 MG capsule 2 caps po bid 360 capsule 3  . glucose blood test strip Use to check blood sugars 1-2 times daily 100 each 3  . Insulin Pen Needle (PEN NEEDLES) 31G X 5 MM MISC 1 each by Does not apply route daily. 100 each 5  . JARDIANCE 10 MG TABS tablet TAKE 1 TABLET DAILY 90 tablet 1  . Lancets (FREESTYLE) lancets Use as instructed 100 each 6  . latanoprost (XALATAN) 0.005 % ophthalmic solution Place 1 drop into both eyes at bedtime.   12  . meclizine (ANTIVERT) 25 MG tablet TAKE 1 TABLET THREE TIMES A DAY AS NEEDED FOR DIZZINESS 270 tablet 3  . omeprazole (PRILOSEC) 40 MG capsule TAKE 1 CAPSULE DAILY 90 capsule 4  . rOPINIRole (REQUIP) 1 MG tablet TAKE 1 TABLET DAILY 90 tablet 3  . sertraline (ZOLOFT) 100 MG tablet TAKE 1 TABLET DAILY 90 tablet 4  . simvastatin (ZOCOR) 20 MG tablet TAKE 1 TABLET EVERY EVENING 90 tablet 1  . XARELTO 20 MG TABS tablet TAKE 1 TABLET DAILY WITH SUPPER 90 tablet 1  . insulin aspart (NOVOLOG FLEXPEN) 100 UNIT/ML FlexPen 3 U SQ at the time of each meal 9 mL 1  . insulin glargine (LANTUS) 100 UNIT/ML injection Inject into the skin daily. Fluctuating dose    . metoprolol succinate (TOPROL-XL) 50 MG 24 hr tablet Take 1 tablet (50 mg total) by mouth daily. Take with or immediately following a meal. (Patient not taking: Reported on 01/22/2019) 90 tablet 1  . oxybutynin (DITROPAN-XL) 5 MG 24 hr tablet Take 1 tablet (5 mg total) by mouth at bedtime. (Patient not taking: Reported on 01/22/2019) 90 tablet 1   No facility-administered medications prior to visit.      No Known Allergies  ROS As per HPI  PE: Blood pressure 118/70, pulse 71, temperature 98 F (36.7 C), temperature source Temporal, resp. rate 16, height 5\' 11"  (1.803 m), weight 235 lb (  106.6 kg), SpO2 95 %. Gen: Alert, well appearing.  Patient is oriented to person, place, time, and situation. AFFECT: pleasant, lucid thought and speech. MMSE today 29/30.  No further exam today.  LABS:  Lab Results  Component Value Date   HGBA1C 10.1 (H) 11/29/2018     Chemistry      Component Value Date/Time   NA 138 11/29/2018 0957   NA 139 12/28/2016 1405   K 4.5 11/29/2018 0957   CL 103 11/29/2018 0957   CO2 23 11/29/2018 0957   BUN 33 (H) 11/29/2018 0957   BUN 25 12/28/2016 1405   CREATININE 1.55 (H) 11/29/2018 0957   CREATININE 1.21 (H) 06/06/2016 1312      Component Value Date/Time   CALCIUM 8.9 11/29/2018 0957   ALKPHOS 80 11/29/2018 0957   AST 14 11/29/2018 0957   ALT 12 11/29/2018 0957   BILITOT 0.5 11/29/2018 0957     Lab Results  Component Value Date   CHOL 162 11/29/2018   HDL 32.70 (L) 11/29/2018   LDLCALC 73 03/23/2018   LDLDIRECT 95.0 11/29/2018   TRIG 267.0 (H) 11/29/2018   CHOLHDL 5 11/29/2018   Lab Results  Component Value Date   WBC 5.2 11/29/2018   HGB 16.4 11/29/2018   HCT 48.5 11/29/2018   MCV 90.0 11/29/2018   PLT 140.0 (L) 11/29/2018    IMPRESSION AND PLAN:  1) Poorly controlled DM, noncompliant with lantus at times, noncompliant with DIET is a huge problem for him. His impression is that the insulin has been making his glucoses worse, but he admits he didn't start regularly checking his glucoses until AFTER his A1c was up to 10% and I started him on insulin. Instructions: Choose 2 things about your diet that you can consistently improve. Increase your lantus to 40 Units once per day. Increase your mealtime insulin (Novolog pen) to 6 Units with each meal. Continue to check your blood sugar fasting every morning and also before each meal of  the day.  These instructions were explained clearly today more than once AND I had my CMA go over them with him separately.  2) Memory impairment:  As of now, his MMSE testing doesn't reveal any cognitive impairment, but I remain suspicious.  An After Visit Summary was printed and given to the patient.  FOLLOW UP: Return in about 6 weeks (around 03/05/2019) for routine chronic illness f/u.  Signed:  Crissie Sickles, MD           01/22/2019

## 2019-01-22 NOTE — Telephone Encounter (Signed)
Pt wants to know if you received his transmission today?

## 2019-01-22 NOTE — Patient Instructions (Addendum)
Choose 2 things about your diet that you can consistently improve.  Increase your lantus to 40 Units once per day.  Increase your mealtime insulin (Novolog pen) to 6 Units with each meal.  Continue to check your blood sugar fasting every morning and also before each meal of the day.

## 2019-01-22 NOTE — Telephone Encounter (Signed)
Left message for patient to remind of missed remote transmission.  

## 2019-01-22 NOTE — Telephone Encounter (Signed)
Spoke w/ pt and informed him that his remote transmission was not received. Pt is receiving an orange screen with error code 3230. Instructed pt to call tech support. Pt verbalized understanding.

## 2019-01-23 ENCOUNTER — Telehealth: Payer: Self-pay

## 2019-01-23 ENCOUNTER — Other Ambulatory Visit: Payer: Self-pay

## 2019-01-23 MED ORDER — "EASY COMFORT INSULIN SYRINGE 32G X 5/16"" 1 ML MISC": 1.0000 | Freq: Every day | 2 refills | Status: DC

## 2019-01-23 NOTE — Telephone Encounter (Signed)
Spoke with Lilia at pt's pharmacy to clarify vials or pens for Lantus. Vials were cheaper but both covered by insurance. Pt did not have syringes. Rx sent, pt was notified and given clarification on Lantus.

## 2019-01-23 NOTE — Telephone Encounter (Signed)
Received voicemail from pt's local pharmacy regarding Lantus. They need clarification if it should be vials or solostar?

## 2019-01-23 NOTE — Telephone Encounter (Signed)
I did RF of the one in his EMR, which was vials. However, if insurer covers solostar pens then I'd like to switch him to this.-thx

## 2019-01-23 NOTE — Progress Notes (Signed)
syri

## 2019-01-23 NOTE — Telephone Encounter (Signed)
Patient is requesting clarification on Lantus.   Please call home and/or cell

## 2019-01-27 ENCOUNTER — Other Ambulatory Visit: Payer: Self-pay | Admitting: Family Medicine

## 2019-02-01 ENCOUNTER — Encounter: Payer: Self-pay | Admitting: Cardiology

## 2019-02-01 NOTE — Progress Notes (Signed)
Remote ICD transmission.   

## 2019-02-12 ENCOUNTER — Other Ambulatory Visit: Payer: Self-pay

## 2019-02-12 MED ORDER — PEN NEEDLES 31G X 5 MM MISC: 1.0000 | Freq: Every day | 0 refills | Status: DC

## 2019-02-12 MED ORDER — PEN NEEDLES 31G X 5 MM MISC: 1.0000 | Freq: Every day | 5 refills | Status: DC

## 2019-02-26 ENCOUNTER — Other Ambulatory Visit: Payer: Self-pay

## 2019-02-26 ENCOUNTER — Encounter: Payer: Self-pay | Admitting: Family Medicine

## 2019-02-26 ENCOUNTER — Ambulatory Visit (INDEPENDENT_AMBULATORY_CARE_PROVIDER_SITE_OTHER): Payer: Medicare Other | Admitting: Family Medicine

## 2019-02-26 VITALS — BP 93/61 | HR 76 | Temp 98.3°F | Resp 16 | Ht 71.0 in | Wt 239.4 lb

## 2019-02-26 DIAGNOSIS — E118 Type 2 diabetes mellitus with unspecified complications: Secondary | ICD-10-CM | POA: Diagnosis not present

## 2019-02-26 DIAGNOSIS — N183 Chronic kidney disease, stage 3 unspecified: Secondary | ICD-10-CM

## 2019-02-26 LAB — BASIC METABOLIC PANEL
BUN: 25 mg/dL — ABNORMAL HIGH (ref 6–23)
CO2: 27 mEq/L (ref 19–32)
Calcium: 9.3 mg/dL (ref 8.4–10.5)
Chloride: 103 mEq/L (ref 96–112)
Creatinine, Ser: 1.63 mg/dL — ABNORMAL HIGH (ref 0.40–1.50)
GFR: 41.27 mL/min — ABNORMAL LOW (ref >60.00)
Glucose, Bld: 129 mg/dL — ABNORMAL HIGH (ref 70–99)
Potassium: 4.9 mEq/L (ref 3.5–5.1)
Sodium: 138 mEq/L (ref 135–145)

## 2019-02-26 LAB — HEMOGLOBIN A1C: Hgb A1c MFr Bld: 9 % — ABNORMAL HIGH (ref 4.6–6.5)

## 2019-02-26 NOTE — Progress Notes (Addendum)
OFFICE VISIT  02/26/2019   CC:  Chief Complaint  Patient presents with  . Follow-up    RCI, pt is fasting   HPI:    Patient is a 78 y.o. Caucasian male who presents for 6 wk f/u DM 2.  He has nonischemic CM (syst and diast dysfxn) and has CRT-D, also chronic a-fib and complete heart block (pacer dependent),andconsistently has NYHA class II/III sx's. Most recent CHF clinic visit was 09/02/18:  All was stable, no changes made.    Last f/u visit->Lantus increased to 40 U qhs and novolog increased to 6 U at each meal last visit.  Interim hx:  Pt states he is a more consistently improved diet->better than his usual in the past. Fasting glucs 115 or so avg, lowest in the 80s.  Rare check later in the day.   He has poor recollection of specifics.  Compliant with all meds.  ROS: no CP, no SOB, no wheezing, no cough, no dizziness, no HAs, no rashes, no melena/hematochezia.  No polyuria or polydipsia.  No myalgias or arthralgias.  Past Medical History:  Diagnosis Date  . AICD (automatic cardioverter/defibrillator) present 2018   MDT CRT-D.  Fatigue-->completely pacer dependent.  Pacer settings adjusted 12/2017 to allow more chronotrophc variance with ADL's//exertion.  . Arthritis    Pt is s/p eft reverse total shoulder arthroplasty.  . Balanitis    chronic fungal  . BPH (benign prostatic hypertrophy) 06/2011   Irritative sx's; pt declined trial of anticholinergic per Urology records  . Chronic combined systolic and diastolic heart failure ([place]) 05/31/2012   Nonischemic:  EF 40-45%, LA mod-severe dilated, AFIB.   02/2016 EF 40%, diffuse hypokinesis, grade 2 DD.  Myoc perf imaging showed EF 32% 04/2016.  Pt upgraded to CRT-D 01/04/17.  [name] Chronic renal insufficiency, stage III (moderate) (HCC) 2015   CrCl about 60 ml/min  . Complete heart block (HCC)    Has dual chamber pacer.  . Depression   . DOE (dyspnea on exertion)    NYHA class II/III CHF  . Episodic low back pain 01/22/2013   w/intermittent radiculitis (12/2014 his neurologist referred him to pain mgmt for epidural steroid injection)  . Erectile dysfunction 2019   due to zoloft--urol rx'd viagra  . GERD (gastroesophageal reflux disease)   . H/O tilt table evaluation 11/02/05   negative  . Helicobacter pylori gastritis 01/2016  . History of adenomatous polyp of colon 10/12/11   Dr. [name] (3 right side of colon- tubular adenomas removed)  . History of cardiovascular stress test 05/28/12   no ischemia, EF 37%, imaging results are unchanged and within normal variance  . History of chronic prostatitis   . History of kidney stones   . History of vertigo    + Hx of posterior HA's.  Neuro (Dr. Erling Cruz) eval 2011.  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  . Hyperlipidemia   . Hypertension   . Lumbar spondylosis    lumbosacral radiculopathy at L4 by EMG testing, right foot drop (neurologist is Dr. Linus Salmons with [facility] in [place])--neurologist referred him to neurosurgery  . Migraine    "used to have them all the time; none for years" (01/04/2017)  . Myocardial infarction [facility]) ?1970s   not entirely certain of this  . Nephrolithiasis 07/2012   Left UVJ 2 mm stone with dilation of renal collecting system and slight hydroureter on right  . Neuropathy   . NICM (nonischemic cardiomyopathy) ([place])  a. 02/2018 Cath: LM nl, LAD min irregs, LCX no, RCA 20d. FTDD22. Fick CO/CI 4.4/2.0.  . 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  Since no CAD on cath 05/2016, cards recommends upgrade to CRT-D.  [name] Permanent atrial fibrillation    DCCV 07/09/13-converted, lasted two days, then back into afib--needs lifetime anticoagulation (Xarelto as of 09/2014)  . Prostate cancer screening 09/2017   done by urol annually (normal prostate exam documented + PSA 0.84 as of 10/01/17 urol f/u.  10/2018 urol f/u  PSA 0.6, no prostate nodule.  . Rectus diastasis   . Right ankle sprain 08/2017   w/distal fibula avulsion fx noted on u/s but not plain film-(Dr. Hudnall).  . Skin cancer of arm, left    "burned it off" (01/04/2017)  . TIA (transient ischemic attack)    L face and L arm weakness. Peri procedural->a. 03/22/2018 following cath. CT head neg. No MRI b/c has pacer. Likely due to embolus to distal branch of RMCA  . Type II diabetes mellitus ([place])     Past Surgical History:  Procedure Laterality Date  . ABI's Bilateral 05/21/2018   normal  . BACK SURGERY    . BIV ICD INSERTION CRT-D N/A 01/04/2017   Procedure: BiV ICD ;  Surgeon: Constance Haw, MD;  Location: [place] [facility];  Service: Cardiovascular;  Laterality: N/A;  . CARDIAC CATHETERIZATION N/A 06/14/2016   Minimal nonobstructive dz, EF 25-35%.  Procedure: Left Heart Cath and Coronary Angiography;  Surgeon: Peter M Martinique, MD;  Location: [place] [facility];  Service: Cardiovascular;  Laterality: N/A;  . CARDIOVASCULAR STRESS TEST  2012   2012 nuclear perfusion study: low risk scan; 04/2016 normal myocardial perfusion imaging, EF 32%.  [name] CARDIOVERSION  07/09/2012   Procedure: CARDIOVERSION;  Surgeon: [name], MD;  Location: [place];  Service: Cardiovascular;  Laterality: N/A;  . CATARACT EXTRACTION W/ INTRAOCULAR LENS IMPLANT & ANTERIOR VITRECTOMY, BILATERAL Bilateral   . COLONOSCOPY W/ POLYPECTOMY  approx 2006; repeated 09/2011   Polyps on 2013 EGD as well, repeat 12/2014  . ESOPHAGOGASTRODUODENOSCOPY  10/18/06   Done due to chronic GERD: Normal, bx showed no barrett's esophagus (Dr. [name])  . FLEXOR TENDON REPAIR Left 10/02/2016   Procedure: LEFT RING FINGER WOUND EXPORATION AND FLEXOR TENDON REPAIR AND NERVE REPAIR;  Surgeon: Milly Jakob, MD;  Location: [place];  Service: Orthopedics;  Laterality: Left;  . INSERT / REPLACE / REMOVE PACEMAKER  02/05/2012   dual chamber, sinus node dysfunction, sinus arrest, PAF,  Medtronic Revo serial#-[id number] H: last checked 05/2015  . LUMBAR LAMINECTOMY Left 1976   L4-5  . PACEMAKER REMOVAL  01/04/2017  . PERMANENT PACEMAKER INSERTION N/A 02/05/2012   Procedure: PERMANENT PACEMAKER INSERTION;  Surgeon: [name], MD; Generator Medtronic Revo model [place] serial number [id number] H Laterality: N/A;  . RETINAL DETACHMENT SURGERY Left ~ 1999  . REVERSE SHOULDER ARTHROPLASTY Left 2018   Left shoulder reverse [name] in [place]).  [name] RIGHT/LEFT HEART CATH AND CORONARY ANGIOGRAPHY N/A 03/22/2018   EF 30-35%, no CAD.  Procedure: RIGHT/LEFT HEART CATH AND CORONARY ANGIOGRAPHY;  Surgeon: Jolaine Artist, MD;  Location: [place] [facility];  Service: Cardiovascular;  Laterality: N/A;  . TRANSTHORACIC ECHOCARDIOGRAM  08/25/10; 05/2012; 03/23/16;12/2017   mild asymmetric LVH, normal systolic function, normal diastolic fxn, mild-to-mod mitral regurg, mild aortic valve sclerosis and trace AI, mild aortic root dilatation. 2014 f/u showed EF 40-45%, mod LAE, A FIB.  02/2016  EF 40%, diffuse hypokinesis, grade 2 DD. 12/2017 EF 35-40%,diffuse hypokin,grd III DD, mild MR    Outpatient Medications Prior to Visit  Medication Sig Dispense Refill  . acetaminophen (TYLENOL) 500 MG tablet Take 1,000 mg by mouth every 6 (six) hours as needed for moderate pain or headache.    . colchicine 0.6 MG tablet Take 0.6 mg by mouth daily as needed (for gout).     . COMBIGAN 0.2-0.5 % ophthalmic solution Place 1 drop into both eyes at bedtime.     [name] ENTRESTO 97-103 MG TAKE 1 TABLET TWICE A DAY 180 tablet 3  . eplerenone (INSPRA) 25 MG tablet Take 1 tablet (25 mg total) by mouth daily. 90 tablet 3  . gabapentin (NEURONTIN) 300 MG capsule 2 caps po bid 360 capsule 3  . glucose blood test strip Use to check blood sugars 1-2 times daily 100 each 3  . hydrocortisone 2.5 % cream APPLY TO SKIN TWICE A DAY    . insulin aspart (NOVOLOG FLEXPEN) 100 UNIT/ML FlexPen 6 U SQ at the time of each meal 18  mL 6  . insulin glargine (LANTUS) 100 UNIT/ML injection 40 Units SQ qd.  Pt will be titrating his dose periodically. 30 mL 6  . Insulin Pen Needle (PEN NEEDLES) 31G X 5 MM MISC 1 each by Does not apply route daily. 100 each 0  . Insulin Syringe-Needle U-100 (EASY COMFORT INSULIN SYRINGE) 32G X 5/16" 1 ML MISC 1 each by Does not apply route daily. 100 each 2  . JARDIANCE 10 MG TABS tablet TAKE 1 TABLET DAILY 90 tablet 1  . Lancets (FREESTYLE) lancets Use as instructed 100 each 6  . latanoprost (XALATAN) 0.005 % ophthalmic solution Place 1 drop into both eyes at bedtime.   12  . meclizine (ANTIVERT) 25 MG tablet TAKE 1 TABLET THREE TIMES A DAY AS NEEDED FOR DIZZINESS 270 tablet 3  . metoprolol succinate (TOPROL-XL) 50 MG 24 hr tablet Take 1 tablet (50 mg total) by mouth daily. Take with or immediately following a meal. 90 tablet 1  . omeprazole (PRILOSEC) 40 MG capsule TAKE 1 CAPSULE DAILY 90 capsule 4  . oxybutynin (DITROPAN-XL) 5 MG 24 hr tablet Take 1 tablet (5 mg total) by mouth at bedtime. 90 tablet 1  . rOPINIRole (REQUIP) 1 MG tablet TAKE 1 TABLET DAILY 90 tablet 3  . sertraline (ZOLOFT) 100 MG tablet TAKE 1 TABLET DAILY 90 tablet 4  . simvastatin (ZOCOR) 20 MG tablet TAKE 1 TABLET EVERY EVENING 90 tablet 1  . XARELTO 20 MG TABS tablet TAKE 1 TABLET DAILY WITH SUPPER 90 tablet 1   No facility-administered medications prior to visit.     No Known Allergies  ROS As per HPI  PE: Blood pressure 93/61, pulse 76, temperature 98.3 F (36.8 C), temperature source Temporal, resp. rate 16, height 5\' 11"  (1.803 m), weight 239 lb 6.4 oz (108.6 kg), SpO2 96 %. Gen: Alert, well appearing.  Patient is oriented to person, place, time, and situation. AFFECT: pleasant, lucid thought and speech. CV: RRR, no m/r/g.   LUNGS: CTA bilat, nonlabored resps, good aeration in all lung fields. EXT: no clubbing or cyanosis.  no edema.    LABS:  Lab Results  Component Value Date   HGBA1C 10.1 (H)  11/29/2018     Chemistry      Component Value Date/Time   NA 138 11/29/2018 0957   NA 139 12/28/2016 1405   K 4.5 11/29/2018 0957  CL 103 11/29/2018 0957   CO2 23 11/29/2018 0957   BUN 33 (H) 11/29/2018 0957   BUN 25 12/28/2016 1405   CREATININE 1.55 (H) 11/29/2018 0957   CREATININE 1.21 (H) 06/06/2016 1312      Component Value Date/Time   CALCIUM 8.9 11/29/2018 0957   ALKPHOS 80 11/29/2018 0957   AST 14 11/29/2018 0957   ALT 12 11/29/2018 0957   BILITOT 0.5 11/29/2018 0957     Lab Results  Component Value Date   WBC 5.2 11/29/2018   HGB 16.4 11/29/2018   HCT 48.5 11/29/2018   MCV 90.0 11/29/2018   PLT 140.0 (L) 11/29/2018   Lab Results  Component Value Date   TSH 2.963 04/04/2018    IMPRESSION AND PLAN:  1) DM 2, control improving as per home glucose measurements. He has been much more compliant with diet and meds since last f/u visit. Check A1c and BMET today. He has psych hurdle with insulin use, so will try to avoid inc in insulin if we can and will cut back dose if much better control with consistently improved diet. Can increase jardiance to 20 or 25mg  qd in future if needed.  Of note, pt's wt here today is 239 lbs 6 oz, but he states that his wt on home scale is consistently 230 +/- 1 lb.  An After Visit Summary was printed and given to the patient.  FOLLOW UP: Return in about 3 months (around 05/29/2019) for routine chronic illness f/u.  Signed:  Crissie Sickles, MD           02/26/2019

## 2019-03-03 ENCOUNTER — Telehealth: Payer: Self-pay | Admitting: Family Medicine

## 2019-03-03 ENCOUNTER — Other Ambulatory Visit: Payer: Self-pay

## 2019-03-03 MED ORDER — GLUCOSE BLOOD VI STRP: ORAL_STRIP | 3 refills | Status: DC

## 2019-03-03 NOTE — Telephone Encounter (Signed)
Refill sent for strips. Left detailed message on cell advising pt, okay per DPR.

## 2019-03-03 NOTE — Telephone Encounter (Signed)
Please send in Rx Free Style test strips sent to [facility]. Patient will run out 2-3 days.

## 2019-03-04 ENCOUNTER — Other Ambulatory Visit: Payer: Self-pay

## 2019-03-04 MED ORDER — "EASY COMFORT INSULIN SYRINGE 32G X 5/16"" 1 ML MISC": 1.0000 | Freq: Every day | 2 refills | Status: DC

## 2019-03-04 MED ORDER — INSULIN GLARGINE 100 UNIT/ML ~~LOC~~ SOLN: SUBCUTANEOUS | 6 refills | Status: DC

## 2019-03-04 NOTE — Telephone Encounter (Signed)
Patient was contacted and needed refills for Lantus and insulin syringe needles as well. RF's sent to [facility]

## 2019-03-18 ENCOUNTER — Other Ambulatory Visit: Payer: Self-pay | Admitting: Family Medicine

## 2019-03-18 ENCOUNTER — Encounter: Payer: Self-pay | Admitting: Family Medicine

## 2019-03-18 ENCOUNTER — Ambulatory Visit (INDEPENDENT_AMBULATORY_CARE_PROVIDER_SITE_OTHER): Payer: Medicare Other | Admitting: Family Medicine

## 2019-03-18 ENCOUNTER — Other Ambulatory Visit: Payer: Self-pay

## 2019-03-18 VITALS — BP 120/80 | HR 58 | Temp 97.4°F | Ht 71.0 in | Wt 237.1 lb

## 2019-03-18 DIAGNOSIS — R109 Unspecified abdominal pain: Secondary | ICD-10-CM | POA: Diagnosis not present

## 2019-03-18 NOTE — Progress Notes (Signed)
Musculoskeletal Exam  Patient: Mason [name] DOB: March 29, 1941  DOS: 03/18/2019  SUBJECTIVE:  Chief Complaint:   Chief Complaint  Patient presents with  . Back Problem    [name] is a 78 y.o.  male for evaluation and treatment of lower left side pain.   Onset:  7 months ago. Fell into 4x4 piece of wood. Location: lower posterior ribcage  Character:  pressure  Progression of issue:  is unchanged Associated symptoms: Hurts when he takes a deep breath only; no swelling, redness, bruising Treatment: to date has been rest.   Neurovascular symptoms: no  ROS: Musculoskeletal/Extremities: +side pain  Past Medical History:  Diagnosis Date  . AICD (automatic cardioverter/defibrillator) present 2018   MDT CRT-D.  Fatigue-->completely pacer dependent.  Pacer settings adjusted 12/2017 to allow more chronotrophc variance with ADL's//exertion.  . Arthritis    Pt is s/p eft reverse total shoulder arthroplasty.  . Balanitis    chronic fungal  . BPH (benign prostatic hypertrophy) 06/2011   Irritative sx's; pt declined trial of anticholinergic per Urology records  . Chronic combined systolic and diastolic heart failure ([place]) 05/31/2012   Nonischemic:  EF 40-45%, LA mod-severe dilated, AFIB.   02/2016 EF 40%, diffuse hypokinesis, grade 2 DD.  Myoc perf imaging showed EF 32% 04/2016.  Pt upgraded to CRT-D 01/04/17.  [name] Chronic renal insufficiency, stage III (moderate) (HCC) 2015   CrCl about 60 ml/min  . Complete heart block (HCC)    Has dual chamber pacer.  . Depression   . DOE (dyspnea on exertion)    NYHA class II/III CHF  . Episodic low back pain 01/22/2013   w/intermittent radiculitis (12/2014 his neurologist referred him to pain mgmt for epidural steroid injection)  . Erectile dysfunction 2019   due to zoloft--urol rx'd viagra  . GERD (gastroesophageal reflux disease)   . H/O tilt table evaluation 11/02/05   negative  . Helicobacter pylori gastritis 01/2016  . History of adenomatous  polyp of colon 10/12/11   Dr. [name] (3 right side of colon- tubular adenomas removed)  . History of cardiovascular stress test 05/28/12   no ischemia, EF 37%, imaging results are unchanged and within normal variance  . History of chronic prostatitis   . History of kidney stones   . History of vertigo    + Hx of posterior HA's.  Neuro (Dr. Erling Cruz) eval 2011.  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  . Hyperlipidemia   . Hypertension   . Lumbar spondylosis    lumbosacral radiculopathy at L4 by EMG testing, right foot drop (neurologist is Dr. Linus Salmons with [facility] in [place])--neurologist referred him to neurosurgery  . Migraine    "used to have them all the time; none for years" (01/04/2017)  . Myocardial infarction [facility]) ?1970s   not entirely certain of this  . Nephrolithiasis 07/2012   Left UVJ 2 mm stone with dilation of renal collecting system and slight hydroureter on right  . Neuropathy   . NICM (nonischemic cardiomyopathy) ([place])    a. 02/2018 Cath: LM nl, LAD min irregs, LCX no, RCA 20d. [place]:[id number]. Fick CO/CI 4.4/2.0.  . 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  Since no CAD on cath 05/2016, cards recommends upgrade to CRT-D.  [name] Permanent atrial fibrillation    DCCV 07/09/13-converted, lasted two days, then back into afib--needs lifetime anticoagulation (Xarelto as of 09/2014)  . Prostate cancer screening  09/2017   done by urol annually (normal prostate exam documented + PSA 0.84 as of 10/01/17 urol f/u.  10/2018 urol f/u PSA 0.6, no prostate nodule.  . Rectus diastasis   . Right ankle sprain 08/2017   w/distal fibula avulsion fx noted on u/s but not plain film-(Dr. Hudnall).  . Skin cancer of arm, left    "burned it off" (01/04/2017)  . TIA (transient ischemic attack)    L face and L arm weakness. Peri procedural->a. 03/22/2018 following cath. CT  head neg. No MRI b/c has pacer. Likely due to embolus to distal branch of RMCA  . Type II diabetes mellitus (HCC)     Objective: VITAL SIGNS: BP 120/80 (BP Location: Left Arm, Patient Position: Sitting, Cuff Size: Large)   Pulse (!) 58   Temp (!) 97.4 F (36.3 C) (Temporal)   Ht 5\' 11"  (1.803 m)   Wt 237 lb 2 oz (107.6 kg)   SpO2 93%   BMI 33.07 kg/m  Constitutional: Well formed, well developed. No acute distress. Cardiovascular: Brisk cap refill Thorax & Lungs: No accessory muscle use Musculoskeletal: L side/back.   Tenderness to palpation: yes over L lower thor parasp msc, ES group and intercostals around R8-11 on L Deformity: no Ecchymosis: no No erythema, crepitus, excessive warmth, fluctuance Neurologic: Normal sensory function. No focal deficits noted. DTR's equal and symmetric in UE's. No clonus. Psychiatric: Normal mood. Age appropriate judgment and insight. Alert & oriented x 3.    Assessment:  Side pain  Plan: Heat, stretches/exercises. If no improvement, would rec PT after 3-4 weeks.  F/u prn. The patient voiced understanding and agreement to the plan.   [place], DO 03/18/19  9:53 AM

## 2019-03-18 NOTE — Patient Instructions (Signed)
Heat (pad or rice pillow in microwave) over affected area, 10-15 minutes twice daily.   If things do not turn the corner in the next 3-4 weeks, send me a MyChart message and we will get you set up with PT.   Mid-Back Strain Rehab It is normal to feel mild stretching, pulling, tightness, or discomfort as you do these exercises, but you should stop right away if you feel sudden pain or your pain gets worse.  Stretching and range of motion exercises This exercise warms up your muscles and joints and improves the movement and flexibility of your back and shoulders. This exercise also help to relieve pain. Exercise A: Chest and spine stretch  1. Lie down on your back on a firm surface. 2. Roll a towel or a small blanket so it is about 4 inches (10 cm) in diameter. 3. Put the towel lengthwise under the middle of your back so it is under your spine, but not under your shoulder blades. 4. To increase the stretch, you may put your hands behind your head and let your elbows fall to your sides. 5. Hold for 30 seconds. Repeat exercise 2 times. Complete this exercise 3 times per week. Strengthening exercises These exercises build strength and endurance in your back and your shoulder blade muscles. Endurance is the ability to use your muscles for a long time, even after they get tired. Exercise C: Straight arm rows (shoulder extension) 1. Stand with your feet shoulder width apart. 2. Secure an exercise band to a stable object in front of you so the band is at or above shoulder height. 3. Hold one end of the exercise band in each hand. 4. Straighten your elbows and lift your hands up to shoulder height. 5. Step back, away from the secured end of the exercise band, until the band stretches. 6. Squeeze your shoulder blades together and pull your hands down to the sides of your thighs. Stop when your hands are straight down by your sides. Do not let your hands go behind your body. 7. Hold for 2  seconds. 8. Slowly return to the starting position. Repeat 2 times. Complete this exercise 3 times per week. Exercise D: Shoulder external rotation, prone 1. Lie on your abdomen on a firm bed so your left / right forearm hangs over the edge of the bed and your upper arm is on the bed, straight out from your body. ? Your elbow should be bent. ? Your palm should be facing your feet. 2. If instructed, hold a 2-5 lb weight in your hand. 3. Squeeze your shoulder blade toward the middle of your back. Do not let your shoulder lift toward your ear. 4. Keep your elbow bent in an "L" shape (90 degrees) while you slowly move your forearm up toward the ceiling. Move your forearm up to the height of the bed, toward your head. ? Your upper arm should not move. ? At the top of the movement, your palm should face the floor. 5. Hold for 1 second. 6. Slowly return to the starting position and relax your muscles. Repeat 2 times. Complete this exercise 3 times per week. Exercise E: Scapular retraction and external rotation, rowing  1. Sit in a stable chair without armrests, or stand. 2. Secure an exercise band to a stable object in front of you so it is at shoulder height. 3. Hold one end of the exercise band in each hand. 4. Bring your arms out straight in front of you.  5. Step back, away from the secured end of the exercise band, until the band stretches. 6. Pull the band backward. As you do this, bend your elbows and squeeze your shoulder blades together, but avoid letting the rest of your body move. Do not let your shoulders lift up toward your ears. 7. Stop when your elbows are at your sides or slightly behind your body. 8. Hold for 1 second1. 9. Slowly straighten your arms to return to the starting position. Repeat 2 times. Complete this exercise 3 times per week.  EXERCISES  RANGE OF MOTION (ROM) AND STRETCHING EXERCISES - Low Back Pain Most people with lower back pain will find that their symptoms  get worse with excessive bending forward (flexion) or arching at the lower back (extension). The exercises that will help resolve your symptoms will focus on the opposite motion.  If you have pain, numbness or tingling which travels down into your buttocks, leg or foot, the goal of the therapy is for these symptoms to move closer to your back and eventually resolve. Sometimes, these leg symptoms will get better, but your lower back pain may worsen. This is often an indication of progress in your rehabilitation. Be very alert to any changes in your symptoms and the activities in which you participated in the 24 hours prior to the change. Sharing this information with your caregiver will allow him or her to most efficiently treat your condition. These exercises may help you when beginning to rehabilitate your injury. Your symptoms may resolve with or without further involvement from your physician, physical therapist or athletic trainer. While completing these exercises, remember:   Restoring tissue flexibility helps normal motion to return to the joints. This allows healthier, less painful movement and activity.  An effective stretch should be held for at least 30 seconds.  A stretch should never be painful. You should only feel a gentle lengthening or release in the stretched tissue. FLEXION RANGE OF MOTION AND STRETCHING EXERCISES:  STRETCH - Flexion, Single Knee to Chest   Lie on a firm bed or floor with both legs extended in front of you.  Keeping one leg in contact with the floor, bring your opposite knee to your chest. Hold your leg in place by either grabbing behind your thigh or at your knee.  Pull until you feel a gentle stretch in your low back. Hold 30 seconds.  Slowly release your grasp and repeat the exercise with the opposite side. Repeat 2 times. Complete this exercise 3 times per week.   STRETCH - Flexion, Double Knee to Chest  Lie on a firm bed or floor with both legs extended  in front of you.  Keeping one leg in contact with the floor, bring your opposite knee to your chest.  Tense your stomach muscles to support your back and then lift your other knee to your chest. Hold your legs in place by either grabbing behind your thighs or at your knees.  Pull both knees toward your chest until you feel a gentle stretch in your low back. Hold 30 seconds.  Tense your stomach muscles and slowly return one leg at a time to the floor. Repeat 2 times. Complete this exercise 3 times per week.   STRETCH - Low Trunk Rotation  Lie on a firm bed or floor. Keeping your legs in front of you, bend your knees so they are both pointed toward the ceiling and your feet are flat on the floor.  Extend your arms  out to the side. This will stabilize your upper body by keeping your shoulders in contact with the floor.  Gently and slowly drop both knees together to one side until you feel a gentle stretch in your low back. Hold for 30 seconds.  Tense your stomach muscles to support your lower back as you bring your knees back to the starting position. Repeat the exercise to the other side. Repeat 2 times. Complete this exercise at least 3 times per week.   EXTENSION RANGE OF MOTION AND FLEXIBILITY EXERCISES:  STRETCH - Extension, Prone on Elbows   Lie on your stomach on the floor, a bed will be too soft. Place your palms about shoulder width apart and at the height of your head.  Place your elbows under your shoulders. If this is too painful, stack pillows under your chest.  Allow your body to relax so that your hips drop lower and make contact more completely with the floor.  Hold this position for 30 seconds.  Slowly return to lying flat on the floor. Repeat 2 times. Complete this exercise 3 times per week.   RANGE OF MOTION - Extension, Prone Press Ups  Lie on your stomach on the floor, a bed will be too soft. Place your palms about shoulder width apart and at the height of  your head.  Keeping your back as relaxed as possible, slowly straighten your elbows while keeping your hips on the floor. You may adjust the placement of your hands to maximize your comfort. As you gain motion, your hands will come more underneath your shoulders.  Hold this position 30 seconds.  Slowly return to lying flat on the floor. Repeat 2 times. Complete this exercise 3 times per week.   RANGE OF MOTION- Quadruped, Neutral Spine   Assume a hands and knees position on a firm surface. Keep your hands under your shoulders and your knees under your hips. You may place padding under your knees for comfort.  Drop your head and point your tailbone toward the ground below you. This will round out your lower back like an angry cat. Hold this position for 30 seconds.  Slowly lift your head and release your tail bone so that your back sags into a large arch, like an old horse.  Hold this position for 30 seconds.  Repeat this until you feel limber in your low back.  Now, find your "sweet spot." This will be the most comfortable position somewhere between the two previous positions. This is your neutral spine. Once you have found this position, tense your stomach muscles to support your low back.  Hold this position for 30 seconds. Repeat 2 times. Complete this exercise 3 times per week.   STRENGTHENING EXERCISES - Low Back Sprain These exercises may help you when beginning to rehabilitate your injury. These exercises should be done near your "sweet spot." This is the neutral, low-back arch, somewhere between fully rounded and fully arched, that is your least painful position. When performed in this safe range of motion, these exercises can be used for people who have either a flexion or extension based injury. These exercises may resolve your symptoms with or without further involvement from your physician, physical therapist or athletic trainer. While completing these exercises, remember:    Muscles can gain both the endurance and the strength needed for everyday activities through controlled exercises.  Complete these exercises as instructed by your physician, physical therapist or athletic trainer. Increase the resistance and repetitions only  as guided.  You may experience muscle soreness or fatigue, but the pain or discomfort you are trying to eliminate should never worsen during these exercises. If this pain does worsen, stop and make certain you are following the directions exactly. If the pain is still present after adjustments, discontinue the exercise until you can discuss the trouble with your caregiver.  STRENGTHENING - Deep Abdominals, Pelvic Tilt   Lie on a firm bed or floor. Keeping your legs in front of you, bend your knees so they are both pointed toward the ceiling and your feet are flat on the floor.  Tense your lower abdominal muscles to press your low back into the floor. This motion will rotate your pelvis so that your tail bone is scooping upwards rather than pointing at your feet or into the floor. With a gentle tension and even breathing, hold this position for 3 seconds. Repeat 2 times. Complete this exercise 3 times per week.   STRENGTHENING - Abdominals, Crunches   Lie on a firm bed or floor. Keeping your legs in front of you, bend your knees so they are both pointed toward the ceiling and your feet are flat on the floor. Cross your arms over your chest.  Slightly tip your chin down without bending your neck.  Tense your abdominals and slowly lift your trunk high enough to just clear your shoulder blades. Lifting higher can put excessive stress on the lower back and does not further strengthen your abdominal muscles.  Control your return to the starting position. Repeat 2 times. Complete this exercise 3 times per week.   STRENGTHENING - Quadruped, Opposite UE/LE Lift   Assume a hands and knees position on a firm surface. Keep your hands under your  shoulders and your knees under your hips. You may place padding under your knees for comfort.  Find your neutral spine and gently tense your abdominal muscles so that you can maintain this position. Your shoulders and hips should form a rectangle that is parallel with the floor and is not twisted.  Keeping your trunk steady, lift your right hand no higher than your shoulder and then your left leg no higher than your hip. Make sure you are not holding your breath. Hold this position for 30 seconds.  Continuing to keep your abdominal muscles tense and your back steady, slowly return to your starting position. Repeat with the opposite arm and leg. Repeat 2 times. Complete this exercise 3 times per week.   STRENGTHENING - Abdominals and Quadriceps, Straight Leg Raise   Lie on a firm bed or floor with both legs extended in front of you.  Keeping one leg in contact with the floor, bend the other knee so that your foot can rest flat on the floor.  Find your neutral spine, and tense your abdominal muscles to maintain your spinal position throughout the exercise.  Slowly lift your straight leg off the floor about 6 inches for a count of 3, making sure to not hold your breath.  Still keeping your neutral spine, slowly lower your leg all the way to the floor. Repeat this exercise with each leg 2 times. Complete this exercise 3 times per week.  POSTURE AND BODY MECHANICS CONSIDERATIONS - Low Back Sprain Keeping correct posture when sitting, standing or completing your activities will reduce the stress put on different body tissues, allowing injured tissues a chance to heal and limiting painful experiences. The following are general guidelines for improved posture.  While reading these  guidelines, remember:  The exercises prescribed by your provider will help you have the flexibility and strength to maintain correct postures.  The correct posture provides the best environment for your joints to work.  All of your joints have less wear and tear when properly supported by a spine with good posture. This means you will experience a healthier, less painful body.  Correct posture must be practiced with all of your activities, especially prolonged sitting and standing. Correct posture is as important when doing repetitive low-stress activities (typing) as it is when doing a single heavy-load activity (lifting).  RESTING POSITIONS Consider which positions are most painful for you when choosing a resting position. If you have pain with flexion-based activities (sitting, bending, stooping, squatting), choose a position that allows you to rest in a less flexed posture. You would want to avoid curling into a fetal position on your side. If your pain worsens with extension-based activities (prolonged standing, working overhead), avoid resting in an extended position such as sleeping on your stomach. Most people will find more comfort when they rest with their spine in a more neutral position, neither too rounded nor too arched. Lying on a non-sagging bed on your side with a pillow between your knees, or on your back with a pillow under your knees will often provide some relief. Keep in mind, being in any one position for a prolonged period of time, no matter how correct your posture, can still lead to stiffness.  PROPER SITTING POSTURE In order to minimize stress and discomfort on your spine, you must sit with correct posture. Sitting with good posture should be effortless for a healthy body. Returning to good posture is a gradual process. Many people can work toward this most comfortably by using various supports until they have the flexibility and strength to maintain this posture on their own. When sitting with proper posture, your ears will fall over your shoulders and your shoulders will fall over your hips. You should use the back of the chair to support your upper back. Your lower back will be in a neutral  position, just slightly arched. You may place a small pillow or folded towel at the base of your lower back for  support.  When working at a desk, create an environment that supports good, upright posture. Without extra support, muscles tire, which leads to excessive strain on joints and other tissues. Keep these recommendations in mind:  CHAIR:  A chair should be able to slide under your desk when your back makes contact with the back of the chair. This allows you to work closely.  The chair's height should allow your eyes to be level with the upper part of your monitor and your hands to be slightly lower than your elbows.  BODY POSITION  Your feet should make contact with the floor. If this is not possible, use a foot rest.  Keep your ears over your shoulders. This will reduce stress on your neck and low back.  INCORRECT SITTING POSTURES  If you are feeling tired and unable to assume a healthy sitting posture, do not slouch or slump. This puts excessive strain on your back tissues, causing more damage and pain. Healthier options include:  Using more support, like a lumbar pillow.  Switching tasks to something that requires you to be upright or walking.  Talking a brief walk.  Lying down to rest in a neutral-spine position.  PROLONGED STANDING WHILE SLIGHTLY LEANING FORWARD  When completing a task that  requires you to lean forward while standing in one place for a long time, place either foot up on a stationary 2-4 inch high object to help maintain the best posture. When both feet are on the ground, the lower back tends to lose its slight inward curve. If this curve flattens (or becomes too large), then the back and your other joints will experience too much stress, tire more quickly, and can cause pain.  CORRECT STANDING POSTURES Proper standing posture should be assumed with all daily activities, even if they only take a few moments, like when brushing your teeth. As in sitting,  your ears should fall over your shoulders and your shoulders should fall over your hips. You should keep a slight tension in your abdominal muscles to brace your spine. Your tailbone should point down to the ground, not behind your body, resulting in an over-extended swayback posture.   INCORRECT STANDING POSTURES  Common incorrect standing postures include a forward head, locked knees and/or an excessive swayback. WALKING Walk with an upright posture. Your ears, shoulders and hips should all line-up.  PROLONGED ACTIVITY IN A FLEXED POSITION When completing a task that requires you to bend forward at your waist or lean over a low surface, try to find a way to stabilize 3 out of 4 of your limbs. You can place a hand or elbow on your thigh or rest a knee on the surface you are reaching across. This will provide you more stability, so that your muscles do not tire as quickly. By keeping your knees relaxed, or slightly bent, you will also reduce stress across your lower back. CORRECT LIFTING TECHNIQUES  DO :  Assume a wide stance. This will provide you more stability and the opportunity to get as close as possible to the object which you are lifting.  Tense your abdominals to brace your spine. Bend at the knees and hips. Keeping your back locked in a neutral-spine position, lift using your leg muscles. Lift with your legs, keeping your back straight.  Test the weight of unknown objects before attempting to lift them.  Try to keep your elbows locked down at your sides in order get the best strength from your shoulders when carrying an object.     Always ask for help when lifting heavy or awkward objects. INCORRECT LIFTING TECHNIQUES DO NOT:   Lock your knees when lifting, even if it is a small object.  Bend and twist. Pivot at your feet or move your feet when needing to change directions.  Assume that you can safely pick up even a paperclip without proper posture.

## 2019-03-25 ENCOUNTER — Other Ambulatory Visit: Payer: Self-pay

## 2019-03-25 MED ORDER — JARDIANCE 10 MG PO TABS: 10.0000 mg | ORAL_TABLET | Freq: Every day | ORAL | 1 refills | Status: DC

## 2019-03-27 DIAGNOSIS — H278 Other specified disorders of lens: Secondary | ICD-10-CM | POA: Diagnosis not present

## 2019-03-27 DIAGNOSIS — Z8669 Personal history of other diseases of the nervous system and sense organs: Secondary | ICD-10-CM | POA: Diagnosis not present

## 2019-03-27 DIAGNOSIS — H43811 Vitreous degeneration, right eye: Secondary | ICD-10-CM | POA: Diagnosis not present

## 2019-03-27 DIAGNOSIS — H4312 Vitreous hemorrhage, left eye: Secondary | ICD-10-CM | POA: Diagnosis not present

## 2019-03-27 DIAGNOSIS — T8529XA Other mechanical complication of intraocular lens, initial encounter: Secondary | ICD-10-CM | POA: Diagnosis not present

## 2019-03-28 ENCOUNTER — Other Ambulatory Visit (HOSPITAL_COMMUNITY): Payer: Self-pay

## 2019-04-02 ENCOUNTER — Other Ambulatory Visit (HOSPITAL_COMMUNITY): Payer: Self-pay

## 2019-04-03 ENCOUNTER — Other Ambulatory Visit: Payer: Self-pay

## 2019-04-03 ENCOUNTER — Ambulatory Visit (HOSPITAL_BASED_OUTPATIENT_CLINIC_OR_DEPARTMENT_OTHER)
Admission: RE | Admit: 2019-04-03 | Discharge: 2019-04-03 | Disposition: A | Discharge status: Home / Self Care | Payer: Medicare Other | Source: Ambulatory Visit | Attending: Internal Medicine | Admitting: Internal Medicine

## 2019-04-03 ENCOUNTER — Ambulatory Visit (INDEPENDENT_AMBULATORY_CARE_PROVIDER_SITE_OTHER): Payer: Medicare Other | Admitting: Internal Medicine

## 2019-04-03 ENCOUNTER — Encounter: Payer: Self-pay | Admitting: Internal Medicine

## 2019-04-03 VITALS — BP 114/76 | HR 91 | Temp 97.8°F | Resp 16 | Ht 71.0 in | Wt 241.5 lb

## 2019-04-03 DIAGNOSIS — R109 Unspecified abdominal pain: Secondary | ICD-10-CM | POA: Insufficient documentation

## 2019-04-03 DIAGNOSIS — Z9889 Other specified postprocedural states: Secondary | ICD-10-CM | POA: Diagnosis not present

## 2019-04-03 DIAGNOSIS — H0102A Squamous blepharitis right eye, upper and lower eyelids: Secondary | ICD-10-CM | POA: Diagnosis not present

## 2019-04-03 DIAGNOSIS — R0789 Other chest pain: Secondary | ICD-10-CM | POA: Diagnosis not present

## 2019-04-03 DIAGNOSIS — H35372 Puckering of macula, left eye: Secondary | ICD-10-CM | POA: Diagnosis not present

## 2019-04-03 DIAGNOSIS — H0102B Squamous blepharitis left eye, upper and lower eyelids: Secondary | ICD-10-CM | POA: Diagnosis not present

## 2019-04-03 DIAGNOSIS — E119 Type 2 diabetes mellitus without complications: Secondary | ICD-10-CM | POA: Diagnosis not present

## 2019-04-03 DIAGNOSIS — H401112 Primary open-angle glaucoma, right eye, moderate stage: Secondary | ICD-10-CM | POA: Diagnosis not present

## 2019-04-03 DIAGNOSIS — R079 Chest pain, unspecified: Secondary | ICD-10-CM | POA: Diagnosis not present

## 2019-04-03 DIAGNOSIS — R0781 Pleurodynia: Secondary | ICD-10-CM | POA: Diagnosis not present

## 2019-04-03 DIAGNOSIS — H401123 Primary open-angle glaucoma, left eye, severe stage: Secondary | ICD-10-CM | POA: Diagnosis not present

## 2019-04-03 DIAGNOSIS — Z961 Presence of intraocular lens: Secondary | ICD-10-CM | POA: Diagnosis not present

## 2019-04-03 LAB — HM DIABETES EYE EXAM

## 2019-04-03 MED ORDER — LIDOCAINE 4 % EX PTCH: 1.0000 | MEDICATED_PATCH | Freq: Three times a day (TID) | CUTANEOUS | 1 refills | Status: DC | PRN

## 2019-04-03 NOTE — Progress Notes (Signed)
Subjective:    Patient ID: Mason [name], male    DOB: [birth year], 78 y.o.   MRN: [id number]  DOS:  04/03/2019 Type of visit - description: Acute visit The patient sustained a fall 7 to 8 months ago, since then, he is hurting at the left side of his chest. The pain is daily, increased with certain movements for instance when he rolls over in bed. At the time of the accident, there was no hematoma, bleeding or deformity.  Seen at this office 03/18/2019  He was recommended heat, stretching exercises and possibly PT.  He is here because he is not any better.  Review of Systems Denies SS chest pain; has mild shortness of breath, no far from baseline No nausea, vomiting, diarrhea. No abdominal pain per se, no blood in the stools. No cough   Past Medical History:  Diagnosis Date  . AICD (automatic cardioverter/defibrillator) present 2018   MDT CRT-D.  Fatigue-->completely pacer dependent.  Pacer settings adjusted 12/2017 to allow more chronotrophc variance with ADL's//exertion.  . Arthritis    Pt is s/p eft reverse total shoulder arthroplasty.  . Balanitis    chronic fungal  . BPH (benign prostatic hypertrophy) 06/2011   Irritative sx's; pt declined trial of anticholinergic per Urology records  . Chronic combined systolic and diastolic heart failure ([place]) 05/31/2012   Nonischemic:  EF 40-45%, LA mod-severe dilated, AFIB.   02/2016 EF 40%, diffuse hypokinesis, grade 2 DD.  Myoc perf imaging showed EF 32% 04/2016.  Pt upgraded to CRT-D 01/04/17.  [name] Chronic renal insufficiency, stage III (moderate) (HCC) 2015   CrCl about 60 ml/min  . Complete heart block (HCC)    Has dual chamber pacer.  . Depression   . DOE (dyspnea on exertion)    NYHA class II/III CHF  . Episodic low back pain 01/22/2013   w/intermittent radiculitis (12/2014 his neurologist referred him to pain mgmt for epidural steroid injection)  . Erectile dysfunction 2019   due to zoloft--urol rx'd viagra  . GERD  (gastroesophageal reflux disease)   . H/O tilt table evaluation 11/02/05   negative  . Helicobacter pylori gastritis 01/2016  . History of adenomatous polyp of colon 10/12/11   Dr. [name] (3 right side of colon- tubular adenomas removed)  . History of cardiovascular stress test 05/28/12   no ischemia, EF 37%, imaging results are unchanged and within normal variance  . History of chronic prostatitis   . History of kidney stones   . History of vertigo    + Hx of posterior HA's.  Neuro (Dr. Erling Cruz) eval 2011.  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  . Hyperlipidemia   . Hypertension   . Lumbar spondylosis    lumbosacral radiculopathy at L4 by EMG testing, right foot drop (neurologist is Dr. Linus Salmons with [facility] in [place])--neurologist referred him to neurosurgery  . Migraine    "used to have them all the time; none for years" (01/04/2017)  . Myocardial infarction [facility]) ?1970s   not entirely certain of this  . Nephrolithiasis 07/2012   Left UVJ 2 mm stone with dilation of renal collecting system and slight hydroureter on right  . Neuropathy   . NICM (nonischemic cardiomyopathy) ([place])    a. 02/2018 Cath: LM nl, LAD min irregs, LCX no, RCA 20d. [place]:[id number]. Fick CO/CI 4.4/2.0.  . 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  Since no CAD  on cath 05/2016, cards recommends upgrade to CRT-D.  [name] Permanent atrial fibrillation    DCCV 07/09/13-converted, lasted two days, then back into afib--needs lifetime anticoagulation (Xarelto as of 09/2014)  . Prostate cancer screening 09/2017   done by urol annually (normal prostate exam documented + PSA 0.84 as of 10/01/17 urol f/u.  10/2018 urol f/u PSA 0.6, no prostate nodule.  . Rectus diastasis   . Right ankle sprain 08/2017   w/distal fibula avulsion fx noted on u/s but not plain film-(Dr. Hudnall).  . Skin cancer of  arm, left    "burned it off" (01/04/2017)  . TIA (transient ischemic attack)    L face and L arm weakness. Peri procedural->a. 03/22/2018 following cath. CT head neg. No MRI b/c has pacer. Likely due to embolus to distal branch of RMCA  . Type II diabetes mellitus ([place])     Past Surgical History:  Procedure Laterality Date  . ABI's Bilateral 05/21/2018   normal  . BACK SURGERY    . BIV ICD INSERTION CRT-D N/A 01/04/2017   Procedure: BiV ICD ;  Surgeon: Constance Haw, MD;  Location: [facility];  Service: Cardiovascular;  Laterality: N/A;  . CARDIAC CATHETERIZATION N/A 06/14/2016   Minimal nonobstructive dz, EF 25-35%.  Procedure: Left Heart Cath and Coronary Angiography;  Surgeon: Peter M Martinique, MD;  Location: [facility];  Service: Cardiovascular;  Laterality: N/A;  . CARDIOVASCULAR STRESS TEST  2012   2012 nuclear perfusion study: low risk scan; 04/2016 normal myocardial perfusion imaging, EF 32%.  [name] CARDIOVERSION  07/09/2012   Procedure: CARDIOVERSION;  Surgeon: [name], MD;  Location: [facility];  Service: Cardiovascular;  Laterality: N/A;  . CATARACT EXTRACTION W/ INTRAOCULAR LENS IMPLANT & ANTERIOR VITRECTOMY, BILATERAL Bilateral   . COLONOSCOPY W/ POLYPECTOMY  approx 2006; repeated 09/2011   Polyps on 2013 EGD as well, repeat 12/2014  . ESOPHAGOGASTRODUODENOSCOPY  10/18/06   Done due to chronic GERD: Normal, bx showed no barrett's esophagus (Dr. [name])  . FLEXOR TENDON REPAIR Left 10/02/2016   Procedure: LEFT RING FINGER WOUND EXPORATION AND FLEXOR TENDON REPAIR AND NERVE REPAIR;  Surgeon: Milly Jakob, MD;  Location: [facility];  Service: Orthopedics;  Laterality: Left;  . INSERT / REPLACE / REMOVE PACEMAKER  02/05/2012   dual chamber, sinus node dysfunction, sinus arrest, PAF, Medtronic Revo serial#[id number] H: last checked 05/2015  . LUMBAR LAMINECTOMY Left 1976   L4-5  . PACEMAKER REMOVAL  01/04/2017  . PERMANENT PACEMAKER INSERTION N/A 02/05/2012    Procedure: PERMANENT PACEMAKER INSERTION;  Surgeon: [name], MD; Generator Medtronic Revo model [place] serial number [id number] H Laterality: N/A;  . RETINAL DETACHMENT SURGERY Left ~ 1999  . REVERSE SHOULDER ARTHROPLASTY Left 2018   Left shoulder reverse [name] in [place]).  [name] RIGHT/LEFT HEART CATH AND CORONARY ANGIOGRAPHY N/A 03/22/2018   EF 30-35%, no CAD.  Procedure: RIGHT/LEFT HEART CATH AND CORONARY ANGIOGRAPHY;  Surgeon: Jolaine Artist, MD;  Location: [facility];  Service: Cardiovascular;  Laterality: N/A;  . TRANSTHORACIC ECHOCARDIOGRAM  08/25/10; 05/2012; 03/23/16;12/2017   mild asymmetric LVH, normal systolic function, normal diastolic fxn, mild-to-mod mitral regurg, mild aortic valve sclerosis and trace AI, mild aortic root dilatation. 2014 f/u showed EF 40-45%, mod LAE, A FIB.  02/2016 EF 40%, diffuse hypokinesis, grade 2 DD. 12/2017 EF 35-40%,diffuse hypokin,grd III DD, mild MR    Social History   Socioeconomic History  . Marital status: Married    Spouse name: Not on  file  . Number of children: Not on file  . Years of education: Not on file  . Highest education level: Not on file  Occupational History  . Not on file  Social Needs  . Financial resource strain: Not on file  . Food insecurity    Worry: Not on file    Inability: Not on file  . Transportation needs    Medical: Not on file    Non-medical: Not on file  Tobacco Use  . Smoking status: Never Smoker  . Smokeless tobacco: Never Used  Substance and Sexual Activity  . Alcohol use: Yes    Comment: occ  . Drug use: No  . Sexual activity: Not on file  Lifestyle  . Physical activity    Days per week: Not on file    Minutes per session: Not on file  . Stress: Not on file  Relationships  . Social Herbalist on phone: Not on file    Gets together: Not on file    Attends religious service: Not on file    Active member of club or organization: Not on file    Attends meetings  of clubs or organizations: Not on file    Relationship status: Not on file  . Intimate partner violence    Fear of current or ex partner: Not on file    Emotionally abused: Not on file    Physically abused: Not on file    Forced sexual activity: Not on file  Other Topics Concern  . Not on file  Social History Narrative  . Not on file      Allergies as of 04/03/2019   No Known Allergies     Medication List       Accurate as of April 03, 2019  2:26 PM. If you have any questions, ask your nurse or doctor.        acetaminophen 500 MG tablet Commonly known as: TYLENOL Take 1,000 mg by mouth every 6 (six) hours as needed for moderate pain or headache.   colchicine 0.6 MG tablet Take 0.6 mg by mouth daily as needed (for gout).   Combigan 0.2-0.5 % ophthalmic solution Generic drug: brimonidine-timolol Place 1 drop into both eyes at bedtime.   Easy Comfort Insulin Syringe 32G X 5/16" 1 ML Misc Generic drug: Insulin Syringe-Needle U-100 1 each by Does not apply route daily.   Entresto 97-103 MG Generic drug: sacubitril-valsartan TAKE 1 TABLET TWICE A DAY   eplerenone 25 MG tablet Commonly known as: INSPRA Take 1 tablet (25 mg total) by mouth daily.   freestyle lancets Use as instructed   gabapentin 300 MG capsule Commonly known as: NEURONTIN 2 caps po bid   glucose blood test strip Use to check blood sugars 1-2 times daily   hydrocortisone 2.5 % cream APPLY TO SKIN TWICE A DAY   insulin glargine 100 UNIT/ML injection Commonly known as: LANTUS 40 Units SQ qd.  Pt will be titrating his dose periodically.   Jardiance 10 MG Tabs tablet Generic drug: empagliflozin Take 10 mg by mouth daily.   latanoprost 0.005 % ophthalmic solution Commonly known as: XALATAN Place 1 drop into both eyes at bedtime.   meclizine 25 MG tablet Commonly known as: ANTIVERT TAKE 1 TABLET THREE TIMES A DAY AS NEEDED FOR DIZZINESS   NovoLOG FlexPen 100 UNIT/ML FlexPen Generic  drug: insulin aspart 6 U SQ at the time of each meal   omeprazole 40 MG capsule Commonly known  as: PRILOSEC TAKE 1 CAPSULE DAILY   oxybutynin 5 MG 24 hr tablet Commonly known as: DITROPAN-XL TAKE 1 TABLET AT BEDTIME   Pen Needles 31G X 5 MM Misc 1 each by Does not apply route daily.   rOPINIRole 1 MG tablet Commonly known as: REQUIP TAKE 1 TABLET DAILY   sertraline 100 MG tablet Commonly known as: ZOLOFT TAKE 1 TABLET DAILY   simvastatin 20 MG tablet Commonly known as: ZOCOR TAKE 1 TABLET EVERY EVENING   Toprol XL 50 MG 24 hr tablet Generic drug: metoprolol succinate TAKE 1 TABLET DAILY. TAKE WITH OR IMMEDIATELY FOLLOWING A MEAL   Xarelto 20 MG Tabs tablet Generic drug: rivaroxaban TAKE 1 TABLET DAILY WITH SUPPER           Objective:   Physical Exam Abdominal:      BP 114/76 (BP Location: Left Arm, Patient Position: Sitting, Cuff Size: Normal)   Pulse 91   Temp 97.8 F (36.6 C) (Temporal)   Resp 16   Ht 5\' 11"  (1.803 m)   Wt 241 lb 8 oz (109.5 kg)   SpO2 99%   BMI 33.68 kg/m   General:   Well developed, NAD, BMI noted.  HEENT:  Normocephalic . Face symmetric, atraumatic Lungs:  CTA B Normal respiratory effort, no intercostal retractions, no accessory muscle use. Heart: RRR,  no murmur.  Chest wall: see graphic I did notice antalgic posture particularly when he tried to lay down in the examining table. Abdomen:  Not distended, soft, non-tender. No rebound or rigidity.  Specifically no pain or organomegaly at the left upper quadrant Skin: No hematomas or ecchymosis at the chest wall Neurologic:  alert & oriented X3.  Speech normal, gait appropriate. Psych--  Cognition and judgment appear intact.  Cooperative with normal attention span and concentration.  Behavior appropriate. No anxious or depressed appearing.     Assessment     78 year old gentleman, PMH is extensive and includes CRI, HTN, DM, nonischemic cardiomyopathy, anticoagulated,  presents with:  Chest wall pain: Likely related to the injury he sustained several months ago.  Denies any abdominal pain. Since the injury few months ago, his CBC has been stable without anemia. We will proceed with chest x-ray and left rib x-rays series. Also referred to pain management for possible local injections Trial with Lidoderm patches Tylenol is okay, avoid NSAIDs. If pain persist, consider a CT of the chest and abdomen since he is anticoagulated.

## 2019-04-03 NOTE — Patient Instructions (Addendum)
Please go to the first floor to get your x-rays done.  We will let you know the results  For pain, Tylenol is okay.  Please do not take ibuprofen, naproxen or any other anti-inflammatories.  Please try a patch with lidocaine, you can place 1 patch 3 times a day at the area where you are hurting the most.  If that helps, we can refill it.  We are referring you also to a pain management doctor

## 2019-04-03 NOTE — Progress Notes (Signed)
Pre visit review using our clinic review tool, if applicable. No additional management support is needed unless otherwise documented below in the visit note. 

## 2019-04-04 ENCOUNTER — Ambulatory Visit: Payer: Medicare Other | Admitting: Podiatry

## 2019-04-08 ENCOUNTER — Encounter: Payer: Self-pay | Admitting: Family Medicine

## 2019-04-14 ENCOUNTER — Other Ambulatory Visit (HOSPITAL_COMMUNITY): Payer: Self-pay

## 2019-04-14 ENCOUNTER — Ambulatory Visit: Payer: Medicare Other | Admitting: Podiatry

## 2019-04-15 ENCOUNTER — Other Ambulatory Visit: Payer: Self-pay | Admitting: Cardiovascular Disease

## 2019-04-15 NOTE — Telephone Encounter (Signed)
46m 109.5kg Scr 1.63 02/26/19 Lovw/croitoru 09/02/18 ccr 28.9 Pt is requesting a refil at 20mg  xarelto but only qualifies for 15mg  needs a clinician review

## 2019-04-15 NOTE — Telephone Encounter (Signed)
ccr is actually 107.7

## 2019-04-22 ENCOUNTER — Ambulatory Visit: Payer: Medicare Other | Admitting: *Deleted

## 2019-04-28 ENCOUNTER — Ambulatory Visit (INDEPENDENT_AMBULATORY_CARE_PROVIDER_SITE_OTHER): Payer: Medicare Other | Admitting: *Deleted

## 2019-04-28 DIAGNOSIS — I4821 Permanent atrial fibrillation: Secondary | ICD-10-CM

## 2019-04-28 DIAGNOSIS — I428 Other cardiomyopathies: Secondary | ICD-10-CM

## 2019-04-28 LAB — CUP PACEART REMOTE DEVICE CHECK
Battery Remaining Longevity: 60 mo
Battery Voltage: 2.97 V
Brady Statistic AP VP Percent: 0 %
Brady Statistic AP VS Percent: 0 %
Brady Statistic AS VP Percent: 0 %
Brady Statistic AS VS Percent: 0 %
Brady Statistic RA Percent Paced: 0 %
Brady Statistic RV Percent Paced: 99.92 %
Date Time Interrogation Session: 20201005153315
HighPow Impedance: 68 Ohm
Implantable Lead Implant Date: 20130715
Implantable Lead Implant Date: 20180614
Implantable Lead Implant Date: 20180614
Implantable Lead Location: 753858
Implantable Lead Location: 753859
Implantable Lead Location: 753860
Implantable Lead Model: 4598
Implantable Pulse Generator Implant Date: 20180614
Lead Channel Impedance Value: 184.154
Lead Channel Impedance Value: 184.154
Lead Channel Impedance Value: 195.429
Lead Channel Impedance Value: 212.8 Ohm
Lead Channel Impedance Value: 212.8 Ohm
Lead Channel Impedance Value: 342 Ohm
Lead Channel Impedance Value: 361 Ohm
Lead Channel Impedance Value: 399 Ohm
Lead Channel Impedance Value: 399 Ohm
Lead Channel Impedance Value: 456 Ohm
Lead Channel Impedance Value: 475 Ohm
Lead Channel Impedance Value: 513 Ohm
Lead Channel Impedance Value: 532 Ohm
Lead Channel Impedance Value: 646 Ohm
Lead Channel Impedance Value: 646 Ohm
Lead Channel Impedance Value: 665 Ohm
Lead Channel Impedance Value: 703 Ohm
Lead Channel Impedance Value: 722 Ohm
Lead Channel Pacing Threshold Amplitude: 0.5 V
Lead Channel Pacing Threshold Amplitude: 0.5 V
Lead Channel Pacing Threshold Pulse Width: 0.4 ms
Lead Channel Pacing Threshold Pulse Width: 1 ms
Lead Channel Sensing Intrinsic Amplitude: 0.625 mV
Lead Channel Sensing Intrinsic Amplitude: 20.5 mV
Lead Channel Sensing Intrinsic Amplitude: 20.5 mV
Lead Channel Setting Pacing Amplitude: 1 V
Lead Channel Setting Pacing Amplitude: 2 V
Lead Channel Setting Pacing Pulse Width: 0.4 ms
Lead Channel Setting Pacing Pulse Width: 1 ms
Lead Channel Setting Sensing Sensitivity: 0.3 mV

## 2019-05-02 ENCOUNTER — Other Ambulatory Visit: Payer: Self-pay

## 2019-05-05 NOTE — Progress Notes (Signed)
Remote ICD transmission.   

## 2019-05-08 ENCOUNTER — Other Ambulatory Visit: Payer: Self-pay | Admitting: Family Medicine

## 2019-05-19 ENCOUNTER — Encounter: Payer: Self-pay | Admitting: Family Medicine

## 2019-05-19 ENCOUNTER — Ambulatory Visit (INDEPENDENT_AMBULATORY_CARE_PROVIDER_SITE_OTHER): Payer: Medicare Other | Admitting: Family Medicine

## 2019-05-19 ENCOUNTER — Other Ambulatory Visit: Payer: Self-pay

## 2019-05-19 VITALS — BP 96/68 | HR 75 | Temp 98.1°F | Resp 16 | Ht 71.0 in | Wt 238.6 lb

## 2019-05-19 DIAGNOSIS — E118 Type 2 diabetes mellitus with unspecified complications: Secondary | ICD-10-CM | POA: Diagnosis not present

## 2019-05-19 DIAGNOSIS — N183 Chronic kidney disease, stage 3 unspecified: Secondary | ICD-10-CM | POA: Diagnosis not present

## 2019-05-19 DIAGNOSIS — Z23 Encounter for immunization: Secondary | ICD-10-CM | POA: Diagnosis not present

## 2019-05-19 DIAGNOSIS — I10 Essential (primary) hypertension: Secondary | ICD-10-CM | POA: Diagnosis not present

## 2019-05-19 NOTE — Progress Notes (Signed)
OFFICE VISIT  05/19/2019   CC:  Chief Complaint  Patient presents with  . Follow-up    RCI, pt is fasting   HPI:    Patient is a 78 y.o. Caucasian male who presents for f/u DM 2, HTN, CRI III.  He has nonischemic CM (syst and diast dysfxn) and has CRT-D, also chronic a-fib and complete heart block (pacer dependent),andconsistently has NYHA class II/III sx's. Most recent CHF clinic visit was 08/2018:all stable, remains on max medical mgmt + CRT.  A/P for DM as of last visit 3 mo ago->"Poorly controlled DM, noncompliant with lantus at times, noncompliant with DIET is a huge problem for him. His impression is that the insulin has been making his glucoses worse, but he admits he didn't start regularly checking his glucoses until AFTER his A1c was up to 10% and I started him on insulin. Instructions: Choose 2 things about your diet that you can consistently improve. Increase your lantus to 40 Units once per day. Increase your mealtime insulin (Novolog pen) to 6 Units with each meal. Continue to check your blood sugar fasting every morning and also before each meal of the day.  These instructions were explained clearly today more than once AND I had my CMA go over them with him separately."  Interim hx: "Doing fine".  Not exercising at all.  DM 2: morning glucoses range 115-175, says still probs with periods of poor diet. Ate a big mac yesterday.   Occ check later in the day consistently <135. Taking insulins correctly as noted above. Feet: has chronic burning and tingling --comes and goes.  HTN: bp's consistently <120/80.  No hypotension.  CRI III: avoids NSAIDs. Has steady intake of fluids lately, better water intake.    ROS: no CP, no SOB, no wheezing, no cough, no dizziness, no HAs, no rashes, no melena/hematochezia.  No polyuria or polydipsia.  No myalgias or arthralgias.  Past Medical History:  Diagnosis Date  . AICD (automatic cardioverter/defibrillator) present 2018   MDT CRT-D.  Fatigue-->completely pacer dependent.  Pacer settings adjusted 12/2017 to allow more chronotrophc variance with ADL's//exertion.  . Arthritis    Pt is s/p eft reverse total shoulder arthroplasty.  . Balanitis    chronic fungal  . BPH (benign prostatic hypertrophy) 06/2011   Irritative sx's; pt declined trial of anticholinergic per Urology records  . Chronic combined systolic and diastolic heart failure ([place]) 05/31/2012   Nonischemic:  EF 40-45%, LA mod-severe dilated, AFIB.   02/2016 EF 40%, diffuse hypokinesis, grade 2 DD.  Myoc perf imaging showed EF 32% 04/2016.  Pt upgraded to CRT-D 01/04/17.  [name] Chronic renal insufficiency, stage III (moderate) 2015   CrCl about 60 ml/min  . Complete heart block (H[facility])    Has dual chamber pacer.  . Depression   . DOE (dyspnea on exertion)    NYHA class II/III CHF  . Episodic low back pain 01/22/2013   w/intermittent radiculitis (12/2014 his neurologist referred him to pain mgmt for epidural steroid injection)  . Erectile dysfunction 2019   due to zoloft--urol rx'd viagra  . GERD (gastroesophageal reflux disease)   . H/O tilt table evaluation 11/02/05   negative  . Helicobacter pylori gastritis 01/2016  . History of adenomatous polyp of colon 10/12/11   Dr. [name] (3 right side of colon- tubular adenomas removed)  . History of cardiovascular stress test 05/28/12   no ischemia, EF 37%, imaging results are unchanged and within normal variance  . History of chronic prostatitis   .  History of kidney stones   . History of vertigo    + Hx of posterior HA's.  Neuro (Dr. Erling Cruz) eval 2011.  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  . Hyperlipidemia   . Hypertension   . Lumbar spondylosis    lumbosacral radiculopathy at L4 by EMG testing, right foot drop (neurologist is Dr. Linus Salmons with [facility] in [place])--neurologist referred him to neurosurgery  . Migraine     "used to have them all the time; none for years" (01/04/2017)  . Myocardial infarction [facility]) ?1970s   not entirely certain of this  . Nephrolithiasis 07/2012   Left UVJ 2 mm stone with dilation of renal collecting system and slight hydroureter on right  . Neuropathy   . NICM (nonischemic cardiomyopathy) ([place])    a. 02/2018 Cath: LM nl, LAD min irregs, LCX no, RCA 20d. [id number]. Fick CO/CI 4.4/2.0.  . 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  Since no CAD on cath 05/2016, cards recommends upgrade to CRT-D.  [name] Permanent atrial fibrillation ([place])    DCCV 07/09/13-converted, lasted two days, then back into afib--needs lifetime anticoagulation (Xarelto as of 09/2014)  . Prostate cancer screening 09/2017   done by urol annually (normal prostate exam documented + PSA 0.84 as of 10/01/17 urol f/u.  10/2018 urol f/u PSA 0.6, no prostate nodule.  . Rectus diastasis   . Right ankle sprain 08/2017   w/distal fibula avulsion fx noted on u/s but not plain film-(Dr. Hudnall).  . Skin cancer of arm, left    "burned it off" (01/04/2017)  . TIA (transient ischemic attack)    L face and L arm weakness. Peri procedural->a. 03/22/2018 following cath. CT head neg. No MRI b/c has pacer. Likely due to embolus to distal branch of RMCA  . Type II diabetes mellitus ([place])     Past Surgical History:  Procedure Laterality Date  . ABI's Bilateral 05/21/2018   normal  . BACK SURGERY    . BIV ICD INSERTION CRT-D N/A 01/04/2017   Procedure: BiV ICD ;  Surgeon: Constance Haw, MD;  Location: [place] [facility];  Service: Cardiovascular;  Laterality: N/A;  . CARDIAC CATHETERIZATION N/A 06/14/2016   Minimal nonobstructive dz, EF 25-35%.  Procedure: Left Heart Cath and Coronary Angiography;  Surgeon: Peter M Martinique, MD;  Location: [place] [facility];  Service: Cardiovascular;  Laterality: N/A;  . CARDIOVASCULAR STRESS TEST  2012   2012 nuclear perfusion study: low  risk scan; 04/2016 normal myocardial perfusion imaging, EF 32%.  [name] CARDIOVERSION  07/09/2012   Procedure: CARDIOVERSION;  Surgeon: [name], MD;  Location: [place];  Service: Cardiovascular;  Laterality: N/A;  . CATARACT EXTRACTION W/ INTRAOCULAR LENS IMPLANT & ANTERIOR VITRECTOMY, BILATERAL Bilateral   . COLONOSCOPY W/ POLYPECTOMY  approx 2006; repeated 09/2011   Polyps on 2013 EGD as well, repeat 12/2014  . ESOPHAGOGASTRODUODENOSCOPY  10/18/06   Done due to chronic GERD: Normal, bx showed no barrett's esophagus (Dr. [name])  . FLEXOR TENDON REPAIR Left 10/02/2016   Procedure: LEFT RING FINGER WOUND EXPORATION AND FLEXOR TENDON REPAIR AND NERVE REPAIR;  Surgeon: Milly Jakob, MD;  Location: [place];  Service: Orthopedics;  Laterality: Left;  . INSERT / REPLACE / REMOVE PACEMAKER  02/05/2012   dual chamber, sinus node dysfunction, sinus arrest, PAF, Medtronic Revo serial#-[id number] H: last checked 05/2015  . LUMBAR LAMINECTOMY Left 1976   L4-5  .  PACEMAKER REMOVAL  01/04/2017  . PERMANENT PACEMAKER INSERTION N/A 02/05/2012   Procedure: PERMANENT PACEMAKER INSERTION;  Surgeon: [name], MD; Generator Medtronic Revo model [place] serial number [id number] H Laterality: N/A;  . RETINAL DETACHMENT SURGERY Left ~ 1999  . REVERSE SHOULDER ARTHROPLASTY Left 2018   Left shoulder reverse [name] in [place]).  [name] RIGHT/LEFT HEART CATH AND CORONARY ANGIOGRAPHY N/A 03/22/2018   EF 30-35%, no CAD.  Procedure: RIGHT/LEFT HEART CATH AND CORONARY ANGIOGRAPHY;  Surgeon: Jolaine Artist, MD;  Location: [place] [facility];  Service: Cardiovascular;  Laterality: N/A;  . TRANSTHORACIC ECHOCARDIOGRAM  08/25/10; 05/2012; 03/23/16;12/2017   mild asymmetric LVH, normal systolic function, normal diastolic fxn, mild-to-mod mitral regurg, mild aortic valve sclerosis and trace AI, mild aortic root dilatation. 2014 f/u showed EF 40-45%, mod LAE, A FIB.  02/2016 EF 40%, diffuse hypokinesis, grade 2 DD.  12/2017 EF 35-40%,diffuse hypokin,grd III DD, mild MR    Outpatient Medications Prior to Visit  Medication Sig Dispense Refill  . colchicine 0.6 MG tablet Take 0.6 mg by mouth daily as needed (for gout).     . COMBIGAN 0.2-0.5 % ophthalmic solution Place 1 drop into both eyes at bedtime.     . empagliflozin (JARDIANCE) 10 MG TABS tablet Take 10 mg by mouth daily. 90 tablet 1  . ENTRESTO 97-103 MG TAKE 1 TABLET TWICE A DAY 180 tablet 3  . eplerenone (INSPRA) 25 MG tablet Take 1 tablet (25 mg total) by mouth daily. 90 tablet 3  . gabapentin (NEURONTIN) 300 MG capsule 2 caps po bid 360 capsule 3  . glucose blood test strip Use to check blood sugars 1-2 times daily 100 each 3  . insulin aspart (NOVOLOG FLEXPEN) 100 UNIT/ML FlexPen 6 U SQ at the time of each meal 18 mL 6  . insulin glargine (LANTUS) 100 UNIT/ML injection 40 Units SQ qd.  Pt will be titrating his dose periodically. 30 mL 6  . Insulin Pen Needle (PEN NEEDLES) 31G X 5 MM MISC 1 each by Does not apply route daily. 100 each 0  . Insulin Syringe-Needle U-100 (EASY COMFORT INSULIN SYRINGE) 32G X 5/16" 1 ML MISC 1 each by Does not apply route daily. 100 each 2  . Lancets (FREESTYLE) lancets USE AS INSTRUCTED 100 each 11  . latanoprost (XALATAN) 0.005 % ophthalmic solution Place 1 drop into both eyes at bedtime.   12  . meclizine (ANTIVERT) 25 MG tablet TAKE 1 TABLET THREE TIMES A DAY AS NEEDED FOR DIZZINESS 270 tablet 3  . omeprazole (PRILOSEC) 40 MG capsule TAKE 1 CAPSULE DAILY 90 capsule 4  . oxybutynin (DITROPAN-XL) 5 MG 24 hr tablet TAKE 1 TABLET AT BEDTIME 90 tablet 1  . rOPINIRole (REQUIP) 1 MG tablet TAKE 1 TABLET DAILY 90 tablet 3  . sertraline (ZOLOFT) 100 MG tablet TAKE 1 TABLET DAILY 90 tablet 4  . simvastatin (ZOCOR) 20 MG tablet TAKE 1 TABLET EVERY EVENING 90 tablet 1  . TOPROL XL 50 MG 24 hr tablet TAKE 1 TABLET DAILY. TAKE WITH OR IMMEDIATELY FOLLOWING A MEAL 90 tablet 1  . XARELTO 20 MG TABS tablet TAKE 1 TABLET DAILY WITH  SUPPER 90 tablet 3  . acetaminophen (TYLENOL) 500 MG tablet Take 1,000 mg by mouth every 6 (six) hours as needed for moderate pain or headache.    . hydrocortisone 2.5 % cream APPLY TO SKIN TWICE A DAY    . Lidocaine 4 % PTCH Apply 1 patch topically 3 (three)  times daily as needed. 10 patch 1   No facility-administered medications prior to visit.     No Known Allergies  ROS As per HPI  PE: Blood pressure 96/68, pulse 75, temperature 98.1 F (36.7 C), temperature source Temporal, resp. rate 16, height _0  (1.803 m), weight 238 lb 9.6 oz (108.2 kg), SpO2 98 %. Body mass index is 33.28 kg/m.  Gen: Alert, well appearing.  Patient is oriented to person, place, time, and situation. AFFECT: pleasant, lucid thought and speech.  Feet:Foot exam - no swelling, tenderness or skin or vascular lesions. Color and temperature is normal. Sensation absent to monofilament testing on plantar surfaces bilat except 4th and 5th toes on each foot. Peripheral pulses are not palpable. Color pink.  No sign or venous dz. Toenails are hypertrophic and have chronic mycotic changes.   LABS:  Lab Results  Component Value Date   TSH 2.963 04/04/2018   Lab Results  Component Value Date   WBC 5.2 11/29/2018   HGB 16.4 11/29/2018   HCT 48.5 11/29/2018   MCV 90.0 11/29/2018   PLT 140.0 (L) 11/29/2018   Lab Results  Component Value Date   CREATININE 1.63 (H) 02/26/2019   BUN 25 (H) 02/26/2019   NA 138 02/26/2019   K 4.9 02/26/2019   CL 103 02/26/2019   CO2 27 02/26/2019   Lab Results  Component Value Date   ALT 12 11/29/2018   AST 14 11/29/2018   ALKPHOS 80 11/29/2018   BILITOT 0.5 11/29/2018   Lab Results  Component Value Date   CHOL 162 11/29/2018   Lab Results  Component Value Date   HDL 32.70 (L) 11/29/2018   Lab Results  Component Value Date   LDLCALC 73 03/23/2018   Lab Results  Component Value Date   TRIG 267.0 (H) 11/29/2018   Lab Results  Component Value Date   CHOLHDL 5  11/29/2018   Lab Results  Component Value Date   HGBA1C 9.0 (H) 02/26/2019    IMPRESSION AND PLAN:  1) DM 2, control sounds improved, still working on dietary issues. No changes in insulin at this time.  Plan on lab visit for A1c and BMET in 1 wk. Feet: c/w DPN.  Discussed. Cannot feel PT or DP pulses bilat->pt asymptomatic, no hx of LE ulcers. No vascular testing for now.  2) HTN: The current medical regimen is effective;  continue present plan and medications. BMET 1 wk.  3) HLD: tolerating simvastatin 42m qd. Last LDL 5 mo ago was 73.  HDL 33. AST/ALT normsal 5 mo ago. Plan repeat hepatic panel and FLP 6 mo.  4) CRI III: discussed more in depth with pt today. Avoiding NSAIDs.  Careful fluid balance-he seems to be doing better with adequate water intake lately.  5) Cardiac: A-fib/CRT/pacing/CHF/chronic anticoag: stable. Has cardiology f/u.  An After Visit Summary was printed and given to the patient.  FOLLOW UP: No follow-ups on file.  Signed:  PCrissie Sickles MD           05/19/2019

## 2019-05-27 ENCOUNTER — Ambulatory Visit (INDEPENDENT_AMBULATORY_CARE_PROVIDER_SITE_OTHER): Payer: Medicare Other | Admitting: Family Medicine

## 2019-05-27 ENCOUNTER — Other Ambulatory Visit: Payer: Self-pay

## 2019-05-27 DIAGNOSIS — E118 Type 2 diabetes mellitus with unspecified complications: Secondary | ICD-10-CM

## 2019-05-27 DIAGNOSIS — N183 Chronic kidney disease, stage 3 unspecified: Secondary | ICD-10-CM | POA: Diagnosis not present

## 2019-05-27 DIAGNOSIS — I10 Essential (primary) hypertension: Secondary | ICD-10-CM | POA: Diagnosis not present

## 2019-05-27 LAB — BASIC METABOLIC PANEL
BUN: 25 mg/dL — ABNORMAL HIGH (ref 6–23)
CO2: 32 mEq/L (ref 19–32)
Calcium: 9.3 mg/dL (ref 8.4–10.5)
Chloride: 101 mEq/L (ref 96–112)
Creatinine, Ser: 1.57 mg/dL — ABNORMAL HIGH (ref 0.40–1.50)
GFR: 43.07 mL/min — ABNORMAL LOW (ref >60.00)
Glucose, Bld: 120 mg/dL — ABNORMAL HIGH (ref 70–99)
Potassium: 4.1 mEq/L (ref 3.5–5.1)
Sodium: 139 mEq/L (ref 135–145)

## 2019-05-28 LAB — HEMOGLOBIN A1C: Hgb A1c MFr Bld: 9.7 % — ABNORMAL HIGH (ref 4.6–6.5)

## 2019-05-31 ENCOUNTER — Encounter: Payer: Self-pay | Admitting: Family Medicine

## 2019-06-01 NOTE — Progress Notes (Signed)
`   Cardiology Office Note    Date:  06/01/2019   ID:  Mason [name], DOB [birth year], MRN [id number]  PCP:  Tammi Sou, MD  Cardiologist:   [name], MD   No chief complaint on file.   History of Present Illness:  Mason [name] is a 78 y.o. male with nonischemic cardiomyopathy, permanent atrial fibrillation and complete heart block who underwent implantation of a CRT-D in June 2018 for deteriorating left ventricular systolic function.  He is pacemaker dependent.  The patient specifically denies any chest pain at rest exertion, dyspnea at rest or with usual exertion, orthopnea, paroxysmal nocturnal dyspnea, syncope, palpitations, focal neurological deficits, intermittent claudication, lower extremity edema, unexplained weight gain, cough, hemoptysis or wheezing.  He does complain of orthostatic dizziness worse in the morning, polyuria and some difficulty with dyspnea when walking up hills.  His pacemaker shows an average of 4 hours of activity, unchanged over the last 12.  His device is a Medtronic Claria CRT-D device with an estimated longevity of another 4.8 years.  He has 99.8% biventricular pacing.  His OptiVol is stable.  Left ventricular pacing is in the LV 3-RV coil configuration.  There was no escape rhythm detected today when the pacing was taken down to 30 bpm.  He is pacemaker dependent.  He has not had ventricular tachycardia, not even nonsustained events.  He is in permanent atrial fibrillation.  Interrogation of his pacemaker shows adequate, just slightly blunted heart rate histogram distribution.   Lead parameters are excellent.    Switched from spironolactone to eplerenone due to gynecomastia. He has had no bleeding problems and is compliant with Xarelto anticoagulation.  He does not have coronary or other vascular disorders, but has numerous risk factors including type 2 diabetes, hyperlipidemia, mild obesity. He has a history of gout.  He has gained about 20 pounds  since his last appointment.  He is now taking both Lantus and Jardiance.  He is now established as 100% service connected at the [facility] and this should help with his medications, some of which are quite expensive (Xarelto, Jardiance, eplerenone, Lantus).  The cause of Mason [name]'s cardiomyopathy remains uncertain.  He initially presented with a "heart attack" and was in the ICU in the [place] hospital at [facility] for 3 to 4 days in 1976.  He has had a depressed left ventricular systolic function for many years.  Cardiac catheterization most recently performed in 2019 does show nonobstructive atherosclerosis.[name]  He has a Water engineer including 2 stints in [place] and he knows that he was exposed to [employer].  He underwent right and left heart catheterization for optimization of heart failure therapy on March 22, 2018.  Right atrial pressure was 7 mmHg, wedge pressure was 13 and PA pressure was 30/10 (mean 22) millimeters Hg.  The cardiac index was 2.0.  The coronary arteries had only minimal atherosclerosis.  Radial artery access was difficult.  On the catheterization he developed transient weakness of the left upper extremity and left facial droop, from which he recovered fully thankfully.  His dose of beta-blocker was reduced.  He is not on diuretics.  He subsequently underwent cardiopulmonary stress testing on September 18 and did quite well with a mildly reduced peak VO2 of 16.8 mL/kg/min (79% of control).  There was still evidence of drug-induced chronotropic incompetence with a maximum heart rate only reached 74% of predicted.     Past Medical History:  Diagnosis Date  . AICD (  automatic cardioverter/defibrillator) present 2018   MDT CRT-D.  Fatigue-->completely pacer dependent.  Pacer settings adjusted 12/2017 to allow more chronotrophc variance with ADL's//exertion.  . Arthritis    Pt is s/p eft reverse total shoulder arthroplasty.  . Balanitis    chronic fungal  . BPH (benign  prostatic hypertrophy) 06/2011   Irritative sx's; pt declined trial of anticholinergic per Urology records  . Chronic combined systolic and diastolic heart failure ([place]) 05/31/2012   Nonischemic:  EF 40-45%, LA mod-severe dilated, AFIB.   02/2016 EF 40%, diffuse hypokinesis, grade 2 DD.  Myoc perf imaging showed EF 32% 04/2016.  Pt upgraded to CRT-D 01/04/17.  [name] Chronic renal insufficiency, stage III (moderate) 2015   CrCl about 60 ml/min  . Complete heart block (HCC)    Has dual chamber pacer.  . Depression   . DOE (dyspnea on exertion)    NYHA class II/III CHF  . Episodic low back pain 01/22/2013   w/intermittent radiculitis (12/2014 his neurologist referred him to pain mgmt for epidural steroid injection)  . Erectile dysfunction 2019   due to zoloft--urol rx'd viagra  . GERD (gastroesophageal reflux disease)   . H/O tilt table evaluation 11/02/05   negative  . Helicobacter pylori gastritis 01/2016  . History of adenomatous polyp of colon 10/12/11   Dr. [name] (3 right side of colon- tubular adenomas removed)  . History of cardiovascular stress test 05/28/12   no ischemia, EF 37%, imaging results are unchanged and within normal variance  . History of chronic prostatitis   . History of kidney stones   . History of vertigo    + Hx of posterior HA's.  Neuro (Dr. Erling Cruz) eval 2011.  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  . Hyperlipidemia   . Hypertension   . Lumbar spondylosis    lumbosacral radiculopathy at L4 by EMG testing, right foot drop (neurologist is Dr. Linus Salmons with [facility] in [place])--neurologist referred him to neurosurgery  . Migraine    "used to have them all the time; none for years" (01/04/2017)  . Myocardial infarction [facility]) ?1970s   not entirely certain of this  . Nephrolithiasis 07/2012   Left UVJ 2 mm stone with dilation of renal collecting system and slight hydroureter on right  .  Neuropathy   . NICM (nonischemic cardiomyopathy) ([place])    a. 02/2018 Cath: LM nl, LAD min irregs, LCX no, RCA 20d. [facility]:[id number]. Fick CO/CI 4.4/2.0.  . 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  Since no CAD on cath 05/2016, cards recommends upgrade to CRT-D.  [name] Permanent atrial fibrillation ([place])    DCCV 07/09/13-converted, lasted two days, then back into afib--needs lifetime anticoagulation (Xarelto as of 09/2014)  . Prostate cancer screening 09/2017   done by urol annually (normal prostate exam documented + PSA 0.84 as of 10/01/17 urol f/u.  10/2018 urol f/u PSA 0.6, no prostate nodule.  . Rectus diastasis   . Right ankle sprain 08/2017   w/distal fibula avulsion fx noted on u/s but not plain film-(Dr. Hudnall).  . Skin cancer of arm, left    "burned it off" (01/04/2017)  . TIA (transient ischemic attack)    L face and L arm weakness. Peri procedural->a. 03/22/2018 following cath. CT head neg. No MRI b/c has pacer. Likely due to embolus to distal branch of RMCA  . Type II diabetes mellitus [facility])     Past Surgical  History:  Procedure Laterality Date  . ABI's Bilateral 05/21/2018   normal  . BACK SURGERY    . BIV ICD INSERTION CRT-D N/A 01/04/2017   Procedure: BiV ICD ;  Surgeon: Constance Haw, MD;  Location: [place] CV LAB;  Service: Cardiovascular;  Laterality: N/A;  . CARDIAC CATHETERIZATION N/A 06/14/2016   Minimal nonobstructive dz, EF 25-35%.  Procedure: Left Heart Cath and Coronary Angiography;  Surgeon: Peter M Martinique, MD;  Location: [place] CV LAB;  Service: Cardiovascular;  Laterality: N/A;  . CARDIOVASCULAR STRESS TEST  2012   2012 nuclear perfusion study: low risk scan; 04/2016 normal myocardial perfusion imaging, EF 32%.  [name] CARDIOVERSION  07/09/2012   Procedure: CARDIOVERSION;  Surgeon: [name], MD;  Location: [place];  Service: Cardiovascular;  Laterality: N/A;  . CATARACT EXTRACTION W/ INTRAOCULAR  LENS IMPLANT & ANTERIOR VITRECTOMY, BILATERAL Bilateral   . COLONOSCOPY W/ POLYPECTOMY  approx 2006; repeated 09/2011   Polyps on 2013 EGD as well, repeat 12/2014  . ESOPHAGOGASTRODUODENOSCOPY  10/18/06   Done due to chronic GERD: Normal, bx showed no barrett's esophagus (Dr. [name])  . FLEXOR TENDON REPAIR Left 10/02/2016   Procedure: LEFT RING FINGER WOUND EXPORATION AND FLEXOR TENDON REPAIR AND NERVE REPAIR;  Surgeon: Milly Jakob, MD;  Location: [place];  Service: Orthopedics;  Laterality: Left;  . INSERT / REPLACE / REMOVE PACEMAKER  02/05/2012   dual chamber, sinus node dysfunction, sinus arrest, PAF, Medtronic Revo serial#-[id number] H: last checked 05/2015  . LUMBAR LAMINECTOMY Left 1976   L4-5  . PACEMAKER REMOVAL  01/04/2017  . PERMANENT PACEMAKER INSERTION N/A 02/05/2012   Procedure: PERMANENT PACEMAKER INSERTION;  Surgeon: [name], MD; Generator Medtronic Revo model [place] serial number [id number] H Laterality: N/A;  . RETINAL DETACHMENT SURGERY Left ~ 1999  . REVERSE SHOULDER ARTHROPLASTY Left 2018   Left shoulder reverse [name] in [place]).  [name] RIGHT/LEFT HEART CATH AND CORONARY ANGIOGRAPHY N/A 03/22/2018   EF 30-35%, no CAD.  Procedure: RIGHT/LEFT HEART CATH AND CORONARY ANGIOGRAPHY;  Surgeon: Jolaine Artist, MD;  Location: [place] CV LAB;  Service: Cardiovascular;  Laterality: N/A;  . TRANSTHORACIC ECHOCARDIOGRAM  08/25/10; 05/2012; 03/23/16;12/2017   mild asymmetric LVH, normal systolic function, normal diastolic fxn, mild-to-mod mitral regurg, mild aortic valve sclerosis and trace AI, mild aortic root dilatation. 2014 f/u showed EF 40-45%, mod LAE, A FIB.  02/2016 EF 40%, diffuse hypokinesis, grade 2 DD. 12/2017 EF 35-40%,diffuse hypokin,grd III DD, mild MR    Current Medications: Outpatient Medications Prior to Visit  Medication Sig Dispense Refill  . colchicine 0.6 MG tablet Take 0.6 mg by mouth daily as needed (for gout).     . COMBIGAN 0.2-0.5 % ophthalmic  solution Place 1 drop into both eyes at bedtime.     . empagliflozin (JARDIANCE) 10 MG TABS tablet Take 10 mg by mouth daily. 90 tablet 1  . ENTRESTO 97-103 MG TAKE 1 TABLET TWICE A DAY 180 tablet 3  . eplerenone (INSPRA) 25 MG tablet Take 1 tablet (25 mg total) by mouth daily. 90 tablet 3  . gabapentin (NEURONTIN) 300 MG capsule 2 caps po bid 360 capsule 3  . glucose blood test strip Use to check blood sugars 1-2 times daily 100 each 3  . insulin aspart (NOVOLOG FLEXPEN) 100 UNIT/ML FlexPen 6 U SQ at the time of each meal 18 mL 6  . insulin glargine (LANTUS) 100 UNIT/ML injection 40 Units SQ qd.  Pt will be titrating his dose periodically.  30 mL 6  . Insulin Pen Needle (PEN NEEDLES) 31G X 5 MM MISC 1 each by Does not apply route daily. 100 each 0  . Insulin Syringe-Needle U-100 (EASY COMFORT INSULIN SYRINGE) 32G X 5/16" 1 ML MISC 1 each by Does not apply route daily. 100 each 2  . Lancets (FREESTYLE) lancets USE AS INSTRUCTED 100 each 11  . latanoprost (XALATAN) 0.005 % ophthalmic solution Place 1 drop into both eyes at bedtime.   12  . meclizine (ANTIVERT) 25 MG tablet TAKE 1 TABLET THREE TIMES A DAY AS NEEDED FOR DIZZINESS 270 tablet 3  . omeprazole (PRILOSEC) 40 MG capsule TAKE 1 CAPSULE DAILY 90 capsule 4  . oxybutynin (DITROPAN-XL) 5 MG 24 hr tablet TAKE 1 TABLET AT BEDTIME 90 tablet 1  . rOPINIRole (REQUIP) 1 MG tablet TAKE 1 TABLET DAILY 90 tablet 3  . sertraline (ZOLOFT) 100 MG tablet TAKE 1 TABLET DAILY 90 tablet 4  . simvastatin (ZOCOR) 20 MG tablet TAKE 1 TABLET EVERY EVENING 90 tablet 1  . TOPROL XL 50 MG 24 hr tablet TAKE 1 TABLET DAILY. TAKE WITH OR IMMEDIATELY FOLLOWING A MEAL 90 tablet 1  . XARELTO 20 MG TABS tablet TAKE 1 TABLET DAILY WITH SUPPER 90 tablet 3   No facility-administered medications prior to visit.      Allergies:   Patient has no known allergies.   Social History   Socioeconomic History  . Marital status: Married    Spouse name: Not on file  . Number of  children: Not on file  . Years of education: Not on file  . Highest education level: Not on file  Occupational History  . Not on file  Social Needs  . Financial resource strain: Not on file  . Food insecurity    Worry: Not on file    Inability: Not on file  . Transportation needs    Medical: Not on file    Non-medical: Not on file  Tobacco Use  . Smoking status: Never Smoker  . Smokeless tobacco: Never Used  Substance and Sexual Activity  . Alcohol use: Yes    Comment: occ  . Drug use: No  . Sexual activity: Not on file  Lifestyle  . Physical activity    Days per week: Not on file    Minutes per session: Not on file  . Stress: Not on file  Relationships  . Social Herbalist on phone: Not on file    Gets together: Not on file    Attends religious service: Not on file    Active member of club or organization: Not on file    Attends meetings of clubs or organizations: Not on file    Relationship status: Not on file  Other Topics Concern  . Not on file  Social History Narrative  . Not on file     Family History:  The patient's family history includes Cancer in his brother, brother, and sister; Heart disease in his brother, sister, and sister; Heart failure in his mother; Stroke in his father and mother.   ROS:   Please see the history of present illness.    ROS all other systems are reviewed and are negative   PHYSICAL EXAM:   VS:  There were no vitals taken for this visit.     General: Alert, oriented x3, no distress, obese, healthy left subclavian pacemaker site Head: no evidence of trauma, PERRL, EOMI, no exophtalmos or lid lag, no myxedema,  no xanthelasma; normal ears, nose and oropharynx Neck: normal jugular venous pulsations and no hepatojugular reflux; brisk carotid pulses without delay and no carotid bruits Chest: clear to auscultation, no signs of consolidation by percussion or palpation, normal fremitus, symmetrical and full respiratory excursions  Cardiovascular: normal position and quality of the apical impulse, regular rhythm, normal first and second heart sounds, no murmurs, rubs or gallops Abdomen: no tenderness or distention, no masses by palpation, no abnormal pulsatility or arterial bruits, normal bowel sounds, no hepatosplenomegaly Extremities: no clubbing, cyanosis or edema; 2+ radial, ulnar and brachial pulses bilaterally; 2+ right femoral, posterior tibial and dorsalis pedis pulses; 2+ left femoral, posterior tibial and dorsalis pedis pulses; no subclavian or femoral bruits Neurological: grossly nonfocal Psych: Normal mood and affect   Wt Readings from Last 3 Encounters:  05/19/19 238 lb 9.6 oz (108.2 kg)  04/03/19 241 lb 8 oz (109.5 kg)  03/18/19 237 lb 2 oz (107.6 kg)     Studies/Labs Reviewed:   EKG:  EKG is ordered today.   It shows A. Fib, 100% ventricular paced rhythm, QTC 517 ms  Recent Labs: 08/26/2018: B Natriuretic Peptide 176.7 11/29/2018: ALT 12; Hemoglobin 16.4; Platelets 140.0 05/27/2019: BUN 25; Creatinine, Ser 1.57; Potassium 4.1; Sodium 139   Lipid Panel    Component Value Date/Time   CHOL 162 11/29/2018 0957   TRIG 267.0 (H) 11/29/2018 0957   HDL 32.70 (L) 11/29/2018 0957   CHOLHDL 5 11/29/2018 0957   VLDL 53.4 (H) 11/29/2018 0957   LDLCALC 73 03/23/2018 0500   LDLDIRECT 95.0 11/29/2018 0957     ASSESSMENT:    1. Chronic systolic (congestive) heart failure ([place])   2. CHB (complete heart block) (HCC)   3. Permanent atrial fibrillation ([place])   4. Long term (current) use of anticoagulants   5. Biventricular automatic implantable cardioverter defibrillator in situ   6. Essential hypertension   7. PAD (peripheral artery disease) ([place])   8. Diabetes mellitus type 2 in obese ([place])   9. Dyslipidemia (high LDL; low HDL)      PLAN:  In order of problems listed above:  1. CHF: On max dose Entresto, maximum tolerated dose of beta-blocker, eplerenone (switched from spironolactone due to  gynecomastia).  He has NYHA functional class I-II and appears euvolemic both clinically and by OptiVol. Encouraged adequate water intake (on Jardiance). 2. CHB: Pacemaker dependent, no underlying escape rhythm. 3. AFib: Permanent.  Appropriately anticoagulated.  CHADSVasc 5 (age 74, diabetes, heart failure, hypertension). 4. Xarelto: No bleeding complications. 5. CRT-D: Normal device function.   I increased his exertion rate response slope from 4-5 because of his complaints of exertional dyspnea/fatigue. 6. HTN: Excellent control. 7. PAD: Very mild abnormality in the left lower extremity ABI.  Does not have claudication. 8. DM/obesity/dyslipidemia: :LDL acceptable (no documented arterial obstruction) HDL is low. Encouraged weight loss.    Medication Adjustments/Labs and Tests Ordered: Current medicines are reviewed at length with the patient today.  Concerns regarding medicines are outlined above.  Medication changes, Labs and Tests ordered today are listed in the Patient Instructions below. There are no Patient Instructions on file for this visit.   Signed, [name], MD  06/01/2019 6:32 PM    [facility] [place], [place], [place]   [address] Phone: [phone]; Fax: [phone]

## 2019-06-02 ENCOUNTER — Ambulatory Visit (INDEPENDENT_AMBULATORY_CARE_PROVIDER_SITE_OTHER): Payer: Medicare Other | Admitting: Cardiovascular Disease

## 2019-06-02 ENCOUNTER — Encounter: Payer: Self-pay | Admitting: Cardiovascular Disease

## 2019-06-02 ENCOUNTER — Other Ambulatory Visit: Payer: Self-pay

## 2019-06-02 VITALS — BP 121/84 | HR 74 | Ht 71.0 in | Wt 242.0 lb

## 2019-06-02 DIAGNOSIS — Z95810 Presence of automatic (implantable) cardiac defibrillator: Secondary | ICD-10-CM | POA: Diagnosis not present

## 2019-06-02 DIAGNOSIS — Z7901 Long term (current) use of anticoagulants: Secondary | ICD-10-CM

## 2019-06-02 DIAGNOSIS — I739 Peripheral vascular disease, unspecified: Secondary | ICD-10-CM | POA: Diagnosis not present

## 2019-06-02 DIAGNOSIS — I5022 Chronic systolic (congestive) heart failure: Secondary | ICD-10-CM

## 2019-06-02 DIAGNOSIS — I442 Atrioventricular block, complete: Secondary | ICD-10-CM | POA: Diagnosis not present

## 2019-06-02 DIAGNOSIS — E669 Obesity, unspecified: Secondary | ICD-10-CM

## 2019-06-02 DIAGNOSIS — E1169 Type 2 diabetes mellitus with other specified complication: Secondary | ICD-10-CM | POA: Diagnosis not present

## 2019-06-02 DIAGNOSIS — I4821 Permanent atrial fibrillation: Secondary | ICD-10-CM

## 2019-06-02 DIAGNOSIS — E785 Hyperlipidemia, unspecified: Secondary | ICD-10-CM

## 2019-06-02 DIAGNOSIS — I10 Essential (primary) hypertension: Secondary | ICD-10-CM | POA: Diagnosis not present

## 2019-06-02 NOTE — Patient Instructions (Signed)
Medication Instructions:  No changes *If you need a refill on your cardiac medications before your next appointment, please call your pharmacy*  Lab Work: None ordered If you have labs (blood work) drawn today and your tests are completely normal, you will receive your results only by: . MyChart Message (if you have MyChart) OR . A paper copy in the mail If you have any lab test that is abnormal or we need to change your treatment, we will call you to review the results.  Testing/Procedures: None ordered  Follow-Up: At [facility], you and your health needs are our priority.  As part of our continuing mission to provide you with exceptional heart care, we have created designated Provider Care Teams.  These Care Teams include your primary Cardiologist (physician) and Advanced Practice Providers (APPs -  Physician Assistants and Nurse Practitioners) who all work together to provide you with the care you need, when you need it.  Your next appointment:   6 month(s)  The format for your next appointment:   In Person  Provider:   Mihai Croitoru, MD    

## 2019-06-03 ENCOUNTER — Other Ambulatory Visit: Payer: Self-pay

## 2019-06-03 MED ORDER — NOVOLOG FLEXPEN 100 UNIT/ML ~~LOC~~ SOPN: PEN_INJECTOR | SUBCUTANEOUS | 6 refills | Status: DC

## 2019-06-03 MED ORDER — INSULIN GLARGINE 100 UNIT/ML ~~LOC~~ SOLN: SUBCUTANEOUS | 6 refills | Status: DC

## 2019-06-27 ENCOUNTER — Other Ambulatory Visit: Payer: Self-pay | Admitting: Family Medicine

## 2019-07-03 ENCOUNTER — Other Ambulatory Visit: Payer: Self-pay

## 2019-07-03 ENCOUNTER — Telehealth: Payer: Self-pay

## 2019-07-03 MED ORDER — ROPINIROLE HCL 1 MG PO TABS: 1.0000 mg | ORAL_TABLET | Freq: Every day | ORAL | 1 refills | Status: DC

## 2019-07-03 NOTE — Telephone Encounter (Signed)
Patient left a VM requesting med refills for Requip and Entresto. Tried to call pt regarding this, not available. Okay per DPR to discuss with his wife, Tessie Fass. She was advised pt's cardiologist would need to fill entresto. Refill sent for requip.

## 2019-07-10 ENCOUNTER — Other Ambulatory Visit: Payer: Self-pay

## 2019-07-10 MED ORDER — ENTRESTO 97-103 MG PO TABS: 1.0000 | ORAL_TABLET | Freq: Two times a day (BID) | ORAL | 3 refills | Status: DC

## 2019-07-25 DIAGNOSIS — R0600 Dyspnea, unspecified: Secondary | ICD-10-CM

## 2019-07-25 HISTORY — DX: Dyspnea, unspecified: R06.00

## 2019-07-29 ENCOUNTER — Ambulatory Visit (INDEPENDENT_AMBULATORY_CARE_PROVIDER_SITE_OTHER): Payer: Medicare Other | Admitting: *Deleted

## 2019-07-29 DIAGNOSIS — I428 Other cardiomyopathies: Secondary | ICD-10-CM

## 2019-07-29 LAB — CUP PACEART REMOTE DEVICE CHECK
Battery Remaining Longevity: 54 mo
Battery Voltage: 2.97 V
Brady Statistic AP VP Percent: 0 %
Brady Statistic AP VS Percent: 0 %
Brady Statistic AS VP Percent: 0 %
Brady Statistic AS VS Percent: 0 %
Brady Statistic RA Percent Paced: 0 %
Brady Statistic RV Percent Paced: 99.96 %
Date Time Interrogation Session: 20210105012504
HighPow Impedance: 80 Ohm
Implantable Lead Implant Date: 20130715
Implantable Lead Implant Date: 20180614
Implantable Lead Implant Date: 20180614
Implantable Lead Location: 753858
Implantable Lead Location: 753859
Implantable Lead Location: 753860
Implantable Lead Model: 4598
Implantable Pulse Generator Implant Date: 20180614
Lead Channel Impedance Value: 193.707
Lead Channel Impedance Value: 193.707
Lead Channel Impedance Value: 205.114
Lead Channel Impedance Value: 222.34 Ohm
Lead Channel Impedance Value: 222.34 Ohm
Lead Channel Impedance Value: 342 Ohm
Lead Channel Impedance Value: 361 Ohm
Lead Channel Impedance Value: 418 Ohm
Lead Channel Impedance Value: 418 Ohm
Lead Channel Impedance Value: 475 Ohm
Lead Channel Impedance Value: 475 Ohm
Lead Channel Impedance Value: 532 Ohm
Lead Channel Impedance Value: 532 Ohm
Lead Channel Impedance Value: 665 Ohm
Lead Channel Impedance Value: 703 Ohm
Lead Channel Impedance Value: 760 Ohm
Lead Channel Impedance Value: 760 Ohm
Lead Channel Impedance Value: 760 Ohm
Lead Channel Pacing Threshold Amplitude: 0.5 V
Lead Channel Pacing Threshold Amplitude: 0.625 V
Lead Channel Pacing Threshold Pulse Width: 0.4 ms
Lead Channel Pacing Threshold Pulse Width: 1 ms
Lead Channel Sensing Intrinsic Amplitude: 0.625 mV
Lead Channel Sensing Intrinsic Amplitude: 20.5 mV
Lead Channel Sensing Intrinsic Amplitude: 20.5 mV
Lead Channel Setting Pacing Amplitude: 1 V
Lead Channel Setting Pacing Amplitude: 2 V
Lead Channel Setting Pacing Pulse Width: 0.4 ms
Lead Channel Setting Pacing Pulse Width: 1 ms
Lead Channel Setting Sensing Sensitivity: 0.3 mV

## 2019-08-01 ENCOUNTER — Telehealth: Payer: Self-pay

## 2019-08-01 ENCOUNTER — Emergency Department (INDEPENDENT_AMBULATORY_CARE_PROVIDER_SITE_OTHER)
Admission: EM | Admit: 2019-08-01 | Discharge: 2019-08-01 | Disposition: A | Discharge status: Home / Self Care | Payer: Medicare Other | Source: Home / Self Care

## 2019-08-01 ENCOUNTER — Other Ambulatory Visit: Payer: Self-pay

## 2019-08-01 ENCOUNTER — Ambulatory Visit: Payer: Medicare Other

## 2019-08-01 DIAGNOSIS — M109 Gout, unspecified: Secondary | ICD-10-CM

## 2019-08-01 MED ORDER — INDOMETHACIN 50 MG PO CAPS: 50.0000 mg | ORAL_CAPSULE | Freq: Two times a day (BID) | ORAL | 0 refills | Status: DC

## 2019-08-01 NOTE — ED Provider Notes (Signed)
[name]    CSN: [id number] Arrival date & time: 08/01/19  1120      History   Chief Complaint Chief Complaint  Patient presents with  . Knee Pain    HPI Mason [name] is a 79 y.o. male.   The history is provided by the patient. No language interpreter was used.  Knee Pain Location:  Knee Injury: no   Pain details:    Quality:  Aching   Radiates to:  Does not radiate   Severity:  Moderate   Onset quality:  Gradual   Timing:  Constant   Progression:  Worsening Chronicity:  New Relieved by:  Nothing Worsened by:  Nothing Pt complains of gout to his right knee.  Pt request a prescription for indocin.    Past Medical History:  Diagnosis Date  . AICD (automatic cardioverter/defibrillator) present 2018   MDT CRT-D.  Fatigue-->completely pacer dependent.  Pacer settings adjusted 12/2017 to allow more chronotrophc variance with ADL's//exertion.  . Arthritis    Pt is s/p eft reverse total shoulder arthroplasty.  . Balanitis    chronic fungal  . BPH (benign prostatic hypertrophy) 06/2011   Irritative sx's; pt declined trial of anticholinergic per Urology records  . Chronic combined systolic and diastolic heart failure ([address]) 05/31/2012   Nonischemic:  EF 40-45%, LA mod-severe dilated, AFIB.   02/2016 EF 40%, diffuse hypokinesis, grade 2 DD.  Myoc perf imaging showed EF 32% 04/2016.  Pt upgraded to CRT-D 01/04/17.  [name] Chronic renal insufficiency, stage III (moderate) 2015   CrCl about 60 ml/min  . Complete heart block (HCC)    Has dual chamber pacer.  . Depression   . DOE (dyspnea on exertion)    NYHA class II/III CHF  . Episodic low back pain 01/22/2013   w/intermittent radiculitis (12/2014 his neurologist referred him to pain mgmt for epidural steroid injection)  . Erectile dysfunction 2019   due to zoloft--urol rx'd viagra  . GERD (gastroesophageal reflux disease)   . H/O tilt table evaluation 11/02/05   negative  . Helicobacter pylori gastritis 01/2016  .  History of adenomatous polyp of colon 10/12/11   Dr. [name] (3 right side of colon- tubular adenomas removed)  . History of cardiovascular stress test 05/28/12   no ischemia, EF 37%, imaging results are unchanged and within normal variance  . History of chronic prostatitis   . History of kidney stones   . History of vertigo    + Hx of posterior HA's.  Neuro (Dr. Erling Cruz) eval 2011.  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  . Hyperlipidemia   . Hypertension   . Lumbar spondylosis    lumbosacral radiculopathy at L4 by EMG testing, right foot drop (neurologist is Dr. Linus Salmons with [facility] in [place])--neurologist referred him to neurosurgery  . Migraine    "used to have them all the time; none for years" (01/04/2017)  . Myocardial infarction [facility]) ?1970s   not entirely certain of this  . Nephrolithiasis 07/2012   Left UVJ 2 mm stone with dilation of renal collecting system and slight hydroureter on right  . Neuropathy   . NICM (nonischemic cardiomyopathy) ([place])    a. 02/2018 Cath: LM nl, LAD min irregs, LCX no, RCA 20d. [id number]. Fick CO/CI 4.4/2.0.  . 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  Since no CAD on cath 05/2016, cards recommends upgrade to CRT-D.  [name]  Permanent atrial fibrillation ([place])    DCCV 07/09/13-converted, lasted two days, then back into afib--needs lifetime anticoagulation (Xarelto as of 09/2014)  . Prostate cancer screening 09/2017   done by urol annually (normal prostate exam documented + PSA 0.84 as of 10/01/17 urol f/u.  10/2018 urol f/u PSA 0.6, no prostate nodule.  . Rectus diastasis   . Right ankle sprain 08/2017   w/distal fibula avulsion fx noted on u/s but not plain film-(Dr. Hudnall).  . Skin cancer of arm, left    "burned it off" (01/04/2017)  . TIA (transient ischemic attack)    L face and L arm weakness. Peri procedural->a.  03/22/2018 following cath. CT head neg. No MRI b/c has pacer. Likely due to embolus to distal branch of RMCA  . Type II diabetes mellitus [facility])     Patient Active Problem List   Diagnosis Date Noted  . Long term (current) use of anticoagulants 09/03/2018  . 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 2018 07/24/2018  . NICM (nonischemic cardiomyopathy) ([place]) 03/23/2018  . CKD (chronic kidney disease), stage III 03/23/2018  . TIA (transient ischemic attack) 03/22/2018  . TIA resulting from procedure 03/22/2018  . Essential hypertension 01/18/2018  . CHF (congestive heart failure) ([place]) 01/04/2017  . Chronic systolic heart failure ([place]) 08/04/2016  . Chronic renal insufficiency, stage II (mild) 12/09/2015  . Permanent atrial fibrillation ([place]) 06/22/2015  . Diabetes mellitus with complication ([place]) 0000000  . Leg fatigue 02/15/2014  . Lumbar back pain with radiculopathy affecting left lower extremity 02/15/2014  . Other malaise and fatigue 02/05/2014  . Cervical muscle strain 11/27/2013  . Osteoarthritis of right knee 10/14/2013  . HTN (hypertension), benign 05/26/2013  . Hyperlipidemia 04/21/2013  . Diabetes mellitus type 2 in obese ([place]) 04/21/2013  . Atrial fibrillation, permanent 03/08/2013  . Left ventricular dysfunction 05/31/2012  . Complete heart block ([place]) 02/06/2012  . Dyslipidemia (high LDL; low HDL) 02/06/2012    Past Surgical History:  Procedure Laterality Date  . ABI's Bilateral 05/21/2018   normal  . BACK SURGERY    . BIV ICD INSERTION CRT-D N/A 01/04/2017   Procedure: BiV ICD ;  Surgeon: Constance Haw, MD;  Location: [place] [facility];  Service: Cardiovascular;  Laterality: N/A;  . CARDIAC CATHETERIZATION N/A 06/14/2016   Minimal nonobstructive dz, EF 25-35%.  Procedure: Left Heart Cath and Coronary Angiography;  Surgeon: Peter M Martinique, MD;  Location: [place] [facility];  Service: Cardiovascular;  Laterality: N/A;  .  CARDIOVASCULAR STRESS TEST  2012   2012 nuclear perfusion study: low risk scan; 04/2016 normal myocardial perfusion imaging, EF 32%.  [name] CARDIOVERSION  07/09/2012   Procedure: CARDIOVERSION;  Surgeon: [name], MD;  Location: [place];  Service: Cardiovascular;  Laterality: N/A;  . CATARACT EXTRACTION W/ INTRAOCULAR LENS IMPLANT & ANTERIOR VITRECTOMY, BILATERAL Bilateral   . COLONOSCOPY W/ POLYPECTOMY  approx 2006; repeated 09/2011   Polyps on 2013 EGD as well, repeat 12/2014  . ESOPHAGOGASTRODUODENOSCOPY  10/18/06   Done due to chronic GERD: Normal, bx showed no barrett's esophagus (Dr. [name])  . FLEXOR TENDON REPAIR Left 10/02/2016   Procedure: LEFT RING FINGER WOUND EXPORATION AND FLEXOR TENDON REPAIR AND NERVE REPAIR;  Surgeon: Milly Jakob, MD;  Location: [place];  Service: Orthopedics;  Laterality: Left;  . INSERT / REPLACE / REMOVE PACEMAKER  02/05/2012   dual chamber, sinus node dysfunction, sinus arrest, PAF, Medtronic Revo serial#[id number] H: last checked 05/2015  . LUMBAR LAMINECTOMY Left 1976   L4-5  .  PACEMAKER REMOVAL  01/04/2017  . PERMANENT PACEMAKER INSERTION N/A 02/05/2012   Procedure: PERMANENT PACEMAKER INSERTION;  Surgeon: [name], MD; Generator Medtronic Revo model [place] serial number [id number] H Laterality: N/A;  . RETINAL DETACHMENT SURGERY Left ~ 1999  . REVERSE SHOULDER ARTHROPLASTY Left 2018   Left shoulder reverse [name] in [place]).  [name] RIGHT/LEFT HEART CATH AND CORONARY ANGIOGRAPHY N/A 03/22/2018   EF 30-35%, no CAD.  Procedure: RIGHT/LEFT HEART CATH AND CORONARY ANGIOGRAPHY;  Surgeon: Jolaine Artist, MD;  Location: [place] [facility];  Service: Cardiovascular;  Laterality: N/A;  . TRANSTHORACIC ECHOCARDIOGRAM  08/25/10; 05/2012; 03/23/16;12/2017   mild asymmetric LVH, normal systolic function, normal diastolic fxn, mild-to-mod mitral regurg, mild aortic valve sclerosis and trace AI, mild aortic root dilatation. 2014 f/u showed EF 40-45%,  mod LAE, A FIB.  02/2016 EF 40%, diffuse hypokinesis, grade 2 DD. 12/2017 EF 35-40%,diffuse hypokin,grd III DD, mild MR       Home Medications    Prior to Admission medications   Medication Sig Start Date End Date Taking? Authorizing Provider  colchicine 0.6 MG tablet Take 0.6 mg by mouth daily as needed (for gout).  08/20/15   [provider]  COMBIGAN 0.2-0.5 % ophthalmic solution Place 1 drop into both eyes at bedtime.  04/19/16   [provider]  empagliflozin (JARDIANCE) 10 MG TABS tablet Take 10 mg by mouth daily. 03/25/19   McGowen, Adrian Blackwater, MD  ENTRESTO 97-103 MG Take 1 tablet by mouth 2 (two) times daily. 07/10/19   Croitoru, Mihai, MD  eplerenone (INSPRA) 25 MG tablet Take 1 tablet (25 mg total) by mouth daily. 07/23/18   Croitoru, Mihai, MD  gabapentin (NEURONTIN) 300 MG capsule 2 caps po bid 08/29/18   McGowen, Adrian Blackwater, MD  glucose blood test strip Use to check blood sugars 1-2 times daily 03/03/19   McGowen, Adrian Blackwater, MD  indomethacin (INDOCIN) 50 MG capsule Take 1 capsule (50 mg total) by mouth 2 (two) times daily with a meal. 08/01/19   Fransico Meadow, PA-C  insulin aspart (NOVOLOG FLEXPEN) 100 UNIT/ML FlexPen 8 U SQ at the time of each meal 06/03/19   McGowen, Adrian Blackwater, MD  insulin glargine (LANTUS) 100 UNIT/ML injection 43 Units SQ qd.  Pt will be titrating his dose periodically. 06/03/19   McGowen, Adrian Blackwater, MD  Insulin Pen Needle (PEN NEEDLES) 31G X 5 MM MISC 1 each by Does not apply route daily. 02/12/19   McGowen, Adrian Blackwater, MD  Insulin Syringe-Needle U-100 (EASY COMFORT INSULIN SYRINGE) 32G X 5/16" 1 ML MISC 1 each by Does not apply route daily. 03/04/19   McGowen, Adrian Blackwater, MD  Lancets (FREESTYLE) lancets USE AS INSTRUCTED 05/08/19   McGowen, Adrian Blackwater, MD  latanoprost (XALATAN) 0.005 % ophthalmic solution Place 1 drop into both eyes at bedtime.  04/24/15   [provider]  meclizine (ANTIVERT) 25 MG tablet TAKE 1 TABLET THREE TIMES A DAY AS NEEDED FOR  DIZZINESS 10/02/18   McGowen, Adrian Blackwater, MD  omeprazole (PRILOSEC) 40 MG capsule TAKE 1 CAPSULE DAILY 07/26/18   McGowen, Adrian Blackwater, MD  oxybutynin (DITROPAN-XL) 5 MG 24 hr tablet TAKE 1 TABLET AT BEDTIME 03/18/19   McGowen, Adrian Blackwater, MD  rOPINIRole (REQUIP) 1 MG tablet Take 1 tablet (1 mg total) by mouth daily. 07/03/19   Tammi Sou, MD  sertraline (ZOLOFT) 100 MG tablet TAKE 1 TABLET DAILY 04/29/18   McGowen, Adrian Blackwater, MD  simvastatin (ZOCOR) 20  MG tablet TAKE 1 TABLET EVERY EVENING 06/27/19   McGowen, Adrian Blackwater, MD  TOPROL XL 50 MG 24 hr tablet TAKE 1 TABLET DAILY. TAKE WITH OR IMMEDIATELY FOLLOWING A MEAL 03/18/19   McGowen, Adrian Blackwater, MD  XARELTO 20 MG TABS tablet TAKE 1 TABLET DAILY WITH SUPPER 04/15/19   Croitoru, Dani Gobble, MD    Family History Family History  Problem Relation Age of Onset  . Heart failure Mother   . Stroke Mother   . Stroke Father   . Heart disease Sister   . Heart disease Sister   . Cancer Sister        liver  . Cancer Brother        lung  . Cancer Brother        lung  . Heart disease Brother     Social History Social History   Tobacco Use  . Smoking status: Never Smoker  . Smokeless tobacco: Never Used  Substance Use Topics  . Alcohol use: Yes    Comment: occ  . Drug use: No     Allergies   Patient has no known allergies.   Review of Systems Review of Systems  All other systems reviewed and are negative.    Physical Exam Triage Vital Signs ED Triage Vitals  Enc Vitals Group     BP 08/01/19 1150 (!) 131/91     Pulse Rate 08/01/19 1150 80     Resp 08/01/19 1150 20     Temp 08/01/19 1150 98.4 F (36.9 C)     Temp Source 08/01/19 1150 Oral     SpO2 08/01/19 1150 96 %     Weight 08/01/19 1151 240 lb (108.9 kg)     Height 08/01/19 1151 5\' 11"  (1.803 m)     Head Circumference --      Peak Flow --      Pain Score 08/01/19 1151 10     Pain Loc --      Pain Edu? --      Excl. in [place]? --    No data found.  Updated Vital Signs BP (!)  131/91 (BP Location: Right Arm)   Pulse 80   Temp 98.4 F (36.9 C) (Oral)   Resp 20   Ht 5\' 11"  (1.803 m)   Wt 108.9 kg   SpO2 96%   BMI 33.47 kg/m   Visual Acuity Right Eye Distance:   Left Eye Distance:   Bilateral Distance:    Right Eye Near:   Left Eye Near:    Bilateral Near:     Physical Exam Vitals and nursing note reviewed.  Constitutional:      Appearance: He is well-developed.  HENT:     Head: Normocephalic and atraumatic.  Eyes:     Conjunctiva/sclera: Conjunctivae normal.  Cardiovascular:     Rate and Rhythm: Normal rate.     Heart sounds: No murmur.  Pulmonary:     Effort: No respiratory distress.  Abdominal:     Tenderness: There is no abdominal tenderness.  Musculoskeletal:        General: Swelling and tenderness present.     Comments: Tender right knee, pain with movement, nv and ns intact   Skin:    General: Skin is warm and dry.  Neurological:     Mental Status: He is alert.      UC Treatments / Results  Labs (all labs ordered are listed, but only abnormal results are displayed) Labs Reviewed - No data  to display  EKG   Radiology No results found.  Procedures Procedures (including critical care time)  Medications Ordered in UC Medications - No data to display  Initial Impression / Assessment and Plan / UC Course  I have reviewed the triage vital signs and the nursing notes.  Pertinent labs & imaging results that were available during my care of the patient were reviewed by me and considered in my medical decision making (see chart for details).     MDM  Pt given rx for indocin.  On review of chart pt is on xarelto.  Pt called and advised to not take incocin while on xarelto.  Final Clinical Impressions(s) / UC Diagnoses   Final diagnoses:  Acute gout of right knee, unspecified cause   Discharge Instructions   None    ED Prescriptions    Medication Sig Dispense Auth. Provider   indomethacin (INDOCIN) 50 MG capsule  Take 1 capsule (50 mg total) by mouth 2 (two) times daily with a meal. 30 capsule Fransico Meadow, [place]    An After Visit Summary was printed and given to the patient.  PDMP not reviewed this encounter.   Fransico Meadow, [place] 08/01/19 1903

## 2019-08-01 NOTE — ED Triage Notes (Signed)
Pt states that he started having right knee pain last night.  Redness noted in knee and has hx of gout

## 2019-08-02 MED ORDER — COLCHICINE 0.6 MG PO TABS: 0.6000 mg | ORAL_TABLET | Freq: Every day | ORAL | 0 refills | Status: DC | PRN

## 2019-08-02 NOTE — Telephone Encounter (Signed)
Patient is requesting a prescription of Col[place] for gout, seen yesterday.  Correct pharmacy on file.  This medication has worked well for him in the past.

## 2019-08-02 NOTE — Telephone Encounter (Signed)
Patient informed and new prescription sent to pharmacy.

## 2019-08-02 NOTE — Telephone Encounter (Signed)
Received a phone call from patient regarding indomethacin prescribed yesterday. Patient is prescribed chronic anticoagulant and is unable take indomethacin. Colchicine tolerated in the past and will resume daily PRN as needed.

## 2019-08-05 ENCOUNTER — Other Ambulatory Visit: Payer: Self-pay

## 2019-08-06 ENCOUNTER — Ambulatory Visit (INDEPENDENT_AMBULATORY_CARE_PROVIDER_SITE_OTHER): Payer: Medicare Other | Admitting: Family Medicine

## 2019-08-06 ENCOUNTER — Encounter: Payer: Self-pay | Admitting: Family Medicine

## 2019-08-06 VITALS — BP 93/68 | HR 77 | Temp 97.6°F | Resp 16 | Ht 71.0 in | Wt 229.8 lb

## 2019-08-06 DIAGNOSIS — M25561 Pain in right knee: Secondary | ICD-10-CM

## 2019-08-06 DIAGNOSIS — M10061 Idiopathic gout, right knee: Secondary | ICD-10-CM

## 2019-08-06 MED ORDER — PREDNISONE 20 MG PO TABS: ORAL_TABLET | ORAL | 0 refills | Status: DC

## 2019-08-06 NOTE — Progress Notes (Signed)
OFFICE VISIT  08/06/2019   CC:  Chief Complaint  Patient presents with  . Knee Pain    right, x6 days   HPI:    Patient is a 79 y.o. Caucasian male who presents for progressive right knee pain recently. He presented to [facility] 08/01/19 for this and was dx'd with acute gouty arthritis and rx'd colchicine. I reviewed this entire record today.  Onset 6 d/a right knee pain in evening, next morning "real bad".  No preceding injury. Was a little swollen, more prominently over knee cap, a little pinkish, felt hot.  Has tried ice and heat. Has been taking colchicine but no significant help.  Pt says he has had gout in the past in knees, no other joints.  Has not had a flare in about 18 mo at least. Says usually indocin helps best but this is contraindicated b/c he is on xarelto.  ROS: no fevers, no nausea/abd pain/malaise, no dizziness, no HAs, no rashes, no melena/hematochezia.  No other joints bothering him at this time.  Past Medical History:  Diagnosis Date  . AICD (automatic cardioverter/defibrillator) present 2018   MDT CRT-D.  Fatigue-->completely pacer dependent.  Pacer settings adjusted 12/2017 to allow more chronotrophc variance with ADL's//exertion.  . Arthritis    Pt is s/p eft reverse total shoulder arthroplasty.  . Balanitis    chronic fungal  . BPH (benign prostatic hypertrophy) 06/2011   Irritative sx's; pt declined trial of anticholinergic per Urology records  . Chronic combined systolic and diastolic heart failure ([place]) 05/31/2012   Nonischemic:  EF 40-45%, LA mod-severe dilated, AFIB.   02/2016 EF 40%, diffuse hypokinesis, grade 2 DD.  Myoc perf imaging showed EF 32% 04/2016.  Pt upgraded to CRT-D 01/04/17.  [name] Chronic renal insufficiency, stage III (moderate) 2015   CrCl about 60 ml/min  . Complete heart block (HCC)    Has dual chamber pacer.  . Depression   . DOE (dyspnea on exertion)    NYHA class II/III CHF  . Episodic low back pain 01/22/2013   w/intermittent radiculitis (12/2014 his neurologist referred him to pain mgmt for epidural steroid injection)  . Erectile dysfunction 2019   due to zoloft--urol rx'd viagra  . GERD (gastroesophageal reflux disease)   . H/O tilt table evaluation 11/02/05   negative  . Helicobacter pylori gastritis 01/2016  . History of adenomatous polyp of colon 10/12/11   Dr. [name] (3 right side of colon- tubular adenomas removed)  . History of cardiovascular stress test 05/28/12   no ischemia, EF 37%, imaging results are unchanged and within normal variance  . History of chronic prostatitis   . History of kidney stones   . History of vertigo    + Hx of posterior HA's.  Neuro (Dr. Erling Cruz) eval 2011.  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  . Hyperlipidemia   . Hypertension   . Lumbar spondylosis    lumbosacral radiculopathy at L4 by EMG testing, right foot drop (neurologist is Dr. Linus Salmons with [facility] in [place])--neurologist referred him to neurosurgery  . Migraine    "used to have them all the time; none for years" (01/04/2017)  . Myocardial infarction [facility]) ?1970s   not entirely certain of this  . Nephrolithiasis 07/2012   Left UVJ 2 mm stone with dilation of renal collecting system and slight hydroureter on right  . Neuropathy   . NICM (nonischemic cardiomyopathy) ([place])    a.  02/2018 Cath: LM nl, LAD min irregs, LCX no, RCA 20d. [place]:[id number]. Fick CO/CI 4.4/2.0.  . 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  Since no CAD on cath 05/2016, cards recommends upgrade to CRT-D.  [name] Permanent atrial fibrillation ([place])    DCCV 07/09/13-converted, lasted two days, then back into afib--needs lifetime anticoagulation (Xarelto as of 09/2014)  . Prostate cancer screening 09/2017   done by urol annually (normal prostate exam documented + PSA 0.84 as of 10/01/17 urol f/u.  10/2018  urol f/u PSA 0.6, no prostate nodule.  . Rectus diastasis   . Right ankle sprain 08/2017   w/distal fibula avulsion fx noted on u/s but not plain film-(Dr. Hudnall).  . Skin cancer of arm, left    "burned it off" (01/04/2017)  . TIA (transient ischemic attack)    L face and L arm weakness. Peri procedural->a. 03/22/2018 following cath. CT head neg. No MRI b/c has pacer. Likely due to embolus to distal branch of RMCA  . Type II diabetes mellitus ([place])     Past Surgical History:  Procedure Laterality Date  . ABI's Bilateral 05/21/2018   normal  . BACK SURGERY    . BIV ICD INSERTION CRT-D N/A 01/04/2017   Procedure: BiV ICD ;  Surgeon: Constance Haw, MD;  Location: [place] [facility];  Service: Cardiovascular;  Laterality: N/A;  . CARDIAC CATHETERIZATION N/A 06/14/2016   Minimal nonobstructive dz, EF 25-35%.  Procedure: Left Heart Cath and Coronary Angiography;  Surgeon: Peter M Martinique, MD;  Location: [place] [facility];  Service: Cardiovascular;  Laterality: N/A;  . CARDIOVASCULAR STRESS TEST  2012   2012 nuclear perfusion study: low risk scan; 04/2016 normal myocardial perfusion imaging, EF 32%.  [name] CARDIOVERSION  07/09/2012   Procedure: CARDIOVERSION;  Surgeon: [name], MD;  Location: [place];  Service: Cardiovascular;  Laterality: N/A;  . CATARACT EXTRACTION W/ INTRAOCULAR LENS IMPLANT & ANTERIOR VITRECTOMY, BILATERAL Bilateral   . COLONOSCOPY W/ POLYPECTOMY  approx 2006; repeated 09/2011   Polyps on 2013 EGD as well, repeat 12/2014  . ESOPHAGOGASTRODUODENOSCOPY  10/18/06   Done due to chronic GERD: Normal, bx showed no barrett's esophagus (Dr. [name])  . FLEXOR TENDON REPAIR Left 10/02/2016   Procedure: LEFT RING FINGER WOUND EXPORATION AND FLEXOR TENDON REPAIR AND NERVE REPAIR;  Surgeon: Milly Jakob, MD;  Location: [place];  Service: Orthopedics;  Laterality: Left;  . INSERT / REPLACE / REMOVE PACEMAKER  02/05/2012   dual chamber, sinus node dysfunction, sinus arrest, PAF,  Medtronic Revo serial#-[id number] H: last checked 05/2015  . LUMBAR LAMINECTOMY Left 1976   L4-5  . PACEMAKER REMOVAL  01/04/2017  . PERMANENT PACEMAKER INSERTION N/A 02/05/2012   Procedure: PERMANENT PACEMAKER INSERTION;  Surgeon: [name], MD; Generator Medtronic Revo model [place] serial number [id number] H Laterality: N/A;  . RETINAL DETACHMENT SURGERY Left ~ 1999  . REVERSE SHOULDER ARTHROPLASTY Left 2018   Left shoulder reverse [name] in [place]).  [name] RIGHT/LEFT HEART CATH AND CORONARY ANGIOGRAPHY N/A 03/22/2018   EF 30-35%, no CAD.  Procedure: RIGHT/LEFT HEART CATH AND CORONARY ANGIOGRAPHY;  Surgeon: Jolaine Artist, MD;  Location: [place] [facility];  Service: Cardiovascular;  Laterality: N/A;  . TRANSTHORACIC ECHOCARDIOGRAM  08/25/10; 05/2012; 03/23/16;12/2017   mild asymmetric LVH, normal systolic function, normal diastolic fxn, mild-to-mod mitral regurg, mild aortic valve sclerosis and trace AI, mild aortic root dilatation. 2014 f/u showed EF 40-45%, mod LAE, A FIB.  02/2016  EF 40%, diffuse hypokinesis, grade 2 DD. 12/2017 EF 35-40%,diffuse hypokin,grd III DD, mild MR    Outpatient Medications Prior to Visit  Medication Sig Dispense Refill  . COMBIGAN 0.2-0.5 % ophthalmic solution Place 1 drop into both eyes at bedtime.     . empagliflozin (JARDIANCE) 10 MG TABS tablet Take 10 mg by mouth daily. 90 tablet 1  . ENTRESTO 97-103 MG Take 1 tablet by mouth 2 (two) times daily. 180 tablet 3  . eplerenone (INSPRA) 25 MG tablet Take 1 tablet (25 mg total) by mouth daily. 90 tablet 3  . gabapentin (NEURONTIN) 300 MG capsule 2 caps po bid 360 capsule 3  . glucose blood test strip Use to check blood sugars 1-2 times daily 100 each 3  . insulin aspart (NOVOLOG FLEXPEN) 100 UNIT/ML FlexPen 8 U SQ at the time of each meal 18 mL 6  . insulin glargine (LANTUS) 100 UNIT/ML injection 43 Units SQ qd.  Pt will be titrating his dose periodically. 30 mL 6  . Insulin Pen Needle (PEN NEEDLES)  31G X 5 MM MISC 1 each by Does not apply route daily. 100 each 0  . Insulin Syringe-Needle U-100 (EASY COMFORT INSULIN SYRINGE) 32G X 5/16" 1 ML MISC 1 each by Does not apply route daily. 100 each 2  . Lancets (FREESTYLE) lancets USE AS INSTRUCTED 100 each 11  . latanoprost (XALATAN) 0.005 % ophthalmic solution Place 1 drop into both eyes at bedtime.   12  . meclizine (ANTIVERT) 25 MG tablet TAKE 1 TABLET THREE TIMES A DAY AS NEEDED FOR DIZZINESS 270 tablet 3  . omeprazole (PRILOSEC) 40 MG capsule TAKE 1 CAPSULE DAILY 90 capsule 4  . oxybutynin (DITROPAN-XL) 5 MG 24 hr tablet TAKE 1 TABLET AT BEDTIME 90 tablet 1  . rOPINIRole (REQUIP) 1 MG tablet Take 1 tablet (1 mg total) by mouth daily. 90 tablet 1  . sertraline (ZOLOFT) 100 MG tablet TAKE 1 TABLET DAILY 90 tablet 4  . simvastatin (ZOCOR) 20 MG tablet TAKE 1 TABLET EVERY EVENING 90 tablet 1  . TOPROL XL 50 MG 24 hr tablet TAKE 1 TABLET DAILY. TAKE WITH OR IMMEDIATELY FOLLOWING A MEAL 90 tablet 1  . XARELTO 20 MG TABS tablet TAKE 1 TABLET DAILY WITH SUPPER 90 tablet 3  . colchicine 0.6 MG tablet Take 1 tablet (0.6 mg total) by mouth daily as needed (Gout attacks). 30 tablet 0  . indomethacin (INDOCIN) 50 MG capsule Take 1 capsule (50 mg total) by mouth 2 (two) times daily with a meal. (Patient not taking: Reported on 08/06/2019) 30 capsule 0   No facility-administered medications prior to visit.    No Known Allergies  ROS As per HPI  PE: Blood pressure 93/68, pulse 77, temperature 97.6 F (36.4 C), temperature source Temporal, resp. rate 16, height 5\' 11"  (1.803 m), weight 229 lb 12.8 oz (104.2 kg), SpO2 98 %. Gen: Alert, well appearing.  Patient is oriented to person, place, time, and situation. AFFECT: pleasant, lucid thought and speech. Right knee with mild diffuse soft tissue swelling and generalized TTP, particularly over distal quad tendon. No tenderness over patella.  Mild warmth to palpation. Very subtle pinkish hue to R knee.   Pain with extension but this is fully intact.  Pain worse with R knee flexion, limited to 90 deg.  LABS:  Lab Results  Component Value Date   LABURIC 4.6 02/19/2017     Chemistry      Component Value Date/Time  NA 139 05/27/2019 0834   NA 139 12/28/2016 1405   K 4.1 05/27/2019 0834   CL 101 05/27/2019 0834   CO2 32 05/27/2019 0834   BUN 25 (H) 05/27/2019 0834   BUN 25 12/28/2016 1405   CREATININE 1.57 (H) 05/27/2019 0834   CREATININE 1.21 (H) 06/06/2016 1312      Component Value Date/Time   CALCIUM 9.3 05/27/2019 0834   ALKPHOS 80 11/29/2018 0957   AST 14 11/29/2018 0957   ALT 12 11/29/2018 0957   BILITOT 0.5 11/29/2018 0957     Lab Results  Component Value Date   WBC 5.2 11/29/2018   HGB 16.4 11/29/2018   HCT 48.5 11/29/2018   MCV 90.0 11/29/2018   PLT 140.0 (L) 11/29/2018   Lab Results  Component Value Date   HGBA1C 9.7 (H) 05/27/2019    IMPRESSION AND PLAN:  Right knee pain.  Suspect acute gouty arthritis. He has rare flare up so no consideration of preventative med at this time. Not improving with colchicine. NSAIDs contraindicated b/c pt on xarelto. Have to resort to prednisone 40mg  qd x 5d-->would like to avoid this b/c pt has DM with poor control Continue ice and relative rest. Low purine diet reviewed.  An After Visit Summary was printed and given to the patient.  FOLLOW UP: Return if symptoms worsen or fail to improve.  Signed:  Crissie Sickles, MD           08/06/2019

## 2019-08-06 NOTE — Patient Instructions (Signed)

## 2019-08-15 ENCOUNTER — Telehealth: Payer: Self-pay | Admitting: Family Medicine

## 2019-08-15 NOTE — Telephone Encounter (Signed)
Patient called to say that [facility] told him that Dr. Anitra Lauth cancelled his  Novolog  Please call [phone].  Patient is aware that because of time of call today 1/22 at 4:35PM, he would not get a return call until Monday 1/25.  Patient is not out of meds and has plenty of novolog

## 2019-08-15 NOTE — Telephone Encounter (Signed)
Patient advised RF's for Novolog sent to [place], [employer]. He would like to use [facility] for all maintenance meds. Advised we could address refills during visit next week. He voiced understanding.

## 2019-08-22 ENCOUNTER — Other Ambulatory Visit: Payer: Self-pay

## 2019-08-22 ENCOUNTER — Encounter: Payer: Self-pay | Admitting: Family Medicine

## 2019-08-22 ENCOUNTER — Ambulatory Visit (INDEPENDENT_AMBULATORY_CARE_PROVIDER_SITE_OTHER): Payer: Medicare Other | Admitting: Family Medicine

## 2019-08-22 VITALS — BP 85/57 | HR 74 | Temp 98.3°F | Resp 16 | Ht 71.0 in | Wt 237.2 lb

## 2019-08-22 DIAGNOSIS — Z0000 Encounter for general adult medical examination without abnormal findings: Secondary | ICD-10-CM | POA: Diagnosis not present

## 2019-08-22 MED ORDER — NOVOLOG FLEXPEN 100 UNIT/ML ~~LOC~~ SOPN: PEN_INJECTOR | SUBCUTANEOUS | 6 refills | Status: DC

## 2019-08-22 NOTE — Addendum Note (Signed)
Addended by: Tammi Sou on: 08/22/2019 10:20 AM   Modules accepted: Orders

## 2019-08-22 NOTE — Progress Notes (Signed)
The patient is here for annual Medicare wellness examination and management of other chronic and acute problems. Other problems discussed today: ongoing pain in right knee. A/P as of last visit 2 wks ago: Right knee pain.  Suspect acute gouty arthritis. He has rare flare up so no consideration of preventative med at this time. Not improving with colchicine. NSAIDs contraindicated b/c pt on xarelto. Have to resort to prednisone 40mg  qd x 5d-->would like to avoid this b/c pt has DM with poor control Continue ice and relative rest. Low purine diet reviewed.  Interim hx:  Feeling fine. Wants to discuss knee today but we deferred this.   DG R knee 2018: IMPRESSION: Minimal degenerative changes in the patellofemoral compartment  Past Medical History:  Diagnosis Date  . AICD (automatic cardioverter/defibrillator) present 2018   MDT CRT-D.  Fatigue-->completely pacer dependent.  Pacer settings adjusted 12/2017 to allow more chronotrophc variance with ADL's//exertion.  . Arthritis    Pt is s/p eft reverse total shoulder arthroplasty.  . Balanitis    chronic fungal  . BPH (benign prostatic hypertrophy) 06/2011   Irritative sx's; pt declined trial of anticholinergic per Urology records  . Chronic combined systolic and diastolic heart failure ([place]) 05/31/2012   Nonischemic:  EF 40-45%, LA mod-severe dilated, AFIB.   02/2016 EF 40%, diffuse hypokinesis, grade 2 DD.  Myoc perf imaging showed EF 32% 04/2016.  Pt upgraded to CRT-D 01/04/17.  [name] Chronic renal insufficiency, stage III (moderate) 2015   CrCl about 60 ml/min  . Complete heart block (HCC)    Has dual chamber pacer.  . Depression   . DOE (dyspnea on exertion)    NYHA class II/III CHF  . Episodic low back pain 01/22/2013   w/intermittent radiculitis (12/2014 his neurologist referred him to pain mgmt for epidural steroid injection)  . Erectile dysfunction 2019   due to zoloft--urol rx'd viagra  . GERD (gastroesophageal reflux disease)    . H/O tilt table evaluation 11/02/05   negative  . Helicobacter pylori gastritis 01/2016  . History of adenomatous polyp of colon 10/12/11   Dr. [name] (3 right side of colon- tubular adenomas removed)  . History of cardiovascular stress test 05/28/12   no ischemia, EF 37%, imaging results are unchanged and within normal variance  . History of chronic prostatitis   . History of kidney stones   . History of vertigo    + Hx of posterior HA's.  Neuro (Dr. Erling Cruz) eval 2011.  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  . Hyperlipidemia   . Hypertension   . Lumbar spondylosis    lumbosacral radiculopathy at L4 by EMG testing, right foot drop (neurologist is Dr. Linus Salmons with [facility] in [place])--neurologist referred him to neurosurgery  . Migraine    "used to have them all the time; none for years" (01/04/2017)  . Myocardial infarction [facility]) ?1970s   not entirely certain of this  . Nephrolithiasis 07/2012   Left UVJ 2 mm stone with dilation of renal collecting system and slight hydroureter on right  . Neuropathy   . NICM (nonischemic cardiomyopathy) ([place])    a. 02/2018 Cath: LM nl, LAD min irregs, LCX no, RCA 20d. [id number]. Fick CO/CI 4.4/2.0.  . 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  Since no CAD on cath 05/2016, cards recommends upgrade to CRT-D.  [name] Permanent atrial fibrillation ([place])    DCCV 07/09/13-converted, lasted two  days, then back into afib--needs lifetime anticoagulation (Xarelto as of 09/2014)  . Prostate cancer screening 09/2017   done by urol annually (normal prostate exam documented + PSA 0.84 as of 10/01/17 urol f/u.  10/2018 urol f/u PSA 0.6, no prostate nodule.  . Rectus diastasis   . Right ankle sprain 08/2017   w/distal fibula avulsion fx noted on u/s but not plain film-(Dr. Hudnall).  . Skin cancer of arm, left    "burned it off"  (01/04/2017)  . TIA (transient ischemic attack)    L face and L arm weakness. Peri procedural->a. 03/22/2018 following cath. CT head neg. No MRI b/c has pacer. Likely due to embolus to distal branch of RMCA  . Type II diabetes mellitus ([place])    Past Surgical History:  Procedure Laterality Date  . ABI's Bilateral 05/21/2018   normal  . BACK SURGERY    . BIV ICD INSERTION CRT-D N/A 01/04/2017   Procedure: BiV ICD ;  Surgeon: Constance Haw, MD;  Location: [place] [facility];  Service: Cardiovascular;  Laterality: N/A;  . CARDIAC CATHETERIZATION N/A 06/14/2016   Minimal nonobstructive dz, EF 25-35%.  Procedure: Left Heart Cath and Coronary Angiography;  Surgeon: Peter M Martinique, MD;  Location: [place] [facility];  Service: Cardiovascular;  Laterality: N/A;  . CARDIOVASCULAR STRESS TEST  2012   2012 nuclear perfusion study: low risk scan; 04/2016 normal myocardial perfusion imaging, EF 32%.  [name] CARDIOVERSION  07/09/2012   Procedure: CARDIOVERSION;  Surgeon: [name], MD;  Location: [place];  Service: Cardiovascular;  Laterality: N/A;  . CATARACT EXTRACTION W/ INTRAOCULAR LENS IMPLANT & ANTERIOR VITRECTOMY, BILATERAL Bilateral   . COLONOSCOPY W/ POLYPECTOMY  approx 2006; repeated 09/2011   Polyps on 2013 EGD as well, repeat 12/2014  . ESOPHAGOGASTRODUODENOSCOPY  10/18/06   Done due to chronic GERD: Normal, bx showed no barrett's esophagus (Dr. [name])  . FLEXOR TENDON REPAIR Left 10/02/2016   Procedure: LEFT RING FINGER WOUND EXPORATION AND FLEXOR TENDON REPAIR AND NERVE REPAIR;  Surgeon: Milly Jakob, MD;  Location: [place];  Service: Orthopedics;  Laterality: Left;  . INSERT / REPLACE / REMOVE PACEMAKER  02/05/2012   dual chamber, sinus node dysfunction, sinus arrest, PAF, Medtronic Revo serial#[id number] H: last checked 05/2015  . LUMBAR LAMINECTOMY Left 1976   L4-5  . PACEMAKER REMOVAL  01/04/2017  . PERMANENT PACEMAKER INSERTION N/A 02/05/2012   Procedure: PERMANENT PACEMAKER  INSERTION;  Surgeon: [name], MD; Generator Medtronic Revo model [place] serial number [id number] H Laterality: N/A;  . RETINAL DETACHMENT SURGERY Left ~ 1999  . REVERSE SHOULDER ARTHROPLASTY Left 2018   Left shoulder reverse [name] [place]  [name] RIGHT/LEFT HEART CATH AND CORONARY ANGIOGRAPHY N/A 03/22/2018   EF 30-35%, no CAD.  Procedure: RIGHT/LEFT HEART CATH AND CORONARY ANGIOGRAPHY;  Surgeon: Jolaine Artist, MD;  Location: [place] [facility];  Service: Cardiovascular;  Laterality: N/A;  . TRANSTHORACIC ECHOCARDIOGRAM  08/25/10; 05/2012; 03/23/16;12/2017   mild asymmetric LVH, normal systolic function, normal diastolic fxn, mild-to-mod mitral regurg, mild aortic valve sclerosis and trace AI, mild aortic root dilatation. 2014 f/u showed EF 40-45%, mod LAE, A FIB.  02/2016 EF 40%, diffuse hypokinesis, grade 2 DD. 12/2017 EF 35-40%,diffuse hypokin,grd III DD, mild MR   Social History   Socioeconomic History  . Marital status: Married    Spouse name: Not on file  . Number of children: Not on file  . Years of education: Not on file  . Highest education level:  Not on file  Occupational History  . Not on file  Tobacco Use  . Smoking status: Never Smoker  . Smokeless tobacco: Never Used  Substance and Sexual Activity  . Alcohol use: Yes    Comment: occ  . Drug use: No  . Sexual activity: Not on file  Other Topics Concern  . Not on file  Social History Narrative  . Not on file   Social Determinants of Health   Financial Resource Strain:   . Difficulty of Paying Living Expenses: Not on file  Food Insecurity:   . Worried About Charity fundraiser in the Last Year: Not on file  . Ran Out of Food in the Last Year: Not on file  Transportation Needs:   . Lack of Transportation (Medical): Not on file  . Lack of Transportation (Non-Medical): Not on file  Physical Activity:   . Days of Exercise per Week: Not on file  . Minutes of Exercise per Session: Not on file   Stress:   . Feeling of Stress : Not on file  Social Connections:   . Frequency of Communication with Friends and Family: Not on file  . Frequency of Social Gatherings with Friends and Family: Not on file  . Attends Religious Services: Not on file  . Active Member of Clubs or Organizations: Not on file  . Attends Archivist Meetings: Not on file  . Marital Status: Not on file    Lab Results  Component Value Date   TSH 2.963 04/04/2018   Lab Results  Component Value Date   WBC 5.2 11/29/2018   HGB 16.4 11/29/2018   HCT 48.5 11/29/2018   MCV 90.0 11/29/2018   PLT 140.0 (L) 11/29/2018   Lab Results  Component Value Date   CREATININE 1.57 (H) 05/27/2019   BUN 25 (H) 05/27/2019   NA 139 05/27/2019   K 4.1 05/27/2019   CL 101 05/27/2019   CO2 32 05/27/2019   Lab Results  Component Value Date   ALT 12 11/29/2018   AST 14 11/29/2018   ALKPHOS 80 11/29/2018   BILITOT 0.5 11/29/2018   Lab Results  Component Value Date   CHOL 162 11/29/2018   Lab Results  Component Value Date   HDL 32.70 (L) 11/29/2018   Lab Results  Component Value Date   LDLCALC 73 03/23/2018   Lab Results  Component Value Date   TRIG 267.0 (H) 11/29/2018   Lab Results  Component Value Date   CHOLHDL 5 11/29/2018   Lab Results  Component Value Date   HGBA1C 9.7 (H) 05/27/2019    BP (!) 85/57 (BP Location: Left Arm, Patient Position: Sitting, Cuff Size: Large)   Pulse 74   Temp 98.3 F (36.8 C) (Temporal)   Resp 16   Ht 5\' 11"  (1.803 m)   Wt 237 lb 3.2 oz (107.6 kg)   SpO2 99%   BMI 33.08 kg/m    AWV DATA The risk factors are reflected in the social history.  The roster of all physicians providing medical care to patient is listed in the Snapshot section of the chart.  Activities of daily living:  The patient is 100% independent in all ADLs: dressing, toileting, feeding as well as independent mobility.  Home safety : The patient has smoke detectors in the home. They  wear seatbelts. No firearms at home ( firearms are present in the home, kept in a safe fashion). There is no violence in the home.  There is no risks for hepatitis, STDs or HIV. There is no history of blood transfusion. They have no travel history to infectious disease endemic areas of the world.  The patient has seen their dentist in the last six month. They have seen their eye doctor in the last year. They deny any hearing difficulty and have not had audiologic testing in the last year.  They do not  have excessive sun exposure. Discussed the need for sun protection: hats, long sleeves and use of sunscreen if there is significant sun exposure.   Diet: the importance of a healthy diet is discussed. They do have a healthy (unhealthy-high fat/fast food) diet.  The patient has no regular exercise program.  The benefits of regular aerobic exercise were discussed.  Depression screen: there are no signs or vegative symptoms of depression- irritability, change in appetite, anhedonia, sadness/tearfullness. Depression screen [facility] 2/9 02/26/2019  Decreased Interest 0  Down, Depressed, Hopeless 0  PHQ - 2 Score 0  Altered sleeping 0  Tired, decreased energy 3  Change in appetite 1  Feeling bad or failure about yourself  0  Trouble concentrating 0  Moving slowly or fidgety/restless 0  Suicidal thoughts 0  PHQ-9 Score 4  Difficult doing work/chores Not difficult at all  Some recent data might be hidden    Fall Risk  07/30/2018 07/27/2017 06/26/2016 04/26/2016 04/22/2015  Falls in the past year? 1 Yes Yes No No  Comment dizzy, fell at shed; "chicken work" - - - -  Number falls in past yr: 1 1 2  or more - -  Injury with Fall? 0 No No - -  Risk for fall due to : Impaired balance/gait - - - -  Follow up Falls prevention discussed Falls prevention discussed Falls prevention discussed - -   Cognitive assessment: the patient manages all their financial and personal affairs and is actively engaged. They could  relate day,date,year and events; recalled 3/3 objects at 3 minutes; performed clock-face test normally. Reviewed advance directives with pt today.  The following portions of the patient's history were reviewed and updated as appropriate: allergies, current medications, past family history, past medical history,  past surgical history, past social history  and problem list.  Vision, hearing, body mass index were assessed and reviewed.   During the course of the visit the patient was educated and counseled about appropriate screening and preventive services including :  Annual wellness visit: today.  diabetes screening: n/a ->pt with DM 2. colorectal cancer screening: hx of polyps, most recent rpt colonoscopy was due approx 2016->he wants to wait to pursue repeat until covid 19 crisis has calmed down. recommended immunizations (influenza, pneumococcal, Hep B): all UTD.  He is [facility]. Bone mass measurement: n/a Counseling to prevent tobacco use: n/a Depression screening: today, periodic f/u visits. Glaucoma screening: eye MD Hepatitis C virus screening: declines HIV virus screening: declines Lung cancer screening: pt does not qualify/not indicated. Medical nutrition therapy: has had for DM already. Prostate cancer screening: no longer indicated. Screening mammography: n/a Screening pap tests, pelvic exam, and clinical breast exam: n/a Ultrasound screening for AAA->pt does not qualify/not indicated.  A written plan of action regarding the above screening and preventative services was given to the patient today.  No new plan except I gave pt "blue book" with info and forms on advanced directives and HC POA and encouraged him to complete this.  F/u: 3 mo RCI  Appt at his convenience for f/u knee pain that we did not discuss today.  Signed:  Crissie Sickles, MD           08/22/2019

## 2019-08-28 ENCOUNTER — Encounter: Payer: Self-pay | Admitting: Family Medicine

## 2019-08-28 ENCOUNTER — Ambulatory Visit (INDEPENDENT_AMBULATORY_CARE_PROVIDER_SITE_OTHER): Payer: Medicare Other | Admitting: Family Medicine

## 2019-08-28 ENCOUNTER — Other Ambulatory Visit: Payer: Self-pay

## 2019-08-28 VITALS — BP 103/72 | HR 89 | Temp 98.0°F | Resp 16 | Ht 71.0 in | Wt 237.2 lb

## 2019-08-28 DIAGNOSIS — M76899 Other specified enthesopathies of unspecified lower limb, excluding foot: Secondary | ICD-10-CM

## 2019-08-28 NOTE — Progress Notes (Signed)
OFFICE VISIT  08/28/2019   CC:  Chief Complaint  Patient presents with  . Knee Pain    right, x2 weeks   HPI:    Patient is a 79 y.o. Caucasian male who presents for knee pain. Pain x 2 wks in area of distal quad/prox quad tendon.  Pain a bit better now.  No preceding trauma or strain. No swelling, erythema, or heat.  Past Medical History:  Diagnosis Date  . AICD (automatic cardioverter/defibrillator) present 2018   MDT CRT-D.  Fatigue-->completely pacer dependent.  Pacer settings adjusted 12/2017 to allow more chronotrophc variance with ADL's//exertion.  . Arthritis    Pt is s/p eft reverse total shoulder arthroplasty.  . Balanitis    chronic fungal  . BPH (benign prostatic hypertrophy) 06/2011   Irritative sx's; pt declined trial of anticholinergic per Urology records  . Chronic combined systolic and diastolic heart failure ([place]) 05/31/2012   Nonischemic:  EF 40-45%, LA mod-severe dilated, AFIB.   02/2016 EF 40%, diffuse hypokinesis, grade 2 DD.  Myoc perf imaging showed EF 32% 04/2016.  Pt upgraded to CRT-D 01/04/17.  [name] Chronic renal insufficiency, stage III (moderate) 2015   CrCl about 60 ml/min  . Complete heart block (HCC)    Has dual chamber pacer.  . Depression   . DOE (dyspnea on exertion)    NYHA class II/III CHF  . Episodic low back pain 01/22/2013   w/intermittent radiculitis (12/2014 his neurologist referred him to pain mgmt for epidural steroid injection)  . Erectile dysfunction 2019   due to zoloft--urol rx'd viagra  . GERD (gastroesophageal reflux disease)   . H/O tilt table evaluation 11/02/05   negative  . Helicobacter pylori gastritis 01/2016  . History of adenomatous polyp of colon 10/12/11   Dr. [name] (3 right side of colon- tubular adenomas removed)  . History of cardiovascular stress test 05/28/12   no ischemia, EF 37%, imaging results are unchanged and within normal variance  . History of chronic prostatitis   . History of kidney stones   . History of  vertigo    + Hx of posterior HA's.  Neuro (Dr. Erling Cruz) eval 2011.  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  . Hyperlipidemia   . Hypertension   . Lumbar spondylosis    lumbosacral radiculopathy at L4 by EMG testing, right foot drop (neurologist is Dr. Linus Salmons with [facility] in [place])--neurologist referred him to neurosurgery  . Migraine    "used to have them all the time; none for years" (01/04/2017)  . Myocardial infarction [facility]) ?1970s   not entirely certain of this  . Nephrolithiasis 07/2012   Left UVJ 2 mm stone with dilation of renal collecting system and slight hydroureter on right  . Neuropathy   . NICM (nonischemic cardiomyopathy) ([place])    a. 02/2018 Cath: LM nl, LAD min irregs, LCX no, RCA 20d. [id number]. Fick CO/CI 4.4/2.0.  . 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  Since no CAD on cath 05/2016, cards recommends upgrade to CRT-D.  [name] Permanent atrial fibrillation ([place])    DCCV 07/09/13-converted, lasted two days, then back into afib--needs lifetime anticoagulation (Xarelto as of 09/2014)  . Prostate cancer screening 09/2017   done by urol annually (normal prostate exam documented + PSA 0.84 as of 10/01/17 urol f/u.  10/2018 urol f/u PSA 0.6, no prostate nodule.  . Rectus diastasis   . Right ankle sprain  08/2017   w/distal fibula avulsion fx noted on u/s but not plain film-(Dr. Hudnall).  . Skin cancer of arm, left    "burned it off" (01/04/2017)  . TIA (transient ischemic attack)    L face and L arm weakness. Peri procedural->a. 03/22/2018 following cath. CT head neg. No MRI b/c has pacer. Likely due to embolus to distal branch of RMCA  . Type II diabetes mellitus ([place])     Past Surgical History:  Procedure Laterality Date  . ABI's Bilateral 05/21/2018   normal  . BACK SURGERY    . BIV ICD INSERTION CRT-D N/A 01/04/2017   Procedure:  BiV ICD ;  Surgeon: Constance Haw, MD;  Location: [place] [facility];  Service: Cardiovascular;  Laterality: N/A;  . CARDIAC CATHETERIZATION N/A 06/14/2016   Minimal nonobstructive dz, EF 25-35%.  Procedure: Left Heart Cath and Coronary Angiography;  Surgeon: Peter M Martinique, MD;  Location: [place] [facility];  Service: Cardiovascular;  Laterality: N/A;  . CARDIOVASCULAR STRESS TEST  2012   2012 nuclear perfusion study: low risk scan; 04/2016 normal myocardial perfusion imaging, EF 32%.  [name] CARDIOVERSION  07/09/2012   Procedure: CARDIOVERSION;  Surgeon: [name], MD;  Location: [place];  Service: Cardiovascular;  Laterality: N/A;  . CATARACT EXTRACTION W/ INTRAOCULAR LENS IMPLANT & ANTERIOR VITRECTOMY, BILATERAL Bilateral   . COLONOSCOPY W/ POLYPECTOMY  approx 2006; repeated 09/2011   Polyps on 2013 EGD as well, repeat 12/2014  . ESOPHAGOGASTRODUODENOSCOPY  10/18/06   Done due to chronic GERD: Normal, bx showed no barrett's esophagus (Dr. [name])  . FLEXOR TENDON REPAIR Left 10/02/2016   Procedure: LEFT RING FINGER WOUND EXPORATION AND FLEXOR TENDON REPAIR AND NERVE REPAIR;  Surgeon: Milly Jakob, MD;  Location: [place];  Service: Orthopedics;  Laterality: Left;  . INSERT / REPLACE / REMOVE PACEMAKER  02/05/2012   dual chamber, sinus node dysfunction, sinus arrest, PAF, Medtronic Revo serial#-[id number] H: last checked 05/2015  . LUMBAR LAMINECTOMY Left 1976   L4-5  . PACEMAKER REMOVAL  01/04/2017  . PERMANENT PACEMAKER INSERTION N/A 02/05/2012   Procedure: PERMANENT PACEMAKER INSERTION;  Surgeon: [name], MD; Generator Medtronic Revo model [place] serial number [id number] H Laterality: N/A;  . RETINAL DETACHMENT SURGERY Left ~ 1999  . REVERSE SHOULDER ARTHROPLASTY Left 2018   Left shoulder reverse [name] [place]  [name] RIGHT/LEFT HEART CATH AND CORONARY ANGIOGRAPHY N/A 03/22/2018   EF 30-35%, no CAD.  Procedure: RIGHT/LEFT HEART CATH AND CORONARY ANGIOGRAPHY;   Surgeon: Jolaine Artist, MD;  Location: [place] [facility];  Service: Cardiovascular;  Laterality: N/A;  . TRANSTHORACIC ECHOCARDIOGRAM  08/25/10; 05/2012; 03/23/16;12/2017   mild asymmetric LVH, normal systolic function, normal diastolic fxn, mild-to-mod mitral regurg, mild aortic valve sclerosis and trace AI, mild aortic root dilatation. 2014 f/u showed EF 40-45%, mod LAE, A FIB.  02/2016 EF 40%, diffuse hypokinesis, grade 2 DD. 12/2017 EF 35-40%,diffuse hypokin,grd III DD, mild MR    Outpatient Medications Prior to Visit  Medication Sig Dispense Refill  . COMBIGAN 0.2-0.5 % ophthalmic solution Place 1 drop into both eyes at bedtime.     . empagliflozin (JARDIANCE) 10 MG TABS tablet Take 10 mg by mouth daily. 90 tablet 1  . ENTRESTO 97-103 MG Take 1 tablet by mouth 2 (two) times daily. 180 tablet 3  . eplerenone (INSPRA) 25 MG tablet Take 1 tablet (25 mg total) by mouth daily. 90 tablet 3  . gabapentin (NEURONTIN) 300 MG capsule 2 caps po bid  360 capsule 3  . glucose blood test strip Use to check blood sugars 1-2 times daily 100 each 3  . insulin aspart (NOVOLOG FLEXPEN) 100 UNIT/ML FlexPen 8 U SQ at the time of each meal 18 mL 6  . insulin glargine (LANTUS) 100 UNIT/ML injection 43 Units SQ qd.  Pt will be titrating his dose periodically. 30 mL 6  . Insulin Pen Needle (PEN NEEDLES) 31G X 5 MM MISC 1 each by Does not apply route daily. 100 each 0  . Insulin Syringe-Needle U-100 (EASY COMFORT INSULIN SYRINGE) 32G X 5/16" 1 ML MISC 1 each by Does not apply route daily. 100 each 2  . Lancets (FREESTYLE) lancets USE AS INSTRUCTED 100 each 11  . latanoprost (XALATAN) 0.005 % ophthalmic solution Place 1 drop into both eyes at bedtime.   12  . omeprazole (PRILOSEC) 40 MG capsule TAKE 1 CAPSULE DAILY 90 capsule 4  . oxybutynin (DITROPAN-XL) 5 MG 24 hr tablet TAKE 1 TABLET AT BEDTIME 90 tablet 1  . rOPINIRole (REQUIP) 1 MG tablet Take 1 tablet (1 mg total) by mouth daily. 90 tablet 1  . sertraline  (ZOLOFT) 100 MG tablet TAKE 1 TABLET DAILY 90 tablet 4  . simvastatin (ZOCOR) 20 MG tablet TAKE 1 TABLET EVERY EVENING 90 tablet 1  . TOPROL XL 50 MG 24 hr tablet TAKE 1 TABLET DAILY. TAKE WITH OR IMMEDIATELY FOLLOWING A MEAL 90 tablet 1  . XARELTO 20 MG TABS tablet TAKE 1 TABLET DAILY WITH SUPPER 90 tablet 3  . meclizine (ANTIVERT) 25 MG tablet TAKE 1 TABLET THREE TIMES A DAY AS NEEDED FOR DIZZINESS (Patient not taking: Reported on 08/28/2019) 270 tablet 3   No facility-administered medications prior to visit.    No Known Allergies  ROS As per HPI  PE: Blood pressure 103/72, pulse 89, temperature 98 F (36.7 C), temperature source Temporal, resp. rate 16, height 5\' 11"  (1.803 m), weight 237 lb 3.2 oz (107.6 kg), SpO2 97 %. Gen: Alert, well appearing.  Patient is oriented to person, place, time, and situation. AFFECT: pleasant, lucid thought and speech. R knee w/out swelling, erythema, or warmth. Mild TTP over distal 3 inches or so of the quad tendon.  No nodule/mass.  Full strength in R leg. Mild pain with resisted extension of R knee. No knee instability.  LABS:    Chemistry      Component Value Date/Time   NA 139 05/27/2019 0834   NA 139 12/28/2016 1405   K 4.1 05/27/2019 0834   CL 101 05/27/2019 0834   CO2 32 05/27/2019 0834   BUN 25 (H) 05/27/2019 0834   BUN 25 12/28/2016 1405   CREATININE 1.57 (H) 05/27/2019 0834   CREATININE 1.21 (H) 06/06/2016 1312      Component Value Date/Time   CALCIUM 9.3 05/27/2019 0834   ALKPHOS 80 11/29/2018 0957   AST 14 11/29/2018 0957   ALT 12 11/29/2018 0957   BILITOT 0.5 11/29/2018 0957     Lab Results  Component Value Date   WBC 5.2 11/29/2018   HGB 16.4 11/29/2018   HCT 48.5 11/29/2018   MCV 90.0 11/29/2018   PLT 140.0 (L) 11/29/2018    IMPRESSION AND PLAN:  Right quadriceps tendonitis. Resolving some lately. Stretching, heat, bengay. No rx meds.  An After Visit Summary was printed and given to the patient.  FOLLOW  UP: Return for keep appt set for April.  Signed:  Crissie Sickles, MD  08/28/2019     

## 2019-09-09 ENCOUNTER — Telehealth: Payer: Self-pay | Admitting: Cardiovascular Disease

## 2019-09-09 NOTE — Telephone Encounter (Signed)
Patient has clearance form for a dental procedure and wants Dr. Sallyanne Kuster to fill out the form and send it back as soon as possible. Patient bringing form by the office tomorrow morning. Patient will not have it faxed, he will hand deliver the form.

## 2019-09-09 NOTE — Telephone Encounter (Signed)
Patient is calling in regards to a dental clearance form he is wanting to drop off at the office in regards to him needing to have some teeth pulled. He states they are needing it singed and information in regards to his blood thinners. Advised the patient we normally request the dental office fax it over or call us in regards to clearance for the appointment, but he stated he'd rather drop it off. Please advise.

## 2019-09-10 ENCOUNTER — Other Ambulatory Visit: Payer: Self-pay | Admitting: Family Medicine

## 2019-09-10 ENCOUNTER — Other Ambulatory Visit: Payer: Self-pay | Admitting: Cardiovascular Disease

## 2019-09-10 NOTE — Telephone Encounter (Signed)
Clearance has been received and will be taken care of next week when the provider is back in the office.

## 2019-09-12 ENCOUNTER — Telehealth: Payer: Self-pay | Admitting: Family Medicine

## 2019-09-12 NOTE — Telephone Encounter (Signed)
Pt advised he would need to keep his appt for Tues, no sooner work in times available. Verbally discussed with PCP.

## 2019-09-12 NOTE — Telephone Encounter (Signed)
Pt called in and said he is having pains in his neck that are going from side to side. He thinks from his defibrillator. I scheduled him for Tues 2/23 @8am  but he wants to know if he can be worked in sooner. Please advise.

## 2019-09-14 ENCOUNTER — Other Ambulatory Visit: Payer: Self-pay | Admitting: Family Medicine

## 2019-09-15 ENCOUNTER — Other Ambulatory Visit: Payer: Self-pay

## 2019-09-15 NOTE — Telephone Encounter (Signed)
Clearance has been faxed back to [facility] at [phone] to hold Xarelto 48 hours prior to the root canal.   Left a message for the patient to inform him that it has been faxed.

## 2019-09-16 ENCOUNTER — Ambulatory Visit: Payer: Medicare Other | Admitting: Family Medicine

## 2019-09-19 ENCOUNTER — Other Ambulatory Visit: Payer: Self-pay | Admitting: Family Medicine

## 2019-09-24 ENCOUNTER — Other Ambulatory Visit: Payer: Self-pay | Admitting: Family Medicine

## 2019-09-25 DIAGNOSIS — H278 Other specified disorders of lens: Secondary | ICD-10-CM | POA: Diagnosis not present

## 2019-09-25 DIAGNOSIS — T8529XA Other mechanical complication of intraocular lens, initial encounter: Secondary | ICD-10-CM | POA: Diagnosis not present

## 2019-09-25 DIAGNOSIS — Z8669 Personal history of other diseases of the nervous system and sense organs: Secondary | ICD-10-CM | POA: Diagnosis not present

## 2019-09-25 DIAGNOSIS — H43811 Vitreous degeneration, right eye: Secondary | ICD-10-CM | POA: Diagnosis not present

## 2019-09-25 DIAGNOSIS — H4312 Vitreous hemorrhage, left eye: Secondary | ICD-10-CM | POA: Diagnosis not present

## 2019-09-29 DIAGNOSIS — L820 Inflamed seborrheic keratosis: Secondary | ICD-10-CM | POA: Diagnosis not present

## 2019-09-29 DIAGNOSIS — L218 Other seborrheic dermatitis: Secondary | ICD-10-CM | POA: Diagnosis not present

## 2019-09-30 DIAGNOSIS — H401112 Primary open-angle glaucoma, right eye, moderate stage: Secondary | ICD-10-CM | POA: Diagnosis not present

## 2019-09-30 DIAGNOSIS — Z961 Presence of intraocular lens: Secondary | ICD-10-CM | POA: Diagnosis not present

## 2019-09-30 DIAGNOSIS — H401123 Primary open-angle glaucoma, left eye, severe stage: Secondary | ICD-10-CM | POA: Diagnosis not present

## 2019-10-24 ENCOUNTER — Encounter: Payer: Self-pay | Admitting: Family Medicine

## 2019-10-27 ENCOUNTER — Encounter: Payer: Self-pay | Admitting: Family Medicine

## 2019-10-27 NOTE — Telephone Encounter (Signed)
Patient is requesting freestyle libre or dexcom reader sent to local pharmacy, [facility]. He just had medicare wellness visit on 08/22/19. Okay for this?

## 2019-10-28 ENCOUNTER — Ambulatory Visit (INDEPENDENT_AMBULATORY_CARE_PROVIDER_SITE_OTHER): Payer: Medicare Other | Admitting: *Deleted

## 2019-10-28 ENCOUNTER — Other Ambulatory Visit: Payer: Self-pay

## 2019-10-28 DIAGNOSIS — I428 Other cardiomyopathies: Secondary | ICD-10-CM

## 2019-10-28 LAB — CUP PACEART REMOTE DEVICE CHECK
Battery Remaining Longevity: 50 mo
Battery Voltage: 2.97 V
Brady Statistic AP VP Percent: 0 %
Brady Statistic AP VS Percent: 0 %
Brady Statistic AS VP Percent: 0 %
Brady Statistic AS VS Percent: 0 %
Brady Statistic RA Percent Paced: 0 %
Brady Statistic RV Percent Paced: 99.86 %
Date Time Interrogation Session: 20210406033424
HighPow Impedance: 73 Ohm
Implantable Lead Implant Date: 20130715
Implantable Lead Implant Date: 20180614
Implantable Lead Implant Date: 20180614
Implantable Lead Location: 753858
Implantable Lead Location: 753859
Implantable Lead Location: 753860
Implantable Lead Model: 4598
Implantable Pulse Generator Implant Date: 20180614
Lead Channel Impedance Value: 184.154
Lead Channel Impedance Value: 188.1 Ohm
Lead Channel Impedance Value: 195.429
Lead Channel Impedance Value: 212.8 Ohm
Lead Channel Impedance Value: 218.087
Lead Channel Impedance Value: 342 Ohm
Lead Channel Impedance Value: 361 Ohm
Lead Channel Impedance Value: 399 Ohm
Lead Channel Impedance Value: 418 Ohm
Lead Channel Impedance Value: 456 Ohm
Lead Channel Impedance Value: 513 Ohm
Lead Channel Impedance Value: 513 Ohm
Lead Channel Impedance Value: 532 Ohm
Lead Channel Impedance Value: 646 Ohm
Lead Channel Impedance Value: 646 Ohm
Lead Channel Impedance Value: 722 Ohm
Lead Channel Impedance Value: 760 Ohm
Lead Channel Impedance Value: 760 Ohm
Lead Channel Pacing Threshold Amplitude: 0.5 V
Lead Channel Pacing Threshold Amplitude: 0.625 V
Lead Channel Pacing Threshold Pulse Width: 0.4 ms
Lead Channel Pacing Threshold Pulse Width: 1 ms
Lead Channel Sensing Intrinsic Amplitude: 0.625 mV
Lead Channel Sensing Intrinsic Amplitude: 20.5 mV
Lead Channel Sensing Intrinsic Amplitude: 20.5 mV
Lead Channel Setting Pacing Amplitude: 1 V
Lead Channel Setting Pacing Amplitude: 2 V
Lead Channel Setting Pacing Pulse Width: 0.4 ms
Lead Channel Setting Pacing Pulse Width: 1 ms
Lead Channel Setting Sensing Sensitivity: 0.3 mV

## 2019-10-28 MED ORDER — FREESTYLE LIBRE SENSOR SYSTEM MISC: 1 refills | Status: DC

## 2019-10-29 NOTE — Progress Notes (Signed)
ICD Remote  

## 2019-11-13 ENCOUNTER — Other Ambulatory Visit: Payer: Self-pay

## 2019-11-13 MED ORDER — ENTRESTO 97-103 MG PO TABS: 1.0000 | ORAL_TABLET | Freq: Two times a day (BID) | ORAL | 2 refills | Status: DC

## 2019-11-13 NOTE — Telephone Encounter (Signed)
Pt called requesting a refill on Entresto, but this medication was already sent to [facility] as requested. Called [facility] to inquiry about this medication and the pharmacist stated that they did not have this medication in stock. So I sent the Rx to pt's local pharmacy and called pt and left a message informing pt of this matter and asked the pt to give our office a call back if this matter is not resolved.

## 2019-11-17 ENCOUNTER — Other Ambulatory Visit: Payer: Self-pay

## 2019-11-17 NOTE — Telephone Encounter (Signed)
Patient refill request --- needs new prescription with dosage change. gabapentin (NEURONTIN) 300 MG capsule [id number]   Patient states he is taking 4 tabs daily  [place]

## 2019-11-17 NOTE — Telephone Encounter (Signed)
RF request for Gabapentin LOV:08/28/19, acute Next ov: 11/20/19 Last written:08/29/18 (360,3)  Medication pending. Please advise, thanks.

## 2019-11-18 MED ORDER — GABAPENTIN 300 MG PO CAPS: ORAL_CAPSULE | ORAL | 3 refills | Status: DC

## 2019-11-18 NOTE — Telephone Encounter (Signed)
Refill sent.

## 2019-11-19 ENCOUNTER — Other Ambulatory Visit: Payer: Self-pay

## 2019-11-20 ENCOUNTER — Encounter: Payer: Self-pay | Admitting: Family Medicine

## 2019-11-20 ENCOUNTER — Ambulatory Visit (INDEPENDENT_AMBULATORY_CARE_PROVIDER_SITE_OTHER): Payer: Medicare Other | Admitting: Family Medicine

## 2019-11-20 ENCOUNTER — Other Ambulatory Visit: Payer: Self-pay

## 2019-11-20 VITALS — BP 100/70 | HR 90 | Temp 97.7°F | Resp 16 | Ht 71.0 in | Wt 237.6 lb

## 2019-11-20 DIAGNOSIS — N183 Chronic kidney disease, stage 3 unspecified: Secondary | ICD-10-CM | POA: Diagnosis not present

## 2019-11-20 DIAGNOSIS — E118 Type 2 diabetes mellitus with unspecified complications: Secondary | ICD-10-CM | POA: Diagnosis not present

## 2019-11-20 DIAGNOSIS — Z7901 Long term (current) use of anticoagulants: Secondary | ICD-10-CM

## 2019-11-20 DIAGNOSIS — I482 Chronic atrial fibrillation, unspecified: Secondary | ICD-10-CM | POA: Diagnosis not present

## 2019-11-20 DIAGNOSIS — I10 Essential (primary) hypertension: Secondary | ICD-10-CM

## 2019-11-20 DIAGNOSIS — E7800 Pure hypercholesterolemia, unspecified: Secondary | ICD-10-CM | POA: Diagnosis not present

## 2019-11-20 LAB — CBC WITH DIFFERENTIAL/PLATELET
Basophils Absolute: 0 10*3/uL (ref 0.0–0.1)
Basophils Relative: 0.6 % (ref 0.0–3.0)
Eosinophils Absolute: 0.2 10*3/uL (ref 0.0–0.7)
Eosinophils Relative: 3.2 % (ref 0.0–5.0)
HCT: 49.8 % (ref 39.0–52.0)
Hemoglobin: 16.3 g/dL (ref 13.0–17.0)
Lymphocytes Relative: 33.4 % (ref 12.0–46.0)
Lymphs Abs: 1.8 10*3/uL (ref 0.7–4.0)
MCHC: 32.8 g/dL (ref 30.0–36.0)
MCV: 91.2 fl (ref 78.0–100.0)
Monocytes Absolute: 0.5 10*3/uL (ref 0.1–1.0)
Monocytes Relative: 9.7 % (ref 3.0–12.0)
Neutro Abs: 2.9 10*3/uL (ref 1.4–7.7)
Neutrophils Relative %: 53.1 % (ref 43.0–77.0)
Platelets: 157 10*3/uL (ref 150.0–400.0)
RBC: 5.46 Mil/uL (ref 4.22–5.81)
RDW: 14.1 % (ref 11.5–15.5)
WBC: 5.5 10*3/uL (ref 4.0–10.5)

## 2019-11-20 LAB — COMPREHENSIVE METABOLIC PANEL
ALT: 13 U/L (ref 0–53)
AST: 16 U/L (ref 0–37)
Albumin: 4.2 g/dL (ref 3.5–5.2)
Alkaline Phosphatase: 90 U/L (ref 39–117)
BUN: 33 mg/dL — ABNORMAL HIGH (ref 6–23)
CO2: 28 mEq/L (ref 19–32)
Calcium: 9.4 mg/dL (ref 8.4–10.5)
Chloride: 103 mEq/L (ref 96–112)
Creatinine, Ser: 1.63 mg/dL — ABNORMAL HIGH (ref 0.40–1.50)
GFR: 41.2 mL/min — ABNORMAL LOW (ref >60.00)
Glucose, Bld: 246 mg/dL — ABNORMAL HIGH (ref 70–99)
Potassium: 4.6 mEq/L (ref 3.5–5.1)
Sodium: 138 mEq/L (ref 135–145)
Total Bilirubin: 0.5 mg/dL (ref 0.2–1.2)
Total Protein: 7.2 g/dL (ref 6.0–8.3)

## 2019-11-20 LAB — LIPID PANEL
Cholesterol: 166 mg/dL (ref 0–200)
HDL: 31.6 mg/dL — ABNORMAL LOW (ref >39.00)
NonHDL: 134.77
Total CHOL/HDL Ratio: 5
Triglycerides: 280 mg/dL — ABNORMAL HIGH (ref 0.0–149.0)
VLDL: 56 mg/dL — ABNORMAL HIGH (ref 0.0–40.0)

## 2019-11-20 LAB — MICROALBUMIN / CREATININE URINE RATIO
Creatinine,U: 71.7 mg/dL
Microalb Creat Ratio: 1 mg/g (ref 0.0–30.0)
Microalb, Ur: <0.7 mg/dL (ref 0.0–1.9)

## 2019-11-20 LAB — HEMOGLOBIN A1C: Hgb A1c MFr Bld: 9.9 % — ABNORMAL HIGH (ref 4.6–6.5)

## 2019-11-20 LAB — LDL CHOLESTEROL, DIRECT: Direct LDL: 91 mg/dL

## 2019-11-20 NOTE — Progress Notes (Signed)
OFFICE VISIT  11/20/2019   CC:  Chief Complaint  Patient presents with  . Follow-up    RCI, pt is fasting   HPI:    Patient is a 79 y.o. Caucasian male who presents for f/u DM 2, HTN, HLD, CRI III. He has nonischemic CM (syst and diast dysfxn) and has CRT-D, also chronic a-fib and complete heart block (pacer dependent),andconsistently has NYHA class II/III sx's. Most recent CHF clinic visit was 08/2018:all stable, remains on max medical mgmt + CRT.  A/P as of last visit: "1) DM 2, control sounds improved, still working on dietary issues. No changes in insulin at this time.  Plan on lab visit for A1c and BMET in 1 wk. Feet: c/w DPN.  Discussed. Cannot feel PT or DP pulses bilat->pt asymptomatic, no hx of LE ulcers. No vascular testing for now.  2) HTN: The current medical regimen is effective;  continue present plan and medications. BMET 1 wk.  3) HLD: tolerating simvastatin 20mg  qd. Last LDL 5 mo ago was 73.  HDL 33. AST/ALT normsal 5 mo ago. Plan repeat hepatic panel and FLP 6 mo.  4) CRI III: discussed more in depth with pt today. Avoiding NSAIDs.  Careful fluid balance-he seems to be doing better with adequate water intake lately.  5) Cardiac: A-fib/CRT/pacing/CHF/chronic anticoag: stable. Has cardiology f/u."  Interim hx: HbA1c 9.7% last visit->I recommended increase lantus to 43 U qhs and inc novolog to 8 U qAC. He has titrated this to 46 U and 10 U respectively. Gluc's at home: "better if I don't eat certain foods".  Glucose 226 this morning after eating ice cream last night.  A good number for him is 120.  Occ gluc drop to 70-100 range (mildly symptomatic), happens when he doesn't eat adequately.  Drinks OJ or takes sugar pill.  CRI: fluid intake is about 40 oz water/day.  Has a couple beers most nights, 1 cup coffee/day.  Taking xarelto as directed, no sign of bleeding.  No falls.  HLD: tolerating statin, he admits his diet could use some work as usual. His  physical activity is somewhat limited due to his CHF but he is not sedentary.  HTN: no home bp monitoring.  ROS: no fevers, no CP, no SOB, no wheezing, no cough, no dizziness, no HAs, no rashes, no melena/hematochezia.  No polyuria or polydipsia.  No myalgias or arthralgias.  No focal weakness, paresthesias, or tremors.  No acute vision or hearing abnormalities. No n/v/d or abd pain.  No palpitations.    Past Medical History:  Diagnosis Date  . AICD (automatic cardioverter/defibrillator) present 2018   MDT CRT-D.  Fatigue-->completely pacer dependent.  Pacer settings adjusted 12/2017 to allow more chronotrophc variance with ADL's//exertion.  . Arthritis    Pt is s/p eft reverse total shoulder arthroplasty.  . Balanitis    chronic fungal  . BPH (benign prostatic hypertrophy) 06/2011   Irritative sx's; pt declined trial of anticholinergic per Urology records  . Chronic combined systolic and diastolic heart failure ([place]) 05/31/2012   Nonischemic:  EF 40-45%, LA mod-severe dilated, AFIB.   02/2016 EF 40%, diffuse hypokinesis, grade 2 DD.  Myoc perf imaging showed EF 32% 04/2016.  Pt upgraded to CRT-D 01/04/17.  [name] Chronic renal insufficiency, stage III (moderate) 2015   CrCl about 60 ml/min  . Complete heart block ([facility])    Has dual chamber pacer.  . Depression   . DOE (dyspnea on exertion)    NYHA class II/III CHF  .  Episodic low back pain 01/22/2013   w/intermittent radiculitis (12/2014 his neurologist referred him to pain mgmt for epidural steroid injection)  . Erectile dysfunction 2019   due to zoloft--urol rx'd viagra  . GERD (gastroesophageal reflux disease)   . H/O tilt table evaluation 11/02/05   negative  . Helicobacter pylori gastritis 01/2016  . History of adenomatous polyp of colon 10/12/11   Dr. [name] (3 right side of colon- tubular adenomas removed)  . History of cardiovascular stress test 05/28/12   no ischemia, EF 37%, imaging results are unchanged and within normal variance  .  History of chronic prostatitis   . History of kidney stones   . History of vertigo    + Hx of posterior HA's.  Neuro (Dr. Erling Cruz) eval 2011.  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  . Hyperlipidemia   . Hypertension   . Lumbar spondylosis    lumbosacral radiculopathy at L4 by EMG testing, right foot drop (neurologist is Dr. Linus Salmons with [facility] in [place])--neurologist referred him to neurosurgery  . Migraine    "used to have them all the time; none for years" (01/04/2017)  . Myocardial infarction [facility]) ?1970s   not entirely certain of this  . Nephrolithiasis 07/2012   Left UVJ 2 mm stone with dilation of renal collecting system and slight hydroureter on right  . Neuropathy   . NICM (nonischemic cardiomyopathy) ([place])    a. 02/2018 Cath: LM nl, LAD min irregs, LCX no, RCA 20d. [id number]. Fick CO/CI 4.4/2.0.  . 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  Since no CAD on cath 05/2016, cards recommends upgrade to CRT-D.  [name] Permanent atrial fibrillation ([place])    DCCV 07/09/13-converted, lasted two days, then back into afib--needs lifetime anticoagulation (Xarelto as of 09/2014)  . Prostate cancer screening 09/2017   done by urol annually (normal prostate exam documented + PSA 0.84 as of 10/01/17 urol f/u.  10/2018 urol f/u PSA 0.6, no prostate nodule.  . Rectus diastasis   . Right ankle sprain 08/2017   w/distal fibula avulsion fx noted on u/s but not plain film-(Dr. Hudnall).  . Skin cancer of arm, left    "burned it off" (01/04/2017)  . TIA (transient ischemic attack)    L face and L arm weakness. Peri procedural->a. 03/22/2018 following cath. CT head neg. No MRI b/c has pacer. Likely due to embolus to distal branch of RMCA  . Type II diabetes mellitus ([place])     Past Surgical History:  Procedure Laterality Date  . ABI's Bilateral 05/21/2018   normal   . BACK SURGERY    . BIV ICD INSERTION CRT-D N/A 01/04/2017   Procedure: BiV ICD ;  Surgeon: Constance Haw, MD;  Location: [facility];  Service: Cardiovascular;  Laterality: N/A;  . CARDIAC CATHETERIZATION N/A 06/14/2016   Minimal nonobstructive dz, EF 25-35%.  Procedure: Left Heart Cath and Coronary Angiography;  Surgeon: Peter M Martinique, MD;  Location: [facility];  Service: Cardiovascular;  Laterality: N/A;  . CARDIOVASCULAR STRESS TEST  2012   2012 nuclear perfusion study: low risk scan; 04/2016 normal myocardial perfusion imaging, EF 32%.  [name] CARDIOVERSION  07/09/2012   Procedure: CARDIOVERSION;  Surgeon: [name], MD;  Location: [facility];  Service: Cardiovascular;  Laterality: N/A;  . CATARACT EXTRACTION W/ INTRAOCULAR LENS IMPLANT & ANTERIOR VITRECTOMY, BILATERAL Bilateral   . COLONOSCOPY W/ POLYPECTOMY  approx 2006; repeated 09/2011   Polyps on 2013 EGD as well, repeat 12/2014  . ESOPHAGOGASTRODUODENOSCOPY  10/18/06   Done due to chronic GERD: Normal, bx showed no barrett's esophagus (Dr. [name])  . FLEXOR TENDON REPAIR Left 10/02/2016   Procedure: LEFT RING FINGER WOUND EXPORATION AND FLEXOR TENDON REPAIR AND NERVE REPAIR;  Surgeon: Milly Jakob, MD;  Location: [facility];  Service: Orthopedics;  Laterality: Left;  . INSERT / REPLACE / REMOVE PACEMAKER  02/05/2012   dual chamber, sinus node dysfunction, sinus arrest, PAF, Medtronic Revo serial#-[id number] H: last checked 05/2015  . LUMBAR LAMINECTOMY Left 1976   L4-5  . PACEMAKER REMOVAL  01/04/2017  . PERMANENT PACEMAKER INSERTION N/A 02/05/2012   Procedure: PERMANENT PACEMAKER INSERTION;  Surgeon: [name], MD; Generator Medtronic Revo model [place] serial number [id number] H Laterality: N/A;  . RETINAL DETACHMENT SURGERY Left ~ 1999  . REVERSE SHOULDER ARTHROPLASTY Left 2018   Left shoulder reverse [name] in [place]).  [name] RIGHT/LEFT HEART CATH AND CORONARY ANGIOGRAPHY N/A 03/22/2018   EF 30-35%,  no CAD.  Procedure: RIGHT/LEFT HEART CATH AND CORONARY ANGIOGRAPHY;  Surgeon: Jolaine Artist, MD;  Location: [facility];  Service: Cardiovascular;  Laterality: N/A;  . TRANSTHORACIC ECHOCARDIOGRAM  08/25/10; 05/2012; 03/23/16;12/2017   mild asymmetric LVH, normal systolic function, normal diastolic fxn, mild-to-mod mitral regurg, mild aortic valve sclerosis and trace AI, mild aortic root dilatation. 2014 f/u showed EF 40-45%, mod LAE, A FIB.  02/2016 EF 40%, diffuse hypokinesis, grade 2 DD. 12/2017 EF 35-40%,diffuse hypokin,grd III DD, mild MR    Outpatient Medications Prior to Visit  Medication Sig Dispense Refill  . chlorhexidine (PERIDEX) 0.12 % solution 15 mLs 2 (two) times daily.    . COMBIGAN 0.2-0.5 % ophthalmic solution Place 1 drop into both eyes at bedtime.     . Continuous Blood Gluc Sensor (FREESTYLE LIBRE SENSOR SYSTEM) MISC USE TO CHECK BLOOD SUGARS 1-2 TIMES DAILY. 1 each 1  . ENTRESTO 97-103 MG Take 1 tablet by mouth 2 (two) times daily. 180 tablet 2  . eplerenone (INSPRA) 25 MG tablet TAKE 1 TABLET DAILY 90 tablet 3  . gabapentin (NEURONTIN) 300 MG capsule 2 caps po bid 360 capsule 3  . glucose blood test strip Use to check blood sugars 1-2 times daily 100 each 3  . insulin aspart (NOVOLOG FLEXPEN) 100 UNIT/ML FlexPen 8 U SQ at the time of each meal 18 mL 6  . insulin glargine (LANTUS) 100 UNIT/ML injection 43 Units SQ qd.  Pt will be titrating his dose periodically. 30 mL 6  . Insulin Pen Needle (PEN NEEDLES) 31G X 5 MM MISC 1 each by Does not apply route daily. 100 each 0  . Insulin Syringe-Needle U-100 (EASY COMFORT INSULIN SYRINGE) 32G X 5/16" 1 ML MISC 1 each by Does not apply route daily. 100 each 2  . JARDIANCE 10 MG TABS tablet TAKE 1 TABLET DAILY 90 tablet 1  . Lancets (FREESTYLE) lancets USE AS INSTRUCTED 100 each 11  . latanoprost (XALATAN) 0.005 % ophthalmic solution Place 1 drop into both eyes at bedtime.   12  . meclizine (ANTIVERT) 25 MG tablet TAKE 1  TABLET THREE TIMES A DAY AS NEEDED FOR DIZZINESS 270 tablet 3  . metoprolol succinate (TOPROL-XL) 50 MG 24 hr tablet TAKE 1 TABLET DAILY. TAKE WITH OR IMMEDIATELY FOLLOWING A MEAL 90 tablet 3  . omeprazole (PRILOSEC) 40 MG capsule TAKE 1 CAPSULE DAILY 90 capsule 4  . oxybutynin (DITROPAN-XL) 5  MG 24 hr tablet TAKE 1 TABLET AT BEDTIME 90 tablet 1  . rOPINIRole (REQUIP) 1 MG tablet Take 1 tablet (1 mg total) by mouth daily. 90 tablet 1  . sertraline (ZOLOFT) 100 MG tablet TAKE 1 TABLET DAILY 90 tablet 1  . simvastatin (ZOCOR) 20 MG tablet TAKE 1 TABLET EVERY EVENING 90 tablet 1  . XARELTO 20 MG TABS tablet TAKE 1 TABLET DAILY WITH SUPPER 90 tablet 3   No facility-administered medications prior to visit.    No Known Allergies  ROS As per HPI  PE: Blood pressure 100/70, pulse 90, temperature 97.7 F (36.5 C), temperature source Temporal, resp. rate 16, height 5\' 11"  (1.803 m), weight 237 lb 9.6 oz (107.8 kg), SpO2 98 %. Gen: Alert, well appearing.  Patient is oriented to person, place, time, and situation. AFFECT: pleasant, lucid thought and speech. CV: RRR (rate 70-75 approx), no m/r/g.   LUNGS: CTA bilat, nonlabored resps, good aeration in all lung fields. EXT: no clubbing or cyanosis.  no edema.     LABS:  Lab Results  Component Value Date   TSH 2.963 04/04/2018   Lab Results  Component Value Date   WBC 5.2 11/29/2018   HGB 16.4 11/29/2018   HCT 48.5 11/29/2018   MCV 90.0 11/29/2018   PLT 140.0 (L) 11/29/2018   Lab Results  Component Value Date   CREATININE 1.57 (H) 05/27/2019   BUN 25 (H) 05/27/2019   NA 139 05/27/2019   K 4.1 05/27/2019   CL 101 05/27/2019   CO2 32 05/27/2019   Lab Results  Component Value Date   ALT 12 11/29/2018   AST 14 11/29/2018   ALKPHOS 80 11/29/2018   BILITOT 0.5 11/29/2018   Lab Results  Component Value Date   CHOL 162 11/29/2018   Lab Results  Component Value Date   HDL 32.70 (L) 11/29/2018   Lab Results  Component Value  Date   LDLCALC 73 03/23/2018   Lab Results  Component Value Date   TRIG 267.0 (H) 11/29/2018   Lab Results  Component Value Date   CHOLHDL 5 11/29/2018   No results found for: PSA  Lab Results  Component Value Date   HGBA1C 9.7 (H) 05/27/2019    IMPRESSION AND PLAN:  1) DM 2: fair amount of dietary noncompliance. Glucoses still mostly up. Discussed plan for avoidance of hypoglycemic episodes, appropriate response to hypoglyc, etc. No insulin dosing changes at this time, will await A1c result from today. Urine microalb/cr today.  2) HTN: well controlled and not too low. Lytes/cr today.  3) HLD: tolerating statin. Diet in need of improvement. FLP and hepatic panel today.  4) CRI: fluid intake sounds adequate in the context of CHF. Avoids NSAIDs. Lytes/cr today.   5) Chronic anticoagulation for a-fib: doing fine on xarelto. CBC monitoring today. An After Visit Summary was printed and given to the patient.  FOLLOW UP: No follow-ups on file.  Signed:  Crissie Sickles, MD           11/20/2019

## 2019-11-21 ENCOUNTER — Other Ambulatory Visit: Payer: Self-pay

## 2019-11-21 MED ORDER — NOVOLOG FLEXPEN 100 UNIT/ML ~~LOC~~ SOPN: PEN_INJECTOR | SUBCUTANEOUS | 6 refills | Status: DC

## 2019-11-21 MED ORDER — INSULIN GLARGINE 100 UNIT/ML ~~LOC~~ SOLN: SUBCUTANEOUS | 6 refills | Status: DC

## 2019-12-01 ENCOUNTER — Encounter: Payer: Self-pay | Admitting: Cardiovascular Disease

## 2019-12-01 ENCOUNTER — Ambulatory Visit (INDEPENDENT_AMBULATORY_CARE_PROVIDER_SITE_OTHER): Payer: Medicare Other | Admitting: Cardiovascular Disease

## 2019-12-01 ENCOUNTER — Other Ambulatory Visit: Payer: Self-pay

## 2019-12-01 VITALS — BP 89/63 | HR 76 | Ht 71.0 in | Wt 241.0 lb

## 2019-12-01 DIAGNOSIS — I5022 Chronic systolic (congestive) heart failure: Secondary | ICD-10-CM | POA: Diagnosis not present

## 2019-12-01 DIAGNOSIS — I10 Essential (primary) hypertension: Secondary | ICD-10-CM | POA: Diagnosis not present

## 2019-12-01 DIAGNOSIS — I739 Peripheral vascular disease, unspecified: Secondary | ICD-10-CM

## 2019-12-01 DIAGNOSIS — I4821 Permanent atrial fibrillation: Secondary | ICD-10-CM

## 2019-12-01 DIAGNOSIS — Z7901 Long term (current) use of anticoagulants: Secondary | ICD-10-CM

## 2019-12-01 DIAGNOSIS — E669 Obesity, unspecified: Secondary | ICD-10-CM | POA: Diagnosis not present

## 2019-12-01 DIAGNOSIS — E1169 Type 2 diabetes mellitus with other specified complication: Secondary | ICD-10-CM | POA: Diagnosis not present

## 2019-12-01 DIAGNOSIS — Z95810 Presence of automatic (implantable) cardiac defibrillator: Secondary | ICD-10-CM

## 2019-12-01 DIAGNOSIS — I442 Atrioventricular block, complete: Secondary | ICD-10-CM | POA: Diagnosis not present

## 2019-12-01 MED ORDER — METOPROLOL SUCCINATE ER 25 MG PO TB24: 25.0000 mg | ORAL_TABLET | Freq: Every day | ORAL | 1 refills | Status: DC

## 2019-12-01 NOTE — Progress Notes (Signed)
`   Cardiology Office Note    Date:  12/02/2019   ID:  Mason [name], DOB [birth year], MRN [id number]  PCP:  Mason Sou, MD  Cardiologist:   Mason Klein, MD   No chief complaint on file.   History of Present Illness:  Mason [name] is a 79 y.o. male with nonischemic cardiomyopathy, permanent atrial fibrillation and complete heart block who underwent implantation of a CRT-D in June 2018 for deteriorating left ventricular systolic function.  He is pacemaker dependent.  Overall doing well, although he bemoans the fact that he does not have the stamina and strength that he did not younger years.  He notices that he has to stop and rest due to fatigue while doing yard work.  Sometimes he describes it as shortness of breath.  His blood pressure tends to run quite low and today systolic blood pressure was only 89, although he denies any dizziness.  He has not had syncope or near syncope.  His device does not show any change in his level of activity which remains approximately 4 hours/day, very similar to a year ago.  His OptiVol level has been very stable and has only across the "excess fluid" line for couple of days in March.  He has 100% biventricular pacing and his ECG shows a prominent R wave in lead V1 consistent with effective LV capture.  The patient specifically denies any chest pain at rest exertion, dyspnea at rest or with exertion, orthopnea, paroxysmal nocturnal dyspnea, syncope, palpitations, focal neurological deficits, intermittent claudication, lower extremity edema, unexplained weight gain, cough, hemoptysis or wheezing.  Continues have some problems with dysuria.  His device is a Medtronic Claria CRT-D device with an estimated longevity of another 4 years.  He has 99.9% effective biventricular pacing.  His OptiVol is stable.  Left ventricular pacing is in the LV 3-RV coil configuration.  He is pacemaker dependent without any escape rhythm when pacing is taken down to 30 bpm..   He has not had ventricular tachycardia, not even nonsustained events.  He is in permanent atrial fibrillation.  Interrogation of his pacemaker shows adequate heart rate histogram distribution.   Lead parameters are excellent.    Switched from spironolactone to eplerenone due to gynecomastia, with resolution of the side effects. He has had no bleeding problems and is compliant with Xarelto anticoagulation.  He has not had any injuries or falls  He does not have coronary or other vascular disorders, but has numerous risk factors including type 2 diabetes, hyperlipidemia, mild obesity. He has a history of gout.  He has gained about 20 pounds since his last appointment.  He is now taking both Lantus and Jardiance.  He is now established as 100% service connected at the [facility] and this should help with his medications, some of which are quite expensive (Xarelto, Jardiance, eplerenone, Lantus).  The cause of Mason [name]'s cardiomyopathy remains uncertain.  He initially presented with a "heart attack" and was in the ICU in the [facility] for 3 to 4 days in 1976.  He has had a depressed left ventricular systolic function for many years.  Cardiac catheterization most recently performed in 2019 does show nonobstructive atherosclerosis.[name]  He has a Water engineer including 2 stints in [name] and he knows that he was exposed to [employer].  He underwent right and left heart catheterization for optimization of heart failure therapy on March 22, 2018.  Right atrial pressure was 7 mmHg, wedge  pressure was 13 and PA pressure was 30/10 (mean 22) millimeters Hg.  The cardiac index was 2.0.  The coronary arteries had only minimal atherosclerosis.  Radial artery access was difficult.  On the catheterization he developed transient weakness of the left upper extremity and left facial droop, from which he recovered fully thankfully.  His dose of beta-blocker was reduced.  He is not on diuretics.  He  subsequently underwent cardiopulmonary stress testing on September 18 and did quite well with a mildly reduced peak VO2 of 16.8 mL/kg/min (79% of control).  There was still evidence of drug-induced chronotropic incompetence with a maximum heart rate only reached 74% of predicted.     Past Medical History:  Diagnosis Date  . AICD (automatic cardioverter/defibrillator) present 2018   MDT CRT-D.  Fatigue-->completely pacer dependent.  Pacer settings adjusted 12/2017 to allow more chronotrophc variance with ADL's//exertion.  . Arthritis    Pt is s/p eft reverse total shoulder arthroplasty.  . Balanitis    chronic fungal  . BPH (benign prostatic hypertrophy) 06/2011   Irritative sx's; pt declined trial of anticholinergic per Urology records  . Chronic combined systolic and diastolic heart failure ([place]) 05/31/2012   Nonischemic:  EF 40-45%, LA mod-severe dilated, AFIB.   02/2016 EF 40%, diffuse hypokinesis, grade 2 DD.  Myoc perf imaging showed EF 32% 04/2016.  Pt upgraded to CRT-D 01/04/17.  [name] Chronic renal insufficiency, stage III (moderate) 2015   CrCl about 60 ml/min  . Complete heart block (HCC)    Has dual chamber pacer.  . Depression   . DOE (dyspnea on exertion)    NYHA class II/III CHF  . Episodic low back pain 01/22/2013   w/intermittent radiculitis (12/2014 his neurologist referred him to pain mgmt for epidural steroid injection)  . Erectile dysfunction 2019   due to zoloft--urol rx'd viagra  . GERD (gastroesophageal reflux disease)   . H/O tilt table evaluation 11/02/05   negative  . Helicobacter pylori gastritis 01/2016  . History of adenomatous polyp of colon 10/12/11   Dr. Benson [name] (3 right side of colon- tubular adenomas removed)  . History of cardiovascular stress test 05/28/12   no ischemia, EF 37%, imaging results are unchanged and within normal variance  . History of chronic prostatitis   . History of kidney stones   . History of vertigo    + Hx of posterior HA's.  Neuro (Dr.  Erling [name]) eval 2011.  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  . Hyperlipidemia   . Hypertension   . Lumbar spondylosis    lumbosacral radiculopathy at L4 by EMG testing, right foot drop (neurologist is Dr. Linus Salmons with [facility] in [place])--neurologist referred him to neurosurgery  . Migraine    "used to have them all the time; none for years" (01/04/2017)  . Myocardial infarction [facility]) ?1970s   not entirely certain of this  . Nephrolithiasis 07/2012   Left UVJ 2 mm stone with dilation of renal collecting system and slight hydroureter on right  . Neuropathy   . NICM (nonischemic cardiomyopathy) ([place])    a. 02/2018 Cath: LM nl, LAD min irregs, LCX no, RCA 20d. :[id number]. Fick CO/CI 4.4/2.0.  . 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  Since no CAD on cath 05/2016, cards recommends upgrade to CRT-D.  [name] Permanent atrial fibrillation ([place])    DCCV 07/09/13-converted, lasted two days, then back into afib--needs lifetime anticoagulation (  Xarelto as of 09/2014)  . Prostate cancer screening 09/2017   done by urol annually (normal prostate exam documented + PSA 0.84 as of 10/01/17 urol f/u.  10/2018 urol f/u PSA 0.6, no prostate nodule.  . Rectus diastasis   . Right ankle sprain 08/2017   w/distal fibula avulsion fx noted on u/s but not plain film-(Dr. Hudnall).  . Skin cancer of arm, left    "burned it off" (01/04/2017)  . TIA (transient ischemic attack)    L face and L arm weakness. Peri procedural->a. 03/22/2018 following cath. CT head neg. No MRI b/c has pacer. Likely due to embolus to distal branch of RMCA  . Type II diabetes mellitus ([place])     Past Surgical History:  Procedure Laterality Date  . ABI's Bilateral 05/21/2018   normal  . BACK SURGERY    . BIV ICD INSERTION CRT-D N/A 01/04/2017   Procedure: BiV ICD ;  Surgeon: Constance Haw, MD;   Location: [place] [facility];  Service: Cardiovascular;  Laterality: N/A;  . CARDIAC CATHETERIZATION N/A 06/14/2016   Minimal nonobstructive dz, EF 25-35%.  Procedure: Left Heart Cath and Coronary Angiography;  Surgeon: Peter M Martinique, MD;  Location: [place] [facility];  Service: Cardiovascular;  Laterality: N/A;  . CARDIOVASCULAR STRESS TEST  2012   2012 nuclear perfusion study: low risk scan; 04/2016 normal myocardial perfusion imaging, EF 32%.  [name] CARDIOVERSION  07/09/2012   Procedure: CARDIOVERSION;  Surgeon: Mason Klein, MD;  Location: [place];  Service: Cardiovascular;  Laterality: N/A;  . CATARACT EXTRACTION W/ INTRAOCULAR LENS IMPLANT & ANTERIOR VITRECTOMY, BILATERAL Bilateral   . COLONOSCOPY W/ POLYPECTOMY  approx 2006; repeated 09/2011   Polyps on 2013 EGD as well, repeat 12/2014  . ESOPHAGOGASTRODUODENOSCOPY  10/18/06   Done due to chronic GERD: Normal, bx showed no barrett's esophagus (Dr. [name])  . FLEXOR TENDON REPAIR Left 10/02/2016   Procedure: LEFT RING FINGER WOUND EXPORATION AND FLEXOR TENDON REPAIR AND NERVE REPAIR;  Surgeon: Milly Jakob, MD;  Location: [place];  Service: Orthopedics;  Laterality: Left;  . INSERT / REPLACE / REMOVE PACEMAKER  02/05/2012   dual chamber, sinus node dysfunction, sinus arrest, PAF, Medtronic Revo serial#-[id number] H: last checked 05/2015  . LUMBAR LAMINECTOMY Left 1976   L4-5  . PACEMAKER REMOVAL  01/04/2017  . PERMANENT PACEMAKER INSERTION N/A 02/05/2012   Procedure: PERMANENT PACEMAKER INSERTION;  Surgeon: Mason [place], MD; Generator Medtronic Revo model [place] serial number [id number] H Laterality: N/A;  . RETINAL DETACHMENT SURGERY Left ~ 1999  . REVERSE SHOULDER ARTHROPLASTY Left 2018   Left shoulder reverse [name] in [place]).  [name] RIGHT/LEFT HEART CATH AND CORONARY ANGIOGRAPHY N/A 03/22/2018   EF 30-35%, no CAD.  Procedure: RIGHT/LEFT HEART CATH AND CORONARY ANGIOGRAPHY;  Surgeon: Jolaine Artist, MD;  Location: [place] [facility];  Service: Cardiovascular;  Laterality: N/A;  . TRANSTHORACIC ECHOCARDIOGRAM  08/25/10; 05/2012; 03/23/16;12/2017   mild asymmetric LVH, normal systolic function, normal diastolic fxn, mild-to-mod mitral regurg, mild aortic valve sclerosis and trace AI, mild aortic root dilatation. 2014 f/u showed EF 40-45%, mod LAE, A FIB.  02/2016 EF 40%, diffuse hypokinesis, grade 2 DD. 12/2017 EF 35-40%,diffuse hypokin,grd III DD, mild MR    Current Medications: Outpatient Medications Prior to Visit  Medication Sig Dispense Refill  . COMBIGAN 0.2-0.5 % ophthalmic solution Place 1 drop into both eyes at bedtime.     . Continuous Blood Gluc Sensor (FREESTYLE LIBRE SENSOR SYSTEM) MISC USE TO CHECK BLOOD  SUGARS 1-2 TIMES DAILY. 1 each 1  . ENTRESTO 97-103 MG Take 1 tablet by mouth 2 (two) times daily. 180 tablet 2  . eplerenone (INSPRA) 25 MG tablet TAKE 1 TABLET DAILY 90 tablet 3  . gabapentin (NEURONTIN) 300 MG capsule 2 caps po bid 360 capsule 3  . glucose blood test strip Use to check blood sugars 1-2 times daily 100 each 3  . insulin aspart (NOVOLOG FLEXPEN) 100 UNIT/ML FlexPen 10 U SQ at the time of each meal 18 mL 6  . insulin glargine (LANTUS) 100 UNIT/ML injection 49 Units SQ qd.  Pt will be titrating his dose periodically. 30 mL 6  . Insulin Pen Needle (PEN NEEDLES) 31G X 5 MM MISC 1 each by Does not apply route daily. 100 each 0  . Insulin Syringe-Needle U-100 (EASY COMFORT INSULIN SYRINGE) 32G X 5/16" 1 ML MISC 1 each by Does not apply route daily. 100 each 2  . JARDIANCE 10 MG TABS tablet TAKE 1 TABLET DAILY 90 tablet 1  . Lancets (FREESTYLE) lancets USE AS INSTRUCTED 100 each 11  . latanoprost (XALATAN) 0.005 % ophthalmic solution Place 1 drop into both eyes at bedtime.   12  . meclizine (ANTIVERT) 25 MG tablet TAKE 1 TABLET THREE TIMES A DAY AS NEEDED FOR DIZZINESS 270 tablet 3  . omeprazole (PRILOSEC) 40 MG capsule TAKE 1 CAPSULE DAILY 90 capsule 4  . oxybutynin (DITROPAN-XL) 5 MG 24  hr tablet TAKE 1 TABLET AT BEDTIME 90 tablet 1  . rOPINIRole (REQUIP) 1 MG tablet Take 1 tablet (1 mg total) by mouth daily. 90 tablet 1  . sertraline (ZOLOFT) 100 MG tablet TAKE 1 TABLET DAILY 90 tablet 1  . simvastatin (ZOCOR) 20 MG tablet TAKE 1 TABLET EVERY EVENING 90 tablet 1  . XARELTO 20 MG TABS tablet TAKE 1 TABLET DAILY WITH SUPPER 90 tablet 3  . chlorhexidine (PERIDEX) 0.12 % solution 15 mLs 2 (two) times daily.    . metoprolol succinate (TOPROL-XL) 50 MG 24 hr tablet TAKE 1 TABLET DAILY. TAKE WITH OR IMMEDIATELY FOLLOWING A MEAL 90 tablet 3   No facility-administered medications prior to visit.     Allergies:   Patient has no known allergies.   Social History   Socioeconomic History  . Marital status: Married    Spouse name: Not on file  . Number of children: Not on file  . Years of education: Not on file  . Highest education level: Not on file  Occupational History  . Not on file  Tobacco Use  . Smoking status: Never Smoker  . Smokeless tobacco: Never Used  Substance and Sexual Activity  . Alcohol use: Yes    Comment: occ  . Drug use: No  . Sexual activity: Not on file  Other Topics Concern  . Not on file  Social History Narrative  . Not on file   Social Determinants of Health   Financial Resource Strain:   . Difficulty of Paying Living Expenses:   Food Insecurity:   . Worried About Charity fundraiser in the Last Year:   . Arboriculturist in the Last Year:   Transportation Needs:   . Film/video editor (Medical):   [name] Lack of Transportation (Non-Medical):   Physical Activity:   . Days of Exercise per Week:   . Minutes of Exercise per Session:   Stress:   . Feeling of Stress :   Social Connections:   . Frequency of Communication  with Friends and Family:   . Frequency of Social Gatherings with Friends and Family:   . Attends Religious Services:   . Active Member of Clubs or Organizations:   . Attends Archivist Meetings:   [name] Marital  Status:      Family History:  The patient's family history includes Cancer in his brother, brother, and sister; Heart disease in his brother, sister, and sister; Heart failure in his mother; Stroke in his father and mother.   ROS:   Please see the history of present illness.    ROS All other systems are reviewed and are negative.   PHYSICAL EXAM:   VS:  BP (!) 89/63   Pulse 76   Ht 5\' 11"  (1.803 m)   Wt 241 lb (109.3 kg)   SpO2 97%   BMI 33.61 kg/m     General: Alert, oriented x3, no distress, healthy left subclavian device site Head: no evidence of trauma, PERRL, EOMI, no exophtalmos or lid lag, no myxedema, no xanthelasma; normal ears, nose and oropharynx Neck: normal jugular venous pulsations and no hepatojugular reflux; brisk carotid pulses without delay and no carotid bruits Chest: clear to auscultation, no signs of consolidation by percussion or palpation, normal fremitus, symmetrical and full respiratory excursions Cardiovascular: normal position and quality of the apical impulse, regular rhythm, normal first and second heart sounds, no murmurs, rubs or gallops Abdomen: no tenderness or distention, no masses by palpation, no abnormal pulsatility or arterial bruits, normal bowel sounds, no hepatosplenomegaly Extremities: no clubbing, cyanosis or edema; 2+ radial, ulnar and brachial pulses bilaterally; 2+ right femoral, posterior tibial and dorsalis pedis pulses; 2+ left femoral, posterior tibial and dorsalis pedis pulses; no subclavian or femoral bruits Neurological: grossly nonfocal Psych: Normal mood and affect   Wt Readings from Last 3 Encounters:  12/01/19 241 lb (109.3 kg)  11/20/19 237 lb 9.6 oz (107.8 kg)  08/28/19 237 lb 3.2 oz (107.6 kg)     Studies/Labs Reviewed:   EKG:  EKG is ordered today.  It shows atrial fibrillation with biventricular pacing and a prominent positive R wave in lead V1.  Recent Labs: 11/20/2019: ALT 13; BUN 33; Creatinine, Ser 1.63;  Hemoglobin 16.3; Platelets 157.0; Potassium 4.6; Sodium 138   Lipid Panel    Component Value Date/Time   CHOL 166 11/20/2019 0959   TRIG 280.0 (H) 11/20/2019 0959   HDL 31.60 (L) 11/20/2019 0959   CHOLHDL 5 11/20/2019 0959   VLDL 56.0 (H) 11/20/2019 0959   LDLCALC 73 03/23/2018 0500   LDLDIRECT 91.0 11/20/2019 0959     ASSESSMENT:    1. Permanent atrial fibrillation ([place])   2. Chronic systolic heart failure ([place])   3. CHB (complete heart block) (HCC)   4. Long term (current) use of anticoagulants   5. 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 2018   6. Essential hypertension   7. PAD (peripheral artery disease) ([place])   8. Diabetes mellitus type 2 in obese [facility])      PLAN:  In order of problems listed above:  1. CHF: He truly is on state-of-the-art medical therapy for heart failure including maximum dose Entresto, metoprolol succinate, empagliflozin and eplerenone (switched from spironolactone due to gynecomastia).  He is maintaining euvolemic status both clinically and by OptiVol, without the need for loop diuretics.  NYHA functional class II.  We will reduce the dose of beta-blocker to see if this improves his complaints of fatigue. 2. CHB: Pacemaker dependent. 3. AFib: Permanent arrhythmia.  Appropriately anticoagulated.  CHADSVasc 5 (age 59, diabetes, heart failure, hypertension). 4. Xarelto: Well-tolerated without bleeding problems. 5. CRT-D: Normal device function.  At his last complaints we adjusted the exertion rate response slope and his heart rate histogram appears to be appropriate now. 6. HTN: Excellent, even excessive control.  Denies dizziness or syncope. 7. PAD: Very mild abnormality in the left lower extremity ABI.  He is not troubled by any claudication. 8. DM/obesity/dyslipidemia: Strongly encouraged weight loss.  Limit carbohydrate intake.  This would also help with his fatigue and dyspnea and would probably improve his  hypertriglyceridemia and low HDL cholesterol levels.  Glycemic control remains poor with a 9.9% hemoglobin A1c in April 2021.    Medication Adjustments/Labs and Tests Ordered: Current medicines are reviewed at length with the patient today.  Concerns regarding medicines are outlined above.  Medication changes, Labs and Tests ordered today are listed in the Patient Instructions below. Patient Instructions  Medication Instructions:  DECREASE the Metoprolol Succinate to 25 mg once daily *If you need a refill on your cardiac medications before your next appointment, please call your pharmacy*   Lab Work: None ordered If you have labs (blood work) drawn today and your tests are completely normal, you will receive your results only by: [name] MyChart Message (if you have MyChart) OR . A paper copy in the mail If you have any lab test that is abnormal or we need to change your treatment, we will call you to review the results.   Testing/Procedures: None ordered   Follow-Up: At [facility], you and your health needs are our priority.  As part of our continuing mission to provide you with exceptional heart care, we have created designated Provider Care Teams.  These Care Teams include your primary Cardiologist (physician) and Advanced Practice Providers (APPs -  Physician Assistants and Nurse Practitioners) who all work together to provide you with the care you need, when you need it.  We recommend signing up for the patient portal called "MyChart".  Sign up information is provided on this After Visit Summary.  MyChart is used to connect with patients for Virtual Visits (Telemedicine).  Patients are able to view lab/test results, encounter notes, upcoming appointments, etc.  Non-urgent messages can be sent to your provider as well.   To learn more about what you can do with MyChart, go to [email or web address].    Your next appointment:   6 month(s)  The format for your next appointment:     In Person  Provider:   [name], MD       Signed, Mason [place], MD  12/02/2019 5:35 PM    [address], [place], [place]  [address] Phone: [phone]; Fax: [phone]

## 2019-12-01 NOTE — Patient Instructions (Signed)
Medication Instructions:  DECREASE the Metoprolol Succinate to 25 mg once daily *If you need a refill on your cardiac medications before your next appointment, please call your pharmacy*   Lab Work: None ordered If you have labs (blood work) drawn today and your tests are completely normal, you will receive your results only by: [name] MyChart Message (if you have MyChart) OR . A paper copy in the mail If you have any lab test that is abnormal or we need to change your treatment, we will call you to review the results.   Testing/Procedures: None ordered   Follow-Up: At [facility], you and your health needs are our priority.  As part of our continuing mission to provide you with exceptional heart care, we have created designated Provider Care Teams.  These Care Teams include your primary Cardiologist (physician) and Advanced Practice Providers (APPs -  Physician Assistants and Nurse Practitioners) who all work together to provide you with the care you need, when you need it.  We recommend signing up for the patient portal called "MyChart".  Sign up information is provided on this After Visit Summary.  MyChart is used to connect with patients for Virtual Visits (Telemedicine).  Patients are able to view lab/test results, encounter notes, upcoming appointments, etc.  Non-urgent messages can be sent to your provider as well.   To learn more about what you can do with MyChart, go to [email or web address].    Your next appointment:   6 month(s)  The format for your next appointment:   In Person  Provider:   [name], MD

## 2019-12-04 ENCOUNTER — Other Ambulatory Visit: Payer: Self-pay | Admitting: Family Medicine

## 2019-12-05 ENCOUNTER — Telehealth: Payer: Self-pay

## 2019-12-05 MED ORDER — PEN NEEDLES 31G X 5 MM MISC: 2 refills | Status: DC

## 2019-12-05 NOTE — Telephone Encounter (Signed)
Pt's wife called to request refill for insulin pen needles to be sent to [facility]. Verified patient would have enough until mail order received. Rx sent.

## 2019-12-09 ENCOUNTER — Other Ambulatory Visit: Payer: Self-pay | Admitting: Family Medicine

## 2019-12-09 DIAGNOSIS — N401 Enlarged prostate with lower urinary tract symptoms: Secondary | ICD-10-CM | POA: Diagnosis not present

## 2019-12-17 DIAGNOSIS — N471 Phimosis: Secondary | ICD-10-CM | POA: Diagnosis not present

## 2019-12-17 DIAGNOSIS — N481 Balanitis: Secondary | ICD-10-CM | POA: Diagnosis not present

## 2019-12-17 DIAGNOSIS — R3914 Feeling of incomplete bladder emptying: Secondary | ICD-10-CM | POA: Diagnosis not present

## 2019-12-22 ENCOUNTER — Other Ambulatory Visit: Payer: Self-pay | Admitting: Family Medicine

## 2020-01-01 ENCOUNTER — Encounter: Payer: Self-pay | Admitting: Family Medicine

## 2020-01-02 ENCOUNTER — Other Ambulatory Visit: Payer: Self-pay

## 2020-01-02 MED ORDER — FREESTYLE LIBRE 2 READER DEVI: 3 refills | Status: DC

## 2020-01-02 MED ORDER — FREESTYLE LIBRE SENSOR SYSTEM MISC: 3 refills | Status: DC

## 2020-01-02 NOTE — Telephone Encounter (Signed)
Patient recently had freestyle libre sensor system sent in to [facility] but is now requesting freestyle libre 2 instead. I'm not sure the difference.  Please advise, thanks.

## 2020-01-02 NOTE — Telephone Encounter (Signed)
Ok, [facility] covers it will you do a rx for it?

## 2020-01-06 ENCOUNTER — Telehealth: Payer: Self-pay

## 2020-01-06 ENCOUNTER — Other Ambulatory Visit: Payer: Self-pay

## 2020-01-06 MED ORDER — EMPAGLIFLOZIN 10 MG PO TABS: 10.0000 mg | ORAL_TABLET | Freq: Every day | ORAL | 0 refills | Status: DC

## 2020-01-06 NOTE — Telephone Encounter (Signed)
Rx sent in for 15 day supply to local pharmacy. Patient notified.

## 2020-01-06 NOTE — Telephone Encounter (Signed)
Patient refill request. He will need 15 d/s override Patient is completely out.  He did not realize this and [facility] cannot ship until 7/1  Per patient he will need 15 d/s override  JARDIANCE 10 MG TABS tablet [[id number]]   [facility] #[id number] - [place], [place] -

## 2020-01-27 ENCOUNTER — Ambulatory Visit (INDEPENDENT_AMBULATORY_CARE_PROVIDER_SITE_OTHER): Payer: Medicare Other | Admitting: *Deleted

## 2020-01-27 DIAGNOSIS — I4821 Permanent atrial fibrillation: Secondary | ICD-10-CM | POA: Diagnosis not present

## 2020-01-27 LAB — CUP PACEART REMOTE DEVICE CHECK
Battery Remaining Longevity: 46 mo
Battery Voltage: 2.96 V
Brady Statistic AP VP Percent: 0 %
Brady Statistic AP VS Percent: 0 %
Brady Statistic AS VP Percent: 0 %
Brady Statistic AS VS Percent: 0 %
Brady Statistic RA Percent Paced: 0 %
Brady Statistic RV Percent Paced: 99.75 %
Date Time Interrogation Session: 20210706022605
HighPow Impedance: 75 Ohm
Implantable Lead Implant Date: 20130715
Implantable Lead Implant Date: 20180614
Implantable Lead Implant Date: 20180614
Implantable Lead Location: 753858
Implantable Lead Location: 753859
Implantable Lead Location: 753860
Implantable Lead Model: 4598
Implantable Pulse Generator Implant Date: 20180614
Lead Channel Impedance Value: 193.707
Lead Channel Impedance Value: 193.707
Lead Channel Impedance Value: 201.488
Lead Channel Impedance Value: 218.087
Lead Channel Impedance Value: 218.087
Lead Channel Impedance Value: 361 Ohm
Lead Channel Impedance Value: 361 Ohm
Lead Channel Impedance Value: 418 Ohm
Lead Channel Impedance Value: 418 Ohm
Lead Channel Impedance Value: 456 Ohm
Lead Channel Impedance Value: 513 Ohm
Lead Channel Impedance Value: 532 Ohm
Lead Channel Impedance Value: 532 Ohm
Lead Channel Impedance Value: 665 Ohm
Lead Channel Impedance Value: 665 Ohm
Lead Channel Impedance Value: 760 Ohm
Lead Channel Impedance Value: 779 Ohm
Lead Channel Impedance Value: 817 Ohm
Lead Channel Pacing Threshold Amplitude: 0.5 V
Lead Channel Pacing Threshold Amplitude: 0.75 V
Lead Channel Pacing Threshold Pulse Width: 0.4 ms
Lead Channel Pacing Threshold Pulse Width: 1 ms
Lead Channel Sensing Intrinsic Amplitude: 0.625 mV
Lead Channel Sensing Intrinsic Amplitude: 20.5 mV
Lead Channel Sensing Intrinsic Amplitude: 20.5 mV
Lead Channel Setting Pacing Amplitude: 1 V
Lead Channel Setting Pacing Amplitude: 2 V
Lead Channel Setting Pacing Pulse Width: 0.4 ms
Lead Channel Setting Pacing Pulse Width: 1 ms
Lead Channel Setting Sensing Sensitivity: 0.3 mV

## 2020-01-28 ENCOUNTER — Other Ambulatory Visit: Payer: Self-pay

## 2020-01-28 ENCOUNTER — Encounter: Payer: Self-pay | Admitting: Family Medicine

## 2020-01-28 MED ORDER — FREESTYLE LIBRE SENSOR SYSTEM MISC: 3 refills | Status: DC

## 2020-01-28 MED ORDER — FREESTYLE LIBRE 2 READER DEVI: 3 refills | Status: DC

## 2020-01-28 NOTE — Progress Notes (Signed)
Remote ICD transmission.   

## 2020-01-29 ENCOUNTER — Other Ambulatory Visit: Payer: Self-pay | Admitting: Family Medicine

## 2020-02-19 ENCOUNTER — Other Ambulatory Visit: Payer: Self-pay | Admitting: Family Medicine

## 2020-02-19 ENCOUNTER — Other Ambulatory Visit: Payer: Self-pay

## 2020-02-19 MED ORDER — SERTRALINE HCL 100 MG PO TABS: 100.0000 mg | ORAL_TABLET | Freq: Every day | ORAL | 1 refills | Status: DC

## 2020-02-23 ENCOUNTER — Other Ambulatory Visit: Payer: Self-pay | Admitting: Cardiovascular Disease

## 2020-03-08 DIAGNOSIS — N471 Phimosis: Secondary | ICD-10-CM | POA: Diagnosis not present

## 2020-03-08 DIAGNOSIS — N481 Balanitis: Secondary | ICD-10-CM | POA: Diagnosis not present

## 2020-03-10 ENCOUNTER — Other Ambulatory Visit: Payer: Self-pay

## 2020-03-11 ENCOUNTER — Telehealth: Payer: Self-pay | Admitting: Cardiovascular Disease

## 2020-03-11 NOTE — Telephone Encounter (Signed)
New Message     [facility] Pre-operative Risk Assessment    HEARTCARE STAFF: - Please ensure there is not already an duplicate clearance open for this procedure. - Under Visit Info/Reason for Call, type in Other and utilize the format Clearance MM/DD/YY or Clearance TBD. Do not use dashes or single digits. - If request is for dental extraction, please clarify the # of teeth to be extracted.  Request for surgical clearance:  1. What type of surgery is being performed? Circumcison    2. When is this surgery scheduled? TBD   3. What type of clearance is required (medical clearance vs. Pharmacy clearance to hold med vs. Both)? Both  4. Are there any medications that need to be held prior to surgery and how long? Xarelto for 72 hours prior    5. Practice name and name of physician performing surgery? Dr. Milford Cage, [facility]    6. What is the office phone number? [phone] ext [phone]   7.   What is the office fax number? [phone]  8.   Anesthesia type (None, local, MAC, general) ? General    Mason [name] 03/11/2020, 12:08 PM  _________________________________________________________________   (provider comments below)

## 2020-03-12 ENCOUNTER — Other Ambulatory Visit: Payer: Self-pay | Admitting: Family Medicine

## 2020-03-14 NOTE — Telephone Encounter (Signed)
Clinical pharmacist to review Xarelto 

## 2020-03-15 ENCOUNTER — Encounter: Payer: Self-pay | Admitting: Cardiovascular Disease

## 2020-03-15 ENCOUNTER — Other Ambulatory Visit: Payer: Self-pay | Admitting: Urology

## 2020-03-15 NOTE — Telephone Encounter (Signed)
Okay to hold Xarelto for 48 hours before the procedure.

## 2020-03-15 NOTE — Patient Instructions (Addendum)
DUE TO COVID-19 ONLY ONE VISITOR IS ALLOWED TO COME WITH YOU AND STAY IN THE WAITING ROOM ONLY DURING PRE  OP AND PROCEDURE.   IF YOU WILL BE ADMITTED INTO THE HOSPITAL YOU ARE ALLOWED ONE SUPPORT PERSON DURING VISITATION HOURS ONLY  (10AM -8PM)   . The support person may change daily. . The support person must pass our screening, gel in and out, and wear a mask at all times, including in the patient's room. . Patients must also wear a mask when staff or their support person are in the room.   COVID SWAB TESTING MUST BE COMPLETED ON:  Friday, 03-19-20 @ 2:35 PM    [address] [place], [place] [address]  (Must self quarantine after testing. Follow instructions on handout.)        Your procedure is scheduled on:  Tuesday, 03-23-20   Report to [facility] Main  Entrance    Report to admitting at 10:45 M   Call this number if you have problems the morning of surgery [phone]   Do not eat food :After Midnight.   May have liquids until  9:45 am  day of surgery  CLEAR LIQUID DIET  Foods Allowed                                                                     Foods Excluded  Water, Black Coffee and tea, regular and decaf              liquids that you cannot  Plain Jell-O in any flavor  (No red)                                     see through such as: Fruit ices (not with fruit pulp)                                      milk, soups, orange juice              Iced Popsicles (No red)                                      All solid food                                   Apple juices Sports drinks like Gatorade (No red) Lightly seasoned clear broth or consume(fat free) Sugar, honey syrup     Oral Hygiene is also important to reduce your risk of infection.                                    Remember - BRUSH YOUR TEETH THE MORNING OF SURGERY WITH YOUR REGULAR TOOTHPASTE   Do NOT smoke after Midnight   Take these medicines the morning of surgery with A SIP OF WATER:   Entresto, Inspra, Gabapentin, Metoprolol,  Omeprazole, Sertraline  How to Manage Your Diabetes Before and After Surgery  Why is it important to control my blood sugar before and after surgery? . Improving blood sugar levels before and after surgery helps healing and can limit problems. . A way of improving blood sugar control is eating a healthy diet by: o  Eating less sugar and carbohydrates o  Increasing activity/exercise o  Talking with your doctor about reaching your blood sugar goals . High blood sugars (greater than 180 mg/dL) can raise your risk of infections and slow your recovery, so you will need to focus on controlling your diabetes during the weeks before surgery. . Make sure that the doctor who takes care of your diabetes knows about your planned surgery including the date and location.  How do I manage my blood sugar before surgery? . Check your blood sugar at least 4 times a day, starting 2 days before surgery, to make sure that the level is not too high or low. o Check your blood sugar the morning of your surgery when you wake up and every 2 hours until you get to the Short Stay unit. . If your blood sugar is less than 70 mg/dL, you will need to treat for low blood sugar: o Do not take insulin. o Treat a low blood sugar (less than 70 mg/dL) with  cup of clear juice (cranberry or apple), 4 glucose tablets, OR glucose gel. o Recheck blood sugar in 15 minutes after treatment (to make sure it is greater than 70 mg/dL). If your blood sugar is not greater than 70 mg/dL on recheck, call [phone] for further instructions. . Report your blood sugar to the short stay nurse when you get to Short Stay.  . If you are admitted to the hospital after surgery: o Your blood sugar will be checked by the staff and you will probably be given insulin after surgery (instead of oral diabetes medicines) to make sure you have good blood sugar levels. o The goal for blood sugar control after surgery  is 80-180 mg/dL.   WHAT DO I DO ABOUT MY DIABETES MEDICATION?  [name] Do not take oral diabetes medicines (pills) the morning of surgery.  . THE DAY BEFORE SURGERY:  Do not take Jardiance -    Do not take bedtime dose of Insulin Aspart (Novolog) -  ,Take 27 units of Insulin Glargine (Lantus)       . THE MORNING OF SURGERY:  Do not take Jardiance -     Do not take Insulin Aspart (Novolog) -    Take 27 units of Insulin Glargine (Lantus).     . If your CBG is greater than 220 mg/dL, you may take  of your sliding scale  . (correction) dose of insulin.                         You may not have any metal on your body including  jewelry, and body piercings              Do not wear lotions, powders, perfumes/cologne, or deodorant                        Men may shave face and neck.     Do not bring valuables to the hospital. [place].   Contacts, dentures or bridgework may not be worn into surgery.  Patients discharged the day of surgery will not be allowed to drive home.                Please read over the following fact sheets you were given: IF YOU HAVE QUESTIONS ABOUT YOUR PRE OP INSTRUCTIONS PLEASE  CALL [phone]    [place] - Preparing for Surgery Before surgery, you can play an important role.  Because skin is not sterile, your skin needs to be as free of germs as possible.  You can reduce the number of germs on your skin by washing with CHG (chlorahexidine gluconate) soap before surgery.  CHG is an antiseptic cleaner which kills germs and bonds with the skin to continue killing germs even after washing. Please DO NOT use if you have an allergy to CHG or antibacterial soaps.  If your skin becomes reddened/irritated stop using the CHG and inform your nurse when you arrive at Short Stay. Do not shave (including legs and underarms) for at least 48 hours prior to the first CHG shower.  You may shave your face/neck.  Please follow  these instructions carefully:  1.  Shower with CHG Soap the night before surgery and the  morning of surgery.  2.  If you choose to wash your hair, wash your hair first as usual with your normal  shampoo.  3.  After you shampoo, rinse your hair and body thoroughly to remove the shampoo.                             4.  Use CHG as you would any other liquid soap.  You can apply chg directly to the skin and wash.  Gently with a scrungie or clean washcloth.  5.  Apply the CHG Soap to your body ONLY FROM THE NECK DOWN.   Do   not use on face/ open                           Wound or open sores. Avoid contact with eyes, ears mouth and   genitals (private parts).                       Wash face,  Genitals (private parts) with your normal soap.             6.  Wash thoroughly, paying special attention to the area where your    surgery  will be performed.  7.  Thoroughly rinse your body with warm water from the neck down.  8.  DO NOT shower/wash with your normal soap after using and rinsing off the CHG Soap.                9.  Pat yourself dry with a clean towel.            10.  Wear clean pajamas.            11.  Place clean sheets on your bed the night of your first shower and do not  sleep with pets. Day of Surgery : Do not apply any lotions/deodorants the morning of surgery.  Please wear clean clothes to the hospital/surgery center.  FAILURE TO FOLLOW THESE INSTRUCTIONS MAY RESULT IN THE CANCELLATION OF YOUR SURGERY  PATIENT SIGNATURE_________________________________  NURSE SIGNATURE__________________________________  ________________________________________________________________________

## 2020-03-15 NOTE — Progress Notes (Signed)
[facility] DEVICE PROGRAMMING  Patient Information: Name:  Mason [name]  DOB:  02/03/1941  MRN:  [id number]    Planned Procedure: Circumcision  Surgeon: Harold Barban  Date of Procedure: 03/13/2020  Cautery will be used.  Position during surgery: Supine   Please send documentation back to:  [facility] (Fax # [phone])   Lambert Keto, RN  03/15/2020 2:05 PM   Device Information:  Clinic EP Physician:  [name], MD   Device Type:  Defibrillator Manufacturer and Phone #:  Medtronic: [id number] Pacemaker Dependent?:  Yes.   Date of Last Device Check:  01/27/20 (remote) 12/01/19 (in-clinic) Normal Device Function?:  Yes.    Electrophysiologist's Recommendations:   Have magnet available.  Provide continuous ECG monitoring when magnet is used or reprogramming is to be performed.   Procedure should not interfere with device function.  No device programming or magnet placement needed.  Per Device Clinic Standing Orders, York Ram, RN  2:40 PM 03/15/2020

## 2020-03-15 NOTE — Progress Notes (Addendum)
COVID Vaccine Completed: x2 Date COVID Vaccine completed: 08-26-19 &09-16-19 COVID vaccine manufacturer: [employer]   PCP - Ricardo Jericho, MD Cardiologist - [name], MD  Clearance on chart dated 03-16-20 from Kerin Ransom, [place]  Chest x-ray - 04-04-19 in Epic EKG - 12-01-19 in Epic Stress Test - 04-10-18 in Epic ECHO - 01-16-18 in Epic Cardiac Cath - 03-22-18 in Epic  Sleep Study -  CPAP -   Fasting Blood Sugar - 57 to 300 Checks Blood Sugar once or twice a day  Blood Thinner Instructions: Xarelto 20 mg.  Hold 72 hours per note in Epic dated 03-15-20 Aspirin Instructions: Last Dose:  Anesthesia review: AICD, Pacemaker, CHF, TIA, systolic heart failure, L ventricular dysfunction, Afib, complete heart block  Patient denies shortness of breath, fever, cough and chest pain at PAT appointment   Patient verbalized understanding of instructions that were given to them at the PAT appointment. Patient was also instructed that they will need to review over the PAT instructions again at home before surgery.

## 2020-03-15 NOTE — Telephone Encounter (Signed)
Patient with diagnosis of afib on Xarelto for anticoagulation.    Procedure: circumcision Date of procedure: TBD  CHADS2-VASc score of 4 (age x2, CHF, HTN, DM, CAD, TIA)  CrCl 89mL/min Platelet count 157K  Request is to hold Xarelto for 3 days prior to procedure. Pt is at elevated cardiac risk off of anticoagulation. Ideally would prefer to only hold Xarelto for 1 or 2 days max. Will forward to MD for input.

## 2020-03-16 ENCOUNTER — Encounter: Payer: Self-pay | Admitting: Family Medicine

## 2020-03-16 ENCOUNTER — Ambulatory Visit (INDEPENDENT_AMBULATORY_CARE_PROVIDER_SITE_OTHER): Payer: Medicare Other | Admitting: Family Medicine

## 2020-03-16 ENCOUNTER — Other Ambulatory Visit: Payer: Self-pay

## 2020-03-16 VITALS — BP 110/74 | HR 84 | Temp 98.0°F | Resp 16 | Ht 71.0 in | Wt 238.6 lb

## 2020-03-16 DIAGNOSIS — E118 Type 2 diabetes mellitus with unspecified complications: Secondary | ICD-10-CM

## 2020-03-16 DIAGNOSIS — E7800 Pure hypercholesterolemia, unspecified: Secondary | ICD-10-CM

## 2020-03-16 DIAGNOSIS — E1165 Type 2 diabetes mellitus with hyperglycemia: Secondary | ICD-10-CM | POA: Diagnosis not present

## 2020-03-16 DIAGNOSIS — N1831 Chronic kidney disease, stage 3a: Secondary | ICD-10-CM

## 2020-03-16 DIAGNOSIS — IMO0002 Reserved for concepts with insufficient information to code with codable children: Secondary | ICD-10-CM

## 2020-03-16 DIAGNOSIS — I10 Essential (primary) hypertension: Secondary | ICD-10-CM | POA: Diagnosis not present

## 2020-03-16 LAB — COMPREHENSIVE METABOLIC PANEL
ALT: 13 U/L (ref 0–53)
AST: 17 U/L (ref 0–37)
Albumin: 4.3 g/dL (ref 3.5–5.2)
Alkaline Phosphatase: 73 U/L (ref 39–117)
BUN: 26 mg/dL — ABNORMAL HIGH (ref 6–23)
CO2: 30 mEq/L (ref 19–32)
Calcium: 9.7 mg/dL (ref 8.4–10.5)
Chloride: 102 mEq/L (ref 96–112)
Creatinine, Ser: 1.74 mg/dL — ABNORMAL HIGH (ref 0.40–1.50)
GFR: 38.17 mL/min — ABNORMAL LOW (ref >60.00)
Glucose, Bld: 147 mg/dL — ABNORMAL HIGH (ref 70–99)
Potassium: 5.2 mEq/L — ABNORMAL HIGH (ref 3.5–5.1)
Sodium: 140 mEq/L (ref 135–145)
Total Bilirubin: 0.8 mg/dL (ref 0.2–1.2)
Total Protein: 7.4 g/dL (ref 6.0–8.3)

## 2020-03-16 LAB — LIPID PANEL
Cholesterol: 166 mg/dL (ref 0–200)
HDL: 41.5 mg/dL (ref >39.00)
LDL Cholesterol: 85 mg/dL (ref 0–99)
NonHDL: 124.36
Total CHOL/HDL Ratio: 4
Triglycerides: 196 mg/dL — ABNORMAL HIGH (ref 0.0–149.0)
VLDL: 39.2 mg/dL (ref 0.0–40.0)

## 2020-03-16 LAB — HEMOGLOBIN A1C: Hgb A1c MFr Bld: 9.2 % — ABNORMAL HIGH (ref 4.6–6.5)

## 2020-03-16 NOTE — Telephone Encounter (Signed)
   Primary Cardiologist: [name], MD  Chart reviewed and patient contacted by phone today as part of pre-operative protocol coverage. Given past medical history and time since last visit, based on ACC/AHA guidelines, MACLAIN COHRON would be at acceptable risk for the planned procedure without further cardiovascular testing.   Per Dr Sallyanne Kuster - OK to hold Xarelto 48 hours pre op, resume as soon as safe post op.   I will route this recommendation to the requesting party via Epic fax function and remove from pre-op pool.  Please call with questions.  Kerin Ransom, PA-C 03/16/2020, 11:10 AM

## 2020-03-16 NOTE — Progress Notes (Signed)
OFFICE VISIT  03/16/2020   CC:  Chief Complaint  Patient presents with  . Follow-up    RCI, pt is fasting   HPI:    Patient is a 79 y.o. Caucasian male who presents for 4 mo f/u DM 2, HTN, HLD, CRI III. He has nonischemic CM (syst and diast dysfxn) and has CRT-D, also chronic a-fib and complete heart block (pacer dependent),andconsistently has NYHA class II/III sx's. Most recent cardiology f/u 08/2018: all stable, remains on max medical mgmt + CRT-D.  A/P as of last visit: "1) DM 2: fair amount of dietary noncompliance. Glucoses still mostly up. Discussed plan for avoidance of hypoglycemic episodes, appropriate response to hypoglyc, etc. No insulin dosing changes at this time, will await A1c result from today. Urine microalb/cr today.  2) HTN: well controlled and not too low. Lytes/cr today.  3) HLD: tolerating statin. Diet in need of improvement. FLP and hepatic panel today.  4) CRI: fluid intake sounds adequate in the context of CHF. Avoids NSAIDs. Lytes/cr today.   5) Chronic anticoagulation for a-fib: doing fine on xarelto. CBC monitoring today."   INTERIM HX: Feeling well, no acute complaints.  Last visit all labs stable except sugar was 246 and HbA1c was 9.9% (same as last check 6 mo ago). I increased his lantus to 49 Units and mealtime insulin to 14 units each meal and encouraged stricter diab diet. Fasting gluc 130-140 "usually". Still not eating diab diet.  PP glucose still up to 300 a lot of the time. Occ gluc <100 and he does feel some hypoglyc sx's.  Occ use of glucose tabs.  These occur in times of more intense activity and period of dec PO intake.  CRI: hydrating with water well. HTN: no home bp monitoring. HLD: tolerating/compliant with statin.  ROS: no fevers, no CP, no SOB, no wheezing, no cough, no dizziness, no HAs, no rashes, no melena/hematochezia.  No polyuria or polydipsia.  No myalgias or arthralgias.  No focal weakness, paresthesias,  or tremors.  No acute vision or hearing abnormalities. No n/v/d or abd pain.  No palpitations.    Past Medical History:  Diagnosis Date  . AICD (automatic cardioverter/defibrillator) present 2018   MDT CRT-D.  Fatigue-->completely pacer dependent.  Pacer settings adjusted 12/2017 to allow more chronotrophc variance with ADL's//exertion.  . Arthritis    Pt is s/p eft reverse total shoulder arthroplasty.  . Balanitis    chronic fungal  . BPH (benign prostatic hypertrophy) 06/2011   Irritative sx's; pt declined trial of anticholinergic per Urology records  . Chronic combined systolic and diastolic heart failure ([place]) 05/31/2012   Nonischemic:  EF 40-45%, LA mod-severe dilated, AFIB.   02/2016 EF 40%, diffuse hypokinesis, grade 2 DD.  Myoc perf imaging showed EF 32% 04/2016.  Pt upgraded to CRT-D 01/04/17.  [name] Chronic renal insufficiency, stage III (moderate) 2015   CrCl about 60 ml/min  . Complete heart block ([facility])    Has dual chamber pacer.  . Depression   . DOE (dyspnea on exertion)    NYHA class II/III CHF  . Episodic low back pain 01/22/2013   w/intermittent radiculitis (12/2014 his neurologist referred him to pain mgmt for epidural steroid injection)  . Erectile dysfunction 2019   due to zoloft--urol rx'd viagra  . GERD (gastroesophageal reflux disease)   . H/O tilt table evaluation 11/02/05   negative  . Helicobacter pylori gastritis 01/2016  . History of adenomatous polyp of colon 10/12/11   Dr. [name] (3 right  side of colon- tubular adenomas removed)  . History of cardiovascular stress test 05/28/12   no ischemia, EF 37%, imaging results are unchanged and within normal variance  . History of chronic prostatitis   . History of kidney stones   . History of vertigo    + Hx of posterior HA's.  Neuro (Dr. Erling Cruz) eval 2011.  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  . Hyperlipidemia   . Hypertension   . Lumbar  spondylosis    lumbosacral radiculopathy at L4 by EMG testing, right foot drop (neurologist is Dr. Linus Salmons with [facility] in [place])--neurologist referred him to neurosurgery  . Migraine    "used to have them all the time; none for years" (01/04/2017)  . Myocardial infarction [facility]) ?1970s   not entirely certain of this  . Nephrolithiasis 07/2012   Left UVJ 2 mm stone with dilation of renal collecting system and slight hydroureter on right  . Neuropathy   . NICM (nonischemic cardiomyopathy) ([place])    a. 02/2018 Cath: LM nl, LAD min irregs, LCX no, RCA 20d. [id number]. Fick CO/CI 4.4/2.0.  . 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  Since no CAD on cath 05/2016, cards recommends upgrade to CRT-D.  [name] Permanent atrial fibrillation ([place])    DCCV 07/09/13-converted, lasted two days, then back into afib--needs lifetime anticoagulation (Xarelto as of 09/2014)  . Prostate cancer screening 09/2017   done by urol annually (normal prostate exam documented + PSA 0.84 as of 10/01/17 urol f/u.  10/2018 urol f/u PSA 0.6, no prostate nodule.  . Rectus diastasis   . Right ankle sprain 08/2017   w/distal fibula avulsion fx noted on u/s but not plain film-(Dr. Hudnall).  . Skin cancer of arm, left    "burned it off" (01/04/2017)  . TIA (transient ischemic attack)    L face and L arm weakness. Peri procedural->a. 03/22/2018 following cath. CT head neg. No MRI b/c has pacer. Likely due to embolus to distal branch of RMCA  . Type II diabetes mellitus ([place])     Past Surgical History:  Procedure Laterality Date  . ABI's Bilateral 05/21/2018   normal  . BACK SURGERY    . BIV ICD INSERTION CRT-D N/A 01/04/2017   Procedure: BiV ICD ;  Surgeon: Constance Haw, MD;  Location: [facility];  Service: Cardiovascular;  Laterality: N/A;  . CARDIAC CATHETERIZATION N/A 06/14/2016   Minimal nonobstructive dz, EF 25-35%.  Procedure: Left Heart  Cath and Coronary Angiography;  Surgeon: Peter M Martinique, MD;  Location: [facility];  Service: Cardiovascular;  Laterality: N/A;  . CARDIOVASCULAR STRESS TEST  2012   2012 nuclear perfusion study: low risk scan; 04/2016 normal myocardial perfusion imaging, EF 32%.  [name] CARDIOVERSION  07/09/2012   Procedure: CARDIOVERSION;  Surgeon: [name], MD;  Location: [facility];  Service: Cardiovascular;  Laterality: N/A;  . CATARACT EXTRACTION W/ INTRAOCULAR LENS IMPLANT & ANTERIOR VITRECTOMY, BILATERAL Bilateral   . COLONOSCOPY W/ POLYPECTOMY  approx 2006; repeated 09/2011   Polyps on 2013 EGD as well, repeat 12/2014  . ESOPHAGOGASTRODUODENOSCOPY  10/18/06   Done due to chronic GERD: Normal, bx showed no barrett's esophagus (Dr. [name])  . FLEXOR TENDON REPAIR Left 10/02/2016   Procedure: LEFT RING FINGER WOUND EXPORATION AND FLEXOR TENDON REPAIR AND NERVE REPAIR;  Surgeon: Milly Jakob, MD;  Location: [facility];  Service: Orthopedics;  Laterality: Left;  .  INSERT / REPLACE / REMOVE PACEMAKER  02/05/2012   dual chamber, sinus node dysfunction, sinus arrest, PAF, Medtronic Revo serial#-[id number] H: last checked 05/2015  . LUMBAR LAMINECTOMY Left 1976   L4-5  . PACEMAKER REMOVAL  01/04/2017  . PERMANENT PACEMAKER INSERTION N/A 02/05/2012   Procedure: PERMANENT PACEMAKER INSERTION;  Surgeon: [name], MD; Generator Medtronic Revo model [place] serial number [id number] H Laterality: N/A;  . RETINAL DETACHMENT SURGERY Left ~ 1999  . REVERSE SHOULDER ARTHROPLASTY Left 2018   Left shoulder reverse [name] in [place]).  [name] RIGHT/LEFT HEART CATH AND CORONARY ANGIOGRAPHY N/A 03/22/2018   EF 30-35%, no CAD.  Procedure: RIGHT/LEFT HEART CATH AND CORONARY ANGIOGRAPHY;  Surgeon: Jolaine Artist, MD;  Location: [facility];  Service: Cardiovascular;  Laterality: N/A;  . TRANSTHORACIC ECHOCARDIOGRAM  08/25/10; 05/2012; 03/23/16;12/2017   mild asymmetric LVH, normal systolic function, normal  diastolic fxn, mild-to-mod mitral regurg, mild aortic valve sclerosis and trace AI, mild aortic root dilatation. 2014 f/u showed EF 40-45%, mod LAE, A FIB.  02/2016 EF 40%, diffuse hypokinesis, grade 2 DD. 12/2017 EF 35-40%,diffuse hypokin,grd III DD, mild MR    Outpatient Medications Prior to Visit  Medication Sig Dispense Refill  . BD INSULIN SYRINGE U/F 31G X 5/16" 1 ML MISC USE DAILY AS DIRECTED 100 each 3  . COMBIGAN 0.2-0.5 % ophthalmic solution Place 1 drop into both eyes at bedtime.     . Continuous Blood Gluc Receiver (FREESTYLE LIBRE 2 READER) DEVI USE TO CHECK BLOOD SUGAR 1-2 TIMES DAILY. 1 each 3  . Continuous Blood Gluc Sensor (FREESTYLE LIBRE SENSOR SYSTEM) MISC USE TO CHECK BLOOD SUGARS 1-2 TIMES DAILY. 1 each 3  . empagliflozin (JARDIANCE) 10 MG TABS tablet Take 1 tablet (10 mg total) by mouth daily. 15 tablet 0  . ENTRESTO 97-103 MG Take 1 tablet by mouth 2 (two) times daily. 180 tablet 2  . eplerenone (INSPRA) 25 MG tablet TAKE 1 TABLET DAILY 90 tablet 3  . gabapentin (NEURONTIN) 300 MG capsule 2 caps po bid 360 capsule 3  . insulin aspart (NOVOLOG FLEXPEN) 100 UNIT/ML FlexPen 10 U SQ at the time of each meal 18 mL 6  . insulin glargine (LANTUS) 100 UNIT/ML injection 49 Units SQ qd.  Pt will be titrating his dose periodically. 30 mL 6  . Insulin Pen Needle (PEN NEEDLES) 31G X 5 MM MISC USE TO INJECT INSULIN SQ. 100 each 2  . latanoprost (XALATAN) 0.005 % ophthalmic solution Place 1 drop into both eyes at bedtime.   12  . meclizine (ANTIVERT) 25 MG tablet TAKE 1 TABLET THREE TIMES A DAY AS NEEDED FOR DIZZINESS 270 tablet 1  . metoprolol succinate (TOPROL-XL) 25 MG 24 hr tablet Take 1 tablet (25 mg total) by mouth daily. Take with or immediately following a meal. 90 tablet 1  . omeprazole (PRILOSEC) 40 MG capsule TAKE 1 CAPSULE DAILY 90 capsule 1  . oxybutynin (DITROPAN-XL) 5 MG 24 hr tablet TAKE 1 TABLET AT BEDTIME 90 tablet 0  . rOPINIRole (REQUIP) 1 MG tablet TAKE 1 TABLET DAILY  90 tablet 1  . sertraline (ZOLOFT) 100 MG tablet Take 1 tablet (100 mg total) by mouth daily. 90 tablet 1  . simvastatin (ZOCOR) 20 MG tablet TAKE 1 TABLET EVERY EVENING 90 tablet 3  . XARELTO 20 MG TABS tablet TAKE 1 TABLET DAILY WITH SUPPER 90 tablet 3  . glucose blood test strip Use to check blood sugars 1-2 times daily (Patient not taking:  Reported on 03/16/2020) 100 each 3  . Lancets (FREESTYLE) lancets USE AS INSTRUCTED (Patient not taking: Reported on 03/16/2020) 100 each 11  . clotrimazole-betamethasone (LOTRISONE) cream APPLY 1 GRAM TOPICALLY TO THE AFFECTED AREA TWICE DAILY FOR 2 WEEKS (Patient not taking: Reported on 03/16/2020)    . Continuous Blood Gluc Sensor (FREESTYLE LIBRE SENSOR SYSTEM) MISC USE TO CHECK BLOOD SUGARS 1-2 TIMES DAILY. (Patient not taking: Reported on 03/16/2020) 1 each 1  . metoprolol succinate (TOPROL-XL) 50 MG 24 hr tablet  (Patient not taking: Reported on 03/16/2020)    . nystatin-triamcinolone (MYCOLOG II) cream SMARTSIG:Sparingly Topical Twice Daily (Patient not taking: Reported on 03/16/2020)     No facility-administered medications prior to visit.    No Known Allergies  ROS As per HPI  PE: Vitals with BMI 03/16/2020 12/01/2019 11/20/2019  Height 5\' 11"  5\' 11"  5\' 11"   Weight 238 lbs 10 oz 241 lbs 237 lbs 10 oz  BMI 33.29 99.37 16.96  Systolic 789 89 381  Diastolic 74 63 70  Pulse 84 76 90  O2 sat on RA today is 94%  Gen: Alert, well appearing.  Patient is oriented to person, place, time, and situation. AFFECT: pleasant, lucid thought and speech. CV: RRR, no m/r/g.   LUNGS: CTA bilat, nonlabored resps, good aeration in all lung fields. EXT: no clubbing or cyanosis.  no edema.    LABS:  Lab Results  Component Value Date   TSH 2.963 04/04/2018   Lab Results  Component Value Date   WBC 5.5 11/20/2019   HGB 16.3 11/20/2019   HCT 49.8 11/20/2019   MCV 91.2 11/20/2019   PLT 157.0 11/20/2019   Lab Results  Component Value Date   CREATININE  1.63 (H) 11/20/2019   BUN 33 (H) 11/20/2019   NA 138 11/20/2019   K 4.6 11/20/2019   CL 103 11/20/2019   CO2 28 11/20/2019   Lab Results  Component Value Date   ALT 13 11/20/2019   AST 16 11/20/2019   ALKPHOS 90 11/20/2019   BILITOT 0.5 11/20/2019   Lab Results  Component Value Date   CHOL 166 11/20/2019   Lab Results  Component Value Date   HDL 31.60 (L) 11/20/2019   Lab Results  Component Value Date   LDLCALC 73 03/23/2018   Lab Results  Component Value Date   TRIG 280.0 (H) 11/20/2019   Lab Results  Component Value Date   CHOLHDL 5 11/20/2019   Lab Results  Component Value Date   HGBA1C 9.9 (H) 11/20/2019    IMPRESSION AND PLAN:  1) DM 2, poor control. Noncompliant with diab diet. HbA1c and lytes/cr today.  2) HTN: The current medical regimen is effective;  continue present plan and medications. Lytes/cr today.  3) HLD: tolerating statin. FLP and hepatic panel today.  4) CRI III: hydrates well, avoids NSAIDs. Lytes/cr today. CMET, A1c, lipids today  An After Visit Summary was printed and given to the patient.  FOLLOW UP: Return in about 3 months (around 06/16/2020) for routine chronic illness f/u.  Signed:  Crissie Sickles, MD           03/16/2020

## 2020-03-18 ENCOUNTER — Other Ambulatory Visit: Payer: Self-pay

## 2020-03-18 MED ORDER — NOVOLOG FLEXPEN 100 UNIT/ML ~~LOC~~ SOPN: PEN_INJECTOR | SUBCUTANEOUS | 6 refills | Status: DC

## 2020-03-18 MED ORDER — INSULIN GLARGINE 100 UNIT/ML ~~LOC~~ SOLN: SUBCUTANEOUS | 6 refills | Status: DC

## 2020-03-19 ENCOUNTER — Other Ambulatory Visit: Payer: Self-pay

## 2020-03-19 ENCOUNTER — Other Ambulatory Visit (HOSPITAL_COMMUNITY)
Admission: RE | Admit: 2020-03-19 | Discharge: 2020-03-19 | Disposition: A | Discharge status: Home / Self Care | Payer: Medicare Other | Source: Ambulatory Visit | Attending: Urology | Admitting: Urology

## 2020-03-19 ENCOUNTER — Encounter (HOSPITAL_COMMUNITY): Payer: Self-pay

## 2020-03-19 ENCOUNTER — Encounter (HOSPITAL_COMMUNITY)
Admission: RE | Admit: 2020-03-19 | Discharge: 2020-03-19 | Disposition: A | Discharge status: Home / Self Care | Payer: Medicare Other | Source: Ambulatory Visit | Attending: Urology | Admitting: Urology

## 2020-03-19 DIAGNOSIS — Z95810 Presence of automatic (implantable) cardiac defibrillator: Secondary | ICD-10-CM | POA: Insufficient documentation

## 2020-03-19 DIAGNOSIS — Z20822 Contact with and (suspected) exposure to covid-19: Secondary | ICD-10-CM | POA: Insufficient documentation

## 2020-03-19 DIAGNOSIS — E1122 Type 2 diabetes mellitus with diabetic chronic kidney disease: Secondary | ICD-10-CM | POA: Diagnosis not present

## 2020-03-19 DIAGNOSIS — I252 Old myocardial infarction: Secondary | ICD-10-CM | POA: Insufficient documentation

## 2020-03-19 DIAGNOSIS — M4727 Other spondylosis with radiculopathy, lumbosacral region: Secondary | ICD-10-CM | POA: Diagnosis not present

## 2020-03-19 DIAGNOSIS — Z794 Long term (current) use of insulin: Secondary | ICD-10-CM | POA: Diagnosis not present

## 2020-03-19 DIAGNOSIS — Z7901 Long term (current) use of anticoagulants: Secondary | ICD-10-CM | POA: Diagnosis not present

## 2020-03-19 DIAGNOSIS — I5042 Chronic combined systolic (congestive) and diastolic (congestive) heart failure: Secondary | ICD-10-CM | POA: Diagnosis not present

## 2020-03-19 DIAGNOSIS — N471 Phimosis: Secondary | ICD-10-CM | POA: Diagnosis not present

## 2020-03-19 DIAGNOSIS — E785 Hyperlipidemia, unspecified: Secondary | ICD-10-CM | POA: Diagnosis not present

## 2020-03-19 DIAGNOSIS — I428 Other cardiomyopathies: Secondary | ICD-10-CM | POA: Diagnosis not present

## 2020-03-19 DIAGNOSIS — K219 Gastro-esophageal reflux disease without esophagitis: Secondary | ICD-10-CM | POA: Diagnosis not present

## 2020-03-19 DIAGNOSIS — N400 Enlarged prostate without lower urinary tract symptoms: Secondary | ICD-10-CM | POA: Insufficient documentation

## 2020-03-19 DIAGNOSIS — Z01812 Encounter for preprocedural laboratory examination: Secondary | ICD-10-CM | POA: Diagnosis not present

## 2020-03-19 DIAGNOSIS — Z79899 Other long term (current) drug therapy: Secondary | ICD-10-CM | POA: Insufficient documentation

## 2020-03-19 DIAGNOSIS — I4891 Unspecified atrial fibrillation: Secondary | ICD-10-CM | POA: Diagnosis not present

## 2020-03-19 DIAGNOSIS — I69954 Hemiplegia and hemiparesis following unspecified cerebrovascular disease affecting left non-dominant side: Secondary | ICD-10-CM | POA: Diagnosis not present

## 2020-03-19 DIAGNOSIS — N183 Chronic kidney disease, stage 3 unspecified: Secondary | ICD-10-CM | POA: Insufficient documentation

## 2020-03-19 DIAGNOSIS — I130 Hypertensive heart and chronic kidney disease with heart failure and stage 1 through stage 4 chronic kidney disease, or unspecified chronic kidney disease: Secondary | ICD-10-CM | POA: Diagnosis not present

## 2020-03-19 DIAGNOSIS — E1165 Type 2 diabetes mellitus with hyperglycemia: Secondary | ICD-10-CM | POA: Diagnosis not present

## 2020-03-19 LAB — CBC
HCT: 51.2 % (ref 39.0–52.0)
Hemoglobin: 16.6 g/dL (ref 13.0–17.0)
MCH: 30.3 pg (ref 26.0–34.0)
MCHC: 32.4 g/dL (ref 30.0–36.0)
MCV: 93.6 fL (ref 80.0–100.0)
Platelets: 144 10*3/uL — ABNORMAL LOW (ref 150–400)
RBC: 5.47 MIL/uL (ref 4.22–5.81)
RDW: 13.4 % (ref 11.5–15.5)
WBC: 4.9 10*3/uL (ref 4.0–10.5)
nRBC: 0 % (ref 0.0–0.2)

## 2020-03-19 LAB — GLUCOSE, CAPILLARY: Glucose-Capillary: 293 mg/dL — ABNORMAL HIGH (ref 70–99)

## 2020-03-19 LAB — SARS CORONAVIRUS 2 (TAT 6-24 HRS): SARS Coronavirus 2: NEGATIVE

## 2020-03-22 ENCOUNTER — Other Ambulatory Visit: Payer: Self-pay | Admitting: Family Medicine

## 2020-03-22 NOTE — H&P (Signed)
Mason [name] is a 79 year-old male established patient who is here for urinary symptoms.   LUTS: In 12/13 he reported nocturia 3-4 times with some urgency but no urge incontinence. It was not significant enough for him at that time to warrant medical management.   Interval history 09/07/16: When I saw him last in 1/18 he had reported an increase in nocturia as well as some urgency but no urge incontinence. He had tried samples of Rapaflo but did not find this helpful. I therefore gave him samples of Myrbetriq 25 mg. he said that he did not find that this was particularly helpful as far as he can recall either. He continues to have frequency, urgency and nocturia 2-3 times.   10/01/17: He continues to have, as his greatest complaint, urinary urgency and he said if he can't get to the bathroom in time he has the possibility of leaking some urine. He has tried Myrbetriq at 25 and 50 mg as well as Toviaz 8 mg and has failed each of these.   12/17/19: He reports to me today that his greatest complaint is that of an inability to retract his foreskin. This has been a problem for him in the past and responded to topical clotrimazole/betamethasone. He said this recurred about a week ago. He does have diabetes and he said his sugars have not been under good control. He tried some medication 1st with some cortisone and then some Neosporin but he continues to have difficulty retracting his foreskin and has developed itching beneath the foreskin as well. No discharge. It has become red though.   01/23/2020: Treated for recurrent balanitis and phimosis with clotrimazole/betamethasone cream at time of last office visit.   He initially noted improvement with decreased irritation and discomfort to the glans as well as increasing ability to retract his foreskin. However over the past week he has had worsening/recurrence of previously noted symptoms. He continues to use previously prescribed topical therapy twice daily as  directed. Symptoms currently associated with some mild increased frequency/urgency from baseline, some mild burning and painful urination but not significant by his report. He has not had any significant worsening of force of stream from baseline. He denies any gross hematuria, fevers or chills, nausea/vomiting.   He gets up at night to urinate 3 times. He does have urgency.  -03/08/20-patient with history of penile phimosis as above. He is barely able to retract the foreskin and has not seemed to have responded to antibiotic/steroid creams. Last time he is able to retract the foreskin was earlier today but he states it was very painful to retract. Patient is on chronic Xarelto therapy for heart condition. 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 He is here for discussion regarding possible circumcision.     ALLERGIES: No Allergies    MEDICATIONS: Metoprolol Succinate  Omeprazole 40 mg capsule,delayed release  Oxybutynin Chloride 5 mg tablet  Combigan 0.2 %-0.5 % drops  Entresto  Eplerenone  Freestyle Lancets  Gabapentin  Jardiance 10 mg tablet  Latanoprost  Losartan Potassium 100 MG Oral Tablet Oral  Meclizine Hcl 25 mg tablet  Novolog Flexpen  Nystatin-Triamcinolone 100,000 unit/gram-0.1 % cream Apply thin layer to affected area twice daily.  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 This should respond to topical therapy. - 12/17/2019, (Chronic), He continues to have difficulty with what appears to be a fungal balanitis that does not see responding clotrimazole. I therefore will use fluconazole., - 10/23/2018, He does appear to have  a mild balanitis. It appears to possibly be fungal. I will prescribe an anti fungal for this. I will reassess him when he returns for his scheduled follow-up next month., - 2020 Encounter for Prostate Cancer screening (Stable), His PSA is normal and he had no worrisome findings on DRE. He will return again in a year for screening DRE and PSA. - 12/17/2019, (Stable), His prostate was noted to be benign to examination. I will obtain a screening PSA today and continue to see him on a yearly basis for DRE and PSA., - 2018, Prostate cancer screening, - 2017 Incomplete bladder emptying, I note he does have a slight elevation of his PVR but really is not having a lot of voiding symptoms right now. - 12/17/2019 BPH w/o LUTS (Stable), He does have some BPH by exam with no new voiding symptoms - 10/23/2018 Phimosis, He is a diabetic who has developed some phimosis but I think the phimosis is most likely secondary to a balanitis and we discussed conservative therapy with topical steroid preparation in conjunction with an anti fungal. I also discussed circumcision but wanted to proceed conservatively 1st. - 2020 ED due to arterial insufficiency, He has had difficulty with erectile dysfunction and wanted to discuss treatment. I told him that the reason he was having difficulty achieving orgasm was because of the Zoloft he was taking. I have discussed with the patient the fact that sildenafil at a 20 mg dose used for the treatment of erectile dysfunction is considered an off label use of this medication by the FDA. We did discuss the fact that Viagra is sildenafil. He understands that being used on label does not mean that the drug is unsafe or that it will not work but rather it is the same medication as Viagra at a different dosage. We discussed the common risks associated with sildenafil including allergic reaction such as itching, hives, swelling of the hands or face, swelling of the mouth or throat, chest tightness and  trouble breathing. In addition we discussed the rare risk of priapism. He does understand lightheadedness, dizziness or fainting could occur as well as sudden changes in vision such as a blue tint, decreased hearing and headache in addition to others. He understands that he can receive a prescription for branded Viagra, a prescription for generic sildenafil to be filled at his local pharmacy, that he can purchase sildenafil from our office and that there are other treatment options available for his erectile dysfunction as well as electing not to be treated for this condition at all. Understanding that he has elected to proceed with the use of sildenafil 20 mg and the medication was dispensed. - 2018, Erectile dysfunction due to arterial insufficiency, - 2014 BPH w/LUTS, Benign prostatic hyperplasia with urinary obstruction - 2017 Overactive bladder, Overactive bladder - 2017 Urinary Urgency, Urinary urgency - 2017 Personal Hx Oth male genital organs diseases, History of chronic prostatitis - 2014 Prostate Stones, Calculus of prostate - 2014      PMH Notes: History of prostatitis: He had difficulty with this in  the past but it is not been a problem for some time now.   History of nephrolithiasis: He had a ureteral stone that he passed in 1/14. A CT scan done at that time revealed no renal calculi     NON-GU PMH: Encounter for general adult medical examination without abnormal findings, Encounter for preventive health examination - November 12, 2012 Personal history of other endocrine, nutritional and metabolic disease, History of hypercholesterolemia - 11-12-12, History of diabetes mellitus, - 11-12-2012 Personal history of other mental and behavioral disorders, History of depression - Nov 12, 2012 Personal history of other specified conditions, History of heartburn - Nov 12, 2012    FAMILY HISTORY: Death In The Family Father - Father Death In The Family Mother - Father Family Health Status Number - Father, Runs In  Family Hypertension - Mother Stroke Syndrome - Mother, Father   SOCIAL HISTORY: Marital Status: Married Preferred Language: English; Ethnicity: Not Hispanic Or Latino; Race: White Current Smoking Status: Patient has never smoked.   Tobacco Use Assessment Completed: Used Tobacco in last 30 days?     Notes: Never A Smoker, Marital History - Currently Married, Occupation:, Tobacco Use, Alcohol Use, Caffeine Use   REVIEW OF SYSTEMS:    GU Review Male:   Patient reports frequent urination, hard to postpone urination, burning/ pain with urination, get up at night to urinate, leakage of urine, and penile pain. Patient denies stream starts and stops, trouble starting your stream, have to strain to urinate , and erection problems.  Gastrointestinal (Upper):   Patient denies nausea, vomiting, and indigestion/ heartburn.  Gastrointestinal (Lower):   Patient denies diarrhea and constipation.  Constitutional:   Patient denies fever, night sweats, weight loss, and fatigue.  Skin:   Patient denies skin rash/ lesion and itching.  Eyes:   Patient denies blurred vision and double vision.  Ears/ Nose/ Throat:   Patient denies sore throat and sinus problems.  Hematologic/Lymphatic:   Patient denies swollen glands and easy bruising.  Cardiovascular:   Patient denies chest pains and leg swelling.  Respiratory:   Patient denies cough and shortness of breath.  Endocrine:   Patient denies excessive thirst.  Musculoskeletal:   Patient denies back pain and joint pain.  Neurological:   Patient denies headaches and dizziness.  Psychologic:   Patient denies depression and anxiety.   Notes: phimosis    VITAL SIGNS:      03/08/2020 01:10 PM  Weight 230 lb / 104.33 kg  Height 71 in / 180.34 cm  BP 113/80 mmHg  Pulse 90 /min  Temperature 98.4 F / 36.8 C  BMI 32.1 kg/m   GU PHYSICAL EXAMINATION:    Anus and Perineum: No hemorrhoids. No anal stenosis. No rectal fissure, no anal fissure. No edema, no dimple, no  perineal tenderness, no anal tenderness.  Scrotum: No lesions. No edema. No cysts. No warts.  Epididymides: Right: no spermatocele, no masses, no cysts, no tenderness, no induration, no enlargement. Left: no spermatocele, no masses, no cysts, no tenderness, no induration, no enlargement.  Testes: No tenderness, no swelling, no enlargement left testes. No tenderness, no swelling, no enlargement right testes. Normal location left testes. Normal location right testes. No mass, no cyst, no varicocele, no hydrocele left testes. No mass, no cyst, no varicocele, no hydrocele right testes.  Urethral Meatus: Normal size. No lesion, no wart, no discharge, no polyp. Normal location.  Penis: Patient is uncircumcised. Foreskin is tight and will not retract easily. I am able to visualize the urethral meatus. I palpate no  glanular lesions through the overlying foreskin. He  Prostate: 40 gram or 2+ size. Left lobe normal consistency, right lobe normal consistency. Symmetrical lobes. No prostate nodule. Left lobe no tenderness, right lobe no tenderness.  Seminal Vesicles: Nonpalpable.  Sphincter Tone: Normal sphincter. No rectal tenderness. No rectal mass.    MULTI-SYSTEM PHYSICAL EXAMINATION:    Constitutional: Well-nourished. No physical deformities. Normally developed. Good grooming.  Neck: Neck symmetrical, not swollen. Normal tracheal position.  Respiratory: No labored breathing, no use of accessory muscles.   Cardiovascular: Normal temperature, normal extremity pulses, no swelling, no varicosities.  Lymphatic: No enlargement of neck, axillae, groin.  Skin: No paleness, no jaundice, no cyanosis. No lesion, no ulcer, no rash.  Neurologic / Psychiatric: Oriented to time, oriented to place, oriented to person. No depression, no anxiety, no agitation.  Gastrointestinal: No mass, no tenderness, no rigidity, non obese abdomen.  Eyes: Normal conjunctivae. Normal eyelids.  Ears, Nose, Mouth, and Throat: Left ear no  scars, no lesions, no masses. Right ear no scars, no lesions, no masses. Nose no scars, no lesions, no masses. Normal hearing. Normal lips.  Musculoskeletal: Normal gait and station of head and neck.     Complexity of Data:  Source Of History:  Patient  Records Review:   Previous Doctor Records, Previous Hospital Records, Previous Patient Records   12/09/19 09/30/18 09/20/17 09/07/16 07/17/15 07/08/14 07/04/13 07/03/12  PSA  Total PSA 0.75 ng/mL 0.60 ng/mL 0.84 ng/mL 0.64 ng/dl 0.65  0.59  0.60  0.56     PROCEDURES:          Urinalysis w/Scope Dipstick Dipstick Cont'd Micro  Color: Yellow Bilirubin: Neg mg/dL WBC/hpf: 0 - 5/hpf  Appearance: Clear Ketones: Neg mg/dL RBC/hpf: 3 - 10/hpf  Specific Gravity: 1.025 Blood: 1+ ery/uL Bacteria: NS (Not Seen)  pH: <=5.0 Protein: Neg mg/dL Cystals: NS (Not Seen)  Glucose: 2+ mg/dL Urobilinogen: 0.2 mg/dL Casts: NS (Not Seen)    Nitrites: Neg Trichomonas: Not Present    Leukocyte Esterase: 1+ leu/uL Mucous: Not Present      Epithelial Cells: 0 - 5/hpf      Yeast: NS (Not Seen)      Sperm: Not Present    ASSESSMENT:      ICD-10 Details  1 GU:   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 Patient is not responding to topical creams and therefore circumcision is indicated. We discussed risks and benefits the procedure as outlined below. Patient will need preoperative clearance for cardiology has he is on chronic Xarelto therapy will need to be discontinued prior to the procedure.  Circumcision consent: I have discussed with the patient the risks and benefits of the procedure including but not limited to bleeding, infection, damage to adjacent structures including the urethra wth possible need for further procedures. Risk of numbness and decreased sensation of the  penis, scarring of the penile skin and pain associated with scarring. I have also discussed with the patient the alternatives to circumcision. Patient voices understanding of the risks and benefits of the above procedure and consents.

## 2020-03-22 NOTE — Anesthesia Preprocedure Evaluation (Addendum)
Anesthesia Evaluation  Patient identified by MRN, date of birth, ID band Patient awake    Reviewed: Allergy & Precautions, NPO status , Patient's Chart, lab work & pertinent test results, reviewed documented beta blocker date and time   Airway Mallampati: II  TM Distance: >3 FB Neck ROM: Full    Dental no notable dental hx. (+) Teeth Intact, Dental Advisory Given   Pulmonary    Pulmonary exam normal breath sounds clear to auscultation       Cardiovascular hypertension, Pt. on medications and Pt. on home beta blockers + CAD, + Past MI, +CHF (LVEF 99-35%, grade 3 diastolic dysfunction) and + DOE  Normal cardiovascular exam+ dysrhythmias (xarelto- LD) Atrial Fibrillation + pacemaker ([facility]) + Cardiac Defibrillator + Valvular Problems/Murmurs (mild MR) MR  Rhythm:Regular Rate:Normal  Echo 12/2017: - Left ventricle: The cavity size was normal. Wall thickness was  increased in a pattern of mild LVH. Systolic function was  moderately reduced. The estimated ejection fraction was in the  range of 35% to 40%. Diffuse hypokinesis. Doppler parameters are  consistent with a reversible restrictive pattern, indicative of  decreased left ventricular diastolic compliance and/or increased  left atrial pressure (grade 3 diastolic dysfunction).  - Aorta: Aortic root dimension: 39 mm (ED).  - Ascending aorta: The ascending aorta was mildly dilated.  - Mitral valve: There was mild regurgitation.  - Left atrium: The atrium was moderately dilated. Volume/bsa, ES  (1-plane Simpson&'s, A4C): 40 ml/m^2.  - Right ventricle: Pacer wire or catheter noted in right ventricle.  - Right atrium: The atrium was mildly dilated. Pacer wire or  catheter noted in right atrium.  - Tricuspid valve: There was mild regurgitation.   Cath 2019: 1. Minimal CAD 2. 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 Well-compensated filling pressures with moderately reduced CO     Neuro/Psych  Headaches, PSYCHIATRIC DISORDERS Depression TIA   GI/Hepatic Neg liver ROS, GERD  Medicated,  Endo/Other  diabetes, Poorly Controlled, Type 2, Insulin DependentObesity BMI 33 a1c 9.2, FS 147 this AM  Renal/GU Renal Insufficiency and CRFRenal diseaseCKD 3, Cr 1.74   Phimosis     Musculoskeletal  (+) Arthritis , Osteoarthritis,  Chronic LBP, lumbar spondylosis    Abdominal (+) + obese,   Peds  Hematology negative hematology ROS (+)   Anesthesia Other Findings   Reproductive/Obstetrics negative OB ROS                            Anesthesia Physical Anesthesia Plan  ASA: IV  Anesthesia Plan: General   Post-op Pain Management:    Induction: Intravenous  PONV Risk Score and Plan: 2 and Ondansetron, Dexamethasone and Treatment may vary due to age or medical condition  Airway Management Planned: LMA  Additional Equipment: None  Intra-op Plan:   Post-operative Plan: Extubation in OR  Informed Consent: I have reviewed the patients History and Physical, chart, labs and discussed the procedure including the risks, benefits and alternatives for the proposed anesthesia with the patient or authorized representative who has indicated his/her understanding and acceptance.     Dental advisory given  Plan Discussed with: CRNA  Anesthesia Plan Comments:        Anesthesia Quick Evaluation

## 2020-03-22 NOTE — Progress Notes (Signed)
Anesthesia Chart Review   Case: [id number] Date/Time: 03/23/20 1230   Procedure: CIRCUMCISION ADULT (N/A )   Anesthesia type: General   Pre-op diagnosis: PHIMOSIS   Location: [facility] ROOM 08 / [facility]   Surgeons: Remi Haggard, MD      DISCUSSION:79 y.o. 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 Harold Barban.    Per cardiology preoperative risk assessment 03/16/2020, "Chart reviewed and patient contacted by phone today as part of pre-operative protocol coverage. Given past medical history and time since last visit, based on ACC/AHA guidelines, ANGELOS WASCO would be at acceptable risk for the planned procedure without further cardiovascular testing.  Per Dr Sallyanne Kuster - OK to hold Xarelto 48 hours pre op, resume as soon as safe post op."  Anticipate pt can proceed with planned procedure barring acute status change.   VS: BP 122/75   Pulse 73   Temp 36.7 C (Oral)   Resp 16   Ht 5\' 11"  (1.803 m)   Wt 108 kg   SpO2 96%   BMI 33.19 kg/m   PROVIDERS: McGowen, Adrian Blackwater, MD is PCP   Croitoru, Dani Gobble, MD is Cardiologist  LABS: Labs reviewed: Acceptable for surgery. (all labs ordered are listed, but only abnormal results are displayed)  Labs Reviewed  CBC - Abnormal; Notable for the following components:      Result Value   Platelets 144 (*)    All other components within normal limits  GLUCOSE, CAPILLARY - Abnormal; Notable for the following components:   Glucose-Capillary 293 (*)    All other components within normal limits     IMAGES:   EKG: 12/01/2019 Rate 72 bpm  Ventricular paced rhythm   CV: Echo 03/22/2018 Assessment/Plan: 1. Minimal CAD 2. Non-ischemic CM  EF 30-35% 3. Well-compensated filling pressures with moderately reduced CO 4. Complicated radial access bilaterally as described above. Suggest femoral approach for any further catheterizations  Echo 01/16/2018 Study Conclusions   - Left ventricle: The cavity size was normal. Wall thickness was  increased in a pattern of mild LVH. Systolic function was  moderately reduced. The estimated ejection fraction was in the  range of 35% to 40%. Diffuse hypokinesis. Doppler parameters are  consistent with a reversible restrictive pattern, indicative of  decreased left ventricular diastolic compliance and/or increased  left atrial pressure (grade 3 diastolic dysfunction).  - Aorta: Aortic root dimension: 39 mm (ED).  - Ascending aorta: The ascending aorta was mildly dilated.  - Mitral valve: There was mild regurgitation.  - Left atrium: The atrium was moderately dilated. Volume/bsa, ES  (1-plane Simpson&'s, A4C): 40 ml/m^2.  - Right ventricle: Pacer wire or catheter noted in right ventricle.  - Right atrium: The atrium was mildly dilated. Pacer wire or  catheter noted in right atrium.  - Tricuspid valve: There was mild regurgitation.   Impressions:   - Compared to the prior study, there has been no significant  interval change. Past Medical History:  Diagnosis Date  . AICD (automatic cardioverter/defibrillator) present 2018   MDT CRT-D.  Fatigue-->completely pacer dependent.  Pacer settings adjusted 12/2017 to allow more chronotrophc variance with ADL's//exertion.  . Arthritis    Pt is s/p eft reverse total shoulder arthroplasty.  [name]  Balanitis    chronic fungal  . BPH (benign prostatic hypertrophy) 06/2011   Irritative sx's; pt declined trial of anticholinergic per Urology records  . Chronic combined systolic and diastolic heart failure ([place]) 05/31/2012   Nonischemic:  EF 40-45%, LA mod-severe dilated, AFIB.   02/2016 EF 40%, diffuse hypokinesis, grade 2 DD.  Myoc perf  imaging showed EF 32% 04/2016.  Pt upgraded to CRT-D 01/04/17.  [name] Chronic renal insufficiency, stage III (moderate) 2015   CrCl about 60 ml/min  . Complete heart block (HCC)    Has dual chamber pacer.  . Depression   . DOE (dyspnea on exertion)    NYHA class II/III CHF  . Episodic low back pain 01/22/2013   w/intermittent radiculitis (12/2014 his neurologist referred him to pain mgmt for epidural steroid injection)  . Erectile dysfunction 2019   due to zoloft--urol rx'd viagra  . GERD (gastroesophageal reflux disease)   . H/O tilt table evaluation 11/02/05   negative  . Helicobacter pylori gastritis 01/2016  . History of adenomatous polyp of colon 10/12/11   Dr. [name] (3 right side of colon- tubular adenomas removed)  . History of cardiovascular stress test 05/28/12   no ischemia, EF 37%, imaging results are unchanged and within normal variance  . History of chronic prostatitis   . History of kidney stones   . History of vertigo    + Hx of posterior HA's.  Neuro (Dr. Erling Cruz) eval 2011.  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  . Hyperlipidemia   . Hypertension   . Lumbar spondylosis    lumbosacral radiculopathy at L4 by EMG testing, right foot drop (neurologist is Dr. Linus Salmons with [facility] in [place])--neurologist referred him to neurosurgery  . Migraine    "used to have them all the time; none for years" (01/04/2017)  . Myocardial infarction [facility]) ?1970s   not entirely certain of this  . Nephrolithiasis 07/2012   Left UVJ 2 mm stone with dilation of renal collecting system and slight hydroureter on right  . Neuropathy   . NICM (nonischemic cardiomyopathy) ([place])    a. 02/2018 Cath: LM nl, LAD min irregs, LCX no, RCA 20d. [id number]. Fick CO/CI 4.4/2.0.  . 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  Since no CAD on cath 05/2016, cards recommends  upgrade to CRT-D.  [name] Permanent atrial fibrillation ([place])    DCCV 07/09/13-converted, lasted two days, then back into afib--needs lifetime anticoagulation (Xarelto as of 09/2014)  . Prostate cancer screening 09/2017   done by urol annually (normal prostate exam documented + PSA 0.84 as of 10/01/17 urol f/u.  10/2018 urol f/u PSA 0.6, no prostate nodule.  . Rectus diastasis   . Right ankle sprain 08/2017   w/distal fibula avulsion fx noted on u/s but not plain film-(Dr. Hudnall).  . Skin cancer of arm, left    "burned it off" (01/04/2017)  . TIA (transient ischemic attack)    L face and L arm weakness. Peri procedural->a. 03/22/2018 following cath. CT head neg. No MRI b/c has pacer. Likely due to embolus to distal branch of RMCA  . Type II diabetes mellitus ([place])     Past Surgical History:  Procedure Laterality Date  . ABI's Bilateral 05/21/2018   normal  . BACK SURGERY    . BIV ICD INSERTION CRT-D N/A 01/04/2017   Procedure: BiV ICD ;  Surgeon: Constance Haw, MD;  Location: [place] [facility];  Service: Cardiovascular;  Laterality: N/A;  . CARDIAC CATHETERIZATION N/A 06/14/2016   Minimal nonobstructive dz, EF 25-35%.  Procedure: Left Heart Cath and Coronary Angiography;  Surgeon: Peter M Martinique, MD;  Location: [place] [facility] LAB;  Service: Cardiovascular;  Laterality: N/A;  . CARDIOVASCULAR STRESS TEST  2012   2012 nuclear perfusion study: low risk scan; 04/2016 normal myocardial perfusion imaging, EF 32%.  [name] CARDIOVERSION  07/09/2012   Procedure: CARDIOVERSION;  Surgeon: [name], MD;  Location: [place];  Service: Cardiovascular;  Laterality: N/A;  . CATARACT EXTRACTION W/ INTRAOCULAR LENS IMPLANT & ANTERIOR VITRECTOMY, BILATERAL Bilateral   . COLONOSCOPY W/ POLYPECTOMY  approx 2006; repeated 09/2011   Polyps on 2013 EGD as well, repeat 12/2014  . ESOPHAGOGASTRODUODENOSCOPY  10/18/06   Done due to chronic GERD: Normal, bx showed no barrett's esophagus (Dr. [name])  . FLEXOR  TENDON REPAIR Left 10/02/2016   Procedure: LEFT RING FINGER WOUND EXPORATION AND FLEXOR TENDON REPAIR AND NERVE REPAIR;  Surgeon: Milly Jakob, MD;  Location: [place];  Service: Orthopedics;  Laterality: Left;  . INSERT / REPLACE / REMOVE PACEMAKER  02/05/2012   dual chamber, sinus node dysfunction, sinus arrest, PAF, Medtronic Revo serial#-[id number] H: last checked 05/2015  . LUMBAR LAMINECTOMY Left 1976   L4-5  . PACEMAKER REMOVAL  01/04/2017  . PERMANENT PACEMAKER INSERTION N/A 02/05/2012   Procedure: PERMANENT PACEMAKER INSERTION;  Surgeon: [name], MD; Generator Medtronic Revo model [place] serial number [id number] H Laterality: N/A;  . RETINAL DETACHMENT SURGERY Left ~ 1999  . REVERSE SHOULDER ARTHROPLASTY Left 2018   Left shoulder reverse [name] in [place]).  [name] RIGHT/LEFT HEART CATH AND CORONARY ANGIOGRAPHY N/A 03/22/2018   EF 30-35%, no CAD.  Procedure: RIGHT/LEFT HEART CATH AND CORONARY ANGIOGRAPHY;  Surgeon: Jolaine Artist, MD;  Location: [place] [facility];  Service: Cardiovascular;  Laterality: N/A;  . TRANSTHORACIC ECHOCARDIOGRAM  08/25/10; 05/2012; 03/23/16;12/2017   mild asymmetric LVH, normal systolic function, normal diastolic fxn, mild-to-mod mitral regurg, mild aortic valve sclerosis and trace AI, mild aortic root dilatation. 2014 f/u showed EF 40-45%, mod LAE, A FIB.  02/2016 EF 40%, diffuse hypokinesis, grade 2 DD. 12/2017 EF 35-40%,diffuse hypokin,grd III DD, mild MR  . WISDOM TOOTH EXTRACTION      MEDICATIONS: . acetaminophen (TYLENOL) 325 MG tablet  . BD INSULIN SYRINGE U/F 31G X 5/16" 1 ML MISC  . COMBIGAN 0.2-0.5 % ophthalmic solution  . Continuous Blood Gluc Receiver (FREESTYLE LIBRE 2 READER) DEVI  . Continuous Blood Gluc Sensor ([place]) MISC  . ENTRESTO 97-103 MG  . eplerenone (INSPRA) 25 MG tablet  . gabapentin (NEURONTIN) 300 MG capsule  . glucose blood test strip  . insulin aspart (NOVOLOG FLEXPEN) 100 UNIT/ML FlexPen   . insulin glargine (LANTUS) 100 UNIT/ML injection  . Insulin Pen Needle (PEN NEEDLES) 31G X 5 MM MISC  . JARDIANCE 10 MG TABS tablet  . Lancets (FREESTYLE) lancets  . latanoprost (XALATAN) 0.005 % ophthalmic solution  . meclizine (ANTIVERT) 25 MG tablet  . metoprolol succinate (TOPROL-XL) 25 MG 24 hr tablet  . omeprazole (PRILOSEC) 40 MG capsule  . oxybutynin (DITROPAN-XL) 5 MG 24 hr tablet  . rOPINIRole (REQUIP) 1 MG tablet  . sertraline (ZOLOFT) 100 MG tablet  . simvastatin (ZOCOR) 20 MG tablet  . XARELTO 20 MG TABS tablet   No current facility-administered medications for this encounter.    Konrad Felix, PA-C [facility]Surgical Testing [phone]

## 2020-03-22 NOTE — Telephone Encounter (Signed)
RF request for jardiance 10mg .  Unsure if patient is to be taking Jardiance since last RF was dated 01/06/20 # 15?  Please advise if patient is to continue jardiance 10mg ? Thanks!

## 2020-03-23 ENCOUNTER — Encounter (HOSPITAL_COMMUNITY): Payer: Self-pay | Admitting: Urology

## 2020-03-23 ENCOUNTER — Ambulatory Visit (HOSPITAL_COMMUNITY): Payer: Medicare Other | Admitting: Anesthesiology

## 2020-03-23 ENCOUNTER — Encounter (HOSPITAL_COMMUNITY)
Admission: RE | Disposition: A | Discharge status: Home / Self Care | Payer: Self-pay | Source: Home / Self Care | Attending: Urology

## 2020-03-23 ENCOUNTER — Ambulatory Visit (HOSPITAL_COMMUNITY)
Admission: RE | Admit: 2020-03-23 | Discharge: 2020-03-23 | Disposition: A | Discharge status: Home / Self Care | Payer: Medicare Other | Attending: Urology | Admitting: Urology

## 2020-03-23 DIAGNOSIS — I509 Heart failure, unspecified: Secondary | ICD-10-CM | POA: Insufficient documentation

## 2020-03-23 DIAGNOSIS — Z950 Presence of cardiac pacemaker: Secondary | ICD-10-CM | POA: Insufficient documentation

## 2020-03-23 DIAGNOSIS — I110 Hypertensive heart disease with heart failure: Secondary | ICD-10-CM | POA: Diagnosis not present

## 2020-03-23 DIAGNOSIS — F329 Major depressive disorder, single episode, unspecified: Secondary | ICD-10-CM | POA: Insufficient documentation

## 2020-03-23 DIAGNOSIS — E1165 Type 2 diabetes mellitus with hyperglycemia: Secondary | ICD-10-CM | POA: Diagnosis not present

## 2020-03-23 DIAGNOSIS — K219 Gastro-esophageal reflux disease without esophagitis: Secondary | ICD-10-CM | POA: Diagnosis not present

## 2020-03-23 DIAGNOSIS — I4891 Unspecified atrial fibrillation: Secondary | ICD-10-CM | POA: Diagnosis not present

## 2020-03-23 DIAGNOSIS — Z7901 Long term (current) use of anticoagulants: Secondary | ICD-10-CM | POA: Insufficient documentation

## 2020-03-23 DIAGNOSIS — E119 Type 2 diabetes mellitus without complications: Secondary | ICD-10-CM | POA: Insufficient documentation

## 2020-03-23 DIAGNOSIS — N471 Phimosis: Secondary | ICD-10-CM | POA: Diagnosis not present

## 2020-03-23 DIAGNOSIS — N481 Balanitis: Secondary | ICD-10-CM | POA: Diagnosis not present

## 2020-03-23 DIAGNOSIS — Z794 Long term (current) use of insulin: Secondary | ICD-10-CM | POA: Insufficient documentation

## 2020-03-23 DIAGNOSIS — Z79899 Other long term (current) drug therapy: Secondary | ICD-10-CM | POA: Insufficient documentation

## 2020-03-23 DIAGNOSIS — I252 Old myocardial infarction: Secondary | ICD-10-CM | POA: Insufficient documentation

## 2020-03-23 DIAGNOSIS — Z6833 Body mass index (BMI) 33.0-33.9, adult: Secondary | ICD-10-CM | POA: Diagnosis not present

## 2020-03-23 DIAGNOSIS — E669 Obesity, unspecified: Secondary | ICD-10-CM | POA: Diagnosis not present

## 2020-03-23 DIAGNOSIS — N3281 Overactive bladder: Secondary | ICD-10-CM | POA: Diagnosis not present

## 2020-03-23 DIAGNOSIS — E7800 Pure hypercholesterolemia, unspecified: Secondary | ICD-10-CM | POA: Insufficient documentation

## 2020-03-23 DIAGNOSIS — I130 Hypertensive heart and chronic kidney disease with heart failure and stage 1 through stage 4 chronic kidney disease, or unspecified chronic kidney disease: Secondary | ICD-10-CM | POA: Diagnosis not present

## 2020-03-23 DIAGNOSIS — I5042 Chronic combined systolic (congestive) and diastolic (congestive) heart failure: Secondary | ICD-10-CM | POA: Diagnosis not present

## 2020-03-23 DIAGNOSIS — E1122 Type 2 diabetes mellitus with diabetic chronic kidney disease: Secondary | ICD-10-CM | POA: Diagnosis not present

## 2020-03-23 DIAGNOSIS — N183 Chronic kidney disease, stage 3 unspecified: Secondary | ICD-10-CM | POA: Diagnosis not present

## 2020-03-23 DIAGNOSIS — I2510 Atherosclerotic heart disease of native coronary artery without angina pectoris: Secondary | ICD-10-CM | POA: Insufficient documentation

## 2020-03-23 HISTORY — PX: CIRCUMCISION: SHX1350

## 2020-03-23 LAB — BASIC METABOLIC PANEL
Anion gap: 9 (ref 5–15)
BUN: 36 mg/dL — ABNORMAL HIGH (ref 8–23)
CO2: 25 mmol/L (ref 22–32)
Calcium: 8.3 mg/dL — ABNORMAL LOW (ref 8.9–10.3)
Chloride: 103 mmol/L (ref 98–111)
Creatinine, Ser: 1.73 mg/dL — ABNORMAL HIGH (ref 0.61–1.24)
GFR calc Af Amer: 43 mL/min — ABNORMAL LOW (ref >60)
GFR calc non Af Amer: 37 mL/min — ABNORMAL LOW (ref >60)
Glucose, Bld: 145 mg/dL — ABNORMAL HIGH (ref 70–99)
Potassium: 5.7 mmol/L — ABNORMAL HIGH (ref 3.5–5.1)
Sodium: 137 mmol/L (ref 135–145)

## 2020-03-23 LAB — GLUCOSE, CAPILLARY: Glucose-Capillary: 151 mg/dL — ABNORMAL HIGH (ref 70–99)

## 2020-03-23 LAB — HEMOGLOBIN A1C
Hgb A1c MFr Bld: 9 % — ABNORMAL HIGH (ref 4.8–5.6)
Mean Plasma Glucose: 211.6 mg/dL

## 2020-03-23 SURGERY — CIRCUMCISION, ADULT
Anesthesia: General | Site: Penis

## 2020-03-23 MED ORDER — LIDOCAINE 2% (20 MG/ML) 5 ML SYRINGE
INTRAMUSCULAR | Status: DC | PRN
  Administered 2020-03-23: 60 mg via INTRAVENOUS

## 2020-03-23 MED ORDER — PROPOFOL 10 MG/ML IV BOLUS
INTRAVENOUS | Status: AC
  Filled 2020-03-23: qty 20

## 2020-03-23 MED ORDER — LACTATED RINGERS IV SOLN: INTRAVENOUS | Status: DC

## 2020-03-23 MED ORDER — CEFAZOLIN SODIUM-DEXTROSE 2-4 GM/100ML-% IV SOLN
2.0000 g | INTRAVENOUS | Status: AC
  Administered 2020-03-23: 2 g via INTRAVENOUS
  Filled 2020-03-23: qty 100

## 2020-03-23 MED ORDER — PROPOFOL 10 MG/ML IV BOLUS
INTRAVENOUS | Status: DC | PRN
  Administered 2020-03-23: 200 mg via INTRAVENOUS

## 2020-03-23 MED ORDER — DEXAMETHASONE SODIUM PHOSPHATE 10 MG/ML IJ SOLN
INTRAMUSCULAR | Status: DC | PRN
  Administered 2020-03-23: 4 mg via INTRAVENOUS

## 2020-03-23 MED ORDER — BUPIVACAINE-EPINEPHRINE (PF) 0.25% -1:200000 IJ SOLN
INTRAMUSCULAR | Status: AC
  Filled 2020-03-23: qty 30

## 2020-03-23 MED ORDER — ACETAMINOPHEN 500 MG PO TABS
ORAL_TABLET | ORAL | Status: AC
  Filled 2020-03-23: qty 2

## 2020-03-23 MED ORDER — BACITRACIN ZINC 500 UNIT/GM EX OINT
TOPICAL_OINTMENT | CUTANEOUS | Status: AC
  Filled 2020-03-23: qty 28.35

## 2020-03-23 MED ORDER — ORAL CARE MOUTH RINSE: 15.0000 mL | Freq: Once | OROMUCOSAL | Status: AC

## 2020-03-23 MED ORDER — 0.9 % SODIUM CHLORIDE (POUR BTL) OPTIME
TOPICAL | Status: DC | PRN
  Administered 2020-03-23: 1000 mL

## 2020-03-23 MED ORDER — ONDANSETRON HCL 4 MG/2ML IJ SOLN
INTRAMUSCULAR | Status: DC | PRN
  Administered 2020-03-23: 4 mg via INTRAVENOUS

## 2020-03-23 MED ORDER — LIDOCAINE 2% (20 MG/ML) 5 ML SYRINGE
INTRAMUSCULAR | Status: AC
  Filled 2020-03-23: qty 5

## 2020-03-23 MED ORDER — FENTANYL CITRATE (PF) 100 MCG/2ML IJ SOLN
INTRAMUSCULAR | Status: DC | PRN
  Administered 2020-03-23: 50 ug via INTRAVENOUS

## 2020-03-23 MED ORDER — CHLORHEXIDINE GLUCONATE 0.12 % MT SOLN
15.0000 mL | Freq: Once | OROMUCOSAL | Status: AC
  Administered 2020-03-23: 15 mL via OROMUCOSAL

## 2020-03-23 MED ORDER — BUPIVACAINE HCL (PF) 0.5 % IJ SOLN
INTRAMUSCULAR | Status: AC
  Filled 2020-03-23: qty 30

## 2020-03-23 MED ORDER — ACETAMINOPHEN 500 MG PO TABS
1000.0000 mg | ORAL_TABLET | Freq: Once | ORAL | Status: AC
  Administered 2020-03-23: 1000 mg via ORAL

## 2020-03-23 MED ORDER — PHENYLEPHRINE 40 MCG/ML (10ML) SYRINGE FOR IV PUSH (FOR BLOOD PRESSURE SUPPORT)
PREFILLED_SYRINGE | INTRAVENOUS | Status: DC | PRN
  Administered 2020-03-23 (×2): 60 ug via INTRAVENOUS
  Administered 2020-03-23: 80 ug via INTRAVENOUS
  Administered 2020-03-23 (×2): 40 ug via INTRAVENOUS
  Administered 2020-03-23: 60 ug via INTRAVENOUS
  Administered 2020-03-23 (×2): 80 ug via INTRAVENOUS
  Administered 2020-03-23: 40 ug via INTRAVENOUS
  Administered 2020-03-23: 80 ug via INTRAVENOUS
  Administered 2020-03-23: 60 ug via INTRAVENOUS
  Administered 2020-03-23: 80 ug via INTRAVENOUS

## 2020-03-23 MED ORDER — BUPIVACAINE HCL (PF) 0.5 % IJ SOLN
INTRAMUSCULAR | Status: DC | PRN
  Administered 2020-03-23: 14 mL

## 2020-03-23 MED ORDER — SODIUM CHLORIDE 0.9 % IV SOLN: INTRAVENOUS | Status: DC | PRN

## 2020-03-23 MED ORDER — OXYCODONE-ACETAMINOPHEN 5-325 MG PO TABS: 1.0000 | ORAL_TABLET | ORAL | 0 refills | Status: AC | PRN

## 2020-03-23 MED ORDER — FENTANYL CITRATE (PF) 100 MCG/2ML IJ SOLN
INTRAMUSCULAR | Status: AC
  Filled 2020-03-23: qty 2

## 2020-03-23 SURGICAL SUPPLY — 31 items
BLADE SURG 15 STRL LF DISP TIS (BLADE) ×1 IMPLANT
BLADE SURG 15 STRL SS (BLADE) ×3
BNDG COHESIVE 1X5 TAN STRL LF (GAUZE/BANDAGES/DRESSINGS) ×3 IMPLANT
BNDG COHESIVE 2X5 TAN STRL LF (GAUZE/BANDAGES/DRESSINGS) ×2 IMPLANT
BNDG CONFORM 2 STRL LF (GAUZE/BANDAGES/DRESSINGS) ×2 IMPLANT
COVER SURGICAL LIGHT HANDLE (MISCELLANEOUS) ×3 IMPLANT
COVER WAND RF STERILE (DRAPES) IMPLANT
DRAPE LAPAROTOMY T 98X78 PEDS (DRAPES) ×3 IMPLANT
DRAPE UTILITY XL STRL (DRAPES) ×2 IMPLANT
ELECT REM PT RETURN 15FT ADLT (MISCELLANEOUS) ×3 IMPLANT
GAUZE PETROLATUM 1 X8 (GAUZE/BANDAGES/DRESSINGS) ×3 IMPLANT
GAUZE XEROFORM 1X8 LF (GAUZE/BANDAGES/DRESSINGS) ×2 IMPLANT
GLOVE BIOGEL M STRL SZ7.5 (GLOVE) ×3 IMPLANT
GOWN STRL REUS W/TWL LRG LVL3 (GOWN DISPOSABLE) ×3 IMPLANT
KIT BASIN OR (CUSTOM PROCEDURE TRAY) ×3 IMPLANT
KIT TURNOVER KIT A (KITS) IMPLANT
NEEDLE HYPO 22GX1.5 SAFETY (NEEDLE) ×3 IMPLANT
NS IRRIG 1000ML POUR BTL (IV SOLUTION) IMPLANT
PACK BASIC VI WITH GOWN DISP (CUSTOM PROCEDURE TRAY) ×3 IMPLANT
PENCIL SMOKE EVACUATOR (MISCELLANEOUS) IMPLANT
SPONGE LAP 18X18 RF (DISPOSABLE) ×3 IMPLANT
SUT CHROMIC 3 0 PS 2 (SUTURE) IMPLANT
SUT CHROMIC 3 0 SH 27 (SUTURE) ×6 IMPLANT
SUT CHROMIC 4 0 SH 27 (SUTURE) ×3 IMPLANT
SUT SILK 2 0 (SUTURE)
SUT SILK 2-0 18XBRD TIE 12 (SUTURE) IMPLANT
SYR CONTROL 10ML LL (SYRINGE) IMPLANT
TOWEL OR 17X26 10 PK STRL BLUE (TOWEL DISPOSABLE) ×3 IMPLANT
TOWEL OR NON WOVEN STRL DISP B (DISPOSABLE) ×3 IMPLANT
TUBING INSUFFLATION 10FT LAP (TUBING) IMPLANT
WATER STERILE IRR 1000ML POUR (IV SOLUTION) IMPLANT

## 2020-03-23 NOTE — Op Note (Signed)
Operative report  Preop diagnosis: Penile phimosis Postop diagnosis: Penile phimosis Procedure: Circumcision Surgeon: Milford Cage Anesthesia: General Estimated blood loss: Minimal Complications: None  Operative note: After obtaining informed consent for the patient is taken the [facility] OR suite placed under general anesthesia.  Placed in the supine position genitalia prepped and draped in usual sterile fashion.  Proper pause and timeout was performed.  The coronal sulcus could see be seen through the overlying foreskin this was marked with marking pen.  Circumcising incision was then made overlying this mark.  Foreskin was then retracted and similar circumcising incision was made proximally 2 to 3 mm beneath the coronal sulcus circumferentially.  The skin between the 2 previously made incisions was then excised sharply in the avascular plane and removed.  Hemostasis was obtained with Bovie cautery.  The frenular line was then aligned with the frenular ventrally and the remaining shaft foreskin was then reapproximated circumferentially to the skin beneath the coronal sulcus with interrupted 4 and 3-0 chromic sutures.  Good hemostasis was noted.  Vaseline gauze wrap and then compression wrap with 1 inch Kerlix and Coban was applied.  Half percent Marcaine penile block was performed with an of the case for postoperative analgesia.  Procedure was terminated he was awakened from anesthesia and taken back to the recovery room in stable condition.  No immediate complication from the procedure.

## 2020-03-23 NOTE — Transfer of Care (Signed)
Immediate Anesthesia Transfer of Care Note  Patient: Mason [name]  Procedure(s) Performed: CIRCUMCISION ADULT (N/A Penis)  Patient Location: PACU  Anesthesia Type:General  Level of Consciousness: awake, drowsy and patient cooperative  Airway & Oxygen Therapy: Patient Spontanous Breathing and Patient connected to face mask oxygen  Post-op Assessment: Report given to RN and Post -op Vital signs reviewed and stable  Post vital signs: Reviewed and stable  Last Vitals:  Vitals Value Taken Time  BP    Temp    Pulse 70 03/23/20 1414  Resp 12 03/23/20 1414  SpO2 100 % 03/23/20 1414  Vitals shown include unvalidated device data.  Last Pain:  Vitals:   03/23/20 1100  TempSrc:   PainSc: 0-No pain      Patients Stated Pain Goal: 3 (96/04/54 0981)  Complications: No complications documented.

## 2020-03-23 NOTE — Anesthesia Postprocedure Evaluation (Signed)
Anesthesia Post Note  Patient: PATTERSON HOLLENBAUGH  Procedure(s) Performed: CIRCUMCISION ADULT (N/A Penis)     Patient location during evaluation: PACU Anesthesia Type: General Level of consciousness: awake and alert, oriented and patient cooperative Pain management: pain level controlled Vital Signs Assessment: post-procedure vital signs reviewed and stable Respiratory status: spontaneous breathing, nonlabored ventilation and respiratory function stable Cardiovascular status: blood pressure returned to baseline and stable Postop Assessment: no apparent nausea or vomiting Anesthetic complications: no   No complications documented.  Last Vitals:  Vitals:   03/23/20 1414 03/23/20 1415  BP: 117/74 114/74  Pulse: 70 70  Resp: 12 11  Temp: (!) 36.3 C   SpO2: 100% 100%    Last Pain:  Vitals:   03/23/20 1414  TempSrc:   PainSc: 0-No pain                 Pervis Hocking

## 2020-03-23 NOTE — Anesthesia Procedure Notes (Signed)
Procedure Name: LMA Insertion Date/Time: 03/23/2020 1:13 PM Performed by: Eben Burow, CRNA Pre-anesthesia Checklist: Patient identified, Emergency Drugs available, Suction available, Patient being monitored and Timeout performed Patient Re-evaluated:Patient Re-evaluated prior to induction Oxygen Delivery Method: Circle system utilized Preoxygenation: Pre-oxygenation with 100% oxygen Induction Type: IV induction Ventilation: Mask ventilation without difficulty LMA: LMA inserted LMA Size: 4.0 Number of attempts: 1 Placement Confirmation: positive ETCO2 Tube secured with: Tape Dental Injury: Teeth and Oropharynx as per pre-operative assessment

## 2020-03-24 ENCOUNTER — Encounter (HOSPITAL_COMMUNITY): Payer: Self-pay | Admitting: Urology

## 2020-03-24 LAB — SURGICAL PATHOLOGY

## 2020-03-25 ENCOUNTER — Other Ambulatory Visit: Payer: Self-pay

## 2020-03-25 ENCOUNTER — Telehealth: Payer: Self-pay | Admitting: Family Medicine

## 2020-03-25 DIAGNOSIS — E1165 Type 2 diabetes mellitus with hyperglycemia: Secondary | ICD-10-CM

## 2020-03-25 DIAGNOSIS — IMO0002 Reserved for concepts with insufficient information to code with codable children: Secondary | ICD-10-CM

## 2020-03-25 MED ORDER — FREESTYLE LIBRE SENSOR SYSTEM MISC: 0 refills | Status: DC

## 2020-03-25 NOTE — Telephone Encounter (Signed)
Patient is referring to freestyle libre sensor which he is already getting. New Rx sent in for sensors but wanted to clarify how often he is supposed to check his blood sugars daily.

## 2020-03-25 NOTE — Telephone Encounter (Signed)
Patient is calling in regards to the sensors that he needs for his phone to check his diabetes. He said that he needs a prescription for it and he hasn't heard anything back about it. Please give him a call back at [phone].

## 2020-03-25 NOTE — Telephone Encounter (Signed)
Four times a day minimum. Before breakfast, before lunch, before supper, and a bedtime

## 2020-03-26 ENCOUNTER — Other Ambulatory Visit: Payer: Self-pay

## 2020-03-26 DIAGNOSIS — E1165 Type 2 diabetes mellitus with hyperglycemia: Secondary | ICD-10-CM

## 2020-03-26 DIAGNOSIS — IMO0002 Reserved for concepts with insufficient information to code with codable children: Secondary | ICD-10-CM

## 2020-03-26 MED ORDER — FREESTYLE LIBRE SENSOR SYSTEM MISC: 1 refills | Status: DC

## 2020-03-26 NOTE — Telephone Encounter (Signed)
Updated Rx sent for sensor to local pharmacy.

## 2020-03-30 DIAGNOSIS — H401112 Primary open-angle glaucoma, right eye, moderate stage: Secondary | ICD-10-CM | POA: Diagnosis not present

## 2020-03-30 DIAGNOSIS — H35372 Puckering of macula, left eye: Secondary | ICD-10-CM | POA: Diagnosis not present

## 2020-03-30 DIAGNOSIS — E119 Type 2 diabetes mellitus without complications: Secondary | ICD-10-CM | POA: Diagnosis not present

## 2020-03-30 DIAGNOSIS — Z9889 Other specified postprocedural states: Secondary | ICD-10-CM | POA: Diagnosis not present

## 2020-03-30 DIAGNOSIS — Z961 Presence of intraocular lens: Secondary | ICD-10-CM | POA: Diagnosis not present

## 2020-03-30 DIAGNOSIS — H401123 Primary open-angle glaucoma, left eye, severe stage: Secondary | ICD-10-CM | POA: Diagnosis not present

## 2020-04-06 DIAGNOSIS — N471 Phimosis: Secondary | ICD-10-CM | POA: Diagnosis not present

## 2020-04-06 DIAGNOSIS — N481 Balanitis: Secondary | ICD-10-CM | POA: Diagnosis not present

## 2020-04-28 ENCOUNTER — Ambulatory Visit (INDEPENDENT_AMBULATORY_CARE_PROVIDER_SITE_OTHER): Payer: Medicare Other

## 2020-04-28 DIAGNOSIS — I4821 Permanent atrial fibrillation: Secondary | ICD-10-CM | POA: Diagnosis not present

## 2020-04-29 LAB — CUP PACEART REMOTE DEVICE CHECK
Battery Remaining Longevity: 41 mo
Battery Voltage: 2.96 V
Brady Statistic AP VP Percent: 0 %
Brady Statistic AP VS Percent: 0 %
Brady Statistic AS VP Percent: 0 %
Brady Statistic AS VS Percent: 0 %
Brady Statistic RA Percent Paced: 0 %
Brady Statistic RV Percent Paced: 99.87 %
Date Time Interrogation Session: 20211005012505
HighPow Impedance: 70 Ohm
Implantable Lead Implant Date: 20130715
Implantable Lead Implant Date: 20180614
Implantable Lead Implant Date: 20180614
Implantable Lead Location: 753858
Implantable Lead Location: 753859
Implantable Lead Location: 753860
Implantable Lead Model: 4598
Implantable Pulse Generator Implant Date: 20180614
Lead Channel Impedance Value: 184.154
Lead Channel Impedance Value: 188.1 Ohm
Lead Channel Impedance Value: 195.429
Lead Channel Impedance Value: 212.8 Ohm
Lead Channel Impedance Value: 218.087
Lead Channel Impedance Value: 342 Ohm
Lead Channel Impedance Value: 342 Ohm
Lead Channel Impedance Value: 399 Ohm
Lead Channel Impedance Value: 418 Ohm
Lead Channel Impedance Value: 456 Ohm
Lead Channel Impedance Value: 475 Ohm
Lead Channel Impedance Value: 513 Ohm
Lead Channel Impedance Value: 532 Ohm
Lead Channel Impedance Value: 646 Ohm
Lead Channel Impedance Value: 665 Ohm
Lead Channel Impedance Value: 703 Ohm
Lead Channel Impedance Value: 722 Ohm
Lead Channel Impedance Value: 722 Ohm
Lead Channel Pacing Threshold Amplitude: 0.375 V
Lead Channel Pacing Threshold Amplitude: 0.625 V
Lead Channel Pacing Threshold Pulse Width: 0.4 ms
Lead Channel Pacing Threshold Pulse Width: 1 ms
Lead Channel Sensing Intrinsic Amplitude: 0.625 mV
Lead Channel Sensing Intrinsic Amplitude: 20.5 mV
Lead Channel Sensing Intrinsic Amplitude: 20.5 mV
Lead Channel Setting Pacing Amplitude: 1 V
Lead Channel Setting Pacing Amplitude: 2 V
Lead Channel Setting Pacing Pulse Width: 0.4 ms
Lead Channel Setting Pacing Pulse Width: 1 ms
Lead Channel Setting Sensing Sensitivity: 0.3 mV

## 2020-05-03 NOTE — Progress Notes (Signed)
Remote ICD transmission.   

## 2020-05-20 ENCOUNTER — Other Ambulatory Visit: Payer: Self-pay | Admitting: Family Medicine

## 2020-06-01 ENCOUNTER — Telehealth: Payer: Self-pay | Admitting: Family Medicine

## 2020-06-01 DIAGNOSIS — L821 Other seborrheic keratosis: Secondary | ICD-10-CM | POA: Diagnosis not present

## 2020-06-01 DIAGNOSIS — D225 Melanocytic nevi of trunk: Secondary | ICD-10-CM | POA: Diagnosis not present

## 2020-06-01 DIAGNOSIS — L218 Other seborrheic dermatitis: Secondary | ICD-10-CM | POA: Diagnosis not present

## 2020-06-01 DIAGNOSIS — L820 Inflamed seborrheic keratosis: Secondary | ICD-10-CM | POA: Diagnosis not present

## 2020-06-01 DIAGNOSIS — B078 Other viral warts: Secondary | ICD-10-CM | POA: Diagnosis not present

## 2020-06-01 DIAGNOSIS — D1801 Hemangioma of skin and subcutaneous tissue: Secondary | ICD-10-CM | POA: Diagnosis not present

## 2020-06-01 DIAGNOSIS — L578 Other skin changes due to chronic exposure to nonionizing radiation: Secondary | ICD-10-CM | POA: Diagnosis not present

## 2020-06-01 DIAGNOSIS — L570 Actinic keratosis: Secondary | ICD-10-CM | POA: Diagnosis not present

## 2020-06-01 NOTE — Telephone Encounter (Signed)
Patient called to ask when he is due for his A1c. His last was done 03/23/20.

## 2020-06-01 NOTE — Telephone Encounter (Signed)
Spoke with patient, 3 month f/u scheduled with PCP.

## 2020-06-07 ENCOUNTER — Other Ambulatory Visit: Payer: Self-pay | Admitting: Family Medicine

## 2020-06-10 ENCOUNTER — Other Ambulatory Visit: Payer: Self-pay | Admitting: Family Medicine

## 2020-06-14 ENCOUNTER — Encounter: Payer: Self-pay | Admitting: Cardiovascular Disease

## 2020-06-14 ENCOUNTER — Ambulatory Visit (INDEPENDENT_AMBULATORY_CARE_PROVIDER_SITE_OTHER): Payer: Medicare Other | Admitting: Cardiovascular Disease

## 2020-06-14 ENCOUNTER — Other Ambulatory Visit: Payer: Self-pay

## 2020-06-14 VITALS — BP 128/72 | HR 74 | Ht 71.0 in | Wt 243.0 lb

## 2020-06-14 DIAGNOSIS — I4821 Permanent atrial fibrillation: Secondary | ICD-10-CM

## 2020-06-14 DIAGNOSIS — I4811 Longstanding persistent atrial fibrillation: Secondary | ICD-10-CM

## 2020-06-14 DIAGNOSIS — I739 Peripheral vascular disease, unspecified: Secondary | ICD-10-CM

## 2020-06-14 DIAGNOSIS — Z95810 Presence of automatic (implantable) cardiac defibrillator: Secondary | ICD-10-CM | POA: Diagnosis not present

## 2020-06-14 DIAGNOSIS — I5022 Chronic systolic (congestive) heart failure: Secondary | ICD-10-CM | POA: Diagnosis not present

## 2020-06-14 DIAGNOSIS — E669 Obesity, unspecified: Secondary | ICD-10-CM | POA: Diagnosis not present

## 2020-06-14 DIAGNOSIS — E785 Hyperlipidemia, unspecified: Secondary | ICD-10-CM | POA: Diagnosis not present

## 2020-06-14 DIAGNOSIS — I442 Atrioventricular block, complete: Secondary | ICD-10-CM

## 2020-06-14 DIAGNOSIS — E1169 Type 2 diabetes mellitus with other specified complication: Secondary | ICD-10-CM | POA: Diagnosis not present

## 2020-06-14 DIAGNOSIS — I10 Essential (primary) hypertension: Secondary | ICD-10-CM | POA: Diagnosis not present

## 2020-06-14 NOTE — Patient Instructions (Signed)

## 2020-06-14 NOTE — Progress Notes (Signed)
`   Cardiology Office Note    Date:  06/14/2020   ID:  YEHUDA [name], DOB 10-29-1940, MRN [id number]  PCP:  Tammi Sou, MD  Cardiologist:   [name], MD   Chief Complaint  Patient presents with  . Congestive Heart Failure  . Pacemaker Check    CRT-D    History of Present Illness:  Mason [name] is a 79 y.o. male with nonischemic cardiomyopathy, permanent atrial fibrillation and complete heart block who underwent implantation of a CRT-D in June 2018 for deteriorating left ventricular systolic function.  He is pacemaker dependent.  Continues to have NYHA functional class III exertional dyspnea, but able to perform all activities of daily living without limitations.  Still mows his own lawn but has given up working for others. The patient specifically denies any chest pain at rest or with exertion, dyspnea at rest or with usual exertion, orthopnea, paroxysmal nocturnal dyspnea, syncope, palpitations, focal neurological deficits, intermittent claudication, lower extremity edema, unexplained weight gain, cough, hemoptysis or wheezing.  Glycemic control remains poor with a hemoglobin A1c of 9% despite the fact that he is taking Jardiance and has been started on insulin as well.  Lipid profile was favorable. HDL cholesterol is borderline low, but he does not have known CAD or PAD.  His device is a Medtronic Claria CRT-D device with an estimated longevity of another 3.3 years.  He has 99.9% effective biventricular pacing, with a distinct positive R wave 1.  He is in permanent atrial fibrillation.  His OptiVol is stable, has not been out of range since March.  Left ventricular pacing is in the LV 3-RV coil configuration.  He is pacemaker dependent without any escape rhythm when pacing is taken down to 30 bpm..  He has not had ventricular tachycardia, not even nonsustained events.  Heart rate histogram distribution is appropriate.  Lead parameters are all excellent and stable.  Switched  from spironolactone to eplerenone due to gynecomastia, with resolution of the side effects. He has had no bleeding problems and is compliant with Xarelto anticoagulation.  He has not had any injuries or falls  He does not have coronary or other vascular disorders, but has numerous risk factors including type 2 diabetes, hyperlipidemia, mild obesity. He has a history of gout.  He has gained about 20 pounds since his last appointment.  He is now taking both Lantus and Jardiance.  The cause of Mason [name]'s cardiomyopathy remains uncertain.  He initially presented with a "heart attack" and was in the ICU in the [place] [facility] at [facility] for 3 to 4 days in 1976.  He has had a depressed left ventricular systolic function for many years.  Cardiac catheterization most recently performed in 2019 does show nonobstructive atherosclerosis.[name]  He has a Water engineer including 2 stints in [place] and he knows that he was exposed to [employer].  He underwent right and left heart catheterization for optimization of heart failure therapy on March 22, 2018.  Right atrial pressure was 7 mmHg, wedge pressure was 13 and PA pressure was 30/10 (mean 22) millimeters Hg.  The cardiac index was 2.0.  The coronary arteries had only minimal atherosclerosis.  Radial artery access was difficult.  On the catheterization he developed transient weakness of the left upper extremity and left facial droop, from which he recovered fully thankfully.  His dose of beta-blocker was reduced.  He is not on diuretics.  He subsequently underwent cardiopulmonary stress testing on September 18  and did quite well with a mildly reduced peak VO2 of 16.8 mL/kg/min (79% of control).  There was still evidence of drug-induced chronotropic incompetence with a maximum heart rate only reached 74% of predicted.     Past Medical History:  Diagnosis Date  . AICD (automatic cardioverter/defibrillator) present 2018   MDT CRT-D.  Fatigue-->completely pacer  dependent.  Pacer settings adjusted 12/2017 to allow more chronotrophc variance with ADL's//exertion.  . Arthritis    Pt is s/p eft reverse total shoulder arthroplasty.  . Balanitis    chronic fungal  . BPH (benign prostatic hypertrophy) 06/2011   Irritative sx's; pt declined trial of anticholinergic per Urology records  . Chronic combined systolic and diastolic heart failure ([place]) 05/31/2012   Nonischemic:  EF 40-45%, LA mod-severe dilated, AFIB.   02/2016 EF 40%, diffuse hypokinesis, grade 2 DD.  Myoc perf imaging showed EF 32% 04/2016.  Pt upgraded to CRT-D 01/04/17.  [name] Chronic renal insufficiency, stage III (moderate) (HCC) 2015   CrCl about 60 ml/min  . Complete heart block (HCC)    Has dual chamber pacer.  . Depression   . DOE (dyspnea on exertion)    NYHA class II/III CHF  . Dyspnea 2021   with exertion, bending over  . Episodic low back pain 01/22/2013   w/intermittent radiculitis (12/2014 his neurologist referred him to pain mgmt for epidural steroid injection)  . Erectile dysfunction 2019   due to zoloft--urol rx'd viagra  . GERD (gastroesophageal reflux disease)   . H/O tilt table evaluation 11/02/05   negative  . Helicobacter pylori gastritis 01/2016  . History of adenomatous polyp of colon 10/12/11   Dr. [name] (3 right side of colon- tubular adenomas removed)  . History of cardiovascular stress test 05/28/12   no ischemia, EF 37%, imaging results are unchanged and within normal variance  . History of chronic prostatitis   . History of kidney stones   . History of vertigo    + Hx of posterior HA's.  Neuro (Dr. Erling Cruz) eval 2011.  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  . Hyperlipidemia   . Hypertension   . Lumbar spondylosis    lumbosacral radiculopathy at L4 by EMG testing, right foot drop (neurologist is Dr. Linus Salmons with [facility] in [place])--neurologist referred him to neurosurgery  .  Migraine    "used to have them all the time; none for years" (01/04/2017)  . Myocardial infarction [facility]) ?1970s   not entirely certain of this  . Nephrolithiasis 07/2012   Left UVJ 2 mm stone with dilation of renal collecting system and slight hydroureter on right  . Neuropathy   . NICM (nonischemic cardiomyopathy) ([place])    a. 02/2018 Cath: LM nl, LAD min irregs, LCX no, RCA 20d. OIZT24. Fick CO/CI 4.4/2.0.  . 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  Since no CAD on cath 05/2016, cards recommends upgrade to CRT-D.  [name] Permanent atrial fibrillation ([place])    DCCV 07/09/13-converted, lasted two days, then back into afib--needs lifetime anticoagulation (Xarelto as of 09/2014)  . Prostate cancer screening 09/2017   done by urol annually (normal prostate exam documented + PSA 0.84 as of 10/01/17 urol f/u.  10/2018 urol f/u PSA 0.6, no prostate nodule.  . Rectus diastasis   . Right ankle sprain 08/2017   w/distal fibula avulsion fx noted on u/s but not plain film-(Dr. Hudnall).  . Skin cancer of  arm, left    "burned it off" (01/04/2017)  . TIA (transient ischemic attack)    L face and L arm weakness. Peri procedural->a. 03/22/2018 following cath. CT head neg. No MRI b/c has pacer. Likely due to embolus to distal branch of RMCA  . Type II diabetes mellitus ([place])     Past Surgical History:  Procedure Laterality Date  . ABI's Bilateral 05/21/2018   normal  . BACK SURGERY    . BIV ICD INSERTION CRT-D N/A 01/04/2017   Procedure: BiV ICD ;  Surgeon: Constance Haw, MD;  Location: [place] [facility];  Service: Cardiovascular;  Laterality: N/A;  . CARDIAC CATHETERIZATION N/A 06/14/2016   Minimal nonobstructive dz, EF 25-35%.  Procedure: Left Heart Cath and Coronary Angiography;  Surgeon: Peter M Martinique, MD;  Location: [place] [facility];  Service: Cardiovascular;  Laterality: N/A;  . CARDIOVASCULAR STRESS TEST  2012   2012 nuclear perfusion  study: low risk scan; 04/2016 normal myocardial perfusion imaging, EF 32%.  [name] CARDIOVERSION  07/09/2012   Procedure: CARDIOVERSION;  Surgeon: [name], MD;  Location: [place] [facility];  Service: Cardiovascular;  Laterality: N/A;  . CATARACT EXTRACTION W/ INTRAOCULAR LENS IMPLANT & ANTERIOR VITRECTOMY, BILATERAL Bilateral   . CIRCUMCISION N/A 03/23/2020   Procedure: CIRCUMCISION ADULT;  Surgeon: Remi Haggard, MD;  Location: [facility];  Service: Urology;  Laterality: N/A;  . COLONOSCOPY W/ POLYPECTOMY  approx 2006; repeated 09/2011   Polyps on 2013 EGD as well, repeat 12/2014  . ESOPHAGOGASTRODUODENOSCOPY  10/18/06   Done due to chronic GERD: Normal, bx showed no barrett's esophagus (Dr. [name])  . FLEXOR TENDON REPAIR Left 10/02/2016   Procedure: LEFT RING FINGER WOUND EXPORATION AND FLEXOR TENDON REPAIR AND NERVE REPAIR;  Surgeon: Milly Jakob, MD;  Location: [place];  Service: Orthopedics;  Laterality: Left;  . INSERT / REPLACE / REMOVE PACEMAKER  02/05/2012   dual chamber, sinus node dysfunction, sinus arrest, PAF, Medtronic Revo serial#[id number] H: last checked 05/2015  . LUMBAR LAMINECTOMY Left 1976   L4-5  . PACEMAKER REMOVAL  01/04/2017  . PERMANENT PACEMAKER INSERTION N/A 02/05/2012   Procedure: PERMANENT PACEMAKER INSERTION;  Surgeon: [name], MD; Generator Medtronic Revo model [place] serial number [id number] H Laterality: N/A;  . RETINAL DETACHMENT SURGERY Left ~ 1999  . REVERSE SHOULDER ARTHROPLASTY Left 2018   Left shoulder reverse [name] in [place]).  [name] RIGHT/LEFT HEART CATH AND CORONARY ANGIOGRAPHY N/A 03/22/2018   EF 30-35%, no CAD.  Procedure: RIGHT/LEFT HEART CATH AND CORONARY ANGIOGRAPHY;  Surgeon: Jolaine Artist, MD;  Location: [place] [facility];  Service: Cardiovascular;  Laterality: N/A;  . TRANSTHORACIC ECHOCARDIOGRAM  08/25/10; 05/2012; 03/23/16;12/2017   mild asymmetric LVH, normal systolic function, normal diastolic fxn, mild-to-mod mitral regurg,  mild aortic valve sclerosis and trace AI, mild aortic root dilatation. 2014 f/u showed EF 40-45%, mod LAE, A FIB.  02/2016 EF 40%, diffuse hypokinesis, grade 2 DD. 12/2017 EF 35-40%,diffuse hypokin,grd III DD, mild MR  . WISDOM TOOTH EXTRACTION      Current Medications: Outpatient Medications Prior to Visit  Medication Sig Dispense Refill  . acetaminophen (TYLENOL) 325 MG tablet Take 650 mg by mouth every 6 (six) hours as needed for moderate pain.    . BD INSULIN SYRINGE U/F 31G X 5/16" 1 ML MISC USE DAILY AS DIRECTED 100 each 3  . COMBIGAN 0.2-0.5 % ophthalmic solution Place 1 drop into both eyes at bedtime.     . Continuous Blood Gluc Receiver (FREESTYLE  LIBRE 2 READER) DEVI USE TO CHECK BLOOD SUGAR 1-2 TIMES DAILY. 1 each 3  . Continuous Blood Gluc Sensor (FREESTYLE LIBRE SENSOR SYSTEM) MISC USE TO CHECK BLOOD SUGARS 4 TIMES DAILY. Before breakfast, before lunch, before supper, and at bedtime 1 each 1  . ENTRESTO 97-103 MG Take 1 tablet by mouth 2 (two) times daily. 180 tablet 2  . eplerenone (INSPRA) 25 MG tablet TAKE 1 TABLET DAILY (Patient taking differently: Take 25 mg by mouth daily. ) 90 tablet 3  . gabapentin (NEURONTIN) 300 MG capsule 2 caps po bid (Patient taking differently: Take 300 mg by mouth in the morning and at bedtime. ) 360 capsule 3  . glucose blood test strip Use to check blood sugars 1-2 times daily 100 each 3  . insulin aspart (NOVOLOG FLEXPEN) 100 UNIT/ML FlexPen 16 U SQ at the time of each meal 18 mL 6  . insulin glargine (LANTUS) 100 UNIT/ML injection INJECT 54 UNITS UNDER THE SKIN DAILY. PATIENT WILL BE TITRATING HIS DOSE PERIODICALLY 54 mL 1  . Insulin Pen Needle (PEN NEEDLES) 31G X 5 MM MISC USE TO INJECT INSULIN SQ. 100 each 2  . JARDIANCE 10 MG TABS tablet TAKE 1 TABLET DAILY 90 tablet 3  . Lancets (FREESTYLE) lancets USE AS INSTRUCTED 100 each 11  . latanoprost (XALATAN) 0.005 % ophthalmic solution Place 1 drop into both eyes at bedtime.   12  . meclizine  (ANTIVERT) 25 MG tablet TAKE 1 TABLET THREE TIMES A DAY AS NEEDED FOR DIZZINESS (Patient taking differently: Take 25 mg by mouth 3 (three) times daily as needed for dizziness. ) 270 tablet 1  . metoprolol succinate (TOPROL-XL) 25 MG 24 hr tablet Take 1 tablet (25 mg total) by mouth daily. Take with or immediately following a meal. 90 tablet 1  . omeprazole (PRILOSEC) 40 MG capsule TAKE 1 CAPSULE DAILY (Patient taking differently: Take 40 mg by mouth daily. ) 90 capsule 1  . oxybutynin (DITROPAN-XL) 5 MG 24 hr tablet TAKE 1 TABLET AT BEDTIME 90 tablet 3  . oxyCODONE-acetaminophen (PERCOCET) 5-325 MG tablet Take 1 tablet by mouth every 4 (four) hours as needed for severe pain. 20 tablet 0  . rOPINIRole (REQUIP) 1 MG tablet TAKE 1 TABLET DAILY 90 tablet 3  . sertraline (ZOLOFT) 100 MG tablet Take 1 tablet (100 mg total) by mouth daily. 90 tablet 1  . simvastatin (ZOCOR) 20 MG tablet TAKE 1 TABLET EVERY EVENING (Patient taking differently: Take 20 mg by mouth every evening. ) 90 tablet 3  . XARELTO 20 MG TABS tablet TAKE 1 TABLET DAILY WITH SUPPER (Patient taking differently: Take 20 mg by mouth daily with supper. ) 90 tablet 3   No facility-administered medications prior to visit.     Allergies:   Patient has no known allergies.   Social History   Socioeconomic History  . Marital status: Married    Spouse name: Not on file  . Number of children: Not on file  . Years of education: Not on file  . Highest education level: Not on file  Occupational History  . Not on file  Tobacco Use  . Smoking status: Never Smoker  . Smokeless tobacco: Never Used  Vaping Use  . Vaping Use: Never used  Substance and Sexual Activity  . Alcohol use: Yes    Comment: occ  . Drug use: No  . Sexual activity: Not on file  Other Topics Concern  . Not on file  Social History Narrative  .  Not on file   Social Determinants of Health   Financial Resource Strain:   . Difficulty of Paying Living Expenses: Not  on file  Food Insecurity:   . Worried About Charity fundraiser in the Last Year: Not on file  . Ran Out of Food in the Last Year: Not on file  Transportation Needs:   . Lack of Transportation (Medical): Not on file  . Lack of Transportation (Non-Medical): Not on file  Physical Activity:   . Days of Exercise per Week: Not on file  . Minutes of Exercise per Session: Not on file  Stress:   . Feeling of Stress : Not on file  Social Connections:   . Frequency of Communication with Friends and Family: Not on file  . Frequency of Social Gatherings with Friends and Family: Not on file  . Attends Religious Services: Not on file  . Active Member of Clubs or Organizations: Not on file  . Attends Archivist Meetings: Not on file  . Marital Status: Not on file     Family History:  The patient's family history includes Cancer in his brother, brother, and sister; Heart disease in his brother, sister, and sister; Heart failure in his mother; Stroke in his father and mother.   ROS:   Please see the history of present illness.    ROS All other systems are reviewed and are negative.   PHYSICAL EXAM:   VS:  BP 128/72   Pulse 74   Ht 5\' 11"  (1.803 m)   Wt 243 lb (110.2 kg)   SpO2 98%   BMI 33.89 kg/m      General: Alert, oriented x3, no distress, mildly obese, healthy with subclavian defibrillator site Head: no evidence of trauma, PERRL, EOMI, no exophtalmos or lid lag, no myxedema, no xanthelasma; normal ears, nose and oropharynx Neck: normal jugular venous pulsations and no hepatojugular reflux; brisk carotid pulses without delay and no carotid bruits Chest: clear to auscultation, no signs of consolidation by percussion or palpation, normal fremitus, symmetrical and full respiratory excursions Cardiovascular: normal position and quality of the apical impulse, regular rhythm, normal first and second heart sounds, no murmurs, rubs or gallops Abdomen: no tenderness or distention, no  masses by palpation, no abnormal pulsatility or arterial bruits, normal bowel sounds, no hepatosplenomegaly Extremities: no clubbing, cyanosis or edema; 2+ radial, ulnar and brachial pulses bilaterally; 2+ right femoral, posterior tibial and dorsalis pedis pulses; 2+ left femoral, posterior tibial and dorsalis pedis pulses; no subclavian or femoral bruits Neurological: grossly nonfocal Psych: Normal mood and affect   Wt Readings from Last 3 Encounters:  06/14/20 243 lb (110.2 kg)  03/19/20 238 lb (108 kg)  03/16/20 238 lb 9.6 oz (108.2 kg)     Studies/Labs Reviewed:   ECHO 2019:  - Left ventricle: The cavity size was normal. Wall thickness was  increased in a pattern of mild LVH. Systolic function was  moderately reduced. The estimated ejection fraction was in the  range of 35% to 40%. Diffuse hypokinesis. Doppler parameters are  consistent with a reversible restrictive pattern, indicative of  decreased left ventricular diastolic compliance and/or increased  left atrial pressure (grade 3 diastolic dysfunction).  - Aorta: Aortic root dimension: 39 mm (ED).  - Ascending aorta: The ascending aorta was mildly dilated.  - Mitral valve: There was mild regurgitation.  - Left atrium: The atrium was moderately dilated. Volume/bsa, ES  (1-plane Simpson&'s, A4C): 40 ml/m^2.  - Right ventricle: Pacer wire  or catheter noted in right ventricle.  - Right atrium: The atrium was mildly dilated. Pacer wire or  catheter noted in right atrium.  - Tricuspid valve: There was mild regurgitation.   EKG:  EKG is ordered today.  It shows atrial fibrillation with biventricular pacing and a prominent positive R wave in lead V1.  Recent Labs: 03/16/2020: ALT 13 03/19/2020: Hemoglobin 16.6; Platelets 144 03/23/2020: BUN 36; Creatinine, Ser 1.73; Potassium 5.7; Sodium 137   Lipid Panel    Component Value Date/Time   CHOL 166 03/16/2020 1010   TRIG 196.0 (H) 03/16/2020 1010   HDL 41.50  03/16/2020 1010   CHOLHDL 4 03/16/2020 1010   VLDL 39.2 03/16/2020 1010   LDLCALC 85 03/16/2020 1010   LDLDIRECT 91.0 11/20/2019 0959     ASSESSMENT:    1. Chronic systolic heart failure ([place])   2. CHB (complete heart block) (HCC)   3. Permanent atrial fibrillation ([place])   4. Longstanding persistent atrial fibrillation ([place])   5. 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 2018   6. Essential hypertension   7. PAD (peripheral artery disease) ([place])   8. Diabetes mellitus type 2 in obese ([place])   9. Dyslipidemia (high LDL; low HDL)      PLAN:  In order of problems listed above:  1. CHF: Remains euvolemic, NYHA functional class II without need for diuretic dose adjustment on maximum dose Entresto, metoprolol succinate, empagliflozin and eplerenone (switched from spironolactone due to gynecomastia).  OptiVol within range over the last 9 months. 2. CHB: Pacemaker dependent, advised him to avoid metal detectors with his upcoming travel. 3. AFib: Permanent arrhythmia.  Appropriately anticoagulated.  CHADSVasc 5 (age 89, diabetes, heart failure, hypertension).  No history of embolic events. 4. Xarelto: No bleeding complications. 5. CRT-D: Normal device function appropriate heart rate histogram distribution. 6. HTN: well controlled 7. PAD: Very mild abnormality in the left lower extremity ABI.  He is not troubled by any claudication. 8. DM/obesity/dyslipidemia: Slight improvement in hemoglobin A1c but remains clearly out of range.  Now on insulin.  Planning to see an endocrinologist.  Vania Rea should be continued.  Lipid parameters are very close to target range on current statin dose.    Medication Adjustments/Labs and Tests Ordered: Current medicines are reviewed at length with the patient today.  Concerns regarding medicines are outlined above.  Medication changes, Labs and Tests ordered today are listed in the Patient Instructions below. Patient  Instructions  Medication Instructions:  No changes *If you need a refill on your cardiac medications before your next appointment, please call your pharmacy*   Lab Work: None ordered If you have labs (blood work) drawn today and your tests are completely normal, you will receive your results only by: [name] MyChart Message (if you have MyChart) OR . A paper copy in the mail If you have any lab test that is abnormal or we need to change your treatment, we will call you to review the results.   Testing/Procedures: None ordered   Follow-Up: At [facility], you and your health needs are our priority.  As part of our continuing mission to provide you with exceptional heart care, we have created designated Provider Care Teams.  These Care Teams include your primary Cardiologist (physician) and Advanced Practice Providers (APPs -  Physician Assistants and Nurse Practitioners) who all work together to provide you with the care you need, when you need it.  We recommend signing up for the patient portal called "MyChart".  Sign up  information is provided on this After Visit Summary.  MyChart is used to connect with patients for Virtual Visits (Telemedicine).  Patients are able to view lab/test results, encounter notes, upcoming appointments, etc.  Non-urgent messages can be sent to your provider as well.   To learn more about what you can do with MyChart, go to [email or web address].    Your next appointment:   12 month(s)  The format for your next appointment:   In Person  Provider:   [name], MD      Signed, [name], MD  06/14/2020 10:28 AM    [address], [place], [place]  [address] Phone: [phone]; Fax: [phone]

## 2020-06-16 ENCOUNTER — Other Ambulatory Visit: Payer: Self-pay

## 2020-06-22 ENCOUNTER — Other Ambulatory Visit: Payer: Self-pay

## 2020-06-22 ENCOUNTER — Ambulatory Visit (INDEPENDENT_AMBULATORY_CARE_PROVIDER_SITE_OTHER): Payer: Medicare Other | Admitting: Family Medicine

## 2020-06-22 ENCOUNTER — Encounter: Payer: Self-pay | Admitting: Family Medicine

## 2020-06-22 VITALS — BP 92/60 | HR 86 | Temp 97.5°F | Resp 16 | Ht 71.0 in | Wt 240.4 lb

## 2020-06-22 DIAGNOSIS — I2510 Atherosclerotic heart disease of native coronary artery without angina pectoris: Secondary | ICD-10-CM | POA: Diagnosis not present

## 2020-06-22 DIAGNOSIS — E1149 Type 2 diabetes mellitus with other diabetic neurological complication: Secondary | ICD-10-CM | POA: Diagnosis not present

## 2020-06-22 DIAGNOSIS — I10 Essential (primary) hypertension: Secondary | ICD-10-CM

## 2020-06-22 DIAGNOSIS — N183 Chronic kidney disease, stage 3 unspecified: Secondary | ICD-10-CM

## 2020-06-22 DIAGNOSIS — Z7901 Long term (current) use of anticoagulants: Secondary | ICD-10-CM

## 2020-06-22 DIAGNOSIS — E7800 Pure hypercholesterolemia, unspecified: Secondary | ICD-10-CM | POA: Diagnosis not present

## 2020-06-22 DIAGNOSIS — I255 Ischemic cardiomyopathy: Secondary | ICD-10-CM

## 2020-06-22 DIAGNOSIS — I482 Chronic atrial fibrillation, unspecified: Secondary | ICD-10-CM

## 2020-06-22 DIAGNOSIS — N2889 Other specified disorders of kidney and ureter: Secondary | ICD-10-CM

## 2020-06-22 LAB — COMPREHENSIVE METABOLIC PANEL
ALT: 14 U/L (ref 0–53)
AST: 19 U/L (ref 0–37)
Albumin: 4.2 g/dL (ref 3.5–5.2)
Alkaline Phosphatase: 86 U/L (ref 39–117)
BUN: 28 mg/dL — ABNORMAL HIGH (ref 6–23)
CO2: 32 mEq/L (ref 19–32)
Calcium: 9.3 mg/dL (ref 8.4–10.5)
Chloride: 100 mEq/L (ref 96–112)
Creatinine, Ser: 1.57 mg/dL — ABNORMAL HIGH (ref 0.40–1.50)
GFR: 42.13 mL/min — ABNORMAL LOW (ref >60.00)
Glucose, Bld: 175 mg/dL — ABNORMAL HIGH (ref 70–99)
Potassium: 4.7 mEq/L (ref 3.5–5.1)
Sodium: 137 mEq/L (ref 135–145)
Total Bilirubin: 0.7 mg/dL (ref 0.2–1.2)
Total Protein: 7.4 g/dL (ref 6.0–8.3)

## 2020-06-22 LAB — CBC
HCT: 50.7 % (ref 39.0–52.0)
Hemoglobin: 16.8 g/dL (ref 13.0–17.0)
MCHC: 33.2 g/dL (ref 30.0–36.0)
MCV: 91.5 fl (ref 78.0–100.0)
Platelets: 141 10*3/uL — ABNORMAL LOW (ref 150.0–400.0)
RBC: 5.54 Mil/uL (ref 4.22–5.81)
RDW: 13.9 % (ref 11.5–15.5)
WBC: 5.3 10*3/uL (ref 4.0–10.5)

## 2020-06-22 LAB — LIPID PANEL
Cholesterol: 168 mg/dL (ref 0–200)
HDL: 38.8 mg/dL — ABNORMAL LOW (ref >39.00)
NonHDL: 129.08
Total CHOL/HDL Ratio: 4
Triglycerides: 215 mg/dL — ABNORMAL HIGH (ref 0.0–149.0)
VLDL: 43 mg/dL — ABNORMAL HIGH (ref 0.0–40.0)

## 2020-06-22 LAB — LDL CHOLESTEROL, DIRECT: Direct LDL: 103 mg/dL

## 2020-06-22 LAB — HEMOGLOBIN A1C: Hgb A1c MFr Bld: 9.7 % — ABNORMAL HIGH (ref 4.6–6.5)

## 2020-06-22 MED ORDER — INSULIN GLARGINE 100 UNIT/ML ~~LOC~~ SOLN
SUBCUTANEOUS | 1 refills | Status: DC
Start: 2020-06-22 — End: 2020-09-14

## 2020-06-22 NOTE — Progress Notes (Signed)
OFFICE VISIT  06/22/2020  CC:  Chief Complaint  Patient presents with  . Follow-up    RCI, pt is fasting    HPI:    Patient is a 79 y.o. Caucasian male who presents for 3 mo f/u DM 2, HTN, HLD, CRI III. He has nonischemic CM (syst and diast dysfxn) and has CRT-D, also chronic a-fib and complete heart block (pacer dependent),andconsistently has NYHA class II/III sx's. Most recent cardiology f/u 06/14/20: all stable, remains on max medical mgmt + CRT-D. A/P as of last visit: "1) DM 2, poor control. Noncompliant with diab diet. HbA1c and lytes/cr today.  2) HTN: The current medical regimen is effective;  continue present plan and medications. Lytes/cr today.  3) HLD: tolerating statin. FLP and hepatic panel today.  4) CRI III: hydrates well, avoids NSAIDs. Lytes/cr today. CMET, A1c, lipids today"  INTERIM HX: Doing ok. Getting a new pontoon boat today.  DM: last visit his a1c was 9.2% so I increased insulin to lantus 54U qd and novolog 16 U qAC. He inc lantus to 56 and has been doing the nov at 16 U qAC as instructed. Says he is still not eating diabetic diet with any consistency. Glucoses erratic per his usual, can't get him to give me numbers today. Feet: no burning or numbness  Compliant with bp and cholesterol meds w/out side effect. No home bp monitoring.   ROS: no fevers, no CP, no SOB, no wheezing, no cough, no dizziness, no HAs, no rashes, no melena/hematochezia.  No polyuria or polydipsia.  No myalgias or arthralgias.  No focal weakness, paresthesias, or tremors.  No acute vision or hearing abnormalities. No n/v/d or abd pain.  No palpitations.     Past Medical History:  Diagnosis Date  . AICD (automatic cardioverter/defibrillator) present 2018   MDT CRT-D.  Fatigue-->completely pacer dependent.  Pacer settings adjusted 12/2017 to allow more chronotrophc variance with ADL's//exertion.  . Arthritis    Pt is s/p eft reverse total shoulder arthroplasty.  .  Balanitis    chronic fungal  . BPH (benign prostatic hypertrophy) 06/2011   Irritative sx's; pt declined trial of anticholinergic per Urology records  . Chronic combined systolic and diastolic heart failure ([place]) 05/31/2012   Nonischemic:  EF 40-45%, LA mod-severe dilated, AFIB.   02/2016 EF 40%, diffuse hypokinesis, grade 2 DD.  Myoc perf imaging showed EF 32% 04/2016.  Pt upgraded to CRT-D 01/04/17.  [name] Chronic renal insufficiency, stage III (moderate) (HCC) 2015   CrCl about 60 ml/min  . Complete heart block (HCC)    Has dual chamber pacer.  . Depression   . DOE (dyspnea on exertion)    NYHA class II/III CHF  . Dyspnea 2021   with exertion, bending over  . Episodic low back pain 01/22/2013   w/intermittent radiculitis (12/2014 his neurologist referred him to pain mgmt for epidural steroid injection)  . Erectile dysfunction 2019   due to zoloft--urol rx'd viagra  . GERD (gastroesophageal reflux disease)   . H/O tilt table evaluation 11/02/05   negative  . Helicobacter pylori gastritis 01/2016  . History of adenomatous polyp of colon 10/12/11   Dr. [name] (3 right side of colon- tubular adenomas removed)  . History of cardiovascular stress test 05/28/12   no ischemia, EF 37%, imaging results are unchanged and within normal variance  . History of chronic prostatitis   . History of kidney stones   . History of vertigo    + Hx of posterior HA's.  Neuro (Dr. Erling Cruz) eval 2011.  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  . Hyperlipidemia   . Hypertension   . Lumbar spondylosis    lumbosacral radiculopathy at L4 by EMG testing, right foot drop (neurologist is Dr. Linus Salmons with [facility] in [place])--neurologist referred him to neurosurgery  . Migraine    "used to have them all the time; none for years" (01/04/2017)  . Myocardial infarction [facility]) ?1970s   not entirely certain of this  . Nephrolithiasis 07/2012    Left UVJ 2 mm stone with dilation of renal collecting system and slight hydroureter on right  . Neuropathy   . NICM (nonischemic cardiomyopathy) ([place])    a. 02/2018 Cath: LM nl, LAD min irregs, LCX no, RCA 20d. [id number]. Fick CO/CI 4.4/2.0.  . 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  Since no CAD on cath 05/2016, cards recommends upgrade to CRT-D.  [name] Permanent atrial fibrillation ([place])    DCCV 07/09/13-converted, lasted two days, then back into afib--needs lifetime anticoagulation (Xarelto as of 09/2014)  . Prostate cancer screening 09/2017   done by urol annually (normal prostate exam documented + PSA 0.84 as of 10/01/17 urol f/u.  10/2018 urol f/u PSA 0.6, no prostate nodule.  . Rectus diastasis   . Right ankle sprain 08/2017   w/distal fibula avulsion fx noted on u/s but not plain film-(Dr. Hudnall).  . Skin cancer of arm, left    "burned it off" (01/04/2017)  . TIA (transient ischemic attack)    L face and L arm weakness. Peri procedural->a. 03/22/2018 following cath. CT head neg. No MRI b/c has pacer. Likely due to embolus to distal branch of RMCA  . Type II diabetes mellitus ([place])     Past Surgical History:  Procedure Laterality Date  . ABI's Bilateral 05/21/2018   normal  . BACK SURGERY    . BIV ICD INSERTION CRT-D N/A 01/04/2017   Procedure: BiV ICD ;  Surgeon: Constance Haw, MD;  Location: [facility];  Service: Cardiovascular;  Laterality: N/A;  . CARDIAC CATHETERIZATION N/A 06/14/2016   Minimal nonobstructive dz, EF 25-35%.  Procedure: Left Heart Cath and Coronary Angiography;  Surgeon: Peter M Martinique, MD;  Location: [facility];  Service: Cardiovascular;  Laterality: N/A;  . CARDIOVASCULAR STRESS TEST  2012   2012 nuclear perfusion study: low risk scan; 04/2016 normal myocardial perfusion imaging, EF 32%.  [name] CARDIOVERSION  07/09/2012   Procedure: CARDIOVERSION;  Surgeon: [name], MD;   Location: [facility];  Service: Cardiovascular;  Laterality: N/A;  . CATARACT EXTRACTION W/ INTRAOCULAR LENS IMPLANT & ANTERIOR VITRECTOMY, BILATERAL Bilateral   . CIRCUMCISION N/A 03/23/2020   Procedure: CIRCUMCISION ADULT;  Surgeon: Remi Haggard, MD;  Location: [facility];  Service: Urology;  Laterality: N/A;  . COLONOSCOPY W/ POLYPECTOMY  approx 2006; repeated 09/2011   Polyps on 2013 EGD as well, repeat 12/2014  . ESOPHAGOGASTRODUODENOSCOPY  10/18/06   Done due to chronic GERD: Normal, bx showed no barrett's esophagus (Dr. [name])  . FLEXOR TENDON REPAIR Left 10/02/2016   Procedure: LEFT RING FINGER WOUND EXPORATION AND FLEXOR TENDON REPAIR AND NERVE REPAIR;  Surgeon: Milly Jakob, MD;  Location: [facility];  Service: Orthopedics;  Laterality: Left;  . INSERT / REPLACE / REMOVE PACEMAKER  02/05/2012   dual chamber, sinus node dysfunction, sinus arrest, PAF, Medtronic Revo serial#-[id number] H: last checked 05/2015  . LUMBAR LAMINECTOMY  Left 1976   L4-5  . PACEMAKER REMOVAL  01/04/2017  . PERMANENT PACEMAKER INSERTION N/A 02/05/2012   Procedure: PERMANENT PACEMAKER INSERTION;  Surgeon: [name], MD; Generator Medtronic Revo model [place] serial number [id number] H Laterality: N/A;  . RETINAL DETACHMENT SURGERY Left ~ 1999  . REVERSE SHOULDER ARTHROPLASTY Left 2018   Left shoulder reverse [name] in [place]).  [name] RIGHT/LEFT HEART CATH AND CORONARY ANGIOGRAPHY N/A 03/22/2018   EF 30-35%, no CAD.  Procedure: RIGHT/LEFT HEART CATH AND CORONARY ANGIOGRAPHY;  Surgeon: Jolaine Artist, MD;  Location: [facility];  Service: Cardiovascular;  Laterality: N/A;  . TRANSTHORACIC ECHOCARDIOGRAM  08/25/10; 05/2012; 03/23/16;12/2017   mild asymmetric LVH, normal systolic function, normal diastolic fxn, mild-to-mod mitral regurg, mild aortic valve sclerosis and trace AI, mild aortic root dilatation. 2014 f/u showed EF 40-45%, mod LAE, A FIB.  02/2016 EF 40%, diffuse hypokinesis, grade 2 DD.  12/2017 EF 35-40%,diffuse hypokin,grd III DD, mild MR  . WISDOM TOOTH EXTRACTION      Outpatient Medications Prior to Visit  Medication Sig Dispense Refill  . BD INSULIN SYRINGE U/F 31G X 5/16" 1 ML MISC USE DAILY AS DIRECTED 100 each 3  . COMBIGAN 0.2-0.5 % ophthalmic solution Place 1 drop into both eyes at bedtime.     [name] ENTRESTO 97-103 MG Take 1 tablet by mouth 2 (two) times daily. 180 tablet 2  . eplerenone (INSPRA) 25 MG tablet TAKE 1 TABLET DAILY (Patient taking differently: Take 25 mg by mouth daily. ) 90 tablet 3  . gabapentin (NEURONTIN) 300 MG capsule 2 caps po bid (Patient taking differently: Take 300 mg by mouth in the morning and at bedtime. ) 360 capsule 3  . glucose blood test strip Use to check blood sugars 1-2 times daily 100 each 3  . insulin aspart (NOVOLOG FLEXPEN) 100 UNIT/ML FlexPen 16 U SQ at the time of each meal 18 mL 6  . Insulin Pen Needle (PEN NEEDLES) 31G X 5 MM MISC USE TO INJECT INSULIN SQ. 100 each 2  . JARDIANCE 10 MG TABS tablet TAKE 1 TABLET DAILY 90 tablet 3  . Lancets (FREESTYLE) lancets USE AS INSTRUCTED 100 each 11  . latanoprost (XALATAN) 0.005 % ophthalmic solution Place 1 drop into both eyes at bedtime.   12  . meclizine (ANTIVERT) 25 MG tablet TAKE 1 TABLET THREE TIMES A DAY AS NEEDED FOR DIZZINESS 270 tablet 1  . metoprolol succinate (TOPROL-XL) 25 MG 24 hr tablet Take 1 tablet (25 mg total) by mouth daily. Take with or immediately following a meal. 90 tablet 1  . omeprazole (PRILOSEC) 40 MG capsule TAKE 1 CAPSULE DAILY (Patient taking differently: Take 40 mg by mouth daily. ) 90 capsule 1  . oxybutynin (DITROPAN-XL) 5 MG 24 hr tablet TAKE 1 TABLET AT BEDTIME 90 tablet 3  . rOPINIRole (REQUIP) 1 MG tablet TAKE 1 TABLET DAILY 90 tablet 3  . sertraline (ZOLOFT) 100 MG tablet Take 1 tablet (100 mg total) by mouth daily. 90 tablet 1  . simvastatin (ZOCOR) 20 MG tablet TAKE 1 TABLET EVERY EVENING (Patient taking differently: Take 20 mg by mouth every  evening. ) 90 tablet 3  . XARELTO 20 MG TABS tablet TAKE 1 TABLET DAILY WITH SUPPER (Patient taking differently: Take 20 mg by mouth daily with supper. ) 90 tablet 3  . insulin glargine (LANTUS) 100 UNIT/ML injection INJECT 54 UNITS UNDER THE SKIN DAILY. PATIENT WILL BE TITRATING HIS DOSE PERIODICALLY 54 mL 1  .  acetaminophen (TYLENOL) 325 MG tablet Take 650 mg by mouth every 6 (six) hours as needed for moderate pain. (Patient not taking: Reported on 06/22/2020)    . Continuous Blood Gluc Receiver (FREESTYLE LIBRE 2 READER) DEVI USE TO CHECK BLOOD SUGAR 1-2 TIMES DAILY. (Patient not taking: Reported on 06/22/2020) 1 each 3  . Continuous Blood Gluc Sensor (FREESTYLE LIBRE SENSOR SYSTEM) MISC USE TO CHECK BLOOD SUGARS 4 TIMES DAILY. Before breakfast, before lunch, before supper, and at bedtime (Patient not taking: Reported on 06/22/2020) 1 each 1  . oxyCODONE-acetaminophen (PERCOCET) 5-325 MG tablet Take 1 tablet by mouth every 4 (four) hours as needed for severe pain. (Patient not taking: Reported on 06/22/2020) 20 tablet 0  . triamcinolone (KENALOG) 0.1 % SMARTSIG:1 Application Topical 2-3 Times Daily (Patient not taking: Reported on 06/22/2020)     No facility-administered medications prior to visit.    No Known Allergies  ROS As per HPI  PE: Vitals with BMI 06/22/2020 06/14/2020 03/23/2020  Height 5\' 11"  5\' 11"  -  Weight 240 lbs 6 oz 243 lbs -  BMI 61.60 73.71 -  Systolic 92 062 694  Diastolic 60 72 74  Pulse 86 74 71   Gen: Alert, well appearing.  Patient is oriented to person, place, time, and situation. AFFECT: pleasant, lucid thought and speech. CV: RRR, no m/r/g.   LUNGS: CTA bilat, nonlabored resps, good aeration in all lung fields. EXT: no clubbing or cyanosis.  no edema.  Foot exam -no swelling, tenderness or skin or vascular lesions. Color and temperature is normal. Sensation is intact. Peripheral pulses are palpable. Toenails are mycotic.   LABS:  Lab Results  Component  Value Date   TSH 2.963 04/04/2018   Lab Results  Component Value Date   WBC 4.9 03/19/2020   HGB 16.6 03/19/2020   HCT 51.2 03/19/2020   MCV 93.6 03/19/2020   PLT 144 (L) 03/19/2020   Lab Results  Component Value Date   CREATININE 1.73 (H) 03/23/2020   BUN 36 (H) 03/23/2020   NA 137 03/23/2020   K 5.7 (H) 03/23/2020   CL 103 03/23/2020   CO2 25 03/23/2020   Lab Results  Component Value Date   ALT 13 03/16/2020   AST 17 03/16/2020   ALKPHOS 73 03/16/2020   BILITOT 0.8 03/16/2020   Lab Results  Component Value Date   CHOL 166 03/16/2020   Lab Results  Component Value Date   HDL 41.50 03/16/2020   Lab Results  Component Value Date   LDLCALC 85 03/16/2020   Lab Results  Component Value Date   TRIG 196.0 (H) 03/16/2020   Lab Results  Component Value Date   CHOLHDL 4 03/16/2020   Lab Results  Component Value Date   HGBA1C 9.0 (H) 03/23/2020   IMPRESSION AND PLAN:  1) DM 2, poor control. Noncompliant with diet.  Exercise is minimal. Compliant with meds.  Unfortunately he is vague with report of home gluc monitoring numbers. Current regimen of lantus 56 U qd and novolog 16 U qAC plus jardiance 10mg  qd. He expressed interest in seeing an endocrinologist today so I have made this referral. No treatment regimen changes at this time. Hba1c today. Feet exam today is remarkable only for onychomycosis.  2) Ischemic CM, chronic a-fib, complete HB: stable on max medical management + CRT.   Continue all current meds and check lytes/cr and cbc today.  3) HLD/ischemic heart dz: tolerating simva 20mg  qd. LDL was near goal 3 mo ago (  85). FLP and hepatic panel today.  Consider switch to atorva or rosuva.  4) CRI III: he avoids NSAIDs.   Hydrates fairly well. Lytes/cr today.  An After Visit Summary was printed and given to the patient.  FOLLOW UP: Return in about 3 months (around 09/20/2020) for routine chronic illness f/u.  Signed:  Crissie Sickles, MD            06/22/2020

## 2020-06-27 DIAGNOSIS — Z743 Need for continuous supervision: Secondary | ICD-10-CM | POA: Diagnosis not present

## 2020-06-27 DIAGNOSIS — T1490XA Injury, unspecified, initial encounter: Secondary | ICD-10-CM | POA: Diagnosis not present

## 2020-07-09 ENCOUNTER — Other Ambulatory Visit: Payer: Self-pay | Admitting: Family Medicine

## 2020-07-13 ENCOUNTER — Other Ambulatory Visit: Payer: Self-pay | Admitting: Family Medicine

## 2020-07-20 ENCOUNTER — Other Ambulatory Visit: Payer: Self-pay | Admitting: Cardiovascular Disease

## 2020-07-28 ENCOUNTER — Ambulatory Visit (INDEPENDENT_AMBULATORY_CARE_PROVIDER_SITE_OTHER): Payer: Medicare Other

## 2020-07-28 ENCOUNTER — Ambulatory Visit (INDEPENDENT_AMBULATORY_CARE_PROVIDER_SITE_OTHER): Payer: Medicare Other | Admitting: Internal Medicine

## 2020-07-28 ENCOUNTER — Telehealth: Payer: Self-pay | Admitting: Internal Medicine

## 2020-07-28 ENCOUNTER — Encounter: Payer: Self-pay | Admitting: Internal Medicine

## 2020-07-28 ENCOUNTER — Telehealth: Payer: Self-pay

## 2020-07-28 ENCOUNTER — Other Ambulatory Visit: Payer: Self-pay

## 2020-07-28 VITALS — BP 120/82 | HR 75 | Ht 71.0 in | Wt 244.4 lb

## 2020-07-28 DIAGNOSIS — E1159 Type 2 diabetes mellitus with other circulatory complications: Secondary | ICD-10-CM | POA: Diagnosis not present

## 2020-07-28 DIAGNOSIS — E1165 Type 2 diabetes mellitus with hyperglycemia: Secondary | ICD-10-CM | POA: Diagnosis not present

## 2020-07-28 DIAGNOSIS — I428 Other cardiomyopathies: Secondary | ICD-10-CM

## 2020-07-28 LAB — CUP PACEART REMOTE DEVICE CHECK
Battery Remaining Longevity: 36 mo
Battery Voltage: 2.96 V
Brady Statistic AP VP Percent: 0 %
Brady Statistic AP VS Percent: 0 %
Brady Statistic AS VP Percent: 0 %
Brady Statistic AS VS Percent: 0 %
Brady Statistic RA Percent Paced: 0 %
Brady Statistic RV Percent Paced: 99.95 %
Date Time Interrogation Session: 20220104033423
HighPow Impedance: 70 Ohm
Implantable Lead Implant Date: 20130715
Implantable Lead Implant Date: 20180614
Implantable Lead Implant Date: 20180614
Implantable Lead Location: 753858
Implantable Lead Location: 753859
Implantable Lead Location: 753860
Implantable Lead Model: 4598
Implantable Pulse Generator Implant Date: 20180614
Lead Channel Impedance Value: 189.525
Lead Channel Impedance Value: 193.707
Lead Channel Impedance Value: 205.114
Lead Channel Impedance Value: 216.848
Lead Channel Impedance Value: 222.34 Ohm
Lead Channel Impedance Value: 361 Ohm
Lead Channel Impedance Value: 361 Ohm
Lead Channel Impedance Value: 399 Ohm
Lead Channel Impedance Value: 418 Ohm
Lead Channel Impedance Value: 475 Ohm
Lead Channel Impedance Value: 513 Ohm
Lead Channel Impedance Value: 513 Ohm
Lead Channel Impedance Value: 532 Ohm
Lead Channel Impedance Value: 646 Ohm
Lead Channel Impedance Value: 665 Ohm
Lead Channel Impedance Value: 722 Ohm
Lead Channel Impedance Value: 760 Ohm
Lead Channel Impedance Value: 760 Ohm
Lead Channel Pacing Threshold Amplitude: 0.5 V
Lead Channel Pacing Threshold Amplitude: 0.625 V
Lead Channel Pacing Threshold Pulse Width: 0.4 ms
Lead Channel Pacing Threshold Pulse Width: 1 ms
Lead Channel Sensing Intrinsic Amplitude: 0.625 mV
Lead Channel Sensing Intrinsic Amplitude: 20.5 mV
Lead Channel Sensing Intrinsic Amplitude: 20.5 mV
Lead Channel Setting Pacing Amplitude: 1 V
Lead Channel Setting Pacing Amplitude: 2 V
Lead Channel Setting Pacing Pulse Width: 0.4 ms
Lead Channel Setting Pacing Pulse Width: 1 ms
Lead Channel Setting Sensing Sensitivity: 0.3 mV

## 2020-07-28 MED ORDER — DEXCOM G6 TRANSMITTER MISC: 1.0000 | 3 refills | Status: DC

## 2020-07-28 MED ORDER — TRULICITY 0.75 MG/0.5ML ~~LOC~~ SOAJ: SUBCUTANEOUS | 1 refills | Status: DC

## 2020-07-28 MED ORDER — DEXCOM G6 SENSOR MISC: 1.0000 | 3 refills | Status: AC

## 2020-07-28 MED ORDER — DEXCOM G6 RECEIVER DEVI: 1.0000 | Freq: Once | 0 refills | Status: AC

## 2020-07-28 NOTE — Progress Notes (Signed)
Patient ID: Mason [name], male   DOB: 1940-11-21, 80 y.o.   MRN: [id number]   This visit occurred during the SARS-CoV-2 public health emergency.  Safety protocols were in place, including screening questions prior to the visit, additional usage of staff PPE, and extensive cleaning of exam room while observing appropriate contact time as indicated for disinfecting solutions.   HPI: Mason [name] is a 80 y.o.-year-old male, referred by his PCP, Dr. Anitra Lauth, for management of DM2, dx in ~2011, insulin-dependent since 2019, uncontrolled, with long-term complications (CAD with ?h/o AMI, nonischemic CMP, CHF, Afib, cerebrovascular disease with h/o TIA, CKD stage III, peripheral neuropathy, ED). He is here with his wife who offers part of the hx, especially about his blood sugars, diet and medication doses.  Reviewed HbA1c: Lab Results  Component Value Date   HGBA1C 9.7 (H) 06/22/2020   HGBA1C 9.0 (H) 03/23/2020   HGBA1C 9.2 (H) 03/16/2020   HGBA1C 9.9 (H) 11/20/2019   HGBA1C 9.7 (H) 05/27/2019   HGBA1C 9.0 (H) 02/26/2019   HGBA1C 10.1 (H) 11/29/2018   HGBA1C 9.0 (A) 08/29/2018   HGBA1C 9.0 08/29/2018   HGBA1C 9.0 (A) 08/29/2018   HGBA1C 9.0 (A) 08/29/2018   HGBA1C 7.8 (A) 05/14/2018   HGBA1C 8.1 (H) 02/06/2018   HGBA1C 8.0 (H) 10/26/2017   HGBA1C 8.2 (H) 07/27/2017   HGBA1C 7.0 03/01/2017   HGBA1C 7.1 11/16/2016   HGBA1C 7.8 (H) 07/03/2016   HGBA1C 6.8 03/14/2016   HGBA1C 7.0 (H) 12/09/2015   HGBA1C 7.4 (H) 09/20/2015   HGBA1C 7.2 (H) 03/25/2015   HGBA1C 7.5 (H) 10/15/2014   HGBA1C 7.1 (H) 06/15/2014   HGBA1C 6.9 (H) 02/12/2014   HGBA1C 8.6 (H) 10/14/2013   HGBA1C 7.5 (H) 07/15/2013   HGBA1C 6.7 (H) 01/03/2013   Pt is on a regimen of: - Jardiance 10 mg before or after breakfast >> frequent urination and nocturia 4x a night - Lantus 58  units at bedtime - Novolog 18 units 2x a day, before or after meals He was on Metformin and Glipizide in the past.  Pt checks his sugars >4x a day   - he has a Libre 2 CGM - I reviewed the tracings on his phone, but he did not have access to email to give [place] permission to download them. - am: 120s-220s - 2h after b'fast: n/c - before lunch: 170-230 - 2h after lunch: 180-250 - before dinner: 110-180 - 2h after dinner and snack: 230-300 - bedtime: see above - nighttime: n/c Lowest sugar was: 45 yesterday - in the pm he has hypoglycemia awareness at 70.  Highest sugar was 400.  Glucometer: Freestyle  Pt's meals are: - Breakfast: cereals, eggs, grits, toast - Lunch: may skip or sandwich - Dinner: meat + veggies + starch - Snacks: apple/cheese Patient saw nutrition a long time ago.  -+ Stage III CKD, last BUN/creatinine:  Lab Results  Component Value Date   BUN 28 (H) 06/22/2020   BUN 36 (H) 03/23/2020   CREATININE 1.57 (H) 06/22/2020   CREATININE 1.73 (H) 03/23/2020  On Entresto.  -+ HL; last set of lipids: Lab Results  Component Value Date   CHOL 168 06/22/2020   HDL 38.80 (L) 06/22/2020   LDLCALC 85 03/16/2020   LDLDIRECT 103.0 06/22/2020   TRIG 215.0 (H) 06/22/2020   CHOLHDL 4 06/22/2020  On Zocor 20.  - last eye exam was on 04/03/2019: No DR. + glaucoma.  - + numbness and tingling in his feet.  Neurontin  300 mg x 2 twice a day.  No known FH of DM.  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  Drinks beer 2-3x a day, but not quite every day.  ROS: Constitutional: no weight gain, no weight loss, + fatigue, no subjective hyperthermia, no subjective hypothermia, + nocturia, + poor sleep Eyes: + blurry vision, no xerophthalmia ENT: no sore throat, no nodules palpated in neck, no dysphagia, no odynophagia, no hoarseness, + tinnitus, no hypoacusis Cardiovascular: no CP, + SOB, no palpitations, no leg swelling Respiratory: no cough, + SOB, no wheezing Gastrointestinal: no N, no V, no D, no C, no acid reflux Musculoskeletal: no  muscle, no joint aches Skin: no rash, + hair loss, + easy bruising + itching Neurological: no tremors, no numbness or tingling/no dizziness/no HAs Psychiatric: + Depression, + anxiety + Difficulty with erections  Past Medical History:  Diagnosis Date  . AICD (automatic cardioverter/defibrillator) present 2018   MDT CRT-D.  Fatigue-->completely pacer dependent.  Pacer settings adjusted 12/2017 to allow more chronotrophc variance with ADL's//exertion.  . Arthritis    Pt is s/p eft reverse total shoulder arthroplasty.  . Balanitis    chronic fungal  . BPH (benign prostatic hypertrophy) 06/2011   Irritative sx's; pt declined trial of anticholinergic per Urology records  . Chronic combined systolic and diastolic heart failure ([place]) 05/31/2012   Nonischemic:  EF 40-45%, LA mod-severe dilated, AFIB.   02/2016 EF 40%, diffuse hypokinesis, grade 2 DD.  Myoc perf imaging showed EF 32% 04/2016.  Pt upgraded to CRT-D 01/04/17.  [name] Chronic renal insufficiency, stage III (moderate) (HCC) 2015   CrCl about 60 ml/min  . Complete heart block (HCC)    Has dual chamber pacer.  . Depression   . DOE (dyspnea on exertion)    NYHA class II/III CHF  . Dyspnea 2021   with exertion, bending over  . Episodic low back pain 01/22/2013   w/intermittent radiculitis (12/2014 his neurologist referred him to pain mgmt for epidural steroid injection)  . Erectile dysfunction 2019   due to zoloft--urol rx'd viagra  . GERD (gastroesophageal reflux disease)   . H/O tilt table evaluation 11/02/05   negative  . Helicobacter pylori gastritis 01/2016  . History of adenomatous polyp of colon 10/12/11   Dr. [name] (3 right side of colon- tubular adenomas removed)  . History of cardiovascular stress test 05/28/12   no ischemia, EF 37%, imaging results are unchanged and within normal variance  . History of chronic prostatitis   . History of kidney stones   . History of vertigo    + Hx of posterior HA's.  Neuro (Dr. Erling Cruz) eval 2011.   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  . Hyperlipidemia   . Hypertension   . Lumbar spondylosis    lumbosacral radiculopathy at L4 by EMG testing, right foot drop (neurologist is Dr. Linus Salmons with [facility] in [place])--neurologist referred him to neurosurgery  . Migraine    "used to have them all the time; none for years" (01/04/2017)  . Myocardial infarction [facility]) ?1970s   not entirely certain of this  . Nephrolithiasis 07/2012   Left UVJ 2 mm stone with dilation of renal collecting system and slight hydroureter on right  . Neuropathy   . NICM (nonischemic cardiomyopathy) ([place])    a. 02/2018 Cath: LM nl, LAD min irregs, LCX no, RCA 20d. [id number].  Fick CO/CI 4.4/2.0.  . 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  Since no CAD on cath 05/2016, cards recommends upgrade to CRT-D.  [name] Permanent atrial fibrillation (HCC)    DCCV 07/09/13-converted, lasted two days, then back into afib--needs lifetime anticoagulation (Xarelto as of 09/2014)  . Prostate cancer screening 09/2017   done by urol annually (normal prostate exam documented + PSA 0.84 as of 10/01/17 urol f/u.  10/2018 urol f/u PSA 0.6, no prostate nodule.  . Rectus diastasis   . Right ankle sprain 08/2017   w/distal fibula avulsion fx noted on u/s but not plain film-(Dr. Hudnall).  . Skin cancer of arm, left    "burned it off" (01/04/2017)  . TIA (transient ischemic attack)    L face and L arm weakness. Peri procedural->a. 03/22/2018 following cath. CT head neg. No MRI b/c has pacer. Likely due to embolus to distal branch of RMCA  . Type II diabetes mellitus (HCC)    Past Surgical History:  Procedure Laterality Date  . ABI's Bilateral 05/21/2018   normal  . BACK SURGERY    . BIV ICD INSERTION CRT-D N/A 01/04/2017   Procedure: BiV ICD ;  Surgeon: Regan Lemming, MD;  Location: [facility] INVASIVE  CV LAB;  Service: Cardiovascular;  Laterality: N/A;  . CARDIAC CATHETERIZATION N/A 06/14/2016   Minimal nonobstructive dz, EF 25-35%.  Procedure: Left Heart Cath and Coronary Angiography;  Surgeon: Peter M [place]and, MD;  Location: [facility] INVASIVE CV LAB;  Service: Cardiovascular;  Laterality: N/A;  . CARDIOVASCULAR STRESS TEST  2012   2012 nuclear perfusion study: low risk scan; 04/2016 normal myocardial perfusion imaging, EF 32%.  [name] CARDIOVERSION  07/09/2012   Procedure: CARDIOVERSION;  Surgeon: Thurmon Fair, MD;  Location: [facility] ENDOSCOPY;  Service: Cardiovascular;  Laterality: N/A;  . CATARACT EXTRACTION W/ INTRAOCULAR LENS IMPLANT & ANTERIOR VITRECTOMY, BILATERAL Bilateral   . CIRCUMCISION N/A 03/23/2020   Procedure: CIRCUMCISION ADULT;  Surgeon: Belva Agee, MD;  Location: [facility] ORS;  Service: Urology;  Laterality: N/A;  . COLONOSCOPY W/ POLYPECTOMY  approx 2006; repeated 09/2011   Polyps on 2013 EGD as well, repeat 12/2014  . ESOPHAGOGASTRODUODENOSCOPY  10/18/06   Done due to chronic GERD: Normal, bx showed no barrett's esophagus (Dr. Elnoria Howard)  . FLEXOR TENDON REPAIR Left 10/02/2016   Procedure: LEFT RING FINGER WOUND EXPORATION AND FLEXOR TENDON REPAIR AND NERVE REPAIR;  Surgeon: Mack Hook, MD;  Location: [facility] OR;  Service: Orthopedics;  Laterality: Left;  . INSERT / REPLACE / REMOVE PACEMAKER  02/05/2012   dual chamber, sinus node dysfunction, sinus arrest, PAF, Medtronic Revo serial#-[id number] H: last checked 05/2015  . LUMBAR LAMINECTOMY Left 1976   L4-5  . PACEMAKER REMOVAL  01/04/2017  . PERMANENT PACEMAKER INSERTION N/A 02/05/2012   Procedure: PERMANENT PACEMAKER INSERTION;  Surgeon: Thurmon Fair, MD; Generator Medtronic Revo model [place] serial number [id number] H Laterality: N/A;  . RETINAL DETACHMENT SURGERY Left ~ 1999  . REVERSE SHOULDER ARTHROPLASTY Left 2018   Left shoulder reverse [facility] in [place]).  [name] RIGHT/LEFT HEART CATH AND CORONARY ANGIOGRAPHY N/A 03/22/2018    EF 30-35%, no CAD.  Procedure: RIGHT/LEFT HEART CATH AND CORONARY ANGIOGRAPHY;  Surgeon: Dolores Patty, MD;  Location: [facility] INVASIVE CV LAB;  Service: Cardiovascular;  Laterality: N/A;  . TRANSTHORACIC ECHOCARDIOGRAM  08/25/10; 05/2012; 03/23/16;12/2017   mild asymmetric LVH, normal systolic function, normal diastolic fxn, mild-to-mod mitral regurg, mild aortic valve sclerosis and trace AI, mild aortic  root dilatation. 2014 f/u showed EF 40-45%, mod LAE, A FIB.  02/2016 EF 40%, diffuse hypokinesis, grade 2 DD. 12/2017 EF 35-40%,diffuse hypokin,grd III DD, mild MR  . WISDOM TOOTH EXTRACTION     Social History   Socioeconomic History  . Marital status: Married    Spouse name: Not on file  . Number of children: Not on file  . Years of education: Not on file  . Highest education level: Not on file  Occupational History  . Not on file  Tobacco Use  . Smoking status: Never Smoker  . Smokeless tobacco: Never Used  Vaping Use  . Vaping Use: Never used  Substance and Sexual Activity  . Alcohol use: Yes    Comment: occ  . Drug use: No  . Sexual activity: Not on file  Other Topics Concern  . Not on file  Social History Narrative  . Not on file   Social Determinants of Health   Financial Resource Strain: Not on file  Food Insecurity: Not on file  Transportation Needs: Not on file  Physical Activity: Not on file  Stress: Not on file  Social Connections: Not on file  Intimate Partner Violence: Not on file   Current Outpatient Medications on File Prior to Visit  Medication Sig Dispense Refill  . acetaminophen (TYLENOL) 325 MG tablet Take 650 mg by mouth every 6 (six) hours as needed for moderate pain.    . BD INSULIN SYRINGE U/F 31G X 5/16" 1 ML MISC USE DAILY AS DIRECTED 100 each 3  . COMBIGAN 0.2-0.5 % ophthalmic solution Place 1 drop into both eyes at bedtime.     . Continuous Blood Gluc Receiver (FREESTYLE LIBRE 2 READER) DEVI USE TO CHECK BLOOD SUGAR 1-2 TIMES DAILY. 1 each 3  .  Continuous Blood Gluc Sensor (FREESTYLE LIBRE SENSOR SYSTEM) MISC USE TO CHECK BLOOD SUGARS 4 TIMES DAILY. Before breakfast, before lunch, before supper, and at bedtime 1 each 1  . ENTRESTO 97-103 MG TAKE 1 TABLET TWO TIMES A DAY 180 tablet 3  . eplerenone (INSPRA) 25 MG tablet TAKE 1 TABLET DAILY (Patient taking differently: Take 25 mg by mouth daily.) 90 tablet 3  . gabapentin (NEURONTIN) 300 MG capsule 2 caps po bid (Patient taking differently: Take 300 mg by mouth in the morning and at bedtime.) 360 capsule 3  . glucose blood test strip Use to check blood sugars 1-2 times daily 100 each 3  . insulin aspart (NOVOLOG FLEXPEN) 100 UNIT/ML FlexPen 16 U SQ at the time of each meal 18 mL 6  . insulin glargine (LANTUS) 100 UNIT/ML injection INJECT 56 UNITS UNDER THE SKIN DAILY. PATIENT WILL BE TITRATING HIS DOSE PERIODICALLY 50 mL 1  . Insulin Pen Needle (PEN NEEDLES) 31G X 5 MM MISC USE TO INJECT INSULIN SQ. 100 each 2  . JARDIANCE 10 MG TABS tablet TAKE 1 TABLET DAILY 90 tablet 3  . Lancets (FREESTYLE) lancets USE AS INSTRUCTED 100 each 11  . latanoprost (XALATAN) 0.005 % ophthalmic solution Place 1 drop into both eyes at bedtime.   12  . meclizine (ANTIVERT) 25 MG tablet TAKE 1 TABLET THREE TIMES A DAY AS NEEDED FOR DIZZINESS 270 tablet 1  . metoprolol succinate (TOPROL-XL) 25 MG 24 hr tablet Take 1 tablet (25 mg total) by mouth daily. Take with or immediately following a meal. 90 tablet 1  . omeprazole (PRILOSEC) 40 MG capsule Take 1 capsule (40 mg total) by mouth daily. 90 capsule 1  .  oxybutynin (DITROPAN-XL) 5 MG 24 hr tablet TAKE 1 TABLET AT BEDTIME 90 tablet 3  . oxyCODONE-acetaminophen (PERCOCET) 5-325 MG tablet Take 1 tablet by mouth every 4 (four) hours as needed for severe pain. 20 tablet 0  . rOPINIRole (REQUIP) 1 MG tablet TAKE 1 TABLET DAILY 90 tablet 3  . sertraline (ZOLOFT) 100 MG tablet TAKE 1 TABLET DAILY 90 tablet 0  . simvastatin (ZOCOR) 20 MG tablet TAKE 1 TABLET EVERY EVENING  (Patient taking differently: Take 20 mg by mouth every evening.) 90 tablet 3  . triamcinolone (KENALOG) 0.1 % SMARTSIG:1 Application Topical 2-3 Times Daily    . XARELTO 20 MG TABS tablet TAKE 1 TABLET DAILY WITH SUPPER (Patient taking differently: Take 20 mg by mouth daily with supper.) 90 tablet 3   No current facility-administered medications on file prior to visit.   No Known Allergies Family History  Problem Relation Age of Onset  . Heart failure Mother   . Stroke Mother   . Stroke Father   . Heart disease Sister   . Heart disease Sister   . Cancer Sister        liver  . Cancer Brother        lung  . Cancer Brother        lung  . Heart disease Brother    PE: BP 120/82   Pulse 75   Ht 5\' 11"  (1.803 m)   Wt 244 lb 6.4 oz (110.9 kg)   SpO2 99%   BMI 34.09 kg/m  Wt Readings from Last 3 Encounters:  07/28/20 244 lb 6.4 oz (110.9 kg)  06/22/20 240 lb 6.4 oz (109 kg)  06/14/20 243 lb (110.2 kg)   Constitutional: overweight, in NAD Eyes: PERRLA, EOMI, no exophthalmos ENT: moist mucous membranes, no thyromegaly, no cervical lymphadenopathy Cardiovascular: RRR, No MRG Respiratory: CTA B Gastrointestinal: abdomen soft, NT, ND, BS+ Musculoskeletal: no deformities, strength intact in all 4 Skin: moist, warm, no rashes Neurological: no tremor with outstretched hands, DTR normal in all 4  ASSESSMENT: 1. DM2, insulin-dependent, uncontrolled, with complications: - CAD with ?h/o AMI - nonischemic CMP - CHF, s/p AICD - Afib, s/p pacemaker - cerebrovascular disease with h/o TIA - CKD stage III - peripheral neuropathy - ED  No family history of medullary thyroid cancer or personal history of pancreatitis.  PLAN:  1. Patient with long-standing, uncontrolled diabetes, on oral antidiabetic regimen with SGLT2 inhibitor, also basal-bolus insulin regimen, which became insufficient.  Latest HbA1c was still quite high, at 9.7%. -He has had many complications from his diabetes and  CAD, A. fib, and CHF.  He is on [place] along with Xarelto. -He has a CGM but tells me that he would not be able to afford it in the new year.  I sent a Rx for Dexcom to [facility] mail order pharmacy, as Medicare prefers this CGM - at today's visit, we reviewed his CGM tracings on his phone (could not download the reports).  Sugars are extremely fluctuating, from the 40s, to 300s.  They are slightly low in the morning, but they increase after the first meal of the day only to plummet in the afternoon, before dinner.  After dinner, sugars are abruptly increasing up until approximately 12 AM, after which they started to improve. - Problems identified:   Upon questioning, he is not taking the rapid acting insulin 15 min before meals, but at variable times either before or after meals. I strongly advised him to try to  take the NovoLog 15 minutes before the meal.  When sugars are very high, he may need to even move this 30 minutes before the meal.  He is taking a fixed dose of Novolog and I advised her to try to be more flexible with the dose depending on the size and consistency of his meal and also the blood sugar before the meal.  Given specific examples about how to change the dose.  He is taking Jardiance sometimes during the morning, and I advised him to move this 15 to 30 minutes before breakfast  We also discussed about improving his meals, among other things about eliminating cereals and also eliminating eating between meals and his snack at night; I also advised him to beer to at most 2 a day and not every day; he also accepts a referral to nutrition -At today's visit I also suggested a GLP-1 receptor agonist, to help not only with diabetes but also to improve his cardiovascular and renal risk.  Discussed about benefits and possible side effects.  We will start at a low dose and at next visit we will increase the dose if tolerated well. - I suggested to:  Patient Instructions  Please  continue: - Jardiance 10 mg before breakfast - Lantus 58 units at bedtime  Change: - Novolog 12-20 units 3x a day, 15 min before meals  Please start: - Trulicity [id number] mg weekly, on Sunday am, for e.g.  Please return in 1.5 months.   - continue to check 4x a day, rotating checks - discussed about CBG targets for treatment: 80-130 mg/dL before meals and <180 mg/dL after meals; target HbA1c <7%. - given sugar log and advised how to fill it and to bring it at next appt  - given foot care handout and explained the principles  - given instructions for hypoglycemia management "15-15 rule"  - advised for yearly eye exams  - Return to clinic in 1.5 months   Philemon Kingdom, MD PhD [facility] Endocrinology

## 2020-07-28 NOTE — Telephone Encounter (Signed)
Please call to schedule appointment with you at [phone].

## 2020-07-28 NOTE — Patient Instructions (Addendum)
Please continue: - Jardiance 10 mg before breakfast - Lantus 58 units at bedtime  Change: - Novolog 12-20 units 3x a day, 15 min before meals  Please start: - Trulicity 0.75 mg weekly, on Sunday am, for e.g.  Please return in 1.5 months.   PATIENT INSTRUCTIONS FOR TYPE 2 DIABETES:  DIET AND EXERCISE Diet and exercise is an important part of diabetic treatment.  We recommended aerobic exercise in the form of brisk walking (working between 40-60% of maximal aerobic capacity, similar to brisk walking) for 150 minutes per week (such as 30 minutes five days per week) along with 3 times per week performing 'resistance' training (using various gauge rubber tubes with handles) 5-10 exercises involving the major muscle groups (upper body, lower body and core) performing 10-15 repetitions (or near fatigue) each exercise. Start at half the above goal but build slowly to reach the above goals. If limited by weight, joint pain, or disability, we recommend daily walking in a swimming pool with water up to waist to reduce pressure from joints while allow for adequate exercise.    BLOOD GLUCOSES Monitoring your blood glucoses is important for continued management of your diabetes. Please check your blood glucoses 2-4 times a day: fasting, before meals and at bedtime (you can rotate these measurements - e.g. one day check before the 3 meals, the next day check before 2 of the meals and before bedtime, etc.).   HYPOGLYCEMIA (low blood sugar) Hypoglycemia is usually a reaction to not eating, exercising, or taking too much insulin/ other diabetes drugs.  Symptoms include tremors, sweating, hunger, confusion, headache, etc. Treat IMMEDIATELY with 15 grams of Carbs: . 4 glucose tablets .  cup regular juice/soda . 2 tablespoons raisins . 4 teaspoons sugar . 1 tablespoon honey Recheck blood glucose in 15 mins and repeat above if still symptomatic/blood glucose <100.  RECOMMENDATIONS TO REDUCE YOUR RISK OF  DIABETIC COMPLICATIONS: * Take your prescribed MEDICATION(S) * Follow a DIABETIC diet: Complex carbs, fiber rich foods, (monounsaturated and polyunsaturated) fats * AVOID saturated/trans fats, high fat foods, >2,300 mg salt per day. * EXERCISE at least 5 times a week for 30 minutes or preferably daily.  * DO NOT SMOKE OR DRINK more than 1 drink a day. * Check your FEET every day. Do not wear tightfitting shoes. Contact us if you develop an ulcer * See your EYE doctor once a year or more if needed * Get a FLU shot once a year * Get a PNEUMONIA vaccine once before and once after age 79 years  GOALS:  * Your Hemoglobin A1c of <7%  * fasting sugars need to be <130 * after meals sugars need to be <180 (2h after you start eating) * Your Systolic BP should be 140 or lower  * Your Diastolic BP should be 80 or lower  * Your HDL (Good Cholesterol) should be 40 or higher  * Your LDL (Bad Cholesterol) should be 100 or lower. * Your Triglycerides should be 150 or lower  * Your Urine microalbumin (kidney function) should be <30 * Your Body Mass Index should be 25 or lower    Please consider the following ways to cut down carbs and fat and increase fiber and micronutrients in your diet: - substitute whole grain for white bread or pasta - substitute brown rice for white rice - substitute 90-calorie flat bread pieces for slices of bread when possible - substitute sweet potatoes or yams for white potatoes - substitute humus for margarine -  substitute tofu for cheese when possible - substitute almond or rice milk for regular milk (would not drink soy milk daily due to concern for soy estrogen influence on breast cancer risk) - substitute dark chocolate for other sweets when possible - substitute water - can add lemon or orange slices for taste - for diet sodas (artificial sweeteners will trick your body that you can eat sweets without getting calories and will lead you to overeating and weight gain in  the long run) - do not skip breakfast or other meals (this will slow down the metabolism and will result in more weight gain over time)  - can try smoothies made from fruit and almond/rice milk in am instead of regular breakfast - can also try old-fashioned (not instant) oatmeal made with almond/rice milk in am - order the dressing on the side when eating salad at a restaurant (pour less than half of the dressing on the salad) - eat as little meat as possible - can try juicing, but should not forget that juicing will get rid of the fiber, so would alternate with eating raw veg./fruits or drinking smoothies - use as little oil as possible, even when using olive oil - can dress a salad with a mix of balsamic vinegar and lemon juice, for e.g. - use agave nectar, stevia sugar, or regular sugar rather than artificial sweateners - steam or broil/roast veggies  - snack on veggies/fruit/nuts (unsalted, preferably) when possible, rather than processed foods - reduce or eliminate aspartame in diet (it is in diet sodas, chewing gum, etc) Read the labels!  Try to read Dr. Janene Harvey book: "Program for Reversing Diabetes" for other ideas for healthy eating.

## 2020-07-28 NOTE — Telephone Encounter (Signed)
I let the patient know we got his transmission automatically.

## 2020-08-02 NOTE — Telephone Encounter (Signed)
Message left to call me to schedule an appointment.  Telephone number given.

## 2020-08-03 ENCOUNTER — Other Ambulatory Visit: Payer: Self-pay | Admitting: Family Medicine

## 2020-08-03 NOTE — Telephone Encounter (Signed)
Patient requesting new blood glucose sensor for [facility] (not the 2). Please send to [facility] in [place].

## 2020-08-04 ENCOUNTER — Other Ambulatory Visit: Payer: Self-pay

## 2020-08-04 DIAGNOSIS — IMO0002 Reserved for concepts with insufficient information to code with codable children: Secondary | ICD-10-CM

## 2020-08-04 DIAGNOSIS — E1165 Type 2 diabetes mellitus with hyperglycemia: Secondary | ICD-10-CM

## 2020-08-04 MED ORDER — FREESTYLE LIBRE SENSOR SYSTEM MISC: 3 refills | Status: DC

## 2020-08-04 NOTE — Telephone Encounter (Signed)
Is this ok for me to order for pt

## 2020-08-04 NOTE — Telephone Encounter (Signed)
Sent in to pharmacy.  

## 2020-08-04 NOTE — Telephone Encounter (Signed)
Yes, thx

## 2020-08-10 ENCOUNTER — Telehealth: Payer: Self-pay | Admitting: Internal Medicine

## 2020-08-10 ENCOUNTER — Other Ambulatory Visit: Payer: Self-pay | Admitting: Internal Medicine

## 2020-08-10 MED ORDER — OZEMPIC (0.25 OR 0.5 MG/DOSE) 2 MG/1.5ML ~~LOC~~ SOPN: 0.2500 mg | PEN_INJECTOR | SUBCUTANEOUS | 3 refills | Status: DC

## 2020-08-10 NOTE — Telephone Encounter (Signed)
Pt called because Trulicity is not covered by his insurance and would like to know if there is an alternative that can be covered by his insurance.   Ph: [phone]

## 2020-08-10 NOTE — Telephone Encounter (Signed)
I sent a prescription for Ozempic.

## 2020-08-10 NOTE — Telephone Encounter (Signed)
Alternative  

## 2020-08-11 ENCOUNTER — Telehealth: Payer: Self-pay

## 2020-08-11 NOTE — Progress Notes (Signed)
Remote ICD transmission.   

## 2020-08-11 NOTE — Telephone Encounter (Signed)
Medical records requested. Faxed via Epic to [phone]

## 2020-08-11 NOTE — Telephone Encounter (Signed)
Outbound call to pt to advise prior auth was approved for Ozempic. [id number]; Status:Approved; Date:07/12/2020; Coverage End Date:07/23/2098

## 2020-08-12 ENCOUNTER — Telehealth: Payer: Self-pay

## 2020-08-12 NOTE — Telephone Encounter (Signed)
Received request for Physician order and medical records. Form completed and faxed to [phone].

## 2020-08-16 ENCOUNTER — Telehealth: Payer: Self-pay

## 2020-08-16 MED ORDER — FREESTYLE LIBRE 14 DAY SENSOR MISC: 3 refills | Status: DC

## 2020-08-16 NOTE — Telephone Encounter (Signed)
Received detailed written CGM order for freestyle libre 14 day reader, Placed on PCP desk to review and sign, if appropriate.

## 2020-08-16 NOTE — Telephone Encounter (Signed)
Information attached to 08/02/20 phone note for freestyle Graham reader

## 2020-08-17 ENCOUNTER — Telehealth: Payer: Self-pay | Admitting: Family Medicine

## 2020-08-17 NOTE — Telephone Encounter (Signed)
Received fax request from [facility], asking for "Chart notes corresponding patient's insulin regimen and medical reason for recommendation of CGM over standard glucose monitor."  Verified with patient this is ok to send. Patient states he was only given a 14 day monitor at [facility]. [facility] is attempting to get him the continuous monitor at no charge.  Placed new request along with previously filled out form in Dr. Idelle Leech inbox in front office.

## 2020-08-18 NOTE — Telephone Encounter (Signed)
Form completed and faxed along with last o/v notes from 05/2020. Fax confirmation received. All forms placed in bin for scan

## 2020-08-23 ENCOUNTER — Other Ambulatory Visit: Payer: Self-pay | Admitting: Family Medicine

## 2020-08-23 DIAGNOSIS — M6208 Separation of muscle (nontraumatic), other site: Secondary | ICD-10-CM | POA: Diagnosis not present

## 2020-08-23 DIAGNOSIS — R1013 Epigastric pain: Secondary | ICD-10-CM | POA: Diagnosis not present

## 2020-08-23 DIAGNOSIS — K219 Gastro-esophageal reflux disease without esophagitis: Secondary | ICD-10-CM | POA: Diagnosis not present

## 2020-08-24 NOTE — Progress Notes (Signed)
Subjective:   Mason [name] is a 80 y.o. male who presents for Medicare Annual/Subsequent preventive examination.  Review of Systems     Cardiac Risk Factors include: advanced age (>22men, >56 women);male gender;diabetes mellitus;hypertension;dyslipidemia;obesity (BMI >30kg/m2)     Objective:    Today's Vitals   08/25/20 0929  BP: 118/80  Pulse: 92  Resp: 16  Temp: 97.9 F (36.6 C)  TempSrc: Oral  SpO2: 97%  Weight: 243 lb (110.2 kg)  Height: 5\' 11"  (1.803 m)   Body mass index is 33.89 kg/m.  Advanced Directives 08/25/2020 03/19/2020 08/22/2019 07/30/2018 03/22/2018 03/18/2018 09/06/2017  Does Patient Have a Medical Advance Directive? No No No Yes No No Yes  Type of Advance Directive - - - Healthcare Power of Attorney - - Healthcare Power of Attorney  Does patient want to make changes to medical advance directive? Yes (MAU/Ambulatory/Procedural Areas - Information given) - - Yes (MAU/Ambulatory/Procedural Areas - Information given) - - -  Copy of Healthcare Power of Attorney in Chart? - - - No - copy requested - - -  Would patient like information on creating a medical advance directive? - No - Patient declined - - No - Patient declined No - Patient declined -  Pre-existing out of facility DNR order (yellow form or pink MOST form) - - - - - - -    Current Medications (verified) Outpatient Encounter Medications as of 08/25/2020  Medication Sig  . acetaminophen (TYLENOL) 325 MG tablet Take 650 mg by mouth every 6 (six) hours as needed for moderate pain.  . BD INSULIN SYRINGE U/F 31G X 5/16" 1 ML MISC USE DAILY AS DIRECTED  . COMBIGAN 0.2-0.5 % ophthalmic solution Place 1 drop into both eyes at bedtime.   . Continuous Blood Gluc Sensor (FREESTYLE LIBRE 14 DAY SENSOR) MISC Check glucose 4 times per day  . Continuous Blood Gluc Sensor (FREESTYLE LIBRE SENSOR SYSTEM) MISC USE TO CHECK BLOOD SUGARS 4 TIMES DAILY. Before breakfast, before lunch, before supper, and at bedtime  . ENTRESTO  97-103 MG TAKE 1 TABLET TWO TIMES A DAY  . eplerenone (INSPRA) 25 MG tablet TAKE 1 TABLET DAILY (Patient taking differently: Take 25 mg by mouth daily.)  . gabapentin (NEURONTIN) 300 MG capsule 2 caps po bid (Patient taking differently: Take 300 mg by mouth in the morning and at bedtime.)  . glucose blood test strip Use to check blood sugars 1-2 times daily  . insulin aspart (NOVOLOG FLEXPEN) 100 UNIT/ML FlexPen 16 U SQ at the time of each meal  . insulin glargine (LANTUS) 100 UNIT/ML injection INJECT 56 UNITS UNDER THE SKIN DAILY. PATIENT WILL BE TITRATING HIS DOSE PERIODICALLY  . Insulin Pen Needle (PEN NEEDLES) 31G X 5 MM MISC USE TO INJECT INSULIN SQ.  [name] JARDIANCE 10 MG TABS tablet TAKE 1 TABLET DAILY  . Lancets (FREESTYLE) lancets USE AS INSTRUCTED  . latanoprost (XALATAN) 0.005 % ophthalmic solution Place 1 drop into both eyes at bedtime.   . meclizine (ANTIVERT) 25 MG tablet TAKE 1 TABLET THREE TIMES A DAY AS NEEDED FOR DIZZINESS  . metoprolol succinate (TOPROL-XL) 25 MG 24 hr tablet Take 1 tablet (25 mg total) by mouth daily. Take with or immediately following a meal.  . metoprolol succinate (TOPROL-XL) 50 MG 24 hr tablet TAKE 1 TABLET DAILY. TAKE WITH OR IMMEDIATELY FOLLOWING A MEAL  . omeprazole (PRILOSEC) 40 MG capsule Take 1 capsule (40 mg total) by mouth daily.  [name] oxybutynin (DITROPAN-XL) 5 MG 24 hr  tablet TAKE 1 TABLET AT BEDTIME  . oxyCODONE-acetaminophen (PERCOCET) 5-325 MG tablet Take 1 tablet by mouth every 4 (four) hours as needed for severe pain.  [name] rOPINIRole (REQUIP) 1 MG tablet TAKE 1 TABLET DAILY  . sertraline (ZOLOFT) 100 MG tablet TAKE 1 TABLET DAILY  . simvastatin (ZOCOR) 20 MG tablet TAKE 1 TABLET EVERY EVENING (Patient taking differently: Take 20 mg by mouth every evening.)  . triamcinolone (KENALOG) 0.1 % SMARTSIG:1 Application Topical 2-3 Times Daily  . XARELTO 20 MG TABS tablet TAKE 1 TABLET DAILY WITH SUPPER (Patient taking differently: Take 20 mg by mouth daily  with supper.)  . Continuous Blood Gluc Receiver (FREESTYLE LIBRE 2 READER) DEVI USE TO CHECK BLOOD SUGAR 1-2 TIMES DAILY.  [name] Continuous Blood Gluc Transmit (DEXCOM G6 TRANSMITTER) MISC 1 Device by Does not apply route every 3 (three) months.  . TRULICITY 0.75 MG/0.5ML SOPN ADMINISTER 0.75 MG UNDER THE SKIN IN THE MORNING ONCE WEEKLY  . [DISCONTINUED] Semaglutide,0.25 or 0.5MG /DOS, (OZEMPIC, 0.25 OR 0.5 MG/DOSE,) 2 MG/1.5ML SOPN Inject 0.25-0.5 mg into the skin once a week. Inject 0.25 mg into the skin weekly for 4 weeks and then increase to 0.5 mg weekly   No facility-administered encounter medications on file as of 08/25/2020.    Allergies (verified) Patient has no known allergies.   History: Past Medical History:  Diagnosis Date  . AICD (automatic cardioverter/defibrillator) present 2018   MDT CRT-D.  Fatigue-->completely pacer dependent.  Pacer settings adjusted 12/2017 to allow more chronotrophc variance with ADL's//exertion.  . Arthritis    Pt is s/p eft reverse total shoulder arthroplasty.  . Balanitis    chronic fungal  . BPH (benign prostatic hypertrophy) 06/2011   Irritative sx's; pt declined trial of anticholinergic per Urology records  . Chronic combined systolic and diastolic heart failure (HCC) 05/31/2012   Nonischemic:  EF 40-45%, LA mod-severe dilated, AFIB.   02/2016 EF 40%, diffuse hypokinesis, grade 2 DD.  Myoc perf imaging showed EF 32% 04/2016.  Pt upgraded to CRT-D 01/04/17.  [name] Chronic renal insufficiency, stage III (moderate) (HCC) 2015   CrCl about 60 ml/min  . Complete heart block (HCC)    Has dual chamber pacer.  . Depression   . DOE (dyspnea on exertion)    NYHA class II/III CHF  . Dyspnea 2021   with exertion, bending over  . Episodic low back pain 01/22/2013   w/intermittent radiculitis (12/2014 his neurologist referred him to pain mgmt for epidural steroid injection)  . Erectile dysfunction 2019   due to zoloft--urol rx'd viagra  . GERD (gastroesophageal  reflux disease)   . H/O tilt table evaluation 11/02/05   negative  . Helicobacter pylori gastritis 01/2016  . History of adenomatous polyp of colon 10/12/11   Dr. Elnoria Howard (3 right side of colon- tubular adenomas removed)  . History of cardiovascular stress test 05/28/12   no ischemia, EF 37%, imaging results are unchanged and within normal variance  . History of chronic prostatitis   . History of kidney stones   . History of vertigo    + Hx of posterior HA's.  Neuro (Dr. Sandria Manly) eval 2011.  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  . Hyperlipidemia   . Hypertension   . Lumbar spondylosis    lumbosacral radiculopathy at L4 by EMG testing, right foot drop (neurologist is Dr. Luberta Robertson with [facility] in [place])--neurologist referred him to neurosurgery  . Migraine    "used  to have them all the time; none for years" (01/04/2017)  . Myocardial infarction [facility]) ?1970s   not entirely certain of this  . Nephrolithiasis 07/2012   Left UVJ 2 mm stone with dilation of renal collecting system and slight hydroureter on right  . Neuropathy   . NICM (nonischemic cardiomyopathy) (HCC)    a. 02/2018 Cath: LM nl, LAD min irregs, LCX no, RCA 20d. GNFA21. Fick CO/CI 4.4/2.0.  . 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  Since no CAD on cath 05/2016, cards recommends upgrade to CRT-D.  [name] Permanent atrial fibrillation (HCC)    DCCV 07/09/13-converted, lasted two days, then back into afib--needs lifetime anticoagulation (Xarelto as of 09/2014)  . Prostate cancer screening 09/2017   done by urol annually (normal prostate exam documented + PSA 0.84 as of 10/01/17 urol f/u.  10/2018 urol f/u PSA 0.6, no prostate nodule.  . Rectus diastasis   . Right ankle sprain 08/2017   w/distal fibula avulsion fx noted on u/s but not plain film-(Dr. Hudnall).  . Skin cancer of arm, left     "burned it off" (01/04/2017)  . TIA (transient ischemic attack)    L face and L arm weakness. Peri procedural->a. 03/22/2018 following cath. CT head neg. No MRI b/c has pacer. Likely due to embolus to distal branch of RMCA  . Type II diabetes mellitus (HCC)    Past Surgical History:  Procedure Laterality Date  . ABI's Bilateral 05/21/2018   normal  . BACK SURGERY    . BIV ICD INSERTION CRT-D N/A 01/04/2017   Procedure: BiV ICD ;  Surgeon: Regan Lemming, MD;  Location: [facility] INVASIVE CV LAB;  Service: Cardiovascular;  Laterality: N/A;  . CARDIAC CATHETERIZATION N/A 06/14/2016   Minimal nonobstructive dz, EF 25-35%.  Procedure: Left Heart Cath and Coronary Angiography;  Surgeon: Peter M Swaziland, MD;  Location: [facility] INVASIVE CV LAB;  Service: Cardiovascular;  Laterality: N/A;  . CARDIOVASCULAR STRESS TEST  2012   2012 nuclear perfusion study: low risk scan; 04/2016 normal myocardial perfusion imaging, EF 32%.  [name] CARDIOVERSION  07/09/2012   Procedure: CARDIOVERSION;  Surgeon: Thurmon Fair, MD;  Location: [facility] ENDOSCOPY;  Service: Cardiovascular;  Laterality: N/A;  . CATARACT EXTRACTION W/ INTRAOCULAR LENS IMPLANT & ANTERIOR VITRECTOMY, BILATERAL Bilateral   . CIRCUMCISION N/A 03/23/2020   Procedure: CIRCUMCISION ADULT;  Surgeon: Belva Agee, MD;  Location: [facility] ORS;  Service: Urology;  Laterality: N/A;  . COLONOSCOPY W/ POLYPECTOMY  approx 2006; repeated 09/2011   Polyps on 2013 EGD as well, repeat 12/2014  . ESOPHAGOGASTRODUODENOSCOPY  10/18/06   Done due to chronic GERD: Normal, bx showed no barrett's esophagus (Dr. Elnoria Howard)  . FLEXOR TENDON REPAIR Left 10/02/2016   Procedure: LEFT RING FINGER WOUND EXPORATION AND FLEXOR TENDON REPAIR AND NERVE REPAIR;  Surgeon: Mack Hook, MD;  Location: [facility] OR;  Service: Orthopedics;  Laterality: Left;  . INSERT / REPLACE / REMOVE PACEMAKER  02/05/2012   dual chamber, sinus node dysfunction, sinus arrest, PAF, Medtronic Revo serial#-[id number] H: last checked  05/2015  . LUMBAR LAMINECTOMY Left 1976   L4-5  . PACEMAKER REMOVAL  01/04/2017  . PERMANENT PACEMAKER INSERTION N/A 02/05/2012   Procedure: PERMANENT PACEMAKER INSERTION;  Surgeon: Thurmon Fair, MD; Generator Medtronic Revo model [place] serial number [id number] H Laterality: N/A;  . RETINAL DETACHMENT SURGERY Left ~ 1999  . REVERSE SHOULDER ARTHROPLASTY Left 2018   Left shoulder reverse [facility] [place]).  [name]  RIGHT/LEFT HEART CATH AND CORONARY ANGIOGRAPHY N/A 03/22/2018   EF 30-35%, no CAD.  Procedure: RIGHT/LEFT HEART CATH AND CORONARY ANGIOGRAPHY;  Surgeon: Dolores Patty, MD;  Location: [facility] INVASIVE CV LAB;  Service: Cardiovascular;  Laterality: N/A;  . TRANSTHORACIC ECHOCARDIOGRAM  08/25/10; 05/2012; 03/23/16;12/2017   mild asymmetric LVH, normal systolic function, normal diastolic fxn, mild-to-mod mitral regurg, mild aortic valve sclerosis and trace AI, mild aortic root dilatation. 2014 f/u showed EF 40-45%, mod LAE, A FIB.  02/2016 EF 40%, diffuse hypokinesis, grade 2 DD. 12/2017 EF 35-40%,diffuse hypokin,grd III DD, mild MR  . WISDOM TOOTH EXTRACTION     Family History  Problem Relation Age of Onset  . Heart failure Mother   . Stroke Mother   . Stroke Father   . Heart disease Sister   . Heart disease Sister   . Cancer Sister        liver  . Cancer Brother        lung  . Cancer Brother        lung  . Heart disease Brother    Social History   Socioeconomic History  . Marital status: Married    Spouse name: Not on file  . Number of children: Not on file  . Years of education: Not on file  . Highest education level: Not on file  Occupational History  . Not on file  Tobacco Use  . Smoking status: Never Smoker  . Smokeless tobacco: Never Used  Vaping Use  . Vaping Use: Never used  Substance and Sexual Activity  . Alcohol use: Yes    Comment: occ  . Drug use: No  . Sexual activity: Not on file  Other Topics Concern  . Not on file  Social History  Narrative  . Not on file   Social Determinants of Health   Financial Resource Strain: Low Risk   . Difficulty of Paying Living Expenses: Not hard at all  Food Insecurity: No Food Insecurity  . Worried About Programme researcher, broadcasting/film/video in the Last Year: Never true  . Ran Out of Food in the Last Year: Never true  Transportation Needs: No Transportation Needs  . Lack of Transportation (Medical): No  . Lack of Transportation (Non-Medical): No  Physical Activity: Sufficiently Active  . Days of Exercise per Week: 7 days  . Minutes of Exercise per Session: 30 min  Stress: No Stress Concern Present  . Feeling of Stress : Not at all  Social Connections: Moderately Integrated  . Frequency of Communication with Friends and Family: More than three times a week  . Frequency of Social Gatherings with Friends and Family: More than three times a week  . Attends Religious Services: Never  . Active Member of Clubs or Organizations: Yes  . Attends Banker Meetings: More than 4 times per year  . Marital Status: Married    Tobacco Counseling Counseling given: Not Answered   Clinical Intake:  Pre-visit preparation completed: Yes  Pain : No/denies pain     Nutritional Status: BMI > 30  Obese Nutritional Risks: None Diabetes: Yes CBG done?: No Did pt. bring in CBG monitor from home?: No  How often do you need to have someone help you when you read instructions, pamphlets, or other written materials from your doctor or pharmacy?: 1 - Never  Diabetes:  Is the patient diabetic?  Yes  If diabetic, was a CBG obtained today?  No  Did the patient bring in their glucometer from  home?  No  How often do you monitor your CBG's? 5-6 x per day.   Financial Strains and Diabetes Management:  Are you having any financial strains with the device, your supplies or your medication? No .  Does the patient want to be seen by Chronic Care Management for management of their diabetes?  No  Would the  patient like to be referred to a Nutritionist or for Diabetic Management?  No   Diabetic Exams:  Diabetic Eye Exam: . Overdue for diabetic eye exam. Pt has been advised about the importance in completing this exam. Patient has an upcoming appt scheduled in March.  Diabetic Foot Exam: Completed 06/22/2020.    Interpreter Needed?: No  Information entered by :: Thomasenia Sales LPN   Activities of Daily Living In your present state of health, do you have any difficulty performing the following activities: 08/25/2020 03/19/2020  Hearing? N Y  Comment - some hearing loss  Vision? N N  Difficulty concentrating or making decisions? Y N  Comment occasionally -  Walking or climbing stairs? N N  Dressing or bathing? N N  Doing errands, shopping? N N  Preparing Food and eating ? N -  Using the Toilet? N -  In the past six months, have you accidently leaked urine? Y -  Comment occasionally-sees urology -  Do you have problems with loss of bowel control? N -  Managing your Medications? N -  Managing your Finances? N -  Housekeeping or managing your Housekeeping? N -  Some recent data might be hidden    Patient Care Team: Jeoffrey Massed, MD as PCP - General (Family Medicine) Regan Lemming, MD as PCP - Electrophysiology (Cardiology) Croitoru, Rachelle Hora, MD as PCP - Cardiology (Cardiology) Jeani Hawking, MD as Consulting Physician (Gastroenterology) Ihor Gully, MD (Inactive) as Consulting Physician (Urology) Lennette Bihari, MD as Consulting Physician (Cardiology) Bartholomew Crews, MD as Referring Physician (Neurology) Ernesto Rutherford, MD as Consulting Physician (Ophthalmology) Windy Kalata, DO as Consulting Physician (Orthopedic Surgery) Luellen Pucker (Dentistry) Vedia Coffer Vaughan Sine, PA-C as Consulting Physician (Physician Assistant)  Indicate any recent Medical Services you may have received from other than [facility] providers in the past year (date may be approximate).      Assessment:   This is a routine wellness examination for Shaydon.  Hearing/Vision screen  Hearing Screening   125Hz  250Hz  500Hz  1000Hz  2000Hz  3000Hz  4000Hz  6000Hz  8000Hz   Right ear:           Left ear:           Comments: No issues  Vision Screening Comments: Reading glasses Last eye exam-2021-Dr Groat  Dietary issues and exercise activities discussed: Current Exercise Habits: Home exercise routine, Type of exercise: walking, Time (Minutes): 30, Frequency (Times/Week): 7, Weekly Exercise (Minutes/Week): 210, Intensity: Mild, Exercise limited by: None identified  Goals    . Weight (lb) < 210 lb (95.3 kg)     Lose weight by eating less.      Depression Screen PHQ 2/9 Scores 08/25/2020 02/26/2019 07/30/2018 04/24/2018 10/26/2017 07/27/2017 06/26/2016  PHQ - 2 Score 1 0 3 0 3 3 0  PHQ- 9 Score - 4 12 0 17 13 -    Fall Risk Fall Risk  08/25/2020 07/30/2018 07/27/2017 06/26/2016 04/26/2016  Falls in the past year? 1 1 Yes Yes No  Comment - dizzy, fell at shed; "chicken work" - - -  Number falls in past yr: 0 1 1 2  or more -  Injury with Fall? 0 0 No No -  Risk for fall due to : - Impaired balance/gait - - -  Follow up Falls prevention discussed Falls prevention discussed Falls prevention discussed Falls prevention discussed -    FALL RISK PREVENTION PERTAINING TO THE HOME:  Any stairs in or around the home? No  Home free of loose throw rugs in walkways, pet beds, electrical cords, etc? Yes  Adequate lighting in your home to reduce risk of falls? Yes   ASSISTIVE DEVICES UTILIZED TO PREVENT FALLS:  Life alert? No  Use of a cane, walker or w/c? No  Grab bars in the bathroom? Yes  Shower chair or bench in shower? No  Elevated toilet seat or a handicapped toilet? No   TIMED UP AND GO:  Was the test performed? Yes .  Length of time to ambulate 10 feet: 9 sec.   Gait steady and fast without use of assistive device  Cognitive Function:Normal cognitive status assessed by direct observation by  this Nurse Health Advisor. No abnormalities found.   MMSE - Mini Mental State Exam 07/30/2018  Orientation to time 5  Orientation to Place 5  Registration 3  Attention/ Calculation 3  Recall 2  Language- name 2 objects 2  Language- repeat 1  Language- follow 3 step command 3  Language- read & follow direction 1  Write a sentence 1  Copy design 1  Total score 27        Immunizations Immunization History  Administered Date(s) Administered  . Fluad Quad(high Dose 65+) 05/19/2019  . Influenza, High Dose Seasonal PF 05/26/2013, 05/20/2015, 06/26/2016, 05/11/2017, 04/24/2018, 05/22/2020  . Influenza,inj,Quad PF,6+ Mos 06/15/2014  . Moderna Sars-Covid-2 Vaccination 08/26/2019, 09/16/2019, 06/23/2020  . Pneumococcal Conjugate-13 10/15/2014  . Pneumococcal Polysaccharide-23 07/24/2010  . Tdap 08/30/2011    TDAP status: Up to date  Flu Vaccine status: Up to date  Pneumococcal vaccine status: Up to date  Covid-19 vaccine status: Completed vaccines  Qualifies for Shingles Vaccine? Yes   Zostavax completed No   Shingrix Completed?: No.    Education has been provided regarding the importance of this vaccine. Patient has been advised to call insurance company to determine out of pocket expense if they have not yet received this vaccine. Advised may also receive vaccine at local pharmacy or Health Dept. Verbalized acceptance and understanding.  Screening Tests Health Maintenance  Topic Date Due  . Hepatitis C Screening  Never done  . OPHTHALMOLOGY EXAM  04/02/2020  . HEMOGLOBIN A1C  12/20/2020  . COVID-19 Vaccine (4 - Booster) 12/22/2020  . FOOT EXAM  06/22/2021  . TETANUS/TDAP  08/29/2021  . INFLUENZA VACCINE  Completed  . PNA vac Low Risk Adult  Completed    Health Maintenance  Health Maintenance Due  Topic Date Due  . Hepatitis C Screening  Never done  . OPHTHALMOLOGY EXAM  04/02/2020    Colorectal cancer screening: No longer required.   Lung Cancer Screening: (Low  Dose CT Chest recommended if Age 51-80 years, 30 pack-year currently smoking OR have quit w/in 15years.) does not qualify.     Additional Screening:  Hepatitis C Screening: does not qualify  Vision Screening: Recommended annual ophthalmology exams for early detection of glaucoma and other disorders of the eye. Is the patient up to date with their annual eye exam?  No  Who is the provider or what is the name of the office in which the patient attends annual eye exams? Dr. Dione Booze Has an appt in March  Dental Screening: Recommended annual dental exams for proper oral hygiene  Community Resource Referral / Chronic Care Management: CRR required this visit?  No   CCM required this visit?  No      Plan:     I have personally reviewed and noted the following in the patient's chart:   . Medical and social history . Use of alcohol, tobacco or illicit drugs  . Current medications and supplements . Functional ability and status . Nutritional status . Physical activity . Advanced directives . List of other physicians . Hospitalizations, surgeries, and ER visits in previous 12 months . Vitals . Screenings to include cognitive, depression, and falls . Referrals and appointments  In addition, I have reviewed and discussed with patient certain preventive protocols, quality metrics, and best practice recommendations. A written personalized care plan for preventive services as well as general preventive health recommendations were provided to patient.     Roanna Raider, LPN   1/0/2725  Nurse Health Advisor  Nurse Notes: None

## 2020-08-25 ENCOUNTER — Ambulatory Visit (INDEPENDENT_AMBULATORY_CARE_PROVIDER_SITE_OTHER): Payer: Medicare Other

## 2020-08-25 ENCOUNTER — Other Ambulatory Visit: Payer: Self-pay

## 2020-08-25 VITALS — BP 118/80 | HR 92 | Temp 97.9°F | Resp 16 | Ht 71.0 in | Wt 243.0 lb

## 2020-08-25 DIAGNOSIS — Z0000 Encounter for general adult medical examination without abnormal findings: Secondary | ICD-10-CM | POA: Diagnosis not present

## 2020-08-25 NOTE — Patient Instructions (Signed)
Mr. Mason [name] , Thank you for taking time to come for your Medicare Wellness Visit. I appreciate your ongoing commitment to your health goals. Please review the following plan we discussed and let me know if I can assist you in the future.   Screening recommendations/referrals: Colonoscopy: No longer required Recommended yearly ophthalmology/optometry visit for glaucoma screening and checkup Recommended yearly dental visit for hygiene and checkup  Vaccinations: Influenza vaccine: Up to date Pneumococcal vaccine: Completed vaccines Tdap vaccine: Up to date-Due-08/29/2021 Shingles vaccine: Discuss with pharmacy Covid-19: Completed vaccines  Advanced directives: Information given today  Conditions/risks identified: See problem list  Next appointment: Follow up in one year for your annual wellness visit.   Preventive Care 78 Years and Older, Male Preventive care refers to lifestyle choices and visits with your health care provider that can promote health and wellness. What does preventive care include?  A yearly physical exam. This is also called an annual well check.  Dental exams once or twice a year.  Routine eye exams. Ask your health care provider how often you should have your eyes checked.  Personal lifestyle choices, including:  Daily care of your teeth and gums.  Regular physical activity.  Eating a healthy diet.  Avoiding tobacco and drug use.  Limiting alcohol use.  Practicing safe sex.  Taking low doses of aspirin every day.  Taking vitamin and mineral supplements as recommended by your health care provider. What happens during an annual well check? The services and screenings done by your health care provider during your annual well check will depend on your age, overall health, lifestyle risk factors, and family history of disease. Counseling  Your health care provider may ask you questions about your:  Alcohol use.  Tobacco use.  Drug use.  Emotional  well-being.  Home and relationship well-being.  Sexual activity.  Eating habits.  History of falls.  Memory and ability to understand (cognition).  Work and work Statistician. Screening  You may have the following tests or measurements:  Height, weight, and BMI.  Blood pressure.  Lipid and cholesterol levels. These may be checked every 5 years, or more frequently if you are over 73 years old.  Skin check.  Lung cancer screening. You may have this screening every year starting at age 48 if you have a 30-pack-year history of smoking and currently smoke or have quit within the past 15 years.  Fecal occult blood test (FOBT) of the stool. You may have this test every year starting at age 25.  Flexible sigmoidoscopy or colonoscopy. You may have a sigmoidoscopy every 5 years or a colonoscopy every 10 years starting at age 7.  Prostate cancer screening. Recommendations will vary depending on your family history and other risks.  Hepatitis C blood test.  Hepatitis B blood test.  Sexually transmitted disease (STD) testing.  Diabetes screening. This is done by checking your blood sugar (glucose) after you have not eaten for a while (fasting). You may have this done every 1-3 years.  Abdominal aortic aneurysm (AAA) screening. You may need this if you are a current or former smoker.  Osteoporosis. You may be screened starting at age 57 if you are at high risk. Talk with your health care provider about your test results, treatment options, and if necessary, the need for more tests. Vaccines  Your health care provider may recommend certain vaccines, such as:  Influenza vaccine. This is recommended every year.  Tetanus, diphtheria, and acellular pertussis (Tdap, Td) vaccine. You may need  a Td booster every 10 years.  Zoster vaccine. You may need this after age 53.  Pneumococcal 13-valent conjugate (PCV13) vaccine. One dose is recommended after age 68.  Pneumococcal  polysaccharide (PPSV23) vaccine. One dose is recommended after age 20. Talk to your health care provider about which screenings and vaccines you need and how often you need them. This information is not intended to replace advice given to you by your health care provider. Make sure you discuss any questions you have with your health care provider. Document Released: 08/06/2015 Document Revised: 03/29/2016 Document Reviewed: 05/11/2015 Elsevier Interactive Patient Education  2017 [employer] Prevention in the Home Falls can cause injuries. They can happen to people of all ages. There are many things you can do to make your home safe and to help prevent falls. What can I do on the outside of my home?  Regularly fix the edges of walkways and driveways and fix any cracks.  Remove anything that might make you trip as you walk through a door, such as a raised step or threshold.  Trim any bushes or trees on the path to your home.  Use bright outdoor lighting.  Clear any walking paths of anything that might make someone trip, such as rocks or tools.  Regularly check to see if handrails are loose or broken. Make sure that both sides of any steps have handrails.  Any raised decks and porches should have guardrails on the edges.  Have any leaves, snow, or ice cleared regularly.  Use sand or salt on walking paths during winter.  Clean up any spills in your garage right away. This includes oil or grease spills. What can I do in the bathroom?  Use night lights.  Install grab bars by the toilet and in the tub and shower. Do not use towel bars as grab bars.  Use non-skid mats or decals in the tub or shower.  If you need to sit down in the shower, use a plastic, non-slip stool.  Keep the floor dry. Clean up any water that spills on the floor as soon as it happens.  Remove soap buildup in the tub or shower regularly.  Attach bath mats securely with double-sided non-slip rug  tape.  Do not have throw rugs and other things on the floor that can make you trip. What can I do in the bedroom?  Use night lights.  Make sure that you have a light by your bed that is easy to reach.  Do not use any sheets or blankets that are too big for your bed. They should not hang down onto the floor.  Have a firm chair that has side arms. You can use this for support while you get dressed.  Do not have throw rugs and other things on the floor that can make you trip. What can I do in the kitchen?  Clean up any spills right away.  Avoid walking on wet floors.  Keep items that you use a lot in easy-to-reach places.  If you need to reach something above you, use a strong step stool that has a grab bar.  Keep electrical cords out of the way.  Do not use floor polish or wax that makes floors slippery. If you must use wax, use non-skid floor wax.  Do not have throw rugs and other things on the floor that can make you trip. What can I do with my stairs?  Do not leave any items on the  stairs.  Make sure that there are handrails on both sides of the stairs and use them. Fix handrails that are broken or loose. Make sure that handrails are as long as the stairways.  Check any carpeting to make sure that it is firmly attached to the stairs. Fix any carpet that is loose or worn.  Avoid having throw rugs at the top or bottom of the stairs. If you do have throw rugs, attach them to the floor with carpet tape.  Make sure that you have a light switch at the top of the stairs and the bottom of the stairs. If you do not have them, ask someone to add them for you. What else can I do to help prevent falls?  Wear shoes that:  Do not have high heels.  Have rubber bottoms.  Are comfortable and fit you well.  Are closed at the toe. Do not wear sandals.  If you use a stepladder:  Make sure that it is fully opened. Do not climb a closed stepladder.  Make sure that both sides of the  stepladder are locked into place.  Ask someone to hold it for you, if possible.  Clearly mark and make sure that you can see:  Any grab bars or handrails.  First and last steps.  Where the edge of each step is.  Use tools that help you move around (mobility aids) if they are needed. These include:  Canes.  Walkers.  Scooters.  Crutches.  Turn on the lights when you go into a dark area. Replace any light bulbs as soon as they burn out.  Set up your furniture so you have a clear path. Avoid moving your furniture around.  If any of your floors are uneven, fix them.  If there are any pets around you, be aware of where they are.  Review your medicines with your doctor. Some medicines can make you feel dizzy. This can increase your chance of falling. Ask your doctor what other things that you can do to help prevent falls. This information is not intended to replace advice given to you by your health care provider. Make sure you discuss any questions you have with your health care provider. Document Released: 05/06/2009 Document Revised: 12/16/2015 Document Reviewed: 08/14/2014 Elsevier Interactive Patient Education  2017 [employer].

## 2020-08-30 ENCOUNTER — Telehealth: Payer: Self-pay | Admitting: Family Medicine

## 2020-08-31 NOTE — Telephone Encounter (Signed)
RF request for Meclizine 25mg  LOV:06/22/20 Next ov: 09/20/20 Last written:12/04/19(270,1)  Please advise, thanks. Medication pending

## 2020-09-01 ENCOUNTER — Other Ambulatory Visit: Payer: Self-pay | Admitting: Cardiovascular Disease

## 2020-09-01 NOTE — Telephone Encounter (Signed)
Patient's wife returned call. I relayed refill sent message. She voiced understanding.

## 2020-09-01 NOTE — Telephone Encounter (Signed)
Left detailed message advising refill sent. Okay per [facility]

## 2020-09-03 ENCOUNTER — Encounter: Payer: Medicare Other | Attending: Internal Medicine | Admitting: Dietician

## 2020-09-03 ENCOUNTER — Other Ambulatory Visit: Payer: Self-pay

## 2020-09-03 ENCOUNTER — Other Ambulatory Visit: Payer: Self-pay | Admitting: Family Medicine

## 2020-09-03 ENCOUNTER — Encounter: Payer: Self-pay | Admitting: Dietician

## 2020-09-03 DIAGNOSIS — E1165 Type 2 diabetes mellitus with hyperglycemia: Secondary | ICD-10-CM | POA: Insufficient documentation

## 2020-09-03 DIAGNOSIS — E1159 Type 2 diabetes mellitus with other circulatory complications: Secondary | ICD-10-CM | POA: Diagnosis not present

## 2020-09-03 NOTE — Patient Instructions (Addendum)
Consider avoid foods/beverages that have Phos... in the ingredient list. Be mindful of the carbohydrate in what you drink.  Generally zero carbohydrates are best.  Recommend increasing your water intake  Great job reducing your meat portion and limiting red meat!  Goal:  Deck of card portion 1/2 your plate non starchy vegetables  Mindfulness:  Choices  Eat slowly and stop when satisfied  Monitor your [place], notice the jump in your blood sugar from before a meal to after.  This generally should be around a 50 point jump 2 hours after starting a meal.  Sometimes this will be less and that is fine.  Read your labels. Stay active.  Goal 30 minutes most days.

## 2020-09-03 NOTE — Progress Notes (Signed)
Medical Nutrition Therapy  Appointment Start time:  0900  Appointment End time:  40  Primary concerns today: Patient is here today alone.  He wants to make sure that he is taking the insulin correctly and improve his eating habits.  Referral diagnosis: uncontrolled Type 2 Diabetes Preferred learning style:  no preference indicated Learning readiness:  contemplating   NUTRITION ASSESSMENT   Anthropometrics  247 lbs   Clinical Medical Hx: Type 2 Diabetes (20011 and insulin since 2019), CAD,nonischemic CMP, CHF, Afib, cerebrovascular disease with h/o TIA, CKD 3, peripheral neuropathy, ED. Medications: Trulicity, Jardiance, Lantus 52 units q HS, Novolog 14 units prior to meals (usually takes bid as he frequently skips lunch) Labs: A1C 9.7% 06/22/2020 increased from 9% 03/23/2020 Notable Signs/Symptoms: none  Lifestyle & Dietary Hx Patient lives with his wife.  They share shopping and cooking.  He is a retired Armed forces technical officer.   Estimated daily fluid intake: doesn't think that he is drinking enough water "12 oz daily" Supplements: none Sleep: 6-8 but wakes 2-3 times to urinate Stress / self-care: OK Current average weekly physical activity: treadmill at home - 1/2 mile 3 times per week and is working to increase this.  24-Hr Dietary Recall First Meal: pancakes, regular syrup, bacon OR eggs and bacon, biscuit or toast with butter and jelly OR regular oatmeal OR plain cheerios,1% milk OR bagel and cream cheese Snack: none Second Meal: skips at times as he is not hungry Snack: NABS or fruit Third Meal: Lasagne, french bread, occasional salad or vegetables Snack: popcorn or fig cookie Beverages: water, 1%milk, bottled sweet or unsweet green tea, 1-2 beer per day,  rare diet coke, black coffee  NUTRITION DIAGNOSIS  NB-1.1 Food and nutrition-related knowledge deficit As related to balance of carbohydrate, protein, and fat.  As evidenced by diet hx and patient  report.   NUTRITION INTERVENTION  Nutrition education (E-1) on the following topics:  . Diabetes, insulin resistance and what effects the blood sugar . Carbohydrate amounts in highly concentrated sugar products such as regular soda and syrup . Importance of adequate water . Balance plate (1/2 the plate non starchy vegetables, 3 oz portion of meat, balanced carbohydrates) . Mindfulness in choices and feeling of satisfaction/fullness during a meal . Glucose readings and assessment of glucose post meal . Importance of exercise and goals  Handouts Provided Include  How to Thrive:  A Guide for Your Journey with Diabetes by the ADA Meal Plan Card Snack list Label reading  Learning Style & Readiness for Change Teaching method utilized: Visual & Auditory  Demonstrated degree of understanding via: Teach Back  Barriers to learning/adherence to lifestyle change: difficulty in changing  Goals Established by Pt . Patient aims to be more active . Observe his LIbre readings and the effect of food, beverages, and exercise on his glucose . Be mindful with food choices and plate composition  MONITORING & EVALUATION Dietary intake, weekly physical activity, and label reading in 2 months.  Next Steps  Patient is to call or communicate via MyChart for questions.

## 2020-09-08 ENCOUNTER — Other Ambulatory Visit: Payer: Self-pay | Admitting: Internal Medicine

## 2020-09-09 ENCOUNTER — Encounter: Payer: Self-pay | Admitting: Internal Medicine

## 2020-09-09 ENCOUNTER — Ambulatory Visit (INDEPENDENT_AMBULATORY_CARE_PROVIDER_SITE_OTHER): Payer: Medicare Other | Admitting: Internal Medicine

## 2020-09-09 ENCOUNTER — Other Ambulatory Visit: Payer: Self-pay

## 2020-09-09 VITALS — BP 128/80 | HR 74 | Ht 71.0 in | Wt 243.4 lb

## 2020-09-09 DIAGNOSIS — E785 Hyperlipidemia, unspecified: Secondary | ICD-10-CM | POA: Diagnosis not present

## 2020-09-09 DIAGNOSIS — E1159 Type 2 diabetes mellitus with other circulatory complications: Secondary | ICD-10-CM | POA: Diagnosis not present

## 2020-09-09 DIAGNOSIS — E1165 Type 2 diabetes mellitus with hyperglycemia: Secondary | ICD-10-CM

## 2020-09-09 LAB — POCT GLYCOSYLATED HEMOGLOBIN (HGB A1C): Hemoglobin A1C: 7.6 % — AB (ref 4.0–5.6)

## 2020-09-09 MED ORDER — ROSUVASTATIN CALCIUM 20 MG PO TABS: 20.0000 mg | ORAL_TABLET | Freq: Every day | ORAL | 3 refills | Status: DC

## 2020-09-09 MED ORDER — TRULICITY 1.5 MG/0.5ML ~~LOC~~ SOAJ: 1.5000 mg | SUBCUTANEOUS | 2 refills | Status: DC

## 2020-09-09 NOTE — Addendum Note (Signed)
Addended by: Lauralyn Primes on: 09/09/2020 03:30 PM   Modules accepted: Orders

## 2020-09-09 NOTE — Progress Notes (Addendum)
Patient ID: Mason [name], male   DOB: [birth year], 80 y.o.   MRN: [id number]   This visit occurred during the SARS-CoV-2 public health emergency.  Safety protocols were in place, including screening questions prior to the visit, additional usage of staff PPE, and extensive cleaning of exam room while observing appropriate contact time as indicated for disinfecting solutions.   HPI: Mason [name] is a 80 y.o.-year-old male, referred by his PCP, Dr. Anitra Lauth, for management of DM2, dx in ~2011, insulin-dependent since 2019, uncontrolled, with long-term complications (CAD with ?h/o AMI, nonischemic CMP, CHF, Afib, cerebrovascular disease with h/o TIA, CKD stage III, peripheral neuropathy, ED).  Today he came by himself, without his wife.  Reviewed HbA1c levels: Lab Results  Component Value Date   HGBA1C 9.7 (H) 06/22/2020   HGBA1C 9.0 (H) 03/23/2020   HGBA1C 9.2 (H) 03/16/2020   HGBA1C 9.9 (H) 11/20/2019   HGBA1C 9.7 (H) 05/27/2019   HGBA1C 9.0 (H) 02/26/2019   HGBA1C 10.1 (H) 11/29/2018   HGBA1C 9.0 (A) 08/29/2018   HGBA1C 9.0 08/29/2018   HGBA1C 9.0 (A) 08/29/2018   HGBA1C 9.0 (A) 08/29/2018   HGBA1C 7.8 (A) 05/14/2018   HGBA1C 8.1 (H) 02/06/2018   HGBA1C 8.0 (H) 10/26/2017   HGBA1C 8.2 (H) 07/27/2017   HGBA1C 7.0 03/01/2017   HGBA1C 7.1 11/16/2016   HGBA1C 7.8 (H) 07/03/2016   HGBA1C 6.8 03/14/2016   HGBA1C 7.0 (H) 12/09/2015   HGBA1C 7.4 (H) 09/20/2015   HGBA1C 7.2 (H) 03/25/2015   HGBA1C 7.5 (H) 10/15/2014   HGBA1C 7.1 (H) 06/15/2014   HGBA1C 6.9 (H) 02/12/2014   HGBA1C 8.6 (H) 10/14/2013   HGBA1C 7.5 (H) 07/15/2013   HGBA1C 6.7 (H) 01/03/2013   At last visit he was on: - Jardiance 10 mg before or after breakfast >> frequent urination and nocturia 4x a night - Lantus 58  units at bedtime - Novolog 18 units 2x a day, before or after meals He was on Metformin and Glipizide in the past.  We changed to: - Jardiance 10 mg before breakfast - Trulicity 2.35 mg weekly - Lantus  58 >> however, he is using 52  units at bedtime - Novolog 12-20 units 3x a day, 15 min before meals >> however, he is using b-l-d: 06-30-09 units  Pt checks his sugars more than 4 times a day with his freestyle libre CGM:   - am: 120s-220s >> 75-130 - 2h after b'fast: n/c >> 80-130 - before lunch: 170-230 >> 80-140 - 2h after lunch: 180-250 >> 150-170 - before dinner: 110-180 >> 80-140 - 2h after dinner and snack: 230-300 >> 120-180 - bedtime: see above - nighttime: n/c Lowest sugar was: 45 >> 70; he has hypoglycemia awareness at 70.  Highest sugar was 400 >> 200s.  Glucometer: Freestyle  Pt's meals are: - Breakfast: cereals, eggs, grits, toast - Lunch: may skip or sandwich - Dinner: meat + veggies + starch - Snacks: apple/cheese Patient saw nutrition a long time ago.  -+ Stage III CKD, last BUN/creatinine:  Lab Results  Component Value Date   BUN 28 (H) 06/22/2020   BUN 36 (H) 03/23/2020   CREATININE 1.57 (H) 06/22/2020   CREATININE 1.73 (H) 03/23/2020  On Entresto.  -+ HL; last set of lipids: Lab Results  Component Value Date   CHOL 168 06/22/2020   HDL 38.80 (L) 06/22/2020   LDLCALC 85 03/16/2020   LDLDIRECT 103.0 06/22/2020   TRIG 215.0 (H) 06/22/2020   CHOLHDL 4 06/22/2020  On Zocor 20.  - last eye exam was in 03/2020: No DR, + glaucoma reportedly. Coming up next mo.  -He has numbness and tingling in his feet.  On Neurontin 300 mg twice a day.  No known FH of DM.  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  Drinks beer 2-3x a day, but not quite every day.  ROS: Constitutional: no weight gain/no weight loss, + fatigue, no subjective hyperthermia, no subjective hypothermia, + nocturia Eyes: no blurry vision, no xerophthalmia ENT: no sore throat, no nodules palpated in neck, no dysphagia, no odynophagia, no hoarseness Cardiovascular: no CP/+ SOB/no palpitations/no leg  swelling Respiratory: no cough/+ SOB/no wheezing Gastrointestinal: no N/no V/no D/no C/no acid reflux Musculoskeletal: no muscle aches/no joint aches Skin: no rashes, + hair loss Neurological: no tremors/+ numbness/+ tingling/no dizziness  I reviewed pt's medications, allergies, PMH, social hx, family hx, and changes were documented in the history of present illness. Otherwise, unchanged from my initial visit note.  Past Medical History:  Diagnosis Date  . AICD (automatic cardioverter/defibrillator) present 2018   MDT CRT-D.  Fatigue-->completely pacer dependent.  Pacer settings adjusted 12/2017 to allow more chronotrophc variance with ADL's//exertion.  . Arthritis    Pt is s/p eft reverse total shoulder arthroplasty.  . Balanitis    chronic fungal  . BPH (benign prostatic hypertrophy) 06/2011   Irritative sx's; pt declined trial of anticholinergic per Urology records  . Chronic combined systolic and diastolic heart failure ([place]) 05/31/2012   Nonischemic:  EF 40-45%, LA mod-severe dilated, AFIB.   02/2016 EF 40%, diffuse hypokinesis, grade 2 DD.  Myoc perf imaging showed EF 32% 04/2016.  Pt upgraded to CRT-D 01/04/17.  [name] Chronic renal insufficiency, stage III (moderate) (HCC) 2015   CrCl about 60 ml/min  . Complete heart block (HCC)    Has dual chamber pacer.  . Depression   . DOE (dyspnea on exertion)    NYHA class II/III CHF  . Dyspnea 2021   with exertion, bending over  . Episodic low back pain 01/22/2013   w/intermittent radiculitis (12/2014 his neurologist referred him to pain mgmt for epidural steroid injection)  . Erectile dysfunction 2019   due to zoloft--urol rx'd viagra  . GERD (gastroesophageal reflux disease)   . H/O tilt table evaluation 11/02/05   negative  . Helicobacter pylori gastritis 01/2016  . History of adenomatous polyp of colon 10/12/11   Dr. [name] (3 right side of colon- tubular adenomas removed)  . History of cardiovascular stress test 05/28/12   no ischemia,  EF 37%, imaging results are unchanged and within normal variance  . History of chronic prostatitis   . History of kidney stones   . History of vertigo    + Hx of posterior HA's.  Neuro (Dr. Erling Cruz) eval 2011.  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  . Hyperlipidemia   . Hypertension   . Lumbar spondylosis    lumbosacral radiculopathy at L4 by EMG testing, right foot drop (neurologist is Dr. Linus Salmons with [facility] in [place])--neurologist referred him to neurosurgery  . Migraine    "used to have them all the time; none for years" (01/04/2017)  . Myocardial infarction [facility]) ?1970s   not entirely certain of this  . Nephrolithiasis 07/2012   Left UVJ 2 mm stone with dilation of renal collecting system and slight hydroureter on right  .  Neuropathy   . NICM (nonischemic cardiomyopathy) ([place])    a. 02/2018 Cath: LM nl, LAD min irregs, LCX no, RCA 20d. PFXT02. Fick CO/CI 4.4/2.0.  . 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  Since no CAD on cath 05/2016, cards recommends upgrade to CRT-D.  [name] Permanent atrial fibrillation ([place])    DCCV 07/09/13-converted, lasted two days, then back into afib--needs lifetime anticoagulation (Xarelto as of 09/2014)  . Prostate cancer screening 09/2017   done by urol annually (normal prostate exam documented + PSA 0.84 as of 10/01/17 urol f/u.  10/2018 urol f/u PSA 0.6, no prostate nodule.  . Rectus diastasis   . Right ankle sprain 08/2017   w/distal fibula avulsion fx noted on u/s but not plain film-(Dr. Hudnall).  . Skin cancer of arm, left    "burned it off" (01/04/2017)  . TIA (transient ischemic attack)    L face and L arm weakness. Peri procedural->a. 03/22/2018 following cath. CT head neg. No MRI b/c has pacer. Likely due to embolus to distal branch of RMCA  . Type II diabetes mellitus ([place])    Past Surgical History:   Procedure Laterality Date  . ABI's Bilateral 05/21/2018   normal  . BACK SURGERY    . BIV ICD INSERTION CRT-D N/A 01/04/2017   Procedure: BiV ICD ;  Surgeon: Constance Haw, MD;  Location: [place] [facility];  Service: Cardiovascular;  Laterality: N/A;  . CARDIAC CATHETERIZATION N/A 06/14/2016   Minimal nonobstructive dz, EF 25-35%.  Procedure: Left Heart Cath and Coronary Angiography;  Surgeon: Peter M Martinique, MD;  Location: [place] [facility];  Service: Cardiovascular;  Laterality: N/A;  . CARDIOVASCULAR STRESS TEST  2012   2012 nuclear perfusion study: low risk scan; 04/2016 normal myocardial perfusion imaging, EF 32%.  [name] CARDIOVERSION  07/09/2012   Procedure: CARDIOVERSION;  Surgeon: [name], MD;  Location: [facility];  Service: Cardiovascular;  Laterality: N/A;  . CATARACT EXTRACTION W/ INTRAOCULAR LENS IMPLANT & ANTERIOR VITRECTOMY, BILATERAL Bilateral   . CIRCUMCISION N/A 03/23/2020   Procedure: CIRCUMCISION ADULT;  Surgeon: Remi Haggard, MD;  Location: [facility];  Service: Urology;  Laterality: N/A;  . COLONOSCOPY W/ POLYPECTOMY  approx 2006; repeated 09/2011   Polyps on 2013 EGD as well, repeat 12/2014  . ESOPHAGOGASTRODUODENOSCOPY  10/18/06   Done due to chronic GERD: Normal, bx showed no barrett's esophagus (Dr. [name])  . FLEXOR TENDON REPAIR Left 10/02/2016   Procedure: LEFT RING FINGER WOUND EXPORATION AND FLEXOR TENDON REPAIR AND NERVE REPAIR;  Surgeon: Milly Jakob, MD;  Location: [place];  Service: Orthopedics;  Laterality: Left;  . INSERT / REPLACE / REMOVE PACEMAKER  02/05/2012   dual chamber, sinus node dysfunction, sinus arrest, PAF, Medtronic Revo serial#-[id number] H: last checked 05/2015  . LUMBAR LAMINECTOMY Left 1976   L4-5  . PACEMAKER REMOVAL  01/04/2017  . PERMANENT PACEMAKER INSERTION N/A 02/05/2012   Procedure: PERMANENT PACEMAKER INSERTION;  Surgeon: [name], MD; Generator Medtronic Revo model [place] serial number [id number] H Laterality: N/A;   . RETINAL DETACHMENT SURGERY Left ~ 1999  . REVERSE SHOULDER ARTHROPLASTY Left 2018   Left shoulder reverse [name] in [place]).  [name] RIGHT/LEFT HEART CATH AND CORONARY ANGIOGRAPHY N/A 03/22/2018   EF 30-35%, no CAD.  Procedure: RIGHT/LEFT HEART CATH AND CORONARY ANGIOGRAPHY;  Surgeon: Jolaine Artist, MD;  Location: [place] [facility];  Service: Cardiovascular;  Laterality: N/A;  . TRANSTHORACIC ECHOCARDIOGRAM  08/25/10; 05/2012;  03/23/16;12/2017   mild asymmetric LVH, normal systolic function, normal diastolic fxn, mild-to-mod mitral regurg, mild aortic valve sclerosis and trace AI, mild aortic root dilatation. 2014 f/u showed EF 40-45%, mod LAE, A FIB.  02/2016 EF 40%, diffuse hypokinesis, grade 2 DD. 12/2017 EF 35-40%,diffuse hypokin,grd III DD, mild MR  . WISDOM TOOTH EXTRACTION     Social History   Socioeconomic History  . Marital status: Married    Spouse name: Not on file  . Number of children: Not on file  . Years of education: Not on file  . Highest education level: Not on file  Occupational History  . Not on file  Tobacco Use  . Smoking status: Never Smoker  . Smokeless tobacco: Never Used  Vaping Use  . Vaping Use: Never used  Substance and Sexual Activity  . Alcohol use: Yes    Comment: occ  . Drug use: No  . Sexual activity: Not on file  Other Topics Concern  . Not on file  Social History Narrative  . Not on file   Social Determinants of Health   Financial Resource Strain: Low Risk   . Difficulty of Paying Living Expenses: Not hard at all  Food Insecurity: No Food Insecurity  . Worried About Charity fundraiser in the Last Year: Never true  . Ran Out of Food in the Last Year: Never true  Transportation Needs: No Transportation Needs  . Lack of Transportation (Medical): No  . Lack of Transportation (Non-Medical): No  Physical Activity: Sufficiently Active  . Days of Exercise per Week: 7 days  . Minutes of Exercise per Session: 30 min  Stress: No  Stress Concern Present  . Feeling of Stress : Not at all  Social Connections: Moderately Integrated  . Frequency of Communication with Friends and Family: More than three times a week  . Frequency of Social Gatherings with Friends and Family: More than three times a week  . Attends Religious Services: Never  . Active Member of Clubs or Organizations: Yes  . Attends Archivist Meetings: More than 4 times per year  . Marital Status: Married  Human resources officer Violence: Not At Risk  . Fear of Current or Ex-Partner: No  . Emotionally Abused: No  . Physically Abused: No  . Sexually Abused: No   Current Outpatient Medications on File Prior to Visit  Medication Sig Dispense Refill  . acetaminophen (TYLENOL) 325 MG tablet Take 650 mg by mouth every 6 (six) hours as needed for moderate pain.    . BD INSULIN SYRINGE U/F 31G X 5/16" 1 ML MISC USE DAILY AS DIRECTED 100 each 3  . COMBIGAN 0.2-0.5 % ophthalmic solution Place 1 drop into both eyes at bedtime.     . Continuous Blood Gluc Receiver (FREESTYLE LIBRE 2 READER) DEVI USE TO CHECK BLOOD SUGAR 1-2 TIMES DAILY. 1 each 3  . Continuous Blood Gluc Sensor (FREESTYLE LIBRE 14 DAY SENSOR) MISC Check glucose 4 times per day 6 each 3  . Continuous Blood Gluc Sensor (FREESTYLE LIBRE SENSOR SYSTEM) MISC USE TO CHECK BLOOD SUGARS 4 TIMES DAILY. Before breakfast, before lunch, before supper, and at bedtime 1 each 3  . Continuous Blood Gluc Transmit (DEXCOM G6 TRANSMITTER) MISC 1 Device by Does not apply route every 3 (three) months. 1 each 3  . ENTRESTO 97-103 MG TAKE 1 TABLET TWO TIMES A DAY 180 tablet 3  . eplerenone (INSPRA) 25 MG tablet TAKE 1 TABLET DAILY 90 tablet 3  .  gabapentin (NEURONTIN) 300 MG capsule 2 caps po bid (Patient taking differently: Take 300 mg by mouth in the morning and at bedtime.) 360 capsule 3  . glucose blood test strip Use to check blood sugars 1-2 times daily 100 each 3  . insulin aspart (NOVOLOG FLEXPEN) 100 UNIT/ML  FlexPen INJECT 16 UNITS UNDER THE SKIN AT THE TIME OF EACH MEAL 15 mL 4  . insulin glargine (LANTUS) 100 UNIT/ML injection INJECT 56 UNITS UNDER THE SKIN DAILY. PATIENT WILL BE TITRATING HIS DOSE PERIODICALLY 50 mL 1  . Insulin Pen Needle (PEN NEEDLES) 31G X 5 MM MISC USE TO INJECT INSULIN SQ. 100 each 2  . JARDIANCE 10 MG TABS tablet TAKE 1 TABLET DAILY 90 tablet 3  . Lancets (FREESTYLE) lancets USE AS INSTRUCTED 100 each 11  . latanoprost (XALATAN) 0.005 % ophthalmic solution Place 1 drop into both eyes at bedtime.   12  . meclizine (ANTIVERT) 25 MG tablet TAKE 1 TABLET THREE TIMES A DAY AS NEEDED FOR DIZZINESS 270 tablet 3  . metoprolol succinate (TOPROL-XL) 25 MG 24 hr tablet Take 1 tablet (25 mg total) by mouth daily. Take with or immediately following a meal. 90 tablet 1  . metoprolol succinate (TOPROL-XL) 50 MG 24 hr tablet TAKE 1 TABLET DAILY. TAKE WITH OR IMMEDIATELY FOLLOWING A MEAL (Patient not taking: Reported on 09/03/2020) 90 tablet 0  . omeprazole (PRILOSEC) 40 MG capsule Take 1 capsule (40 mg total) by mouth daily. 90 capsule 1  . oxybutynin (DITROPAN-XL) 5 MG 24 hr tablet TAKE 1 TABLET AT BEDTIME 90 tablet 3  . oxyCODONE-acetaminophen (PERCOCET) 5-325 MG tablet Take 1 tablet by mouth every 4 (four) hours as needed for severe pain. 20 tablet 0  . rOPINIRole (REQUIP) 1 MG tablet TAKE 1 TABLET DAILY 90 tablet 3  . sertraline (ZOLOFT) 100 MG tablet TAKE 1 TABLET DAILY 90 tablet 0  . simvastatin (ZOCOR) 20 MG tablet TAKE 1 TABLET EVERY EVENING (Patient taking differently: Take 20 mg by mouth every evening.) 90 tablet 3  . triamcinolone (KENALOG) 0.1 % SMARTSIG:1 Application Topical 2-3 Times Daily    . TRULICITY 7.16 RC/7.8LF SOPN ADMINISTER 0.75 MG UNDER THE SKIN IN THE MORNING ONCE WEEKLY 2 mL 2  . XARELTO 20 MG TABS tablet TAKE 1 TABLET DAILY WITH SUPPER (Patient taking differently: Take 20 mg by mouth daily with supper.) 90 tablet 3   No current facility-administered medications on  file prior to visit.   No Known Allergies Family History  Problem Relation Age of Onset  . Heart failure Mother   . Stroke Mother   . Stroke Father   . Heart disease Sister   . Heart disease Sister   . Cancer Sister        liver  . Cancer Brother        lung  . Cancer Brother        lung  . Heart disease Brother    PE: BP 128/80   Pulse 74   Ht 5\' 11"  (1.803 m)   Wt 243 lb 6.4 oz (110.4 kg)   SpO2 95%   BMI 33.95 kg/m  Wt Readings from Last 3 Encounters:  09/09/20 243 lb 6.4 oz (110.4 kg)  09/03/20 247 lb (112 kg)  08/25/20 243 lb (110.2 kg)   Constitutional: overweight, in NAD Eyes: PERRLA, EOMI, no exophthalmos ENT: moist mucous membranes, no thyromegaly, no cervical lymphadenopathy Cardiovascular: RRR, No MRG Respiratory: CTA B Gastrointestinal: abdomen soft, NT, ND, BS+  Musculoskeletal: no deformities, strength intact in all 4 Skin: moist, warm, no rashes Neurological: no tremor with outstretched hands, DTR normal in all 4  ASSESSMENT: 1. DM2, insulin-dependent, uncontrolled, with complications: - CAD with ?h/o AMI - nonischemic CMP - CHF, s/p AICD - Afib, s/p pacemaker - cerebrovascular disease with h/o TIA - CKD stage III - peripheral neuropathy - ED  No family history of medullary thyroid cancer or personal history of pancreatitis.  2. HL  PLAN:  1. Patient with longstanding, uncontrolled, type 2 diabetes, on oral antidiabetic regimen with SGLT2 inhibitor, also basal-bolus insulin regimen and now also weekly GLP-1 receptor agonist, added at last visit.  I initially sent a prescription for Trulicity to his pharmacy but this was not approved.  A PA for Ozempic, however, was approved so he is not on this medication.  He tolerates this well. -At last visit I sent the prescription for the Dexcom CGM to aspirin mail order pharmacy, but he was not able to obtain this.  At last visit we reviewed his CGM tracings on his phone but could not download the reports.   Sugars are very fluctuating, from the 40s to the 300s, slightly lower in the morning but they were increasing after the first meal of the day only to plummet in the afternoon, before dinner.  After dinner, sugars are abruptly increasing again, until approximately 12 AM, after which they started to improve.  Upon questioning at that time he was not taking the rapid acting insulin 15 minutes before meals but at variable times either before or after meals.  We discussed about the importance of taking NovoLog 15-30 minutes before meals.  Also, he was taking a fixed dose of NovoLog and I advised him to use a more flexible regimen.  We also discussed about improving his meals, by eliminating cereals and also eliminating eating between meals and snacking at night.  I also recommended limiting his beer intake to at most 2 a day and ideally not every day.  He also accepted a referral to nutrition - he saw Antonieta Iba since then. CGM interpretation: -At today's visit, we reviewed her CGM downloads: It appears that 78% of values are in target range (goal >70%), while 19% are higher than 180 (goal <25%), and 3% are lower than 70 (goal <4%).  The calculated average blood sugar is 139.  The projected HbA1c for the next 3 months ([facility]) is 6.6%. -Reviewing the CGM trends,  sugars are much improved compared to before.  They are slightly higher after lunch and dinner (in the last week) but remain high overnight, dropping in the second half of the night to target labs in the morning. Upon questioning, he is taking a lower dose of Lantus, and we can decrease his Humalog to avoid further drops overnight.  Also, he takes a lower dose of NovoLog blood sugars after lunch are higher so I advised him to increase NovoLog with lunch.  Since he is only on a low-dose of Trulicity, I advised him to increase this to 1.5 mg weekly. - I suggested to:  Patient Instructions  Please continue: - Jardiance 10 mg before breakfast  Please  increase: - Trulicity to 1.5 mg weekly - NovoLog: 07-02-09 units before B-L-D.  Decrease: - Lantus 44 units at bedtime  Please stop Zocor and start Crestor 20 mg daily.  Please return in 3 months.   - we checked his HbA1c: 7.6% (much better) - advised to check sugars at different times of  the day - 4x a day, rotating check times - advised for yearly eye exams >> he is  UTD - return to clinic in 3 months   2. HL - Reviewed latest lipid panel from 05/2020: HDL low, LDL above target, triglycerides high Lab Results  Component Value Date   CHOL 168 06/22/2020   HDL 38.80 (L) 06/22/2020   LDLCALC 85 03/16/2020   LDLDIRECT 103.0 06/22/2020   TRIG 215.0 (H) 06/22/2020   CHOLHDL 4 06/22/2020  - Continues Zocor 20 without side effects. -At this visit, I suggested to change to Crestor 20 mg for more pronounced effect and more cardiovascular protection.  He agrees to change this.  Philemon Kingdom, MD PhD [facility] Endocr[facility]ology

## 2020-09-09 NOTE — Patient Instructions (Addendum)
Please continue: - Jardiance 10 mg before breakfast  Please increase: - Trulicity to 1.5 mg weekly - NovoLog: 07-02-09 units before B-L-D.  Decrease: - Lantus 44 units at bedtime  Please stop Zocor and start Crestor 20 mg daily.  Please return in 3 months.

## 2020-09-14 ENCOUNTER — Other Ambulatory Visit: Payer: Self-pay

## 2020-09-14 ENCOUNTER — Telehealth: Payer: Self-pay | Admitting: Family Medicine

## 2020-09-14 DIAGNOSIS — K219 Gastro-esophageal reflux disease without esophagitis: Secondary | ICD-10-CM | POA: Insufficient documentation

## 2020-09-14 DIAGNOSIS — E1165 Type 2 diabetes mellitus with hyperglycemia: Secondary | ICD-10-CM

## 2020-09-14 DIAGNOSIS — IMO0002 Reserved for concepts with insufficient information to code with codable children: Secondary | ICD-10-CM

## 2020-09-14 MED ORDER — INSULIN GLARGINE 100 UNIT/ML ~~LOC~~ SOLN: SUBCUTANEOUS | 1 refills | Status: DC

## 2020-09-14 MED ORDER — INSULIN GLARGINE 100 UNIT/ML ~~LOC~~ SOLN: SUBCUTANEOUS | 0 refills | Status: DC

## 2020-09-14 NOTE — Telephone Encounter (Signed)
Rx sent with updated sig, 60 units based on lab results from 06/12/20.

## 2020-09-14 NOTE — Telephone Encounter (Signed)
Patient requesting refill of Lantus. This time, please send to [facility] in [place].

## 2020-09-14 NOTE — Telephone Encounter (Addendum)
Patient was made aware refill sent but Rx sent to wrong pharmacy. Sp[facility] with [facility] [place] [facility] for [facility]antus and new script sent to [facility] in [place].

## 2020-09-15 ENCOUNTER — Other Ambulatory Visit: Payer: Self-pay | Admitting: Family Medicine

## 2020-09-15 DIAGNOSIS — E1165 Type 2 diabetes mellitus with hyperglycemia: Secondary | ICD-10-CM

## 2020-09-15 DIAGNOSIS — IMO0002 Reserved for concepts with insufficient information to code with codable children: Secondary | ICD-10-CM

## 2020-09-17 ENCOUNTER — Other Ambulatory Visit: Payer: Self-pay

## 2020-09-20 ENCOUNTER — Telehealth: Payer: Self-pay

## 2020-09-20 ENCOUNTER — Ambulatory Visit: Payer: Medicare Other | Admitting: Family Medicine

## 2020-09-20 NOTE — Progress Notes (Deleted)
OFFICE VISIT  09/20/2020  CC: No chief complaint on file.  HPI:    Patient is a 80 y.o. Caucasian male who presents for 3 mo f/u HTN, HLD, CRI III. A/P as of last visit: "1) DM 2, poor control. Noncompliant with diet.  Exercise is minimal. Compliant with meds.  Unfortunately he is vague with report of home gluc monitoring numbers. Current regimen of lantus 56 U qd and novolog 16 U qAC plus jardiance 10mg  qd. He expressed interest in seeing an endocrinologist today so I have made this referral. No treatment regimen changes at this time. Hba1c today. Feet exam today is remarkable only for onychomycosis.  2) Ischemic CM, chronic a-fib, complete HB: stable on max medical management + CRT.   Continue all current meds and check lytes/cr and cbc today.  3) HLD/ischemic heart dz: tolerating simva 20mg  qd. LDL was near goal 3 mo ago (85). FLP and hepatic panel today.  Consider switch to atorva or rosuva.  4) CRI III: he avoids NSAIDs.   Hydrates fairly well. Lytes/cr today."  INTERIM HX: ***   He has nonischemic CM (syst and diast dysfxn) and has CRT-D, also chronic a-fib and complete heart block (pacer dependent),andconsistently has NYHA class II/III sx's. Most recent cardiology f/u 06/14/20: all stable, remains on max medical mgmt + CRT-D.  DM:  now followed by Dr. Cruzita Lederer in endocrinology, most recent f/u with her 09/09/20 showed great improvement in control, Hba1c was 7.6%!  HLD: at most recent f/u with Dr. Cruzita Lederer she switched his zocor 20 to crestor 20 qd for more robust effect/protection. LDL 85 on 03/16/20.  HTN:  CRI:   Past Medical History:  Diagnosis Date  . AICD (automatic cardioverter/defibrillator) present 2018   MDT CRT-D.  Fatigue-->completely pacer dependent.  Pacer settings adjusted 12/2017 to allow more chronotrophc variance with ADL's//exertion.  . Arthritis    Pt is s/p eft reverse total shoulder arthroplasty.  . Balanitis    chronic fungal  . BPH  (benign prostatic hypertrophy) 06/2011   Irritative sx's; pt declined trial of anticholinergic per Urology records  . Chronic combined systolic and diastolic heart failure ([place]) 05/31/2012   Nonischemic:  EF 40-45%, LA mod-severe dilated, AFIB.   02/2016 EF 40%, diffuse hypokinesis, grade 2 DD.  Myoc perf imaging showed EF 32% 04/2016.  Pt upgraded to CRT-D 01/04/17.  [name] Chronic renal insufficiency, stage III (moderate) (HCC) 2015   CrCl about 60 ml/min  . Complete heart block (HCC)    Has dual chamber pacer.  . Depression   . DOE (dyspnea on exertion)    NYHA class II/III CHF  . Dyspnea 2021   with exertion, bending over  . Episodic low back pain 01/22/2013   w/intermittent radiculitis (12/2014 his neurologist referred him to pain mgmt for epidural steroid injection)  . Erectile dysfunction 2019   due to zoloft--urol rx'd viagra  . GERD (gastroesophageal reflux disease)   . H/O tilt table evaluation 11/02/05   negative  . Helicobacter pylori gastritis 01/2016  . History of adenomatous polyp of colon 10/12/11   Dr. [name] (3 right side of colon- tubular adenomas removed)  . History of cardiovascular stress test 05/28/12   no ischemia, EF 37%, imaging results are unchanged and within normal variance  . History of chronic prostatitis   . History of kidney stones   . History of vertigo    + Hx of posterior HA's.  Neuro (Dr. Erling Cruz) eval 2011.  Abnormal MRI: bicerebral small vessel dz  without brainstem involvement.  Congenitally small posterior circulation.  . Hyperlipidemia   . Hypertension   . Lumbar spondylosis    lumbosacral radiculopathy at L4 by EMG testing, right foot drop (neurologist is Dr. Linus Salmons with [facility] in [place])--neurologist referred him to neurosurgery  . Migraine    "used to have them all the time; none for years" (01/04/2017)  . Myocardial infarction [facility]) ?1970s   not entirely certain of this  . Nephrolithiasis 07/2012   Left UVJ 2 mm stone with dilation  of renal collecting system and slight hydroureter on right  . Neuropathy   . NICM (nonischemic cardiomyopathy) ([place])    a. 02/2018 Cath: LM nl, LAD min irregs, LCX no, RCA 20d. [id number]. Fick CO/CI 4.4/2.0.  . 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  Since no CAD on cath 05/2016, cards recommends upgrade to CRT-D.  [name] Permanent atrial fibrillation ([place])    DCCV 07/09/13-converted, lasted two days, then back into afib--needs lifetime anticoagulation (Xarelto as of 09/2014)  . Prostate cancer screening 09/2017   done by urol annually (normal prostate exam documented + PSA 0.84 as of 10/01/17 urol f/u.  10/2018 urol f/u PSA 0.6, no prostate nodule.  . Rectus diastasis   . Right ankle sprain 08/2017   w/distal fibula avulsion fx noted on u/s but not plain film-(Dr. Hudnall).  . Skin cancer of arm, left    "burned it off" (01/04/2017)  . TIA (transient ischemic attack)    L face and L arm weakness. Peri procedural->a. 03/22/2018 following cath. CT head neg. No MRI b/c has pacer. Likely due to embolus to distal branch of RMCA  . Type II diabetes mellitus ([place])     Past Surgical History:  Procedure Laterality Date  . ABI's Bilateral 05/21/2018   normal  . BACK SURGERY    . BIV ICD INSERTION CRT-D N/A 01/04/2017   Procedure: BiV ICD ;  Surgeon: Constance Haw, MD;  Location: [place] [facility];  Service: Cardiovascular;  Laterality: N/A;  . CARDIAC CATHETERIZATION N/A 06/14/2016   Minimal nonobstructive dz, EF 25-35%.  Procedure: Left Heart Cath and Coronary Angiography;  Surgeon: Peter M Martinique, MD;  Location: [place] [facility];  Service: Cardiovascular;  Laterality: N/A;  . CARDIOVASCULAR STRESS TEST  2012   2012 nuclear perfusion study: low risk scan; 04/2016 normal myocardial perfusion imaging, EF 32%.  [name] CARDIOVERSION  07/09/2012   Procedure: CARDIOVERSION;  Surgeon: [name], MD;  Location: [facility];  Service:  Cardiovascular;  Laterality: N/A;  . CATARACT EXTRACTION W/ INTRAOCULAR LENS IMPLANT & ANTERIOR VITRECTOMY, BILATERAL Bilateral   . CIRCUMCISION N/A 03/23/2020   Procedure: CIRCUMCISION ADULT;  Surgeon: Remi Haggard, MD;  Location: [facility];  Service: Urology;  Laterality: N/A;  . COLONOSCOPY W/ POLYPECTOMY  approx 2006; repeated 09/2011   Polyps on 2013 EGD as well, repeat 12/2014  . ESOPHAGOGASTRODUODENOSCOPY  10/18/06   Done due to chronic GERD: Normal, bx showed no barrett's esophagus (Dr. [name])  . FLEXOR TENDON REPAIR Left 10/02/2016   Procedure: LEFT RING FINGER WOUND EXPORATION AND FLEXOR TENDON REPAIR AND NERVE REPAIR;  Surgeon: Milly Jakob, MD;  Location: [facility];  Service: Orthopedics;  Laterality: Left;  . INSERT / REPLACE / REMOVE PACEMAKER  02/05/2012   dual chamber, sinus node dysfunction, sinus arrest, PAF, Medtronic Revo serial#-[id number] H: last checked 05/2015  . LUMBAR LAMINECTOMY Left 1976   L4-5  . PACEMAKER REMOVAL  01/04/2017  .  PERMANENT PACEMAKER INSERTION N/A 02/05/2012   Procedure: PERMANENT PACEMAKER INSERTION;  Surgeon: [name], MD; Generator Medtronic Revo model [place] serial number [id number] H Laterality: N/A;  . RETINAL DETACHMENT SURGERY Left ~ 1999  . REVERSE SHOULDER ARTHROPLASTY Left 2018   Left shoulder reverse [name] in [place]).  [name] RIGHT/LEFT HEART CATH AND CORONARY ANGIOGRAPHY N/A 03/22/2018   EF 30-35%, no CAD.  Procedure: RIGHT/LEFT HEART CATH AND CORONARY ANGIOGRAPHY;  Surgeon: Jolaine Artist, MD;  Location: [place] [facility];  Service: Cardiovascular;  Laterality: N/A;  . TRANSTHORACIC ECHOCARDIOGRAM  08/25/10; 05/2012; 03/23/16;12/2017   mild asymmetric LVH, normal systolic function, normal diastolic fxn, mild-to-mod mitral regurg, mild aortic valve sclerosis and trace AI, mild aortic root dilatation. 2014 f/u showed EF 40-45%, mod LAE, A FIB.  02/2016 EF 40%, diffuse hypokinesis, grade 2 DD. 12/2017 EF 35-40%,diffuse hypokin,grd  III DD, mild MR  . WISDOM TOOTH EXTRACTION      Outpatient Medications Prior to Visit  Medication Sig Dispense Refill  . acetaminophen (TYLENOL) 325 MG tablet Take 650 mg by mouth every 6 (six) hours as needed for moderate pain.    . BD INSULIN SYRINGE U/F 31G X 5/16" 1 ML MISC USE DAILY AS DIRECTED 100 each 3  . COMBIGAN 0.2-0.5 % ophthalmic solution Place 1 drop into both eyes at bedtime.     . Continuous Blood Gluc Sensor (FREESTYLE LIBRE 14 DAY SENSOR) MISC Check glucose 4 times per day 6 each 3  . Continuous Blood Gluc Sensor (FREESTYLE LIBRE SENSOR SYSTEM) MISC USE TO CHECK BLOOD SUGARS 4 TIMES DAILY. Before breakfast, before lunch, before supper, and at bedtime 1 each 3  . Dulaglutide (TRULICITY) 1.5 TM/1.9QQ SOPN Inject 1.5 mg into the skin once a week. 6 mL 2  . ENTRESTO 97-103 MG TAKE 1 TABLET TWO TIMES A DAY 180 tablet 3  . eplerenone (INSPRA) 25 MG tablet TAKE 1 TABLET DAILY 90 tablet 3  . gabapentin (NEURONTIN) 300 MG capsule 2 caps po bid (Patient taking differently: Take 300 mg by mouth in the morning and at bedtime.) 360 capsule 3  . glucose blood test strip Use to check blood sugars 1-2 times daily 100 each 3  . insulin aspart (NOVOLOG FLEXPEN) 100 UNIT/ML FlexPen INJECT 16 UNITS UNDER THE SKIN AT THE TIME OF EACH MEAL (Patient taking differently: Pt taking 12 units in the morning 8 units with light lunch or snack 10 units with dinner.) 15 mL 4  . insulin glargine (LANTUS) 100 UNIT/ML injection INJECT 60 UNITS UNDER THE SKIN DAILY. PATIENT WILL BE TITRATING HIS DOSE PERIODICALLY 10 mL 0  . Insulin Pen Needle (PEN NEEDLES) 31G X 5 MM MISC USE TO INJECT INSULIN SQ. 100 each 2  . JARDIANCE 10 MG TABS tablet TAKE 1 TABLET DAILY 90 tablet 3  . Lancets (FREESTYLE) lancets USE AS INSTRUCTED 100 each 11  . latanoprost (XALATAN) 0.005 % ophthalmic solution Place 1 drop into both eyes at bedtime.   12  . meclizine (ANTIVERT) 25 MG tablet TAKE 1 TABLET THREE TIMES A DAY AS NEEDED FOR  DIZZINESS 270 tablet 3  . metoprolol succinate (TOPROL-XL) 50 MG 24 hr tablet TAKE 1 TABLET DAILY. TAKE WITH OR IMMEDIATELY FOLLOWING A MEAL (Patient taking differently: Take by mouth daily. Pt taking 1/2 tablet (25 mg) daily) 90 tablet 0  . omeprazole (PRILOSEC) 40 MG capsule Take 1 capsule (40 mg total) by mouth daily. 90 capsule 1  . oxybutynin (DITROPAN-XL) 5 MG 24 hr  tablet TAKE 1 TABLET AT BEDTIME 90 tablet 3  . oxyCODONE-acetaminophen (PERCOCET) 5-325 MG tablet Take 1 tablet by mouth every 4 (four) hours as needed for severe pain. 20 tablet 0  . rOPINIRole (REQUIP) 1 MG tablet TAKE 1 TABLET DAILY 90 tablet 3  . rosuvastatin (CRESTOR) 20 MG tablet Take 1 tablet (20 mg total) by mouth daily. 90 tablet 3  . sertraline (ZOLOFT) 100 MG tablet TAKE 1 TABLET DAILY 90 tablet 0  . triamcinolone (KENALOG) 0.1 % SMARTSIG:1 Application Topical 2-3 Times Daily    . XARELTO 20 MG TABS tablet TAKE 1 TABLET DAILY WITH SUPPER (Patient taking differently: Take 20 mg by mouth daily with supper.) 90 tablet 3   No facility-administered medications prior to visit.    No Known Allergies  ROS As per HPI  PE: Vitals with BMI 09/09/2020 09/03/2020 08/25/2020  Height 5\' 11"  - 5\' 11"   Weight 243 lbs 6 oz 247 lbs 243 lbs  BMI 33.96 29.93 71.69  Systolic 678 - 938  Diastolic 80 - 80  Pulse 74 - 92     ***  LABS:  Lab Results  Component Value Date   TSH 2.963 04/04/2018   Lab Results  Component Value Date   WBC 5.3 06/22/2020   HGB 16.8 06/22/2020   HCT 50.7 06/22/2020   MCV 91.5 06/22/2020   PLT 141.0 (L) 06/22/2020   Lab Results  Component Value Date   CREATININE 1.57 (H) 06/22/2020   BUN 28 (H) 06/22/2020   NA 137 06/22/2020   K 4.7 06/22/2020   CL 100 06/22/2020   CO2 32 06/22/2020   Lab Results  Component Value Date   ALT 14 06/22/2020   AST 19 06/22/2020   ALKPHOS 86 06/22/2020   BILITOT 0.7 06/22/2020   Lab Results  Component Value Date   CHOL 168 06/22/2020   Lab Results   Component Value Date   HDL 38.80 (L) 06/22/2020   Lab Results  Component Value Date   LDLCALC 85 03/16/2020   Lab Results  Component Value Date   TRIG 215.0 (H) 06/22/2020   Lab Results  Component Value Date   CHOLHDL 4 06/22/2020   Lab Results  Component Value Date   HGBA1C 7.6 (A) 09/09/2020   IMPRESSION AND PLAN:  No problem-specific Assessment & Plan notes found for this encounter.   An After Visit Summary was printed and given to the patient.  FOLLOW UP: No follow-ups on file.  Signed:  Crissie Sickles, MD           09/20/2020

## 2020-09-20 NOTE — Telephone Encounter (Signed)
[  8:[place]: Your 1000, [name], cancelled due to cold symptoms. He declined VV. Appt rescheduled for 3/14  [facility] - Client Nonclinical Telephone Record  [facility] Client [place] [facility] - Client Client Site [place] - Day Physician Crissie Sickles - MD Contact Type Call Who Is Calling Patient / Member / Family / Caregiver Caller Name Mason [name] Caller Phone Number [phone] Patient Name Mason [name] Patient DOB 1941/05/19 Call Type Message Only Information Provided Reason for Call Request to Reschedule Office Appointment Initial Comment Caller states he needs to cancel his 1000 AM appointment, having cold symptoms. Disp. Time Disposition Final User 09/20/2020 8:01:28 AM General Information Provided Yes Baruch Goldmann Call Closed By: Baruch Goldmann Transaction Date/Time: 09/20/2020 7:58:30 AM (ET)

## 2020-09-23 ENCOUNTER — Encounter (INDEPENDENT_AMBULATORY_CARE_PROVIDER_SITE_OTHER): Payer: Self-pay | Admitting: Ophthalmology

## 2020-09-23 ENCOUNTER — Ambulatory Visit (INDEPENDENT_AMBULATORY_CARE_PROVIDER_SITE_OTHER): Payer: Medicare Other | Admitting: Ophthalmology

## 2020-09-23 ENCOUNTER — Other Ambulatory Visit: Payer: Self-pay

## 2020-09-23 DIAGNOSIS — T8529XD Other mechanical complication of intraocular lens, subsequent encounter: Secondary | ICD-10-CM | POA: Diagnosis not present

## 2020-09-23 DIAGNOSIS — Z8669 Personal history of other diseases of the nervous system and sense organs: Secondary | ICD-10-CM | POA: Diagnosis not present

## 2020-09-23 DIAGNOSIS — E1165 Type 2 diabetes mellitus with hyperglycemia: Secondary | ICD-10-CM | POA: Diagnosis not present

## 2020-09-23 DIAGNOSIS — H43811 Vitreous degeneration, right eye: Secondary | ICD-10-CM

## 2020-09-23 DIAGNOSIS — H278 Other specified disorders of lens: Secondary | ICD-10-CM | POA: Diagnosis not present

## 2020-09-23 DIAGNOSIS — E1159 Type 2 diabetes mellitus with other circulatory complications: Secondary | ICD-10-CM

## 2020-09-23 DIAGNOSIS — H353132 Nonexudative age-related macular degeneration, bilateral, intermediate dry stage: Secondary | ICD-10-CM | POA: Insufficient documentation

## 2020-09-23 DIAGNOSIS — T8529XA Other mechanical complication of intraocular lens, initial encounter: Secondary | ICD-10-CM | POA: Insufficient documentation

## 2020-09-23 NOTE — Assessment & Plan Note (Signed)

## 2020-09-23 NOTE — Progress Notes (Signed)
09/23/2020     CHIEF COMPLAINT Patient presents for Retina Follow Up (1 Year F/U OU//Pt reports fluctuating DVA and NVA OU. Pt denies any other new symptoms OU.)   HISTORY OF PRESENT ILLNESS: Mason [name] is a 80 y.o. male who presents to the clinic today for:   HPI    Retina Follow Up    Patient presents with  Other.  In both eyes.  This started 1 year ago.  Severity is mild.  Duration of 1 year.  Since onset it is stable. Additional comments: 1 Year F/U OU  Pt reports fluctuating DVA and NVA OU. Pt denies any other new symptoms OU.       Last edited by Rockie Neighbours, [place] on 09/23/2020  9:02 AM. (History)      Referring physician: Tammi Sou, MD [address] [place],  [place] [address]  HISTORICAL INFORMATION:   Selected notes from the MEDICAL RECORD NUMBER    Lab Results  Component Value Date   HGBA1C 7.6 (A) 09/09/2020     CURRENT MEDICATIONS: Current Outpatient Medications (Ophthalmic Drugs)  Medication Sig  . COMBIGAN 0.2-0.5 % ophthalmic solution Place 1 drop into both eyes at bedtime.   [name] latanoprost (XALATAN) 0.005 % ophthalmic solution Place 1 drop into both eyes at bedtime.    No current facility-administered medications for this visit. (Ophthalmic Drugs)   Current Outpatient Medications (Other)  Medication Sig  . acetaminophen (TYLENOL) 325 MG tablet Take 650 mg by mouth every 6 (six) hours as needed for moderate pain.  . BD INSULIN SYRINGE U/F 31G X 5/16" 1 ML MISC USE DAILY AS DIRECTED  . Continuous Blood Gluc Sensor (FREESTYLE LIBRE 14 DAY SENSOR) MISC Check glucose 4 times per day  . Continuous Blood Gluc Sensor (FREESTYLE LIBRE SENSOR SYSTEM) MISC USE TO CHECK BLOOD SUGARS 4 TIMES DAILY. Before breakfast, before lunch, before supper, and at bedtime  . Dulaglutide (TRULICITY) 1.5 XB/1.4NW SOPN Inject 1.5 mg into the skin once a week.  [name] ENTRESTO 97-103 MG TAKE 1 TABLET TWO TIMES A DAY  . eplerenone (INSPRA) 25 MG tablet TAKE 1 TABLET DAILY   . gabapentin (NEURONTIN) 300 MG capsule 2 caps po bid (Patient taking differently: Take 300 mg by mouth in the morning and at bedtime.)  . glucose blood test strip Use to check blood sugars 1-2 times daily  . insulin aspart (NOVOLOG FLEXPEN) 100 UNIT/ML FlexPen INJECT 16 UNITS UNDER THE SKIN AT THE TIME OF EACH MEAL (Patient taking differently: Pt taking 12 units in the morning 8 units with light lunch or snack 10 units with dinner.)  . insulin glargine (LANTUS) 100 UNIT/ML injection INJECT 60 UNITS UNDER THE SKIN DAILY. PATIENT WILL BE TITRATING HIS DOSE PERIODICALLY  . Insulin Pen Needle (PEN NEEDLES) 31G X 5 MM MISC USE TO INJECT INSULIN SQ.  [name] JARDIANCE 10 MG TABS tablet TAKE 1 TABLET DAILY  . Lancets (FREESTYLE) lancets USE AS INSTRUCTED  . meclizine (ANTIVERT) 25 MG tablet TAKE 1 TABLET THREE TIMES A DAY AS NEEDED FOR DIZZINESS  . metoprolol succinate (TOPROL-XL) 50 MG 24 hr tablet TAKE 1 TABLET DAILY. TAKE WITH OR IMMEDIATELY FOLLOWING A MEAL (Patient taking differently: Take by mouth daily. Pt taking 1/2 tablet (25 mg) daily)  . omeprazole (PRILOSEC) 40 MG capsule Take 1 capsule (40 mg total) by mouth daily.  [name] oxybutynin (DITROPAN-XL) 5 MG 24 hr tablet TAKE 1 TABLET AT BEDTIME  . oxyCODONE-acetaminophen (PERCOCET) 5-325 MG  tablet Take 1 tablet by mouth every 4 (four) hours as needed for severe pain.  [name] rOPINIRole (REQUIP) 1 MG tablet TAKE 1 TABLET DAILY  . rosuvastatin (CRESTOR) 20 MG tablet Take 1 tablet (20 mg total) by mouth daily.  . sertraline (ZOLOFT) 100 MG tablet TAKE 1 TABLET DAILY  . triamcinolone (KENALOG) 0.1 % SMARTSIG:1 Application Topical 2-3 Times Daily  . XARELTO 20 MG TABS tablet TAKE 1 TABLET DAILY WITH SUPPER (Patient taking differently: Take 20 mg by mouth daily with supper.)   No current facility-administered medications for this visit. (Other)      REVIEW OF SYSTEMS:    ALLERGIES No Known Allergies  PAST MEDICAL HISTORY Past Medical History:   Diagnosis Date  . AICD (automatic cardioverter/defibrillator) present 2018   MDT CRT-D.  Fatigue-->completely pacer dependent.  Pacer settings adjusted 12/2017 to allow more chronotrophc variance with ADL's//exertion.  . Arthritis    Pt is s/p eft reverse total shoulder arthroplasty.  . Balanitis    chronic fungal  . BPH (benign prostatic hypertrophy) 06/2011   Irritative sx's; pt declined trial of anticholinergic per Urology records  . Chronic combined systolic and diastolic heart failure ([place]) 05/31/2012   Nonischemic:  EF 40-45%, LA mod-severe dilated, AFIB.   02/2016 EF 40%, diffuse hypokinesis, grade 2 DD.  Myoc perf imaging showed EF 32% 04/2016.  Pt upgraded to CRT-D 01/04/17.  [name] Chronic renal insufficiency, stage III (moderate) (HCC) 2015   CrCl about 60 ml/min  . Complete heart block (HCC)    Has dual chamber pacer.  . Depression   . DOE (dyspnea on exertion)    NYHA class II/III CHF  . Dyspnea 2021   with exertion, bending over  . Episodic low back pain 01/22/2013   w/intermittent radiculitis (12/2014 his neurologist referred him to pain mgmt for epidural steroid injection)  . Erectile dysfunction 2019   due to zoloft--urol rx'd viagra  . GERD (gastroesophageal reflux disease)   . H/O tilt table evaluation 11/02/05   negative  . Helicobacter pylori gastritis 01/2016  . History of adenomatous polyp of colon 10/12/11   Dr. [name] (3 right side of colon- tubular adenomas removed)  . History of cardiovascular stress test 05/28/12   no ischemia, EF 37%, imaging results are unchanged and within normal variance  . History of chronic prostatitis   . History of kidney stones   . History of vertigo    + Hx of posterior HA's.  Neuro (Dr. Erling Cruz) eval 2011.  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  . Hyperlipidemia   . Hypertension   . Lumbar spondylosis    lumbosacral radiculopathy at L4 by EMG testing, right foot  drop (neurologist is Dr. Linus Salmons with [facility] in [place])--neurologist referred him to neurosurgery  . Migraine    "used to have them all the time; none for years" (01/04/2017)  . Myocardial infarction [facility]) ?1970s   not entirely certain of this  . Nephrolithiasis 07/2012   Left UVJ 2 mm stone with dilation of renal collecting system and slight hydroureter on right  . Neuropathy   . NICM (nonischemic cardiomyopathy) ([place])    a. 02/2018 Cath: LM nl, LAD min irregs, LCX no, RCA 20d. [id number]. Fick CO/CI 4.4/2.0.  . 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  Since no CAD on cath 05/2016, cards recommends upgrade to CRT-D.  [name] Permanent atrial fibrillation ([place])  DCCV 07/09/13-converted, lasted two days, then back into afib--needs lifetime anticoagulation (Xarelto as of 09/2014)  . Prostate cancer screening 09/2017   done by urol annually (normal prostate exam documented + PSA 0.84 as of 10/01/17 urol f/u.  10/2018 urol f/u PSA 0.6, no prostate nodule.  . Rectus diastasis   . Right ankle sprain 08/2017   w/distal fibula avulsion fx noted on u/s but not plain film-(Dr. Hudnall).  . Skin cancer of arm, left    "burned it off" (01/04/2017)  . TIA (transient ischemic attack)    L face and L arm weakness. Peri procedural->a. 03/22/2018 following cath. CT head neg. No MRI b/c has pacer. Likely due to embolus to distal branch of RMCA  . Type II diabetes mellitus ([place])    Past Surgical History:  Procedure Laterality Date  . ABI's Bilateral 05/21/2018   normal  . BACK SURGERY    . BIV ICD INSERTION CRT-D N/A 01/04/2017   Procedure: BiV ICD ;  Surgeon: Constance Haw, MD;  Location: [place];  Service: Cardiovascular;  Laterality: N/A;  . CARDIAC CATHETERIZATION N/A 06/14/2016   Minimal nonobstructive dz, EF 25-35%.  Procedure: Left Heart Cath and Coronary Angiography;  Surgeon: Peter M Martinique, MD;  Location: [facility];  Service: Cardiovascular;  Laterality: N/A;  . CARDIOVASCULAR STRESS TEST  2012   2012 nuclear perfusion study: low risk scan; 04/2016 normal myocardial perfusion imaging, EF 32%.  [name] CARDIOVERSION  07/09/2012   Procedure: CARDIOVERSION;  Surgeon: [name], MD;  Location: [facility];  Service: Cardiovascular;  Laterality: N/A;  . CATARACT EXTRACTION W/ INTRAOCULAR LENS IMPLANT & ANTERIOR VITRECTOMY, BILATERAL Bilateral   . CIRCUMCISION N/A 03/23/2020   Procedure: CIRCUMCISION ADULT;  Surgeon: Remi Haggard, MD;  Location: [facility];  Service: Urology;  Laterality: N/A;  . COLONOSCOPY W/ POLYPECTOMY  approx 2006; repeated 09/2011   Polyps on 2013 EGD as well, repeat 12/2014  . ESOPHAGOGASTRODUODENOSCOPY  10/18/06   Done due to chronic GERD: Normal, bx showed no barrett's esophagus (Dr. [name])  . FLEXOR TENDON REPAIR Left 10/02/2016   Procedure: LEFT RING FINGER WOUND EXPORATION AND FLEXOR TENDON REPAIR AND NERVE REPAIR;  Surgeon: Milly Jakob, MD;  Location: [facility];  Service: Orthopedics;  Laterality: Left;  . INSERT / REPLACE / REMOVE PACEMAKER  02/05/2012   dual chamber, sinus node dysfunction, sinus arrest, PAF, Medtronic Revo serial#-[id number] H: last checked 05/2015  . LUMBAR LAMINECTOMY Left 1976   L4-5  . PACEMAKER REMOVAL  01/04/2017  . PERMANENT PACEMAKER INSERTION N/A 02/05/2012   Procedure: PERMANENT PACEMAKER INSERTION;  Surgeon: [name], MD; Generator Medtronic Revo model [place] serial number [id number] H Laterality: N/A;  . RETINAL DETACHMENT SURGERY Left ~ 1999  . REVERSE SHOULDER ARTHROPLASTY Left 2018   Left shoulder reverse [name] in [place]).  [name] RIGHT/LEFT HEART CATH AND CORONARY ANGIOGRAPHY N/A 03/22/2018   EF 30-35%, no CAD.  Procedure: RIGHT/LEFT HEART CATH AND CORONARY ANGIOGRAPHY;  Surgeon: Jolaine Artist, MD;  Location: [facility];  Service: Cardiovascular;  Laterality: N/A;  . TRANSTHORACIC ECHOCARDIOGRAM  08/25/10;  05/2012; 03/23/16;12/2017   mild asymmetric LVH, normal systolic function, normal diastolic fxn, mild-to-mod mitral regurg, mild aortic valve sclerosis and trace AI, mild aortic root dilatation. 2014 f/u showed EF 40-45%, mod LAE, A FIB.  02/2016 EF 40%, diffuse hypokinesis, grade 2 DD. 12/2017 EF 35-40%,diffuse hypokin,grd III DD, mild MR  . WISDOM TOOTH EXTRACTION      FAMILY HISTORY  Family History  Problem Relation Age of Onset  . Heart failure Mother   . Stroke Mother   . Stroke Father   . Heart disease Sister   . Heart disease Sister   . Cancer Sister        liver  . Cancer Brother        lung  . Cancer Brother        lung  . Heart disease Brother     SOCIAL HISTORY Social History   Tobacco Use  . Smoking status: Never Smoker  . Smokeless tobacco: Never Used  Vaping Use  . Vaping Use: Never used  Substance Use Topics  . Alcohol use: Yes    Comment: occ  . Drug use: No         OPHTHALMIC EXAM:  Base Eye Exam    Visual Acuity (ETDRS)      Right Left   Dist [place] 20/25 -2 20/50 +1   Dist ph [place]  NI       Tonometry (Tonopen, 9:03 AM)      Right Left   Pressure 16 18       Pupils      Dark Light Shape React APD   Right 5 4 Round Brisk None   Left 5 4 Round Slow None       Visual Fields (Counting fingers)      Left Right    Full    Restrictions  Partial outer superior nasal deficiency       Extraocular Movement      Right Left    Full Full       Neuro/Psych    Oriented x3: Yes   Mood/Affect: Normal       Dilation    Both eyes: 1.0% Mydriacyl, 2.5% Phenylephrine @ 9:08 AM        Slit Lamp and Fundus Exam    Slit Lamp Exam      Right Left   Lids/Lashes Normal Normal   Conjunctiva/Sclera White and quiet White and quiet   Cornea Clear Clear   Anterior Chamber Deep and quiet Deep and quiet   Iris Round and reactive Round and reactive   Lens Posterior chamber intraocular lens Posterior chamber intraocular lens   Anterior Vitreous Normal Normal        Fundus Exam      Right Left   Posterior Vitreous Posterior vitreous detachment Posterior vitreous detachment, Central vitreous floaters   Disc Normal Normal   C/D Ratio 0.65 0.7   Macula Normal No topographic distortion   Vessels Normal, , no DR Normal, , no DR   Periphery Normal Good retinopexy temporally, good buckle          IMAGING AND PROCEDURES  Imaging and Procedures for 09/23/20  OCT, Retina - OU - Both Eyes       Right Eye Quality was good. Central Foveal Thickness: 325. Progression has been stable. Findings include normal foveal contour, retinal drusen , no IRF, no SRF.   Left Eye Quality was good. Scan locations included subfoveal. Central Foveal Thickness: 302. Progression has been stable. Findings include epiretinal membrane, abnormal foveal contour, retinal drusen , no IRF, no SRF.   Notes OS with minor epiretinal membrane temporal to the fovea, with no foveal distortion will observe  OD with drusen and drusenoid deposits subfoveal.  No signs of active CNVM                ASSESSMENT/PLAN:  Pseudophakodonesis  I discussed with the patient at length the possibility of vitrectomy, it would require also the removal of this lens in the bag and placement of his suspended 3 piece intraocular lens using the Yamani scleral tunnel technique.  I explained that the current capsular bag out zonular support is not usable in a safe fashion.  I also explained that the surgical procedure took about 30 minutes in an outpatient surgical setting and would afford stability of vision but still may require glasses prescriptions thereafter.  Moreover sometimes chronic use of topical medication such as a topical nonsteroidal anti-inflammatory agent such as ketorolac once or twice a day may be required long-term because of the nature of the use of a 3 piece lens with scleral tunnel fixation  Intermediate stage nonexudative age-related macular degeneration of both eyes The  nature of age--related macular degeneration was discussed with the patient as well as the distinction between dry and wet types. Checking an Amsler Grid daily with advice to return immediately should a distortion develop, was given to the patient. The patient 's smoking status now and in the past was determined and advice based on the AREDS study was provided regarding the consumption of antioxidant supplements. AREDS 2 vitamin formulation was recommended. Consumption of dark leafy vegetables and fresh fruits of various colors was recommended. Treatment modalities for wet macular degeneration particularly the use of intravitreal injections of anti-blood vessel growth factors was discussed with the patient. Avastin, Lucentis, and Eylea are the available options. On occasion, therapy includes the use of photodynamic therapy and thermal laser. Stressed to the patient do not rub eyes.  Patient was advised to check Amsler Grid daily and return immediately if changes are noted. Instructions on using the grid were given to the patient. All patient questions were answered.  Poorly controlled type 2 diabetes mellitus with circulatory disorder [facility]) The patient has diabetes without any evidence of retinopathy. The patient advised to maintain good blood glucose control, excellent blood pressure control, and favorable levels of cholesterol, low density lipoprotein, and high density lipoproteins. Follow up in 1 year was recommended. Explained that fluctuations in visual acuity , or "out of focus", may result from large variations of blood sugar control.      ICD-10-CM   1. Intermediate stage nonexudative age-related macular degeneration of both eyes  H35.3132 OCT, Retina - OU - Both Eyes  2. History of retinal detachment  Z86.69   3. 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 Posterior vitreous detachment of right eye  H43.811   5. Pseudophakodonesis, subsequent encounter  T85.29XD   6. Poorly controlled type 2 diabetes mellitus with  circulatory disorder (HCC)  E11.59    E11.65     1.  Patient to continue to monitor his vision left eye on a monocular basis so he will no when or if something were to change might require intervention to reestablish good functioning of the visual acuity  2.  To notify the office promptly of new onset visual acuity declines or distortions in either eye  3.  Ophthalmic Meds Ordered this visit:  No orders of the defined types were placed in this encounter.      Return in about 6 months (around 03/26/2021) for DILATE OU, COLOR FP, OCT.  There are no Patient Instructions on file for this visit.   Explained the diagnoses, plan, and follow up with the patient and they expressed understanding.  Patient expressed understanding of the importance of proper follow up care.   Clent Demark  Karolyna Bianchini M.D. Diseases & Surgery of the Retina and Vitreous Retina [facility] Diabetic [place] 09/23/20     Abbreviations: M myopia (nearsighted); A astigmatism; H hyperopia (farsighted); P presbyopia; Mrx spectacle prescription;  CTL contact lenses; OD right eye; OS left eye; OU both eyes  XT exotropia; ET esotropia; PEK punctate epithelial keratitis; PEE punctate epithelial erosions; DES dry eye syndrome; MGD meibomian gland dysfunction; ATs artificial tears; PFAT's preservative free artificial tears; [place] nuclear sclerotic cataract; PSC posterior subcapsular cataract; ERM epi-retinal membrane; PVD posterior vitreous detachment; RD retinal detachment; DM diabetes mellitus; DR diabetic retinopathy; NPDR non-proliferative diabetic retinopathy; PDR proliferative diabetic retinopathy; CSME clinically significant macular edema; DME diabetic macular edema; dbh dot blot hemorrhages; CWS cotton wool spot; POAG primary open angle glaucoma; C/D cup-to-disc ratio; HVF humphrey visual field; GVF goldmann visual field; OCT optical coherence tomography; IOP intraocular pressure; BRVO Branch retinal vein occlusion; CRVO central retinal vein  occlusion; CRAO central retinal artery occlusion; BRAO branch retinal artery occlusion; RT retinal tear; SB scleral buckle; PPV pars plana vitrectomy; VH Vitreous hemorrhage; PRP panretinal laser photocoagulation; IVK intravitreal kenalog; VMT vitreomacular traction; MH Macular hole;  NVD neovascularization of the disc; NVE neovascularization elsewhere; AREDS age related eye disease study; ARMD age related macular degeneration; POAG primary open angle glaucoma; EBMD epithelial/anterior basement membrane dystrophy; ACIOL anterior chamber intraocular lens; IOL intraocular lens; PCIOL posterior chamber intraocular lens; Phaco/IOL phacoemulsification with intraocular lens placement; [place] photorefractive keratectomy; LASIK laser assisted in situ keratomileusis; HTN hypertension; DM diabetes mellitus; COPD chronic obstructive pulmonary disease

## 2020-09-23 NOTE — Assessment & Plan Note (Signed)

## 2020-09-23 NOTE — Assessment & Plan Note (Signed)
I discussed with the patient at length the possibility of vitrectomy, it would require also the removal of this lens in the bag and placement of his suspended 3 piece intraocular lens using the Yamani scleral tunnel technique.  I explained that the current capsular bag out zonular support is not usable in a safe fashion.  I also explained that the surgical procedure took about 30 minutes in an outpatient surgical setting and would afford stability of vision but still may require glasses prescriptions thereafter.  Moreover sometimes chronic use of topical medication such as a topical nonsteroidal anti-inflammatory agent such as ketorolac once or twice a day may be required long-term because of the nature of the use of a 3 piece lens with scleral tunnel fixation

## 2020-10-01 ENCOUNTER — Ambulatory Visit: Payer: Medicare Other | Admitting: Dietician

## 2020-10-04 ENCOUNTER — Encounter: Payer: Self-pay | Admitting: Family Medicine

## 2020-10-04 ENCOUNTER — Ambulatory Visit (INDEPENDENT_AMBULATORY_CARE_PROVIDER_SITE_OTHER): Payer: Medicare Other | Admitting: Family Medicine

## 2020-10-04 ENCOUNTER — Other Ambulatory Visit: Payer: Self-pay

## 2020-10-04 VITALS — BP 110/64 | HR 75 | Temp 97.5°F | Resp 16 | Ht 71.0 in | Wt 240.0 lb

## 2020-10-04 DIAGNOSIS — I2510 Atherosclerotic heart disease of native coronary artery without angina pectoris: Secondary | ICD-10-CM

## 2020-10-04 DIAGNOSIS — E7800 Pure hypercholesterolemia, unspecified: Secondary | ICD-10-CM | POA: Diagnosis not present

## 2020-10-04 DIAGNOSIS — I495 Sick sinus syndrome: Secondary | ICD-10-CM

## 2020-10-04 DIAGNOSIS — Z7901 Long term (current) use of anticoagulants: Secondary | ICD-10-CM

## 2020-10-04 DIAGNOSIS — I5042 Chronic combined systolic (congestive) and diastolic (congestive) heart failure: Secondary | ICD-10-CM

## 2020-10-04 DIAGNOSIS — N1831 Chronic kidney disease, stage 3a: Secondary | ICD-10-CM | POA: Diagnosis not present

## 2020-10-04 LAB — CBC
HCT: 48.8 % (ref 39.0–52.0)
Hemoglobin: 16.9 g/dL (ref 13.0–17.0)
MCHC: 34.7 g/dL (ref 30.0–36.0)
MCV: 89.3 fl (ref 78.0–100.0)
Platelets: 146 10*3/uL — ABNORMAL LOW (ref 150.0–400.0)
RBC: 5.46 Mil/uL (ref 4.22–5.81)
RDW: 13.7 % (ref 11.5–15.5)
WBC: 5.8 10*3/uL (ref 4.0–10.5)

## 2020-10-04 LAB — COMPREHENSIVE METABOLIC PANEL
ALT: 12 U/L (ref 0–53)
AST: 15 U/L (ref 0–37)
Albumin: 4 g/dL (ref 3.5–5.2)
Alkaline Phosphatase: 84 U/L (ref 39–117)
BUN: 23 mg/dL (ref 6–23)
CO2: 23 mEq/L (ref 19–32)
Calcium: 9.3 mg/dL (ref 8.4–10.5)
Chloride: 103 mEq/L (ref 96–112)
Creatinine, Ser: 1.34 mg/dL (ref 0.40–1.50)
GFR: 50.85 mL/min — ABNORMAL LOW (ref >60.00)
Glucose, Bld: 207 mg/dL — ABNORMAL HIGH (ref 70–99)
Potassium: 4.6 mEq/L (ref 3.5–5.1)
Sodium: 136 mEq/L (ref 135–145)
Total Bilirubin: 0.5 mg/dL (ref 0.2–1.2)
Total Protein: 7.3 g/dL (ref 6.0–8.3)

## 2020-10-04 NOTE — Progress Notes (Signed)
OFFICE VISIT  10/06/2020  CC:  Chief Complaint  Patient presents with  . Follow-up    RCI, 3 mo. Pt is not fasting    HPI:    Patient is a 80 y.o. Caucasian male who presents for 3 mo f/u HTN, HLD, CRI III. He has nonischemic CM (syst and diast dysfxn) and has CRT-D, also chronic a-fib and complete heart block (pacer dependent),andconsistently has NYHA class II/III sx's. Most recent cardiology f/u 06/14/20: all stable, remains on max medical mgmt + CRT-D. A/P as of last visit: "1) DM 2, poor control. Noncompliant with diet.  Exercise is minimal. Compliant with meds.  Unfortunately he is vague with report of home gluc monitoring numbers. Current regimen of lantus 56 U qd and novolog 16 U qAC plus jardiance 10mg  qd. He expressed interest in seeing an endocrinologist today so I have made this referral. No treatment regimen changes at this time. Hba1c today. Feet exam today is remarkable only for onychomycosis.  2) Ischemic CM, chronic a-fib, complete HB: stable on max medical management + CRT.   Continue all current meds and check lytes/cr and cbc today.  3) HLD/ischemic heart dz: tolerating simva 20mg  qd. LDL was near goal 3 mo ago (85). FLP and hepatic panel today.  Consider switch to atorva or rosuva.  4) CRI III: he avoids NSAIDs.   Hydrates fairly well. Lytes/cr today."  INTERIM HX: Feeling fine, no acute complaints.  DM: since last visit his DM management has been officially taken over by Dr. Cruzita Lederer in endocrinology.  Has has seen her twice now.  She put him on trulicity and kept him on lantus, novolog, and jardiance.  His a1c about 3 wks ago was down to 7.6%!!  HLD: Dr. Cruzita Lederer switched him from zocor to crestor 1 mo ago for a more robust effect and better CV protection.  He is finishing out the leftover zocor now, will start the crestor soon.  CRI: he estimates he drinks 24 ounces of water daily.  Occ tea.  1-2 cups coffee/day.  Milk in cereal daily. Gets winded  with moderate exertion, goes back to normal quickly with rest.  HTN/CV:  No home bp monitoring.  ROS as above, plus--> no fevers, no CP, no SOB, no wheezing, no cough, no dizziness, no HAs, no rashes, no melena/hematochezia.  No polyuria or polydipsia.  No myalgias or arthralgias.  No focal weakness, paresthesias, or tremors.  No acute vision or hearing abnormalities.  No dysuria or unusual/new urinary urgency or frequency.  No recent changes in lower legs. No n/v/d or abd pain.  No palpitations.      Past Medical History:  Diagnosis Date  . AICD (automatic cardioverter/defibrillator) present 2018   MDT CRT-D.  Fatigue-->completely pacer dependent.  Pacer settings adjusted 12/2017 to allow more chronotrophc variance with ADL's//exertion.  . Arthritis    Pt is s/p eft reverse total shoulder arthroplasty.  . Balanitis    chronic fungal  . BPH (benign prostatic hypertrophy) 06/2011   Irritative sx's; pt declined trial of anticholinergic per Urology records  . Chronic combined systolic and diastolic heart failure ([place]) 05/31/2012   Nonischemic:  EF 40-45%, LA mod-severe dilated, AFIB.   02/2016 EF 40%, diffuse hypokinesis, grade 2 DD.  Myoc perf imaging showed EF 32% 04/2016.  Pt upgraded to CRT-D 01/04/17.  [name] Chronic renal insufficiency, stage III (moderate) (HCC) 2015   CrCl about 60 ml/min  . Complete heart block (HCC)    Has dual chamber pacer.  [name]  Depression   . DOE (dyspnea on exertion)    NYHA class II/III CHF  . Dyspnea 2021   with exertion, bending over  . Episodic low back pain 01/22/2013   w/intermittent radiculitis (12/2014 his neurologist referred him to pain mgmt for epidural steroid injection)  . Erectile dysfunction 2019   due to zoloft--urol rx'd viagra  . GERD (gastroesophageal reflux disease)   . H/O tilt table evaluation 11/02/05   negative  . Helicobacter pylori gastritis 01/2016  . History of adenomatous polyp of colon 10/12/11   Dr. [name] (3 right side of colon-  tubular adenomas removed)  . History of cardiovascular stress test 05/28/12   no ischemia, EF 37%, imaging results are unchanged and within normal variance  . History of chronic prostatitis   . History of kidney stones   . History of vertigo    + Hx of posterior HA's.  Neuro (Dr. Erling Cruz) eval 2011.  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  . Hyperlipidemia   . Hypertension   . Lumbar spondylosis    lumbosacral radiculopathy at L4 by EMG testing, right foot drop (neurologist is Dr. Linus Salmons with [facility] in [place])--neurologist referred him to neurosurgery  . Migraine    "used to have them all the time; none for years" (01/04/2017)  . Myocardial infarction [facility]) ?1970s   not entirely certain of this  . Nephrolithiasis 07/2012   Left UVJ 2 mm stone with dilation of renal collecting system and slight hydroureter on right  . Neuropathy   . NICM (nonischemic cardiomyopathy) ([place])    a. 02/2018 Cath: LM nl, LAD min irregs, LCX no, RCA 20d. [id number]. Fick CO/CI 4.4/2.0.  . 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  Since no CAD on cath 05/2016, cards recommends upgrade to CRT-D.  [name] Permanent atrial fibrillation ([place])    DCCV 07/09/13-converted, lasted two days, then back into afib--needs lifetime anticoagulation (Xarelto as of 09/2014)  . Prostate cancer screening 09/2017   done by urol annually (normal prostate exam documented + PSA 0.84 as of 10/01/17 urol f/u.  10/2018 urol f/u PSA 0.6, no prostate nodule.  . Rectus diastasis   . Right ankle sprain 08/2017   w/distal fibula avulsion fx noted on u/s but not plain film-(Dr. Hudnall).  . Skin cancer of arm, left    "burned it off" (01/04/2017)  . TIA (transient ischemic attack)    L face and L arm weakness. Peri procedural->a. 03/22/2018 following cath. CT head neg. No MRI b/c has pacer. Likely due to  embolus to distal branch of RMCA  . Type II diabetes mellitus ([place])     Past Surgical History:  Procedure Laterality Date  . ABI's Bilateral 05/21/2018   normal  . BACK SURGERY    . BIV ICD INSERTION CRT-D N/A 01/04/2017   Procedure: BiV ICD ;  Surgeon: Constance Haw, MD;  Location: [place] CV LAB;  Service: Cardiovascular;  Laterality: N/A;  . CARDIAC CATHETERIZATION N/A 06/14/2016   Minimal nonobstructive dz, EF 25-35%.  Procedure: Left Heart Cath and Coronary Angiography;  Surgeon: Peter M Martinique, MD;  Location: [place] CV LAB;  Service: Cardiovascular;  Laterality: N/A;  . CARDIOVASCULAR STRESS TEST  2012   2012 nuclear perfusion study: low risk scan; 04/2016 normal myocardial perfusion imaging, EF 32%.  [name] CARDIOVERSION  07/09/2012   Procedure: CARDIOVERSION;  Surgeon: [name], MD;  Location: [facility]  ENDOSCOPY;  Service: Cardiovascular;  Laterality: N/A;  . CATARACT EXTRACTION W/ INTRAOCULAR LENS IMPLANT & ANTERIOR VITRECTOMY, BILATERAL Bilateral   . CIRCUMCISION N/A 03/23/2020   Procedure: CIRCUMCISION ADULT;  Surgeon: Remi Haggard, MD;  Location: [facility] ORS;  Service: Urology;  Laterality: N/A;  . COLONOSCOPY W/ POLYPECTOMY  approx 2006; repeated 09/2011   Polyps on 2013 EGD as well, repeat 12/2014  . ESOPHAGOGASTRODUODENOSCOPY  10/18/06   Done due to chronic GERD: Normal, bx showed no barrett's esophagus (Dr. [name])  . FLEXOR TENDON REPAIR Left 10/02/2016   Procedure: LEFT RING FINGER WOUND EXPORATION AND FLEXOR TENDON REPAIR AND NERVE REPAIR;  Surgeon: Milly Jakob, MD;  Location: [place];  Service: Orthopedics;  Laterality: Left;  . INSERT / REPLACE / REMOVE PACEMAKER  02/05/2012   dual chamber, sinus node dysfunction, sinus arrest, PAF, Medtronic Revo serial#-[id number] H: last checked 05/2015  . LUMBAR LAMINECTOMY Left 1976   L4-5  . PACEMAKER REMOVAL  01/04/2017  . PERMANENT PACEMAKER INSERTION N/A 02/05/2012   Procedure: PERMANENT PACEMAKER INSERTION;  Surgeon:  [name], MD; Generator Medtronic Revo model [place] serial number [id number] H Laterality: N/A;  . RETINAL DETACHMENT SURGERY Left ~ 1999  . REVERSE SHOULDER ARTHROPLASTY Left 2018   Left shoulder reverse [name] in [place]).  [name] RIGHT/LEFT HEART CATH AND CORONARY ANGIOGRAPHY N/A 03/22/2018   EF 30-35%, no CAD.  Procedure: RIGHT/LEFT HEART CATH AND CORONARY ANGIOGRAPHY;  Surgeon: Jolaine Artist, MD;  Location: [place] CV LAB;  Service: Cardiovascular;  Laterality: N/A;  . TRANSTHORACIC ECHOCARDIOGRAM  08/25/10; 05/2012; 03/23/16;12/2017   mild asymmetric LVH, normal systolic function, normal diastolic fxn, mild-to-mod mitral regurg, mild aortic valve sclerosis and trace AI, mild aortic root dilatation. 2014 f/u showed EF 40-45%, mod LAE, A FIB.  02/2016 EF 40%, diffuse hypokinesis, grade 2 DD. 12/2017 EF 35-40%,diffuse hypokin,grd III DD, mild MR  . WISDOM TOOTH EXTRACTION      Outpatient Medications Prior to Visit  Medication Sig Dispense Refill  . acetaminophen (TYLENOL) 325 MG tablet Take 650 mg by mouth every 6 (six) hours as needed for moderate pain.    . BD INSULIN SYRINGE U/F 31G X 5/16" 1 ML MISC USE DAILY AS DIRECTED 100 each 3  . COMBIGAN 0.2-0.5 % ophthalmic solution Place 1 drop into both eyes at bedtime.     . Continuous Blood Gluc Sensor (FREESTYLE LIBRE 14 DAY SENSOR) MISC Check glucose 4 times per day 6 each 3  . Continuous Blood Gluc Sensor (FREESTYLE LIBRE SENSOR SYSTEM) MISC USE TO CHECK BLOOD SUGARS 4 TIMES DAILY. Before breakfast, before lunch, before supper, and at bedtime 1 each 3  . Dulaglutide (TRULICITY) 1.5 GH/8.2XH SOPN Inject 1.5 mg into the skin once a week. 6 mL 2  . ENTRESTO 97-103 MG TAKE 1 TABLET TWO TIMES A DAY 180 tablet 3  . eplerenone (INSPRA) 25 MG tablet TAKE 1 TABLET DAILY 90 tablet 3  . gabapentin (NEURONTIN) 300 MG capsule 2 caps po bid (Patient taking differently: Take 300 mg by mouth in the morning and at bedtime.) 360 capsule 3   . insulin aspart (NOVOLOG FLEXPEN) 100 UNIT/ML FlexPen INJECT 16 UNITS UNDER THE SKIN AT THE TIME OF EACH MEAL (Patient taking differently: Pt taking 12 units in the morning 8 units with light lunch or snack 10 units with dinner.) 15 mL 4  . insulin glargine (LANTUS) 100 UNIT/ML injection INJECT 60 UNITS UNDER THE SKIN DAILY. PATIENT WILL BE TITRATING HIS DOSE PERIODICALLY (Patient taking differently:  INJECT 60 UNITS UNDER THE SKIN DAILY. PATIENT WILL BE TITRATING HIS DOSE PERIODICALLY) 10 mL 0  . Insulin Pen Needle (PEN NEEDLES) 31G X 5 MM MISC USE TO INJECT INSULIN SQ. 100 each 2  . JARDIANCE 10 MG TABS tablet TAKE 1 TABLET DAILY 90 tablet 3  . latanoprost (XALATAN) 0.005 % ophthalmic solution Place 1 drop into both eyes at bedtime.   12  . meclizine (ANTIVERT) 25 MG tablet TAKE 1 TABLET THREE TIMES A DAY AS NEEDED FOR DIZZINESS 270 tablet 3  . metoprolol succinate (TOPROL-XL) 50 MG 24 hr tablet TAKE 1 TABLET DAILY. TAKE WITH OR IMMEDIATELY FOLLOWING A MEAL (Patient taking differently: Take by mouth daily. Pt taking 1/2 tablet (25 mg) daily) 90 tablet 0  . omeprazole (PRILOSEC) 40 MG capsule Take 1 capsule (40 mg total) by mouth daily. 90 capsule 1  . oxybutynin (DITROPAN-XL) 5 MG 24 hr tablet TAKE 1 TABLET AT BEDTIME 90 tablet 3  . rOPINIRole (REQUIP) 1 MG tablet TAKE 1 TABLET DAILY 90 tablet 3  . sertraline (ZOLOFT) 100 MG tablet TAKE 1 TABLET DAILY 90 tablet 0  . triamcinolone (KENALOG) 0.1 % SMARTSIG:1 Application Topical 2-3 Times Daily    . XARELTO 20 MG TABS tablet TAKE 1 TABLET DAILY WITH SUPPER (Patient taking differently: Take 20 mg by mouth daily with supper.) 90 tablet 3  . glucose blood test strip Use to check blood sugars 1-2 times daily (Patient not taking: Reported on 10/04/2020) 100 each 3  . Lancets (FREESTYLE) lancets USE AS INSTRUCTED (Patient not taking: Reported on 10/04/2020) 100 each 11  . oxyCODONE-acetaminophen (PERCOCET) 5-325 MG tablet Take 1 tablet by mouth every 4  (four) hours as needed for severe pain. (Patient not taking: Reported on 10/04/2020) 20 tablet 0  . rosuvastatin (CRESTOR) 20 MG tablet Take 1 tablet (20 mg total) by mouth daily. (Patient not taking: Reported on 10/04/2020) 90 tablet 3   No facility-administered medications prior to visit.    No Known Allergies  ROS As per HPI  PE: Vitals with BMI 10/04/2020 09/09/2020 09/03/2020  Height 5\' 11"  5\' 11"  -  Weight 240 lbs 243 lbs 6 oz 247 lbs  BMI 33.49 76.19 50.93  Systolic 267 124 -  Diastolic 64 80 -  Pulse 75 74 -     Gen: Alert, well appearing.  Patient is oriented to person, place, time, and situation. AFFECT: pleasant, lucid thought and speech. CV: RRR, no m/r/g.   LUNGS: CTA bilat, nonlabored resps, good aeration in all lung fields. ABD: soft, nt/nd EXT: no clubbing or cyanosis.  no edema.   LABS:    Chemistry      Component Value Date/Time   NA 136 10/04/2020 1412   NA 139 12/28/2016 1405   K 4.6 10/04/2020 1412   CL 103 10/04/2020 1412   CO2 23 10/04/2020 1412   BUN 23 10/04/2020 1412   BUN 25 12/28/2016 1405   CREATININE 1.34 10/04/2020 1412   CREATININE 1.21 (H) 06/06/2016 1312      Component Value Date/Time   CALCIUM 9.3 10/04/2020 1412   ALKPHOS 84 10/04/2020 1412   AST 15 10/04/2020 1412   ALT 12 10/04/2020 1412   BILITOT 0.5 10/04/2020 1412     Lab Results  Component Value Date   CHOL 168 06/22/2020   HDL 38.80 (L) 06/22/2020   LDLCALC 85 03/16/2020   LDLDIRECT 103.0 06/22/2020   TRIG 215.0 (H) 06/22/2020   CHOLHDL 4 06/22/2020   Lab Results  Component Value Date   WBC 5.8 10/04/2020   HGB 16.9 10/04/2020   HCT 48.8 10/04/2020   MCV 89.3 10/04/2020   PLT 146.0 (L) 10/04/2020   Lab Results  Component Value Date   TSH 2.963 04/04/2018   Lab Results  Component Value Date   HGBA1C 7.6 (A) 09/09/2020    IMPRESSION AND PLAN:  1) HTN: bp normal.  Cont entresto, espleronone, and toprol. Lytes/cr today.  2) HLD: tolerating simva long  term. When finishes current supply of this med he'll switch over to rosuvastatin 20mg  qd that Dr. Cruzita Lederer rx'd him 58mo ago for more robust effect and possibly better cv protection.   3) CRI III: fluid intake appropriate (CHF), avoids NSAIDs. BMET today.  4) Ischemic CM, chronic a-fib, complete HB: stable on max medical management + CRT.   Continue all current meds and check lytes/cr and cbc today.  5) DM 2: per Dr. Crisoforo [place]!  An After Visit Summary was printed and given to the patient.  FOLLOW UP: Return in about 3 months (around 01/04/2021) for routine chronic illness f/u.  Signed:  Crissie Sickles, MD           10/06/2020

## 2020-10-05 DIAGNOSIS — H401123 Primary open-angle glaucoma, left eye, severe stage: Secondary | ICD-10-CM | POA: Diagnosis not present

## 2020-10-05 DIAGNOSIS — H401112 Primary open-angle glaucoma, right eye, moderate stage: Secondary | ICD-10-CM | POA: Diagnosis not present

## 2020-10-13 ENCOUNTER — Telehealth: Payer: Self-pay | Admitting: Internal Medicine

## 2020-10-13 ENCOUNTER — Other Ambulatory Visit: Payer: Self-pay

## 2020-10-13 ENCOUNTER — Other Ambulatory Visit: Payer: Self-pay | Admitting: Family Medicine

## 2020-10-13 MED ORDER — TRULICITY 1.5 MG/0.5ML ~~LOC~~ SOAJ: 1.5000 mg | SUBCUTANEOUS | 2 refills | Status: DC

## 2020-10-13 NOTE — Telephone Encounter (Signed)
MEDICATION:  Dulaglutide (TRULICITY) 1.5 UO/3.7GB SOPN  PHARMACY:    [id number] - [place], [place] - [address] [place] [address] Phone:  [phone]  Fax:  [phone]      HAS THE PATIENT CONTACTED THEIR PHARMACY? No    IS THIS A 90 DAY SUPPLY :  No but Patient would prefer 90 day supply  IS PATIENT OUT OF MEDICATION: Yes  IF NOT; HOW MUCH IS LEFT: 0  LAST APPOINTMENT DATE: @2 /17/2022  NEXT APPOINTMENT DATE:@5 /19/2022  DO WE HAVE YOUR PERMISSION TO LEAVE A DETAILED MESSAGE?: Yes  OTHER COMMENTS:    **Let patient know to contact pharmacy at the end of the day to make sure medication is ready. **  ** Please notify patient to allow 48-72 hours to process**  **Encourage patient to contact the pharmacy for refills or they can request refills through [facility]**

## 2020-10-27 ENCOUNTER — Ambulatory Visit (INDEPENDENT_AMBULATORY_CARE_PROVIDER_SITE_OTHER): Payer: Medicare Other

## 2020-10-27 DIAGNOSIS — I428 Other cardiomyopathies: Secondary | ICD-10-CM | POA: Diagnosis not present

## 2020-10-28 LAB — CUP PACEART REMOTE DEVICE CHECK
Battery Remaining Longevity: 32 mo
Battery Voltage: 2.96 V
Brady Statistic AP VP Percent: 0 %
Brady Statistic AP VS Percent: 0 %
Brady Statistic AS VP Percent: 0 %
Brady Statistic AS VS Percent: 0 %
Brady Statistic RA Percent Paced: 0 %
Brady Statistic RV Percent Paced: 99.97 %
Date Time Interrogation Session: 20220405012404
HighPow Impedance: 74 Ohm
Implantable Lead Implant Date: 20130715
Implantable Lead Implant Date: 20180614
Implantable Lead Implant Date: 20180614
Implantable Lead Location: 753858
Implantable Lead Location: 753859
Implantable Lead Location: 753860
Implantable Lead Model: 4598
Implantable Pulse Generator Implant Date: 20180614
Lead Channel Impedance Value: 176 Ohm
Lead Channel Impedance Value: 176 Ohm
Lead Channel Impedance Value: 190.884
Lead Channel Impedance Value: 230.327
Lead Channel Impedance Value: 230.327
Lead Channel Impedance Value: 304 Ohm
Lead Channel Impedance Value: 361 Ohm
Lead Channel Impedance Value: 418 Ohm
Lead Channel Impedance Value: 418 Ohm
Lead Channel Impedance Value: 513 Ohm
Lead Channel Impedance Value: 513 Ohm
Lead Channel Impedance Value: 532 Ohm
Lead Channel Impedance Value: 532 Ohm
Lead Channel Impedance Value: 608 Ohm
Lead Channel Impedance Value: 646 Ohm
Lead Channel Impedance Value: 722 Ohm
Lead Channel Impedance Value: 779 Ohm
Lead Channel Impedance Value: 817 Ohm
Lead Channel Pacing Threshold Amplitude: 0.5 V
Lead Channel Pacing Threshold Amplitude: 0.625 V
Lead Channel Pacing Threshold Pulse Width: 0.4 ms
Lead Channel Pacing Threshold Pulse Width: 1 ms
Lead Channel Sensing Intrinsic Amplitude: 0.625 mV
Lead Channel Sensing Intrinsic Amplitude: 20.5 mV
Lead Channel Sensing Intrinsic Amplitude: 20.5 mV
Lead Channel Setting Pacing Amplitude: 1 V
Lead Channel Setting Pacing Amplitude: 2 V
Lead Channel Setting Pacing Pulse Width: 0.4 ms
Lead Channel Setting Pacing Pulse Width: 1 ms
Lead Channel Setting Sensing Sensitivity: 0.3 mV

## 2020-11-08 NOTE — Progress Notes (Signed)
Remote ICD transmission.   

## 2020-12-02 ENCOUNTER — Other Ambulatory Visit: Payer: Self-pay | Admitting: Family Medicine

## 2020-12-02 DIAGNOSIS — E1165 Type 2 diabetes mellitus with hyperglycemia: Secondary | ICD-10-CM

## 2020-12-02 DIAGNOSIS — IMO0002 Reserved for concepts with insufficient information to code with codable children: Secondary | ICD-10-CM

## 2020-12-09 ENCOUNTER — Encounter: Payer: Self-pay | Admitting: Internal Medicine

## 2020-12-09 ENCOUNTER — Other Ambulatory Visit: Payer: Self-pay

## 2020-12-09 ENCOUNTER — Ambulatory Visit (INDEPENDENT_AMBULATORY_CARE_PROVIDER_SITE_OTHER): Payer: Medicare Other | Admitting: Internal Medicine

## 2020-12-09 VITALS — BP 120/82 | HR 83 | Ht 71.0 in | Wt 238.0 lb

## 2020-12-09 DIAGNOSIS — E1159 Type 2 diabetes mellitus with other circulatory complications: Secondary | ICD-10-CM

## 2020-12-09 DIAGNOSIS — E785 Hyperlipidemia, unspecified: Secondary | ICD-10-CM

## 2020-12-09 DIAGNOSIS — E118 Type 2 diabetes mellitus with unspecified complications: Secondary | ICD-10-CM | POA: Diagnosis not present

## 2020-12-09 DIAGNOSIS — I2510 Atherosclerotic heart disease of native coronary artery without angina pectoris: Secondary | ICD-10-CM

## 2020-12-09 DIAGNOSIS — E1165 Type 2 diabetes mellitus with hyperglycemia: Secondary | ICD-10-CM

## 2020-12-09 DIAGNOSIS — IMO0002 Reserved for concepts with insufficient information to code with codable children: Secondary | ICD-10-CM

## 2020-12-09 LAB — POCT GLYCOSYLATED HEMOGLOBIN (HGB A1C): Hemoglobin A1C: 6.7 % — AB (ref 4.0–5.6)

## 2020-12-09 MED ORDER — NOVOLOG FLEXPEN 100 UNIT/ML ~~LOC~~ SOPN: PEN_INJECTOR | SUBCUTANEOUS | 4 refills | Status: DC

## 2020-12-09 MED ORDER — TRULICITY 3 MG/0.5ML ~~LOC~~ SOAJ: 3.0000 mg | SUBCUTANEOUS | 3 refills | Status: DC

## 2020-12-09 MED ORDER — INSULIN GLARGINE 100 UNIT/ML ~~LOC~~ SOLN: 36.0000 [IU] | Freq: Every day | SUBCUTANEOUS | 3 refills | Status: DC

## 2020-12-09 NOTE — Progress Notes (Signed)
Patient ID: Mason [name], male   DOB: February 24, 1941, 80 y.o.   MRN: [id number]   This visit occurred during the SARS-CoV-2 public health emergency.  Safety protocols were in place, including screening questions prior to the visit, additional usage of staff PPE, and extensive cleaning of exam room while observing appropriate contact time as indicated for disinfecting solutions.   HPI: Mason [name] is a 80 y.o.-year-old male, initially referred by his PCP, Dr. Anitra Lauth, returning for follow-up for DM2, dx in ~2011, insulin-dependent since 2019, uncontrolled, with long-term complications (CAD with ?h/o AMI, nonischemic CMP, CHF, Afib, cerebrovascular disease with h/o TIA, CKD stage III, peripheral neuropathy, ED).  Last visit 3 months ago. He is here with his wife.  Interim history: No increased urination, blurry vision, nausea.  Reviewed HbA1c levels: Lab Results  Component Value Date   HGBA1C 7.6 (A) 09/09/2020   HGBA1C 9.7 (H) 06/22/2020   HGBA1C 9.0 (H) 03/23/2020   HGBA1C 9.2 (H) 03/16/2020   HGBA1C 9.9 (H) 11/20/2019   HGBA1C 9.7 (H) 05/27/2019   HGBA1C 9.0 (H) 02/26/2019   HGBA1C 10.1 (H) 11/29/2018   HGBA1C 9.0 (A) 08/29/2018   HGBA1C 9.0 08/29/2018   HGBA1C 9.0 (A) 08/29/2018   HGBA1C 9.0 (A) 08/29/2018   HGBA1C 7.8 (A) 05/14/2018   HGBA1C 8.1 (H) 02/06/2018   HGBA1C 8.0 (H) 10/26/2017   HGBA1C 8.2 (H) 07/27/2017   HGBA1C 7.0 03/01/2017   HGBA1C 7.1 11/16/2016   HGBA1C 7.8 (H) 07/03/2016   HGBA1C 6.8 03/14/2016   HGBA1C 7.0 (H) 12/09/2015   HGBA1C 7.4 (H) 09/20/2015   HGBA1C 7.2 (H) 03/25/2015   HGBA1C 7.5 (H) 10/15/2014   HGBA1C 7.1 (H) 06/15/2014   HGBA1C 6.9 (H) 02/12/2014   HGBA1C 8.6 (H) 10/14/2013   HGBA1C 7.5 (H) 07/15/2013   HGBA1C 6.7 (H) 01/03/2013   She was initially on: - Jardiance 10 mg before or after breakfast >> frequent urination and nocturia 4x a night - Lantus 58  units at bedtime - Novolog 18 units 2x a day, before or after meals He was on  Metformin and Glipizide in the past.  At last visit he was on: - Jardiance 10 mg before breakfast - Trulicity [id number] mg weekly - Lantus 58 >> however, he is using 52  units at bedtime - Novolog 12-20 units 3x a day, 15 min before meals >> however, he is using b-l-d: 06-30-09 units  We changed to the following regimen: - Jardiance 10 mg before breakfast - Trulicity 1.5 mg weekly - NovoLog: 12 (-10-10) units before B-L-D. - Lantus 44 >> 40 units at bedtime  Pt checks his sugars more than 4 times a day with his freestyle libre CGM:   Previously:   Lowest sugar was: 45 >> 70; he has hypoglycemia awareness at 70.  Highest sugar was 400 >> 200s.  Glucometer: Freestyle  Pt's meals are: - Breakfast: cereals, eggs, grits, toast - Lunch: may skip or sandwich - Dinner: meat + veggies + starch - Snacks: apple/cheese Patient saw nutrition a long time ago.  -+ Stage III CKD, last BUN/creatinine:  Lab Results  Component Value Date   BUN 23 10/04/2020   BUN 28 (H) 06/22/2020   CREATININE 1.34 10/04/2020   CREATININE 1.57 (H) 06/22/2020  On Entresto.  -+ HL; last set of lipids: Lab Results  Component Value Date   CHOL 168 06/22/2020   HDL 38.80 (L) 06/22/2020   LDLCALC 85 03/16/2020   LDLDIRECT 103.0 06/22/2020   TRIG 215.0 (  H) 06/22/2020   CHOLHDL 4 06/22/2020  Previously on Zocor 20, but I suggested to change to Crestor 20 mg daily 08/2020 -he is not taking this...  - last eye exam was in 09/2020 (Dr. Zadie Rhine): No DR, + glaucoma.  -He has numbness and tingling in his feet.  On Neurontin 300 mg twice a day.  No known FH of DM.  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  Drinks beer 2-3x a day, but not quite every day.  ROS: Constitutional: no weight gain/no weight loss, + fatigue, no subjective hyperthermia, no subjective hypothermia Eyes: no blurry vision, no xerophthalmia ENT: no  sore throat, no nodules palpated in neck, no dysphagia, no odynophagia, no hoarseness Cardiovascular: no CP/+ SOB/no palpitations/no leg swelling Respiratory: no cough/+ SOB/no wheezing Gastrointestinal: no N/no V/no D/no C/no acid reflux Musculoskeletal: no muscle aches/no joint aches Skin: no rashes Neurological: no tremors/+ numbness/+ tingling/no dizziness  I reviewed pt's medications, allergies, PMH, social hx, family hx, and changes were documented in the history of present illness. Otherwise, unchanged from my initial visit note.  Past Medical History:  Diagnosis Date  . AICD (automatic cardioverter/defibrillator) present 2018   MDT CRT-D.  Fatigue-->completely pacer dependent.  Pacer settings adjusted 12/2017 to allow more chronotrophc variance with ADL's//exertion.  . Arthritis    Pt is s/p eft reverse total shoulder arthroplasty.  . Balanitis    chronic fungal  . BPH (benign prostatic hypertrophy) 06/2011   Irritative sx's; pt declined trial of anticholinergic per Urology records  . Chronic combined systolic and diastolic heart failure ([place]) 05/31/2012   Nonischemic:  EF 40-45%, LA mod-severe dilated, AFIB.   02/2016 EF 40%, diffuse hypokinesis, grade 2 DD.  Myoc perf imaging showed EF 32% 04/2016.  Pt upgraded to CRT-D 01/04/17.  [name] Chronic renal insufficiency, stage III (moderate) (HCC) 2015   CrCl about 60 ml/min  . Complete heart block (HCC)    Has dual chamber pacer.  . Depression   . DOE (dyspnea on exertion)    NYHA class II/III CHF  . Dyspnea 2021   with exertion, bending over  . Episodic low back pain 01/22/2013   w/intermittent radiculitis (12/2014 his neurologist referred him to pain mgmt for epidural steroid injection)  . Erectile dysfunction 2019   due to zoloft--urol rx'd viagra  . GERD (gastroesophageal reflux disease)   . H/O tilt table evaluation 11/02/05   negative  . Helicobacter pylori gastritis 01/2016  . History of adenomatous polyp of colon 10/12/11    Dr. [name] (3 right side of colon- tubular adenomas removed)  . History of cardiovascular stress test 05/28/12   no ischemia, EF 37%, imaging results are unchanged and within normal variance  . History of chronic prostatitis   . History of kidney stones   . History of vertigo    + Hx of posterior HA's.  Neuro (Dr. Erling Cruz) eval 2011.  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  . Hyperlipidemia   . Hypertension   . Lumbar spondylosis    lumbosacral radiculopathy at L4 by EMG testing, right foot drop (neurologist is Dr. Linus Salmons with [facility] in [place])--neurologist referred him to neurosurgery  . Migraine    "used to have them all the time; none for years" (01/04/2017)  . Myocardial infarction [facility]) ?1970s   not entirely certain of this  . Nephrolithiasis 07/2012   Left  UVJ 2 mm stone with dilation of renal collecting system and slight hydroureter on right  . Neuropathy   . NICM (nonischemic cardiomyopathy) ([place])    a. 02/2018 Cath: LM nl, LAD min irregs, LCX no, RCA 20d. [place]:[id number]. Fick CO/CI 4.4/2.0.  . 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  Since no CAD on cath 05/2016, cards recommends upgrade to CRT-D.  [name] Permanent atrial fibrillation ([place])    DCCV 07/09/13-converted, lasted two days, then back into afib--needs lifetime anticoagulation (Xarelto as of 09/2014)  . Prostate cancer screening 09/2017   done by urol annually (normal prostate exam documented + PSA 0.84 as of 10/01/17 urol f/u.  10/2018 urol f/u PSA 0.6, no prostate nodule.  . Rectus diastasis   . Right ankle sprain 08/2017   w/distal fibula avulsion fx noted on u/s but not plain film-(Dr. Hudnall).  . Skin cancer of arm, left    "burned it off" (01/04/2017)  . TIA (transient ischemic attack)    L face and L arm weakness. Peri procedural->a. 03/22/2018 following cath. CT head neg. No MRI  b/c has pacer. Likely due to embolus to distal branch of RMCA  . Type II diabetes mellitus ([place])    Past Surgical History:  Procedure Laterality Date  . ABI's Bilateral 05/21/2018   normal  . BACK SURGERY    . BIV ICD INSERTION CRT-D N/A 01/04/2017   Procedure: BiV ICD ;  Surgeon: Constance Haw, MD;  Location: [place];  Service: Cardiovascular;  Laterality: N/A;  . CARDIAC CATHETERIZATION N/A 06/14/2016   Minimal nonobstructive dz, EF 25-35%.  Procedure: Left Heart Cath and Coronary Angiography;  Surgeon: Peter M Martinique, MD;  Location: [place] [facility];  Service: Cardiovascular;  Laterality: N/A;  . CARDIOVASCULAR STRESS TEST  2012   2012 nuclear perfusion study: low risk scan; 04/2016 normal myocardial perfusion imaging, EF 32%.  [name] CARDIOVERSION  07/09/2012   Procedure: CARDIOVERSION;  Surgeon: [name], MD;  Location: [place] [facility];  Service: Cardiovascular;  Laterality: N/A;  . CATARACT EXTRACTION W/ INTRAOCULAR LENS IMPLANT & ANTERIOR VITRECTOMY, BILATERAL Bilateral   . CIRCUMCISION N/A 03/23/2020   Procedure: CIRCUMCISION ADULT;  Surgeon: Remi Haggard, MD;  Location: [facility];  Service: Urology;  Laterality: N/A;  . COLONOSCOPY W/ POLYPECTOMY  approx 2006; repeated 09/2011   Polyps on 2013 EGD as well, repeat 12/2014  . ESOPHAGOGASTRODUODENOSCOPY  10/18/06   Done due to chronic GERD: Normal, bx showed no barrett's esophagus (Dr. [name])  . FLEXOR TENDON REPAIR Left 10/02/2016   Procedure: LEFT RING FINGER WOUND EXPORATION AND FLEXOR TENDON REPAIR AND NERVE REPAIR;  Surgeon: Milly Jakob, MD;  Location: [facility];  Service: Orthopedics;  Laterality: Left;  . INSERT / REPLACE / REMOVE PACEMAKER  02/05/2012   dual chamber, sinus node dysfunction, sinus arrest, PAF, Medtronic Revo serial#-[id number] H: last checked 05/2015  . LUMBAR LAMINECTOMY Left 1976   L4-5  . PACEMAKER REMOVAL  01/04/2017  . PERMANENT PACEMAKER INSERTION N/A 02/05/2012   Procedure: PERMANENT  PACEMAKER INSERTION;  Surgeon: [name], MD; Generator Medtronic Revo model [place] serial number [id number] H Laterality: N/A;  . RETINAL DETACHMENT SURGERY Left ~ 1999  . REVERSE SHOULDER ARTHROPLASTY Left 2018   Left shoulder reverse [name] in [place]).  [name] RIGHT/LEFT HEART CATH AND CORONARY ANGIOGRAPHY N/A 03/22/2018   EF 30-35%, no CAD.  Procedure: RIGHT/LEFT HEART CATH AND CORONARY ANGIOGRAPHY;  Surgeon: Jolaine Artist, MD;  Location:  [place] [facility];  Service: Cardiovascular;  Laterality: N/A;  . TRANSTHORACIC ECHOCARDIOGRAM  08/25/10; 05/2012; 03/23/16;12/2017   mild asymmetric LVH, normal systolic function, normal diastolic fxn, mild-to-mod mitral regurg, mild aortic valve sclerosis and trace AI, mild aortic root dilatation. 2014 f/u showed EF 40-45%, mod LAE, A FIB.  02/2016 EF 40%, diffuse hypokinesis, grade 2 DD. 12/2017 EF 35-40%,diffuse hypokin,grd III DD, mild MR  . WISDOM TOOTH EXTRACTION     Social History   Socioeconomic History  . Marital status: Married    Spouse name: Not on file  . Number of children: Not on file  . Years of education: Not on file  . Highest education level: Not on file  Occupational History  . Not on file  Tobacco Use  . Smoking status: Never Smoker  . Smokeless tobacco: Never Used  Vaping Use  . Vaping Use: Never used  Substance and Sexual Activity  . Alcohol use: Yes    Comment: occ  . Drug use: No  . Sexual activity: Not on file  Other Topics Concern  . Not on file  Social History Narrative  . Not on file   Social Determinants of Health   Financial Resource Strain: Low Risk   . Difficulty of Paying Living Expenses: Not hard at all  Food Insecurity: No Food Insecurity  . Worried About Charity fundraiser in the Last Year: Never true  . Ran Out of Food in the Last Year: Never true  Transportation Needs: No Transportation Needs  . Lack of Transportation (Medical): No  . Lack of Transportation (Non-Medical): No   Physical Activity: Sufficiently Active  . Days of Exercise per Week: 7 days  . Minutes of Exercise per Session: 30 min  Stress: No Stress Concern Present  . Feeling of Stress : Not at all  Social Connections: Moderately Integrated  . Frequency of Communication with Friends and Family: More than three times a week  . Frequency of Social Gatherings with Friends and Family: More than three times a week  . Attends Religious Services: Never  . Active Member of Clubs or Organizations: Yes  . Attends Archivist Meetings: More than 4 times per year  . Marital Status: Married  Human resources officer Violence: Not At Risk  . Fear of Current or Ex-Partner: No  . Emotionally Abused: No  . Physically Abused: No  . Sexually Abused: No   Current Outpatient Medications on File Prior to Visit  Medication Sig Dispense Refill  . acetaminophen (TYLENOL) 325 MG tablet Take 650 mg by mouth every 6 (six) hours as needed for moderate pain.    . BD INSULIN SYRINGE U/F 31G X 5/16" 1 ML MISC USE DAILY AS DIRECTED 100 each 3  . COMBIGAN 0.2-0.5 % ophthalmic solution Place 1 drop into both eyes at bedtime.     . Continuous Blood Gluc Sensor (FREESTYLE LIBRE 14 DAY SENSOR) MISC Check glucose 4 times per day 6 each 3  . Continuous Blood Gluc Sensor (FREESTYLE LIBRE SENSOR SYSTEM) MISC USE TO CHECK BLOOD SUGARS 4 TIMES DAILY. Before breakfast, before lunch, before supper, and at bedtime 1 each 3  . Dulaglutide (TRULICITY) 1.5 0000000 SOPN Inject 1.5 mg into the skin once a week. 6 mL 2  . ENTRESTO 97-103 MG TAKE 1 TABLET TWO TIMES A DAY 180 tablet 3  . eplerenone (INSPRA) 25 MG tablet TAKE 1 TABLET DAILY 90 tablet 3  . gabapentin (NEURONTIN) 300 MG capsule TAKE 2 CAPSULES TWICE A  DAY 360 capsule 3  . glucose blood test strip Use to check blood sugars 1-2 times daily (Patient not taking: Reported on 10/04/2020) 100 each 3  . insulin aspart (NOVOLOG FLEXPEN) 100 UNIT/ML FlexPen INJECT 16 UNITS UNDER THE SKIN AT  THE TIME OF EACH MEAL (Patient taking differently: Pt taking 12 units in the morning 8 units with light lunch or snack 10 units with dinner.) 15 mL 4  . insulin glargine (LANTUS) 100 UNIT/ML injection INJECT 54 UNITS UNDER THE SKIN DAILY, WILL BE TITRATING DOSE PERIODICALLY 50 mL 3  . Insulin Pen Needle (SURE COMFORT PEN NEEDLES) 31G X 5 MM MISC USE TO INJECT INSULIN UNDER THE SKIN 100 each 11  . JARDIANCE 10 MG TABS tablet TAKE 1 TABLET DAILY 90 tablet 3  . Lancets (FREESTYLE) lancets USE AS INSTRUCTED (Patient not taking: Reported on 10/04/2020) 100 each 11  . latanoprost (XALATAN) 0.005 % ophthalmic solution Place 1 drop into both eyes at bedtime.   12  . meclizine (ANTIVERT) 25 MG tablet TAKE 1 TABLET THREE TIMES A DAY AS NEEDED FOR DIZZINESS 270 tablet 3  . metoprolol succinate (TOPROL-XL) 50 MG 24 hr tablet TAKE 1 TABLET DAILY. TAKE WITH OR IMMEDIATELY FOLLOWING A MEAL (Patient taking differently: Take by mouth daily. Pt taking 1/2 tablet (25 mg) daily) 90 tablet 0  . omeprazole (PRILOSEC) 40 MG capsule Take 1 capsule (40 mg total) by mouth daily. 90 capsule 1  . oxybutynin (DITROPAN-XL) 5 MG 24 hr tablet TAKE 1 TABLET AT BEDTIME 90 tablet 3  . oxyCODONE-acetaminophen (PERCOCET) 5-325 MG tablet Take 1 tablet by mouth every 4 (four) hours as needed for severe pain. (Patient not taking: Reported on 10/04/2020) 20 tablet 0  . rOPINIRole (REQUIP) 1 MG tablet TAKE 1 TABLET DAILY 90 tablet 3  . rosuvastatin (CRESTOR) 20 MG tablet Take 1 tablet (20 mg total) by mouth daily. (Patient not taking: Reported on 10/04/2020) 90 tablet 3  . sertraline (ZOLOFT) 100 MG tablet TAKE 1 TABLET DAILY 90 tablet 1  . triamcinolone (KENALOG) 0.1 % SMARTSIG:1 Application Topical 2-3 Times Daily    . XARELTO 20 MG TABS tablet TAKE 1 TABLET DAILY WITH SUPPER (Patient taking differently: Take 20 mg by mouth daily with supper.) 90 tablet 3   No current facility-administered medications on file prior to visit.   No Known  Allergies Family History  Problem Relation Age of Onset  . Heart failure Mother   . Stroke Mother   . Stroke Father   . Heart disease Sister   . Heart disease Sister   . Cancer Sister        liver  . Cancer Brother        lung  . Cancer Brother        lung  . Heart disease Brother    PE: There were no vitals taken for this visit. Wt Readings from Last 3 Encounters:  10/04/20 240 lb (108.9 kg)  09/09/20 243 lb 6.4 oz (110.4 kg)  09/03/20 247 lb (112 kg)   Constitutional: overweight, in NAD Eyes: PERRLA, EOMI, no exophthalmos ENT: moist mucous membranes, no thyromegaly, no cervical lymphadenopathy Cardiovascular: RRR, No MRG Respiratory: CTA B Gastrointestinal: abdomen soft, NT, ND, BS+ Musculoskeletal: no deformities, strength intact in all 4 Skin: moist, warm, no rashes Neurological: no tremor with outstretched hands, DTR normal in all 4  ASSESSMENT: 1. DM2, insulin-dependent, uncontrolled, with complications: - CAD with ?h/o AMI - nonischemic CMP - CHF, s/p AICD - Afib,  s/p pacemaker - cerebrovascular disease with h/o TIA - CKD stage III - peripheral neuropathy - ED  No family history of medullary thyroid cancer or personal history of pancreatitis.  2. HL  PLAN:  1. Patient with longstanding, uncontrolled, type 2 diabetes, on oral antidiabetic regimen with SGLT2 inhibitor, also weekly GLP-1 receptor agonist and basal/bolus insulin regimen, with improved control at last visit.  At that time, HbA1c was better, at 7.6%.  Sugars are slightly higher after lunch and dinner but remain high overnight, dropping below second half of the night to target values in the morning.  We decreased the dose of his Lantus to avoid further drops but I advised him to increase his NovoLog and his Trulicity dose. -Of note, he did have an appointment with nutrition before last visit. CGM interpretation: -At today's visit, we reviewed his CGM downloads: It appears that 88% of values are in  target range (goal >70%), while 8% are higher than 180 (goal <25%), and 4% are lower than 70 (goal <4%).  The calculated average blood sugar is 128.  The projected HbA1c for the next 3 months ([facility]) is 6.4%. -Reviewing the CGM trends, his sugars are mostly at goal with few exceptions, with occasional low blood sugars in the early and late afternoon.  Therefore, he has been omitting his NovoLog insulin before lunch and dinner and he admits that he only takes this when sugars are higher than 110.  He usually takes the 12 units before breakfast.  He has backed off his Lantus to only 40 units as he occasionally still some lower blood sugars at night.  His sugars appear to be well controlled overnight per review of the ambulatory glucose profile so for now, we discussed about continuing Lantus at 40 units but he can also drop it to 36 units if he has a particularly active day to avoid low blood sugars during the night.  Also, I advised him to take NovoLog at a lower dose only before larger meals but we will go ahead and increase Trulicity to 3 mg weekly, since he is tolerating this well.  This will help with postprandial blood sugars so I am hoping that he can avoid NovoLog completely. - I suggested to:  Patient Instructions  Please continue: - Jardiance 10 mg before breakfast  Please increase: - Trulicity 3 mg weekly  Please decrease: - NovoLog 6-8 units before a larger meal  Change: - Lantus 36-40 units at bedtime  Please return in 4 months.   - we checked his HbA1c: 6.7% (better, at goal now) - advised to check sugars at different times of the day - 4x a day, rotating check times - advised for yearly eye exams >> he is UTD - return to clinic in 3-4 months  2. HL -Reviewed latest lipid panel from 05/2020:  LDL above target, HDL low, triglycerides high: Lab Results  Component Value Date   CHOL 168 06/22/2020   HDL 38.80 (L) 06/22/2020   LDLCALC 85 03/16/2020   LDLDIRECT 103.0 06/22/2020    TRIG 215.0 (H) 06/22/2020   CHOLHDL 4 06/22/2020  -After the above results returned, I suggested a change from Zocor 20 mg daily to Crestor 20 mg daily.  However, he is not taking this...  Philemon Kingdom, MD PhD [facility]

## 2020-12-09 NOTE — Patient Instructions (Addendum)
Please continue: - Jardiance 10 mg before breakfast  Please increase: - Trulicity 3 mg weekly  Please decrease: - NovoLog 6-8 units before a larger meal  Change: - Lantus 36-40 units at bedtime  Please return in 4 months.

## 2020-12-12 ENCOUNTER — Other Ambulatory Visit: Payer: Self-pay

## 2020-12-12 ENCOUNTER — Emergency Department (HOSPITAL_BASED_OUTPATIENT_CLINIC_OR_DEPARTMENT_OTHER)
Admission: EM | Admit: 2020-12-12 | Discharge: 2020-12-12 | Disposition: A | Discharge status: Home / Self Care | Payer: Medicare Other | Attending: Emergency Medicine | Admitting: Emergency Medicine

## 2020-12-12 ENCOUNTER — Encounter (HOSPITAL_BASED_OUTPATIENT_CLINIC_OR_DEPARTMENT_OTHER): Payer: Self-pay | Admitting: *Deleted

## 2020-12-12 DIAGNOSIS — Z96612 Presence of left artificial shoulder joint: Secondary | ICD-10-CM | POA: Diagnosis not present

## 2020-12-12 DIAGNOSIS — I4821 Permanent atrial fibrillation: Secondary | ICD-10-CM | POA: Insufficient documentation

## 2020-12-12 DIAGNOSIS — M542 Cervicalgia: Secondary | ICD-10-CM | POA: Diagnosis not present

## 2020-12-12 DIAGNOSIS — M436 Torticollis: Secondary | ICD-10-CM | POA: Diagnosis not present

## 2020-12-12 DIAGNOSIS — Z794 Long term (current) use of insulin: Secondary | ICD-10-CM | POA: Insufficient documentation

## 2020-12-12 DIAGNOSIS — E1122 Type 2 diabetes mellitus with diabetic chronic kidney disease: Secondary | ICD-10-CM | POA: Insufficient documentation

## 2020-12-12 DIAGNOSIS — I130 Hypertensive heart and chronic kidney disease with heart failure and stage 1 through stage 4 chronic kidney disease, or unspecified chronic kidney disease: Secondary | ICD-10-CM | POA: Insufficient documentation

## 2020-12-12 DIAGNOSIS — Z950 Presence of cardiac pacemaker: Secondary | ICD-10-CM | POA: Diagnosis not present

## 2020-12-12 DIAGNOSIS — Z7901 Long term (current) use of anticoagulants: Secondary | ICD-10-CM | POA: Insufficient documentation

## 2020-12-12 DIAGNOSIS — N183 Chronic kidney disease, stage 3 unspecified: Secondary | ICD-10-CM | POA: Insufficient documentation

## 2020-12-12 DIAGNOSIS — Z85828 Personal history of other malignant neoplasm of skin: Secondary | ICD-10-CM | POA: Insufficient documentation

## 2020-12-12 DIAGNOSIS — I5042 Chronic combined systolic (congestive) and diastolic (congestive) heart failure: Secondary | ICD-10-CM | POA: Diagnosis not present

## 2020-12-12 DIAGNOSIS — E1159 Type 2 diabetes mellitus with other circulatory complications: Secondary | ICD-10-CM | POA: Insufficient documentation

## 2020-12-12 DIAGNOSIS — Z79899 Other long term (current) drug therapy: Secondary | ICD-10-CM | POA: Diagnosis not present

## 2020-12-12 MED ORDER — LIDOCAINE 5 % EX PTCH: 1.0000 | MEDICATED_PATCH | CUTANEOUS | 0 refills | Status: DC

## 2020-12-12 MED ORDER — METHOCARBAMOL 500 MG PO TABS: 500.0000 mg | ORAL_TABLET | Freq: Two times a day (BID) | ORAL | 0 refills | Status: DC

## 2020-12-12 MED ORDER — LIDOCAINE 5 % EX PTCH
1.0000 | MEDICATED_PATCH | CUTANEOUS | Status: DC
  Administered 2020-12-12: 1 via TRANSDERMAL
  Filled 2020-12-12: qty 1

## 2020-12-12 MED ORDER — DICLOFENAC SODIUM 1 % EX GEL: 4.0000 g | Freq: Four times a day (QID) | CUTANEOUS | 1 refills | Status: DC

## 2020-12-12 NOTE — ED Triage Notes (Signed)
Right side neck pain radiating into right shoulder. Denies injury. Reports pain is worse with certain movements

## 2020-12-12 NOTE — ED Notes (Signed)
ED Provider at bedside. 

## 2020-12-12 NOTE — ED Notes (Signed)
Pt discharged to home. Discharge instructions have been discussed with patient and/or family members. Pt verbally acknowledges understanding d/c instructions, and endorses comprehension to checkout at registration before leaving.  °

## 2020-12-12 NOTE — Discharge Instructions (Signed)
Please read the attached information.  Please use Tylenol 1000 mg every 6 hours for pain.  Also apply the Voltaren gel that I prescribed you.  Warm compresses and gentle stretching will also help.  I have prescribed you a muscle relaxer to use at nighttime if you are having difficulty sleeping because of the pain.  Note that this medication can cause some drowsiness and can increase your risk of falling.  Please do not operate heavy machinery while taking his medication.

## 2020-12-12 NOTE — ED Notes (Signed)
Pt endorses tylenol 325mg  3 hrs pta.

## 2020-12-12 NOTE — ED Provider Notes (Signed)
[facility] EMERGENCY DEPARTMENT Provider Note   CSN: [id number] Arrival date & time: 12/12/20  1338     History Chief Complaint  Patient presents with  . Neck Pain    Mason [name] is a 80 y.o. male.  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  He states that he was lying in his bed watching television and rolled over to get out of bed and felt a "crick "in his neck.  He states that he was unable to get a pop because relief.  He states that he feels that it is difficult for him to move his head particularly looking right without discomfort.  He denies any headache nausea vomiting or vision changes he denies any blurred vision double vision or floaters in his vision.  Denies any ringing in his ears or any facial numbness or weakness no slurred speech or confusion.  Denies any head or neck trauma.  No other associated symptoms.  He states he has been doing warm compresses to his neck which causes some relief but does not cause the pain to go away completely.  Denies any recent manipulation of his neck to.  Denies any weakness or numbness in any parts of his body.  No difficulty walking.    Past Medical History:  Diagnosis Date  . AICD (automatic cardioverter/defibrillator) present 2018   MDT CRT-D.  Fatigue-->completely pacer dependent.  Pacer settings adjusted 12/2017 to allow more chronotrophc variance with ADL's//exertion.  . Arthritis    Pt is s/p eft reverse total shoulder arthroplasty.  . Balanitis    chronic fungal  . BPH (benign prostatic hypertrophy) 06/2011   Irritative sx's; pt declined trial of anticholinergic per Urology records  . Chronic combined systolic and diastolic heart failure ([place]) 05/31/2012   Nonischemic:  EF 40-45%, LA mod-severe  dilated, AFIB.   02/2016 EF 40%, diffuse hypokinesis, grade 2 DD.  Myoc perf imaging showed EF 32% 04/2016.  Pt upgraded to CRT-D 01/04/17.  [name] Chronic renal insufficiency, stage III (moderate) (HCC) 2015   CrCl about 60 ml/min  . Complete heart block (HCC)    Has dual chamber pacer.  . Depression   . DOE (dyspnea on exertion)    NYHA class II/III CHF  . Dyspnea 2021   with exertion, bending over  . Episodic low back pain 01/22/2013   w/intermittent radiculitis (12/2014 his neurologist referred him to pain mgmt for epidural steroid injection)  . Erectile dysfunction 2019   due to zoloft--urol rx'd viagra  . GERD (gastroesophageal reflux disease)   . H/O tilt table evaluation 11/02/05   negative  . Helicobacter pylori gastritis 01/2016  . History of adenomatous polyp of colon 10/12/11   Dr. [name] (3 right side of colon- tubular adenomas removed)  . History of cardiovascular stress test 05/28/12   no ischemia, EF 37%, imaging results are unchanged and within normal variance  . History of chronic prostatitis   . History of kidney stones   . History of vertigo    + Hx of posterior HA's.  Neuro (Dr. Erling Cruz) eval 2011.  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  . Hyperlipidemia   . Hypertension   . Lumbar spondylosis    lumbosacral radiculopathy at L4 by  EMG testing, right foot drop (neurologist is Dr. Linus Salmons with [facility] in [place])--neurologist referred him to neurosurgery  . Migraine    "used to have them all the time; none for years" (01/04/2017)  . Myocardial infarction [facility]) ?1970s   not entirely certain of this  . Nephrolithiasis 07/2012   Left UVJ 2 mm stone with dilation of renal collecting system and slight hydroureter on right  . Neuropathy   . NICM (nonischemic cardiomyopathy) ([place])    a. 02/2018 Cath: LM nl, LAD min irregs, LCX no, RCA 20d. IRJJ88. Fick CO/CI 4.4/2.0.  . Pacemaker 02/05/2012    dual chamber, complete heart block, meddtronic revo, lasted checked 12/2015.  Since no CAD on cath 05/2016, cards recommends upgrade to CRT-D.  [name] Permanent atrial fibrillation ([place])    DCCV 07/09/13-converted, lasted two days, then back into afib--needs lifetime anticoagulation (Xarelto as of 09/2014)  . Prostate cancer screening 09/2017   done by urol annually (normal prostate exam documented + PSA 0.84 as of 10/01/17 urol f/u.  10/2018 urol f/u PSA 0.6, no prostate nodule.  . Rectus diastasis   . Right ankle sprain 08/2017   w/distal fibula avulsion fx noted on u/s but not plain film-(Dr. Hudnall).  . Skin cancer of arm, left    "burned it off" (01/04/2017)  . TIA (transient ischemic attack)    L face and L arm weakness. Peri procedural->a. 03/22/2018 following cath. CT head neg. No MRI b/c has pacer. Likely due to embolus to distal branch of RMCA  . Type II diabetes mellitus [facility])     Patient Active Problem List   Diagnosis Date Noted  . History of retinal detachment 09/23/2020  . Phacodonesis 09/23/2020  . Posterior vitreous detachment of right eye 09/23/2020  . Pseudophakodonesis 09/23/2020  . Intermediate stage nonexudative age-related macular degeneration of both eyes 09/23/2020  . Gastroesophageal reflux disease 09/14/2020  . Long term (current) use of anticoagulants 09/03/2018  . 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 2018 07/24/2018  . NICM (nonischemic cardiomyopathy) ([place]) 03/23/2018  . CKD (chronic kidney disease), stage III ([place]) 03/23/2018  . TIA (transient ischemic attack) 03/22/2018  . TIA resulting from procedure 03/22/2018  . Essential hypertension 01/18/2018  . CHF (congestive heart failure) ([place]) 01/04/2017  . Chronic systolic heart failure ([place]) 08/04/2016  . Chronic renal insufficiency, stage II (mild) 12/09/2015  . Permanent atrial fibrillation ([place]) 06/22/2015  . Poorly controlled type 2 diabetes mellitus with circulatory disorder  ([place]) 03/25/2015  . Leg fatigue 02/15/2014  . Lumbar back pain with radiculopathy affecting left lower extremity 02/15/2014  . Other malaise and fatigue 02/05/2014  . Osteoarthritis of right knee 10/14/2013  . HTN (hypertension), benign 05/26/2013  . Hyperlipidemia 04/21/2013  . Atrial fibrillation, permanent 03/08/2013  . Left ventricular dysfunction 05/31/2012  . Complete heart block ([place]) 02/06/2012  . Dyslipidemia (high LDL; low HDL) 02/06/2012    Past Surgical History:  Procedure Laterality Date  . ABI's Bilateral 05/21/2018   normal  . BACK SURGERY    . BIV ICD INSERTION CRT-D N/A 01/04/2017   Procedure: BiV ICD ;  Surgeon: Constance Haw, MD;  Location: [facility] CV LAB;  Service: Cardiovascular;  Laterality: N/A;  . CARDIAC CATHETERIZATION N/A 06/14/2016   Minimal nonobstructive dz, EF 25-35%.  Procedure: Left Heart Cath and Coronary Angiography;  Surgeon: Peter M Martinique, MD;  Location: [facility] CV LAB;  Service: Cardiovascular;  Laterality: N/A;  . CARDIOVASCULAR STRESS TEST  2012   2012 nuclear perfusion  study: low risk scan; 04/2016 normal myocardial perfusion imaging, EF 32%.  [name] CARDIOVERSION  07/09/2012   Procedure: CARDIOVERSION;  Surgeon: [name], MD;  Location: [facility] ENDOSCOPY;  Service: Cardiovascular;  Laterality: N/A;  . CATARACT EXTRACTION W/ INTRAOCULAR LENS IMPLANT & ANTERIOR VITRECTOMY, BILATERAL Bilateral   . CIRCUMCISION N/A 03/23/2020   Procedure: CIRCUMCISION ADULT;  Surgeon: Remi Haggard, MD;  Location: [facility];  Service: Urology;  Laterality: N/A;  . COLONOSCOPY W/ POLYPECTOMY  approx 2006; repeated 09/2011   Polyps on 2013 EGD as well, repeat 12/2014  . ESOPHAGOGASTRODUODENOSCOPY  10/18/06   Done due to chronic GERD: Normal, bx showed no barrett's esophagus (Dr. [name])  . FLEXOR TENDON REPAIR Left 10/02/2016   Procedure: LEFT RING FINGER WOUND EXPORATION AND FLEXOR TENDON REPAIR AND NERVE REPAIR;  Surgeon: Milly Jakob, MD;  Location: [facility];  Service: Orthopedics;  Laterality: Left;  . INSERT / REPLACE / REMOVE PACEMAKER  02/05/2012   dual chamber, sinus node dysfunction, sinus arrest, PAF, Medtronic Revo serial#-[id number] H: last checked 05/2015  . LUMBAR LAMINECTOMY Left 1976   L4-5  . PACEMAKER REMOVAL  01/04/2017  . PERMANENT PACEMAKER INSERTION N/A 02/05/2012   Procedure: PERMANENT PACEMAKER INSERTION;  Surgeon: [name], MD; Generator Medtronic Revo model [place] serial number [id number] H Laterality: N/A;  . RETINAL DETACHMENT SURGERY Left ~ 1999  . REVERSE SHOULDER ARTHROPLASTY Left 2018   Left shoulder reverse [name] in [place]).  [name] RIGHT/LEFT HEART CATH AND CORONARY ANGIOGRAPHY N/A 03/22/2018   EF 30-35%, no CAD.  Procedure: RIGHT/LEFT HEART CATH AND CORONARY ANGIOGRAPHY;  Surgeon: Jolaine Artist, MD;  Location: [facility] CV LAB;  Service: Cardiovascular;  Laterality: N/A;  . TRANSTHORACIC ECHOCARDIOGRAM  08/25/10; 05/2012; 03/23/16;12/2017   mild asymmetric LVH, normal systolic function, normal diastolic fxn, mild-to-mod mitral regurg, mild aortic valve sclerosis and trace AI, mild aortic root dilatation. 2014 f/u showed EF 40-45%, mod LAE, A FIB.  02/2016 EF 40%, diffuse hypokinesis, grade 2 DD. 12/2017 EF 35-40%,diffuse hypokin,grd III DD, mild MR  . WISDOM TOOTH EXTRACTION         Family History  Problem Relation Age of Onset  . Heart failure Mother   . Stroke Mother   . Stroke Father   . Heart disease Sister   . Heart disease Sister   . Cancer Sister        liver  . Cancer Brother        lung  . Cancer Brother        lung  . Heart disease Brother     Social History   Tobacco Use  . Smoking status: Never Smoker  . Smokeless tobacco: Never Used  Vaping Use  . Vaping Use: Never used  Substance Use Topics  . Alcohol use: Yes    Comment: occ  . Drug use: No    Home Medications Prior to Admission medications   Medication Sig Start Date End Date Taking? Authorizing Provider   diclofenac Sodium (VOLTAREN) 1 % GEL Apply 4 g topically 4 (four) times daily. 12/12/20  Yes Carmesha Morocco S, PA  lidocaine (LIDODERM) 5 % Place 1 patch onto the skin daily. Remove & Discard patch within 12 hours or as directed by MD 12/12/20  Yes Satish Hammers, Kathleene Hazel, PA  methocarbamol (ROBAXIN) 500 MG tablet Take 1 tablet (500 mg total) by mouth 2 (two) times daily. 12/12/20  Yes Pati Gallo S, PA  acetaminophen (TYLENOL) 325 MG tablet Take 650 mg by mouth every  6 (six) hours as needed for moderate pain.    [provider]  BD INSULIN SYRINGE U/F 31G X 5/16" 1 ML MISC USE DAILY AS DIRECTED 01/29/20   McGowen, Adrian Blackwater, MD  COMBIGAN 0.2-0.5 % ophthalmic solution Place 1 drop into both eyes at bedtime.  04/19/16   [provider]  Continuous Blood Gluc Sensor (FREESTYLE LIBRE 14 DAY SENSOR) MISC Check glucose 4 times per day 08/16/20   McGowen, Adrian Blackwater, MD  Continuous Blood Gluc Sensor (FREESTYLE LIBRE SENSOR SYSTEM) MISC USE TO CHECK BLOOD SUGARS 4 TIMES DAILY. Before breakfast, before lunch, before supper, and at bedtime 08/04/20   McGowen, Adrian Blackwater, MD  Dulaglutide (TRULICITY) 3 YN/8.2NF SOPN Inject 3 mg into the skin once a week. 12/09/20   Philemon Kingdom, MD  ENTRESTO 97-103 MG TAKE 1 TABLET TWO TIMES A DAY 07/20/20   Croitoru, Mihai, MD  eplerenone (INSPRA) 25 MG tablet TAKE 1 TABLET DAILY 09/01/20   Croitoru, Mihai, MD  gabapentin (NEURONTIN) 300 MG capsule TAKE 2 CAPSULES TWICE A DAY 12/02/20   McGowen, Adrian Blackwater, MD  glucose blood test strip Use to check blood sugars 1-2 times daily Patient not taking: Reported on 10/04/2020 03/03/19   Tammi Sou, MD  insulin aspart (NOVOLOG FLEXPEN) 100 UNIT/ML FlexPen INJECT 6-8 UNITS UNDER THE SKIN AT THE TIME OF [facility] MEAL 12/09/20   Philemon Kingdom, MD  insulin glargine (LANTUS) 100 UNIT/ML injection Inject 0.36-0.4 mLs (36-40 Units total) into the skin daily. INJECT 54 UNITS UNDER THE SKIN DAILY, WILL BE TITRATING DOSE PERIODICALLY  12/09/20   Philemon Kingdom, MD  Insulin Pen Needle (SURE COMFORT PEN NEEDLES) 31G X 5 MM MISC USE TO INJECT INSULIN UNDER THE SKIN 10/13/20   McGowen, Adrian Blackwater, MD  JARDIANCE 10 MG TABS tablet TAKE 1 TABLET DAILY 03/22/20   McGowen, Adrian Blackwater, MD  Lancets (FREESTYLE) lancets USE AS INSTRUCTED Patient not taking: Reported on 10/04/2020 05/08/19   Tammi Sou, MD  latanoprost (XALATAN) 0.005 % ophthalmic solution Place 1 drop into both eyes at bedtime.  04/24/15   [provider]  meclizine (ANTIVERT) 25 MG tablet TAKE 1 TABLET THREE TIMES A DAY AS NEEDED FOR DIZZINESS 08/31/20   McGowen, Adrian Blackwater, MD  metoprolol succinate (TOPROL-XL) 50 MG 24 hr tablet TAKE 1 TABLET DAILY. TAKE WITH OR IMMEDIATELY FOLLOWING A MEAL Patient taking differently: Take by mouth daily. Pt taking 1/2 tablet (25 mg) daily 08/23/20   McGowen, Adrian Blackwater, MD  omeprazole (PRILOSEC) 40 MG capsule Take 1 capsule (40 mg total) by mouth daily. 07/13/20   McGowen, Adrian Blackwater, MD  oxybutynin (DITROPAN-XL) 5 MG 24 hr tablet TAKE 1 TABLET AT BEDTIME 06/10/20   McGowen, Adrian Blackwater, MD  oxyCODONE-acetaminophen (PERCOCET) 5-325 MG tablet Take 1 tablet by mouth every 4 (four) hours as needed for severe pain. Patient not taking: Reported on 10/04/2020 03/23/20 03/23/21  Remi Haggard, MD  rOPINIRole (REQUIP) 1 MG tablet TAKE 1 TABLET DAILY 06/10/20   McGowen, Adrian Blackwater, MD  rosuvastatin (CRESTOR) 20 MG tablet Take 1 tablet (20 mg total) by mouth daily. Patient not taking: Reported on 10/04/2020 09/09/20   Philemon Kingdom, MD  sertraline (ZOLOFT) 100 MG tablet TAKE 1 TABLET DAILY 10/13/20   McGowen, Adrian Blackwater, MD  triamcinolone (KENALOG) 0.1 % SMARTSIG:1 Application Topical 2-3 Times Daily 06/01/20   [provider]  XARELTO 20 MG TABS tablet TAKE 1 TABLET DAILY WITH SUPPER Patient taking differently: Take 20 mg by mouth  daily with supper. 02/23/20   Croitoru, Dani Gobble, MD    Allergies    Patient has no known allergies.  Review of  Systems   Review of Systems  Constitutional: Negative for chills and fever.  HENT: Negative for congestion.   Eyes: Negative for pain.  Respiratory: Negative for cough and shortness of breath.   Cardiovascular: Negative for chest pain and leg swelling.  Gastrointestinal: Negative for abdominal pain and vomiting.  Genitourinary: Negative for dysuria.  Musculoskeletal: Positive for neck pain. Negative for myalgias.  Skin: Negative for rash.  Neurological: Negative for dizziness, syncope, speech difficulty, weakness and headaches.    Physical Exam Updated Vital Signs BP 123/82 (BP Location: Right Arm)   Pulse 69   Temp 98 F (36.7 C) (Oral)   Resp 16   Ht 5\' 11"  (1.803 m)   Wt 108 kg   SpO2 96%   BMI 33.19 kg/m   Physical Exam Vitals and nursing note reviewed.  Constitutional:      General: He is not in acute distress.    Appearance: Normal appearance. He is not ill-appearing.     Comments: Pleasant well-appearing 80 year old.  In no acute distress.  Sitting comfortably in bed.  Able answer questions appropriately follow commands. No increased work of breathing. Speaking in full sentences.   HENT:     Head: Normocephalic and atraumatic.  Eyes:     General: No scleral icterus.       Right eye: No discharge.        Left eye: No discharge.     Conjunctiva/sclera: Conjunctivae normal.  Pulmonary:     Effort: Pulmonary effort is normal.     Breath sounds: No stridor.  Musculoskeletal:       Arms:     Comments: No scapular tenderness palpation or chest palpation of either upper extremity    Skin:    General: Skin is warm and dry.  Neurological:     Mental Status: He is alert and oriented to person, place, and time. Mental status is at baseline.     Comments: Alert and oriented to self, place, time and event.   Speech is fluent, clear without dysarthria or dysphasia.   Strength 5/5 in upper/lower extremities  Sensation intact in upper/lower extremities   Normal  gait.  Normal finger-to-nose and feet tapping.  CN I not tested  CN II grossly intact visual fields bilaterally. Did not visualize posterior eye.   CN III, IV, VI PERRLA and EOMs intact bilaterally  CN V Intact sensation to sharp and light touch to the face  CN VII facial movements symmetric  CN VIII not tested  CN IX, X no uvula deviation, symmetric rise of soft palate  CN XI 5/5 SCM and trapezius strength bilaterally  CN XII Midline tongue protrusion, symmetric L/R movements      ED Results / Procedures / Treatments   Labs (all labs ordered are listed, but only abnormal results are displayed) Labs Reviewed - No data to display  EKG None  Radiology No results found.  Procedures Procedures   Medications Ordered in ED Medications  lidocaine (LIDODERM) 5 % 1 patch (1 patch Transdermal Patch Applied 12/12/20 1516)    ED Course  I have reviewed the triage vital signs and the nursing notes.  Pertinent labs & imaging results that were available during my care of the patient were reviewed by me and considered in my medical decision making (see chart for details).    MDM Rules/Calculators/A&P  Patient is a 80 year old male he is a extensive past medical history see HPI.  He is here with complaints of right-sided neck pain.  Physical exam is reassuring.  Does not seem to have any significant swelling does not have any dysphagia or difficulty swallowing or speaking.  No slurred speech.  He denies any neurologic complaints and motor neurologic exam is without abnormality.  He does have some muscular tenderness to palpation of the right trapezius and right side of the neck over the musculature.  Considered other diagnoses such as vertebral artery dissection, carotid artery dissection, carotid or vertebral artery occlusion.  Also consider deep space infection.  Doubt any of these or similar emergent conditions given patient's lack of neurologic symptoms, normal  neurologic exam, symptoms more consistent with muscular injury/torticollis.  I discussed this case with my attending physician who cosigned this note including patient's presenting symptoms, physical exam, and planned diagnostics and interventions. Attending physician stated agreement with plan or made changes to plan which were implemented.   Attending physician assessed patient at bedside.   Will discharge home with conservative therapy at this time  Final Clinical Impression(s) / ED Diagnoses Final diagnoses:  Neck pain  Torticollis, acute    Rx / DC Orders ED Discharge Orders         Ordered    diclofenac Sodium (VOLTAREN) 1 % GEL  4 times daily        12/12/20 1448    methocarbamol (ROBAXIN) 500 MG tablet  2 times daily        12/12/20 1448    lidocaine (LIDODERM) 5 %  Every 24 hours        12/12/20 1448           Tedd Sias, [place] 12/12/20 [place], [place], DO 12/12/20 1727

## 2020-12-12 NOTE — ED Notes (Signed)
Pt provided urinal as per verbal by EDP Ova Freshwater, PA

## 2020-12-17 ENCOUNTER — Encounter: Payer: Self-pay | Admitting: Family Medicine

## 2020-12-17 ENCOUNTER — Ambulatory Visit (INDEPENDENT_AMBULATORY_CARE_PROVIDER_SITE_OTHER): Payer: Medicare Other | Admitting: Family Medicine

## 2020-12-17 ENCOUNTER — Other Ambulatory Visit: Payer: Self-pay

## 2020-12-17 VITALS — BP 98/65 | HR 73 | Temp 97.9°F | Resp 16 | Ht 71.0 in | Wt 237.8 lb

## 2020-12-17 DIAGNOSIS — M542 Cervicalgia: Secondary | ICD-10-CM | POA: Diagnosis not present

## 2020-12-17 DIAGNOSIS — I2510 Atherosclerotic heart disease of native coronary artery without angina pectoris: Secondary | ICD-10-CM | POA: Diagnosis not present

## 2020-12-17 MED ORDER — LIDOCAINE 5 % EX PTCH: 1.0000 | MEDICATED_PATCH | CUTANEOUS | 0 refills | Status: DC

## 2020-12-17 MED ORDER — CYCLOBENZAPRINE HCL 10 MG PO TABS: ORAL_TABLET | ORAL | 0 refills | Status: DC

## 2020-12-17 MED ORDER — DICLOFENAC SODIUM 1 % EX GEL: 4.0000 g | Freq: Four times a day (QID) | CUTANEOUS | 1 refills | Status: DC

## 2020-12-17 NOTE — Progress Notes (Signed)
OFFICE VISIT  12/17/2020  CC:  Chief Complaint  Patient presents with  . Neck Pain    Was seen in ED on 5/22, given rx for diclofenac topical ointment, lidocaine patches, methocarbamol tablets. Started having pain 4 days prior on 5/18.   HPI:    Patient is a 80 y.o. Caucasian male who presents for neck pain. Pt went to ED for this complaint (acute R sided neck pain) on 12/12/20, I reviewed records today.  He was dx'd with musculoskeletal pain/torticollis.  No imaging or lab work was done. Voltaren gel, robaxin, and lidoderm patches rx'd.  HPI: about 1 wk ago he started feeling pain on R side of neck, turning or bending neck causes signif increase in pain intensity.  Very stiff feeling after he's been sitting still for a long time.  No radiation of the pain down arm, no paresthesias.  No recent acute illness or fevers.  No HA.  Hard to tell if lidocaine patches he used helped.  Diclofenac gel helps significantly.  Robaxin no help.  He has no hx of neck surgery. X-rays of C spine on 01/26/10 IMPRESSION:  Cervical spondylosis. Mild foraminal encroachment by uncovertebral  osteophytes at C3-4, C4-5 and C5-6. No severe osteophytic  encroachment upon the canal or foramina appreciable. Mild upper  cervical facet degeneration.    Past Medical History:  Diagnosis Date  . AICD (automatic cardioverter/defibrillator) present 2018   MDT CRT-D.  Fatigue-->completely pacer dependent.  Pacer settings adjusted 12/2017 to allow more chronotrophc variance with ADL's//exertion.  . Arthritis    Pt is s/p eft reverse total shoulder arthroplasty.  . Balanitis    chronic fungal  . BPH (benign prostatic hypertrophy) 06/2011   Irritative sx's; pt declined trial of anticholinergic per Urology records  . Chronic combined systolic and diastolic heart failure ([place]) 05/31/2012   Nonischemic:  EF 40-45%, LA mod-severe dilated, AFIB.   02/2016 EF 40%, diffuse hypokinesis, grade 2 DD.  Myoc perf imaging showed EF  32% 04/2016.  Pt upgraded to CRT-D 01/04/17.  [name] Chronic renal insufficiency, stage III (moderate) (HCC) 2015   CrCl about 60 ml/min  . Complete heart block (HCC)    Has dual chamber pacer.  . Depression   . DOE (dyspnea on exertion)    NYHA class II/III CHF  . Dyspnea 2021   with exertion, bending over  . Episodic low back pain 01/22/2013   w/intermittent radiculitis (12/2014 his neurologist referred him to pain mgmt for epidural steroid injection)  . Erectile dysfunction 2019   due to zoloft--urol rx'd viagra  . GERD (gastroesophageal reflux disease)   . H/O tilt table evaluation 11/02/05   negative  . Helicobacter pylori gastritis 01/2016  . History of adenomatous polyp of colon 10/12/11   Dr. [name] (3 right side of colon- tubular adenomas removed)  . History of cardiovascular stress test 05/28/12   no ischemia, EF 37%, imaging results are unchanged and within normal variance  . History of chronic prostatitis   . History of kidney stones   . History of vertigo    + Hx of posterior HA's.  Neuro (Dr. Erling Cruz) eval 2011.  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  . Hyperlipidemia   . Hypertension   . Lumbar spondylosis    lumbosacral radiculopathy at L4 by EMG testing, right foot drop (neurologist is Dr. Linus Salmons with [facility] in [place])--neurologist referred him to neurosurgery  . Migraine    "used  to have them all the time; none for years" (01/04/2017)  . Myocardial infarction [facility]) ?1970s   not entirely certain of this  . Nephrolithiasis 07/2012   Left UVJ 2 mm stone with dilation of renal collecting system and slight hydroureter on right  . Neuropathy   . NICM (nonischemic cardiomyopathy) ([place])    a. 02/2018 Cath: LM nl, LAD min irregs, LCX no, RCA 20d. [id number]. Fick CO/CI 4.4/2.0.  . 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  Since no CAD on  cath 05/2016, cards recommends upgrade to CRT-D.  [name] Permanent atrial fibrillation ([place])    DCCV 07/09/13-converted, lasted two days, then back into afib--needs lifetime anticoagulation (Xarelto as of 09/2014)  . Prostate cancer screening 09/2017   done by urol annually (normal prostate exam documented + PSA 0.84 as of 10/01/17 urol f/u.  10/2018 urol f/u PSA 0.6, no prostate nodule.  . Rectus diastasis   . Right ankle sprain 08/2017   w/distal fibula avulsion fx noted on u/s but not plain film-(Dr. Hudnall).  . Skin cancer of arm, left    "burned it off" (01/04/2017)  . TIA (transient ischemic attack)    L face and L arm weakness. Peri procedural->a. 03/22/2018 following cath. CT head neg. No MRI b/c has pacer. Likely due to embolus to distal branch of RMCA  . Type II diabetes mellitus ([place])     Past Surgical History:  Procedure Laterality Date  . ABI's Bilateral 05/21/2018   normal  . BACK SURGERY    . BIV ICD INSERTION CRT-D N/A 01/04/2017   Procedure: BiV ICD ;  Surgeon: Constance Haw, MD;  Location: [facility];  Service: Cardiovascular;  Laterality: N/A;  . CARDIAC CATHETERIZATION N/A 06/14/2016   Minimal nonobstructive dz, EF 25-35%.  Procedure: Left Heart Cath and Coronary Angiography;  Surgeon: Peter M Martinique, MD;  Location: [facility];  Service: Cardiovascular;  Laterality: N/A;  . CARDIOVASCULAR STRESS TEST  2012   2012 nuclear perfusion study: low risk scan; 04/2016 normal myocardial perfusion imaging, EF 32%.  [name] CARDIOVERSION  07/09/2012   Procedure: CARDIOVERSION;  Surgeon: [name], MD;  Location: [facility];  Service: Cardiovascular;  Laterality: N/A;  . CATARACT EXTRACTION W/ INTRAOCULAR LENS IMPLANT & ANTERIOR VITRECTOMY, BILATERAL Bilateral   . CIRCUMCISION N/A 03/23/2020   Procedure: CIRCUMCISION ADULT;  Surgeon: Remi Haggard, MD;  Location: [facility];  Service: Urology;  Laterality: N/A;  . COLONOSCOPY W/ POLYPECTOMY  approx 2006; repeated  09/2011   Polyps on 2013 EGD as well, repeat 12/2014  . ESOPHAGOGASTRODUODENOSCOPY  10/18/06   Done due to chronic GERD: Normal, bx showed no barrett's esophagus (Dr. [name])  . FLEXOR TENDON REPAIR Left 10/02/2016   Procedure: LEFT RING FINGER WOUND EXPORATION AND FLEXOR TENDON REPAIR AND NERVE REPAIR;  Surgeon: Milly Jakob, MD;  Location: [facility];  Service: Orthopedics;  Laterality: Left;  . INSERT / REPLACE / REMOVE PACEMAKER  02/05/2012   dual chamber, sinus node dysfunction, sinus arrest, PAF, Medtronic Revo serial#-[id number] H: last checked 05/2015  . LUMBAR LAMINECTOMY Left 1976   L4-5  . PACEMAKER REMOVAL  01/04/2017  . PERMANENT PACEMAKER INSERTION N/A 02/05/2012   Procedure: PERMANENT PACEMAKER INSERTION;  Surgeon: [name], MD; Generator Medtronic Revo model [place] serial number [id number] H Laterality: N/A;  . RETINAL DETACHMENT SURGERY Left ~ 1999  . REVERSE SHOULDER ARTHROPLASTY Left 2018   Left shoulder reverse [name] in [place]).  [name]  RIGHT/LEFT HEART CATH AND CORONARY ANGIOGRAPHY N/A 03/22/2018   EF 30-35%, no CAD.  Procedure: RIGHT/LEFT HEART CATH AND CORONARY ANGIOGRAPHY;  Surgeon: Jolaine Artist, MD;  Location: [place] [facility];  Service: Cardiovascular;  Laterality: N/A;  . TRANSTHORACIC ECHOCARDIOGRAM  08/25/10; 05/2012; 03/23/16;12/2017   mild asymmetric LVH, normal systolic function, normal diastolic fxn, mild-to-mod mitral regurg, mild aortic valve sclerosis and trace AI, mild aortic root dilatation. 2014 f/u showed EF 40-45%, mod LAE, A FIB.  02/2016 EF 40%, diffuse hypokinesis, grade 2 DD. 12/2017 EF 35-40%,diffuse hypokin,grd III DD, mild MR  . WISDOM TOOTH EXTRACTION      Outpatient Medications Prior to Visit  Medication Sig Dispense Refill  . acetaminophen (TYLENOL) 325 MG tablet Take 650 mg by mouth every 6 (six) hours as needed for moderate pain.    . BD INSULIN SYRINGE U/F 31G X 5/16" 1 ML MISC USE DAILY AS DIRECTED 100 each 3  . COMBIGAN 0.2-0.5  % ophthalmic solution Place 1 drop into both eyes at bedtime.     . Continuous Blood Gluc Sensor (FREESTYLE LIBRE 14 DAY SENSOR) MISC Check glucose 4 times per day 6 each 3  . Continuous Blood Gluc Sensor (FREESTYLE LIBRE SENSOR SYSTEM) MISC USE TO CHECK BLOOD SUGARS 4 TIMES DAILY. Before breakfast, before lunch, before supper, and at bedtime 1 each 3  . Dulaglutide (TRULICITY) 3 OF/7.5ZW SOPN Inject 3 mg into the skin once a week. 6 mL 3  . ENTRESTO 97-103 MG TAKE 1 TABLET TWO TIMES A DAY 180 tablet 3  . eplerenone (INSPRA) 25 MG tablet TAKE 1 TABLET DAILY 90 tablet 3  . gabapentin (NEURONTIN) 300 MG capsule TAKE 2 CAPSULES TWICE A DAY 360 capsule 3  . insulin aspart (NOVOLOG FLEXPEN) 100 UNIT/ML FlexPen INJECT 6-8 UNITS UNDER THE SKIN AT THE TIME OF EACH MEAL 15 mL 4  . insulin glargine (LANTUS) 100 UNIT/ML injection Inject 0.36-0.4 mLs (36-40 Units total) into the skin daily. INJECT 54 UNITS UNDER THE SKIN DAILY, WILL BE TITRATING DOSE PERIODICALLY 30 mL 3  . Insulin Pen Needle (SURE COMFORT PEN NEEDLES) 31G X 5 MM MISC USE TO INJECT INSULIN UNDER THE SKIN 100 each 11  . JARDIANCE 10 MG TABS tablet TAKE 1 TABLET DAILY 90 tablet 3  . latanoprost (XALATAN) 0.005 % ophthalmic solution Place 1 drop into both eyes at bedtime.   12  . meclizine (ANTIVERT) 25 MG tablet TAKE 1 TABLET THREE TIMES A DAY AS NEEDED FOR DIZZINESS 270 tablet 3  . metoprolol succinate (TOPROL-XL) 50 MG 24 hr tablet TAKE 1 TABLET DAILY. TAKE WITH OR IMMEDIATELY FOLLOWING A MEAL (Patient taking differently: Take by mouth daily. Pt taking 1/2 tablet (25 mg) daily) 90 tablet 0  . omeprazole (PRILOSEC) 40 MG capsule Take 1 capsule (40 mg total) by mouth daily. 90 capsule 1  . oxybutynin (DITROPAN-XL) 5 MG 24 hr tablet TAKE 1 TABLET AT BEDTIME 90 tablet 3  . rOPINIRole (REQUIP) 1 MG tablet TAKE 1 TABLET DAILY 90 tablet 3  . rosuvastatin (CRESTOR) 20 MG tablet Take 1 tablet (20 mg total) by mouth daily. 90 tablet 3  . sertraline  (ZOLOFT) 100 MG tablet TAKE 1 TABLET DAILY 90 tablet 1  . XARELTO 20 MG TABS tablet TAKE 1 TABLET DAILY WITH SUPPER (Patient taking differently: Take 20 mg by mouth daily with supper.) 90 tablet 3  . diclofenac Sodium (VOLTAREN) 1 % GEL Apply 4 g topically 4 (four) times daily. 100 g 1  .  methocarbamol (ROBAXIN) 500 MG tablet Take 1 tablet (500 mg total) by mouth 2 (two) times daily. 10 tablet 0  . oxyCODONE-acetaminophen (PERCOCET) 5-325 MG tablet Take 1 tablet by mouth every 4 (four) hours as needed for severe pain. (Patient not taking: No sig reported) 20 tablet 0  . triamcinolone (KENALOG) 0.1 % SMARTSIG:1 Application Topical 2-3 Times Daily (Patient not taking: Reported on 12/17/2020)    . glucose blood test strip Use to check blood sugars 1-2 times daily (Patient not taking: No sig reported) 100 each 3  . Lancets (FREESTYLE) lancets USE AS INSTRUCTED (Patient not taking: No sig reported) 100 each 11  . lidocaine (LIDODERM) 5 % Place 1 patch onto the skin daily. Remove & Discard patch within 12 hours or as directed by MD (Patient not taking: Reported on 12/17/2020) 30 patch 0   No facility-administered medications prior to visit.    No Known Allergies  ROS As per HPI  PE: Vitals with BMI 12/17/2020 12/12/2020 12/12/2020  Height 5\' 11"  - 5\' 11"   Weight 237 lbs 13 oz - 238 lbs  BMI 25.36 - 64.40  Systolic 98 347 -  Diastolic 65 82 -  Pulse 73 69 -     Gen: Alert, well appearing.  Patient is oriented to person, place, time, and situation. AFFECT: pleasant, lucid thought and speech. Mild TTP over R sternocleidomastoid mm, milder diffuse tenderness over upper trap on R and R posterior cervical soft tissues.  Pain with ROM of c spine w/ rotation and lateral bending to L. UE strength 5/5 prox/dist, no sensory deficit.   LABS:    Chemistry      Component Value Date/Time   NA 136 10/04/2020 1412   NA 139 12/28/2016 1405   K 4.6 10/04/2020 1412   CL 103 10/04/2020 1412   CO2 23  10/04/2020 1412   BUN 23 10/04/2020 1412   BUN 25 12/28/2016 1405   CREATININE 1.34 10/04/2020 1412   CREATININE 1.21 (H) 06/06/2016 1312      Component Value Date/Time   CALCIUM 9.3 10/04/2020 1412   ALKPHOS 84 10/04/2020 1412   AST 15 10/04/2020 1412   ALT 12 10/04/2020 1412   BILITOT 0.5 10/04/2020 1412     Lab Results  Component Value Date   HGBA1C 6.7 (A) 12/09/2020   IMPRESSION AND PLAN:  Musculoskeletal neck pain, focused in sternocleidomastoid for the most part. Discussed ongoing care with heating pad, lidocaine patches, diclofenac gel, and will d/c robaxin and try flexeril 5-10mg  tid prn.   HOme ROM exercised demonstrated and emphasized. Mentioned formal PT as an option and he declined for now.  An After Visit Summary was printed and given to the patient.  FOLLOW UP: Return if symptoms worsen or fail to improve.  Signed:  Crissie Sickles, MD           12/17/2020

## 2021-01-05 ENCOUNTER — Encounter: Payer: Self-pay | Admitting: Family Medicine

## 2021-01-05 ENCOUNTER — Telehealth: Payer: Self-pay

## 2021-01-05 DIAGNOSIS — U071 COVID-19: Secondary | ICD-10-CM

## 2021-01-05 HISTORY — DX: COVID-19: U07.1

## 2021-01-05 NOTE — Telephone Encounter (Signed)
Please Advise

## 2021-01-05 NOTE — Telephone Encounter (Signed)
I need to know when his sx's started in order to determine if he qualifies for oral antiviral for covid--thx

## 2021-01-05 NOTE — Telephone Encounter (Signed)
Patients states he did home test and results were COVID+. His symptoms are: sore throat, headache, fever, cough - when he coughs he feels as if head is going to explode. Patient requesting info on what to do next for treatment   Please call home [phone]

## 2021-01-05 NOTE — Telephone Encounter (Signed)
LM for pt to return call regarding symptoms.

## 2021-01-05 NOTE — Telephone Encounter (Signed)
Noted  

## 2021-01-05 NOTE — Telephone Encounter (Signed)
FYI,  Spoke with pt's wife, he went to [facility] today and tested positive. He was given paxlovid medication. Note in care everywhere

## 2021-01-26 ENCOUNTER — Ambulatory Visit (INDEPENDENT_AMBULATORY_CARE_PROVIDER_SITE_OTHER): Payer: Medicare Other

## 2021-01-26 DIAGNOSIS — I428 Other cardiomyopathies: Secondary | ICD-10-CM

## 2021-01-26 LAB — CUP PACEART REMOTE DEVICE CHECK
Battery Remaining Longevity: 31 mo
Battery Voltage: 2.95 V
Brady Statistic AP VP Percent: 0 %
Brady Statistic AP VS Percent: 0 %
Brady Statistic AS VP Percent: 0 %
Brady Statistic AS VS Percent: 0 %
Brady Statistic RA Percent Paced: 0 %
Brady Statistic RV Percent Paced: 97.94 %
Date Time Interrogation Session: 20220705033426
HighPow Impedance: 71 Ohm
Implantable Lead Implant Date: 20130715
Implantable Lead Implant Date: 20180614
Implantable Lead Implant Date: 20180614
Implantable Lead Location: 753858
Implantable Lead Location: 753859
Implantable Lead Location: 753860
Implantable Lead Model: 4598
Implantable Pulse Generator Implant Date: 20180614
Lead Channel Impedance Value: 188.1 Ohm
Lead Channel Impedance Value: 188.1 Ohm
Lead Channel Impedance Value: 198.837
Lead Channel Impedance Value: 222.34 Ohm
Lead Channel Impedance Value: 222.34 Ohm
Lead Channel Impedance Value: 304 Ohm
Lead Channel Impedance Value: 342 Ohm
Lead Channel Impedance Value: 418 Ohm
Lead Channel Impedance Value: 418 Ohm
Lead Channel Impedance Value: 475 Ohm
Lead Channel Impedance Value: 475 Ohm
Lead Channel Impedance Value: 475 Ohm
Lead Channel Impedance Value: 532 Ohm
Lead Channel Impedance Value: 646 Ohm
Lead Channel Impedance Value: 646 Ohm
Lead Channel Impedance Value: 760 Ohm
Lead Channel Impedance Value: 760 Ohm
Lead Channel Impedance Value: 779 Ohm
Lead Channel Pacing Threshold Amplitude: 0.375 V
Lead Channel Pacing Threshold Amplitude: 0.625 V
Lead Channel Pacing Threshold Pulse Width: 0.4 ms
Lead Channel Pacing Threshold Pulse Width: 1 ms
Lead Channel Sensing Intrinsic Amplitude: 0.625 mV
Lead Channel Sensing Intrinsic Amplitude: 20.5 mV
Lead Channel Sensing Intrinsic Amplitude: 20.5 mV
Lead Channel Setting Pacing Amplitude: 1 V
Lead Channel Setting Pacing Amplitude: 2 V
Lead Channel Setting Pacing Pulse Width: 0.4 ms
Lead Channel Setting Pacing Pulse Width: 1 ms
Lead Channel Setting Sensing Sensitivity: 0.3 mV

## 2021-01-28 ENCOUNTER — Other Ambulatory Visit: Payer: Self-pay | Admitting: Family Medicine

## 2021-02-14 ENCOUNTER — Ambulatory Visit (INDEPENDENT_AMBULATORY_CARE_PROVIDER_SITE_OTHER): Payer: Medicare Other | Admitting: Ophthalmology

## 2021-02-14 ENCOUNTER — Encounter (INDEPENDENT_AMBULATORY_CARE_PROVIDER_SITE_OTHER): Payer: Self-pay | Admitting: Ophthalmology

## 2021-02-14 ENCOUNTER — Other Ambulatory Visit: Payer: Self-pay

## 2021-02-14 DIAGNOSIS — H20042 Secondary noninfectious iridocyclitis, left eye: Secondary | ICD-10-CM | POA: Diagnosis not present

## 2021-02-14 DIAGNOSIS — H401131 Primary open-angle glaucoma, bilateral, mild stage: Secondary | ICD-10-CM | POA: Diagnosis not present

## 2021-02-14 DIAGNOSIS — T8529XD Other mechanical complication of intraocular lens, subsequent encounter: Secondary | ICD-10-CM | POA: Diagnosis not present

## 2021-02-14 DIAGNOSIS — I2510 Atherosclerotic heart disease of native coronary artery without angina pectoris: Secondary | ICD-10-CM

## 2021-02-14 DIAGNOSIS — H35372 Puckering of macula, left eye: Secondary | ICD-10-CM

## 2021-02-14 DIAGNOSIS — H2012 Chronic iridocyclitis, left eye: Secondary | ICD-10-CM

## 2021-02-14 MED ORDER — KETOROLAC TROMETHAMINE 0.5 % OP SOLN: 1.0000 [drp] | Freq: Two times a day (BID) | OPHTHALMIC | 1 refills | Status: AC

## 2021-02-14 NOTE — Assessment & Plan Note (Signed)
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  This could cause the intermittent inflammatory response with dispersed debris clouding the patient's vision in addition to the lens movement itself.

## 2021-02-14 NOTE — Progress Notes (Signed)
02/14/2021     CHIEF COMPLAINT Patient presents for Blurred Vision [facility]- blurry vision. /Pt states, "I woke up on Saturday and my left eye was very blurry and it keeps getting almost worse. It just happened all of a sudden."/LBS: 128/Pt reports using Latanoprost QHS OU/)   HISTORY OF PRESENT ILLNESS: Mason [name] is a 80 y.o. male who presents to the clinic today for:   HPI     Blurred Vision           Laterality: left eye   Onset: sudden   Quality: blurred, difficult to focus and hazy   Severity: moderate   Onset: 2 days ago   Frequency: constantly   Timing: throughout the day   Course: stable   Associated symptoms: glare   Treatments tried: no treatments   Comments: WIP- blurry vision.  Pt states, "I woke up on Saturday and my left eye was very blurry and it keeps getting almost worse. It just happened all of a sudden." LBS: 128 Pt reports using Latanoprost QHS OU        Last edited by Kendra Opitz, COA on 02/14/2021  2:46 PM.      Referring physician: Clent Jacks, MD [address] [place],  [place] [address]  HISTORICAL INFORMATION:   Selected notes from the MEDICAL RECORD NUMBER    Lab Results  Component Value Date   HGBA1C 6.7 (A) 12/09/2020     CURRENT MEDICATIONS: Current Outpatient Medications (Ophthalmic Drugs)  Medication Sig   COMBIGAN 0.2-0.5 % ophthalmic solution Place 1 drop into both eyes at bedtime.    latanoprost (XALATAN) 0.005 % ophthalmic solution Place 1 drop into both eyes at bedtime.    No current facility-administered medications for this visit. (Ophthalmic Drugs)   Current Outpatient Medications (Other)  Medication Sig   acetaminophen (TYLENOL) 325 MG tablet Take 650 mg by mouth every 6 (six) hours as needed for moderate pain.   BD INSULIN SYRINGE U/F 31G X 5/16" 1 ML MISC USE DAILY AS DIRECTED   Continuous Blood Gluc Sensor (FREESTYLE LIBRE 14 DAY SENSOR) MISC Check glucose 4 times per day   Continuous Blood Gluc Sensor  (FREESTYLE LIBRE SENSOR SYSTEM) MISC USE TO CHECK BLOOD SUGARS 4 TIMES DAILY. Before breakfast, before lunch, before supper, and at bedtime   cyclobenzaprine (FLEXERIL) 10 MG tablet 1/2-1 tab po tid prn muscle spasms   diclofenac Sodium (VOLTAREN) 1 % GEL Apply 4 g topically 4 (four) times daily.   Dulaglutide (TRULICITY) 3 0000000 SOPN Inject 3 mg into the skin once a week.   ENTRESTO 97-103 MG TAKE 1 TABLET TWO TIMES A DAY   eplerenone (INSPRA) 25 MG tablet TAKE 1 TABLET DAILY   gabapentin (NEURONTIN) 300 MG capsule TAKE 2 CAPSULES TWICE A DAY   insulin aspart (NOVOLOG FLEXPEN) 100 UNIT/ML FlexPen INJECT 6-8 UNITS UNDER THE SKIN AT THE TIME OF EACH MEAL   insulin glargine (LANTUS) 100 UNIT/ML injection Inject 0.36-0.4 mLs (36-40 Units total) into the skin daily. INJECT 54 UNITS UNDER THE SKIN DAILY, WILL BE TITRATING DOSE PERIODICALLY   Insulin Pen Needle (SURE COMFORT PEN NEEDLES) 31G X 5 MM MISC USE TO INJECT INSULIN UNDER THE SKIN   JARDIANCE 10 MG TABS tablet TAKE 1 TABLET DAILY   lidocaine (LIDODERM) 5 % Place 1 patch onto the skin daily. Remove & Discard patch within 12 hours or as directed by MD   meclizine (ANTIVERT) 25 MG tablet TAKE 1 TABLET  THREE TIMES A DAY AS NEEDED FOR DIZZINESS   metoprolol succinate (TOPROL-XL) 50 MG 24 hr tablet TAKE 1 TABLET DAILY. TAKE WITH OR IMMEDIATELY FOLLOWING A MEAL (Patient taking differently: Take by mouth daily. Pt taking 1/2 tablet (25 mg) daily)   omeprazole (PRILOSEC) 40 MG capsule Take 1 capsule (40 mg total) by mouth daily.   oxybutynin (DITROPAN-XL) 5 MG 24 hr tablet TAKE 1 TABLET AT BEDTIME   oxyCODONE-acetaminophen (PERCOCET) 5-325 MG tablet Take 1 tablet by mouth every 4 (four) hours as needed for severe pain. (Patient not taking: No sig reported)   rOPINIRole (REQUIP) 1 MG tablet TAKE 1 TABLET DAILY   rosuvastatin (CRESTOR) 20 MG tablet Take 1 tablet (20 mg total) by mouth daily.   sertraline (ZOLOFT) 100 MG tablet TAKE 1 TABLET DAILY    triamcinolone (KENALOG) 0.1 % SMARTSIG:1 Application Topical 2-3 Times Daily (Patient not taking: Reported on 12/17/2020)   XARELTO 20 MG TABS tablet TAKE 1 TABLET DAILY WITH SUPPER (Patient taking differently: Take 20 mg by mouth daily with supper.)   No current facility-administered medications for this visit. (Other)      REVIEW OF SYSTEMS:    ALLERGIES No Known Allergies  PAST MEDICAL HISTORY Past Medical History:  Diagnosis Date   AICD (automatic cardioverter/defibrillator) present 2018   MDT CRT-D.  Fatigue-->completely pacer dependent.  Pacer settings adjusted 12/2017 to allow more chronotrophc variance with ADL's//exertion.   Arthritis    Pt is s/p eft reverse total shoulder arthroplasty.   Balanitis    chronic fungal   BPH (benign prostatic hypertrophy) 06/2011   Irritative sx's; pt declined trial of anticholinergic per Urology records   Chronic combined systolic and diastolic heart failure ([place]) 05/31/2012   Nonischemic:  EF 40-45%, LA mod-severe dilated, AFIB.   02/2016 EF 40%, diffuse hypokinesis, grade 2 DD.  Myoc perf imaging showed EF 32% 04/2016.  Pt upgraded to CRT-D 01/04/17.   Chronic renal insufficiency, stage III (moderate) (HCC) 2015   CrCl about 60 ml/min   Complete heart block (HCC)    Has dual chamber pacer.   COVID-19 virus infection 01/05/2021   paxlovid   Depression    DOE (dyspnea on exertion)    NYHA class II/III CHF   Dyspnea 2021   with exertion, bending over   Episodic low back pain 01/22/2013   w/intermittent radiculitis (12/2014 his neurologist referred him to pain mgmt for epidural steroid injection)   Erectile dysfunction 2019   due to zoloft--urol rx'd viagra   GERD (gastroesophageal reflux disease)    H/O tilt table evaluation [id number]   negative   Helicobacter pylori gastritis 01/2016   History of adenomatous polyp of colon 10/12/2011   Dr. [name] (3 right side of colon- tubular adenomas removed)   History of cardiovascular stress  test 05/28/2012   no ischemia, EF 37%, imaging results are unchanged and within normal variance   History of chronic prostatitis    History of kidney stones    History of vertigo    + Hx of posterior HA's.  Neuro (Dr. Erling Cruz) eval 2011.  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   Hyperlipidemia    Hypertension    Lumbar spondylosis    lumbosacral radiculopathy at L4 by EMG testing, right foot drop (neurologist is Dr. Linus Salmons with [facility] in [place])--neurologist referred him to neurosurgery   Migraine    "used to have them all the time; none for years" (01/04/2017)  Myocardial infarction [facility]) ?1970s   not entirely certain of this   Nephrolithiasis 07/2012   Left UVJ 2 mm stone with dilation of renal collecting system and slight hydroureter on right   Neuropathy    NICM (nonischemic cardiomyopathy) ([place])    a. 02/2018 Cath: LM nl, LAD min irregs, LCX no, RCA 20d. [place]:[id number]. [place] CO/CI 4.4/2.0.   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  Since no CAD on cath 05/2016, cards recommends upgrade to CRT-D.   Permanent atrial fibrillation ([place])    DCCV 07/09/13-converted, lasted two days, then back into afib--needs lifetime anticoagulation (Xarelto as of 09/2014)   Prostate cancer screening 09/2017   done by urol annually (normal prostate exam documented + PSA 0.84 as of 10/01/17 urol f/u.  10/2018 urol f/u PSA 0.6, no prostate nodule.   Rectus diastasis    Right ankle sprain 08/2017   w/distal fibula avulsion fx noted on u/s but not plain film-(Dr. Hudnall).   Skin cancer of arm, left    "burned it off" (01/04/2017)   TIA (transient ischemic attack)    L face and L arm weakness. Peri procedural->a. 03/22/2018 following cath. CT head neg. No MRI b/c has pacer. Likely due to embolus to distal branch of RMCA   Type II diabetes mellitus ([place])    Past  Surgical History:  Procedure Laterality Date   ABI's Bilateral 05/21/2018   normal   BACK SURGERY     BIV ICD INSERTION CRT-D N/A 01/04/2017   Procedure: BiV ICD ;  Surgeon: Constance Haw, MD;  Location: [place];  Service: Cardiovascular;  Laterality: N/A;   CARDIAC CATHETERIZATION N/A 06/14/2016   Minimal nonobstructive dz, EF 25-35%.  Procedure: Left Heart Cath and Coronary Angiography;  Surgeon: Peter M Martinique, MD;  Location: [place] [facility];  Service: Cardiovascular;  Laterality: N/A;   CARDIOVASCULAR STRESS TEST  2012   2012 nuclear perfusion study: low risk scan; 04/2016 normal myocardial perfusion imaging, EF 32%.   CARDIOVERSION  07/09/2012   Procedure: CARDIOVERSION;  Surgeon: [name], MD;  Location: [facility];  Service: Cardiovascular;  Laterality: N/A;   CATARACT EXTRACTION W/ INTRAOCULAR LENS IMPLANT & ANTERIOR VITRECTOMY, BILATERAL Bilateral    CIRCUMCISION N/A 03/23/2020   Procedure: CIRCUMCISION ADULT;  Surgeon: Remi Haggard, MD;  Location: [facility];  Service: Urology;  Laterality: N/A;   COLONOSCOPY W/ POLYPECTOMY  approx 2006; repeated 09/2011   Polyps on 2013 EGD as well, repeat 12/2014   ESOPHAGOGASTRODUODENOSCOPY  10/18/06   Done due to chronic GERD: Normal, bx showed no barrett's esophagus (Dr. [name])   [place] Left 10/02/2016   Procedure: LEFT RING FINGER WOUND EXPORATION AND FLEXOR TENDON REPAIR AND NERVE REPAIR;  Surgeon: Milly Jakob, MD;  Location: [facility];  Service: Orthopedics;  Laterality: Left;   INSERT / REPLACE / REMOVE PACEMAKER  02/05/2012   dual chamber, sinus node dysfunction, sinus arrest, PAF, Medtronic Revo serial#-[id number] H: last checked 05/2015   LUMBAR LAMINECTOMY Left 1976   L4-5   PACEMAKER REMOVAL  01/04/2017   PERMANENT PACEMAKER INSERTION N/A 02/05/2012   Procedure: PERMANENT PACEMAKER INSERTION;  Surgeon: [name], MD; Generator Medtronic [place] model [place] serial number [id number] H Laterality: N/A;    RETINAL DETACHMENT SURGERY Left ~ [place] Left 2018   Left shoulder reverse [name] in [place]).   RIGHT/LEFT HEART CATH AND CORONARY ANGIOGRAPHY N/A 03/22/2018   EF 30-35%,  no CAD.  Procedure: RIGHT/LEFT HEART CATH AND CORONARY ANGIOGRAPHY;  Surgeon: Jolaine Artist, MD;  Location: [place];  Service: Cardiovascular;  Laterality: N/A;   TRANSTHORACIC ECHOCARDIOGRAM  08/25/10; 05/2012; 03/23/16;12/2017   mild asymmetric LVH, normal systolic function, normal diastolic fxn, mild-to-mod mitral regurg, mild aortic valve sclerosis and trace AI, mild aortic root dilatation. 2014 f/u showed EF 40-45%, mod LAE, A FIB.  02/2016 EF 40%, diffuse hypokinesis, grade 2 DD. 12/2017 EF 35-40%,diffuse hypokin,grd III DD, mild MR   WISDOM TOOTH EXTRACTION      FAMILY HISTORY Family History  Problem Relation Age of Onset   Heart failure Mother    Stroke Mother    Stroke Father    Heart disease Sister    Heart disease Sister    Cancer Sister        liver   Cancer Brother        lung   Cancer Brother        lung   Heart disease Brother     SOCIAL HISTORY Social History   Tobacco Use   Smoking status: Never   Smokeless tobacco: Never  Vaping Use   Vaping Use: Never used  Substance Use Topics   Alcohol use: Yes    Comment: occ   Drug use: No         OPHTHALMIC EXAM:  Base Eye Exam     Visual Acuity (ETDRS)       Right Left   Dist [place] 20/30 -1 20/50 -1   Dist ph [place] 20/25          Tonometry (Tonopen, 2:50 PM)       Right Left   Pressure 14 15         Pupils       Pupils Dark Light Shape React APD   Right PERRL 5 4 Round Brisk None   Left PERRL 5 4 Round Brisk None         Visual Fields       Left Right   Restrictions  Partial outer superior nasal deficiency         Extraocular Movement       Right Left    Full Full         Neuro/Psych     Oriented x3: Yes   Mood/Affect: Normal          Dilation     Both eyes: 1.0% Mydriacyl, 2.5% Phenylephrine @ 2:50 PM           Slit Lamp and Fundus Exam     External Exam       Right Left   External Normal Normal         Slit Lamp Exam       Right Left   Lids/Lashes Normal Normal   Conjunctiva/Sclera White and quiet White and quiet   Cornea Clear Clear   Anterior Chamber Deep and quiet Deep,, 1+ Cell, no fibrin, 1+ Flare, trace pigment   Iris Round and reactive Round and reactive   Lens Posterior chamber intraocular lens Posterior chamber intraocular lens, with Pseudophakodonesis present, lens slightly tilted loose temporally   Anterior Vitreous Normal Pigment and trace cells         Fundus Exam       Right Left   Posterior Vitreous Posterior vitreous detachment Posterior vitreous detachment, Central vitreous floaters, 1+ cell, RBCs   Disc Normal Normal   C/D Ratio 0.65 0.7   Macula  Normal No topographic distortion   Vessels Normal, , no DR Normal, , no DR   Periphery Normal Good retinopexy temporally, good buckle            IMAGING AND PROCEDURES  Imaging and Procedures for 02/14/21  OCT, Retina - OU - Both Eyes       Right Eye Quality was good. Central Foveal Thickness: 325. Progression has been stable. Findings include normal foveal contour, retinal drusen , no IRF, no SRF.   Left Eye Quality was good. Scan locations included subfoveal. Central Foveal Thickness: 302. Progression has been stable. Findings include epiretinal membrane, abnormal foveal contour, retinal drusen , no IRF, no SRF.   Notes OS with minor epiretinal membrane temporal to the fovea, with no foveal distortion will observe.  Minor to moderate vitreous debris left eye  OD with drusen and drusenoid deposits subfoveal.  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  Recurrence of inflammatory and pigmentary debris release  left eye spontaneous upon awakening the morning with variable fluctuation of vision however the patient does state he periodically mash and rubs the eye.  I really encouraged him not to do this because of the inflammatory nature of this issue.  The lens striking the back surface of the iris however could create an inflammatory situation which could necessitate vitrectomy, intraocular lens removal and placement of posterior intraocular lens using scleral tunnel technique via Yamane scleral tunnel, in order to restore pseudophakic condition OS may be sustaining some form of UGH syndrome, uveitis-glaucoma-hyphema, secondary to IOL movement with Pseudophakodonesis.  This could cause the intermittent inflammatory response with dispersed debris clouding the patient's vision in addition to the lens movement itself.   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  This could cause the intermittent inflammatory response with dispersed debris clouding the patient's vision in addition to the lens movement itself.     ICD-10-CM   1. Pseudophakodonesis, subsequent encounter  T85.29XD     2. 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 Left epiretinal membrane  H35.372 OCT, Retina - OU - Both Eyes      1.  Do not rub compress or match the left eye.  I explained to the patient this can change the internal anatomy as well as trigger further inflammation with iris chafe and possibly, theoretically, trigger further subluxation and potential dislocation of the lens which is poorly supported at time at this time  2.  OS we will use low-dose NSAID, ketorolac twice daily simply to curtail the inflammation.  We will avoid topical steroids given the patient's history of glaucoma, 2 medications to control  3.  Once inflammation in the left eye is curtailed, may have to  reevaluate and consider whether the movement of the lens is now triggering visual difficulties that would prompt the patient to need to consider vitrectomy, intraocular lens removal and/or stabilization with suturing and/or placement of a second intraocular lens via a scleral tunnel Margaree Mackintosh in order to provide him stabilization of his pseudophakic condition long-term  Ophthalmic Meds Ordered this visit:  No orders of the defined types were placed in this encounter.      Return in about 3 weeks (around 03/07/2021)  for COLOR FP, dilate, OS.  There are no Patient Instructions on file for this visit.   Explained the diagnoses, plan, and follow up with the patient and they expressed understanding.  Patient expressed understanding of the importance of proper follow up care.   Clent Demark Kaceton Vieau M.D. Diseases & Surgery of the Retina and Vitreous Retina & Diabetic [place] 02/14/21     Abbreviations: M myopia (nearsighted); A astigmatism; H hyperopia (farsighted); P presbyopia; Mrx spectacle prescription;  CTL contact lenses; OD right eye; OS left eye; OU both eyes  XT exotropia; ET esotropia; PEK punctate epithelial keratitis; PEE punctate epithelial erosions; DES dry eye syndrome; MGD meibomian gland dysfunction; ATs artificial tears; PFAT's preservative free artificial tears; [place] nuclear sclerotic cataract; PSC posterior subcapsular cataract; ERM epi-retinal membrane; PVD posterior vitreous detachment; RD retinal detachment; DM diabetes mellitus; DR diabetic retinopathy; NPDR non-proliferative diabetic retinopathy; PDR proliferative diabetic retinopathy; CSME clinically significant macular edema; DME diabetic macular edema; dbh dot blot hemorrhages; CWS cotton wool spot; POAG primary open angle glaucoma; C/D cup-to-disc ratio; HVF humphrey visual field; GVF goldmann visual field; OCT optical coherence tomography; IOP intraocular pressure; BRVO Branch retinal vein occlusion; CRVO central retinal vein  occlusion; CRAO central retinal artery occlusion; BRAO branch retinal artery occlusion; RT retinal tear; SB scleral buckle; PPV pars plana vitrectomy; VH Vitreous hemorrhage; PRP panretinal laser photocoagulation; IVK intravitreal kenalog; VMT vitreomacular traction; MH Macular hole;  NVD neovascularization of the disc; NVE neovascularization elsewhere; AREDS age related eye disease study; ARMD age related macular degeneration; POAG primary open angle glaucoma; EBMD epithelial/anterior basement membrane dystrophy; ACIOL anterior chamber intraocular lens; IOL intraocular lens; PCIOL posterior chamber intraocular lens; Phaco/IOL phacoemulsification with intraocular lens placement; [place] photorefractive keratectomy; LASIK laser assisted in situ keratomileusis; HTN hypertension; DM diabetes mellitus; COPD chronic obstructive pulmonary disease

## 2021-02-14 NOTE — Assessment & Plan Note (Addendum)
Pseudophakodonesis with best corrected visual acuity in 2021 of 20/30.  Recurrence of inflammatory and pigmentary debris release left eye spontaneous upon awakening the morning with variable fluctuation of vision however the patient does state he periodically mash and rubs the eye.  I really encouraged him not to do this because of the inflammatory nature of this issue.  The lens striking the back surface of the iris however could create an inflammatory situation which could necessitate vitrectomy, intraocular lens removal and placement of posterior intraocular lens using scleral tunnel technique via Yamane scleral tunnel, in order to restore pseudophakic condition OS may be sustaining some form of UGH syndrome, uveitis-glaucoma-hyphema, secondary to IOL movement with Pseudophakodonesis.  This could cause the intermittent inflammatory response with dispersed debris clouding the patient's vision in addition to the lens movement itself.

## 2021-02-16 NOTE — Progress Notes (Signed)
Remote ICD transmission.   

## 2021-03-07 ENCOUNTER — Encounter (INDEPENDENT_AMBULATORY_CARE_PROVIDER_SITE_OTHER): Payer: Self-pay | Admitting: Ophthalmology

## 2021-03-07 ENCOUNTER — Other Ambulatory Visit: Payer: Self-pay

## 2021-03-07 ENCOUNTER — Ambulatory Visit (INDEPENDENT_AMBULATORY_CARE_PROVIDER_SITE_OTHER): Payer: Medicare Other | Admitting: Ophthalmology

## 2021-03-07 DIAGNOSIS — H2012 Chronic iridocyclitis, left eye: Secondary | ICD-10-CM

## 2021-03-07 DIAGNOSIS — I2510 Atherosclerotic heart disease of native coronary artery without angina pectoris: Secondary | ICD-10-CM

## 2021-03-07 DIAGNOSIS — H353132 Nonexudative age-related macular degeneration, bilateral, intermediate dry stage: Secondary | ICD-10-CM | POA: Diagnosis not present

## 2021-03-07 DIAGNOSIS — H401131 Primary open-angle glaucoma, bilateral, mild stage: Secondary | ICD-10-CM

## 2021-03-07 DIAGNOSIS — T8529XD Other mechanical complication of intraocular lens, subsequent encounter: Secondary | ICD-10-CM

## 2021-03-07 NOTE — Assessment & Plan Note (Signed)
Mild vitreous hemorrhage OS probably associated with phacodonesis and iris chafe yet no active inflammation seen today mild vitreous hemorrhage clearing.  The pseudo phacodonesis in the left eye, is visually not troubling the patient at most of the time during the day.  I thus recommend simple observation and surgical approach to repair of this condition with lens exchange placement of Yamane scleral tunnel IOL,  PCIOL should the current lens completely dislocate

## 2021-03-07 NOTE — Assessment & Plan Note (Signed)
Stable at present.   

## 2021-03-07 NOTE — Assessment & Plan Note (Signed)
Resolved today. 

## 2021-03-07 NOTE — Assessment & Plan Note (Signed)
Continue on therapy as per [facility], Nathanial Rancher

## 2021-03-07 NOTE — Patient Instructions (Addendum)
Patient understands critical portions of never compressing mashing or rubbing the eye, particularly the left resume further stress the loose attachments of the lens placement in the left eye.  Patient to report promptly should profound vision loss or or out of focus vision develop in the left eye which might indicate the lens has fully dislocated and is now floating around in the eye

## 2021-03-07 NOTE — Progress Notes (Signed)
03/07/2021     CHIEF COMPLAINT Patient presents for Retina Follow Up   HISTORY OF PRESENT ILLNESS: Mason [name] is a 80 y.o. male who presents to the clinic today for:   HPI     Retina Follow Up           Diagnosis: Other   Laterality: left eye   Onset: 3 weeks ago   Severity: mild   Duration: 3 weeks   Course: stable         Comments   3 weeks fu OS OCT/FP Pt states VA OU stable since last visit. Pt denies FOL, floaters, or ocular pain OU.  Pt states, "Bright lights are still bothering me." I used my last drop on Saturday.      Last edited by Kendra Opitz, COA on 03/07/2021  3:14 PM.      Referring physician: Clent Jacks, MD [address] [place],  [place] [address]  HISTORICAL INFORMATION:   Selected notes from the MEDICAL RECORD NUMBER    Lab Results  Component Value Date   HGBA1C 6.7 (A) 12/09/2020     CURRENT MEDICATIONS: Current Outpatient Medications (Ophthalmic Drugs)  Medication Sig   COMBIGAN 0.2-0.5 % ophthalmic solution Place 1 drop into both eyes at bedtime.    ketorolac (ACULAR) 0.5 % ophthalmic solution Place 1 drop into the left eye 2 (two) times daily.   latanoprost (XALATAN) 0.005 % ophthalmic solution Place 1 drop into both eyes at bedtime.    No current facility-administered medications for this visit. (Ophthalmic Drugs)   Current Outpatient Medications (Other)  Medication Sig   acetaminophen (TYLENOL) 325 MG tablet Take 650 mg by mouth every 6 (six) hours as needed for moderate pain.   BD INSULIN SYRINGE U/F 31G X 5/16" 1 ML MISC USE DAILY AS DIRECTED   Continuous Blood Gluc Sensor (FREESTYLE LIBRE 14 DAY SENSOR) MISC Check glucose 4 times per day   Continuous Blood Gluc Sensor (FREESTYLE LIBRE SENSOR SYSTEM) MISC USE TO CHECK BLOOD SUGARS 4 TIMES DAILY. Before breakfast, before lunch, before supper, and at bedtime   cyclobenzaprine (FLEXERIL) 10 MG tablet 1/2-1 tab po tid prn muscle spasms   diclofenac Sodium (VOLTAREN)  1 % GEL Apply 4 g topically 4 (four) times daily.   Dulaglutide (TRULICITY) 3 0000000 SOPN Inject 3 mg into the skin once a week.   ENTRESTO 97-103 MG TAKE 1 TABLET TWO TIMES A DAY   eplerenone (INSPRA) 25 MG tablet TAKE 1 TABLET DAILY   gabapentin (NEURONTIN) 300 MG capsule TAKE 2 CAPSULES TWICE A DAY   insulin aspart (NOVOLOG FLEXPEN) 100 UNIT/ML FlexPen INJECT 6-8 UNITS UNDER THE SKIN AT THE TIME OF EACH MEAL   insulin glargine (LANTUS) 100 UNIT/ML injection Inject 0.36-0.4 mLs (36-40 Units total) into the skin daily. INJECT 54 UNITS UNDER THE SKIN DAILY, WILL BE TITRATING DOSE PERIODICALLY   Insulin Pen Needle (SURE COMFORT PEN NEEDLES) 31G X 5 MM MISC USE TO INJECT INSULIN UNDER THE SKIN   JARDIANCE 10 MG TABS tablet TAKE 1 TABLET DAILY   lidocaine (LIDODERM) 5 % Place 1 patch onto the skin daily. Remove & Discard patch within 12 hours or as directed by MD   meclizine (ANTIVERT) 25 MG tablet TAKE 1 TABLET THREE TIMES A DAY AS NEEDED FOR DIZZINESS   metoprolol succinate (TOPROL-XL) 50 MG 24 hr tablet TAKE 1 TABLET DAILY. TAKE WITH OR IMMEDIATELY FOLLOWING A MEAL (Patient taking differently: Take by mouth daily.  Pt taking 1/2 tablet (25 mg) daily)   omeprazole (PRILOSEC) 40 MG capsule Take 1 capsule (40 mg total) by mouth daily.   oxybutynin (DITROPAN-XL) 5 MG 24 hr tablet TAKE 1 TABLET AT BEDTIME   oxyCODONE-acetaminophen (PERCOCET) 5-325 MG tablet Take 1 tablet by mouth every 4 (four) hours as needed for severe pain. (Patient not taking: No sig reported)   rOPINIRole (REQUIP) 1 MG tablet TAKE 1 TABLET DAILY   rosuvastatin (CRESTOR) 20 MG tablet Take 1 tablet (20 mg total) by mouth daily.   sertraline (ZOLOFT) 100 MG tablet TAKE 1 TABLET DAILY   triamcinolone (KENALOG) 0.1 % SMARTSIG:1 Application Topical 2-3 Times Daily (Patient not taking: Reported on 12/17/2020)   XARELTO 20 MG TABS tablet TAKE 1 TABLET DAILY WITH SUPPER (Patient taking differently: Take 20 mg by mouth daily with supper.)    No current facility-administered medications for this visit. (Other)      REVIEW OF SYSTEMS:    ALLERGIES No Known Allergies  PAST MEDICAL HISTORY Past Medical History:  Diagnosis Date   AICD (automatic cardioverter/defibrillator) present 2018   MDT CRT-D.  Fatigue-->completely pacer dependent.  Pacer settings adjusted 12/2017 to allow more chronotrophc variance with ADL's//exertion.   Arthritis    Pt is s/p eft reverse total shoulder arthroplasty.   Balanitis    chronic fungal   BPH (benign prostatic hypertrophy) 06/2011   Irritative sx's; pt declined trial of anticholinergic per Urology records   Chronic combined systolic and diastolic heart failure ([place]) 05/31/2012   Nonischemic:  EF 40-45%, LA mod-severe dilated, AFIB.   02/2016 EF 40%, diffuse hypokinesis, grade 2 DD.  Myoc perf imaging showed EF 32% 04/2016.  Pt upgraded to CRT-D 01/04/17.   Chronic renal insufficiency, stage III (moderate) (HCC) 2015   CrCl about 60 ml/min   Complete heart block (HCC)    Has dual chamber pacer.   COVID-19 virus infection 01/05/2021   paxlovid   Depression    DOE (dyspnea on exertion)    NYHA class II/III CHF   Dyspnea 2021   with exertion, bending over   Episodic low back pain 01/22/2013   w/intermittent radiculitis (12/2014 his neurologist referred him to pain mgmt for epidural steroid injection)   Erectile dysfunction 2019   due to zoloft--urol rx'd viagra   GERD (gastroesophageal reflux disease)    H/O tilt table evaluation [id number]   negative   Helicobacter pylori gastritis 01/2016   History of adenomatous polyp of colon 10/12/2011   Dr. [name] (3 right side of colon- tubular adenomas removed)   History of cardiovascular stress test 05/28/2012   no ischemia, EF 37%, imaging results are unchanged and within normal variance   History of chronic prostatitis    History of kidney stones    History of vertigo    + Hx of posterior HA's.  Neuro (Dr. Erling Cruz) eval 2011.  Abnormal  MRI: bicerebral small vessel dz without brainstem involvement.  Congenitally small posterior circulation.   Hyperlipidemia    Hypertension    Lumbar spondylosis    lumbosacral radiculopathy at L4 by EMG testing, right foot drop (neurologist is Dr. Linus Salmons with [facility] in [place])--neurologist referred him to neurosurgery   Migraine    "used to have them all the time; none for years" (01/04/2017)   Myocardial infarction [facility]) ?1970s   not entirely certain of this   Nephrolithiasis 07/2012   Left UVJ 2 mm stone with dilation of renal collecting system and slight hydroureter on right  Neuropathy    NICM (nonischemic cardiomyopathy) ([place])    a. 02/2018 Cath: LM nl, LAD min irregs, LCX no, RCA 20d. :[id number]. Fick CO/CI 4.4/2.0.   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  Since no CAD on cath 05/2016, cards recommends upgrade to CRT-D.   Permanent atrial fibrillation ([place])    DCCV 07/09/13-converted, lasted two days, then back into afib--needs lifetime anticoagulation (Xarelto as of 09/2014)   Prostate cancer screening 09/2017   done by urol annually (normal prostate exam documented + PSA 0.84 as of 10/01/17 urol f/u.  10/2018 urol f/u PSA 0.6, no prostate nodule.   Rectus diastasis    Right ankle sprain 08/2017   w/distal fibula avulsion fx noted on u/s but not plain film-(Dr. Hudnall).   Skin cancer of arm, left    "burned it off" (01/04/2017)   TIA (transient ischemic attack)    L face and L arm weakness. Peri procedural->a. 03/22/2018 following cath. CT head neg. No MRI b/c has pacer. Likely due to embolus to distal branch of RMCA   Type II diabetes mellitus ([place])    Past Surgical History:  Procedure Laterality Date   ABI's Bilateral 05/21/2018   normal   BACK SURGERY     BIV ICD INSERTION CRT-D N/A 01/04/2017   Procedure: BiV ICD ;  Surgeon: Constance Haw, MD;  Location: [facility] [facility];  Service:  Cardiovascular;  Laterality: N/A;   CARDIAC CATHETERIZATION N/A 06/14/2016   Minimal nonobstructive dz, EF 25-35%.  Procedure: Left Heart Cath and Coronary Angiography;  Surgeon: Peter M Martinique, MD;  Location: [facility];  Service: Cardiovascular;  Laterality: N/A;   CARDIOVASCULAR STRESS TEST  2012   2012 nuclear perfusion study: low risk scan; 04/2016 normal myocardial perfusion imaging, EF 32%.   CARDIOVERSION  07/09/2012   Procedure: CARDIOVERSION;  Surgeon: [name], MD;  Location: [facility];  Service: Cardiovascular;  Laterality: N/A;   CATARACT EXTRACTION W/ INTRAOCULAR LENS IMPLANT & ANTERIOR VITRECTOMY, BILATERAL Bilateral    CIRCUMCISION N/A 03/23/2020   Procedure: CIRCUMCISION ADULT;  Surgeon: Remi Haggard, MD;  Location: [facility];  Service: Urology;  Laterality: N/A;   COLONOSCOPY W/ POLYPECTOMY  approx 2006; repeated 09/2011   Polyps on 2013 EGD as well, repeat 12/2014   ESOPHAGOGASTRODUODENOSCOPY  10/18/06   Done due to chronic GERD: Normal, bx showed no barrett's esophagus (Dr. [name])   [place] Left 10/02/2016   Procedure: LEFT RING FINGER WOUND EXPORATION AND FLEXOR TENDON REPAIR AND NERVE REPAIR;  Surgeon: Milly Jakob, MD;  Location: [facility];  Service: Orthopedics;  Laterality: Left;   INSERT / REPLACE / REMOVE PACEMAKER  02/05/2012   dual chamber, sinus node dysfunction, sinus arrest, PAF, Medtronic Revo serial#-[id number] H: last checked 05/2015   LUMBAR LAMINECTOMY Left 1976   L4-5   PACEMAKER REMOVAL  01/04/2017   PERMANENT PACEMAKER INSERTION N/A 02/05/2012   Procedure: PERMANENT PACEMAKER INSERTION;  Surgeon: [name], MD; Generator Medtronic [place] model [place] serial number [id number] H Laterality: N/A;   RETINAL DETACHMENT SURGERY Left ~ [place] Left 2018   Left shoulder reverse [name] in [place]).   RIGHT/LEFT HEART CATH AND CORONARY ANGIOGRAPHY N/A 03/22/2018   EF 30-35%, no CAD.  Procedure:  RIGHT/LEFT HEART CATH AND CORONARY ANGIOGRAPHY;  Surgeon: Jolaine Artist, MD;  Location: [facility];  Service: Cardiovascular;  Laterality: N/A;   TRANSTHORACIC ECHOCARDIOGRAM  08/25/10; 05/2012;  03/23/16;12/2017   mild asymmetric LVH, normal systolic function, normal diastolic fxn, mild-to-mod mitral regurg, mild aortic valve sclerosis and trace AI, mild aortic root dilatation. 2014 f/u showed EF 40-45%, mod LAE, A FIB.  02/2016 EF 40%, diffuse hypokinesis, grade 2 DD. 12/2017 EF 35-40%,diffuse hypokin,grd III DD, mild MR   WISDOM TOOTH EXTRACTION      FAMILY HISTORY Family History  Problem Relation Age of Onset   Heart failure Mother    Stroke Mother    Stroke Father    Heart disease Sister    Heart disease Sister    Cancer Sister        liver   Cancer Brother        lung   Cancer Brother        lung   Heart disease Brother     SOCIAL HISTORY Social History   Tobacco Use   Smoking status: Never   Smokeless tobacco: Never  Vaping Use   Vaping Use: Never used  Substance Use Topics   Alcohol use: Yes    Comment: occ   Drug use: No         OPHTHALMIC EXAM:  Base Eye Exam     Visual Acuity (ETDRS)       Right Left   Dist [place] 20/40 -2 20/60 -1   Dist ph [place] NI 20/50         Tonometry (Tonopen, 3:18 PM)       Right Left   Pressure 14 14         Pupils       Pupils Dark Light Shape React APD   Right PERRL 5 4 Round Brisk None   Left PERRL 5 4 Round Brisk None         Visual Fields       Left Right    Full    Restrictions  Partial outer superior nasal deficiency         Extraocular Movement       Right Left    Full Full         Neuro/Psych     Oriented x3: Yes   Mood/Affect: Normal         Dilation     Left eye: 1.0% Mydriacyl, 2.5% Phenylephrine @ 3:18 PM           Slit Lamp and Fundus Exam     External Exam       Right Left   External Normal Normal         Slit Lamp Exam       Right Left    Lids/Lashes Normal Normal   Conjunctiva/Sclera White and quiet White and quiet   Cornea Clear Clear   Anterior Chamber Deep and quiet Deep,,  Cell, no fibrin, trace pigment, no cell, no flare   Iris Round and reactive Round and reactive   Lens Posterior chamber intraocular lens Posterior chamber intraocular lens in the bag, with Pseudophakodonesis present, lens slightly tilted loose temporally, in upright position still visual axis functional   Anterior Vitreous Normal Pigment and trace cells         Fundus Exam       Right Left   Posterior Vitreous Posterior vitreous detachment Posterior vitreous detachment, Central vitreous floaters, 1+ cell, RBCs inferiorly, no new blood   Disc Normal Normal   C/D Ratio 0.65 0.7   Macula Normal No topographic distortion   Vessels Normal, , no DR Normal, , no  DR   Periphery Normal Good retinopexy temporally, good buckle            IMAGING AND PROCEDURES  Imaging and Procedures for 03/07/21  OCT, Retina - OU - Both Eyes       Right Eye Quality was good. Central Foveal Thickness: 322. Progression has been stable. Findings include normal foveal contour, retinal drusen , no IRF, no SRF.   Left Eye Quality was good. Scan locations included subfoveal. Central Foveal Thickness: 297. Progression has been stable. Findings include epiretinal membrane, abnormal foveal contour, retinal drusen , no IRF, no SRF.   Notes OS with minor epiretinal membrane temporal to the fovea, with no foveal distortion will observe.  Minor to moderate vitreous debris left eye  OD with drusen and drusenoid deposits subfoveal.  No signs of active CNVM     Color Fundus Photography Optos - OU - Both Eyes       Right Eye Progression has been stable. Macula : normal observations.   Left Eye Progression has improved. Disc findings include increased cup to disc ratio, pallor. Macula : drusen, retinal pigment epithelium abnormalities.   Notes Old retinopexy  temporally left eye, retina attached.             ASSESSMENT/PLAN:  Pseudophakodonesis Mild vitreous hemorrhage OS probably associated with phacodonesis and iris chafe yet no active inflammation seen today mild vitreous hemorrhage clearing.  The pseudo phacodonesis in the left eye, is visually not troubling the patient at most of the time during the day.  I thus recommend simple observation and surgical approach to repair of this condition with lens exchange placement of [place] scleral tunnel IOL,  PCIOL should the current lens completely dislocate  Intermediate stage nonexudative age-related macular degeneration of both eyes Stable at present  Chronic iritis, left eye Resolved today  Primary open angle glaucoma of both eyes, mild stage Continue on therapy as per [facility], Nathanial Rancher     ICD-10-CM   1. Pseudophakodonesis, subsequent encounter  T85.29XD OCT, Retina - OU - Both Eyes    Color Fundus Photography Optos - OU - Both Eyes    2. Intermediate stage nonexudative age-related macular degeneration of both eyes  H35.3132     3. Chronic iritis, left eye  H20.12     4. Primary open angle glaucoma of both eyes, mild stage  H40.1131       1.  2.  3.  Ophthalmic Meds Ordered this visit:  No orders of the defined types were placed in this encounter.      Return in about 6 months (around 09/07/2021) for dilate, OS, OCT.  Patient Instructions  Patient understands critical portions of never compressing mashing or rubbing the eye, particularly the left resume further stress the loose attachments of the lens placement in the left eye.  Patient to report promptly should profound vision loss or or out of focus vision develop in the left eye which might indicate the lens has fully dislocated and is now floating around in the eye   Explained the diagnoses, plan, and follow up with the patient and they expressed understanding.  Patient expressed understanding of the  importance of proper follow up care.   Clent Demark Tanise Russman M.D. Diseases & Surgery of the Retina and Vitreous Retina & Diabetic [place] 03/07/21     Abbreviations: M myopia (nearsighted); A astigmatism; H hyperopia (farsighted); P presbyopia; Mrx spectacle prescription;  CTL contact lenses; OD right eye; OS left eye; OU  both eyes  XT exotropia; ET esotropia; PEK punctate epithelial keratitis; PEE punctate epithelial erosions; DES dry eye syndrome; MGD meibomian gland dysfunction; ATs artificial tears; PFAT's preservative free artificial tears; [place] nuclear sclerotic cataract; PSC posterior subcapsular cataract; ERM epi-retinal membrane; PVD posterior vitreous detachment; RD retinal detachment; DM diabetes mellitus; DR diabetic retinopathy; NPDR non-proliferative diabetic retinopathy; PDR proliferative diabetic retinopathy; CSME clinically significant macular edema; DME diabetic macular edema; dbh dot blot hemorrhages; CWS cotton wool spot; POAG primary open angle glaucoma; C/D cup-to-disc ratio; HVF humphrey visual field; GVF goldmann visual field; OCT optical coherence tomography; IOP intraocular pressure; BRVO Branch retinal vein occlusion; CRVO central retinal vein occlusion; CRAO central retinal artery occlusion; BRAO branch retinal artery occlusion; RT retinal tear; SB scleral buckle; PPV pars plana vitrectomy; VH Vitreous hemorrhage; PRP panretinal laser photocoagulation; IVK intravitreal kenalog; VMT vitreomacular traction; MH Macular hole;  NVD neovascularization of the disc; NVE neovascularization elsewhere; AREDS age related eye disease study; ARMD age related macular degeneration; POAG primary open angle glaucoma; EBMD epithelial/anterior basement membrane dystrophy; ACIOL anterior chamber intraocular lens; IOL intraocular lens; PCIOL posterior chamber intraocular lens; Phaco/IOL phacoemulsification with intraocular lens placement; [place] photorefractive keratectomy; LASIK laser assisted in situ  keratomileusis; HTN hypertension; DM diabetes mellitus; COPD chronic obstructive pulmonary disease

## 2021-03-17 ENCOUNTER — Other Ambulatory Visit: Payer: Self-pay | Admitting: Family Medicine

## 2021-03-25 DIAGNOSIS — R3915 Urgency of urination: Secondary | ICD-10-CM | POA: Diagnosis not present

## 2021-03-25 DIAGNOSIS — N401 Enlarged prostate with lower urinary tract symptoms: Secondary | ICD-10-CM | POA: Diagnosis not present

## 2021-03-30 ENCOUNTER — Encounter (INDEPENDENT_AMBULATORY_CARE_PROVIDER_SITE_OTHER): Payer: Medicare Other | Admitting: Ophthalmology

## 2021-04-05 ENCOUNTER — Other Ambulatory Visit: Payer: Self-pay | Admitting: Family Medicine

## 2021-04-05 ENCOUNTER — Telehealth: Payer: Self-pay

## 2021-04-05 NOTE — Telephone Encounter (Signed)
noted 

## 2021-04-05 NOTE — Telephone Encounter (Signed)
Patient refused ED, has an appointment tomorrow with Mackie Pai   Nurse Assessment Nurse: Leilani Merl, RN, Heather Date/Time Eilene Ghazi Time): 04/05/2021 11:44:16 AM Confirm and document reason for call. If symptomatic, describe symptoms. ---Caller states that he started with abd pain a couple of weeks ago, it is on the left side, it comes and goes. Does the patient have any new or worsening symptoms? ---Yes Will a triage be completed? ---Yes Related visit to physician within the last 2 weeks? ---No Does the PT have any chronic conditions? (i.e. diabetes, asthma, this includes High risk factors for pregnancy, etc.) ---Yes List chronic conditions. ---DM, heart probs, defib Is this a behavioral health or substance abuse call? ---No Guidelines Guideline Title Affirmed Question Affirmed Notes Nurse Date/Time (Eastern Time) Abdominal Pain - Male Blood in bowel movements (Exception: Blood on surface of BM with constipation) Standifer, RN, Nira Conn 04/05/2021 11:45:23 AM Disp. Time Eilene Ghazi Time) Disposition Final User 04/05/2021 11:41:37 AM Send to Urgent Queue Silvano Rusk 04/05/2021 11:49:09 AM Go to ED Now Yes Standifer, RN, Heather PLEASE NOTE: All timestamps contained within this report are represented as [place] Standard Time. CONFIDENTIALTY NOTICE: This fax transmission is intended only for the addressee. It contains information that is legally privileged, confidential or otherwise protected from use or disclosure. If you are not the intended recipient, you are strictly prohibited from reviewing, disclosing, copying using or disseminating any of this information or taking any action in reliance on or regarding this information. If you have received this fax in error, please notify us immediately by telephone so that we can arrange for its return to [place]. Phone: [phone], Toll-Free: [phone], Fax: [phone] Page: 2 of 2 Call Id: [id number] [place] Disagree/Comply  Comply Caller Understands Yes PreDisposition Call Doctor Care Advice Given Per Guideline GO TO ED NOW: * You need to be seen in the Emergency Department. CARE ADVICE given per Abdominal Pain, Male (Adult) guideline. Referrals GO TO FACILITY UNDECIDED

## 2021-04-06 ENCOUNTER — Ambulatory Visit (HOSPITAL_BASED_OUTPATIENT_CLINIC_OR_DEPARTMENT_OTHER)
Admission: RE | Admit: 2021-04-06 | Discharge: 2021-04-06 | Disposition: A | Discharge status: Home / Self Care | Payer: Medicare Other | Source: Ambulatory Visit | Attending: Medical | Admitting: Medical

## 2021-04-06 ENCOUNTER — Other Ambulatory Visit: Payer: Self-pay

## 2021-04-06 ENCOUNTER — Ambulatory Visit (INDEPENDENT_AMBULATORY_CARE_PROVIDER_SITE_OTHER): Payer: Medicare Other | Admitting: Medical

## 2021-04-06 VITALS — BP 94/65 | HR 93 | Resp 18 | Ht 71.0 in | Wt 235.0 lb

## 2021-04-06 DIAGNOSIS — H401112 Primary open-angle glaucoma, right eye, moderate stage: Secondary | ICD-10-CM | POA: Diagnosis not present

## 2021-04-06 DIAGNOSIS — R102 Pelvic and perineal pain: Secondary | ICD-10-CM

## 2021-04-06 DIAGNOSIS — I2510 Atherosclerotic heart disease of native coronary artery without angina pectoris: Secondary | ICD-10-CM | POA: Diagnosis not present

## 2021-04-06 DIAGNOSIS — Z961 Presence of intraocular lens: Secondary | ICD-10-CM | POA: Diagnosis not present

## 2021-04-06 DIAGNOSIS — M549 Dorsalgia, unspecified: Secondary | ICD-10-CM

## 2021-04-06 DIAGNOSIS — E119 Type 2 diabetes mellitus without complications: Secondary | ICD-10-CM | POA: Diagnosis not present

## 2021-04-06 DIAGNOSIS — H35372 Puckering of macula, left eye: Secondary | ICD-10-CM | POA: Diagnosis not present

## 2021-04-06 DIAGNOSIS — H401123 Primary open-angle glaucoma, left eye, severe stage: Secondary | ICD-10-CM | POA: Diagnosis not present

## 2021-04-06 DIAGNOSIS — Z9889 Other specified postprocedural states: Secondary | ICD-10-CM | POA: Diagnosis not present

## 2021-04-06 LAB — POC URINALSYSI DIPSTICK (AUTOMATED)
Bilirubin, UA: NEGATIVE
Blood, UA: NEGATIVE
Glucose, UA: POSITIVE — AB
Ketones, UA: NEGATIVE
Leukocytes, UA: NEGATIVE
Nitrite, UA: NEGATIVE
Protein, UA: NEGATIVE
Spec Grav, UA: 1.01 (ref 1.010–1.025)
Urobilinogen, UA: 0.2 E.U./dL
pH, UA: 7 (ref 5.0–8.0)

## 2021-04-06 LAB — HM DIABETES EYE EXAM

## 2021-04-06 MED ORDER — METHYLPREDNISOLONE 4 MG PO TABS: ORAL_TABLET | ORAL | 0 refills | Status: DC

## 2021-04-06 NOTE — Patient Instructions (Signed)
Recent left sided flank region pain.  On exam pain is more in the area of iliac crest and region above.  Pain is more on movement and range of motion of left lower extremity.  Though no pain in the hip area.  Also some milder pain in the left CVA/kidney area.  Urinalysis done today.  Overall nature of pain being present on movement supports probable muscle pain.  However do want to get x-ray of pelvis particularly to look at the left iliac crest bone.  Urinalysis done to see if any blood or indicator of infection.  Considered use of NSAID but you are on Xarelto so cannot use NSAID.  Tylenol I do not think would be adequate for pain.  I think low-dose Medrol is best option at this point.  Will prescribe low taper dose over next 4 days.   Discussed with you prescribing Medrol with caution.  We will continue to check your blood sugars daily and notify us if blood sugars increasing particularly any sugars over 200.  If you do see sugars over 200 then I want you to increase your Lantus by 1 to 2 units in a.m. and increase 1 to 2 units at night is well above your current baseline.  Continue Trulicity same dosing.  Follow-up with PCP in 1 week or can follow-up here if PCP/office does not have availability.  If you severe changing signs symptoms after hours and recommend ED evaluation.

## 2021-04-06 NOTE — Progress Notes (Signed)
Subjective:    Patient ID: Mason [name], male    DOB: Feb 20, 1941, 80 y.o.   MRN: [id number]  HPI  Pt in for some left side flank pain that started about 7-10 days ago. Pain left side/flank area. Pt is present mainly when he move pain is worse. At rest pain is minimal pain. With movement pain can be severe. When has pain can last for 3-5 seconds. But when he gets in and out of beds has pain throughout process of laying down or getting up.   No heavy lifting or project around the house before the pain started.  Pt has not taken tylenol for pain.   Pt is diabetic. On lantus and trulicity.   Pt last a1c was 6.7.  Sugar today before he came in was 124.   Review of Systems  Constitutional:  Negative for chills, fatigue and fever.  Respiratory:  Negative for cough, chest tightness and wheezing.   Cardiovascular:  Negative for chest pain and palpitations.  Gastrointestinal:  Negative for abdominal pain, constipation, diarrhea, nausea and vomiting.  Musculoskeletal:        Faint left CVA region.  Pain is more prominent in the left iliac crest region.      Derm-skin in region of abdomen left flank, iliac crest area and CVA is normal.  No rash or vesicles.    Objective:   Physical Exam   General- No acute distress. Pleasant patient. Neck- Full range of motion, no jvd Lungs- Clear, even and unlabored. Heart- regular rate and rhythm. Neurologic- CNII- XII grossly intact.  Abdomen-soft, nontender, nondistended, positive bowel sounds, no rebound or guarding.  No organomegaly. Back-faint  CVA area tenderness to palpation. Left hip-no pain on palpation of hip and no pain in hip on range of motion. Pelvis-on palpation of the area above left iliac crest and on left iliac crest shows a region of tenderness.  With range of motion of left lower extremity he reports pain is worsening this region.       Assessment & Plan:   Patient Instructions  Recent left sided flank region pain.  On  exam pain is more in the area of iliac crest and region above.  Pain is more on movement and range of motion of left lower extremity.  Though no pain in the hip area.  Also some milder pain in the left CVA/kidney area.  Urinalysis done today.  Overall nature of pain being present on movement supports probable muscle pain.  However do want to get x-ray of pelvis particularly to look at the left iliac crest bone.  Urinalysis done to see if any blood or indicator of infection.  Considered use of NSAID but you are on Xarelto so cannot use NSAID.  Tylenol I do not think would be adequate for pain.  I think low-dose Medrol is best option at this point.  Will prescribe low taper dose over next 4 days.   Discussed with you prescribing Medrol with caution.  We will continue to check your blood sugars daily and notify us if blood sugars increasing particularly any sugars over 200.  If you do see sugars over 200 then I want you to increase your Lantus by 1 to 2 units in a.m. and increase 1 to 2 units at night is well above your current baseline.  Continue Trulicity same dosing.  Follow-up with PCP in 1 week or can follow-up here if PCP/office does not have availability.  If you severe changing signs  symptoms after hours and recommend ED evaluation.   Mackie Pai, PA-C

## 2021-04-14 ENCOUNTER — Ambulatory Visit: Payer: Medicare Other | Admitting: Internal Medicine

## 2021-04-19 ENCOUNTER — Emergency Department (HOSPITAL_BASED_OUTPATIENT_CLINIC_OR_DEPARTMENT_OTHER): Payer: Medicare Other

## 2021-04-19 ENCOUNTER — Other Ambulatory Visit: Payer: Self-pay

## 2021-04-19 ENCOUNTER — Emergency Department (HOSPITAL_BASED_OUTPATIENT_CLINIC_OR_DEPARTMENT_OTHER)
Admission: EM | Admit: 2021-04-19 | Discharge: 2021-04-19 | Disposition: A | Discharge status: Home / Self Care | Payer: Medicare Other | Attending: Emergency Medicine | Admitting: Emergency Medicine

## 2021-04-19 ENCOUNTER — Encounter (HOSPITAL_BASED_OUTPATIENT_CLINIC_OR_DEPARTMENT_OTHER): Payer: Self-pay | Admitting: *Deleted

## 2021-04-19 DIAGNOSIS — Z8616 Personal history of COVID-19: Secondary | ICD-10-CM | POA: Insufficient documentation

## 2021-04-19 DIAGNOSIS — Z7984 Long term (current) use of oral hypoglycemic drugs: Secondary | ICD-10-CM | POA: Insufficient documentation

## 2021-04-19 DIAGNOSIS — E1165 Type 2 diabetes mellitus with hyperglycemia: Secondary | ICD-10-CM | POA: Diagnosis not present

## 2021-04-19 DIAGNOSIS — R109 Unspecified abdominal pain: Secondary | ICD-10-CM | POA: Diagnosis present

## 2021-04-19 DIAGNOSIS — Z85828 Personal history of other malignant neoplasm of skin: Secondary | ICD-10-CM | POA: Diagnosis not present

## 2021-04-19 DIAGNOSIS — M47814 Spondylosis without myelopathy or radiculopathy, thoracic region: Secondary | ICD-10-CM | POA: Diagnosis not present

## 2021-04-19 DIAGNOSIS — E1122 Type 2 diabetes mellitus with diabetic chronic kidney disease: Secondary | ICD-10-CM | POA: Diagnosis not present

## 2021-04-19 DIAGNOSIS — N183 Chronic kidney disease, stage 3 unspecified: Secondary | ICD-10-CM | POA: Diagnosis not present

## 2021-04-19 DIAGNOSIS — X58XXXA Exposure to other specified factors, initial encounter: Secondary | ICD-10-CM | POA: Insufficient documentation

## 2021-04-19 DIAGNOSIS — I5022 Chronic systolic (congestive) heart failure: Secondary | ICD-10-CM | POA: Diagnosis not present

## 2021-04-19 DIAGNOSIS — Z794 Long term (current) use of insulin: Secondary | ICD-10-CM | POA: Diagnosis not present

## 2021-04-19 DIAGNOSIS — M546 Pain in thoracic spine: Secondary | ICD-10-CM | POA: Diagnosis not present

## 2021-04-19 DIAGNOSIS — S39012A Strain of muscle, fascia and tendon of lower back, initial encounter: Secondary | ICD-10-CM | POA: Diagnosis not present

## 2021-04-19 DIAGNOSIS — S29012A Strain of muscle and tendon of back wall of thorax, initial encounter: Secondary | ICD-10-CM | POA: Diagnosis not present

## 2021-04-19 DIAGNOSIS — Z7901 Long term (current) use of anticoagulants: Secondary | ICD-10-CM | POA: Insufficient documentation

## 2021-04-19 DIAGNOSIS — Z95810 Presence of automatic (implantable) cardiac defibrillator: Secondary | ICD-10-CM | POA: Insufficient documentation

## 2021-04-19 DIAGNOSIS — H409 Unspecified glaucoma: Secondary | ICD-10-CM | POA: Insufficient documentation

## 2021-04-19 DIAGNOSIS — I130 Hypertensive heart and chronic kidney disease with heart failure and stage 1 through stage 4 chronic kidney disease, or unspecified chronic kidney disease: Secondary | ICD-10-CM | POA: Diagnosis not present

## 2021-04-19 LAB — URINALYSIS, COMPLETE (UACMP) WITH MICROSCOPIC
Bacteria, UA: NONE SEEN
Bilirubin Urine: NEGATIVE
Glucose, UA: >=500 mg/dL — AB
Hgb urine dipstick: NEGATIVE
Ketones, ur: NEGATIVE mg/dL
Leukocytes,Ua: NEGATIVE
Nitrite: NEGATIVE
Protein, ur: NEGATIVE mg/dL
RBC / HPF: NONE SEEN RBC/hpf (ref 0–5)
Specific Gravity, Urine: 1.01 (ref 1.005–1.030)
WBC, UA: NONE SEEN WBC/hpf (ref 0–5)
pH: 6 (ref 5.0–8.0)

## 2021-04-19 LAB — CBG MONITORING, ED: Glucose-Capillary: 211 mg/dL — ABNORMAL HIGH (ref 70–99)

## 2021-04-19 MED ORDER — METHOCARBAMOL 500 MG PO TABS: 500.0000 mg | ORAL_TABLET | Freq: Two times a day (BID) | ORAL | 0 refills | Status: DC

## 2021-04-19 NOTE — ED Triage Notes (Addendum)
C/o left flank pain which radiates around to left abd  x 2 weeks

## 2021-04-19 NOTE — ED Provider Notes (Signed)
[facility] Provider Note  CSN: [id number] Arrival date & time: 04/19/21 0020  Chief Complaint(s) Flank Pain  HPI Mason [name] is a 80 y.o. male here for 2 weeks of gradual onset left flank pain radiating around to the abdomen.  Pain has been constant since onset but fluctuating in nature.  Described as a aching and stabbing sensation.  Worse with movement and certain positions.  Alleviated by lying still.  Patient denies any falls or trauma.  Reports that he felt the pain 1 day in gradually worsened.  Patient has been seen by his primary care provider and has had negative chest x-ray and rib imaging.  He denies any recent fevers or infections.  No coughing or congestion.  No nausea or vomiting.  No abdominal pain.  No urinary symptoms.  No diarrhea or constipation.  No lower extremity weakness or loss of sensation.  No bladder/bowel incontinence.  The history is provided by the patient.   Past Medical History Past Medical History:  Diagnosis Date  . AICD (automatic cardioverter/defibrillator) present 2018   MDT CRT-D.  Fatigue-->completely pacer dependent.  Pacer settings adjusted 12/2017 to allow more chronotrophc variance with ADL's//exertion.  . Arthritis    Pt is s/p eft reverse total shoulder arthroplasty.  . Balanitis    chronic fungal  . BPH (benign prostatic hypertrophy) 06/2011   Irritative sx's; pt declined trial of anticholinergic per Urology records  . Chronic combined systolic and diastolic heart failure ([place]) 05/31/2012   Nonischemic:  EF 40-45%, LA mod-severe dilated, AFIB.   02/2016 EF 40%, diffuse hypokinesis, grade 2 DD.  Myoc perf imaging showed EF 32% 04/2016.  Pt upgraded to CRT-D 01/04/17.  [name] Chronic renal insufficiency, stage III (moderate) (HCC) 2015   CrCl about 60 ml/min  . Complete heart block (HCC)    Has dual chamber pacer.  [name] COVID-19 virus infection 01/05/2021   paxlovid  . Depression   . DOE (dyspnea on exertion)    NYHA  class II/III CHF  . Dyspnea 2021   with exertion, bending over  . Episodic low back pain 01/22/2013   w/intermittent radiculitis (12/2014 his neurologist referred him to pain mgmt for epidural steroid injection)  . Erectile dysfunction 2019   due to zoloft--urol rx'd viagra  . GERD (gastroesophageal reflux disease)   . H/O tilt table evaluation 11/02/2005   negative  . Helicobacter pylori gastritis 01/2016  . History of adenomatous polyp of colon 10/12/2011   Dr. [name] (3 right side of colon- tubular adenomas removed)  . History of cardiovascular stress test 05/28/2012   no ischemia, EF 37%, imaging results are unchanged and within normal variance  . History of chronic prostatitis   . History of kidney stones   . History of vertigo    + Hx of posterior HA's.  Neuro (Dr. Erling Cruz) eval 2011.  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  . Hyperlipidemia   . Hypertension   . Lumbar spondylosis    lumbosacral radiculopathy at L4 by EMG testing, right foot drop (neurologist is Dr. Linus Salmons with [facility] in [place])--neurologist referred him to neurosurgery  . Migraine    "used to have them all the time; none for years" (01/04/2017)  . Myocardial infarction [facility]) ?1970s   not entirely certain of this  . Nephrolithiasis 07/2012   Left UVJ 2 mm stone with dilation of renal collecting system and slight hydroureter on right  . Neuropathy   .  NICM (nonischemic cardiomyopathy) ([place])    a. 02/2018 Cath: LM nl, LAD min irregs, LCX no, RCA 20d. IOEV03. Fick CO/CI 4.4/2.0.  . 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  Since no CAD on cath 05/2016, cards recommends upgrade to CRT-D.  [name] Permanent atrial fibrillation ([place])    DCCV 07/09/13-converted, lasted two days, then back into afib--needs lifetime anticoagulation (Xarelto as of 09/2014)  . Prostate cancer  screening 09/2017   done by urol annually (normal prostate exam documented + PSA 0.84 as of 10/01/17 urol f/u.  10/2018 urol f/u PSA 0.6, no prostate nodule.  . Rectus diastasis   . Right ankle sprain 08/2017   w/distal fibula avulsion fx noted on u/s but not plain film-(Dr. Hudnall).  . Skin cancer of arm, left    "burned it off" (01/04/2017)  . TIA (transient ischemic attack)    L face and L arm weakness. Peri procedural->a. 03/22/2018 following cath. CT head neg. No MRI b/c has pacer. Likely due to embolus to distal branch of RMCA  . Type II diabetes mellitus [facility])    Patient Active Problem List   Diagnosis Date Noted  . Glaucoma 04/19/2021  . Chronic iritis, left eye 02/14/2021  . Left epiretinal membrane 02/14/2021  . Secondary iritis of left eye 02/14/2021  . Primary open angle glaucoma of both eyes, mild stage 02/14/2021  . History of retinal detachment 09/23/2020  . Phacodonesis 09/23/2020  . Posterior vitreous detachment of right eye 09/23/2020  . Pseudophakodonesis 09/23/2020  . Intermediate stage nonexudative age-related macular degeneration of both eyes 09/23/2020  . Gastroesophageal reflux disease 09/14/2020  . Long term (current) use of anticoagulants 09/03/2018  . 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 2018 07/24/2018  . NICM (nonischemic cardiomyopathy) ([place]) 03/23/2018  . CKD (chronic kidney disease), stage III ([place]) 03/23/2018  . TIA (transient ischemic attack) 03/22/2018  . TIA resulting from procedure 03/22/2018  . Essential hypertension 01/18/2018  . CHF (congestive heart failure) ([place]) 01/04/2017  . Chronic systolic heart failure ([place]) 08/04/2016  . Chronic renal insufficiency, stage II (mild) 12/09/2015  . Permanent atrial fibrillation ([place]) 06/22/2015  . Poorly controlled type 2 diabetes mellitus with circulatory disorder ([place]) 03/25/2015  . Leg fatigue 02/15/2014  . Lumbar back pain with radiculopathy affecting left lower  extremity 02/15/2014  . Other malaise and fatigue 02/05/2014  . Osteoarthritis of right knee 10/14/2013  . HTN (hypertension), benign 05/26/2013  . Hyperlipidemia 04/21/2013  . Atrial fibrillation, permanent 03/08/2013  . Left ventricular dysfunction 05/31/2012  . Complete heart block ([place]) 02/06/2012  . Dyslipidemia (high LDL; low HDL) 02/06/2012   Home Medication(s) Prior to Admission medications   Medication Sig Start Date End Date Taking? Authorizing Provider  methocarbamol (ROBAXIN) 500 MG tablet Take 1 tablet (500 mg total) by mouth 2 (two) times daily. 04/19/21  Yes Nareh Matzke, Grayce Sessions, MD  acetaminophen (TYLENOL) 325 MG tablet Take 650 mg by mouth every 6 (six) hours as needed for moderate pain.    [provider]  BD INSULIN SYRINGE U/F 31G X 5/16" 1 ML MISC USE DAILY AS DIRECTED 01/31/21   McGowen, Adrian Blackwater, MD  COMBIGAN 0.2-0.5 % ophthalmic solution Place 1 drop into both eyes at bedtime.  04/19/16   [provider]  Continuous Blood Gluc Sensor (FREESTYLE LIBRE 14 DAY SENSOR) MISC Check glucose 4 times per day 08/16/20   McGowen, Adrian Blackwater, MD  Continuous Blood Gluc Sensor ([place]) MISC USE  TO CHECK BLOOD SUGARS 4 TIMES DAILY. Before breakfast, before lunch, before supper, and at bedtime 08/04/20   McGowen, Adrian Blackwater, MD  cyclobenzaprine (FLEXERIL) 10 MG tablet 1/2-1 tab po tid prn muscle spasms 12/17/20   McGowen, Adrian Blackwater, MD  diclofenac Sodium (VOLTAREN) 1 % GEL Apply 4 g topically 4 (four) times daily. 12/17/20   McGowen, Adrian Blackwater, MD  Dulaglutide (TRULICITY) 3 PF/7.9KW SOPN Inject 3 mg into the skin once a week. 12/09/20   Philemon Kingdom, MD  empagliflozin (JARDIANCE) 10 MG TABS tablet Take 1 tablet (10 mg total) by mouth daily. **Need appointment before anymore refills given** 03/17/21   McGowen, Adrian Blackwater, MD  ENTRESTO 97-103 MG TAKE 1 TABLET TWO TIMES A DAY 07/20/20   Croitoru, Mihai, MD  eplerenone (INSPRA) 25 MG tablet TAKE 1 TABLET  DAILY 09/01/20   Croitoru, Mihai, MD  gabapentin (NEURONTIN) 300 MG capsule TAKE 2 CAPSULES TWICE A DAY 12/02/20   McGowen, Adrian Blackwater, MD  insulin aspart (NOVOLOG FLEXPEN) 100 UNIT/ML FlexPen INJECT 6-8 UNITS UNDER THE SKIN AT THE TIME OF [employer] [facility] [employer] MEAL 12/09/20   Philemon Kingdom, MD  insulin glargine (LANTUS) 100 UNIT/ML injection Inject 0.36-0.4 mLs (36-40 Units total) into the skin daily. INJECT 54 UNITS UNDER THE SKIN DAILY, WILL BE TITRATING DOSE PERIODICALLY 12/09/20   Philemon Kingdom, MD  Insulin Pen Needle (SURE COMFORT PEN NEEDLES) 31G X 5 MM MISC USE TO INJECT INSULIN UNDER THE SKIN 10/13/20   McGowen, Adrian Blackwater, MD  ketorolac (ACULAR) 0.5 % ophthalmic solution Place 1 drop into the left eye 2 (two) times daily. 02/14/21 02/14/22  Rankin, Clent Demark, MD  latanoprost (XALATAN) 0.005 % ophthalmic solution Place 1 drop into both eyes at bedtime.  04/24/15   [provider]  lidocaine (LIDODERM) 5 % Place 1 patch onto the skin daily. Remove & Discard patch within 12 hours or as directed by MD 12/17/20   McGowen, Adrian Blackwater, MD  meclizine (ANTIVERT) 25 MG tablet TAKE 1 TABLET THREE TIMES A DAY AS NEEDED FOR DIZZINESS 08/31/20   McGowen, Adrian Blackwater, MD  methylPREDNISolone (MEDROL) 4 MG tablet 4 tab po day 1, 3 tab po day 2, 2 tab po day 3 and 1 tab po day 4 04/06/21   Saguier, Percell Miller, PA-C  metoprolol succinate (TOPROL-XL) 50 MG 24 hr tablet TAKE 1 TABLET DAILY. TAKE WITH OR IMMEDIATELY FOLLOWING A MEAL Patient taking differently: Take by mouth daily. Pt taking 1/2 tablet (25 mg) daily 08/23/20   McGowen, Adrian Blackwater, MD  omeprazole (PRILOSEC) 40 MG capsule Take 1 capsule (40 mg total) by mouth daily. 07/13/20   McGowen, Adrian Blackwater, MD  oxybutynin (DITROPAN-XL) 5 MG 24 hr tablet TAKE 1 TABLET AT BEDTIME 06/10/20   McGowen, Adrian Blackwater, MD  rOPINIRole (REQUIP) 1 MG tablet TAKE 1 TABLET DAILY 06/10/20   McGowen, Adrian Blackwater, MD  rosuvastatin (CRESTOR) 20 MG tablet Take 1 tablet (20 mg total) by mouth daily. 09/09/20   Philemon Kingdom, MD  sertraline (ZOLOFT) 100 MG tablet TAKE 1 TABLET DAILY 04/05/21   McGowen, Adrian Blackwater, MD  triamcinolone (KENALOG) 0.1 %  06/01/20   [provider]  XARELTO 20 MG TABS tablet TAKE 1 TABLET DAILY WITH SUPPER Patient taking differently: Take 20 mg by mouth daily with supper. 02/23/20   Croitoru, Dani Gobble, MD  Past Surgical History Past Surgical History:  Procedure Laterality Date  . ABI's Bilateral 05/21/2018   normal  . BACK SURGERY    . BIV ICD INSERTION CRT-D N/A 01/04/2017   Procedure: BiV ICD ;  Surgeon: Constance Haw, MD;  Location: [facility];  Service: Cardiovascular;  Laterality: N/A;  . CARDIAC CATHETERIZATION N/A 06/14/2016   Minimal nonobstructive dz, EF 25-35%.  Procedure: Left Heart Cath and Coronary Angiography;  Surgeon: Peter M Martinique, MD;  Location: [facility];  Service: Cardiovascular;  Laterality: N/A;  . CARDIOVASCULAR STRESS TEST  2012   2012 nuclear perfusion study: low risk scan; 04/2016 normal myocardial perfusion imaging, EF 32%.  [name] CARDIOVERSION  07/09/2012   Procedure: CARDIOVERSION;  Surgeon: [name], MD;  Location: [facility];  Service: Cardiovascular;  Laterality: N/A;  . CATARACT EXTRACTION W/ INTRAOCULAR LENS IMPLANT & ANTERIOR VITRECTOMY, BILATERAL Bilateral   . CIRCUMCISION N/A 03/23/2020   Procedure: CIRCUMCISION ADULT;  Surgeon: Remi Haggard, MD;  Location: [facility];  Service: Urology;  Laterality: N/A;  . COLONOSCOPY W/ POLYPECTOMY  approx 2006; repeated 09/2011   Polyps on 2013 EGD as well, repeat 12/2014  . ESOPHAGOGASTRODUODENOSCOPY  10/18/06   Done due to chronic GERD: Normal, bx showed no barrett's esophagus (Dr. [name])  . FLEXOR TENDON REPAIR Left 10/02/2016   Procedure: LEFT RING FINGER WOUND EXPORATION AND FLEXOR TENDON REPAIR AND NERVE REPAIR;  Surgeon: Milly Jakob, MD;   Location: [facility];  Service: Orthopedics;  Laterality: Left;  . INSERT / REPLACE / REMOVE PACEMAKER  02/05/2012   dual chamber, sinus node dysfunction, sinus arrest, PAF, Medtronic Revo serial#-[id number] H: last checked 05/2015  . LUMBAR LAMINECTOMY Left 1976   L4-5  . PACEMAKER REMOVAL  01/04/2017  . PERMANENT PACEMAKER INSERTION N/A 02/05/2012   Procedure: PERMANENT PACEMAKER INSERTION;  Surgeon: [name], MD; Generator Medtronic Revo model [place] serial number [id number] H Laterality: N/A;  . RETINAL DETACHMENT SURGERY Left ~ 1999  . REVERSE SHOULDER ARTHROPLASTY Left 2018   Left shoulder reverse [name] in [place]  [name] RIGHT/LEFT HEART CATH AND CORONARY ANGIOGRAPHY N/A 03/22/2018   EF 30-35%, no CAD.  Procedure: RIGHT/LEFT HEART CATH AND CORONARY ANGIOGRAPHY;  Surgeon: Jolaine Artist, MD;  Location: [facility];  Service: Cardiovascular;  Laterality: N/A;  . TRANSTHORACIC ECHOCARDIOGRAM  08/25/10; 05/2012; 03/23/16;12/2017   mild asymmetric LVH, normal systolic function, normal diastolic fxn, mild-to-mod mitral regurg, mild aortic valve sclerosis and trace AI, mild aortic root dilatation. 2014 f/u showed EF 40-45%, mod LAE, A FIB.  02/2016 EF 40%, diffuse hypokinesis, grade 2 DD. 12/2017 EF 35-40%,diffuse hypokin,grd III DD, mild MR  . WISDOM TOOTH EXTRACTION     Family History Family History  Problem Relation Age of Onset  . Heart failure Mother   . Stroke Mother   . Stroke Father   . Heart disease Sister   . Heart disease Sister   . Cancer Sister        liver  . Cancer Brother        lung  . Cancer Brother        lung  . Heart disease Brother     Social History Social History   Tobacco Use  . Smoking status: Never  . Smokeless tobacco: Never  Vaping Use  . Vaping Use: Never used  Substance Use Topics  . Alcohol use: Yes    Comment: occ  . Drug use: No   Allergies Patient has no  known allergies.  Review of Systems Review of Systems All other  systems are reviewed and are negative for acute change except as noted in the HPI  Physical Exam Vital Signs  I have reviewed the triage vital signs BP 108/76   Pulse 70   Resp 16   SpO2 95%   Physical Exam Vitals reviewed.  Constitutional:      General: He is not in acute distress.    Appearance: He is well-developed. He is not diaphoretic.  HENT:     Head: Normocephalic and atraumatic.     Nose: Nose normal.  Eyes:     General: No scleral icterus.       Right eye: No discharge.        Left eye: No discharge.     Conjunctiva/sclera: Conjunctivae normal.     Pupils: Pupils are equal, round, and reactive to light.  Cardiovascular:     Rate and Rhythm: Normal rate and regular rhythm.     Heart sounds: No murmur heard.   No friction rub. No gallop.  Pulmonary:     Effort: Pulmonary effort is normal. No respiratory distress.     Breath sounds: Normal breath sounds. No stridor. No rales.  Abdominal:     General: There is no distension.     Palpations: Abdomen is soft.     Tenderness: There is no abdominal tenderness. There is no right CVA tenderness, left CVA tenderness, guarding or rebound.  Musculoskeletal:     Cervical back: Normal range of motion and neck supple. No tenderness.     Thoracic back: Spasms and tenderness present. No bony tenderness.     Lumbar back: No tenderness.       Back:  Skin:    General: Skin is warm and dry.     Findings: No erythema or rash.  Neurological:     Mental Status: He is alert and oriented to person, place, and time.     Comments: Spine Exam: Strength: 5/5 throughout LE bilaterally Sensation: Intact to light touch in proximal and distal LE bilaterally Reflexes: no clonus     ED Results and Treatments Labs (all labs ordered are listed, but only abnormal results are displayed) Labs Reviewed  URINALYSIS, COMPLETE (UACMP) WITH MICROSCOPIC - Abnormal; Notable for the following components:      Result Value   Glucose, UA >=500 (*)     All other components within normal limits  CBG MONITORING, ED - Abnormal; Notable for the following components:   Glucose-Capillary 211 (*)    All other components within normal limits                                                                                                                         EKG  EKG Interpretation  Date/Time:    Ventricular Rate:    PR Interval:    QRS Duration:   QT Interval:    QTC Calculation:   R Axis:  Text Interpretation:         Radiology DG Thoracic Spine 2 View  Result Date: 04/19/2021 CLINICAL DATA:  Back pain. EXAM: THORACIC SPINE 2 VIEWS COMPARISON:  Chest radiographs 04/03/2019 FINDINGS: Thoracic vertebral alignment is normal and no acute fracture is identified, however note that the inferior most aspect of the thoracic spine was incompletely imaged on both the AP and lateral radiographs. There is mild thoracic spondylosis. An ICD, aortic atherosclerosis, and left shoulder arthroplasty are noted. IMPRESSION: Mild thoracic spondylosis without evidence of acute osseous abnormality. Electronically Signed   By: Logan Bores M.D.   On: 04/19/2021 06:37    Pertinent labs & imaging results that were available during my care of the patient were reviewed by me and considered in my medical decision making (see MDM for details).  Medications Ordered in ED Medications - No data to display                                                                                                                                   Procedures Procedures  (including critical care time)  Medical Decision Making / ED Course I have reviewed the nursing notes for this encounter and the patient's prior records (if available in EHR or on provided paperwork).  Mason [name] was evaluated in Emergency Department on 04/19/2021 for the symptoms described in the history of present illness. He was evaluated in the context of the global COVID-19 pandemic, which  necessitated consideration that the patient might be at risk for infection with the SARS-CoV-2 virus that causes COVID-19. Institutional protocols and algorithms that pertain to the evaluation of patients at risk for COVID-19 are in a state of rapid change based on information released by regulatory bodies including the CDC and federal and state organizations. These policies and algorithms were followed during the patient's care in the ED.     Patient is here with midthoracic back pain.  Localized tenderness over a spastic parascapular musculature.  No red flags concerning for spinal stenosis.  No recent falls or trauma.  No abdominal pain or tenderness concerning for intra-abdominal process.  No urinary symptoms.  No overlying rash suspicious for shingles.  No skin infection.  UA without evidence of infection or hematuria.  Unlikely renal stone. Plain film of the thoracic spine without evidence of fracture or dislocation.  No bony lesion.  Likely muscular in nature versus possible radiculopathy.  Recommended continued supportive management.  Prescribed Robaxin.  Recommended PCP follow-up.  Pertinent labs & imaging results that were available during my care of the patient were reviewed by me and considered in my medical decision making:    Final Clinical Impression(s) / ED Diagnoses Final diagnoses:  Strain of thoracic back region   The patient appears reasonably screened and/or stabilized for discharge and I doubt any other medical condition or other [facility] requiring further screening, evaluation, or treatment in the ED at this time  prior to discharge. Safe for discharge with strict return precautions.  Disposition: Discharge  Condition: Good  I have discussed the results, Dx and Tx plan with the patient/family who expressed understanding and agree(s) with the plan. Discharge instructions discussed at length. The patient/family was given strict return precautions who verbalized understanding  of the instructions. No further questions at time of discharge.    ED Discharge Orders          Ordered    methocarbamol (ROBAXIN) 500 MG tablet  2 times daily        04/19/21 1638             Follow Up: Tammi Sou, MD [address] [place]  [id number] [phone]  Call  to schedule an appointment for close follow up     This chart was dictated using voice recognition software.  Despite best efforts to proofread,  errors can occur which can change the documentation meaning.    Fatima Blank, MD 04/19/21 [id number]

## 2021-04-21 ENCOUNTER — Other Ambulatory Visit: Payer: Self-pay

## 2021-04-21 ENCOUNTER — Ambulatory Visit (INDEPENDENT_AMBULATORY_CARE_PROVIDER_SITE_OTHER): Payer: Medicare Other | Admitting: Family Medicine

## 2021-04-21 ENCOUNTER — Encounter: Payer: Self-pay | Admitting: Family Medicine

## 2021-04-21 VITALS — BP 103/67 | HR 74 | Temp 97.4°F | Ht 71.0 in | Wt 234.4 lb

## 2021-04-21 DIAGNOSIS — M791 Myalgia, unspecified site: Secondary | ICD-10-CM | POA: Diagnosis not present

## 2021-04-21 DIAGNOSIS — I2510 Atherosclerotic heart disease of native coronary artery without angina pectoris: Secondary | ICD-10-CM | POA: Diagnosis not present

## 2021-04-21 NOTE — Progress Notes (Signed)
OFFICE VISIT  04/21/2021  CC:  Chief Complaint  Patient presents with   Flank pain    Went to ED 2 days ago, occurring for 2 weeks.    HPI:    Patient is a 80 y.o. Caucasian male who presents for left side pain. HPI:  Onset about 2 wks ago, focal and sharp located over L iliac crest and towards paraspinous muscles on lower L T spine level.  Occ gets brief jabs of pain that are severe.  Occ extends across L side of abd into top of L thigh.  No paresthesias.  The pain impaired his ROM of back.  No rash. Otherwise has felt fine. He was seen for this complaint 04/06/21--had been going on for about a week at that time, musculoskeletal pain dx'd, methylprednisolone taper rx'd by provider and pelvic xray obained and all normal.  Says steroid did not change anything. He then presented to the ED 2 days ago again for this concern.  I reviewed that encounter data today. He apparently had some L mid thoracic level paraspinous muscle tightness/spasm on exam. T-spine plain films: mild thoracic spondylosis without evidence of acute osseous abnormality.  UA normal.  No rash. Robaxin was rx'd.  CURRENTLY: pt says he's taking the robaxin and things do feel better. No new sx's.  Of note, he is NOT taking flexeril listed on current med list.       Past Medical History:  Diagnosis Date   AICD (automatic cardioverter/defibrillator) present 2018   MDT CRT-D.  Fatigue-->completely pacer dependent.  Pacer settings adjusted 12/2017 to allow more chronotrophc variance with ADL's//exertion.   Arthritis    Pt is s/p eft reverse total shoulder arthroplasty.   Balanitis    chronic fungal   BPH (benign prostatic hypertrophy) 06/2011   Irritative sx's; pt declined trial of anticholinergic per Urology records   Chronic combined systolic and diastolic heart failure ([place]) 05/31/2012   Nonischemic:  EF 40-45%, LA mod-severe dilated, AFIB.   02/2016 EF 40%, diffuse hypokinesis, grade 2 DD.  Myoc perf imaging showed EF  32% 04/2016.  Pt upgraded to CRT-D 01/04/17.   Chronic renal insufficiency, stage III (moderate) (HCC) 2015   CrCl about 60 ml/min   Complete heart block (HCC)    Has dual chamber pacer.   COVID-19 virus infection 01/05/2021   paxlovid   Depression    DOE (dyspnea on exertion)    NYHA class II/III CHF   Dyspnea 2021   with exertion, bending over   Episodic low back pain 01/22/2013   w/intermittent radiculitis (12/2014 his neurologist referred him to pain mgmt for epidural steroid injection)   Erectile dysfunction 2019   due to zoloft--urol rx'd viagra   GERD (gastroesophageal reflux disease)    H/O tilt table evaluation 50/09/7046   negative   Helicobacter pylori gastritis 01/2016   History of adenomatous polyp of colon 10/12/2011   Dr. [name] (3 right side of colon- tubular adenomas removed)   History of cardiovascular stress test 05/28/2012   no ischemia, EF 37%, imaging results are unchanged and within normal variance   History of chronic prostatitis    History of kidney stones    History of vertigo    + Hx of posterior HA's.  Neuro (Dr. Erling Cruz) eval 2011.  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   Hyperlipidemia    Hypertension    Lumbar spondylosis    lumbosacral radiculopathy at L4 by EMG testing,  right foot drop (neurologist is Dr. Linus Salmons with [facility] in [place])--neurologist referred him to neurosurgery   Migraine    "used to have them all the time; none for years" (01/04/2017)   Myocardial infarction [facility]) ?1970s   not entirely certain of this   Nephrolithiasis 07/2012   Left UVJ 2 mm stone with dilation of renal collecting system and slight hydroureter on right   Neuropathy    NICM (nonischemic cardiomyopathy) ([place])    a. 02/2018 Cath: LM nl, LAD min irregs, LCX no, RCA 20d. [id number]. Fick CO/CI 4.4/2.0.   Pacemaker 02/05/2012   dual chamber, complete heart block, meddtronic revo,  lasted checked 12/2015.  Since no CAD on cath 05/2016, cards recommends upgrade to CRT-D.   Permanent atrial fibrillation ([place])    DCCV 07/09/13-converted, lasted two days, then back into afib--needs lifetime anticoagulation (Xarelto as of 09/2014)   Prostate cancer screening 09/2017   done by urol annually (normal prostate exam documented + PSA 0.84 as of 10/01/17 urol f/u.  10/2018 urol f/u PSA 0.6, no prostate nodule.   Rectus diastasis    Right ankle sprain 08/2017   w/distal fibula avulsion fx noted on u/s but not plain film-(Dr. Hudnall).   Skin cancer of arm, left    "burned it off" (01/04/2017)   TIA (transient ischemic attack)    L face and L arm weakness. Peri procedural->a. 03/22/2018 following cath. CT head neg. No MRI b/c has pacer. Likely due to embolus to distal branch of RMCA   Type II diabetes mellitus ([place])     Past Surgical History:  Procedure Laterality Date   ABI's Bilateral 05/21/2018   normal   BACK SURGERY     BIV ICD INSERTION CRT-D N/A 01/04/2017   Procedure: BiV ICD ;  Surgeon: Constance Haw, MD;  Location: [place] [facility];  Service: Cardiovascular;  Laterality: N/A;   CARDIAC CATHETERIZATION N/A 06/14/2016   Minimal nonobstructive dz, EF 25-35%.  Procedure: Left Heart Cath and Coronary Angiography;  Surgeon: Peter M Martinique, MD;  Location: [place] [facility];  Service: Cardiovascular;  Laterality: N/A;   CARDIOVASCULAR STRESS TEST  2012   2012 nuclear perfusion study: low risk scan; 04/2016 normal myocardial perfusion imaging, EF 32%.   CARDIOVERSION  07/09/2012   Procedure: CARDIOVERSION;  Surgeon: [name], MD;  Location: [place] [facility];  Service: Cardiovascular;  Laterality: N/A;   CATARACT EXTRACTION W/ INTRAOCULAR LENS IMPLANT & ANTERIOR VITRECTOMY, BILATERAL Bilateral    CIRCUMCISION N/A 03/23/2020   Procedure: CIRCUMCISION ADULT;  Surgeon: Remi Haggard, MD;  Location: [facility];  Service: Urology;  Laterality: N/A;   COLONOSCOPY W/ POLYPECTOMY   approx 2006; repeated 09/2011   Polyps on 2013 EGD as well, repeat 12/2014   ESOPHAGOGASTRODUODENOSCOPY  10/18/06   Done due to chronic GERD: Normal, bx showed no barrett's esophagus (Dr. [name])   [place] Left 10/02/2016   Procedure: LEFT RING FINGER WOUND EXPORATION AND FLEXOR TENDON REPAIR AND NERVE REPAIR;  Surgeon: Milly Jakob, MD;  Location: [place];  Service: Orthopedics;  Laterality: Left;   INSERT / REPLACE / REMOVE PACEMAKER  02/05/2012   dual chamber, sinus node dysfunction, sinus arrest, PAF, Medtronic Revo serial#[id number] H: last checked 05/2015   LUMBAR LAMINECTOMY Left 1976   L4-5   PACEMAKER REMOVAL  01/04/2017   PERMANENT PACEMAKER INSERTION N/A 02/05/2012   Procedure: PERMANENT PACEMAKER INSERTION;  Surgeon: [name], MD; Generator Medtronic Revo model [place] serial number [id number] H Laterality: N/A;   RETINAL  DETACHMENT SURGERY Left ~ [place] Left 2018   Left shoulder reverse TSA ([facility] in [place]).   RIGHT/LEFT HEART CATH AND CORONARY ANGIOGRAPHY N/A 03/22/2018   EF 30-35%, no CAD.  Procedure: RIGHT/LEFT HEART CATH AND CORONARY ANGIOGRAPHY;  Surgeon: Jolaine Artist, MD;  Location: [place] [facility];  Service: Cardiovascular;  Laterality: N/A;   TRANSTHORACIC ECHOCARDIOGRAM  08/25/10; 05/2012; 03/23/16;12/2017   mild asymmetric LVH, normal systolic function, normal diastolic fxn, mild-to-mod mitral regurg, mild aortic valve sclerosis and trace AI, mild aortic root dilatation. 2014 f/u showed EF 40-45%, mod LAE, A FIB.  02/2016 EF 40%, diffuse hypokinesis, grade 2 DD. 12/2017 EF 35-40%,diffuse hypokin,grd III DD, mild MR   WISDOM TOOTH EXTRACTION      Outpatient Medications Prior to Visit  Medication Sig Dispense Refill   acetaminophen (TYLENOL) 325 MG tablet Take 650 mg by mouth every 6 (six) hours as needed for moderate pain.     BD INSULIN SYRINGE U/F 31G X 5/16" 1 ML MISC USE DAILY AS DIRECTED 100 each 3   COMBIGAN  0.2-0.5 % ophthalmic solution Place 1 drop into both eyes at bedtime.      Continuous Blood Gluc Sensor (FREESTYLE LIBRE 14 DAY SENSOR) MISC Check glucose 4 times per day 6 each 3   Continuous Blood Gluc Sensor (FREESTYLE LIBRE SENSOR SYSTEM) MISC USE TO CHECK BLOOD SUGARS 4 TIMES DAILY. Before breakfast, before lunch, before supper, and at bedtime 1 each 3   diclofenac Sodium (VOLTAREN) 1 % GEL Apply 4 g topically 4 (four) times daily. 100 g 1   Dulaglutide (TRULICITY) 3 IR/4.8NI SOPN Inject 3 mg into the skin once a week. 6 mL 3   empagliflozin (JARDIANCE) 10 MG TABS tablet Take 1 tablet (10 mg total) by mouth daily. **Need appointment before anymore refills given** 90 tablet 0   ENTRESTO 97-103 MG TAKE 1 TABLET TWO TIMES A DAY 180 tablet 3   eplerenone (INSPRA) 25 MG tablet TAKE 1 TABLET DAILY 90 tablet 3   gabapentin (NEURONTIN) 300 MG capsule TAKE 2 CAPSULES TWICE A DAY 360 capsule 3   insulin aspart (NOVOLOG FLEXPEN) 100 UNIT/ML FlexPen INJECT 6-8 UNITS UNDER THE SKIN AT THE TIME OF EACH MEAL 15 mL 4   insulin glargine (LANTUS) 100 UNIT/ML injection Inject 0.36-0.4 mLs (36-40 Units total) into the skin daily. INJECT 54 UNITS UNDER THE SKIN DAILY, WILL BE TITRATING DOSE PERIODICALLY 30 mL 3   Insulin Pen Needle (SURE COMFORT PEN NEEDLES) 31G X 5 MM MISC USE TO INJECT INSULIN UNDER THE SKIN 100 each 11   ketorolac (ACULAR) 0.5 % ophthalmic solution Place 1 drop into the left eye 2 (two) times daily. 5 mL 1   latanoprost (XALATAN) 0.005 % ophthalmic solution Place 1 drop into both eyes at bedtime.   12   lidocaine (LIDODERM) 5 % Place 1 patch onto the skin daily. Remove & Discard patch within 12 hours or as directed by MD 30 patch 0   meclizine (ANTIVERT) 25 MG tablet TAKE 1 TABLET THREE TIMES A DAY AS NEEDED FOR DIZZINESS 270 tablet 3   methocarbamol (ROBAXIN) 500 MG tablet Take 1 tablet (500 mg total) by mouth 2 (two) times daily. 20 tablet 0   metoprolol succinate (TOPROL-XL) 50 MG 24 hr  tablet TAKE 1 TABLET DAILY. TAKE WITH OR IMMEDIATELY FOLLOWING A MEAL (Patient taking differently: Take by mouth daily. Pt taking 1/2 tablet (25 mg) daily) 90 tablet 0  omeprazole (PRILOSEC) 40 MG capsule Take 1 capsule (40 mg total) by mouth daily. 90 capsule 1   oxybutynin (DITROPAN-XL) 5 MG 24 hr tablet TAKE 1 TABLET AT BEDTIME 90 tablet 3   rOPINIRole (REQUIP) 1 MG tablet TAKE 1 TABLET DAILY 90 tablet 3   rosuvastatin (CRESTOR) 20 MG tablet Take 1 tablet (20 mg total) by mouth daily. 90 tablet 3   sertraline (ZOLOFT) 100 MG tablet TAKE 1 TABLET DAILY 90 tablet 0   XARELTO 20 MG TABS tablet TAKE 1 TABLET DAILY WITH SUPPER (Patient taking differently: Take 20 mg by mouth daily with supper.) 90 tablet 3   cyclobenzaprine (FLEXERIL) 10 MG tablet 1/2-1 tab po tid prn muscle spasms 30 tablet 0   triamcinolone (KENALOG) 0.1 %      methylPREDNISolone (MEDROL) 4 MG tablet 4 tab po day 1, 3 tab po day 2, 2 tab po day 3 and 1 tab po day 4 16 tablet 0   No facility-administered medications prior to visit.    No Known Allergies  ROS As per HPI  PE: Vitals with BMI 04/21/2021 04/19/2021 04/19/2021  Height 5\' 11"  - -  Weight 234 lbs 6 oz - -  BMI 89.38 - -  Systolic 101 751 96  Diastolic 67 76 69  Pulse 74 70 70    Gen: Alert, well appearing.  Patient is oriented to person, place, time, and situation. AFFECT: pleasant, lucid thought and speech. Back: normal ROM with just a bit of stiffness. No tenderness in muscles or over spine.  No tenderness of L hip or abdomen. CV: RRR, no m/r/g.   LUNGS: CTA bilat, nonlabored resps, good aeration in all lung fields.   LABS:    Chemistry      Component Value Date/Time   NA 136 10/04/2020 1412   NA 139 12/28/2016 1405   K 4.6 10/04/2020 1412   CL 103 10/04/2020 1412   CO2 23 10/04/2020 1412   BUN 23 10/04/2020 1412   BUN 25 12/28/2016 1405   CREATININE 1.34 10/04/2020 1412   CREATININE 1.21 (H) 06/06/2016 1312      Component Value Date/Time    CALCIUM 9.3 10/04/2020 1412   ALKPHOS 84 10/04/2020 1412   AST 15 10/04/2020 1412   ALT 12 10/04/2020 1412   BILITOT 0.5 10/04/2020 1412     Lab Results  Component Value Date   WBC 5.8 10/04/2020   HGB 16.9 10/04/2020   HCT 48.8 10/04/2020   MCV 89.3 10/04/2020   PLT 146.0 (L) 10/04/2020   Lab Results  Component Value Date   HGBA1C 6.7 (A) 12/09/2020    IMPRESSION AND PLAN:  Musculoskeletel lower thoracic/upper lumbar level on left. Improving last couple days since getting on low dose robaxin and he'll continue this prn.  An After Visit Summary was printed and given to the patient.  Spent 22 min with pt today reviewing HPI, reviewing relevant past history, doing exam, reviewing and discussing lab and imaging data, and formulating plans.  FOLLOW UP: Return if symptoms worsen or fail to improve.  Signed:  Crissie Sickles, MD           04/21/2021

## 2021-04-26 LAB — CUP PACEART REMOTE DEVICE CHECK
Battery Remaining Longevity: 28 mo
Battery Voltage: 2.95 V
Brady Statistic AP VP Percent: 0 %
Brady Statistic AP VS Percent: 0 %
Brady Statistic AS VP Percent: 0 %
Brady Statistic AS VS Percent: 0 %
Brady Statistic RA Percent Paced: 0 %
Brady Statistic RV Percent Paced: 99.46 %
Date Time Interrogation Session: 20221004012503
HighPow Impedance: 70 Ohm
Implantable Lead Implant Date: 20130715
Implantable Lead Implant Date: 20180614
Implantable Lead Implant Date: 20180614
Implantable Lead Location: 753858
Implantable Lead Location: 753859
Implantable Lead Location: 753860
Implantable Lead Model: 4598
Implantable Pulse Generator Implant Date: 20180614
Lead Channel Impedance Value: 193.707
Lead Channel Impedance Value: 193.707
Lead Channel Impedance Value: 218.104
Lead Channel Impedance Value: 237.686
Lead Channel Impedance Value: 237.686
Lead Channel Impedance Value: 342 Ohm
Lead Channel Impedance Value: 361 Ohm
Lead Channel Impedance Value: 418 Ohm
Lead Channel Impedance Value: 418 Ohm
Lead Channel Impedance Value: 456 Ohm
Lead Channel Impedance Value: 532 Ohm
Lead Channel Impedance Value: 532 Ohm
Lead Channel Impedance Value: 551 Ohm
Lead Channel Impedance Value: 665 Ohm
Lead Channel Impedance Value: 665 Ohm
Lead Channel Impedance Value: 817 Ohm
Lead Channel Impedance Value: 836 Ohm
Lead Channel Impedance Value: 874 Ohm
Lead Channel Pacing Threshold Amplitude: 0.5 V
Lead Channel Pacing Threshold Amplitude: 0.5 V
Lead Channel Pacing Threshold Pulse Width: 0.4 ms
Lead Channel Pacing Threshold Pulse Width: 1 ms
Lead Channel Sensing Intrinsic Amplitude: 0.625 mV
Lead Channel Sensing Intrinsic Amplitude: 20.5 mV
Lead Channel Sensing Intrinsic Amplitude: 20.5 mV
Lead Channel Setting Pacing Amplitude: 1 V
Lead Channel Setting Pacing Amplitude: 2 V
Lead Channel Setting Pacing Pulse Width: 0.4 ms
Lead Channel Setting Pacing Pulse Width: 1 ms
Lead Channel Setting Sensing Sensitivity: 0.3 mV

## 2021-04-27 ENCOUNTER — Ambulatory Visit (INDEPENDENT_AMBULATORY_CARE_PROVIDER_SITE_OTHER): Payer: Medicare Other

## 2021-04-27 DIAGNOSIS — I428 Other cardiomyopathies: Secondary | ICD-10-CM

## 2021-05-05 ENCOUNTER — Encounter: Payer: Self-pay | Admitting: Internal Medicine

## 2021-05-05 ENCOUNTER — Other Ambulatory Visit: Payer: Self-pay

## 2021-05-05 ENCOUNTER — Ambulatory Visit (INDEPENDENT_AMBULATORY_CARE_PROVIDER_SITE_OTHER): Payer: Medicare Other | Admitting: Internal Medicine

## 2021-05-05 VITALS — BP 104/80 | HR 84 | Ht 71.0 in | Wt 236.6 lb

## 2021-05-05 DIAGNOSIS — E1159 Type 2 diabetes mellitus with other circulatory complications: Secondary | ICD-10-CM | POA: Diagnosis not present

## 2021-05-05 DIAGNOSIS — E1165 Type 2 diabetes mellitus with hyperglycemia: Secondary | ICD-10-CM | POA: Diagnosis not present

## 2021-05-05 DIAGNOSIS — I2510 Atherosclerotic heart disease of native coronary artery without angina pectoris: Secondary | ICD-10-CM | POA: Diagnosis not present

## 2021-05-05 DIAGNOSIS — E785 Hyperlipidemia, unspecified: Secondary | ICD-10-CM | POA: Diagnosis not present

## 2021-05-05 NOTE — Progress Notes (Signed)
Remote ICD transmission.   

## 2021-05-05 NOTE — Patient Instructions (Addendum)
Please continue: - Jardiance 10 mg before breakfast - Trulicity 3 mg weekly - Lantus 42 units at bedtime  Please change: - NovoLog  14-18 units before brunch 8-10 units before dinner If you have a snack at night, you may need at least 5 units before the snack  Please return in 4 months.

## 2021-05-05 NOTE — Progress Notes (Signed)
Patient ID: Mason [name], male   DOB: [birth year], 80 y.o.   MRN: [id number]   This visit occurred during the SARS-CoV-2 public health emergency.  Safety protocols were in place, including screening questions prior to the visit, additional usage of staff PPE, and extensive cleaning of exam room while observing appropriate contact time as indicated for disinfecting solutions.   HPI: Mason [name] is a 80 y.o.-year-old male, initially referred by his PCP, Dr. Anitra Lauth, returning for follow-up for DM2, dx in ~2011, insulin-dependent since 2019, uncontrolled, with long-term complications (CAD with ?h/o AMI, nonischemic CMP, CHF, Afib, cerebrovascular disease with h/o TIA, CKD stage III, peripheral neuropathy, ED).  Last visit 5 months ago. He is here with his wife.  Interim history: No increased urination, blurry vision, nausea, chest pain.  Reviewed HbA1c levels:  04/11/2021: HbA1c 7.1% Lab Results  Component Value Date   HGBA1C 6.7 (A) 12/09/2020   HGBA1C 7.6 (A) 09/09/2020   HGBA1C 9.7 (H) 06/22/2020   HGBA1C 9.0 (H) 03/23/2020   HGBA1C 9.2 (H) 03/16/2020   HGBA1C 9.9 (H) 11/20/2019   HGBA1C 9.7 (H) 05/27/2019   HGBA1C 9.0 (H) 02/26/2019   HGBA1C 10.1 (H) 11/29/2018   HGBA1C 9.0 (A) 08/29/2018   HGBA1C 9.0 08/29/2018   HGBA1C 9.0 (A) 08/29/2018   HGBA1C 9.0 (A) 08/29/2018   HGBA1C 7.8 (A) 05/14/2018   HGBA1C 8.1 (H) 02/06/2018   HGBA1C 8.0 (H) 10/26/2017   HGBA1C 8.2 (H) 07/27/2017   HGBA1C 7.0 03/01/2017   HGBA1C 7.1 11/16/2016   HGBA1C 7.8 (H) 07/03/2016   HGBA1C 6.8 03/14/2016   HGBA1C 7.0 (H) 12/09/2015   HGBA1C 7.4 (H) 09/20/2015   HGBA1C 7.2 (H) 03/25/2015   HGBA1C 7.5 (H) 10/15/2014   HGBA1C 7.1 (H) 06/15/2014   HGBA1C 6.9 (H) 02/12/2014   HGBA1C 8.6 (H) 10/14/2013   HGBA1C 7.5 (H) 07/15/2013   HGBA1C 6.7 (H) 01/03/2013   We changed to the following regimen: - Jardiance 10 mg before breakfast - Trulicity 1.5 >> 3  mg weekly - NovoLog pens : 12 (-10-10) >> 6-8  before  B-L-D >> however, he is actually taking: 18 units before Brunch and 8 units before D at 6 pm, banana split at 8 pm - Lantus vial 44 >> 40 >> 36-40 >> actually taking 42 units at bedtime She was on metformin in the past.  Pt checks his sugars more than 4 times a day with his freestyle libre CGM:   Previously:   Previously:   Lowest sugar was: 45 >> 70 >> 60 >> 50s; he has hypoglycemia awareness at 70.  Highest sugar was 400 >> 200s >> 200 >> 200s.  Glucometer: Freestyle  Pt's meals are: - Breakfast: cereals, eggs, grits, toast - Lunch: may skip or sandwich - Dinner: meat + veggies + starch - Snacks: apple/cheese >> bananasplits at night Patient saw nutrition a long time ago.  -+ Stage III CKD, last BUN/creatinine:  04/11/2021: 31/1.86, GFR 37, glucose 133 Lab Results  Component Value Date   BUN 23 10/04/2020   BUN 28 (H) 06/22/2020   CREATININE 1.34 10/04/2020   CREATININE 1.57 (H) 06/22/2020  On Entresto.  -+ HL; last set of lipids: 04/11/2021: 141/214/45/53 Lab Results  Component Value Date   CHOL 168 06/22/2020   HDL 38.80 (L) 06/22/2020   LDLCALC 85 03/16/2020   LDLDIRECT 103.0 06/22/2020   TRIG 215.0 (H) 06/22/2020   CHOLHDL 4 06/22/2020  Previously on Zocor 20, but now Crestor 20 mg daily.  -  last eye exam was in 09/2020 (Dr. Zadie Rhine): No DR, + glaucoma.  -He has numbness and tingling in his feet.  On Neurontin 300 mg twice a day.  No known FH of DM.  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 Most recent vitamin B12 level: 04/11/2021: 429  Drinks beer 2-3x a day, but not quite every day.  ROS: + see HPI Neurological: no tremors/+ numbness/+ tingling/no dizziness  I reviewed pt's medications, allergies, PMH, social hx, family hx, and changes were documented in the history of present illness. Otherwise, unchanged from my initial visit note.  Past Medical History:   Diagnosis Date   AICD (automatic cardioverter/defibrillator) present 2018   MDT CRT-D.  Fatigue-->completely pacer dependent.  Pacer settings adjusted 12/2017 to allow more chronotrophc variance with ADL's//exertion.   Arthritis    Pt is s/p eft reverse total shoulder arthroplasty.   Balanitis    chronic fungal   BPH (benign prostatic hypertrophy) 06/2011   Irritative sx's; pt declined trial of anticholinergic per Urology records   Chronic combined systolic and diastolic heart failure ([place]) 05/31/2012   Nonischemic:  EF 40-45%, LA mod-severe dilated, AFIB.   02/2016 EF 40%, diffuse hypokinesis, grade 2 DD.  Myoc perf imaging showed EF 32% 04/2016.  Pt upgraded to CRT-D 01/04/17.   Chronic renal insufficiency, stage III (moderate) (HCC) 2015   CrCl about 60 ml/min   Complete heart block (HCC)    Has dual chamber pacer.   COVID-19 virus infection 01/05/2021   paxlovid   Depression    DOE (dyspnea on exertion)    NYHA class II/III CHF   Dyspnea 2021   with exertion, bending over   Episodic low back pain 01/22/2013   w/intermittent radiculitis (12/2014 his neurologist referred him to pain mgmt for epidural steroid injection)   Erectile dysfunction 2019   due to zoloft--urol rx'd viagra   GERD (gastroesophageal reflux disease)    H/O tilt table evaluation 36/46/8032   negative   Helicobacter pylori gastritis 01/2016   History of adenomatous polyp of colon 10/12/2011   Dr. [name] (3 right side of colon- tubular adenomas removed)   History of cardiovascular stress test 05/28/2012   no ischemia, EF 37%, imaging results are unchanged and within normal variance   History of chronic prostatitis    History of kidney stones    History of vertigo    + Hx of posterior HA's.  Neuro (Dr. Erling Cruz) eval 2011.  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   Hyperlipidemia    Hypertension    Lumbar spondylosis    lumbosacral  radiculopathy at L4 by EMG testing, right foot drop (neurologist is Dr. Linus Salmons with [facility] in [place])--neurologist referred him to neurosurgery   Migraine    "used to have them all the time; none for years" (01/04/2017)   Myocardial infarction [facility]) ?1970s   not entirely certain of this   Nephrolithiasis 07/2012   Left UVJ 2 mm stone with dilation of renal collecting system and slight hydroureter on right   Neuropathy    NICM (nonischemic cardiomyopathy) ([place])    a. 02/2018 Cath: LM nl, LAD min irregs, LCX no, RCA 20d. [id number]. Fick CO/CI 4.4/2.0.   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  Since no CAD on cath 05/2016, cards recommends upgrade to CRT-D.   Permanent  atrial fibrillation ([place])    DCCV 07/09/13-converted, lasted two days, then back into afib--needs lifetime anticoagulation (Xarelto as of 09/2014)   Prostate cancer screening 09/2017   done by urol annually (normal prostate exam documented + PSA 0.84 as of 10/01/17 urol f/u.  10/2018 urol f/u PSA 0.6, no prostate nodule.   Rectus diastasis    Right ankle sprain 08/2017   w/distal fibula avulsion fx noted on u/s but not plain film-(Dr. Hudnall).   Skin cancer of arm, left    "burned it off" (01/04/2017)   TIA (transient ischemic attack)    L face and L arm weakness. Peri procedural->a. 03/22/2018 following cath. CT head neg. No MRI b/c has pacer. Likely due to embolus to distal branch of RMCA   Type II diabetes mellitus ([place])    Past Surgical History:  Procedure Laterality Date   ABI's Bilateral 05/21/2018   normal   BACK SURGERY     BIV ICD INSERTION CRT-D N/A 01/04/2017   Procedure: BiV ICD ;  Surgeon: Constance Haw, MD;  Location: [place];  Service: Cardiovascular;  Laterality: N/A;   CARDIAC CATHETERIZATION N/A 06/14/2016   Minimal nonobstructive dz, EF 25-35%.  Procedure: Left Heart Cath and Coronary Angiography;  Surgeon: Peter M  Martinique, MD;  Location: [place];  Service: Cardiovascular;  Laterality: N/A;   CARDIOVASCULAR STRESS TEST  2012   2012 nuclear perfusion study: low risk scan; 04/2016 normal myocardial perfusion imaging, EF 32%.   CARDIOVERSION  07/09/2012   Procedure: CARDIOVERSION;  Surgeon: [name], MD;  Location: [facility];  Service: Cardiovascular;  Laterality: N/A;   CATARACT EXTRACTION W/ INTRAOCULAR LENS IMPLANT & ANTERIOR VITRECTOMY, BILATERAL Bilateral    CIRCUMCISION N/A 03/23/2020   Procedure: CIRCUMCISION ADULT;  Surgeon: Remi Haggard, MD;  Location: [facility];  Service: Urology;  Laterality: N/A;   COLONOSCOPY W/ POLYPECTOMY  approx 2006; repeated 09/2011   Polyps on 2013 EGD as well, repeat 12/2014   ESOPHAGOGASTRODUODENOSCOPY  10/18/06   Done due to chronic GERD: Normal, bx showed no barrett's esophagus (Dr. [name])   [place] Left 10/02/2016   Procedure: LEFT RING FINGER WOUND EXPORATION AND FLEXOR TENDON REPAIR AND NERVE REPAIR;  Surgeon: Milly Jakob, MD;  Location: [facility];  Service: Orthopedics;  Laterality: Left;   INSERT / REPLACE / REMOVE PACEMAKER  02/05/2012   dual chamber, sinus node dysfunction, sinus arrest, PAF, Medtronic Revo serial#-[id number] H: last checked 05/2015   LUMBAR LAMINECTOMY Left 1976   L4-5   PACEMAKER REMOVAL  01/04/2017   PERMANENT PACEMAKER INSERTION N/A 02/05/2012   Procedure: PERMANENT PACEMAKER INSERTION;  Surgeon: [name], MD; Generator Medtronic [employer] [employer] model [place] serial number [id number] H Laterality: N/A;   RETINAL DETACHMENT SURGERY Left ~ [place] Left 2018   Left shoulder reverse [name] in [place]).   RIGHT/LEFT HEART CATH AND CORONARY ANGIOGRAPHY N/A 03/22/2018   EF 30-35%, no CAD.  Procedure: RIGHT/LEFT HEART CATH AND CORONARY ANGIOGRAPHY;  Surgeon: Jolaine Artist, MD;  Location: [facility];  Service: Cardiovascular;  Laterality: N/A;   TRANSTHORACIC ECHOCARDIOGRAM   08/25/10; 05/2012; 03/23/16;12/2017   mild asymmetric LVH, normal systolic function, normal diastolic fxn, mild-to-mod mitral regurg, mild aortic valve sclerosis and trace AI, mild aortic root dilatation. 2014 f/u showed EF 40-45%, mod LAE, A FIB.  02/2016 EF 40%, diffuse hypokinesis, grade 2 DD. 12/2017 EF 35-40%,diffuse hypokin,grd III DD, mild MR   WISDOM TOOTH EXTRACTION  Social History   Socioeconomic History   Marital status: Married    Spouse name: Not on file   Number of children: Not on file   Years of education: Not on file   Highest education level: Not on file  Occupational History   Not on file  Tobacco Use   Smoking status: Never   Smokeless tobacco: Never  Vaping Use   Vaping Use: Never used  Substance and Sexual Activity   Alcohol use: Yes    Comment: occ   Drug use: No   Sexual activity: Not on file  Other Topics Concern   Not on file  Social History Narrative   Not on file   Social Determinants of Health   Financial Resource Strain: Low Risk    Difficulty of Paying Living Expenses: Not hard at all  Food Insecurity: No Food Insecurity   Worried About Estate manager/land agent of Food in the Last Year: Never true   [place] in the Last Year: Never true  Transportation Needs: No Transportation Needs   Lack of Transportation (Medical): No   Lack of Transportation (Non-Medical): No  Physical Activity: Sufficiently Active   Days of Exercise per Week: 7 days   Minutes of Exercise per Session: 30 min  Stress: No Stress Concern Present   Feeling of Stress : Not at all  Social Connections: Moderately Integrated   Frequency of Communication with Friends and Family: More than three times a week   Frequency of Social Gatherings with Friends and Family: More than three times a week   Attends Religious Services: Never   Marine scientist or Organizations: Yes   Attends Music therapist: More than 4 times per year   Marital Status: Married  Arboriculturist Violence: Not At Risk   Fear of Current or Ex-Partner: No   Emotionally Abused: No   Physically Abused: No   Sexually Abused: No   Current Outpatient Medications on File Prior to Visit  Medication Sig Dispense Refill   acetaminophen (TYLENOL) 325 MG tablet Take 650 mg by mouth every 6 (six) hours as needed for moderate pain.     BD INSULIN SYRINGE U/F 31G X 5/16" 1 ML MISC USE DAILY AS DIRECTED 100 each 3   COMBIGAN 0.2-0.5 % ophthalmic solution Place 1 drop into both eyes at bedtime.      Continuous Blood Gluc Sensor (FREESTYLE LIBRE 14 DAY SENSOR) MISC Check glucose 4 times per day 6 each 3   Continuous Blood Gluc Sensor (FREESTYLE LIBRE SENSOR SYSTEM) MISC USE TO CHECK BLOOD SUGARS 4 TIMES DAILY. Before breakfast, before lunch, before supper, and at bedtime 1 each 3   diclofenac Sodium (VOLTAREN) 1 % GEL Apply 4 g topically 4 (four) times daily. 100 g 1   Dulaglutide (TRULICITY) 3 WC/5.8NI SOPN Inject 3 mg into the skin once a week. 6 mL 3   empagliflozin (JARDIANCE) 10 MG TABS tablet Take 1 tablet (10 mg total) by mouth daily. **Need appointment before anymore refills given** 90 tablet 0   ENTRESTO 97-103 MG TAKE 1 TABLET TWO TIMES A DAY 180 tablet 3   eplerenone (INSPRA) 25 MG tablet TAKE 1 TABLET DAILY 90 tablet 3   gabapentin (NEURONTIN) 300 MG capsule TAKE 2 CAPSULES TWICE A DAY 360 capsule 3   insulin aspart (NOVOLOG FLEXPEN) 100 UNIT/ML FlexPen INJECT 6-8 UNITS UNDER THE SKIN AT THE TIME OF EACH MEAL 15 mL 4   insulin glargine (LANTUS) 100 UNIT/ML  injection Inject 0.36-0.4 mLs (36-40 Units total) into the skin daily. INJECT 54 UNITS UNDER THE SKIN DAILY, WILL BE TITRATING DOSE PERIODICALLY 30 mL 3   Insulin Pen Needle (SURE COMFORT PEN NEEDLES) 31G X 5 MM MISC USE TO INJECT INSULIN UNDER THE SKIN 100 each 11   ketorolac (ACULAR) 0.5 % ophthalmic solution Place 1 drop into the left eye 2 (two) times daily. 5 mL 1   latanoprost (XALATAN) 0.005 % ophthalmic solution Place 1 drop  into both eyes at bedtime.   12   lidocaine (LIDODERM) 5 % Place 1 patch onto the skin daily. Remove & Discard patch within 12 hours or as directed by MD 30 patch 0   meclizine (ANTIVERT) 25 MG tablet TAKE 1 TABLET THREE TIMES A DAY AS NEEDED FOR DIZZINESS 270 tablet 3   methocarbamol (ROBAXIN) 500 MG tablet Take 1 tablet (500 mg total) by mouth 2 (two) times daily. 20 tablet 0   metoprolol succinate (TOPROL-XL) 50 MG 24 hr tablet TAKE 1 TABLET DAILY. TAKE WITH OR IMMEDIATELY FOLLOWING A MEAL (Patient taking differently: Take by mouth daily. Pt taking 1/2 tablet (25 mg) daily) 90 tablet 0   omeprazole (PRILOSEC) 40 MG capsule Take 1 capsule (40 mg total) by mouth daily. 90 capsule 1   oxybutynin (DITROPAN-XL) 5 MG 24 hr tablet TAKE 1 TABLET AT BEDTIME 90 tablet 3   rOPINIRole (REQUIP) 1 MG tablet TAKE 1 TABLET DAILY 90 tablet 3   rosuvastatin (CRESTOR) 20 MG tablet Take 1 tablet (20 mg total) by mouth daily. 90 tablet 3   sertraline (ZOLOFT) 100 MG tablet TAKE 1 TABLET DAILY 90 tablet 0   XARELTO 20 MG TABS tablet TAKE 1 TABLET DAILY WITH SUPPER (Patient taking differently: Take 20 mg by mouth daily with supper.) 90 tablet 3   No current facility-administered medications on file prior to visit.   No Known Allergies Family History  Problem Relation Age of Onset   Heart failure Mother    Stroke Mother    Stroke Father    Heart disease Sister    Heart disease Sister    Cancer Sister        liver   Cancer Brother        lung   Cancer Brother        lung   Heart disease Brother    PE: BP 104/80 (BP Location: Right Arm, Patient Position: Sitting, Cuff Size: Normal)   Pulse 84   Ht 5\' 11"  (1.803 m)   Wt 236 lb 9.6 oz (107.3 kg)   SpO2 96%   BMI 33.00 kg/m  Wt Readings from Last 3 Encounters:  05/05/21 236 lb 9.6 oz (107.3 kg)  04/21/21 234 lb 6.4 oz (106.3 kg)  04/06/21 235 lb (106.6 kg)   Constitutional: overweight, in NAD Eyes: PERRLA, EOMI, no exophthalmos ENT: moist mucous  membranes, no thyromegaly, no cervical lymphadenopathy Cardiovascular: RRR, No MRG Respiratory: CTA B Gastrointestinal: abdomen soft, NT, ND, BS+ Musculoskeletal: no deformities, strength intact in all 4 Skin: moist, warm, no rashes Neurological: no tremor with outstretched hands, DTR normal in all 4  ASSESSMENT: 1. DM2, insulin-dependent, uncontrolled, with complications: - CAD with ?h/o AMI - nonischemic CMP - CHF, s/p AICD - Afib, s/p pacemaker - cerebrovascular disease with h/o TIA - CKD stage III - peripheral neuropathy - ED  No family history of medullary thyroid cancer or personal history of pancreatitis.  2. HL  PLAN:  1. Patient with longstanding, uncontrolled, DM2,  on oral antidiabetic regimen with the sugars today during the and also weekly GLP-1 receptor agonist and basal-bolus insulin regimen, with improved control in the last 2 visits, after which we could reduce his insulin doses.  At last visit I also advised him to increase the Trulicity dose. At that time, HbA1c was lower, at 6.7%.  His sugars were mostly at goal with 3 exceptions, with occasional low blood sugars in the early and late afternoon.  He also admitting his insulin before lunch and dinner unless sugars are higher than 110.  I advised him to reduce the dose of NovoLog and we also reduced his Lantus dose especially in the days that he was planning to be active.  The plan was to hopefully be able to eliminate NovoLog completely in the near future. -Since last visit, however, he had a higher HbA1c, at 7.1%, last month CGM interpretation: -At today's visit, we reviewed his CGM downloads: It appears that 75% of values are in target range (goal >70%), while 23% are higher than 180 (goal <25%), and 2% are lower than 70 (goal <4%).  The calculated average blood sugar is 148.  The projected HbA1c for the next 3 months (GMI) is 6.9%. -Reviewing the CGM trends, it appears that even though he is using different doses of  insulin the recommended at last visit (he forgot the instructions), his sugars are fairly well controlled.  They are increasing more after 8 PM, and upon questioning, he is having sweets around that time.  He takes his evening insulin before his dinner around 6 PM but does not take extra insulin before the after dinner snack.  I did advise him to ideally stop the snacks completely, but if he does continue with them, he should reduce the carb content, for example to have a fistful of unsalted nuts.  However, if he continues with banana split or other sweets, he will need to cover them with insulin.  I also advised him to vary the dose of insulin in the morning and before dinner a little more, depending on the size of his meals.  Otherwise, we can continue the same doses of Jardiance and Trulicity and the same dose of Lantus. - I suggested to:  Patient Instructions  Please continue: - Jardiance 10 mg before breakfast - Trulicity 3 mg weekly - Lantus 42 units at bedtime  Please change: - NovoLog  14-18 units before brunch 8-10 units before dinner If you have a snack at night, you may need at least 5 units before the snack  Please return in 4 months.   - advised to check sugars at different times of the day - 4x a day, rotating check times - advised for yearly eye exams >> he is UTD - return to clinic in 4 months  2. HL -Reviewed latest lipid panel from 03/2021: Fractions at goal with exception of high triglycerides (see HPI) -He is on Crestor 20 mg daily, previously on Zocor.  Philemon Kingdom, MD PhD [facility]

## 2021-05-09 ENCOUNTER — Other Ambulatory Visit: Payer: Self-pay | Admitting: Gastroenterology

## 2021-05-09 DIAGNOSIS — I482 Chronic atrial fibrillation, unspecified: Secondary | ICD-10-CM | POA: Diagnosis not present

## 2021-05-09 DIAGNOSIS — Z86010 Personal history of colonic polyps: Secondary | ICD-10-CM | POA: Diagnosis not present

## 2021-05-09 DIAGNOSIS — Z800 Family history of malignant neoplasm of digestive organs: Secondary | ICD-10-CM | POA: Diagnosis not present

## 2021-05-09 DIAGNOSIS — K5900 Constipation, unspecified: Secondary | ICD-10-CM | POA: Diagnosis not present

## 2021-05-10 ENCOUNTER — Telehealth: Payer: Self-pay

## 2021-05-10 NOTE — Telephone Encounter (Signed)
Patient with diagnosis of permanent A fib on Xarelto 20 mg for anticoagulation.    Procedure: colonoscopy Date of procedure: 07/08/21  CHA2DS2-VASc Score = 7  This indicates a 11.2% annual risk of stroke. The patient's score is based upon: CHF History: 1 HTN History: 1 Diabetes History: 1 Stroke History: 2 Vascular Disease History: 0 Age Score: 2 Gender Score: 0   CrCl 57 Platelet count 146K  Pt previously experienced CVA after holding Xarelto for 2 days for a cath in 2019. Recommend holding Xarelto only the evening before his procedure and resuming ASAP after due to elevated CV risk.

## 2021-05-10 NOTE — Telephone Encounter (Signed)
   [facility] Pre-operative Risk Assessment    Patient Name: Mason [name]  DOB: 10-17-1940 MRN: [id number]    Request for surgical clearance:  What type of surgery is being performed  Colonoscopy  When is this surgery scheduled  07/08/21  What type of clearance is required   Pharmacy  Are there any medications that need to be held prior to surgery and how long  [facility] name and name of physician performing surgery [place]  What is the office phone number  [phone]   7.   What is the office fax number  [phone]  8.   Anesthesia type   Propofol   Thedore Mins Simon [place] 05/10/2021, 8:21 AM  _________________________________________________________________   (provider comments below)

## 2021-05-10 NOTE — Telephone Encounter (Signed)
The patient has not been seen since November 2021, although this is a pharmacy clearance, ideally will need to ensure the patient does not have any new cardiac concerns.  I attempted to call both of his numbers, however they kept on ringing without option of leaving a message.

## 2021-05-10 NOTE — Telephone Encounter (Signed)
Clinical pharmacist to review Xarelto 

## 2021-05-11 NOTE — Telephone Encounter (Signed)
Patient returned call

## 2021-05-13 NOTE — Telephone Encounter (Signed)
   Primary Cardiologist: [name], MD  Chart reviewed as part of pre-operative protocol coverage. Given past medical history and time since last visit, based on ACC/AHA guidelines, Mason [name] would be at acceptable risk for the planned procedure without further cardiovascular testing.   Patient with diagnosis of permanent A fib on Xarelto 20 mg for anticoagulation.     Procedure: colonoscopy Date of procedure: 07/08/21   CHA2DS2-VASc Score = 7  This indicates a 11.2% annual risk of stroke. The patient's score is based upon: CHF History: 1 HTN History: 1 Diabetes History: 1 Stroke History: 2 Vascular Disease History: 0 Age Score: 2 Gender Score: 0    CrCl 57 Platelet count 146K   Pt previously experienced CVA after holding Xarelto for 2 days for a cath in 2019. Recommend holding Xarelto only the evening before his procedure and resuming ASAP after due to elevated CV risk.  Patient was advised that if he develops new symptoms prior to surgery to contact our office to arrange a follow-up appointment.  He verbalized understanding.  I will route this recommendation to the requesting party via Epic fax function and remove from pre-op pool.  Please call with questions.  [name]. Johnnye Sandford NP-C    05/13/2021, 1:04 PM [place] [address] Office [phone] Fax [phone]

## 2021-05-31 DIAGNOSIS — L570 Actinic keratosis: Secondary | ICD-10-CM | POA: Diagnosis not present

## 2021-05-31 DIAGNOSIS — L218 Other seborrheic dermatitis: Secondary | ICD-10-CM | POA: Diagnosis not present

## 2021-05-31 DIAGNOSIS — L578 Other skin changes due to chronic exposure to nonionizing radiation: Secondary | ICD-10-CM | POA: Diagnosis not present

## 2021-05-31 DIAGNOSIS — D1801 Hemangioma of skin and subcutaneous tissue: Secondary | ICD-10-CM | POA: Diagnosis not present

## 2021-05-31 DIAGNOSIS — L821 Other seborrheic keratosis: Secondary | ICD-10-CM | POA: Diagnosis not present

## 2021-05-31 DIAGNOSIS — D2361 Other benign neoplasm of skin of right upper limb, including shoulder: Secondary | ICD-10-CM | POA: Diagnosis not present

## 2021-06-08 ENCOUNTER — Telehealth: Payer: Self-pay | Admitting: Cardiovascular Disease

## 2021-06-08 MED ORDER — EPLERENONE 25 MG PO TABS: 25.0000 mg | ORAL_TABLET | Freq: Every day | ORAL | 3 refills | Status: DC

## 2021-06-08 NOTE — Telephone Encounter (Signed)
*  STAT* If patient is at the pharmacy, call can be transferred to refill team.   1. Which medications need to be refilled? (please list name of each medication and dose if known) eplerenone (INSPRA) 25 MG tablet  2. Which pharmacy/location (including street and city if local pharmacy) is medication to be sent to? [facility] [place], [place]  3. Do they need a 30 day or 90 day supply?   90 day supply + 4 refills

## 2021-06-09 ENCOUNTER — Ambulatory Visit (INDEPENDENT_AMBULATORY_CARE_PROVIDER_SITE_OTHER): Payer: Medicare Other | Admitting: Family Medicine

## 2021-06-09 ENCOUNTER — Encounter: Payer: Self-pay | Admitting: Family Medicine

## 2021-06-09 ENCOUNTER — Other Ambulatory Visit: Payer: Self-pay

## 2021-06-09 VITALS — BP 90/63 | HR 80 | Temp 97.4°F | Ht 71.0 in | Wt 232.2 lb

## 2021-06-09 DIAGNOSIS — I2510 Atherosclerotic heart disease of native coronary artery without angina pectoris: Secondary | ICD-10-CM | POA: Diagnosis not present

## 2021-06-09 DIAGNOSIS — M25461 Effusion, right knee: Secondary | ICD-10-CM

## 2021-06-09 DIAGNOSIS — M25561 Pain in right knee: Secondary | ICD-10-CM | POA: Diagnosis not present

## 2021-06-09 DIAGNOSIS — M109 Gout, unspecified: Secondary | ICD-10-CM

## 2021-06-09 MED ORDER — OXYCODONE HCL 5 MG PO TABS: ORAL_TABLET | ORAL | 0 refills | Status: DC

## 2021-06-09 MED ORDER — PREDNISONE 20 MG PO TABS: ORAL_TABLET | ORAL | 0 refills | Status: DC

## 2021-06-09 NOTE — Progress Notes (Addendum)
OFFICE VISIT  06/09/2021  CC:  Chief Complaint  Patient presents with   Knee Pain    Right; started Tuesday night. Has only used tylenol but did not provide any relief   HPI:    Patient is a 80 y.o. male who presents for right knee pain.  INTERIM HX: Onset of pain w/out preceding strain/sprain/trauma 2 nights ago.  Could hardly walk on it.  Swelled up, some redness and warmth.  Tried ice.  Tylenol.  He can't take nsaids b/c on xarelto. Says he thinks he has had gout in R knee before but says no knee fluid testing was ever done for confirmation. No fever or malaise.   Knee plain films 2018-> IMPRESSION: Minimal degenerative changes in the patellofemoral compartment.  Past Medical History:  Diagnosis Date   AICD (automatic cardioverter/defibrillator) present 2018   MDT CRT-D.  Fatigue-->completely pacer dependent.  Pacer settings adjusted 12/2017 to allow more chronotrophc variance with ADL's//exertion.   Arthritis    Pt is s/p eft reverse total shoulder arthroplasty.   Balanitis    chronic fungal   BPH (benign prostatic hypertrophy) 06/2011   Irritative sx's; pt declined trial of anticholinergic per Urology records   Chronic combined systolic and diastolic heart failure ([place]) 05/31/2012   Nonischemic:  EF 40-45%, LA mod-severe dilated, AFIB.   02/2016 EF 40%, diffuse hypokinesis, grade 2 DD.  Myoc perf imaging showed EF 32% 04/2016.  Pt upgraded to CRT-D 01/04/17.   Chronic renal insufficiency, stage III (moderate) (HCC) 2015   CrCl about 60 ml/min   Complete heart block (HCC)    Has dual chamber pacer.   COVID-19 virus infection 01/05/2021   paxlovid   Depression    DOE (dyspnea on exertion)    NYHA class II/III CHF   Dyspnea 2021   with exertion, bending over   Episodic low back pain 01/22/2013   w/intermittent radiculitis (12/2014 his neurologist referred him to pain mgmt for epidural steroid injection)   Erectile dysfunction 2019   due to zoloft--urol rx'd viagra    GERD (gastroesophageal reflux disease)    H/O tilt table evaluation 20/94/7096   negative   Helicobacter pylori gastritis 01/2016   History of adenomatous polyp of colon 10/12/2011   Dr. [name] (3 right side of colon- tubular adenomas removed)   History of cardiovascular stress test 05/28/2012   no ischemia, EF 37%, imaging results are unchanged and within normal variance   History of chronic prostatitis    History of kidney stones    History of vertigo    + Hx of posterior HA's.  Neuro (Dr. Erling Cruz) eval 2011.  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   Hyperlipidemia    Hypertension    Lumbar spondylosis    lumbosacral radiculopathy at L4 by EMG testing, right foot drop (neurologist is Dr. Linus Salmons with [facility] in [place])--neurologist referred him to neurosurgery   Migraine    "used to have them all the time; none for years" (01/04/2017)   Myocardial infarction [facility]) ?1970s   not entirely certain of this   Nephrolithiasis 07/2012   Left UVJ 2 mm stone with dilation of renal collecting system and slight hydroureter on right   Neuropathy    NICM (nonischemic cardiomyopathy) ([place])    a. 02/2018 Cath: LM nl, LAD min irregs, LCX no, RCA 20d. [id number]. Fick CO/CI 4.4/2.0.   Pacemaker 02/05/2012   dual chamber, complete heart block, meddtronic revo, lasted checked  12/2015.  Since no CAD on cath 05/2016, cards recommends upgrade to CRT-D.   Permanent atrial fibrillation ([place])    DCCV 07/09/13-converted, lasted two days, then back into afib--needs lifetime anticoagulation (Xarelto as of 09/2014)   Prostate cancer screening 09/2017   done by urol annually (normal prostate exam documented + PSA 0.84 as of 10/01/17 urol f/u.  10/2018 urol f/u PSA 0.6, no prostate nodule.   Rectus diastasis    Right ankle sprain 08/2017   w/distal fibula avulsion fx noted on u/s but not plain film-(Dr. Hudnall).   Skin cancer of  arm, left    "burned it off" (01/04/2017)   TIA (transient ischemic attack)    L face and L arm weakness. Peri procedural->a. 03/22/2018 following cath. CT head neg. No MRI b/c has pacer. Likely due to embolus to distal branch of RMCA   Type II diabetes mellitus ([place])     Past Surgical History:  Procedure Laterality Date   ABI's Bilateral 05/21/2018   normal   BACK SURGERY     BIV ICD INSERTION CRT-D N/A 01/04/2017   Procedure: BiV ICD ;  Surgeon: Constance Haw, MD;  Location: [place] [facility];  Service: Cardiovascular;  Laterality: N/A;   CARDIAC CATHETERIZATION N/A 06/14/2016   Minimal nonobstructive dz, EF 25-35%.  Procedure: Left Heart Cath and Coronary Angiography;  Surgeon: Peter M Martinique, MD;  Location: [place] [facility];  Service: Cardiovascular;  Laterality: N/A;   CARDIOVASCULAR STRESS TEST  2012   2012 nuclear perfusion study: low risk scan; 04/2016 normal myocardial perfusion imaging, EF 32%.   CARDIOVERSION  07/09/2012   Procedure: CARDIOVERSION;  Surgeon: [name], MD;  Location: [facility];  Service: Cardiovascular;  Laterality: N/A;   CATARACT EXTRACTION W/ INTRAOCULAR LENS IMPLANT & ANTERIOR VITRECTOMY, BILATERAL Bilateral    CIRCUMCISION N/A 03/23/2020   Procedure: CIRCUMCISION ADULT;  Surgeon: Remi Haggard, MD;  Location: [facility];  Service: Urology;  Laterality: N/A;   COLONOSCOPY W/ POLYPECTOMY  approx 2006; repeated 09/2011   Polyps on 2013 EGD as well, repeat 12/2014   ESOPHAGOGASTRODUODENOSCOPY  10/18/06   Done due to chronic GERD: Normal, bx showed no barrett's esophagus (Dr. [name])   [place] Left 10/02/2016   Procedure: LEFT RING FINGER WOUND EXPORATION AND FLEXOR TENDON REPAIR AND NERVE REPAIR;  Surgeon: Milly Jakob, MD;  Location: [place];  Service: Orthopedics;  Laterality: Left;   INSERT / REPLACE / REMOVE PACEMAKER  02/05/2012   dual chamber, sinus node dysfunction, sinus arrest, PAF, Medtronic Revo serial#[id number] H: last checked  05/2015   LUMBAR LAMINECTOMY Left 1976   L4-5   PACEMAKER REMOVAL  01/04/2017   PERMANENT PACEMAKER INSERTION N/A 02/05/2012   Procedure: PERMANENT PACEMAKER INSERTION;  Surgeon: [name], MD; Generator Medtronic [place] model [place] serial number [id number] H Laterality: N/A;   RETINAL DETACHMENT SURGERY Left ~ [place] Left 2018   Left shoulder reverse [name] in [place]).   RIGHT/LEFT HEART CATH AND CORONARY ANGIOGRAPHY N/A 03/22/2018   EF 30-35%, no CAD.  Procedure: RIGHT/LEFT HEART CATH AND CORONARY ANGIOGRAPHY;  Surgeon: Jolaine Artist, MD;  Location: [place] [facility];  Service: Cardiovascular;  Laterality: N/A;   TRANSTHORACIC ECHOCARDIOGRAM  08/25/10; 05/2012; 03/23/16;12/2017   mild asymmetric LVH, normal systolic function, normal diastolic fxn, mild-to-mod mitral regurg, mild aortic valve sclerosis and trace AI, mild aortic root dilatation. 2014 f/u showed EF 40-45%, mod LAE, A FIB.  02/2016 EF 40%, diffuse hypokinesis,  grade 2 DD. 12/2017 EF 35-40%,diffuse hypokin,grd III DD, mild MR   WISDOM TOOTH EXTRACTION      Outpatient Medications Prior to Visit  Medication Sig Dispense Refill   acetaminophen (TYLENOL) 325 MG tablet Take 650 mg by mouth every 6 (six) hours as needed for moderate pain.     BD INSULIN SYRINGE U/F 31G X 5/16" 1 ML MISC USE DAILY AS DIRECTED 100 each 3   COMBIGAN 0.2-0.5 % ophthalmic solution Place 1 drop into both eyes at bedtime.      Continuous Blood Gluc Sensor (FREESTYLE LIBRE 14 DAY SENSOR) MISC Check glucose 4 times per day 6 each 3   Continuous Blood Gluc Sensor (FREESTYLE LIBRE SENSOR SYSTEM) MISC USE TO CHECK BLOOD SUGARS 4 TIMES DAILY. Before breakfast, before lunch, before supper, and at bedtime 1 each 3   diclofenac Sodium (VOLTAREN) 1 % GEL Apply 4 g topically 4 (four) times daily. 100 g 1   Dulaglutide (TRULICITY) 3 DZ/3.2DJ SOPN Inject 3 mg into the skin once a week. 6 mL 3   empagliflozin (JARDIANCE) 10  MG TABS tablet Take 1 tablet (10 mg total) by mouth daily. **Need appointment before anymore refills given** 90 tablet 0   ENTRESTO 97-103 MG TAKE 1 TABLET TWO TIMES A DAY 180 tablet 3   eplerenone (INSPRA) 25 MG tablet Take 1 tablet (25 mg total) by mouth daily. 90 tablet 3   gabapentin (NEURONTIN) 300 MG capsule TAKE 2 CAPSULES TWICE A DAY 360 capsule 3   insulin aspart (NOVOLOG FLEXPEN) 100 UNIT/ML FlexPen INJECT 6-8 UNITS UNDER THE SKIN AT THE TIME OF EACH MEAL 15 mL 4   insulin glargine (LANTUS) 100 UNIT/ML injection Inject 0.36-0.4 mLs (36-40 Units total) into the skin daily. INJECT 54 UNITS UNDER THE SKIN DAILY, WILL BE TITRATING DOSE PERIODICALLY 30 mL 3   Insulin Pen Needle (SURE COMFORT PEN NEEDLES) 31G X 5 MM MISC USE TO INJECT INSULIN UNDER THE SKIN 100 each 11   ketorolac (ACULAR) 0.5 % ophthalmic solution Place 1 drop into the left eye 2 (two) times daily. 5 mL 1   latanoprost (XALATAN) 0.005 % ophthalmic solution Place 1 drop into both eyes at bedtime.   12   lidocaine (LIDODERM) 5 % Place 1 patch onto the skin daily. Remove & Discard patch within 12 hours or as directed by MD 30 patch 0   meclizine (ANTIVERT) 25 MG tablet TAKE 1 TABLET THREE TIMES A DAY AS NEEDED FOR DIZZINESS 270 tablet 3   metoprolol succinate (TOPROL-XL) 50 MG 24 hr tablet TAKE 1 TABLET DAILY. TAKE WITH OR IMMEDIATELY FOLLOWING A MEAL (Patient taking differently: Take by mouth daily. Pt taking 1/2 tablet (25 mg) daily) 90 tablet 0   omeprazole (PRILOSEC) 40 MG capsule Take 1 capsule (40 mg total) by mouth daily. 90 capsule 1   oxybutynin (DITROPAN-XL) 5 MG 24 hr tablet TAKE 1 TABLET AT BEDTIME 90 tablet 3   rOPINIRole (REQUIP) 1 MG tablet TAKE 1 TABLET DAILY 90 tablet 3   rosuvastatin (CRESTOR) 20 MG tablet Take 1 tablet (20 mg total) by mouth daily. 90 tablet 3   sertraline (ZOLOFT) 100 MG tablet TAKE 1 TABLET DAILY 90 tablet 0   XARELTO 20 MG TABS tablet TAKE 1 TABLET DAILY WITH SUPPER (Patient taking  differently: Take 20 mg by mouth daily with supper.) 90 tablet 3   methocarbamol (ROBAXIN) 500 MG tablet Take 1 tablet (500 mg total) by mouth 2 (two) times daily. (Patient not taking:  Reported on 06/09/2021) 20 tablet 0   No facility-administered medications prior to visit.    No Known Allergies  ROS As per HPI  PE: Vitals with BMI 06/09/2021 05/05/2021 04/21/2021  Height 5\' 11"  5\' 11"  5\' 11"   Weight 232 lbs 3 oz 236 lbs 10 oz 234 lbs 6 oz  BMI 32.4 16.10 96.04  Systolic 90 540 981  Diastolic 63 80 67  Pulse 80 84 74   General: Alert and well-appearing, pleasant. Right knee with moderate effusion.  No erythema but warm to touch. Mild tenderness around the anterior knee diffusely.  He can extend right knee fully but could only flex to about 90 degrees.  No instability.  LABS:  Lab Results  Component Value Date   LABURIC 4.6 02/19/2017     Chemistry      Component Value Date/Time   NA 136 10/04/2020 1412   NA 139 12/28/2016 1405   K 4.6 10/04/2020 1412   CL 103 10/04/2020 1412   CO2 23 10/04/2020 1412   BUN 23 10/04/2020 1412   BUN 25 12/28/2016 1405   CREATININE 1.34 10/04/2020 1412   CREATININE 1.21 (H) 06/06/2016 1312      Component Value Date/Time   CALCIUM 9.3 10/04/2020 1412   ALKPHOS 84 10/04/2020 1412   AST 15 10/04/2020 1412   ALT 12 10/04/2020 1412   BILITOT 0.5 10/04/2020 1412     Lab Results  Component Value Date   WBC 5.8 10/04/2020   HGB 16.9 10/04/2020   HCT 48.8 10/04/2020   MCV 89.3 10/04/2020   PLT 146.0 (L) 10/04/2020   Lab Results  Component Value Date   HGBA1C 6.7 (A) 12/09/2020   IMPRESSION AND PLAN:  Acute right knee pain and effusion.  No signs of systemic illness.  No preceding strain or injury.  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   Bedside ultrasound today showed anechoic fluid in suprapatellar pouch extending to a significant extent medially and laterally. No fluid in the prepatellar bursa.  Would like to aspirate knee to assess the  fluid but patient is on anticoagulant and at this time I feel it is better to hold off and will treat with prednisone and see how things go-- 40 mg a day for 5 days.  For additional pain control oxycodone 5 mg 1-2 every 6 hours as needed, #20. Therapeutic expectations and side effect profile of medication discussed today.  Patient's questions answered. Signs/symptoms to call or return for were reviewed and pt expressed understanding. Check CBC with differential, BMet, uric acid, sed rate, and CRP today  An After Visit Summary was printed and given to the patient.  FOLLOW UP: Return if symptoms worsen or fail to improve.  Signed:  Crissie Sickles, MD           06/09/2021

## 2021-06-10 LAB — URIC ACID: Uric Acid, Serum: 5.7 mg/dL (ref 4.0–7.8)

## 2021-06-10 LAB — CBC WITH DIFFERENTIAL/PLATELET
Basophils Absolute: 0 10*3/uL (ref 0.0–0.1)
Basophils Relative: 0.7 % (ref 0.0–3.0)
Eosinophils Absolute: 0.1 10*3/uL (ref 0.0–0.7)
Eosinophils Relative: 2.4 % (ref 0.0–5.0)
HCT: 49.2 % (ref 39.0–52.0)
Hemoglobin: 16 g/dL (ref 13.0–17.0)
Lymphocytes Relative: 24.2 % (ref 12.0–46.0)
Lymphs Abs: 1.4 10*3/uL (ref 0.7–4.0)
MCHC: 32.5 g/dL (ref 30.0–36.0)
MCV: 92.5 fl (ref 78.0–100.0)
Monocytes Absolute: 0.6 10*3/uL (ref 0.1–1.0)
Monocytes Relative: 10.6 % (ref 3.0–12.0)
Neutro Abs: 3.5 10*3/uL (ref 1.4–7.7)
Neutrophils Relative %: 62.1 % (ref 43.0–77.0)
Platelets: 129 10*3/uL — ABNORMAL LOW (ref 150.0–400.0)
RBC: 5.33 Mil/uL (ref 4.22–5.81)
RDW: 13.6 % (ref 11.5–15.5)
WBC: 5.6 10*3/uL (ref 4.0–10.5)

## 2021-06-10 LAB — BASIC METABOLIC PANEL
BUN: 25 mg/dL — ABNORMAL HIGH (ref 6–23)
CO2: 32 mEq/L (ref 19–32)
Calcium: 9 mg/dL (ref 8.4–10.5)
Chloride: 100 mEq/L (ref 96–112)
Creatinine, Ser: 1.51 mg/dL — ABNORMAL HIGH (ref 0.40–1.50)
GFR: 43.85 mL/min — ABNORMAL LOW (ref >60.00)
Glucose, Bld: 113 mg/dL — ABNORMAL HIGH (ref 70–99)
Potassium: 4.2 mEq/L (ref 3.5–5.1)
Sodium: 139 mEq/L (ref 135–145)

## 2021-06-10 LAB — SEDIMENTATION RATE: Sed Rate: 22 mm/hr — ABNORMAL HIGH (ref 0–20)

## 2021-06-10 LAB — C-REACTIVE PROTEIN: CRP: 2 mg/dL (ref 0.5–20.0)

## 2021-06-13 ENCOUNTER — Encounter: Payer: Self-pay | Admitting: Cardiovascular Disease

## 2021-06-13 ENCOUNTER — Ambulatory Visit (INDEPENDENT_AMBULATORY_CARE_PROVIDER_SITE_OTHER): Payer: Medicare Other | Admitting: Cardiovascular Disease

## 2021-06-13 ENCOUNTER — Other Ambulatory Visit: Payer: Self-pay

## 2021-06-13 VITALS — BP 120/80 | HR 77 | Ht 71.0 in | Wt 234.8 lb

## 2021-06-13 DIAGNOSIS — I442 Atrioventricular block, complete: Secondary | ICD-10-CM | POA: Diagnosis not present

## 2021-06-13 DIAGNOSIS — E669 Obesity, unspecified: Secondary | ICD-10-CM | POA: Diagnosis not present

## 2021-06-13 DIAGNOSIS — I5022 Chronic systolic (congestive) heart failure: Secondary | ICD-10-CM

## 2021-06-13 DIAGNOSIS — I739 Peripheral vascular disease, unspecified: Secondary | ICD-10-CM

## 2021-06-13 DIAGNOSIS — I4821 Permanent atrial fibrillation: Secondary | ICD-10-CM | POA: Diagnosis not present

## 2021-06-13 DIAGNOSIS — I10 Essential (primary) hypertension: Secondary | ICD-10-CM | POA: Diagnosis not present

## 2021-06-13 DIAGNOSIS — Z95810 Presence of automatic (implantable) cardiac defibrillator: Secondary | ICD-10-CM | POA: Diagnosis not present

## 2021-06-13 DIAGNOSIS — E1151 Type 2 diabetes mellitus with diabetic peripheral angiopathy without gangrene: Secondary | ICD-10-CM | POA: Diagnosis not present

## 2021-06-13 DIAGNOSIS — Z7901 Long term (current) use of anticoagulants: Secondary | ICD-10-CM

## 2021-06-13 DIAGNOSIS — I2510 Atherosclerotic heart disease of native coronary artery without angina pectoris: Secondary | ICD-10-CM | POA: Diagnosis not present

## 2021-06-13 DIAGNOSIS — E785 Hyperlipidemia, unspecified: Secondary | ICD-10-CM | POA: Diagnosis not present

## 2021-06-13 MED ORDER — FUROSEMIDE 20 MG PO TABS: 20.0000 mg | ORAL_TABLET | Freq: Every day | ORAL | 5 refills | Status: DC | PRN

## 2021-06-13 NOTE — Patient Instructions (Signed)
Medication Instructions:  TAKE Furosemide 20 mg as needed for a weight gain of 3 pounds overnight or 5 pounds in one week  *If you need a refill on your cardiac medications before your next appointment, please call your pharmacy*   Lab Work: None ordered If you have labs (blood work) drawn today and your tests are completely normal, you will receive your results only by: [place] (if you have MyChart) OR A paper copy in the mail If you have any lab test that is abnormal or we need to change your treatment, we will call you to review the results.   Testing/Procedures: Your physician has requested that you have an echocardiogram. Echocardiography is a painless test that uses sound waves to create images of your heart. It provides your doctor with information about the size and shape of your heart and how well your heart's chambers and valves are working. You may receive an ultrasound enhancing agent through an IV if needed to better visualize your heart during the echo.This procedure takes approximately one hour. There are no restrictions for this procedure. This will take place at the [address], [address].   Follow-Up: At [facility], you and your health needs are our priority.  As part of our continuing mission to provide you with exceptional heart care, we have created designated Provider Care Teams.  These Care Teams include your primary Cardiologist (physician) and Advanced Practice Providers (APPs -  Physician Assistants and Nurse Practitioners) who all work together to provide you with the care you need, when you need it.  We recommend signing up for the patient portal called "MyChart".  Sign up information is provided on this After Visit Summary.  MyChart is used to connect with patients for Virtual Visits (Telemedicine).  Patients are able to view lab/test results, encounter notes, upcoming appointments, etc.  Non-urgent messages can be sent to your provider as well.    To learn more about what you can do with MyChart, go to [email or web address].    Your next appointment:   6 month(s)  The format for your next appointment:   In Person  Provider:   [name], MD

## 2021-06-13 NOTE — Progress Notes (Signed)
`   Cardiology Office Note    Date:  06/19/2021   ID:  Mason [name], DOB [birth year], MRN [id number]  PCP:  Tammi Sou, MD  Cardiologist:   [name], MD   Chief Complaint  Patient presents with   Congestive Heart Failure    History of Present Illness:  Mason [name] is a 80 y.o. male with nonischemic cardiomyopathy, permanent atrial fibrillation and complete heart block who underwent implantation of a CRT-D in June 2018 for deteriorating left ventricular systolic function.  He is pacemaker dependent.  Mason [name] does not think that his level of activity has changed, but he now complains of shortness of breath when he bends over.  He is not taking loop diuretics, but is on Entresto, eplerenone and Jardiance which will have intrinsic diuretic effect.  His weight has not increased, but he has gradually been losing weight because he is trying to eat better.  He denies orthopnea and PND and lower extremity edema.  He does not have chest pain.  He has not had dizziness, palpitations, syncope, claudication or focal neurological complaints.  He has not had defibrillator discharges.  Denies any falls, serious injuries or bleeding problems.  Compliant with Xarelto.  Interrogation of his device does show that his thoracic impedance/OptiVol was close to exceeding the threshold recently, but just over the last few days corrected to baseline.  He is really never crossed to the heart failure "threshold" in the last 12 months.  Estimated generator longevity is 2.3 years.  He has 99.1% effective biventricular pacing.  Lead parameters remain excellent.  He has had 2 episodes of high ventricular rate.  1 lasted for 11 beats, the other episode on October 15 around midnight was actually a sequence of 4 brief episodes of nonsustained VT consisting of 15, 16 and 4 beats respectively.  It was asymptomatic (but he was probably asleep at the time).   Left ventricular pacing is in the LV 3-RV coil configuration.   He is pacemaker dependent without any escape rhythm when pacing is taken down to 30 bpm. Presenting rhythm is atrial fibrillation with biventricular pacing.  His ECG shows a distinct positive R wave in lead V1, although the QRS is quite broad at 176 ms.  With weight loss, there has been substantial improvement in his metabolic parameters.  His hemoglobin A1c is 6.7% in May (and by his report a month ago was down to 6.4%).  As before, his lipid profile is pretty good other than his HDL which remains low at 39.  Switched from spironolactone to eplerenone due to gynecomastia, with resolution of the side effects.   He does not have coronary or other vascular disorders, but has numerous risk factors including type 2 diabetes, hyperlipidemia, mild obesity. He has a history of gout.  He has gained about 20 pounds since his last appointment.  He is now taking both Lantus and Jardiance.  The cause of Mason [name]'s cardiomyopathy remains uncertain.  He initially presented with a "heart attack" and was in the ICU in the [facility] at [facility] for 3 to 4 days in 1976.  He has had a depressed left ventricular systolic function for many years.  Cardiac catheterization most recently performed in 2019 does show nonobstructive atherosclerosis.[name]  He has a Water engineer including 2 stints in [place] and he knows that he was exposed to [employer].  He underwent right and left heart catheterization for optimization of heart failure therapy on March 22, 2018.  Right atrial pressure was 7 mmHg, wedge pressure was 13 and PA pressure was 30/10 (mean 22) millimeters Hg.  The cardiac index was 2.0.  The coronary arteries had only minimal atherosclerosis.  Radial artery access was difficult.  On the catheterization he developed transient weakness of the left upper extremity and left facial droop, from which he recovered fully thankfully.  His dose of beta-blocker was reduced.  He is not on diuretics.  He subsequently  underwent cardiopulmonary stress testing on September 18 and did quite well with a mildly reduced peak VO2 of 16.8 mL/kg/min (79% of control).  There was still evidence of drug-induced chronotropic incompetence with a maximum heart rate only reached 74% of predicted.     Past Medical History:  Diagnosis Date   AICD (automatic cardioverter/defibrillator) present 2018   MDT CRT-D.  Fatigue-->completely pacer dependent.  Pacer settings adjusted 12/2017 to allow more chronotrophc variance with ADL's//exertion.   Arthritis    Pt is s/p eft reverse total shoulder arthroplasty.   Balanitis    chronic fungal   BPH (benign prostatic hypertrophy) 06/2011   Irritative sx's; pt declined trial of anticholinergic per Urology records   Chronic combined systolic and diastolic heart failure ([place]) 05/31/2012   Nonischemic:  EF 40-45%, LA mod-severe dilated, AFIB.   02/2016 EF 40%, diffuse hypokinesis, grade 2 DD.  Myoc perf imaging showed EF 32% 04/2016.  Pt upgraded to CRT-D 01/04/17.   Chronic renal insufficiency, stage III (moderate) (HCC) 2015   CrCl about 60 ml/min   Complete heart block (HCC)    Has dual chamber pacer.   COVID-19 virus infection 01/05/2021   paxlovid   Depression    DOE (dyspnea on exertion)    NYHA class II/III CHF   Dyspnea 2021   with exertion, bending over   Episodic low back pain 01/22/2013   w/intermittent radiculitis (12/2014 his neurologist referred him to pain mgmt for epidural steroid injection)   Erectile dysfunction 2019   due to zoloft--urol rx'd viagra   GERD (gastroesophageal reflux disease)    H/O tilt table evaluation 41/66/0630   negative   Helicobacter pylori gastritis 01/2016   History of adenomatous polyp of colon 10/12/2011   Dr. [name] (3 right side of colon- tubular adenomas removed)   History of cardiovascular stress test 05/28/2012   no ischemia, EF 37%, imaging results are unchanged and within normal variance   History of chronic prostatitis    History  of kidney stones    History of vertigo    + Hx of posterior HA's.  Neuro (Dr. Erling Cruz) eval 2011.  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   Hyperlipidemia    Hypertension    Lumbar spondylosis    lumbosacral radiculopathy at L4 by EMG testing, right foot drop (neurologist is Dr. Linus Salmons with [facility] in [place])--neurologist referred him to neurosurgery   Migraine    "used to have them all the time; none for years" (01/04/2017)   Myocardial infarction [facility]) ?1970s   not entirely certain of this   Nephrolithiasis 07/2012   Left UVJ 2 mm stone with dilation of renal collecting system and slight hydroureter on right   Neuropathy    NICM (nonischemic cardiomyopathy) ([place])    a. 02/2018 Cath: LM nl, LAD min irregs, LCX no, RCA 20d. [id number]. Fick CO/CI 4.4/2.0.   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  Since no  CAD on cath 05/2016, cards recommends upgrade to CRT-D.   Permanent atrial fibrillation ([place])    DCCV 07/09/13-converted, lasted two days, then back into afib--needs lifetime anticoagulation (Xarelto as of 09/2014)   Prostate cancer screening 09/2017   done by urol annually (normal prostate exam documented + PSA 0.84 as of 10/01/17 urol f/u.  10/2018 urol f/u PSA 0.6, no prostate nodule.   Rectus diastasis    Right ankle sprain 08/2017   w/distal fibula avulsion fx noted on u/s but not plain film-(Dr. Hudnall).   Skin cancer of arm, left    "burned it off" (01/04/2017)   TIA (transient ischemic attack)    L face and L arm weakness. Peri procedural->a. 03/22/2018 following cath. CT head neg. No MRI b/c has pacer. Likely due to embolus to distal branch of RMCA   Type II diabetes mellitus ([place])     Past Surgical History:  Procedure Laterality Date   ABI's Bilateral 05/21/2018   normal   BACK SURGERY     BIV ICD INSERTION CRT-D N/A 01/04/2017    Procedure: BiV ICD ;  Surgeon: Constance Haw, MD;  Location: [place] [facility];  Service: Cardiovascular;  Laterality: N/A;   CARDIAC CATHETERIZATION N/A 06/14/2016   Minimal nonobstructive dz, EF 25-35%.  Procedure: Left Heart Cath and Coronary Angiography;  Surgeon: Peter M Martinique, MD;  Location: [place] [facility];  Service: Cardiovascular;  Laterality: N/A;   CARDIOVASCULAR STRESS TEST  2012   2012 nuclear perfusion study: low risk scan; 04/2016 normal myocardial perfusion imaging, EF 32%.   CARDIOVERSION  07/09/2012   Procedure: CARDIOVERSION;  Surgeon: [name], MD;  Location: [facility];  Service: Cardiovascular;  Laterality: N/A;   CATARACT EXTRACTION W/ INTRAOCULAR LENS IMPLANT & ANTERIOR VITRECTOMY, BILATERAL Bilateral    CIRCUMCISION N/A 03/23/2020   Procedure: CIRCUMCISION ADULT;  Surgeon: Remi Haggard, MD;  Location: [facility];  Service: Urology;  Laterality: N/A;   COLONOSCOPY W/ POLYPECTOMY  approx 2006; repeated 09/2011   Polyps on 2013 EGD as well, repeat 12/2014   ESOPHAGOGASTRODUODENOSCOPY  10/18/06   Done due to chronic GERD: Normal, bx showed no barrett's esophagus (Dr. [name])   [place] Left 10/02/2016   Procedure: LEFT RING FINGER WOUND EXPORATION AND FLEXOR TENDON REPAIR AND NERVE REPAIR;  Surgeon: Milly Jakob, MD;  Location: [facility];  Service: Orthopedics;  Laterality: Left;   INSERT / REPLACE / REMOVE PACEMAKER  02/05/2012   dual chamber, sinus node dysfunction, sinus arrest, PAF, Medtronic Revo serial#-[id number] H: last checked 05/2015   LUMBAR LAMINECTOMY Left 1976   L4-5   PACEMAKER REMOVAL  01/04/2017   PERMANENT PACEMAKER INSERTION N/A 02/05/2012   Procedure: PERMANENT PACEMAKER INSERTION;  Surgeon: [name], MD; Generator Medtronic [place] model [place] serial number [id number] H Laterality: N/A;   RETINAL DETACHMENT SURGERY Left ~ [place] Left 2018   Left shoulder reverse [name] in [place]).    RIGHT/LEFT HEART CATH AND CORONARY ANGIOGRAPHY N/A 03/22/2018   EF 30-35%, no CAD.  Procedure: RIGHT/LEFT HEART CATH AND CORONARY ANGIOGRAPHY;  Surgeon: Jolaine Artist, MD;  Location: [place] [facility];  Service: Cardiovascular;  Laterality: N/A;   TRANSTHORACIC ECHOCARDIOGRAM  08/25/10; 05/2012; 03/23/16;12/2017   mild asymmetric LVH, normal systolic function, normal diastolic fxn, mild-to-mod mitral regurg, mild aortic valve sclerosis and trace AI, mild aortic root dilatation. 2014 f/u showed EF 40-45%, mod LAE, A FIB.  02/2016 EF 40%, diffuse hypokinesis, grade 2 DD. 12/2017  EF 35-40%,diffuse hypokin,grd III DD, mild MR   WISDOM TOOTH EXTRACTION      Current Medications: Outpatient Medications Prior to Visit  Medication Sig Dispense Refill   BD INSULIN SYRINGE U/F 31G X 5/16" 1 ML MISC USE DAILY AS DIRECTED 100 each 3   COMBIGAN 0.2-0.5 % ophthalmic solution Place 1 drop into both eyes at bedtime.      Continuous Blood Gluc Sensor (FREESTYLE LIBRE 14 DAY SENSOR) MISC Check glucose 4 times per day 6 each 3   Continuous Blood Gluc Sensor (FREESTYLE LIBRE SENSOR SYSTEM) MISC USE TO CHECK BLOOD SUGARS 4 TIMES DAILY. Before breakfast, before lunch, before supper, and at bedtime 1 each 3   diclofenac Sodium (VOLTAREN) 1 % GEL Apply 4 g topically 4 (four) times daily. 100 g 1   Dulaglutide (TRULICITY) 3 NU/2.7OZ SOPN Inject 3 mg into the skin once a week. 6 mL 3   empagliflozin (JARDIANCE) 10 MG TABS tablet Take 1 tablet (10 mg total) by mouth daily. **Need appointment before anymore refills given** 90 tablet 0   ENTRESTO 97-103 MG TAKE 1 TABLET TWO TIMES A DAY 180 tablet 3   eplerenone (INSPRA) 25 MG tablet Take 1 tablet (25 mg total) by mouth daily. 90 tablet 3   insulin aspart (NOVOLOG FLEXPEN) 100 UNIT/ML FlexPen INJECT 6-8 UNITS UNDER THE SKIN AT THE TIME OF EACH MEAL 15 mL 4   insulin glargine (LANTUS) 100 UNIT/ML injection Inject 0.36-0.4 mLs (36-40 Units total) into the skin daily. INJECT  54 UNITS UNDER THE SKIN DAILY, WILL BE TITRATING DOSE PERIODICALLY 30 mL 3   Insulin Pen Needle (SURE COMFORT PEN NEEDLES) 31G X 5 MM MISC USE TO INJECT INSULIN UNDER THE SKIN 100 each 11   ketorolac (ACULAR) 0.5 % ophthalmic solution Place 1 drop into the left eye 2 (two) times daily. 5 mL 1   latanoprost (XALATAN) 0.005 % ophthalmic solution Place 1 drop into both eyes at bedtime.   12   lidocaine (LIDODERM) 5 % Place 1 patch onto the skin daily. Remove & Discard patch within 12 hours or as directed by MD 30 patch 0   meclizine (ANTIVERT) 25 MG tablet TAKE 1 TABLET THREE TIMES A DAY AS NEEDED FOR DIZZINESS 270 tablet 3   metoprolol succinate (TOPROL-XL) 50 MG 24 hr tablet TAKE 1 TABLET DAILY. TAKE WITH OR IMMEDIATELY FOLLOWING A MEAL (Patient taking differently: Take by mouth daily. Pt taking 1/2 tablet (25 mg) daily) 90 tablet 0   omeprazole (PRILOSEC) 40 MG capsule Take 1 capsule (40 mg total) by mouth daily. 90 capsule 1   oxybutynin (DITROPAN-XL) 5 MG 24 hr tablet TAKE 1 TABLET AT BEDTIME 90 tablet 3   oxyCODONE (OXY IR/ROXICODONE) 5 MG immediate release tablet 1-2 tabs po q6h prn pain 20 tablet 0   predniSONE (DELTASONE) 20 MG tablet 2 tabs po qd x 5d 10 tablet 0   rOPINIRole (REQUIP) 1 MG tablet TAKE 1 TABLET DAILY 90 tablet 3   rosuvastatin (CRESTOR) 20 MG tablet Take 1 tablet (20 mg total) by mouth daily. 90 tablet 3   sertraline (ZOLOFT) 100 MG tablet TAKE 1 TABLET DAILY 90 tablet 0   XARELTO 20 MG TABS tablet TAKE 1 TABLET DAILY WITH SUPPER (Patient taking differently: Take 20 mg by mouth daily with supper.) 90 tablet 3   acetaminophen (TYLENOL) 325 MG tablet Take 650 mg by mouth every 6 (six) hours as needed for moderate pain. (Patient not taking: Reported on 06/13/2021)  gabapentin (NEURONTIN) 300 MG capsule TAKE 2 CAPSULES TWICE A DAY (Patient not taking: Reported on 06/13/2021) 360 capsule 3   No facility-administered medications prior to visit.     Allergies:   Patient has no  known allergies.   Social History   Socioeconomic History   Marital status: Married    Spouse name: Not on file   Number of children: Not on file   Years of education: Not on file   Highest education level: Not on file  Occupational History   Not on file  Tobacco Use   Smoking status: Never   Smokeless tobacco: Never  Vaping Use   Vaping Use: Never used  Substance and Sexual Activity   Alcohol use: Yes    Comment: occ   Drug use: No   Sexual activity: Not on file  Other Topics Concern   Not on file  Social History Narrative   Not on file   Social Determinants of Health   Financial Resource Strain: Low Risk    Difficulty of Paying Living Expenses: Not hard at all  Food Insecurity: No Food Insecurity   Worried About Estate manager/land agent of Food in the Last Year: Never true   [place] in the Last Year: Never true  Transportation Needs: No Transportation Needs   Lack of Transportation (Medical): No   Lack of Transportation (Non-Medical): No  Physical Activity: Sufficiently Active   Days of Exercise per Week: 7 days   Minutes of Exercise per Session: 30 min  Stress: No Stress Concern Present   Feeling of Stress : Not at all  Social Connections: Moderately Integrated   Frequency of Communication with Friends and Family: More than three times a week   Frequency of Social Gatherings with Friends and Family: More than three times a week   Attends Religious Services: Never   Marine scientist or Organizations: Yes   Attends Music therapist: More than 4 times per year   Marital Status: Married     Family History:  The patient's family history includes Cancer in his brother, brother, and sister; Heart disease in his brother, sister, and sister; Heart failure in his mother; Stroke in his father and mother.   ROS:   Please see the history of present illness.    ROS All other systems are reviewed and are negative.   PHYSICAL EXAM:   VS:  BP 120/80 (BP  Location: Left Arm, Patient Position: Sitting, Cuff Size: Large)   Pulse 77   Ht 5\' 11"  (1.803 m)   Wt 234 lb 12.8 oz (106.5 kg)   SpO2 98%   BMI 32.75 kg/m      General: Alert, oriented x3, no distress, mildly obese, healthy with subclavian defibrillator site Head: no evidence of trauma, PERRL, EOMI, no exophtalmos or lid lag, no myxedema, no xanthelasma; normal ears, nose and oropharynx Neck: normal jugular venous pulsations and no hepatojugular reflux; brisk carotid pulses without delay and no carotid bruits Chest: clear to auscultation, no signs of consolidation by percussion or palpation, normal fremitus, symmetrical and full respiratory excursions Cardiovascular: normal position and quality of the apical impulse, regular rhythm, normal first and second heart sounds, no murmurs, rubs or gallops Abdomen: no tenderness or distention, no masses by palpation, no abnormal pulsatility or arterial bruits, normal bowel sounds, no hepatosplenomegaly Extremities: no clubbing, cyanosis or edema; 2+ radial, ulnar and brachial pulses bilaterally; 2+ right femoral, posterior tibial and dorsalis pedis pulses; 2+ left femoral,  posterior tibial and dorsalis pedis pulses; no subclavian or femoral bruits Neurological: grossly nonfocal Psych: Normal mood and affect   Wt Readings from Last 3 Encounters:  06/13/21 234 lb 12.8 oz (106.5 kg)  06/09/21 232 lb 3.2 oz (105.3 kg)  05/05/21 236 lb 9.6 oz (107.3 kg)     Studies/Labs Reviewed:   ECHO 2019:  - Left ventricle: The cavity size was normal. Wall thickness was    increased in a pattern of mild LVH. Systolic function was    moderately reduced. The estimated ejection fraction was in the    range of 35% to 40%. Diffuse hypokinesis. Doppler parameters are    consistent with a reversible restrictive pattern, indicative of    decreased left ventricular diastolic compliance and/or increased    left atrial pressure (grade 3 diastolic dysfunction).  -  Aorta: Aortic root dimension: 39 mm (ED).  - Ascending aorta: The ascending aorta was mildly dilated.  - Mitral valve: There was mild regurgitation.  - Left atrium: The atrium was moderately dilated. Volume/bsa, ES    (1-plane Simpson&'s, A4C): 40 ml/m^2.  - Right ventricle: Pacer wire or catheter noted in right ventricle.  - Right atrium: The atrium was mildly dilated. Pacer wire or    catheter noted in right atrium.  - Tricuspid valve: There was mild regurgitation.   EKG: EKG is ordered today.  Personally reviewed shows atrial fibrillation with 100% biventricular pacing and a distinct positive R wave in lead V1, QRS 176 ms, QTC 511 ms.  Recent Labs: 10/04/2020: ALT 12 06/09/2021: BUN 25; Creatinine, Ser 1.51; Hemoglobin 16.0; Platelets 129.0; Potassium 4.2; Sodium 139   Lipid Panel    Component Value Date/Time   CHOL 168 06/22/2020 1015   TRIG 215.0 (H) 06/22/2020 1015   HDL 38.80 (L) 06/22/2020 1015   CHOLHDL 4 06/22/2020 1015   VLDL 43.0 (H) 06/22/2020 1015   LDLCALC 85 03/16/2020 1010   LDLDIRECT 103.0 06/22/2020 1015     ASSESSMENT:    1. Chronic systolic heart failure ([place])   2. CHB (complete heart block) (HCC)   3. Permanent atrial fibrillation ([place])   4. Long term (current) use of anticoagulants   5. 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 2018   6. Essential hypertension   7. PAD (peripheral artery disease) ([place])   8. Type 2 diabetes mellitus with peripheral vascular disease ([place])   9. Dyslipidemia (high LDL; low HDL)   10. Mild obesity      PLAN:  In order of problems listed above:  CHF: Clinically he appears euvolemic and this is confirmed by his OptiVol.  NYHA functional class II, but recently has dyspnea bending over.  On maximum dose Entresto, metoprolol succinate, empagliflozin and eplerenone (switched from spironolactone due to gynecomastia).  Reviewed the importance of sodium restriction, encouraged daily weights and  discussed "as needed" dosing of loop diuretics, for overnight weight gain of 3 pounds or 5 pounds weight gain in a week. CHB: Pacemaker dependent. AFib: Rate control not an issue due to complete heart block.  Permanent arrhythmia.  Appropriately anticoagulated.  CHADSVasc 5 (age 89, diabetes, heart failure, hypertension).  No history of embolic events. Xarelto: Denies bleeding problems. CRT-D: Normal device function.  Greater than 99% biventricular pacing efficiency.  Rate response settings appear appropriate based on heart rate histograms. HTN: Very well controlled. PAD: Very mild abnormality in the left lower extremity ABI.  Denies claudication. DM/obesity/dyslipidemia: Improved glycemic control with hemoglobin A1c now in target range losing weight.  Lipids are checked at the [facility] hospital.  They were close to target range last year and probably have improved.  On excellent diabetes regimen that includes an SGLT2 inhibitor and a GLP-1 agonist, both of which are probably helping with weight loss.  Still requires insulin.    Medication Adjustments/Labs and Tests Ordered: Current medicines are reviewed at length with the patient today.  Concerns regarding medicines are outlined above.  Medication changes, Labs and Tests ordered today are listed in the Patient Instructions below. Patient Instructions  Medication Instructions:  TAKE Furosemide 20 mg as needed for a weight gain of 3 pounds overnight or 5 pounds in one week  *If you need a refill on your cardiac medications before your next appointment, please call your pharmacy*   Lab Work: None ordered If you have labs (blood work) drawn today and your tests are completely normal, you will receive your results only by: [place] (if you have MyChart) OR A paper copy in the mail If you have any lab test that is abnormal or we need to change your treatment, we will call you to review the results.   Testing/Procedures: Your physician has  requested that you have an echocardiogram. Echocardiography is a painless test that uses sound waves to create images of your heart. It provides your doctor with information about the size and shape of your heart and how well your heart's chambers and valves are working. You may receive an ultrasound enhancing agent through an IV if needed to better visualize your heart during the echo.This procedure takes approximately one hour. There are no restrictions for this procedure. This will take place at the [address].   Follow-Up: At [facility], you and your health needs are our priority.  As part of our continuing mission to provide you with exceptional heart care, we have created designated Provider Care Teams.  These Care Teams include your primary Cardiologist (physician) and Advanced Practice Providers (APPs -  Physician Assistants and Nurse Practitioners) who all work together to provide you with the care you need, when you need it.  We recommend signing up for the patient portal called "MyChart".  Sign up information is provided on this After Visit Summary.  MyChart is used to connect with patients for Virtual Visits (Telemedicine).  Patients are able to view lab/test results, encounter notes, upcoming appointments, etc.  Non-urgent messages can be sent to your provider as well.   To learn more about what you can do with MyChart, go to [email or web address].    Your next appointment:   6 month(s)  The format for your next appointment:   In Person  Provider:   [name], MD       Signed, [name], MD  06/19/2021 4:44 PM    [facility] [address], [place], [place]  [address] Phone: [phone]; Fax: [phone]

## 2021-06-19 ENCOUNTER — Encounter: Payer: Self-pay | Admitting: Cardiovascular Disease

## 2021-06-24 ENCOUNTER — Ambulatory Visit (INDEPENDENT_AMBULATORY_CARE_PROVIDER_SITE_OTHER): Payer: Medicare Other | Admitting: Registered Nurse

## 2021-06-24 ENCOUNTER — Encounter: Payer: Self-pay | Admitting: Registered Nurse

## 2021-06-24 ENCOUNTER — Other Ambulatory Visit: Payer: Self-pay

## 2021-06-24 VITALS — BP 104/67 | HR 80 | Temp 98.1°F | Resp 18 | Ht 71.0 in | Wt 234.8 lb

## 2021-06-24 DIAGNOSIS — J22 Unspecified acute lower respiratory infection: Secondary | ICD-10-CM | POA: Diagnosis not present

## 2021-06-24 DIAGNOSIS — G4483 Primary cough headache: Secondary | ICD-10-CM | POA: Diagnosis not present

## 2021-06-24 DIAGNOSIS — R062 Wheezing: Secondary | ICD-10-CM

## 2021-06-24 DIAGNOSIS — R6889 Other general symptoms and signs: Secondary | ICD-10-CM

## 2021-06-24 DIAGNOSIS — Z20822 Contact with and (suspected) exposure to covid-19: Secondary | ICD-10-CM | POA: Diagnosis not present

## 2021-06-24 LAB — POC COVID19 BINAXNOW: SARS Coronavirus 2 Ag: NEGATIVE

## 2021-06-24 LAB — POCT INFLUENZA A/B
Influenza A, POC: NEGATIVE
Influenza B, POC: NEGATIVE

## 2021-06-24 MED ORDER — ALBUTEROL SULFATE HFA 108 (90 BASE) MCG/ACT IN AERS: 2.0000 | INHALATION_SPRAY | Freq: Four times a day (QID) | RESPIRATORY_TRACT | 0 refills | Status: DC | PRN

## 2021-06-24 MED ORDER — PREDNISONE 20 MG PO TABS: 20.0000 mg | ORAL_TABLET | Freq: Every day | ORAL | 0 refills | Status: DC

## 2021-06-24 MED ORDER — DM-GUAIFENESIN ER 30-600 MG PO TB12: 1.0000 | ORAL_TABLET | Freq: Two times a day (BID) | ORAL | 0 refills | Status: DC

## 2021-06-24 MED ORDER — AZITHROMYCIN 250 MG PO TABS: ORAL_TABLET | ORAL | 0 refills | Status: AC

## 2021-06-24 MED ORDER — BENZONATATE 200 MG PO CAPS
200.0000 mg | ORAL_CAPSULE | Freq: Two times a day (BID) | ORAL | 0 refills | Status: DC | PRN
Start: 2021-06-24 — End: 2021-07-04

## 2021-06-24 NOTE — Progress Notes (Signed)
Established Patient Office Visit  Subjective:  Patient ID: Mason [name], male    DOB: [birth year]  Age: 80 y.o. MRN: [id number]  CC:  Chief Complaint  Patient presents with   Cough    Patient states for about 3 days he has been having chills, sore throat, coughing, weak. Pt states he has took many otc medications    HPI CLARNCE HOMAN presents for Cough  Onset three days ago Improved today since yesterday. Some sore throat, weakness, chills, productive cough Has tried multiple OTC medications without relief.  Some diarrhea - fairly consistent. No blood or mucus  Denies vomiting, nausea, sensory changes, doe, chest pain   Hx of diabetes   No sick contacts  Past Medical History:  Diagnosis Date   AICD (automatic cardioverter/defibrillator) present 2018   MDT CRT-D.  Fatigue-->completely pacer dependent.  Pacer settings adjusted 12/2017 to allow more chronotrophc variance with ADL's//exertion.   Arthritis    Pt is s/p eft reverse total shoulder arthroplasty.   Balanitis    chronic fungal   BPH (benign prostatic hypertrophy) 06/2011   Irritative sx's; pt declined trial of anticholinergic per Urology records   Chronic combined systolic and diastolic heart failure ([place]) 05/31/2012   Nonischemic:  EF 40-45%, LA mod-severe dilated, AFIB.   02/2016 EF 40%, diffuse hypokinesis, grade 2 DD.  Myoc perf imaging showed EF 32% 04/2016.  Pt upgraded to CRT-D 01/04/17.   Chronic renal insufficiency, stage III (moderate) (HCC) 2015   CrCl about 60 ml/min   Complete heart block (HCC)    Has dual chamber pacer.   COVID-19 virus infection 01/05/2021   paxlovid   Depression    DOE (dyspnea on exertion)    NYHA class II/III CHF   Dyspnea 2021   with exertion, bending over   Episodic low back pain 01/22/2013   w/intermittent radiculitis (12/2014 his neurologist referred him to pain mgmt for epidural steroid injection)   Erectile dysfunction 2019   due to zoloft--urol rx'd viagra   GERD  (gastroesophageal reflux disease)    H/O tilt table evaluation 53/61/4431   negative   Helicobacter pylori gastritis 01/2016   History of adenomatous polyp of colon 10/12/2011   Dr. [name] (3 right side of colon- tubular adenomas removed)   History of cardiovascular stress test 05/28/2012   no ischemia, EF 37%, imaging results are unchanged and within normal variance   History of chronic prostatitis    History of kidney stones    History of vertigo    + Hx of posterior HA's.  Neuro (Dr. Erling Cruz) eval 2011.  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   Hyperlipidemia    Hypertension    Lumbar spondylosis    lumbosacral radiculopathy at L4 by EMG testing, right foot drop (neurologist is Dr. Linus Salmons with [facility] in [place])--neurologist referred him to neurosurgery   Migraine    "used to have them all the time; none for years" (01/04/2017)   Myocardial infarction [facility]) ?1970s   not entirely certain of this   Nephrolithiasis 07/2012   Left UVJ 2 mm stone with dilation of renal collecting system and slight hydroureter on right   Neuropathy    NICM (nonischemic cardiomyopathy) ([place])    a. 02/2018 Cath: LM nl, LAD min irregs, LCX no, RCA 20d. [id number]. Fick CO/CI 4.4/2.0.   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  Since no CAD  on cath 05/2016, cards recommends upgrade to CRT-D.   Permanent atrial fibrillation ([place])    DCCV 07/09/13-converted, lasted two days, then back into afib--needs lifetime anticoagulation (Xarelto as of 09/2014)   Prostate cancer screening 09/2017   done by urol annually (normal prostate exam documented + PSA 0.84 as of 10/01/17 urol f/u.  10/2018 urol f/u PSA 0.6, no prostate nodule.   Rectus diastasis    Right ankle sprain 08/2017   w/distal fibula avulsion fx noted on u/s but not plain film-(Dr. Hudnall).   Skin cancer of arm,  left    "burned it off" (01/04/2017)   TIA (transient ischemic attack)    L face and L arm weakness. Peri procedural->a. 03/22/2018 following cath. CT head neg. No MRI b/c has pacer. Likely due to embolus to distal branch of RMCA   Type II diabetes mellitus ([place])     Past Surgical History:  Procedure Laterality Date   ABI's Bilateral 05/21/2018   normal   BACK SURGERY     BIV ICD INSERTION CRT-D N/A 01/04/2017   Procedure: BiV ICD ;  Surgeon: Constance Haw, MD;  Location: [facility];  Service: Cardiovascular;  Laterality: N/A;   CARDIAC CATHETERIZATION N/A 06/14/2016   Minimal nonobstructive dz, EF 25-35%.  Procedure: Left Heart Cath and Coronary Angiography;  Surgeon: Peter M Martinique, MD;  Location: [place] [facility];  Service: Cardiovascular;  Laterality: N/A;   CARDIOVASCULAR STRESS TEST  2012   2012 nuclear perfusion study: low risk scan; 04/2016 normal myocardial perfusion imaging, EF 32%.   CARDIOVERSION  07/09/2012   Procedure: CARDIOVERSION;  Surgeon: [name], MD;  Location: [facility];  Service: Cardiovascular;  Laterality: N/A;   CATARACT EXTRACTION W/ INTRAOCULAR LENS IMPLANT & ANTERIOR VITRECTOMY, BILATERAL Bilateral    CIRCUMCISION N/A 03/23/2020   Procedure: CIRCUMCISION ADULT;  Surgeon: Remi Haggard, MD;  Location: [facility];  Service: Urology;  Laterality: N/A;   COLONOSCOPY W/ POLYPECTOMY  approx 2006; repeated 09/2011   Polyps on 2013 EGD as well, repeat 12/2014   ESOPHAGOGASTRODUODENOSCOPY  10/18/06   Done due to chronic GERD: Normal, bx showed no barrett's esophagus (Dr. [name])   [place] Left 10/02/2016   Procedure: LEFT RING FINGER WOUND EXPORATION AND FLEXOR TENDON REPAIR AND NERVE REPAIR;  Surgeon: Milly Jakob, MD;  Location: [facility];  Service: Orthopedics;  Laterality: Left;   INSERT / REPLACE / REMOVE PACEMAKER  02/05/2012   dual chamber, sinus node dysfunction, sinus arrest, PAF, Medtronic Revo serial#-[id number]: last checked  05/2015   LUMBAR LAMINECTOMY Left 1976   L4-5   PACEMAKER REMOVAL  01/04/2017   PERMANENT PACEMAKER INSERTION N/A 02/05/2012   Procedure: PERMANENT PACEMAKER INSERTION;  Surgeon: [name], MD; Generator Medtronic [place] model [place] serial number [id number] H Laterality: N/A;   RETINAL DETACHMENT SURGERY Left ~ [place] Left 2018   Left shoulder reverse [name] in [place]).   RIGHT/LEFT HEART CATH AND CORONARY ANGIOGRAPHY N/A 03/22/2018   EF 30-35%, no CAD.  Procedure: RIGHT/LEFT HEART CATH AND CORONARY ANGIOGRAPHY;  Surgeon: Jolaine Artist, MD;  Location: [place] [facility];  Service: Cardiovascular;  Laterality: N/A;   TRANSTHORACIC ECHOCARDIOGRAM  08/25/10; 05/2012; 03/23/16;12/2017   mild asymmetric LVH, normal systolic function, normal diastolic fxn, mild-to-mod mitral regurg, mild aortic valve sclerosis and trace AI, mild aortic root dilatation. 2014 f/u showed EF 40-45%, mod LAE, A FIB.  02/2016 EF 40%, diffuse hypokinesis, grade 2 DD. 12/2017 EF  35-40%,diffuse hypokin,grd III DD, mild MR   WISDOM TOOTH EXTRACTION      Family History  Problem Relation Age of Onset   Heart failure Mother    Stroke Mother    Stroke Father    Heart disease Sister    Heart disease Sister    Cancer Sister        liver   Cancer Brother        lung   Cancer Brother        lung   Heart disease Brother     Social History   Socioeconomic History   Marital status: Married    Spouse name: Not on file   Number of children: Not on file   Years of education: Not on file   Highest education level: Not on file  Occupational History   Not on file  Tobacco Use   Smoking status: Never   Smokeless tobacco: Never  Vaping Use   Vaping Use: Never used  Substance and Sexual Activity   Alcohol use: Yes    Comment: occ   Drug use: No   Sexual activity: Not on file  Other Topics Concern   Not on file  Social History Narrative   Not on file   Social  Determinants of Health   Financial Resource Strain: Low Risk    Difficulty of Paying Living Expenses: Not hard at all  Food Insecurity: No Food Insecurity   Worried About Charity fundraiser in the Last Year: Never true   [place] in the Last Year: Never true  Transportation Needs: No Transportation Needs   Lack of Transportation (Medical): No   Lack of Transportation (Non-Medical): No  Physical Activity: Sufficiently Active   Days of Exercise per Week: 7 days   Minutes of Exercise per Session: 30 min  Stress: No Stress Concern Present   Feeling of Stress : Not at all  Social Connections: Moderately Integrated   Frequency of Communication with Friends and Family: More than three times a week   Frequency of Social Gatherings with Friends and Family: More than three times a week   Attends Religious Services: Never   Marine scientist or Organizations: Yes   Attends Music therapist: More than 4 times per year   Marital Status: Married  Human resources officer Violence: Not At Risk   Fear of Current or Ex-Partner: No   Emotionally Abused: No   Physically Abused: No   Sexually Abused: No    Outpatient Medications Prior to Visit  Medication Sig Dispense Refill   acetaminophen (TYLENOL) 325 MG tablet Take 650 mg by mouth every 6 (six) hours as needed for moderate pain.     BD INSULIN SYRINGE U/F 31G X 5/16" 1 ML MISC USE DAILY AS DIRECTED 100 each 3   COMBIGAN 0.2-0.5 % ophthalmic solution Place 1 drop into both eyes at bedtime.      Continuous Blood Gluc Sensor (FREESTYLE LIBRE 14 DAY SENSOR) MISC Check glucose 4 times per day 6 each 3   diclofenac Sodium (VOLTAREN) 1 % GEL Apply 4 g topically 4 (four) times daily. 100 g 1   Dulaglutide (TRULICITY) 3 ZO/1.0RU SOPN Inject 3 mg into the skin once a week. 6 mL 3   empagliflozin (JARDIANCE) 10 MG TABS tablet Take 1 tablet (10 mg total) by mouth daily. **Need appointment before anymore refills given** 90 tablet 0    ENTRESTO 97-103 MG TAKE 1 TABLET TWO TIMES A  DAY 180 tablet 3   eplerenone (INSPRA) 25 MG tablet Take 1 tablet (25 mg total) by mouth daily. 90 tablet 3   furosemide (LASIX) 20 MG tablet Take 1 tablet (20 mg total) by mouth daily as needed for edema (For a weight gain of 3 pounds overnight or 5 pounds in one week). 30 tablet 5   gabapentin (NEURONTIN) 300 MG capsule TAKE 2 CAPSULES TWICE A DAY 360 capsule 3   insulin aspart (NOVOLOG FLEXPEN) 100 UNIT/ML FlexPen INJECT 6-8 UNITS UNDER THE SKIN AT THE TIME OF EACH MEAL 15 mL 4   insulin glargine (LANTUS) 100 UNIT/ML injection Inject 0.36-0.4 mLs (36-40 Units total) into the skin daily. INJECT 54 UNITS UNDER THE SKIN DAILY, WILL BE TITRATING DOSE PERIODICALLY 30 mL 3   Insulin Pen Needle (SURE COMFORT PEN NEEDLES) 31G X 5 MM MISC USE TO INJECT INSULIN UNDER THE SKIN 100 each 11   ketorolac (ACULAR) 0.5 % ophthalmic solution Place 1 drop into the left eye 2 (two) times daily. 5 mL 1   latanoprost (XALATAN) 0.005 % ophthalmic solution Place 1 drop into both eyes at bedtime.   12   lidocaine (LIDODERM) 5 % Place 1 patch onto the skin daily. Remove & Discard patch within 12 hours or as directed by MD 30 patch 0   meclizine (ANTIVERT) 25 MG tablet TAKE 1 TABLET THREE TIMES A DAY AS NEEDED FOR DIZZINESS 270 tablet 3   metoprolol succinate (TOPROL-XL) 50 MG 24 hr tablet TAKE 1 TABLET DAILY. TAKE WITH OR IMMEDIATELY FOLLOWING A MEAL (Patient taking differently: Take by mouth daily. Pt taking 1/2 tablet (25 mg) daily) 90 tablet 0   omeprazole (PRILOSEC) 40 MG capsule Take 1 capsule (40 mg total) by mouth daily. 90 capsule 1   oxybutynin (DITROPAN-XL) 5 MG 24 hr tablet TAKE 1 TABLET AT BEDTIME 90 tablet 3   rOPINIRole (REQUIP) 1 MG tablet TAKE 1 TABLET DAILY 90 tablet 3   rosuvastatin (CRESTOR) 20 MG tablet Take 1 tablet (20 mg total) by mouth daily. 90 tablet 3   sertraline (ZOLOFT) 100 MG tablet TAKE 1 TABLET DAILY 90 tablet 0   XARELTO 20 MG TABS tablet TAKE  1 TABLET DAILY WITH SUPPER (Patient taking differently: Take 20 mg by mouth daily with supper.) 90 tablet 3   Continuous Blood Gluc Sensor (FREESTYLE LIBRE SENSOR SYSTEM) MISC USE TO CHECK BLOOD SUGARS 4 TIMES DAILY. Before breakfast, before lunch, before supper, and at bedtime 1 each 3   oxyCODONE (OXY IR/ROXICODONE) 5 MG immediate release tablet 1-2 tabs po q6h prn pain 20 tablet 0   predniSONE (DELTASONE) 20 MG tablet 2 tabs po qd x 5d 10 tablet 0   No facility-administered medications prior to visit.    No Known Allergies  ROS Review of Systems  Constitutional: Negative.   HENT: Negative.    Eyes: Negative.   Respiratory:  Positive for cough, shortness of breath and wheezing. Negative for apnea, choking, chest tightness and stridor.   Cardiovascular: Negative.   Gastrointestinal: Negative.   Genitourinary: Negative.   Musculoskeletal: Negative.   Skin: Negative.   Neurological: Negative.   Psychiatric/Behavioral: Negative.    All other systems reviewed and are negative.    Objective:    Physical Exam Constitutional:      General: He is not in acute distress.    Appearance: Normal appearance. He is normal weight. He is not ill-appearing, toxic-appearing or diaphoretic.  Cardiovascular:     Rate and Rhythm: Normal  rate and regular rhythm.     Heart sounds: Normal heart sounds. No murmur heard.   No friction rub. No gallop.  Pulmonary:     Effort: Pulmonary effort is normal. No respiratory distress.     Breath sounds: No stridor. Wheezing present. No rhonchi or rales.     Comments: Decreased breath sounds in lower left lobe Chest:     Chest wall: No tenderness.  Neurological:     General: No focal deficit present.     Mental Status: He is alert and oriented to person, place, and time. Mental status is at baseline.  Psychiatric:        Mood and Affect: Mood normal.        Behavior: Behavior normal.        Thought Content: Thought content normal.        Judgment:  Judgment normal.    BP 104/67   Pulse 80   Temp 98.1 F (36.7 C) (Temporal)   Resp 18   Ht 5\' 11"  (1.803 m)   Wt 234 lb 12.8 oz (106.5 kg)   BMI 32.75 kg/m  Wt Readings from Last 3 Encounters:  06/24/21 234 lb 12.8 oz (106.5 kg)  06/13/21 234 lb 12.8 oz (106.5 kg)  06/09/21 232 lb 3.2 oz (105.3 kg)     Health Maintenance Due  Topic Date Due   Hepatitis C Screening  Never done   COVID-19 Vaccine (4 - Booster) 08/18/2020   Zoster Vaccines- Shingrix (2 of 2) 12/07/2020   INFLUENZA VACCINE  02/21/2021   HEMOGLOBIN A1C  06/11/2021   FOOT EXAM  06/22/2021    There are no preventive care reminders to display for this patient.  Lab Results  Component Value Date   TSH 2.963 04/04/2018   Lab Results  Component Value Date   WBC 5.6 06/09/2021   HGB 16.0 06/09/2021   HCT 49.2 06/09/2021   MCV 92.5 06/09/2021   PLT 129.0 (L) 06/09/2021   Lab Results  Component Value Date   NA 139 06/09/2021   K 4.2 06/09/2021   CO2 32 06/09/2021   GLUCOSE 113 (H) 06/09/2021   BUN 25 (H) 06/09/2021   CREATININE 1.51 (H) 06/09/2021   BILITOT 0.5 10/04/2020   ALKPHOS 84 10/04/2020   AST 15 10/04/2020   ALT 12 10/04/2020   PROT 7.3 10/04/2020   ALBUMIN 4.0 10/04/2020   CALCIUM 9.0 06/09/2021   ANIONGAP 9 03/23/2020   GFR 43.85 (L) 06/09/2021   Lab Results  Component Value Date   CHOL 168 06/22/2020   Lab Results  Component Value Date   HDL 38.80 (L) 06/22/2020   Lab Results  Component Value Date   LDLCALC 85 03/16/2020   Lab Results  Component Value Date   TRIG 215.0 (H) 06/22/2020   Lab Results  Component Value Date   CHOLHDL 4 06/22/2020   Lab Results  Component Value Date   HGBA1C 6.7 (A) 12/09/2020      Assessment & Plan:   Problem List Items Addressed This Visit   None Visit Diagnoses     Flu-like symptoms    -  Primary   Relevant Orders   POCT Influenza A/B (Completed)   POC COVID-19 (Completed)   COVID-19 ruled out by clinical criteria        Cough headache       Relevant Medications   benzonatate (TESSALON) 200 MG capsule   dextromethorphan-guaiFENesin (MUCINEX DM) 30-600 MG 12hr tablet   Other Relevant Orders  POC COVID-19 (Completed)   Wheezing       Relevant Medications   albuterol (VENTOLIN HFA) 108 (90 Base) MCG/ACT inhaler   Lower respiratory infection       Relevant Medications   azithromycin (ZITHROMAX) 250 MG tablet   predniSONE (DELTASONE) 20 MG tablet       Meds ordered this encounter  Medications   azithromycin (ZITHROMAX) 250 MG tablet    Sig: Take 2 tablets on day 1, then 1 tablet daily on days 2 through 5    Dispense:  6 tablet    Refill:  0    Order Specific Question:   Supervising Provider    Answer:   Carlota Raspberry, JEFFREY R [[id number]]   predniSONE (DELTASONE) 20 MG tablet    Sig: Take 1 tablet (20 mg total) by mouth daily with breakfast.    Dispense:  5 tablet    Refill:  0    Order Specific Question:   Supervising Provider    Answer:   Carlota Raspberry, JEFFREY R [[id number]]   benzonatate (TESSALON) 200 MG capsule    Sig: Take 1 capsule (200 mg total) by mouth 2 (two) times daily as needed for cough.    Dispense:  20 capsule    Refill:  0    Order Specific Question:   Supervising Provider    Answer:   Carlota Raspberry, JEFFREY R [[id number]]   dextromethorphan-guaiFENesin (MUCINEX DM) 30-600 MG 12hr tablet    Sig: Take 1 tablet by mouth 2 (two) times daily.    Dispense:  20 tablet    Refill:  0    Order Specific Question:   Supervising Provider    Answer:   Carlota Raspberry, JEFFREY R [[id number]]   albuterol (VENTOLIN HFA) 108 (90 Base) MCG/ACT inhaler    Sig: Inhale 2 puffs into the lungs every 6 (six) hours as needed for wheezing or shortness of breath.    Dispense:  8 g    Refill:  0    Order Specific Question:   Supervising Provider    Answer:   Carlota Raspberry, JEFFREY R [[id number]]    Follow-up: Return if symptoms worsen or fail to improve.   PLAN Lowe rrespiratory wheezing and decreased breath sounds concerning for bacterial infection tx as  above Tessalon, mucinex, and prednisone for relief Discussed risks, benefits, and AE of course of treatment with patient who voices understanding Return if worsening or failing improve. ER precautions reviewed Patient encouraged to call clinic with any questions, comments, or concerns.  Maximiano Coss, NP

## 2021-06-24 NOTE — Patient Instructions (Addendum)
Mason [name] -   Doristine Devoid to see you  Use z pack and prednisone as instructed to treat infection  Use albuterol 1 puff every 4-6 hours for wheezing  Use tessalon and mucinex as instructed as needed for cough and congestion  Call if worsening or failing to improve   Thank you  Rich     If you have lab work done today you will be contacted with your lab results within the next 2 weeks.  If you have not heard from [place] then please contact us. The fastest way to get your results is to register for My Chart.   IF you received an x-ray today, you will receive an invoice from [facility]. Please contact [facility] at [phone] with questions or concerns regarding your invoice.   IF you received labwork today, you will receive an invoice from [facility]. Please contact [facility] at [phone] with questions or concerns regarding your invoice.   Our billing staff will not be able to assist you with questions regarding bills from these companies.  You will be contacted with the lab results as soon as they are available. The fastest way to get your results is to activate your My Chart account. Instructions are located on the last page of this paperwork. If you have not heard from [place] regarding the results in 2 weeks, please contact this office.

## 2021-06-27 HISTORY — PX: WISDOM TOOTH EXTRACTION: SHX21

## 2021-06-28 ENCOUNTER — Encounter (HOSPITAL_COMMUNITY): Payer: Self-pay | Admitting: Gastroenterology

## 2021-06-29 ENCOUNTER — Other Ambulatory Visit: Payer: Self-pay

## 2021-06-29 ENCOUNTER — Encounter: Payer: Self-pay | Admitting: Family Medicine

## 2021-06-29 ENCOUNTER — Ambulatory Visit (INDEPENDENT_AMBULATORY_CARE_PROVIDER_SITE_OTHER): Payer: Medicare Other

## 2021-06-29 DIAGNOSIS — I5022 Chronic systolic (congestive) heart failure: Secondary | ICD-10-CM

## 2021-06-29 LAB — ECHOCARDIOGRAM COMPLETE
AR max vel: 2.48 cm2
AV Area VTI: 2.37 cm2
AV Area mean vel: 2.25 cm2
AV Mean grad: 3 mmHg
AV Peak grad: 4.2 mmHg
Ao pk vel: 1.02 m/s
Area-P 1/2: 3.6 cm2
Calc EF: 59.3 %
S' Lateral: 2.89 cm
Single Plane A2C EF: 56.9 %
Single Plane A4C EF: 58.9 %

## 2021-07-05 ENCOUNTER — Other Ambulatory Visit: Payer: Self-pay | Admitting: Family Medicine

## 2021-07-07 NOTE — Anesthesia Preprocedure Evaluation (Addendum)
Anesthesia Evaluation  Patient identified by MRN, date of birth, ID band Patient awake    Reviewed: Allergy & Precautions, NPO status , Patient's Chart, lab work & pertinent test results, reviewed documented beta blocker date and time   Airway Mallampati: II  TM Distance: >3 FB Neck ROM: Full    Dental no notable dental hx. (+) Teeth Intact, Dental Advisory Given   Pulmonary neg pulmonary ROS,    Pulmonary exam normal breath sounds clear to auscultation       Cardiovascular hypertension, Pt. on home beta blockers and Pt. on medications + Past MI and +CHF  Normal cardiovascular exam+ dysrhythmias Atrial Fibrillation + pacemaker + Cardiac Defibrillator  Rhythm:Regular Rate:Normal  TTE 2022 1. Left ventricular ejection fraction, by estimation, is 50 to 55%. Left  ventricular ejection fraction by 3D volume is 53 %. The left ventricle has  low normal function. The left ventricle has no regional wall motion  abnormalities. Left ventricular  diastolic parameters are indeterminate. The average left ventricular  global longitudinal strain is -9.1 %. The global longitudinal strain is  abnormal.  2. Right ventricular systolic function is mildly reduced. The right  ventricular size is mildly enlarged.  3. Left atrial size was severely dilated.  4. Right atrial size was severely dilated.  5. The mitral valve is normal in structure. Mild mitral valve  regurgitation.  6. The aortic valve is tricuspid. Aortic valve regurgitation is trivial.  7. Aortic dilatation noted. 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 There is mild dilatation of  the ascending aorta, measuring 41 mm.   LHC 2019 1. Minimal CAD 2. 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 Well-compensated filling pressures with moderately reduced CO 4. Complicated radial access bilaterally as described above. Suggest femoral approach for any  further catheterizations    Neuro/Psych  Headaches, PSYCHIATRIC DISORDERS Depression TIA   GI/Hepatic Neg liver ROS, GERD  ,  Endo/Other  diabetes, Type 2, Insulin Dependent  Renal/GU Renal InsufficiencyRenal disease  negative genitourinary   Musculoskeletal negative musculoskeletal ROS (+)   Abdominal   Peds  Hematology  (+) Blood dyscrasia (on xarelto), ,   Anesthesia Other Findings   Reproductive/Obstetrics                            Anesthesia Physical Anesthesia Plan  ASA: 3  Anesthesia Plan: MAC   Post-op Pain Management:    Induction: Intravenous  PONV Risk Score and Plan: Propofol infusion and Treatment may vary due to age or medical condition  Airway Management Planned: Natural Airway  Additional Equipment:   Intra-op Plan:   Post-operative Plan:   Informed Consent: I have reviewed the patients History and Physical, chart, labs and discussed the procedure including the risks, benefits and alternatives for the proposed anesthesia with the patient or authorized representative who has indicated his/her understanding and acceptance.     Dental advisory given  Plan Discussed with: CRNA  Anesthesia Plan Comments:         Anesthesia Quick Evaluation

## 2021-07-08 ENCOUNTER — Ambulatory Visit (HOSPITAL_COMMUNITY)
Admission: RE | Admit: 2021-07-08 | Discharge: 2021-07-08 | Disposition: A | Discharge status: Home / Self Care | Payer: Medicare Other | Attending: Gastroenterology | Admitting: Gastroenterology

## 2021-07-08 ENCOUNTER — Ambulatory Visit (HOSPITAL_COMMUNITY): Payer: Medicare Other | Admitting: Certified Registered Nurse Anesthetist

## 2021-07-08 ENCOUNTER — Encounter (HOSPITAL_COMMUNITY): Payer: Self-pay | Admitting: Gastroenterology

## 2021-07-08 ENCOUNTER — Other Ambulatory Visit: Payer: Self-pay

## 2021-07-08 ENCOUNTER — Encounter (HOSPITAL_COMMUNITY)
Admission: RE | Disposition: A | Discharge status: Home / Self Care | Payer: Self-pay | Source: Home / Self Care | Attending: Gastroenterology

## 2021-07-08 DIAGNOSIS — K219 Gastro-esophageal reflux disease without esophagitis: Secondary | ICD-10-CM | POA: Diagnosis not present

## 2021-07-08 DIAGNOSIS — D123 Benign neoplasm of transverse colon: Secondary | ICD-10-CM | POA: Diagnosis not present

## 2021-07-08 DIAGNOSIS — Z8616 Personal history of COVID-19: Secondary | ICD-10-CM | POA: Diagnosis not present

## 2021-07-08 DIAGNOSIS — Z95810 Presence of automatic (implantable) cardiac defibrillator: Secondary | ICD-10-CM | POA: Diagnosis not present

## 2021-07-08 DIAGNOSIS — E1122 Type 2 diabetes mellitus with diabetic chronic kidney disease: Secondary | ICD-10-CM | POA: Insufficient documentation

## 2021-07-08 DIAGNOSIS — I130 Hypertensive heart and chronic kidney disease with heart failure and stage 1 through stage 4 chronic kidney disease, or unspecified chronic kidney disease: Secondary | ICD-10-CM | POA: Insufficient documentation

## 2021-07-08 DIAGNOSIS — Z7901 Long term (current) use of anticoagulants: Secondary | ICD-10-CM | POA: Insufficient documentation

## 2021-07-08 DIAGNOSIS — N183 Chronic kidney disease, stage 3 unspecified: Secondary | ICD-10-CM | POA: Insufficient documentation

## 2021-07-08 DIAGNOSIS — Z1211 Encounter for screening for malignant neoplasm of colon: Secondary | ICD-10-CM | POA: Diagnosis not present

## 2021-07-08 DIAGNOSIS — K635 Polyp of colon: Secondary | ICD-10-CM | POA: Diagnosis not present

## 2021-07-08 DIAGNOSIS — I442 Atrioventricular block, complete: Secondary | ICD-10-CM | POA: Diagnosis not present

## 2021-07-08 DIAGNOSIS — Z800 Family history of malignant neoplasm of digestive organs: Secondary | ICD-10-CM | POA: Diagnosis not present

## 2021-07-08 DIAGNOSIS — F32A Depression, unspecified: Secondary | ICD-10-CM | POA: Insufficient documentation

## 2021-07-08 DIAGNOSIS — I5042 Chronic combined systolic (congestive) and diastolic (congestive) heart failure: Secondary | ICD-10-CM | POA: Insufficient documentation

## 2021-07-08 DIAGNOSIS — Z86010 Personal history of colonic polyps: Secondary | ICD-10-CM | POA: Diagnosis not present

## 2021-07-08 DIAGNOSIS — K5900 Constipation, unspecified: Secondary | ICD-10-CM | POA: Diagnosis not present

## 2021-07-08 DIAGNOSIS — I252 Old myocardial infarction: Secondary | ICD-10-CM | POA: Insufficient documentation

## 2021-07-08 DIAGNOSIS — I4891 Unspecified atrial fibrillation: Secondary | ICD-10-CM | POA: Diagnosis not present

## 2021-07-08 DIAGNOSIS — Z139 Encounter for screening, unspecified: Secondary | ICD-10-CM | POA: Diagnosis not present

## 2021-07-08 DIAGNOSIS — D759 Disease of blood and blood-forming organs, unspecified: Secondary | ICD-10-CM | POA: Diagnosis not present

## 2021-07-08 DIAGNOSIS — Z8673 Personal history of transient ischemic attack (TIA), and cerebral infarction without residual deficits: Secondary | ICD-10-CM | POA: Insufficient documentation

## 2021-07-08 DIAGNOSIS — R519 Headache, unspecified: Secondary | ICD-10-CM | POA: Insufficient documentation

## 2021-07-08 DIAGNOSIS — I4821 Permanent atrial fibrillation: Secondary | ICD-10-CM | POA: Diagnosis not present

## 2021-07-08 HISTORY — PX: POLYPECTOMY: SHX5525

## 2021-07-08 HISTORY — PX: COLONOSCOPY WITH PROPOFOL: SHX5780

## 2021-07-08 SURGERY — COLONOSCOPY WITH PROPOFOL
Anesthesia: Monitor Anesthesia Care

## 2021-07-08 MED ORDER — PROPOFOL 500 MG/50ML IV EMUL
INTRAVENOUS | Status: AC
  Filled 2021-07-08: qty 50

## 2021-07-08 MED ORDER — PROPOFOL 500 MG/50ML IV EMUL
INTRAVENOUS | Status: DC | PRN
  Administered 2021-07-08: 100 ug/kg/min via INTRAVENOUS

## 2021-07-08 MED ORDER — LACTATED RINGERS IV SOLN: INTRAVENOUS | Status: DC | PRN

## 2021-07-08 MED ORDER — SODIUM CHLORIDE 0.9 % IV SOLN: INTRAVENOUS | Status: DC

## 2021-07-08 MED ORDER — PROPOFOL 10 MG/ML IV BOLUS
INTRAVENOUS | Status: DC | PRN
  Administered 2021-07-08: 30 mg via INTRAVENOUS

## 2021-07-08 MED ORDER — PHENYLEPHRINE 40 MCG/ML (10ML) SYRINGE FOR IV PUSH (FOR BLOOD PRESSURE SUPPORT)
PREFILLED_SYRINGE | INTRAVENOUS | Status: DC | PRN
  Administered 2021-07-08: 80 ug via INTRAVENOUS

## 2021-07-08 SURGICAL SUPPLY — 22 items

## 2021-07-08 NOTE — Anesthesia Postprocedure Evaluation (Signed)
Anesthesia Post Note  Patient: Mason [name]  Procedure(s) Performed: COLONOSCOPY WITH PROPOFOL     Patient location during evaluation: Endoscopy Anesthesia Type: MAC Level of consciousness: awake and alert Pain management: pain level controlled Vital Signs Assessment: post-procedure vital signs reviewed and stable Respiratory status: spontaneous breathing, nonlabored ventilation, respiratory function stable and patient connected to nasal cannula oxygen Cardiovascular status: blood pressure returned to baseline and stable Postop Assessment: no apparent nausea or vomiting Anesthetic complications: no   No notable events documented.  Last Vitals:  Vitals:   07/08/21 1120 07/08/21 1124  BP: 103/69 109/68  Pulse: 70 69  Resp: 16 15  Temp:    SpO2: 94% 95%    Last Pain:  Vitals:   07/08/21 1124  TempSrc: Oral  PainSc: 0-No pain                 [name] [name]

## 2021-07-08 NOTE — Transfer of Care (Signed)
Immediate Anesthesia Transfer of Care Note  Patient: Mason [name]  Procedure(s) Performed: COLONOSCOPY WITH PROPOFOL  Patient Location: Endoscopy Unit  Anesthesia Type:MAC  Level of Consciousness: drowsy  Airway & Oxygen Therapy: Patient Spontanous Breathing and Patient connected to face mask  Post-op Assessment: Report given to RN and Post -op Vital signs reviewed and stable  Post vital signs: Reviewed and stable  Last Vitals:  Vitals Value Taken Time  BP 90/62 07/08/21 1107  Temp    Pulse 72 07/08/21 1108  Resp 17 07/08/21 1108  SpO2 100 % 07/08/21 1108  Vitals shown include unvalidated device data.  Last Pain:  Vitals:   07/08/21 1006  TempSrc: Oral  PainSc: 0-No pain         Complications: No notable events documented.

## 2021-07-08 NOTE — H&P (Signed)
Mason [name] HPI: His colonoscopy on 04/13/2015 was positive for a tubular adenoma.  Intermittently he reports having constipation and he uses over-the-counter laxatives with good effects.  The patient does have a family history of colon cancer in a niece who developed the cancer at the age of 64.  The patient is stable with his cardiac status.  He denies any problems with chest pain, SOB, or MI.  His latest A1c was in the low 6% range.   Past Medical History:  Diagnosis Date   AICD (automatic cardioverter/defibrillator) present 2018   MDT CRT-D.  Fatigue-->completely pacer dependent.  Pacer settings adjusted 12/2017 to allow more chronotrophc variance with ADL's//exertion.   Arthritis    Pt is s/p eft reverse total shoulder arthroplasty.   Balanitis    chronic fungal   BPH (benign prostatic hypertrophy) 06/2011   Irritative sx's; pt declined trial of anticholinergic per Urology records   Chronic combined systolic and diastolic heart failure ([place]) 05/31/2012   Nonischemic:  EF 40-45%, LA mod-severe dilated, AFIB.   02/2016 EF 40%, diffuse hypokinesis, grade 2 DD.  Myoc perf imaging showed EF 32% 04/2016.  Pt upgraded to CRT-D 01/04/17.   Chronic renal insufficiency, stage III (moderate) (HCC) 2015   CrCl about 60 ml/min   Complete heart block (HCC)    Has dual chamber pacer.   COVID-19 virus infection 01/05/2021   paxlovid   Depression    DOE (dyspnea on exertion)    NYHA class II/III CHF   Dyspnea 2021   with exertion, bending over   Episodic low back pain 01/22/2013   w/intermittent radiculitis (12/2014 his neurologist referred him to pain mgmt for epidural steroid injection)   Erectile dysfunction 2019   due to zoloft--urol rx'd viagra   GERD (gastroesophageal reflux disease)    H/O tilt table evaluation 03/54/6568   negative   Helicobacter pylori gastritis 01/2016   History of adenomatous polyp of colon 10/12/2011   Dr. [name] (3 right side of colon- tubular adenomas removed)    History of cardiovascular stress test 05/28/2012   no ischemia, EF 37%, imaging results are unchanged and within normal variance   History of chronic prostatitis    History of kidney stones    History of vertigo    + Hx of posterior HA's.  Neuro (Dr. Erling Cruz) eval 2011.  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   Hyperlipidemia    Hypertension    Lumbar spondylosis    lumbosacral radiculopathy at L4 by EMG testing, right foot drop (neurologist is Dr. Linus Salmons with [facility] in [place])--neurologist referred him to neurosurgery   Migraine    "used to have them all the time; none for years" (01/04/2017)   Myocardial infarction [facility]) ?1970s   not entirely certain of this   Nephrolithiasis 07/2012   Left UVJ 2 mm stone with dilation of renal collecting system and slight hydroureter on right   Neuropathy    NICM (nonischemic cardiomyopathy) ([place])    a. 02/2018 Cath: LM nl, LAD min irregs, LCX no, RCA 20d. LEXN17. Fick CO/CI 4.4/2.0.   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  Since no CAD on cath 05/2016, cards recommends upgrade to CRT-D.   Permanent atrial fibrillation ([place])    DCCV 07/09/13-converted, lasted two days, then back into afib--needs lifetime anticoagulation (Xarelto as of 09/2014)   Prostate cancer screening 09/2017   done by [facility]  annually (normal prostate exam documented + PSA 0.84 as of 10/01/17 urol f/u.  10/2018 urol f/u PSA 0.6, no prostate nodule.   Rectus diastasis    Right ankle sprain 08/2017   w/distal fibula avulsion fx noted on u/s but not plain film-(Dr. Hudnall).   Skin cancer of arm, left    "burned it off" (01/04/2017)   TIA (transient ischemic attack)    L face and L arm weakness. Peri procedural->a. 03/22/2018 following cath. CT head neg. No MRI b/c has pacer. Likely due to embolus to distal branch of RMCA   Type II  diabetes mellitus ([place])     Past Surgical History:  Procedure Laterality Date   ABI's Bilateral 05/21/2018   normal   BACK SURGERY     BIV ICD INSERTION CRT-D N/A 01/04/2017   Procedure: BiV ICD ;  Surgeon: Constance Haw, MD;  Location: [place] [facility];  Service: Cardiovascular;  Laterality: N/A;   CARDIAC CATHETERIZATION N/A 06/14/2016   Minimal nonobstructive dz, EF 25-35%.  Procedure: Left Heart Cath and Coronary Angiography;  Surgeon: Peter M Martinique, MD;  Location: [place] [facility];  Service: Cardiovascular;  Laterality: N/A;   CARDIOVASCULAR STRESS TEST  2012   2012 nuclear perfusion study: low risk scan; 04/2016 normal myocardial perfusion imaging, EF 32%.   CARDIOVERSION  07/09/2012   Procedure: CARDIOVERSION;  Surgeon: [name], MD;  Location: [place] [facility];  Service: Cardiovascular;  Laterality: N/A;   CATARACT EXTRACTION W/ INTRAOCULAR LENS IMPLANT & ANTERIOR VITRECTOMY, BILATERAL Bilateral    CIRCUMCISION N/A 03/23/2020   Procedure: CIRCUMCISION ADULT;  Surgeon: Remi Haggard, MD;  Location: [facility];  Service: Urology;  Laterality: N/A;   COLONOSCOPY W/ POLYPECTOMY  approx 2006; repeated 09/2011   Polyps on 2013 EGD as well, repeat 12/2014   ESOPHAGOGASTRODUODENOSCOPY  10/18/2006   Done due to chronic GERD: Normal, bx showed no barrett's esophagus (Dr. [name])   [place] Left 10/02/2016   Procedure: LEFT RING FINGER WOUND EXPORATION AND FLEXOR TENDON REPAIR AND NERVE REPAIR;  Surgeon: Milly Jakob, MD;  Location: [place];  Service: Orthopedics;  Laterality: Left;   INSERT / REPLACE / REMOVE PACEMAKER  02/05/2012   dual chamber, sinus node dysfunction, sinus arrest, PAF, Medtronic Revo serial#[id number] H: last checked 05/2015   LUMBAR LAMINECTOMY Left 1976   L4-5   PACEMAKER REMOVAL  01/04/2017   PERMANENT PACEMAKER INSERTION N/A 02/05/2012   Procedure: PERMANENT PACEMAKER INSERTION;  Surgeon: [name], MD; Generator Medtronic [place] model  [place] serial number [id number] Laterality: N/A;   RETINAL DETACHMENT SURGERY Left ~ [place] Left 2018   Left shoulder reverse [name] in [place]).   RIGHT/LEFT HEART CATH AND CORONARY ANGIOGRAPHY N/A 03/22/2018   EF 30-35%, no CAD.  Procedure: RIGHT/LEFT HEART CATH AND CORONARY ANGIOGRAPHY;  Surgeon: Jolaine Artist, MD;  Location: [place] [facility];  Service: Cardiovascular;  Laterality: N/A;   TRANSTHORACIC ECHOCARDIOGRAM  08/25/10; 05/2012; 03/23/16;12/2017   mild asymmetric LVH, normal systolic function, normal diastolic fxn, mild-to-mod mitral regurg, mild aortic valve sclerosis and trace AI, mild aortic root dilatation. 2014 f/u showed EF 40-45%, mod LAE, A FIB.  02/2016 EF 40%, diffuse hypokinesis, grade 2 DD. 12/2017 EF 35-40%,diffuse hypokin,grd III DD, mild MR   WISDOM TOOTH EXTRACTION  06/27/2021   EF 50-55%, mild aortic root/ascending aorta dilatation (41-42 mm).    Family History  Problem Relation Age of Onset   Heart failure Mother  Stroke Mother    Stroke Father    Heart disease Sister    Heart disease Sister    Cancer Sister        liver   Cancer Brother        lung   Cancer Brother        lung   Heart disease Brother     Social History:  reports that he has never smoked. He has never used smokeless tobacco. He reports current alcohol use. He reports that he does not use drugs.  Allergies: No Known Allergies  Medications: Scheduled: Continuous:  No results found for this or any previous visit (from the past 24 hour(s)).   No results found.  ROS:  As stated above in the HPI otherwise negative.  There were no vitals taken for this visit.    PE: Gen: NAD, Alert and Oriented HEENT:  [place]/AT, EOMI Neck: Supple, no LAD Lungs: CTA Bilaterally CV: RRR without M/G/R ABD: Soft, NTND, +BS Ext: No C/C/E  Assessment/Plan: 1) Screening colonoscopy.  Kristopher Delk D 07/08/2021, 7:22 AM

## 2021-07-08 NOTE — Discharge Instructions (Signed)

## 2021-07-08 NOTE — Op Note (Signed)
[facility] Patient Name: Mason [name] Procedure Date: 07/08/2021 MRN: [id number] Attending MD: Carol Ada , MD Date of Birth: Dec 01, 1940 CSN: [id number] Age: 80 Admit Type: Outpatient Procedure:                Colonoscopy Indications:              High risk colon cancer surveillance: Personal                            history of colonic polyps Providers:                Carol Ada, MD, Mikey College, RN, Cherylynn Ridges, Technician, Dellie Catholic Referring MD:              Medicines:                 Complications:            No immediate complications. Estimated Blood Loss:     Estimated blood loss: none. Procedure:                Pre-Anesthesia Assessment:                           - Prior to the procedure, a History and Physical                            was performed, and patient medications and                            allergies were reviewed. The patient's tolerance of                            previous anesthesia was also reviewed. The risks                            and benefits of the procedure and the sedation                            options and risks were discussed with the patient.                            All questions were answered, and informed consent                            was obtained. Prior Anticoagulants: The patient has                            taken Xarelto (rivaroxaban), last dose was 2 days                            prior to procedure. ASA Grade Assessment: III - A  patient with severe systemic disease. After                            reviewing the risks and benefits, the patient was                            deemed in satisfactory condition to undergo the                            procedure.                           - Sedation was administered by an anesthesia                            professional. Deep sedation was attained.                           After obtaining  informed consent, the colonoscope                            was passed under direct vision. Throughout the                            procedure, the patient's blood pressure, pulse, and                            oxygen saturations were monitored continuously. The                            [id number] ([id number] Olympus colonoscope was                            introduced through the anus and advanced to the the                            cecum, identified by appendiceal orifice and                            ileocecal valve. The colonoscopy was performed                            without difficulty. The patient tolerated the                            procedure well. The quality of the bowel                            preparation was evaluated using the BBPS [facility]                            Bowel Preparation Scale) with scores of: Right                            Colon =  3, Transverse Colon = 3 and Left Colon = 3                            (entire mucosa seen well with no residual staining,                            small fragments of stool or opaque liquid). The                            total BBPS score equals 9. The ileocecal valve,                            appendiceal orifice, and rectum were photographed. Scope In: 10:44:22 AM Scope Out: 11:00:35 AM Scope Withdrawal Time: 0 hours 12 minutes 59 seconds  Total Procedure Duration: 0 hours 16 minutes 13 seconds  Findings:      Four sessile polyps were found in the transverse colon. The polyps were       3 to 4 mm in size. These polyps were removed with a cold snare.       Resection and retrieval were complete. Impression:               - Four 3 to 4 mm polyps in the transverse colon,                            removed with a cold snare. Resected and retrieved. Moderate Sedation:      Not Applicable - Patient had care per Anesthesia. Recommendation:           - Patient has a contact number available for                             emergencies. The signs and symptoms of potential                            delayed complications were discussed with the                            patient. Return to normal activities tomorrow.                            Written discharge instructions were provided to the                            patient.                           - Resume previous diet.                           - Continue present medications.                           - Await pathology results.                           -  Repeat colonoscopy in 5 years for surveillance,                            if clinically appropriate.                           - Resume Xarelto today. Procedure Code(s):        --- Professional ---                           [phone], Colonoscopy, flexible; with removal of                            tumor(s), polyp(s), or other lesion(s) by snare                            technique Diagnosis Code(s):        --- Professional ---                           K63.5, Polyp of colon                           Z86.010, Personal history of colonic polyps CPT copyright 2019 [employer]. All rights reserved. The codes documented in this report are preliminary and upon coder review may  be revised to meet current compliance requirements. Carol Ada, MD Carol Ada, MD 07/08/2021 11:07:09 AM This report has been signed electronically. Number of Addenda: 0

## 2021-07-11 ENCOUNTER — Encounter: Payer: Self-pay | Admitting: Family Medicine

## 2021-07-11 LAB — SURGICAL PATHOLOGY

## 2021-07-24 ENCOUNTER — Other Ambulatory Visit: Payer: Self-pay | Admitting: Family Medicine

## 2021-07-27 ENCOUNTER — Ambulatory Visit (INDEPENDENT_AMBULATORY_CARE_PROVIDER_SITE_OTHER): Payer: Medicare Other

## 2021-07-27 DIAGNOSIS — I442 Atrioventricular block, complete: Secondary | ICD-10-CM

## 2021-07-27 LAB — CUP PACEART REMOTE DEVICE CHECK
Battery Remaining Longevity: 26 mo
Battery Voltage: 2.94 V
Brady Statistic AP VP Percent: 0 %
Brady Statistic AP VS Percent: 0 %
Brady Statistic AS VP Percent: 0 %
Brady Statistic AS VS Percent: 0 %
Brady Statistic RA Percent Paced: 0 %
Brady Statistic RV Percent Paced: 99.9 %
Date Time Interrogation Session: 20230104022604
HighPow Impedance: 70 Ohm
Implantable Lead Implant Date: 20130715
Implantable Lead Implant Date: 20180614
Implantable Lead Implant Date: 20180614
Implantable Lead Location: 753858
Implantable Lead Location: 753859
Implantable Lead Location: 753860
Implantable Lead Model: 4598
Implantable Pulse Generator Implant Date: 20180614
Lead Channel Impedance Value: 193.707
Lead Channel Impedance Value: 193.707
Lead Channel Impedance Value: 215.064
Lead Channel Impedance Value: 234.08 Ohm
Lead Channel Impedance Value: 234.08 Ohm
Lead Channel Impedance Value: 342 Ohm
Lead Channel Impedance Value: 361 Ohm
Lead Channel Impedance Value: 418 Ohm
Lead Channel Impedance Value: 418 Ohm
Lead Channel Impedance Value: 456 Ohm
Lead Channel Impedance Value: 532 Ohm
Lead Channel Impedance Value: 532 Ohm
Lead Channel Impedance Value: 532 Ohm
Lead Channel Impedance Value: 665 Ohm
Lead Channel Impedance Value: 703 Ohm
Lead Channel Impedance Value: 817 Ohm
Lead Channel Impedance Value: 836 Ohm
Lead Channel Impedance Value: 836 Ohm
Lead Channel Pacing Threshold Amplitude: 0.375 V
Lead Channel Pacing Threshold Amplitude: 0.5 V
Lead Channel Pacing Threshold Pulse Width: 0.4 ms
Lead Channel Pacing Threshold Pulse Width: 1 ms
Lead Channel Sensing Intrinsic Amplitude: 0.625 mV
Lead Channel Sensing Intrinsic Amplitude: 12.625 mV
Lead Channel Sensing Intrinsic Amplitude: 12.625 mV
Lead Channel Setting Pacing Amplitude: 1 V
Lead Channel Setting Pacing Amplitude: 2 V
Lead Channel Setting Pacing Pulse Width: 0.4 ms
Lead Channel Setting Pacing Pulse Width: 1 ms
Lead Channel Setting Sensing Sensitivity: 0.3 mV

## 2021-08-03 ENCOUNTER — Other Ambulatory Visit: Payer: Self-pay | Admitting: Cardiovascular Disease

## 2021-08-03 ENCOUNTER — Other Ambulatory Visit: Payer: Self-pay | Admitting: Family Medicine

## 2021-08-03 NOTE — Telephone Encounter (Signed)
LM for pt to returncall

## 2021-08-04 ENCOUNTER — Other Ambulatory Visit: Payer: Self-pay

## 2021-08-04 MED ORDER — FUROSEMIDE 20 MG PO TABS: 20.0000 mg | ORAL_TABLET | Freq: Every day | ORAL | 5 refills | Status: DC | PRN

## 2021-08-04 NOTE — Telephone Encounter (Addendum)
Appt scheduled for 08/09/21

## 2021-08-08 NOTE — Progress Notes (Signed)
Remote ICD transmission.   

## 2021-08-09 ENCOUNTER — Ambulatory Visit (INDEPENDENT_AMBULATORY_CARE_PROVIDER_SITE_OTHER): Payer: Medicare Other | Admitting: Family Medicine

## 2021-08-09 ENCOUNTER — Encounter: Payer: Self-pay | Admitting: Family Medicine

## 2021-08-09 ENCOUNTER — Other Ambulatory Visit: Payer: Self-pay

## 2021-08-09 VITALS — BP 115/74 | HR 59 | Temp 97.6°F | Ht 71.0 in | Wt 231.6 lb

## 2021-08-09 DIAGNOSIS — N1831 Chronic kidney disease, stage 3a: Secondary | ICD-10-CM | POA: Diagnosis not present

## 2021-08-09 DIAGNOSIS — I10 Essential (primary) hypertension: Secondary | ICD-10-CM

## 2021-08-09 DIAGNOSIS — E7800 Pure hypercholesterolemia, unspecified: Secondary | ICD-10-CM

## 2021-08-09 DIAGNOSIS — N3281 Overactive bladder: Secondary | ICD-10-CM | POA: Diagnosis not present

## 2021-08-09 LAB — COMPREHENSIVE METABOLIC PANEL
ALT: 18 U/L (ref 0–53)
AST: 20 U/L (ref 0–37)
Albumin: 4.3 g/dL (ref 3.5–5.2)
Alkaline Phosphatase: 73 U/L (ref 39–117)
BUN: 25 mg/dL — ABNORMAL HIGH (ref 6–23)
CO2: 27 mEq/L (ref 19–32)
Calcium: 9.2 mg/dL (ref 8.4–10.5)
Chloride: 102 mEq/L (ref 96–112)
Creatinine, Ser: 1.55 mg/dL — ABNORMAL HIGH (ref 0.40–1.50)
GFR: 42.45 mL/min — ABNORMAL LOW (ref >60.00)
Glucose, Bld: 131 mg/dL — ABNORMAL HIGH (ref 70–99)
Potassium: 4.2 mEq/L (ref 3.5–5.1)
Sodium: 138 mEq/L (ref 135–145)
Total Bilirubin: 0.7 mg/dL (ref 0.2–1.2)
Total Protein: 7.4 g/dL (ref 6.0–8.3)

## 2021-08-09 LAB — LIPID PANEL
Cholesterol: 120 mg/dL (ref 0–200)
HDL: 38.3 mg/dL — ABNORMAL LOW (ref >39.00)
LDL Cholesterol: 53 mg/dL (ref 0–99)
NonHDL: 82.16
Total CHOL/HDL Ratio: 3
Triglycerides: 146 mg/dL (ref 0.0–149.0)
VLDL: 29.2 mg/dL (ref 0.0–40.0)

## 2021-08-09 MED ORDER — OXYBUTYNIN CHLORIDE ER 10 MG PO TB24: 10.0000 mg | ORAL_TABLET | Freq: Every day | ORAL | 3 refills | Status: DC

## 2021-08-09 MED ORDER — SERTRALINE HCL 100 MG PO TABS: 100.0000 mg | ORAL_TABLET | Freq: Every day | ORAL | 3 refills | Status: DC

## 2021-08-09 NOTE — Progress Notes (Signed)
OFFICE VISIT  08/09/2021  CC:  Chief Complaint  Patient presents with   Follow-up    RCI; fasting    HPI:    Patient is a 81 y.o. male who presents for 10 month f/u HTN, HLD, CRI III. He has nonischemic CM (syst and diast dysfxn) and has CRT-D, also chronic a-fib and complete heart block (pacer dependent), and consistently has NYHA class II/III sx's. Most recent cardiology f/u 06/13/21: all stable, remains on max medical mgmt + CRT-D. His DM is managed by endocrinology--Dr. Cruzita Lederer. A/P as of last RCI f/u: "1) HTN: bp normal.  Cont entresto, espleronone, and toprol. Lytes/cr today.   2) HLD: tolerating simva long term. When finishes current supply of this med he'll switch over to rosuvastatin 20mg  qd that Dr. Cruzita Lederer rx'd him 75mo ago for more robust effect and possibly better cv protection.     3) CRI III: fluid intake appropriate (CHF), avoids NSAIDs. BMET today.   4) Ischemic CM, chronic a-fib, complete HB: stable on max medical management + CRT.   Continue all current meds and check lytes/cr and cbc today.   5) DM 2: per Dr. Crisoforo Oxford!"  INTERIM HX: Mason [name] is feeling well. Occasional home blood pressure monitoring, always well within normal limits.  He avoids NSAIDs and tries to hydrate well.  He has not noted any improvement in his urge incontinence symptoms since being on Ditropan XL 5 mg a day.  Denies any side effects.  ROS as above, plus--> no fevers, no CP, no SOB, no wheezing, no cough, no dizziness, no HAs, no rashes, no melena/hematochezia.  No polyuria or polydipsia.  No myalgias or arthralgias.  No focal weakness, paresthesias, or tremors.  No acute vision or hearing abnormalities.  No dysuria.  No recent changes in lower legs. No n/v/d or abd pain.  No palpitations.     Past Medical History:  Diagnosis Date   AICD (automatic cardioverter/defibrillator) present 2018   MDT CRT-D.  Fatigue-->completely pacer dependent.  Pacer settings adjusted 12/2017  to allow more chronotrophc variance with ADL's//exertion.   Arthritis    Pt is s/p eft reverse total shoulder arthroplasty.   Balanitis    chronic fungal   BPH (benign prostatic hypertrophy) 06/2011   Irritative sx's; pt declined trial of anticholinergic per Urology records   Chronic combined systolic and diastolic heart failure ([place]) 05/31/2012   Nonischemic:  EF 40-45%, LA mod-severe dilated, AFIB.   02/2016 EF 40%, diffuse hypokinesis, grade 2 DD.  Myoc perf imaging showed EF 32% 04/2016.  Pt upgraded to CRT-D 01/04/17.   Chronic renal insufficiency, stage III (moderate) (HCC) 2015   CrCl about 60 ml/min   Complete heart block (HCC)    Has dual chamber pacer.   COVID-19 virus infection 01/05/2021   paxlovid   Depression    DOE (dyspnea on exertion)    NYHA class II/III CHF   Dyspnea 2021   with exertion, bending over   Episodic low back pain 01/22/2013   w/intermittent radiculitis (12/2014 his neurologist referred him to pain mgmt for epidural steroid injection)   Erectile dysfunction 2019   due to zoloft--urol rx'd viagra   GERD (gastroesophageal reflux disease)    H/O tilt table evaluation 67/06/4579   negative   Helicobacter pylori gastritis 01/2016   History of adenomatous polyp of colon 10/12/2011   Dr. [name] (3 right side of colon- tubular adenomas removed)   History of cardiovascular stress test 05/28/2012   no ischemia, EF 37%, imaging results  are unchanged and within normal variance   History of chronic prostatitis    History of kidney stones    History of vertigo    + Hx of posterior HA's.  Neuro (Dr. Erling Cruz) eval 2011.  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   Hyperlipidemia    Hypertension    Lumbar spondylosis    lumbosacral radiculopathy at L4 by EMG testing, right foot drop (neurologist is Dr. Linus Salmons with [facility] in [place])--neurologist referred him to neurosurgery    Migraine    "used to have them all the time; none for years" (01/04/2017)   Myocardial infarction [facility]) ?1970s   not entirely certain of this   Nephrolithiasis 07/2012   Left UVJ 2 mm stone with dilation of renal collecting system and slight hydroureter on right   Neuropathy    NICM (nonischemic cardiomyopathy) ([place])    a. 02/2018 Cath: LM nl, LAD min irregs, LCX no, RCA 20d. [id number]. Fick CO/CI 4.4/2.0.   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  Since no CAD on cath 05/2016, cards recommends upgrade to CRT-D.   Permanent atrial fibrillation ([place])    DCCV 07/09/13-converted, lasted two days, then back into afib--needs lifetime anticoagulation (Xarelto as of 09/2014)   Prostate cancer screening 09/2017   done by urol annually (normal prostate exam documented + PSA 0.84 as of 10/01/17 urol f/u.  10/2018 urol f/u PSA 0.6, no prostate nodule.   Rectus diastasis    Right ankle sprain 08/2017   w/distal fibula avulsion fx noted on u/s but not plain film-(Dr. Hudnall).   Skin cancer of arm, left    "burned it off" (01/04/2017)   TIA (transient ischemic attack)    L face and L arm weakness. Peri procedural->a. 03/22/2018 following cath. CT head neg. No MRI b/c has pacer. Likely due to embolus to distal branch of RMCA   Type II diabetes mellitus ([place])     Past Surgical History:  Procedure Laterality Date   ABI's Bilateral 05/21/2018   normal   BACK SURGERY     BIV ICD INSERTION CRT-D N/A 01/04/2017   Procedure: BiV ICD ;  Surgeon: Constance Haw, MD;  Location: [place] [facility];  Service: Cardiovascular;  Laterality: N/A;   CARDIAC CATHETERIZATION N/A 06/14/2016   Minimal nonobstructive dz, EF 25-35%.  Procedure: Left Heart Cath and Coronary Angiography;  Surgeon: Peter M Martinique, MD;  Location: [place] [facility];  Service: Cardiovascular;  Laterality: N/A;   CARDIOVASCULAR STRESS TEST  2012   2012 nuclear perfusion study: low risk  scan; 04/2016 normal myocardial perfusion imaging, EF 32%.   CARDIOVERSION  07/09/2012   Procedure: CARDIOVERSION;  Surgeon: [name], MD;  Location: [place] [facility];  Service: Cardiovascular;  Laterality: N/A;   CATARACT EXTRACTION W/ INTRAOCULAR LENS IMPLANT & ANTERIOR VITRECTOMY, BILATERAL Bilateral    CIRCUMCISION N/A 03/23/2020   Procedure: CIRCUMCISION ADULT;  Surgeon: Remi Haggard, MD;  Location: [facility];  Service: Urology;  Laterality: N/A;   COLONOSCOPY W/ POLYPECTOMY  approx 2006; repeated 09/2011   Polyps on 2013 EGD as well, repeat 12/2014   COLONOSCOPY WITH PROPOFOL N/A 07/08/2021   adenoma x 1. Procedure: COLONOSCOPY WITH PROPOFOL;  Surgeon: Carol Ada, MD;  Location: [facility];  Service: Endoscopy;  Laterality: N/A;   ESOPHAGOGASTRODUODENOSCOPY  10/18/2006   Done due to chronic GERD: Normal, bx showed no barrett's esophagus (Dr. [name])  FLEXOR TENDON REPAIR Left 10/02/2016   Procedure: LEFT RING FINGER WOUND EXPORATION AND FLEXOR TENDON REPAIR AND NERVE REPAIR;  Surgeon: Milly Jakob, MD;  Location: [place];  Service: Orthopedics;  Laterality: Left;   INSERT / REPLACE / REMOVE PACEMAKER  02/05/2012   dual chamber, sinus node dysfunction, sinus arrest, PAF, Medtronic Revo serial#-[id number] H: last checked 05/2015   LUMBAR LAMINECTOMY Left 1976   L4-5   PACEMAKER REMOVAL  01/04/2017   PERMANENT PACEMAKER INSERTION N/A 02/05/2012   Procedure: PERMANENT PACEMAKER INSERTION;  Surgeon: [name], MD; Generator Medtronic [place] model [place] serial number [id number] H Laterality: N/A;   POLYPECTOMY  07/08/2021   Procedure: POLYPECTOMY;  Surgeon: Carol Ada, MD;  Location: [facility];  Service: Endoscopy;;   RETINAL DETACHMENT SURGERY Left ~ [place] Left 2018   Left shoulder reverse [name] in [place]).   RIGHT/LEFT HEART CATH AND CORONARY ANGIOGRAPHY N/A 03/22/2018   EF 30-35%, no CAD.  Procedure: RIGHT/LEFT HEART CATH AND  CORONARY ANGIOGRAPHY;  Surgeon: Jolaine Artist, MD;  Location: [place] [facility];  Service: Cardiovascular;  Laterality: N/A;   TRANSTHORACIC ECHOCARDIOGRAM  08/25/10; 05/2012; 03/23/16;12/2017   mild asymmetric LVH, normal systolic function, normal diastolic fxn, mild-to-mod mitral regurg, mild aortic valve sclerosis and trace AI, mild aortic root dilatation. 2014 f/u showed EF 40-45%, mod LAE, A FIB.  02/2016 EF 40%, diffuse hypokinesis, grade 2 DD. 12/2017 EF 35-40%,diffuse hypokin,grd III DD, mild MR   WISDOM TOOTH EXTRACTION  06/27/2021   EF 50-55%, mild aortic root/ascending aorta dilatation (41-42 mm).    Outpatient Medications Prior to Visit  Medication Sig Dispense Refill   acetaminophen (TYLENOL) 325 MG tablet Take 650 mg by mouth every 6 (six) hours as needed for moderate pain.     BD INSULIN SYRINGE U/F 31G X 5/16" 1 ML MISC USE DAILY AS DIRECTED 100 each 3   COMBIGAN 0.2-0.5 % ophthalmic solution Place 1 drop into both eyes at bedtime.      Continuous Blood Gluc Sensor (FREESTYLE LIBRE 14 DAY SENSOR) MISC Check glucose 4 times per day 6 each 3   Continuous Blood Gluc Sensor (FREESTYLE LIBRE SENSOR SYSTEM) MISC USE TO CHECK BLOOD SUGARS 4 TIMES DAILY. Before breakfast, before lunch, before supper, and at bedtime 1 each 3   Dulaglutide (TRULICITY) 3 IO/2.7OJ SOPN Inject 3 mg into the skin once a week. 6 mL 3   empagliflozin (JARDIANCE) 10 MG TABS tablet Take 1 tablet (10 mg total) by mouth daily. **Need appointment before anymore refills given** 90 tablet 0   ENTRESTO 97-103 MG TAKE 1 TABLET TWO TIMES A DAY 180 tablet 3   eplerenone (INSPRA) 25 MG tablet Take 1 tablet (25 mg total) by mouth daily. 90 tablet 3   gabapentin (NEURONTIN) 300 MG capsule TAKE 2 CAPSULES TWICE A DAY (Patient taking differently: Take 1,200 mg by mouth 2 (two) times daily.) 360 capsule 3   insulin aspart (NOVOLOG FLEXPEN) 100 UNIT/ML FlexPen INJECT 6-8 UNITS UNDER THE SKIN AT THE TIME OF EACH MEAL 15 mL 4    insulin glargine (LANTUS) 100 UNIT/ML injection Inject 0.36-0.4 mLs (36-40 Units total) into the skin daily. INJECT 54 UNITS UNDER THE SKIN DAILY, WILL BE TITRATING DOSE PERIODICALLY (Patient taking differently: Inject 8-18 Units into the skin daily. Inject 18 units in the morning 12 units at dinner may take an additional 8 unit if needed) 30 mL 3   Insulin Pen Needle (SURE COMFORT PEN NEEDLES) 31G X  5 MM MISC USE TO INJECT INSULIN UNDER THE SKIN 100 each 11   ketorolac (ACULAR) 0.5 % ophthalmic solution Place 1 drop into the left eye 2 (two) times daily. 5 mL 1   latanoprost (XALATAN) 0.005 % ophthalmic solution Place 1 drop into both eyes at bedtime.   12   lidocaine (LIDODERM) 5 % Place 1 patch onto the skin daily. Remove & Discard patch within 12 hours or as directed by MD (Patient taking differently: Place 1 patch onto the skin daily as needed (pain). Remove & Discard patch within 12 hours or as directed by MD) 30 patch 0   meclizine (ANTIVERT) 25 MG tablet TAKE 1 TABLET THREE TIMES A DAY AS NEEDED FOR DIZZINESS (Patient taking differently: Take 25 mg by mouth 3 (three) times daily.) 270 tablet 3   metoprolol succinate (TOPROL-XL) 50 MG 24 hr tablet TAKE 1 TABLET DAILY. TAKE WITH OR IMMEDIATELY FOLLOWING A MEAL (Patient taking differently: Take 50 mg by mouth daily.) 90 tablet 0   omeprazole (PRILOSEC) 40 MG capsule Take 1 capsule (40 mg total) by mouth daily. 90 capsule 1   rOPINIRole (REQUIP) 1 MG tablet TAKE 1 TABLET DAILY 90 tablet 3   rosuvastatin (CRESTOR) 20 MG tablet Take 1 tablet (20 mg total) by mouth daily. 90 tablet 3   XARELTO 20 MG TABS tablet TAKE 1 TABLET DAILY WITH SUPPER (Patient taking differently: Take 20 mg by mouth daily with supper.) 90 tablet 3   oxybutynin (DITROPAN-XL) 5 MG 24 hr tablet TAKE 1 TABLET AT BEDTIME 90 tablet 0   sertraline (ZOLOFT) 100 MG tablet TAKE 1 TABLET DAILY 90 tablet 0   diclofenac Sodium (VOLTAREN) 1 % GEL Apply 4 g topically 4 (four) times daily.  (Patient not taking: Reported on 07/04/2021) 100 g 1   furosemide (LASIX) 20 MG tablet Take 1 tablet (20 mg total) by mouth daily as needed for edema (For a weight gain of 3 pounds overnight or 5 pounds in one week). (Patient not taking: Reported on 08/09/2021) 30 tablet 5   albuterol (VENTOLIN HFA) 108 (90 Base) MCG/ACT inhaler Inhale 2 puffs into the lungs every 6 (six) hours as needed for wheezing or shortness of breath. (Patient not taking: Reported on 07/04/2021) 8 g 0   dextromethorphan-guaiFENesin (MUCINEX DM) 30-600 MG 12hr tablet Take 1 tablet by mouth 2 (two) times daily. (Patient not taking: Reported on 07/04/2021) 20 tablet 0   No facility-administered medications prior to visit.    No Known Allergies  ROS As per HPI  PE: Vitals with BMI 08/09/2021 07/08/2021 07/08/2021  Height 5\' 11"  - -  Weight 231 lbs 10 oz - -  BMI 70.62 - -  Systolic 376 283 151  Diastolic 74 68 69  Pulse 59 69 70     Physical Exam  General: Alert and well-appearing, oriented x4. Affect is pleasant, lucid thought and speech. Cardiovascular regular rhythm and rate without murmur rub or gallop. Chest is clear, no wheezing or rales. Normal symmetric air entry throughout both lung fields. No chest wall deformities or tenderness. EXT: no clubbing or cyanosis.  no edema.   LABS:  Last CBC Lab Results  Component Value Date   WBC 5.6 06/09/2021   HGB 16.0 06/09/2021   HCT 49.2 06/09/2021   MCV 92.5 06/09/2021   MCH 30.3 03/19/2020   RDW 13.6 06/09/2021   PLT 129.0 (L) 76/16/0737   Last metabolic panel Lab Results  Component Value Date   GLUCOSE 113 (H)  06/09/2021   NA 139 06/09/2021   K 4.2 06/09/2021   CL 100 06/09/2021   CO2 32 06/09/2021   BUN 25 (H) 06/09/2021   CREATININE 1.51 (H) 06/09/2021   GFRNONAA 37 (L) 03/23/2020   CALCIUM 9.0 06/09/2021   PROT 7.3 10/04/2020   ALBUMIN 4.0 10/04/2020   BILITOT 0.5 10/04/2020   ALKPHOS 84 10/04/2020   AST 15 10/04/2020   ALT 12 10/04/2020    ANIONGAP 9 03/23/2020   Last lipids Lab Results  Component Value Date   CHOL 168 06/22/2020   HDL 38.80 (L) 06/22/2020   LDLCALC 85 03/16/2020   LDLDIRECT 103.0 06/22/2020   TRIG 215.0 (H) 06/22/2020   CHOLHDL 4 06/22/2020   Last hemoglobin A1c Lab Results  Component Value Date   HGBA1C 6.7 (A) 12/09/2020   Last thyroid functions Lab Results  Component Value Date   TSH 2.963 04/04/2018   IMPRESSION AND PLAN:  #1 hypertension.  Well-controlled. Cont entresto, espleronone, and toprol. Lytes/cr today.  #2 hypercholesterolemia.  Doing well on Crestor 20 mg a day. Fasting lipid panel and hepatic panel today.  3.  Chronic renal insufficiency stage IIIa. Avoiding NSAIDs.  Trying to hydrate adequately. Electrolytes and creatinine today.  4.  Overactive bladder.  No response to Ditropan XL 5/day.  Increase to 10 mg daily and recheck in 1 month.  #5 diabetes type 2.  Management per Dr. Cruzita Lederer.  He is doing excellent and very happy with this.  An After Visit Summary was printed and given to the patient.  FOLLOW UP: Return in about 4 weeks (around 09/06/2021) for f/u OAB.  Signed:  Crissie Sickles, MD           08/09/2021

## 2021-08-22 ENCOUNTER — Telehealth: Payer: Self-pay

## 2021-08-22 DIAGNOSIS — E1159 Type 2 diabetes mellitus with other circulatory complications: Secondary | ICD-10-CM

## 2021-08-22 MED ORDER — TRULICITY 3 MG/0.5ML ~~LOC~~ SOAJ: 3.0000 mg | SUBCUTANEOUS | 1 refills | Status: DC

## 2021-08-22 NOTE — Telephone Encounter (Signed)
Pt called and left a VM advising [facility] ran out with [facility]. Rx sent to alternative pharmacy on file.

## 2021-08-31 ENCOUNTER — Other Ambulatory Visit: Payer: Self-pay

## 2021-08-31 ENCOUNTER — Ambulatory Visit (INDEPENDENT_AMBULATORY_CARE_PROVIDER_SITE_OTHER): Payer: Medicare Other

## 2021-08-31 ENCOUNTER — Ambulatory Visit: Payer: Medicare Other

## 2021-08-31 VITALS — BP 110/82 | HR 88 | Temp 97.9°F | Wt 233.8 lb

## 2021-08-31 DIAGNOSIS — Z0000 Encounter for general adult medical examination without abnormal findings: Secondary | ICD-10-CM | POA: Diagnosis not present

## 2021-08-31 NOTE — Patient Instructions (Signed)
Mason [name] , Thank you for taking time to come for your Medicare Wellness Visit. I appreciate your ongoing commitment to your health goals. Please review the following plan we discussed and let me know if I can assist you in the future.   Screening recommendations/referrals: Colonoscopy: No longer required  Recommended yearly ophthalmology/optometry visit for glaucoma screening and checkup Recommended yearly dental visit for hygiene and checkup  Vaccinations: Influenza vaccine: postponed 3/23 Pneumococcal vaccine: Up to date Tdap vaccine: Due and discussed  Shingles vaccine: 1st dose 10/12/20   Covid-19: Completed 2/2, 2/23, 06/23/20 & 06/30/21  Advanced directives: Advance directive discussed with you today. Even though you declined this today please call our office should you change your mind and we can give you the proper paperwork for you to fill out.  Conditions/risks identified: lose weight   Next appointment: Follow up in one year for your annual wellness visit.   Preventive Care 79 Years and Older, Male Preventive care refers to lifestyle choices and visits with your health care provider that can promote health and wellness. What does preventive care include? A yearly physical exam. This is also called an annual well check. Dental exams once or twice a year. Routine eye exams. Ask your health care provider how often you should have your eyes checked. Personal lifestyle choices, including: Daily care of your teeth and gums. Regular physical activity. Eating a healthy diet. Avoiding tobacco and drug use. Limiting alcohol use. Practicing safe sex. Taking low doses of aspirin every day. Taking vitamin and mineral supplements as recommended by your health care provider. What happens during an annual well check? The services and screenings done by your health care provider during your annual well check will depend on your age, overall health, lifestyle risk factors, and family  history of disease. Counseling  Your health care provider may ask you questions about your: Alcohol use. Tobacco use. Drug use. Emotional well-being. Home and relationship well-being. Sexual activity. Eating habits. History of falls. Memory and ability to understand (cognition). Work and work Statistician. Screening  You may have the following tests or measurements: Height, weight, and BMI. Blood pressure. Lipid and cholesterol levels. These may be checked every 5 years, or more frequently if you are over 20 years old. Skin check. Lung cancer screening. You may have this screening every year starting at age 60 if you have a 30-pack-year history of smoking and currently smoke or have quit within the past 15 years. Fecal occult blood test (FOBT) of the stool. You may have this test every year starting at age 67. Flexible sigmoidoscopy or colonoscopy. You may have a sigmoidoscopy every 5 years or a colonoscopy every 10 years starting at age 45. Prostate cancer screening. Recommendations will vary depending on your family history and other risks. Hepatitis C blood test. Hepatitis B blood test. Sexually transmitted disease (STD) testing. Diabetes screening. This is done by checking your blood sugar (glucose) after you have not eaten for a while (fasting). You may have this done every 1-3 years. Abdominal aortic aneurysm (AAA) screening. You may need this if you are a current or former smoker. Osteoporosis. You may be screened starting at age 39 if you are at high risk. Talk with your health care provider about your test results, treatment options, and if necessary, the need for more tests. Vaccines  Your health care provider may recommend certain vaccines, such as: Influenza vaccine. This is recommended every year. Tetanus, diphtheria, and acellular pertussis (Tdap, Td) vaccine. You may  need a Td booster every 10 years. Zoster vaccine. You may need this after age 83. Pneumococcal  13-valent conjugate (PCV13) vaccine. One dose is recommended after age 63. Pneumococcal polysaccharide (PPSV23) vaccine. One dose is recommended after age 71. Talk to your health care provider about which screenings and vaccines you need and how often you need them. This information is not intended to replace advice given to you by your health care provider. Make sure you discuss any questions you have with your health care provider. Document Released: 08/06/2015 Document Revised: 03/29/2016 Document Reviewed: 05/11/2015 Elsevier Interactive Patient Education  2017 [place] Prevention in the Home Falls can cause injuries. They can happen to people of all ages. There are many things you can do to make your home safe and to help prevent falls. What can I do on the outside of my home? Regularly fix the edges of walkways and driveways and fix any cracks. Remove anything that might make you trip as you walk through a door, such as a raised step or threshold. Trim any bushes or trees on the path to your home. Use bright outdoor lighting. Clear any walking paths of anything that might make someone trip, such as rocks or tools. Regularly check to see if handrails are loose or broken. Make sure that both sides of any steps have handrails. Any raised decks and porches should have guardrails on the edges. Have any leaves, snow, or ice cleared regularly. Use sand or salt on walking paths during winter. Clean up any spills in your garage right away. This includes oil or grease spills. What can I do in the bathroom? Use night lights. Install grab bars by the toilet and in the tub and shower. Do not use towel bars as grab bars. Use non-skid mats or decals in the tub or shower. If you need to sit down in the shower, use a plastic, non-slip stool. Keep the floor dry. Clean up any water that spills on the floor as soon as it happens. Remove soap buildup in the tub or shower regularly. Attach  bath mats securely with double-sided non-slip rug tape. Do not have throw rugs and other things on the floor that can make you trip. What can I do in the bedroom? Use night lights. Make sure that you have a light by your bed that is easy to reach. Do not use any sheets or blankets that are too big for your bed. They should not hang down onto the floor. Have a firm chair that has side arms. You can use this for support while you get dressed. Do not have throw rugs and other things on the floor that can make you trip. What can I do in the kitchen? Clean up any spills right away. Avoid walking on wet floors. Keep items that you use a lot in easy-to-reach places. If you need to reach something above you, use a strong step stool that has a grab bar. Keep electrical cords out of the way. Do not use floor polish or wax that makes floors slippery. If you must use wax, use non-skid floor wax. Do not have throw rugs and other things on the floor that can make you trip. What can I do with my stairs? Do not leave any items on the stairs. Make sure that there are handrails on both sides of the stairs and use them. Fix handrails that are broken or loose. Make sure that handrails are as long as the stairways.  Check any carpeting to make sure that it is firmly attached to the stairs. Fix any carpet that is loose or worn. Avoid having throw rugs at the top or bottom of the stairs. If you do have throw rugs, attach them to the floor with carpet tape. Make sure that you have a light switch at the top of the stairs and the bottom of the stairs. If you do not have them, ask someone to add them for you. What else can I do to help prevent falls? Wear shoes that: Do not have high heels. Have rubber bottoms. Are comfortable and fit you well. Are closed at the toe. Do not wear sandals. If you use a stepladder: Make sure that it is fully opened. Do not climb a closed stepladder. Make sure that both sides of the  stepladder are locked into place. Ask someone to hold it for you, if possible. Clearly mark and make sure that you can see: Any grab bars or handrails. First and last steps. Where the edge of each step is. Use tools that help you move around (mobility aids) if they are needed. These include: Canes. Walkers. Scooters. Crutches. Turn on the lights when you go into a dark area. Replace any light bulbs as soon as they burn out. Set up your furniture so you have a clear path. Avoid moving your furniture around. If any of your floors are uneven, fix them. If there are any pets around you, be aware of where they are. Review your medicines with your doctor. Some medicines can make you feel dizzy. This can increase your chance of falling. Ask your doctor what other things that you can do to help prevent falls. This information is not intended to replace advice given to you by your health care provider. Make sure you discuss any questions you have with your health care provider. Document Released: 05/06/2009 Document Revised: 12/16/2015 Document Reviewed: 08/14/2014 Elsevier Interactive Patient Education  2017 [employer].

## 2021-08-31 NOTE — Progress Notes (Addendum)
Subjective:   Mason [name] is a 81 y.o. male who presents for Medicare Annual/Subsequent preventive examination.  Review of Systems     Cardiac Risk Factors include: advanced age (>37men, >61 women);hypertension;diabetes mellitus;dyslipidemia;male gender;obesity (BMI >30kg/m2)     Objective:    Today's Vitals   08/31/21 0918  BP: 110/82  Pulse: 88  Temp: 97.9 F (36.6 C)  SpO2: 96%  Weight: 233 lb 12.8 oz (106.1 kg)   Body mass index is 32.61 kg/m.  Advanced Directives 08/31/2021 07/08/2021 12/12/2020 09/03/2020 08/25/2020 03/19/2020 08/22/2019  Does Patient Have a Medical Advance Directive? No No Yes Yes No No No  Type of Advance Directive - - - - - - -  Does patient want to make changes to medical advance directive? - - - - Yes (MAU/Ambulatory/Procedural Areas - Information given) - -  Copy of [place] in Chart? - - - - - - -  Would patient like information on creating a medical advance directive? No - Patient declined No - Patient declined - - - No - Patient declined -  Pre-existing out of facility DNR order (yellow form or pink MOST form) - - - - - - -    Current Medications (verified) Outpatient Encounter Medications as of 08/31/2021  Medication Sig   acetaminophen (TYLENOL) 325 MG tablet Take 650 mg by mouth every 6 (six) hours as needed for moderate pain.   BD INSULIN SYRINGE U/F 31G X 5/16" 1 ML MISC USE DAILY AS DIRECTED   COMBIGAN 0.2-0.5 % ophthalmic solution Place 1 drop into both eyes at bedtime.    Continuous Blood Gluc Sensor (FREESTYLE LIBRE SENSOR SYSTEM) MISC USE TO CHECK BLOOD SUGARS 4 TIMES DAILY. Before breakfast, before lunch, before supper, and at bedtime   diclofenac Sodium (VOLTAREN) 1 % GEL Apply 4 g topically 4 (four) times daily.   Dulaglutide (TRULICITY) 3 YW/7.3XT SOPN Inject 3 mg into the skin once a week.   empagliflozin (JARDIANCE) 10 MG TABS tablet Take 1 tablet (10 mg total) by mouth daily. **Need appointment before anymore  refills given**   ENTRESTO 97-103 MG TAKE 1 TABLET TWO TIMES A DAY   eplerenone (INSPRA) 25 MG tablet Take 1 tablet (25 mg total) by mouth daily.   furosemide (LASIX) 20 MG tablet Take 1 tablet (20 mg total) by mouth daily as needed for edema (For a weight gain of 3 pounds overnight or 5 pounds in one week).   gabapentin (NEURONTIN) 300 MG capsule TAKE 2 CAPSULES TWICE A DAY (Patient taking differently: Take 1,200 mg by mouth 2 (two) times daily.)   insulin aspart (NOVOLOG FLEXPEN) 100 UNIT/ML FlexPen INJECT 6-8 UNITS UNDER THE SKIN AT THE TIME OF EACH MEAL (Patient taking differently: Pt stated 20 units in the a.m only   UNDER THE SKIN AT THE TIME OF EACH MEAL)   insulin glargine (LANTUS) 100 UNIT/ML injection Inject 0.36-0.4 mLs (36-40 Units total) into the skin daily. INJECT 54 UNITS UNDER THE SKIN DAILY, WILL BE TITRATING DOSE PERIODICALLY (Patient taking differently: Inject 8-18 Units into the skin at bedtime. At bedtime 43 units)   Insulin Pen Needle (SURE COMFORT PEN NEEDLES) 31G X 5 MM MISC USE TO INJECT INSULIN UNDER THE SKIN   ketorolac (ACULAR) 0.5 % ophthalmic solution Place 1 drop into the left eye 2 (two) times daily.   latanoprost (XALATAN) 0.005 % ophthalmic solution Place 1 drop into both eyes at bedtime.    lidocaine (LIDODERM) 5 % Place  1 patch onto the skin daily. Remove & Discard patch within 12 hours or as directed by MD (Patient taking differently: Place 1 patch onto the skin daily as needed (pain). Remove & Discard patch within 12 hours or as directed by MD)   meclizine (ANTIVERT) 25 MG tablet TAKE 1 TABLET THREE TIMES A DAY AS NEEDED FOR DIZZINESS (Patient taking differently: Take 25 mg by mouth 3 (three) times daily.)   metoprolol succinate (TOPROL-XL) 50 MG 24 hr tablet TAKE 1 TABLET DAILY. TAKE WITH OR IMMEDIATELY FOLLOWING A MEAL (Patient taking differently: Take 50 mg by mouth daily.)   omeprazole (PRILOSEC) 40 MG capsule Take 1 capsule (40 mg total) by mouth daily.    oxybutynin (DITROPAN XL) 10 MG 24 hr tablet Take 1 tablet (10 mg total) by mouth at bedtime.   rOPINIRole (REQUIP) 1 MG tablet TAKE 1 TABLET DAILY   rosuvastatin (CRESTOR) 20 MG tablet Take 1 tablet (20 mg total) by mouth daily.   sertraline (ZOLOFT) 100 MG tablet Take 1 tablet (100 mg total) by mouth daily.   XARELTO 20 MG TABS tablet TAKE 1 TABLET DAILY WITH SUPPER (Patient taking differently: Take 20 mg by mouth daily with supper.)   Continuous Blood Gluc Sensor (FREESTYLE LIBRE 14 DAY SENSOR) MISC Check glucose 4 times per day   No facility-administered encounter medications on file as of 08/31/2021.    Allergies (verified) Patient has no known allergies.   History: Past Medical History:  Diagnosis Date   AICD (automatic cardioverter/defibrillator) present 2018   MDT CRT-D.  Fatigue-->completely pacer dependent.  Pacer settings adjusted 12/2017 to allow more chronotrophc variance with ADL's//exertion.   Arthritis    Pt is s/p eft reverse total shoulder arthroplasty.   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   02/2016 EF 40%, diffuse hypokinesis, grade 2 DD.  Myoc perf imaging showed EF 32% 04/2016.  Pt upgraded to CRT-D 01/04/17.   Chronic renal insufficiency, stage III (moderate) (HCC) 2015   CrCl about 60 ml/min   Complete heart block (HCC)    Has dual chamber pacer.   COVID-19 virus infection 01/05/2021   paxlovid   Depression    DOE (dyspnea on exertion)    NYHA class II/III CHF   Dyspnea 2021   with exertion, bending over   Episodic low back pain 01/22/2013   w/intermittent radiculitis (12/2014 his neurologist referred him to pain mgmt for epidural steroid injection)   Erectile dysfunction 2019   due to zoloft--urol rx'd viagra   GERD (gastroesophageal reflux disease)    H/O tilt table  evaluation 16/07/930   negative   Helicobacter pylori gastritis 01/2016   History of adenomatous polyp of colon 10/12/2011   Dr. [name] (3 right side of colon- tubular adenomas removed)   History of cardiovascular stress test 05/28/2012   no ischemia, EF 37%, imaging results are unchanged and within normal variance   History of chronic prostatitis    History of kidney stones    History of vertigo    + Hx of posterior HA's.  Neuro (Dr. Erling Cruz) eval 2011.  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   Hyperlipidemia    Hypertension    Lumbar spondylosis    lumbosacral radiculopathy at L4 by EMG testing, right foot drop (neurologist  is Dr. Linus Salmons with [facility] in [place])--neurologist referred him to neurosurgery   Migraine    "used to have them all the time; none for years" (01/04/2017)   Myocardial infarction [facility]) ?1970s   not entirely certain of this   Nephrolithiasis 07/2012   Left UVJ 2 mm stone with dilation of renal collecting system and slight hydroureter on right   Neuropathy    NICM (nonischemic cardiomyopathy) ([place])    a. 02/2018 Cath: LM nl, LAD min irregs, LCX no, RCA 20d. [id number]. Fick CO/CI 4.4/2.0.   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  Since no CAD on cath 05/2016, cards recommends upgrade to CRT-D.   Permanent atrial fibrillation ([place])    DCCV 07/09/13-converted, lasted two days, then back into afib--needs lifetime anticoagulation (Xarelto as of 09/2014)   Prostate cancer screening 09/2017   done by urol annually (normal prostate exam documented + PSA 0.84 as of 10/01/17 urol f/u.  10/2018 urol f/u PSA 0.6, no prostate nodule.   Rectus diastasis    Right ankle sprain 08/2017   w/distal fibula avulsion fx noted on u/s but not plain film-(Dr. Hudnall).   Skin cancer of arm, left    "burned it off" (01/04/2017)   TIA (transient  ischemic attack)    L face and L arm weakness. Peri procedural->a. 03/22/2018 following cath. CT head neg. No MRI b/c has pacer. Likely due to embolus to distal branch of RMCA   Type II diabetes mellitus ([place])    Past Surgical History:  Procedure Laterality Date   ABI's Bilateral 05/21/2018   normal   BACK SURGERY     BIV ICD INSERTION CRT-D N/A 01/04/2017   Procedure: BiV ICD ;  Surgeon: Constance Haw, MD;  Location: [place] [facility];  Service: Cardiovascular;  Laterality: N/A;   CARDIAC CATHETERIZATION N/A 06/14/2016   Minimal nonobstructive dz, EF 25-35%.  Procedure: Left Heart Cath and Coronary Angiography;  Surgeon: Peter M Martinique, MD;  Location: [place] [facility];  Service: Cardiovascular;  Laterality: N/A;   CARDIOVASCULAR STRESS TEST  2012   2012 nuclear perfusion study: low risk scan; 04/2016 normal myocardial perfusion imaging, EF 32%.   CARDIOVERSION  07/09/2012   Procedure: CARDIOVERSION;  Surgeon: [name], MD;  Location: [facility];  Service: Cardiovascular;  Laterality: N/A;   CATARACT EXTRACTION W/ INTRAOCULAR LENS IMPLANT & ANTERIOR VITRECTOMY, BILATERAL Bilateral    CIRCUMCISION N/A 03/23/2020   Procedure: CIRCUMCISION ADULT;  Surgeon: Remi Haggard, MD;  Location: [facility];  Service: Urology;  Laterality: N/A;   COLONOSCOPY W/ POLYPECTOMY  approx 2006; repeated 09/2011   Polyps on 2013 EGD as well, repeat 12/2014   COLONOSCOPY WITH PROPOFOL N/A 07/08/2021   adenoma x 1. Procedure: COLONOSCOPY WITH PROPOFOL;  Surgeon: Carol Ada, MD;  Location: [facility];  Service: Endoscopy;  Laterality: N/A;   ESOPHAGOGASTRODUODENOSCOPY  10/18/2006   Done due to chronic GERD: Normal, bx showed no barrett's esophagus (Dr. [name])   [place] Left 10/02/2016   Procedure: LEFT RING FINGER WOUND EXPORATION AND FLEXOR TENDON REPAIR AND NERVE REPAIR;  Surgeon: Milly Jakob, MD;  Location: [facility];  Service: Orthopedics;  Laterality: Left;   INSERT / REPLACE /  REMOVE PACEMAKER  02/05/2012   dual chamber, sinus node dysfunction, sinus arrest, PAF, Medtronic Revo serial#-[id number] H: last checked 05/2015   LUMBAR LAMINECTOMY Left 1976   L4-5   PACEMAKER REMOVAL  01/04/2017   PERMANENT PACEMAKER INSERTION  N/A 02/05/2012   Procedure: PERMANENT PACEMAKER INSERTION;  Surgeon: [name], MD; Generator Medtronic [place] model [place] serial number [id number] H Laterality: N/A;   POLYPECTOMY  07/08/2021   Procedure: POLYPECTOMY;  Surgeon: Carol Ada, MD;  Location: [facility];  Service: Endoscopy;;   RETINAL DETACHMENT SURGERY Left ~ [place] Left 2018   Left shoulder reverse [name] in [place]).   RIGHT/LEFT HEART CATH AND CORONARY ANGIOGRAPHY N/A 03/22/2018   EF 30-35%, no CAD.  Procedure: RIGHT/LEFT HEART CATH AND CORONARY ANGIOGRAPHY;  Surgeon: Jolaine Artist, MD;  Location: [place] [facility];  Service: Cardiovascular;  Laterality: N/A;   TRANSTHORACIC ECHOCARDIOGRAM  08/25/10; 05/2012; 03/23/16;12/2017   mild asymmetric LVH, normal systolic function, normal diastolic fxn, mild-to-mod mitral regurg, mild aortic valve sclerosis and trace AI, mild aortic root dilatation. 2014 f/u showed EF 40-45%, mod LAE, A FIB.  02/2016 EF 40%, diffuse hypokinesis, grade 2 DD. 12/2017 EF 35-40%,diffuse hypokin,grd III DD, mild MR   WISDOM TOOTH EXTRACTION  06/27/2021   EF 50-55%, mild aortic root/ascending aorta dilatation (41-42 mm).   Family History  Problem Relation Age of Onset   Heart failure Mother    Stroke Mother    Stroke Father    Heart disease Sister    Heart disease Sister    Cancer Sister        liver   Cancer Brother        lung   Cancer Brother        lung   Heart disease Brother    Social History   Socioeconomic History   Marital status: Married    Spouse name: Not on file   Number of children: Not on file   Years of education: Not on file   Highest education level: 12th grade  Occupational  History   Not on file  Tobacco Use   Smoking status: Never   Smokeless tobacco: Never  Vaping Use   Vaping Use: Never used  Substance and Sexual Activity   Alcohol use: Yes    Comment: occ   Drug use: No   Sexual activity: Not on file  Other Topics Concern   Not on file  Social History Narrative   Not on file   Social Determinants of Health   Financial Resource Strain: Low Risk    Difficulty of Paying Living Expenses: Not hard at all  Food Insecurity: No Food Insecurity   Worried About Charity fundraiser in the Last Year: Never true   [place] in the Last Year: Never true  Transportation Needs: No Transportation Needs   Lack of Transportation (Medical): No   Lack of Transportation (Non-Medical): No  Physical Activity: Inactive   Days of Exercise per Week: 0 days   Minutes of Exercise per Session: 0 min  Stress: No Stress Concern Present   Feeling of Stress : Only a little  Social Connections: Engineer, building services of Communication with Friends and Family: More than three times a week   Frequency of Social Gatherings with Friends and Family: More than three times a week   Attends Religious Services: More than 4 times per year   Active Member of [employer] or Organizations: Yes   Attends Archivist Meetings: 1 to 4 times per year   Marital Status: Married    Tobacco Counseling Counseling given: Not Answered   Clinical Intake:  Pre-visit preparation completed: Yes  Pain : No/denies pain  BMI - recorded: 32.61 Nutritional Status: BMI > 30  Obese Nutritional Risks: None Diabetes: Yes CBG done?: Yes (159 in office) CBG resulted in Enter/ Edit results?: No Did pt. bring in CBG monitor from home?: No  How often do you need to have someone help you when you read instructions, pamphlets, or other written materials from your doctor or pharmacy?: 1 - Never  Diabetic?Nutrition Risk Assessment:  Has the patient had any N/V/D within the last  2 months?  No  Does the patient have any non-healing wounds?  No  Has the patient had any unintentional weight loss or weight gain?  No   Diabetes:  Is the patient diabetic?  Yes  If diabetic, was a CBG obtained today?  Yes  Did the patient bring in their glucometer from home?  Yes  How often do you monitor your CBG's? Daily.   Financial Strains and Diabetes Management:  Are you having any financial strains with the device, your supplies or your medication? No .  Does the patient want to be seen by Chronic Care Management for management of their diabetes?  No  Would the patient like to be referred to a Nutritionist or for Diabetic Management?  No   Diabetic Exams:  Diabetic eye exam Completed 04/06/21 Diabetic Foot Exam: Overdue, Pt has been advised about the importance in completing this exam. Pt is scheduled for diabetic foot exam on next appt.   Interpreter Needed?: No  Information entered by :: Miyah Hampshire, LPN   Activities of Daily Living In your present state of health, do you have any difficulty performing the following activities: 08/31/2021  Hearing? Y  Comment pt will wear hearing aids  Vision? N  Difficulty concentrating or making decisions? N  Walking or climbing stairs? N  Dressing or bathing? N  Doing errands, shopping? N  Preparing Food and eating ? N  Using the Toilet? N  In the past six months, have you accidently leaked urine? Y  Comment at times  Do you have problems with loss of bowel control? N  Managing your Medications? N  Managing your Finances? N  Housekeeping or managing your Housekeeping? N  Some recent data might be hidden    Patient Care Team: Tammi Sou, MD as PCP - General (Family Medicine) Constance Haw, MD as PCP - Electrophysiology (Cardiology) Croitoru, Dani Gobble, MD as PCP - Cardiology (Cardiology) Carol Ada, MD as Consulting Physician (Gastroenterology) Kathie Rhodes, MD (Inactive) as Consulting Physician  (Urology) Troy Sine, MD as Consulting Physician (Cardiology) Audery Amel, MD as Referring Physician (Neurology) Clent Jacks, MD as Consulting Physician (Ophthalmology) Lance Muss, DO as Consulting Physician (Orthopedic Surgery) Aleda Grana (Dentistry) Renard Hamper Franchot Gallo, PA-C as Consulting Physician (Physician Assistant)  Indicate any recent Medical Services you may have received from other than [facility] providers in the past year (date may be approximate).     Assessment:   This is a routine wellness examination for Shilo.  Hearing/Vision screen Hearing Screening - Comments:: Pt is getting hearing aids 09/01/21 Vision Screening - Comments:: Pt follows up  with Dr Katy Fitch and Dr Zadie Rhine for annul eye exams   Dietary issues and exercise activities discussed: Current Exercise Habits: The patient does not participate in regular exercise at present   Goals Addressed             This Visit's Progress    Patient Stated       Lose weight  Depression Screen PHQ 2/9 Scores 08/31/2021 06/24/2021 06/09/2021 09/03/2020 08/25/2020 02/26/2019 07/30/2018  PHQ - 2 Score 1 3 0 1 1 0 3  PHQ- 9 Score - 12 - - - 4 12    Fall Risk Fall Risk  08/31/2021 08/09/2021 08/05/2021 06/24/2021 06/09/2021  Falls in the past year? 1 1 1 1  0  Comment - - - - -  Number falls in past yr: 1 1 1  0 0  Injury with Fall? 1 1 0 0 0  Comment hit head - - - -  Risk for fall due to : Impaired vision;Impaired balance/gait History of fall(s) - History of fall(s) -  Follow up Falls prevention discussed Falls evaluation completed - Falls evaluation completed Falls evaluation completed    FALL RISK PREVENTION PERTAINING TO THE HOME:  Any stairs in or around the home? Yes  If so, are there any without handrails? No  Home free of loose throw rugs in walkways, pet beds, electrical cords, etc? Yes  Adequate lighting in your home to reduce risk of falls? Yes   ASSISTIVE DEVICES UTILIZED TO PREVENT  FALLS:  Life alert? No  Use of a cane, walker or w/c? No  Grab bars in the bathroom? Yes  Shower chair or bench in shower? No  Elevated toilet seat or a handicapped toilet? No   TIMED UP AND GO:  Was the test performed? Yes .  Length of time to ambulate 10 feet: 10 sec.   Gait steady and fast without use of assistive device  Cognitive Function: MMSE - Mini Mental State Exam 07/30/2018  Orientation to time 5  Orientation to Place 5  Registration 3  Attention/ Calculation 3  Recall 2  Language- name 2 objects 2  Language- repeat 1  Language- follow 3 step command 3  Language- read & follow direction 1  Write a sentence 1  Copy design 1  Total score 27     6CIT Screen 08/31/2021  What Year? 0 points  What month? 0 points  What time? 0 points  Count back from 20 0 points  Months in reverse 0 points  Repeat phrase 2 points  Total Score 2    Immunizations Immunization History  Administered Date(s) Administered   Fluad Quad(high Dose 65+) 05/19/2019   Influenza, High Dose Seasonal PF 05/26/2013, 05/20/2015, 06/26/2016, 05/11/2017, 04/24/2018, 05/22/2020   Influenza,inj,Quad PF,6+ Mos 06/15/2014   Influenza-Unspecified 05/25/2019   Moderna Covid-19 Vaccine Bivalent Booster 47yrs & up 06/30/2021   Moderna Sars-Covid-2 Vaccination 08/26/2019, 09/16/2019, 06/23/2020   Pneumococcal Conjugate-13 10/15/2014   Pneumococcal Polysaccharide-23 07/24/2010, 10/12/2020   Tdap 08/30/2011   Zoster Recombinat (Shingrix) 10/12/2020    TDAP status: Due, Education has been provided regarding the importance of this vaccine. Advised may receive this vaccine at local pharmacy or Health Dept. Aware to provide a copy of the vaccination record if obtained from local pharmacy or Health Dept. Verbalized acceptance and understanding.  Flu Vaccine status: Due, Education has been provided regarding the importance of this vaccine. Advised may receive this vaccine at local pharmacy or Health Dept.  Aware to provide a copy of the vaccination record if obtained from local pharmacy or Health Dept. Verbalized acceptance and understanding.  Pneumococcal vaccine status: Up to date  Covid-19 vaccine status: Completed vaccines  Qualifies for Shingles Vaccine? Yes   Zostavax completed Yes   Shingrix Completed?: Yes  Screening Tests Health Maintenance  Topic Date Due   Hepatitis C Screening  Never done   Zoster Vaccines-  Shingrix (2 of 2) 12/07/2020   HEMOGLOBIN A1C  06/11/2021   FOOT EXAM  06/22/2021   TETANUS/TDAP  08/29/2021   INFLUENZA VACCINE  10/21/2021 (Originally 02/21/2021)   OPHTHALMOLOGY EXAM  04/06/2022   Pneumonia Vaccine 12+ Years old  Completed   COVID-19 Vaccine  Completed   HPV VACCINES  Aged Out    Health Maintenance  Health Maintenance Due  Topic Date Due   Hepatitis C Screening  Never done   Zoster Vaccines- Shingrix (2 of 2) 12/07/2020   HEMOGLOBIN A1C  06/11/2021   FOOT EXAM  06/22/2021   TETANUS/TDAP  08/29/2021    Colorectal cancer screening: No longer required.    Additional Screening:  Hepatitis C Screening: does qualify;  Vision Screening: Recommended annual ophthalmology exams for early detection of glaucoma and other disorders of the eye. Is the patient up to date with their annual eye exam?  Yes  Who is the provider or what is the name of the office in which the patient attends annual eye exams? Dr Katy Fitch Dr Zadie Rhine  If pt is not established with a provider, would they like to be referred to a provider to establish care? No .   Dental Screening: Recommended annual dental exams for proper oral hygiene  Community Resource Referral / Chronic Care Management: CRR required this visit?  No   CCM required this visit?  No      Plan:     I have personally reviewed and noted the following in the patients chart:   Medical and social history Use of alcohol, tobacco or illicit drugs  Current medications and supplements including opioid  prescriptions. Patient is not currently taking opioid prescriptions. Functional ability and status Nutritional status Physical activity Advanced directives List of other physicians Hospitalizations, surgeries, and ER visits in previous 12 months Vitals Screenings to include cognitive, depression, and falls Referrals and appointments  In addition, I have reviewed and discussed with patient certain preventive protocols, quality metrics, and best practice recommendations. A written personalized care plan for preventive services as well as general preventive health recommendations were provided to patient.     Willette Brace, LPN   11/24/9670   Nurse Notes: pt taking insulin different from what's directed has a visit on 09/06/21 to discuss.

## 2021-09-06 ENCOUNTER — Ambulatory Visit (INDEPENDENT_AMBULATORY_CARE_PROVIDER_SITE_OTHER): Payer: Medicare Other | Admitting: Family Medicine

## 2021-09-06 ENCOUNTER — Other Ambulatory Visit: Payer: Self-pay

## 2021-09-06 ENCOUNTER — Encounter: Payer: Self-pay | Admitting: Family Medicine

## 2021-09-06 VITALS — BP 91/60 | HR 84 | Temp 97.8°F | Ht 71.0 in | Wt 231.2 lb

## 2021-09-06 DIAGNOSIS — N3281 Overactive bladder: Secondary | ICD-10-CM | POA: Diagnosis not present

## 2021-09-06 NOTE — Progress Notes (Signed)
OFFICE VISIT  09/06/2021  CC:  Chief Complaint  Patient presents with   Follow-up    OAB   Patient is a 81 y.o. male who presents for 1 mo f/u OAB. A/P as of last visit: "#1 hypertension.  Well-controlled. Cont entresto, espleronone, and toprol. Lytes/cr today.   #2 hypercholesterolemia.  Doing well on Crestor 20 mg a day. Fasting lipid panel and hepatic panel today.  3.  Chronic renal insufficiency stage IIIa. Avoiding NSAIDs.  Trying to hydrate adequately. Electrolytes and creatinine today.  4.  Overactive bladder.  No response to Ditropan XL 5/day.  Increase to 10 mg daily and recheck in 1 month.   #5 diabetes type 2.  Management per Dr. Cruzita Lederer.  He is doing excellent and very happy with this."  INTERIM HX: All labs excellent last visit. He does feel like his urinary urgency and frequency in the daytime is improved, also less nocturia. Denies side effects from our increase in Ditropan.    Past Medical History:  Diagnosis Date   AICD (automatic cardioverter/defibrillator) present 2018   MDT CRT-D.  Fatigue-->completely pacer dependent.  Pacer settings adjusted 12/2017 to allow more chronotrophc variance with ADL's//exertion.   Arthritis    Pt is s/p eft reverse total shoulder arthroplasty.   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   02/2016 EF 40%, diffuse hypokinesis, grade 2 DD.  Myoc perf imaging showed EF 32% 04/2016.  Pt upgraded to CRT-D 01/04/17.   Chronic renal insufficiency, stage III (moderate) (HCC) 2015   CrCl about 60 ml/min   Complete heart block (HCC)    Has dual chamber pacer.   COVID-19 virus infection 01/05/2021   paxlovid   Depression    DOE (dyspnea on exertion)    NYHA class II/III CHF   Dyspnea 2021   with exertion, bending over   Episodic low back pain  01/22/2013   w/intermittent radiculitis (12/2014 his neurologist referred him to pain mgmt for epidural steroid injection)   Erectile dysfunction 2019   due to zoloft--urol rx'd viagra   GERD (gastroesophageal reflux disease)    H/O tilt table evaluation 81/85/6314   negative   Helicobacter pylori gastritis 01/2016   History of adenomatous polyp of colon 10/12/2011   Dr. [name] (3 right side of colon- tubular adenomas removed)   History of cardiovascular stress test 05/28/2012   no ischemia, EF 37%, imaging results are unchanged and within normal variance   History of chronic prostatitis    History of kidney stones    History of vertigo    + Hx of posterior HA's.  Neuro (Dr. Erling Cruz) eval 2011.  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   Hyperlipidemia    Hypertension    Lumbar spondylosis    lumbosacral radiculopathy at L4 by EMG testing, right foot drop (neurologist is Dr. Linus Salmons with [facility] in [place])--neurologist referred him to neurosurgery   Migraine    "used to have them all the time; none for years" (01/04/2017)   Myocardial infarction [facility]) ?1970s   not entirely certain of this   Nephrolithiasis 07/2012   Left UVJ 2 mm stone with dilation of renal collecting system and slight hydroureter on right   Neuropathy    NICM (nonischemic cardiomyopathy) ([place])    a.  02/2018 Cath: LM nl, LAD min irregs, LCX no, RCA 20d. [id number]. Fick CO/CI 4.4/2.0.   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  Since no CAD on cath 05/2016, cards recommends upgrade to CRT-D.   Permanent atrial fibrillation ([place])    DCCV 07/09/13-converted, lasted two days, then back into afib--needs lifetime anticoagulation (Xarelto as of 09/2014)   Prostate cancer screening 09/2017   done by urol annually (normal prostate exam documented + PSA 0.84 as of 10/01/17 urol f/u.   10/2018 urol f/u PSA 0.6, no prostate nodule.   Rectus diastasis    Right ankle sprain 08/2017   w/distal fibula avulsion fx noted on u/s but not plain film-(Dr. Hudnall).   Skin cancer of arm, left    "burned it off" (01/04/2017)   TIA (transient ischemic attack)    L face and L arm weakness. Peri procedural->a. 03/22/2018 following cath. CT head neg. No MRI b/c has pacer. Likely due to embolus to distal branch of RMCA   Type II diabetes mellitus ([place])     Past Surgical History:  Procedure Laterality Date   ABI's Bilateral 05/21/2018   normal   BACK SURGERY     BIV ICD INSERTION CRT-D N/A 01/04/2017   Procedure: BiV ICD ;  Surgeon: Constance Haw, MD;  Location: [facility];  Service: Cardiovascular;  Laterality: N/A;   CARDIAC CATHETERIZATION N/A 06/14/2016   Minimal nonobstructive dz, EF 25-35%.  Procedure: Left Heart Cath and Coronary Angiography;  Surgeon: Peter M Martinique, MD;  Location: [facility];  Service: Cardiovascular;  Laterality: N/A;   CARDIOVASCULAR STRESS TEST  2012   2012 nuclear perfusion study: low risk scan; 04/2016 normal myocardial perfusion imaging, EF 32%.   CARDIOVERSION  07/09/2012   Procedure: CARDIOVERSION;  Surgeon: [name], MD;  Location: [facility];  Service: Cardiovascular;  Laterality: N/A;   CATARACT EXTRACTION W/ INTRAOCULAR LENS IMPLANT & ANTERIOR VITRECTOMY, BILATERAL Bilateral    CIRCUMCISION N/A 03/23/2020   Procedure: CIRCUMCISION ADULT;  Surgeon: Remi Haggard, MD;  Location: [facility];  Service: Urology;  Laterality: N/A;   COLONOSCOPY W/ POLYPECTOMY  approx 2006; repeated 09/2011   Polyps on 2013 EGD as well, repeat 12/2014   COLONOSCOPY WITH PROPOFOL N/A 07/08/2021   adenoma x 1. Procedure: COLONOSCOPY WITH PROPOFOL;  Surgeon: Carol Ada, MD;  Location: [facility];  Service: Endoscopy;  Laterality: N/A;   ESOPHAGOGASTRODUODENOSCOPY  10/18/2006   Done due to chronic GERD: Normal, bx showed no barrett's esophagus  (Dr. [name])   [place] Left 10/02/2016   Procedure: LEFT RING FINGER WOUND EXPORATION AND FLEXOR TENDON REPAIR AND NERVE REPAIR;  Surgeon: Milly Jakob, MD;  Location: [facility];  Service: Orthopedics;  Laterality: Left;   INSERT / REPLACE / REMOVE PACEMAKER  02/05/2012   dual chamber, sinus node dysfunction, sinus arrest, PAF, Medtronic Revo serial#[id number] H: last checked 05/2015   LUMBAR LAMINECTOMY Left 1976   L4-5   PACEMAKER REMOVAL  01/04/2017   PERMANENT PACEMAKER INSERTION N/A 02/05/2012   Procedure: PERMANENT PACEMAKER INSERTION;  Surgeon: [name], MD; Generator Medtronic [place] model [place] serial number [id number] H Laterality: N/A;   POLYPECTOMY  07/08/2021   Procedure: POLYPECTOMY;  Surgeon: Carol Ada, MD;  Location: [facility];  Service: Endoscopy;;   RETINAL DETACHMENT SURGERY Left ~ [place] Left 2018   Left shoulder reverse [name] in [place]).   RIGHT/LEFT HEART CATH AND CORONARY ANGIOGRAPHY  N/A 03/22/2018   EF 30-35%, no CAD.  Procedure: RIGHT/LEFT HEART CATH AND CORONARY ANGIOGRAPHY;  Surgeon: Jolaine Artist, MD;  Location: [facility];  Service: Cardiovascular;  Laterality: N/A;   TRANSTHORACIC ECHOCARDIOGRAM  08/25/10; 05/2012; 03/23/16;12/2017   mild asymmetric LVH, normal systolic function, normal diastolic fxn, mild-to-mod mitral regurg, mild aortic valve sclerosis and trace AI, mild aortic root dilatation. 2014 f/u showed EF 40-45%, mod LAE, A FIB.  02/2016 EF 40%, diffuse hypokinesis, grade 2 DD. 12/2017 EF 35-40%,diffuse hypokin,grd III DD, mild MR   WISDOM TOOTH EXTRACTION  06/27/2021   EF 50-55%, mild aortic root/ascending aorta dilatation (41-42 mm).    Outpatient Medications Prior to Visit  Medication Sig Dispense Refill   acetaminophen (TYLENOL) 325 MG tablet Take 650 mg by mouth every 6 (six) hours as needed for moderate pain.     BD INSULIN SYRINGE U/F 31G X 5/16" 1 ML MISC USE DAILY AS  DIRECTED 100 each 3   COMBIGAN 0.2-0.5 % ophthalmic solution Place 1 drop into both eyes at bedtime.      Continuous Blood Gluc Sensor (FREESTYLE LIBRE SENSOR SYSTEM) MISC USE TO CHECK BLOOD SUGARS 4 TIMES DAILY. Before breakfast, before lunch, before supper, and at bedtime 1 each 3   diclofenac Sodium (VOLTAREN) 1 % GEL Apply 4 g topically 4 (four) times daily. 100 g 1   Dulaglutide (TRULICITY) 3 IF/0.2DX SOPN Inject 3 mg into the skin once a week. 6 mL 1   empagliflozin (JARDIANCE) 10 MG TABS tablet Take 1 tablet (10 mg total) by mouth daily. **Need appointment before anymore refills given** 90 tablet 0   ENTRESTO 97-103 MG TAKE 1 TABLET TWO TIMES A DAY 180 tablet 3   eplerenone (INSPRA) 25 MG tablet Take 1 tablet (25 mg total) by mouth daily. 90 tablet 3   furosemide (LASIX) 20 MG tablet Take 1 tablet (20 mg total) by mouth daily as needed for edema (For a weight gain of 3 pounds overnight or 5 pounds in one week). 30 tablet 5   gabapentin (NEURONTIN) 300 MG capsule TAKE 2 CAPSULES TWICE A DAY (Patient taking differently: Take 1,200 mg by mouth 2 (two) times daily.) 360 capsule 3   insulin aspart (NOVOLOG FLEXPEN) 100 UNIT/ML FlexPen INJECT 6-8 UNITS UNDER THE SKIN AT THE TIME OF EACH MEAL (Patient taking differently: Pt stated 20 units in the a.m only   UNDER THE SKIN AT THE TIME OF EACH MEAL) 15 mL 4   insulin glargine (LANTUS) 100 UNIT/ML injection Inject 0.36-0.4 mLs (36-40 Units total) into the skin daily. INJECT 54 UNITS UNDER THE SKIN DAILY, WILL BE TITRATING DOSE PERIODICALLY (Patient taking differently: Inject 8-18 Units into the skin at bedtime. At bedtime 43 units) 30 mL 3   Insulin Pen Needle (SURE COMFORT PEN NEEDLES) 31G X 5 MM MISC USE TO INJECT INSULIN UNDER THE SKIN 100 each 11   ketorolac (ACULAR) 0.5 % ophthalmic solution Place 1 drop into the left eye 2 (two) times daily. 5 mL 1   latanoprost (XALATAN) 0.005 % ophthalmic solution Place 1 drop into both eyes at bedtime.   12    lidocaine (LIDODERM) 5 % Place 1 patch onto the skin daily. Remove & Discard patch within 12 hours or as directed by MD (Patient taking differently: Place 1 patch onto the skin daily as needed (pain). Remove & Discard patch within 12 hours or as directed by MD) 30 patch 0   meclizine (ANTIVERT) 25 MG tablet TAKE  1 TABLET THREE TIMES A DAY AS NEEDED FOR DIZZINESS (Patient taking differently: Take 25 mg by mouth 3 (three) times daily.) 270 tablet 3   metoprolol succinate (TOPROL-XL) 50 MG 24 hr tablet TAKE 1 TABLET DAILY. TAKE WITH OR IMMEDIATELY FOLLOWING A MEAL (Patient taking differently: Take 50 mg by mouth daily.) 90 tablet 0   omeprazole (PRILOSEC) 40 MG capsule Take 1 capsule (40 mg total) by mouth daily. 90 capsule 1   oxybutynin (DITROPAN XL) 10 MG 24 hr tablet Take 1 tablet (10 mg total) by mouth at bedtime. 90 tablet 3   rOPINIRole (REQUIP) 1 MG tablet TAKE 1 TABLET DAILY 90 tablet 3   rosuvastatin (CRESTOR) 20 MG tablet Take 1 tablet (20 mg total) by mouth daily. 90 tablet 3   sertraline (ZOLOFT) 100 MG tablet Take 1 tablet (100 mg total) by mouth daily. 90 tablet 3   XARELTO 20 MG TABS tablet TAKE 1 TABLET DAILY WITH SUPPER (Patient taking differently: Take 20 mg by mouth daily with supper.) 90 tablet 3   Continuous Blood Gluc Sensor (FREESTYLE LIBRE 14 DAY SENSOR) MISC Check glucose 4 times per day 6 each 3   No facility-administered medications prior to visit.    No Known Allergies  ROS As per HPI  PE: Vitals with BMI 09/06/2021 08/31/2021 08/09/2021  Height 5\' 11"  - 5\' 11"   Weight 231 lbs 3 oz 233 lbs 13 oz 231 lbs 10 oz  BMI 32.26 82.50 53.97  Systolic 91 673 419  Diastolic 60 82 74  Pulse 84 88 59     Physical Exam  Gen: Alert, well appearing.  Patient is oriented to person, place, time, and situation. AFFECT: pleasant, lucid thought and speech. No further exam today.  LABS:  Last CBC Lab Results  Component Value Date   WBC 5.6 06/09/2021   HGB 16.0 06/09/2021    HCT 49.2 06/09/2021   MCV 92.5 06/09/2021   MCH 30.3 03/19/2020   RDW 13.6 06/09/2021   PLT 129.0 (L) 37/90/2409   Last metabolic panel Lab Results  Component Value Date   GLUCOSE 131 (H) 08/09/2021   NA 138 08/09/2021   K 4.2 08/09/2021   CL 102 08/09/2021   CO2 27 08/09/2021   BUN 25 (H) 08/09/2021   CREATININE 1.55 (H) 08/09/2021   GFRNONAA 37 (L) 03/23/2020   CALCIUM 9.2 08/09/2021   PROT 7.4 08/09/2021   ALBUMIN 4.3 08/09/2021   BILITOT 0.7 08/09/2021   ALKPHOS 73 08/09/2021   AST 20 08/09/2021   ALT 18 08/09/2021   ANIONGAP 9 03/23/2020   IMPRESSION AND PLAN:  Overactive bladder.  Symptoms improved with 10 mg Ditropan XL daily. No changes today.   (NOTE: In review of his med list from the past he has no other OAB medicine listed).  An After Visit Summary was printed and given to the patient.  FOLLOW UP: Return in about 3 months (around 12/04/2021) for routine chronic illness f/u.  Signed:  Crissie Sickles, MD           09/06/2021

## 2021-09-06 NOTE — Patient Instructions (Signed)
You have refills on your ditropan---just request RF from pharmacy when current supply is running low.

## 2021-09-08 ENCOUNTER — Other Ambulatory Visit: Payer: Self-pay

## 2021-09-08 ENCOUNTER — Encounter: Payer: Self-pay | Admitting: Internal Medicine

## 2021-09-08 ENCOUNTER — Ambulatory Visit (INDEPENDENT_AMBULATORY_CARE_PROVIDER_SITE_OTHER): Payer: Medicare Other | Admitting: Internal Medicine

## 2021-09-08 VITALS — BP 128/82 | HR 86 | Ht 71.0 in | Wt 231.4 lb

## 2021-09-08 DIAGNOSIS — E785 Hyperlipidemia, unspecified: Secondary | ICD-10-CM | POA: Diagnosis not present

## 2021-09-08 DIAGNOSIS — E1159 Type 2 diabetes mellitus with other circulatory complications: Secondary | ICD-10-CM

## 2021-09-08 DIAGNOSIS — E1165 Type 2 diabetes mellitus with hyperglycemia: Secondary | ICD-10-CM

## 2021-09-08 LAB — POCT GLYCOSYLATED HEMOGLOBIN (HGB A1C): Hemoglobin A1C: 7.2 % — AB (ref 4.0–5.6)

## 2021-09-08 NOTE — Patient Instructions (Addendum)
Please continue: - Jardiance 10 mg before breakfast - Trulicity 3 mg weekly - Lantus 42 units at bedtime  Change: - NovoLog  18-20 units before brunch 7-8 units before a larger dinner If you have a snack at night, you may need ~5 units before the snack  Please return in 4 months.

## 2021-09-08 NOTE — Progress Notes (Signed)
Patient ID: Mason [name], male   DOB: 03-01-1941, 81 y.o.   MRN: [id number]   This visit occurred during the SARS-CoV-2 public health emergency.  Safety protocols were in place, including screening questions prior to the visit, additional usage of staff PPE, and extensive cleaning of exam room while observing appropriate contact time as indicated for disinfecting solutions.   HPI: Mason [name] is a 81 y.o.-year-old male, initially referred by his PCP, Dr. Anitra Lauth, returning for follow-up for DM2, dx in ~2011, insulin-dependent since 2019, uncontrolled, with long-term complications (CAD with ?h/o AMI, nonischemic CMP, CHF, Afib, cerebrovascular disease with h/o TIA, CKD stage III, peripheral neuropathy, ED).  Last visit 4 months ago. He is here with his wife.  Interim history: No increased urination, nausea, chest pain.  He does have blurry vision.  He sees Dr. Zadie Rhine.   Reviewed HbA1c levels:  04/11/2021: HbA1c 7.1% Lab Results  Component Value Date   HGBA1C 6.7 (A) 12/09/2020   HGBA1C 7.6 (A) 09/09/2020   HGBA1C 9.7 (H) 06/22/2020   HGBA1C 9.0 (H) 03/23/2020   HGBA1C 9.2 (H) 03/16/2020   HGBA1C 9.9 (H) 11/20/2019   HGBA1C 9.7 (H) 05/27/2019   HGBA1C 9.0 (H) 02/26/2019   HGBA1C 10.1 (H) 11/29/2018   HGBA1C 9.0 (A) 08/29/2018   HGBA1C 9.0 08/29/2018   HGBA1C 9.0 (A) 08/29/2018   HGBA1C 9.0 (A) 08/29/2018   HGBA1C 7.8 (A) 05/14/2018   HGBA1C 8.1 (H) 02/06/2018   HGBA1C 8.0 (H) 10/26/2017   HGBA1C 8.2 (H) 07/27/2017   HGBA1C 7.0 03/01/2017   HGBA1C 7.1 11/16/2016   HGBA1C 7.8 (H) 07/03/2016   HGBA1C 6.8 03/14/2016   HGBA1C 7.0 (H) 12/09/2015   HGBA1C 7.4 (H) 09/20/2015   HGBA1C 7.2 (H) 03/25/2015   HGBA1C 7.5 (H) 10/15/2014   HGBA1C 7.1 (H) 06/15/2014   HGBA1C 6.9 (H) 02/12/2014   HGBA1C 8.6 (H) 10/14/2013   HGBA1C 7.5 (H) 07/15/2013   HGBA1C 6.7 (H) 01/03/2013   We changed to the following regimen: - Jardiance 10 mg before breakfast - Trulicity 1.5 >> 3  mg weekly -  NovoLog pens : 12 (-10-10) >> 6-8  before B-L-D >> 18 units before Brunch and 8 units before D at 6 pm, banana split at 8 pm >> now: 14-18 >> 20 units before brunch 8-10 units before dinner If you have a snack at night, you may need at least 5 units before the snack - Lantus vial 44 >> 40 >> 36-40 >> 42 units at bedtime She was on metformin in the past.  Pt checks his sugars more than 4 times a day with his freestyle libre CGM:  Previously:   Previously:   Lowest sugar was: 45 >> 70 >> 60 >> 50s >> 48; he has hypoglycemia awareness at 70.  Highest sugar was 400 >> 200s >> 200 >> 200s >> 260  Glucometer: Freestyle  Pt's meals are: - Breakfast: cereals, eggs, grits, toast - Lunch: may skip or sandwich - Dinner: meat + veggies + starch - Snacks: apple/cheese >> bananasplits at night Patient saw nutrition a long time ago.  -+ Stage III CKD, last BUN/creatinine:  Lab Results  Component Value Date   BUN 25 (H) 08/09/2021   BUN 25 (H) 06/09/2021   CREATININE 1.55 (H) 08/09/2021   CREATININE 1.51 (H) 06/09/2021  04/11/2021: 31/1.86, GFR 37, glucose 133 On Entresto.  -+ HL; last set of lipids: Lab Results  Component Value Date   CHOL 120 08/09/2021   HDL 38.30 (  L) 08/09/2021   LDLCALC 53 08/09/2021   LDLDIRECT 103.0 06/22/2020   TRIG 146.0 08/09/2021   CHOLHDL 3 08/09/2021  04/11/2021: 141/214/45/53 Previously on Zocor 20, but now Crestor 20 mg daily.  - last eye exam was in 03/2021: No DR, + glaucoma.  -He has numbness and tingling in his feet.  On Neurontin 300 mg twice a day.  Up-to-date with foot exam: last visit with podiatry 08/15/2021  -annual foot exam coming up in 09/2021.  No known FH of DM.  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 Most recent vitamin B12 level: 04/11/2021: 429  Drinks beer 2-3x a day, but not quite every day.  ROS: + see HPI Neurological: no  tremors/+ numbness/+ tingling/no dizziness  I reviewed pt's medications, allergies, PMH, social hx, family hx, and changes were documented in the history of present illness. Otherwise, unchanged from my initial visit note.  Past Medical History:  Diagnosis Date   AICD (automatic cardioverter/defibrillator) present 2018   MDT CRT-D.  Fatigue-->completely pacer dependent.  Pacer settings adjusted 12/2017 to allow more chronotrophc variance with ADL's//exertion.   Arthritis    Pt is s/p eft reverse total shoulder arthroplasty.   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   02/2016 EF 40%, diffuse hypokinesis, grade 2 DD.  Myoc perf imaging showed EF 32% 04/2016.  Pt upgraded to CRT-D 01/04/17.   Chronic renal insufficiency, stage III (moderate) (HCC) 2015   CrCl about 60 ml/min   Complete heart block (HCC)    Has dual chamber pacer.   COVID-19 virus infection 01/05/2021   paxlovid   Depression    DOE (dyspnea on exertion)    NYHA class II/III CHF   Dyspnea 2021   with exertion, bending over   Episodic low back pain 01/22/2013   w/intermittent radiculitis (12/2014 his neurologist referred him to pain mgmt for epidural steroid injection)   Erectile dysfunction 2019   due to zoloft--urol rx'd viagra   GERD (gastroesophageal reflux disease)    H/O tilt table evaluation 77/05/6578   negative   Helicobacter pylori gastritis 01/2016   History of adenomatous polyp of colon 10/12/2011   Dr. [name] (3 right side of colon- tubular adenomas removed)   History of cardiovascular stress test 05/28/2012   no ischemia, EF 37%, imaging results are unchanged and within normal variance   History of chronic prostatitis    History of kidney stones    History of vertigo    + Hx of posterior HA's.  Neuro (Dr. Erling Cruz) eval 2011.   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   Hyperlipidemia    Hypertension    Lumbar spondylosis    lumbosacral radiculopathy at L4 by EMG testing, right foot drop (neurologist is Dr. Linus Salmons with [facility] in [place])--neurologist referred him to neurosurgery   Migraine    "used to have them all the time; none for years" (01/04/2017)   Myocardial infarction [facility]) ?1970s   not entirely certain of this   Nephrolithiasis 07/2012   Left UVJ 2 mm stone with dilation of renal collecting system and slight hydroureter on right   Neuropathy    NICM (nonischemic cardiomyopathy) ([place])    a. 02/2018 Cath:  LM nl, LAD min irregs, LCX no, RCA 20d. HGDJ24. Fick CO/CI 4.4/2.0.   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  Since no CAD on cath 05/2016, cards recommends upgrade to CRT-D.   Permanent atrial fibrillation ([place])    DCCV 07/09/13-converted, lasted two days, then back into afib--needs lifetime anticoagulation (Xarelto as of 09/2014)   Prostate cancer screening 09/2017   done by urol annually (normal prostate exam documented + PSA 0.84 as of 10/01/17 urol f/u.  10/2018 urol f/u PSA 0.6, no prostate nodule.   Rectus diastasis    Right ankle sprain 08/2017   w/distal fibula avulsion fx noted on u/s but not plain film-(Dr. Hudnall).   Skin cancer of arm, left    "burned it off" (01/04/2017)   TIA (transient ischemic attack)    L face and L arm weakness. Peri procedural->a. 03/22/2018 following cath. CT head neg. No MRI b/c has pacer. Likely due to embolus to distal branch of RMCA   Type II diabetes mellitus ([place])    Past Surgical History:  Procedure Laterality Date   ABI's Bilateral 05/21/2018   normal   BACK SURGERY     BIV ICD INSERTION CRT-D N/A 01/04/2017   Procedure: BiV ICD ;  Surgeon: Constance Haw, MD;  Location: [facility];  Service:  Cardiovascular;  Laterality: N/A;   CARDIAC CATHETERIZATION N/A 06/14/2016   Minimal nonobstructive dz, EF 25-35%.  Procedure: Left Heart Cath and Coronary Angiography;  Surgeon: Peter M Martinique, MD;  Location: [facility];  Service: Cardiovascular;  Laterality: N/A;   CARDIOVASCULAR STRESS TEST  2012   2012 nuclear perfusion study: low risk scan; 04/2016 normal myocardial perfusion imaging, EF 32%.   CARDIOVERSION  07/09/2012   Procedure: CARDIOVERSION;  Surgeon: [name], MD;  Location: [facility];  Service: Cardiovascular;  Laterality: N/A;   CATARACT EXTRACTION W/ INTRAOCULAR LENS IMPLANT & ANTERIOR VITRECTOMY, BILATERAL Bilateral    CIRCUMCISION N/A 03/23/2020   Procedure: CIRCUMCISION ADULT;  Surgeon: Remi Haggard, MD;  Location: [facility];  Service: Urology;  Laterality: N/A;   COLONOSCOPY W/ POLYPECTOMY  approx 2006; repeated 09/2011   Polyps on 2013 EGD as well, repeat 12/2014   COLONOSCOPY WITH PROPOFOL N/A 07/08/2021   adenoma x 1. Procedure: COLONOSCOPY WITH PROPOFOL;  Surgeon: Carol Ada, MD;  Location: [facility];  Service: Endoscopy;  Laterality: N/A;   ESOPHAGOGASTRODUODENOSCOPY  10/18/2006   Done due to chronic GERD: Normal, bx showed no barrett's esophagus (Dr. [name])   [place] Left 10/02/2016   Procedure: LEFT RING FINGER WOUND EXPORATION AND FLEXOR TENDON REPAIR AND NERVE REPAIR;  Surgeon: Milly Jakob, MD;  Location: [facility];  Service: Orthopedics;  Laterality: Left;   INSERT / REPLACE / REMOVE PACEMAKER  02/05/2012   dual chamber, sinus node dysfunction, sinus arrest, PAF, Medtronic Revo serial#-[id number] H: last checked 05/2015   LUMBAR LAMINECTOMY Left 1976   L4-5   PACEMAKER REMOVAL  01/04/2017   PERMANENT PACEMAKER INSERTION N/A 02/05/2012   Procedure: PERMANENT PACEMAKER INSERTION;  Surgeon: [name], MD; Generator Medtronic Wallace model [place] serial number [id number] H Laterality: N/A;   POLYPECTOMY  07/08/2021   Procedure:  POLYPECTOMY;  Surgeon: Carol Ada, MD;  Location: [facility];  Service: Endoscopy;;   RETINAL DETACHMENT SURGERY Left ~ [place] Left 2018   Left shoulder reverse [name] in [place]).   RIGHT/LEFT HEART CATH AND CORONARY ANGIOGRAPHY N/A 03/22/2018  EF 30-35%, no CAD.  Procedure: RIGHT/LEFT HEART CATH AND CORONARY ANGIOGRAPHY;  Surgeon: Jolaine Artist, MD;  Location: [facility];  Service: Cardiovascular;  Laterality: N/A;   TRANSTHORACIC ECHOCARDIOGRAM  08/25/10; 05/2012; 03/23/16;12/2017   mild asymmetric LVH, normal systolic function, normal diastolic fxn, mild-to-mod mitral regurg, mild aortic valve sclerosis and trace AI, mild aortic root dilatation. 2014 f/u showed EF 40-45%, mod LAE, A FIB.  02/2016 EF 40%, diffuse hypokinesis, grade 2 DD. 12/2017 EF 35-40%,diffuse hypokin,grd III DD, mild MR   WISDOM TOOTH EXTRACTION  06/27/2021   EF 50-55%, mild aortic root/ascending aorta dilatation (41-42 mm).   Social History   Socioeconomic History   Marital status: Married    Spouse name: Not on file   Number of children: Not on file   Years of education: Not on file   Highest education level: 12th grade  Occupational History   Not on file  Tobacco Use   Smoking status: Never   Smokeless tobacco: Never  Vaping Use   Vaping Use: Never used  Substance and Sexual Activity   Alcohol use: Yes    Comment: occ   Drug use: No   Sexual activity: Not on file  Other Topics Concern   Not on file  Social History Narrative   Not on file   Social Determinants of Health   Financial Resource Strain: Low Risk    Difficulty of Paying Living Expenses: Not hard at all  Food Insecurity: No Food Insecurity   Worried About Charity fundraiser in the Last Year: Never true   [place] in the Last Year: Never true  Transportation Needs: No Transportation Needs   Lack of Transportation (Medical): No   Lack of Transportation (Non-Medical): No   Physical Activity: Inactive   Days of Exercise per Week: 0 days   Minutes of Exercise per Session: 0 min  Stress: No Stress Concern Present   Feeling of Stress : Only a little  Social Connections: Engineer, building services of Communication with Friends and Family: More than three times a week   Frequency of Social Gatherings with Friends and Family: More than three times a week   Attends Religious Services: More than 4 times per year   Active Member of [employer] or Organizations: Yes   Attends Archivist Meetings: 1 to 4 times per year   Marital Status: Married  Human resources officer Violence: Not At Risk   Fear of Current or Ex-Partner: No   Emotionally Abused: No   Physically Abused: No   Sexually Abused: No   Current Outpatient Medications on File Prior to Visit  Medication Sig Dispense Refill   acetaminophen (TYLENOL) 325 MG tablet Take 650 mg by mouth every 6 (six) hours as needed for moderate pain.     BD INSULIN SYRINGE U/F 31G X 5/16" 1 ML MISC USE DAILY AS DIRECTED 100 each 3   COMBIGAN 0.2-0.5 % ophthalmic solution Place 1 drop into both eyes at bedtime.      Continuous Blood Gluc Sensor (FREESTYLE LIBRE SENSOR SYSTEM) MISC USE TO CHECK BLOOD SUGARS 4 TIMES DAILY. Before breakfast, before lunch, before supper, and at bedtime 1 each 3   diclofenac Sodium (VOLTAREN) 1 % GEL Apply 4 g topically 4 (four) times daily. 100 g 1   Dulaglutide (TRULICITY) 3 KD/3.2IZ SOPN Inject 3 mg into the skin once a week. 6 mL 1   empagliflozin (JARDIANCE) 10 MG TABS tablet Take 1 tablet (  10 mg total) by mouth daily. **Need appointment before anymore refills given** 90 tablet 0   ENTRESTO 97-103 MG TAKE 1 TABLET TWO TIMES A DAY 180 tablet 3   eplerenone (INSPRA) 25 MG tablet Take 1 tablet (25 mg total) by mouth daily. 90 tablet 3   furosemide (LASIX) 20 MG tablet Take 1 tablet (20 mg total) by mouth daily as needed for edema (For a weight gain of 3 pounds overnight or 5 pounds in one week).  30 tablet 5   gabapentin (NEURONTIN) 300 MG capsule TAKE 2 CAPSULES TWICE A DAY (Patient taking differently: Take 1,200 mg by mouth 2 (two) times daily.) 360 capsule 3   insulin aspart (NOVOLOG FLEXPEN) 100 UNIT/ML FlexPen INJECT 6-8 UNITS UNDER THE SKIN AT THE TIME OF EACH MEAL (Patient taking differently: Pt stated 20 units in the a.m only   UNDER THE SKIN AT THE TIME OF EACH MEAL) 15 mL 4   insulin glargine (LANTUS) 100 UNIT/ML injection Inject 0.36-0.4 mLs (36-40 Units total) into the skin daily. INJECT 54 UNITS UNDER THE SKIN DAILY, WILL BE TITRATING DOSE PERIODICALLY (Patient taking differently: Inject 8-18 Units into the skin at bedtime. At bedtime 43 units) 30 mL 3   Insulin Pen Needle (SURE COMFORT PEN NEEDLES) 31G X 5 MM MISC USE TO INJECT INSULIN UNDER THE SKIN 100 each 11   ketorolac (ACULAR) 0.5 % ophthalmic solution Place 1 drop into the left eye 2 (two) times daily. 5 mL 1   latanoprost (XALATAN) 0.005 % ophthalmic solution Place 1 drop into both eyes at bedtime.   12   lidocaine (LIDODERM) 5 % Place 1 patch onto the skin daily. Remove & Discard patch within 12 hours or as directed by MD (Patient taking differently: Place 1 patch onto the skin daily as needed (pain). Remove & Discard patch within 12 hours or as directed by MD) 30 patch 0   meclizine (ANTIVERT) 25 MG tablet TAKE 1 TABLET THREE TIMES A DAY AS NEEDED FOR DIZZINESS (Patient taking differently: Take 25 mg by mouth 3 (three) times daily.) 270 tablet 3   metoprolol succinate (TOPROL-XL) 50 MG 24 hr tablet TAKE 1 TABLET DAILY. TAKE WITH OR IMMEDIATELY FOLLOWING A MEAL (Patient taking differently: Take 50 mg by mouth daily.) 90 tablet 0   omeprazole (PRILOSEC) 40 MG capsule Take 1 capsule (40 mg total) by mouth daily. 90 capsule 1   oxybutynin (DITROPAN XL) 10 MG 24 hr tablet Take 1 tablet (10 mg total) by mouth at bedtime. 90 tablet 3   rOPINIRole (REQUIP) 1 MG tablet TAKE 1 TABLET DAILY 90 tablet 3   rosuvastatin (CRESTOR) 20 MG  tablet Take 1 tablet (20 mg total) by mouth daily. 90 tablet 3   sertraline (ZOLOFT) 100 MG tablet Take 1 tablet (100 mg total) by mouth daily. 90 tablet 3   XARELTO 20 MG TABS tablet TAKE 1 TABLET DAILY WITH SUPPER (Patient taking differently: Take 20 mg by mouth daily with supper.) 90 tablet 3   No current facility-administered medications on file prior to visit.   No Known Allergies Family History  Problem Relation Age of Onset   Heart failure Mother    Stroke Mother    Stroke Father    Heart disease Sister    Heart disease Sister    Cancer Sister        liver   Cancer Brother        lung   Cancer Brother  lung   Heart disease Brother    PE: BP 128/82 (BP Location: Right Arm, Patient Position: Sitting, Cuff Size: Normal)    Pulse 86    Ht 5\' 11"  (1.803 m)    Wt 231 lb 6.4 oz (105 kg)    SpO2 97%    BMI 32.27 kg/m  Wt Readings from Last 3 Encounters:  09/08/21 231 lb 6.4 oz (105 kg)  09/06/21 231 lb 3.2 oz (104.9 kg)  08/31/21 233 lb 12.8 oz (106.1 kg)   Constitutional: overweight, in NAD Eyes: PERRLA, EOMI, no exophthalmos ENT: moist mucous membranes, no thyromegaly, no cervical lymphadenopathy Cardiovascular: RRR, No MRG Respiratory: CTA B Musculoskeletal: no deformities, strength intact in all 4 Skin: moist, warm, no rashes Neurological: no tremor with outstretched hands, DTR normal in all 4  ASSESSMENT: 1. DM2, insulin-dependent, uncontrolled, with complications: - CAD with ?h/o AMI - nonischemic CMP - CHF, s/p AICD - Afib, s/p pacemaker - cerebrovascular disease with h/o TIA - CKD stage III - peripheral neuropathy - ED  No family history of medullary thyroid cancer or personal history of pancreatitis.  2. HL  PLAN:  1. Patient with longstanding, uncontrolled, type 2 diabetes, on oral antidiabetic regimen with SGLT2 inhibitor, also weekly GLP-1 receptor agonist and basal/bolus insulin regimen with fairly good control.  At last visit, HbA1c was 7.1%.   At that time, we discussed about optimizing his NovoLog doses.  I also advised him to reduce his carb content and discussed about healthier snacks.  He is still having a snack at night and he is not bolusing insulin before this. CGM interpretation: -At today's visit, we reviewed his CGM downloads: It appears that 71% of values are in target range (goal >70%), while 25% are higher than 180 (goal <25%), and 4% are lower than 70 (goal <4%).  The calculated average blood sugar is 153.  The projected HbA1c for the next 3 months (GMI) is 7.0%. -Reviewing the CGM trends, it appears that his sugars remain mostly at goal, but he does have mild hyperglycemic spikes after meals.  He also has low blood sugars, as low as 48 usually after his brunch.  Upon questioning, he is not injecting NovoLog 15 minutes before the meal, but only when the sugars already high after the meal.  I strongly advised him to move his bowels earlier.  He tells me that he is not usually taking his NovoLog before dinner.  Since sugars are only slightly high and only after certain meals, we discussed about taking a lower dose of NovoLog only for a larger dinner.  I did advise him that if he had a snack to take a lower amount of insulin before this. -For now we can continue the rest of the regimen.  He was off Trulicity for few days, but he was able to obtain it recently. - I suggested to:  Patient Instructions  Please continue: - Jardiance 10 mg before breakfast - Trulicity 3 mg weekly - Lantus 42 units at bedtime  Change: - NovoLog  18-20 units before brunch 7-8 units before a larger dinner If you have a snack at night, you may need ~5 units before the snack  Please return in 4 months.   - we checked his HbA1c: 7.2% (slightly higher) - advised to check sugars at different times of the day - 4x a day, rotating check times - advised for yearly eye exams >> he is UTD - return to clinic in 4 months  2. HL -  Reviewed latest lipid  panel from 03/2021: Fractions at goal with the exception of high triglycerides -He is on Crestor 20 mg daily - tolerated well  Philemon Kingdom, MD PhD [facility] Endocrinology

## 2021-09-12 ENCOUNTER — Other Ambulatory Visit: Payer: Self-pay

## 2021-09-12 ENCOUNTER — Ambulatory Visit (INDEPENDENT_AMBULATORY_CARE_PROVIDER_SITE_OTHER): Payer: Medicare Other | Admitting: Ophthalmology

## 2021-09-12 ENCOUNTER — Encounter (INDEPENDENT_AMBULATORY_CARE_PROVIDER_SITE_OTHER): Payer: Self-pay | Admitting: Ophthalmology

## 2021-09-12 DIAGNOSIS — H353132 Nonexudative age-related macular degeneration, bilateral, intermediate dry stage: Secondary | ICD-10-CM

## 2021-09-12 DIAGNOSIS — H43811 Vitreous degeneration, right eye: Secondary | ICD-10-CM

## 2021-09-12 DIAGNOSIS — Z8669 Personal history of other diseases of the nervous system and sense organs: Secondary | ICD-10-CM

## 2021-09-12 DIAGNOSIS — H35372 Puckering of macula, left eye: Secondary | ICD-10-CM | POA: Diagnosis not present

## 2021-09-12 DIAGNOSIS — H43812 Vitreous degeneration, left eye: Secondary | ICD-10-CM

## 2021-09-12 DIAGNOSIS — T8529XD Other mechanical complication of intraocular lens, subsequent encounter: Secondary | ICD-10-CM | POA: Diagnosis not present

## 2021-09-12 NOTE — Assessment & Plan Note (Signed)
OS stable 

## 2021-09-12 NOTE — Assessment & Plan Note (Signed)
OS retina attached stable

## 2021-09-12 NOTE — Assessment & Plan Note (Signed)
OS bag laxity continues yet still with acceptable visual acuity left eye.  I explained the patient that this lens while still on the bag, has poor zonules support and is likely to spontaneously dislocate 1 day at which time lens exchange with vitrectomy removal of lens and likely Yamane scleral tunnel fixation of posterior lens would be appropriate.

## 2021-09-12 NOTE — Assessment & Plan Note (Signed)
OU stable over time

## 2021-09-12 NOTE — Assessment & Plan Note (Signed)
OD stable 

## 2021-09-12 NOTE — Progress Notes (Signed)
09/12/2021     CHIEF COMPLAINT Patient presents for  Chief Complaint  Patient presents with   Retina Evaluation      HISTORY OF PRESENT ILLNESS: Mason [name] is a 81 y.o. male who presents to the clinic today for:   HPI     Retina Evaluation           Laterality: both eyes       Last edited by Silvestre Moment on 09/12/2021  1:12 PM.      Referring physician: Tammi Sou, MD [address] [place],   [address]  HISTORICAL INFORMATION:   Selected notes from the MEDICAL RECORD NUMBER    Lab Results  Component Value Date   HGBA1C 7.2 (A) 09/08/2021     CURRENT MEDICATIONS: Current Outpatient Medications (Ophthalmic Drugs)  Medication Sig   COMBIGAN 0.2-0.5 % ophthalmic solution Place 1 drop into both eyes at bedtime.    ketorolac (ACULAR) 0.5 % ophthalmic solution Place 1 drop into the left eye 2 (two) times daily.   latanoprost (XALATAN) 0.005 % ophthalmic solution Place 1 drop into both eyes at bedtime.    No current facility-administered medications for this visit. (Ophthalmic Drugs)   Current Outpatient Medications (Other)  Medication Sig   acetaminophen (TYLENOL) 325 MG tablet Take 650 mg by mouth every 6 (six) hours as needed for moderate pain.   BD INSULIN SYRINGE U/F 31G X 5/16" 1 ML MISC USE DAILY AS DIRECTED   Continuous Blood Gluc Sensor (FREESTYLE LIBRE SENSOR SYSTEM) MISC USE TO CHECK BLOOD SUGARS 4 TIMES DAILY. Before breakfast, before lunch, before supper, and at bedtime   diclofenac Sodium (VOLTAREN) 1 % GEL Apply 4 g topically 4 (four) times daily.   Dulaglutide (TRULICITY) 3 WI/0.9BD SOPN Inject 3 mg into the skin once a week.   empagliflozin (JARDIANCE) 10 MG TABS tablet Take 1 tablet (10 mg total) by mouth daily. **Need appointment before anymore refills given**   ENTRESTO 97-103 MG TAKE 1 TABLET TWO TIMES A DAY   eplerenone (INSPRA) 25 MG tablet Take 1 tablet (25 mg total) by mouth daily.   furosemide (LASIX) 20 MG tablet Take 1  tablet (20 mg total) by mouth daily as needed for edema (For a weight gain of 3 pounds overnight or 5 pounds in one week).   gabapentin (NEURONTIN) 300 MG capsule TAKE 2 CAPSULES TWICE A DAY (Patient taking differently: Take 1,200 mg by mouth 2 (two) times daily.)   insulin aspart (NOVOLOG FLEXPEN) 100 UNIT/ML FlexPen INJECT 6-8 UNITS UNDER THE SKIN AT THE TIME OF EACH MEAL (Patient taking differently: Pt stated 20 units in the a.m only   UNDER THE SKIN AT THE TIME OF EACH MEAL)   insulin glargine (LANTUS) 100 UNIT/ML injection Inject 0.36-0.4 mLs (36-40 Units total) into the skin daily. INJECT 54 UNITS UNDER THE SKIN DAILY, WILL BE TITRATING DOSE PERIODICALLY (Patient taking differently: Inject 8-18 Units into the skin at bedtime. At bedtime 43 units)   Insulin Pen Needle (SURE COMFORT PEN NEEDLES) 31G X 5 MM MISC USE TO INJECT INSULIN UNDER THE SKIN   lidocaine (LIDODERM) 5 % Place 1 patch onto the skin daily. Remove & Discard patch within 12 hours or as directed by MD (Patient taking differently: Place 1 patch onto the skin daily as needed (pain). Remove & Discard patch within 12 hours or as directed by MD)   meclizine (ANTIVERT) 25 MG tablet TAKE 1 TABLET THREE TIMES A DAY  AS NEEDED FOR DIZZINESS (Patient taking differently: Take 25 mg by mouth 3 (three) times daily.)   metoprolol succinate (TOPROL-XL) 50 MG 24 hr tablet TAKE 1 TABLET DAILY. TAKE WITH OR IMMEDIATELY FOLLOWING A MEAL (Patient taking differently: Take 50 mg by mouth daily.)   omeprazole (PRILOSEC) 40 MG capsule Take 1 capsule (40 mg total) by mouth daily.   oxybutynin (DITROPAN XL) 10 MG 24 hr tablet Take 1 tablet (10 mg total) by mouth at bedtime.   rOPINIRole (REQUIP) 1 MG tablet TAKE 1 TABLET DAILY   rosuvastatin (CRESTOR) 20 MG tablet Take 1 tablet (20 mg total) by mouth daily.   sertraline (ZOLOFT) 100 MG tablet Take 1 tablet (100 mg total) by mouth daily.   XARELTO 20 MG TABS tablet TAKE 1 TABLET DAILY WITH SUPPER (Patient  taking differently: Take 20 mg by mouth daily with supper.)   No current facility-administered medications for this visit. (Other)      REVIEW OF SYSTEMS: ROS   Negative for: Constitutional, Gastrointestinal, Neurological, Skin, Genitourinary, Musculoskeletal, HENT, Endocrine, Cardiovascular, Eyes, Respiratory, Psychiatric, Allergic/Imm, Heme/Lymph Last edited by Silvestre Moment on 09/12/2021  1:13 PM.       ALLERGIES No Known Allergies  PAST MEDICAL HISTORY Past Medical History:  Diagnosis Date   AICD (automatic cardioverter/defibrillator) present 2018   MDT CRT-D.  Fatigue-->completely pacer dependent.  Pacer settings adjusted 12/2017 to allow more chronotrophc variance with ADL's//exertion.   Arthritis    Pt is s/p eft reverse total shoulder arthroplasty.   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   02/2016 EF 40%, diffuse hypokinesis, grade 2 DD.  Myoc perf imaging showed EF 32% 04/2016.  Pt upgraded to CRT-D 01/04/17.   Chronic renal insufficiency, stage III (moderate) (HCC) 2015   CrCl about 60 ml/min   Complete heart block (HCC)    Has dual chamber pacer.   COVID-19 virus infection 01/05/2021   paxlovid   Depression    DOE (dyspnea on exertion)    NYHA class II/III CHF   Dyspnea 2021   with exertion, bending over   Episodic low back pain 01/22/2013   w/intermittent radiculitis (12/2014 his neurologist referred him to pain mgmt for epidural steroid injection)   Erectile dysfunction 2019   due to zoloft--urol rx'd viagra   GERD (gastroesophageal reflux disease)    H/O tilt table evaluation 70/07/7492   negative   Helicobacter pylori gastritis 01/2016   History of adenomatous polyp of colon 10/12/2011   Dr. [name] (3 right side of colon- tubular adenomas removed)   History of cardiovascular stress  test 05/28/2012   no ischemia, EF 37%, imaging results are unchanged and within normal variance   History of chronic prostatitis    History of kidney stones    History of vertigo    + Hx of posterior HA's.  Neuro (Dr. Erling Cruz) eval 2011.  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   Hyperlipidemia    Hypertension    Lumbar spondylosis    lumbosacral radiculopathy at L4 by EMG testing, right foot drop (neurologist is Dr. Linus Salmons with [facility] in [place])--neurologist referred him to neurosurgery   Migraine    "used to have them all the time; none for years" (01/04/2017)   Myocardial infarction [facility]) ?1970s  not entirely certain of this   Nephrolithiasis 07/2012   Left UVJ 2 mm stone with dilation of renal collecting system and slight hydroureter on right   Neuropathy    NICM (nonischemic cardiomyopathy) ([place])    a. 02/2018 Cath: LM nl, LAD min irregs, LCX no, RCA 20d. ENID78. Fick CO/CI 4.4/2.0.   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  Since no CAD on cath 05/2016, cards recommends upgrade to CRT-D.   Permanent atrial fibrillation ([place])    DCCV 07/09/13-converted, lasted two days, then back into afib--needs lifetime anticoagulation (Xarelto as of 09/2014)   Prostate cancer screening 09/2017   done by urol annually (normal prostate exam documented + PSA 0.84 as of 10/01/17 urol f/u.  10/2018 urol f/u PSA 0.6, no prostate nodule.   Rectus diastasis    Right ankle sprain 08/2017   w/distal fibula avulsion fx noted on u/s but not plain film-(Dr. Hudnall).   Skin cancer of arm, left    "burned it off" (01/04/2017)   TIA (transient ischemic attack)    L face and L arm weakness. Peri procedural->a. 03/22/2018 following cath. CT head neg. No MRI b/c has pacer. Likely due to embolus to distal branch of RMCA   Type II diabetes mellitus ([place])    Past  Surgical History:  Procedure Laterality Date   ABI's Bilateral 05/21/2018   normal   BACK SURGERY     BIV ICD INSERTION CRT-D N/A 01/04/2017   Procedure: BiV ICD ;  Surgeon: Constance Haw, MD;  Location: [facility];  Service: Cardiovascular;  Laterality: N/A;   CARDIAC CATHETERIZATION N/A 06/14/2016   Minimal nonobstructive dz, EF 25-35%.  Procedure: Left Heart Cath and Coronary Angiography;  Surgeon: Peter M Martinique, MD;  Location: [facility];  Service: Cardiovascular;  Laterality: N/A;   CARDIOVASCULAR STRESS TEST  2012   2012 nuclear perfusion study: low risk scan; 04/2016 normal myocardial perfusion imaging, EF 32%.   CARDIOVERSION  07/09/2012   Procedure: CARDIOVERSION;  Surgeon: [name], MD;  Location: [facility];  Service: Cardiovascular;  Laterality: N/A;   CATARACT EXTRACTION W/ INTRAOCULAR LENS IMPLANT & ANTERIOR VITRECTOMY, BILATERAL Bilateral    CIRCUMCISION N/A 03/23/2020   Procedure: CIRCUMCISION ADULT;  Surgeon: Remi Haggard, MD;  Location: [facility];  Service: Urology;  Laterality: N/A;   COLONOSCOPY W/ POLYPECTOMY  approx 2006; repeated 09/2011   Polyps on 2013 EGD as well, repeat 12/2014   COLONOSCOPY WITH PROPOFOL N/A 07/08/2021   adenoma x 1. Procedure: COLONOSCOPY WITH PROPOFOL;  Surgeon: Carol Ada, MD;  Location: [facility];  Service: Endoscopy;  Laterality: N/A;   ESOPHAGOGASTRODUODENOSCOPY  10/18/2006   Done due to chronic GERD: Normal, bx showed no barrett's esophagus (Dr. [name])   [place] Left 10/02/2016   Procedure: LEFT RING FINGER WOUND EXPORATION AND FLEXOR TENDON REPAIR AND NERVE REPAIR;  Surgeon: Milly Jakob, MD;  Location: [facility];  Service: Orthopedics;  Laterality: Left;   INSERT / REPLACE / REMOVE PACEMAKER  02/05/2012   dual chamber, sinus node dysfunction, sinus arrest, PAF, Medtronic Revo serial#-[id number] H: last checked 05/2015   LUMBAR LAMINECTOMY Left 1976   L4-5   PACEMAKER REMOVAL  01/04/2017    PERMANENT PACEMAKER INSERTION N/A 02/05/2012   Procedure: PERMANENT PACEMAKER INSERTION;  Surgeon: [name], MD; Generator Medtronic Triumph model [place] serial number [id number] H Laterality: N/A;   POLYPECTOMY  07/08/2021   Procedure: POLYPECTOMY;  Surgeon: Carol Ada, MD;  Location: [facility];  Service: Endoscopy;;   RETINAL DETACHMENT SURGERY Left ~ [place] Left 2018   Left shoulder reverse TSA ([facility] in [place]).   RIGHT/LEFT HEART CATH AND CORONARY ANGIOGRAPHY N/A 03/22/2018   EF 30-35%, no CAD.  Procedure: RIGHT/LEFT HEART CATH AND CORONARY ANGIOGRAPHY;  Surgeon: Jolaine Artist, MD;  Location: [facility];  Service: Cardiovascular;  Laterality: N/A;   TRANSTHORACIC ECHOCARDIOGRAM  08/25/10; 05/2012; 03/23/16;12/2017   mild asymmetric LVH, normal systolic function, normal diastolic fxn, mild-to-mod mitral regurg, mild aortic valve sclerosis and trace AI, mild aortic root dilatation. 2014 f/u showed EF 40-45%, mod LAE, A FIB.  02/2016 EF 40%, diffuse hypokinesis, grade 2 DD. 12/2017 EF 35-40%,diffuse hypokin,grd III DD, mild MR   WISDOM TOOTH EXTRACTION  06/27/2021   EF 50-55%, mild aortic root/ascending aorta dilatation (41-42 mm).    FAMILY HISTORY Family History  Problem Relation Age of Onset   Heart failure Mother    Stroke Mother    Stroke Father    Heart disease Sister    Heart disease Sister    Cancer Sister        liver   Cancer Brother        lung   Cancer Brother        lung   Heart disease Brother     SOCIAL HISTORY Social History   Tobacco Use   Smoking status: Never   Smokeless tobacco: Never  Vaping Use   Vaping Use: Never used  Substance Use Topics   Alcohol use: Yes    Comment: occ   Drug use: No         OPHTHALMIC EXAM:  Base Eye Exam     Visual Acuity (Snellen - Linear)       Right Left   Dist [facility] 20/25 -3 20/40 -3         Tonometry (Tonopen, 1:19 PM)       Right Left   Pressure  14 10         Pupils       Pupils   Right PERRL   Left PERRL         Visual Fields       Left Right    Full Full         Extraocular Movement       Right Left    Full, Ortho Full, Ortho         Neuro/Psych     Oriented x3: Yes   Mood/Affect: Normal         Dilation     Both eyes: 1.0% Mydriacyl, 2.5% Phenylephrine @ 1:19 PM           Slit Lamp and Fundus Exam     External Exam       Right Left   External Normal Normal         Slit Lamp Exam       Right Left   Lids/Lashes Normal Normal   Conjunctiva/Sclera White and quiet White and quiet   Cornea Clear Clear   Anterior Chamber Deep and quiet Deep,,  Cell, no fibrin, trace pigment, no cell, no flare   Iris Round and reactive Round and reactive   Lens Posterior chamber intraocular lens Posterior chamber intraocular lens in the bag, with Pseudophakodonesis present, lens slightly tilted loose temporally, in upright position still visual axis functional   Anterior Vitreous Normal Pigment and trace cells  Fundus Exam       Right Left   Posterior Vitreous Posterior vitreous detachment Posterior vitreous detachment, Central vitreous floaters, 1+ cell, RBCs inferiorly, no new blood   Disc Normal Normal   C/D Ratio 0.65 0.7   Macula Normal No topographic distortion   Vessels Normal, , no DR Normal, , no DR   Periphery Normal Good retinopexy temporally, good buckle            IMAGING AND PROCEDURES  Imaging and Procedures for 09/12/21  OCT, Retina - OU - Both Eyes       Right Eye Quality was good. Central Foveal Thickness: 322. Progression has been stable. Findings include normal foveal contour, retinal drusen , no IRF, no SRF.   Left Eye Quality was good. Scan locations included subfoveal. Central Foveal Thickness: 294. Progression has been stable. Findings include epiretinal membrane, abnormal foveal contour, retinal drusen , no IRF, no SRF.   Notes OS with minor  epiretinal membrane temporal to the fovea, with no foveal distortion will observe.  Minor to moderate vitreous debris left eye  OD with drusen and drusenoid deposits subfoveal.  No signs of active CNVM     Color Fundus Photography Optos - OU - Both Eyes       Right Eye Progression has been stable. Macula : normal observations.   Left Eye Progression has improved. Disc findings include increased cup to disc ratio, pallor. Macula : drusen, retinal pigment epithelium abnormalities.   Notes Old retinopexy temporally left eye, retina attached.             ASSESSMENT/PLAN:  Intermediate stage nonexudative age-related macular degeneration of both eyes OU stable over time  Pseudophakodonesis OS bag laxity continues yet still with acceptable visual acuity left eye.  I explained the patient that this lens while still on the bag, has poor zonules support and is likely to spontaneously dislocate 1 day at which time lens exchange with vitrectomy removal of lens and likely [place]ane scleral tunnel fixation of posterior lens would be appropriate.  Posterior vitreous detachment of right eye OD stable  Posterior vitreous detachment of left eye OS stable  History of retinal detachment OS retina attached stable     ICD-10-CM   1. Left epiretinal membrane  H35.372 OCT, Retina - OU - Both Eyes    Color Fundus Photography Optos - OU - Both Eyes    2. Intermediate stage nonexudative age-related macular degeneration of both eyes  H35.3132 OCT, Retina - OU - Both Eyes    Color Fundus Photography Optos - OU - Both Eyes    3. Pseudophakodonesis, subsequent encounter  T85.29XD     4. Posterior vitreous detachment of right eye  H43.811     5. Posterior vitreous detachment of left eye  H43.812     6. History of retinal detachment  Z86.69       OS Pseudophakodonesis, no change over time likely to have spontaneous dislocation.  Reassured the patient that surgical repair is appropriate and  proper at that time.  2.  OU with intermediate ARMD not progressive at this time  3.  OS with minor epiretinal membrane not visual acuity threatened  4.  History of retinal detachment OS stable    Ophthalmic Meds Ordered this visit:  No orders of the defined types were placed in this encounter.      Return in about 6 months (around 03/12/2022) for DILATE OU, COLOR FP, OCT.  There are no Patient Instructions on  file for this visit.   Explained the diagnoses, plan, and follow up with the patient and they expressed understanding.  Patient expressed understanding of the importance of proper follow up care.   Clent Demark Vadhir Mcnay M.D. Diseases & Surgery of the Retina and Vitreous Retina [facility] Diabetic [place] 09/12/21     Abbreviations: M myopia (nearsighted); A astigmatism; H hyperopia (farsighted); P presbyopia; Mrx spectacle prescription;  CTL contact lenses; OD right eye; OS left eye; OU both eyes  XT exotropia; ET esotropia; PEK punctate epithelial keratitis; PEE punctate epithelial erosions; DES dry eye syndrome; MGD meibomian gland dysfunction; ATs artificial tears; PFAT's preservative free artificial tears; [place] nuclear sclerotic cataract; PSC posterior subcapsular cataract; ERM epi-retinal membrane; PVD posterior vitreous detachment; RD retinal detachment; DM diabetes mellitus; DR diabetic retinopathy; NPDR non-proliferative diabetic retinopathy; PDR proliferative diabetic retinopathy; CSME clinically significant macular edema; DME diabetic macular edema; dbh dot blot hemorrhages; CWS cotton wool spot; POAG primary open angle glaucoma; C/D cup-to-disc ratio; HVF humphrey visual field; GVF goldmann visual field; OCT optical coherence tomography; IOP intraocular pressure; BRVO Branch retinal vein occlusion; CRVO central retinal vein occlusion; CRAO central retinal artery occlusion; BRAO branch retinal artery occlusion; RT retinal tear; SB scleral buckle; PPV pars plana vitrectomy; VH  Vitreous hemorrhage; PRP panretinal laser photocoagulation; IVK intravitreal kenalog; VMT vitreomacular traction; MH Macular hole;  NVD neovascularization of the disc; NVE neovascularization elsewhere; AREDS age related eye disease study; ARMD age related macular degeneration; POAG primary open angle glaucoma; EBMD epithelial/anterior basement membrane dystrophy; ACIOL anterior chamber intraocular lens; IOL intraocular lens; PCIOL posterior chamber intraocular lens; Phaco/IOL phacoemulsification with intraocular lens placement; [place] photorefractive keratectomy; LASIK laser assisted in situ keratomileusis; HTN hypertension; DM diabetes mellitus; COPD chronic obstructive pulmonary disease

## 2021-09-23 DIAGNOSIS — N401 Enlarged prostate with lower urinary tract symptoms: Secondary | ICD-10-CM | POA: Diagnosis not present

## 2021-09-23 DIAGNOSIS — Z125 Encounter for screening for malignant neoplasm of prostate: Secondary | ICD-10-CM | POA: Diagnosis not present

## 2021-09-23 DIAGNOSIS — R3915 Urgency of urination: Secondary | ICD-10-CM | POA: Diagnosis not present

## 2021-10-04 ENCOUNTER — Other Ambulatory Visit: Payer: Self-pay | Admitting: Family Medicine

## 2021-10-04 DIAGNOSIS — H401123 Primary open-angle glaucoma, left eye, severe stage: Secondary | ICD-10-CM | POA: Diagnosis not present

## 2021-10-04 DIAGNOSIS — H401112 Primary open-angle glaucoma, right eye, moderate stage: Secondary | ICD-10-CM | POA: Diagnosis not present

## 2021-10-05 MED ORDER — LIDOCAINE 5 % EX PTCH: 1.0000 | MEDICATED_PATCH | CUTANEOUS | 0 refills | Status: DC

## 2021-10-10 ENCOUNTER — Encounter: Payer: Self-pay | Admitting: Family Medicine

## 2021-10-10 ENCOUNTER — Ambulatory Visit (INDEPENDENT_AMBULATORY_CARE_PROVIDER_SITE_OTHER): Payer: Medicare Other | Admitting: Family Medicine

## 2021-10-10 VITALS — BP 116/79 | HR 73 | Temp 98.7°F | Ht 71.0 in | Wt 232.0 lb

## 2021-10-10 DIAGNOSIS — S39012A Strain of muscle, fascia and tendon of lower back, initial encounter: Secondary | ICD-10-CM | POA: Diagnosis not present

## 2021-10-10 DIAGNOSIS — S161XXA Strain of muscle, fascia and tendon at neck level, initial encounter: Secondary | ICD-10-CM

## 2021-10-10 NOTE — Progress Notes (Signed)
OFFICE VISIT ? ?10/10/2021 ? ?CC:  ?Chief Complaint  ?Patient presents with  ? Back Pain  ?  Flank pain  ? ?Patient is a 81 y.o. male who presents for right side pain and back pain. ? ?HPI: ?About 3 weeks ago he was sitting on his bed and he was leaning over to get something and fell off balance and hit his neck on something and then the rest of his body jarred onto the floor.  He was able to get up and was okay for the most part but has continued to have some pain when he turns his neck as well as some pain in the right low back that radiates also around the right lower rib cage area into the right lower abdomen.  Sometimes the pain is gone and sometimes it comes back.  Moderate intensity.  Today does not hurt at all. ?Tried Vicodin a couple of times and it did not help. ?Denies any weakness, tingling, numbness, or radiating pain into the arms or legs. ? ?No bruising, no HA, no vision changes, no dizziness.  No rash, no fever. ? ? ?Past Medical History:  ?Diagnosis Date  ? AICD (automatic cardioverter/defibrillator) present 2018  ? MDT CRT-D.  Fatigue-->completely pacer dependent.  Pacer settings adjusted 12/2017 to allow more chronotrophc variance with ADL's//exertion.  ? Arthritis   ? Pt is s/p eft reverse total shoulder arthroplasty.  ? 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 Chronic combined systolic and diastolic heart failure ([place]) 05/31/2012  ? Nonischemic:  EF 40-45%, LA mod-severe dilated, AFIB.   02/2016 EF 40%, diffuse hypokinesis, grade 2 DD.  Myoc perf imaging showed EF 32% 04/2016.  Pt upgraded to CRT-D 01/04/17.  ? Chronic renal insufficiency, stage III (moderate) ([place]) 2015  ? CrCl about 60 ml/min  ? Complete heart block ([place])   ? Has dual chamber pacer.  ? COVID-19 virus infection 01/05/2021  ? paxlovid  ? Depression   ? DOE (dyspnea on exertion)   ? NYHA class II/III CHF  ? Dyspnea 2021  ? with exertion, bending over  ? Episodic low back pain  01/22/2013  ? w/intermittent radiculitis (12/2014 his neurologist referred him to pain mgmt for epidural steroid injection)  ? Erectile dysfunction 2019  ? due to zoloft--urol rx'd viagra  ? GERD (gastroesophageal reflux disease)   ? H/O tilt table evaluation 11/02/2005  ? negative  ? Helicobacter pylori gastritis 01/2016  ? History of adenomatous polyp of colon 10/12/2011  ? Dr. [name] (3 right side of colon- tubular adenomas removed)  ? History of cardiovascular stress test 05/28/2012  ? no ischemia, EF 37%, imaging results are unchanged and within normal variance  ? History of chronic prostatitis   ? History of kidney stones   ? History of vertigo   ? + Hx of posterior HA's.  Neuro (Dr. Erling Cruz) eval 2011.  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  ? Hyperlipidemia   ? Hypertension   ? Lumbar spondylosis   ? lumbosacral radiculopathy at L4 by EMG testing, right foot drop (neurologist is Dr. Linus Salmons with [facility] in [place])--neurologist referred him to neurosurgery  ? Migraine   ? "used to have them all the time; none for years" (01/04/2017)  ? 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 not entirely certain of this  ? Nephrolithiasis 07/2012  ? Left UVJ 2 mm stone with dilation  of renal collecting system and slight hydroureter on right  ? Neuropathy   ? NICM (nonischemic cardiomyopathy) ([place])   ? a. 02/2018 Cath: LM nl, LAD min irregs, LCX no, RCA 20d. [id number]. Fick CO/CI 4.4/2.0.  ? Pacemaker 02/05/2012  ? dual chamber, complete heart block, meddtronic revo, lasted checked 12/2015.  Since no CAD on cath 05/2016, cards recommends upgrade to CRT-D.  ? Permanent atrial fibrillation ([place])   ? DCCV 07/09/13-converted, lasted two days, then back into afib--needs lifetime anticoagulation (Xarelto as of 09/2014)  ? Prostate cancer screening 09/2017  ? done by urol annually (normal prostate exam documented + PSA 0.84 as of 10/01/17 urol f/u.   10/2018 urol f/u PSA 0.6, no prostate nodule.  ? Rectus diastasis   ? Right ankle sprain 08/2017  ? w/distal fibula avulsion fx noted on u/s but not plain film-(Dr. Hudnall).  ? Skin cancer of arm, left   ? "burned it off" (01/04/2017)  ? TIA (transient ischemic attack)   ? L face and L arm weakness. Peri procedural->a. 03/22/2018 following cath. CT head neg. No MRI b/c has pacer. Likely due to embolus to distal branch of RMCA  ? Type II diabetes mellitus ([place])   ? ? ?Past Surgical History:  ?Procedure Laterality Date  ? ABI's Bilateral 05/21/2018  ? normal  ? BACK SURGERY    ? BIV ICD INSERTION CRT-D N/A 01/04/2017  ? Procedure: BiV ICD ;  Surgeon: Constance Haw, MD;  Location: [place] [facility];  Service: Cardiovascular;  Laterality: N/A;  ? CARDIAC CATHETERIZATION N/A 06/14/2016  ? Minimal nonobstructive dz, EF 25-35%.  Procedure: Left Heart Cath and Coronary Angiography;  Surgeon: Peter M Martinique, MD;  Location: [place] [facility];  Service: Cardiovascular;  Laterality: N/A;  ? CARDIOVASCULAR STRESS TEST  2012  ? 2012 nuclear perfusion study: low risk scan; 04/2016 normal myocardial perfusion imaging, EF 32%.  ? CARDIOVERSION  07/09/2012  ? Procedure: CARDIOVERSION;  Surgeon: [name], MD;  Location: [place] [facility];  Service: Cardiovascular;  Laterality: N/A;  ? CATARACT EXTRACTION W/ INTRAOCULAR LENS IMPLANT & ANTERIOR VITRECTOMY, BILATERAL Bilateral   ? CIRCUMCISION N/A 03/23/2020  ? Procedure: CIRCUMCISION ADULT;  Surgeon: Remi Haggard, MD;  Location: [facility];  Service: Urology;  Laterality: N/A;  ? COLONOSCOPY W/ POLYPECTOMY  approx 2006; repeated 09/2011  ? Polyps on 2013 EGD as well, repeat 12/2014  ? COLONOSCOPY WITH PROPOFOL N/A 07/08/2021  ? adenoma x 1. Procedure: COLONOSCOPY WITH PROPOFOL;  Surgeon: Carol Ada, MD;  [facility];  Service: Endoscopy;  Laterality: N/A;  ? ESOPHAGOGASTRODUODENOSCOPY  10/18/2006  ? Done due to chronic GERD: Normal, bx showed no barrett's esophagus  (Dr. [name])  ? FLEXOR TENDON REPAIR Left 10/02/2016  ? Procedure: LEFT RING FINGER WOUND EXPORATION AND FLEXOR TENDON REPAIR AND NERVE REPAIR;  Surgeon: Milly Jakob, MD;  [facility];  Service: Orthopedics;  Laterality: Left;  ? INSERT / REPLACE / REMOVE PACEMAKER  02/05/2012  ? dual chamber, sinus node dysfunction, sinus arrest, PAF, Medtronic Revo serial#-[id number] H: last checked 05/2015  ? LUMBAR LAMINECTOMY Left 1976  ? L4-5  ? PACEMAKER REMOVAL  01/04/2017  ? PERMANENT PACEMAKER INSERTION N/A 02/05/2012  ? Procedure: PERMANENT PACEMAKER INSERTION;  Surgeon: [name], MD; Generator Medtronic [place] model [place] serial number [id number] H Laterality: N/A;  ? POLYPECTOMY  07/08/2021  ? Procedure: POLYPECTOMY;  Surgeon: Carol Ada, MD;  [facility];  Service: Endoscopy;;  ? RETINAL DETACHMENT SURGERY Left ~ 1999  ? REVERSE  SHOULDER ARTHROPLASTY Left 2018  ? Left shoulder reverse [name] [place]).  ? RIGHT/LEFT HEART CATH AND CORONARY ANGIOGRAPHY N/A 03/22/2018  ? EF 30-35%, no CAD.  Procedure: RIGHT/LEFT HEART CATH AND CORONARY ANGIOGRAPHY;  Surgeon: Jolaine Artist, MD;  Location: [place] [facility];  Service: Cardiovascular;  Laterality: N/A;  ? TRANSTHORACIC ECHOCARDIOGRAM  08/25/10; 05/2012; 03/23/16;12/2017  ? mild asymmetric LVH, normal systolic function, normal diastolic fxn, mild-to-mod mitral regurg, mild aortic valve sclerosis and trace AI, mild aortic root dilatation. 2014 f/u showed EF 40-45%, mod LAE, A FIB.  02/2016 EF 40%, diffuse hypokinesis, grade 2 DD. 12/2017 EF 35-40%,diffuse hypokin,grd III DD, mild MR  ? WISDOM TOOTH EXTRACTION  06/27/2021  ? EF 50-55%, mild aortic root/ascending aorta dilatation (41-42 mm).  ? ? ?Outpatient Medications Prior to Visit  ?Medication Sig Dispense Refill  ? acetaminophen (TYLENOL) 325 MG tablet Take 650 mg by mouth every 6 (six) hours as needed for moderate pain.    ? COMBIGAN 0.2-0.5 % ophthalmic solution Place 1 drop into  both eyes at bedtime.     ? Continuous Blood Gluc Sensor (FREESTYLE LIBRE SENSOR SYSTEM) MISC USE TO CHECK BLOOD SUGARS 4 TIMES DAILY. Before breakfast, before lunch, before supper, and at bedtime 1 each 3  ?

## 2021-10-19 DIAGNOSIS — M542 Cervicalgia: Secondary | ICD-10-CM | POA: Diagnosis not present

## 2021-10-26 ENCOUNTER — Ambulatory Visit (INDEPENDENT_AMBULATORY_CARE_PROVIDER_SITE_OTHER): Payer: Medicare Other

## 2021-10-26 DIAGNOSIS — I442 Atrioventricular block, complete: Secondary | ICD-10-CM

## 2021-10-26 DIAGNOSIS — M542 Cervicalgia: Secondary | ICD-10-CM | POA: Diagnosis not present

## 2021-10-31 LAB — CUP PACEART REMOTE DEVICE CHECK
Battery Remaining Longevity: 26 mo
Battery Voltage: 2.94 V
Brady Statistic AP VP Percent: 0 %
Brady Statistic AP VS Percent: 0 %
Brady Statistic AS VP Percent: 0 %
Brady Statistic AS VS Percent: 0 %
Brady Statistic RA Percent Paced: 0 %
Brady Statistic RV Percent Paced: 99.68 %
Date Time Interrogation Session: 20230410012503
HighPow Impedance: 71 Ohm
Implantable Lead Implant Date: 20130715
Implantable Lead Implant Date: 20180614
Implantable Lead Implant Date: 20180614
Implantable Lead Location: 753858
Implantable Lead Location: 753859
Implantable Lead Location: 753860
Implantable Lead Model: 4598
Implantable Pulse Generator Implant Date: 20180614
Lead Channel Impedance Value: 188.1 Ohm
Lead Channel Impedance Value: 188.1 Ohm
Lead Channel Impedance Value: 208.174
Lead Channel Impedance Value: 234.08 Ohm
Lead Channel Impedance Value: 234.08 Ohm
Lead Channel Impedance Value: 342 Ohm
Lead Channel Impedance Value: 361 Ohm
Lead Channel Impedance Value: 418 Ohm
Lead Channel Impedance Value: 418 Ohm
Lead Channel Impedance Value: 456 Ohm
Lead Channel Impedance Value: 513 Ohm
Lead Channel Impedance Value: 532 Ohm
Lead Channel Impedance Value: 532 Ohm
Lead Channel Impedance Value: 665 Ohm
Lead Channel Impedance Value: 665 Ohm
Lead Channel Impedance Value: 817 Ohm
Lead Channel Impedance Value: 836 Ohm
Lead Channel Impedance Value: 836 Ohm
Lead Channel Pacing Threshold Amplitude: 0.5 V
Lead Channel Pacing Threshold Amplitude: 0.5 V
Lead Channel Pacing Threshold Pulse Width: 0.4 ms
Lead Channel Pacing Threshold Pulse Width: 1 ms
Lead Channel Sensing Intrinsic Amplitude: 0.625 mV
Lead Channel Sensing Intrinsic Amplitude: 12.625 mV
Lead Channel Sensing Intrinsic Amplitude: 12.625 mV
Lead Channel Setting Pacing Amplitude: 1 V
Lead Channel Setting Pacing Amplitude: 2 V
Lead Channel Setting Pacing Pulse Width: 0.4 ms
Lead Channel Setting Pacing Pulse Width: 1 ms
Lead Channel Setting Sensing Sensitivity: 0.3 mV

## 2021-11-01 DIAGNOSIS — M542 Cervicalgia: Secondary | ICD-10-CM | POA: Diagnosis not present

## 2021-11-07 ENCOUNTER — Ambulatory Visit (INDEPENDENT_AMBULATORY_CARE_PROVIDER_SITE_OTHER): Payer: Medicare Other | Admitting: Family Medicine

## 2021-11-07 ENCOUNTER — Encounter: Payer: Self-pay | Admitting: Family Medicine

## 2021-11-07 VITALS — BP 89/60 | HR 73 | Temp 98.4°F | Ht 71.0 in | Wt 232.8 lb

## 2021-11-07 DIAGNOSIS — R0781 Pleurodynia: Secondary | ICD-10-CM | POA: Diagnosis not present

## 2021-11-07 NOTE — Progress Notes (Signed)
OFFICE VISIT ? ?11/07/2021 ? ?CC:  ?Chief Complaint  ?Patient presents with  ? Right side pain  ?  1-2 months, tried using heat and ice. Starts on R side and travels to umbilical region  ? ?Patient is a 80 y.o. male who presents for pain in the right side. ? ?HPI: ?Says he is having pain around the right side lowest rib posterolaterally for 4 to 5 weeks or so.  No numbness or tingling in the area ?He cannot recall but thinks maybe he fell prior to the onset.  He has been coughing as well.  Denies shortness of breath.  No fever. ?No abdominal pain.  General movements of the torso often trigger a brief sharp radiation of the pain around his right side to the right upper abdomen.  The pain is not of a burning nature.  He has no rash.  No pain over the mid spine.  No blood in urine. ? ?He is mainly concerned that this might be emanating from a problem with his kidney. ?He has a remote history of kidney stones/ureterolithiasis. ? ?ROS as above, plus--> no exertional CP, no SOB, no wheezing,no dizziness, no HAs, no rashes, no melena/hematochezia.  No polyuria or polydipsia.  No focal weakness, paresthesias, or tremors.  No acute vision or hearing abnormalities.  No dysuria or unusual/new urinary urgency or frequency.  No recent changes in lower legs. ?No n/v/d or abd pain.  No palpitations.   ? ?Past Medical History:  ?Diagnosis Date  ? AICD (automatic cardioverter/defibrillator) present 2018  ? MDT CRT-D.  Fatigue-->completely pacer dependent.  Pacer settings adjusted 12/2017 to allow more chronotrophc variance with ADL's//exertion.  ? Arthritis   ? Pt is s/p eft reverse total shoulder arthroplasty.  ? 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 Chronic combined systolic and diastolic heart failure ([place]) 05/31/2012  ? Nonischemic:  EF 40-45%, LA mod-severe dilated, AFIB.   02/2016 EF 40%, diffuse hypokinesis, grade 2 DD.  Myoc perf imaging showed EF 32% 04/2016.  Pt  upgraded to CRT-D 01/04/17.  ? Chronic renal insufficiency, stage III (moderate) ([place]) 2015  ? CrCl about 60 ml/min  ? Complete heart block ([place])   ? Has dual chamber pacer.  ? COVID-19 virus infection 01/05/2021  ? paxlovid  ? Depression   ? DOE (dyspnea on exertion)   ? NYHA class II/III CHF  ? Dyspnea 2021  ? with exertion, bending over  ? Episodic low back pain 01/22/2013  ? w/intermittent radiculitis (12/2014 his neurologist referred him to pain mgmt for epidural steroid injection)  ? Erectile dysfunction 2019  ? due to zoloft--urol rx'd viagra  ? GERD (gastroesophageal reflux disease)   ? H/O tilt table evaluation 11/02/2005  ? negative  ? Helicobacter pylori gastritis 01/2016  ? History of adenomatous polyp of colon 10/12/2011  ? Dr. [name] (3 right side of colon- tubular adenomas removed)  ? History of cardiovascular stress test 05/28/2012  ? no ischemia, EF 37%, imaging results are unchanged and within normal variance  ? History of chronic prostatitis   ? History of kidney stones   ? History of vertigo   ? + Hx of posterior HA's.  Neuro (Dr. Erling Cruz) eval 2011.  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  ? Hyperlipidemia   ? Hypertension   ? Lumbar spondylosis   ? lumbosacral radiculopathy at L4 by EMG testing, right foot  drop (neurologist is Dr. Linus Salmons with [facility] in [place])--neurologist referred him to neurosurgery  ? Migraine   ? "used to have them all the time; none for years" (01/04/2017)  ? Myocardial infarction [facility]) ?1970s  ? not entirely certain of this  ? Nephrolithiasis 07/2012  ? Left UVJ 2 mm stone with dilation of renal collecting system and slight hydroureter on right  ? Neuropathy   ? NICM (nonischemic cardiomyopathy) ([place])   ? a. 02/2018 Cath: LM nl, LAD min irregs, LCX no, RCA 20d. [id number]. Fick CO/CI 4.4/2.0.  ? Pacemaker 02/05/2012  ? dual chamber, complete heart block, meddtronic revo, lasted checked  12/2015.  Since no CAD on cath 05/2016, cards recommends upgrade to CRT-D.  ? Permanent atrial fibrillation ([place])   ? DCCV 07/09/13-converted, lasted two days, then back into afib--needs lifetime anticoagulation (Xarelto as of 09/2014)  ? Prostate cancer screening 09/2017  ? done by urol annually (normal prostate exam documented + PSA 0.84 as of 10/01/17 urol f/u.  10/2018 urol f/u PSA 0.6, no prostate nodule.  ? Rectus diastasis   ? Right ankle sprain 08/2017  ? w/distal fibula avulsion fx noted on u/s but not plain film-(Dr. Hudnall).  ? Skin cancer of arm, left   ? "burned it off" (01/04/2017)  ? TIA (transient ischemic attack)   ? L face and L arm weakness. Peri procedural->a. 03/22/2018 following cath. CT head neg. No MRI b/c has pacer. Likely due to embolus to distal branch of RMCA  ? Type II diabetes mellitus ([place])   ? ? ?Past Surgical History:  ?Procedure Laterality Date  ? ABI's Bilateral 05/21/2018  ? normal  ? BACK SURGERY    ? BIV ICD INSERTION CRT-D N/A 01/04/2017  ? Procedure: BiV ICD ;  Surgeon: Constance Haw, MD;  Location: [place] [facility];  Service: Cardiovascular;  Laterality: N/A;  ? CARDIAC CATHETERIZATION N/A 06/14/2016  ? Minimal nonobstructive dz, EF 25-35%.  Procedure: Left Heart Cath and Coronary Angiography;  Surgeon: Peter M Martinique, MD;  Location: [place] [facility];  Service: Cardiovascular;  Laterality: N/A;  ? CARDIOVASCULAR STRESS TEST  2012  ? 2012 nuclear perfusion study: low risk scan; 04/2016 normal myocardial perfusion imaging, EF 32%.  ? CARDIOVERSION  07/09/2012  ? Procedure: CARDIOVERSION;  Surgeon: [name], MD;  Location: [place] [facility];  Service: Cardiovascular;  Laterality: N/A;  ? CATARACT EXTRACTION W/ INTRAOCULAR LENS IMPLANT & ANTERIOR VITRECTOMY, BILATERAL Bilateral   ? CIRCUMCISION N/A 03/23/2020  ? Procedure: CIRCUMCISION ADULT;  Surgeon: Remi Haggard, MD;  [facility];  Service: Urology;  Laterality: N/A;  ? COLONOSCOPY W/ POLYPECTOMY  approx 2006;  repeated 09/2011  ? Polyps on 2013 EGD as well, repeat 12/2014  ? COLONOSCOPY WITH PROPOFOL N/A 07/08/2021  ? adenoma x 1. Procedure: COLONOSCOPY WITH PROPOFOL;  Surgeon: Carol Ada, MD;  [facility];  Service: Endoscopy;  Laterality: N/A;  ? ESOPHAGOGASTRODUODENOSCOPY  10/18/2006  ? Done due to chronic GERD: Normal, bx showed no barrett's esophagus (Dr. [name])  ? FLEXOR TENDON REPAIR Left 10/02/2016  ? Procedure: LEFT RING FINGER WOUND EXPORATION AND FLEXOR TENDON REPAIR AND NERVE REPAIR;  Surgeon: Milly Jakob, MD;  Location: [place];  Service: Orthopedics;  Laterality: Left;  ? INSERT / REPLACE / REMOVE PACEMAKER  02/05/2012  ? dual chamber, sinus node dysfunction, sinus arrest, PAF, Medtronic Revo serial#-[id number] H: last checked 05/2015  ? LUMBAR LAMINECTOMY Left 1976  ? L4-5  ? PACEMAKER REMOVAL  01/04/2017  ?  PERMANENT PACEMAKER INSERTION N/A 02/05/2012  ? Procedure: PERMANENT PACEMAKER INSERTION;  Surgeon: [name], MD; Generator Medtronic [place] model [place] serial number [id number] H Laterality: N/A;  ? POLYPECTOMY  07/08/2021  ? Procedure: POLYPECTOMY;  Surgeon: Carol Ada, MD;  [facility];  Service: Endoscopy;;  ? RETINAL DETACHMENT SURGERY Left ~ 1999  ? REVERSE SHOULDER ARTHROPLASTY Left 2018  ? Left shoulder reverse [name] [place]  ? RIGHT/LEFT HEART CATH AND CORONARY ANGIOGRAPHY N/A 03/22/2018  ? EF 30-35%, no CAD.  Procedure: RIGHT/LEFT HEART CATH AND CORONARY ANGIOGRAPHY;  Surgeon: Jolaine Artist, MD;  Location: [place] [facility];  Service: Cardiovascular;  Laterality: N/A;  ? TRANSTHORACIC ECHOCARDIOGRAM  08/25/10; 05/2012; 03/23/16;12/2017  ? mild asymmetric LVH, normal systolic function, normal diastolic fxn, mild-to-mod mitral regurg, mild aortic valve sclerosis and trace AI, mild aortic root dilatation. 2014 f/u showed EF 40-45%, mod LAE, A FIB.  02/2016 EF 40%, diffuse hypokinesis, grade 2 DD. 12/2017 EF 35-40%,diffuse hypokin,grd III DD, mild  MR  ? WISDOM TOOTH EXTRACTION  06/27/2021  ? EF 50-55%, mild aortic root/ascending aorta dilatation (41-42 mm).  ? ? ?Outpatient Medications Prior to Visit  ?Medication Sig Dispense Refill  ? BD INSULIN SYRI

## 2021-11-08 ENCOUNTER — Ambulatory Visit (HOSPITAL_BASED_OUTPATIENT_CLINIC_OR_DEPARTMENT_OTHER)
Admission: RE | Admit: 2021-11-08 | Discharge: 2021-11-08 | Disposition: A | Discharge status: Home / Self Care | Payer: Medicare Other | Source: Ambulatory Visit | Attending: Family Medicine | Admitting: Family Medicine

## 2021-11-08 DIAGNOSIS — R0781 Pleurodynia: Secondary | ICD-10-CM | POA: Diagnosis not present

## 2021-11-08 DIAGNOSIS — M954 Acquired deformity of chest and rib: Secondary | ICD-10-CM | POA: Diagnosis not present

## 2021-11-09 DIAGNOSIS — M542 Cervicalgia: Secondary | ICD-10-CM | POA: Diagnosis not present

## 2021-11-10 NOTE — Progress Notes (Signed)
Remote ICD transmission.   

## 2021-11-11 ENCOUNTER — Telehealth: Payer: Self-pay

## 2021-11-11 NOTE — Telephone Encounter (Signed)
LM for pt to return call regarding results.  

## 2021-11-11 NOTE — Telephone Encounter (Signed)
Patient called regarding xray results. ? ?Please call [phone] ?

## 2021-11-11 NOTE — Telephone Encounter (Signed)
Pt completed imaging for DG Ribs Unilateral Right. ? ?Please review and advise ? ?

## 2021-11-11 NOTE — Telephone Encounter (Signed)
Okay see result note sent. ?

## 2021-11-11 NOTE — Telephone Encounter (Signed)
Spoke with pt regarding results.  

## 2021-11-21 DIAGNOSIS — I7121 Aneurysm of the ascending aorta, without rupture: Secondary | ICD-10-CM

## 2021-11-21 HISTORY — DX: Aneurysm of the ascending aorta, without rupture: I71.21

## 2021-11-24 ENCOUNTER — Encounter: Payer: Self-pay | Admitting: Family Medicine

## 2021-11-24 ENCOUNTER — Ambulatory Visit (INDEPENDENT_AMBULATORY_CARE_PROVIDER_SITE_OTHER): Payer: Medicare Other | Admitting: Family Medicine

## 2021-11-24 VITALS — BP 105/66 | HR 80 | Temp 97.7°F | Ht 71.0 in | Wt 236.0 lb

## 2021-11-24 DIAGNOSIS — Q766 Other congenital malformations of ribs: Secondary | ICD-10-CM

## 2021-11-24 DIAGNOSIS — N1832 Chronic kidney disease, stage 3b: Secondary | ICD-10-CM | POA: Diagnosis not present

## 2021-11-24 DIAGNOSIS — R0781 Pleurodynia: Secondary | ICD-10-CM | POA: Diagnosis not present

## 2021-11-24 LAB — POCT URINALYSIS DIPSTICK
Bilirubin, UA: NEGATIVE
Blood, UA: NEGATIVE
Glucose, UA: POSITIVE — AB
Ketones, UA: NEGATIVE
Leukocytes, UA: NEGATIVE
Nitrite, UA: NEGATIVE
Protein, UA: NEGATIVE
Spec Grav, UA: 1.01 (ref 1.010–1.025)
Urobilinogen, UA: 0.2 E.U./dL
pH, UA: 7 (ref 5.0–8.0)

## 2021-11-24 NOTE — Progress Notes (Signed)
OFFICE VISIT ? ?11/24/2021 ? ?CC:  ?Chief Complaint  ?Patient presents with  ? Right side pain  ?  Pt also c/o back pain. Imaging completed 4/18 for DG ribs. Pt completed therapy which did not provide much relief. He would like to know his next step  ? ?Patient is a 81 y.o. male who presents for right side pain. ?I last saw him 2 wks ago for same. ?A/P as of last visit: ?"#1 acute left-sided cervical strain and right-sided lumbar strain. ?Low suspicion of vertebral, hip, or rib fracture. ?Recommended physical therapy and patient was agreeable to this plan--ordered today." ? ?INTERIM HX: ?Still having the same pain. ?Rib x-rays after last visit: no recent fracture in the ribs but it did show mild deformity in right 10th and 11th and 12th ribs that could be old healed fractures. ?This is basically the location of his pain--right posterior ribs, in line vertically with the scapula, approximate level of 10 through 12 ribs. ?He feels the pain almost exclusively when he tries to lie down or get up from lying down. ?The pain is hard for him to describe, but does says it is not sharp or radiating.  He has no rash in the area.  He has never had shingles in the area ? ?ROS as above, plus--> no fevers, no CP, no SOB, no wheezing, no cough, no dizziness, no HAs, no rashes, no melena/hematochezia.  No polyuria or polydipsia.  No myalgias or arthralgias.  No focal weakness, paresthesias, or tremors.  No acute vision or hearing abnormalities.  No dysuria or unusual/new urinary urgency or frequency.  No recent changes in lower legs. ?No n/v/d or abd pain.  No palpitations.   ? ?Past Medical History:  ?Diagnosis Date  ? AICD (automatic cardioverter/defibrillator) present 2018  ? MDT CRT-D.  Fatigue-->completely pacer dependent.  Pacer settings adjusted 12/2017 to allow more chronotrophc variance with ADL's//exertion.  ? Arthritis   ? Pt is s/p eft reverse total shoulder arthroplasty.  ? 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 Benign prostatic  hyperplasia with mixed urinary incontinence   ? Chronic combined systolic and diastolic heart failure ([place]) 05/31/2012  ? Nonischemic:  EF 40-45%, LA mod-severe dilated, AFIB.   02/2016 EF 40%, diffuse hypokinesis, grade 2 DD.  Myoc perf imaging showed EF 32% 04/2016.  Pt upgraded to CRT-D 01/04/17.  ? Chronic renal insufficiency, stage III (moderate) ([place]) 2015  ? CrCl about 60 ml/min  ? Complete heart block ([place])   ? Has dual chamber pacer.  ? COVID-19 virus infection 01/05/2021  ? paxlovid  ? Depression   ? DOE (dyspnea on exertion)   ? NYHA class II/III CHF  ? Dyspnea 2021  ? with exertion, bending over  ? Episodic low back pain 01/22/2013  ? w/intermittent radiculitis (12/2014 his neurologist referred him to pain mgmt for epidural steroid injection)  ? Erectile dysfunction 2019  ? due to zoloft--urol rx'd viagra  ? GERD (gastroesophageal reflux disease)   ? H/O tilt table evaluation 11/02/2005  ? negative  ? Helicobacter pylori gastritis 01/2016  ? History of adenomatous polyp of colon 10/12/2011  ? Dr. [name] (3 right side of colon- tubular adenomas removed)  ? History of cardiovascular stress test 05/28/2012  ? no ischemia, EF 37%, imaging results are unchanged and within normal variance  ? History of chronic prostatitis   ? History of kidney stones   ? History of vertigo   ? + Hx of posterior HA's.  Neuro (Dr.  Love) eval 2011.  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  ? Hyperlipidemia   ? Hypertension   ? Lumbar spondylosis   ? lumbosacral radiculopathy at L4 by EMG testing, right foot drop (neurologist is Dr. Linus Salmons with [facility] in [place])--neurologist referred him to neurosurgery  ? Migraine   ? "used to have them all the time; none for years" (01/04/2017)  ? 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 not entirely certain of this  ? Nephrolithiasis 07/2012  ? Left UVJ 2 mm stone with dilation of renal collecting system and  slight hydroureter on right  ? Neuropathy   ? NICM (nonischemic cardiomyopathy) ([place])   ? a. 02/2018 Cath: LM nl, LAD min irregs, LCX no, RCA 20d. [id number]. Fick CO/CI 4.4/2.0.  ? Pacemaker 02/05/2012  ? dual chamber, complete heart block, meddtronic revo, lasted checked 12/2015.  Since no CAD on cath 05/2016, cards recommends upgrade to CRT-D.  ? Permanent atrial fibrillation ([place])   ? DCCV 07/09/13-converted, lasted two days, then back into afib--needs lifetime anticoagulation (Xarelto as of 09/2014)  ? Prostate cancer screening 09/2017  ? done by urol annually (normal prostate exam documented + PSA 0.84 as of 10/01/17 urol f/u.  10/2018 urol f/u PSA 0.6, no prostate nodule.  ? Rectus diastasis   ? Right ankle sprain 08/2017  ? w/distal fibula avulsion fx noted on u/s but not plain film-(Dr. Hudnall).  ? Skin cancer of arm, left   ? "burned it off" (01/04/2017)  ? TIA (transient ischemic attack)   ? L face and L arm weakness. Peri procedural->a. 03/22/2018 following cath. CT head neg. No MRI b/c has pacer. Likely due to embolus to distal branch of RMCA  ? Type II diabetes mellitus ([place])   ? ? ?Past Surgical History:  ?Procedure Laterality Date  ? ABI's Bilateral 05/21/2018  ? normal  ? BACK SURGERY    ? BIV ICD INSERTION CRT-D N/A 01/04/2017  ? Procedure: BiV ICD ;  Surgeon: Constance Haw, MD;  Location: [place] [facility];  Service: Cardiovascular;  Laterality: N/A;  ? CARDIAC CATHETERIZATION N/A 06/14/2016  ? Minimal nonobstructive dz, EF 25-35%.  Procedure: Left Heart Cath and Coronary Angiography;  Surgeon: Peter M Martinique, MD;  Location: [place] [facility];  Service: Cardiovascular;  Laterality: N/A;  ? CARDIOVASCULAR STRESS TEST  2012  ? 2012 nuclear perfusion study: low risk scan; 04/2016 normal myocardial perfusion imaging, EF 32%.  ? CARDIOVERSION  07/09/2012  ? Procedure: CARDIOVERSION;  Surgeon: [name], MD;  Location: [place] [facility];  Service: Cardiovascular;  Laterality: N/A;  ? CATARACT EXTRACTION  W/ INTRAOCULAR LENS IMPLANT & ANTERIOR VITRECTOMY, BILATERAL Bilateral   ? CIRCUMCISION N/A 03/23/2020  ? Procedure: CIRCUMCISION ADULT;  Surgeon: Remi Haggard, MD;  [facility];  Service: Urology;  Laterality: N/A;  ? COLONOSCOPY W/ POLYPECTOMY  approx 2006; repeated 09/2011  ? Polyps on 2013 EGD as well, repeat 12/2014  ? COLONOSCOPY WITH PROPOFOL N/A 07/08/2021  ? adenoma x 1. Procedure: COLONOSCOPY WITH PROPOFOL;  Surgeon: Carol Ada, MD;  [facility];  Service: Endoscopy;  Laterality: N/A;  ? ESOPHAGOGASTRODUODENOSCOPY  10/18/2006  ? Done due to chronic GERD: Normal, bx showed no barrett's esophagus (Dr. [name])  ? FLEXOR TENDON REPAIR Left 10/02/2016  ? Procedure: LEFT RING FINGER WOUND EXPORATION AND FLEXOR TENDON REPAIR AND NERVE REPAIR;  Surgeon: Milly Jakob, MD;  Location: [place];  Service: Orthopedics;  Laterality: Left;  ?  INSERT / REPLACE / REMOVE PACEMAKER  02/05/2012  ? dual chamber, sinus node dysfunction, sinus arrest, PAF, Medtronic Revo serial#-[id number] H: last checked 05/2015  ? LUMBAR LAMINECTOMY Left 1976  ? L4-5  ? PACEMAKER REMOVAL  01/04/2017  ? PERMANENT PACEMAKER INSERTION N/A 02/05/2012  ? Procedure: PERMANENT PACEMAKER INSERTION;  Surgeon: [name], MD; Generator Medtronic [place] model [place] serial number [id number] H Laterality: N/A;  ? POLYPECTOMY  07/08/2021  ? Procedure: POLYPECTOMY;  Surgeon: Carol Ada, MD;  [facility];  Service: Endoscopy;;  ? RETINAL DETACHMENT SURGERY Left ~ 1999  ? REVERSE SHOULDER ARTHROPLASTY Left 2018  ? Left shoulder reverse [name] [place]  ? RIGHT/LEFT HEART CATH AND CORONARY ANGIOGRAPHY N/A 03/22/2018  ? EF 30-35%, no CAD.  Procedure: RIGHT/LEFT HEART CATH AND CORONARY ANGIOGRAPHY;  Surgeon: Jolaine Artist, MD;  Location: [place] [facility];  Service: Cardiovascular;  Laterality: N/A;  ? TRANSTHORACIC ECHOCARDIOGRAM  08/25/10; 05/2012; 03/23/16;12/2017  ? mild asymmetric LVH, normal systolic  function, normal diastolic fxn, mild-to-mod mitral regurg, mild aortic valve sclerosis and trace AI, mild aortic root dilatation. 2014 f/u showed EF 40-45%, mod LAE, A FIB.  02/2016 EF 40%, diffuse hypok

## 2021-11-25 ENCOUNTER — Ambulatory Visit (HOSPITAL_BASED_OUTPATIENT_CLINIC_OR_DEPARTMENT_OTHER)
Admission: RE | Admit: 2021-11-25 | Discharge: 2021-11-25 | Disposition: A | Discharge status: Home / Self Care | Payer: Medicare Other | Source: Ambulatory Visit | Attending: Family Medicine | Admitting: Family Medicine

## 2021-11-25 DIAGNOSIS — R0781 Pleurodynia: Secondary | ICD-10-CM | POA: Insufficient documentation

## 2021-11-25 DIAGNOSIS — R918 Other nonspecific abnormal finding of lung field: Secondary | ICD-10-CM | POA: Diagnosis not present

## 2021-11-25 DIAGNOSIS — Q766 Other congenital malformations of ribs: Secondary | ICD-10-CM | POA: Insufficient documentation

## 2021-11-25 DIAGNOSIS — J9811 Atelectasis: Secondary | ICD-10-CM | POA: Diagnosis not present

## 2021-11-25 DIAGNOSIS — J8410 Pulmonary fibrosis, unspecified: Secondary | ICD-10-CM | POA: Diagnosis not present

## 2021-11-28 ENCOUNTER — Telehealth: Payer: Self-pay

## 2021-11-28 DIAGNOSIS — I7121 Aneurysm of the ascending aorta, without rupture: Secondary | ICD-10-CM

## 2021-11-28 NOTE — Telephone Encounter (Signed)
Spoke with pt wife regarding labs and instructions.   

## 2021-11-28 NOTE — Telephone Encounter (Signed)
LM for pt to return call to discuss.  

## 2021-11-28 NOTE — Telephone Encounter (Signed)
Patient calling back regarding results.  Please call patient at [phone]. ?

## 2021-11-28 NOTE — Telephone Encounter (Signed)
-----   Message from Tammi Sou, MD sent at 11/28/2021  9:50 AM EDT ----- ?CT scan did not show any rib abnormality or any other finding to explain his pain. ?However, it did show an aneurism  (focal area of dilation or enlargement) of his aorta. ?This is not urgent but he needs to get established with a chest surgeon to get this evaluated and monitored with appropriate imaging over time.  Pls order referral to cardiothoracic surgery with [facility], dx ascending aortic aneurism.-thx ?

## 2021-11-30 ENCOUNTER — Other Ambulatory Visit: Payer: Self-pay

## 2021-11-30 MED ORDER — ROPINIROLE HCL 1 MG PO TABS: 1.0000 mg | ORAL_TABLET | Freq: Every day | ORAL | 3 refills | Status: DC

## 2021-12-06 ENCOUNTER — Ambulatory Visit (INDEPENDENT_AMBULATORY_CARE_PROVIDER_SITE_OTHER): Payer: Medicare Other | Admitting: Family Medicine

## 2021-12-06 ENCOUNTER — Encounter: Payer: Self-pay | Admitting: Family Medicine

## 2021-12-06 VITALS — BP 112/79 | HR 84 | Temp 97.5°F | Wt 237.0 lb

## 2021-12-06 DIAGNOSIS — E7800 Pure hypercholesterolemia, unspecified: Secondary | ICD-10-CM

## 2021-12-06 DIAGNOSIS — R0781 Pleurodynia: Secondary | ICD-10-CM | POA: Diagnosis not present

## 2021-12-06 DIAGNOSIS — N1831 Chronic kidney disease, stage 3a: Secondary | ICD-10-CM | POA: Diagnosis not present

## 2021-12-06 DIAGNOSIS — I10 Essential (primary) hypertension: Secondary | ICD-10-CM

## 2021-12-06 LAB — COMPREHENSIVE METABOLIC PANEL
ALT: 13 U/L (ref 0–53)
AST: 18 U/L (ref 0–37)
Albumin: 4.1 g/dL (ref 3.5–5.2)
Alkaline Phosphatase: 72 U/L (ref 39–117)
BUN: 20 mg/dL (ref 6–23)
CO2: 30 mEq/L (ref 19–32)
Calcium: 9.2 mg/dL (ref 8.4–10.5)
Chloride: 101 mEq/L (ref 96–112)
Creatinine, Ser: 1.58 mg/dL — ABNORMAL HIGH (ref 0.40–1.50)
GFR: 41.39 mL/min — ABNORMAL LOW (ref >60.00)
Glucose, Bld: 150 mg/dL — ABNORMAL HIGH (ref 70–99)
Potassium: 4.4 mEq/L (ref 3.5–5.1)
Sodium: 139 mEq/L (ref 135–145)
Total Bilirubin: 0.5 mg/dL (ref 0.2–1.2)
Total Protein: 6.9 g/dL (ref 6.0–8.3)

## 2021-12-06 LAB — LIPID PANEL
Cholesterol: 119 mg/dL (ref 0–200)
HDL: 36 mg/dL — ABNORMAL LOW (ref >39.00)
LDL Cholesterol: 46 mg/dL (ref 0–99)
NonHDL: 82.53
Total CHOL/HDL Ratio: 3
Triglycerides: 182 mg/dL — ABNORMAL HIGH (ref 0.0–149.0)
VLDL: 36.4 mg/dL (ref 0.0–40.0)

## 2021-12-06 MED ORDER — OMEPRAZOLE 40 MG PO CPDR: 40.0000 mg | DELAYED_RELEASE_CAPSULE | Freq: Every day | ORAL | 1 refills | Status: DC

## 2021-12-06 MED ORDER — ROSUVASTATIN CALCIUM 20 MG PO TABS: 20.0000 mg | ORAL_TABLET | Freq: Every day | ORAL | 1 refills | Status: DC

## 2021-12-06 MED ORDER — METOPROLOL SUCCINATE ER 50 MG PO TB24: ORAL_TABLET | ORAL | 1 refills | Status: DC

## 2021-12-06 NOTE — Progress Notes (Signed)
OFFICE VISIT ? ?12/06/2021 ? ?CC:  ?Chief Complaint  ?Patient presents with  ? Follow-up  ?  3 mo follow up, pt states he fell night before last and has hurt his back.   ? ? ?Patient is a 81 y.o. male who presents for 4 mo f/u HTN, HLD, CRI III. ?He has nonischemic CM (syst and diast dysfxn) and has CRT-D, also chronic a-fib and complete heart block (pacer dependent), and consistently has NYHA class II/III sx's. ?He remains on [employer] mg[employer] + CRT-D. ?His DM is managed by endocrinology--Dr. Cruzita Lederer. ? ?A/P as of last visit with me: ?"#1 hypertension.  Well-controlled. ?Cont entresto, espleronone, and toprol. ?Lytes/cr today. ?  ?#2 hypercholesterolemia.  Doing well on Crestor 20 mg a day. ?Fasting lipid panel and hepatic panel today. ? ?3.  Chronic renal insufficiency stage IIIa. ?Avoiding NSAIDs.  Trying to hydrate adequately. ?Electrolytes and creatinine today. ? ?4.  Overactive bladder.  No response to Ditropan XL 5/day.  Increase to 10 mg daily and recheck in 1 month. ?  ?#5 diabetes type 2.  Management per Dr. Cruzita Lederer.  He is doing excellent and very happy with this" ? ?INTERIM HX: ?Mason [name] is doing okay, still with the same right poster lower ribs pain. ?CT chest done after our last visit was unremarkable other than incidentally picking up an a sending aortic aneurysm, for which she is going to see CT surgery soon. ? ?No change in his chronic level of dyspnea on exertion.  No exertional chest pain or pressure. ?No cough, no wheezing. ?No known recent trauma to the chest. ? ?ROS as above, plus--> no fevers,no dizziness, no HAs, no rashes, no melena/hematochezia.  No polyuria or polydipsia.  No myalgias or arthralgias.  No focal weakness, paresthesias, or tremors.  No acute vision or hearing abnormalities.  No dysuria or unusual/new urinary urgency or frequency.  No recent changes in lower legs. ?No n/v/d or abd pain.  No palpitations.   ? ?Past Medical History:  ?Diagnosis Date  ? AICD (automatic  cardioverter/defibrillator) present 2018  ? MDT CRT-D.  Fatigue-->completely pacer dependent.  Pacer settings adjusted 12/2017 to allow more chronotrophc variance with ADL's//exertion.  ? Arthritis   ? Pt is s/p eft reverse total shoulder arthroplasty.  ? 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 Chronic combined systolic and diastolic heart failure ([place]) 05/31/2012  ? Nonischemic:  EF 40-45%, LA mod-severe dilated, AFIB.   02/2016 EF 40%, diffuse hypokinesis, grade 2 DD.  Myoc perf imaging showed EF 32% 04/2016.  Pt upgraded to CRT-D 01/04/17.  ? Chronic renal insufficiency, stage III (moderate) ([place]) 2015  ? CrCl about 60 ml/min  ? Complete heart block ([place])   ? Has dual chamber pacer.  ? COVID-19 virus infection 01/05/2021  ? paxlovid  ? Depression   ? DOE (dyspnea on exertion)   ? NYHA class II/III CHF  ? Dyspnea 2021  ? with exertion, bending over  ? Episodic low back pain 01/22/2013  ? w/intermittent radiculitis (12/2014 his neurologist referred him to pain mgmt for epidural steroid injection)  ? Erectile dysfunction 2019  ? due to zoloft--urol rx'd viagra  ? GERD (gastroesophageal reflux disease)   ? H/O tilt table evaluation 11/02/2005  ? negative  ? Helicobacter pylori gastritis 01/2016  ? History of adenomatous polyp of colon 10/12/2011  ? Dr. [name] (3 right side of colon- tubular adenomas removed)  ? History of cardiovascular stress test  05/28/2012  ? no ischemia, EF 37%, imaging results are unchanged and within normal variance  ? History of chronic prostatitis   ? History of kidney stones   ? History of vertigo   ? + Hx of posterior HA's.  Neuro (Dr. Erling Cruz) eval 2011.  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  ? Hyperlipidemia   ? Hypertension   ? Lumbar spondylosis   ? lumbosacral radiculopathy at L4 by EMG testing, right foot drop (neurologist is Dr. Linus Salmons with [facility] in [place])--neurologist referred him to neurosurgery  ? Migraine   ? "used to have them all the time; none for years" (01/04/2017)  ? Myocardial infarction [facility]) ?1970s  ? not entirely certain of this  ? Nephrolithiasis 07/2012  ? Left UVJ 2 mm stone with dilation of renal collecting system and slight hydroureter on right  ? Neuropathy   ? NICM (nonischemic cardiomyopathy) ([place])   ? a. 02/2018 Cath: LM nl, LAD min irregs, LCX no, RCA 20d. [id number]. Fick CO/CI 4.4/2.0.  ? Pacemaker 02/05/2012  ? dual chamber, complete heart block, meddtronic revo, lasted checked 12/2015.  Since no CAD on cath 05/2016, cards recommends upgrade to CRT-D.  ? Permanent atrial fibrillation ([place])   ? DCCV 07/09/13-converted, lasted two days, then back into afib--needs lifetime anticoagulation (Xarelto as of 09/2014)  ? Prostate cancer screening 09/2017  ? done by urol annually (normal prostate exam documented + PSA 0.84 as of 10/01/17 urol f/u.  10/2018 urol f/u PSA 0.6, no prostate nodule.  ? Rectus diastasis   ? Right ankle sprain 08/2017  ? w/distal fibula avulsion fx noted on u/s but not plain film-(Dr. Hudnall).  ? Skin cancer of arm, left   ? "burned it off" (01/04/2017)  ? TIA (transient ischemic attack)   ? L face and L arm weakness. Peri procedural->a. 03/22/2018 following cath. CT head neg. No MRI b/c has pacer. Likely due to embolus to distal branch of RMCA  ? Type II diabetes mellitus ([place])   ? ? ?Past Surgical History:  ?Procedure Laterality Date  ? ABI's Bilateral 05/21/2018  ? normal  ? BACK SURGERY    ? BIV ICD INSERTION CRT-D N/A 01/04/2017  ? Procedure: BiV ICD ;  Surgeon: Constance Haw, MD;  Location: [place] [facility];  Service: Cardiovascular;  Laterality: N/A;  ? CARDIAC CATHETERIZATION N/A 06/14/2016  ? Minimal nonobstructive dz, EF 25-35%.  Procedure: Left Heart Cath and Coronary Angiography;  Surgeon: Peter M Martinique, MD;  Location: [place] [facility];  Service: Cardiovascular;  Laterality: N/A;  ?  CARDIOVASCULAR STRESS TEST  2012  ? 2012 nuclear perfusion study: low risk scan; 04/2016 normal myocardial perfusion imaging, EF 32%.  ? CARDIOVERSION  07/09/2012  ? Procedure: CARDIOVERSION;  Surgeon: [name], MD;  Location: [place] [facility];  Service: Cardiovascular;  Laterality: N/A;  ? CATARACT EXTRACTION W/ INTRAOCULAR LENS IMPLANT & ANTERIOR VITRECTOMY, BILATERAL Bilateral   ? CIRCUMCISION N/A 03/23/2020  ? Procedure: CIRCUMCISION ADULT;  Surgeon: Remi Haggard, MD;  [facility];  Service: Urology;  Laterality: N/A;  ? COLONOSCOPY W/ POLYPECTOMY  approx 2006; repeated 09/2011  ? Polyps on 2013 EGD as well, repeat 12/2014  ? COLONOSCOPY WITH PROPOFOL N/A 07/08/2021  ? adenoma x 1. Procedure: COLONOSCOPY WITH PROPOFOL;  Surgeon: Carol Ada, MD;  [facility];  Service: Endoscopy;  Laterality: N/A;  ? ESOPHAGOGASTRODUODENOSCOPY  10/18/2006  ? Done due to chronic GERD: Normal,  bx showed no barrett's esophagus (Dr. [name])  ? FLEXOR TENDON REPAIR Left 10/02/2016  ? Procedure: LEFT RING FINGER WOUND EXPORATION AND FLEXOR TENDON REPAIR AND NERVE REPAIR;  Surgeon: Milly Jakob, MD;  Location: [place];  Service: Orthopedics;  Laterality: Left;  ? INSERT / REPLACE / REMOVE PACEMAKER  02/05/2012  ? dual chamber, sinus node dysfunction, sinus arrest, PAF, Medtronic Revo serial#-[id number] H: last checked 05/2015  ? LUMBAR LAMINECTOMY Left 1976  ? L4-5  ? PACEMAKER REMOVAL  01/04/2017  ? PERMANENT PACEMAKER INSERTION N/A 02/05/2012  ? Procedure: PERMANENT PACEMAKER INSERTION;  Surgeon: [name], MD; Generator Medtronic [place] model [place] serial number [id number] H Laterality: N/A;  ? POLYPECTOMY  07/08/2021  ? Procedure: POLYPECTOMY;  Surgeon: Carol Ada, MD;  [facility];  Service: Endoscopy;;  ? RETINAL DETACHMENT SURGERY Left ~ 1999  ? REVERSE SHOULDER ARTHROPLASTY Left 2018  ? Left shoulder reverse [name] [place]  ? RIGHT/LEFT HEART CATH AND CORONARY ANGIOGRAPHY  N/A 03/22/2018  ? EF 30-35%, no CAD.  Procedure: RIGHT/LEFT HEART CATH AND CORONARY ANGIOGRAPHY;  Surgeon: Jolaine Artist, MD;  Location: [place] [facility];  Service: Cardiovascular;  Laterality: N/A;  ? TRANSTHORACIC ECHO

## 2021-12-08 ENCOUNTER — Institutional Professional Consult (permissible substitution) (INDEPENDENT_AMBULATORY_CARE_PROVIDER_SITE_OTHER): Payer: Medicare Other | Admitting: Surgical

## 2021-12-08 VITALS — BP 74/52 | HR 73 | Resp 20 | Ht 71.0 in | Wt 237.0 lb

## 2021-12-08 DIAGNOSIS — I7121 Aneurysm of the ascending aorta, without rupture: Secondary | ICD-10-CM | POA: Diagnosis not present

## 2021-12-08 NOTE — Progress Notes (Signed)
Subjective:     Patient ID: Mason [name], male    DOB: 12-13-1940, 81 y.o.   MRN: [id number]  Chief Complaint  Patient presents with   Thoracic Aortic Aneurysm    Surgical consult Chest CT 11/25/21   Patient Active Problem List   Diagnosis Date Noted   Posterior vitreous detachment of left eye 09/12/2021   Glaucoma 04/19/2021   Chronic iritis, left eye 02/14/2021   Left epiretinal membrane 02/14/2021   Secondary iritis of left eye 02/14/2021   Primary open angle glaucoma of both eyes, mild stage 02/14/2021   History of retinal detachment 09/23/2020   Phacodonesis 09/23/2020   Posterior vitreous detachment of right eye 09/23/2020   Pseudophakodonesis 09/23/2020   Intermediate stage nonexudative age-related macular degeneration of both eyes 09/23/2020   Gastroesophageal reflux disease 09/14/2020   Long term (current) use of anticoagulants 09/03/2018   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 2018 07/24/2018   NICM (nonischemic cardiomyopathy) ([place]) 03/23/2018   CKD (chronic kidney disease), stage III ([place]) 03/23/2018   TIA (transient ischemic attack) 03/22/2018   TIA resulting from procedure 03/22/2018   Essential hypertension 01/18/2018   CHF (congestive heart failure) ([place]) 19/37/9024   Chronic systolic heart failure ([place]) 08/04/2016   Chronic renal insufficiency, stage II (mild) 12/09/2015   Permanent atrial fibrillation ([place]) 06/22/2015   Poorly controlled type 2 diabetes mellitus with circulatory disorder ([place]) 03/25/2015   Leg fatigue 02/15/2014   Lumbar back pain with radiculopathy affecting left lower extremity 02/15/2014   Other malaise and fatigue 02/05/2014   Osteoarthritis of right knee 10/14/2013   HTN (hypertension), benign 05/26/2013   Hyperlipidemia 04/21/2013   Atrial fibrillation, permanent 03/08/2013   Left ventricular dysfunction 05/31/2012   Complete heart block ([place]) 02/06/2012   Dyslipidemia (high LDL; low HDL)  02/06/2012     HPI Patient is in today for assistance with management of a ascending aortic aneurysm found on CT scan measuring 4.3 cm.  This was an incidental finding.  He does have a significant cardiac history and in terms of symptoms he gets some shortness of breath with exertion as well as occasional dizzy spells.  He has chronic atrial fibrillation as well as an AICD.  He has known history of diabetes with significant neuropathy.  He denies chest pain or tightness with either exertion or at rest.  Review of Systems  Constitutional:  Positive for malaise/fatigue.  HENT:  Positive for hearing loss.   Eyes:  Positive for blurred vision.  Respiratory:  Positive for cough and shortness of breath.   Gastrointestinal:  Positive for heartburn.  Genitourinary:  Positive for frequency.  Musculoskeletal:  Positive for joint pain and myalgias.  Neurological:  Positive for sensory change.       Previous CVA  Endo/Heme/Allergies:  Bruises/bleeds easily.  Psychiatric/Behavioral:  Positive for depression.        Objective:    There were no vitals taken for this visit. BP Readings from Last 3 Encounters:  12/08/21 (!) 74/52  12/06/21 112/79  11/24/21 105/66     Current Outpatient Medications  Medication Instructions   acetaminophen (TYLENOL) 650 mg, Oral, Every 6 hours PRN   BD INSULIN SYRINGE U/F 31G X 5/16" 1 ML MISC USE DAILY AS DIRECTED   COMBIGAN 0.2-0.5 % ophthalmic solution 1 drop, Both Eyes, Daily at bedtime   Continuous Blood Gluc Sensor (FREESTYLE LIBRE SENSOR SYSTEM) MISC USE TO CHECK BLOOD SUGARS 4 TIMES DAILY. Before breakfast, before lunch, before supper, and at  bedtime   diclofenac Sodium (VOLTAREN) 4 g, Topical, 4 times daily   empagliflozin (JARDIANCE) 10 mg, Oral, Daily, **Need appointment before anymore refills given**   ENTRESTO 97-103 MG TAKE 1 TABLET TWO TIMES A DAY   eplerenone (INSPRA) 25 mg, Oral, Daily   furosemide (LASIX) 20 mg, Oral, Daily PRN   gabapentin  (NEURONTIN) 300 MG capsule TAKE 2 CAPSULES TWICE A DAY   insulin aspart (NOVOLOG FLEXPEN) 100 UNIT/ML FlexPen INJECT 6-8 UNITS UNDER THE SKIN AT THE TIME OF EACH MEAL   insulin glargine (LANTUS) 36-40 Units, Subcutaneous, Daily, INJECT 54 UNITS UNDER THE SKIN DAILY, WILL BE TITRATING DOSE PERIODICALLY   Insulin Pen Needle (SURE COMFORT PEN NEEDLES) 31G X 5 MM MISC USE TO INJECT INSULIN UNDER THE SKIN   ketorolac (ACULAR) 0.5 % ophthalmic solution 1 drop, Left Eye, 2 times daily   latanoprost (XALATAN) 0.005 % ophthalmic solution 1 drop, Both Eyes, Daily at bedtime   lidocaine (LIDODERM) 5 % 1 patch, Transdermal, Every 24 hours, Remove & Discard patch within 12 hours or as directed by MD   meclizine (ANTIVERT) 25 MG tablet TAKE 1 TABLET THREE TIMES A DAY AS NEEDED FOR DIZZINESS   metoprolol succinate (TOPROL-XL) 50 MG 24 hr tablet TAKE 1 TABLET DAILY. TAKE WITH OR IMMEDIATELY FOLLOWING A MEAL Strength: 50 mg   omeprazole (PRILOSEC) 40 mg, Oral, Daily   oxybutynin (DITROPAN XL) 10 mg, Oral, Daily at bedtime   rOPINIRole (REQUIP) 1 mg, Oral, Daily   rosuvastatin (CRESTOR) 20 mg, Oral, Daily   sertraline (ZOLOFT) 100 mg, Oral, Daily   Trulicity 3 mg, Subcutaneous, Weekly   XARELTO 20 MG TABS tablet TAKE 1 TABLET DAILY WITH SUPPER     Physical Exam Constitutional:      General: He is not in acute distress.    Appearance: Normal appearance.  HENT:     Head: Normocephalic and atraumatic.     Mouth/Throat:     Mouth: Mucous membranes are moist.  Cardiovascular:     Rate and Rhythm: Normal rate. Rhythm irregular.     Comments: Decreased pulses Pulmonary:     Effort: Pulmonary effort is normal.     Breath sounds: Normal breath sounds.  Abdominal:     General: Abdomen is flat.     Palpations: Abdomen is soft.  Skin:    General: Skin is warm and dry.     Capillary Refill: Capillary refill takes less than 2 seconds.     Coloration: Skin is not jaundiced or pale.  Neurological:     Mental  Status: He is alert and oriented to person, place, and time.     Comments: Minor mouth droop, left side  Psychiatric:        Mood and Affect: Mood normal.        Behavior: Behavior normal.    CT Chest Wo Contrast  Result Date: 11/25/2021 CLINICAL DATA:  Evaluate ribs, right rib pain. EXAM: CT CHEST WITHOUT CONTRAST TECHNIQUE: Multidetector CT imaging of the chest was performed following the standard protocol without IV contrast. RADIATION DOSE REDUCTION: This exam was performed according to the departmental dose-optimization program which includes automated exposure control, adjustment of the mA and/or kV according to patient size and/or use of iterative reconstruction technique. COMPARISON:  Right rib x-ray 11/08/2021. FINDINGS: Cardiovascular: There is mild aneurysmal dilatation of the ascending aorta measuring 4.3 x 4.2 cm. Heart is mildly enlarged. There is no pericardial effusion. Pacemaker is present. There are atherosclerotic calcifications of the aorta  and coronary arteries. Mediastinum/Nodes: There is a mildly enlarged precarinal lymph node measuring 11 mm short axis. There are calcified subcarinal and left hilar lymph nodes. There are nonenlarged paratracheal lymph nodes. Esophagus and visualized thyroid gland are within normal limits. Lungs/Pleura: There are calcified granulomas bilaterally. There also a few scattered micro nodules measuring 2 mm or less in the right lung. There is minimal scarring in the right lung apex. There is some atelectatic change in the right lung base. There is some minimal peripheral reticular and ground-glass opacities in the right lung base as well. No pleural effusion or pneumothorax identified. Upper Abdomen: No acute abnormality. Musculoskeletal: No evidence for acute or healed rib fracture. No focal osseous lesion identified. Left shoulder arthroplasty is present. Degenerative changes affect the thoracic spine. IMPRESSION: 1. No evidence for fracture or focal bone  lesion. 2. 4.3 cm ascending aortic aneurysm. Recommend semi-annual imaging followup by CTA or MRA and referral to cardiothoracic surgery if not already obtained. This recommendation follows 2010 ACCF/AHA/AATS/ACR/ASA/SCA/SCAI/SIR/STS/SVM Guidelines for the Diagnosis and Management of Patients With Thoracic Aortic Disease. Circulation. 2010; 121: T517-O16. Aortic aneurysm NOS (ICD10-I71.9) 3. Multiple pulmonary nodules. Most severe: 2 mm right solid pulmonary nodule. No routine follow-up imaging is recommended per Fleischner Society Guidelines. These guidelines do not apply to immunocompromised patients and patients with cancer. Follow up in patients with significant comorbidities as clinically warranted. For lung cancer screening, adhere to Lung-RADS guidelines. Reference: Radiology. 2017; 284(1):228-43. 4. Cardiomegaly. 5. Minimal ground-glass and peripheral reticular opacities in the right lung base, nonspecific. Findings may be infectious/inflammatory. Electronically Signed   By: Ronney Asters M.D.   On: 11/25/2021 19:17        Assessment & Plan:   Problem List Items Addressed This Visit   None Visit Diagnoses     Aneurysm of ascending aorta without rupture ([place])    -  Primary     Ascending thoracic aortic aneurysm that will require ongoing surveillance.  We will obtain a CTA of the chest in 6 months.  We discussed health and lifestyle modifications to assist with overall management of this condition including good control of blood pressure.  We also discussed some lifestyle modifications in terms of stress and sleep as well as diet/nutrition/exercise    No orders of the defined types were placed in this encounter.   No follow-ups on file.  John Giovanni, PA-C

## 2021-12-08 NOTE — Patient Instructions (Signed)
Activity and lifestyle modification as discussed

## 2021-12-09 ENCOUNTER — Ambulatory Visit (INDEPENDENT_AMBULATORY_CARE_PROVIDER_SITE_OTHER): Payer: Medicare Other | Admitting: Family Medicine

## 2021-12-09 ENCOUNTER — Encounter: Payer: Self-pay | Admitting: Family Medicine

## 2021-12-09 VITALS — BP 100/64 | Ht 71.0 in | Wt 237.0 lb

## 2021-12-09 DIAGNOSIS — M5414 Radiculopathy, thoracic region: Secondary | ICD-10-CM | POA: Diagnosis not present

## 2021-12-09 MED ORDER — PREDNISONE 5 MG PO TABS: ORAL_TABLET | ORAL | 0 refills | Status: DC

## 2021-12-09 NOTE — Patient Instructions (Signed)
Nice to meet you Please try heat  Please try lidocaine on the area   Please send me a message in [place] with any questions or updates.  Please see me back in 2-3 weeks.   --Dr. Raeford Razor

## 2021-12-09 NOTE — Assessment & Plan Note (Signed)
Acutely occurring.  Symptoms seem more consistent with a radicular type pain from the thoracic region.  Less likely for a shingles origin. -Counseled on home exercise therapy and supportive care. -Prednisone. -Counseled on increasing gabapentin. -Could consider further imaging or consider nerve block.

## 2021-12-09 NOTE — Progress Notes (Signed)
LOGON UTTECH - 81 y.o. male MRN [id number]  Date of birth: 1940/11/08  SUBJECTIVE:  Including CC & ROS.  No chief complaint on file.   Mason [name] is a 81 y.o. male that is presenting with a 14-monthhistory of this right-sided radicular type pain from the thoracic back to the anterior portion of the abdomen.  Denies any rash.  Does have pain with transitioning to lying down or lying flat.  Does have a history of back surgery.  No recent illnesses.  No improvement with physical therapy.  He is currently taking gabapentin.  Independent review of the thoracic spine x-ray from 2022 shows a thoracic spondylosis. Independent review of the lumbar spine x-ray from 2018 shows mild retrolisthesis at L1-2 L2-3. Independent review of the right rib x-ray from 4/18 shows a mild deformity in the 10th 11th and 12th ribs on the right side without break. Independent review of the CT chest from 5/5 shows no rib deformity.  Review of Systems See HPI   HISTORY: Past Medical, Surgical, Social, and Family History Reviewed & Updated per EMR.   Pertinent Historical Findings include:  Past Medical History:  Diagnosis Date   AICD (automatic cardioverter/defibrillator) present 2018   MDT CRT-D.  Fatigue-->completely pacer dependent.  Pacer settings adjusted 12/2017 to allow more chronotrophc variance with ADL's//exertion.   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   02/2016 EF 40%, diffuse hypokinesis, grade 2 DD.  Myoc perf imaging showed EF 32% 04/2016.  Pt upgraded to CRT-D 01/04/17.   Chronic renal insufficiency, stage III (moderate) (HCC) 2015   CrCl about 60 ml/min   Complete heart block (H[facility])    Has dual chamber pacer.   COVID-19 virus infection  01/05/2021   paxlovid   Depression    DOE (dyspnea on exertion)    NYHA class II/III CHF   Dyspnea 2021   with exertion, bending over   Episodic low back pain 01/22/2013   w/intermittent radiculitis (12/2014 his neurologist referred him to pain mgmt for epidural steroid injection)   Erectile dysfunction 2019   due to zoloft--urol rx'd viagra   GERD (gastroesophageal reflux disease)    H/O tilt table evaluation 041/74/0814  negative   Helicobacter pylori gastritis 01/2016   History of adenomatous polyp of colon 10/12/2011   Dr. HBenson Norway(3 right side of colon- tubular adenomas removed)   History of cardiovascular stress test 05/28/2012   no ischemia, EF 37%, imaging results are unchanged and within normal variance   History of chronic prostatitis    History of kidney stones    History of vertigo    + Hx of posterior HA's.  Neuro (Dr. LErling Cruz eval 2011.  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   Hyperlipidemia    Hypertension    Lumbar spondylosis    lumbosacral radiculopathy at L4 by EMG testing, right foot drop (neurologist is Dr. CLinus Salmonswith [facility] in [place])--neurologist referred him to neurosurgery   Migraine    "used to have them all the time; none for years" (01/04/2017)   Myocardial infarction ([facility] ?1970s   not entirely certain of this   Nephrolithiasis 07/2012   Left UVJ  2 mm stone with dilation of renal collecting system and slight hydroureter on right   Neuropathy    NICM (nonischemic cardiomyopathy) ([place])    a. 02/2018 Cath: LM nl, LAD min irregs, LCX no, RCA 20d. NTIR44. Fick CO/CI 4.4/2.0.   Osteoarthritis, multiple sites    Shoulders, back, knees   Pacemaker 02/05/2012   dual chamber, complete heart block, meddtronic revo, lasted checked 12/2015.  Since no CAD on cath 05/2016, cards recommends upgrade to CRT-D.   Permanent atrial fibrillation ([place])    DCCV 07/09/13-converted,  lasted two days, then back into afib--needs lifetime anticoagulation (Xarelto as of 09/2014)   Prostate cancer screening 09/2017   done by urol annually (normal prostate exam documented + PSA 0.84 as of 10/01/17 urol f/u.  10/2018 urol f/u PSA 0.6, no prostate nodule.   Rectus diastasis    Right ankle sprain 08/2017   w/distal fibula avulsion fx noted on u/s but not plain film-(Dr. Hudnall).   Skin cancer of arm, left    "burned it off" (01/04/2017)   TIA (transient ischemic attack)    L face and L arm weakness. Peri procedural->a. 03/22/2018 following cath. CT head neg. No MRI b/c has pacer. Likely due to embolus to distal branch of RMCA   Type II diabetes mellitus ([place])     Past Surgical History:  Procedure Laterality Date   ABI's Bilateral 05/21/2018   normal   BACK SURGERY     BIV ICD INSERTION CRT-D N/A 01/04/2017   Procedure: BiV ICD ;  Surgeon: Constance Haw, MD;  Location: [place];  Service: Cardiovascular;  Laterality: N/A;   CARDIAC CATHETERIZATION N/A 06/14/2016   Minimal nonobstructive dz, EF 25-35%.  Procedure: Left Heart Cath and Coronary Angiography;  Surgeon: Peter M Martinique, MD;  Location: [place] [facility];  Service: Cardiovascular;  Laterality: N/A;   CARDIOVASCULAR STRESS TEST  2012   2012 nuclear perfusion study: low risk scan; 04/2016 normal myocardial perfusion imaging, EF 32%.   CARDIOVERSION  07/09/2012   Procedure: CARDIOVERSION;  Surgeon: [name], MD;  Location: [facility];  Service: Cardiovascular;  Laterality: N/A;   CATARACT EXTRACTION W/ INTRAOCULAR LENS IMPLANT & ANTERIOR VITRECTOMY, BILATERAL Bilateral    CIRCUMCISION N/A 03/23/2020   Procedure: CIRCUMCISION ADULT;  Surgeon: Remi Haggard, MD;  Location: [facility];  Service: Urology;  Laterality: N/A;   COLONOSCOPY W/ POLYPECTOMY  approx 2006; repeated 09/2011   Polyps on 2013 EGD as well, repeat 12/2014   COLONOSCOPY WITH PROPOFOL N/A 07/08/2021   adenoma x 1. Procedure:  COLONOSCOPY WITH PROPOFOL;  Surgeon: Carol Ada, MD;  Location: [facility];  Service: Endoscopy;  Laterality: N/A;   ESOPHAGOGASTRODUODENOSCOPY  10/18/2006   Done due to chronic GERD: Normal, bx showed no barrett's esophagus (Dr. [name])   [place] Left 10/02/2016   Procedure: LEFT RING FINGER WOUND EXPORATION AND FLEXOR TENDON REPAIR AND NERVE REPAIR;  Surgeon: Milly Jakob, MD;  Location: [facility];  Service: Orthopedics;  Laterality: Left;   INSERT / REPLACE / REMOVE PACEMAKER  02/05/2012   dual chamber, sinus node dysfunction, sinus arrest, PAF, Medtronic Revo serial#-[id number] H: last checked 05/2015   LUMBAR LAMINECTOMY Left 1976   L4-5   PACEMAKER REMOVAL  01/04/2017   PERMANENT PACEMAKER INSERTION N/A 02/05/2012   Procedure: PERMANENT PACEMAKER INSERTION;  Surgeon: [name], MD; Generator Medtronic [place] model [place] serial number [id number] H Laterality: N/A;   POLYPECTOMY  07/08/2021   Procedure: POLYPECTOMY;  Surgeon: Carol Ada, MD;  Location:  [facility];  Service: Endoscopy;;   RETINAL DETACHMENT SURGERY Left ~ [place] Left 2018   Left shoulder reverse TSA ([facility] in [place]).   RIGHT/LEFT HEART CATH AND CORONARY ANGIOGRAPHY N/A 03/22/2018   EF 30-35%, no CAD.  Procedure: RIGHT/LEFT HEART CATH AND CORONARY ANGIOGRAPHY;  Surgeon: Jolaine Artist, MD;  Location: [place] [facility];  Service: Cardiovascular;  Laterality: N/A;   TRANSTHORACIC ECHOCARDIOGRAM  08/25/10; 05/2012; 03/23/16;12/2017   mild asymmetric LVH, normal systolic function, normal diastolic fxn, mild-to-mod mitral regurg, mild aortic valve sclerosis and trace AI, mild aortic root dilatation. 2014 f/u showed EF 40-45%, mod LAE, A FIB.  02/2016 EF 40%, diffuse hypokinesis, grade 2 DD. 12/2017 EF 35-40%,diffuse hypokin,grd III DD, mild MR   WISDOM TOOTH EXTRACTION  06/27/2021   EF 50-55%, mild aortic root/ascending aorta dilatation (41-42 mm).     PHYSICAL  EXAM:  VS: BP 100/64 (BP Location: Left Arm, Patient Position: Sitting)   Ht '5\' 11"'$  (1.803 m)   Wt 237 lb (107.5 kg)   BMI 33.05 kg/m  Physical Exam Gen: NAD, alert, cooperative with exam, well-appearing MSK:  Neurovascularly intact       ASSESSMENT & PLAN:   Thoracic radiculopathy Acutely occurring.  Symptoms seem more consistent with a radicular type pain from the thoracic region.  Less likely for a shingles origin. -Counseled on home exercise therapy and supportive care. -Prednisone. -Counseled on increasing gabapentin. -Could consider further imaging or consider nerve block.

## 2021-12-21 ENCOUNTER — Other Ambulatory Visit: Payer: Self-pay | Admitting: Family Medicine

## 2021-12-22 ENCOUNTER — Telehealth: Payer: Self-pay

## 2021-12-22 ENCOUNTER — Other Ambulatory Visit: Payer: Self-pay

## 2021-12-22 DIAGNOSIS — E1165 Type 2 diabetes mellitus with hyperglycemia: Secondary | ICD-10-CM

## 2021-12-22 MED ORDER — INSULIN GLARGINE 100 UNIT/ML ~~LOC~~ SOLN: 40.0000 [IU] | Freq: Every day | SUBCUTANEOUS | 1 refills | Status: DC

## 2021-12-22 MED ORDER — MECLIZINE HCL 25 MG PO TABS: ORAL_TABLET | ORAL | 3 refills | Status: DC

## 2021-12-22 MED ORDER — INSULIN GLARGINE 100 UNIT/ML ~~LOC~~ SOLN: 40.0000 [IU] | Freq: Every day | SUBCUTANEOUS | 0 refills | Status: DC

## 2021-12-22 NOTE — Telephone Encounter (Signed)
Pt advised refill sent. °

## 2021-12-22 NOTE — Telephone Encounter (Signed)
Patient refill request.  [facility].  Patient states that mail order pharmacy has sent several faxes to our office with no response.  Verifed   meclizine (ANTIVERT) 25 MG tablet [[id number]]

## 2021-12-22 NOTE — Telephone Encounter (Signed)
Pt requested a rx refill for 90 days to mail order pharmacy and temp rx sent to [facility].

## 2021-12-23 MED ORDER — INSULIN GLARGINE 100 UNIT/ML ~~LOC~~ SOLN: 40.0000 [IU] | Freq: Every day | SUBCUTANEOUS | 0 refills | Status: DC

## 2021-12-23 NOTE — Addendum Note (Signed)
Addended by: Lauralyn Primes on: 12/23/2021 03:29 PM   Modules accepted: Orders

## 2021-12-27 ENCOUNTER — Ambulatory Visit (INDEPENDENT_AMBULATORY_CARE_PROVIDER_SITE_OTHER): Payer: Medicare Other | Admitting: Family Medicine

## 2021-12-27 ENCOUNTER — Encounter: Payer: Self-pay | Admitting: Family Medicine

## 2021-12-27 VITALS — BP 110/62 | Ht 71.0 in | Wt 237.0 lb

## 2021-12-27 DIAGNOSIS — M5414 Radiculopathy, thoracic region: Secondary | ICD-10-CM | POA: Diagnosis not present

## 2021-12-27 NOTE — Progress Notes (Signed)
Mason [name] - 81 y.o. male MRN [id number]  Date of birth: 1941/04/28  SUBJECTIVE:  Including CC & ROS.  No chief complaint on file.   Mason [name] is a 81 y.o. male that is presenting with ongoing radicular type pain in the thoracic region.  He has tried medications as well as physical therapy.  Pain is still ongoing despite conservative measures.   Review of Systems See HPI   HISTORY: Past Medical, Surgical, Social, and Family History Reviewed & Updated per EMR.   Pertinent Historical Findings include:  Past Medical History:  Diagnosis Date   AICD (automatic cardioverter/defibrillator) present 2018   MDT CRT-D.  Fatigue-->completely pacer dependent.  Pacer settings adjusted 12/2017 to allow more chronotrophc variance with ADL's//exertion.   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   02/2016 EF 40%, diffuse hypokinesis, grade 2 DD.  Myoc perf imaging showed EF 32% 04/2016.  Pt upgraded to CRT-D 01/04/17.   Chronic renal insufficiency, stage III (moderate) (HCC) 2015   CrCl about 60 ml/min   Complete heart block (HCC)    Has dual chamber pacer.   COVID-19 virus infection 01/05/2021   paxlovid   Depression    DOE (dyspnea on exertion)    NYHA class II/III CHF   Dyspnea 2021   with exertion, bending over   Episodic low back pain 01/22/2013   w/intermittent radiculitis (12/2014 his neurologist referred him to pain mgmt for epidural steroid injection)   Erectile dysfunction 2019   due to zoloft--urol rx'd viagra   GERD (gastroesophageal reflux disease)    H/O tilt table evaluation 89/21/1941   negative   Helicobacter pylori gastritis 01/2016   History of adenomatous polyp of colon 10/12/2011   Dr. [name] (3 right side of  colon- tubular adenomas removed)   History of cardiovascular stress test 05/28/2012   no ischemia, EF 37%, imaging results are unchanged and within normal variance   History of chronic prostatitis    History of kidney stones    History of vertigo    + Hx of posterior HA's.  Neuro (Dr. Erling Cruz) eval 2011.  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   Hyperlipidemia    Hypertension    Lumbar spondylosis    lumbosacral radiculopathy at L4 by EMG testing, right foot drop (neurologist is Dr. Linus Salmons with [facility] in [place])--neurologist referred him to neurosurgery   Migraine    "used to have them all the time; none for years" (01/04/2017)   Myocardial infarction [facility]) ?1970s   not entirely certain of this   Nephrolithiasis 07/2012   Left UVJ 2 mm stone with dilation of renal collecting system and slight hydroureter on right   Neuropathy    NICM (nonischemic cardiomyopathy) ([place])    a. 02/2018 Cath: LM nl, LAD min irregs, LCX no, RCA 20d. DEYC14. Fick CO/CI 4.4/2.0.   Osteoarthritis, multiple sites    Shoulders, back, knees   Pacemaker 02/05/2012   dual chamber, complete heart block, meddtronic revo, lasted checked 12/2015.  Since no CAD on cath 05/2016, cards recommends upgrade to CRT-D.   Permanent atrial fibrillation ([place])    DCCV 07/09/13-converted, lasted two days, then back into afib--needs  lifetime anticoagulation (Xarelto as of 09/2014)   Prostate cancer screening 09/2017   done by urol annually (normal prostate exam documented + PSA 0.84 as of 10/01/17 urol f/u.  10/2018 urol f/u PSA 0.6, no prostate nodule.   Rectus diastasis    Right ankle sprain 08/2017   w/distal fibula avulsion fx noted on u/s but not plain film-(Dr. Hudnall).   Skin cancer of arm, left    "burned it off" (01/04/2017)   TIA (transient ischemic attack)    L face and L arm weakness. Peri procedural->a. 03/22/2018 following cath. CT  head neg. No MRI b/c has pacer. Likely due to embolus to distal branch of RMCA   Type II diabetes mellitus ([place])     Past Surgical History:  Procedure Laterality Date   ABI's Bilateral 05/21/2018   normal   BACK SURGERY     BIV ICD INSERTION CRT-D N/A 01/04/2017   Procedure: BiV ICD ;  Surgeon: Constance Haw, MD;  Location: [facility];  Service: Cardiovascular;  Laterality: N/A;   CARDIAC CATHETERIZATION N/A 06/14/2016   Minimal nonobstructive dz, EF 25-35%.  Procedure: Left Heart Cath and Coronary Angiography;  Surgeon: Peter M Martinique, MD;  Location: [facility];  Service: Cardiovascular;  Laterality: N/A;   CARDIOVASCULAR STRESS TEST  2012   2012 nuclear perfusion study: low risk scan; 04/2016 normal myocardial perfusion imaging, EF 32%.   CARDIOVERSION  07/09/2012   Procedure: CARDIOVERSION;  Surgeon: [name], MD;  Location: [facility];  Service: Cardiovascular;  Laterality: N/A;   CATARACT EXTRACTION W/ INTRAOCULAR LENS IMPLANT & ANTERIOR VITRECTOMY, BILATERAL Bilateral    CIRCUMCISION N/A 03/23/2020   Procedure: CIRCUMCISION ADULT;  Surgeon: Remi Haggard, MD;  Location: [facility];  Service: Urology;  Laterality: N/A;   COLONOSCOPY W/ POLYPECTOMY  approx 2006; repeated 09/2011   Polyps on 2013 EGD as well, repeat 12/2014   COLONOSCOPY WITH PROPOFOL N/A 07/08/2021   adenoma x 1. Procedure: COLONOSCOPY WITH PROPOFOL;  Surgeon: Carol Ada, MD;  Location: [facility];  Service: Endoscopy;  Laterality: N/A;   ESOPHAGOGASTRODUODENOSCOPY  10/18/2006   Done due to chronic GERD: Normal, bx showed no barrett's esophagus (Dr. [name])   [place] Left 10/02/2016   Procedure: LEFT RING FINGER WOUND EXPORATION AND FLEXOR TENDON REPAIR AND NERVE REPAIR;  Surgeon: Milly Jakob, MD;  Location: [facility];  Service: Orthopedics;  Laterality: Left;   INSERT / REPLACE / REMOVE PACEMAKER  02/05/2012   dual chamber, sinus node dysfunction, sinus arrest, PAF,  Medtronic Revo serial#-[id number] H: last checked 05/2015   LUMBAR LAMINECTOMY Left 1976   L4-5   PACEMAKER REMOVAL  01/04/2017   PERMANENT PACEMAKER INSERTION N/A 02/05/2012   Procedure: PERMANENT PACEMAKER INSERTION;  Surgeon: [name], MD; Generator Medtronic [place] model [place] serial number [id number] H Laterality: N/A;   POLYPECTOMY  07/08/2021   Procedure: POLYPECTOMY;  Surgeon: Carol Ada, MD;  Location: [facility];  Service: Endoscopy;;   RETINAL DETACHMENT SURGERY Left ~ [place] Left 2018   Left shoulder reverse [name] in [place]).   RIGHT/LEFT HEART CATH AND CORONARY ANGIOGRAPHY N/A 03/22/2018   EF 30-35%, no CAD.  Procedure: RIGHT/LEFT HEART CATH AND CORONARY ANGIOGRAPHY;  Surgeon: Jolaine Artist, MD;  Location: [facility];  Service: Cardiovascular;  Laterality: N/A;   TRANSTHORACIC ECHOCARDIOGRAM  08/25/10; 05/2012; 03/23/16;12/2017   mild asymmetric LVH, normal systolic function, normal diastolic fxn, mild-to-mod mitral regurg, mild aortic valve sclerosis and  trace AI, mild aortic root dilatation. 2014 f/u showed EF 40-45%, mod LAE, A FIB.  02/2016 EF 40%, diffuse hypokinesis, grade 2 DD. 12/2017 EF 35-40%,diffuse hypokin,grd III DD, mild MR   WISDOM TOOTH EXTRACTION  06/27/2021   EF 50-55%, mild aortic root/ascending aorta dilatation (41-42 mm).     PHYSICAL EXAM:  VS: BP 110/62 (BP Location: Left Arm, Patient Position: Sitting)   Ht '5\' 11"'$  (1.803 m)   Wt 237 lb (107.5 kg)   BMI 33.05 kg/m  Physical Exam Gen: NAD, alert, cooperative with exam, well-appearing MSK:  Back: Limited flexion. Pain with extension. Neurovascularly intact       ASSESSMENT & PLAN:   Thoracic radiculopathy Pain is still ongoing.  Originally starting in early April.  He has tried physical therapy as well as greater than 6 weeks of physician directed home exercise regimen starting 4/17.  He has tried medications.  Pain is still  emanating from the thoracic spine and radiating anteriorly.  X-rays have been negative -Counseled on home exercise therapy and supportive care. -MRI of the thoracic spine to evaluate for nerve impingement and consideration of epidural.

## 2021-12-27 NOTE — Assessment & Plan Note (Signed)
Pain is still ongoing.  Originally starting in early April.  He has tried physical therapy as well as greater than 6 weeks of physician directed home exercise regimen starting 4/17.  He has tried medications.  Pain is still emanating from the thoracic spine and radiating anteriorly.  X-rays have been negative -Counseled on home exercise therapy and supportive care. -MRI of the thoracic spine to evaluate for nerve impingement and consideration of epidural.

## 2021-12-29 ENCOUNTER — Ambulatory Visit: Payer: Medicare Other | Admitting: Family Medicine

## 2022-01-03 ENCOUNTER — Encounter: Payer: Self-pay | Admitting: Cardiovascular Disease

## 2022-01-20 ENCOUNTER — Other Ambulatory Visit: Payer: Self-pay

## 2022-01-20 NOTE — Telephone Encounter (Signed)
Need to request refill of this medication from his diabetes doctor.

## 2022-01-20 NOTE — Telephone Encounter (Signed)
Sent to PCP in error.  Diabetes is managed by Dr. Cruzita Lederer.  Please fill, if appropriate.

## 2022-01-20 NOTE — Telephone Encounter (Signed)
Pt's diabetes is managed by another provider.  Please fill, if appropriate.

## 2022-01-25 ENCOUNTER — Ambulatory Visit (INDEPENDENT_AMBULATORY_CARE_PROVIDER_SITE_OTHER): Payer: Medicare Other

## 2022-01-25 ENCOUNTER — Other Ambulatory Visit: Payer: Self-pay | Admitting: Internal Medicine

## 2022-01-25 DIAGNOSIS — I442 Atrioventricular block, complete: Secondary | ICD-10-CM

## 2022-01-25 MED ORDER — EMPAGLIFLOZIN 10 MG PO TABS: 10.0000 mg | ORAL_TABLET | Freq: Every day | ORAL | 0 refills | Status: DC

## 2022-01-26 ENCOUNTER — Encounter: Payer: Self-pay | Admitting: Internal Medicine

## 2022-01-26 ENCOUNTER — Ambulatory Visit (INDEPENDENT_AMBULATORY_CARE_PROVIDER_SITE_OTHER): Payer: Medicare Other | Admitting: Internal Medicine

## 2022-01-26 VITALS — BP 128/82 | HR 98 | Ht 71.0 in | Wt 237.2 lb

## 2022-01-26 DIAGNOSIS — E1165 Type 2 diabetes mellitus with hyperglycemia: Secondary | ICD-10-CM | POA: Diagnosis not present

## 2022-01-26 DIAGNOSIS — E785 Hyperlipidemia, unspecified: Secondary | ICD-10-CM

## 2022-01-26 DIAGNOSIS — E1159 Type 2 diabetes mellitus with other circulatory complications: Secondary | ICD-10-CM | POA: Diagnosis not present

## 2022-01-26 LAB — POCT GLYCOSYLATED HEMOGLOBIN (HGB A1C): Hemoglobin A1C: 7.3 % — AB (ref 4.0–5.6)

## 2022-01-26 MED ORDER — EMPAGLIFLOZIN 10 MG PO TABS
10.0000 mg | ORAL_TABLET | Freq: Every day | ORAL | 3 refills | Status: DC
Start: 2022-01-26 — End: 2023-01-22

## 2022-01-26 MED ORDER — TRULICITY 3 MG/0.5ML ~~LOC~~ SOAJ: 3.0000 mg | SUBCUTANEOUS | 3 refills | Status: DC

## 2022-01-26 NOTE — Patient Instructions (Addendum)
Please continue: - Jardiance 10 mg before breakfast - Trulicity 3 mg weekly - Lantus 42 units at bedtime  Change: - NovoLog  13-15 units before brunch 7-8 units before a larger dinner If you have a snack at night, you may need ~5 units before the snack  NO EATING AFTER DARK.  Can try the following for neuropathy: - alpha-lipoic acid 600 mg twice a day  Please return in 4 months.

## 2022-01-26 NOTE — Progress Notes (Signed)
Patient ID: Mason [name], male   DOB: 29-Jun-1941, 81 y.o.   MRN: [id number]   HPI: Mason [name] is a 81 y.o.-year-old male, initially referred by his PCP, Dr. Anitra Lauth, returning for follow-up for DM2, dx in ~2011, insulin-dependent since 2019, uncontrolled, with long-term complications (CAD with ?h/o AMI, nonischemic CMP, CHF, Afib, cerebrovascular disease with h/o TIA, CKD stage III, peripheral neuropathy, ED).  Last visit 5 months ago. He is here with his wife.  Interim history: No increased urination, nausea, chest pain.  He does have blurry vision.  He sees Dr. Zadie Rhine. At today's visit, he mentions that he has a sweet tooth and his sugars have been higher because of this.  He also wakes up at night to have something sweet.  Reviewed HbA1c levels:  Lab Results  Component Value Date   HGBA1C 7.2 (A) 09/08/2021   HGBA1C 6.7 (A) 12/09/2020   HGBA1C 7.6 (A) 09/09/2020   HGBA1C 9.7 (H) 06/22/2020   HGBA1C 9.0 (H) 03/23/2020   HGBA1C 9.2 (H) 03/16/2020   HGBA1C 9.9 (H) 11/20/2019   HGBA1C 9.7 (H) 05/27/2019   HGBA1C 9.0 (H) 02/26/2019   HGBA1C 10.1 (H) 11/29/2018   HGBA1C 9.0 (A) 08/29/2018   HGBA1C 9.0 08/29/2018   HGBA1C 9.0 (A) 08/29/2018   HGBA1C 9.0 (A) 08/29/2018   HGBA1C 7.8 (A) 05/14/2018   HGBA1C 8.1 (H) 02/06/2018   HGBA1C 8.0 (H) 10/26/2017   HGBA1C 8.2 (H) 07/27/2017   HGBA1C 7.0 03/01/2017   HGBA1C 7.1 11/16/2016   HGBA1C 7.8 (H) 07/03/2016   HGBA1C 6.8 03/14/2016   HGBA1C 7.0 (H) 12/09/2015   HGBA1C 7.4 (H) 09/20/2015   HGBA1C 7.2 (H) 03/25/2015   HGBA1C 7.5 (H) 10/15/2014   HGBA1C 7.1 (H) 06/15/2014   HGBA1C 6.9 (H) 02/12/2014   HGBA1C 8.6 (H) 10/14/2013   HGBA1C 7.5 (H) 07/15/2013  04/11/2021: HbA1c 7.1%  He is on: - Jardiance 10 mg before breakfast - Trulicity 1.5 >> 3  mg weekly - NovoLog pens : 12 (-10-10) >> 6-8  before B-L-D >> 18 units before Brunch and 8 units before D at 6 pm, banana split at 8 pm >> now: 14-18 >> 18-20 units before brunch 8-10  units before dinner >> 0 units >> 7-8 units before a larger dinner If you have a snack at night, you may need ~5 units before the snack - Lantus vial 44 >> 40 >> 36-40 >> 42 units at bedtime She was on metformin in the past.  Pt checks his sugars more than 4 times a day with his freestyle libre CGM:   Previously:  Previously:   Lowest sugar was: 50s >> 48 >> 50s; he has hypoglycemia awareness at 70.  Highest sugar was 400 >> ... 260 >> 250.  Glucometer: Freestyle  Pt's meals are: - Breakfast: cereals, eggs, grits, toast - Lunch: may skip or sandwich - Dinner: meat + veggies + starch - Snacks: apple/cheese >> bananasplits at night Patient saw nutrition a long time ago.  -+ Stage III CKD, last BUN/creatinine:  Lab Results  Component Value Date   BUN 20 12/06/2021   BUN 25 (H) 08/09/2021   CREATININE 1.58 (H) 12/06/2021   CREATININE 1.55 (H) 08/09/2021  04/11/2021: 31/1.86, GFR 37, glucose 133 On Entresto.  -+ HL; last set of lipids: Lab Results  Component Value Date   CHOL 119 12/06/2021   HDL 36.00 (L) 12/06/2021   LDLCALC 46 12/06/2021   LDLDIRECT 103.0 06/22/2020   TRIG 182.0 (H) 12/06/2021  CHOLHDL 3 12/06/2021  04/11/2021: 141/214/45/53 Previously on Zocor 20, but now Crestor 20 mg daily.  - last eye exam was in 03/2021: No DR, + glaucoma.  - He has numbness and tingling in his feet.  On Neurontin 300 mg twice a day.  Up-to-date with foot exam: last visit with podiatry 08/15/2021  -annual foot exam - in 09/2021.  No known FH of DM.  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 Vitamin B12 level: 04/11/2021: 429  Drinks beer 2-3x a day, but not quite every day.  ROS: + see HPI Neurological: no tremors/+ numbness/+ tingling/no dizziness  I reviewed pt's medications, allergies, PMH, social hx, family hx, and changes were documented in the history of present illness.  Otherwise, unchanged from my initial visit note.  Past Medical History:  Diagnosis Date   AICD (automatic cardioverter/defibrillator) present 2018   MDT CRT-D.  Fatigue-->completely pacer dependent.  Pacer settings adjusted 12/2017 to allow more chronotrophc variance with ADL's//exertion.   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   02/2016 EF 40%, diffuse hypokinesis, grade 2 DD.  Myoc perf imaging showed EF 32% 04/2016.  Pt upgraded to CRT-D 01/04/17.   Chronic renal insufficiency, stage III (moderate) (HCC) 2015   CrCl about 60 ml/min   Complete heart block (HCC)    Has dual chamber pacer.   COVID-19 virus infection 01/05/2021   paxlovid   Depression    DOE (dyspnea on exertion)    NYHA class II/III CHF   Dyspnea 2021   with exertion, bending over   Episodic low back pain 01/22/2013   w/intermittent radiculitis (12/2014 his neurologist referred him to pain mgmt for epidural steroid injection)   Erectile dysfunction 2019   due to zoloft--urol rx'd viagra   GERD (gastroesophageal reflux disease)    H/O tilt table evaluation 79/89/2119   negative   Helicobacter pylori gastritis 01/2016   History of adenomatous polyp of colon 10/12/2011   Dr. [name] (3 right side of colon- tubular adenomas removed)   History of cardiovascular stress test 05/28/2012   no ischemia, EF 37%, imaging results are unchanged and within normal variance   History of chronic prostatitis    History of kidney stones    History of vertigo    + Hx of posterior HA's.  Neuro (Dr. Erling Cruz) eval 2011.  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   Hyperlipidemia    Hypertension    Lumbar  spondylosis    lumbosacral radiculopathy at L4 by EMG testing, right foot drop (neurologist is Dr. Linus Salmons with [facility] in [place])--neurologist referred him to neurosurgery   Migraine    "used to have them all the time; none for years" (01/04/2017)   Myocardial infarction [facility] [address]) ?1970s   not entirely certain of this   Nephrolithiasis 07/2012   Left UVJ 2 mm stone with dilation of renal collecting system and slight hydroureter on right   Neuropathy    NICM (nonischemic cardiomyopathy) ([place])    a. 02/2018 Cath: LM nl, LAD min irregs, LCX no, RCA 20d. ERDE08. Fick CO/CI 4.4/2.0.   Osteoarthritis, multiple sites  Shoulders, back, knees   Pacemaker 02/05/2012   dual chamber, complete heart block, meddtronic revo, lasted checked 12/2015.  Since no CAD on cath 05/2016, cards recommends upgrade to CRT-D.   Permanent atrial fibrillation ([place])    DCCV 07/09/13-converted, lasted two days, then back into afib--needs lifetime anticoagulation (Xarelto as of 09/2014)   Prostate cancer screening 09/2017   done by urol annually (normal prostate exam documented + PSA 0.84 as of 10/01/17 urol f/u.  10/2018 urol f/u PSA 0.6, no prostate nodule.   Rectus diastasis    Right ankle sprain 08/2017   w/distal fibula avulsion fx noted on u/s but not plain film-(Dr. Hudnall).   Skin cancer of arm, left    "burned it off" (01/04/2017)   TIA (transient ischemic attack)    L face and L arm weakness. Peri procedural->a. 03/22/2018 following cath. CT head neg. No MRI b/c has pacer. Likely due to embolus to distal branch of RMCA   Type II diabetes mellitus ([place])    Past Surgical History:  Procedure Laterality Date   ABI's Bilateral 05/21/2018   normal   BACK SURGERY     BIV ICD INSERTION CRT-D N/A 01/04/2017   Procedure: BiV ICD ;  Surgeon: Constance Haw, MD;  Location: [place] [facility];  Service: Cardiovascular;  Laterality: N/A;   CARDIAC CATHETERIZATION N/A 06/14/2016   Minimal  nonobstructive dz, EF 25-35%.  Procedure: Left Heart Cath and Coronary Angiography;  Surgeon: Peter M Martinique, MD;  Location: [place] [facility];  Service: Cardiovascular;  Laterality: N/A;   CARDIOVASCULAR STRESS TEST  2012   2012 nuclear perfusion study: low risk scan; 04/2016 normal myocardial perfusion imaging, EF 32%.   CARDIOVERSION  07/09/2012   Procedure: CARDIOVERSION;  Surgeon: [name], MD;  Location: [place] [facility];  Service: Cardiovascular;  Laterality: N/A;   CATARACT EXTRACTION W/ INTRAOCULAR LENS IMPLANT & ANTERIOR VITRECTOMY, BILATERAL Bilateral    CIRCUMCISION N/A 03/23/2020   Procedure: CIRCUMCISION ADULT;  Surgeon: Remi Haggard, MD;  Location: [facility];  Service: Urology;  Laterality: N/A;   COLONOSCOPY W/ POLYPECTOMY  approx 2006; repeated 09/2011   Polyps on 2013 EGD as well, repeat 12/2014   COLONOSCOPY WITH PROPOFOL N/A 07/08/2021   adenoma x 1. Procedure: COLONOSCOPY WITH PROPOFOL;  Surgeon: Carol Ada, MD;  Location: [facility];  Service: Endoscopy;  Laterality: N/A;   ESOPHAGOGASTRODUODENOSCOPY  10/18/2006   Done due to chronic GERD: Normal, bx showed no barrett's esophagus (Dr. [name])   [place] Left 10/02/2016   Procedure: LEFT RING FINGER WOUND EXPORATION AND FLEXOR TENDON REPAIR AND NERVE REPAIR;  Surgeon: Milly Jakob, MD;  Location: [facility];  Service: Orthopedics;  Laterality: Left;   INSERT / REPLACE / REMOVE PACEMAKER  02/05/2012   dual chamber, sinus node dysfunction, sinus arrest, PAF, Medtronic Revo serial#-[id number] H: last checked 05/2015   LUMBAR LAMINECTOMY Left 1976   L4-5   PACEMAKER REMOVAL  01/04/2017   PERMANENT PACEMAKER INSERTION N/A 02/05/2012   Procedure: PERMANENT PACEMAKER INSERTION;  Surgeon: [name], MD; Generator Medtronic [place] model [place] serial number [id number] H Laterality: N/A;   POLYPECTOMY  07/08/2021   Procedure: POLYPECTOMY;  Surgeon: Carol Ada, MD;  Location[facility];  Service: Endoscopy;;    RETINAL DETACHMENT SURGERY Left ~ [place] Left 2018   Left shoulder reverse [name] in [place]).   RIGHT/LEFT HEART CATH AND CORONARY ANGIOGRAPHY N/A 03/22/2018   EF 30-35%, no CAD.  Procedure: RIGHT/LEFT HEART CATH  AND CORONARY ANGIOGRAPHY;  Surgeon: Jolaine Artist, MD;  Location: [place] [facility];  Service: Cardiovascular;  Laterality: N/A;   TRANSTHORACIC ECHOCARDIOGRAM  08/25/10; 05/2012; 03/23/16;12/2017   mild asymmetric LVH, normal systolic function, normal diastolic fxn, mild-to-mod mitral regurg, mild aortic valve sclerosis and trace AI, mild aortic root dilatation. 2014 f/u showed EF 40-45%, mod LAE, A FIB.  02/2016 EF 40%, diffuse hypokinesis, grade 2 DD. 12/2017 EF 35-40%,diffuse hypokin,grd III DD, mild MR   WISDOM TOOTH EXTRACTION  06/27/2021   EF 50-55%, mild aortic root/ascending aorta dilatation (41-42 mm).   Social History   Socioeconomic History   Marital status: Married    Spouse name: Not on file   Number of children: Not on file   Years of education: Not on file   Highest education level: 12th grade  Occupational History   Not on file  Tobacco Use   Smoking status: Never   Smokeless tobacco: Never  Vaping Use   Vaping Use: Never used  Substance and Sexual Activity   Alcohol use: Yes    Comment: occ   Drug use: No   Sexual activity: Not on file  Other Topics Concern   Not on file  Social History Narrative   Not on file   Social Determinants of Health   Financial Resource Strain: Low Risk  (08/31/2021)   Overall Financial Resource Strain (CARDIA)    Difficulty of Paying Living Expenses: Not hard at all  Food Insecurity: No Food Insecurity (08/31/2021)   Hunger Vital Sign    Worried About Running Out of Food in the Last Year: Never true    [place] in the Last Year: Never true  Transportation Needs: No Transportation Needs (08/31/2021)   PRAPARE - Hydrologist (Medical): No     Lack of Transportation (Non-Medical): No  Physical Activity: Inactive (08/31/2021)   Exercise Vital Sign    Days of Exercise per Week: 0 days    Minutes of Exercise per Session: 0 min  Stress: No Stress Concern Present (08/31/2021)   [place]    Feeling of Stress : Only a little  Social Connections: Socially Integrated (08/31/2021)   Social Connection and Isolation Panel [NHANES]    Frequency of Communication with Friends and Family: More than three times a week    Frequency of Social Gatherings with Friends and Family: More than three times a week    Attends Religious Services: More than 4 times per year    Active Member of [employer] or Organizations: Yes    Attends Archivist Meetings: 1 to 4 times per year    Marital Status: Married  Human resources officer Violence: Not At Risk (08/31/2021)   Humiliation, Afraid, Rape, and Kick questionnaire    Fear of Current or Ex-Partner: No    Emotionally Abused: No    Physically Abused: No    Sexually Abused: No   Current Outpatient Medications on File Prior to Visit  Medication Sig Dispense Refill   acetaminophen (TYLENOL) 325 MG tablet Take 650 mg by mouth every 6 (six) hours as needed for moderate pain.     BD INSULIN SYRINGE U/F 31G X 5/16" 1 ML MISC USE DAILY AS DIRECTED 100 each 3   COMBIGAN 0.2-0.5 % ophthalmic solution Place 1 drop into both eyes at bedtime.      Continuous Blood Gluc Sensor (FREESTYLE LIBRE SENSOR SYSTEM) MISC USE TO CHECK BLOOD  SUGARS 4 TIMES DAILY. Before breakfast, before lunch, before supper, and at bedtime 1 each 3   diclofenac Sodium (VOLTAREN) 1 % GEL Apply 4 g topically 4 (four) times daily. 100 g 1   Dulaglutide (TRULICITY) 3 XH/3.7JI SOPN Inject 3 mg into the skin once a week. 6 mL 1   empagliflozin (JARDIANCE) 10 MG TABS tablet Take 1 tablet (10 mg total) by mouth daily. 90 tablet 0   ENTRESTO 97-103 MG TAKE 1 TABLET TWO TIMES A DAY 180 tablet 3    eplerenone (INSPRA) 25 MG tablet Take 1 tablet (25 mg total) by mouth daily. 90 tablet 3   furosemide (LASIX) 20 MG tablet Take 1 tablet (20 mg total) by mouth daily as needed for edema (For a weight gain of 3 pounds overnight or 5 pounds in one week). (Patient not taking: Reported on 11/07/2021) 30 tablet 5   gabapentin (NEURONTIN) 300 MG capsule TAKE 2 CAPSULES TWICE A DAY (Patient taking differently: Take 1,200 mg by mouth 2 (two) times daily.) 360 capsule 3   insulin aspart (NOVOLOG FLEXPEN) 100 UNIT/ML FlexPen INJECT 6-8 UNITS UNDER THE SKIN AT THE TIME OF EACH MEAL (Patient taking differently: Pt stated 20 units in the a.m only   UNDER THE SKIN AT THE TIME OF EACH MEAL) 15 mL 4   insulin glargine (LANTUS) 100 UNIT/ML injection Inject 0.4-0.45 mLs (40-45 Units total) into the skin daily. 10 mL 0   Insulin Pen Needle (SURE COMFORT PEN NEEDLES) 31G X 5 MM MISC USE TO INJECT INSULIN UNDER THE SKIN 100 each 11   ketorolac (ACULAR) 0.5 % ophthalmic solution Place 1 drop into the left eye 2 (two) times daily. 5 mL 1   latanoprost (XALATAN) 0.005 % ophthalmic solution Place 1 drop into both eyes at bedtime.   12   lidocaine (LIDODERM) 5 % PLACE 1 PATCH ON THE SKIN DAILY. REMOVE AND DISCARD PATCH WITHIN 12 HOURS OR AS DIRECTED BY DOCTOR 90 patch 1   meclizine (ANTIVERT) 25 MG tablet TAKE 1 TABLET THREE TIMES A DAY AS NEEDED FOR DIZZINESS. 270 tablet 3   metoprolol succinate (TOPROL-XL) 50 MG 24 hr tablet TAKE 1 TABLET DAILY. TAKE WITH OR IMMEDIATELY FOLLOWING A MEAL Strength: 50 mg 90 tablet 1   omeprazole (PRILOSEC) 40 MG capsule Take 1 capsule (40 mg total) by mouth daily. 90 capsule 1   oxybutynin (DITROPAN XL) 10 MG 24 hr tablet Take 1 tablet (10 mg total) by mouth at bedtime. 90 tablet 3   predniSONE (DELTASONE) 5 MG tablet Take 6 pills for first day, 5 pills second day, 4 pills third day, 3 pills fourth day, 2 pills the fifth day, and 1 pill sixth day. 21 tablet 0   rOPINIRole (REQUIP) 1 MG tablet  Take 1 tablet (1 mg total) by mouth daily. 90 tablet 3   rosuvastatin (CRESTOR) 20 MG tablet Take 1 tablet (20 mg total) by mouth daily. 90 tablet 1   sertraline (ZOLOFT) 100 MG tablet Take 1 tablet (100 mg total) by mouth daily. 90 tablet 3   XARELTO 20 MG TABS tablet TAKE 1 TABLET DAILY WITH SUPPER (Patient taking differently: Take 20 mg by mouth daily with supper.) 90 tablet 3   No current facility-administered medications on file prior to visit.   No Known Allergies Family History  Problem Relation Age of Onset   Heart failure Mother    Stroke Mother    Stroke Father    Heart disease Sister  Heart disease Sister    Cancer Sister        liver   Cancer Brother        lung   Cancer Brother        lung   Heart disease Brother    PE: There were no vitals taken for this visit. Wt Readings from Last 3 Encounters:  12/27/21 237 lb (107.5 kg)  12/09/21 237 lb (107.5 kg)  12/08/21 237 lb (107.5 kg)   Constitutional: overweight, in NAD Eyes: PERRLA, EOMI, no exophthalmos ENT: moist mucous membranes, no thyromegaly, no cervical lymphadenopathy Cardiovascular: RRR, No MRG Respiratory: CTA B Musculoskeletal: no deformities Skin: moist, warm, no rashes Neurological: no tremor with outstretched hands  ASSESSMENT: 1. DM2, insulin-dependent, uncontrolled, with complications: - CAD with ?h/o AMI - nonischemic CMP - CHF, s/p AICD - Afib, s/p pacemaker - cerebrovascular disease with h/o TIA - CKD stage III - peripheral neuropathy - ED  No family history of medullary thyroid cancer or personal history of pancreatitis.  2. HL  PLAN:  1. Patient with longstanding, uncontrolled, type 2 diabetes, on oral antidiabetic regimen with SGLT2 inhibitor and also weekly GLP-1 receptor agonist and basal-bolus insulin regimen with fairly good control.  At last visit, HbA1c was slightly higher, at 7.2%.  We adjusted his NovoLog dose since, reviewing the CGM trends, he had some mild  hyperglycemic spikes after meals.  He also had occasional low blood sugars, as low as 48 after his brunch.  Upon questioning, he was not injecting NovoLog 15 minutes before the meal, but only when his sugars are already high after the meal.  Strongly advised him to move NovoLog earlier.  He was not taking NovoLog before dinner blood sugars are only slightly high after this meal so I did not necessarily recommended to start this consistently. CGM interpretation: -At today's visit, we reviewed his CGM downloads: It appears that 72% of values are in target range (goal >70%), while 25% are higher than 180 (goal <25%), and 3% are lower than 70 (goal <4%).  The calculated average blood sugar is 151.  The projected HbA1c for the next 3 months ([facility]) is 6.9%. -Reviewing the CGM trends, it appears that his sugars are fluctuating in the higher end of the normal range with occasional hyperglycemic spikes especially after sweets.  The most significantly is actually around 3 AM, after eating sweets at night.  We discussed about the importance of stopping these.  After he has brunch, his sugars actually drop to the low normal range or even lower, and we discussed that it appears that he is taking too much insulin for this meal.  We will decrease this dose.  He is usually not taking NovoLog before dinner not even for a larger meal, and we discussed that this may be necessary.  However, he plans to change his diet to include even more vegetables and reduce sweets before next visit.  His goal HbA1c for next visit is 6.5%. - I suggested to:  Patient Instructions  Please continue: - Jardiance 10 mg before breakfast - Trulicity 3 mg weekly - Lantus 42 units at bedtime  Change: - NovoLog  13-15 units before brunch 7-8 units before a larger dinner If you have a snack at night, you may need ~5 units before the snack  NO EATING AFTER DARK.  Can try the following for neuropathy: - alpha-lipoic acid 600 mg twice a  day  Please return in 4 months.    - we checked  his HbA1c: 7.3% (higher) - advised to check sugars at different times of the day - 4x a day, rotating check times - advised for yearly eye exams >> he is UTD - he has numbness in his feet.  We discussed about possibly trying alpha lipoid acid.  He is already on Neurontin. - return to clinic in 4 months  2. HL -Reviewed latest lipid panel from 03/2021: Fractions at goal with the exception of high triglycerides -He is on Crestor 20 mg daily, tolerated well  Philemon Kingdom, MD PhD [facility]

## 2022-01-29 LAB — CUP PACEART REMOTE DEVICE CHECK
Battery Remaining Longevity: 24 mo
Battery Voltage: 2.94 V
Brady Statistic AP VP Percent: 0 %
Brady Statistic AP VS Percent: 0 %
Brady Statistic AS VP Percent: 0 %
Brady Statistic AS VS Percent: 0 %
Brady Statistic RA Percent Paced: 0 %
Brady Statistic RV Percent Paced: 99.51 %
Date Time Interrogation Session: 20230709023325
HighPow Impedance: 71 Ohm
Implantable Lead Implant Date: 20130715
Implantable Lead Implant Date: 20180614
Implantable Lead Implant Date: 20180614
Implantable Lead Location: 753858
Implantable Lead Location: 753859
Implantable Lead Location: 753860
Implantable Lead Model: 4598
Implantable Pulse Generator Implant Date: 20180614
Lead Channel Impedance Value: 172.541
Lead Channel Impedance Value: 172.541
Lead Channel Impedance Value: 190.884
Lead Channel Impedance Value: 224.438
Lead Channel Impedance Value: 224.438
Lead Channel Impedance Value: 304 Ohm
Lead Channel Impedance Value: 342 Ohm
Lead Channel Impedance Value: 399 Ohm
Lead Channel Impedance Value: 399 Ohm
Lead Channel Impedance Value: 418 Ohm
Lead Channel Impedance Value: 513 Ohm
Lead Channel Impedance Value: 532 Ohm
Lead Channel Impedance Value: 532 Ohm
Lead Channel Impedance Value: 608 Ohm
Lead Channel Impedance Value: 646 Ohm
Lead Channel Impedance Value: 760 Ohm
Lead Channel Impedance Value: 817 Ohm
Lead Channel Impedance Value: 836 Ohm
Lead Channel Pacing Threshold Amplitude: 0.5 V
Lead Channel Pacing Threshold Amplitude: 0.625 V
Lead Channel Pacing Threshold Pulse Width: 0.4 ms
Lead Channel Pacing Threshold Pulse Width: 1 ms
Lead Channel Sensing Intrinsic Amplitude: 0.625 mV
Lead Channel Sensing Intrinsic Amplitude: 12.625 mV
Lead Channel Sensing Intrinsic Amplitude: 12.625 mV
Lead Channel Setting Pacing Amplitude: 1.25 V
Lead Channel Setting Pacing Amplitude: 2 V
Lead Channel Setting Pacing Pulse Width: 0.4 ms
Lead Channel Setting Pacing Pulse Width: 1 ms
Lead Channel Setting Sensing Sensitivity: 0.3 mV

## 2022-01-31 ENCOUNTER — Other Ambulatory Visit: Payer: Self-pay | Admitting: Internal Medicine

## 2022-01-31 DIAGNOSIS — E1165 Type 2 diabetes mellitus with hyperglycemia: Secondary | ICD-10-CM

## 2022-02-01 ENCOUNTER — Other Ambulatory Visit: Payer: Self-pay | Admitting: Family Medicine

## 2022-02-13 ENCOUNTER — Ambulatory Visit (INDEPENDENT_AMBULATORY_CARE_PROVIDER_SITE_OTHER): Payer: Medicare Other | Admitting: Cardiovascular Disease

## 2022-02-13 ENCOUNTER — Encounter: Payer: Self-pay | Admitting: Cardiovascular Disease

## 2022-02-13 VITALS — BP 96/72 | HR 75 | Ht 71.0 in | Wt 232.4 lb

## 2022-02-13 DIAGNOSIS — D6869 Other thrombophilia: Secondary | ICD-10-CM | POA: Diagnosis not present

## 2022-02-13 DIAGNOSIS — I5022 Chronic systolic (congestive) heart failure: Secondary | ICD-10-CM | POA: Diagnosis not present

## 2022-02-13 DIAGNOSIS — Z95810 Presence of automatic (implantable) cardiac defibrillator: Secondary | ICD-10-CM

## 2022-02-13 DIAGNOSIS — E1151 Type 2 diabetes mellitus with diabetic peripheral angiopathy without gangrene: Secondary | ICD-10-CM | POA: Diagnosis not present

## 2022-02-13 DIAGNOSIS — I10 Essential (primary) hypertension: Secondary | ICD-10-CM

## 2022-02-13 DIAGNOSIS — E669 Obesity, unspecified: Secondary | ICD-10-CM

## 2022-02-13 DIAGNOSIS — I442 Atrioventricular block, complete: Secondary | ICD-10-CM | POA: Diagnosis not present

## 2022-02-13 DIAGNOSIS — I4821 Permanent atrial fibrillation: Secondary | ICD-10-CM

## 2022-02-13 DIAGNOSIS — E785 Hyperlipidemia, unspecified: Secondary | ICD-10-CM

## 2022-02-13 DIAGNOSIS — I739 Peripheral vascular disease, unspecified: Secondary | ICD-10-CM | POA: Diagnosis not present

## 2022-02-13 NOTE — Patient Instructions (Signed)

## 2022-02-14 NOTE — Progress Notes (Signed)
`   Cardiology Office Note    Date:  02/14/2022   ID:  Mason [name], DOB 08-May-1941, MRN [id number]  PCP:  Tammi Sou, MD  Cardiologist:   [name], MD   Chief Complaint  Patient presents with   Congestive Heart Failure    History of Present Illness:  Mason [name] is a 81 y.o. male with nonischemic cardiomyopathy, permanent atrial fibrillation and complete heart block who underwent implantation of a CRT-D in June 2018 for deteriorating left ventricular systolic function.  He is pacemaker dependent.  Generally feels well.  Has NYHA functional class I, occasionally class II.  He may get dyspnea when climbing a hill but he can use a push mower in his yard for small patches of grass.  He complains of some dizziness if he jumps up after working stooped over on a flower bed, suggesting orthostatic hypotension, but also has dizziness when he lays back in bed, more consistent with vertigo.  The dizziness is never severe and resolves quickly.  He has not experienced syncope.  Denies lower extremity edema, orthopnea or PND.  He has not had any chest pain at rest or with activity.  His most recent echocardiogram performed last December showed improvement in LV systolic function with ejection fraction that was estimated at 50-55% (53% by 3D volume measurement) he has mild dilation of the ascending aorta at a maximum of about 42 mm by echo.  A similar measurement was obtained by CT of the chest at 4.3-4.2 cm (Nov 25, 2021).  Device interrogation shows normal function.  He has 1.9 years of remaining longevity on this generator.  He has 99.7% biventricular pacing with good heart rate histogram distribution.  Lead parameters are normal.  He has not had any meaningful episodes of high ventricular rate.  OptiVol showed slight upward deviation consistent with fluid gain in the first 2 weeks or so of this month, but has returned to normal.  There was an interruption in his Jardiance prescription during  that time.   Left ventricular pacing is in the LV 3-RV coil configuration.  He is pacemaker dependent without any escape rhythm when pacing is taken down to 30 bpm. Presenting rhythm is atrial fibrillation with biventricular pacing.  His ECG shows a distinctive positive R wave in lead V1, although the QRS remains quite broad at about 180 ms (RBBB plus LAFB pattern).  Glycemic control is fair with the most recent hemoglobin A1c of 7.3% (slight deterioration from 6.7% last year).  Continues to have a low HDL at 36 and has mild hypertriglyceridemia but excellent LDL at 46.  On maximum dose Entresto, on SGLT2 inhibitor and on metoprolol succinate and maximum tolerated dose.  Switched from spironolactone to eplerenone due to gynecomastia, with resolution of the side effects.  He has a prescription for "as needed" extra doses of furosemide but has not required this in a year or longer.  He does not have coronary or other vascular disorders, but has numerous risk factors including type 2 diabetes, hyperlipidemia, mild obesity. He has a history of gout.  He has gained about 20 pounds since his last appointment.  He is now taking both Lantus and Jardiance.  The cause of Don's cardiomyopathy remains uncertain.  He initially presented with a "heart attack" and was in the [facility] in [facility] for 3 to 4 days in 1976.  He has had a depressed left ventricular systolic function for many years.  Cardiac  catheterization most recently performed in 2019 does show nonobstructive atherosclerosis.[name]  He has a Water engineer including 2 stints in [place] and he knows that he was exposed to [employer].  He underwent right and left heart catheterization for optimization of heart failure therapy on March 22, 2018.  Right atrial pressure was 7 mmHg, wedge pressure was 13 and PA pressure was 30/10 (mean 22) millimeters Hg.  The cardiac index was 2.0.  The coronary arteries had only minimal  atherosclerosis.  Radial artery access was difficult.  On the catheterization he developed transient weakness of the left upper extremity and left facial droop, from which he recovered fully thankfully.  His dose of beta-blocker was reduced.  He is not on diuretics.  He subsequently underwent cardiopulmonary stress testing on September 18 and did quite well with a mildly reduced peak VO2 of 16.8 mL/kg/min (79% of control).  There was still evidence of drug-induced chronotropic incompetence with a maximum heart rate only reached 74% of predicted.     Past Medical History:  Diagnosis Date   AICD (automatic cardioverter/defibrillator) present 2018   MDT CRT-D.  Fatigue-->completely pacer dependent.  Pacer settings adjusted 12/2017 to allow more chronotrophc variance with ADL's//exertion.   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   02/2016 EF 40%, diffuse hypokinesis, grade 2 DD.  Myoc perf imaging showed EF 32% 04/2016.  Pt upgraded to CRT-D 01/04/17.   Chronic renal insufficiency, stage III (moderate) (HCC) 2015   CrCl about 60 ml/min   Complete heart block (HCC)    Has dual chamber pacer.   COVID-19 virus infection 01/05/2021   paxlovid   Depression    DOE (dyspnea on exertion)    NYHA class II/III CHF   Dyspnea 2021   with exertion, bending over   Episodic low back pain 01/22/2013   w/intermittent radiculitis (12/2014 his neurologist referred him to pain mgmt for epidural steroid injection)   Erectile dysfunction 2019   due to zoloft--urol rx'd viagra   GERD (gastroesophageal reflux disease)    H/O tilt table evaluation 62/83/1517   negative   Helicobacter pylori gastritis 01/2016   History of adenomatous polyp of colon  10/12/2011   Dr. [name] (3 right side of colon- tubular adenomas removed)   History of cardiovascular stress test 05/28/2012   no ischemia, EF 37%, imaging results are unchanged and within normal variance   History of chronic prostatitis    History of kidney stones    History of vertigo    + Hx of posterior HA's.  Neuro (Dr. Erling Cruz) eval 2011.  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   Hyperlipidemia    Hypertension    Lumbar spondylosis    lumbosacral radiculopathy at L4 by EMG testing, right foot drop (neurologist is Dr. Linus Salmons with [facility] in [place])--neurologist referred him to neurosurgery   Migraine    "used to have them all the time; none for years" (01/04/2017)   Myocardial infarction [facility]) ?1970s   not entirely certain of this   Nephrolithiasis 07/2012   Left UVJ 2 mm stone with dilation of renal collecting system and slight hydroureter on right   Neuropathy  NICM (nonischemic cardiomyopathy) ([place])    a. 02/2018 Cath: LM nl, LAD min irregs, LCX no, RCA 20d. [id number]. Fick CO/CI 4.4/2.0.   Osteoarthritis, multiple sites    Shoulders, back, knees   Pacemaker 02/05/2012   dual chamber, complete heart block, meddtronic revo, lasted checked 12/2015.  Since no CAD on cath 05/2016, cards recommends upgrade to CRT-D.   Permanent atrial fibrillation ([place])    DCCV 07/09/13-converted, lasted two days, then back into afib--needs lifetime anticoagulation (Xarelto as of 09/2014)   Prostate cancer screening 09/2017   done by urol annually (normal prostate exam documented + PSA 0.84 as of 10/01/17 urol f/u.  10/2018 urol f/u PSA 0.6, no prostate nodule.   Rectus diastasis    Right ankle sprain 08/2017   w/distal fibula avulsion fx noted on u/s but not plain film-(Dr. Hudnall).   Skin cancer of arm, left    "burned it off" (01/04/2017)   TIA (transient ischemic attack)    L face and L arm weakness. Peri  procedural->a. 03/22/2018 following cath. CT head neg. No MRI b/c has pacer. Likely due to embolus to distal branch of RMCA   Type II diabetes mellitus ([place])     Past Surgical History:  Procedure Laterality Date   ABI's Bilateral 05/21/2018   normal   BACK SURGERY     BIV ICD INSERTION CRT-D N/A 01/04/2017   Procedure: BiV ICD ;  Surgeon: Constance Haw, MD;  Location: [facility];  Service: Cardiovascular;  Laterality: N/A;   CARDIAC CATHETERIZATION N/A 06/14/2016   Minimal nonobstructive dz, EF 25-35%.  Procedure: Left Heart Cath and Coronary Angiography;  Surgeon: Peter M Martinique, MD;  Location: [place] [facility];  Service: Cardiovascular;  Laterality: N/A;   CARDIOVASCULAR STRESS TEST  2012   2012 nuclear perfusion study: low risk scan; 04/2016 normal myocardial perfusion imaging, EF 32%.   CARDIOVERSION  07/09/2012   Procedure: CARDIOVERSION;  Surgeon: [name], MD;  Location: [facility];  Service: Cardiovascular;  Laterality: N/A;   CATARACT EXTRACTION W/ INTRAOCULAR LENS IMPLANT & ANTERIOR VITRECTOMY, BILATERAL Bilateral    CIRCUMCISION N/A 03/23/2020   Procedure: CIRCUMCISION ADULT;  Surgeon: Remi Haggard, MD;  Location: [facility];  Service: Urology;  Laterality: N/A;   COLONOSCOPY W/ POLYPECTOMY  approx 2006; repeated 09/2011   Polyps on 2013 EGD as well, repeat 12/2014   COLONOSCOPY WITH PROPOFOL N/A 07/08/2021   adenoma x 1. Procedure: COLONOSCOPY WITH PROPOFOL;  Surgeon: Carol Ada, MD;  Location: [facility];  Service: Endoscopy;  Laterality: N/A;   ESOPHAGOGASTRODUODENOSCOPY  10/18/2006   Done due to chronic GERD: Normal, bx showed no barrett's esophagus (Dr. [name])   [place] Left 10/02/2016   Procedure: LEFT RING FINGER WOUND EXPORATION AND FLEXOR TENDON REPAIR AND NERVE REPAIR;  Surgeon: Milly Jakob, MD;  Location: [facility];  Service: Orthopedics;  Laterality: Left;   INSERT / REPLACE / REMOVE PACEMAKER  02/05/2012   dual chamber, sinus  node dysfunction, sinus arrest, PAF, Medtronic Revo serial#[id number] H: last checked 05/2015   LUMBAR LAMINECTOMY Left 1976   L4-5   PACEMAKER REMOVAL  01/04/2017   PERMANENT PACEMAKER INSERTION N/A 02/05/2012   Procedure: PERMANENT PACEMAKER INSERTION;  Surgeon: [name], MD; Generator Medtronic [place] model [place] serial number [id number] H Laterality: N/A;   POLYPECTOMY  07/08/2021   Procedure: POLYPECTOMY;  Surgeon: Carol Ada, MD;  Location[facility];  Service: Endoscopy;;   RETINAL DETACHMENT SURGERY Left ~ [place] Left 2018  Left shoulder reverse [name] in [place]).   RIGHT/LEFT HEART CATH AND CORONARY ANGIOGRAPHY N/A 03/22/2018   EF 30-35%, no CAD.  Procedure: RIGHT/LEFT HEART CATH AND CORONARY ANGIOGRAPHY;  Surgeon: Jolaine Artist, MD;  Location: [place] [facility];  Service: Cardiovascular;  Laterality: N/A;   TRANSTHORACIC ECHOCARDIOGRAM  08/25/10; 05/2012; 03/23/16;12/2017   mild asymmetric LVH, normal systolic function, normal diastolic fxn, mild-to-mod mitral regurg, mild aortic valve sclerosis and trace AI, mild aortic root dilatation. 2014 f/u showed EF 40-45%, mod LAE, A FIB.  02/2016 EF 40%, diffuse hypokinesis, grade 2 DD. 12/2017 EF 35-40%,diffuse hypokin,grd III DD, mild MR   WISDOM TOOTH EXTRACTION  06/27/2021   EF 50-55%, mild aortic root/ascending aorta dilatation (41-42 mm).    Current Medications: Outpatient Medications Prior to Visit  Medication Sig Dispense Refill   BD INSULIN SYRINGE U/F 31G X 5/16" 1 ML MISC USE DAILY AS DIRECTED 100 each 3   COMBIGAN 0.2-0.5 % ophthalmic solution Place 1 drop into both eyes at bedtime.      Continuous Blood Gluc Sensor (FREESTYLE LIBRE SENSOR SYSTEM) MISC USE TO CHECK BLOOD SUGARS 4 TIMES DAILY. Before breakfast, before lunch, before supper, and at bedtime 1 each 3   Dulaglutide (TRULICITY) 3 VF/6.4PP SOPN Inject 3 mg into the skin once a week. 6 mL 3   empagliflozin  (JARDIANCE) 10 MG TABS tablet Take 1 tablet (10 mg total) by mouth daily. 90 tablet 3   ENTRESTO 97-103 MG TAKE 1 TABLET TWO TIMES A DAY 180 tablet 3   eplerenone (INSPRA) 25 MG tablet Take 1 tablet (25 mg total) by mouth daily. 90 tablet 3   gabapentin (NEURONTIN) 300 MG capsule TAKE 2 CAPSULES TWICE A DAY (Patient taking differently: Take 1,200 mg by mouth 2 (two) times daily.) 360 capsule 3   insulin aspart (NOVOLOG FLEXPEN) 100 UNIT/ML FlexPen INJECT 6-8 UNITS UNDER THE SKIN AT THE TIME OF EACH MEAL (Patient taking differently: Pt stated 20 units in the a.m only   UNDER THE SKIN AT THE TIME OF EACH MEAL) 15 mL 4   insulin glargine (LANTUS) 100 UNIT/ML injection Inject 0.4-0.45 mLs (40-45 Units total) into the skin daily. 10 mL 0   Insulin Pen Needle (SURE COMFORT PEN NEEDLES) 31G X 5 MM MISC USE TO INJECT INSULIN UNDER THE SKIN 100 each 11   ketorolac (ACULAR) 0.5 % ophthalmic solution Place 1 drop into the left eye 2 (two) times daily. 5 mL 1   latanoprost (XALATAN) 0.005 % ophthalmic solution Place 1 drop into both eyes at bedtime.   12   lidocaine (LIDODERM) 5 % PLACE 1 PATCH ON THE SKIN DAILY. REMOVE AND DISCARD PATCH WITHIN 12 HOURS OR AS DIRECTED BY DOCTOR 90 patch 1   meclizine (ANTIVERT) 25 MG tablet TAKE 1 TABLET THREE TIMES A DAY AS NEEDED FOR DIZZINESS. 270 tablet 3   metoprolol succinate (TOPROL-XL) 50 MG 24 hr tablet TAKE 1 TABLET DAILY. TAKE WITH OR IMMEDIATELY FOLLOWING A MEAL Strength: 50 mg 90 tablet 1   omeprazole (PRILOSEC) 40 MG capsule Take 1 capsule (40 mg total) by mouth daily. 90 capsule 1   oxybutynin (DITROPAN XL) 10 MG 24 hr tablet Take 1 tablet (10 mg total) by mouth at bedtime. 90 tablet 3   rOPINIRole (REQUIP) 1 MG tablet Take 1 tablet (1 mg total) by mouth daily. 90 tablet 3   rosuvastatin (CRESTOR) 20 MG tablet Take 1 tablet (20 mg total) by mouth daily. 90 tablet  1   sertraline (ZOLOFT) 100 MG tablet Take 1 tablet (100 mg total) by mouth daily. 90 tablet 3    XARELTO 20 MG TABS tablet TAKE 1 TABLET DAILY WITH SUPPER (Patient taking differently: Take 20 mg by mouth daily with supper.) 90 tablet 3   acetaminophen (TYLENOL) 325 MG tablet Take 650 mg by mouth every 6 (six) hours as needed for moderate pain. (Patient not taking: Reported on 02/13/2022)     diclofenac Sodium (VOLTAREN) 1 % GEL Apply 4 g topically 4 (four) times daily. (Patient not taking: Reported on 02/13/2022) 100 g 1   furosemide (LASIX) 20 MG tablet Take 1 tablet (20 mg total) by mouth daily as needed for edema (For a weight gain of 3 pounds overnight or 5 pounds in one week). (Patient not taking: Reported on 11/07/2021) 30 tablet 5   predniSONE (DELTASONE) 5 MG tablet Take 6 pills for first day, 5 pills second day, 4 pills third day, 3 pills fourth day, 2 pills the fifth day, and 1 pill sixth day. (Patient not taking: Reported on 02/13/2022) 21 tablet 0   No facility-administered medications prior to visit.     Allergies:   Patient has no known allergies.   Social History   Socioeconomic History   Marital status: Married    Spouse name: Not on file   Number of children: Not on file   Years of education: Not on file   Highest education level: 12th grade  Occupational History   Not on file  Tobacco Use   Smoking status: Never   Smokeless tobacco: Never  Vaping Use   Vaping Use: Never used  Substance and Sexual Activity   Alcohol use: Yes    Comment: occ   Drug use: No   Sexual activity: Not on file  Other Topics Concern   Not on file  Social History Narrative   Not on file   Social Determinants of Health   Financial Resource Strain: Low Risk  (08/31/2021)   Overall Financial Resource Strain (CARDIA)    Difficulty of Paying Living Expenses: Not hard at all  Food Insecurity: No Food Insecurity (08/31/2021)   Hunger Vital Sign    Worried About Running Out of Food in the Last Year: Never true    [place] in the Last Year: Never true  Transportation Needs: No  Transportation Needs (08/31/2021)   PRAPARE - Hydrologist (Medical): No    Lack of Transportation (Non-Medical): No  Physical Activity: Inactive (08/31/2021)   Exercise Vital Sign    Days of Exercise per Week: 0 days    Minutes of Exercise per Session: 0 min  Stress: No Stress Concern Present (08/31/2021)   [place]    Feeling of Stress : Only a little  Social Connections: Socially Integrated (08/31/2021)   Social Connection and Isolation Panel [NHANES]    Frequency of Communication with Friends and Family: More than three times a week    Frequency of Social Gatherings with Friends and Family: More than three times a week    Attends Religious Services: More than 4 times per year    Active Member of [employer] or Organizations: Yes    Attends Archivist Meetings: 1 to 4 times per year    Marital Status: Married     Family History:  The patient's family history includes Cancer in his brother, brother, and sister; Heart disease in  his brother, sister, and sister; Heart failure in his mother; Stroke in his father and mother.   ROS:   Please see the history of present illness.    ROS All other systems are reviewed and are negative.   PHYSICAL EXAM:   VS:  BP 96/72 (BP Location: Left Arm, Patient Position: Sitting, Cuff Size: Normal)   Pulse 75   Ht '5\' 11"'$  (1.803 m)   Wt 232 lb 6.4 oz (105.4 kg)   SpO2 95%   BMI 32.41 kg/m      General: Alert, oriented x3, no distress, healthy left subclavian device site Head: no evidence of trauma, PERRL, EOMI, no exophtalmos or lid lag, no myxedema, no xanthelasma; normal ears, nose and oropharynx Neck: normal jugular venous pulsations and no hepatojugular reflux; brisk carotid pulses without delay and no carotid bruits Chest: clear to auscultation, no signs of consolidation by percussion or palpation, normal fremitus, symmetrical and full respiratory  excursions Cardiovascular: normal position and quality of the apical impulse, regular rhythm, normal first and second heart sounds, no murmurs, rubs or gallops Abdomen: no tenderness or distention, no masses by palpation, no abnormal pulsatility or arterial bruits, normal bowel sounds, no hepatosplenomegaly Extremities: no clubbing, cyanosis or edema; 2+ radial, ulnar and brachial pulses bilaterally; 2+ right femoral, posterior tibial and dorsalis pedis pulses; 2+ left femoral, posterior tibial and dorsalis pedis pulses; no subclavian or femoral bruits Neurological: grossly nonfocal Psych: Normal mood and affect    Wt Readings from Last 3 Encounters:  02/13/22 232 lb 6.4 oz (105.4 kg)  01/26/22 237 lb 3.2 oz (107.6 kg)  12/27/21 237 lb (107.5 kg)     Studies/Labs Reviewed:   ECHO 06/29/2021:   1. Left ventricular ejection fraction, by estimation, is 50 to 55%. Left  ventricular ejection fraction by 3D volume is 53 %. The left ventricle has  low normal function. The left ventricle has no regional wall motion  abnormalities. Left ventricular  diastolic parameters are indeterminate. The average left ventricular  global longitudinal strain is -9.1 %. The global longitudinal strain is  abnormal.   2. Right ventricular systolic function is mildly reduced. The right  ventricular size is mildly enlarged.   3. Left atrial size was severely dilated.   4. Right atrial size was severely dilated.   5. The mitral valve is normal in structure. Mild mitral valve  regurgitation.   6. The aortic valve is tricuspid. Aortic valve regurgitation is trivial.   7. Aortic dilatation noted. 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 There is mild dilatation of  the ascending aorta, measuring 41 mm.   Comparison(s): Prior images reviewed side by side. The left ventricular  function has improved.   EKG: EKG is ordered today.  Personally reviewed shows atrial  fibrillation with 100% biventricular pacing and a distinct positive R wave in lead V1, QRS 176 ms, QTC 511 ms.  Recent Labs: 06/09/2021: Hemoglobin 16.0; Platelets 129.0 12/06/2021: ALT 13; BUN 20; Creatinine, Ser 1.58; Potassium 4.4; Sodium 139   Lipid Panel    Component Value Date/Time   CHOL 119 12/06/2021 0919   TRIG 182.0 (H) 12/06/2021 0919   HDL 36.00 (L) 12/06/2021 0919   CHOLHDL 3 12/06/2021 0919   VLDL 36.4 12/06/2021 0919   LDLCALC 46 12/06/2021 0919   LDLDIRECT 103.0 06/22/2020 1015     ASSESSMENT:    1. Chronic systolic heart failure ([place])   2. CHB (complete heart  block) ([place])   3. Permanent atrial fibrillation ([place])   4. Acquired thrombophilia ([place])   5. 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 2018   6. Essential hypertension   7. PAD (peripheral artery disease) ([place])   8. Type 2 diabetes mellitus with peripheral vascular disease ([place])   9. Dyslipidemia (high LDL; low HDL)   10. Mild obesity      PLAN:  In order of problems listed above:  CHF: Clinically euvolemic.  OptiVol in normal range, with just a slight upward trend over the last 2-3 weeks or so, but this has returned to normal. On maximum dose Entresto, metoprolol succinate, empagliflozin and eplerenone (switched from spironolactone due to gynecomastia).  Reviewed sodium dietary restriction, daily weight monitoring, symptoms of heart failure exacerbation and "as needed" dosing of loop diuretics, for overnight weight gain of 3 pounds or 5 pounds weight gain in a week. CHB: He is pacemaker dependent. AFib: Permanent arrhythmia.  Severe biatrial enlargement.  Appropriately anticoagulated.  CHADSVasc 5 (age 70, diabetes, heart failure, hypertension).  No history of embolic events. Xarelto: No bleeding events, compliant with the medication. CRT-D: Normal device function.  Good heart rate histogram distribution (100% paced). HTN: Borderline low blood pressure, has minimum  symptoms of orthostatic hypotension. PAD: Asymptomatic/no claudication.  Very mild abnormality in the left lower extremity ABI.   DM/obesity/dyslipidemia: Slight deterioration in glycemic control is associated with a mild increase in triglycerides.  Low HDL is a chronic abnormality.  Weight loss will be beneficial.  Usually has metabolic values monitored at the [facility], or by Dr. Sherre Lain.  They were close to target range last year and probably have improved.  SGLT2 inhibitor should be continued (barring side effects) for both heart failure and diabetes.  On highly effective statin.  LDL in target range.    Medication Adjustments/Labs and Tests Ordered: Current medicines are reviewed at length with the patient today.  Concerns regarding medicines are outlined above.  Medication changes, Labs and Tests ordered today are listed in the Patient Instructions below. Patient Instructions  Medication Instructions:  No changes *If you need a refill on your cardiac medications before your next appointment, please call your pharmacy*   Lab Work: None ordered If you have labs (blood work) drawn today and your tests are completely normal, you will receive your results only by: [place] (if you have MyChart) OR A paper copy in the mail If you have any lab test that is abnormal or we need to change your treatment, we will call you to review the results.   Testing/Procedures: None ordered   Follow-Up: At [facility], you and your health needs are our priority.  As part of our continuing mission to provide you with exceptional heart care, we have created designated Provider Care Teams.  These Care Teams include your primary Cardiologist (physician) and Advanced Practice Providers (APPs -  Physician Assistants and Nurse Practitioners) who all work together to provide you with the care you need, when you need it.  We recommend signing up for the patient portal called "MyChart".  Sign up  information is provided on this After Visit Summary.  MyChart is used to connect with patients for Virtual Visits (Telemedicine).  Patients are able to view lab/test results, encounter notes, upcoming appointments, etc.  Non-urgent messages can be sent to your provider as well.   To learn more about what you can do with MyChart, go to [email or web address].    Your next appointment:   12 month(s)  The format for your next appointment:   In Person  Provider:   [name], MD {    Important Information About Sugar         Signed, [name], MD  02/14/2022 8:42 AM    [facility] [address], [place], [place]  [address] Phone: [phone]; Fax: [phone]

## 2022-02-15 NOTE — Progress Notes (Signed)
Remote ICD transmission.   

## 2022-02-23 ENCOUNTER — Other Ambulatory Visit: Payer: Self-pay | Admitting: Family Medicine

## 2022-03-06 ENCOUNTER — Other Ambulatory Visit: Payer: Self-pay

## 2022-03-06 MED ORDER — OXYBUTYNIN CHLORIDE ER 10 MG PO TB24: 10.0000 mg | ORAL_TABLET | Freq: Every day | ORAL | 1 refills | Status: DC

## 2022-03-13 ENCOUNTER — Encounter (INDEPENDENT_AMBULATORY_CARE_PROVIDER_SITE_OTHER): Payer: Medicare Other | Admitting: Ophthalmology

## 2022-03-23 ENCOUNTER — Encounter (INDEPENDENT_AMBULATORY_CARE_PROVIDER_SITE_OTHER): Payer: Self-pay | Admitting: Ophthalmology

## 2022-03-23 ENCOUNTER — Ambulatory Visit (INDEPENDENT_AMBULATORY_CARE_PROVIDER_SITE_OTHER): Payer: Medicare Other | Admitting: Ophthalmology

## 2022-03-23 DIAGNOSIS — Z8669 Personal history of other diseases of the nervous system and sense organs: Secondary | ICD-10-CM | POA: Diagnosis not present

## 2022-03-23 DIAGNOSIS — H353132 Nonexudative age-related macular degeneration, bilateral, intermediate dry stage: Secondary | ICD-10-CM | POA: Diagnosis not present

## 2022-03-23 DIAGNOSIS — T8529XD Other mechanical complication of intraocular lens, subsequent encounter: Secondary | ICD-10-CM

## 2022-03-23 NOTE — Assessment & Plan Note (Signed)
OS doing well retina attached.

## 2022-03-23 NOTE — Assessment & Plan Note (Signed)
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  I explained the patient that vitrectomy removal of this lens in securing a PCIOL via Yamane scleral tunnel will be highly appropriate and offer steadiness of visual function long-term.

## 2022-03-23 NOTE — Progress Notes (Signed)
03/23/2022     CHIEF COMPLAINT Patient presents for  Chief Complaint  Patient presents with   Retina Evaluation      HISTORY OF PRESENT ILLNESS: Mason [name] is a 81 y.o. male who presents to the clinic today for:   HPI     Retina Evaluation           Laterality: both eyes         Comments   OS with a history of Pseudophakodonesis and developing some blurred vision which is starting to affect his activities of daily living.  6 mths dilate ou color fp oct Pt states his vision has been stable Pt admits to floaters and FOL       Last edited by Hurman Horn, MD on 03/23/2022  2:53 PM.      Referring physician: Tammi Sou, MD [address] [place],  [place] [address]  HISTORICAL INFORMATION:   Selected notes from the MEDICAL RECORD NUMBER    Lab Results  Component Value Date   HGBA1C 7.3 (A) 01/26/2022     CURRENT MEDICATIONS: Current Outpatient Medications (Ophthalmic Drugs)  Medication Sig   COMBIGAN 0.2-0.5 % ophthalmic solution Place 1 drop into both eyes at bedtime.    latanoprost (XALATAN) 0.005 % ophthalmic solution Place 1 drop into both eyes at bedtime.    No current facility-administered medications for this visit. (Ophthalmic Drugs)   Current Outpatient Medications (Other)  Medication Sig   acetaminophen (TYLENOL) 325 MG tablet Take 650 mg by mouth every 6 (six) hours as needed for moderate pain. (Patient not taking: Reported on 02/13/2022)   BD INSULIN SYRINGE U/F 31G X 5/16" 1 ML MISC USE DAILY AS DIRECTED   Continuous Blood Gluc Sensor (FREESTYLE LIBRE SENSOR SYSTEM) MISC USE TO CHECK BLOOD SUGARS 4 TIMES DAILY. Before breakfast, before lunch, before supper, and at bedtime   diclofenac Sodium (VOLTAREN) 1 % GEL Apply 4 g topically 4 (four) times daily. (Patient not taking: Reported on 02/13/2022)   Dulaglutide (TRULICITY) 3 HG/9.9ME SOPN Inject 3 mg into the skin once a week.   empagliflozin (JARDIANCE) 10 MG TABS tablet Take 1  tablet (10 mg total) by mouth daily.   ENTRESTO 97-103 MG TAKE 1 TABLET TWO TIMES A DAY   eplerenone (INSPRA) 25 MG tablet Take 1 tablet (25 mg total) by mouth daily.   furosemide (LASIX) 20 MG tablet Take 1 tablet (20 mg total) by mouth daily as needed for edema (For a weight gain of 3 pounds overnight or 5 pounds in one week). (Patient not taking: Reported on 11/07/2021)   gabapentin (NEURONTIN) 300 MG capsule TAKE 2 CAPSULES TWICE A DAY (Patient taking differently: Take 1,200 mg by mouth 2 (two) times daily.)   insulin aspart (NOVOLOG FLEXPEN) 100 UNIT/ML FlexPen INJECT 6-8 UNITS UNDER THE SKIN AT THE TIME OF EACH MEAL (Patient taking differently: Pt stated 20 units in the a.m only   UNDER THE SKIN AT THE TIME OF EACH MEAL)   insulin glargine (LANTUS) 100 UNIT/ML injection Inject 0.4-0.45 mLs (40-45 Units total) into the skin daily.   Insulin Pen Needle (SURE COMFORT PEN NEEDLES) 31G X 5 MM MISC USE TO INJECT INSULIN UNDER THE SKIN   lidocaine (LIDODERM) 5 % PLACE 1 PATCH ON THE SKIN DAILY. REMOVE AND DISCARD PATCH WITHIN 12 HOURS OR AS DIRECTED BY DOCTOR   meclizine (ANTIVERT) 25 MG tablet TAKE 1 TABLET THREE TIMES A DAY AS NEEDED FOR DIZZINESS.  metoprolol succinate (TOPROL-XL) 50 MG 24 hr tablet TAKE 1 TABLET DAILY. TAKE WITH OR IMMEDIATELY FOLLOWING A MEAL Strength: 50 mg   omeprazole (PRILOSEC) 40 MG capsule Take 1 capsule (40 mg total) by mouth daily.   oxybutynin (DITROPAN XL) 10 MG 24 hr tablet Take 1 tablet (10 mg total) by mouth at bedtime.   rOPINIRole (REQUIP) 1 MG tablet Take 1 tablet (1 mg total) by mouth daily.   rosuvastatin (CRESTOR) 20 MG tablet Take 1 tablet (20 mg total) by mouth daily.   sertraline (ZOLOFT) 100 MG tablet Take 1 tablet (100 mg total) by mouth daily.   XARELTO 20 MG TABS tablet TAKE 1 TABLET DAILY WITH SUPPER (Patient taking differently: Take 20 mg by mouth daily with supper.)   No current facility-administered medications for this visit. (Other)       REVIEW OF SYSTEMS: ROS   Negative for: Constitutional, Gastrointestinal, Neurological, Skin, Genitourinary, Musculoskeletal, HENT, Endocrine, Cardiovascular, Eyes, Respiratory, Psychiatric, Allergic/Imm, Heme/Lymph Last edited by Morene Rankins, CMA on 03/23/2022  2:27 PM.       ALLERGIES No Known Allergies  PAST MEDICAL HISTORY Past Medical History:  Diagnosis Date   AICD (automatic cardioverter/defibrillator) present 2018   MDT CRT-D.  Fatigue-->completely pacer dependent.  Pacer settings adjusted 12/2017 to allow more chronotrophc variance with ADL's//exertion.   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   02/2016 EF 40%, diffuse hypokinesis, grade 2 DD.  Myoc perf imaging showed EF 32% 04/2016.  Pt upgraded to CRT-D 01/04/17.   Chronic renal insufficiency, stage III (moderate) (HCC) 2015   CrCl about 60 ml/min   Complete heart block (HCC)    Has dual chamber pacer.   COVID-19 virus infection 01/05/2021   paxlovid   Depression    DOE (dyspnea on exertion)    NYHA class II/III CHF   Dyspnea 2021   with exertion, bending over   Episodic low back pain 01/22/2013   w/intermittent radiculitis (12/2014 his neurologist referred him to pain mgmt for epidural steroid injection)   Erectile dysfunction 2019   due to zoloft--urol rx'd viagra   GERD (gastroesophageal reflux disease)    H/O tilt table evaluation 42/59/5638   negative   Helicobacter pylori gastritis 01/2016   History of adenomatous polyp of colon 10/12/2011   Dr. [name] (3 right side of colon- tubular adenomas removed)   History of cardiovascular stress test 05/28/2012   no ischemia, EF 37%, imaging results are unchanged and within normal variance    History of chronic prostatitis    History of kidney stones    History of vertigo    + Hx of posterior HA's.  Neuro (Dr. Erling Cruz) eval 2011.  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   Hyperlipidemia    Hypertension    Lumbar spondylosis    lumbosacral radiculopathy at L4 by EMG testing, right foot drop (neurologist is Dr. Linus Salmons with [facility] in [place])--neurologist referred him to neurosurgery   Migraine    "used to have them all the time; none for years" (01/04/2017)   Myocardial infarction [facility]) ?1970s   not entirely certain of this   Nephrolithiasis 07/2012   Left UVJ 2 mm  stone with dilation of renal collecting system and slight hydroureter on right   Neuropathy    NICM (nonischemic cardiomyopathy) ([place])    a. 02/2018 Cath: LM nl, LAD min irregs, LCX no, RCA 20d. [id number]. Fick CO/CI 4.4/2.0.   Osteoarthritis, multiple sites    Shoulders, back, knees   Pacemaker 02/05/2012   dual chamber, complete heart block, meddtronic revo, lasted checked 12/2015.  Since no CAD on cath 05/2016, cards recommends upgrade to CRT-D.   Permanent atrial fibrillation ([place])    DCCV 07/09/13-converted, lasted two days, then back into afib--needs lifetime anticoagulation (Xarelto as of 09/2014)   Prostate cancer screening 09/2017   done by urol annually (normal prostate exam documented + PSA 0.84 as of 10/01/17 urol f/u.  10/2018 urol f/u PSA 0.6, no prostate nodule.   Rectus diastasis    Right ankle sprain 08/2017   w/distal fibula avulsion fx noted on u/s but not plain film-(Dr. Hudnall).   Skin cancer of arm, left    "burned it off" (01/04/2017)   TIA (transient ischemic attack)    L face and L arm weakness. Peri procedural->a. 03/22/2018 following cath. CT head neg. No MRI b/c has pacer. Likely due to embolus to distal branch of RMCA   Type II diabetes mellitus ([place])    Past Surgical History:  Procedure Laterality  Date   ABI's Bilateral 05/21/2018   normal   BACK SURGERY     BIV ICD INSERTION CRT-D N/A 01/04/2017   Procedure: BiV ICD ;  Surgeon: Constance Haw, MD;  Location: [place] [facility];  Service: Cardiovascular;  Laterality: N/A;   CARDIAC CATHETERIZATION N/A 06/14/2016   Minimal nonobstructive dz, EF 25-35%.  Procedure: Left Heart Cath and Coronary Angiography;  Surgeon: Peter M Martinique, MD;  Location: [place] [facility];  Service: Cardiovascular;  Laterality: N/A;   CARDIOVASCULAR STRESS TEST  2012   2012 nuclear perfusion study: low risk scan; 04/2016 normal myocardial perfusion imaging, EF 32%.   CARDIOVERSION  07/09/2012   Procedure: CARDIOVERSION;  Surgeon: [name], MD;  Location: [place] [facility];  Service: Cardiovascular;  Laterality: N/A;   CATARACT EXTRACTION W/ INTRAOCULAR LENS IMPLANT & ANTERIOR VITRECTOMY, BILATERAL Bilateral    CIRCUMCISION N/A 03/23/2020   Procedure: CIRCUMCISION ADULT;  Surgeon: Remi Haggard, MD;  Location: [facility];  Service: Urology;  Laterality: N/A;   COLONOSCOPY W/ POLYPECTOMY  approx 2006; repeated 09/2011   Polyps on 2013 EGD as well, repeat 12/2014   COLONOSCOPY WITH PROPOFOL N/A 07/08/2021   adenoma x 1. Procedure: COLONOSCOPY WITH PROPOFOL;  Surgeon: Carol Ada, MD;  Location: [facility];  Service: Endoscopy;  Laterality: N/A;   ESOPHAGOGASTRODUODENOSCOPY  10/18/2006   Done due to chronic GERD: Normal, bx showed no barrett's esophagus (Dr. [name])   [place] Left 10/02/2016   Procedure: LEFT RING FINGER WOUND EXPORATION AND FLEXOR TENDON REPAIR AND NERVE REPAIR;  Surgeon: Milly Jakob, MD;  Location: [facility];  Service: Orthopedics;  Laterality: Left;   INSERT / REPLACE / REMOVE PACEMAKER  02/05/2012   dual chamber, sinus node dysfunction, sinus arrest, PAF, Medtronic Revo serial#-[id number] H: last checked 05/2015   LUMBAR LAMINECTOMY Left 1976   L4-5   PACEMAKER REMOVAL  01/04/2017   PERMANENT PACEMAKER INSERTION N/A  02/05/2012   Procedure: PERMANENT PACEMAKER INSERTION;  Surgeon: [name], MD; Generator Medtronic [place] model [place] serial number [id number] H Laterality: N/A;   POLYPECTOMY  07/08/2021   Procedure: POLYPECTOMY;  Surgeon: Carol Ada, MD;  Location: [facility];  Service: Endoscopy;;   RETINAL DETACHMENT SURGERY Left ~ [place] Left 2018   Left shoulder reverse TSA ([facility] in [place]).   RIGHT/LEFT HEART CATH AND CORONARY ANGIOGRAPHY N/A 03/22/2018   EF 30-35%, no CAD.  Procedure: RIGHT/LEFT HEART CATH AND CORONARY ANGIOGRAPHY;  Surgeon: Jolaine Artist, MD;  Location: [place] [facility];  Service: Cardiovascular;  Laterality: N/A;   TRANSTHORACIC ECHOCARDIOGRAM  08/25/10; 05/2012; 03/23/16;12/2017   mild asymmetric LVH, normal systolic function, normal diastolic fxn, mild-to-mod mitral regurg, mild aortic valve sclerosis and trace AI, mild aortic root dilatation. 2014 f/u showed EF 40-45%, mod LAE, A FIB.  02/2016 EF 40%, diffuse hypokinesis, grade 2 DD. 12/2017 EF 35-40%,diffuse hypokin,grd III DD, mild MR   WISDOM TOOTH EXTRACTION  06/27/2021   EF 50-55%, mild aortic root/ascending aorta dilatation (41-42 mm).    FAMILY HISTORY Family History  Problem Relation Age of Onset   Heart failure Mother    Stroke Mother    Stroke Father    Heart disease Sister    Heart disease Sister    Cancer Sister        liver   Cancer Brother        lung   Cancer Brother        lung   Heart disease Brother     SOCIAL HISTORY Social History   Tobacco Use   Smoking status: Never   Smokeless tobacco: Never  Vaping Use   Vaping Use: Never used  Substance Use Topics   Alcohol use: Yes    Comment: occ   Drug use: No         OPHTHALMIC EXAM:  Base Eye Exam     Visual Acuity (ETDRS)       Right Left   Dist [place] 20/20 20/40 +1         Tonometry (Tonopen, 2:36 PM)       Right Left   Pressure 10 8         Pupils       Pupils    Right PERRL   Left PERRL         Visual Fields       Left Right    Full Full         Extraocular Movement       Right Left    Ortho Ortho    -- -- --  --  --  -- -- --   -- -- --  --  --  -- -- --           Neuro/Psych     Oriented x3: Yes   Mood/Affect: Normal         Dilation     Both eyes: 1.0% Mydriacyl, 2.5% Phenylephrine @ 2:28 PM           Slit Lamp and Fundus Exam     External Exam       Right Left   External Normal Normal         Slit Lamp Exam       Right Left   Lids/Lashes Normal Normal   Conjunctiva/Sclera White and quiet White and quiet   Cornea Clear Clear   Anterior Chamber Deep and quiet Deep,,  Cell, no fibrin, trace pigment, no cell, no flare   Iris Round and reactive Round and reactive   Lens Posterior chamber intraocular lens Posterior chamber intraocular lens in the bag, with Pseudophakodonesis present, lens slightly  tilted loose temporally, in upright position still visual axis functional   Anterior Vitreous Normal Pigment and trace cells         Fundus Exam       Right Left   Posterior Vitreous Posterior vitreous detachment Posterior vitreous detachment, Central vitreous floaters, 1+ cell, RBCs inferiorly, no new blood   Disc Normal Normal   C/D Ratio 0.65 0.7   Macula Normal No topographic distortion   Vessels Normal, , no DR Normal, , no DR   Periphery Normal Good retinopexy temporally, good buckle            IMAGING AND PROCEDURES  Imaging and Procedures for 03/23/22  OCT, Retina - OU - Both Eyes       Right Eye Quality was good. Scan locations included subfoveal. Central Foveal Thickness: 318. Progression has been stable. Findings include normal foveal contour, no IRF, no SRF, retinal drusen .   Left Eye Quality was good. Scan locations included subfoveal. Central Foveal Thickness: 288. Progression has been stable. Findings include no IRF, no SRF, abnormal foveal contour, retinal drusen ,  epiretinal membrane.   Notes OS with minor epiretinal membrane temporal to the fovea, with no foveal distortion will observe.  Minor to moderate vitreous debris left eye  OD with drusen and drusenoid deposits subfoveal.  No signs of active CNVM      Color Fundus Photography Optos - OU - Both Eyes       Right Eye Progression has been stable. Disc findings include increased cup to disc ratio, pallor. Macula : normal observations.   Left Eye Progression has improved. Disc findings include increased cup to disc ratio, pallor. Macula : drusen, retinal pigment epithelium abnormalities.   Notes Old retinopexy temporally left eye, retina attached.              ASSESSMENT/PLAN:  History of retinal detachment OS doing well retina attached.  Pseudophakodonesis OS now symptomatically troubled by the vision would like something to be done perhaps some early winter or next year.  I explained the patient that vitrectomy removal of this lens in securing a PCIOL via Yamane scleral tunnel will be highly appropriate and offer steadiness of visual function long-term.     ICD-10-CM   1. Intermediate stage nonexudative age-related macular degeneration of both eyes  H35.3132 OCT, Retina - OU - Both Eyes    Color Fundus Photography Optos - OU - Both Eyes    2. History of retinal detachment  Z86.69     3. Pseudophakodonesis, subsequent encounter  T85.29XD       1.  OU no change in mild AMD.  2.  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  Ophthalmic Meds Ordered this visit:  No orders of the defined types were placed in this encounter.      Return in about 3 months (around 06/22/2022) for dilate, OS, COLOR FP, OCT.  There are no Patient Instructions on file for this visit.   Explained  the diagnoses, plan, and follow up with the patient and they expressed understanding.  Patient expressed understanding of the importance of proper follow up care.   Clent Demark Elden Brucato M.D. Diseases & Surgery of the Retina and Vitreous [facility]ina [facility] Diabetic [place] 03/23/22     Abbreviations: M myopia (nearsighted); A astigmatism; H  hyperopia (farsighted); P presbyopia; Mrx spectacle prescription;  CTL contact lenses; OD right eye; OS left eye; OU both eyes  XT exotropia; ET esotropia; PEK punctate epithelial keratitis; PEE punctate epithelial erosions; DES dry eye syndrome; MGD meibomian gland dysfunction; ATs artificial tears; PFAT's preservative free artificial tears; [place] nuclear sclerotic cataract; PSC posterior subcapsular cataract; ERM epi-retinal membrane; PVD posterior vitreous detachment; RD retinal detachment; DM diabetes mellitus; DR diabetic retinopathy; NPDR non-proliferative diabetic retinopathy; PDR proliferative diabetic retinopathy; CSME clinically significant macular edema; DME diabetic macular edema; dbh dot blot hemorrhages; CWS cotton wool spot; POAG primary open angle glaucoma; C/D cup-to-disc ratio; HVF humphrey visual field; GVF goldmann visual field; OCT optical coherence tomography; IOP intraocular pressure; BRVO Branch retinal vein occlusion; CRVO central retinal vein occlusion; CRAO central retinal artery occlusion; BRAO branch retinal artery occlusion; RT retinal tear; SB scleral buckle; PPV pars plana vitrectomy; VH Vitreous hemorrhage; PRP panretinal laser photocoagulation; IVK intravitreal kenalog; VMT vitreomacular traction; MH Macular hole;  NVD neovascularization of the disc; NVE neovascularization elsewhere; AREDS age related eye disease study; ARMD age related macular degeneration; POAG primary open angle glaucoma; EBMD epithelial/anterior basement membrane dystrophy; ACIOL anterior chamber intraocular lens; IOL intraocular lens; PCIOL posterior chamber intraocular lens;  Phaco/IOL phacoemulsification with intraocular lens placement; [place] photorefractive keratectomy; LASIK laser assisted in situ keratomileusis; HTN hypertension; DM diabetes mellitus; COPD chronic obstructive pulmonary disease

## 2022-03-28 ENCOUNTER — Other Ambulatory Visit: Payer: Self-pay | Admitting: Cardiovascular Disease

## 2022-03-29 ENCOUNTER — Other Ambulatory Visit: Payer: Self-pay | Admitting: Family Medicine

## 2022-03-31 DIAGNOSIS — N401 Enlarged prostate with lower urinary tract symptoms: Secondary | ICD-10-CM | POA: Diagnosis not present

## 2022-03-31 DIAGNOSIS — R3914 Feeling of incomplete bladder emptying: Secondary | ICD-10-CM | POA: Diagnosis not present

## 2022-04-03 ENCOUNTER — Other Ambulatory Visit: Payer: Self-pay

## 2022-04-03 ENCOUNTER — Encounter: Payer: Self-pay | Admitting: Cardiovascular Disease

## 2022-04-03 ENCOUNTER — Encounter: Payer: Self-pay | Admitting: Cardiology

## 2022-04-03 MED ORDER — RIVAROXABAN 20 MG PO TABS: 20.0000 mg | ORAL_TABLET | Freq: Every day | ORAL | 1 refills | Status: DC

## 2022-04-03 MED ORDER — RIVAROXABAN 20 MG PO TABS: 20.0000 mg | ORAL_TABLET | Freq: Every day | ORAL | 0 refills | Status: DC

## 2022-04-05 DIAGNOSIS — H401123 Primary open-angle glaucoma, left eye, severe stage: Secondary | ICD-10-CM | POA: Diagnosis not present

## 2022-04-05 DIAGNOSIS — Z9889 Other specified postprocedural states: Secondary | ICD-10-CM | POA: Diagnosis not present

## 2022-04-05 DIAGNOSIS — Z961 Presence of intraocular lens: Secondary | ICD-10-CM | POA: Diagnosis not present

## 2022-04-05 DIAGNOSIS — E119 Type 2 diabetes mellitus without complications: Secondary | ICD-10-CM | POA: Diagnosis not present

## 2022-04-05 DIAGNOSIS — H35372 Puckering of macula, left eye: Secondary | ICD-10-CM | POA: Diagnosis not present

## 2022-04-05 LAB — HM DIABETES EYE EXAM

## 2022-04-26 ENCOUNTER — Other Ambulatory Visit: Payer: Self-pay | Admitting: Family Medicine

## 2022-04-26 ENCOUNTER — Ambulatory Visit (INDEPENDENT_AMBULATORY_CARE_PROVIDER_SITE_OTHER): Payer: Medicare Other

## 2022-04-26 DIAGNOSIS — I442 Atrioventricular block, complete: Secondary | ICD-10-CM | POA: Diagnosis not present

## 2022-05-02 ENCOUNTER — Other Ambulatory Visit: Payer: Self-pay | Admitting: Surgery

## 2022-05-02 DIAGNOSIS — I7121 Aneurysm of the ascending aorta, without rupture: Secondary | ICD-10-CM

## 2022-05-02 LAB — CUP PACEART REMOTE DEVICE CHECK
Battery Remaining Longevity: 22 mo
Battery Voltage: 2.92 V
Brady Statistic AP VP Percent: 0 %
Brady Statistic AP VS Percent: 0 %
Brady Statistic AS VP Percent: 0 %
Brady Statistic AS VS Percent: 0 %
Brady Statistic RA Percent Paced: 0 %
Brady Statistic RV Percent Paced: 99.93 %
Date Time Interrogation Session: 20231008012202
HighPow Impedance: 70 Ohm
Implantable Lead Implant Date: 20130715
Implantable Lead Implant Date: 20180614
Implantable Lead Implant Date: 20180614
Implantable Lead Location: 753858
Implantable Lead Location: 753859
Implantable Lead Location: 753860
Implantable Lead Model: 4598
Implantable Pulse Generator Implant Date: 20180614
Lead Channel Impedance Value: 184.154
Lead Channel Impedance Value: 184.154
Lead Channel Impedance Value: 216.367
Lead Channel Impedance Value: 237.865
Lead Channel Impedance Value: 237.865
Lead Channel Impedance Value: 342 Ohm
Lead Channel Impedance Value: 342 Ohm
Lead Channel Impedance Value: 399 Ohm
Lead Channel Impedance Value: 399 Ohm
Lead Channel Impedance Value: 399 Ohm
Lead Channel Impedance Value: 532 Ohm
Lead Channel Impedance Value: 532 Ohm
Lead Channel Impedance Value: 589 Ohm
Lead Channel Impedance Value: 646 Ohm
Lead Channel Impedance Value: 665 Ohm
Lead Channel Impedance Value: 836 Ohm
Lead Channel Impedance Value: 874 Ohm
Lead Channel Impedance Value: 893 Ohm
Lead Channel Pacing Threshold Amplitude: 0.5 V
Lead Channel Pacing Threshold Amplitude: 0.5 V
Lead Channel Pacing Threshold Pulse Width: 0.4 ms
Lead Channel Pacing Threshold Pulse Width: 1 ms
Lead Channel Sensing Intrinsic Amplitude: 0.625 mV
Lead Channel Sensing Intrinsic Amplitude: 12.625 mV
Lead Channel Sensing Intrinsic Amplitude: 12.625 mV
Lead Channel Setting Pacing Amplitude: 1 V
Lead Channel Setting Pacing Amplitude: 2 V
Lead Channel Setting Pacing Pulse Width: 0.4 ms
Lead Channel Setting Pacing Pulse Width: 1 ms
Lead Channel Setting Sensing Sensitivity: 0.3 mV

## 2022-05-04 NOTE — Progress Notes (Signed)
Remote ICD transmission.   

## 2022-05-10 ENCOUNTER — Encounter: Payer: Self-pay | Admitting: Family Medicine

## 2022-05-10 ENCOUNTER — Ambulatory Visit (INDEPENDENT_AMBULATORY_CARE_PROVIDER_SITE_OTHER): Payer: Medicare Other | Admitting: Family Medicine

## 2022-05-10 VITALS — BP 101/69 | HR 79 | Temp 98.2°F | Ht 71.0 in | Wt 231.6 lb

## 2022-05-10 DIAGNOSIS — R0789 Other chest pain: Secondary | ICD-10-CM

## 2022-05-10 DIAGNOSIS — Z23 Encounter for immunization: Secondary | ICD-10-CM | POA: Diagnosis not present

## 2022-05-10 DIAGNOSIS — R109 Unspecified abdominal pain: Secondary | ICD-10-CM

## 2022-05-10 LAB — POCT URINALYSIS DIPSTICK
Bilirubin, UA: NEGATIVE
Blood, UA: NEGATIVE
Glucose, UA: POSITIVE — AB
Ketones, UA: NEGATIVE
Leukocytes, UA: NEGATIVE
Nitrite, UA: NEGATIVE
Protein, UA: NEGATIVE
Spec Grav, UA: 1.02 (ref 1.010–1.025)
Urobilinogen, UA: 0.2 E.U./dL
pH, UA: 6 (ref 5.0–8.0)

## 2022-05-10 NOTE — Progress Notes (Signed)
OFFICE VISIT  05/10/2022  CC:  Chief Complaint  Patient presents with   Flank Pain    Pt c/o pain on R side that started 2 weeks ago. Denies burning with urination,     Patient is a 81 y.o. male who presents for pain in right side of mid back.  HPI: About 2 weeks ago had onset of pain in a fairly focal area in the right side of his back just at the lower level of his ribs.  Hurts when he coughs and when he turns to the left.  He has been coughing quite a bit lately, occasional wheeze, no mucus production or shortness of breath or fever. No rash.  No blood in urine.  Weight has been stable.  ROS as above, plus--> no dizziness, no HAs,  No myalgias .No focal weakness, paresthesias, or tremors.   No dysuria or unusual/new urinary urgency or frequency.  No recent changes in lower legs. No n/v/d or abd pain.  No palpitations.      Past Medical History:  Diagnosis Date   AICD (automatic cardioverter/defibrillator) present 2018   MDT CRT-D.  Fatigue-->completely pacer dependent.  Pacer settings adjusted 12/2017 to allow more chronotrophc variance with ADL's//exertion.   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   02/2016 EF 40%, diffuse hypokinesis, grade 2 DD.  Myoc perf imaging showed EF 32% 04/2016.  Pt upgraded to CRT-D 01/04/17.   Chronic renal insufficiency, stage III (moderate) (HCC) 2015   CrCl about 60 ml/min   Complete heart block (HCC)    Has dual chamber pacer.   COVID-19 virus infection 01/05/2021   paxlovid   Depression    DOE (dyspnea on exertion)    NYHA class II/III CHF   Dyspnea 2021   with exertion, bending over   Episodic low back pain 01/22/2013   w/intermittent radiculitis (12/2014 his neurologist  referred him to pain mgmt for epidural steroid injection)   Erectile dysfunction 2019   due to zoloft--urol rx'd viagra   GERD (gastroesophageal reflux disease)    H/O tilt table evaluation 11/94/1740   negative   Helicobacter pylori gastritis 01/2016   History of adenomatous polyp of colon 10/12/2011   Dr. [name] (3 right side of colon- tubular adenomas removed)   History of cardiovascular stress test 05/28/2012   no ischemia, EF 37%, imaging results are unchanged and within normal variance   History of chronic prostatitis    History of kidney stones    History of vertigo    + Hx of posterior HA's.  Neuro (Dr. Erling Cruz) eval 2011.  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   Hyperlipidemia    Hypertension    Lumbar spondylosis    lumbosacral radiculopathy at L4 by EMG testing, right foot drop (neurologist is Dr. Linus Salmons with [facility] in [place])--neurologist referred him to neurosurgery   Migraine    "used to have them all the time; none for years" (01/04/2017)   Myocardial infarction [facility]) ?1970s   not entirely certain of this   Nephrolithiasis 07/2012   Left UVJ 2 mm stone with dilation of renal collecting system and slight hydroureter on right   Neuropathy  NICM (nonischemic cardiomyopathy) ([place])    a. 02/2018 Cath: LM nl, LAD min irregs, LCX no, RCA 20d. [id number]. Fick CO/CI 4.4/2.0.   Osteoarthritis, multiple sites    Shoulders, back, knees   Pacemaker 02/05/2012   dual chamber, complete heart block, meddtronic revo, lasted checked 12/2015.  Since no CAD on cath 05/2016, cards recommends upgrade to CRT-D.   Permanent atrial fibrillation ([place])    DCCV 07/09/13-converted, lasted two days, then back into afib--needs lifetime anticoagulation (Xarelto as of 09/2014)   Prostate cancer screening 09/2017   done by urol annually (normal prostate exam documented + PSA 0.84 as of 10/01/17 urol f/u.  10/2018  urol f/u PSA 0.6, no prostate nodule.   Rectus diastasis    Right ankle sprain 08/2017   w/distal fibula avulsion fx noted on u/s but not plain film-(Dr. Hudnall).   Skin cancer of arm, left    "burned it off" (01/04/2017)   TIA (transient ischemic attack)    L face and L arm weakness. Peri procedural->a. 03/22/2018 following cath. CT head neg. No MRI b/c has pacer. Likely due to embolus to distal branch of RMCA   Type II diabetes mellitus ([place])     Past Surgical History:  Procedure Laterality Date   ABI's Bilateral 05/21/2018   normal   BACK SURGERY     BIV ICD INSERTION CRT-D N/A 01/04/2017   Procedure: BiV ICD ;  Surgeon: Constance Haw, MD;  Location: [place] [facility];  Service: Cardiovascular;  Laterality: N/A;   CARDIAC CATHETERIZATION N/A 06/14/2016   Minimal nonobstructive dz, EF 25-35%.  Procedure: Left Heart Cath and Coronary Angiography;  Surgeon: Peter M Martinique, MD;  Location: [place] [facility];  Service: Cardiovascular;  Laterality: N/A;   CARDIOVASCULAR STRESS TEST  2012   2012 nuclear perfusion study: low risk scan; 04/2016 normal myocardial perfusion imaging, EF 32%.   CARDIOVERSION  07/09/2012   Procedure: CARDIOVERSION;  Surgeon: [name], MD;  Location: [place] [facility];  Service: Cardiovascular;  Laterality: N/A;   CATARACT EXTRACTION W/ INTRAOCULAR LENS IMPLANT & ANTERIOR VITRECTOMY, BILATERAL Bilateral    CIRCUMCISION N/A 03/23/2020   Procedure: CIRCUMCISION ADULT;  Surgeon: Remi Haggard, MD;  Location: [facility];  Service: Urology;  Laterality: N/A;   COLONOSCOPY W/ POLYPECTOMY  approx 2006; repeated 09/2011   Polyps on 2013 EGD as well, repeat 12/2014   COLONOSCOPY WITH PROPOFOL N/A 07/08/2021   adenoma x 1. Procedure: COLONOSCOPY WITH PROPOFOL;  Surgeon: Carol Ada, MD;  Location: [facility];  Service: Endoscopy;  Laterality: N/A;   ESOPHAGOGASTRODUODENOSCOPY  10/18/2006   Done due to chronic GERD: Normal, bx showed no barrett's esophagus (Dr.  [name])   [place] Left 10/02/2016   Procedure: LEFT RING FINGER WOUND EXPORATION AND FLEXOR TENDON REPAIR AND NERVE REPAIR;  Surgeon: Milly Jakob, MD;  Location: [place];  Service: Orthopedics;  Laterality: Left;   INSERT / REPLACE / REMOVE PACEMAKER  02/05/2012   dual chamber, sinus node dysfunction, sinus arrest, PAF, Medtronic Revo serial#[id number] H: last checked 05/2015   LUMBAR LAMINECTOMY Left 1976   L4-5   PACEMAKER REMOVAL  01/04/2017   PERMANENT PACEMAKER INSERTION N/A 02/05/2012   Procedure: PERMANENT PACEMAKER INSERTION;  Surgeon: [name], MD; Generator Medtronic [place] model [place] serial number [id number] H Laterality: N/A;   POLYPECTOMY  07/08/2021   Procedure: POLYPECTOMY;  Surgeon: Carol Ada, MD;  Location: [facility];  Service: Endoscopy;;   RETINAL DETACHMENT SURGERY Left ~ [place] Left 2018  Left shoulder reverse [name] in [place]).   RIGHT/LEFT HEART CATH AND CORONARY ANGIOGRAPHY N/A 03/22/2018   EF 30-35%, no CAD.  Procedure: RIGHT/LEFT HEART CATH AND CORONARY ANGIOGRAPHY;  Surgeon: Jolaine Artist, MD;  Location: [place] [facility];  Service: Cardiovascular;  Laterality: N/A;   TRANSTHORACIC ECHOCARDIOGRAM  08/25/10; 05/2012; 03/23/16;12/2017   mild asymmetric LVH, normal systolic function, normal diastolic fxn, mild-to-mod mitral regurg, mild aortic valve sclerosis and trace AI, mild aortic root dilatation. 2014 f/u showed EF 40-45%, mod LAE, A FIB.  02/2016 EF 40%, diffuse hypokinesis, grade 2 DD. 12/2017 EF 35-40%,diffuse hypokin,grd III DD, mild MR   WISDOM TOOTH EXTRACTION  06/27/2021   EF 50-55%, mild aortic root/ascending aorta dilatation (41-42 mm).    Outpatient Medications Prior to Visit  Medication Sig Dispense Refill   BD INSULIN SYRINGE U/F 31G X 5/16" 1 ML MISC USE DAILY AS DIRECTED 100 each 3   COMBIGAN 0.2-0.5 % ophthalmic solution Place 1 drop into both eyes at bedtime.      Continuous  Blood Gluc Sensor (FREESTYLE LIBRE SENSOR SYSTEM) MISC USE TO CHECK BLOOD SUGARS 4 TIMES DAILY. Before breakfast, before lunch, before supper, and at bedtime 1 each 3   Dulaglutide (TRULICITY) 3 NF/6.2ZH SOPN Inject 3 mg into the skin once a week. 6 mL 3   empagliflozin (JARDIANCE) 10 MG TABS tablet Take 1 tablet (10 mg total) by mouth daily. 90 tablet 3   ENTRESTO 97-103 MG TAKE 1 TABLET TWO TIMES A DAY 180 tablet 3   eplerenone (INSPRA) 25 MG tablet TAKE 1 TABLET DAILY 90 tablet 3   gabapentin (NEURONTIN) 300 MG capsule TAKE 2 CAPSULES TWICE A DAY 360 capsule 1   insulin aspart (NOVOLOG FLEXPEN) 100 UNIT/ML FlexPen INJECT 6-8 UNITS UNDER THE SKIN AT THE TIME OF EACH MEAL (Patient taking differently: Pt stated 20 units in the a.m only   UNDER THE SKIN AT THE TIME OF EACH MEAL) 15 mL 4   insulin glargine (LANTUS) 100 UNIT/ML injection Inject 0.4-0.45 mLs (40-45 Units total) into the skin daily. 10 mL 0   latanoprost (XALATAN) 0.005 % ophthalmic solution Place 1 drop into both eyes at bedtime.   12   lidocaine (LIDODERM) 5 % PLACE 1 PATCH ON THE SKIN DAILY. REMOVE AND DISCARD PATCH WITHIN 12 HOURS OR AS DIRECTED BY DOCTOR 90 patch 1   meclizine (ANTIVERT) 25 MG tablet TAKE 1 TABLET THREE TIMES A DAY AS NEEDED FOR DIZZINESS. 270 tablet 3   metoprolol succinate (TOPROL-XL) 50 MG 24 hr tablet TAKE 1 TABLET DAILY. TAKE WITH OR IMMEDIATELY FOLLOWING A MEAL Strength: 50 mg 90 tablet 1   omeprazole (PRILOSEC) 40 MG capsule Take 1 capsule (40 mg total) by mouth daily. 90 capsule 1   oxybutynin (DITROPAN XL) 10 MG 24 hr tablet Take 1 tablet (10 mg total) by mouth at bedtime. 90 tablet 1   rivaroxaban (XARELTO) 20 MG TABS tablet Take 1 tablet (20 mg total) by mouth daily with supper. 90 tablet 1   rOPINIRole (REQUIP) 1 MG tablet Take 1 tablet (1 mg total) by mouth daily. 90 tablet 3   rosuvastatin (CRESTOR) 20 MG tablet Take 1 tablet (20 mg total) by mouth daily. 90 tablet 1   sertraline (ZOLOFT) 100 MG tablet  Take 1 tablet (100 mg total) by mouth daily. 90 tablet 3   SURE COMFORT PEN NEEDLES 31G X 5 MM MISC USE TO INJECT INSULIN UNDER THE SKIN 100 each 11  acetaminophen (TYLENOL) 325 MG tablet Take 650 mg by mouth every 6 (six) hours as needed for moderate pain. (Patient not taking: Reported on 02/13/2022)     diclofenac Sodium (VOLTAREN) 1 % GEL Apply 4 g topically 4 (four) times daily. (Patient not taking: Reported on 02/13/2022) 100 g 1   furosemide (LASIX) 20 MG tablet Take 1 tablet (20 mg total) by mouth daily as needed for edema (For a weight gain of 3 pounds overnight or 5 pounds in one week). (Patient not taking: Reported on 11/07/2021) 30 tablet 5   ketorolac (ACULAR) 0.5 % ophthalmic solution      No facility-administered medications prior to visit.    No Known Allergies  ROS As per HPI  PE:    05/10/2022    1:14 PM 02/13/2022    9:23 AM 01/26/2022    1:17 PM  Vitals with BMI  Height '5\' 11"'$  '5\' 11"'$  '5\' 11"'$   Weight 231 lbs 10 oz 232 lbs 6 oz 237 lbs 3 oz  BMI 32.32 81.27 51.7  Systolic 001 96 749  Diastolic 69 72 82  Pulse 79 75 98     Physical Exam  Gen: Alert, well appearing.  Patient is oriented to person, place, time, and situation. AFFECT: pleasant, lucid thought and speech. Lungs are clear to auscultation in all lung fields, good aeration, nonlabored breathing. Cardiovascular: Regular rhythm and rate without murmur. He has focal tenderness to palpation in about a fist sized area on the posterior chest wall, right side, over the bottom 2 ribs or so.  No bruising or erythema. Extremities: No edema.  LABS:  Last metabolic panel Lab Results  Component Value Date   GLUCOSE 150 (H) 12/06/2021   NA 139 12/06/2021   K 4.4 12/06/2021   CL 101 12/06/2021   CO2 30 12/06/2021   BUN 20 12/06/2021   CREATININE 1.58 (H) 12/06/2021   GFRNONAA 37 (L) 03/23/2020   CALCIUM 9.2 12/06/2021   PROT 6.9 12/06/2021   ALBUMIN 4.1 12/06/2021   BILITOT 0.5 12/06/2021   ALKPHOS 72  12/06/2021   AST 18 12/06/2021   ALT 13 12/06/2021   ANIONGAP 9 03/23/2020   IMPRESSION AND PLAN:  #1 posterior chest wall pain. Suspect musculoskeletal/intercostal sprain or strain secondary to excessive coughing lately.. Rule out rib fracture--x-ray ordered today. POC dipstick UA today: 3+ glucose, trace protein, otherwise NORMAL  #2 cough.  Lung exam normal.  No sign of acute CHF. At this point no further work-up is needed.  An After Visit Summary was printed and given to the patient.  FOLLOW UP: No follow-ups on file.  Signed:  Crissie Sickles, MD           05/10/2022

## 2022-05-10 NOTE — Patient Instructions (Signed)
Apply a heating pad or moist warm towel over your area of pain for 15 minutes twice a day. Do range of motion exercises of your back.

## 2022-05-11 ENCOUNTER — Ambulatory Visit (HOSPITAL_BASED_OUTPATIENT_CLINIC_OR_DEPARTMENT_OTHER)
Admission: RE | Admit: 2022-05-11 | Discharge: 2022-05-11 | Disposition: A | Discharge status: Home / Self Care | Payer: Medicare Other | Source: Ambulatory Visit | Attending: Family Medicine | Admitting: Family Medicine

## 2022-05-11 ENCOUNTER — Telehealth: Payer: Self-pay

## 2022-05-11 DIAGNOSIS — R0789 Other chest pain: Secondary | ICD-10-CM | POA: Insufficient documentation

## 2022-05-11 NOTE — Telephone Encounter (Addendum)
Pt called to advise he has run out of [place] sensors and can not remember where his are ordered from. He states they come in the mail he thinks from [place]. He is not sure. He states he can stick his finger until his appt next month and we can figure it out at that time.

## 2022-05-12 ENCOUNTER — Other Ambulatory Visit: Payer: Self-pay | Admitting: Family Medicine

## 2022-05-12 NOTE — Telephone Encounter (Signed)
Pt called back to advise he received his sensors today.

## 2022-05-22 DIAGNOSIS — R194 Change in bowel habit: Secondary | ICD-10-CM | POA: Diagnosis not present

## 2022-05-22 DIAGNOSIS — K219 Gastro-esophageal reflux disease without esophagitis: Secondary | ICD-10-CM | POA: Diagnosis not present

## 2022-05-22 DIAGNOSIS — R1013 Epigastric pain: Secondary | ICD-10-CM | POA: Diagnosis not present

## 2022-05-24 DIAGNOSIS — H401123 Primary open-angle glaucoma, left eye, severe stage: Secondary | ICD-10-CM | POA: Diagnosis not present

## 2022-05-24 DIAGNOSIS — H1131 Conjunctival hemorrhage, right eye: Secondary | ICD-10-CM | POA: Diagnosis not present

## 2022-05-25 ENCOUNTER — Encounter: Payer: Self-pay | Admitting: Family Medicine

## 2022-05-25 ENCOUNTER — Ambulatory Visit (INDEPENDENT_AMBULATORY_CARE_PROVIDER_SITE_OTHER): Payer: Medicare Other | Admitting: Family Medicine

## 2022-05-25 VITALS — BP 111/75 | HR 80 | Temp 97.5°F | Wt 231.0 lb

## 2022-05-25 DIAGNOSIS — R918 Other nonspecific abnormal finding of lung field: Secondary | ICD-10-CM

## 2022-05-25 DIAGNOSIS — R0781 Pleurodynia: Secondary | ICD-10-CM

## 2022-05-25 DIAGNOSIS — R0789 Other chest pain: Secondary | ICD-10-CM

## 2022-05-25 DIAGNOSIS — M7918 Myalgia, other site: Secondary | ICD-10-CM

## 2022-05-25 DIAGNOSIS — R053 Chronic cough: Secondary | ICD-10-CM | POA: Diagnosis not present

## 2022-05-25 MED ORDER — DOXYCYCLINE HYCLATE 100 MG PO CAPS: 100.0000 mg | ORAL_CAPSULE | Freq: Two times a day (BID) | ORAL | 0 refills | Status: DC

## 2022-05-25 NOTE — Progress Notes (Signed)
OFFICE VISIT  05/25/2022  CC:  Chief Complaint  Patient presents with   Flank Pain    Right side   Patient is a 81 y.o. male who presents for continued pain on the right side. I last saw him 2 wks ago. A/P as of last visit: " posterior chest wall pain. Suspect musculoskeletal/intercostal sprain or strain secondary to excessive coughing lately.. Rule out rib fracture--x-ray ordered today. POC dipstick UA today: 3+ glucose, trace protein, otherwise NORMAL   #2 cough.  Lung exam normal.  No sign of acute CHF. At this point no further work-up is needed."  INTERIM HX: No change in his pain. Still hurts worse with cough, which she says he has had a significant amount of over the last 9 to 12 months.  No change in his baseline level of dyspnea on exertion.  The cough is intermittently productive of a lot of mucus.  No blood in sputum. No fevers. Back when he started having the posterolateral right chest wall pain back in April this year his initial ribs x-ray showed a mild deformity in the posterior aspect of the right 10th, 11th, and 12th ribs without break in the cortical margins, possibly old healed fractures. Follow-up CT of chest without contrast was done a couple weeks later when he had not improved.  This did not show any acute or chronic/old rib abnormalities. It did show minimal groundglass and peripheral reticular opacities in the right lung base, nonspecific.  Findings may be infectious/inflammatory. CT also showed an ascending aortic aneurysm, for which he was referred to cardiothoracic surgery. CT chest without contrast scheduled for 06/21/2022-->Dr. Bartle.  ROS: No fevers, no chills, no fatigue, no weight loss or weight gain.  No orthopnea or PND.  No lower extremity edema.  No chest pain.  No right flank or abdominal pain or groin pain.  No blood in urine.  Past Medical History:  Diagnosis Date   AICD (automatic cardioverter/defibrillator) present 2018   MDT CRT-D.   Fatigue-->completely pacer dependent.  Pacer settings adjusted 12/2017 to allow more chronotrophc variance with ADL's//exertion.   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   02/2016 EF 40%, diffuse hypokinesis, grade 2 DD.  Myoc perf imaging showed EF 32% 04/2016.  Pt upgraded to CRT-D 01/04/17.   Chronic renal insufficiency, stage III (moderate) (HCC) 2015   CrCl about 60 ml/min   Complete heart block (HCC)    Has dual chamber pacer.   COVID-19 virus infection 01/05/2021   paxlovid   Depression    DOE (dyspnea on exertion)    NYHA class II/III CHF   Dyspnea 2021   with exertion, bending over   Episodic low back pain 01/22/2013   w/intermittent radiculitis (12/2014 his neurologist referred him to pain mgmt for epidural steroid injection)   Erectile dysfunction 2019   due to zoloft--urol rx'd viagra   GERD (gastroesophageal reflux disease)    H/O tilt table evaluation 54/62/7035   negative   Helicobacter pylori gastritis 01/2016   History of adenomatous polyp of colon 10/12/2011   Dr. [name] (3 right side of colon- tubular adenomas removed)   History of cardiovascular stress test 05/28/2012   no ischemia, EF 37%, imaging results are unchanged and  within normal variance   History of chronic prostatitis    History of kidney stones    History of vertigo    + Hx of posterior HA's.  Neuro (Dr. Erling Cruz) eval 2011.  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   Hyperlipidemia    Hypertension    Lumbar spondylosis    lumbosacral radiculopathy at L4 by EMG testing, right foot drop (neurologist is Dr. Linus Salmons with [facility] in [place])--neurologist  referred him to neurosurgery   Migraine    "used to have them all the time; none for years" (01/04/2017)   Myocardial infarction [facility]) ?1970s   not entirely certain of this   Nephrolithiasis 07/2012   Left UVJ 2 mm stone with dilation of renal collecting system and slight hydroureter on right   Neuropathy    NICM (nonischemic cardiomyopathy) ([place])    a. 02/2018 Cath: LM nl, LAD min irregs, LCX no, RCA 20d. [id number]. Fick CO/CI 4.4/2.0.   Osteoarthritis, multiple sites    Shoulders, back, knees   Pacemaker 02/05/2012   dual chamber, complete heart block, meddtronic revo, lasted checked 12/2015.  Since no CAD on cath 05/2016, cards recommends upgrade to CRT-D.   Permanent atrial fibrillation ([place])    DCCV 07/09/13-converted, lasted two days, then back into afib--needs lifetime anticoagulation (Xarelto as of 09/2014)   Prostate cancer screening 09/2017   done by urol annually (normal prostate exam documented + PSA 0.84 as of 10/01/17 urol f/u.  10/2018 urol f/u PSA 0.6, no prostate nodule.   Rectus diastasis    Right ankle sprain 08/2017   w/distal fibula avulsion fx noted on u/s but not plain film-(Dr. Hudnall).   Skin cancer of arm, left    "burned it off" (01/04/2017)   TIA (transient ischemic attack)    L face and L arm weakness. Peri procedural->a. 03/22/2018 following cath. CT head neg. No MRI b/c has pacer. Likely due to embolus to distal branch of RMCA   Type II diabetes mellitus ([place])     Past Surgical History:  Procedure Laterality Date   ABI's Bilateral 05/21/2018   normal   BACK SURGERY     BIV ICD INSERTION CRT-D N/A 01/04/2017   Procedure: BiV ICD ;  Surgeon: Constance Haw, MD;  Location: [facility];  Service: Cardiovascular;  Laterality: N/A;   CARDIAC CATHETERIZATION N/A 06/14/2016   Minimal nonobstructive dz, EF 25-35%.  Procedure: Left Heart Cath and Coronary Angiography;  Surgeon: Peter M Martinique, MD;  Location: [facility];  Service: Cardiovascular;   Laterality: N/A;   CARDIOVASCULAR STRESS TEST  2012   2012 nuclear perfusion study: low risk scan; 04/2016 normal myocardial perfusion imaging, EF 32%.   CARDIOVERSION  07/09/2012   Procedure: CARDIOVERSION;  Surgeon: [name], MD;  Location: [facility];  Service: Cardiovascular;  Laterality: N/A;   CATARACT EXTRACTION W/ INTRAOCULAR LENS IMPLANT & ANTERIOR VITRECTOMY, BILATERAL Bilateral    CIRCUMCISION N/A 03/23/2020   Procedure: CIRCUMCISION ADULT;  Surgeon: Remi Haggard, MD;  Location: [facility];  Service: Urology;  Laterality: N/A;   COLONOSCOPY W/ POLYPECTOMY  approx 2006; repeated 09/2011   Polyps on 2013 EGD as well, repeat 12/2014   COLONOSCOPY WITH PROPOFOL N/A 07/08/2021   adenoma x 1. Procedure: COLONOSCOPY WITH PROPOFOL;  Surgeon: Carol Ada, MD;  Location: [facility];  Service: Endoscopy;  Laterality: N/A;   ESOPHAGOGASTRODUODENOSCOPY  10/18/2006   Done due to chronic GERD: Normal, bx  showed no barrett's esophagus (Dr. [name])   [place] Left 10/02/2016   Procedure: LEFT RING FINGER WOUND EXPORATION AND FLEXOR TENDON REPAIR AND NERVE REPAIR;  Surgeon: Milly Jakob, MD;  Location: [facility];  Service: Orthopedics;  Laterality: Left;   INSERT / REPLACE / REMOVE PACEMAKER  02/05/2012   dual chamber, sinus node dysfunction, sinus arrest, PAF, Medtronic Revo serial#[id number] H: last checked 05/2015   LUMBAR LAMINECTOMY Left 1976   L4-5   PACEMAKER REMOVAL  01/04/2017   PERMANENT PACEMAKER INSERTION N/A 02/05/2012   Procedure: PERMANENT PACEMAKER INSERTION;  Surgeon: [name], MD; Generator Medtronic [place] model [place] serial number [id number] H Laterality: N/A;   POLYPECTOMY  07/08/2021   Procedure: POLYPECTOMY;  Surgeon: Carol Ada, MD;  Location: [facility];  Service: Endoscopy;;   RETINAL DETACHMENT SURGERY Left ~ [place] Left 2018   Left shoulder reverse [name] in [place]).   RIGHT/LEFT HEART CATH AND  CORONARY ANGIOGRAPHY N/A 03/22/2018   EF 30-35%, no CAD.  Procedure: RIGHT/LEFT HEART CATH AND CORONARY ANGIOGRAPHY;  Surgeon: Jolaine Artist, MD;  Location: [facility];  Service: Cardiovascular;  Laterality: N/A;   TRANSTHORACIC ECHOCARDIOGRAM  08/25/10; 05/2012; 03/23/16;12/2017   mild asymmetric LVH, normal systolic function, normal diastolic fxn, mild-to-mod mitral regurg, mild aortic valve sclerosis and trace AI, mild aortic root dilatation. 2014 f/u showed EF 40-45%, mod LAE, A FIB.  02/2016 EF 40%, diffuse hypokinesis, grade 2 DD. 12/2017 EF 35-40%,diffuse hypokin,grd III DD, mild MR   WISDOM TOOTH EXTRACTION  06/27/2021   EF 50-55%, mild aortic root/ascending aorta dilatation (41-42 mm).    Outpatient Medications Prior to Visit  Medication Sig Dispense Refill   BD INSULIN SYRINGE U/F 31G X 5/16" 1 ML MISC USE DAILY AS DIRECTED 100 each 3   COMBIGAN 0.2-0.5 % ophthalmic solution Place 1 drop into both eyes at bedtime.      Continuous Blood Gluc Sensor (FREESTYLE LIBRE SENSOR SYSTEM) MISC USE TO CHECK BLOOD SUGARS 4 TIMES DAILY. Before breakfast, before lunch, before supper, and at bedtime 1 each 3   Dulaglutide (TRULICITY) 3 GO/1.1XB SOPN Inject 3 mg into the skin once a week. 6 mL 3   empagliflozin (JARDIANCE) 10 MG TABS tablet Take 1 tablet (10 mg total) by mouth daily. 90 tablet 3   ENTRESTO 97-103 MG TAKE 1 TABLET TWO TIMES A DAY 180 tablet 3   eplerenone (INSPRA) 25 MG tablet TAKE 1 TABLET DAILY 90 tablet 3   gabapentin (NEURONTIN) 300 MG capsule TAKE 2 CAPSULES TWICE A DAY 360 capsule 1   insulin aspart (NOVOLOG FLEXPEN) 100 UNIT/ML FlexPen INJECT 6-8 UNITS UNDER THE SKIN AT THE TIME OF EACH MEAL (Patient taking differently: Pt stated 20 units in the a.m only   UNDER THE SKIN AT THE TIME OF EACH MEAL) 15 mL 4   insulin glargine (LANTUS) 100 UNIT/ML injection Inject 0.4-0.45 mLs (40-45 Units total) into the skin daily. 10 mL 0   ketorolac (ACULAR) 0.5 % ophthalmic solution       latanoprost (XALATAN) 0.005 % ophthalmic solution Place 1 drop into both eyes at bedtime.   12   lidocaine (LIDODERM) 5 % PLACE 1 PATCH ON THE SKIN DAILY. REMOVE AND DISCARD PATCH WITHIN 12 HOURS OR AS DIRECTED BY DOCTOR 90 patch 1   meclizine (ANTIVERT) 25 MG tablet TAKE 1 TABLET THREE TIMES A DAY AS NEEDED FOR DIZZINESS. 270 tablet 3   metoprolol succinate (TOPROL-XL) 50 MG 24 hr  tablet TAKE 1 TABLET DAILY. TAKE WITH OR IMMEDIATELY FOLLOWING A MEAL Strength: 50 mg 90 tablet 1   omeprazole (PRILOSEC) 40 MG capsule Take 1 capsule (40 mg total) by mouth daily. 90 capsule 1   oxybutynin (DITROPAN XL) 10 MG 24 hr tablet Take 1 tablet (10 mg total) by mouth at bedtime. 90 tablet 1   rivaroxaban (XARELTO) 20 MG TABS tablet Take 1 tablet (20 mg total) by mouth daily with supper. 90 tablet 1   rOPINIRole (REQUIP) 1 MG tablet Take 1 tablet (1 mg total) by mouth daily. 90 tablet 3   rosuvastatin (CRESTOR) 20 MG tablet Take 1 tablet (20 mg total) by mouth daily. 90 tablet 1   sertraline (ZOLOFT) 100 MG tablet Take 1 tablet (100 mg total) by mouth daily. 90 tablet 3   SURE COMFORT PEN NEEDLES 31G X 5 MM MISC USE TO INJECT INSULIN UNDER THE SKIN 100 each 11   acetaminophen (TYLENOL) 325 MG tablet Take 650 mg by mouth every 6 (six) hours as needed for moderate pain. (Patient not taking: Reported on 02/13/2022)     diclofenac Sodium (VOLTAREN) 1 % GEL Apply 4 g topically 4 (four) times daily. (Patient not taking: Reported on 02/13/2022) 100 g 1   furosemide (LASIX) 20 MG tablet Take 1 tablet (20 mg total) by mouth daily as needed for edema (For a weight gain of 3 pounds overnight or 5 pounds in one week). (Patient not taking: Reported on 11/07/2021) 30 tablet 5   No facility-administered medications prior to visit.    No Known Allergies  ROS As per HPI  PE:    05/25/2022    1:50 PM 05/10/2022    1:14 PM 02/13/2022    9:23 AM  Vitals with BMI  Height  '5\' 11"'$  '5\' 11"'$   Weight 231 lbs 231 lbs 10 oz 232 lbs  6 oz  BMI 32.23 70.62 37.62  Systolic 831 517 96  Diastolic 75 69 72  Pulse 80 79 75     Physical Exam  Gen: Alert, well appearing.  Patient is oriented to person, place, time, and situation. AFFECT: pleasant, lucid thought and speech. LUNGS: CTA bilat, nonlabored Back: Focal TTP in a tennis ball sized area at inferior most rib about 15 cm to the right of facet region right side. No rash.  No palpable nodule/deformity.  LABS:  Last metabolic panel Lab Results  Component Value Date   GLUCOSE 150 (H) 12/06/2021   NA 139 12/06/2021   K 4.4 12/06/2021   CL 101 12/06/2021   CO2 30 12/06/2021   BUN 20 12/06/2021   CREATININE 1.58 (H) 12/06/2021   GFRNONAA 37 (L) 03/23/2020   CALCIUM 9.2 12/06/2021   PROT 6.9 12/06/2021   ALBUMIN 4.1 12/06/2021   BILITOT 0.5 12/06/2021   ALKPHOS 72 12/06/2021   AST 18 12/06/2021   ALT 13 12/06/2021   ANIONGAP 9 03/23/2020    IMPRESSION AND PLAN:  #1 right posterolateral chest wall pain.  Exam favors myofascial pain.  Imaging work-up has not revealed obvious findings to explain his pain.  However, given the right lung base nonspecific findings on his CT 6 months ago I will go ahead and treat with a 2-week course of doxycycline. Hopefully this will improve his cough and then improving the cough will allow his chest wall pain to begin to subside. If worsening prior to follow-up in 2 weeks then will check bone scan. May also consider diagnostic/therapeutic trigger point injection.  An After  Visit Summary was printed and given to the patient.  FOLLOW UP: Return in about 2 weeks (around 06/08/2022) for f/u R side/back pain and cough.  Signed:  Crissie Sickles, MD           05/25/2022

## 2022-05-30 ENCOUNTER — Encounter: Payer: Self-pay | Admitting: Family Medicine

## 2022-05-31 ENCOUNTER — Ambulatory Visit (INDEPENDENT_AMBULATORY_CARE_PROVIDER_SITE_OTHER): Payer: Medicare Other | Admitting: Internal Medicine

## 2022-05-31 ENCOUNTER — Other Ambulatory Visit: Payer: Self-pay | Admitting: Family Medicine

## 2022-05-31 ENCOUNTER — Encounter: Payer: Self-pay | Admitting: Internal Medicine

## 2022-05-31 VITALS — BP 120/64 | HR 95 | Ht 71.0 in | Wt 233.8 lb

## 2022-05-31 DIAGNOSIS — E785 Hyperlipidemia, unspecified: Secondary | ICD-10-CM

## 2022-05-31 DIAGNOSIS — E1159 Type 2 diabetes mellitus with other circulatory complications: Secondary | ICD-10-CM

## 2022-05-31 DIAGNOSIS — E1165 Type 2 diabetes mellitus with hyperglycemia: Secondary | ICD-10-CM | POA: Diagnosis not present

## 2022-05-31 LAB — POCT GLYCOSYLATED HEMOGLOBIN (HGB A1C): Hemoglobin A1C: 6.6 % — AB (ref 4.0–5.6)

## 2022-05-31 MED ORDER — FREESTYLE LIBRE 2 READER DEVI: 1.0000 | Freq: Every day | 3 refills | Status: DC

## 2022-05-31 MED ORDER — FREESTYLE LIBRE 2 SENSOR MISC: 1.0000 | 3 refills | Status: DC

## 2022-05-31 NOTE — Patient Instructions (Addendum)
Please continue: - Jardiance 10 mg before breakfast - Trulicity 3 mg weekly  Please decrease: - Lantus 42 units at bedtime - NovoLog  10 units before brunch 7-8 units before a larger dinner If you have a snack at night, you may need ~5 units before the snack  NO EATING AFTER DARK.  Please return in 4 months.

## 2022-05-31 NOTE — Progress Notes (Signed)
Patient ID: Mason [name], male   DOB: 1941/01/22, 81 y.o.   MRN: [id number]   HPI: Mason [name] is a 81 y.o.-year-old male, initially referred by his PCP, Dr. Anitra Lauth, returning for follow-up for DM2, dx in ~2011, insulin-dependent since 2019, uncontrolled, with long-term complications (CAD with ?h/o AMI, nonischemic CMP, CHF, Afib, cerebrovascular disease with h/o TIA, CKD stage III, peripheral neuropathy, ED).  Last visit 4 months ago.  Interim history: No increased urination, nausea, chest pain.  He does have blurry vision.  He sees Dr. Zadie Rhine.  He had an eye exam 09/23. He also has back pain.    Reviewed HbA1c levels:  Lab Results  Component Value Date   HGBA1C 7.3 (A) 01/26/2022   HGBA1C 7.2 (A) 09/08/2021   HGBA1C 6.7 (A) 12/09/2020   HGBA1C 7.6 (A) 09/09/2020   HGBA1C 9.7 (H) 06/22/2020   HGBA1C 9.0 (H) 03/23/2020   HGBA1C 9.2 (H) 03/16/2020   HGBA1C 9.9 (H) 11/20/2019   HGBA1C 9.7 (H) 05/27/2019   HGBA1C 9.0 (H) 02/26/2019   HGBA1C 10.1 (H) 11/29/2018   HGBA1C 9.0 (A) 08/29/2018   HGBA1C 9.0 08/29/2018   HGBA1C 9.0 (A) 08/29/2018   HGBA1C 9.0 (A) 08/29/2018   HGBA1C 7.8 (A) 05/14/2018   HGBA1C 8.1 (H) 02/06/2018   HGBA1C 8.0 (H) 10/26/2017   HGBA1C 8.2 (H) 07/27/2017   HGBA1C 7.0 03/01/2017   HGBA1C 7.1 11/16/2016   HGBA1C 7.8 (H) 07/03/2016   HGBA1C 6.8 03/14/2016   HGBA1C 7.0 (H) 12/09/2015   HGBA1C 7.4 (H) 09/20/2015   HGBA1C 7.2 (H) 03/25/2015   HGBA1C 7.5 (H) 10/15/2014   HGBA1C 7.1 (H) 06/15/2014   HGBA1C 6.9 (H) 02/12/2014   HGBA1C 8.6 (H) 10/14/2013  04/11/2021: HbA1c 7.1%  He is on: - Jardiance 10 mg before breakfast - Trulicity 1.5 >> 3  mg weekly - NovoLog pens :  14-18 >> 18-20 >> 15-16 >> 15 units before brunch 8-10 units before dinner >> 0 units >> 7-8 units before a larger dinner If you have a snack at night, you may need ~5 units before the snack - Lantus vial 44 >> 40 >> 36-40 >> 42 >> 45 units at bedtime She was on metformin in the  past.  Pt checks his sugars more than 4 times a day with his freestyle libre CGM:  Previously:   Previously:   Lowest sugar was: 50s >> 48 >> 50s >> 44; he has hypoglycemia awareness at 70.  Highest sugar was 400 >> ... 260 >> 250 >> 200s  Glucometer: Freestyle  Pt's meals are: - Breakfast: cereals, eggs, grits, toast - Lunch: may skip or sandwich - Dinner: meat + veggies + starch - Snacks: apple/cheese >> bananasplits at night Patient saw nutrition a long time ago.  -+ Stage III CKD, last BUN/creatinine:  Lab Results  Component Value Date   BUN 20 12/06/2021   BUN 25 (H) 08/09/2021   CREATININE 1.58 (H) 12/06/2021   CREATININE 1.55 (H) 08/09/2021  04/11/2021: 31/1.86, GFR 37, glucose 133 On Entresto.  -+ HL; last set of lipids: Lab Results  Component Value Date   CHOL 119 12/06/2021   HDL 36.00 (L) 12/06/2021   LDLCALC 46 12/06/2021   LDLDIRECT 103.0 06/22/2020   TRIG 182.0 (H) 12/06/2021   CHOLHDL 3 12/06/2021  04/11/2021: 141/214/45/53 Previously on Zocor 20, but now Crestor 20 mg daily.  - last eye exam was in 03/2022: No DR, + glaucoma.  - He has numbness and tingling in his  feet.  On Neurontin 300 mg twice a day.  Up-to-date with foot exam: last visit with podiatry 08/15/2021  -annual foot exam - in 09/2021.  No known FH of DM.  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 Vitamin B12 level: 04/11/2021: 429  Drinks beer 2-3x a day, but not quite every day.  ROS: + see HPI  I reviewed pt's medications, allergies, PMH, social hx, family hx, and changes were documented in the history of present illness. Otherwise, unchanged from my initial visit note.  Past Medical History:  Diagnosis Date   AICD (automatic cardioverter/defibrillator) present 2018   MDT CRT-D.  Fatigue-->completely pacer dependent.  Pacer settings adjusted 12/2017 to allow more chronotrophc variance  with ADL's//exertion.   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   02/2016 EF 40%, diffuse hypokinesis, grade 2 DD.  Myoc perf imaging showed EF 32% 04/2016.  Pt upgraded to CRT-D 01/04/17.   Chronic renal insufficiency, stage III (moderate) (HCC) 2015   CrCl about 60 ml/min   Complete heart block (HCC)    Has dual chamber pacer.   COVID-19 virus infection 01/05/2021   paxlovid   Depression    DOE (dyspnea on exertion)    NYHA class II/III CHF   Dyspnea 2021   with exertion, bending over   Episodic low back pain 01/22/2013   w/intermittent radiculitis (12/2014 his neurologist referred him to pain mgmt for epidural steroid injection)   Erectile dysfunction 2019   due to zoloft--urol rx'd viagra   GERD (gastroesophageal reflux disease)    H/O tilt table evaluation 53/97/6734   negative   Helicobacter pylori gastritis 01/2016   History of adenomatous polyp of colon 10/12/2011   Dr. [name] (3 right side of colon- tubular adenomas removed)   History of cardiovascular stress test 05/28/2012   no ischemia, EF 37%, imaging results are unchanged and within normal variance   History of chronic prostatitis    History of kidney stones    History of vertigo    + Hx of posterior HA's.  Neuro (Dr. Erling Cruz) eval 2011.  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   Hyperlipidemia    Hypertension    Lumbar spondylosis    lumbosacral radiculopathy at L4 by EMG testing, right foot drop (neurologist is Dr. Linus Salmons with [facility] in [place])--neurologist referred him to neurosurgery   Migraine    "used to have them all the time; none for years" (01/04/2017)    Myocardial infarction [facility]) ?1970s   not entirely certain of this   Nephrolithiasis 07/2012   Left UVJ 2 mm stone with dilation of renal collecting system and slight hydroureter on right   Neuropathy    NICM (nonischemic cardiomyopathy) ([place])    a. 02/2018 Cath: LM nl, LAD min irregs, LCX no, RCA 20d. LPFX90. Fick CO/CI 4.4/2.0.   Osteoarthritis, multiple sites    Shoulders, back, knees   Pacemaker 02/05/2012   dual chamber, complete heart block, meddtronic revo, lasted checked 12/2015.  Since no CAD on cath 05/2016, cards recommends upgrade to CRT-D.   Permanent atrial fibrillation (HCC)    DCCV  07/09/13-converted, lasted two days, then back into afib--needs lifetime anticoagulation (Xarelto as of 09/2014)   Prostate cancer screening 09/2017   done by urol annually (normal prostate exam documented + PSA 0.84 as of 10/01/17 urol f/u.  10/2018 urol f/u PSA 0.6, no prostate nodule.   Rectus diastasis    Right ankle sprain 08/2017   w/distal fibula avulsion fx noted on u/s but not plain film-(Dr. Hudnall).   Skin cancer of arm, left    "burned it off" (01/04/2017)   TIA (transient ischemic attack)    L face and L arm weakness. Peri procedural->a. 03/22/2018 following cath. CT head neg. No MRI b/c has pacer. Likely due to embolus to distal branch of RMCA   Type II diabetes mellitus ([place])    Past Surgical History:  Procedure Laterality Date   ABI's Bilateral 05/21/2018   normal   BACK SURGERY     BIV ICD INSERTION CRT-D N/A 01/04/2017   Procedure: BiV ICD ;  Surgeon: Constance Haw, MD;  Location: [place] [facility];  Service: Cardiovascular;  Laterality: N/A;   CARDIAC CATHETERIZATION N/A 06/14/2016   Minimal nonobstructive dz, EF 25-35%.  Procedure: Left Heart Cath and Coronary Angiography;  Surgeon: Peter M Martinique, MD;  Location: [place] [facility];  Service: Cardiovascular;  Laterality: N/A;   CARDIOVASCULAR STRESS TEST  2012   2012 nuclear perfusion study: low risk scan; 04/2016  normal myocardial perfusion imaging, EF 32%.   CARDIOVERSION  07/09/2012   Procedure: CARDIOVERSION;  Surgeon: [name], MD;  Location: [place];  Service: Cardiovascular;  Laterality: N/A;   CATARACT EXTRACTION W/ INTRAOCULAR LENS IMPLANT & ANTERIOR VITRECTOMY, BILATERAL Bilateral    CIRCUMCISION N/A 03/23/2020   Procedure: CIRCUMCISION ADULT;  Surgeon: Remi Haggard, MD;  Location: [facility];  Service: Urology;  Laterality: N/A;   COLONOSCOPY W/ POLYPECTOMY  approx 2006; repeated 09/2011   Polyps on 2013 EGD as well, repeat 12/2014   COLONOSCOPY WITH PROPOFOL N/A 07/08/2021   adenoma x 1. Procedure: COLONOSCOPY WITH PROPOFOL;  Surgeon: Carol Ada, MD;  Location: [facility];  Service: Endoscopy;  Laterality: N/A;   ESOPHAGOGASTRODUODENOSCOPY  10/18/2006   Done due to chronic GERD: Normal, bx showed no barrett's esophagus (Dr. [name])   [place] Left 10/02/2016   Procedure: LEFT RING FINGER WOUND EXPORATION AND FLEXOR TENDON REPAIR AND NERVE REPAIR;  Surgeon: Milly Jakob, MD;  Location: [place];  Service: Orthopedics;  Laterality: Left;   INSERT / REPLACE / REMOVE PACEMAKER  02/05/2012   dual chamber, sinus node dysfunction, sinus arrest, PAF, Medtronic Revo serial#-[id number] H: last checked 05/2015   LUMBAR LAMINECTOMY Left 1976   L4-5   PACEMAKER REMOVAL  01/04/2017   PERMANENT PACEMAKER INSERTION N/A 02/05/2012   Procedure: PERMANENT PACEMAKER INSERTION;  Surgeon: [name], MD; Generator Medtronic [place] model [place] serial number [id number] H Laterality: N/A;   POLYPECTOMY  07/08/2021   Procedure: POLYPECTOMY;  Surgeon: Carol Ada, MD;  Location: [facility];  Service: Endoscopy;;   RETINAL DETACHMENT SURGERY Left ~ [place] Left 2018   Left shoulder reverse [name] in [place]).   RIGHT/LEFT HEART CATH AND CORONARY ANGIOGRAPHY N/A 03/22/2018   EF 30-35%, no CAD.  Procedure: RIGHT/LEFT HEART CATH AND CORONARY  ANGIOGRAPHY;  Surgeon: Jolaine Artist, MD;  Location: [place] [facility];  Service: Cardiovascular;  Laterality: N/A;   TRANSTHORACIC ECHOCARDIOGRAM  08/25/10; 05/2012; 03/23/16;12/2017   mild asymmetric LVH, normal systolic function, normal diastolic fxn, mild-to-mod  mitral regurg, mild aortic valve sclerosis and trace AI, mild aortic root dilatation. 2014 f/u showed EF 40-45%, mod LAE, A FIB.  02/2016 EF 40%, diffuse hypokinesis, grade 2 DD. 12/2017 EF 35-40%,diffuse hypokin,grd III DD, mild MR   WISDOM TOOTH EXTRACTION  06/27/2021   EF 50-55%, mild aortic root/ascending aorta dilatation (41-42 mm).   Social History   Socioeconomic History   Marital status: Married    Spouse name: Not on file   Number of children: Not on file   Years of education: Not on file   Highest education level: 12th grade  Occupational History   Not on file  Tobacco Use   Smoking status: Never   Smokeless tobacco: Never  Vaping Use   Vaping Use: Never used  Substance and Sexual Activity   Alcohol use: Yes    Comment: occ   Drug use: No   Sexual activity: Not on file  Other Topics Concern   Not on file  Social History Narrative   Not on file   Social Determinants of Health   Financial Resource Strain: Low Risk  (08/31/2021)   Overall Financial Resource Strain (CARDIA)    Difficulty of Paying Living Expenses: Not hard at all  Food Insecurity: No Food Insecurity (08/31/2021)   Hunger Vital Sign    Worried About Running Out of Food in the Last Year: Never true    [place] in the Last Year: Never true  Transportation Needs: No Transportation Needs (08/31/2021)   PRAPARE - Hydrologist (Medical): No    Lack of Transportation (Non-Medical): No  Physical Activity: Inactive (08/31/2021)   Exercise Vital Sign    Days of Exercise per Week: 0 days    Minutes of Exercise per Session: 0 min  Stress: No Stress Concern Present (08/31/2021)   [place]    Feeling of Stress : Only a little  Social Connections: Socially Integrated (08/31/2021)   Social Connection and Isolation Panel [NHANES]    Frequency of Communication with Friends and Family: More than three times a week    Frequency of Social Gatherings with Friends and Family: More than three times a week    Attends Religious Services: More than 4 times per year    Active Member of [employer] or Organizations: Yes    Attends Archivist Meetings: 1 to 4 times per year    Marital Status: Married  Human resources officer Violence: Not At Risk (08/31/2021)   Humiliation, Afraid, Rape, and Kick questionnaire    Fear of Current or Ex-Partner: No    Emotionally Abused: No    Physically Abused: No    Sexually Abused: No   Current Outpatient Medications on File Prior to Visit  Medication Sig Dispense Refill   acetaminophen (TYLENOL) 325 MG tablet Take 650 mg by mouth every 6 (six) hours as needed for moderate pain. (Patient not taking: Reported on 02/13/2022)     BD INSULIN SYRINGE U/F 31G X 5/16" 1 ML MISC USE DAILY AS DIRECTED 100 each 3   COMBIGAN 0.2-0.5 % ophthalmic solution Place 1 drop into both eyes at bedtime.      Continuous Blood Gluc Sensor (FREESTYLE LIBRE SENSOR SYSTEM) MISC USE TO CHECK BLOOD SUGARS 4 TIMES DAILY. Before breakfast, before lunch, before supper, and at bedtime 1 each 3   diclofenac Sodium (VOLTAREN) 1 % GEL Apply 4 g topically 4 (four) times daily. (Patient not taking:  Reported on 02/13/2022) 100 g 1   doxycycline (VIBRAMYCIN) 100 MG capsule Take 1 capsule (100 mg total) by mouth 2 (two) times daily. 28 capsule 0   Dulaglutide (TRULICITY) 3 FX/9.0WI SOPN Inject 3 mg into the skin once a week. 6 mL 3   empagliflozin (JARDIANCE) 10 MG TABS tablet Take 1 tablet (10 mg total) by mouth daily. 90 tablet 3   ENTRESTO 97-103 MG TAKE 1 TABLET TWO TIMES A DAY 180 tablet 3   eplerenone (INSPRA) 25 MG tablet TAKE 1 TABLET DAILY 90 tablet 3    furosemide (LASIX) 20 MG tablet Take 1 tablet (20 mg total) by mouth daily as needed for edema (For a weight gain of 3 pounds overnight or 5 pounds in one week). (Patient not taking: Reported on 11/07/2021) 30 tablet 5   gabapentin (NEURONTIN) 300 MG capsule TAKE 2 CAPSULES TWICE A DAY 360 capsule 1   insulin aspart (NOVOLOG FLEXPEN) 100 UNIT/ML FlexPen INJECT 6-8 UNITS UNDER THE SKIN AT THE TIME OF EACH MEAL (Patient taking differently: Pt stated 20 units in the a.m only   UNDER THE SKIN AT THE TIME OF EACH MEAL) 15 mL 4   insulin glargine (LANTUS) 100 UNIT/ML injection Inject 0.4-0.45 mLs (40-45 Units total) into the skin daily. 10 mL 0   ketorolac (ACULAR) 0.5 % ophthalmic solution      latanoprost (XALATAN) 0.005 % ophthalmic solution Place 1 drop into both eyes at bedtime.   12   lidocaine (LIDODERM) 5 % PLACE 1 PATCH ON THE SKIN DAILY. REMOVE AND DISCARD PATCH WITHIN 12 HOURS OR AS DIRECTED BY DOCTOR 90 patch 1   meclizine (ANTIVERT) 25 MG tablet TAKE 1 TABLET THREE TIMES A DAY AS NEEDED FOR DIZZINESS. 270 tablet 3   metoprolol succinate (TOPROL-XL) 50 MG 24 hr tablet TAKE 1 TABLET DAILY. TAKE WITH OR IMMEDIATELY FOLLOWING A MEAL Strength: 50 mg 90 tablet 1   omeprazole (PRILOSEC) 40 MG capsule Take 1 capsule (40 mg total) by mouth daily. 90 capsule 1   oxybutynin (DITROPAN XL) 10 MG 24 hr tablet Take 1 tablet (10 mg total) by mouth at bedtime. 90 tablet 1   rivaroxaban (XARELTO) 20 MG TABS tablet Take 1 tablet (20 mg total) by mouth daily with supper. 90 tablet 1   rOPINIRole (REQUIP) 1 MG tablet Take 1 tablet (1 mg total) by mouth daily. 90 tablet 3   rosuvastatin (CRESTOR) 20 MG tablet Take 1 tablet (20 mg total) by mouth daily. 90 tablet 1   sertraline (ZOLOFT) 100 MG tablet Take 1 tablet (100 mg total) by mouth daily. 90 tablet 3   SURE COMFORT PEN NEEDLES 31G X 5 MM MISC USE TO INJECT INSULIN UNDER THE SKIN 100 each 11   No current facility-administered medications on file prior to visit.    No Known Allergies Family History  Problem Relation Age of Onset   Heart failure Mother    Stroke Mother    Stroke Father    Heart disease Sister    Heart disease Sister    Cancer Sister        liver   Cancer Brother        lung   Cancer Brother        lung   Heart disease Brother    PE: BP 120/64 (BP Location: Right Arm, Patient Position: Sitting, Cuff Size: Normal)   Pulse 95   Ht '5\' 11"'$  (1.803 m)   Wt 233 lb 12.8 oz (106.1  kg)   SpO2 95%   BMI 32.61 kg/m  Wt Readings from Last 3 Encounters:  05/31/22 233 lb 12.8 oz (106.1 kg)  05/25/22 231 lb (104.8 kg)  05/10/22 231 lb 9.6 oz (105.1 kg)   Constitutional: overweight, in NAD Eyes: EOMI, no exophthalmos ENT: no thyromegaly, no cervical lymphadenopathy Cardiovascular: RRR, No MRG Respiratory: CTA B Musculoskeletal: no deformities Skin: moist, warm, no rashes Neurological: + tremor with outstretched hands  ASSESSMENT: 1. DM2, insulin-dependent, uncontrolled, with complications: - CAD with ?h/o AMI - nonischemic CMP - CHF, s/p AICD - Afib, s/p pacemaker - cerebrovascular disease with h/o TIA - CKD stage III - peripheral neuropathy - ED  No family history of medullary thyroid cancer or personal history of pancreatitis.  2. HL  PLAN:  1. Patient with longstanding, uncontrolled, type 2 diabetes, on oral antidiabetic regimen with SGLT2 inhibitor and also on weekly GLP-1 receptor agonist and basal/bolus insulin regimen adjusted at last visit.  At that time, sugars are fluctuating at the higher end of the normal range, with occasional hyperglycemic spikes especially after sweets.  The most significant hyperglycemic spike was around 3 AM, after eating sweets at night.  We discussed about the importance of stopping these.  After she has brunch, sugars are actually dropping to the low normal range or even lower and I advised him to increase the dose of insulin before this meal.  He was not usually taking NovoLog before  dinner and we discussed that he probably needed this, especially before larger meals.  He was planning to change his diet to include more vegetables and reduce sweets so we did not make other changes in his regimen.  HbA1c at that time was higher, at 7.3%.   CGM interpretation: -At today's visit, we reviewed his CGM downloads: It appears that 85% of values are in target range (goal >70%), while 5% are higher than 180 (goal <25%), and 10% are lower than 70 (goal <4%).  The calculated average blood sugar is 119.  The projected HbA1c for the next 3 months ([facility]) is 6.2%. -Reviewing the CGM trends, sugars have been to be well controlled, but he has hypoglycemic episodes in the afternoon.  Upon questioning, he is using a higher dose of Lantus than recommended, so I advised him to decrease this.  Also, we need to decrease his NovoLog before brunch.  Regarding dinner, he rarely takes the 7-8 units recommended for a larger meal.  I did advise him to use these especially if he eats out and has dessert after the meal or for holiday meals otherwise, we can continue the current regimen.. - I suggested to:  Patient Instructions  Please continue: - Jardiance 10 mg before breakfast - Trulicity 3 mg weekly  Please decrease: - Lantus 42 units at bedtime - NovoLog  10 units before brunch 7-8 units before a larger dinner If you have a snack at night, you may need ~5 units before the snack  NO EATING AFTER DARK.  Please return in 4 months.   - we checked his HbA1c: 6.6% (lower) - advised to check sugars at different times of the day - 4x a day, rotating check times - advised for yearly eye exams >> he is UTD -  At last visit we discussed about possibly trying alpha-lipoic acid for numbness in his feet.  He was already on Neurontin.  At this visit, he did not remember to try alpha lipoid acid.  He continues on Neurontin and ropinirole. - return  to clinic in 4 months  2. HL - Reviewed latest lipid panel from  11/2021: LDL at goal, triglycerides slightly high, HDL slightly low: Lab Results  Component Value Date   CHOL 119 12/06/2021   HDL 36.00 (L) 12/06/2021   LDLCALC 46 12/06/2021   LDLDIRECT 103.0 06/22/2020   TRIG 182.0 (H) 12/06/2021   CHOLHDL 3 12/06/2021  - Continues Crestor 20 mg daily-no side effects  Philemon Kingdom, MD PhD [facility] Endocrinology

## 2022-06-01 ENCOUNTER — Encounter: Payer: Self-pay | Admitting: Family Medicine

## 2022-06-01 ENCOUNTER — Ambulatory Visit (INDEPENDENT_AMBULATORY_CARE_PROVIDER_SITE_OTHER): Payer: Medicare Other | Admitting: Family Medicine

## 2022-06-01 VITALS — BP 101/70 | HR 78 | Temp 97.5°F | Ht 71.0 in | Wt 234.8 lb

## 2022-06-01 DIAGNOSIS — R0781 Pleurodynia: Secondary | ICD-10-CM | POA: Diagnosis not present

## 2022-06-01 DIAGNOSIS — Q766 Other congenital malformations of ribs: Secondary | ICD-10-CM | POA: Diagnosis not present

## 2022-06-01 MED ORDER — OXYCODONE HCL 5 MG PO TABS: ORAL_TABLET | ORAL | 0 refills | Status: DC

## 2022-06-01 NOTE — Progress Notes (Signed)
OFFICE VISIT  06/01/2022  CC:  Chief Complaint  Patient presents with   Follow-up    Right side pain; pt states he feels worse.     Patient is a 81 y.o. male who presents for ongoing pain in right side. A/P as of last visit: "#1 right posterolateral chest wall pain.  Exam favors myofascial pain.  Imaging work-up has not revealed obvious findings to explain his pain.  However, given the right lung base nonspecific findings on his CT 6 months ago I will go ahead and treat with a 2-week course of doxycycline. Hopefully this will improve his cough and then improving the cough will allow his chest wall pain to begin to subside. If worsening prior to follow-up in 2 weeks then will check bone scan. May also consider diagnostic/therapeutic trigger point injection."  INTERIM HX: Pain seems to be worse.  Gets acute spasm/paroxysms when he rotates trunk or bends over. Again, pain located focally right lateral aspect of posterior chest wall in a tennis ball sized area.  It radiates a little bit around to the right side along the inferior border of the lowest rib.  No rash.  No deformity.  No palpable defect.   Past Medical History:  Diagnosis Date   AICD (automatic cardioverter/defibrillator) present 2018   MDT CRT-D.  Fatigue-->completely pacer dependent.  Pacer settings adjusted 12/2017 to allow more chronotrophc variance with ADL's//exertion.   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   02/2016 EF 40%, diffuse hypokinesis, grade 2 DD.  Myoc perf imaging showed EF 32% 04/2016.  Pt upgraded to CRT-D 01/04/17.   Chronic renal insufficiency, stage III (moderate) (HCC) 2015   CrCl about 60 ml/min   Complete heart block (HCC)    Has dual  chamber pacer.   COVID-19 virus infection 01/05/2021   paxlovid   Depression    DOE (dyspnea on exertion)    NYHA class II/III CHF   Dyspnea 2021   with exertion, bending over   Episodic low back pain 01/22/2013   w/intermittent radiculitis (12/2014 his neurologist referred him to pain mgmt for epidural steroid injection)   Erectile dysfunction 2019   due to zoloft--urol rx'd viagra   GERD (gastroesophageal reflux disease)    H/O tilt table evaluation 10/93/2355   negative   Helicobacter pylori gastritis 01/2016   History of adenomatous polyp of colon 10/12/2011   Dr. [name] (3 right side of colon- tubular adenomas removed)   History of cardiovascular stress test 05/28/2012   no ischemia, EF 37%, imaging results are unchanged and within normal variance   History of chronic prostatitis    History of kidney stones    History of vertigo    + Hx of posterior HA's.  Neuro (Dr. Erling Cruz) eval 2011.  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   Hyperlipidemia    Hypertension    Lumbar spondylosis    lumbosacral radiculopathy at L4 by EMG testing, right foot drop (neurologist is Dr. Linus Salmons with [facility] in [place])--neurologist referred him to neurosurgery   Migraine    "used to have them all the time; none for years" (01/04/2017)   Myocardial infarction [facility]) ?1970s   not  entirely certain of this   Nephrolithiasis 07/2012   Left UVJ 2 mm stone with dilation of renal collecting system and slight hydroureter on right   Neuropathy    NICM (nonischemic cardiomyopathy) ([place])    a. 02/2018 Cath: LM nl, LAD min irregs, LCX no, RCA 20d. [id number]. Fick CO/CI 4.4/2.0.   Osteoarthritis, multiple sites    Shoulders, back, knees   Pacemaker 02/05/2012   dual chamber, complete heart block, meddtronic revo, lasted checked 12/2015.  Since no CAD on cath 05/2016, cards recommends upgrade to CRT-D.   Permanent atrial  fibrillation ([place])    DCCV 07/09/13-converted, lasted two days, then back into afib--needs lifetime anticoagulation (Xarelto as of 09/2014)   Prostate cancer screening 09/2017   done by urol annually (normal prostate exam documented + PSA 0.84 as of 10/01/17 urol f/u.  10/2018 urol f/u PSA 0.6, no prostate nodule.   Rectus diastasis    Right ankle sprain 08/2017   w/distal fibula avulsion fx noted on u/s but not plain film-(Dr. Hudnall).   Skin cancer of arm, left    "burned it off" (01/04/2017)   TIA (transient ischemic attack)    L face and L arm weakness. Peri procedural->a. 03/22/2018 following cath. CT head neg. No MRI b/c has pacer. Likely due to embolus to distal branch of RMCA   Type II diabetes mellitus ([place])     Past Surgical History:  Procedure Laterality Date   ABI's Bilateral 05/21/2018   normal   BACK SURGERY     BIV ICD INSERTION CRT-D N/A 01/04/2017   Procedure: BiV ICD ;  Surgeon: Constance Haw, MD;  Location: [facility];  Service: Cardiovascular;  Laterality: N/A;   CARDIAC CATHETERIZATION N/A 06/14/2016   Minimal nonobstructive dz, EF 25-35%.  Procedure: Left Heart Cath and Coronary Angiography;  Surgeon: Peter M Martinique, MD;  Location: [place];  Service: Cardiovascular;  Laterality: N/A;   CARDIOVASCULAR STRESS TEST  2012   2012 nuclear perfusion study: low risk scan; 04/2016 normal myocardial perfusion imaging, EF 32%.   CARDIOVERSION  07/09/2012   Procedure: CARDIOVERSION;  Surgeon: [name], MD;  Location: [facility];  Service: Cardiovascular;  Laterality: N/A;   CATARACT EXTRACTION W/ INTRAOCULAR LENS IMPLANT & ANTERIOR VITRECTOMY, BILATERAL Bilateral    CIRCUMCISION N/A 03/23/2020   Procedure: CIRCUMCISION ADULT;  Surgeon: Remi Haggard, MD;  Location: [facility];  Service: Urology;  Laterality: N/A;   COLONOSCOPY W/ POLYPECTOMY  approx 2006; repeated 09/2011   Polyps on 2013 EGD as well, repeat 12/2014   COLONOSCOPY WITH PROPOFOL N/A  07/08/2021   adenoma x 1. Procedure: COLONOSCOPY WITH PROPOFOL;  Surgeon: Carol Ada, MD;  Location: [facility];  Service: Endoscopy;  Laterality: N/A;   ESOPHAGOGASTRODUODENOSCOPY  10/18/2006   Done due to chronic GERD: Normal, bx showed no barrett's esophagus (Dr. [name])   [place] Left 10/02/2016   Procedure: LEFT RING FINGER WOUND EXPORATION AND FLEXOR TENDON REPAIR AND NERVE REPAIR;  Surgeon: Milly Jakob, MD;  Location: [facility];  Service: Orthopedics;  Laterality: Left;   INSERT / REPLACE / REMOVE PACEMAKER  02/05/2012   dual chamber, sinus node dysfunction, sinus arrest, PAF, Medtronic Revo serial#[id number] H: last checked 05/2015   LUMBAR LAMINECTOMY Left 1976   L4-5   PACEMAKER REMOVAL  01/04/2017   PERMANENT PACEMAKER INSERTION N/A 02/05/2012   Procedure: PERMANENT PACEMAKER INSERTION;  Surgeon: [name], MD; Generator Medtronic Revo model [place] serial number [id number] H Laterality: N/A;   POLYPECTOMY  07/08/2021   Procedure: POLYPECTOMY;  Surgeon: Carol Ada, MD;  Location: [facility];  Service: Endoscopy;;   RETINAL DETACHMENT SURGERY Left ~ [place] Left 2018   Left shoulder reverse TSA ([facility] in [place]).   RIGHT/LEFT HEART CATH AND CORONARY ANGIOGRAPHY N/A 03/22/2018   EF 30-35%, no CAD.  Procedure: RIGHT/LEFT HEART CATH AND CORONARY ANGIOGRAPHY;  Surgeon: Jolaine Artist, MD;  Location: [facility];  Service: Cardiovascular;  Laterality: N/A;   TRANSTHORACIC ECHOCARDIOGRAM  08/25/10; 05/2012; 03/23/16;12/2017   mild asymmetric LVH, normal systolic function, normal diastolic fxn, mild-to-mod mitral regurg, mild aortic valve sclerosis and trace AI, mild aortic root dilatation. 2014 f/u showed EF 40-45%, mod LAE, A FIB.  02/2016 EF 40%, diffuse hypokinesis, grade 2 DD. 12/2017 EF 35-40%,diffuse hypokin,grd III DD, mild MR   WISDOM TOOTH EXTRACTION  06/27/2021   EF 50-55%, mild aortic root/ascending aorta  dilatation (41-42 mm).    Outpatient Medications Prior to Visit  Medication Sig Dispense Refill   BD INSULIN SYRINGE U/F 31G X 5/16" 1 ML MISC USE DAILY AS DIRECTED 100 each 3   COMBIGAN 0.2-0.5 % ophthalmic solution Place 1 drop into both eyes at bedtime.      Continuous Blood Gluc Receiver (FREESTYLE LIBRE 2 READER) DEVI 1 each by Does not apply route daily. 1 each 3   Continuous Blood Gluc Sensor (FREESTYLE LIBRE 2 SENSOR) MISC 1 each by Does not apply route every 14 (fourteen) days. 6 each 3   doxycycline (VIBRAMYCIN) 100 MG capsule Take 1 capsule (100 mg total) by mouth 2 (two) times daily. 28 capsule 0   Dulaglutide (TRULICITY) 3 DJ/2.4QA SOPN Inject 3 mg into the skin once a week. 6 mL 3   empagliflozin (JARDIANCE) 10 MG TABS tablet Take 1 tablet (10 mg total) by mouth daily. 90 tablet 3   ENTRESTO 97-103 MG TAKE 1 TABLET TWO TIMES A DAY 180 tablet 3   eplerenone (INSPRA) 25 MG tablet TAKE 1 TABLET DAILY 90 tablet 3   gabapentin (NEURONTIN) 300 MG capsule TAKE 2 CAPSULES TWICE A DAY 360 capsule 1   insulin aspart (NOVOLOG FLEXPEN) 100 UNIT/ML FlexPen INJECT 6-8 UNITS UNDER THE SKIN AT THE TIME OF EACH MEAL (Patient taking differently: Pt stated 20 units in the a.m only   UNDER THE SKIN AT THE TIME OF EACH MEAL) 15 mL 4   insulin glargine (LANTUS) 100 UNIT/ML injection Inject 0.4-0.45 mLs (40-45 Units total) into the skin daily. 10 mL 0   ketorolac (ACULAR) 0.5 % ophthalmic solution      latanoprost (XALATAN) 0.005 % ophthalmic solution Place 1 drop into both eyes at bedtime.   12   lidocaine (LIDODERM) 5 % PLACE 1 PATCH ON THE SKIN DAILY. REMOVE AND DISCARD PATCH WITHIN 12 HOURS OR AS DIRECTED BY DOCTOR 90 patch 0   meclizine (ANTIVERT) 25 MG tablet TAKE 1 TABLET THREE TIMES A DAY AS NEEDED FOR DIZZINESS. 270 tablet 3   metoprolol succinate (TOPROL-XL) 50 MG 24 hr tablet TAKE 1 TABLET DAILY. TAKE WITH OR IMMEDIATELY FOLLOWING A MEAL Strength: 50 mg 90 tablet 1   omeprazole (PRILOSEC) 40  MG capsule Take 1 capsule (40 mg total) by mouth daily. 90 capsule 1   oxybutynin (DITROPAN XL) 10 MG 24 hr tablet Take 1 tablet (10 mg total) by mouth at bedtime. 90 tablet 1   rivaroxaban (XARELTO) 20 MG TABS tablet Take 1 tablet (20 mg total) by mouth daily with  supper. 90 tablet 1   rOPINIRole (REQUIP) 1 MG tablet Take 1 tablet (1 mg total) by mouth daily. 90 tablet 3   rosuvastatin (CRESTOR) 20 MG tablet Take 1 tablet (20 mg total) by mouth daily. 90 tablet 1   sertraline (ZOLOFT) 100 MG tablet Take 1 tablet (100 mg total) by mouth daily. 90 tablet 3   SURE COMFORT PEN NEEDLES 31G X 5 MM MISC USE TO INJECT INSULIN UNDER THE SKIN 100 each 11   acetaminophen (TYLENOL) 325 MG tablet Take 650 mg by mouth every 6 (six) hours as needed for moderate pain. (Patient not taking: Reported on 02/13/2022)     diclofenac Sodium (VOLTAREN) 1 % GEL Apply 4 g topically 4 (four) times daily. (Patient not taking: Reported on 02/13/2022) 100 g 1   furosemide (LASIX) 20 MG tablet Take 1 tablet (20 mg total) by mouth daily as needed for edema (For a weight gain of 3 pounds overnight or 5 pounds in one week). (Patient not taking: Reported on 11/07/2021) 30 tablet 5   No facility-administered medications prior to visit.    No Known Allergies  ROS As per HPI  PE:    06/01/2022    8:51 AM 05/31/2022    9:58 AM 05/25/2022    1:50 PM  Vitals with BMI  Height '5\' 11"'$  '5\' 11"'$    Weight 234 lbs 13 oz 233 lbs 13 oz 231 lbs  BMI 32.76 24.58 09.98  Systolic 338 250 539  Diastolic 70 64 75  Pulse 78 95 80   Physical Exam  Gen: Alert, well appearing.  Patient is oriented to person, place, time, and situation. Movements to get up on the table elicit spasms of pain. Her to palpation focally in right lateral aspect of posterior chest wall in a tennis ball sized area (approximately in line with the acromion).  It radiates a little bit around to the right side along the inferior border of the lowest rib.  No rash.  No  deformity.  No palpable defect  LABS:  Last CBC Lab Results  Component Value Date   WBC 5.6 06/09/2021   HGB 16.0 06/09/2021   HCT 49.2 06/09/2021   MCV 92.5 06/09/2021   MCH 30.3 03/19/2020   RDW 13.6 06/09/2021   PLT 129.0 (L) 76/73/4193   Last metabolic panel Lab Results  Component Value Date   GLUCOSE 150 (H) 12/06/2021   NA 139 12/06/2021   K 4.4 12/06/2021   CL 101 12/06/2021   CO2 30 12/06/2021   BUN 20 12/06/2021   CREATININE 1.58 (H) 12/06/2021   GFRNONAA 37 (L) 03/23/2020   CALCIUM 9.2 12/06/2021   PROT 6.9 12/06/2021   ALBUMIN 4.1 12/06/2021   BILITOT 0.5 12/06/2021   ALKPHOS 72 12/06/2021   AST 18 12/06/2021   ALT 13 12/06/2021   ANIONGAP 9 03/23/2020    IMPRESSION AND PLAN:  #1 right posterolateral chest wall pain.  Has been present about 6 months now.  Unknown etiology.  Working diagnosis has been musculoskeletal sprain/strain. Back when he started having the posterolateral right chest wall pain back in April this year his initial ribs x-ray showed a mild deformity in the posterior aspect of the right 10th, 11th, and 12th ribs without break in the cortical margins, possibly old healed fractures. Follow-up CT of chest without contrast was done a couple weeks later when he had not improved and this did not show any acute or chronic/old rib abnormalities.  We will recheck plain radiographs  of the right ribs.  If negative we will do bone scan. If all unrevealing and will refer to orthopedics (?thoracic radiculopathy?-->but I wouldn't expect this to be tender to palpation).  We will do trial of oxycodone 5 mg twice daily as needed pain (not nsaid candidate d/t being on xarelto), #30.  An After Visit Summary was printed and given to the patient.  FOLLOW UP: Return in about 2 weeks (around 06/15/2022) for f/u rib pain.  Signed:  Crissie Sickles, MD           06/01/2022

## 2022-06-02 ENCOUNTER — Ambulatory Visit (HOSPITAL_BASED_OUTPATIENT_CLINIC_OR_DEPARTMENT_OTHER)
Admission: RE | Admit: 2022-06-02 | Discharge: 2022-06-02 | Disposition: A | Discharge status: Home / Self Care | Payer: Medicare Other | Source: Ambulatory Visit | Attending: Family Medicine | Admitting: Family Medicine

## 2022-06-02 ENCOUNTER — Other Ambulatory Visit (HOSPITAL_COMMUNITY): Payer: Self-pay

## 2022-06-02 DIAGNOSIS — R0781 Pleurodynia: Secondary | ICD-10-CM | POA: Diagnosis not present

## 2022-06-02 DIAGNOSIS — I517 Cardiomegaly: Secondary | ICD-10-CM | POA: Diagnosis not present

## 2022-06-02 DIAGNOSIS — Z95810 Presence of automatic (implantable) cardiac defibrillator: Secondary | ICD-10-CM | POA: Diagnosis not present

## 2022-06-02 DIAGNOSIS — Q766 Other congenital malformations of ribs: Secondary | ICD-10-CM | POA: Insufficient documentation

## 2022-06-05 ENCOUNTER — Other Ambulatory Visit (HOSPITAL_COMMUNITY): Payer: Self-pay

## 2022-06-06 ENCOUNTER — Encounter: Payer: Self-pay | Admitting: Family Medicine

## 2022-06-06 DIAGNOSIS — M5414 Radiculopathy, thoracic region: Secondary | ICD-10-CM

## 2022-06-06 NOTE — Telephone Encounter (Signed)
Okay, I just ordered an MRI of his back to see if he has a pinched nerve. Sometimes having a pacemaker is a problem with trying to get an MRI but they will call him to ask some specific information about his device. In the meantime make sure he is doing lidocaine patches to the area.  If he does not have any right now please do a prescription for 30 patches--it is on his med list. Also, does he want to medicine like hydrocodone or oxycodone short-term?. Keep plan for follow-up 06/13/2022.

## 2022-06-06 NOTE — Telephone Encounter (Signed)
fyi  Patient called back.  He is aware of MRI ordered by Dr. Anitra Lauth.  He will wait on their call.  He stated he has oxycodone already, but stopped taking because they were not helping.  Patient to keep appt on 11/21 with Dr. Anitra Lauth

## 2022-06-07 ENCOUNTER — Telehealth: Payer: Self-pay | Admitting: Family Medicine

## 2022-06-07 NOTE — Telephone Encounter (Signed)
Pt states he has tried ice, ice packs, heating pad, and he stopped taking the oxycodone because it was not helping. He is still using the lidocaine patches.  He said he needs some relief due to severity of pain and affecting his ability to do certain things.

## 2022-06-07 NOTE — Telephone Encounter (Signed)
Please give patient a call back. He is unaware of an MRI that was ordered for him and continues to complain of pain. He is scheduled for an appt next week

## 2022-06-08 ENCOUNTER — Telehealth: Payer: Self-pay | Admitting: Pharmacy Technician

## 2022-06-08 ENCOUNTER — Other Ambulatory Visit (HOSPITAL_COMMUNITY): Payer: Self-pay

## 2022-06-08 ENCOUNTER — Ambulatory Visit: Payer: Medicare Other | Admitting: Family Medicine

## 2022-06-08 NOTE — Telephone Encounter (Signed)
Update regarding MRI: " The [facility] MRI truck does not take patients with a pacemaker. I was told to sent internal so the Center schedule center calls and set up the appt."

## 2022-06-08 NOTE — Telephone Encounter (Signed)
LM for pt to returncall

## 2022-06-08 NOTE — Telephone Encounter (Signed)
Checking location for MRI for further update

## 2022-06-08 NOTE — Telephone Encounter (Signed)
Please tell him that I have nothing else to offer for pain other than opioids. We can try hydrocodone but I do not know that that will work any better for him because oxycodone is typically more potent. Can we check on the MRI situation?

## 2022-06-08 NOTE — Telephone Encounter (Signed)
Unfortunately, not much left at our disposal to offer. Increase gabapentin to 3 tabs twice a day. Was he trying 1 oxycodone or 2 at a time?  Did the oxycodone make him drowsy?

## 2022-06-08 NOTE — Telephone Encounter (Signed)
Pt states he has been taking Gabapentin, 3 tablets twice a day. He was taking 1 oxycodone in the morning and possibly another at night. Denies drowsiness from medication but it was not helpful. He is inquiring about MRI. Advised they should contact him to schedule but it may not be appropriate due to pacemaker. He is asking for relief. Pain is not getting any better.  Please further advise.

## 2022-06-08 NOTE — Telephone Encounter (Signed)
Pharmacy Patient Advocate Encounter   Received notification from [facility] that prior authorization for Freestyle Libre 2 Sensors and reader is required/requested.  PA submitted on 06/08/22 to [employer] via [place] (sensors),  [id number] (reader) Status is pending

## 2022-06-08 NOTE — Telephone Encounter (Signed)
Please call pt back to discuss. He missed the call.

## 2022-06-09 NOTE — Telephone Encounter (Signed)
Tried calling patient, unable to LVM.  

## 2022-06-09 NOTE — Telephone Encounter (Signed)
LM for pt to returncall

## 2022-06-12 ENCOUNTER — Other Ambulatory Visit (HOSPITAL_COMMUNITY): Payer: Self-pay

## 2022-06-12 NOTE — Telephone Encounter (Signed)
ERROR

## 2022-06-12 NOTE — Telephone Encounter (Signed)
LM for pt to return call. If pt returns call, please see last message from PCP regarding med recommendations. Someone from [facility] will contact him to schedule MRI

## 2022-06-12 NOTE — Telephone Encounter (Signed)
Pharmacy Patient Advocate Encounter  Received notification from [employer] that the request for prior authorization for Freestyle Elenor [place] has been denied due to not having documentation that the pt agrees to wear CGM as directed.  [name]

## 2022-06-12 NOTE — Telephone Encounter (Signed)
Patient returned call from this morning.  Patient is aware that someone from [facility] will be in contact with him to schedule MRI. Patient has a pacemaker.

## 2022-06-13 ENCOUNTER — Ambulatory Visit: Payer: Medicare Other | Admitting: Family Medicine

## 2022-06-14 ENCOUNTER — Encounter: Payer: Self-pay | Admitting: Family Medicine

## 2022-06-14 ENCOUNTER — Ambulatory Visit (INDEPENDENT_AMBULATORY_CARE_PROVIDER_SITE_OTHER): Payer: Medicare Other | Admitting: Family Medicine

## 2022-06-14 VITALS — BP 127/80 | HR 86 | Temp 98.6°F | Wt 230.4 lb

## 2022-06-14 DIAGNOSIS — M5414 Radiculopathy, thoracic region: Secondary | ICD-10-CM | POA: Diagnosis not present

## 2022-06-14 NOTE — Progress Notes (Signed)
OFFICE VISIT  06/14/2022  CC:  Chief Complaint  Patient presents with   Chest Pain    Patient is a 81 y.o. male who presents for 2-week follow-up right posterolateral chest wall pain. A/P as of last visit: "1 right posterolateral chest wall pain.  Has been present about 6 months now.  Unknown etiology.  Working diagnosis has been musculoskeletal sprain/strain. Back when he started having the posterolateral right chest wall pain back in April this year his initial ribs x-ray showed a mild deformity in the posterior aspect of the right 10th, 11th, and 12th ribs without break in the cortical margins, possibly old healed fractures. Follow-up CT of chest without contrast was done a couple weeks later when he had not improved and this did not show any acute or chronic/old rib abnormalities.   We will recheck plain radiographs of the right ribs.  If negative we will do bone scan. If all unrevealing and will refer to orthopedics (?thoracic radiculopathy?-->but I wouldn't expect this to be tender to palpation).   We will do trial of oxycodone 5 mg twice daily as needed pain (not nsaid candidate d/t being on xarelto), #30."  INTERIM HX: Radiographs of the right ribs on 06/02/2022 showed no abnormality. Pain is still very bad at times, starts R lower thoracic level on R posterior, sometimes right next to spine, radiates around R side to mid abdomen Some positions such as lateral bending/twisting.  Relieved completely by repositioning.  Past Medical History:  Diagnosis Date   AICD (automatic cardioverter/defibrillator) present 2018   MDT CRT-D.  Fatigue-->completely pacer dependent.  Pacer settings adjusted 12/2017 to allow more chronotrophc variance with ADL's//exertion.   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   02/2016 EF 40%, diffuse hypokinesis, grade 2 DD.  Myoc perf imaging showed EF 32% 04/2016.  Pt upgraded to CRT-D 01/04/17.   Chronic renal insufficiency, stage III (moderate) (HCC) 2015   CrCl about 60 ml/min   Complete heart block (HCC)    Has dual chamber pacer.   COVID-19 virus infection 01/05/2021   paxlovid   Depression    DOE (dyspnea on exertion)    NYHA class II/III CHF   Dyspnea 2021   with exertion, bending over   Episodic low back pain 01/22/2013   w/intermittent radiculitis (12/2014 his neurologist referred him to pain mgmt for epidural steroid injection)   Erectile dysfunction 2019   due to zoloft--urol rx'd viagra   GERD (gastroesophageal reflux disease)    H/O tilt table evaluation 03/55/9741   negative   Helicobacter pylori gastritis 01/2016   History of adenomatous polyp of colon 10/12/2011   Dr. [name] (3 right side of colon- tubular adenomas removed)   History of cardiovascular stress test 05/28/2012   no ischemia, EF 37%, imaging results are unchanged and within normal variance   History of chronic prostatitis    History of kidney stones    History of vertigo    + Hx of posterior HA's.  Neuro (Dr. Erling Cruz) eval 2011.  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   Hyperlipidemia    Hypertension    Lumbar spondylosis    lumbosacral radiculopathy at L4 by EMG  testing, right foot drop (neurologist is Dr. Linus Salmons with [facility] in [place])--neurologist referred him to neurosurgery   Migraine    "used to have them all the time; none for years" (01/04/2017)   Myocardial infarction [facility]) ?1970s   not entirely certain of this   Nephrolithiasis 07/2012   Left UVJ 2 mm stone with dilation of renal collecting system and slight hydroureter on right   Neuropathy    NICM (nonischemic cardiomyopathy) ([place])    a.  02/2018 Cath: LM nl, LAD min irregs, LCX no, RCA 20d. [id number]. Fick CO/CI 4.4/2.0.   Osteoarthritis, multiple sites    Shoulders, back, knees   Pacemaker 02/05/2012   dual chamber, complete heart block, meddtronic revo, lasted checked 12/2015.  Since no CAD on cath 05/2016, cards recommends upgrade to CRT-D.   Permanent atrial fibrillation ([place])    DCCV 07/09/13-converted, lasted two days, then back into afib--needs lifetime anticoagulation (Xarelto as of 09/2014)   Prostate cancer screening 09/2017   done by urol annually (normal prostate exam documented + PSA 0.84 as of 10/01/17 urol f/u.  10/2018 urol f/u PSA 0.6, no prostate nodule.   Rectus diastasis    Right ankle sprain 08/2017   w/distal fibula avulsion fx noted on u/s but not plain film-(Dr. Hudnall).   Skin cancer of arm, left    "burned it off" (01/04/2017)   TIA (transient ischemic attack)    L face and L arm weakness. Peri procedural->a. 03/22/2018 following cath. CT head neg. No MRI b/c has pacer. Likely due to embolus to distal branch of RMCA   Type II diabetes mellitus ([place])     Past Surgical History:  Procedure Laterality Date   ABI's Bilateral 05/21/2018   normal   BACK SURGERY     BIV ICD INSERTION CRT-D N/A 01/04/2017   Procedure: BiV ICD ;  Surgeon: Constance Haw, MD;  Location: [facility];  Service: Cardiovascular;  Laterality: N/A;   CARDIAC CATHETERIZATION N/A 06/14/2016   Minimal nonobstructive dz, EF 25-35%.  Procedure: Left Heart Cath and Coronary Angiography;  Surgeon: Peter M Martinique, MD;  Location: [facility];  Service: Cardiovascular;  Laterality: N/A;   CARDIOVASCULAR STRESS TEST  2012   2012 nuclear perfusion study: low risk scan; 04/2016 normal myocardial perfusion imaging, EF 32%.   CARDIOVERSION  07/09/2012   Procedure: CARDIOVERSION;  Surgeon: [name], MD;  Location: [facility];  Service: Cardiovascular;  Laterality: N/A;   CATARACT EXTRACTION W/ INTRAOCULAR LENS IMPLANT &  ANTERIOR VITRECTOMY, BILATERAL Bilateral    CIRCUMCISION N/A 03/23/2020   Procedure: CIRCUMCISION ADULT;  Surgeon: Remi Haggard, MD;  Location: [facility];  Service: Urology;  Laterality: N/A;   COLONOSCOPY W/ POLYPECTOMY  approx 2006; repeated 09/2011   Polyps on 2013 EGD as well, repeat 12/2014   COLONOSCOPY WITH PROPOFOL N/A 07/08/2021   adenoma x 1. Procedure: COLONOSCOPY WITH PROPOFOL;  Surgeon: Carol Ada, MD;  Location: [facility];  Service: Endoscopy;  Laterality: N/A;   ESOPHAGOGASTRODUODENOSCOPY  10/18/2006   Done due to chronic GERD: Normal, bx showed no barrett's esophagus (Dr. [name])   [place] Left 10/02/2016   Procedure: LEFT RING FINGER WOUND EXPORATION AND FLEXOR TENDON REPAIR AND NERVE REPAIR;  Surgeon: Milly Jakob, MD;  Location: [facility];  Service: Orthopedics;  Laterality: Left;   INSERT / REPLACE / REMOVE PACEMAKER  02/05/2012   dual chamber, sinus node dysfunction, sinus arrest, PAF, Medtronic Revo serial#[id number] H: last checked 05/2015   LUMBAR  LAMINECTOMY Left 1976   L4-5   PACEMAKER REMOVAL  01/04/2017   PERMANENT PACEMAKER INSERTION N/A 02/05/2012   Procedure: PERMANENT PACEMAKER INSERTION;  Surgeon: [name], MD; Generator Medtronic Wineglass model [place] serial number [id number] H Laterality: N/A;   POLYPECTOMY  07/08/2021   Procedure: POLYPECTOMY;  Surgeon: Carol Ada, MD;  Location: [facility];  Service: Endoscopy;;   RETINAL DETACHMENT SURGERY Left ~ [place] Left 2018   Left shoulder reverse [name] in [place]).   RIGHT/LEFT HEART CATH AND CORONARY ANGIOGRAPHY N/A 03/22/2018   EF 30-35%, no CAD.  Procedure: RIGHT/LEFT HEART CATH AND CORONARY ANGIOGRAPHY;  Surgeon: Jolaine Artist, MD;  Location: [facility];  Service: Cardiovascular;  Laterality: N/A;   TRANSTHORACIC ECHOCARDIOGRAM  08/25/10; 05/2012; 03/23/16;12/2017   mild asymmetric LVH, normal systolic function, normal diastolic fxn,  mild-to-mod mitral regurg, mild aortic valve sclerosis and trace AI, mild aortic root dilatation. 2014 f/u showed EF 40-45%, mod LAE, A FIB.  02/2016 EF 40%, diffuse hypokinesis, grade 2 DD. 12/2017 EF 35-40%,diffuse hypokin,grd III DD, mild MR   WISDOM TOOTH EXTRACTION  06/27/2021   EF 50-55%, mild aortic root/ascending aorta dilatation (41-42 mm).    Outpatient Medications Prior to Visit  Medication Sig Dispense Refill   BD INSULIN SYRINGE U/F 31G X 5/16" 1 ML MISC USE DAILY AS DIRECTED 100 each 3   COMBIGAN 0.2-0.5 % ophthalmic solution Place 1 drop into both eyes at bedtime.      Continuous Blood Gluc Receiver (FREESTYLE LIBRE 2 READER) DEVI 1 each by Does not apply route daily. 1 each 3   Continuous Blood Gluc Sensor (FREESTYLE LIBRE 2 SENSOR) MISC 1 each by Does not apply route every 14 (fourteen) days. 6 each 3   diclofenac Sodium (VOLTAREN) 1 % GEL Apply 4 g topically 4 (four) times daily. 100 g 1   Dulaglutide (TRULICITY) 3 DX/8.3JA SOPN Inject 3 mg into the skin once a week. 6 mL 3   empagliflozin (JARDIANCE) 10 MG TABS tablet Take 1 tablet (10 mg total) by mouth daily. 90 tablet 3   ENTRESTO 97-103 MG TAKE 1 TABLET TWO TIMES A DAY 180 tablet 3   eplerenone (INSPRA) 25 MG tablet TAKE 1 TABLET DAILY 90 tablet 3   gabapentin (NEURONTIN) 300 MG capsule TAKE 2 CAPSULES TWICE A DAY 360 capsule 1   insulin aspart (NOVOLOG FLEXPEN) 100 UNIT/ML FlexPen INJECT 6-8 UNITS UNDER THE SKIN AT THE TIME OF EACH MEAL (Patient taking differently: Pt stated 20 units in the a.m only   UNDER THE SKIN AT THE TIME OF EACH MEAL) 15 mL 4   insulin glargine (LANTUS) 100 UNIT/ML injection Inject 0.4-0.45 mLs (40-45 Units total) into the skin daily. 10 mL 0   ketorolac (ACULAR) 0.5 % ophthalmic solution      latanoprost (XALATAN) 0.005 % ophthalmic solution Place 1 drop into both eyes at bedtime.   12   lidocaine (LIDODERM) 5 % PLACE 1 PATCH ON THE SKIN DAILY. REMOVE AND DISCARD PATCH WITHIN 12 HOURS OR AS DIRECTED  BY DOCTOR 90 patch 0   meclizine (ANTIVERT) 25 MG tablet TAKE 1 TABLET THREE TIMES A DAY AS NEEDED FOR DIZZINESS. 270 tablet 3   metoprolol succinate (TOPROL-XL) 50 MG 24 hr tablet TAKE 1 TABLET DAILY. TAKE WITH OR IMMEDIATELY FOLLOWING A MEAL Strength: 50 mg 90 tablet 1   omeprazole (PRILOSEC) 40 MG capsule Take 1 capsule (40 mg total) by mouth daily. 90 capsule 1  oxybutynin (DITROPAN XL) 10 MG 24 hr tablet Take 1 tablet (10 mg total) by mouth at bedtime. 90 tablet 1   oxyCODONE (OXY IR/ROXICODONE) 5 MG immediate release tablet 1 tab po bid prn pain 30 tablet 0   rivaroxaban (XARELTO) 20 MG TABS tablet Take 1 tablet (20 mg total) by mouth daily with supper. 90 tablet 1   rOPINIRole (REQUIP) 1 MG tablet Take 1 tablet (1 mg total) by mouth daily. 90 tablet 3   rosuvastatin (CRESTOR) 20 MG tablet Take 1 tablet (20 mg total) by mouth daily. 90 tablet 1   sertraline (ZOLOFT) 100 MG tablet Take 1 tablet (100 mg total) by mouth daily. 90 tablet 3   SURE COMFORT PEN NEEDLES 31G X 5 MM MISC USE TO INJECT INSULIN UNDER THE SKIN 100 each 11   acetaminophen (TYLENOL) 325 MG tablet Take 650 mg by mouth every 6 (six) hours as needed for moderate pain. (Patient not taking: Reported on 02/13/2022)     furosemide (LASIX) 20 MG tablet Take 1 tablet (20 mg total) by mouth daily as needed for edema (For a weight gain of 3 pounds overnight or 5 pounds in one week). (Patient not taking: Reported on 11/07/2021) 30 tablet 5   doxycycline (VIBRAMYCIN) 100 MG capsule Take 1 capsule (100 mg total) by mouth 2 (two) times daily. (Patient not taking: Reported on 06/14/2022) 28 capsule 0   No facility-administered medications prior to visit.    No Known Allergies  ROS As per HPI  PE:    06/14/2022   10:59 AM 06/01/2022    8:51 AM 05/31/2022    9:58 AM  Vitals with BMI  Height  '5\' 11"'$  '5\' 11"'$   Weight 230 lbs 6 oz 234 lbs 13 oz 233 lbs 13 oz  BMI 32.15 40.10 27.25  Systolic 366 440 347  Diastolic 80 70 64  Pulse 86  78 95     Physical Exam  Back: no focal area of tenderness. No rash.  LABS:  Last CBC Lab Results  Component Value Date   WBC 5.6 06/09/2021   HGB 16.0 06/09/2021   HCT 49.2 06/09/2021   MCV 92.5 06/09/2021   MCH 30.3 03/19/2020   RDW 13.6 06/09/2021   PLT 129.0 (L) 42/59/5638   Last metabolic panel Lab Results  Component Value Date   GLUCOSE 150 (H) 12/06/2021   NA 139 12/06/2021   K 4.4 12/06/2021   CL 101 12/06/2021   CO2 30 12/06/2021   BUN 20 12/06/2021   CREATININE 1.58 (H) 12/06/2021   GFRNONAA 37 (L) 03/23/2020   CALCIUM 9.2 12/06/2021   PROT 6.9 12/06/2021   ALBUMIN 4.1 12/06/2021   BILITOT 0.5 12/06/2021   ALKPHOS 72 12/06/2021   AST 18 12/06/2021   ALT 13 12/06/2021   ANIONGAP 9 03/23/2020   IMPRESSION AND PLAN:  Right thoracic pain, suspect thoracic radiculopathy. Has been going on approx 7 mo, no improvement with prednisone, a full course of PT.  Not responsive to any pain meds. Ordered MRI T spine last week but don't know why nothing was set up, etc. He has ICD. Discussed with radiology today, gave device details.  They'll check on compatibility and schedule if allowed for next week.  No med changes today.  An After Visit Summary was printed and given to the patient.  FOLLOW UP: Return for TBD based on results of MRI.  Signed:  Crissie Sickles, MD           06/14/2022

## 2022-06-19 ENCOUNTER — Other Ambulatory Visit: Payer: Self-pay | Admitting: Internal Medicine

## 2022-06-19 DIAGNOSIS — R0781 Pleurodynia: Secondary | ICD-10-CM | POA: Diagnosis not present

## 2022-06-19 DIAGNOSIS — K219 Gastro-esophageal reflux disease without esophagitis: Secondary | ICD-10-CM | POA: Diagnosis not present

## 2022-06-19 DIAGNOSIS — E1165 Type 2 diabetes mellitus with hyperglycemia: Secondary | ICD-10-CM

## 2022-06-21 ENCOUNTER — Ambulatory Visit (INDEPENDENT_AMBULATORY_CARE_PROVIDER_SITE_OTHER): Payer: Medicare Other | Admitting: Surgical

## 2022-06-21 ENCOUNTER — Other Ambulatory Visit (HOSPITAL_BASED_OUTPATIENT_CLINIC_OR_DEPARTMENT_OTHER): Payer: Medicare Other

## 2022-06-21 ENCOUNTER — Ambulatory Visit
Admission: RE | Admit: 2022-06-21 | Discharge: 2022-06-21 | Disposition: A | Discharge status: Home / Self Care | Payer: Medicare Other | Source: Ambulatory Visit | Attending: Surgery | Admitting: Surgery

## 2022-06-21 VITALS — BP 122/85 | HR 83 | Resp 20 | Ht 71.0 in | Wt 228.0 lb

## 2022-06-21 DIAGNOSIS — I7121 Aneurysm of the ascending aorta, without rupture: Secondary | ICD-10-CM

## 2022-06-21 DIAGNOSIS — I700 Atherosclerosis of aorta: Secondary | ICD-10-CM | POA: Diagnosis not present

## 2022-06-21 NOTE — Progress Notes (Signed)
Subjective:     Patient ID: Mason [name], male    DOB: 28-Apr-1941, 81 y.o.   MRN: [id number]  Chief Complaint  Patient presents with   Thoracic Aortic Aneurysm    6 month f/u with Chest CT   Past Medical History:  Diagnosis Date   AICD (automatic cardioverter/defibrillator) present 2018   MDT CRT-D.  Fatigue-->completely pacer dependent.  Pacer settings adjusted 12/2017 to allow more chronotrophc variance with ADL's//exertion.   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   02/2016 EF 40%, diffuse hypokinesis, grade 2 DD.  Myoc perf imaging showed EF 32% 04/2016.  Pt upgraded to CRT-D 01/04/17.   Chronic renal insufficiency, stage III (moderate) (HCC) 2015   CrCl about 60 ml/min   Complete heart block (HCC)    Has dual chamber pacer.   COVID-19 virus infection 01/05/2021   paxlovid   Depression    DOE (dyspnea on exertion)    NYHA class II/III CHF   Dyspnea 2021   with exertion, bending over   Episodic low back pain 01/22/2013   w/intermittent radiculitis (12/2014 his neurologist referred him to pain mgmt for epidural steroid injection)   Erectile dysfunction 2019   due to zoloft--urol rx'd viagra   GERD (gastroesophageal reflux disease)    H/O tilt table evaluation 95/03/3266   negative   Helicobacter pylori gastritis 01/2016   History of adenomatous polyp of colon 10/12/2011   Dr. [name] (3 right side of colon- tubular adenomas removed)   History of cardiovascular stress test 05/28/2012   no ischemia, EF 37%, imaging results are unchanged and within normal variance   History of chronic prostatitis    History of kidney stones    History of vertigo    + Hx of posterior  HA's.  Neuro (Dr. Erling Cruz) eval 2011.  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   Hyperlipidemia    Hypertension    Lumbar spondylosis    lumbosacral radiculopathy at L4 by EMG testing, right foot drop (neurologist is Dr. Linus Salmons with [facility] in [place])--neurologist referred him to neurosurgery   Migraine    "used to have them all the time; none for years" (01/04/2017)   Myocardial infarction [facility]) ?1970s   not entirely certain of this   Nephrolithiasis 07/2012   Left UVJ 2 mm stone with dilation of renal collecting system and slight hydroureter on right   Neuropathy    NICM (nonischemic cardiomyopathy) ([place])    a. 02/2018 Cath: LM nl, LAD min irregs, LCX no, RCA 20d. [id number]. Fick CO/CI 4.4/2.0.   Osteoarthritis, multiple sites    Shoulders, back, knees   Pacemaker 02/05/2012   dual chamber, complete heart block, meddtronic revo, lasted checked 12/2015.  Since no CAD on cath 05/2016, cards recommends upgrade to CRT-D.   Permanent atrial fibrillation ([place])    DCCV 07/09/13-converted, lasted two days, then back into afib--needs lifetime anticoagulation (Xarelto as of 09/2014)   Prostate cancer screening 09/2017   done by urol annually (normal prostate exam documented + PSA 0.84 as of 10/01/17 urol f/u.  10/2018 urol f/u PSA 0.6, no prostate nodule.  Rectus diastasis    Right ankle sprain 08/2017   w/distal fibula avulsion fx noted on u/s but not plain film-(Dr. Hudnall).   Skin cancer of arm, left    "burned it off" (01/04/2017)   TIA (transient ischemic attack)    L face and L arm weakness. Peri procedural->a. 03/22/2018 following cath. CT head neg. No MRI b/c has pacer. Likely due to embolus to distal branch of RMCA   Type II diabetes mellitus ([place])      Past Surgical History:  Procedure Laterality Date   ABI's Bilateral 05/21/2018   normal   BACK SURGERY     BIV ICD INSERTION CRT-D N/A 01/04/2017    Procedure: BiV ICD ;  Surgeon: Constance Haw, MD;  Location: [facility];  Service: Cardiovascular;  Laterality: N/A;   CARDIAC CATHETERIZATION N/A 06/14/2016   Minimal nonobstructive dz, EF 25-35%.  Procedure: Left Heart Cath and Coronary Angiography;  Surgeon: Peter M Martinique, MD;  Location: [facility];  Service: Cardiovascular;  Laterality: N/A;   CARDIOVASCULAR STRESS TEST  2012   2012 nuclear perfusion study: low risk scan; 04/2016 normal myocardial perfusion imaging, EF 32%.   CARDIOVERSION  07/09/2012   Procedure: CARDIOVERSION;  Surgeon: [name], MD;  Location: [facility];  Service: Cardiovascular;  Laterality: N/A;   CATARACT EXTRACTION W/ INTRAOCULAR LENS IMPLANT & ANTERIOR VITRECTOMY, BILATERAL Bilateral    CIRCUMCISION N/A 03/23/2020   Procedure: CIRCUMCISION ADULT;  Surgeon: Remi Haggard, MD;  Location: [facility];  Service: Urology;  Laterality: N/A;   COLONOSCOPY W/ POLYPECTOMY  approx 2006; repeated 09/2011   Polyps on 2013 EGD as well, repeat 12/2014   COLONOSCOPY WITH PROPOFOL N/A 07/08/2021   adenoma x 1. Procedure: COLONOSCOPY WITH PROPOFOL;  Surgeon: Carol Ada, MD;  Location: [facility];  Service: Endoscopy;  Laterality: N/A;   ESOPHAGOGASTRODUODENOSCOPY  10/18/2006   Done due to chronic GERD: Normal, bx showed no barrett's esophagus (Dr. [name])   [place] Left 10/02/2016   Procedure: LEFT RING FINGER WOUND EXPORATION AND FLEXOR TENDON REPAIR AND NERVE REPAIR;  Surgeon: Milly Jakob, MD;  Location: [facility];  Service: Orthopedics;  Laterality: Left;   INSERT / REPLACE / REMOVE PACEMAKER  02/05/2012   dual chamber, sinus node dysfunction, sinus arrest, PAF, Medtronic Revo serial#-[id number] H: last checked 05/2015   LUMBAR LAMINECTOMY Left 1976   L4-5   PACEMAKER REMOVAL  01/04/2017   PERMANENT PACEMAKER INSERTION N/A 02/05/2012   Procedure: PERMANENT PACEMAKER INSERTION;  Surgeon: [name], MD; Generator Medtronic Gordon  model [place] serial number [id number] H Laterality: N/A;   POLYPECTOMY  07/08/2021   Procedure: POLYPECTOMY;  Surgeon: Carol Ada, MD;  Location: [facility];  Service: Endoscopy;;   RETINAL DETACHMENT SURGERY Left ~ [place] Left 2018   Left shoulder reverse [name] in [place]).   RIGHT/LEFT HEART CATH AND CORONARY ANGIOGRAPHY N/A 03/22/2018   EF 30-35%, no CAD.  Procedure: RIGHT/LEFT HEART CATH AND CORONARY ANGIOGRAPHY;  Surgeon: Jolaine Artist, MD;  Location: [facility];  Service: Cardiovascular;  Laterality: N/A;   TRANSTHORACIC ECHOCARDIOGRAM  08/25/10; 05/2012; 03/23/16;12/2017   mild asymmetric LVH, normal systolic function, normal diastolic fxn, mild-to-mod mitral regurg, mild aortic valve sclerosis and trace AI, mild aortic root dilatation. 2014 f/u showed EF 40-45%, mod LAE, A FIB.  02/2016 EF 40%, diffuse hypokinesis, grade 2 DD. 12/2017 EF 35-40%,diffuse hypokin,grd III DD, mild MR   WISDOM TOOTH EXTRACTION  06/27/2021  EF 50-55%, mild aortic root/ascending aorta dilatation (41-42 mm).    Current Outpatient Medications  Medication Instructions   acetaminophen (TYLENOL) 650 mg, Oral, Every 6 hours PRN   BD INSULIN SYRINGE U/F 31G X 5/16" 1 ML MISC USE DAILY AS DIRECTED   COMBIGAN 0.2-0.5 % ophthalmic solution 1 drop, Both Eyes, Daily at bedtime   Continuous Blood Gluc Receiver (FREESTYLE LIBRE 2 READER) DEVI 1 each, Does not apply, Daily   Continuous Blood Gluc Sensor (FREESTYLE LIBRE 2 SENSOR) MISC 1 each, Does not apply, Every 14 days   diclofenac Sodium (VOLTAREN) 4 g, Topical, 4 times daily   empagliflozin (JARDIANCE) 10 mg, Oral, Daily   ENTRESTO 97-103 MG TAKE 1 TABLET TWO TIMES A DAY   eplerenone (INSPRA) 25 mg, Oral, Daily   furosemide (LASIX) 20 mg, Oral, Daily PRN   gabapentin (NEURONTIN) 300 MG capsule TAKE 2 CAPSULES TWICE A DAY   insulin aspart (NOVOLOG FLEXPEN) 100 UNIT/ML FlexPen INJECT 6-8 UNITS UNDER THE SKIN  AT THE TIME OF EACH MEAL   ketorolac (ACULAR) 0.5 % ophthalmic solution No dose, route, or frequency recorded.   LANTUS 100 UNIT/ML injection INJECT 40 TO 45 UNITS TOTAL UNDER THE SKIN DAILY   latanoprost (XALATAN) 0.005 % ophthalmic solution 1 drop, Both Eyes, Daily at bedtime   lidocaine (LIDODERM) 5 % PLACE 1 PATCH ON THE SKIN DAILY. REMOVE AND DISCARD PATCH WITHIN 12 HOURS OR AS DIRECTED BY DOCTOR   meclizine (ANTIVERT) 25 MG tablet TAKE 1 TABLET THREE TIMES A DAY AS NEEDED FOR DIZZINESS.   metoprolol succinate (TOPROL-XL) 50 MG 24 hr tablet TAKE 1 TABLET DAILY. TAKE WITH OR IMMEDIATELY FOLLOWING A MEAL Strength: 50 mg   omeprazole (PRILOSEC) 40 mg, Oral, Daily   oxybutynin (DITROPAN XL) 10 mg, Oral, Daily at bedtime   oxyCODONE (OXY IR/ROXICODONE) 5 MG immediate release tablet 1 tab po bid prn pain   rivaroxaban (XARELTO) 20 mg, Oral, Daily with supper   rOPINIRole (REQUIP) 1 mg, Oral, Daily   rosuvastatin (CRESTOR) 20 mg, Oral, Daily   sertraline (ZOLOFT) 100 mg, Oral, Daily   SURE COMFORT PEN NEEDLES 31G X 5 MM MISC USE TO INJECT INSULIN UNDER THE SKIN   Trulicity 3 mg, Subcutaneous, Weekly    No Known Allergies    HPI Patient is in today for an CT surgical consultation due to a recently diagnosed ascending thoracic aortic aneurysm finding of 4.3 cm in May of this year.  A repeat study done on today's date as described below and is noted to be stable in size.  The patient has recently had some significant difficulty with back pain that has been very limiting to his routine function.  He is being evaluated by Dr. Anitra Lauth for this.  He does describe occasional chest pain but is somewhat vague in nature.  He has a significant cardiac history in terms of rhythm issues with a implantable ICD/pacemaker as well as chronic atrial fibrillation.  He did undergo cardiac catheterization 2019 which revealed minimal coronary artery disease with a 20% lesion in the RCA.  Echocardiogram shows a  trileaflet aortic valve with improvement in cardiac function over the past few years.  Left ventricular ejection fraction in the study done in 2022 was 50 to 55%.  He did have severely dilated right and left atrium.  He sees Dr. Orene Desanctis cardiology.  He denies any lower extremity edema.  He denies any palpitations.  He gets short of breath with exertion.  He does have hypertension  which appears to be well-controlled currently.  In general he does have a fairly poor understanding of his previous medical history and chronic conditions.  He was unaware that he has even had previous cardiac catheterizations.  ROS: Please see HPI, also describes peripheral neuropathy, GERD pulm significant musculoskeletal discomforts, chronic fatigue     Objective:    BP 122/85 (BP Location: Right Arm, Patient Position: Sitting)   Pulse 83   Resp 20   Ht _0  (1.803 m)   Wt 228 lb (103.4 kg)   SpO2 96% Comment: RA  BMI 31.80 kg/m  BP Readings from Last 3 Encounters:  06/21/22 122/85  06/14/22 127/80  06/01/22 101/70   Wt Readings from Last 3 Encounters:  06/21/22 228 lb (103.4 kg)  06/14/22 230 lb 6.4 oz (104.5 kg)  06/01/22 234 lb 12.8 oz (106.5 kg)      Physical Exam Constitutional:      General: He is not in acute distress.    Appearance: He is ill-appearing.  HENT:     Head: Normocephalic and atraumatic.     Mouth/Throat:     Mouth: Mucous membranes are moist.     Pharynx: No oropharyngeal exudate or posterior oropharyngeal erythema.  Eyes:     General: No scleral icterus.    Extraocular Movements: Extraocular movements intact.     Pupils: Pupils are equal, round, and reactive to light.  Neck:     Vascular: No carotid bruit.  Cardiovascular:     Rate and Rhythm: Normal rate and regular rhythm.     Heart sounds: No murmur heard. Pulmonary:     Effort: Pulmonary effort is normal.     Breath sounds: Normal breath sounds. No rales.  Abdominal:     General: Abdomen is flat.      Palpations: Abdomen is soft.     Tenderness: There is no abdominal tenderness.  Musculoskeletal:     Right lower leg: No edema.     Left lower leg: No edema.  Lymphadenopathy:     Cervical: No cervical adenopathy.  Skin:    Coloration: Skin is pale. Skin is not jaundiced.  Neurological:     General: No focal deficit present.     Mental Status: He is alert.     Motor: Weakness present.  Psychiatric:     Comments: Memory appears fair     Narrative & Impression  CLINICAL DATA:  Ascending thoracic aneurysm.   EXAM: CT CHEST WITHOUT CONTRAST   TECHNIQUE: Multidetector CT imaging of the chest was performed following the standard protocol without IV contrast.   RADIATION DOSE REDUCTION: This exam was performed according to the departmental dose-optimization program which includes automated exposure control, adjustment of the mA and/or kV according to patient size and/or use of iterative reconstruction technique.   COMPARISON:  CT scan 11/25/2021   FINDINGS: Cardiovascular: Borderline cardiac enlargement. The pacer wires are stable. Stable mild fusiform aneurysmal dilatation of the ascending thoracic aorta measuring a maximum of 4.2 cm. Scattered atherosclerotic calcifications are stable. Stable coronary artery calcifications.   Mediastinum/Nodes: No mediastinal or hilar mass or lymphadenopathy. Small scattered lymph nodes are stable. Stable calcified lymph nodes also. The esophagus is grossly normal.   Lungs/Pleura: Patchy tree-in-bud type changes most notably in the right upper lobe. Findings typically seen with chronic inflammation or atypical infection such as MAC. Recommend correlation with any respiratory symptoms. Some of this was present on the prior CT scan from May. No focal infiltrates or worrisome pulmonary lesions.  No pleural effusions.   Upper Abdomen: No significant upper abdominal findings.   Musculoskeletal: No significant bony findings.    IMPRESSION: 1. Stable mild fusiform aneurysmal dilatation of the ascending thoracic aorta measuring a maximum of 4.2 cm. 2. Stable coronary artery calcifications. 3. Patchy tree-in-bud type changes most notably in the right upper lobe. Findings typically seen with chronic inflammation or atypical infection such as MAC. Recommend correlation with any respiratory symptoms. 4. No focal infiltrates or worrisome pulmonary lesions. 5. No mediastinal or hilar mass or adenopathy. 6. Aortic atherosclerosis.   Aortic Atherosclerosis (ICD10-I70.0).     Electronically Signed   By: Marijo Sanes M.D.   On: 06/21/2022 13:37            Assessment & Plan:   Problem List Items Addressed This Visit   None Visit Diagnoses     Aneurysm of ascending aorta without rupture ([place])    -  Primary       4.2 cm thoracic aortic aneurysm.  Due to his multiple medical comorbidities and chronic difficulties with back pain and deconditioning it is doubtful that he would ever be a surgical candidate for such an extensive procedure should it be warranted due to the size of aneurysm.  Hopefully he would never get to that point and he does want to follow it.  I encouraged him to continue with a healthy lifestyle and take a good control of his blood pressure.  It appears that it is currently well-controlled.  We will schedule repeat scan in 6 months.  He does not appear to have any current pulmonary infections from a clinical perspective.    No orders of the defined types were placed in this encounter.   No follow-ups on file.  John Giovanni, PA-C

## 2022-06-21 NOTE — Patient Instructions (Signed)
Continue healthy lifestyle choices in terms of diet and exercise.  Controlled blood pressure working closely with your doctors.

## 2022-06-23 ENCOUNTER — Encounter: Payer: Self-pay | Admitting: Family Medicine

## 2022-06-23 NOTE — Telephone Encounter (Signed)
Please advise on message below.

## 2022-06-26 ENCOUNTER — Encounter (INDEPENDENT_AMBULATORY_CARE_PROVIDER_SITE_OTHER): Payer: Self-pay

## 2022-06-26 ENCOUNTER — Encounter (INDEPENDENT_AMBULATORY_CARE_PROVIDER_SITE_OTHER): Payer: Medicare Other | Admitting: Ophthalmology

## 2022-06-26 DIAGNOSIS — H401131 Primary open-angle glaucoma, bilateral, mild stage: Secondary | ICD-10-CM | POA: Diagnosis not present

## 2022-06-26 DIAGNOSIS — H353132 Nonexudative age-related macular degeneration, bilateral, intermediate dry stage: Secondary | ICD-10-CM | POA: Diagnosis not present

## 2022-06-26 DIAGNOSIS — H2012 Chronic iridocyclitis, left eye: Secondary | ICD-10-CM | POA: Diagnosis not present

## 2022-06-26 DIAGNOSIS — H35372 Puckering of macula, left eye: Secondary | ICD-10-CM | POA: Diagnosis not present

## 2022-06-26 DIAGNOSIS — T8529XD Other mechanical complication of intraocular lens, subsequent encounter: Secondary | ICD-10-CM | POA: Diagnosis not present

## 2022-06-26 DIAGNOSIS — H43811 Vitreous degeneration, right eye: Secondary | ICD-10-CM | POA: Diagnosis not present

## 2022-06-26 DIAGNOSIS — H20042 Secondary noninfectious iridocyclitis, left eye: Secondary | ICD-10-CM | POA: Diagnosis not present

## 2022-06-26 NOTE — Telephone Encounter (Signed)
Britt, maybe we can ask Sherri to help with this??

## 2022-06-27 ENCOUNTER — Telehealth: Payer: Self-pay | Admitting: Family Medicine

## 2022-06-27 NOTE — Telephone Encounter (Signed)
Pt advised to check in as directed. MRI department does not have cancellation list unfortunately and another location may be booked out just as far. He requested to speak with Dr.McGowen.

## 2022-06-27 NOTE — Telephone Encounter (Signed)
Patient declined offer to schedule an appointment for pain follow up. He reports that he is in pain and needs to have an MRI which he is scheduled for on the 28th. He is asking to speak to Dr. Anitra Lauth and once again he declined scheduling an appointment. He was told by the referral coordinator to call every couple days to check in. Please advise patient.

## 2022-06-28 ENCOUNTER — Other Ambulatory Visit: Payer: Self-pay | Admitting: Family Medicine

## 2022-06-28 DIAGNOSIS — M5414 Radiculopathy, thoracic region: Secondary | ICD-10-CM

## 2022-06-28 DIAGNOSIS — M546 Pain in thoracic spine: Secondary | ICD-10-CM

## 2022-06-28 MED ORDER — OXYCODONE-ACETAMINOPHEN 10-325 MG PO TABS: ORAL_TABLET | ORAL | 0 refills | Status: DC

## 2022-06-28 NOTE — Telephone Encounter (Signed)
Pls call pt and offer my apologies that I cannot call him today b/c I am sick. I have just ordered a neurosurgery referral and marked it urgent to see what can get arranged, possibly prior to his MRI appt but unfortunately I have no say in when these things are arranged. I will be glad to try a stronger pain medication ('5mg'$  oxycodone did not help him but we could try '10mg'$  tabs).  He stated last visit that gabapentin did not help any--pls confirm this. Last resort would be to go to the emergency department but there is no guarantee that they would do an MRI b/c usually this is not considered an imaging test that is done urgently.

## 2022-06-28 NOTE — Telephone Encounter (Signed)
Pt advised prescription was sent.

## 2022-06-28 NOTE — Telephone Encounter (Signed)
Pt advised of recommendations. He would like to try Oxycodone '10mg'$ . He is taking the '5mg'$  dose which helps him sleep at night but less effective when trying to do daily activities. He confirmed Gabapentin was not helpful. He would like to thank you for sending the referral as urgent.

## 2022-06-28 NOTE — Telephone Encounter (Signed)
OK, I sent oxycodone/acetaminophen 10-325 to his local pharmacy. thx

## 2022-06-29 NOTE — Telephone Encounter (Signed)
Noted  

## 2022-07-06 DIAGNOSIS — Z6832 Body mass index (BMI) 32.0-32.9, adult: Secondary | ICD-10-CM | POA: Diagnosis not present

## 2022-07-06 DIAGNOSIS — M5414 Radiculopathy, thoracic region: Secondary | ICD-10-CM | POA: Diagnosis not present

## 2022-07-10 ENCOUNTER — Other Ambulatory Visit: Payer: Self-pay | Admitting: Neurosurgery

## 2022-07-10 DIAGNOSIS — M5410 Radiculopathy, site unspecified: Secondary | ICD-10-CM

## 2022-07-19 NOTE — Progress Notes (Signed)
This encounter was created in error - please disregard.

## 2022-07-20 ENCOUNTER — Encounter (HOSPITAL_COMMUNITY): Payer: Self-pay

## 2022-07-20 ENCOUNTER — Ambulatory Visit (HOSPITAL_COMMUNITY)
Admission: RE | Admit: 2022-07-20 | Discharge: 2022-07-20 | Disposition: A | Discharge status: Home / Self Care | Payer: Medicare Other | Source: Ambulatory Visit | Attending: Family Medicine | Admitting: Family Medicine

## 2022-07-20 NOTE — Progress Notes (Signed)
Called and let the patient know not to come for MRI. Per our cardiology team, patient has an abandoned lead and is not safe to have MRI.

## 2022-07-21 ENCOUNTER — Telehealth: Payer: Self-pay

## 2022-07-21 NOTE — Telephone Encounter (Signed)
Please advise.  Note: Miralax 1 capful?

## 2022-07-21 NOTE — Telephone Encounter (Signed)
Mason [name], wife of patient (DPR) called about Mason [name].  She states that he has not had a bowel movement in 4 days.  She gave him an enema about 15 minutes ago.  Is there something else he can take OTC or is there something that can be prescribed?  Please call Mason [name] [phone]

## 2022-07-25 ENCOUNTER — Encounter: Payer: Self-pay | Admitting: Cardiovascular Disease

## 2022-07-25 NOTE — Telephone Encounter (Signed)
LVM for pt to CB regarding medication.  ?

## 2022-07-25 NOTE — Telephone Encounter (Signed)
MiraLAX is great.  1-2 capfuls in 8 ounces of water.  Anywhere from 1 to several times a day is fine.  What ever it takes to start getting regular bowel movements.

## 2022-07-26 ENCOUNTER — Telehealth: Payer: Self-pay | Admitting: Family Medicine

## 2022-07-26 ENCOUNTER — Ambulatory Visit (INDEPENDENT_AMBULATORY_CARE_PROVIDER_SITE_OTHER): Payer: Medicare Other

## 2022-07-26 DIAGNOSIS — I442 Atrioventricular block, complete: Secondary | ICD-10-CM | POA: Diagnosis not present

## 2022-07-26 LAB — CUP PACEART REMOTE DEVICE CHECK
Battery Remaining Longevity: 21 mo
Battery Voltage: 2.92 V
Brady Statistic AP VP Percent: 0 %
Brady Statistic AP VS Percent: 0 %
Brady Statistic AS VP Percent: 0 %
Brady Statistic AS VS Percent: 0 %
Brady Statistic RA Percent Paced: 0 %
Brady Statistic RV Percent Paced: 99.97 %
Date Time Interrogation Session: 20240103085003
HighPow Impedance: 79 Ohm
Implantable Lead Connection Status: 753985
Implantable Lead Connection Status: 753985
Implantable Lead Connection Status: 753985
Implantable Lead Implant Date: 20130715
Implantable Lead Implant Date: 20180614
Implantable Lead Implant Date: 20180614
Implantable Lead Location: 753858
Implantable Lead Location: 753859
Implantable Lead Location: 753860
Implantable Lead Model: 4598
Implantable Pulse Generator Implant Date: 20180614
Lead Channel Impedance Value: 201.488
Lead Channel Impedance Value: 201.488
Lead Channel Impedance Value: 223.82 Ohm
Lead Channel Impedance Value: 257.018
Lead Channel Impedance Value: 257.018
Lead Channel Impedance Value: 342 Ohm
Lead Channel Impedance Value: 361 Ohm
Lead Channel Impedance Value: 418 Ohm
Lead Channel Impedance Value: 456 Ohm
Lead Channel Impedance Value: 456 Ohm
Lead Channel Impedance Value: 532 Ohm
Lead Channel Impedance Value: 589 Ohm
Lead Channel Impedance Value: 608 Ohm
Lead Channel Impedance Value: 703 Ohm
Lead Channel Impedance Value: 760 Ohm
Lead Channel Impedance Value: 893 Ohm
Lead Channel Impedance Value: 931 Ohm
Lead Channel Impedance Value: 950 Ohm
Lead Channel Pacing Threshold Amplitude: 0.375 V
Lead Channel Pacing Threshold Amplitude: 0.5 V
Lead Channel Pacing Threshold Pulse Width: 0.4 ms
Lead Channel Pacing Threshold Pulse Width: 1 ms
Lead Channel Sensing Intrinsic Amplitude: 0.625 mV
Lead Channel Sensing Intrinsic Amplitude: 12.625 mV
Lead Channel Sensing Intrinsic Amplitude: 12.625 mV
Lead Channel Setting Pacing Amplitude: 1 V
Lead Channel Setting Pacing Amplitude: 2 V
Lead Channel Setting Pacing Pulse Width: 0.4 ms
Lead Channel Setting Pacing Pulse Width: 1 ms
Lead Channel Setting Sensing Sensitivity: 0.3 mV
Zone Setting Status: 755011

## 2022-07-26 NOTE — Telephone Encounter (Signed)
Spoke with patient regarding results/recommendations.  

## 2022-07-26 NOTE — Telephone Encounter (Signed)
Pt states he received a missed call. He had no idea who it was from and when asked did they leave a message or if he was expecting a call he  replied no. This is to document him returning the call to whoever may have called Mr. Pitstick.

## 2022-07-26 NOTE — Telephone Encounter (Signed)
LM for pt to return call to discuss.  

## 2022-08-16 ENCOUNTER — Other Ambulatory Visit: Payer: Medicare Other

## 2022-08-17 ENCOUNTER — Ambulatory Visit
Admission: RE | Admit: 2022-08-17 | Discharge: 2022-08-17 | Disposition: A | Discharge status: Home / Self Care | Payer: Medicare Other | Source: Ambulatory Visit | Attending: Neurosurgery | Admitting: Neurosurgery

## 2022-08-17 DIAGNOSIS — M48061 Spinal stenosis, lumbar region without neurogenic claudication: Secondary | ICD-10-CM | POA: Diagnosis not present

## 2022-08-17 DIAGNOSIS — M5410 Radiculopathy, site unspecified: Secondary | ICD-10-CM

## 2022-08-17 DIAGNOSIS — M5414 Radiculopathy, thoracic region: Secondary | ICD-10-CM | POA: Diagnosis not present

## 2022-08-17 DIAGNOSIS — M2578 Osteophyte, vertebrae: Secondary | ICD-10-CM | POA: Diagnosis not present

## 2022-08-17 DIAGNOSIS — M5124 Other intervertebral disc displacement, thoracic region: Secondary | ICD-10-CM | POA: Diagnosis not present

## 2022-08-17 DIAGNOSIS — M5126 Other intervertebral disc displacement, lumbar region: Secondary | ICD-10-CM | POA: Diagnosis not present

## 2022-08-17 MED ORDER — DIAZEPAM 5 MG PO TABS: 5.0000 mg | ORAL_TABLET | Freq: Once | ORAL | Status: DC

## 2022-08-17 MED ORDER — IOPAMIDOL (ISOVUE-M 300) INJECTION 61%
10.0000 mL | Freq: Once | INTRAMUSCULAR | Status: AC
  Administered 2022-08-17: 10 mL via INTRATHECAL

## 2022-08-17 MED ORDER — ONDANSETRON HCL 4 MG/2ML IJ SOLN: 4.0000 mg | Freq: Once | INTRAMUSCULAR | Status: DC | PRN

## 2022-08-17 MED ORDER — MEPERIDINE HCL 50 MG/ML IJ SOLN: 50.0000 mg | Freq: Once | INTRAMUSCULAR | Status: DC | PRN

## 2022-08-17 NOTE — Discharge Instructions (Signed)

## 2022-08-21 NOTE — Progress Notes (Signed)
Remote ICD transmission.   

## 2022-08-22 ENCOUNTER — Telehealth: Payer: Self-pay | Admitting: Family Medicine

## 2022-08-22 ENCOUNTER — Other Ambulatory Visit: Payer: Self-pay

## 2022-08-22 MED ORDER — SERTRALINE HCL 100 MG PO TABS: 100.0000 mg | ORAL_TABLET | Freq: Every day | ORAL | 1 refills | Status: DC

## 2022-08-22 NOTE — Telephone Encounter (Signed)
Patient is needing refill on Sertraline. Pharmacy is confirmed and correct as [facility] in [place]

## 2022-08-22 NOTE — Telephone Encounter (Signed)
Rx sent to pharmacy   

## 2022-08-23 DIAGNOSIS — M5414 Radiculopathy, thoracic region: Secondary | ICD-10-CM | POA: Diagnosis not present

## 2022-08-23 DIAGNOSIS — Z6832 Body mass index (BMI) 32.0-32.9, adult: Secondary | ICD-10-CM | POA: Diagnosis not present

## 2022-08-29 ENCOUNTER — Other Ambulatory Visit: Payer: Self-pay

## 2022-08-29 MED ORDER — ROSUVASTATIN CALCIUM 20 MG PO TABS: 20.0000 mg | ORAL_TABLET | Freq: Every day | ORAL | 0 refills | Status: DC

## 2022-08-29 MED ORDER — OXYBUTYNIN CHLORIDE ER 10 MG PO TB24: 10.0000 mg | ORAL_TABLET | Freq: Every day | ORAL | 0 refills | Status: DC

## 2022-08-31 ENCOUNTER — Ambulatory Visit: Payer: Medicare Other

## 2022-09-01 ENCOUNTER — Ambulatory Visit (INDEPENDENT_AMBULATORY_CARE_PROVIDER_SITE_OTHER): Payer: Medicare Other

## 2022-09-01 DIAGNOSIS — Z0000 Encounter for general adult medical examination without abnormal findings: Secondary | ICD-10-CM | POA: Diagnosis not present

## 2022-09-01 NOTE — Patient Instructions (Signed)
  Mason [name] , Thank you for taking time to come for your Medicare Wellness Visit. I appreciate your ongoing commitment to your health goals. Please review the following plan we discussed and let me know if I can assist you in the future.   These are the goals we discussed:  Goals      Patient Stated     Lose weight      Weight (lb) < 210 lb (95.3 kg)     Lose weight by eating less.        This is a list of the screening recommended for you and due dates:  Health Maintenance  Topic Date Due   Yearly kidney health urinalysis for diabetes  11/19/2020   Complete foot exam   06/22/2021   DTaP/Tdap/Td vaccine (2 - Td or Tdap) 08/29/2021   Hemoglobin A1C  11/29/2022   Yearly kidney function blood test for diabetes  12/07/2022   Eye exam for diabetics  04/06/2023   Medicare Annual Wellness Visit  09/02/2023   Pneumonia Vaccine  Completed   Flu Shot  Completed   COVID-19 Vaccine  Completed   Zoster (Shingles) Vaccine  Completed   HPV Vaccine  Aged Out

## 2022-09-01 NOTE — Progress Notes (Signed)
Subjective:   Mason [name] is a 82 y.o. male who presents for Medicare Annual/Subsequent preventive examination. I connected with  Mason [name] on 09/01/22 by a audio enabled telemedicine application and verified that I am speaking with the correct person using two identifiers.  Patient Location: Home  Provider Location: Office/Clinic  I discussed the limitations of evaluation and management by telemedicine. The patient expressed understanding and agreed to proceed.   Review of Systems    Defer to PCP Cardiac Risk Factors include: advanced age (>81mn, >>51women);diabetes mellitus;dyslipidemia;family history of premature cardiovascular disease;hypertension;male gender;obesity (BMI >30kg/m2)     Objective:    There were no vitals filed for this visit. There is no height or weight on file to calculate BMI.     09/01/2022    8:44 AM 08/31/2021    9:30 AM 07/08/2021   10:03 AM 12/12/2020    2:04 PM 09/03/2020    9:25 AM 08/25/2020    9:41 AM 03/19/2020    1:18 PM  Advanced Directives  Does Patient Have a Medical Advance Directive? Yes No No Yes Yes No No  Type of [place]        Does patient want to make changes to medical advance directive? No - Patient declined     Yes (MAU/Ambulatory/Procedural Areas - Information given)   Copy of [place] Chart? No - copy requested        Would patient like information on creating a medical advance directive?  No - Patient declined No - Patient declined    No - Patient declined    Current Medications (verified) Outpatient Encounter Medications as of 09/01/2022  Medication Sig   acetaminophen (TYLENOL) 325 MG tablet Take 650 mg by mouth every 6 (six) hours as needed for moderate pain.   BD INSULIN SYRINGE U/F 31G X 5/16" 1 ML MISC USE DAILY AS DIRECTED   COMBIGAN 0.2-0.5 % ophthalmic solution Place 1 drop into both eyes at bedtime.    Continuous Blood Gluc Receiver  (FREESTYLE LIBRE 2 READER) DEVI 1 each by Does not apply route daily.   Continuous Blood Gluc Sensor (FREESTYLE LIBRE 2 SENSOR) MISC 1 each by Does not apply route every 14 (fourteen) days.   diclofenac Sodium (VOLTAREN) 1 % GEL Apply 4 g topically 4 (four) times daily.   Dulaglutide (TRULICITY) 3 M0000000SOPN Inject 3 mg into the skin once a week.   empagliflozin (JARDIANCE) 10 MG TABS tablet Take 1 tablet (10 mg total) by mouth daily.   ENTRESTO 97-103 MG TAKE 1 TABLET TWO TIMES A DAY   eplerenone (INSPRA) 25 MG tablet TAKE 1 TABLET DAILY   furosemide (LASIX) 20 MG tablet Take 1 tablet (20 mg total) by mouth daily as needed for edema (For a weight gain of 3 pounds overnight or 5 pounds in one week). (Patient not taking: Reported on 11/07/2021)   gabapentin (NEURONTIN) 300 MG capsule TAKE 2 CAPSULES TWICE A DAY   insulin aspart (NOVOLOG FLEXPEN) 100 UNIT/ML FlexPen INJECT 6-8 UNITS UNDER THE SKIN AT THE TIME OF EACH MEAL (Patient taking differently: Pt stated 20 units in the a.m only   UNDER THE SKIN AT THE TIME OF EACH MEAL)   ketorolac (ACULAR) 0.5 % ophthalmic solution    LANTUS 100 UNIT/ML injection INJECT 40 TO 45 UNITS TOTAL UNDER THE SKIN DAILY   latanoprost (XALATAN) 0.005 % ophthalmic solution Place 1 drop into both eyes at bedtime.  lidocaine (LIDODERM) 5 % PLACE 1 PATCH ON THE SKIN DAILY. REMOVE AND DISCARD PATCH WITHIN 12 HOURS OR AS DIRECTED BY DOCTOR   meclizine (ANTIVERT) 25 MG tablet TAKE 1 TABLET THREE TIMES A DAY AS NEEDED FOR DIZZINESS.   metoprolol succinate (TOPROL-XL) 50 MG 24 hr tablet TAKE 1 TABLET DAILY. TAKE WITH OR IMMEDIATELY FOLLOWING A MEAL Strength: 50 mg   omeprazole (PRILOSEC) 40 MG capsule Take 1 capsule (40 mg total) by mouth daily.   oxybutynin (DITROPAN XL) 10 MG 24 hr tablet Take 1 tablet (10 mg total) by mouth at bedtime.   oxyCODONE-acetaminophen (PERCOCET) 10-325 MG tablet 1 tab po tid prn pain   rivaroxaban (XARELTO) 20 MG TABS tablet Take 1 tablet (20  mg total) by mouth daily with supper.   rOPINIRole (REQUIP) 1 MG tablet Take 1 tablet (1 mg total) by mouth daily.   rosuvastatin (CRESTOR) 20 MG tablet Take 1 tablet (20 mg total) by mouth daily.   sertraline (ZOLOFT) 100 MG tablet Take 1 tablet (100 mg total) by mouth daily.   SURE COMFORT PEN NEEDLES 31G X 5 MM MISC USE TO INJECT INSULIN UNDER THE SKIN   No facility-administered encounter medications on file as of 09/01/2022.    Allergies (verified) Patient has no known allergies.   History: Past Medical History:  Diagnosis Date   AICD (automatic cardioverter/defibrillator) present 2018   MDT CRT-D.  Fatigue-->completely pacer dependent.  Pacer settings adjusted 12/2017 to allow more chronotrophc variance with ADL's//exertion.   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   02/2016 EF 40%, diffuse hypokinesis, grade 2 DD.  Myoc perf imaging showed EF 32% 04/2016.  Pt upgraded to CRT-D 01/04/17.   Chronic renal insufficiency, stage III (moderate) (HCC) 2015   CrCl about 60 ml/min   Complete heart block (HCC)    Has dual chamber pacer.   COVID-19 virus infection 01/05/2021   paxlovid   Depression    DOE (dyspnea on exertion)    NYHA class II/III CHF   Dyspnea 2021   with exertion, bending over   Episodic low back pain 01/22/2013   w/intermittent radiculitis (12/2014 his neurologist referred him to pain mgmt for epidural steroid injection)   Erectile dysfunction 2019   due to zoloft--urol rx'd viagra   GERD (gastroesophageal reflux disease)    H/O tilt table evaluation 123456   negative   Helicobacter pylori gastritis 01/2016   History of adenomatous polyp of colon 10/12/2011    Dr. [name] (3 right side of colon- tubular adenomas removed)   History of cardiovascular stress test 05/28/2012   no ischemia, EF 37%, imaging results are unchanged and within normal variance   History of chronic prostatitis    History of kidney stones    History of vertigo    + Hx of posterior HA's.  Neuro (Dr. Erling Cruz) eval 2011.  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   Hyperlipidemia    Hypertension    Lumbar spondylosis    lumbosacral radiculopathy at L4 by EMG testing, right foot drop (neurologist is Dr. Linus Salmons with [facility] in [place])--neurologist referred him to neurosurgery  Migraine    "used to have them all the time; none for years" (01/04/2017)   Myocardial infarction [facility]) ?1970s   not entirely certain of this   Nephrolithiasis 07/2012   Left UVJ 2 mm stone with dilation of renal collecting system and slight hydroureter on right   Neuropathy    NICM (nonischemic cardiomyopathy) ([place])    a. 02/2018 Cath: LM nl, LAD min irregs, LCX no, RCA 20d. [id number]. Fick CO/CI 4.4/2.0.   Osteoarthritis, multiple sites    Shoulders, back, knees   Pacemaker 02/05/2012   dual chamber, complete heart block, meddtronic revo, lasted checked 12/2015.  Since no CAD on cath 05/2016, cards recommends upgrade to CRT-D.   Permanent atrial fibrillation ([place])    DCCV 07/09/13-converted, lasted two days, then back into afib--needs lifetime anticoagulation (Xarelto as of 09/2014)   Prostate cancer screening 09/2017   done by urol annually (normal prostate exam documented + PSA 0.84 as of 10/01/17 urol f/u.  10/2018 urol f/u PSA 0.6, no prostate nodule.   Rectus diastasis    Right ankle sprain 08/2017   w/distal fibula avulsion fx noted on u/s but not plain film-(Dr. Hudnall).   Skin cancer of arm, left    "burned it off" (01/04/2017)   TIA (transient ischemic attack)    L face and L arm weakness. Peri procedural->a.  03/22/2018 following cath. CT head neg. No MRI b/c has pacer. Likely due to embolus to distal branch of RMCA   Type II diabetes mellitus ([place])    Past Surgical History:  Procedure Laterality Date   ABI's Bilateral 05/21/2018   normal   BACK SURGERY     BIV ICD INSERTION CRT-D N/A 01/04/2017   Procedure: BiV ICD ;  Surgeon: Constance Haw, MD;  Location: [place] [facility];  Service: Cardiovascular;  Laterality: N/A;   CARDIAC CATHETERIZATION N/A 06/14/2016   Minimal nonobstructive dz, EF 25-35%.  Procedure: Left Heart Cath and Coronary Angiography;  Surgeon: Peter M Martinique, MD;  Location: [place] [facility];  Service: Cardiovascular;  Laterality: N/A;   CARDIOVASCULAR STRESS TEST  2012   2012 nuclear perfusion study: low risk scan; 04/2016 normal myocardial perfusion imaging, EF 32%.   CARDIOVERSION  07/09/2012   Procedure: CARDIOVERSION;  Surgeon: [name], MD;  Location: [facility] [facility];  Service: Cardiovascular;  Laterality: N/A;   CATARACT EXTRACTION W/ INTRAOCULAR LENS IMPLANT & ANTERIOR VITRECTOMY, BILATERAL Bilateral    CIRCUMCISION N/A 03/23/2020   Procedure: CIRCUMCISION ADULT;  Surgeon: Remi Haggard, MD;  Location: [facility];  Service: Urology;  Laterality: N/A;   COLONOSCOPY W/ POLYPECTOMY  approx 2006; repeated 09/2011   Polyps on 2013 EGD as well, repeat 12/2014   COLONOSCOPY WITH PROPOFOL N/A 07/08/2021   adenoma x 1. Procedure: COLONOSCOPY WITH PROPOFOL;  Surgeon: Carol Ada, MD;  Location: [facility];  Service: Endoscopy;  Laterality: N/A;   ESOPHAGOGASTRODUODENOSCOPY  10/18/2006   Done due to chronic GERD: Normal, bx showed no barrett's esophagus (Dr. [name])   [place] Left 10/02/2016   Procedure: LEFT RING FINGER WOUND EXPORATION AND FLEXOR TENDON REPAIR AND NERVE REPAIR;  Surgeon: Milly Jakob, MD;  Location: [facility];  Service: Orthopedics;  Laterality: Left;   INSERT / REPLACE / REMOVE PACEMAKER  02/05/2012   dual chamber, sinus node  dysfunction, sinus arrest, PAF, Medtronic Revo serial#-[id number] H: last checked 05/2015   LUMBAR LAMINECTOMY Left 1976   L4-5   PACEMAKER REMOVAL  01/04/2017   PERMANENT PACEMAKER INSERTION N/A 02/05/2012  Procedure: PERMANENT PACEMAKER INSERTION;  Surgeon: [name], MD; Generator Medtronic [place] model [place] serial number [id number] Laterality: N/A;   POLYPECTOMY  07/08/2021   Procedure: POLYPECTOMY;  Surgeon: Carol Ada, MD;  [facility];  Service: Endoscopy;;   RETINAL DETACHMENT SURGERY Left ~ [place] Left 2018   Left shoulder reverse [name] in [place]).   RIGHT/LEFT HEART CATH AND CORONARY ANGIOGRAPHY N/A 03/22/2018   EF 30-35%, no CAD.  Procedure: RIGHT/LEFT HEART CATH AND CORONARY ANGIOGRAPHY;  Surgeon: Jolaine Artist, MD;  Location: [place] [facility];  Service: Cardiovascular;  Laterality: N/A;   TRANSTHORACIC ECHOCARDIOGRAM  08/25/10; 05/2012; 03/23/16;12/2017   mild asymmetric LVH, normal systolic function, normal diastolic fxn, mild-to-mod mitral regurg, mild aortic valve sclerosis and trace AI, mild aortic root dilatation. 2014 f/u showed EF 40-45%, mod LAE, A FIB.  02/2016 EF 40%, diffuse hypokinesis, grade 2 DD. 12/2017 EF 35-40%,diffuse hypokin,grd III DD, mild MR   WISDOM TOOTH EXTRACTION  06/27/2021   EF 50-55%, mild aortic root/ascending aorta dilatation (41-42 mm).   Family History  Problem Relation Age of Onset   Heart failure Mother    Stroke Mother    Stroke Father    Heart disease Sister    Heart disease Sister    Cancer Sister        liver   Cancer Brother        lung   Cancer Brother        lung   Heart disease Brother    Social History   Socioeconomic History   Marital status: Married    Spouse name: Not on file   Number of children: Not on file   Years of education: Not on file   Highest education level: 12th grade  Occupational History   Not on file  Tobacco Use   Smoking status:  Never   Smokeless tobacco: Never  Vaping Use   Vaping Use: Never used  Substance and Sexual Activity   Alcohol use: Yes    Comment: occ   Drug use: No   Sexual activity: Not on file  Other Topics Concern   Not on file  Social History Narrative   Not on file   Social Determinants of Health   Financial Resource Strain: Low Risk  (09/01/2022)   Overall Financial Resource Strain (CARDIA)    Difficulty of Paying Living Expenses: Not hard at all  Food Insecurity: No Food Insecurity (09/01/2022)   Hunger Vital Sign    Worried About Running Out of Food in the Last Year: Never true    Ran Out of Food in the Last Year: Never true  Transportation Needs: No Transportation Needs (09/01/2022)   PRAPARE - Hydrologist (Medical): No    Lack of Transportation (Non-Medical): No  Physical Activity: Inactive (09/01/2022)   Exercise Vital Sign    Days of Exercise per Week: 0 days    Minutes of Exercise per Session: 0 min  Stress: No Stress Concern Present (09/01/2022)   [place]    Feeling of Stress : Only a little  Social Connections: Socially Integrated (09/01/2022)   Social Connection and Isolation Panel [NHANES]    Frequency of Communication with Friends and Family: More than three times a week    Frequency of Social Gatherings with Friends and Family: More than three times a week    Attends Religious Services: More than  4 times per year    Active Member of Clubs or Organizations: Yes    Attends Archivist Meetings: 1 to 4 times per year    Marital Status: Married    Tobacco Counseling Counseling given: Not Answered   Clinical Intake:  Pre-visit preparation completed: No  Pain : No/denies pain     Diabetes: Yes CBG done?: No Did pt. bring in CBG monitor from home?: No  How often do you need to have someone help you when you read instructions, pamphlets, or other written materials  from your doctor or pharmacy?: 1 - Never  Diabetic?Yes   Interpreter Needed?: No  Information entered by :: Toria C., RMA   Activities of Daily Living    09/01/2022    8:41 AM  In your present state of health, do you have any difficulty performing the following activities:  Hearing? 0  Vision? 1  Difficulty concentrating or making decisions? 0  Walking or climbing stairs? 0  Dressing or bathing? 0  Doing errands, shopping? 0  Preparing Food and eating ? N  Using the Toilet? N  In the past six months, have you accidently leaked urine? Y  Do you have problems with loss of bowel control? N  Managing your Medications? N  Managing your Finances? N  Housekeeping or managing your Housekeeping? N    Patient Care Team: Tammi Sou, MD as PCP - General (Family Medicine) Constance Haw, MD as PCP - Electrophysiology (Cardiology) Croitoru, Dani Gobble, MD as PCP - Cardiology (Cardiology) Carol Ada, MD as Consulting Physician (Gastroenterology) Kathie Rhodes, MD (Inactive) as Consulting Physician (Urology) Troy Sine, MD as Consulting Physician (Cardiology) Audery Amel, MD as Referring Physician (Neurology) Clent Jacks, MD as Consulting Physician (Ophthalmology) Lance Muss, DO as Consulting Physician (Orthopedic Surgery) Aleda Grana (Dentistry) Renard Hamper Franchot Gallo, PA-C as Consulting Physician (Physician Assistant) Earnie Larsson, MD as Consulting Physician (Neurosurgery)  Indicate any recent Medical Services you may have received from other than [facility] providers in the past year (date may be approximate).     Assessment:   This is a routine wellness examination for Mason [name].  Hearing/Vision screen No results found.  Dietary issues and exercise activities discussed:     Goals Addressed   None   Depression Screen    09/01/2022    8:48 AM 05/10/2022    1:29 PM 11/07/2021    3:59 PM 10/10/2021   10:26 AM 08/31/2021    9:28 AM 06/24/2021   10:45 AM  06/09/2021    3:59 PM  PHQ 2/9 Scores  PHQ - 2 Score 0 0 2 0 1 3 0  PHQ- 9 Score      12     Fall Risk    09/01/2022    8:47 AM 05/10/2022    1:29 PM 12/06/2021    8:45 AM 11/07/2021    3:59 PM 10/10/2021   10:26 AM  Fall Risk   Falls in the past year? 1 1 1 1 1  $ Number falls in past yr: 0 0 0 0 0  Injury with Fall? 0 0 1 0 0  Risk for fall due to :  No Fall Risks     Follow up  Falls evaluation completed  Falls evaluation completed     FALL RISK PREVENTION PERTAINING TO THE HOME:  Any stairs in or around the home? No  If so, are there any without handrails? No  Home free of loose throw rugs in walkways,  pet beds, electrical cords, etc? Yes  Adequate lighting in your home to reduce risk of falls? Yes   ASSISTIVE DEVICES UTILIZED TO PREVENT FALLS:  Life alert? No  Use of a cane, walker or w/c? Yes  Grab bars in the bathroom? Yes  Shower chair or bench in shower? No  Elevated toilet seat or a handicapped toilet? No   TIMED UP AND GO:  Was the test performed? No .   Cognitive Function:    07/30/2018   10:50 AM  MMSE - Mini Mental State Exam  Orientation to time 5  Orientation to Place 5  Registration 3  Attention/ Calculation 3  Recall 2  Language- name 2 objects 2  Language- repeat 1  Language- follow 3 step command 3  Language- read & follow direction 1  Write a sentence 1  Copy design 1  Total score 27        09/01/2022    8:47 AM 08/31/2021    9:36 AM  6CIT Screen  What Year? 0 points 0 points  What month? 0 points 0 points  What time? 0 points 0 points  Count back from 20 0 points 0 points  Months in reverse 0 points 0 points  Repeat phrase 0 points 2 points  Total Score 0 points 2 points    Immunizations Immunization History  Administered Date(s) Administered   Covid-19, Mrna,Vaccine(Spikevax)65yr and older 08/08/2022   Fluad Quad(high Dose 65+) 05/19/2019, 05/10/2022   Influenza, High Dose Seasonal PF 05/26/2013, 05/20/2015, 06/26/2016,  05/11/2017, 04/24/2018, 05/22/2020   Influenza,inj,Quad PF,6+ Mos 06/15/2014   Influenza-Unspecified 05/25/2019   Moderna Covid-19 Vaccine Bivalent Booster 154yr& up 06/30/2021   Moderna Sars-Covid-2 Vaccination 08/26/2019, 09/16/2019, 06/23/2020   Pneumococcal Conjugate-13 10/15/2014   Pneumococcal Polysaccharide-23 07/24/2010, 10/12/2020   Tdap 08/30/2011   Zoster Recombinat (Shingrix) 10/12/2020, 01/02/2022    TDAP status: Due, Education has been provided regarding the importance of this vaccine. Advised may receive this vaccine at local pharmacy or Health Dept. Aware to provide a copy of the vaccination record if obtained from local pharmacy or Health Dept. Verbalized acceptance and understanding.  Flu Vaccine status: Up to date  Pneumococcal vaccine status: Up to date  Covid-19 vaccine status: Information provided on how to obtain vaccines.   Qualifies for Shingles Vaccine? Yes   Zostavax completed No   Shingrix Completed?: Yes  Screening Tests Health Maintenance  Topic Date Due   Diabetic kidney evaluation - Urine ACR  11/19/2020   FOOT EXAM  06/22/2021   DTaP/Tdap/Td (2 - Td or Tdap) 08/29/2021   HEMOGLOBIN A1C  11/29/2022   Diabetic kidney evaluation - eGFR measurement  12/07/2022   OPHTHALMOLOGY EXAM  04/06/2023   Medicare Annual Wellness (AWV)  09/02/2023   Pneumonia Vaccine 6587Years old  Completed   INFLUENZA VACCINE  Completed   COVID-19 Vaccine  Completed   Zoster Vaccines- Shingrix  Completed   HPV VACCINES  Aged Out    Health Maintenance  Health Maintenance Due  Topic Date Due   Diabetic kidney evaluation - Urine ACR  11/19/2020   FOOT EXAM  06/22/2021   DTaP/Tdap/Td (2 - Td or Tdap) 08/29/2021    Colorectal cancer screening: No longer required.   Lung Cancer Screening: (Low Dose CT Chest recommended if Age 80-80ears, 30 pack-year currently smoking OR have quit w/in 15years.) does not qualify.    Additional Screening:  Hepatitis C Screening:  does not qualify;   Vision Screening: Recommended annual ophthalmology exams for  early detection of glaucoma and other disorders of the eye. Is the patient up to date with their annual eye exam?  Yes  Who is the provider or what is the name of the office in which the patient attends annual eye exams? Clent Jacks, MD   Dental Screening: Recommended annual dental exams for proper oral hygiene  Community Resource Referral / Chronic Care Management: CRR required this visit?  No   CCM required this visit?  No      Plan:     I have personally reviewed and noted the following in the patient's chart:   Medical and social history Use of alcohol, tobacco or illicit drugs  Current medications and supplements including opioid prescriptions. Patient is not currently taking opioid prescriptions. Functional ability and status Nutritional status Physical activity Advanced directives List of other physicians Hospitalizations, surgeries, and ER visits in previous 12 months Vitals Screenings to include cognitive, depression, and falls Referrals and appointments  In addition, I have reviewed and discussed with patient certain preventive protocols, quality metrics, and best practice recommendations. A written personalized care plan for preventive services as well as general preventive health recommendations were provided to patient.     Kavin Leech, [place]   09/01/2022   Nurse Notes: Non face to face 20 minutes.  Mr. Brackens , Thank you for taking time to come for your Medicare Wellness Visit. I appreciate your ongoing commitment to your health goals. Please review the following plan we discussed and let me know if I can assist you in the future.   These are the goals we discussed:  Goals      Patient Stated     Lose weight      Weight (lb) < 210 lb (95.3 kg)     Lose weight by eating less.        This is a list of the screening recommended for you and due dates:  Health Maintenance   Topic Date Due   Yearly kidney health urinalysis for diabetes  11/19/2020   Complete foot exam   06/22/2021   DTaP/Tdap/Td vaccine (2 - Td or Tdap) 08/29/2021   Hemoglobin A1C  11/29/2022   Yearly kidney function blood test for diabetes  12/07/2022   Eye exam for diabetics  04/06/2023   Medicare Annual Wellness Visit  09/02/2023   Pneumonia Vaccine  Completed   Flu Shot  Completed   COVID-19 Vaccine  Completed   Zoster (Shingles) Vaccine  Completed   HPV Vaccine  Aged Out

## 2022-09-04 ENCOUNTER — Other Ambulatory Visit: Payer: Self-pay | Admitting: Cardiovascular Disease

## 2022-09-04 ENCOUNTER — Ambulatory Visit: Payer: Medicare Other | Admitting: Family Medicine

## 2022-09-05 ENCOUNTER — Telehealth: Payer: Self-pay | Admitting: Family Medicine

## 2022-09-05 MED ORDER — ROPINIROLE HCL 1 MG PO TABS: 1.0000 mg | ORAL_TABLET | Freq: Every day | ORAL | 0 refills | Status: DC

## 2022-09-05 MED ORDER — ROSUVASTATIN CALCIUM 20 MG PO TABS: 20.0000 mg | ORAL_TABLET | Freq: Every day | ORAL | 0 refills | Status: DC

## 2022-09-05 MED ORDER — SERTRALINE HCL 100 MG PO TABS: 100.0000 mg | ORAL_TABLET | Freq: Every day | ORAL | 0 refills | Status: DC

## 2022-09-05 NOTE — Telephone Encounter (Signed)
Refills completed but patient would need to keep scheduled follow up appointment.

## 2022-09-05 NOTE — Telephone Encounter (Signed)
[facility] called and would like to have the medications faxed to them at [phone]. Phone number is [phone]. The patient is needing three different medications filled  sertraline (ZOLOFT) 100 MG table   rosuvastatin (CRESTOR) 20 MG tablet   rOPINIRole (REQUIP) 1 MG tablet   Please call the number listed above if have any questions.

## 2022-09-11 ENCOUNTER — Ambulatory Visit (INDEPENDENT_AMBULATORY_CARE_PROVIDER_SITE_OTHER): Payer: Medicare Other | Admitting: Family Medicine

## 2022-09-11 ENCOUNTER — Encounter: Payer: Self-pay | Admitting: Family Medicine

## 2022-09-11 VITALS — BP 92/63 | HR 81 | Temp 97.9°F | Ht 71.0 in | Wt 230.0 lb

## 2022-09-11 DIAGNOSIS — N1831 Chronic kidney disease, stage 3a: Secondary | ICD-10-CM

## 2022-09-11 DIAGNOSIS — E7800 Pure hypercholesterolemia, unspecified: Secondary | ICD-10-CM | POA: Diagnosis not present

## 2022-09-11 DIAGNOSIS — I10 Essential (primary) hypertension: Secondary | ICD-10-CM

## 2022-09-11 LAB — LIPID PANEL
Cholesterol: 132 mg/dL (ref 0–200)
HDL: 45.8 mg/dL (ref >39.00)
LDL Cholesterol: 54 mg/dL (ref 0–99)
NonHDL: 86.03
Total CHOL/HDL Ratio: 3
Triglycerides: 161 mg/dL — ABNORMAL HIGH (ref 0.0–149.0)
VLDL: 32.2 mg/dL (ref 0.0–40.0)

## 2022-09-11 LAB — COMPREHENSIVE METABOLIC PANEL
ALT: 18 U/L (ref 0–53)
AST: 19 U/L (ref 0–37)
Albumin: 4.3 g/dL (ref 3.5–5.2)
Alkaline Phosphatase: 89 U/L (ref 39–117)
BUN: 19 mg/dL (ref 6–23)
CO2: 30 mEq/L (ref 19–32)
Calcium: 9.9 mg/dL (ref 8.4–10.5)
Chloride: 101 mEq/L (ref 96–112)
Creatinine, Ser: 1.4 mg/dL (ref 0.40–1.50)
GFR: 47.59 mL/min — ABNORMAL LOW (ref >60.00)
Glucose, Bld: 172 mg/dL — ABNORMAL HIGH (ref 70–99)
Potassium: 4.3 mEq/L (ref 3.5–5.1)
Sodium: 139 mEq/L (ref 135–145)
Total Bilirubin: 0.6 mg/dL (ref 0.2–1.2)
Total Protein: 7.6 g/dL (ref 6.0–8.3)

## 2022-09-11 MED ORDER — OMEPRAZOLE 40 MG PO CPDR: 40.0000 mg | DELAYED_RELEASE_CAPSULE | Freq: Every day | ORAL | 1 refills | Status: DC

## 2022-09-11 MED ORDER — GABAPENTIN 300 MG PO CAPS: 600.0000 mg | ORAL_CAPSULE | Freq: Two times a day (BID) | ORAL | 1 refills | Status: DC

## 2022-09-11 MED ORDER — METOPROLOL SUCCINATE ER 50 MG PO TB24: ORAL_TABLET | ORAL | 1 refills | Status: DC

## 2022-09-11 MED ORDER — OXYBUTYNIN CHLORIDE ER 10 MG PO TB24: 10.0000 mg | ORAL_TABLET | Freq: Every day | ORAL | 1 refills | Status: DC

## 2022-09-11 NOTE — Progress Notes (Unsigned)
OFFICE VISIT  09/11/2022  CC:  Chief Complaint  Patient presents with   Medical Management of Chronic Issues   Patient is a 82 y.o. Mason [name] who presents for follow-up hypertension, chronic renal insufficiency, hypercholesterolemia, and thoracic radiculopathy.  INTERIM HX: Overall Mason Mason [name] feels like he is doing pretty well. His back is just hurting him a mild amount, on the right side at mid thoracic level--as per his usual. He had follow-up for his back pain with Dr. Annette [name] on 08/23/2022: Patient reports he does have thoracic degenerative disc disease but Dr. Trenton [name] recommended no procedure/surgery. Mason Mason [name] is not taking any oxycodone.  No home blood pressure monitoring.  ROS as above, plus--> no fevers, no CP, no SOB, no wheezing, no cough, no dizziness, no HAs, no rashes, no melena/hematochezia.  No polyuria or polydipsia.  No myalgias or arthralgias.  No focal weakness, paresthesias, or tremors.  No acute vision or hearing abnormalities.  No dysuria or unusual/new urinary urgency or frequency.  No recent changes in lower legs. No n/v/d or abd pain.  No palpitations.    Past Medical History:  Diagnosis Date   AICD (automatic cardioverter/defibrillator) present 2018   MDT CRT-D.  Fatigue-->completely pacer dependent.  Pacer settings adjusted 12/2017 to allow more chronotrophc variance with ADL's//exertion.   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   02/2016 EF 40%, diffuse hypokinesis, grade 2 DD.  Myoc perf imaging showed EF 32% 04/2016.  Pt upgraded to CRT-D 01/04/17.   Chronic renal insufficiency, stage III (moderate) (HCC) 2015   CrCl about 60 ml/min   Complete heart block (HCC)    Has dual  chamber pacer.   COVID-19 virus infection 01/05/2021   paxlovid   Depression    DOE (dyspnea on exertion)    NYHA class II/III CHF   Dyspnea 2021   with exertion, bending over   Episodic low back pain 01/22/2013   w/intermittent radiculitis (12/2014 his neurologist referred him to pain mgmt for epidural steroid injection)   Erectile dysfunction 2019   due to zoloft--urol rx'd viagra   GERD (gastroesophageal reflux disease)    H/O tilt table evaluation [id number]   negative   Helicobacter pylori gastritis 01/2016   History of adenomatous polyp of colon 10/12/2011   Dr. Benson [name] (3 right side of colon- tubular adenomas removed)   History of cardiovascular stress test 05/28/2012   no ischemia, EF 37%, imaging results are unchanged and within normal variance   History of chronic prostatitis    History of kidney stones    History of vertigo    + Hx of posterior HA's.  Neuro (Dr. Erling [name]) eval 2011.  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   Hyperlipidemia    Hypertension    Lumbar spondylosis    lumbosacral radiculopathy at L4 by EMG testing, right foot drop (neurologist is Dr. Linus [name] with [facility] in [place])--neurologist referred him to neurosurgery   Migraine    "used to have them all the time; none for years" (01/04/2017)   Myocardial infarction [facility]) ?1970s   not entirely certain of this   Nephrolithiasis 07/2012   Left UVJ 2 mm stone  with dilation of renal collecting system and slight hydroureter on right   Neuropathy    NICM (nonischemic cardiomyopathy) ([place])    a. 02/2018 Cath: LM nl, LAD min irregs, LCX no, RCA 20d. [place]:[id number]. Fick CO/CI 4.4/2.0.   Osteoarthritis, multiple sites    Shoulders, back, knees   Pacemaker 02/05/2012   dual chamber, complete heart block, meddtronic revo, lasted checked 12/2015.  Since no CAD on cath 05/2016, cards recommends upgrade to CRT-D.   Permanent atrial  fibrillation ([place])    DCCV 07/09/13-converted, lasted two days, then back into afib--needs lifetime anticoagulation (Xarelto as of 09/2014)   Prostate cancer screening 09/2017   done by urol annually (normal prostate exam documented + PSA 0.84 as of 10/01/17 urol f/u.  10/2018 urol f/u PSA 0.6, no prostate nodule.   Rectus diastasis    Right ankle sprain 08/2017   w/distal fibula avulsion fx noted on u/s but not plain film-(Dr. Hudnall).   Skin cancer of arm, left    "burned it off" (01/04/2017)   TIA (transient ischemic attack)    L face and L arm weakness. Peri procedural->a. 03/22/2018 following cath. CT head neg. No MRI b/c has pacer. Likely due to embolus to distal branch of RMCA   Type II diabetes mellitus ([place])     Past Surgical History:  Procedure Laterality Date   ABI's Bilateral 05/21/2018   normal   BACK SURGERY     BIV ICD INSERTION CRT-D N/A 01/04/2017   Procedure: BiV ICD ;  Surgeon: Constance Haw, MD;  Location: [facility];  Service: Cardiovascular;  Laterality: N/A;   CARDIAC CATHETERIZATION N/A 06/14/2016   Minimal nonobstructive dz, EF 25-35%.  Procedure: Left Heart Cath and Coronary Angiography;  Surgeon: Peter M Martinique, MD;  Location: [place];  Service: Cardiovascular;  Laterality: N/A;   CARDIOVASCULAR STRESS TEST  2012   2012 nuclear perfusion study: low risk scan; 04/2016 normal myocardial perfusion imaging, EF 32%.   CARDIOVERSION  07/09/2012   Procedure: CARDIOVERSION;  Surgeon: [name], MD;  Location: [facility];  Service: Cardiovascular;  Laterality: N/A;   CATARACT EXTRACTION W/ INTRAOCULAR LENS IMPLANT & ANTERIOR VITRECTOMY, BILATERAL Bilateral    CIRCUMCISION N/A 03/23/2020   Procedure: CIRCUMCISION ADULT;  Surgeon: Remi Haggard, MD;  Location: [facility];  Service: Urology;  Laterality: N/A;   COLONOSCOPY W/ POLYPECTOMY  approx 2006; repeated 09/2011   Polyps on 2013 EGD as well, repeat 12/2014   COLONOSCOPY WITH PROPOFOL N/A  07/08/2021   adenoma x 1. Procedure: COLONOSCOPY WITH PROPOFOL;  Surgeon: Carol Ada, MD;  Location: [facility];  Service: Endoscopy;  Laterality: N/A;   ESOPHAGOGASTRODUODENOSCOPY  10/18/2006   Done due to chronic GERD: Normal, bx showed no barrett's esophagus (Dr. Benson [name])   [place] Left 10/02/2016   Procedure: LEFT RING FINGER WOUND EXPORATION AND FLEXOR TENDON REPAIR AND NERVE REPAIR;  Surgeon: Milly Jakob, MD;  Location: [facility] [place];  Service: Orthopedics;  Laterality: Left;   INSERT / REPLACE / REMOVE PACEMAKER  02/05/2012   dual chamber, sinus node dysfunction, sinus arrest, PAF, Medtronic Revo serial#[id number] H: last checked 05/2015   LUMBAR LAMINECTOMY Left 1976   L4-5   PACEMAKER REMOVAL  01/04/2017   PERMANENT PACEMAKER INSERTION N/A 02/05/2012   Procedure: PERMANENT PACEMAKER INSERTION;  Surgeon: [name], MD; Generator Medtronic [place] model [place] serial number [id number] H Laterality: N/A;   POLYPECTOMY  07/08/2021   Procedure: POLYPECTOMY;  Surgeon: Carol Ada, MD;  Location: [facility];  Service: Endoscopy;;   RETINAL DETACHMENT SURGERY Left ~ [place] Left 2018   Left shoulder reverse TSA ([facility] in [place]).   RIGHT/LEFT HEART CATH AND CORONARY ANGIOGRAPHY N/A 03/22/2018   EF 30-35%, no CAD.  Procedure: RIGHT/LEFT HEART CATH AND CORONARY ANGIOGRAPHY;  Surgeon: Jolaine Artist, MD;  Location: [place] [facility];  Service: Cardiovascular;  Laterality: N/A;   TRANSTHORACIC ECHOCARDIOGRAM  08/25/10; 05/2012; 03/23/16;12/2017   mild asymmetric LVH, normal systolic function, normal diastolic fxn, mild-to-mod mitral regurg, mild aortic valve sclerosis and trace AI, mild aortic root dilatation. 2014 f/u showed EF 40-45%, mod LAE, A FIB.  02/2016 EF 40%, diffuse hypokinesis, grade 2 DD. 12/2017 EF 35-40%,diffuse hypokin,grd III DD, mild MR   WISDOM TOOTH EXTRACTION  06/27/2021   EF 50-55%, mild aortic root/ascending aorta  dilatation (41-42 mm).    Outpatient Medications Prior to Visit  Medication Sig Dispense Refill   acetaminophen (TYLENOL) 325 MG tablet Take 650 mg by mouth every 6 (six) hours as needed for moderate pain.     BD INSULIN SYRINGE U/F 31G X 5/16" 1 ML MISC USE DAILY AS DIRECTED 100 each 3   COMBIGAN 0.2-0.5 % ophthalmic solution Place 1 drop into both eyes at bedtime.      Continuous Blood Gluc Receiver (FREESTYLE LIBRE 2 READER) DEVI 1 each by Does not apply route daily. 1 each 3   Continuous Blood Gluc Sensor (FREESTYLE LIBRE 2 SENSOR) MISC 1 each by Does not apply route every 14 (fourteen) days. 6 each 3   Dulaglutide (TRULICITY) 3 0000000 SOPN Inject 3 mg into the skin once a week. 6 mL 3   empagliflozin (JARDIANCE) 10 MG TABS tablet Take 1 tablet (10 mg total) by mouth daily. 90 tablet 3   ENTRESTO 97-103 MG TAKE 1 TABLET TWO TIMES A DAY 180 tablet 2   eplerenone (INSPRA) 25 MG tablet TAKE 1 TABLET DAILY 90 tablet 3   gabapentin (NEURONTIN) 300 MG capsule TAKE 2 CAPSULES TWICE A DAY 360 capsule 1   insulin aspart (NOVOLOG FLEXPEN) 100 UNIT/ML FlexPen INJECT 6-8 UNITS UNDER THE SKIN AT THE TIME OF EACH MEAL (Patient taking differently: Pt stated 20 units in the a.m only   UNDER THE SKIN AT THE TIME OF EACH MEAL) 15 mL 4   ketorolac (ACULAR) 0.5 % ophthalmic solution      LANTUS 100 UNIT/ML injection INJECT 40 TO 45 UNITS TOTAL UNDER THE SKIN DAILY (Patient taking differently: 42 Units daily.) 40 mL 3   lidocaine (LIDODERM) 5 % PLACE 1 PATCH ON THE SKIN DAILY. REMOVE AND DISCARD PATCH WITHIN 12 HOURS OR AS DIRECTED BY DOCTOR 90 patch 0   meclizine (ANTIVERT) 25 MG tablet TAKE 1 TABLET THREE TIMES A DAY AS NEEDED FOR DIZZINESS. 270 tablet 3   metoprolol succinate (TOPROL-XL) 50 MG 24 hr tablet TAKE 1 TABLET DAILY. TAKE WITH OR IMMEDIATELY FOLLOWING A MEAL Strength: 50 mg 90 tablet 1   omeprazole (PRILOSEC) 40 MG capsule Take 1 capsule (40 mg total) by mouth daily. 90 capsule 1   oxybutynin  (DITROPAN XL) 10 MG 24 hr tablet Take 1 tablet (10 mg total) by mouth at bedtime. 30 tablet 0   rivaroxaban (XARELTO) 20 MG TABS tablet Take 1 tablet (20 mg total) by mouth daily with supper. 90 tablet 1   ROCKLATAN 0.02-0.005 % SOLN Apply to eye.     rOPINIRole (REQUIP) 1 MG tablet Take 1 tablet (1 mg total) by  mouth daily. 90 tablet 0   rosuvastatin (CRESTOR) 20 MG tablet Take 1 tablet (20 mg total) by mouth daily. 90 tablet 0   sertraline (ZOLOFT) 100 MG tablet Take 1 tablet (100 mg total) by mouth daily. 90 tablet 0   SURE COMFORT PEN NEEDLES 31G X 5 MM MISC USE TO INJECT INSULIN UNDER THE SKIN 100 each 11   diclofenac Sodium (VOLTAREN) 1 % GEL Apply 4 g topically 4 (four) times daily. (Patient not taking: Reported on 09/11/2022) 100 g 1   oxyCODONE-acetaminophen (PERCOCET) 10-325 MG tablet 1 tab po tid prn pain (Patient not taking: Reported on 09/11/2022) 30 tablet 0   furosemide (LASIX) 20 MG tablet Take 1 tablet (20 mg total) by mouth daily as needed for edema (For a weight gain of 3 pounds overnight or 5 pounds in one week). (Patient not taking: Reported on 11/07/2021) 30 tablet 5   latanoprost (XALATAN) 0.005 % ophthalmic solution Place 1 drop into both eyes at bedtime.  (Patient not taking: Reported on 09/11/2022)  12   No facility-administered medications prior to visit.    No Known Allergies  Review of Systems As per HPI  PE:    09/11/2022   10:26 AM 08/17/2022   10:35 AM 08/17/2022    9:17 AM  Vitals with BMI  Height 5' 11"$     Weight 230 lbs    BMI [id number]    Systolic 92 123456 0000000  Diastolic 63 94 81  Pulse 81 82 84     Physical Exam  Gen: Alert, well appearing.  Patient is oriented to person, place, time, and situation. AFFECT: pleasant, lucid thought and speech. No further exam today  LABS:  Last CBC Lab Results  Component Value Date   WBC 5.6 06/09/2021   HGB 16.0 06/09/2021   HCT 49.2 06/09/2021   MCV 92.5 06/09/2021   MCH 30.3 03/19/2020   RDW 13.6 06/09/2021    PLT 129.0 (L) 0000000   Last metabolic panel Lab Results  Component Value Date   GLUCOSE 150 (H) 12/06/2021   NA 139 12/06/2021   K 4.4 12/06/2021   CL 101 12/06/2021   CO2 30 12/06/2021   BUN 20 12/06/2021   CREATININE 1.58 (H) 12/06/2021   GFRNONAA 37 (L) 03/23/2020   CALCIUM 9.2 12/06/2021   PROT 6.9 12/06/2021   ALBUMIN 4.1 12/06/2021   BILITOT 0.5 12/06/2021   ALKPHOS 72 12/06/2021   AST 18 12/06/2021   ALT 13 12/06/2021   ANIONGAP 9 03/23/2020   Last lipids Lab Results  Component Value Date   CHOL 119 12/06/2021   HDL 36.00 (L) 12/06/2021   LDLCALC 46 12/06/2021   LDLDIRECT 103.0 06/22/2020   TRIG 182.0 (H) 12/06/2021   CHOLHDL 3 12/06/2021   Last hemoglobin A1c Lab Results  Component Value Date   HGBA1C 6.6 (A) 05/31/2022   Last thyroid functions Lab Results  Component Value Date   TSH 2.963 04/04/2018   IMPRESSION AND PLAN:  #1 hypertension.  On current meds his blood pressure actually runs on the low to low normal side.  He is asymptomatic. Continue Toprol-XL 50 mg a day, He is also on Entresto and eplerenone for CHF. Check electrolytes and creatinine today.  2.  Hyperlipidemia, doing well on Crestor 20 mg a day. Lipid panel and hepatic panel today.  3.  Chronic renal insufficiency stage III. Avoiding NSAIDs. Electrolytes and creatinine checked today.  4.  Right thoracic radiculopathy pain.  This is much milder lately. No  pain medicines at this time. He is established with Dr. Trenton [name].  An After Visit Summary was printed and given to the patient.  FOLLOW UP: 3 mo  Signed:  Crissie Sickles, MD           09/11/2022

## 2022-09-12 ENCOUNTER — Other Ambulatory Visit: Payer: Self-pay

## 2022-09-12 DIAGNOSIS — E1165 Type 2 diabetes mellitus with hyperglycemia: Secondary | ICD-10-CM

## 2022-09-12 MED ORDER — TRULICITY 3 MG/0.5ML ~~LOC~~ SOAJ: 3.0000 mg | SUBCUTANEOUS | 3 refills | Status: DC

## 2022-09-13 ENCOUNTER — Other Ambulatory Visit: Payer: Self-pay | Admitting: Family Medicine

## 2022-09-14 ENCOUNTER — Other Ambulatory Visit: Payer: Self-pay

## 2022-09-14 DIAGNOSIS — E1159 Type 2 diabetes mellitus with other circulatory complications: Secondary | ICD-10-CM

## 2022-09-14 MED ORDER — FREESTYLE LIBRE 2 SENSOR MISC: 1.0000 | 3 refills | Status: DC

## 2022-09-14 NOTE — Telephone Encounter (Signed)
Pt called requesting a refill for sensors. Rx sent.

## 2022-09-21 DIAGNOSIS — H401123 Primary open-angle glaucoma, left eye, severe stage: Secondary | ICD-10-CM | POA: Diagnosis not present

## 2022-09-29 ENCOUNTER — Encounter: Payer: Self-pay | Admitting: Internal Medicine

## 2022-09-29 ENCOUNTER — Ambulatory Visit (INDEPENDENT_AMBULATORY_CARE_PROVIDER_SITE_OTHER): Payer: Medicare Other | Admitting: Internal Medicine

## 2022-09-29 VITALS — BP 122/80 | HR 107 | Ht 71.0 in | Wt 229.0 lb

## 2022-09-29 DIAGNOSIS — E785 Hyperlipidemia, unspecified: Secondary | ICD-10-CM

## 2022-09-29 DIAGNOSIS — E1159 Type 2 diabetes mellitus with other circulatory complications: Secondary | ICD-10-CM | POA: Diagnosis not present

## 2022-09-29 DIAGNOSIS — E1165 Type 2 diabetes mellitus with hyperglycemia: Secondary | ICD-10-CM

## 2022-09-29 LAB — POCT GLYCOSYLATED HEMOGLOBIN (HGB A1C): Hemoglobin A1C: 7.1 % — AB (ref 4.0–5.6)

## 2022-09-29 NOTE — Progress Notes (Signed)
Patient ID: Mason [name], male   DOB: 1941-04-02, 82 y.o.   MRN: [id number]   HPI: Mason [name] is a 82 y.o.-year-old male, initially referred by his PCP, Dr. Anitra Lauth, returning for follow-up for DM2, dx in ~2011, insulin-dependent since 2019, uncontrolled, with long-term complications (CAD with ?h/o AMI, nonischemic CMP, CHF, Afib, cerebrovascular disease with h/o TIA, CKD stage III, peripheral neuropathy, ED).  Last visit 4 months ago.  Interim history: No increased urination, nausea, chest pain.  He has blurry vision. He also has back pain.    Reviewed HbA1c levels:  Lab Results  Component Value Date   HGBA1C 6.6 (A) 05/31/2022   HGBA1C 7.3 (A) 01/26/2022   HGBA1C 7.2 (A) 09/08/2021   HGBA1C 6.7 (A) 12/09/2020   HGBA1C 7.6 (A) 09/09/2020   HGBA1C 9.7 (H) 06/22/2020   HGBA1C 9.0 (H) 03/23/2020   HGBA1C 9.2 (H) 03/16/2020   HGBA1C 9.9 (H) 11/20/2019   HGBA1C 9.7 (H) 05/27/2019   HGBA1C 9.0 (H) 02/26/2019   HGBA1C 10.1 (H) 11/29/2018   HGBA1C 9.0 (A) 08/29/2018   HGBA1C 9.0 08/29/2018   HGBA1C 9.0 (A) 08/29/2018   HGBA1C 9.0 (A) 08/29/2018   HGBA1C 7.8 (A) 05/14/2018   HGBA1C 8.1 (H) 02/06/2018   HGBA1C 8.0 (H) 10/26/2017   HGBA1C 8.2 (H) 07/27/2017   HGBA1C 7.0 03/01/2017   HGBA1C 7.1 11/16/2016   HGBA1C 7.8 (H) 07/03/2016   HGBA1C 6.8 03/14/2016   HGBA1C 7.0 (H) 12/09/2015   HGBA1C 7.4 (H) 09/20/2015   HGBA1C 7.2 (H) 03/25/2015   HGBA1C 7.5 (H) 10/15/2014   HGBA1C 7.1 (H) 06/15/2014   HGBA1C 6.9 (H) 02/12/2014  04/11/2021: HbA1c 7.1%  He is on: - Jardiance 10 mg before breakfast - Trulicity 1.5 >> 3  mg weekly - NovoLog pens :  14-18 >> 18-20 >> 15-16 >> 15 >> 10 >> but taking 12 units before brunch 8-10 units before dinner >> 0 units >> 7-8 >> but taking 10 units before a larger dinner  If you have a snack at night, you may need ~5 units before the snack - Lantus vial 44 >> 40 >> 36-40 >> 42 >> 45 >> 40 >> but taking 42 units at bedtime She was on metformin in the  past.  Pt checks his sugars more than 4 times a day with his freestyle libre CGM:  Previously:  Previously:  Lowest sugar was: 50s >> 48 >> 50s >> 44 >> 44; he has hypoglycemia awareness at 70.  Highest sugar was 400 >> ... 260 >> 250 >> 200s >> 309.  Glucometer: Freestyle  Pt's meals are: - Breakfast: cereals, eggs, grits, toast - Lunch: may skip or sandwich - Dinner: meat + veggies + starch - Snacks: apple/cheese >> bananasplits at night Patient saw nutrition a long time ago.  -+ Stage III CKD, last BUN/creatinine:  Lab Results  Component Value Date   BUN 19 09/11/2022   BUN 20 12/06/2021   CREATININE 1.40 09/11/2022   CREATININE 1.58 (H) 12/06/2021  04/11/2021: 31/1.86, GFR 37, glucose 133 On Entresto.  -+ HL; last set of lipids: Lab Results  Component Value Date   CHOL 132 09/11/2022   HDL 45.80 09/11/2022   LDLCALC 54 09/11/2022   LDLDIRECT 103.0 06/22/2020   TRIG 161.0 (H) 09/11/2022   CHOLHDL 3 09/11/2022  04/11/2021: 141/214/45/53 Previously on Zocor 20, but now Crestor 20 mg daily.  - last eye exam was in 03/2022: No DR, + glaucoma.  - + numbness and tingling  in his feet.  On Neurontin 300 mg twice a day.   Seeing podiatry - Dr. Ronnald Ramp at the [facility] clinic - appt pending in June.  No known FH of DM.  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 Vitamin B12 level: 04/11/2021: 429  Drinks beer 2-3x a day, but not quite every day.  ROS: + see HPI  I reviewed pt's medications, allergies, PMH, social hx, family hx, and changes were documented in the history of present illness. Otherwise, unchanged from my initial visit note.  Past Medical History:  Diagnosis Date   AICD (automatic cardioverter/defibrillator) present 2018   MDT CRT-D.  Fatigue-->completely pacer dependent.  Pacer settings adjusted 12/2017 to allow more chronotrophc variance with ADL's//exertion.   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   02/2016 EF 40%, diffuse hypokinesis, grade 2 DD.  Myoc perf imaging showed EF 32% 04/2016.  Pt upgraded to CRT-D 01/04/17.   Chronic renal insufficiency, stage III (moderate) (HCC) 2015   CrCl about 60 ml/min   Complete heart block (HCC)    Has dual chamber pacer.   COVID-19 virus infection 01/05/2021   paxlovid   Depression    DOE (dyspnea on exertion)    NYHA class II/III CHF   Dyspnea 2021   with exertion, bending over   Episodic low back pain 01/22/2013   w/intermittent radiculitis (12/2014 his neurologist referred him to pain mgmt for epidural steroid injection)   Erectile dysfunction 2019   due to zoloft--urol rx'd viagra   GERD (gastroesophageal reflux disease)    H/O tilt table evaluation [id number]   negative   Helicobacter pylori gastritis 01/2016   History of adenomatous polyp of colon 10/12/2011   Dr. [name] (3 right side of colon- tubular adenomas removed)   History of cardiovascular stress test 05/28/2012   no ischemia, EF 37%, imaging results are unchanged and within normal variance   History of chronic prostatitis    History of kidney stones    History of vertigo    + Hx of posterior HA's.  Neuro (Dr. Erling Cruz) eval 2011.  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   Hyperlipidemia    Hypertension    Lumbar spondylosis    lumbosacral radiculopathy at L4 by EMG testing, right foot drop (neurologist is Dr. Linus Salmons with [facility] in [place])--neurologist referred him to neurosurgery   Migraine    "used to have them all the time; none for years" (01/04/2017)    Myocardial infarction [facility]) ?1970s   not entirely certain of this   Nephrolithiasis 07/2012   Left UVJ 2 mm stone with dilation of renal collecting system and slight hydroureter on right   Neuropathy    NICM (nonischemic cardiomyopathy) ([place])    a. 02/2018 Cath: LM nl, LAD min irregs, LCX no, RCA 20d. [id number]. Fick CO/CI 4.4/2.0.   Osteoarthritis, multiple sites    Shoulders, back, knees   Pacemaker 02/05/2012   dual chamber, complete heart block, meddtronic revo, lasted checked 12/2015.  Since no CAD on cath 05/2016, cards recommends upgrade to CRT-D.  Permanent atrial fibrillation ([place])    DCCV 07/09/13-converted, lasted two days, then back into afib--needs lifetime anticoagulation (Xarelto as of 09/2014)   Prostate cancer screening 09/2017   done by urol annually (normal prostate exam documented + PSA 0.84 as of 10/01/17 urol f/u.  10/2018 urol f/u PSA 0.6, no prostate nodule.   Rectus diastasis    Right ankle sprain 08/2017   w/distal fibula avulsion fx noted on u/s but not plain film-(Dr. Hudnall).   Skin cancer of arm, left    "burned it off" (01/04/2017)   TIA (transient ischemic attack)    L face and L arm weakness. Peri procedural->a. 03/22/2018 following cath. CT head neg. No MRI b/c has pacer. Likely due to embolus to distal branch of RMCA   Type II diabetes mellitus ([place])    Past Surgical History:  Procedure Laterality Date   ABI's Bilateral 05/21/2018   normal   BACK SURGERY     BIV ICD INSERTION CRT-D N/A 01/04/2017   Procedure: BiV ICD ;  Surgeon: Constance Haw, MD;  Location: [facility];  Service: Cardiovascular;  Laterality: N/A;   CARDIAC CATHETERIZATION N/A 06/14/2016   Minimal nonobstructive dz, EF 25-35%.  Procedure: Left Heart Cath and Coronary Angiography;  Surgeon: Peter M Martinique, MD;  Location: [facility];  Service: Cardiovascular;  Laterality: N/A;   CARDIOVASCULAR STRESS TEST  2012   2012 nuclear perfusion study: low risk scan; 04/2016  normal myocardial perfusion imaging, EF 32%.   CARDIOVERSION  07/09/2012   Procedure: CARDIOVERSION;  Surgeon: [name], MD;  Location: [facility];  Service: Cardiovascular;  Laterality: N/A;   CATARACT EXTRACTION W/ INTRAOCULAR LENS IMPLANT & ANTERIOR VITRECTOMY, BILATERAL Bilateral    CIRCUMCISION N/A 03/23/2020   Procedure: CIRCUMCISION ADULT;  Surgeon: Remi Haggard, MD;  Location: [facility];  Service: Urology;  Laterality: N/A;   COLONOSCOPY W/ POLYPECTOMY  approx 2006; repeated 09/2011   Polyps on 2013 EGD as well, repeat 12/2014   COLONOSCOPY WITH PROPOFOL N/A 07/08/2021   adenoma x 1. Procedure: COLONOSCOPY WITH PROPOFOL;  Surgeon: Carol Ada, MD;  Location: [facility];  Service: Endoscopy;  Laterality: N/A;   ESOPHAGOGASTRODUODENOSCOPY  10/18/2006   Done due to chronic GERD: Normal, bx showed no barrett's esophagus (Dr. [name])   [place] Left 10/02/2016   Procedure: LEFT RING FINGER WOUND EXPORATION AND FLEXOR TENDON REPAIR AND NERVE REPAIR;  Surgeon: Milly Jakob, MD;  Location: [facility];  Service: Orthopedics;  Laterality: Left;   INSERT / REPLACE / REMOVE PACEMAKER  02/05/2012   dual chamber, sinus node dysfunction, sinus arrest, PAF, Medtronic Revo serial#-[id number] H: last checked 05/2015   LUMBAR LAMINECTOMY Left 1976   L4-5   PACEMAKER REMOVAL  01/04/2017   PERMANENT PACEMAKER INSERTION N/A 02/05/2012   Procedure: PERMANENT PACEMAKER INSERTION;  Surgeon: [name], MD; Generator Medtronic [place] model [place] serial number [id number] H Laterality: N/A;   POLYPECTOMY  07/08/2021   Procedure: POLYPECTOMY;  Surgeon: Carol Ada, MD;  Location: [facility];  Service: Endoscopy;;   RETINAL DETACHMENT SURGERY Left ~ [place] Left 2018   Left shoulder reverse [name] in [place]).   RIGHT/LEFT HEART CATH AND CORONARY ANGIOGRAPHY N/A 03/22/2018   EF 30-35%, no CAD.  Procedure: RIGHT/LEFT HEART CATH AND CORONARY  ANGIOGRAPHY;  Surgeon: Jolaine Artist, MD;  Location: [facility];  Service: Cardiovascular;  Laterality: N/A;   TRANSTHORACIC ECHOCARDIOGRAM  08/25/10; 05/2012; 03/23/16;12/2017   mild asymmetric  LVH, normal systolic function, normal diastolic fxn, mild-to-mod mitral regurg, mild aortic valve sclerosis and trace AI, mild aortic root dilatation. 2014 f/u showed EF 40-45%, mod LAE, A FIB.  02/2016 EF 40%, diffuse hypokinesis, grade 2 DD. 12/2017 EF 35-40%,diffuse hypokin,grd III DD, mild MR   WISDOM TOOTH EXTRACTION  06/27/2021   EF 50-55%, mild aortic root/ascending aorta dilatation (41-42 mm).   Social History   Socioeconomic History   Marital status: Married    Spouse name: Not on file   Number of children: Not on file   Years of education: Not on file   Highest education level: 12th grade  Occupational History   Not on file  Tobacco Use   Smoking status: Never   Smokeless tobacco: Never  Vaping Use   Vaping Use: Never used  Substance and Sexual Activity   Alcohol use: Yes    Comment: occ   Drug use: No   Sexual activity: Not on file  Other Topics Concern   Not on file  Social History Narrative   Not on file   Social Determinants of Health   Financial Resource Strain: Low Risk  (09/10/2022)   Overall Financial Resource Strain (CARDIA)    Difficulty of Paying Living Expenses: Not hard at all  Food Insecurity: No Food Insecurity (09/10/2022)   Hunger Vital Sign    Worried About Running Out of Food in the Last Year: Never true    University in the Last Year: Never true  Transportation Needs: No Transportation Needs (09/10/2022)   PRAPARE - Hydrologist (Medical): No    Lack of Transportation (Non-Medical): No  Physical Activity: Inactive (09/10/2022)   Exercise Vital Sign    Days of Exercise per Week: 0 days    Minutes of Exercise per Session: 0 min  Stress: Stress Concern Present (09/10/2022)   [place]    Feeling of Stress : To some extent  Social Connections: Socially Integrated (09/10/2022)   Social Connection and Isolation Panel [NHANES]    Frequency of Communication with Friends and Family: More than three times a week    Frequency of Social Gatherings with Friends and Family: More than three times a week    Attends Religious Services: More than 4 times per year    Active Member of [employer] or Organizations: Yes    Attends Music therapist: More than 4 times per year    Marital Status: Married  Human resources officer Violence: Not At Risk (09/01/2022)   Humiliation, Afraid, Rape, and Kick questionnaire    Fear of Current or Ex-Partner: No    Emotionally Abused: No    Physically Abused: No    Sexually Abused: No   Current Outpatient Medications on File Prior to Visit  Medication Sig Dispense Refill   acetaminophen (TYLENOL) 325 MG tablet Take 650 mg by mouth every 6 (six) hours as needed for moderate pain.     BD INSULIN SYRINGE U/F 31G X 5/16" 1 ML MISC USE DAILY AS DIRECTED 100 each 3   COMBIGAN 0.2-0.5 % ophthalmic solution Place 1 drop into both eyes at bedtime.      Continuous Blood Gluc Receiver (FREESTYLE LIBRE 2 READER) DEVI 1 each by Does not apply route daily. 1 each 3   Continuous Blood Gluc Sensor (FREESTYLE LIBRE 2 SENSOR) MISC 1 each by Does not apply route every 14 (fourteen) days. 6 each 3  diclofenac Sodium (VOLTAREN) 1 % GEL Apply 4 g topically 4 (four) times daily. (Patient not taking: Reported on 09/11/2022) 100 g 1   Dulaglutide (TRULICITY) 3 0000000 SOPN Inject 3 mg into the skin once a week. 6 mL 3   empagliflozin (JARDIANCE) 10 MG TABS tablet Take 1 tablet (10 mg total) by mouth daily. 90 tablet 3   ENTRESTO 97-103 MG TAKE 1 TABLET TWO TIMES A DAY 180 tablet 2   eplerenone (INSPRA) 25 MG tablet TAKE 1 TABLET DAILY 90 tablet 3   gabapentin (NEURONTIN) 300 MG capsule Take 2 capsules (600 mg total) by mouth 2 (two)  times daily. 360 capsule 1   insulin aspart (NOVOLOG FLEXPEN) 100 UNIT/ML FlexPen INJECT 6-8 UNITS UNDER THE SKIN AT THE TIME OF EACH MEAL (Patient taking differently: Pt stated 20 units in the a.m only   UNDER THE SKIN AT THE TIME OF EACH MEAL) 15 mL 4   ketorolac (ACULAR) 0.5 % ophthalmic solution      LANTUS 100 UNIT/ML injection INJECT 40 TO 45 UNITS TOTAL UNDER THE SKIN DAILY (Patient taking differently: 42 Units daily.) 40 mL 3   lidocaine (LIDODERM) 5 % PLACE 1 PATCH ON THE SKIN DAILY. REMOVE AND DISCARD PATCH WITHIN 12 HOURS OR AS DIRECTED BY DOCTOR 90 patch 0   meclizine (ANTIVERT) 25 MG tablet TAKE 1 TABLET THREE TIMES A DAY AS NEEDED FOR DIZZINESS. 270 tablet 3   metoprolol succinate (TOPROL-XL) 50 MG 24 hr tablet TAKE 1 TABLET DAILY. TAKE WITH OR IMMEDIATELY FOLLOWING A MEAL Strength: 50 mg 90 tablet 1   omeprazole (PRILOSEC) 40 MG capsule Take 1 capsule (40 mg total) by mouth daily. 90 capsule 1   oxybutynin (DITROPAN XL) 10 MG 24 hr tablet Take 1 tablet (10 mg total) by mouth at bedtime. 90 tablet 1   rivaroxaban (XARELTO) 20 MG TABS tablet Take 1 tablet (20 mg total) by mouth daily with supper. 90 tablet 1   ROCKLATAN 0.02-0.005 % SOLN Apply to eye.     rOPINIRole (REQUIP) 1 MG tablet Take 1 tablet (1 mg total) by mouth daily. 90 tablet 0   rosuvastatin (CRESTOR) 20 MG tablet Take 1 tablet (20 mg total) by mouth daily. 90 tablet 0   sertraline (ZOLOFT) 100 MG tablet Take 1 tablet (100 mg total) by mouth daily. 90 tablet 0   SURE COMFORT PEN NEEDLES 31G X 5 MM MISC USE TO INJECT INSULIN UNDER THE SKIN 100 each 11   No current facility-administered medications on file prior to visit.   No Known Allergies Family History  Problem Relation Age of Onset   Heart failure Mother    Stroke Mother    Stroke Father    Heart disease Sister    Heart disease Sister    Cancer Sister        liver   Cancer Brother        lung   Cancer Brother        lung   Heart disease Brother     PE: BP 122/80 (BP Location: Right Arm, Patient Position: Sitting, Cuff Size: Normal)   Pulse (!) 107   Ht '5\' 11"'$  (1.803 m)   Wt 229 lb (103.9 kg)   SpO2 99%   BMI 31.94 kg/m  Wt Readings from Last 3 Encounters:  09/29/22 229 lb (103.9 kg)  09/11/22 230 lb (104.3 kg)  06/21/22 228 lb (103.4 kg)   Constitutional: overweight, in NAD Eyes: EOMI, no exophthalmos ENT: no  thyromegaly, no cervical lymphadenopathy Cardiovascular: tachycardia, RR, No MRG Respiratory: CTA B Musculoskeletal: no deformities Skin: no rashes Neurological: + tremor with outstretched hands  ASSESSMENT: 1. DM2, insulin-dependent, uncontrolled, with complications: - CAD with ?h/o AMI - nonischemic CMP - CHF, s/p AICD - Afib, s/p pacemaker - cerebrovascular disease with h/o TIA - CKD stage III - peripheral neuropathy - ED  No family history of medullary thyroid cancer or personal history of pancreatitis.  2. HL  PLAN:  1. Patient with longstanding, uncontrolled, type 2 diabetes, on oral antidiabetic regimen with SGLT2 inhibitor, also weekly GLP-1 receptor agonist and basal-bolus insulin, with improving control.  At last visit, HbA1c was 6.6%, lower.  At that time we decreased the dose of Lantus and also NovoLog before brunch as he was getting low blood sugars in the afternoon.  I advised him to try not to eat after dark. CGM interpretation: -At today's visit, we reviewed his CGM downloads: It appears that 82% of values are in target range (goal >70%), while 15% are higher than 180 (goal <25%), and 3% are lower than 70 (goal <4%).  The calculated average blood sugar is 135.  The projected HbA1c for the next 3 months (GMI) is 6.5%. -Reviewing the CGM trends, sugars appear to be within the target range during the day, with occasional spikes and occasional slightly lower blood sugars than 70, however, they are increasing at night, after approximately 7 PM and then dropping slowly from 12 AM with a nadir around  7-9 AM.  At today's visit I advised him to decrease the Lantus dose but add consistent NovoLog before his dinners. - I suggested to:  Patient Instructions  Please continue: - Jardiance 10 mg before breakfast - Trulicity 3 mg weekly  Please decrease: - Lantus 36 units at bedtime  Change: - NovoLog  10 units before brunch 6-10 units before dinner If you have a snack at night, you may need ~5 units before the snack  Please return in 4 months.   - we checked his HbA1c: 7.1% (higher) - advised to check sugars at different times of the day - 4x a day, rotating check times - advised for yearly eye exams >> he is UTD.  He is seeing Dr. Zadie Rhine. - At last visits we discussed about possibly trying alpha-lipoic acid for numbness in his feet -he did not try this yet.  He was already on Neurontin and ropinirole.  He has twice a year foot exam at the [place].  I do not have these records beyond a nurse evaluation from 08/2022.  I advised him to have these records sent to me after next visit. - return to clinic in 4 months  2. HL -Reviewed latest lipid panel from last month: Fractions at goal: Lab Results  Component Value Date   CHOL 132 09/11/2022   HDL 45.80 09/11/2022   LDLCALC 54 09/11/2022   LDLDIRECT 103.0 06/22/2020   TRIG 161.0 (H) 09/11/2022   CHOLHDL 3 09/11/2022  -He continues on Crestor 20 mg daily-no side effects  Philemon Kingdom, MD PhD [facility] Endocrinology

## 2022-09-29 NOTE — Patient Instructions (Addendum)
Please continue: - Jardiance 10 mg before breakfast - Trulicity 3 mg weekly  Please decrease: - Lantus 36 units at bedtime  Change: - NovoLog  10 units before brunch 6-10 units before dinner If you have a snack at night, you may need ~5 units before the snack  Please return in 4 months.

## 2022-10-02 ENCOUNTER — Other Ambulatory Visit: Payer: Self-pay | Admitting: Cardiovascular Disease

## 2022-10-02 DIAGNOSIS — I4821 Permanent atrial fibrillation: Secondary | ICD-10-CM

## 2022-10-02 NOTE — Telephone Encounter (Signed)
Prescription refill request for Xarelto received.  Indication: a fib Last office visit: 02/13/22 Weight: 229 Age: 82 Scr: 1.4 09/11/22 epic CrCl: 62 mL/min

## 2022-10-25 ENCOUNTER — Ambulatory Visit (INDEPENDENT_AMBULATORY_CARE_PROVIDER_SITE_OTHER): Payer: Medicare Other

## 2022-10-25 DIAGNOSIS — I442 Atrioventricular block, complete: Secondary | ICD-10-CM | POA: Diagnosis not present

## 2022-10-31 LAB — CUP PACEART REMOTE DEVICE CHECK
Battery Remaining Longevity: 20 mo
Battery Voltage: 2.91 V
Brady Statistic AP VP Percent: 0 %
Brady Statistic AP VS Percent: 0 %
Brady Statistic AS VP Percent: 0 %
Brady Statistic AS VS Percent: 0 %
Brady Statistic RA Percent Paced: 0 %
Brady Statistic RV Percent Paced: 99.82 %
Date Time Interrogation Session: 20240406033325
HighPow Impedance: 74 Ohm
Implantable Lead Connection Status: 753985
Implantable Lead Connection Status: 753985
Implantable Lead Connection Status: 753985
Implantable Lead Implant Date: 20130715
Implantable Lead Implant Date: 20180614
Implantable Lead Implant Date: 20180614
Implantable Lead Location: 753858
Implantable Lead Location: 753859
Implantable Lead Location: 753860
Implantable Lead Model: 4598
Implantable Pulse Generator Implant Date: 20180614
Lead Channel Impedance Value: 193.707
Lead Channel Impedance Value: 215.064
Lead Channel Impedance Value: 218.104
Lead Channel Impedance Value: 237.686
Lead Channel Impedance Value: 270.667
Lead Channel Impedance Value: 342 Ohm
Lead Channel Impedance Value: 361 Ohm
Lead Channel Impedance Value: 418 Ohm
Lead Channel Impedance Value: 418 Ohm
Lead Channel Impedance Value: 532 Ohm
Lead Channel Impedance Value: 532 Ohm
Lead Channel Impedance Value: 551 Ohm
Lead Channel Impedance Value: 646 Ohm
Lead Channel Impedance Value: 703 Ohm
Lead Channel Impedance Value: 779 Ohm
Lead Channel Impedance Value: 874 Ohm
Lead Channel Impedance Value: 931 Ohm
Lead Channel Impedance Value: 931 Ohm
Lead Channel Pacing Threshold Amplitude: 0.5 V
Lead Channel Pacing Threshold Amplitude: 0.625 V
Lead Channel Pacing Threshold Pulse Width: 0.4 ms
Lead Channel Pacing Threshold Pulse Width: 1 ms
Lead Channel Sensing Intrinsic Amplitude: 0.625 mV
Lead Channel Sensing Intrinsic Amplitude: 12.625 mV
Lead Channel Sensing Intrinsic Amplitude: 12.625 mV
Lead Channel Setting Pacing Amplitude: 1 V
Lead Channel Setting Pacing Amplitude: 2 V
Lead Channel Setting Pacing Pulse Width: 0.4 ms
Lead Channel Setting Pacing Pulse Width: 1 ms
Lead Channel Setting Sensing Sensitivity: 0.3 mV
Zone Setting Status: 755011

## 2022-11-01 ENCOUNTER — Other Ambulatory Visit: Payer: Self-pay | Admitting: Surgery

## 2022-11-01 ENCOUNTER — Ambulatory Visit: Payer: Medicare Other

## 2022-11-01 DIAGNOSIS — L218 Other seborrheic dermatitis: Secondary | ICD-10-CM | POA: Diagnosis not present

## 2022-11-01 DIAGNOSIS — L820 Inflamed seborrheic keratosis: Secondary | ICD-10-CM | POA: Diagnosis not present

## 2022-11-01 DIAGNOSIS — L821 Other seborrheic keratosis: Secondary | ICD-10-CM | POA: Diagnosis not present

## 2022-11-01 DIAGNOSIS — L538 Other specified erythematous conditions: Secondary | ICD-10-CM | POA: Diagnosis not present

## 2022-11-01 DIAGNOSIS — I7121 Aneurysm of the ascending aorta, without rupture: Secondary | ICD-10-CM

## 2022-11-01 DIAGNOSIS — L738 Other specified follicular disorders: Secondary | ICD-10-CM | POA: Diagnosis not present

## 2022-11-01 DIAGNOSIS — L570 Actinic keratosis: Secondary | ICD-10-CM | POA: Diagnosis not present

## 2022-11-01 DIAGNOSIS — R208 Other disturbances of skin sensation: Secondary | ICD-10-CM | POA: Diagnosis not present

## 2022-11-01 DIAGNOSIS — D1801 Hemangioma of skin and subcutaneous tissue: Secondary | ICD-10-CM | POA: Diagnosis not present

## 2022-11-06 ENCOUNTER — Encounter: Payer: Self-pay | Admitting: *Deleted

## 2022-11-20 DIAGNOSIS — D225 Melanocytic nevi of trunk: Secondary | ICD-10-CM | POA: Diagnosis not present

## 2022-11-20 DIAGNOSIS — L814 Other melanin hyperpigmentation: Secondary | ICD-10-CM | POA: Diagnosis not present

## 2022-11-20 DIAGNOSIS — L570 Actinic keratosis: Secondary | ICD-10-CM | POA: Diagnosis not present

## 2022-11-20 DIAGNOSIS — L218 Other seborrheic dermatitis: Secondary | ICD-10-CM | POA: Diagnosis not present

## 2022-11-20 DIAGNOSIS — D492 Neoplasm of unspecified behavior of bone, soft tissue, and skin: Secondary | ICD-10-CM | POA: Diagnosis not present

## 2022-11-20 DIAGNOSIS — I8393 Asymptomatic varicose veins of bilateral lower extremities: Secondary | ICD-10-CM | POA: Diagnosis not present

## 2022-11-20 DIAGNOSIS — Z7189 Other specified counseling: Secondary | ICD-10-CM | POA: Diagnosis not present

## 2022-11-20 DIAGNOSIS — L821 Other seborrheic keratosis: Secondary | ICD-10-CM | POA: Diagnosis not present

## 2022-11-29 DIAGNOSIS — H35372 Puckering of macula, left eye: Secondary | ICD-10-CM | POA: Diagnosis not present

## 2022-11-29 DIAGNOSIS — H2012 Chronic iridocyclitis, left eye: Secondary | ICD-10-CM | POA: Diagnosis not present

## 2022-11-29 DIAGNOSIS — H43811 Vitreous degeneration, right eye: Secondary | ICD-10-CM | POA: Diagnosis not present

## 2022-11-29 DIAGNOSIS — H401131 Primary open-angle glaucoma, bilateral, mild stage: Secondary | ICD-10-CM | POA: Diagnosis not present

## 2022-11-29 DIAGNOSIS — T8529XD Other mechanical complication of intraocular lens, subsequent encounter: Secondary | ICD-10-CM | POA: Diagnosis not present

## 2022-11-29 DIAGNOSIS — H353132 Nonexudative age-related macular degeneration, bilateral, intermediate dry stage: Secondary | ICD-10-CM | POA: Diagnosis not present

## 2022-11-30 NOTE — Progress Notes (Signed)
Remote ICD transmission.   

## 2022-12-01 NOTE — Patient Instructions (Incomplete)
   It was very nice to see you today!   You still have a chance to get your tetanus shot updated at your local pharmacy!   PLEASE NOTE:   If you had any lab tests please let us know if you have not heard back within a few days. You may see your results on MyChart before we have a chance to review them but we will give you a call once they are reviewed by [place]. If we ordered any referrals today, please let us know if you have not heard from their office within the next 2 weeks. You should receive a letter via MyChart confirming if the referral was approved and their office contact information to schedule.  Please try these tips to maintain a healthy lifestyle:  Eat most of your calories during the day when you are active. Eliminate processed foods including packaged sweets (pies, cakes, cookies), reduce intake of potatoes, white bread, white pasta, and white rice. Look for whole grain options, oat flour or almond flour.  Each meal should contain half fruits/vegetables, one quarter protein, and one quarter carbs (no bigger than a computer mouse).  Cut down on sweet beverages. This includes juice, soda, and sweet tea. Also watch fruit intake, though this is a healthier sweet option, it still contains natural sugar! Limit to 3 servings daily.  Drink at least 1 glass of water with each meal and aim for at least 8 glasses per day  Exercise at least 150 minutes every week.

## 2022-12-04 ENCOUNTER — Encounter: Payer: Self-pay | Admitting: Family Medicine

## 2022-12-04 ENCOUNTER — Ambulatory Visit (INDEPENDENT_AMBULATORY_CARE_PROVIDER_SITE_OTHER): Payer: Medicare Other | Admitting: Family Medicine

## 2022-12-04 VITALS — BP 111/73 | HR 78 | Ht 71.0 in | Wt 233.8 lb

## 2022-12-04 DIAGNOSIS — E7800 Pure hypercholesterolemia, unspecified: Secondary | ICD-10-CM | POA: Diagnosis not present

## 2022-12-04 DIAGNOSIS — I10 Essential (primary) hypertension: Secondary | ICD-10-CM | POA: Diagnosis not present

## 2022-12-04 DIAGNOSIS — N1831 Chronic kidney disease, stage 3a: Secondary | ICD-10-CM

## 2022-12-04 DIAGNOSIS — M5414 Radiculopathy, thoracic region: Secondary | ICD-10-CM

## 2022-12-04 LAB — BASIC METABOLIC PANEL
BUN: 22 mg/dL (ref 6–23)
CO2: 32 mEq/L (ref 19–32)
Calcium: 9.7 mg/dL (ref 8.4–10.5)
Chloride: 102 mEq/L (ref 96–112)
Creatinine, Ser: 1.52 mg/dL — ABNORMAL HIGH (ref 0.40–1.50)
GFR: 43.05 mL/min — ABNORMAL LOW (ref >60.00)
Glucose, Bld: 65 mg/dL — ABNORMAL LOW (ref 70–99)
Potassium: 5.1 mEq/L (ref 3.5–5.1)
Sodium: 141 mEq/L (ref 135–145)

## 2022-12-04 LAB — MICROALBUMIN / CREATININE URINE RATIO
Creatinine,U: 75.2 mg/dL
Microalb Creat Ratio: 0.9 mg/g (ref 0.0–30.0)
Microalb, Ur: <0.7 mg/dL (ref 0.0–1.9)

## 2022-12-04 MED ORDER — OMEPRAZOLE 40 MG PO CPDR: DELAYED_RELEASE_CAPSULE | ORAL | 1 refills | Status: DC

## 2022-12-04 NOTE — Progress Notes (Signed)
OFFICE VISIT  12/04/2022  CC:  Chief Complaint  Patient presents with   Medical Management of Chronic Issues    Pt is not fasting    Patient is a 82 y.o. male who presents for 86-month follow-up hypertension, chronic renal insufficiency, hypercholesterolemia, and right thoracic radiculopathy pain.  INTERIM HX: Mason [name] feels well other than chronic right mid back pain.  He rarely takes oxycodone but this does help some when it gets severe.  No home blood pressure monitoring. He has a sensation of collecting mucus in the back of his throat, needs to clear his throat a lot, tries to cough it up but not very successful.  Denies postnasal drip. Denies reflux or excessive burping.  He takes omeprazole 40 mg once a day.  DM managed by Dr. Elvera Lennox.  ROS as above, plus--> no fevers, no CP, no SOB, no wheezing, no cough, no dizziness, no HAs, no rashes, no melena/hematochezia.  No polyuria or polydipsia.  No myalgias or arthralgias.  No focal weakness, paresthesias, or tremors.  No acute vision or hearing abnormalities.  No dysuria or unusual/new urinary urgency or frequency.  No recent changes in lower legs. No n/v/d or abd pain.  No palpitations.     Past Medical History:  Diagnosis Date   AICD (automatic cardioverter/defibrillator) present 2018   MDT CRT-D.  Fatigue-->completely pacer dependent.  Pacer settings adjusted 12/2017 to allow more chronotrophc variance with ADL's//exertion.   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   02/2016 EF 40%, diffuse hypokinesis, grade 2 DD.  Myoc perf imaging showed EF 32% 04/2016.  Pt upgraded to CRT-D 01/04/17.   Chronic renal insufficiency, stage III  (moderate) (HCC) 2015   CrCl about 60 ml/min   Complete heart block (HCC)    Has dual chamber pacer.   COVID-19 virus infection 01/05/2021   paxlovid   Depression    DOE (dyspnea on exertion)    NYHA class II/III CHF   Dyspnea 2021   with exertion, bending over   Episodic low back pain 01/22/2013   w/intermittent radiculitis (12/2014 his neurologist referred him to pain mgmt for epidural steroid injection)   Erectile dysfunction 2019   due to zoloft--urol rx'd viagra   GERD (gastroesophageal reflux disease)    H/O tilt table evaluation 11/02/2005   negative   Helicobacter pylori gastritis 01/2016   History of adenomatous polyp of colon 10/12/2011   Dr. Elnoria Howard (3 right side of colon- tubular adenomas removed)   History of cardiovascular stress test 05/28/2012   no ischemia, EF 37%, imaging results are unchanged and within normal variance   History of chronic prostatitis    History of kidney stones    History of vertigo    + Hx of posterior HA's.  Neuro (Dr. Sandria Manly) eval 2011.  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   Hyperlipidemia    Hypertension    Lumbar spondylosis    lumbosacral radiculopathy at L4 by EMG testing, right foot drop (neurologist is Dr. Luberta Robertson with [facility] in [place])--neurologist referred him to neurosurgery   Migraine    "used to have them all the time; none  for years" (01/04/2017)   Myocardial infarction [facility]) ?1970s   not entirely certain of this   Nephrolithiasis 07/2012   Left UVJ 2 mm stone with dilation of renal collecting system and slight hydroureter on right   Neuropathy    NICM (nonischemic cardiomyopathy) (HCC)    a. 02/2018 Cath: LM nl, LAD min irregs, LCX no, RCA 20d. [id number]. Fick CO/CI 4.4/2.0.   Osteoarthritis, multiple sites    Shoulders, back, knees   Pacemaker 02/05/2012   dual chamber, complete heart block, meddtronic revo, lasted checked 12/2015.   Since no CAD on cath 05/2016, cards recommends upgrade to CRT-D.   Permanent atrial fibrillation (HCC)    DCCV 07/09/13-converted, lasted two days, then back into afib--needs lifetime anticoagulation (Xarelto as of 09/2014)   Prostate cancer screening 09/2017   done by urol annually (normal prostate exam documented + PSA 0.84 as of 10/01/17 urol f/u.  10/2018 urol f/u PSA 0.6, no prostate nodule.   Rectus diastasis    Right ankle sprain 08/2017   w/distal fibula avulsion fx noted on u/s but not plain film-(Dr. Hudnall).   Skin cancer of arm, left    "burned it off" (01/04/2017)   TIA (transient ischemic attack)    L face and L arm weakness. Peri procedural->a. 03/22/2018 following cath. CT head neg. No MRI b/c has pacer. Likely due to embolus to distal branch of RMCA   Type II diabetes mellitus (HCC)     Past Surgical History:  Procedure Laterality Date   ABI's Bilateral 05/21/2018   normal   BACK SURGERY     BIV ICD INSERTION CRT-D N/A 01/04/2017   Procedure: BiV ICD ;  Surgeon: Regan Lemming, MD;  Location: [facility] INVASIVE CV LAB;  Service: Cardiovascular;  Laterality: N/A;   CARDIAC CATHETERIZATION N/A 06/14/2016   Minimal nonobstructive dz, EF 25-35%.  Procedure: Left Heart Cath and Coronary Angiography;  Surgeon: Peter M Swaziland, MD;  Location: [facility] INVASIVE CV LAB;  Service: Cardiovascular;  Laterality: N/A;   CARDIOVASCULAR STRESS TEST  2012   2012 nuclear perfusion study: low risk scan; 04/2016 normal myocardial perfusion imaging, EF 32%.   CARDIOVERSION  07/09/2012   Procedure: CARDIOVERSION;  Surgeon: Thurmon Fair, MD;  Location: [facility] ENDOSCOPY;  Service: Cardiovascular;  Laterality: N/A;   CATARACT EXTRACTION W/ INTRAOCULAR LENS IMPLANT & ANTERIOR VITRECTOMY, BILATERAL Bilateral    CIRCUMCISION N/A 03/23/2020   Procedure: CIRCUMCISION ADULT;  Surgeon: Belva Agee, MD;  Location: [facility] ORS;  Service: Urology;  Laterality: N/A;   COLONOSCOPY W/ POLYPECTOMY  approx 2006; repeated  09/2011   Polyps on 2013 EGD as well, repeat 12/2014   COLONOSCOPY WITH PROPOFOL N/A 07/08/2021   adenoma x 1. Procedure: COLONOSCOPY WITH PROPOFOL;  Surgeon: Jeani Hawking, MD;  Location: [facility] ENDOSCOPY;  Service: Endoscopy;  Laterality: N/A;   ESOPHAGOGASTRODUODENOSCOPY  10/18/2006   Done due to chronic GERD: Normal, bx showed no barrett's esophagus (Dr. Elnoria Howard)   FLEXOR TENDON REPAIR Left 10/02/2016   Procedure: LEFT RING FINGER WOUND EXPORATION AND FLEXOR TENDON REPAIR AND NERVE REPAIR;  Surgeon: Mack Hook, MD;  Location: [facility] OR;  Service: Orthopedics;  Laterality: Left;   INSERT / REPLACE / REMOVE PACEMAKER  02/05/2012   dual chamber, sinus node dysfunction, sinus arrest, PAF, Medtronic Revo serial#-[id number] H: last checked 05/2015   LUMBAR LAMINECTOMY Left 1976   L4-5   PACEMAKER REMOVAL  01/04/2017   PERMANENT PACEMAKER INSERTION N/A 02/05/2012   Procedure: PERMANENT PACEMAKER INSERTION;  Surgeon: Thurmon Fair, MD; Generator Medtronic  Revo model [id number] serial number [id number] H Laterality: N/A;   POLYPECTOMY  07/08/2021   Procedure: POLYPECTOMY;  Surgeon: Jeani Hawking, MD;  Location: [facility] ENDOSCOPY;  Service: Endoscopy;;   RETINAL DETACHMENT SURGERY Left ~ 1999   REVERSE SHOULDER ARTHROPLASTY Left 2018   Left shoulder reverse [facility] in [place]).   RIGHT/LEFT HEART CATH AND CORONARY ANGIOGRAPHY N/A 03/22/2018   EF 30-35%, no CAD.  Procedure: RIGHT/LEFT HEART CATH AND CORONARY ANGIOGRAPHY;  Surgeon: Dolores Patty, MD;  Location: [facility] INVASIVE CV LAB;  Service: Cardiovascular;  Laterality: N/A;   TRANSTHORACIC ECHOCARDIOGRAM  08/25/10; 05/2012; 03/23/16;12/2017   mild asymmetric LVH, normal systolic function, normal diastolic fxn, mild-to-mod mitral regurg, mild aortic valve sclerosis and trace AI, mild aortic root dilatation. 2014 f/u showed EF 40-45%, mod LAE, A FIB.  02/2016 EF 40%, diffuse hypokinesis, grade 2 DD. 12/2017 EF 35-40%,diffuse hypokin,grd III DD, mild MR    WISDOM TOOTH EXTRACTION  06/27/2021   EF 50-55%, mild aortic root/ascending aorta dilatation (41-42 mm).    Outpatient Medications Prior to Visit  Medication Sig Dispense Refill   acetaminophen (TYLENOL) 325 MG tablet Take 650 mg by mouth every 6 (six) hours as needed for moderate pain.     BD INSULIN SYRINGE U/F 31G X 5/16" 1 ML MISC USE DAILY AS DIRECTED 100 each 3   COMBIGAN 0.2-0.5 % ophthalmic solution Place 1 drop into both eyes at bedtime.      Continuous Blood Gluc Receiver (FREESTYLE LIBRE 2 READER) DEVI 1 each by Does not apply route daily. 1 each 3   Continuous Blood Gluc Sensor (FREESTYLE LIBRE 2 SENSOR) MISC 1 each by Does not apply route every 14 (fourteen) days. 6 each 3   diclofenac Sodium (VOLTAREN) 1 % GEL Apply 4 g topically 4 (four) times daily. 100 g 1   Dulaglutide (TRULICITY) 3 MG/0.5ML SOPN Inject 3 mg into the skin once a week. 6 mL 3   empagliflozin (JARDIANCE) 10 MG TABS tablet Take 1 tablet (10 mg total) by mouth daily. 90 tablet 3   ENTRESTO 97-103 MG TAKE 1 TABLET TWO TIMES A DAY 180 tablet 2   eplerenone (INSPRA) 25 MG tablet TAKE 1 TABLET DAILY 90 tablet 3   gabapentin (NEURONTIN) 300 MG capsule Take 2 capsules (600 mg total) by mouth 2 (two) times daily. 360 capsule 1   insulin aspart (NOVOLOG FLEXPEN) 100 UNIT/ML FlexPen INJECT 6-8 UNITS UNDER THE SKIN AT THE TIME OF EACH MEAL (Patient taking differently: Pt stated 20 units in the a.m only   UNDER THE SKIN AT THE TIME OF EACH MEAL) 15 mL 4   ketoconazole (NIZORAL) 2 % cream Apply 1 Application topically daily.     ketorolac (ACULAR) 0.5 % ophthalmic solution      LANTUS 100 UNIT/ML injection INJECT 40 TO 45 UNITS TOTAL UNDER THE SKIN DAILY (Patient taking differently: 42 Units daily.) 40 mL 3   lidocaine (LIDODERM) 5 % PLACE 1 PATCH ON THE SKIN DAILY. REMOVE AND DISCARD PATCH WITHIN 12 HOURS OR AS DIRECTED BY DOCTOR 90 patch 0   meclizine (ANTIVERT) 25 MG tablet TAKE 1 TABLET THREE TIMES A DAY AS NEEDED FOR  DIZZINESS. 270 tablet 3   metoprolol succinate (TOPROL-XL) 50 MG 24 hr tablet TAKE 1 TABLET DAILY. TAKE WITH OR IMMEDIATELY FOLLOWING A MEAL Strength: 50 mg 90 tablet 1   oxybutynin (DITROPAN XL) 10 MG 24 hr tablet Take 1 tablet (10 mg total) by mouth at bedtime. 90 tablet 1  rivaroxaban (XARELTO) 20 MG TABS tablet TAKE 1 TABLET DAILY WITH SUPPER 90 tablet 1   ROCKLATAN 0.02-0.005 % SOLN Apply to eye.     rOPINIRole (REQUIP) 1 MG tablet Take 1 tablet (1 mg total) by mouth daily. 90 tablet 0   rosuvastatin (CRESTOR) 20 MG tablet Take 1 tablet (20 mg total) by mouth daily. 90 tablet 0   sertraline (ZOLOFT) 100 MG tablet Take 1 tablet (100 mg total) by mouth daily. 90 tablet 0   SURE COMFORT PEN NEEDLES 31G X 5 MM MISC USE TO INJECT INSULIN UNDER THE SKIN 100 each 11   omeprazole (PRILOSEC) 40 MG capsule Take 1 capsule (40 mg total) by mouth daily. 90 capsule 1   No facility-administered medications prior to visit.    No Known Allergies  Review of Systems As per HPI  PE:    12/04/2022   10:03 AM 09/29/2022    9:26 AM 09/11/2022   10:26 AM  Vitals with BMI  Height 5\' 11"  5\' 11"  5\' 11"   Weight 233 lbs 13 oz 229 lbs 230 lbs  BMI 32.62 31.95 32.09  Systolic 111 122 92  Diastolic 73 80 63  Pulse 78 107 81     Physical Exam  Gen: Alert, well appearing.  Patient is oriented to person, place, time, and situation. AFFECT: pleasant, lucid thought and speech. ZOX:WRUE: no injection, icteris, swelling, or exudate.  EOMI, PERRLA. Mouth: lips without lesion/swelling.  Oral mucosa pink and tacky. Oropharynx without erythema, exudate, or swelling.  CV: RRR, no m/r/g.   LUNGS: CTA bilat, nonlabored resps, good aeration in all lung fields. EXT: no clubbing or cyanosis.  no edema.    LABS:  Last CBC Lab Results  Component Value Date   WBC 5.6 06/09/2021   HGB 16.0 06/09/2021   HCT 49.2 06/09/2021   MCV 92.5 06/09/2021   MCH 30.3 03/19/2020   RDW 13.6 06/09/2021   PLT 129.0 (L)  06/09/2021   Last metabolic panel Lab Results  Component Value Date   GLUCOSE 172 (H) 09/11/2022   NA 139 09/11/2022   K 4.3 09/11/2022   CL 101 09/11/2022   CO2 30 09/11/2022   BUN 19 09/11/2022   CREATININE 1.40 09/11/2022   GFRNONAA 37 (L) 03/23/2020   CALCIUM 9.9 09/11/2022   PROT 7.6 09/11/2022   ALBUMIN 4.3 09/11/2022   BILITOT 0.6 09/11/2022   ALKPHOS 89 09/11/2022   AST 19 09/11/2022   ALT 18 09/11/2022   ANIONGAP 9 03/23/2020   Last lipids Lab Results  Component Value Date   CHOL 132 09/11/2022   HDL 45.80 09/11/2022   LDLCALC 54 09/11/2022   LDLDIRECT 103.0 06/22/2020   TRIG 161.0 (H) 09/11/2022   CHOLHDL 3 09/11/2022   Last hemoglobin A1c Lab Results  Component Value Date   HGBA1C 7.1 (A) 09/29/2022   Last thyroid functions Lab Results  Component Value Date   TSH 2.963 04/04/2018   IMPRESSION AND PLAN:  #1 hypertension.  His blood pressure is excellent on Toprol-XL 50 mg a day, eplerenone 25 mg a day, and Entresto 97-1 031 tab twice a day. Electrolytes and creatinine today.  2.  Chronic renal insufficiency stage III, GFR in the 40s. He avoids NSAIDs. Fluid balance is good. Electrolytes and creatinine today.  3.  Right thoracic radiculopathy pain. He takes oxycodone but only occasionally. He is established with Dr. Dutch Quint.    #4 hypercholesterolemia, doing well on Crestor 20 mg a day. Last LDL 3 months ago  was 54.  Hepatic panel was normal at that time. Plan repeat lipid panel 4 months.  An After Visit Summary was printed and given to the patient.  FOLLOW UP: Return in about 4 months (around 04/06/2023) for routine chronic illness f/u.  Signed:  Santiago Bumpers, MD           12/04/2022

## 2022-12-11 ENCOUNTER — Ambulatory Visit: Payer: Medicare Other | Admitting: Family Medicine

## 2022-12-27 ENCOUNTER — Ambulatory Visit
Admission: RE | Admit: 2022-12-27 | Discharge: 2022-12-27 | Disposition: A | Discharge status: Home / Self Care | Payer: Medicare Other | Source: Ambulatory Visit | Attending: Surgery | Admitting: Surgery

## 2022-12-27 ENCOUNTER — Other Ambulatory Visit: Payer: Self-pay | Admitting: Family Medicine

## 2022-12-27 ENCOUNTER — Ambulatory Visit (INDEPENDENT_AMBULATORY_CARE_PROVIDER_SITE_OTHER): Payer: Medicare Other | Admitting: Physician Assistant

## 2022-12-27 VITALS — BP 88/49 | HR 88 | Resp 20 | Ht 71.0 in | Wt 229.0 lb

## 2022-12-27 DIAGNOSIS — I7121 Aneurysm of the ascending aorta, without rupture: Secondary | ICD-10-CM

## 2022-12-27 DIAGNOSIS — I700 Atherosclerosis of aorta: Secondary | ICD-10-CM | POA: Diagnosis not present

## 2022-12-27 MED ORDER — IOPAMIDOL (ISOVUE-370) INJECTION 76%
75.0000 mL | Freq: Once | INTRAVENOUS | Status: AC | PRN
  Administered 2022-12-27: 60 mL via INTRAVENOUS

## 2022-12-27 NOTE — Progress Notes (Signed)
[address]       [name] [address]             [phone]    Mason [name] [id number] 11-Feb-1941  History of Present Illness:  Mason [name] is an 82 yo male with history of DM, HTN, Peripheral Neuropathy, GERD, chronic fatigue, and multiple musculoskeletal problems with chronic back pain.  She is known to TCTS due to an Ascending aortic aneurysm last measuring 4.2 cm.  He was last seen in our office in October and instructed to return for 6 month follow up.  The patient states he isn't doing great, but could be doing worse.  He denies chest pain and shortness of breath.  Has chronic issues with back pain.  Appears to have resting tremor as well.  Current Outpatient Medications on File Prior to Visit  Medication Sig Dispense Refill   acetaminophen (TYLENOL) 325 MG tablet Take 650 mg by mouth every 6 (six) hours as needed for moderate pain.     BD INSULIN SYRINGE U/F 31G X 5/16" 1 ML MISC USE DAILY AS DIRECTED 100 each 3   COMBIGAN 0.2-0.5 % ophthalmic solution Place 1 drop into both eyes at bedtime.      Continuous Blood Gluc Receiver (FREESTYLE LIBRE 2 READER) DEVI 1 each by Does not apply route daily. 1 each 3   Continuous Blood Gluc Sensor (FREESTYLE LIBRE 2 SENSOR) MISC 1 each by Does not apply route every 14 (fourteen) days. 6 each 3   diclofenac Sodium (VOLTAREN) 1 % GEL Apply 4 g topically 4 (four) times daily. 100 g 1   Dulaglutide (TRULICITY) 3 MG/0.5ML SOPN Inject 3 mg into the skin once a week. 6 mL 3   empagliflozin (JARDIANCE) 10 MG TABS tablet Take 1 tablet (10 mg total) by mouth daily. 90 tablet 3   ENTRESTO 97-103 MG TAKE 1 TABLET TWO TIMES A DAY 180 tablet 2   eplerenone (INSPRA) 25 MG tablet TAKE 1 TABLET DAILY 90 tablet 3   gabapentin (NEURONTIN) 300 MG capsule Take 2 capsules (600 mg total) by mouth 2 (two) times daily. 360 capsule 1   insulin aspart (NOVOLOG FLEXPEN) 100 UNIT/ML FlexPen INJECT 6-8 UNITS UNDER THE SKIN AT THE TIME OF EACH MEAL  (Patient taking differently: Pt stated 20 units in the a.m only   UNDER THE SKIN AT THE TIME OF EACH MEAL) 15 mL 4   ketoconazole (NIZORAL) 2 % cream Apply 1 Application topically daily.     ketorolac (ACULAR) 0.5 % ophthalmic solution      LANTUS 100 UNIT/ML injection INJECT 40 TO 45 UNITS TOTAL UNDER THE SKIN DAILY (Patient taking differently: 42 Units daily.) 40 mL 3   lidocaine (LIDODERM) 5 % PLACE 1 PATCH ON THE SKIN DAILY. REMOVE AND DISCARD PATCH WITHIN 12 HOURS OR AS DIRECTED BY DOCTOR 90 patch 0   meclizine (ANTIVERT) 25 MG tablet TAKE 1 TABLET THREE TIMES A DAY AS NEEDED FOR DIZZINESS. 270 tablet 3   metoprolol succinate (TOPROL-XL) 50 MG 24 hr tablet TAKE 1 TABLET DAILY. TAKE WITH OR IMMEDIATELY FOLLOWING A MEAL Strength: 50 mg 90 tablet 1   omeprazole (PRILOSEC) 40 MG capsule 1 cap po bid 180 capsule 1   oxybutynin (DITROPAN XL) 10 MG 24 hr tablet Take 1 tablet (10 mg total) by mouth at bedtime. 90 tablet 1   rivaroxaban (XARELTO) 20 MG TABS tablet TAKE 1 TABLET DAILY WITH SUPPER 90 tablet 1  ROCKLATAN 0.02-0.005 % SOLN Apply to eye.     rOPINIRole (REQUIP) 1 MG tablet Take 1 tablet (1 mg total) by mouth daily. 90 tablet 0   rosuvastatin (CRESTOR) 20 MG tablet Take 1 tablet (20 mg total) by mouth daily. 90 tablet 0   sertraline (ZOLOFT) 100 MG tablet Take 1 tablet (100 mg total) by mouth daily. 90 tablet 0   SURE COMFORT PEN NEEDLES 31G X 5 MM MISC USE TO INJECT INSULIN UNDER THE SKIN 100 each 11   No current facility-administered medications on file prior to visit.   BP (!) 88/49   Pulse 88   Resp 20   Ht 5\' 11"  (1.803 m)   Wt 229 lb (103.9 kg)   SpO2 96% Comment: RA  BMI 31.94 kg/m   Physical Exam  Gen: NAD Heart: RRR Lungs: CTA bilaterally Neck: no bruit Ext: trace edema Neuro: mild resting tremor, grossly intact  CTA Results:  EXAM: CT ANGIOGRAPHY CHEST WITH CONTRAST   TECHNIQUE: Multidetector CT imaging of the chest was performed using the standard  protocol during bolus administration of intravenous contrast. Multiplanar CT image reconstructions and MIPs were obtained to evaluate the vascular anatomy.   RADIATION DOSE REDUCTION: This exam was performed according to the departmental dose-optimization program which includes automated exposure control, adjustment of the mA and/or kV according to patient size and/or use of iterative reconstruction technique.   CONTRAST:  60mL ISOVUE-370 IOPAMIDOL (ISOVUE-370) INJECTION 76%   COMPARISON:  June 21, 2022.   FINDINGS: Cardiovascular: Grossly stable 4.1 cm ascending thoracic aortic aneurysm. Normal cardiac size. No pericardial effusion. Minimal coronary artery calcifications are noted. Left-sided pacemaker is noted.   Mediastinum/Nodes: No enlarged mediastinal, hilar, or axillary lymph nodes. Thyroid gland, trachea, and esophagus demonstrate no significant findings.   Lungs/Pleura: Lungs are clear. No pleural effusion or pneumothorax.   Upper Abdomen: No acute abnormality.   Musculoskeletal: Status post left shoulder arthroplasty. No acute osseous abnormality is noted.   Review of the MIP images confirms the above findings.   IMPRESSION: Grossly stable 4.1 cm ascending thoracic aortic aneurysm. Recommend annual imaging followup by CTA or MRA. This recommendation follows 2010 ACCF/AHA/AATS/ACR/ASA/SCA/SCAI/SIR/STS/SVM Guidelines for the Diagnosis and Management of Patients with Thoracic Aortic Disease. Circulation. 2010; 121: Z610-R604. Aortic aneurysm NOS (ICD10-I71.9).   Aortic Atherosclerosis (ICD10-I70.0).     Electronically Signed   By: Lupita Raider M.D.   On: 12/27/2022 12:54    A/P:  Stable Ascending Aortic Aneurysm- measuring 4.1 cm on CTA- will require repeat surveillance in 1 year Chronic Back pain- due to multiple interventions DM    Risk Modification:  Statin:  Yes  Smoking cessation instruction/counseling given:  No  Patient was counseled  on importance of Blood Pressure Control.  Despite Medical intervention if the patient notices persistently elevated blood pressure readings.  They are instructed to contact their Primary Care Physician  Please avoid use of Fluoroquinolones as this can potentially increase your risk of Aortic Rupture and/or Dissection  Patient educated on signs and symptoms of Aortic Dissection, handout also provided in AVS  Nayleah Gamel, PA-C 12/27/22

## 2022-12-28 DIAGNOSIS — L821 Other seborrheic keratosis: Secondary | ICD-10-CM | POA: Diagnosis not present

## 2022-12-28 DIAGNOSIS — L570 Actinic keratosis: Secondary | ICD-10-CM | POA: Diagnosis not present

## 2022-12-28 DIAGNOSIS — D492 Neoplasm of unspecified behavior of bone, soft tissue, and skin: Secondary | ICD-10-CM | POA: Diagnosis not present

## 2023-01-08 HISTORY — PX: EYE SURGERY: SHX253

## 2023-01-10 DIAGNOSIS — T8529XD Other mechanical complication of intraocular lens, subsequent encounter: Secondary | ICD-10-CM | POA: Diagnosis not present

## 2023-01-10 DIAGNOSIS — H2702 Aphakia, left eye: Secondary | ICD-10-CM | POA: Diagnosis not present

## 2023-01-10 DIAGNOSIS — T8522XA Displacement of intraocular lens, initial encounter: Secondary | ICD-10-CM | POA: Diagnosis not present

## 2023-01-11 ENCOUNTER — Other Ambulatory Visit: Payer: Self-pay | Admitting: Family Medicine

## 2023-01-11 ENCOUNTER — Ambulatory Visit: Payer: Medicare Other | Admitting: Family Medicine

## 2023-01-11 LAB — HM DIABETES EYE EXAM

## 2023-01-12 ENCOUNTER — Telehealth: Payer: Self-pay

## 2023-01-12 ENCOUNTER — Ambulatory Visit: Payer: Medicare Other | Admitting: Family Medicine

## 2023-01-12 MED ORDER — SEMAGLUTIDE (1 MG/DOSE) 4 MG/3ML ~~LOC~~ SOPN: 1.0000 mg | PEN_INJECTOR | SUBCUTANEOUS | 0 refills | Status: DC

## 2023-01-12 NOTE — Telephone Encounter (Signed)
I called and left a detailed message with specific instructions.  Rx has been sent.

## 2023-01-12 NOTE — Telephone Encounter (Signed)
Pt called stating his Trulicity is on back order, Any alternatives?

## 2023-01-15 ENCOUNTER — Ambulatory Visit (INDEPENDENT_AMBULATORY_CARE_PROVIDER_SITE_OTHER): Payer: Medicare Other | Admitting: Family Medicine

## 2023-01-15 ENCOUNTER — Encounter: Payer: Self-pay | Admitting: Family Medicine

## 2023-01-15 VITALS — BP 103/61 | HR 81 | Temp 97.8°F | Ht 71.0 in | Wt 229.6 lb

## 2023-01-15 DIAGNOSIS — M5414 Radiculopathy, thoracic region: Secondary | ICD-10-CM | POA: Diagnosis not present

## 2023-01-15 MED ORDER — OXYCODONE-ACETAMINOPHEN 10-325 MG PO TABS: ORAL_TABLET | ORAL | 0 refills | Status: DC

## 2023-01-15 NOTE — Patient Instructions (Signed)

## 2023-01-15 NOTE — Progress Notes (Signed)
OFFICE VISIT  01/15/2023  CC:  Chief Complaint  Patient presents with   Back Pain    Current pain level 8/10, not as bad as it has been but worse when lying down or sitting up    Patient is a 82 y.o. male who presents for recent worsening of his pain in his right side.  He has a history of thoracic radiculopathy.  HPI: About a week ago he started having his shooting/jabbing pain from lower thoracic on the right side radiating around the right to the right subcostal area anteriorly. No rash.  No preceding incident. Initially 8 out of 10 intensity, particularly with moving or laying on that side.  Has let up a little in the last 1 to 2 days. No fever, no shortness of breath, no cough, no buttock or leg pain.  Past Medical History:  Diagnosis Date   AICD (automatic cardioverter/defibrillator) present 2018   MDT CRT-D.  Fatigue-->completely pacer dependent.  Pacer settings adjusted 12/2017 to allow more chronotrophc variance with ADL's//exertion.   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   02/2016 EF 40%, diffuse hypokinesis, grade 2 DD.  Myoc perf imaging showed EF 32% 04/2016.  Pt upgraded to CRT-D 01/04/17.   Chronic renal insufficiency, stage III (moderate) (HCC) 2015   CrCl about 60 ml/min   Complete heart block (HCC)    Has dual chamber pacer.   COVID-19 virus infection 01/05/2021   paxlovid   Depression    DOE (dyspnea on exertion)    NYHA class II/III CHF   Dyspnea 2021   with exertion, bending over   Episodic low back pain 01/22/2013   w/intermittent radiculitis (12/2014 his neurologist referred him to pain mgmt for epidural steroid injection)   Erectile dysfunction 2019    due to zoloft--urol rx'd viagra   GERD (gastroesophageal reflux disease)    H/O tilt table evaluation 11/02/2005   negative   Helicobacter pylori gastritis 01/2016   History of adenomatous polyp of colon 10/12/2011   Dr. Elnoria Howard (3 right side of colon- tubular adenomas removed)   History of cardiovascular stress test 05/28/2012   no ischemia, EF 37%, imaging results are unchanged and within normal variance   History of chronic prostatitis    History of kidney stones    History of vertigo    + Hx of posterior HA's.  Neuro (Dr. Sandria Manly) eval 2011.  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   Hyperlipidemia    Hypertension    Lumbar spondylosis    lumbosacral radiculopathy at L4 by EMG testing, right foot drop (neurologist is Dr. Luberta Robertson with [facility] in [place])--neurologist referred him to neurosurgery   Migraine    "used to have them all the time; none for years" (01/04/2017)   Myocardial infarction [facility]) ?1970s   not entirely certain of this   Nephrolithiasis 07/2012   Left UVJ 2 mm stone with dilation of renal collecting system and slight hydroureter on right   Neuropathy    NICM (nonischemic cardiomyopathy) (HCC)    a. 02/2018 Cath: LM nl, LAD min irregs, LCX no, RCA 20d. [id number]. Fick CO/CI 4.4/2.0.  Osteoarthritis, multiple sites    Shoulders, back, knees   Pacemaker 02/05/2012   dual chamber, complete heart block, meddtronic revo, lasted checked 12/2015.  Since no CAD on cath 05/2016, cards recommends upgrade to CRT-D.   Permanent atrial fibrillation (HCC)    DCCV 07/09/13-converted, lasted two days, then back into afib--needs lifetime anticoagulation (Xarelto as of 09/2014)   Prostate cancer screening 09/2017   done by urol annually (normal prostate exam documented + PSA 0.84 as of 10/01/17 urol f/u.  10/2018 urol f/u PSA 0.6, no prostate nodule.   Rectus diastasis    Right ankle sprain 08/2017    w/distal fibula avulsion fx noted on u/s but not plain film-(Dr. Hudnall).   Skin cancer of arm, left    "burned it off" (01/04/2017)   TIA (transient ischemic attack)    L face and L arm weakness. Peri procedural->a. 03/22/2018 following cath. CT head neg. No MRI b/c has pacer. Likely due to embolus to distal branch of RMCA   Type II diabetes mellitus (HCC)     Past Surgical History:  Procedure Laterality Date   ABI's Bilateral 05/21/2018   normal   BACK SURGERY     BIV ICD INSERTION CRT-D N/A 01/04/2017   Procedure: BiV ICD ;  Surgeon: Regan Lemming, MD;  Location: [facility] INVASIVE CV LAB;  Service: Cardiovascular;  Laterality: N/A;   CARDIAC CATHETERIZATION N/A 06/14/2016   Minimal nonobstructive dz, EF 25-35%.  Procedure: Left Heart Cath and Coronary Angiography;  Surgeon: Peter M Swaziland, MD;  Location: [facility] INVASIVE CV LAB;  Service: Cardiovascular;  Laterality: N/A;   CARDIOVASCULAR STRESS TEST  2012   2012 nuclear perfusion study: low risk scan; 04/2016 normal myocardial perfusion imaging, EF 32%.   CARDIOVERSION  07/09/2012   Procedure: CARDIOVERSION;  Surgeon: Thurmon Fair, MD;  Location: [facility] ENDOSCOPY;  Service: Cardiovascular;  Laterality: N/A;   CATARACT EXTRACTION W/ INTRAOCULAR LENS IMPLANT & ANTERIOR VITRECTOMY, BILATERAL Bilateral    CIRCUMCISION N/A 03/23/2020   Procedure: CIRCUMCISION ADULT;  Surgeon: Belva Agee, MD;  Location: [facility] ORS;  Service: Urology;  Laterality: N/A;   COLONOSCOPY W/ POLYPECTOMY  approx 2006; repeated 09/2011   Polyps on 2013 EGD as well, repeat 12/2014   COLONOSCOPY WITH PROPOFOL N/A 07/08/2021   adenoma x 1. Procedure: COLONOSCOPY WITH PROPOFOL;  Surgeon: Jeani Hawking, MD;  Location: [facility] ENDOSCOPY;  Service: Endoscopy;  Laterality: N/A;   ESOPHAGOGASTRODUODENOSCOPY  10/18/2006   Done due to chronic GERD: Normal, bx showed no barrett's esophagus (Dr. Elnoria Howard)   EYE SURGERY  01/08/2023   FLEXOR TENDON REPAIR Left 10/02/2016   Procedure: LEFT  RING FINGER WOUND EXPORATION AND FLEXOR TENDON REPAIR AND NERVE REPAIR;  Surgeon: Mack Hook, MD;  Location: [facility] OR;  Service: Orthopedics;  Laterality: Left;   INSERT / REPLACE / REMOVE PACEMAKER  02/05/2012   dual chamber, sinus node dysfunction, sinus arrest, PAF, Medtronic Revo serial#[id number] H: last checked 05/2015   LUMBAR LAMINECTOMY Left 1976   L4-5   PACEMAKER REMOVAL  01/04/2017   PERMANENT PACEMAKER INSERTION N/A 02/05/2012   Procedure: PERMANENT PACEMAKER INSERTION;  Surgeon: Thurmon Fair, MD; Generator Medtronic [place] model [place] serial number [id number] H Laterality: N/A;   POLYPECTOMY  07/08/2021   Procedure: POLYPECTOMY;  Surgeon: Jeani Hawking, MD;  Location: [facility] ENDOSCOPY;  Service: Endoscopy;;   RETINAL DETACHMENT SURGERY Left ~ 1999   REVERSE SHOULDER ARTHROPLASTY Left 2018   Left shoulder reverse [facility] [place]).   RIGHT/LEFT HEART CATH AND CORONARY ANGIOGRAPHY  N/A 03/22/2018   EF 30-35%, no CAD.  Procedure: RIGHT/LEFT HEART CATH AND CORONARY ANGIOGRAPHY;  Surgeon: Dolores Patty, MD;  Location: [facility] INVASIVE CV LAB;  Service: Cardiovascular;  Laterality: N/A;   TRANSTHORACIC ECHOCARDIOGRAM  08/25/10; 05/2012; 03/23/16;12/2017   mild asymmetric LVH, normal systolic function, normal diastolic fxn, mild-to-mod mitral regurg, mild aortic valve sclerosis and trace AI, mild aortic root dilatation. 2014 f/u showed EF 40-45%, mod LAE, A FIB.  02/2016 EF 40%, diffuse hypokinesis, grade 2 DD. 12/2017 EF 35-40%,diffuse hypokin,grd III DD, mild MR   WISDOM TOOTH EXTRACTION  06/27/2021   EF 50-55%, mild aortic root/ascending aorta dilatation (41-42 mm).    Outpatient Medications Prior to Visit  Medication Sig Dispense Refill   acetaminophen (TYLENOL) 325 MG tablet Take 650 mg by mouth every 6 (six) hours as needed for moderate pain.     BD INSULIN SYRINGE U/F 31G X 5/16" 1 ML MISC USE DAILY AS DIRECTED 100 each 3   COMBIGAN 0.2-0.5 % ophthalmic solution Place 1  drop into both eyes at bedtime.      Continuous Blood Gluc Receiver (FREESTYLE LIBRE 2 READER) DEVI 1 each by Does not apply route daily. 1 each 3   Continuous Blood Gluc Sensor (FREESTYLE LIBRE 2 SENSOR) MISC 1 each by Does not apply route every 14 (fourteen) days. 6 each 3   diclofenac Sodium (VOLTAREN) 1 % GEL Apply 4 g topically 4 (four) times daily. 100 g 1   Dulaglutide (TRULICITY) 3 MG/0.5ML SOPN Inject 3 mg into the skin once a week. 6 mL 3   empagliflozin (JARDIANCE) 10 MG TABS tablet Take 1 tablet (10 mg total) by mouth daily. 90 tablet 3   ENTRESTO 97-103 MG TAKE 1 TABLET TWO TIMES A DAY 180 tablet 2   eplerenone (INSPRA) 25 MG tablet TAKE 1 TABLET DAILY 90 tablet 3   gabapentin (NEURONTIN) 300 MG capsule Take 2 capsules (600 mg total) by mouth 2 (two) times daily. 360 capsule 1   insulin aspart (NOVOLOG FLEXPEN) 100 UNIT/ML FlexPen INJECT 6-8 UNITS UNDER THE SKIN AT THE TIME OF EACH MEAL (Patient taking differently: Pt stated 20 units in the a.m only   UNDER THE SKIN AT THE TIME OF EACH MEAL) 15 mL 4   ketoconazole (NIZORAL) 2 % cream Apply 1 Application topically daily.     ketorolac (ACULAR) 0.5 % ophthalmic solution      LANTUS 100 UNIT/ML injection INJECT 40 TO 45 UNITS TOTAL UNDER THE SKIN DAILY (Patient taking differently: 42 Units daily.) 40 mL 3   lidocaine (LIDODERM) 5 % PLACE 1 PATCH ON THE SKIN DAILY. REMOVE AND DISCARD PATCH WITHIN 12 HOURS OR AS DIRECTED BY DOCTOR 90 patch 0   meclizine (ANTIVERT) 25 MG tablet TAKE 1 TABLET THREE TIMES A DAY AS NEEDED FOR DIZZINESS 270 tablet 0   metoprolol succinate (TOPROL-XL) 50 MG 24 hr tablet TAKE 1 TABLET DAILY. TAKE WITH OR IMMEDIATELY FOLLOWING A MEAL Strength: 50 mg 90 tablet 1   omeprazole (PRILOSEC) 40 MG capsule 1 cap po bid 180 capsule 1   oxybutynin (DITROPAN XL) 10 MG 24 hr tablet Take 1 tablet (10 mg total) by mouth at bedtime. 90 tablet 1   rivaroxaban (XARELTO) 20 MG TABS tablet TAKE 1 TABLET DAILY WITH SUPPER 90 tablet 1    ROCKLATAN 0.02-0.005 % SOLN Apply to eye.     rOPINIRole (REQUIP) 1 MG tablet Take 1 tablet (1 mg total) by mouth daily. 90 tablet 0  rosuvastatin (CRESTOR) 20 MG tablet Take 1 tablet (20 mg total) by mouth daily. 90 tablet 0   Semaglutide, 1 MG/DOSE, 4 MG/3ML SOPN Inject 1 mg as directed once a week. 3 mL 0   sertraline (ZOLOFT) 100 MG tablet Take 1 tablet (100 mg total) by mouth daily. 90 tablet 0   SURE COMFORT PEN NEEDLES 31G X 5 MM MISC USE TO INJECT INSULIN UNDER THE SKIN 100 each 11   No facility-administered medications prior to visit.    No Known Allergies  Review of Systems  As per HPI  PE:    01/15/2023   11:05 AM 12/27/2022   12:52 PM 12/04/2022   10:03 AM  Vitals with BMI  Height 5\' 11"  5\' 11"  5\' 11"   Weight 229 lbs 10 oz 229 lbs 233 lbs 13 oz  BMI 32.04 31.95 32.62  Systolic 103 88 111  Diastolic 61 49 73  Pulse 81 88 78     Physical Exam  Gen: Alert, well appearing.  Patient is oriented to person, place, time, and situation. AFFECT: pleasant, lucid thought and speech. Mild discomfort to palpation along the mid to lower thoracic region on the right extending along the right rib cage. No rash.  LABS:  Last CBC Lab Results  Component Value Date   WBC 5.6 06/09/2021   HGB 16.0 06/09/2021   HCT 49.2 06/09/2021   MCV 92.5 06/09/2021   MCH 30.3 03/19/2020   RDW 13.6 06/09/2021   PLT 129.0 (L) 06/09/2021   Last metabolic panel Lab Results  Component Value Date   GLUCOSE 65 (L) 12/04/2022   NA 141 12/04/2022   K 5.1 12/04/2022   CL 102 12/04/2022   CO2 32 12/04/2022   BUN 22 12/04/2022   CREATININE 1.52 (H) 12/04/2022   GFRNONAA 37 (L) 03/23/2020   CALCIUM 9.7 12/04/2022   PROT 7.6 09/11/2022   ALBUMIN 4.3 09/11/2022   BILITOT 0.6 09/11/2022   ALKPHOS 89 09/11/2022   AST 19 09/11/2022   ALT 18 09/11/2022   ANIONGAP 9 03/23/2020   Lab Results  Component Value Date   HGBA1C 7.1 (A) 09/29/2022   IMPRESSION AND PLAN:  Thoracic  radiculopathy. The only thing that has helped him in the past with this pain is Percocet.  He has always used this type of medication appropriately. We discussed possibly maximizing his dose of gabapentin but chose to hold off today. Instead I prescribed Percocet 10-325, half to 1 tab 3 times daily as needed, #30.  An After Visit Summary was printed and given to the patient.  FOLLOW UP: Return for keep appt set for July.  Signed:  Santiago Bumpers, MD           01/15/2023

## 2023-01-17 ENCOUNTER — Telehealth: Payer: Self-pay | Admitting: Family Medicine

## 2023-01-17 NOTE — Telephone Encounter (Signed)
I really do not know.  Please call patient and ask him.

## 2023-01-17 NOTE — Telephone Encounter (Signed)
Mason [name] with [facility] called to get clarity on medication sent for [employer]. He wanted to make sure Oxycodone was meant to be sent there and not to a local pharmacy because the quantity is so low and [facility] states [facility] only [facility] a 7 day supply not 10. Please give Mason [name] a call at [phone]. Ref number [id number]  oxyCODONE-acetaminophen (PERCOCET) 10-325 MG tablet

## 2023-01-17 NOTE — Telephone Encounter (Signed)
Please confirm if rx should be resent to local pharmacy

## 2023-01-17 NOTE — Telephone Encounter (Addendum)
Spoke with pt, he confirmed he wanted it sent to [facility]. Contacted [facility] and spoke with pharmacist, Jesusita Oka; they can only send 21 tablets. Advised to continue with 7 day supply instead of 10 day supply as written. Pt will be informed by them.

## 2023-01-18 DIAGNOSIS — H401131 Primary open-angle glaucoma, bilateral, mild stage: Secondary | ICD-10-CM | POA: Diagnosis not present

## 2023-01-18 DIAGNOSIS — H4312 Vitreous hemorrhage, left eye: Secondary | ICD-10-CM | POA: Diagnosis not present

## 2023-01-22 ENCOUNTER — Other Ambulatory Visit: Payer: Self-pay | Admitting: Internal Medicine

## 2023-01-24 ENCOUNTER — Ambulatory Visit (INDEPENDENT_AMBULATORY_CARE_PROVIDER_SITE_OTHER): Payer: Medicare Other

## 2023-01-24 DIAGNOSIS — I442 Atrioventricular block, complete: Secondary | ICD-10-CM | POA: Diagnosis not present

## 2023-01-26 NOTE — Telephone Encounter (Signed)
If he agrees, we could try Victoza 1.2 mg before breakfast, and then increase to 1.8 mg before breakfast, but this is a daily medication.  It is in the same class with Trulicity and Ozempic, though.

## 2023-01-26 NOTE — Telephone Encounter (Signed)
FYI,

## 2023-01-26 NOTE — Telephone Encounter (Signed)
LMTRC  JMiller,RMA 

## 2023-01-27 DIAGNOSIS — H401123 Primary open-angle glaucoma, left eye, severe stage: Secondary | ICD-10-CM | POA: Diagnosis not present

## 2023-01-27 DIAGNOSIS — H5712 Ocular pain, left eye: Secondary | ICD-10-CM | POA: Diagnosis not present

## 2023-01-27 LAB — CUP PACEART REMOTE DEVICE CHECK
Battery Remaining Longevity: 17 mo
Battery Voltage: 2.9 V
Brady Statistic AP VP Percent: 0 %
Brady Statistic AP VS Percent: 0 %
Brady Statistic AS VP Percent: 0 %
Brady Statistic AS VS Percent: 0 %
Brady Statistic RA Percent Paced: 0 %
Brady Statistic RV Percent Paced: 99.98 %
Date Time Interrogation Session: 20240706001807
HighPow Impedance: 69 Ohm
Implantable Lead Connection Status: 753985
Implantable Lead Connection Status: 753985
Implantable Lead Connection Status: 753985
Implantable Lead Implant Date: 20130715
Implantable Lead Implant Date: 20180614
Implantable Lead Implant Date: 20180614
Implantable Lead Location: 753858
Implantable Lead Location: 753859
Implantable Lead Location: 753860
Implantable Lead Model: 4598
Implantable Pulse Generator Implant Date: 20180614
Lead Channel Impedance Value: 1026 Ohm
Lead Channel Impedance Value: 188.1 Ohm
Lead Channel Impedance Value: 205.2 Ohm
Lead Channel Impedance Value: 218.88 Ohm
Lead Channel Impedance Value: 247.704
Lead Channel Impedance Value: 278.237
Lead Channel Impedance Value: 342 Ohm
Lead Channel Impedance Value: 342 Ohm
Lead Channel Impedance Value: 418 Ohm
Lead Channel Impedance Value: 418 Ohm
Lead Channel Impedance Value: 513 Ohm
Lead Channel Impedance Value: 532 Ohm
Lead Channel Impedance Value: 608 Ohm
Lead Channel Impedance Value: 608 Ohm
Lead Channel Impedance Value: 665 Ohm
Lead Channel Impedance Value: 760 Ohm
Lead Channel Impedance Value: 893 Ohm
Lead Channel Impedance Value: 931 Ohm
Lead Channel Pacing Threshold Amplitude: 0.5 V
Lead Channel Pacing Threshold Amplitude: 0.5 V
Lead Channel Pacing Threshold Pulse Width: 0.4 ms
Lead Channel Pacing Threshold Pulse Width: 1 ms
Lead Channel Sensing Intrinsic Amplitude: 0.625 mV
Lead Channel Sensing Intrinsic Amplitude: 12.625 mV
Lead Channel Sensing Intrinsic Amplitude: 12.625 mV
Lead Channel Setting Pacing Amplitude: 1 V
Lead Channel Setting Pacing Amplitude: 2 V
Lead Channel Setting Pacing Pulse Width: 0.4 ms
Lead Channel Setting Pacing Pulse Width: 1 ms
Lead Channel Setting Sensing Sensitivity: 0.3 mV
Zone Setting Status: 755011

## 2023-01-29 NOTE — Telephone Encounter (Signed)
LMTRC  JMiller,RMA 

## 2023-01-30 ENCOUNTER — Ambulatory Visit: Payer: Medicare Other | Admitting: Internal Medicine

## 2023-01-30 DIAGNOSIS — H35372 Puckering of macula, left eye: Secondary | ICD-10-CM | POA: Diagnosis not present

## 2023-01-30 DIAGNOSIS — H2012 Chronic iridocyclitis, left eye: Secondary | ICD-10-CM | POA: Diagnosis not present

## 2023-01-30 DIAGNOSIS — H4312 Vitreous hemorrhage, left eye: Secondary | ICD-10-CM | POA: Diagnosis not present

## 2023-01-30 DIAGNOSIS — H353132 Nonexudative age-related macular degeneration, bilateral, intermediate dry stage: Secondary | ICD-10-CM | POA: Diagnosis not present

## 2023-01-30 DIAGNOSIS — T8529XD Other mechanical complication of intraocular lens, subsequent encounter: Secondary | ICD-10-CM | POA: Diagnosis not present

## 2023-01-30 DIAGNOSIS — H43812 Vitreous degeneration, left eye: Secondary | ICD-10-CM | POA: Diagnosis not present

## 2023-01-30 DIAGNOSIS — H401131 Primary open-angle glaucoma, bilateral, mild stage: Secondary | ICD-10-CM | POA: Diagnosis not present

## 2023-01-30 DIAGNOSIS — H20042 Secondary noninfectious iridocyclitis, left eye: Secondary | ICD-10-CM | POA: Diagnosis not present

## 2023-01-30 DIAGNOSIS — H43811 Vitreous degeneration, right eye: Secondary | ICD-10-CM | POA: Diagnosis not present

## 2023-01-30 MED ORDER — LIRAGLUTIDE 18 MG/3ML ~~LOC~~ SOPN: 1.8000 mg | PEN_INJECTOR | Freq: Every day | SUBCUTANEOUS | 2 refills | Status: DC

## 2023-01-30 NOTE — Addendum Note (Signed)
Addended by: Pollie Meyer on: 01/30/2023 12:07 PM   Modules accepted: Orders

## 2023-02-01 DIAGNOSIS — R3914 Feeling of incomplete bladder emptying: Secondary | ICD-10-CM | POA: Diagnosis not present

## 2023-02-01 DIAGNOSIS — N401 Enlarged prostate with lower urinary tract symptoms: Secondary | ICD-10-CM | POA: Diagnosis not present

## 2023-02-08 ENCOUNTER — Ambulatory Visit (INDEPENDENT_AMBULATORY_CARE_PROVIDER_SITE_OTHER): Payer: Medicare Other | Admitting: Internal Medicine

## 2023-02-08 ENCOUNTER — Encounter: Payer: Self-pay | Admitting: Internal Medicine

## 2023-02-08 VITALS — BP 124/80 | HR 86 | Ht 71.0 in | Wt 230.6 lb

## 2023-02-08 DIAGNOSIS — E119 Type 2 diabetes mellitus without complications: Secondary | ICD-10-CM

## 2023-02-08 DIAGNOSIS — Z794 Long term (current) use of insulin: Secondary | ICD-10-CM

## 2023-02-08 DIAGNOSIS — E785 Hyperlipidemia, unspecified: Secondary | ICD-10-CM

## 2023-02-08 DIAGNOSIS — E1159 Type 2 diabetes mellitus with other circulatory complications: Secondary | ICD-10-CM

## 2023-02-08 DIAGNOSIS — Z7985 Long-term (current) use of injectable non-insulin antidiabetic drugs: Secondary | ICD-10-CM

## 2023-02-08 DIAGNOSIS — Z7984 Long term (current) use of oral hypoglycemic drugs: Secondary | ICD-10-CM | POA: Diagnosis not present

## 2023-02-08 DIAGNOSIS — E1165 Type 2 diabetes mellitus with hyperglycemia: Secondary | ICD-10-CM | POA: Diagnosis not present

## 2023-02-08 LAB — POCT GLYCOSYLATED HEMOGLOBIN (HGB A1C): Hemoglobin A1C: 7.1 % — AB (ref 4.0–5.6)

## 2023-02-08 MED ORDER — SEMAGLUTIDE (1 MG/DOSE) 4 MG/3ML ~~LOC~~ SOPN: 1.0000 mg | PEN_INJECTOR | SUBCUTANEOUS | 0 refills | Status: DC

## 2023-02-08 MED ORDER — TRULICITY 3 MG/0.5ML ~~LOC~~ SOAJ: 3.0000 mg | SUBCUTANEOUS | 3 refills | Status: DC

## 2023-02-08 NOTE — Progress Notes (Signed)
Patient ID: Mason [name], male   DOB: [birth year], 82 y.o.   MRN: [id number]   HPI: Mason [name] is a 82 y.o.-year-old male, initially referred by his PCP, Dr. Milinda Cave, returning for follow-up for DM2, dx in ~2011, insulin-dependent since 2019, uncontrolled, with long-term complications (CAD with ?h/o AMI, nonischemic CMP, CHF, Afib, cerebrovascular disease with h/o TIA, CKD stage III, peripheral neuropathy, ED).  Last visit 4 months ago.  He is late 2 hours for his appointment as he thought that the appointment was later.  Interim history: No increased urination, nausea, chest pain.  He has back pain.   He mentions lack of interest and motivation.  Reviewed HbA1c levels:  Lab Results  Component Value Date   HGBA1C 7.1 (A) 09/29/2022   HGBA1C 6.6 (A) 05/31/2022   HGBA1C 7.3 (A) 01/26/2022   HGBA1C 7.2 (A) 09/08/2021   HGBA1C 6.7 (A) 12/09/2020   HGBA1C 7.6 (A) 09/09/2020   HGBA1C 9.7 (H) 06/22/2020   HGBA1C 9.0 (H) 03/23/2020   HGBA1C 9.2 (H) 03/16/2020   HGBA1C 9.9 (H) 11/20/2019   HGBA1C 9.7 (H) 05/27/2019   HGBA1C 9.0 (H) 02/26/2019   HGBA1C 10.1 (H) 11/29/2018   HGBA1C 9.0 (A) 08/29/2018   HGBA1C 9.0 08/29/2018   HGBA1C 9.0 (A) 08/29/2018   HGBA1C 9.0 (A) 08/29/2018   HGBA1C 7.8 (A) 05/14/2018   HGBA1C 8.1 (H) 02/06/2018   HGBA1C 8.0 (H) 10/26/2017   HGBA1C 8.2 (H) 07/27/2017   HGBA1C 7.0 03/01/2017   HGBA1C 7.1 11/16/2016   HGBA1C 7.8 (H) 07/03/2016   HGBA1C 6.8 03/14/2016   HGBA1C 7.0 (H) 12/09/2015   HGBA1C 7.4 (H) 09/20/2015   HGBA1C 7.2 (H) 03/25/2015   HGBA1C 7.5 (H) 10/15/2014   HGBA1C 7.1 (H) 06/15/2014  04/11/2021: HbA1c 7.1%  At last visit he was on: - Jardiance 10 mg before breakfast - Trulicity 1.5 >> 3  mg weekly - NovoLog pens :  14-18 >> 18-20 >> 15-16 >> 15 >> 10 >> but taking 12 units before brunch 8-10 units before dinner >> 0 units >> 7-8 >> but taking 10 units before a larger dinner  If you have a snack at night, you may need ~5 units before the  snack - Lantus vial 44 >> 40 >> 36-40 >> 42 >> 45 >> 40 >> but taking 42 units at bedtime She was on metformin in the past.  We changed to: - Jardiance 10 mg before breakfast - Trulicity 3 mg weekly >> off 2/2 Sport and exercise psychologist >>  tried: Victoza 1.8 mg before breakfast - not covered - Lantus 36 >> 42 units at bedtime - NovoLog  10 >> 12 units before brunch 6-10 >> forgets before dinner If you have a snack at night, you may need ~5 units before the snack  Pt checks his sugars more than 4 times a day with his freestyle libre CGM:  Previously:  Previously:   Lowest sugar was: 44 >> 44; he has hypoglycemia awareness at 70.  Highest sugar was 400 >> ... 200s >> 309.  Glucometer: Freestyle  Pt's meals are: - Breakfast: cereals, eggs, grits, toast - Lunch: may skip or sandwich - Dinner: meat + veggies + starch - Snacks: apple/cheese >> bananasplits at night Patient saw nutrition a long time ago.  -+ Stage III CKD, last BUN/creatinine:  Lab Results  Component Value Date   BUN 22 12/04/2022   BUN 19 09/11/2022   CREATININE 1.52 (H) 12/04/2022   CREATININE 1.40 09/11/2022  04/11/2021: 31/1.86,  GFR 37, glucose 133 On Entresto.  -+ HL; last set of lipids: Lab Results  Component Value Date   CHOL 132 09/11/2022   HDL 45.80 09/11/2022   LDLCALC 54 09/11/2022   LDLDIRECT 103.0 06/22/2020   TRIG 161.0 (H) 09/11/2022   CHOLHDL 3 09/11/2022  04/11/2021: 141/214/45/53 Previously on Zocor 20, but changed to Crestor 20 mg daily.  - last eye exam was 01/11/2023:+ DR. He also has glaucoma.  - + numbness and tingling in his feet.  On Neurontin 300 mg twice a day.   Seeing podiatry - Dr. Yetta Barre at the [facility].  No known FH of DM.  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 Vitamin B12 level: 04/11/2021: 429  Drinks beer 2-3x a day, but not quite every day.  ROS: + see HPI  I reviewed  pt's medications, allergies, PMH, social hx, family hx, and changes were documented in the history of present illness. Otherwise, unchanged from my initial visit note.  Past Medical History:  Diagnosis Date   AICD (automatic cardioverter/defibrillator) present 2018   MDT CRT-D.  Fatigue-->completely pacer dependent.  Pacer settings adjusted 12/2017 to allow more chronotrophc variance with ADL's//exertion.   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   02/2016 EF 40%, diffuse hypokinesis, grade 2 DD.  Myoc perf imaging showed EF 32% 04/2016.  Pt upgraded to CRT-D 01/04/17.   Chronic renal insufficiency, stage III (moderate) (HCC) 2015   CrCl about 60 ml/min   Complete heart block (HCC)    Has dual chamber pacer.   COVID-19 virus infection 01/05/2021   paxlovid   Depression    DOE (dyspnea on exertion)    NYHA class II/III CHF   Dyspnea 2021   with exertion, bending over   Episodic low back pain 01/22/2013   w/intermittent radiculitis (12/2014 his neurologist referred him to pain mgmt for epidural steroid injection)   Erectile dysfunction 2019   due to zoloft--urol rx'd viagra   GERD (gastroesophageal reflux disease)    H/O tilt table evaluation 11/02/2005   negative   Helicobacter pylori gastritis 01/2016   History of adenomatous polyp of colon 10/12/2011   Dr. Elnoria Howard (3 right side of colon- tubular adenomas removed)   History of cardiovascular stress test 05/28/2012   no ischemia, EF 37%, imaging results are unchanged and within normal variance   History of chronic prostatitis    History of kidney stones    History of vertigo    + Hx of posterior HA's.  Neuro (Dr. Sandria Manly) eval 2011.   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   Hyperlipidemia    Hypertension    Lumbar spondylosis    lumbosacral radiculopathy at L4 by EMG testing, right foot drop (neurologist is Dr. Luberta Robertson with [facility] in [place])--neurologist referred him to neurosurgery   Migraine    "used to have them all the time; none for years" (01/04/2017)   Myocardial infarction [facility]) ?1970s   not entirely certain of this   Nephrolithiasis 07/2012   Left UVJ 2 mm stone with dilation of  renal collecting system and slight hydroureter on right   Neuropathy    NICM (nonischemic cardiomyopathy) (HCC)    a. 02/2018 Cath: LM nl, LAD min irregs, LCX no, RCA 20d. [id number]. Fick CO/CI 4.4/2.0.   Osteoarthritis, multiple sites    Shoulders, back, knees   Pacemaker 02/05/2012   dual chamber, complete heart block, meddtronic revo, lasted checked 12/2015.  Since no CAD on cath 05/2016, cards recommends upgrade to CRT-D.   Permanent atrial fibrillation (HCC)    DCCV 07/09/13-converted, lasted two days, then back into afib--needs lifetime anticoagulation (Xarelto as of 09/2014)   Prostate cancer screening 09/2017   done by urol annually (normal prostate exam documented + PSA 0.84 as of 10/01/17 urol f/u.  10/2018 urol f/u PSA 0.6, no prostate nodule.   Rectus diastasis    Right ankle sprain 08/2017   w/distal fibula avulsion fx noted on u/s but not plain film-(Dr. Hudnall).   Skin cancer of arm, left    "burned it off" (01/04/2017)   TIA (transient ischemic attack)    L face and L arm weakness. Peri procedural->a. 03/22/2018 following cath. CT head neg. No MRI b/c has pacer. Likely due to embolus to distal branch of RMCA   Type II diabetes mellitus (HCC)    Past Surgical History:  Procedure Laterality Date   ABI's Bilateral 05/21/2018   normal   BACK SURGERY     BIV ICD INSERTION CRT-D N/A 01/04/2017   Procedure: BiV ICD ;  Surgeon:  Regan Lemming, MD;  Location: [facility] INVASIVE CV LAB;  Service: Cardiovascular;  Laterality: N/A;   CARDIAC CATHETERIZATION N/A 06/14/2016   Minimal nonobstructive dz, EF 25-35%.  Procedure: Left Heart Cath and Coronary Angiography;  Surgeon: Peter M Swaziland, MD;  Location: [facility] INVASIVE CV LAB;  Service: Cardiovascular;  Laterality: N/A;   CARDIOVASCULAR STRESS TEST  2012   2012 nuclear perfusion study: low risk scan; 04/2016 normal myocardial perfusion imaging, EF 32%.   CARDIOVERSION  07/09/2012   Procedure: CARDIOVERSION;  Surgeon: Thurmon Fair, MD;  Location: [facility] ENDOSCOPY;  Service: Cardiovascular;  Laterality: N/A;   CATARACT EXTRACTION W/ INTRAOCULAR LENS IMPLANT & ANTERIOR VITRECTOMY, BILATERAL Bilateral    CIRCUMCISION N/A 03/23/2020   Procedure: CIRCUMCISION ADULT;  Surgeon: Belva Agee, MD;  Location: [facility] ORS;  Service: Urology;  Laterality: N/A;   COLONOSCOPY W/ POLYPECTOMY  approx 2006; repeated 09/2011   Polyps on 2013 EGD as well, repeat 12/2014   COLONOSCOPY WITH PROPOFOL N/A 07/08/2021   adenoma x 1. Procedure: COLONOSCOPY WITH PROPOFOL;  Surgeon: Jeani Hawking, MD;  Location: [facility] ENDOSCOPY;  Service: Endoscopy;  Laterality: N/A;   ESOPHAGOGASTRODUODENOSCOPY  10/18/2006   Done due to chronic GERD: Normal, bx showed no barrett's esophagus (Dr. Elnoria Howard)   EYE SURGERY  01/08/2023   FLEXOR TENDON REPAIR Left 10/02/2016   Procedure: LEFT RING FINGER WOUND EXPORATION AND FLEXOR TENDON REPAIR AND NERVE REPAIR;  Surgeon: Mack Hook, MD;  Location: [facility] OR;  Service: Orthopedics;  Laterality: Left;   INSERT / REPLACE / REMOVE PACEMAKER  02/05/2012   dual chamber, sinus node dysfunction, sinus arrest, PAF, Medtronic Revo serial#-[id number] H: last checked 05/2015   LUMBAR LAMINECTOMY Left 1976   L4-5   PACEMAKER REMOVAL  01/04/2017   PERMANENT PACEMAKER INSERTION N/A 02/05/2012   Procedure: PERMANENT PACEMAKER INSERTION;  Surgeon: Thurmon Fair, MD; Generator Medtronic [place] model  [place] serial number [id number] H Laterality: N/A;   POLYPECTOMY  07/08/2021   Procedure: POLYPECTOMY;  Surgeon: Jeani Hawking, MD;  Location: [facility]  END[facility];  Service: Endoscopy;;   RETINAL DETACHMENT SURGERY Left ~ 1999   REVERSE SHOULDER ARTHROPLASTY Left 2018   Left shoulder reverse TSA ([facility] in [place]).   RIGHT/LEFT HEART CATH AND CORONARY ANGIOGRAPHY N/A 03/22/2018   EF 30-35%, no CAD.  Procedure: RIGHT/LEFT HEART CATH AND CORONARY ANGIOGRAPHY;  Surgeon: Dolores Patty, MD;  Location: [facility] INVASIVE CV LAB;  Service: Cardiovascular;  Laterality: N/A;   TRANSTHORACIC ECHOCARDIOGRAM  08/25/10; 05/2012; 03/23/16;12/2017   mild asymmetric LVH, normal systolic function, normal diastolic fxn, mild-to-mod mitral regurg, mild aortic valve sclerosis and trace AI, mild aortic root dilatation. 2014 f/u showed EF 40-45%, mod LAE, A FIB.  02/2016 EF 40%, diffuse hypokinesis, grade 2 DD. 12/2017 EF 35-40%,diffuse hypokin,grd III DD, mild MR   WISDOM TOOTH EXTRACTION  06/27/2021   EF 50-55%, mild aortic root/ascending aorta dilatation (41-42 mm).   Social History   Socioeconomic History   Marital status: Married    Spouse name: Not on file   Number of children: Not on file   Years of education: Not on file   Highest education level: 12th grade  Occupational History   Not on file  Tobacco Use   Smoking status: Never   Smokeless tobacco: Never  Vaping Use   Vaping status: Never Used  Substance and Sexual Activity   Alcohol use: Yes    Comment: occ   Drug use: No   Sexual activity: Not on file  Other Topics Concern   Not on file  Social History Narrative   Not on file   Social Determinants of Health   Financial Resource Strain: Low Risk  (09/10/2022)   Overall Financial Resource Strain (CARDIA)    Difficulty of Paying Living Expenses: Not hard at all  Food Insecurity: No Food Insecurity (09/10/2022)   Hunger Vital Sign    Worried About Running Out of Food in the Last Year: Never  true    Ran Out of Food in the Last Year: Never true  Transportation Needs: No Transportation Needs (09/10/2022)   PRAPARE - Administrator, Civil Service (Medical): No    Lack of Transportation (Non-Medical): No  Physical Activity: Inactive (09/10/2022)   Exercise Vital Sign    Days of Exercise per Week: 0 days    Minutes of Exercise per Session: 0 min  Stress: Stress Concern Present (09/10/2022)   [employer] of Occupational Health - Occupational Stress Questionnaire    Feeling of Stress : To some extent  Social Connections: Socially Integrated (09/10/2022)   Social Connection and Isolation Panel [NHANES]    Frequency of Communication with Friends and Family: More than three times a week    Frequency of Social Gatherings with Friends and Family: More than three times a week    Attends Religious Services: More than 4 times per year    Active Member of [employer] or Organizations: Yes    Attends Engineer, structural: More than 4 times per year    Marital Status: Married  Catering manager Violence: Not At Risk (09/01/2022)   Humiliation, Afraid, Rape, and Kick questionnaire    Fear of Current or Ex-Partner: No    Emotionally Abused: No    Physically Abused: No    Sexually Abused: No   Current Outpatient Medications on File Prior to Visit  Medication Sig Dispense Refill   acetaminophen (TYLENOL) 325 MG tablet Take 650 mg by mouth every 6 (six) hours as needed for moderate pain.  BD INSULIN SYRINGE U/F 31G X 5/16" 1 ML MISC USE DAILY AS DIRECTED 100 each 3   COMBIGAN 0.2-0.5 % ophthalmic solution Place 1 drop into both eyes at bedtime.      Continuous Blood Gluc Receiver (FREESTYLE LIBRE 2 READER) DEVI 1 each by Does not apply route daily. 1 each 3   Continuous Blood Gluc Sensor (FREESTYLE LIBRE 2 SENSOR) MISC 1 each by Does not apply route every 14 (fourteen) days. 6 each 3   diclofenac Sodium (VOLTAREN) 1 % GEL Apply 4 g topically 4 (four) times daily. 100 g 1    Dulaglutide (TRULICITY) 3 MG/0.5ML SOPN Inject 3 mg into the skin once a week. 6 mL 3   ENTRESTO 97-103 MG TAKE 1 TABLET TWO TIMES A DAY 180 tablet 2   eplerenone (INSPRA) 25 MG tablet TAKE 1 TABLET DAILY 90 tablet 3   gabapentin (NEURONTIN) 300 MG capsule Take 2 capsules (600 mg total) by mouth 2 (two) times daily. 360 capsule 1   insulin aspart (NOVOLOG FLEXPEN) 100 UNIT/ML FlexPen INJECT 6-8 UNITS UNDER THE SKIN AT THE TIME OF EACH MEAL (Patient taking differently: Pt stated 20 units in the a.m only   UNDER THE SKIN AT THE TIME OF EACH MEAL) 15 mL 4   JARDIANCE 10 MG TABS tablet TAKE 1 TABLET DAILY 90 tablet 3   ketoconazole (NIZORAL) 2 % cream Apply 1 Application topically daily.     ketorolac (ACULAR) 0.5 % ophthalmic solution      LANTUS 100 UNIT/ML injection INJECT 40 TO 45 UNITS TOTAL UNDER THE SKIN DAILY (Patient taking differently: 42 Units daily.) 40 mL 3   lidocaine (LIDODERM) 5 % PLACE 1 PATCH ON THE SKIN DAILY. REMOVE AND DISCARD PATCH WITHIN 12 HOURS OR AS DIRECTED BY DOCTOR 90 patch 0   liraglutide (VICTOZA) 18 MG/3ML SOPN Inject 1.8 mg into the skin daily. 27 mL 2   meclizine (ANTIVERT) 25 MG tablet TAKE 1 TABLET THREE TIMES A DAY AS NEEDED FOR DIZZINESS 270 tablet 0   metoprolol succinate (TOPROL-XL) 50 MG 24 hr tablet TAKE 1 TABLET DAILY. TAKE WITH OR IMMEDIATELY FOLLOWING A MEAL Strength: 50 mg 90 tablet 1   omeprazole (PRILOSEC) 40 MG capsule 1 cap po bid 180 capsule 1   oxybutynin (DITROPAN XL) 10 MG 24 hr tablet Take 1 tablet (10 mg total) by mouth at bedtime. 90 tablet 1   oxyCODONE-acetaminophen (PERCOCET) 10-325 MG tablet 1/2-1 tab po tid prn pain 30 tablet 0   rivaroxaban (XARELTO) 20 MG TABS tablet TAKE 1 TABLET DAILY WITH SUPPER 90 tablet 1   ROCKLATAN 0.02-0.005 % SOLN Apply to eye.     rOPINIRole (REQUIP) 1 MG tablet Take 1 tablet (1 mg total) by mouth daily. 90 tablet 0   rosuvastatin (CRESTOR) 20 MG tablet Take 1 tablet (20 mg total) by mouth daily. 90 tablet 0    Semaglutide, 1 MG/DOSE, 4 MG/3ML SOPN Inject 1 mg as directed once a week. 3 mL 0   sertraline (ZOLOFT) 100 MG tablet Take 1 tablet (100 mg total) by mouth daily. 90 tablet 0   SURE COMFORT PEN NEEDLES 31G X 5 MM MISC USE TO INJECT INSULIN UNDER THE SKIN 100 each 11   No current facility-administered medications on file prior to visit.   No Known Allergies Family History  Problem Relation Age of Onset   Heart failure Mother    Stroke Mother    Stroke Father    Heart disease Sister  Heart disease Sister    Cancer Sister        liver   Cancer Brother        lung   Cancer Brother        lung   Heart disease Brother    PE: BP 124/80   Pulse 86   Ht 5\' 11"  (1.803 m)   Wt 230 lb 9.6 oz (104.6 kg)   SpO2 99%   BMI 32.16 kg/m  Wt Readings from Last 3 Encounters:  02/08/23 230 lb 9.6 oz (104.6 kg)  01/15/23 229 lb 9.6 oz (104.1 kg)  12/27/22 229 lb (103.9 kg)   Constitutional: overweight, in NAD Eyes: EOMI, no exophthalmos ENT: no thyromegaly, no cervical lymphadenopathy Cardiovascular: RRR, No MRG Respiratory: CTA B Musculoskeletal: no deformities Skin: no rashes Neurological: + tremor with outstretched hands  ASSESSMENT: 1. DM2, insulin-dependent, uncontrolled, with complications: - CAD with ?h/o AMI - nonischemic CMP - CHF, s/p AICD - Afib, s/p pacemaker - cerebrovascular disease with h/o TIA - CKD stage III - peripheral neuropathy - ED  No family history of medullary thyroid cancer or personal history of pancreatitis.  2. HL  PLAN:  1. Patient with longstanding, uncontrolled, type 2 diabetes, on basal-bolus insulin regimen along with SGLT2 inhibitor and now daily GLP-1 receptor agonist, changed from weekly since last visit.  He contacted [place] with Trulicity being on national backorder so we had to change to Victoza since last visit.  At last visit, sugars were within target range during the day with occasional spikes and occasional slightly lower blood sugars  than 70s, but increasing after 7 PM and then dropping slowly from 12 AM with a nadir around 7-9 in the morning.  At that time, we decreased his Lantus dose but added NovoLog consistently before his dinners.  I previously advised him to try not to eat after dark. -HbA1c at last visit was 7.1%, higher CGM interpretation: -At today's visit, we reviewed his CGM downloads: It appears that 63% of values are in target range (goal >70%), while 34% are higher than 180 (goal <25%), and 3% are lower than 70 (goal <4%).  The calculated average blood sugar is 157.  The projected HbA1c for the next 3 months (GMI) is 7.1%. -Reviewing the CGM trends, sugars are increasing after certain meals but more consistently after dinner.  They may drop too low in the early morning.  Upon questioning, he increase his Lantus dose since last visit and at today's visit I advised him to decrease this.  He is also taking more insulin before brunch which we could continue for now, but he is forgetting to take the NovoLog before dinner and we discussed that it is important that he took it consistently.  He was not able to refill his Trulicity due to national shortage Ozempic and Victoza were sent to his pharmacy but these are expensive.  At today's visit, we gave him 1 month sample of Trulicity and we discussed about different options: He can try to refill Trulicity from the [place] pharmacy if available-a prescription was printed for him We sent a prescription for Ozempic to his mail order pharmacy to see if he can get the cheaper We gave him paperwork for the Ozempic patient assistance program with Novo - I suggested to:  Patient Instructions  Please continue: - Jardiance 10 mg before breakfast  Decrease: - Lantus 38 units at bedtime - NovoLog  10-12 units before brunch 6-10 units before dinner If you have a  snack at night, you may need ~5 units before the snack    Please try to restart:  - Trulicity 3 mg weekly or Ozempic 1 mg  weekly  Please return in 4 months.   - we checked his HbA1c: 7.1% (stable) - advised to check sugars at different times of the day - 4x a day, rotating check times - advised for yearly eye exams >> he is UTD - At last visits we discussed about possibly trying alpha-lipoic acid for numbness in his feet -he did not try this yet.  He was already on Neurontin and ropinirole.  He has twice a year foot exam at the [place].  I do not have these records beyond a nurse evaluation from 08/2022.  I advised him to have these records sent to me. - return to clinic in 4 months  2. HL -Latest lipid panel from 08/2022: Fractions at goal: Lab Results  Component Value Date   CHOL 132 09/11/2022   HDL 45.80 09/11/2022   LDLCALC 54 09/11/2022   LDLDIRECT 103.0 06/22/2020   TRIG 161.0 (H) 09/11/2022   CHOLHDL 3 09/11/2022  -He is on Crestor 20 mg daily without side effects  Carlus Pavlov, MD PhD [facility] Endocrinology

## 2023-02-08 NOTE — Patient Instructions (Addendum)
Please continue: - Jardiance 10 mg before breakfast  Decrease: - Lantus 38 units at bedtime - NovoLog  10-12 units before brunch 6-10 units before dinner If you have a snack at night, you may need ~5 units before the snack    Please try to restart:  - Trulicity 3 mg weekly or Ozempic 1 mg weekly  Please return in 4 months.

## 2023-02-12 ENCOUNTER — Other Ambulatory Visit: Payer: Self-pay

## 2023-02-12 DIAGNOSIS — Z794 Long term (current) use of insulin: Secondary | ICD-10-CM

## 2023-02-12 MED ORDER — NOVOLOG FLEXPEN 100 UNIT/ML ~~LOC~~ SOPN
PEN_INJECTOR | SUBCUTANEOUS | Status: DC
Start: 2023-02-12 — End: 2023-02-21

## 2023-02-15 ENCOUNTER — Other Ambulatory Visit: Payer: Self-pay | Admitting: Family Medicine

## 2023-02-15 NOTE — Progress Notes (Signed)
Remote ICD transmission.   

## 2023-02-19 ENCOUNTER — Other Ambulatory Visit: Payer: Self-pay | Admitting: Family Medicine

## 2023-02-19 DIAGNOSIS — M5414 Radiculopathy, thoracic region: Secondary | ICD-10-CM

## 2023-02-20 ENCOUNTER — Other Ambulatory Visit: Payer: Self-pay | Admitting: Family Medicine

## 2023-02-21 ENCOUNTER — Other Ambulatory Visit: Payer: Self-pay

## 2023-02-21 DIAGNOSIS — Z794 Long term (current) use of insulin: Secondary | ICD-10-CM

## 2023-02-21 MED ORDER — NOVOLOG FLEXPEN 100 UNIT/ML ~~LOC~~ SOPN
PEN_INJECTOR | SUBCUTANEOUS | Status: DC
Start: 2023-02-21 — End: 2023-02-26

## 2023-02-23 ENCOUNTER — Other Ambulatory Visit: Payer: Self-pay

## 2023-02-23 MED ORDER — SEMAGLUTIDE (1 MG/DOSE) 4 MG/3ML ~~LOC~~ SOPN: 1.0000 mg | PEN_INJECTOR | SUBCUTANEOUS | 3 refills | Status: DC

## 2023-02-26 ENCOUNTER — Other Ambulatory Visit: Payer: Self-pay

## 2023-02-26 DIAGNOSIS — E119 Type 2 diabetes mellitus without complications: Secondary | ICD-10-CM

## 2023-02-26 MED ORDER — NOVOLOG FLEXPEN 100 UNIT/ML ~~LOC~~ SOPN: 10.0000 [IU] | PEN_INJECTOR | Freq: Three times a day (TID) | SUBCUTANEOUS | 2 refills | Status: DC

## 2023-03-02 ENCOUNTER — Other Ambulatory Visit: Payer: Self-pay

## 2023-03-02 DIAGNOSIS — E119 Type 2 diabetes mellitus without complications: Secondary | ICD-10-CM

## 2023-03-02 MED ORDER — NOVOLOG FLEXPEN 100 UNIT/ML ~~LOC~~ SOPN: 10.0000 [IU] | PEN_INJECTOR | Freq: Three times a day (TID) | SUBCUTANEOUS | 3 refills | Status: DC

## 2023-03-06 ENCOUNTER — Other Ambulatory Visit: Payer: Self-pay

## 2023-03-06 DIAGNOSIS — H20042 Secondary noninfectious iridocyclitis, left eye: Secondary | ICD-10-CM | POA: Diagnosis not present

## 2023-03-06 DIAGNOSIS — H401131 Primary open-angle glaucoma, bilateral, mild stage: Secondary | ICD-10-CM | POA: Diagnosis not present

## 2023-03-06 DIAGNOSIS — H35372 Puckering of macula, left eye: Secondary | ICD-10-CM | POA: Diagnosis not present

## 2023-03-06 DIAGNOSIS — H43813 Vitreous degeneration, bilateral: Secondary | ICD-10-CM | POA: Diagnosis not present

## 2023-03-06 DIAGNOSIS — H353132 Nonexudative age-related macular degeneration, bilateral, intermediate dry stage: Secondary | ICD-10-CM | POA: Diagnosis not present

## 2023-03-06 MED ORDER — EMPAGLIFLOZIN 10 MG PO TABS: 10.0000 mg | ORAL_TABLET | Freq: Every day | ORAL | 0 refills | Status: DC

## 2023-03-06 NOTE — Telephone Encounter (Signed)
Per patient request he needs 10 day supply until [facility] can get it to him.

## 2023-03-07 ENCOUNTER — Other Ambulatory Visit: Payer: Self-pay

## 2023-03-07 DIAGNOSIS — E119 Type 2 diabetes mellitus without complications: Secondary | ICD-10-CM

## 2023-03-07 MED ORDER — NOVOLOG FLEXPEN 100 UNIT/ML ~~LOC~~ SOPN: PEN_INJECTOR | SUBCUTANEOUS | 3 refills | Status: DC

## 2023-03-08 ENCOUNTER — Encounter (INDEPENDENT_AMBULATORY_CARE_PROVIDER_SITE_OTHER): Payer: Self-pay

## 2023-03-12 ENCOUNTER — Other Ambulatory Visit: Payer: Self-pay | Admitting: Family Medicine

## 2023-03-16 ENCOUNTER — Ambulatory Visit: Payer: Medicare Other | Admitting: Internal Medicine

## 2023-03-16 NOTE — Progress Notes (Deleted)
Patient ID: Mason [name], male   DOB: [birth year], 82 y.o.   MRN: [id number]   HPI: Mason [name] is a 82 y.o.-year-old male, initially referred by his PCP, Dr. Milinda Cave, returning for follow-up for DM2, dx in ~2011, insulin-dependent since 2019, uncontrolled, with long-term complications (CAD with ?h/o AMI, nonischemic CMP, CHF, Afib, cerebrovascular disease with h/o TIA, CKD stage III, peripheral neuropathy, ED).  Last visit 4 months ago.  He is late 2 hours for his appointment as he thought that the appointment was later.  Interim history: No increased urination, nausea, chest pain.  He has back pain.   He mentions lack of interest and motivation.  Reviewed HbA1c levels:   Lab Results  Component Value Date   HGBA1C 7.1 (A) 09/29/2022   HGBA1C 6.6 (A) 05/31/2022   HGBA1C 7.3 (A) 01/26/2022   HGBA1C 7.2 (A) 09/08/2021   HGBA1C 6.7 (A) 12/09/2020   HGBA1C 7.6 (A) 09/09/2020   HGBA1C 9.7 (H) 06/22/2020   HGBA1C 9.0 (H) 03/23/2020   HGBA1C 9.2 (H) 03/16/2020   HGBA1C 9.9 (H) 11/20/2019   HGBA1C 9.7 (H) 05/27/2019   HGBA1C 9.0 (H) 02/26/2019   HGBA1C 10.1 (H) 11/29/2018   HGBA1C 9.0 (A) 08/29/2018   HGBA1C 9.0 08/29/2018   HGBA1C 9.0 (A) 08/29/2018   HGBA1C 9.0 (A) 08/29/2018   HGBA1C 7.8 (A) 05/14/2018   HGBA1C 8.1 (H) 02/06/2018   HGBA1C 8.0 (H) 10/26/2017   HGBA1C 8.2 (H) 07/27/2017   HGBA1C 7.0 03/01/2017   HGBA1C 7.1 11/16/2016   HGBA1C 7.8 (H) 07/03/2016   HGBA1C 6.8 03/14/2016   HGBA1C 7.0 (H) 12/09/2015   HGBA1C 7.4 (H) 09/20/2015   HGBA1C 7.2 (H) 03/25/2015   HGBA1C 7.5 (H) 10/15/2014   HGBA1C 7.1 (H) 06/15/2014  04/11/2021: HbA1c 7.1%  He was previously on: - Jardiance 10 mg before breakfast - Trulicity 1.5 >> 3  mg weekly - NovoLog pens :  14-18 >> 18-20 >> 15-16 >> 15 >> 10 >> but taking 12 units before brunch 8-10 units before dinner >> 0 units >> 7-8 >> but taking 10 units before a larger dinner  If you have a snack at night, you may need ~5 units before the  snack - Lantus vial 44 >> 40 >> 36-40 >> 42 >> 45 >> 40 >> but taking 42 units at bedtime He was on metformin in the past.  Now on: - Jardiance 10 mg before breakfast - Trulicity 3 mg weekly >> off 2/2 Sport and exercise psychologist >>  tried: Victoza 1.8 mg before breakfast - not covered - Lantus 36 >> 42 >> 38 units at bedtime - NovoLog  10 >> 12 >> 10-12 units before brunch 6-10 before dinner If you have a snack at night, you may need ~5 units before the snack  Pt checks his sugars more than 4 times a day with his freestyle libre CGM:   Previously:  Previously:   Lowest sugar was: 44 >> 44; he has hypoglycemia awareness at 70.  Highest sugar was 400 >> ... 200s >> 309.  Glucometer: Freestyle  Pt's meals are: - Breakfast: cereals, eggs, grits, toast - Lunch: may skip or sandwich - Dinner: meat + veggies + starch - Snacks: apple/cheese >> bananasplits at night Patient saw nutrition a long time ago.  -+ Stage III CKD, last BUN/creatinine:  [facility]/20/[facility]):  Lab Results  Component Value Date   BUN 22 12/04/2022   BUN 19 09/11/2022   CREATININE 1.52 (H) 12/04/2022   CREATININE 1.40  09/11/2022  04/11/2021: 31/1.86, GFR 37, glucose 133  Lab Results  Component Value Date   MICRALBCREAT 0.9 12/04/2022   MICRALBCREAT 1.0 11/20/2019   MICRALBCREAT 1.4 02/06/2018   MICRALBCREAT 0.7 11/16/2016   MICRALBCREAT 0.5 10/15/2014   MICRALBCREAT 0.1 10/14/2013  On Entresto.  -+ HL; last set of lipids: 02/20/2023 ([facility]):  Lab Results  Component Value Date   CHOL 132 09/11/2022   HDL 45.80 09/11/2022   LDLCALC 54 09/11/2022   LDLDIRECT 103.0 06/22/2020   TRIG 161.0 (H) 09/11/2022   CHOLHDL 3 09/11/2022  04/11/2021: 141/214/45/53 Previously on Zocor 20, but changed to Crestor 20 mg daily.  - last eye exam was 01/11/2023:+ DR. He also has glaucoma.  - + numbness and tingling in his feet.  On Neurontin 300 mg twice a day.   Seeing podiatry - Dr. Yetta Barre at the [facility] clinic.  No known FH of  DM.  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  Other labs:    Drinks beer 2-3x a day, but not quite every day.  ROS: + see HPI  I reviewed pt's medications, allergies, PMH, social hx, family hx, and changes were documented in the history of present illness. Otherwise, unchanged from my initial visit note.  Past Medical History:  Diagnosis Date   AICD (automatic cardioverter/defibrillator) present 2018   MDT CRT-D.  Fatigue-->completely pacer dependent.  Pacer settings adjusted 12/2017 to allow more chronotrophc variance with ADL's//exertion.   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   02/2016 EF 40%, diffuse hypokinesis, grade 2 DD.  Myoc perf imaging showed EF 32% 04/2016.  Pt upgraded to CRT-D 01/04/17.   Chronic renal insufficiency, stage III (moderate) (HCC) 2015   CrCl about 60 ml/min   Complete heart block (HCC)    Has dual chamber pacer.   COVID-19 virus infection 01/05/2021   paxlovid   Depression    DOE (dyspnea on exertion)    NYHA class II/III CHF   Dyspnea 2021   with exertion, bending over   Episodic low back pain 01/22/2013   w/intermittent radiculitis (12/2014 his neurologist referred him to pain mgmt for epidural steroid injection)   Erectile dysfunction 2019   due to zoloft--urol rx'd viagra   GERD (gastroesophageal reflux disease)    H/O tilt table evaluation 11/02/2005   negative   Helicobacter pylori gastritis 01/2016   History of adenomatous polyp of colon 10/12/2011   Dr. Elnoria Howard (3 right side of colon- tubular adenomas removed)   History of cardiovascular stress  test 05/28/2012   no ischemia, EF 37%, imaging results are unchanged and within normal variance   History of chronic prostatitis    History of kidney stones    History of vertigo    + Hx of posterior HA's.  Neuro (Dr. Sandria Manly) eval 2011.  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   Hyperlipidemia    Hypertension    Lumbar spondylosis    lumbosacral radiculopathy at L4 by EMG testing, right foot drop (neurologist is Dr. Luberta Robertson with [facility] in [place])--neurologist referred him to  neurosurgery   Migraine    "used to have them all the time; none for years" (01/04/2017)   Myocardial infarction [facility]) ?1970s   not entirely certain of this   Nephrolithiasis 07/2012   Left UVJ 2 mm stone with dilation of renal collecting system and slight hydroureter on right   Neuropathy    NICM (nonischemic cardiomyopathy) (HCC)    a. 02/2018 Cath: LM nl, LAD min irregs, LCX no, RCA 20d. [id number]. Fick CO/CI 4.4/2.0.   Osteoarthritis, multiple sites    Shoulders, back, knees   Pacemaker 02/05/2012   dual chamber, complete heart block, meddtronic revo, lasted checked 12/2015.  Since no CAD on cath 05/2016, cards recommends upgrade to CRT-D.   Permanent atrial fibrillation (HCC)    DCCV 07/09/13-converted, lasted two days, then back into afib--needs lifetime anticoagulation (Xarelto as of 09/2014)   Prostate cancer screening 09/2017   done by urol annually (normal prostate exam documented + PSA 0.84 as of 10/01/17 urol f/u.  10/2018 urol f/u PSA 0.6, no prostate nodule.   Rectus diastasis    Right ankle sprain 08/2017   w/distal fibula avulsion fx noted on u/s but not plain film-(Dr. Hudnall).   Skin cancer of arm, left    "burned it off" (01/04/2017)   TIA (transient ischemic attack)    L face and L arm weakness. Peri procedural->a. 03/22/2018 following cath. CT head neg. No MRI b/c has pacer. Likely due to embolus to distal  branch of RMCA   Type II diabetes mellitus (HCC)    Past Surgical History:  Procedure Laterality Date   ABI's Bilateral 05/21/2018   normal   BACK SURGERY     BIV ICD INSERTION CRT-D N/A 01/04/2017   Procedure: BiV ICD ;  Surgeon: Regan Lemming, MD;  Location: [facility] INVASIVE CV LAB;  Service: Cardiovascular;  Laterality: N/A;   CARDIAC CATHETERIZATION N/A 06/14/2016   Minimal nonobstructive dz, EF 25-35%.  Procedure: Left Heart Cath and Coronary Angiography;  Surgeon: Peter M Swaziland, MD;  Location: [facility] INVASIVE CV LAB;  Service: Cardiovascular;  Laterality: N/A;   CARDIOVASCULAR STRESS TEST  2012   2012 nuclear perfusion study: low risk scan; 04/2016 normal myocardial perfusion imaging, EF 32%.   CARDIOVERSION  07/09/2012   Procedure: CARDIOVERSION;  Surgeon: Thurmon Fair, MD;  Location: [facility] ENDOSCOPY;  Service: Cardiovascular;  Laterality: N/A;   CATARACT EXTRACTION W/ INTRAOCULAR LENS IMPLANT & ANTERIOR VITRECTOMY, BILATERAL Bilateral    CIRCUMCISION N/A 03/23/2020   Procedure: CIRCUMCISION ADULT;  Surgeon: Belva Agee, MD;  Location: [facility] ORS;  Service: Urology;  Laterality: N/A;   COLONOSCOPY W/ POLYPECTOMY  approx 2006; repeated 09/2011   Polyps on 2013 EGD as well, repeat 12/2014   COLONOSCOPY WITH PROPOFOL N/A 07/08/2021   adenoma x 1. Procedure: COLONOSCOPY WITH PROPOFOL;  Surgeon: Jeani Hawking, MD;  Location: [facility] ENDOSCOPY;  Service: Endoscopy;  Laterality: N/A;   ESOPHAGOGASTRODUODENOSCOPY  10/18/2006   Done due to chronic GERD: Normal, bx showed no barrett's esophagus (Dr. Elnoria Howard)   EYE SURGERY  01/08/2023   FLEXOR TENDON REPAIR Left 10/02/2016   Procedure: LEFT RING FINGER WOUND EXPORATION AND FLEXOR TENDON REPAIR AND NERVE REPAIR;  Surgeon: Mack Hook, MD;  Location: [facility] OR;  Service: Orthopedics;  Laterality: Left;   INSERT / REPLACE / REMOVE PACEMAKER  02/05/2012   dual chamber, sinus node dysfunction, sinus arrest, PAF, Medtronic Revo serial#-[id number] H: last  checked 05/2015   LUMBAR LAMINECTOMY Left 1976   L4-5   PACEMAKER REMOVAL  01/04/2017  PERMANENT PACEMAKER INSERTION N/A 02/05/2012   Procedure: PERMANENT PACEMAKER INSERTION;  Surgeon: Thurmon Fair, MD; Generator Medtronic [place] model [place] serial number [id number] Laterality: N/A;   POLYPECTOMY  07/08/2021   Procedure: POLYPECTOMY;  Surgeon: Jeani Hawking, MD;  Location: [facility] ENDOSCOPY;  Service: Endoscopy;;   RETINAL DETACHMENT SURGERY Left ~ 1999   REVERSE SHOULDER ARTHROPLASTY Left 2018   Left shoulder reverse [facility] in [place]).   RIGHT/LEFT HEART CATH AND CORONARY ANGIOGRAPHY N/A 03/22/2018   EF 30-35%, no CAD.  Procedure: RIGHT/LEFT HEART CATH AND CORONARY ANGIOGRAPHY;  Surgeon: Dolores Patty, MD;  Location: [facility] INVASIVE CV LAB;  Service: Cardiovascular;  Laterality: N/A;   TRANSTHORACIC ECHOCARDIOGRAM  08/25/10; 05/2012; 03/23/16;12/2017   mild asymmetric LVH, normal systolic function, normal diastolic fxn, mild-to-mod mitral regurg, mild aortic valve sclerosis and trace AI, mild aortic root dilatation. 2014 f/u showed EF 40-45%, mod LAE, A FIB.  02/2016 EF 40%, diffuse hypokinesis, grade 2 DD. 12/2017 EF 35-40%,diffuse hypokin,grd III DD, mild MR   WISDOM TOOTH EXTRACTION  06/27/2021   EF 50-55%, mild aortic root/ascending aorta dilatation (41-42 mm).   Social History   Socioeconomic History   Marital status: Married    Spouse name: Not on file   Number of children: Not on file   Years of education: Not on file   Highest education level: 12th grade  Occupational History   Not on file  Tobacco Use   Smoking status: Never   Smokeless tobacco: Never  Vaping Use   Vaping status: Never Used  Substance and Sexual Activity   Alcohol use: Yes    Comment: occ   Drug use: No   Sexual activity: Not on file  Other Topics Concern   Not on file  Social History Narrative   Not on file   Social Determinants of Health   Financial Resource Strain: Low Risk   (09/10/2022)   Overall Financial Resource Strain (CARDIA)    Difficulty of Paying Living Expenses: Not hard at all  Food Insecurity: No Food Insecurity (09/10/2022)   Hunger Vital Sign    Worried About Running Out of Food in the Last Year: Never true    Ran Out of Food in the Last Year: Never true  Transportation Needs: No Transportation Needs (09/10/2022)   PRAPARE - Administrator, Civil Service (Medical): No    Lack of Transportation (Non-Medical): No  Physical Activity: Inactive (09/10/2022)   Exercise Vital Sign    Days of Exercise per Week: 0 days    Minutes of Exercise per Session: 0 min  Stress: Stress Concern Present (09/10/2022)   [employer] of Occupational Health - Occupational Stress Questionnaire    Feeling of Stress : To some extent  Social Connections: Socially Integrated (09/10/2022)   Social Connection and Isolation Panel [NHANES]    Frequency of Communication with Friends and Family: More than three times a week    Frequency of Social Gatherings with Friends and Family: More than three times a week    Attends Religious Services: More than 4 times per year    Active Member of [employer] or Organizations: Yes    Attends Engineer, structural: More than 4 times per year    Marital Status: Married  Catering manager Violence: Not At Risk (09/01/2022)   Humiliation, Afraid, Rape, and Kick questionnaire    Fear of Current or Ex-Partner: No    Emotionally Abused: No    Physically Abused: No  Sexually Abused: No   Current Outpatient Medications on File Prior to Visit  Medication Sig Dispense Refill   acetaminophen (TYLENOL) 325 MG tablet Take 650 mg by mouth every 6 (six) hours as needed for moderate pain.     BD INSULIN SYRINGE U/F 31G X 5/16" 1 ML MISC USE DAILY AS DIRECTED 100 each 3   COMBIGAN 0.2-0.5 % ophthalmic solution Place 1 drop into both eyes at bedtime.      Continuous Blood Gluc Receiver (FREESTYLE LIBRE 2 READER) DEVI 1 each by Does not  apply route daily. 1 each 3   Continuous Blood Gluc Sensor (FREESTYLE LIBRE 2 SENSOR) MISC 1 each by Does not apply route every 14 (fourteen) days. 6 each 3   diclofenac Sodium (VOLTAREN) 1 % GEL Apply 4 g topically 4 (four) times daily. 100 g 1   Dulaglutide (TRULICITY) 3 MG/0.5ML SOPN Inject 3 mg into the skin once a week. 6 mL 3   empagliflozin (JARDIANCE) 10 MG TABS tablet Take 1 tablet (10 mg total) by mouth daily. 10 tablet 0   ENTRESTO 97-103 MG TAKE 1 TABLET TWO TIMES A DAY 180 tablet 2   eplerenone (INSPRA) 25 MG tablet TAKE 1 TABLET DAILY 90 tablet 3   gabapentin (NEURONTIN) 300 MG capsule Take 2 capsules (600 mg total) by mouth 2 (two) times daily. 360 capsule 1   insulin aspart (NOVOLOG FLEXPEN) 100 UNIT/ML FlexPen Inject 10 units before each meal 30 mL 3   ketoconazole (NIZORAL) 2 % cream Apply 1 Application topically daily.     ketorolac (ACULAR) 0.5 % ophthalmic solution      LANTUS 100 UNIT/ML injection INJECT 40 TO 45 UNITS TOTAL UNDER THE SKIN DAILY (Patient taking differently: 42 Units daily.) 40 mL 3   lidocaine (LIDODERM) 5 % PLACE 1 PATCH ON THE SKIN DAILY. REMOVE AND DISCARD PATCH WITHIN 12 HOURS OR AS DIRECTED BY DOCTOR 90 patch 1   liraglutide (VICTOZA) 18 MG/3ML SOPN Inject 1.8 mg into the skin daily. 27 mL 2   meclizine (ANTIVERT) 25 MG tablet TAKE 1 TABLET THREE TIMES A DAY AS NEEDED FOR DIZZINESS 270 tablet 0   metoprolol succinate (TOPROL-XL) 50 MG 24 hr tablet TAKE 1 TABLET DAILY. TAKE WITH OR IMMEDIATELY FOLLLOWING A MEAL 90 tablet 0   omeprazole (PRILOSEC) 40 MG capsule 1 cap po bid 180 capsule 1   oxybutynin (DITROPAN-XL) 10 MG 24 hr tablet TAKE 1 TABLET AT BEDTIME 90 tablet 3   oxyCODONE-acetaminophen (PERCOCET) 10-325 MG tablet 1/2-1 tab po tid prn pain 30 tablet 0   rivaroxaban (XARELTO) 20 MG TABS tablet TAKE 1 TABLET DAILY WITH SUPPER 90 tablet 1   ROCKLATAN 0.02-0.005 % SOLN Apply to eye.     rOPINIRole (REQUIP) 1 MG tablet TAKE 1 TABLET DAILY 90 tablet 1    rosuvastatin (CRESTOR) 20 MG tablet TAKE 1 TABLET DAILY 90 tablet 3   Semaglutide, 1 MG/DOSE, 4 MG/3ML SOPN Inject 1 mg as directed once a week. 9 mL 3   sertraline (ZOLOFT) 100 MG tablet TAKE 1 TABLET DAILY 90 tablet 1   SURE COMFORT PEN NEEDLES 31G X 5 MM MISC USE TO INJECT INSULIN UNDER THE SKIN 100 each 11   No current facility-administered medications on file prior to visit.   No Known Allergies Family History  Problem Relation Age of Onset   Heart failure Mother    Stroke Mother    Stroke Father    Heart disease Sister    Heart  disease Sister    Cancer Sister        liver   Cancer Brother        lung   Cancer Brother        lung   Heart disease Brother    PE: There were no vitals taken for this visit. Wt Readings from Last 3 Encounters:  02/08/23 230 lb 9.6 oz (104.6 kg)  01/15/23 229 lb 9.6 oz (104.1 kg)  12/27/22 229 lb (103.9 kg)   Constitutional: overweight, in NAD Eyes: EOMI, no exophthalmos ENT: no thyromegaly, no cervical lymphadenopathy Cardiovascular: RRR, No MRG Respiratory: CTA B Musculoskeletal: no deformities Skin: no rashes Neurological: + tremor with outstretched hands  ASSESSMENT: 1. DM2, insulin-dependent, uncontrolled, with complications: - CAD with ?h/o AMI - nonischemic CMP - CHF, s/p AICD - Afib, s/p pacemaker - cerebrovascular disease with h/o TIA - CKD stage III - peripheral neuropathy - ED  No family history of medullary thyroid cancer or personal history of pancreatitis.  2. HL  PLAN:  1. Patient with longstanding, uncontrolled, type 2 diabetes, on SGLT2 inhibitor and basal-bolus insulin regimen and previously also on GLP-1 receptor agonist (Trulicity) of which she came off due to [employer].  At last visit, sugars were increasing after certain meals but more consistently after dinner.  They were occasionally dropping too low early morning and upon questioning, and he had increase his Lantus while off Trulicity.  He was  also taking more insulin before brunch, which we continued but he was forgetting to take NovoLog before dinner and we discussed about the importance of taking these consistently.  At that time, we gave him 1 month sample of Trulicity and we discussed about different options: I printed him a prescription for Trulicity to take to the [place] pharmacy and see if it was available We sent a prescription for Ozempic to his mail order pharmacy to see if he could get a cheaper We gave him paperwork for the Ozempic patient assistance program with [employer] -At last visit, HbA1c was 7.1%, stable, however, he had another HbA1c obtained at the end of last month (at the [place]) and this was higher, at 7.5%  CGM interpretation: -At today's visit, we reviewed his CGM downloads: It appears that *** of values are in target range (goal >70%), while *** are higher than 180 (goal <25%), and *** are lower than 70 (goal <4%).  The calculated average blood sugar is ***.  The projected HbA1c for the next 3 months (GMI) is ***. -Reviewing the CGM trends, ***  - I suggested to:  Patient Instructions  Please continue: - Jardiance 10 mg before breakfast - Lantus 38 units at bedtime - NovoLog  10-12 units before brunch 6-10 units before dinner If you have a snack at night, you may need ~5 units before the snack  - Trulicity 3 mg weekly or Ozempic 1 mg weekly  Please return in 4 months.   - advised to check sugars at different times of the day - 4x a day, rotating check times - advised for yearly eye exams >> he is UTD - At last visits we discussed about possibly trying alpha-lipoic acid for numbness in his feet -he did not try this yet.  He was already on Neurontin and ropinirole.  He has twice a year foot exam at the [place].  I do not have these records beyond a nurse evaluation from 08/2022.  I advised him to have these records sent to me. -  return to clinic in 4 months  2. HL -Reviewed latest lipid panel from 01/2019: LDL  at goal, HDL low -He is on Crestor 20 mg daily without side effects  Carlus Pavlov, MD PhD [facility] Endocrinology

## 2023-03-20 ENCOUNTER — Encounter: Payer: Self-pay | Admitting: Internal Medicine

## 2023-03-20 ENCOUNTER — Ambulatory Visit (INDEPENDENT_AMBULATORY_CARE_PROVIDER_SITE_OTHER): Payer: Medicare Other | Admitting: Internal Medicine

## 2023-03-20 VITALS — BP 110/60 | HR 85 | Ht 71.0 in | Wt 226.6 lb

## 2023-03-20 DIAGNOSIS — E785 Hyperlipidemia, unspecified: Secondary | ICD-10-CM

## 2023-03-20 DIAGNOSIS — E1165 Type 2 diabetes mellitus with hyperglycemia: Secondary | ICD-10-CM | POA: Diagnosis not present

## 2023-03-20 DIAGNOSIS — E1159 Type 2 diabetes mellitus with other circulatory complications: Secondary | ICD-10-CM | POA: Diagnosis not present

## 2023-03-20 DIAGNOSIS — Z7984 Long term (current) use of oral hypoglycemic drugs: Secondary | ICD-10-CM | POA: Diagnosis not present

## 2023-03-20 DIAGNOSIS — E119 Type 2 diabetes mellitus without complications: Secondary | ICD-10-CM

## 2023-03-20 NOTE — Patient Instructions (Addendum)
Please continue: - Jardiance 10 mg before breakfast - Lantus 38 units at bedtime - Ozempic 1 mg weekly  Please decrease: - NovoLog  8-10 units before brunch 6-8 units before dinner If you have a snack at night, you may need ~5 units before the snack   Let me know if you still have lows after the above changes.  Please return in 4 months.

## 2023-03-20 NOTE — Progress Notes (Signed)
Patient ID: Mason [name], male   DOB: 09-01-1940, 82 y.o.   MRN: [id number]   HPI: Mason [name] is a 82 y.o.-year-old male, initially referred by his PCP, Dr. Milinda Cave, returning for follow-up for DM2, dx in ~2011, insulin-dependent since 2019, uncontrolled, with long-term complications (CAD with ?h/o AMI, nonischemic CMP, CHF, Afib, cerebrovascular disease with h/o TIA, CKD stage III, peripheral neuropathy, ED).  Last visit 4 months ago.    Interim history: No increased urination, nausea, chest pain.  He has back pain.   Since last visit, he was able to restart Trulicity, but yesterday to switch to Ozempic.    Reviewed HbA1c levels:   Lab Results  Component Value Date   HGBA1C 7.1 (A) 02/08/2023   HGBA1C 7.1 (A) 09/29/2022   HGBA1C 6.6 (A) 05/31/2022   HGBA1C 7.3 (A) 01/26/2022   HGBA1C 7.2 (A) 09/08/2021   HGBA1C 6.7 (A) 12/09/2020   HGBA1C 7.6 (A) 09/09/2020   HGBA1C 9.7 (H) 06/22/2020   HGBA1C 9.0 (H) 03/23/2020   HGBA1C 9.2 (H) 03/16/2020   HGBA1C 9.9 (H) 11/20/2019   HGBA1C 9.7 (H) 05/27/2019   HGBA1C 9.0 (H) 02/26/2019   HGBA1C 10.1 (H) 11/29/2018   HGBA1C 9.0 (A) 08/29/2018   HGBA1C 9.0 08/29/2018   HGBA1C 9.0 (A) 08/29/2018   HGBA1C 9.0 (A) 08/29/2018   HGBA1C 7.8 (A) 05/14/2018   HGBA1C 8.1 (H) 02/06/2018   HGBA1C 8.0 (H) 10/26/2017   HGBA1C 8.2 (H) 07/27/2017   HGBA1C 7.0 03/01/2017   HGBA1C 7.1 11/16/2016   HGBA1C 7.8 (H) 07/03/2016   HGBA1C 6.8 03/14/2016   HGBA1C 7.0 (H) 12/09/2015   HGBA1C 7.4 (H) 09/20/2015   HGBA1C 7.2 (H) 03/25/2015   HGBA1C 7.5 (H) 10/15/2014  04/11/2021: HbA1c 7.1%  He was previously on: - Jardiance 10 mg before breakfast - Trulicity 1.5 >> 3  mg weekly - NovoLog pens :  14-18 >> 18-20 >> 15-16 >> 15 >> 10 >> but taking 12 units before brunch 8-10 units before dinner >> 0 units >> 7-8 >> but taking 10 units before a larger dinner  If you have a snack at night, you may need ~5 units before the snack - Lantus vial 44 >> 40 >> 36-40  >> 42 >> 45 >> 40 >> but taking 42 units at bedtime She was on metformin in the past.  Now on: - Jardiance 10 mg before breakfast - Trulicity 3 mg weekly >> off 2/2 Sport and exercise psychologist >>  tried: Victoza 1.8 mg before breakfast - not covered >> Trulicity 3 mg weekly >> Ozempic 1 mg (started yesterday) - Lantus 36 >> 42 >> 38 units at bedtime - NovoLog  10 >> 12 >> 10-12 units before brunch 6-10 before dinner If you have a snack at night, you may need ~5 units before the snack  Pt checks his sugars more than 4 times a day with his freestyle libre CGM:  Previously:  Previously:   Lowest sugar was: 44 >> 44 >> 48; he has hypoglycemia awareness at 70.  Highest sugar was 400 >> ... 200s >> 309 >> 235.  Glucometer: Freestyle  Pt's meals are: - Breakfast: cereals, eggs, grits, toast - Lunch: may skip or sandwich - Dinner: meat + veggies + starch - Snacks: apple/cheese >> bananasplits at night Patient saw nutrition a long time ago.  -+ Stage III CKD, last BUN/creatinine:  [facility]/20/[facility]):  Lab Results  Component Value Date   BUN 22 12/04/2022   BUN 19 09/11/2022  CREATININE 1.52 (H) 12/04/2022   CREATININE 1.40 09/11/2022  04/11/2021: 31/1.86, GFR 37, glucose 133  Lab Results  Component Value Date   MICRALBCREAT 0.9 12/04/2022   MICRALBCREAT 1.0 11/20/2019   MICRALBCREAT 1.4 02/06/2018   MICRALBCREAT 0.7 11/16/2016   MICRALBCREAT 0.5 10/15/2014   MICRALBCREAT 0.1 10/14/2013  On Entresto.  -+ HL; last set of lipids: 02/20/2023 ([facility]):  Lab Results  Component Value Date   CHOL 132 09/11/2022   HDL 45.80 09/11/2022   LDLCALC 54 09/11/2022   LDLDIRECT 103.0 06/22/2020   TRIG 161.0 (H) 09/11/2022   CHOLHDL 3 09/11/2022  04/11/2021: 141/214/45/53 Previously on Zocor 20, but changed to Crestor 20 mg daily.  - last eye exam was 01/11/2023:+ DR. He also has glaucoma.  - + numbness and tingling in his feet.  On Neurontin 300 mg twice a day.   Seeing podiatry - Dr. Yetta Barre at  the [facility] clinic.  No known FH of DM.  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  Other labs:    Drinks beer 2-3x a day, but not quite every day.  ROS: + see HPI  I reviewed pt's medications, allergies, PMH, social hx, family hx, and changes were documented in the history of present illness. Otherwise, unchanged from my initial visit note.  Past Medical History:  Diagnosis Date   AICD (automatic cardioverter/defibrillator) present 2018   MDT CRT-D.  Fatigue-->completely pacer dependent.  Pacer settings adjusted 12/2017 to allow more chronotrophc variance with ADL's//exertion.   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   02/2016 EF 40%, diffuse hypokinesis, grade 2 DD.  Myoc perf imaging showed EF 32% 04/2016.  Pt upgraded to CRT-D 01/04/17.   Chronic renal insufficiency, stage III (moderate) (HCC) 2015   CrCl about 60 ml/min   Complete heart block (HCC)    Has dual chamber pacer.   COVID-19 virus infection 01/05/2021   paxlovid   Depression    DOE (dyspnea on exertion)    NYHA class II/III CHF   Dyspnea 2021   with exertion, bending over   Episodic low back pain 01/22/2013   w/intermittent radiculitis (12/2014 his neurologist referred him to pain mgmt for epidural steroid injection)   Erectile dysfunction 2019   due to zoloft--urol rx'd viagra   GERD (gastroesophageal reflux disease)    H/O tilt table evaluation 11/02/2005   negative   Helicobacter pylori gastritis 01/2016   History of adenomatous polyp of colon 10/12/2011   Dr. Elnoria Howard (3 right side of colon- tubular adenomas removed)    History of cardiovascular stress test 05/28/2012   no ischemia, EF 37%, imaging results are unchanged and within normal variance   History of chronic prostatitis    History of kidney stones    History of vertigo    + Hx of posterior HA's.  Neuro (Dr. Sandria Manly) eval 2011.  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   Hyperlipidemia    Hypertension    Lumbar spondylosis    lumbosacral radiculopathy at L4 by EMG testing, right foot drop (neurologist is Dr. Luberta Robertson with  [facility] in [place])--neurologist referred him to neurosurgery   Migraine    "used to have them all the time; none for years" (01/04/2017)   Myocardial infarction [facility]) ?1970s   not entirely certain of this   Nephrolithiasis 07/2012   Left UVJ 2 mm stone with dilation of renal collecting system and slight hydroureter on right   Neuropathy    NICM (nonischemic cardiomyopathy) (HCC)    a. 02/2018 Cath: LM nl, LAD min irregs, LCX no, RCA 20d. IONG29. Fick CO/CI 4.4/2.0.   Osteoarthritis, multiple sites    Shoulders, back, knees   Pacemaker 02/05/2012   dual chamber, complete heart block, meddtronic revo, lasted checked 12/2015.  Since no CAD on cath 05/2016, cards recommends upgrade to CRT-D.   Permanent atrial fibrillation (HCC)    DCCV 07/09/13-converted, lasted two days, then back into afib--needs lifetime anticoagulation (Xarelto as of 09/2014)   Prostate cancer screening 09/2017   done by urol annually (normal prostate exam documented + PSA 0.84 as of 10/01/17 urol f/u.  10/2018 urol f/u PSA 0.6, no prostate nodule.   Rectus diastasis    Right ankle sprain 08/2017   w/distal fibula avulsion fx noted on u/s but not plain film-(Dr. Hudnall).   Skin cancer of arm, left    "burned it off" (01/04/2017)   TIA (transient ischemic attack)    L face and L arm weakness. Peri procedural->a. 03/22/2018 following cath. CT head neg. No MRI b/c has pacer.  Likely due to embolus to distal branch of RMCA   Type II diabetes mellitus (HCC)    Past Surgical History:  Procedure Laterality Date   ABI's Bilateral 05/21/2018   normal   BACK SURGERY     BIV ICD INSERTION CRT-D N/A 01/04/2017   Procedure: BiV ICD ;  Surgeon: Regan Lemming, MD;  Location: [facility] INVASIVE CV LAB;  Service: Cardiovascular;  Laterality: N/A;   CARDIAC CATHETERIZATION N/A 06/14/2016   Minimal nonobstructive dz, EF 25-35%.  Procedure: Left Heart Cath and Coronary Angiography;  Surgeon: Peter M Swaziland, MD;  Location: [facility] INVASIVE CV LAB;  Service: Cardiovascular;  Laterality: N/A;   CARDIOVASCULAR STRESS TEST  2012   2012 nuclear perfusion study: low risk scan; 04/2016 normal myocardial perfusion imaging, EF 32%.   CARDIOVERSION  07/09/2012   Procedure: CARDIOVERSION;  Surgeon: Thurmon Fair, MD;  Location: [facility] ENDOSCOPY;  Service: Cardiovascular;  Laterality: N/A;   CATARACT EXTRACTION W/ INTRAOCULAR LENS IMPLANT & ANTERIOR VITRECTOMY, BILATERAL Bilateral    CIRCUMCISION N/A 03/23/2020   Procedure: CIRCUMCISION ADULT;  Surgeon: Belva Agee, MD;  Location: [facility] ORS;  Service: Urology;  Laterality: N/A;   COLONOSCOPY W/ POLYPECTOMY  approx 2006; repeated 09/2011   Polyps on 2013 EGD as well, repeat 12/2014   COLONOSCOPY WITH PROPOFOL N/A 07/08/2021   adenoma x 1. Procedure: COLONOSCOPY WITH PROPOFOL;  Surgeon: Jeani Hawking, MD;  Location: [facility] ENDOSCOPY;  Service: Endoscopy;  Laterality: N/A;   ESOPHAGOGASTRODUODENOSCOPY  10/18/2006   Done due to chronic GERD: Normal, bx showed no barrett's esophagus (Dr. Elnoria Howard)   EYE SURGERY  01/08/2023   FLEXOR TENDON REPAIR Left 10/02/2016   Procedure: LEFT RING FINGER WOUND EXPORATION AND FLEXOR TENDON REPAIR AND NERVE REPAIR;  Surgeon: Mack Hook, MD;  Location: [facility] OR;  Service: Orthopedics;  Laterality: Left;   INSERT / REPLACE / REMOVE PACEMAKER  02/05/2012   dual chamber, sinus node dysfunction, sinus arrest, PAF, Medtronic  Revo serial#-[id number] H: last checked 05/2015   LUMBAR LAMINECTOMY Left 1976  L4-5   PACEMAKER REMOVAL  01/04/2017   PERMANENT PACEMAKER INSERTION N/A 02/05/2012   Procedure: PERMANENT PACEMAKER INSERTION;  Surgeon: Thurmon Fair, MD; Generator Medtronic [place] model [place] serial number [id number] H Laterality: N/A;   POLYPECTOMY  07/08/2021   Procedure: POLYPECTOMY;  Surgeon: Jeani Hawking, MD;  Location: [facility] ENDOSCOPY;  Service: Endoscopy;;   RETINAL DETACHMENT SURGERY Left ~ 1999   REVERSE SHOULDER ARTHROPLASTY Left 2018   Left shoulder reverse [facility] in [place]).   RIGHT/LEFT HEART CATH AND CORONARY ANGIOGRAPHY N/A 03/22/2018   EF 30-35%, no CAD.  Procedure: RIGHT/LEFT HEART CATH AND CORONARY ANGIOGRAPHY;  Surgeon: Dolores Patty, MD;  Location: [facility] INVASIVE CV LAB;  Service: Cardiovascular;  Laterality: N/A;   TRANSTHORACIC ECHOCARDIOGRAM  08/25/10; 05/2012; 03/23/16;12/2017   mild asymmetric LVH, normal systolic function, normal diastolic fxn, mild-to-mod mitral regurg, mild aortic valve sclerosis and trace AI, mild aortic root dilatation. 2014 f/u showed EF 40-45%, mod LAE, A FIB.  02/2016 EF 40%, diffuse hypokinesis, grade 2 DD. 12/2017 EF 35-40%,diffuse hypokin,grd III DD, mild MR   WISDOM TOOTH EXTRACTION  06/27/2021   EF 50-55%, mild aortic root/ascending aorta dilatation (41-42 mm).   Social History   Socioeconomic History   Marital status: Married    Spouse name: Not on file   Number of children: Not on file   Years of education: Not on file   Highest education level: 12th grade  Occupational History   Not on file  Tobacco Use   Smoking status: Never   Smokeless tobacco: Never  Vaping Use   Vaping status: Never Used  Substance and Sexual Activity   Alcohol use: Yes    Comment: occ   Drug use: No   Sexual activity: Not on file  Other Topics Concern   Not on file  Social History Narrative   Not on file   Social Determinants of Health   Financial  Resource Strain: Low Risk  (09/10/2022)   Overall Financial Resource Strain (CARDIA)    Difficulty of Paying Living Expenses: Not hard at all  Food Insecurity: No Food Insecurity (09/10/2022)   Hunger Vital Sign    Worried About Running Out of Food in the Last Year: Never true    Ran Out of Food in the Last Year: Never true  Transportation Needs: No Transportation Needs (09/10/2022)   PRAPARE - Administrator, Civil Service (Medical): No    Lack of Transportation (Non-Medical): No  Physical Activity: Inactive (09/10/2022)   Exercise Vital Sign    Days of Exercise per Week: 0 days    Minutes of Exercise per Session: 0 min  Stress: Stress Concern Present (09/10/2022)   [employer] of Occupational Health - Occupational Stress Questionnaire    Feeling of Stress : To some extent  Social Connections: Socially Integrated (09/10/2022)   Social Connection and Isolation Panel [NHANES]    Frequency of Communication with Friends and Family: More than three times a week    Frequency of Social Gatherings with Friends and Family: More than three times a week    Attends Religious Services: More than 4 times per year    Active Member of [employer] or Organizations: Yes    Attends Engineer, structural: More than 4 times per year    Marital Status: Married  Catering manager Violence: Not At Risk (09/01/2022)   Humiliation, Afraid, Rape, and Kick questionnaire    Fear of Current or Ex-Partner: No    Emotionally Abused: No  Physically Abused: No    Sexually Abused: No   Current Outpatient Medications on File Prior to Visit  Medication Sig Dispense Refill   acetaminophen (TYLENOL) 325 MG tablet Take 650 mg by mouth every 6 (six) hours as needed for moderate pain.     BD INSULIN SYRINGE U/F 31G X 5/16" 1 ML MISC USE DAILY AS DIRECTED 100 each 3   COMBIGAN 0.2-0.5 % ophthalmic solution Place 1 drop into both eyes at bedtime.      Continuous Blood Gluc Receiver (FREESTYLE LIBRE 2 READER)  DEVI 1 each by Does not apply route daily. 1 each 3   Continuous Blood Gluc Sensor (FREESTYLE LIBRE 2 SENSOR) MISC 1 each by Does not apply route every 14 (fourteen) days. 6 each 3   diclofenac Sodium (VOLTAREN) 1 % GEL Apply 4 g topically 4 (four) times daily. 100 g 1   Dulaglutide (TRULICITY) 3 MG/0.5ML SOPN Inject 3 mg into the skin once a week. 6 mL 3   empagliflozin (JARDIANCE) 10 MG TABS tablet Take 1 tablet (10 mg total) by mouth daily. 10 tablet 0   ENTRESTO 97-103 MG TAKE 1 TABLET TWO TIMES A DAY 180 tablet 2   eplerenone (INSPRA) 25 MG tablet TAKE 1 TABLET DAILY 90 tablet 3   gabapentin (NEURONTIN) 300 MG capsule Take 2 capsules (600 mg total) by mouth 2 (two) times daily. 360 capsule 1   insulin aspart (NOVOLOG FLEXPEN) 100 UNIT/ML FlexPen Inject 10 units before each meal 30 mL 3   ketoconazole (NIZORAL) 2 % cream Apply 1 Application topically daily.     ketorolac (ACULAR) 0.5 % ophthalmic solution      LANTUS 100 UNIT/ML injection INJECT 40 TO 45 UNITS TOTAL UNDER THE SKIN DAILY (Patient taking differently: 42 Units daily.) 40 mL 3   lidocaine (LIDODERM) 5 % PLACE 1 PATCH ON THE SKIN DAILY. REMOVE AND DISCARD PATCH WITHIN 12 HOURS OR AS DIRECTED BY DOCTOR 90 patch 1   liraglutide (VICTOZA) 18 MG/3ML SOPN Inject 1.8 mg into the skin daily. 27 mL 2   meclizine (ANTIVERT) 25 MG tablet TAKE 1 TABLET THREE TIMES A DAY AS NEEDED FOR DIZZINESS 270 tablet 0   metoprolol succinate (TOPROL-XL) 50 MG 24 hr tablet TAKE 1 TABLET DAILY. TAKE WITH OR IMMEDIATELY FOLLLOWING A MEAL 90 tablet 0   omeprazole (PRILOSEC) 40 MG capsule 1 cap po bid 180 capsule 1   oxybutynin (DITROPAN-XL) 10 MG 24 hr tablet TAKE 1 TABLET AT BEDTIME 90 tablet 3   oxyCODONE-acetaminophen (PERCOCET) 10-325 MG tablet 1/2-1 tab po tid prn pain 30 tablet 0   rivaroxaban (XARELTO) 20 MG TABS tablet TAKE 1 TABLET DAILY WITH SUPPER 90 tablet 1   ROCKLATAN 0.02-0.005 % SOLN Apply to eye.     rOPINIRole (REQUIP) 1 MG tablet TAKE 1  TABLET DAILY 90 tablet 1   rosuvastatin (CRESTOR) 20 MG tablet TAKE 1 TABLET DAILY 90 tablet 3   Semaglutide, 1 MG/DOSE, 4 MG/3ML SOPN Inject 1 mg as directed once a week. 9 mL 3   sertraline (ZOLOFT) 100 MG tablet TAKE 1 TABLET DAILY 90 tablet 1   SURE COMFORT PEN NEEDLES 31G X 5 MM MISC USE TO INJECT INSULIN UNDER THE SKIN 100 each 11   No current facility-administered medications on file prior to visit.   No Known Allergies Family History  Problem Relation Age of Onset   Heart failure Mother    Stroke Mother    Stroke Father    Heart  disease Sister    Heart disease Sister    Cancer Sister        liver   Cancer Brother        lung   Cancer Brother        lung   Heart disease Brother    PE: BP 110/60   Pulse 85   Ht 5\' 11"  (1.803 m)   Wt 226 lb 9.6 oz (102.8 kg)   SpO2 96%   BMI 31.60 kg/m  Wt Readings from Last 3 Encounters:  03/20/23 226 lb 9.6 oz (102.8 kg)  02/08/23 230 lb 9.6 oz (104.6 kg)  01/15/23 229 lb 9.6 oz (104.1 kg)   Constitutional: overweight, in NAD Eyes: EOMI, no exophthalmos ENT: no thyromegaly, no cervical lymphadenopathy Cardiovascular: RRR, No MRG Respiratory: CTA B Musculoskeletal: no deformities Skin: no rashes Neurological: + tremor with outstretched hands Diabetic Foot Exam - Simple   Simple Foot Form Diabetic Foot exam was performed with the following findings: Yes 03/20/2023  4:30 PM  Visual Inspection No deformities, no ulcerations, no other skin breakdown bilaterally: Yes Sensation Testing Intact to touch and monofilament testing bilaterally: Yes Pulse Check Posterior Tibialis and Dorsalis pulse intact bilaterally: Yes Comments Decreased sensation to monofilament in L forefoot.    ASSESSMENT: 1. DM2, insulin-dependent, uncontrolled, with complications: - CAD with ?h/o AMI - nonischemic CMP - CHF, s/p AICD - Afib, s/p pacemaker - cerebrovascular disease with h/o TIA - CKD stage III - peripheral neuropathy - ED  No family  history of medullary thyroid cancer or personal history of pancreatitis.  2. HL  PLAN:  1. Patient with longstanding, uncontrolled, type 2 diabetes, on SGLT2 inhibitor and basal-bolus insulin regimen and previously also on GLP-1 receptor agonist (Trulicity) of which she came off due to [employer].  At last visit, sugars were increasing after certain meals but more consistently after dinner.  They were occasionally dropping too low early morning and upon questioning, and he had increase his Lantus while off Trulicity.  He was also taking more insulin before brunch, which we continued but he was forgetting to take NovoLog before dinner and we discussed about the importance of taking these consistently.  At that time, we gave him 1 month sample of Trulicity and we discussed about different options: I printed him a prescription for Trulicity to take to the [place] pharmacy and see if this was available Sent a prescription of Ozempic to his mail order pharmacy to see if he could get it cheaper We gave him paperwork for the Ozempic patient assistance program with [employer] -He was able to start Trulicity since last visit, and then Ozempic yesterday. -At last visit, HbA1c was 7.1%, stable, however, he had another HbA1c obtained at the end of last month (at the [place]) and this was higher, at 7.5% CGM interpretation: -At today's visit, we reviewed his CGM downloads: It appears that 95% of values are in target range (goal >70%), while 4% are higher than 180 (goal <25%), and 1% are lower than 70 (goal <4%).  The calculated average blood sugar is 133.  The projected HbA1c for the next 3 months (GMI) is 6.5%. -Reviewing the CGM trends, sugars are much improved after he was able to start Trulicity, with the vast majority of the blood sugars fluctuating within the target range.  He still has occasional high blood sugars at night and also some lows around 5 PM.  Upon questioning, he is forgetting to take his mealtime  insulin  before brunch and sometimes takes it later, prompting hypoglycemia.  We discussed about the importance of taking it before meals but I advised him to take a low-dose if he has to take it after the meal. -Since she just switched to Ozempic and since this is a stronger effect on blood sugars compared to Trulicity, I did advise him to decrease the NovoLog doses.  I advised him to let me know if he has lows going forward, in which case, we will have to also reduce Lantus. -He has a dry mouth and feels dehydrated on Jardiance but he does admit that he is not drinking enough water.  I strongly advised him to stay hydrated. - I suggested to:  Patient Instructions  Please continue: - Jardiance 10 mg before breakfast - Lantus 38 units at bedtime - Ozempic 1 mg weekly  Please decrease: - NovoLog  8-10 units before brunch 6-8 units before dinner If you have a snack at night, you may need ~5 units before the snack   Let me know if you still have lows after the above changes.  Please return in 4 months.   - advised to check sugars at different times of the day - 4x a day, rotating check times - advised for yearly eye exams >> he is UTD - At last visits we discussed about possibly trying alpha-lipoic acid for numbness in his feet -he did not try this yet.  He was already on Neurontin and ropinirole.  He has twice a year foot exam at the [place].  I do not have these records beyond a nurse evaluation from 08/2022.  I advised him to have these records sent to me.  For now, I did a foot exam here in clinic. - return to clinic in 4 months  2. HL -Reviewed latest lipid panel from 01/2023: LDL at goal, HDL low -He is on Crestor 20 mg daily w/o SEs  Carlus Pavlov, MD PhD [facility] Endocrinology

## 2023-03-20 NOTE — Addendum Note (Signed)
Addended by: Pollie Meyer on: 03/20/2023 04:09 PM   Modules accepted: Orders

## 2023-03-26 ENCOUNTER — Other Ambulatory Visit: Payer: Self-pay | Admitting: Cardiovascular Disease

## 2023-03-27 DIAGNOSIS — L218 Other seborrheic dermatitis: Secondary | ICD-10-CM | POA: Diagnosis not present

## 2023-03-27 DIAGNOSIS — L821 Other seborrheic keratosis: Secondary | ICD-10-CM | POA: Diagnosis not present

## 2023-03-27 DIAGNOSIS — D492 Neoplasm of unspecified behavior of bone, soft tissue, and skin: Secondary | ICD-10-CM | POA: Diagnosis not present

## 2023-03-28 ENCOUNTER — Ambulatory Visit (INDEPENDENT_AMBULATORY_CARE_PROVIDER_SITE_OTHER): Payer: Medicare Other | Admitting: Family Medicine

## 2023-03-28 ENCOUNTER — Encounter: Payer: Self-pay | Admitting: Family Medicine

## 2023-03-28 ENCOUNTER — Other Ambulatory Visit: Payer: Medicare Other

## 2023-03-28 ENCOUNTER — Ambulatory Visit (HOSPITAL_BASED_OUTPATIENT_CLINIC_OR_DEPARTMENT_OTHER)
Admission: RE | Admit: 2023-03-28 | Discharge: 2023-03-28 | Disposition: A | Discharge status: Home / Self Care | Payer: Medicare Other | Source: Ambulatory Visit | Attending: Family Medicine | Admitting: Family Medicine

## 2023-03-28 VITALS — BP 110/62 | HR 74 | Ht 71.0 in | Wt 227.0 lb

## 2023-03-28 DIAGNOSIS — R109 Unspecified abdominal pain: Secondary | ICD-10-CM

## 2023-03-28 DIAGNOSIS — R0781 Pleurodynia: Secondary | ICD-10-CM

## 2023-03-28 DIAGNOSIS — I517 Cardiomegaly: Secondary | ICD-10-CM | POA: Diagnosis not present

## 2023-03-28 DIAGNOSIS — R0789 Other chest pain: Secondary | ICD-10-CM

## 2023-03-28 DIAGNOSIS — R10A0 Flank pain, unspecified side: Secondary | ICD-10-CM

## 2023-03-28 DIAGNOSIS — I771 Stricture of artery: Secondary | ICD-10-CM | POA: Diagnosis not present

## 2023-03-28 DIAGNOSIS — M5414 Radiculopathy, thoracic region: Secondary | ICD-10-CM

## 2023-03-28 LAB — POCT URINALYSIS DIPSTICK
Bilirubin, UA: NEGATIVE
Blood, UA: NEGATIVE
Glucose, UA: POSITIVE — AB
Ketones, UA: NEGATIVE
Leukocytes, UA: NEGATIVE
Nitrite, UA: NEGATIVE
Protein, UA: POSITIVE — AB
Spec Grav, UA: 1.025 (ref 1.010–1.025)
Urobilinogen, UA: 0.2 U/dL
pH, UA: 5 (ref 5.0–8.0)

## 2023-03-28 NOTE — Progress Notes (Signed)
OFFICE VISIT  03/28/2023  CC:  Chief Complaint  Patient presents with   Back Pain    Lower back X 14 days; took Tylenol 325mg  x2-3 days last week. Started on the right side radiating to the center and moving to the left. Has frequency and urgency, denies burning.     Patient is a 82 y.o. male who presents for back pain. He has a history of right sided thoracic radiculopathy. I last saw him for this on 01/15/2023. A/P as of last visit: "Thoracic radiculopathy. The only thing that has helped him in the past with this pain is Percocet.  He has always used this type of medication appropriately. We discussed possibly maximizing his dose of gabapentin but chose to hold off today. Instead I prescribed Percocet 10-325, half to 1 tab 3 times daily as needed, #30."  INTERIM HX: About 2 weeks ago Dawn noticed pain across the lower and mid back, mild intensity.  Now the pain is focused exclusively on the left posterolateral inferior costal margin. When he is sitting it does not hurt at all.  When he tries to get up from a chair it hurts severely.  It lessens when he is walking but is not gone. No rash.  No preceding overuse, acute strain, or trauma. Percocet 10/325, 1 tab as needed does not help at all.  This is similar to the pain he had on the RIGHT posterolateral thoracic region when he was ultimately diagnosed with thoracic radiculopathy earlier this year.  ROS as above, plus--> no fevers, no CP, no SOB, no wheezing, no cough, no dizziness, no HAs, no rashes, no melena/hematochezia.  No polyuria or polydipsia.  No myalgias or arthralgias.  No focal weakness, paresthesias, or tremors.  No acute vision or hearing abnormalities.  No dysuria or unusual/new urinary urgency or frequency.  No recent changes in lower legs. No n/v/d or abd pain.  No palpitations.     Past Medical History:  Diagnosis Date   AICD (automatic cardioverter/defibrillator) present 2018   MDT CRT-D.  Fatigue-->completely  pacer dependent.  Pacer settings adjusted 12/2017 to allow more chronotrophc variance with ADL's//exertion.   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   02/2016 EF 40%, diffuse hypokinesis, grade 2 DD.  Myoc perf imaging showed EF 32% 04/2016.  Pt upgraded to CRT-D 01/04/17.   Chronic renal insufficiency, stage III (moderate) (HCC) 2015   CrCl about 60 ml/min   Complete heart block (HCC)    Has dual chamber pacer.   COVID-19 virus infection 01/05/2021   paxlovid   Depression    DOE (dyspnea on exertion)    NYHA class II/III CHF   Dyspnea 2021   with exertion, bending over   Episodic low back pain 01/22/2013   w/intermittent radiculitis (12/2014 his neurologist referred him to pain mgmt for epidural steroid injection)   Erectile dysfunction 2019   due to zoloft--urol rx'd viagra   GERD (gastroesophageal reflux disease)    H/O tilt table evaluation 11/02/2005   negative   Helicobacter pylori gastritis 01/2016   History of adenomatous polyp of colon 10/12/2011   Dr. Elnoria Howard (3 right side of colon- tubular adenomas removed)   History of cardiovascular stress test  05/28/2012   no ischemia, EF 37%, imaging results are unchanged and within normal variance   History of chronic prostatitis    History of kidney stones    History of vertigo    + Hx of posterior HA's.  Neuro (Dr. Sandria Manly) eval 2011.  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   Hyperlipidemia    Hypertension    Lumbar spondylosis    lumbosacral radiculopathy at L4 by EMG testing, right foot drop (neurologist is Dr. Luberta Robertson with [facility] in [place])--neurologist  referred him to neurosurgery   Migraine    "used to have them all the time; none for years" (01/04/2017)   Myocardial infarction [facility]) ?1970s   not entirely certain of this   Nephrolithiasis 07/2012   Left UVJ 2 mm stone with dilation of renal collecting system and slight hydroureter on right   Neuropathy    NICM (nonischemic cardiomyopathy) (HCC)    a. 02/2018 Cath: LM nl, LAD min irregs, LCX no, RCA 20d. [id number]. Fick CO/CI 4.4/2.0.   Osteoarthritis, multiple sites    Shoulders, back, knees   Pacemaker 02/05/2012   dual chamber, complete heart block, meddtronic revo, lasted checked 12/2015.  Since no CAD on cath 05/2016, cards recommends upgrade to CRT-D.   Permanent atrial fibrillation (HCC)    DCCV 07/09/13-converted, lasted two days, then back into afib--needs lifetime anticoagulation (Xarelto as of 09/2014)   Prostate cancer screening 09/2017   done by urol annually (normal prostate exam documented + PSA 0.84 as of 10/01/17 urol f/u.  10/2018 urol f/u PSA 0.6, no prostate nodule.   Rectus diastasis    Right ankle sprain 08/2017   w/distal fibula avulsion fx noted on u/s but not plain film-(Dr. Hudnall).   Skin cancer of arm, left    "burned it off" (01/04/2017)   TIA (transient ischemic attack)    L face and L arm weakness. Peri procedural->a. 03/22/2018 following cath. CT head neg. No MRI b/c has pacer. Likely due to embolus to distal branch of RMCA   Type II diabetes mellitus (HCC)     Past Surgical History:  Procedure Laterality Date   ABI's Bilateral 05/21/2018   normal   BACK SURGERY     BIV ICD INSERTION CRT-D N/A 01/04/2017   Procedure: BiV ICD ;  Surgeon: Regan Lemming, MD;  Location[facility] INVASIVE CV LAB;  Service: Cardiovascular;  Laterality: N/A;   CARDIAC CATHETERIZATION N/A 06/14/2016   Minimal nonobstructive dz, EF 25-35%.  Procedure: Left Heart Cath and Coronary Angiography;  Surgeon: Peter M [place], MD;  Location: [facility] INVASIVE CV LAB;  Service: Cardiovascular;   Laterality: N/A;   CARDIOVASCULAR STRESS TEST  2012   2012 nuclear perfusion study: low risk scan; 04/2016 normal myocardial perfusion imaging, EF 32%.   CARDIOVERSION  07/09/2012   Procedure: CARDIOVERSION;  Surgeon: Thurmon Fair, MD;  Location: [facility] ENDOSCOPY;  Service: Cardiovascular;  Laterality: N/A;   CATARACT EXTRACTION W/ INTRAOCULAR LENS IMPLANT & ANTERIOR VITRECTOMY, BILATERAL Bilateral    CIRCUMCISION N/A 03/23/2020   Procedure: CIRCUMCISION ADULT;  Surgeon: Belva Agee, MD;  Location: [facility] ORS;  Service: Urology;  Laterality: N/A;   COLONOSCOPY W/ POLYPECTOMY  approx 2006; repeated 09/2011   Polyps on 2013 EGD as well, repeat 12/2014   COLONOSCOPY WITH PROPOFOL N/A 07/08/2021   adenoma x 1. Procedure: COLONOSCOPY WITH PROPOFOL;  Surgeon: Jeani Hawking, MD;  Location: [facility] ENDOSCOPY;  Service: Endoscopy;  Laterality: N/A;  ESOPHAGOGASTRODUODENOSCOPY  10/18/2006   Done due to chronic GERD: Normal, bx showed no barrett's esophagus (Dr. Elnoria Howard)   EYE SURGERY  01/08/2023   FLEXOR TENDON REPAIR Left 10/02/2016   Procedure: LEFT RING FINGER WOUND EXPORATION AND FLEXOR TENDON REPAIR AND NERVE REPAIR;  Surgeon: Mack Hook, MD;  Location: [facility] OR;  Service: Orthopedics;  Laterality: Left;   INSERT / REPLACE / REMOVE PACEMAKER  02/05/2012   dual chamber, sinus node dysfunction, sinus arrest, PAF, Medtronic Revo serial#-[id number] H: last checked 05/2015   LUMBAR LAMINECTOMY Left 1976   L4-5   PACEMAKER REMOVAL  01/04/2017   PERMANENT PACEMAKER INSERTION N/A 02/05/2012   Procedure: PERMANENT PACEMAKER INSERTION;  Surgeon: Thurmon Fair, MD; Generator Medtronic [place] model [place] serial number [id number] H Laterality: N/A;   POLYPECTOMY  07/08/2021   Procedure: POLYPECTOMY;  Surgeon: Jeani Hawking, MD;  Location: [facility] ENDOSCOPY;  Service: Endoscopy;;   RETINAL DETACHMENT SURGERY Left ~ 1999   REVERSE SHOULDER ARTHROPLASTY Left 2018   Left shoulder reverse [facility] in [place]).    RIGHT/LEFT HEART CATH AND CORONARY ANGIOGRAPHY N/A 03/22/2018   EF 30-35%, no CAD.  Procedure: RIGHT/LEFT HEART CATH AND CORONARY ANGIOGRAPHY;  Surgeon: Dolores Patty, MD;  Location: [facility] INVASIVE CV LAB;  Service: Cardiovascular;  Laterality: N/A;   TRANSTHORACIC ECHOCARDIOGRAM  08/25/10; 05/2012; 03/23/16;12/2017   mild asymmetric LVH, normal systolic function, normal diastolic fxn, mild-to-mod mitral regurg, mild aortic valve sclerosis and trace AI, mild aortic root dilatation. 2014 f/u showed EF 40-45%, mod LAE, A FIB.  02/2016 EF 40%, diffuse hypokinesis, grade 2 DD. 12/2017 EF 35-40%,diffuse hypokin,grd III DD, mild MR   WISDOM TOOTH EXTRACTION  06/27/2021   EF 50-55%, mild aortic root/ascending aorta dilatation (41-42 mm).    Outpatient Medications Prior to Visit  Medication Sig Dispense Refill   acetaminophen (TYLENOL) 325 MG tablet Take 650 mg by mouth every 6 (six) hours as needed for moderate pain.     BD INSULIN SYRINGE U/F 31G X 5/16" 1 ML MISC USE DAILY AS DIRECTED 100 each 3   COMBIGAN 0.2-0.5 % ophthalmic solution Place 1 drop into both eyes at bedtime.      Continuous Blood Gluc Receiver (FREESTYLE LIBRE 2 READER) DEVI 1 each by Does not apply route daily. 1 each 3   Continuous Blood Gluc Sensor (FREESTYLE LIBRE 2 SENSOR) MISC 1 each by Does not apply route every 14 (fourteen) days. 6 each 3   diclofenac Sodium (VOLTAREN) 1 % GEL Apply 4 g topically 4 (four) times daily. 100 g 1   Dulaglutide (TRULICITY) 3 MG/0.5ML SOPN Inject 3 mg into the skin once a week. 6 mL 3   empagliflozin (JARDIANCE) 10 MG TABS tablet Take 1 tablet (10 mg total) by mouth daily. 10 tablet 0   ENTRESTO 97-103 MG TAKE 1 TABLET TWO TIMES A DAY 180 tablet 2   eplerenone (INSPRA) 25 MG tablet TAKE 1 TABLET DAILY 90 tablet 3   gabapentin (NEURONTIN) 300 MG capsule Take 2 capsules (600 mg total) by mouth 2 (two) times daily. 360 capsule 1   insulin aspart (NOVOLOG FLEXPEN) 100 UNIT/ML FlexPen Inject 10 units  before each meal 30 mL 3   ketoconazole (NIZORAL) 2 % cream Apply 1 Application topically daily.     ketorolac (ACULAR) 0.5 % ophthalmic solution      LANTUS 100 UNIT/ML injection INJECT 40 TO 45 UNITS TOTAL UNDER THE SKIN DAILY (Patient taking differently: 42 Units daily.) 40 mL 3   lidocaine (LIDODERM) 5 %  PLACE 1 PATCH ON THE SKIN DAILY. REMOVE AND DISCARD PATCH WITHIN 12 HOURS OR AS DIRECTED BY DOCTOR 90 patch 1   liraglutide (VICTOZA) 18 MG/3ML SOPN Inject 1.8 mg into the skin daily. 27 mL 2   meclizine (ANTIVERT) 25 MG tablet TAKE 1 TABLET THREE TIMES A DAY AS NEEDED FOR DIZZINESS 270 tablet 0   metoprolol succinate (TOPROL-XL) 50 MG 24 hr tablet TAKE 1 TABLET DAILY. TAKE WITH OR IMMEDIATELY FOLLLOWING A MEAL 90 tablet 0   omeprazole (PRILOSEC) 40 MG capsule 1 cap po bid 180 capsule 1   oxybutynin (DITROPAN-XL) 10 MG 24 hr tablet TAKE 1 TABLET AT BEDTIME 90 tablet 3   oxyCODONE-acetaminophen (PERCOCET) 10-325 MG tablet 1/2-1 tab po tid prn pain 30 tablet 0   rivaroxaban (XARELTO) 20 MG TABS tablet TAKE 1 TABLET DAILY WITH SUPPER 90 tablet 1   ROCKLATAN 0.02-0.005 % SOLN Apply to eye.     rOPINIRole (REQUIP) 1 MG tablet TAKE 1 TABLET DAILY 90 tablet 1   rosuvastatin (CRESTOR) 20 MG tablet TAKE 1 TABLET DAILY 90 tablet 3   Semaglutide, 1 MG/DOSE, 4 MG/3ML SOPN Inject 1 mg as directed once a week. 9 mL 3   sertraline (ZOLOFT) 100 MG tablet TAKE 1 TABLET DAILY 90 tablet 1   SURE COMFORT PEN NEEDLES 31G X 5 MM MISC USE TO INJECT INSULIN UNDER THE SKIN 100 each 11   No facility-administered medications prior to visit.    No Known Allergies  Review of Systems  As per HPI  PE:    03/28/2023   10:25 AM 03/20/2023    4:09 PM 02/08/2023   11:15 AM  Vitals with BMI  Height 5\' 11"  5\' 11"  5\' 11"   Weight 227 lbs 226 lbs 10 oz 230 lbs 10 oz  BMI 31.67 31.62 32.18  Systolic 110 110 161  Diastolic 62 60 80  Pulse 74 85 86     Physical Exam  Gen: Alert, well appearing.  Patient is  oriented to person, place, time, and situation. He has focal tenderness to palpation at the posterolateral inferior costal margin. No subcutaneous mass, no induration, no rash.  The area of tenderness is about the size of a golf ball. Abdomen is soft and nontender.  No hepatosplenomegaly, mass, or distention. Lungs are clear bilaterally, breath sounds equal, expansion normal.  LABS:  Last CBC Lab Results  Component Value Date   WBC 5.6 06/09/2021   HGB 16.0 06/09/2021   HCT 49.2 06/09/2021   MCV 92.5 06/09/2021   MCH 30.3 03/19/2020   RDW 13.6 06/09/2021   PLT 129.0 (L) 06/09/2021   Last metabolic panel Lab Results  Component Value Date   GLUCOSE 65 (L) 12/04/2022   NA 141 12/04/2022   K 5.1 12/04/2022   CL 102 12/04/2022   CO2 32 12/04/2022   BUN 22 12/04/2022   CREATININE 1.52 (H) 12/04/2022   GFR 43.05 (L) 12/04/2022   CALCIUM 9.7 12/04/2022   PROT 7.6 09/11/2022   ALBUMIN 4.3 09/11/2022   BILITOT 0.6 09/11/2022   ALKPHOS 89 09/11/2022   AST 19 09/11/2022   ALT 18 09/11/2022   ANIONGAP 9 03/23/2020   UA today is pos for glucose, otherwise normal.  IMPRESSION AND PLAN:  Left posteriorlateral chest wall tenderness. This is very similar to the history and exam features he had earlier in the year on the right side at the same level.  He underwent a large diagnostic workup for that. Ultimately the diagnosis at  that time was thoracic radiculopathy.  This eventually resolved. Will go ahead and check chest x-ray and left-sided rib films today.  If these are unremarkable then I recommend he return to see his neurosurgeon.  An After Visit Summary was printed and given to the patient.  FOLLOW UP: No follow-ups on file.  Signed:  Santiago Bumpers, MD           03/28/2023

## 2023-03-29 ENCOUNTER — Other Ambulatory Visit: Payer: Self-pay | Admitting: Cardiovascular Disease

## 2023-03-29 ENCOUNTER — Ambulatory Visit: Payer: Medicare Other | Admitting: Cardiovascular Disease

## 2023-03-29 DIAGNOSIS — I4821 Permanent atrial fibrillation: Secondary | ICD-10-CM

## 2023-03-29 NOTE — Progress Notes (Deleted)
`   Cardiology Office Note    Date:  03/29/2023   ID:  Mason [name], DOB Aug 25, 1940, MRN [id number]  PCP:  Jeoffrey Massed, MD  Cardiologist:   Thurmon Fair, MD   No chief complaint on file.   History of Present Illness:  Mason [name] is a 82 y.o. male with nonischemic cardiomyopathy, permanent atrial fibrillation and complete heart block who underwent implantation of a CRT-D in June 2018 for deteriorating left ventricular systolic function.  He is pacemaker dependent.  Generally feels well.  Has NYHA functional class I, occasionally class II.  He may get dyspnea when climbing a hill but he can use a push mower in his yard for small patches of grass.  He complains of some dizziness if he jumps up after working stooped over on a flower bed, suggesting orthostatic hypotension, but also has dizziness when he lays back in bed, more consistent with vertigo.  The dizziness is never severe and resolves quickly.  He has not experienced syncope.  Denies lower extremity edema, orthopnea or PND.  He has not had any chest pain at rest or with activity.  His most recent echocardiogram performed last December showed improvement in LV systolic function with ejection fraction that was estimated at 50-55% (53% by 3D volume measurement) he has mild dilation of the ascending aorta at a maximum of about 42 mm by echo.  A similar measurement was obtained by CT of the chest at 4.3-4.2 cm (Nov 25, 2021).  Device interrogation shows normal function.  He has 1.9 years of remaining longevity on this generator.  He has 99.7% biventricular pacing with good heart rate histogram distribution.  Lead parameters are normal.  He has not had any meaningful episodes of high ventricular rate.  OptiVol showed slight upward deviation consistent with fluid gain in the first 2 weeks or so of this month, but has returned to normal.  There was an interruption in his Jardiance prescription during that time.   Left ventricular pacing is  in the LV 3-RV coil configuration.  He is pacemaker dependent without any escape rhythm when pacing is taken down to 30 bpm. Presenting rhythm is atrial fibrillation with biventricular pacing.  His ECG shows a distinctive positive R wave in lead V1, although the QRS remains quite broad at about 180 ms (RBBB plus LAFB pattern).  Glycemic control is fair with the most recent hemoglobin A1c of 7.3% (slight deterioration from 6.7% last year).  Continues to have a low HDL at 36 and has mild hypertriglyceridemia but excellent LDL at 46.  On maximum dose Entresto, on SGLT2 inhibitor and on metoprolol succinate and maximum tolerated dose.  Switched from spironolactone to eplerenone due to gynecomastia, with resolution of the side effects.  He has a prescription for "as needed" extra doses of furosemide but has not required this in a year or longer.  He does not have coronary or other vascular disorders, but has numerous risk factors including type 2 diabetes, hyperlipidemia, mild obesity. He has a history of gout.  He has gained about 20 pounds since his last appointment.  He is now taking both Lantus and Jardiance.  The cause of Don's cardiomyopathy remains uncertain.  He initially presented with a "heart attack" and [facility] for 3 to 4 days in 1976.  He has had a depressed left ventricular systolic function for many years.  Cardiac catheterization most recently performed in 2019 does  show nonobstructive atherosclerosis.[name]  He has a Theatre manager including 2 stints in [place] and he knows that he was exposed to [employer].  He underwent right and left heart catheterization for optimization of heart failure therapy on March 22, 2018.  Right atrial pressure was 7 mmHg, wedge pressure was 13 and PA pressure was 30/10 (mean 22) millimeters Hg.  The cardiac index was 2.0.  The coronary arteries had only minimal atherosclerosis.  Radial artery access was  difficult.  On the catheterization he developed transient weakness of the left upper extremity and left facial droop, from which he recovered fully thankfully.  His dose of beta-blocker was reduced.  He is not on diuretics.  He subsequently underwent cardiopulmonary stress testing on September 18 and did quite well with a mildly reduced peak VO2 of 16.8 mL/kg/min (79% of control).  There was still evidence of drug-induced chronotropic incompetence with a maximum heart rate only reached 74% of predicted.     Past Medical History:  Diagnosis Date   AICD (automatic cardioverter/defibrillator) present 2018   MDT CRT-D.  Fatigue-->completely pacer dependent.  Pacer settings adjusted 12/2017 to allow more chronotrophc variance with ADL's//exertion.   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   02/2016 EF 40%, diffuse hypokinesis, grade 2 DD.  Myoc perf imaging showed EF 32% 04/2016.  Pt upgraded to CRT-D 01/04/17.   Chronic renal insufficiency, stage III (moderate) (HCC) 2015   CrCl about 60 ml/min   Complete heart block (HCC)    Has dual chamber pacer.   COVID-19 virus infection 01/05/2021   paxlovid   Depression    DOE (dyspnea on exertion)    NYHA class II/III CHF   Dyspnea 2021   with exertion, bending over   Episodic low back pain 01/22/2013   w/intermittent radiculitis (12/2014 his neurologist referred him to pain mgmt for epidural steroid injection)   Erectile dysfunction 2019   due to zoloft--urol rx'd viagra   GERD (gastroesophageal reflux disease)    H/O tilt table evaluation 11/02/2005   negative   Helicobacter pylori gastritis 01/2016   History of adenomatous polyp of colon 10/12/2011   Dr. Elnoria Howard (3  right side of colon- tubular adenomas removed)   History of cardiovascular stress test 05/28/2012   no ischemia, EF 37%, imaging results are unchanged and within normal variance   History of chronic prostatitis    History of kidney stones    History of vertigo    + Hx of posterior HA's.  Neuro (Dr. Sandria Manly) eval 2011.  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   Hyperlipidemia    Hypertension    Lumbar spondylosis    lumbosacral radiculopathy at L4 by EMG testing, right foot drop (neurologist is Dr. Luberta Robertson with [facility] in [place])--neurologist referred him to neurosurgery   Migraine    "used to have them all the time; none for years" (01/04/2017)   Myocardial infarction [facility]) ?1970s   not entirely certain of this   Nephrolithiasis 07/2012   Left UVJ 2 mm stone with dilation of renal collecting system and slight hydroureter on right   Neuropathy    NICM (nonischemic cardiomyopathy) (HCC)  a. 02/2018 Cath: LM nl, LAD min irregs, LCX no, RCA 20d. [id number]. Fick CO/CI 4.4/2.0.   Osteoarthritis, multiple sites    Shoulders, back, knees   Pacemaker 02/05/2012   dual chamber, complete heart block, meddtronic revo, lasted checked 12/2015.  Since no CAD on cath 05/2016, cards recommends upgrade to CRT-D.   Permanent atrial fibrillation (HCC)    DCCV 07/09/13-converted, lasted two days, then back into afib--needs lifetime anticoagulation (Xarelto as of 09/2014)   Prostate cancer screening 09/2017   done by urol annually (normal prostate exam documented + PSA 0.84 as of 10/01/17 urol f/u.  10/2018 urol f/u PSA 0.6, no prostate nodule.   Rectus diastasis    Right ankle sprain 08/2017   w/distal fibula avulsion fx noted on u/s but not plain film-(Dr. Hudnall).   Skin cancer of arm, left    "burned it off" (01/04/2017)   TIA (transient ischemic attack)    L face and L arm weakness. Peri procedural->a. 03/22/2018  following cath. CT head neg. No MRI b/c has pacer. Likely due to embolus to distal branch of RMCA   Type II diabetes mellitus (HCC)     Past Surgical History:  Procedure Laterality Date   ABI's Bilateral 05/21/2018   normal   BACK SURGERY     BIV ICD INSERTION CRT-D N/A 01/04/2017   Procedure: BiV ICD ;  Surgeon: Regan Lemming, MD;  Location: [facility] INVASIVE CV LAB;  Service: Cardiovascular;  Laterality: N/A;   CARDIAC CATHETERIZATION N/A 06/14/2016   Minimal nonobstructive dz, EF 25-35%.  Procedure: Left Heart Cath and Coronary Angiography;  Surgeon: Peter M Swaziland, MD;  Location: [facility] INVASIVE CV LAB;  Service: Cardiovascular;  Laterality: N/A;   CARDIOVASCULAR STRESS TEST  2012   2012 nuclear perfusion study: low risk scan; 04/2016 normal myocardial perfusion imaging, EF 32%.   CARDIOVERSION  07/09/2012   Procedure: CARDIOVERSION;  Surgeon: Thurmon Fair, MD;  Location: [facility] ENDOSCOPY;  Service: Cardiovascular;  Laterality: N/A;   CATARACT EXTRACTION W/ INTRAOCULAR LENS IMPLANT & ANTERIOR VITRECTOMY, BILATERAL Bilateral    CIRCUMCISION N/A 03/23/2020   Procedure: CIRCUMCISION ADULT;  Surgeon: Belva Agee, MD;  Location: [facility] ORS;  Service: Urology;  Laterality: N/A;   COLONOSCOPY W/ POLYPECTOMY  approx 2006; repeated 09/2011   Polyps on 2013 EGD as well, repeat 12/2014   COLONOSCOPY WITH PROPOFOL N/A 07/08/2021   adenoma x 1. Procedure: COLONOSCOPY WITH PROPOFOL;  Surgeon: Jeani Hawking, MD;  Location: [facility] ENDOSCOPY;  Service: Endoscopy;  Laterality: N/A;   ESOPHAGOGASTRODUODENOSCOPY  10/18/2006   Done due to chronic GERD: Normal, bx showed no barrett's esophagus (Dr. Elnoria Howard)   EYE SURGERY  01/08/2023   FLEXOR TENDON REPAIR Left 10/02/2016   Procedure: LEFT RING FINGER WOUND EXPORATION AND FLEXOR TENDON REPAIR AND NERVE REPAIR;  Surgeon: Mack Hook, MD;  Location: [facility] OR;  Service: Orthopedics;  Laterality: Left;   INSERT / REPLACE / REMOVE PACEMAKER  02/05/2012   dual chamber,  sinus node dysfunction, sinus arrest, PAF, Medtronic Revo serial#[id number] H: last checked 05/2015   LUMBAR LAMINECTOMY Left 1976   L4-5   PACEMAKER REMOVAL  01/04/2017   PERMANENT PACEMAKER INSERTION N/A 02/05/2012   Procedure: PERMANENT PACEMAKER INSERTION;  Surgeon: Thurmon Fair, MD; Generator Medtronic [place] model [place] serial number [id number] H Laterality: N/A;   POLYPECTOMY  07/08/2021   Procedure: POLYPECTOMY;  Surgeon: Jeani Hawking, MD;  Location: [facility] ENDOSCOPY;  Service: Endoscopy;;   RETINAL DETACHMENT SURGERY Left ~ 1999   REVERSE SHOULDER ARTHROPLASTY Left 2018  Left shoulder reverse [facility] in [place]).   RIGHT/LEFT HEART CATH AND CORONARY ANGIOGRAPHY N/A 03/22/2018   EF 30-35%, no CAD.  Procedure: RIGHT/LEFT HEART CATH AND CORONARY ANGIOGRAPHY;  Surgeon: Dolores Patty, MD;  Location: [facility] INVASIVE CV LAB;  Service: Cardiovascular;  Laterality: N/A;   TRANSTHORACIC ECHOCARDIOGRAM  08/25/10; 05/2012; 03/23/16;12/2017   mild asymmetric LVH, normal systolic function, normal diastolic fxn, mild-to-mod mitral regurg, mild aortic valve sclerosis and trace AI, mild aortic root dilatation. 2014 f/u showed EF 40-45%, mod LAE, A FIB.  02/2016 EF 40%, diffuse hypokinesis, grade 2 DD. 12/2017 EF 35-40%,diffuse hypokin,grd III DD, mild MR   WISDOM TOOTH EXTRACTION  06/27/2021   EF 50-55%, mild aortic root/ascending aorta dilatation (41-42 mm).    Current Medications: Outpatient Medications Prior to Visit  Medication Sig Dispense Refill   acetaminophen (TYLENOL) 325 MG tablet Take 650 mg by mouth every 6 (six) hours as needed for moderate pain.     BD INSULIN SYRINGE U/F 31G X 5/16" 1 ML MISC USE DAILY AS DIRECTED 100 each 3   COMBIGAN 0.2-0.5 % ophthalmic solution Place 1 drop into both eyes at bedtime.      Continuous Blood Gluc Receiver (FREESTYLE LIBRE 2 READER) DEVI 1 each by Does not apply route daily. 1 each 3   Continuous Blood Gluc Sensor (FREESTYLE LIBRE 2 SENSOR)  MISC 1 each by Does not apply route every 14 (fourteen) days. 6 each 3   diclofenac Sodium (VOLTAREN) 1 % GEL Apply 4 g topically 4 (four) times daily. 100 g 1   Dulaglutide (TRULICITY) 3 MG/0.5ML SOPN Inject 3 mg into the skin once a week. 6 mL 3   empagliflozin (JARDIANCE) 10 MG TABS tablet Take 1 tablet (10 mg total) by mouth daily. 10 tablet 0   ENTRESTO 97-103 MG TAKE 1 TABLET TWO TIMES A DAY 180 tablet 2   eplerenone (INSPRA) 25 MG tablet TAKE 1 TABLET DAILY 90 tablet 3   gabapentin (NEURONTIN) 300 MG capsule Take 2 capsules (600 mg total) by mouth 2 (two) times daily. 360 capsule 1   insulin aspart (NOVOLOG FLEXPEN) 100 UNIT/ML FlexPen Inject 10 units before each meal 30 mL 3   ketoconazole (NIZORAL) 2 % cream Apply 1 Application topically daily.     ketorolac (ACULAR) 0.5 % ophthalmic solution      LANTUS 100 UNIT/ML injection INJECT 40 TO 45 UNITS TOTAL UNDER THE SKIN DAILY (Patient taking differently: 42 Units daily.) 40 mL 3   lidocaine (LIDODERM) 5 % PLACE 1 PATCH ON THE SKIN DAILY. REMOVE AND DISCARD PATCH WITHIN 12 HOURS OR AS DIRECTED BY DOCTOR 90 patch 1   liraglutide (VICTOZA) 18 MG/3ML SOPN Inject 1.8 mg into the skin daily. 27 mL 2   meclizine (ANTIVERT) 25 MG tablet TAKE 1 TABLET THREE TIMES A DAY AS NEEDED FOR DIZZINESS 270 tablet 0   metoprolol succinate (TOPROL-XL) 50 MG 24 hr tablet TAKE 1 TABLET DAILY. TAKE WITH OR IMMEDIATELY FOLLLOWING A MEAL 90 tablet 0   omeprazole (PRILOSEC) 40 MG capsule 1 cap po bid 180 capsule 1   oxybutynin (DITROPAN-XL) 10 MG 24 hr tablet TAKE 1 TABLET AT BEDTIME 90 tablet 3   oxyCODONE-acetaminophen (PERCOCET) 10-325 MG tablet 1/2-1 tab po tid prn pain 30 tablet 0   rivaroxaban (XARELTO) 20 MG TABS tablet TAKE 1 TABLET DAILY WITH SUPPER 90 tablet 1   ROCKLATAN 0.02-0.005 % SOLN Apply to eye.     rOPINIRole (REQUIP) 1 MG tablet  TAKE 1 TABLET DAILY 90 tablet 1   rosuvastatin (CRESTOR) 20 MG tablet TAKE 1 TABLET DAILY 90 tablet 3   Semaglutide,  1 MG/DOSE, 4 MG/3ML SOPN Inject 1 mg as directed once a week. 9 mL 3   sertraline (ZOLOFT) 100 MG tablet TAKE 1 TABLET DAILY 90 tablet 1   SURE COMFORT PEN NEEDLES 31G X 5 MM MISC USE TO INJECT INSULIN UNDER THE SKIN 100 each 11   No facility-administered medications prior to visit.     Allergies:   Patient has no known allergies.   Social History   Socioeconomic History   Marital status: Married    Spouse name: Not on file   Number of children: Not on file   Years of education: Not on file   Highest education level: 12th grade  Occupational History   Not on file  Tobacco Use   Smoking status: Never   Smokeless tobacco: Never  Vaping Use   Vaping status: Never Used  Substance and Sexual Activity   Alcohol use: Yes    Comment: occ   Drug use: No   Sexual activity: Not on file  Other Topics Concern   Not on file  Social History Narrative   Not on file   Social Determinants of Health   Financial Resource Strain: Low Risk  (09/10/2022)   Overall Financial Resource Strain (CARDIA)    Difficulty of Paying Living Expenses: Not hard at all  Food Insecurity: No Food Insecurity (09/10/2022)   Hunger Vital Sign    Worried About Running Out of Food in the Last Year: Never true    Ran Out of Food in the Last Year: Never true  Transportation Needs: No Transportation Needs (09/10/2022)   PRAPARE - Administrator, Civil Service (Medical): No    Lack of Transportation (Non-Medical): No  Physical Activity: Inactive (09/10/2022)   Exercise Vital Sign    Days of Exercise per Week: 0 days    Minutes of Exercise per Session: 0 min  Stress: Stress Concern Present (09/10/2022)   [employer] of Occupational Health - Occupational Stress Questionnaire    Feeling of Stress : To some extent  Social Connections: Socially Integrated (09/10/2022)   Social Connection and Isolation Panel [NHANES]    Frequency of Communication with Friends and Family: More than three times a week     Frequency of Social Gatherings with Friends and Family: More than three times a week    Attends Religious Services: More than 4 times per year    Active Member of [employer] or Organizations: Yes    Attends Engineer, structural: More than 4 times per year    Marital Status: Married     Family History:  The patient's family history includes Cancer in his brother, brother, and sister; Heart disease in his brother, sister, and sister; Heart failure in his mother; Stroke in his father and mother.   ROS:   Please see the history of present illness.    ROS All other systems are reviewed and are negative.   PHYSICAL EXAM:   VS:  There were no vitals taken for this visit.     General: Alert, oriented x3, no distress, healthy left subclavian device site Head: no evidence of trauma, PERRL, EOMI, no exophtalmos or lid lag, no myxedema, no xanthelasma; normal ears, nose and oropharynx Neck: normal jugular venous pulsations and no hepatojugular reflux; brisk carotid pulses without delay and no carotid bruits Chest: clear to auscultation,  no signs of consolidation by percussion or palpation, normal fremitus, symmetrical and full respiratory excursions Cardiovascular: normal position and quality of the apical impulse, regular rhythm, normal first and second heart sounds, no murmurs, rubs or gallops Abdomen: no tenderness or distention, no masses by palpation, no abnormal pulsatility or arterial bruits, normal bowel sounds, no hepatosplenomegaly Extremities: no clubbing, cyanosis or edema; 2+ radial, ulnar and brachial pulses bilaterally; 2+ right femoral, posterior tibial and dorsalis pedis pulses; 2+ left femoral, posterior tibial and dorsalis pedis pulses; no subclavian or femoral bruits Neurological: grossly nonfocal Psych: Normal mood and affect    Wt Readings from Last 3 Encounters:  03/28/23 227 lb (103 kg)  03/20/23 226 lb 9.6 oz (102.8 kg)  02/08/23 230 lb 9.6 oz (104.6 kg)      Studies/Labs Reviewed:   ECHO 06/29/2021:   1. Left ventricular ejection fraction, by estimation, is 50 to 55%. Left  ventricular ejection fraction by 3D volume is 53 %. The left ventricle has  low normal function. The left ventricle has no regional wall motion  abnormalities. Left ventricular  diastolic parameters are indeterminate. The average left ventricular  global longitudinal strain is -9.1 %. The global longitudinal strain is  abnormal.   2. Right ventricular systolic function is mildly reduced. The right  ventricular size is mildly enlarged.   3. Left atrial size was severely dilated.   4. Right atrial size was severely dilated.   5. The mitral valve is normal in structure. Mild mitral valve  regurgitation.   6. The aortic valve is tricuspid. Aortic valve regurgitation is trivial.   7. Aortic dilatation noted. 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 There is mild dilatation of  the ascending aorta, measuring 41 mm.   Comparison(s): Prior images reviewed side by side. The left ventricular  function has improved.   EKG: EKG is ordered today.  Personally reviewed shows atrial fibrillation with 100% biventricular pacing and a distinct positive R wave in lead V1, QRS 176 ms, QTC 511 ms.  Recent Labs: 09/11/2022: ALT 18 12/04/2022: BUN 22; Creatinine, Ser 1.52; Potassium 5.1; Sodium 141   Lipid Panel    Component Value Date/Time   CHOL 132 09/11/2022 1115   TRIG 161.0 (H) 09/11/2022 1115   HDL 45.80 09/11/2022 1115   CHOLHDL 3 09/11/2022 1115   VLDL 32.2 09/11/2022 1115   LDLCALC 54 09/11/2022 1115   LDLDIRECT 103.0 06/22/2020 1015     ASSESSMENT:    No diagnosis found.    PLAN:  In order of problems listed above:  CHF: Clinically euvolemic.  OptiVol in normal range, with just a slight upward trend over the last 2-3 weeks or so, but this has returned to normal. On maximum dose Entresto, metoprolol succinate,  empagliflozin and eplerenone (switched from spironolactone due to gynecomastia).  Reviewed sodium dietary restriction, daily weight monitoring, symptoms of heart failure exacerbation and "as needed" dosing of loop diuretics, for overnight weight gain of 3 pounds or 5 pounds weight gain in a week. CHB: He is pacemaker dependent. AFib: Permanent arrhythmia.  Severe biatrial enlargement.  Appropriately anticoagulated.  CHADSVasc 5 (age 84, diabetes, heart failure, hypertension).  No history of embolic events. Xarelto: No bleeding events, compliant with the medication. CRT-D: Normal device function.  Good heart rate histogram distribution (100% paced). HTN: Borderline low blood pressure, has minimum symptoms of orthostatic hypotension. PAD: Asymptomatic/no claudication.  Very mild abnormality in the left lower extremity  ABI.   DM/obesity/dyslipidemia: Slight deterioration in glycemic control is associated with a mild increase in triglycerides.  Low HDL is a chronic abnormality.  Weight loss will be beneficial.  Usually has metabolic values monitored at the [facility], or by Dr. Lear Ng.  They were close to target range last year and probably have improved.  SGLT2 inhibitor should be continued (barring side effects) for both heart failure and diabetes.  On highly effective statin.  LDL in target range.    Medication Adjustments/Labs and Tests Ordered: Current medicines are reviewed at length with the patient today.  Concerns regarding medicines are outlined above.  Medication changes, Labs and Tests ordered today are listed in the Patient Instructions below. There are no Patient Instructions on file for this visit.   Signed, Thurmon Fair, MD  03/29/2023 8:26 AM    [facility] [address] [place], [place], [place]  [address] Phone: [phone]; Fax: [phone]

## 2023-03-29 NOTE — Telephone Encounter (Signed)
Prescription refill request for Xarelto received.  Indication: Afib  Last office visit: 02/13/22 ([place])  Weight: 103kg Age: 82 Scr: 1.52 (12/04/22)  CrCl: 56.39ml/min  Office visit overdue. Pt has scheduled appt today, 03/29/23 with Dr Royann Shivers. Appropriate dose. Refill sent.

## 2023-03-30 ENCOUNTER — Telehealth: Payer: Self-pay | Admitting: Family Medicine

## 2023-03-30 NOTE — Telephone Encounter (Signed)
Patient called to inquire about status update on his recent x-ray. Please give the patient a call when results are available because he states he is still in pain. Please give him a call on his mobile number.

## 2023-03-30 NOTE — Telephone Encounter (Signed)
Please advise 

## 2023-04-02 NOTE — Telephone Encounter (Signed)
Please give the patient a call regarding his X-ray. He is in pain and would like to know what the results are. If the results are not back please give the patient to give him a status update.

## 2023-04-02 NOTE — Telephone Encounter (Signed)
Please advise on pts X ray results

## 2023-04-02 NOTE — Telephone Encounter (Signed)
The x-ray has still not been read by radiologist. We will call him or send MyChart message as soon as it has been read.

## 2023-04-03 ENCOUNTER — Other Ambulatory Visit: Payer: Self-pay | Admitting: Family Medicine

## 2023-04-03 DIAGNOSIS — R0789 Other chest pain: Secondary | ICD-10-CM

## 2023-04-03 DIAGNOSIS — S2232XA Fracture of one rib, left side, initial encounter for closed fracture: Secondary | ICD-10-CM

## 2023-04-03 NOTE — Telephone Encounter (Signed)
Called reading room

## 2023-04-06 ENCOUNTER — Encounter: Payer: Self-pay | Admitting: Family Medicine

## 2023-04-06 ENCOUNTER — Ambulatory Visit (INDEPENDENT_AMBULATORY_CARE_PROVIDER_SITE_OTHER): Payer: Medicare Other | Admitting: Family Medicine

## 2023-04-06 VITALS — BP 110/62 | HR 72 | Temp 98.2°F | Ht 71.0 in | Wt 221.8 lb

## 2023-04-06 DIAGNOSIS — S2232XG Fracture of one rib, left side, subsequent encounter for fracture with delayed healing: Secondary | ICD-10-CM | POA: Diagnosis not present

## 2023-04-06 DIAGNOSIS — Z7901 Long term (current) use of anticoagulants: Secondary | ICD-10-CM

## 2023-04-06 DIAGNOSIS — N1831 Chronic kidney disease, stage 3a: Secondary | ICD-10-CM

## 2023-04-06 DIAGNOSIS — I10 Essential (primary) hypertension: Secondary | ICD-10-CM | POA: Diagnosis not present

## 2023-04-06 LAB — LIPID PANEL
Cholesterol: 116 mg/dL (ref 0–200)
HDL: 42.2 mg/dL (ref >39.00)
LDL Cholesterol: 45 mg/dL (ref 0–99)
NonHDL: 73.93
Total CHOL/HDL Ratio: 3
Triglycerides: 145 mg/dL (ref 0.0–149.0)
VLDL: 29 mg/dL (ref 0.0–40.0)

## 2023-04-06 LAB — COMPREHENSIVE METABOLIC PANEL
ALT: 24 U/L (ref 0–53)
AST: 28 U/L (ref 0–37)
Albumin: 4 g/dL (ref 3.5–5.2)
Alkaline Phosphatase: 78 U/L (ref 39–117)
BUN: 26 mg/dL — ABNORMAL HIGH (ref 6–23)
CO2: 31 meq/L (ref 19–32)
Calcium: 9.7 mg/dL (ref 8.4–10.5)
Chloride: 101 meq/L (ref 96–112)
Creatinine, Ser: 1.67 mg/dL — ABNORMAL HIGH (ref 0.40–1.50)
GFR: 38.36 mL/min — ABNORMAL LOW (ref >60.00)
Glucose, Bld: 111 mg/dL — ABNORMAL HIGH (ref 70–99)
Potassium: 4.7 meq/L (ref 3.5–5.1)
Sodium: 140 meq/L (ref 135–145)
Total Bilirubin: 0.6 mg/dL (ref 0.2–1.2)
Total Protein: 7.3 g/dL (ref 6.0–8.3)

## 2023-04-06 LAB — CBC
HCT: 49.9 % (ref 39.0–52.0)
Hemoglobin: 16.2 g/dL (ref 13.0–17.0)
MCHC: 32.4 g/dL (ref 30.0–36.0)
MCV: 92.6 fl (ref 78.0–100.0)
Platelets: 148 10*3/uL — ABNORMAL LOW (ref 150.0–400.0)
RBC: 5.39 Mil/uL (ref 4.22–5.81)
RDW: 13.8 % (ref 11.5–15.5)
WBC: 6.2 10*3/uL (ref 4.0–10.5)

## 2023-04-06 MED ORDER — OXYCODONE-ACETAMINOPHEN 10-325 MG PO TABS: ORAL_TABLET | ORAL | 0 refills | Status: DC

## 2023-04-06 NOTE — Progress Notes (Signed)
OFFICE VISIT  04/06/2023  CC:  Chief Complaint  Patient presents with   Medical Management of Chronic Issues    Pt is fasting    Patient is a 82 y.o. male who presents for 10-month follow-up hypertension, chronic renal insufficiency, hypercholesterolemia and 9-day follow-up of recent left-sided posterolateral chest wall pain.  INTERIM HX: Recent rib films with question of nondisplaced rib fractures.  No preceding trauma is recalled by patient. I ordered a bone scan to further evaluate. He is still in severe pain, focally in left lateral chest wall. If he takes a whole Percocet 10/325 tab it does bring relief for a few hours.  Hurts much worse when he tries to get up and move around.  Recent rib films with question of nondisplaced rib fractures.  No preceding trauma is recalled by patient. I ordered a bone scan to further evaluate.  ROS as above, plus--> no fevers,  no SOB, no wheezing, no cough, no dizziness, no HAs, no rashes, no melena/hematochezia.  No polyuria or polydipsia.   No focal weakness, paresthesias, or tremors.  No acute vision or hearing abnormalities.  No dysuria or unusual/new urinary urgency or frequency.  No recent changes in lower legs. No n/v/d or abd pain.  No palpitations.     Past Medical History:  Diagnosis Date   AICD (automatic cardioverter/defibrillator) present 2018   MDT CRT-D.  Fatigue-->completely pacer dependent.  Pacer settings adjusted 12/2017 to allow more chronotrophc variance with ADL's//exertion.   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   02/2016 EF 40%, diffuse hypokinesis, grade 2 DD.  Myoc perf imaging showed EF 32% 04/2016.  Pt upgraded to CRT-D 01/04/17.    Chronic renal insufficiency, stage III (moderate) (HCC) 2015   CrCl about 60 ml/min   Complete heart block (HCC)    Has dual chamber pacer.   COVID-19 virus infection 01/05/2021   paxlovid   Depression    DOE (dyspnea on exertion)    NYHA class II/III CHF   Dyspnea 2021   with exertion, bending over   Episodic low back pain 01/22/2013   w/intermittent radiculitis (12/2014 his neurologist referred him to pain mgmt for epidural steroid injection)   Erectile dysfunction 2019   due to zoloft--urol rx'd viagra   GERD (gastroesophageal reflux disease)    H/O tilt table evaluation 11/02/2005   negative   Helicobacter pylori gastritis 01/2016   History of adenomatous polyp of colon 10/12/2011   Dr. Elnoria Howard (3 right side of colon- tubular adenomas removed)   History of cardiovascular stress test 05/28/2012   no ischemia, EF 37%, imaging results are unchanged and within normal variance   History of chronic prostatitis    History of kidney stones    History of vertigo    + Hx of posterior HA's.  Neuro (Dr. Sandria Manly) eval 2011.  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   Hyperlipidemia    Hypertension    Lumbar spondylosis    lumbosacral radiculopathy at L4 by EMG testing, right foot drop (neurologist is Dr. Luberta Robertson with [facility] in [place])--neurologist referred him to neurosurgery   Migraine    "used to have them all  the time; none for years" (01/04/2017)   Myocardial infarction [facility]) ?1970s   not entirely certain of this   Nephrolithiasis 07/2012   Left UVJ 2 mm stone with dilation of renal collecting system and slight hydroureter on right   Neuropathy    NICM (nonischemic cardiomyopathy) (HCC)    a. 02/2018 Cath: LM nl, LAD min irregs, LCX no, RCA 20d. [id number]. Fick CO/CI 4.4/2.0.   Osteoarthritis, multiple sites    Shoulders, back, knees   Pacemaker 02/05/2012   dual chamber, complete heart block,  meddtronic revo, lasted checked 12/2015.  Since no CAD on cath 05/2016, cards recommends upgrade to CRT-D.   Permanent atrial fibrillation (HCC)    DCCV 07/09/13-converted, lasted two days, then back into afib--needs lifetime anticoagulation (Xarelto as of 09/2014)   Prostate cancer screening 09/2017   done by urol annually (normal prostate exam documented + PSA 0.84 as of 10/01/17 urol f/u.  10/2018 urol f/u PSA 0.6, no prostate nodule.   Rectus diastasis    Right ankle sprain 08/2017   w/distal fibula avulsion fx noted on u/s but not plain film-(Dr. Hudnall).   Skin cancer of arm, left    "burned it off" (01/04/2017)   TIA (transient ischemic attack)    L face and L arm weakness. Peri procedural->a. 03/22/2018 following cath. CT head neg. No MRI b/c has pacer. Likely due to embolus to distal branch of RMCA   Type II diabetes mellitus (HCC)     Past Surgical History:  Procedure Laterality Date   ABI's Bilateral 05/21/2018   normal   BACK SURGERY     BIV ICD INSERTION CRT-D N/A 01/04/2017   Procedure: BiV ICD ;  Surgeon: Regan Lemming, MD;  Location: [facility] INVASIVE CV LAB;  Service: Cardiovascular;  Laterality: N/A;   CARDIAC CATHETERIZATION N/A 06/14/2016   Minimal nonobstructive dz, EF 25-35%.  Procedure: Left Heart Cath and Coronary Angiography;  Surgeon: Peter M Swaz[place]and, MD;  Location: [facility] INVASIVE CV LAB;  Service: Cardiovascular;  Laterality: N/A;   CARDIOVASCULAR STRESS TEST  2012   2012 nuclear perfusion study: low risk scan; 04/2016 normal myocardial perfusion imaging, EF 32%.   CARDIOVERSION  07/09/2012   Procedure: CARDIOVERSION;  Surgeon: Thurmon Fair, MD;  Location: [facility] ENDOSCOPY;  Service: Cardiovascular;  Laterality: N/A;   CATARACT EXTRACTION W/ INTRAOCULAR LENS IMPLANT & ANTERIOR VITRECTOMY, BILATERAL Bilateral    CIRCUMCISION N/A 03/23/2020   Procedure: CIRCUMCISION ADULT;  Surgeon: Belva Agee, MD;  Location: [facility] ORS;  Service: Urology;  Laterality: N/A;    COLONOSCOPY W/ POLYPECTOMY  approx 2006; repeated 09/2011   Polyps on 2013 EGD as well, repeat 12/2014   COLONOSCOPY WITH PROPOFOL N/A 07/08/2021   adenoma x 1. Procedure: COLONOSCOPY WITH PROPOFOL;  Surgeon: Jeani Hawking, MD;  Location: [facility] ENDOSCOPY;  Service: Endoscopy;  Laterality: N/A;   ESOPHAGOGASTRODUODENOSCOPY  10/18/2006   Done due to chronic GERD: Normal, bx showed no barrett's esophagus (Dr. Elnoria Howard)   EYE SURGERY  01/08/2023   FLEXOR TENDON REPAIR Left 10/02/2016   Procedure: LEFT RING FINGER WOUND EXPORATION AND FLEXOR TENDON REPAIR AND NERVE REPAIR;  Surgeon: Mack Hook, MD;  Location: [facility] OR;  Service: Orthopedics;  Laterality: Left;   INSERT / REPLACE / REMOVE PACEMAKER  02/05/2012   dual chamber, sinus node dysfunction, sinus arrest, PAF, Medtronic Revo serial#[id number] H: last checked 05/2015   LUMBAR LAMINECTOMY Left 1976   L4-5   PACEMAKER REMOVAL  01/04/2017   PERMANENT PACEMAKER INSERTION N/A 02/05/2012   Procedure: PERMANENT  PACEMAKER INSERTION;  Surgeon: Thurmon Fair, MD; Generator Medtronic [place] model [place] serial number [id number] H Laterality: N/A;   POLYPECTOMY  07/08/2021   Procedure: POLYPECTOMY;  Surgeon: Jeani Hawking, MD;  Location: [facility] ENDOSCOPY;  Service: Endoscopy;;   RETINAL DETACHMENT SURGERY Left ~ 1999   REVERSE SHOULDER ARTHROPLASTY Left 2018   Left shoulder reverse [facility] in [place]).   RIGHT/LEFT HEART CATH AND CORONARY ANGIOGRAPHY N/A 03/22/2018   EF 30-35%, no CAD.  Procedure: RIGHT/LEFT HEART CATH AND CORONARY ANGIOGRAPHY;  Surgeon: Dolores Patty, MD;  Location: [facility] INVASIVE CV LAB;  Service: Cardiovascular;  Laterality: N/A;   TRANSTHORACIC ECHOCARDIOGRAM  08/25/10; 05/2012; 03/23/16;12/2017   mild asymmetric LVH, normal systolic function, normal diastolic fxn, mild-to-mod mitral regurg, mild aortic valve sclerosis and trace AI, mild aortic root dilatation. 2014 f/u showed EF 40-45%, mod LAE, A FIB.  02/2016 EF 40%, diffuse  hypokinesis, grade 2 DD. 12/2017 EF 35-40%,diffuse hypokin,grd III DD, mild MR   WISDOM TOOTH EXTRACTION  06/27/2021   EF 50-55%, mild aortic root/ascending aorta dilatation (41-42 mm).    Outpatient Medications Prior to Visit  Medication Sig Dispense Refill   acetaminophen (TYLENOL) 325 MG tablet Take 650 mg by mouth every 6 (six) hours as needed for moderate pain.     BD INSULIN SYRINGE U/F 31G X 5/16" 1 ML MISC USE DAILY AS DIRECTED 100 each 3   COMBIGAN 0.2-0.5 % ophthalmic solution Place 1 drop into both eyes at bedtime.      Continuous Blood Gluc Receiver (FREESTYLE LIBRE 2 READER) DEVI 1 each by Does not apply route daily. 1 each 3   Continuous Blood Gluc Sensor (FREESTYLE LIBRE 2 SENSOR) MISC 1 each by Does not apply route every 14 (fourteen) days. 6 each 3   diclofenac Sodium (VOLTAREN) 1 % GEL Apply 4 g topically 4 (four) times daily. 100 g 1   empagliflozin (JARDIANCE) 10 MG TABS tablet Take 1 tablet (10 mg total) by mouth daily. 10 tablet 0   ENTRESTO 97-103 MG TAKE 1 TABLET TWO TIMES A DAY 180 tablet 2   eplerenone (INSPRA) 25 MG tablet TAKE 1 TABLET DAILY 90 tablet 3   gabapentin (NEURONTIN) 300 MG capsule Take 2 capsules (600 mg total) by mouth 2 (two) times daily. 360 capsule 1   insulin aspart (NOVOLOG FLEXPEN) 100 UNIT/ML FlexPen Inject 10 units before each meal 30 mL 3   ketorolac (ACULAR) 0.5 % ophthalmic solution      LANTUS 100 UNIT/ML injection INJECT 40 TO 45 UNITS TOTAL UNDER THE SKIN DAILY (Patient taking differently: 42 Units daily.) 40 mL 3   lidocaine (LIDODERM) 5 % PLACE 1 PATCH ON THE SKIN DAILY. REMOVE AND DISCARD PATCH WITHIN 12 HOURS OR AS DIRECTED BY DOCTOR 90 patch 1   liraglutide (VICTOZA) 18 MG/3ML SOPN Inject 1.8 mg into the skin daily. 27 mL 2   meclizine (ANTIVERT) 25 MG tablet TAKE 1 TABLET THREE TIMES A DAY AS NEEDED FOR DIZZINESS 270 tablet 0   metoprolol succinate (TOPROL-XL) 50 MG 24 hr tablet TAKE 1 TABLET DAILY. TAKE WITH OR IMMEDIATELY FOLLLOWING  A MEAL 90 tablet 0   omeprazole (PRILOSEC) 40 MG capsule 1 cap po bid 180 capsule 1   oxybutynin (DITROPAN-XL) 10 MG 24 hr tablet TAKE 1 TABLET AT BEDTIME 90 tablet 3   oxyCODONE-acetaminophen (PERCOCET) 10-325 MG tablet 1/2-1 tab po tid prn pain 30 tablet 0   rivaroxaban (XARELTO) 20 MG TABS tablet TAKE 1 TABLET DAILY WITH SUPPER  90 tablet 1   ROCKLATAN 0.02-0.005 % SOLN Apply to eye.     rOPINIRole (REQUIP) 1 MG tablet TAKE 1 TABLET DAILY 90 tablet 1   rosuvastatin (CRESTOR) 20 MG tablet TAKE 1 TABLET DAILY 90 tablet 3   Semaglutide, 1 MG/DOSE, 4 MG/3ML SOPN Inject 1 mg as directed once a week. 9 mL 3   sertraline (ZOLOFT) 100 MG tablet TAKE 1 TABLET DAILY 90 tablet 1   SURE COMFORT PEN NEEDLES 31G X 5 MM MISC USE TO INJECT INSULIN UNDER THE SKIN 100 each 11   Dulaglutide (TRULICITY) 3 MG/0.5ML SOPN Inject 3 mg into the skin once a week. (Patient not taking: Reported on 04/06/2023) 6 mL 3   ketoconazole (NIZORAL) 2 % cream Apply 1 Application topically daily. (Patient not taking: Reported on 04/06/2023)     No facility-administered medications prior to visit.    No Known Allergies  Review of Systems As per HPI  PE:    04/06/2023    9:01 AM 04/06/2023    8:58 AM 03/28/2023   10:25 AM  Vitals with BMI  Height  5\' 11"  5\' 11"   Weight  221 lbs 13 oz 227 lbs  BMI  30.95 31.67  Systolic 110 78 110  Diastolic 62 57 62  Pulse  72 74     Physical Exam  Gen: Alert, well appearing.  Patient is oriented to person, place, time, and situation. AFFECT: pleasant, lucid thought and speech. Focal TTP over lower ribs posterolaterally.  No bruising or erythema or palpable deformity.  LABS:  Last CBC Lab Results  Component Value Date   WBC 5.6 06/09/2021   HGB 16.0 06/09/2021   HCT 49.2 06/09/2021   MCV 92.5 06/09/2021   MCH 30.3 03/19/2020   RDW 13.6 06/09/2021   PLT 129.0 (L) 06/09/2021   Last metabolic panel Lab Results  Component Value Date   GLUCOSE 65 (L) 12/04/2022   NA 141  12/04/2022   K 5.1 12/04/2022   CL 102 12/04/2022   CO2 32 12/04/2022   BUN 22 12/04/2022   CREATININE 1.52 (H) 12/04/2022   GFR 43.05 (L) 12/04/2022   CALCIUM 9.7 12/04/2022   PROT 7.6 09/11/2022   ALBUMIN 4.3 09/11/2022   BILITOT 0.6 09/11/2022   ALKPHOS 89 09/11/2022   AST 19 09/11/2022   ALT 18 09/11/2022   ANIONGAP 9 03/23/2020   Last lipids Lab Results  Component Value Date   CHOL 132 09/11/2022   HDL 45.80 09/11/2022   LDLCALC 54 09/11/2022   LDLDIRECT 103.0 06/22/2020   TRIG 161.0 (H) 09/11/2022   CHOLHDL 3 09/11/2022   Last hemoglobin A1c Lab Results  Component Value Date   HGBA1C 7.1 (A) 02/08/2023   Last thyroid functions Lab Results  Component Value Date   TSH 2.963 04/04/2018   IMPRESSION AND PLAN:  #1 left posterolateral focal chest wall pain. Radiographs showed possible eighth rib fracture that correlates with his tenderness. No recent trauma to the area, no recent severe coughing. For further workup I ordered a bone scan on 04/03/2023 but apparently this cannot be done at [facility] so I have reordered it today for [facility] imaging.  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  Interestingly, right sided rib films in April 2023 showed mild deformity in the right 10th, 11th, and 12th ribs without break in the cortical margins which may suggest  old healed fractures.  A follow-up CT scan did not show any rib abnormalities. He saw neurosurgery for that and PT was recommended but he says this did not help.  Eventually that pain spontaneously resolved.  Refilled Percocet 10/325, and he can take 1-1/2-2 at a time up to 3 times a day. He is already on gabapentin for his neuropathic pain in his feet. Considered addition of Cymbalta for treatment of possible neuropathic pain but he is already taking 100 mg of sertraline.  #2  hypertension, well-controlled on Toprol XL 50 mg a day and Entresto 97-103 1 tab twice daily. Electrolytes and creatinine monitoring today.  3.  Chronic renal insufficiency stage III. Baseline GFR over the last few years is 45. Avoid NSAIDs. Electrolytes and creatinine monitoring today.  4.  Hypercholesterolemia, doing well on rosuvastatin 20 mg a day Lipid panel and hepatic panel today.  An After Visit Summary was printed and given to the patient.  FOLLOW UP: No follow-ups on file.  Signed:  Santiago Bumpers, MD           04/06/2023

## 2023-04-09 ENCOUNTER — Telehealth: Payer: Self-pay

## 2023-04-09 NOTE — Telephone Encounter (Signed)
Good morning!  I wanted to check with you and see if you knew the process for patients being scheduled for imaging at [facility]. I called centralized scheduling [phone]) and they said depending on the office, some offices call and schedule the patient or advise the patient to call and schedule.

## 2023-04-09 NOTE — Telephone Encounter (Signed)
Awesome! Thank you!

## 2023-04-09 NOTE — Telephone Encounter (Signed)
He needs to be scheduled, if you don't mind.

## 2023-04-10 ENCOUNTER — Other Ambulatory Visit: Payer: Self-pay | Admitting: Family Medicine

## 2023-04-10 MED ORDER — NALOXONE HCL 4 MG/0.1ML NA LIQD: NASAL | 1 refills | Status: DC

## 2023-04-12 ENCOUNTER — Encounter: Payer: Self-pay | Admitting: Cardiovascular Disease

## 2023-04-12 ENCOUNTER — Telehealth: Payer: Self-pay

## 2023-04-12 ENCOUNTER — Ambulatory Visit: Payer: Medicare Other | Attending: Internal Medicine | Admitting: Cardiovascular Disease

## 2023-04-12 VITALS — BP 130/82 | HR 71 | Ht 71.0 in | Wt 230.0 lb

## 2023-04-12 DIAGNOSIS — I10 Essential (primary) hypertension: Secondary | ICD-10-CM | POA: Diagnosis not present

## 2023-04-12 DIAGNOSIS — I5022 Chronic systolic (congestive) heart failure: Secondary | ICD-10-CM | POA: Insufficient documentation

## 2023-04-12 DIAGNOSIS — I4821 Permanent atrial fibrillation: Secondary | ICD-10-CM | POA: Insufficient documentation

## 2023-04-12 DIAGNOSIS — I442 Atrioventricular block, complete: Secondary | ICD-10-CM | POA: Insufficient documentation

## 2023-04-12 DIAGNOSIS — I739 Peripheral vascular disease, unspecified: Secondary | ICD-10-CM | POA: Insufficient documentation

## 2023-04-12 DIAGNOSIS — D6869 Other thrombophilia: Secondary | ICD-10-CM | POA: Diagnosis not present

## 2023-04-12 DIAGNOSIS — Z95810 Presence of automatic (implantable) cardiac defibrillator: Secondary | ICD-10-CM | POA: Diagnosis not present

## 2023-04-12 DIAGNOSIS — E1151 Type 2 diabetes mellitus with diabetic peripheral angiopathy without gangrene: Secondary | ICD-10-CM | POA: Diagnosis not present

## 2023-04-12 DIAGNOSIS — E785 Hyperlipidemia, unspecified: Secondary | ICD-10-CM | POA: Insufficient documentation

## 2023-04-12 DIAGNOSIS — M5416 Radiculopathy, lumbar region: Secondary | ICD-10-CM | POA: Diagnosis not present

## 2023-04-12 NOTE — Patient Instructions (Signed)
Medication Instructions:  No changes *If you need a refill on your cardiac medications before your next appointment, please call your pharmacy*  Follow-Up: At [facility], you and your health needs are our priority.  As part of our continuing mission to provide you with exceptional heart care, we have created designated Provider Care Teams.  These Care Teams include your primary Cardiologist (physician) and Advanced Practice Providers (APPs -  Physician Assistants and Nurse Practitioners) who all work together to provide you with the care you need, when you need it.  We recommend signing up for the patient portal called "MyChart".  Sign up information is provided on this After Visit Summary.  MyChart is used to connect with patients for Virtual Visits (Telemedicine).  Patients are able to view lab/test results, encounter notes, upcoming appointments, etc.  Non-urgent messages can be sent to your provider as well.   To learn more about what you can do with MyChart, go to [email or web address].    Your next appointment:   1 year(s)  Provider:   Thurmon Fair, MD

## 2023-04-12 NOTE — Telephone Encounter (Signed)
Spoke with Mason [name] and due to patient's hx of back pain and locations of pain, he wanted to confirm if you wanted the order changed to   1) 3 phase bone scan 2) keep same order (limited bone scan) 3) whole body bone scan  Please review and advise.
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
## 2023-04-12 NOTE — Telephone Encounter (Signed)
Theodoro Grist calling from [facility] Dept. Calling in regards to patient appt tomorrow.  Needing clarity of what Dr. Milinda Cave has ordered for patient.  Please call [phone], ask for Theodoro Grist.

## 2023-04-13 ENCOUNTER — Ambulatory Visit (HOSPITAL_COMMUNITY)
Admission: RE | Admit: 2023-04-13 | Discharge: 2023-04-13 | Disposition: A | Discharge status: Home / Self Care | Payer: Medicare Other | Source: Ambulatory Visit | Attending: Family Medicine | Admitting: Family Medicine

## 2023-04-13 ENCOUNTER — Other Ambulatory Visit (HOSPITAL_COMMUNITY): Payer: Self-pay | Admitting: Student

## 2023-04-13 ENCOUNTER — Encounter: Payer: Self-pay | Admitting: Family Medicine

## 2023-04-13 ENCOUNTER — Encounter (HOSPITAL_COMMUNITY)
Admission: RE | Admit: 2023-04-13 | Discharge: 2023-04-13 | Disposition: A | Discharge status: Home / Self Care | Payer: Medicare Other | Source: Ambulatory Visit | Attending: Family Medicine | Admitting: Family Medicine

## 2023-04-13 DIAGNOSIS — S2232XG Fracture of one rib, left side, subsequent encounter for fracture with delayed healing: Secondary | ICD-10-CM | POA: Diagnosis not present

## 2023-04-13 DIAGNOSIS — R0781 Pleurodynia: Secondary | ICD-10-CM | POA: Diagnosis not present

## 2023-04-13 DIAGNOSIS — M5416 Radiculopathy, lumbar region: Secondary | ICD-10-CM

## 2023-04-13 MED ORDER — TECHNETIUM TC 99M MEDRONATE IV KIT
22.0000 | PACK | Freq: Once | INTRAVENOUS | Status: AC | PRN
  Administered 2023-04-13: 22 via INTRAVENOUS

## 2023-04-16 NOTE — Telephone Encounter (Signed)
Don, I am unable do anything to a pacemaker/defibrillator. It is something your heart doctor (Dr. Jomarie Longs) would have to do. Pls request this to be done through their office.  Also, the other option is to have the provider who ordered the MRI Val Eagle, NP) to get in contact with your heart doctor to get this done. --PM

## 2023-04-18 ENCOUNTER — Other Ambulatory Visit: Payer: Self-pay | Admitting: Neurosurgery

## 2023-04-18 ENCOUNTER — Encounter (HOSPITAL_COMMUNITY): Payer: Self-pay

## 2023-04-18 ENCOUNTER — Ambulatory Visit (HOSPITAL_COMMUNITY): Admission: RE | Admit: 2023-04-18 | Payer: Medicare Other | Source: Ambulatory Visit

## 2023-04-18 DIAGNOSIS — M5416 Radiculopathy, lumbar region: Secondary | ICD-10-CM

## 2023-04-25 ENCOUNTER — Ambulatory Visit (INDEPENDENT_AMBULATORY_CARE_PROVIDER_SITE_OTHER): Payer: Medicare Other

## 2023-04-25 DIAGNOSIS — I442 Atrioventricular block, complete: Secondary | ICD-10-CM | POA: Diagnosis not present

## 2023-04-25 LAB — CUP PACEART REMOTE DEVICE CHECK
Battery Remaining Longevity: 16 mo
Battery Voltage: 2.89 V
Brady Statistic AP VP Percent: 0 %
Brady Statistic AP VS Percent: 0 %
Brady Statistic AS VP Percent: 0 %
Brady Statistic AS VS Percent: 0 %
Brady Statistic RA Percent Paced: 0 %
Brady Statistic RV Percent Paced: 99.71 %
Date Time Interrogation Session: 20241002080531
HighPow Impedance: 74 Ohm
Implantable Lead Connection Status: 753985
Implantable Lead Connection Status: 753985
Implantable Lead Connection Status: 753985
Implantable Lead Implant Date: 20130715
Implantable Lead Implant Date: 20180614
Implantable Lead Implant Date: 20180614
Implantable Lead Location: 753858
Implantable Lead Location: 753859
Implantable Lead Location: 753860
Implantable Lead Model: 4598
Implantable Pulse Generator Implant Date: 20180614
Lead Channel Impedance Value: 193.707
Lead Channel Impedance Value: 211.891
Lead Channel Impedance Value: 218.104
Lead Channel Impedance Value: 237.686
Lead Channel Impedance Value: 265.661
Lead Channel Impedance Value: 342 Ohm
Lead Channel Impedance Value: 361 Ohm
Lead Channel Impedance Value: 418 Ohm
Lead Channel Impedance Value: 418 Ohm
Lead Channel Impedance Value: 513 Ohm
Lead Channel Impedance Value: 532 Ohm
Lead Channel Impedance Value: 551 Ohm
Lead Channel Impedance Value: 665 Ohm
Lead Channel Impedance Value: 665 Ohm
Lead Channel Impedance Value: 779 Ohm
Lead Channel Impedance Value: 836 Ohm
Lead Channel Impedance Value: 893 Ohm
Lead Channel Impedance Value: 950 Ohm
Lead Channel Pacing Threshold Amplitude: 0.5 V
Lead Channel Pacing Threshold Amplitude: 0.625 V
Lead Channel Pacing Threshold Pulse Width: 0.4 ms
Lead Channel Pacing Threshold Pulse Width: 1 ms
Lead Channel Sensing Intrinsic Amplitude: 0.625 mV
Lead Channel Sensing Intrinsic Amplitude: 12.625 mV
Lead Channel Sensing Intrinsic Amplitude: 12.625 mV
Lead Channel Setting Pacing Amplitude: 1 V
Lead Channel Setting Pacing Amplitude: 2 V
Lead Channel Setting Pacing Pulse Width: 0.4 ms
Lead Channel Setting Pacing Pulse Width: 1 ms
Lead Channel Setting Sensing Sensitivity: 0.3 mV
Zone Setting Status: 755011

## 2023-04-26 NOTE — Discharge Instructions (Signed)

## 2023-04-27 ENCOUNTER — Ambulatory Visit
Admission: RE | Admit: 2023-04-27 | Discharge: 2023-04-27 | Disposition: A | Discharge status: Home / Self Care | Payer: Medicare Other | Source: Ambulatory Visit | Attending: Neurosurgery | Admitting: Neurosurgery

## 2023-04-27 DIAGNOSIS — M48061 Spinal stenosis, lumbar region without neurogenic claudication: Secondary | ICD-10-CM | POA: Diagnosis not present

## 2023-04-27 DIAGNOSIS — M5416 Radiculopathy, lumbar region: Secondary | ICD-10-CM

## 2023-04-27 DIAGNOSIS — M5116 Intervertebral disc disorders with radiculopathy, lumbar region: Secondary | ICD-10-CM | POA: Diagnosis not present

## 2023-04-27 DIAGNOSIS — M4726 Other spondylosis with radiculopathy, lumbar region: Secondary | ICD-10-CM | POA: Diagnosis not present

## 2023-04-27 DIAGNOSIS — M4807 Spinal stenosis, lumbosacral region: Secondary | ICD-10-CM | POA: Diagnosis not present

## 2023-04-27 MED ORDER — DIAZEPAM 5 MG PO TABS: 5.0000 mg | ORAL_TABLET | Freq: Once | ORAL | Status: DC

## 2023-04-27 MED ORDER — ONDANSETRON HCL 4 MG/2ML IJ SOLN: 4.0000 mg | Freq: Once | INTRAMUSCULAR | Status: DC | PRN

## 2023-04-27 MED ORDER — MEPERIDINE HCL 50 MG/ML IJ SOLN: 50.0000 mg | Freq: Once | INTRAMUSCULAR | Status: DC | PRN

## 2023-04-27 MED ORDER — IOPAMIDOL (ISOVUE-M 200) INJECTION 41%
20.0000 mL | Freq: Once | INTRAMUSCULAR | Status: AC
  Administered 2023-04-27: 20 mL via INTRATHECAL

## 2023-05-01 DIAGNOSIS — H43812 Vitreous degeneration, left eye: Secondary | ICD-10-CM | POA: Diagnosis not present

## 2023-05-01 DIAGNOSIS — H43811 Vitreous degeneration, right eye: Secondary | ICD-10-CM | POA: Diagnosis not present

## 2023-05-01 DIAGNOSIS — H401131 Primary open-angle glaucoma, bilateral, mild stage: Secondary | ICD-10-CM | POA: Diagnosis not present

## 2023-05-01 DIAGNOSIS — H35372 Puckering of macula, left eye: Secondary | ICD-10-CM | POA: Diagnosis not present

## 2023-05-01 DIAGNOSIS — H353132 Nonexudative age-related macular degeneration, bilateral, intermediate dry stage: Secondary | ICD-10-CM | POA: Diagnosis not present

## 2023-05-08 DIAGNOSIS — M5416 Radiculopathy, lumbar region: Secondary | ICD-10-CM | POA: Diagnosis not present

## 2023-05-09 ENCOUNTER — Telehealth (HOSPITAL_BASED_OUTPATIENT_CLINIC_OR_DEPARTMENT_OTHER): Payer: Self-pay | Admitting: *Deleted

## 2023-05-09 NOTE — Telephone Encounter (Signed)
Patient with diagnosis of afib on Xarelto for anticoagulation.    Procedure: Bilateral L2-L3 TFESI  Date of procedure: TBD  CHA2DS2-VASc Score = 8  This indicates a 10.8% annual risk of stroke. The patient's score is based upon: CHF History: 1 HTN History: 1 Diabetes History: 1 Stroke History: 2 Vascular Disease History: 1 Age Score: 2 Gender Score: 0   Periprocedural TIA following cath in 02/2018   CrCl 66mL/min Platelet count 148K  Spinal procedure requires 3 day Xarelto hold. Will confirm with MD if this is acceptable given pt's elevated CHADS2VASc score and history of periprocedural TIA in 2019.  **This guidance is not considered finalized until pre-operative APP has relayed final recommendations.**

## 2023-05-09 NOTE — Telephone Encounter (Signed)
Pre-operative Risk Assessment    Patient Name: Mason [name]  DOB: [birth year] MRN: [id number]      Request for Surgical Clearance    Procedure:   Bilateral L2-L3 TFESI   Date of Surgery:  Clearance TBD                                 Surgeon:  Dr. Aileen Fass Surgeon's Group or Practice Name:  [facility] Phone number:  [phone] Fax number:  [phone]   Type of Clearance Requested:   - Medical  - Pharmacy:  Hold Rivaroxaban (Xarelto) 3 days prior and resume day after.   Type of Anesthesia:  Not Indicated   Additional requests/questions:    Signed, Emmit Pomfret   05/09/2023, 1:57 PM

## 2023-05-10 NOTE — Telephone Encounter (Signed)
Patient called to follow-up on the status of his clearance.  Patient stated can leave message on his voice mail.

## 2023-05-10 NOTE — Telephone Encounter (Signed)
Will forward to the pre op APP to review notes from Dr. Jens Som.

## 2023-05-11 ENCOUNTER — Encounter: Payer: Self-pay | Admitting: Cardiovascular Disease

## 2023-05-11 NOTE — Telephone Encounter (Signed)
Patient Name: Mason [name]  DOB: 07/05/1941 MRN: [id number]  Primary Cardiologist: Thurmon Fair, MD  Chart reviewed as part of pre-operative protocol coverage. Pre-op clearance already addressed by colleagues in earlier phone notes. To summarize recommendations:  -Ok for surgery; hold xarelto 3 days prior to surgery and resume after when ok with surgery [name]   Will route this bundled recommendation to requesting provider via Epic fax function and remove from pre-op pool. Please call with questions.  Sharlene Dory, PA-C 05/11/2023, 7:13 AM

## 2023-05-11 NOTE — Progress Notes (Signed)
Remote ICD transmission.   

## 2023-05-11 NOTE — Telephone Encounter (Signed)
Spoke to patient he stated Pain management never received clearance for steroid injection.Advised clearance was faxed to Dr.Dave [facility] on 10/16.Clearance advising ok to hold Xarelto 3 days prior to procedure refaxed to fax # [phone].

## 2023-05-18 DIAGNOSIS — M5416 Radiculopathy, lumbar region: Secondary | ICD-10-CM | POA: Diagnosis not present

## 2023-06-01 ENCOUNTER — Other Ambulatory Visit: Payer: Self-pay | Admitting: Cardiovascular Disease

## 2023-06-07 DIAGNOSIS — M5416 Radiculopathy, lumbar region: Secondary | ICD-10-CM | POA: Diagnosis not present

## 2023-06-07 DIAGNOSIS — Z6830 Body mass index (BMI) 30.0-30.9, adult: Secondary | ICD-10-CM | POA: Diagnosis not present

## 2023-06-08 ENCOUNTER — Other Ambulatory Visit: Payer: Self-pay | Admitting: Family Medicine

## 2023-06-11 ENCOUNTER — Encounter: Payer: Self-pay | Admitting: Internal Medicine

## 2023-06-11 ENCOUNTER — Ambulatory Visit: Payer: Medicare Other | Admitting: Internal Medicine

## 2023-06-11 ENCOUNTER — Ambulatory Visit (INDEPENDENT_AMBULATORY_CARE_PROVIDER_SITE_OTHER): Payer: Medicare Other | Admitting: Internal Medicine

## 2023-06-11 VITALS — BP 124/80 | HR 93 | Ht 71.0 in | Wt 223.0 lb

## 2023-06-11 DIAGNOSIS — Z7985 Long-term (current) use of injectable non-insulin antidiabetic drugs: Secondary | ICD-10-CM

## 2023-06-11 DIAGNOSIS — Z794 Long term (current) use of insulin: Secondary | ICD-10-CM | POA: Diagnosis not present

## 2023-06-11 DIAGNOSIS — E785 Hyperlipidemia, unspecified: Secondary | ICD-10-CM | POA: Diagnosis not present

## 2023-06-11 DIAGNOSIS — Z7984 Long term (current) use of oral hypoglycemic drugs: Secondary | ICD-10-CM

## 2023-06-11 DIAGNOSIS — E1159 Type 2 diabetes mellitus with other circulatory complications: Secondary | ICD-10-CM

## 2023-06-11 DIAGNOSIS — E1165 Type 2 diabetes mellitus with hyperglycemia: Secondary | ICD-10-CM | POA: Diagnosis not present

## 2023-06-11 LAB — POCT GLYCOSYLATED HEMOGLOBIN (HGB A1C): Hemoglobin A1C: 6.6 % — AB (ref 4.0–5.6)

## 2023-06-11 MED ORDER — LANTUS SOLOSTAR 100 UNIT/ML ~~LOC~~ SOPN
34.0000 [IU] | PEN_INJECTOR | Freq: Every day | SUBCUTANEOUS | 3 refills | Status: DC
Start: 2023-06-11 — End: 2023-07-04

## 2023-06-11 NOTE — Progress Notes (Signed)
Patient ID: [name], male   DOB: 19-Oct-1940, 82 y.o.   MRN: [id number]   HPI: Mason [name] is a 82 y.o.-year-old male, initially referred by his PCP, Dr. Milinda Cave, returning for follow-up for DM2, dx in ~2011, insulin-dependent since 2019, uncontrolled, with long-term complications (CAD with ?h/o AMI, nonischemic CMP, CHF, Afib, cerebrovascular disease with h/o TIA, CKD stage III, peripheral neuropathy, ED).  Last visit 4 months ago.    Interim history: No increased urination, nausea, chest pain.  He has back pain.    Reviewed HbA1c levels:   Lab Results  Component Value Date   HGBA1C 7.1 (A) 02/08/2023   HGBA1C 7.1 (A) 09/29/2022   HGBA1C 6.6 (A) 05/31/2022   HGBA1C 7.3 (A) 01/26/2022   HGBA1C 7.2 (A) 09/08/2021   HGBA1C 6.7 (A) 12/09/2020   HGBA1C 7.6 (A) 09/09/2020   HGBA1C 9.7 (H) 06/22/2020   HGBA1C 9.0 (H) 03/23/2020   HGBA1C 9.2 (H) 03/16/2020   HGBA1C 9.9 (H) 11/20/2019   HGBA1C 9.7 (H) 05/27/2019   HGBA1C 9.0 (H) 02/26/2019   HGBA1C 10.1 (H) 11/29/2018   HGBA1C 9.0 (A) 08/29/2018   HGBA1C 9.0 08/29/2018   HGBA1C 9.0 (A) 08/29/2018   HGBA1C 9.0 (A) 08/29/2018   HGBA1C 7.8 (A) 05/14/2018   HGBA1C 8.1 (H) 02/06/2018   HGBA1C 8.0 (H) 10/26/2017   HGBA1C 8.2 (H) 07/27/2017   HGBA1C 7.0 03/01/2017   HGBA1C 7.1 11/16/2016   HGBA1C 7.8 (H) 07/03/2016   HGBA1C 6.8 03/14/2016   HGBA1C 7.0 (H) 12/09/2015   HGBA1C 7.4 (H) 09/20/2015   HGBA1C 7.2 (H) 03/25/2015   HGBA1C 7.5 (H) 10/15/2014  04/11/2021: HbA1c 7.1%  He was previously on: - Jardiance 10 mg before breakfast - Trulicity 1.5 >> 3  mg weekly - NovoLog pens :  14-18 >> 18-20 >> 15-16 >> 15 >> 10 >> but taking 12 units before brunch 8-10 units before dinner >> 0 units >> 7-8 >> but taking 10 units before a larger dinner  If you have a snack at night, you may need ~5 units before the snack - Lantus vial 44 >> 40 >> 36-40 >> 42 >> 45 >> 40 >> but taking 42 units at bedtime She was on metformin in the  past.  Now on: - Jardiance 10 mg before breakfast - Ozempic 1 mg  - Lantus 36 >> 42 >> 38 units at bedtime - NovoLog  10 >> 12 >> 10-12 >> 8-10 (still taking 12) units before brunch 6-10 >> 6-8 before dinner >> still taking 10-12 units after dinner If you have a snack at night, you may need ~5 units before the snack He was previously on Trulicity.  Pt checks his sugars more than 4 times a day with his freestyle libre CGM:  Previous:  Previously:   Lowest sugar was: 44 >> 44 >> 48 >> 45; he has hypoglycemia awareness at 70.  Highest sugar was 400 >> ... 200s >> 309 >> 235 >> 200s.  Glucometer: Freestyle  Pt's meals are: - Breakfast: cereals, eggs, grits, toast - Lunch: may skip or sandwich - Dinner: meat + veggies + starch - Snacks: apple/cheese >> bananasplits at night Patient saw nutrition a long time ago.  -+ Stage III CKD, last BUN/creatinine:  Lab Results  Component Value Date   BUN 26 (H) 04/06/2023   BUN 22 12/04/2022   CREATININE 1.67 (H) 04/06/2023   CREATININE 1.52 (H) 12/04/2022    Lab Results  Component Value Date   MICRALBCREAT  0.9 12/04/2022   MICRALBCREAT 1.0 11/20/2019   MICRALBCREAT 1.4 02/06/2018   MICRALBCREAT 0.7 11/16/2016   MICRALBCREAT 0.5 10/15/2014   MICRALBCREAT 0.1 10/14/2013  On Entresto.  -+ HL; last set of lipids: Lab Results  Component Value Date   CHOL 116 04/06/2023   HDL 42.20 04/06/2023   LDLCALC 45 04/06/2023   LDLDIRECT 103.0 06/22/2020   TRIG 145.0 04/06/2023   CHOLHDL 3 04/06/2023  04/11/2021: 141/214/45/53 Previously on Zocor 20, but changed to Crestor 20 mg daily.  - last eye exam was 01/11/2023:+ DR. He also has glaucoma.  - + numbness and tingling in his feet.  On Neurontin 300 mg twice a day.   Seeing podiatry - Dr. Yetta Barre at the [facility] clinic.  Latest foot exam 03/20/2023.  No known FH of DM.  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  Other labs:    Drinks beer 2-3x a day, but not quite every day.  ROS: + see HPI  I reviewed pt's medications, allergies, PMH, social hx, family hx, and changes were documented in the history of present illness. Otherwise, unchanged from my initial visit note.  Past Medical History:  Diagnosis Date   AICD (automatic cardioverter/defibrillator) present 2018   MDT CRT-D.  Fatigue-->completely pacer dependent.  Pacer settings adjusted 12/2017 to allow more chronotrophc variance with ADL's//exertion.   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   02/2016 EF 40%, diffuse hypokinesis, grade 2 DD.  Myoc perf imaging showed EF 32% 04/2016.  Pt upgraded to CRT-D 01/04/17.   Chronic renal insufficiency, stage III (moderate) (HCC) 2015   CrCl about 60 ml/min   Complete heart block (HCC)    Has dual chamber pacer.   COVID-19 virus infection 01/05/2021   paxlovid   Depression    DOE (dyspnea on exertion)    NYHA class II/III CHF   Dyspnea 2021   with exertion, bending over   Episodic low back pain 01/22/2013   w/intermittent radiculitis (12/2014 his neurologist referred him to pain mgmt for epidural steroid injection)   Erectile dysfunction 2019   due to zoloft--urol rx'd viagra   GERD (gastroesophageal reflux disease)    H/O tilt table evaluation 11/02/2005   negative   Helicobacter pylori gastritis 01/2016   History of adenomatous polyp of colon 10/12/2011   Dr. Elnoria Howard (3 right side of colon- tubular adenomas removed)   History of cardiovascular stress test 05/28/2012   no ischemia, EF 37%, imaging results are unchanged and within normal variance   History of chronic prostatitis    History  of kidney stones    History of vertigo    + Hx of posterior HA's.  Neuro (Dr. Sandria Manly) eval 2011.  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   Hyperlipidemia    Hypertension    Lumbar spondylosis    lumbosacral radiculopathy at L4 by EMG testing, right foot drop (neurologist is Dr. Luberta Robertson with [facility] in [place])--neurologist referred him to neurosurgery   Migraine    "used to have them all the time; none for years" (01/04/2017)   Myocardial infarction (  HCC) ?1970s   not entirely certain of this   Nephrolithiasis 07/2012   Left UVJ 2 mm stone with dilation of renal collecting system and slight hydroureter on right   Neuropathy    NICM (nonischemic cardiomyopathy) (HCC)    a. 02/2018 Cath: LM nl, LAD min irregs, LCX no, RCA 20d. EUMP53. Fick CO/CI 4.4/2.0.   Osteoarthritis, multiple sites    Shoulders, back, knees   Pacemaker 02/05/2012   dual chamber, complete heart block, meddtronic revo, lasted checked 12/2015.  Since no CAD on cath 05/2016, cards recommends upgrade to CRT-D.   Permanent atrial fibrillation (HCC)    DCCV 07/09/13-converted, lasted two days, then back into afib--needs lifetime anticoagulation (Xarelto as of 09/2014)   Prostate cancer screening 09/2017   done by urol annually (normal prostate exam documented + PSA 0.84 as of 10/01/17 urol f/u.  10/2018 urol f/u PSA 0.6, no prostate nodule.   Rectus diastasis    Right ankle sprain 08/2017   w/distal fibula avulsion fx noted on u/s but not plain film-(Dr. Hudnall).   Skin cancer of arm, left    "burned it off" (01/04/2017)   TIA (transient ischemic attack)    L face and L arm weakness. Peri procedural->a. 03/22/2018 following cath. CT head neg. No MRI b/c has pacer. Likely due to embolus to distal branch of RMCA   Type II diabetes mellitus (HCC)    Past Surgical History:  Procedure Laterality Date   ABI's Bilateral 05/21/2018   normal    BACK SURGERY     BIV ICD INSERTION CRT-D N/A 01/04/2017   Procedure: BiV ICD ;  Surgeon: Regan Lemming, MD;  Location: [facility] INVASIVE CV LAB;  Service: Cardiovascular;  Laterality: N/A;   CARDIAC CATHETERIZATION N/A 06/14/2016   Minimal nonobstructive dz, EF 25-35%.  Procedure: Left Heart Cath and Coronary Angiography;  Surgeon: Peter M Swaziland, MD;  Location: [facility] INVASIVE CV LAB;  Service: Cardiovascular;  Laterality: N/A;   CARDIOVASCULAR STRESS TEST  2012   2012 nuclear perfusion study: low risk scan; 04/2016 normal myocardial perfusion imaging, EF 32%.   CARDIOVERSION  07/09/2012   Procedure: CARDIOVERSION;  Surgeon: Thurmon Fair, MD;  Location: [facility] ENDOSCOPY;  Service: Cardiovascular;  Laterality: N/A;   CATARACT EXTRACTION W/ INTRAOCULAR LENS IMPLANT & ANTERIOR VITRECTOMY, BILATERAL Bilateral    CIRCUMCISION N/A 03/23/2020   Procedure: CIRCUMCISION ADULT;  Surgeon: Belva Agee, MD;  Location: [facility] ORS;  Service: Urology;  Laterality: N/A;   COLONOSCOPY W/ POLYPECTOMY  approx 2006; repeated 09/2011   Polyps on 2013 EGD as well, repeat 12/2014   COLONOSCOPY WITH PROPOFOL N/A 07/08/2021   adenoma x 1. Procedure: COLONOSCOPY WITH PROPOFOL;  Surgeon: Jeani Hawking, MD;  Location: [facility] ENDOSCOPY;  Service: Endoscopy;  Laterality: N/A;   ESOPHAGOGASTRODUODENOSCOPY  10/18/2006   Done due to chronic GERD: Normal, bx showed no barrett's esophagus (Dr. Elnoria Howard)   EYE SURGERY  01/08/2023   FLEXOR TENDON REPAIR Left 10/02/2016   Procedure: LEFT RING FINGER WOUND EXPORATION AND FLEXOR TENDON REPAIR AND NERVE REPAIR;  Surgeon: Mack Hook, MD;  Location: [facility] OR;  Service: Orthopedics;  Laterality: Left;   INSERT / REPLACE / REMOVE PACEMAKER  02/05/2012   dual chamber, sinus node dysfunction, sinus arrest, PAF, Medtronic Revo serial#-[id number] H: last checked 05/2015   LUMBAR LAMINECTOMY Left 1976   L4-5   PACEMAKER REMOVAL  01/04/2017   PERMANENT PACEMAKER INSERTION N/A 02/05/2012   Procedure:  PERMANENT PACEMAKER INSERTION;  Surgeon: Thurmon Fair, MD; Generator Medtronic Revo model  [id number] serial number [id number] H Laterality: N/A;   POLYPECTOMY  07/08/2021   Procedure: POLYPECTOMY;  Surgeon: Jeani Hawking, MD;  Location: [facility] ENDOSCOPY;  Service: Endoscopy;;   RETINAL DETACHMENT SURGERY Left ~ 1999   REVERSE SHOULDER ARTHROPLASTY Left 2018   Left shoulder reverse [facility] in [place]   RIGHT/LEFT HEART CATH AND CORONARY ANGIOGRAPHY N/A 03/22/2018   EF 30-35%, no CAD.  Procedure: RIGHT/LEFT HEART CATH AND CORONARY ANGIOGRAPHY;  Surgeon: Dolores Patty, MD;  Location: [facility] INVASIVE CV LAB;  Service: Cardiovascular;  Laterality: N/A;   TRANSTHORACIC ECHOCARDIOGRAM  08/25/10; 05/2012; 03/23/16;12/2017   mild asymmetric LVH, normal systolic function, normal diastolic fxn, mild-to-mod mitral regurg, mild aortic valve sclerosis and trace AI, mild aortic root dilatation. 2014 f/u showed EF 40-45%, mod LAE, A FIB.  02/2016 EF 40%, diffuse hypokinesis, grade 2 DD. 12/2017 EF 35-40%,diffuse hypokin,grd III DD, mild MR   WISDOM TOOTH EXTRACTION  06/27/2021   EF 50-55%, mild aortic root/ascending aorta dilatation (41-42 mm).   Social History   Socioeconomic History   Marital status: Married    Spouse name: Not on file   Number of children: Not on file   Years of education: Not on file   Highest education level: 12th grade  Occupational History   Not on file  Tobacco Use   Smoking status: Never   Smokeless tobacco: Never  Vaping Use   Vaping status: Never Used  Substance and Sexual Activity   Alcohol use: Yes    Comment: occ   Drug use: No   Sexual activity: Not on file  Other Topics Concern   Not on file  Social History Narrative   Not on file   Social Determinants of Health   Financial Resource Strain: Low Risk  (09/10/2022)   Overall Financial Resource Strain (CARDIA)    Difficulty of Paying Living Expenses: Not hard at all  Food Insecurity: No Food Insecurity  (09/10/2022)   Hunger Vital Sign    Worried About Running Out of Food in the Last Year: Never true    Ran Out of Food in the Last Year: Never true  Transportation Needs: No Transportation Needs (09/10/2022)   PRAPARE - Administrator, Civil Service (Medical): No    Lack of Transportation (Non-Medical): No  Physical Activity: Inactive (09/10/2022)   Exercise Vital Sign    Days of Exercise per Week: 0 days    Minutes of Exercise per Session: 0 min  Stress: Stress Concern Present (09/10/2022)   [employer] of Occupational Health - Occupational Stress Questionnaire    Feeling of Stress : To some extent  Social Connections: Socially Integrated (09/10/2022)   Social Connection and Isolation Panel [NHANES]    Frequency of Communication with Friends and Family: More than three times a week    Frequency of Social Gatherings with Friends and Family: More than three times a week    Attends Religious Services: More than 4 times per year    Active Member of [employer] or Organizations: Yes    Attends Engineer, structural: More than 4 times per year    Marital Status: Married  Catering manager Violence: Not At Risk (09/01/2022)   Humiliation, Afraid, Rape, and Kick questionnaire    Fear of Current or Ex-Partner: No    Emotionally Abused: No    Physically Abused: No    Sexually Abused: No   Current Outpatient Medications on File Prior to Visit  Medication Sig Dispense Refill  BD INSULIN SYRINGE U/F 31G X 5/16" 1 ML MISC USE DAILY AS DIRECTED 100 each 3   COMBIGAN 0.2-0.5 % ophthalmic solution Place 1 drop into both eyes at bedtime.      Continuous Blood Gluc Receiver (FREESTYLE LIBRE 2 READER) DEVI 1 each by Does not apply route daily. 1 each 3   Continuous Blood Gluc Sensor (FREESTYLE LIBRE 2 SENSOR) MISC 1 each by Does not apply route every 14 (fourteen) days. 6 each 3   diclofenac Sodium (VOLTAREN) 1 % GEL Apply 4 g topically 4 (four) times daily. 100 g 1   empagliflozin  (JARDIANCE) 10 MG TABS tablet Take 1 tablet (10 mg total) by mouth daily. 10 tablet 0   ENTRESTO 97-103 MG TAKE 1 TABLET TWO TIMES A DAY 180 tablet 3   eplerenone (INSPRA) 25 MG tablet TAKE 1 TABLET DAILY 90 tablet 3   gabapentin (NEURONTIN) 300 MG capsule Take 2 capsules (600 mg total) by mouth 2 (two) times daily. 360 capsule 1   insulin aspart (NOVOLOG FLEXPEN) 100 UNIT/ML FlexPen Inject 10 units before each meal 30 mL 3   ketoconazole (NIZORAL) 2 % cream Apply 1 Application topically daily.     ketorolac (ACULAR) 0.5 % ophthalmic solution      LANTUS 100 UNIT/ML injection INJECT 40 TO 45 UNITS TOTAL UNDER THE SKIN DAILY (Patient taking differently: 42 Units daily.) 40 mL 3   lidocaine (LIDODERM) 5 % PLACE 1 PATCH ON THE SKIN DAILY. REMOVE AND DISCARD PATCH WITHIN 12 HOURS OR AS DIRECTED BY DOCTOR 90 patch 1   liraglutide (VICTOZA) 18 MG/3ML SOPN Inject 1.8 mg into the skin daily. 27 mL 2   meclizine (ANTIVERT) 25 MG tablet TAKE 1 TABLET THREE TIMES A DAY AS NEEDED FOR DIZZINESS 270 tablet 0   metoprolol succinate (TOPROL-XL) 50 MG 24 hr tablet TAKE 1 TABLET DAILY. TAKE WITH OR IMMEDIATELY FOLLLOWING A MEAL 90 tablet 0   naloxone (NARCAN) nasal spray 4 mg/0.1 mL 1 spray into nostril as needed for opioid overdose.  Repeat in 3 min if no improvement 2 each 1   omeprazole (PRILOSEC) 40 MG capsule 1 cap po bid 180 capsule 1   oxybutynin (DITROPAN-XL) 10 MG 24 hr tablet TAKE 1 TABLET AT BEDTIME 90 tablet 3   oxyCODONE-acetaminophen (PERCOCET) 10-325 MG tablet 1-2 tabs po tid prn pain 90 tablet 0   rivaroxaban (XARELTO) 20 MG TABS tablet TAKE 1 TABLET DAILY WITH SUPPER 90 tablet 1   ROCKLATAN 0.02-0.005 % SOLN Apply to eye.     rOPINIRole (REQUIP) 1 MG tablet TAKE 1 TABLET DAILY 90 tablet 1   rosuvastatin (CRESTOR) 20 MG tablet TAKE 1 TABLET DAILY 90 tablet 3   Semaglutide, 1 MG/DOSE, 4 MG/3ML SOPN Inject 1 mg as directed once a week. 9 mL 3   sertraline (ZOLOFT) 100 MG tablet TAKE 1 TABLET DAILY  90 tablet 1   SURE COMFORT PEN NEEDLES 31G X 5 MM MISC USE TO INJECT INSULIN UNDER THE SKIN 100 each 11   No current facility-administered medications on file prior to visit.   No Known Allergies Family History  Problem Relation Age of Onset   Heart failure Mother    Stroke Mother    Stroke Father    Heart disease Sister    Heart disease Sister    Cancer Sister        liver   Cancer Brother        lung   Cancer Brother  lung   Heart disease Brother    PE: BP 124/80   Pulse 93   Ht 5\' 11"  (1.803 m)   Wt 223 lb (101.2 kg)   SpO2 96%   BMI 31.10 kg/m  Wt Readings from Last 10 Encounters:  06/11/23 223 lb (101.2 kg)  04/12/23 230 lb (104.3 kg)  04/06/23 221 lb 12.8 oz (100.6 kg)  03/28/23 227 lb (103 kg)  03/20/23 226 lb 9.6 oz (102.8 kg)  02/08/23 230 lb 9.6 oz (104.6 kg)  01/15/23 229 lb 9.6 oz (104.1 kg)  12/27/22 229 lb (103.9 kg)  12/04/22 233 lb 12.8 oz (106.1 kg)  09/29/22 229 lb (103.9 kg)   Constitutional: overweight, in NAD Eyes: EOMI, no exophthalmos ENT: no thyromegaly, no cervical lymphadenopathy Cardiovascular: RRR, No MRG Respiratory: CTA B Musculoskeletal: no deformities Skin: no rashes Neurological: + tremor with outstretched hands  ASSESSMENT: 1. DM2, insulin-dependent, uncontrolled, with complications: - CAD with ?h/o AMI - nonischemic CMP - CHF, s/p AICD - Afib, s/p pacemaker - cerebrovascular disease with h/o TIA - CKD stage III - peripheral neuropathy - ED  No family history of medullary thyroid cancer or personal history of pancreatitis.  2. HL  PLAN:  1. Patient with longstanding, uncontrolled type 2 diabetes, on SGLT2 inhibitor and basal/bolus insulin along with weekly GLP-1 receptor agonist, with fair control.  At last visit, HbA1c was 7.1% but he had another HbA1c obtained later that month which was 7.5%.  At last visit, sugars are much improved after he was able to start back on Trulicity, with the vast majority of the  blood sugars fluctuating within the target range.  He still had occasional higher blood sugars at night and some lows around 5 PM.  Upon questioning, he was forgetting to take his mealtime insulin before brunch and sometimes was taking it later, prompting hypoglycemia late after the meal.  We discussed about the need to take NovoLog 15 minutes before a meal.  At last visit, he just started Ozempic, switching from Trulicity so I did advise him to decrease the NovoLog doses to avoid further hypoglycemia.  We also discussed about staying well-hydrated while on Jardiance.  We continued the rest of the regimen. CGM interpretation: -At today's visit, we reviewed his CGM downloads: It appears that 86% of values are in target range (goal >70%), while 3% are higher than 180 (goal <25%), and 11% are lower than 70 (goal <4%).  The calculated average blood sugar is 114.  The projected HbA1c for the next 3 months (GMI) is 6.0%. -Reviewing the CGM trends, sugars are lower than before, mostly fluctuating within the target range but frequently dropping under 70s especially in the morning until after lunch and occasionally after dinner.  Upon questioning, he forgot to decrease the doses of NovoLog as recommended at last visit.  Also, he is taking the dose of NovoLog at night sometimes after dinner, 10 to 12 units, as he forgot he needed to reduce the dose to 6-8 units.  I advised him to back off all of the insulin doses now to prevent further lows.  We also discussed that if he forgets to take the insulin before the meal and has to take it afterwards, and not take more than 5 units.  Otherwise, we can continue the current regimen. - I suggested to:  Patient Instructions  Please continue: - Jardiance 10 mg before breakfast - Ozempic 1 mg weekly  Please decrease: - Lantus 34 units at bedtime - NovoLog  15 min before meals: 8-10 units before brunch 6-8 units before dinner If you have to take the suppertime insulin after  the meal, take only up to 5 units.  Please return in 4 months.   - we checked his HbA1c: 6.6% (lower) - advised to check sugars at different times of the day - 4x a day, rotating check times - advised for yearly eye exams >> he is UTD - At last visits we discussed about possibly trying alpha-lipoic acid for numbness in his feet -he did not try this yet.  He was already on Neurontin and ropinirole.  He has twice a year foot exam at the [place].  I advised him to have these records sent to me. - return to clinic in 4 months  2. HL -Latest lipid panel from 01/2023 was reviewed: LDL at goal, HDL low -He continues Crestor 20 mg daily without side effects  Carlus Pavlov, MD PhD [facility]ocr[facility]

## 2023-06-11 NOTE — Patient Instructions (Addendum)
Please continue: - Jardiance 10 mg before breakfast - Ozempic 1 mg weekly  Please decrease: - Lantus 34 units at bedtime - NovoLog 15 min before meals: 8-10 units before brunch 6-8 units before dinner If you have to take the suppertime insulin after the meal, take only up to 5 units.  Please return in 4 months.

## 2023-06-12 NOTE — Addendum Note (Signed)
Addended by: Pollie Meyer on: 06/12/2023 07:55 AM   Modules accepted: Orders

## 2023-06-13 DIAGNOSIS — R102 Pelvic and perineal pain: Secondary | ICD-10-CM | POA: Diagnosis not present

## 2023-06-18 DIAGNOSIS — M5416 Radiculopathy, lumbar region: Secondary | ICD-10-CM | POA: Diagnosis not present

## 2023-06-28 ENCOUNTER — Encounter: Payer: Medicare Other | Admitting: Cardiovascular Disease

## 2023-06-29 ENCOUNTER — Other Ambulatory Visit: Payer: Self-pay | Admitting: Internal Medicine

## 2023-06-29 ENCOUNTER — Other Ambulatory Visit: Payer: Self-pay | Admitting: Family Medicine

## 2023-06-29 DIAGNOSIS — E1165 Type 2 diabetes mellitus with hyperglycemia: Secondary | ICD-10-CM

## 2023-07-02 ENCOUNTER — Other Ambulatory Visit: Payer: Self-pay | Admitting: Family Medicine

## 2023-07-02 NOTE — Telephone Encounter (Signed)
Refill requested for Gabapentin sent [facility].  Last OV 04/06/23

## 2023-07-04 ENCOUNTER — Telehealth: Payer: Self-pay

## 2023-07-04 MED ORDER — LANTUS SOLOSTAR 100 UNIT/ML ~~LOC~~ SOPN: 34.0000 [IU] | PEN_INJECTOR | Freq: Every day | SUBCUTANEOUS | 3 refills | Status: DC

## 2023-07-04 NOTE — Telephone Encounter (Signed)
Requested Prescriptions   Signed Prescriptions Disp Refills   insulin glargine (LANTUS SOLOSTAR) 100 UNIT/ML Solostar Pen 30 mL 3    Sig: Inject 34-36 Units into the skin daily.    Authorizing Provider: Carlus Pavlov    Ordering User: Pollie Meyer

## 2023-07-05 DIAGNOSIS — H401123 Primary open-angle glaucoma, left eye, severe stage: Secondary | ICD-10-CM | POA: Diagnosis not present

## 2023-07-05 DIAGNOSIS — E119 Type 2 diabetes mellitus without complications: Secondary | ICD-10-CM | POA: Diagnosis not present

## 2023-07-05 DIAGNOSIS — Z961 Presence of intraocular lens: Secondary | ICD-10-CM | POA: Diagnosis not present

## 2023-07-05 DIAGNOSIS — H35372 Puckering of macula, left eye: Secondary | ICD-10-CM | POA: Diagnosis not present

## 2023-07-05 DIAGNOSIS — Z9889 Other specified postprocedural states: Secondary | ICD-10-CM | POA: Diagnosis not present

## 2023-07-26 ENCOUNTER — Ambulatory Visit: Payer: Medicare Other

## 2023-07-26 DIAGNOSIS — I442 Atrioventricular block, complete: Secondary | ICD-10-CM | POA: Diagnosis not present

## 2023-07-31 ENCOUNTER — Other Ambulatory Visit: Payer: Self-pay | Admitting: Family Medicine

## 2023-08-01 ENCOUNTER — Other Ambulatory Visit: Payer: Self-pay | Admitting: Family Medicine

## 2023-08-01 DIAGNOSIS — M5414 Radiculopathy, thoracic region: Secondary | ICD-10-CM

## 2023-08-02 DIAGNOSIS — N5082 Scrotal pain: Secondary | ICD-10-CM | POA: Diagnosis not present

## 2023-08-02 DIAGNOSIS — N401 Enlarged prostate with lower urinary tract symptoms: Secondary | ICD-10-CM | POA: Diagnosis not present

## 2023-08-02 DIAGNOSIS — R3914 Feeling of incomplete bladder emptying: Secondary | ICD-10-CM | POA: Diagnosis not present

## 2023-08-07 DIAGNOSIS — M5416 Radiculopathy, lumbar region: Secondary | ICD-10-CM | POA: Diagnosis not present

## 2023-08-08 ENCOUNTER — Emergency Department (HOSPITAL_COMMUNITY): Payer: Medicare Other

## 2023-08-08 ENCOUNTER — Emergency Department (HOSPITAL_COMMUNITY)
Admission: EM | Admit: 2023-08-08 | Discharge: 2023-08-08 | Disposition: A | Discharge status: Home / Self Care | Payer: Medicare Other | Attending: Emergency Medicine | Admitting: Emergency Medicine

## 2023-08-08 ENCOUNTER — Encounter (HOSPITAL_COMMUNITY): Payer: Self-pay

## 2023-08-08 ENCOUNTER — Other Ambulatory Visit: Payer: Self-pay

## 2023-08-08 DIAGNOSIS — R000 Tachycardia, unspecified: Secondary | ICD-10-CM | POA: Diagnosis not present

## 2023-08-08 DIAGNOSIS — E119 Type 2 diabetes mellitus without complications: Secondary | ICD-10-CM | POA: Insufficient documentation

## 2023-08-08 DIAGNOSIS — I6521 Occlusion and stenosis of right carotid artery: Secondary | ICD-10-CM | POA: Diagnosis not present

## 2023-08-08 DIAGNOSIS — I6502 Occlusion and stenosis of left vertebral artery: Secondary | ICD-10-CM | POA: Diagnosis not present

## 2023-08-08 DIAGNOSIS — Z950 Presence of cardiac pacemaker: Secondary | ICD-10-CM | POA: Diagnosis not present

## 2023-08-08 DIAGNOSIS — R509 Fever, unspecified: Secondary | ICD-10-CM | POA: Diagnosis not present

## 2023-08-08 DIAGNOSIS — R42 Dizziness and giddiness: Secondary | ICD-10-CM | POA: Diagnosis not present

## 2023-08-08 DIAGNOSIS — M542 Cervicalgia: Secondary | ICD-10-CM | POA: Insufficient documentation

## 2023-08-08 DIAGNOSIS — Z794 Long term (current) use of insulin: Secondary | ICD-10-CM | POA: Diagnosis not present

## 2023-08-08 DIAGNOSIS — R29818 Other symptoms and signs involving the nervous system: Secondary | ICD-10-CM | POA: Diagnosis not present

## 2023-08-08 DIAGNOSIS — Z96612 Presence of left artificial shoulder joint: Secondary | ICD-10-CM | POA: Diagnosis not present

## 2023-08-08 DIAGNOSIS — I10 Essential (primary) hypertension: Secondary | ICD-10-CM | POA: Diagnosis not present

## 2023-08-08 DIAGNOSIS — R0902 Hypoxemia: Secondary | ICD-10-CM | POA: Diagnosis not present

## 2023-08-08 LAB — TROPONIN I (HIGH SENSITIVITY)
Troponin I (High Sensitivity): 10 ng/L (ref <18)
Troponin I (High Sensitivity): 11 ng/L (ref <18)

## 2023-08-08 LAB — CBC
HCT: 46.6 % (ref 39.0–52.0)
Hemoglobin: 15.4 g/dL (ref 13.0–17.0)
MCH: 30.4 pg (ref 26.0–34.0)
MCHC: 33 g/dL (ref 30.0–36.0)
MCV: 92.1 fL (ref 80.0–100.0)
Platelets: 123 10*3/uL — ABNORMAL LOW (ref 150–400)
RBC: 5.06 MIL/uL (ref 4.22–5.81)
RDW: 12.8 % (ref 11.5–15.5)
WBC: 6.7 10*3/uL (ref 4.0–10.5)
nRBC: 0 % (ref 0.0–0.2)

## 2023-08-08 LAB — BASIC METABOLIC PANEL
Anion gap: 7 (ref 5–15)
BUN: 17 mg/dL (ref 8–23)
CO2: 22 mmol/L (ref 22–32)
Calcium: 7.1 mg/dL — ABNORMAL LOW (ref 8.9–10.3)
Chloride: 109 mmol/L (ref 98–111)
Creatinine, Ser: 1.1 mg/dL (ref 0.61–1.24)
GFR, Estimated: >60 mL/min (ref >60)
Glucose, Bld: 121 mg/dL — ABNORMAL HIGH (ref 70–99)
Potassium: 3.6 mmol/L (ref 3.5–5.1)
Sodium: 138 mmol/L (ref 135–145)

## 2023-08-08 LAB — I-STAT CHEM 8, ED
BUN: 32 mg/dL — ABNORMAL HIGH (ref 8–23)
Calcium, Ion: 1.03 mmol/L — ABNORMAL LOW (ref 1.15–1.40)
Chloride: 102 mmol/L (ref 98–111)
Creatinine, Ser: 1.4 mg/dL — ABNORMAL HIGH (ref 0.61–1.24)
Glucose, Bld: 157 mg/dL — ABNORMAL HIGH (ref 70–99)
HCT: 53 % — ABNORMAL HIGH (ref 39.0–52.0)
Hemoglobin: 18 g/dL — ABNORMAL HIGH (ref 13.0–17.0)
Potassium: 6 mmol/L — ABNORMAL HIGH (ref 3.5–5.1)
Sodium: 136 mmol/L (ref 135–145)
TCO2: 29 mmol/L (ref 22–32)

## 2023-08-08 LAB — CBG MONITORING, ED: Glucose-Capillary: 122 mg/dL — ABNORMAL HIGH (ref 70–99)

## 2023-08-08 MED ORDER — LIDOCAINE 5 % EX PTCH
1.0000 | MEDICATED_PATCH | CUTANEOUS | Status: DC
  Administered 2023-08-08: 1 via TRANSDERMAL
  Filled 2023-08-08: qty 1

## 2023-08-08 MED ORDER — IOHEXOL 350 MG/ML SOLN
75.0000 mL | Freq: Once | INTRAVENOUS | Status: AC | PRN
  Administered 2023-08-08: 75 mL via INTRAVENOUS

## 2023-08-08 MED ORDER — ACETAMINOPHEN 325 MG PO TABS
975.0000 mg | ORAL_TABLET | Freq: Once | ORAL | Status: AC
  Administered 2023-08-08: 975 mg via ORAL
  Filled 2023-08-08: qty 3

## 2023-08-08 MED ORDER — CYCLOBENZAPRINE HCL 10 MG PO TABS
5.0000 mg | ORAL_TABLET | Freq: Once | ORAL | Status: AC
  Administered 2023-08-08: 5 mg via ORAL
  Filled 2023-08-08: qty 1

## 2023-08-08 MED ORDER — CYCLOBENZAPRINE HCL 5 MG PO TABS: 5.0000 mg | ORAL_TABLET | Freq: Two times a day (BID) | ORAL | 0 refills | Status: DC | PRN

## 2023-08-08 NOTE — ED Provider Triage Note (Signed)
 Emergency Medicine Provider Triage Evaluation Note  Mason [name] , a 83 y.o. male  was evaluated in triage.  Pt complains of neck pain.  Severe right-sided neck pain began around 0100 this morning awoke him from sleep persisted since the onset.  Also reports some dizziness generalized weakness.  No headaches, change in vision, focal deficits, nausea, vomiting chest pain or shortness of breath.  No fevers chills or recent illness.  Review of Systems  Positive: As above Negative: As above  Physical Exam  BP (!) 131/101 (BP Location: Left Arm)   Pulse 75   Temp 98.1 F (36.7 C) (Oral)   Resp 19   SpO2 100%  Gen:   Awake, alert.  Mild distress secondary to neck pain Resp:  Normal effort Cardiac:  Regular rate rhythm.  Left anterior chest wall AICD in place MSK:   5 out of 5 motor strength all 4 extremities.  Sensation tact light touch throughout.  Reproducible right-sided neck pain with deep palpation over right lateral neck.  Limited active range of motion with rotation to the right.  No meningismus Other:  Diminished radial pulse on the right compared to the left  Medical Decision Making  Medically screening exam initiated at 8:57 AM.  Appropriate orders placed.  Barbera Books was informed that the remainder of the evaluation will be completed by another provider, this initial triage assessment does not replace that evaluation, and the importance of remaining in the ED until their evaluation is complete.  83 year old male with extensive medical history including atrial fibrillation on Xarelto , hypertension, type 2 diabetes heart failure and nonischemic cardiomyopathy presenting for atraumatic right-sided neck pain.  Pain is reproducible with deep palpation favoring musculoskeletal etiology although there is concern for his extensive cardiovascular history and potential vascular occlusion.  Right-sided radial pulse does seem to be weaker than the left-sided radial pulse.  Will obtain laboratory  workup including CTA of the head and neck to evaluate for occlusive etiology.  No focal neurodeficits on my exam that would be concerning for acute stroke.  Will provide analgesia while he awaits his ED room.  Remainder of care per ED team      Sallyanne Creamer, DO 08/08/23 1610

## 2023-08-08 NOTE — Discharge Instructions (Addendum)
 You were seen today for neck pain.  Your CT scan showed narrowing of your left vertebral artery and right internal carotid artery.  Your lab work was reassuring.  Your symptoms are likely due to a muscular strain.  If you develop severe pain, severe headache, fever, chest pain, weakness, or vision changes you should return to the ED.  You should follow up closely with your primary care doctor.

## 2023-08-08 NOTE — ED Provider Notes (Addendum)
 [place] [facility] Provider Note   CSN: [id number] Arrival date & time: 08/08/23  7829     History  Chief Complaint  Patient presents with   Dizziness   Neck Pain    Mason [name] is a 83 y.o. male.   Dizziness Neck Pain 83 year old male history of aortic aneurysm, complete heart block status post pacemaker placement, GERD, hypertension, hyperlipidemia, vertigo, nonischemic cardiomyopathy, diabetes presenting for neck pain.  Patient states he woke up around 1 AM with right-sided neck pain.  It is right-sided, worse with palpation and movement.  He does not remember any specific event to cause it.  It does not radiate.  He has no pain to the back of his neck.  No headache or weakness or numbness or vision changes.  He did report some dizziness earlier which has since resolved.  No vertigo-like sensation.  Has not had any fevers or chills.  No vomiting.  No chest pain or shortness of breath.  He has not had pain like this before.  He does not have any headache.     Home Medications Prior to Admission medications   Medication Sig Start Date End Date Taking? Authorizing Provider  cyclobenzaprine  (FLEXERIL ) 5 MG tablet Take 1 tablet (5 mg total) by mouth 2 (two) times daily as needed for muscle spasms. 08/08/23  Yes Coleman Daughters, MD  BD INSULIN  SYRINGE U/F 31G X 5/16" 1 ML MISC USE DAILY AS DIRECTED 01/12/23   McGowen, Minetta Aly, MD  COMBIGAN  0.2-0.5 % ophthalmic solution Place 1 drop into both eyes at bedtime.  04/19/16   [provider]  Continuous Blood Gluc Receiver (FREESTYLE LIBRE 2 READER) DEVI 1 each by Does not apply route daily. 05/31/22   Emilie Harden, MD  Continuous Blood Gluc Sensor (FREESTYLE LIBRE 2 SENSOR) MISC 1 each by Does not apply route every 14 (fourteen) days. 09/14/22   Emilie Harden, MD  diclofenac  Sodium (VOLTAREN ) 1 % GEL Apply 4 g topically 4 (four) times daily. 12/17/20   McGowen, Minetta Aly, MD   empagliflozin  (JARDIANCE ) 10 MG TABS tablet Take 1 tablet (10 mg total) by mouth daily. 03/06/23   Emilie Harden, MD  ENTRESTO  97-103 MG TAKE 1 TABLET TWO TIMES A DAY 06/01/23   Croitoru, Mihai, MD  eplerenone  (INSPRA ) 25 MG tablet TAKE 1 TABLET DAILY 03/27/23   Croitoru, Mihai, MD  gabapentin  (NEURONTIN ) 300 MG capsule TAKE 2 CAPSULES TWICE A DAY 07/02/23   McGowen, Minetta Aly, MD  insulin  aspart (NOVOLOG  FLEXPEN) 100 UNIT/ML FlexPen Inject 10 units before each meal 03/07/23   Emilie Harden, MD  insulin  glargine (LANTUS  SOLOSTAR) 100 UNIT/ML Solostar Pen Inject 34-36 Units into the skin daily. 07/04/23   Emilie Harden, MD  ketoconazole (NIZORAL) 2 % cream Apply 1 Application topically daily. 11/20/22   [provider]  ketorolac  (ACULAR ) 0.5 % ophthalmic solution  04/03/22   [provider]  lidocaine  (LIDODERM ) 5 % PLACE 1 PATCH ON THE SKIN DAILY. REMOVE AND DISCARD PATCH WITHIN 12 HOURS OR AS DIRECTED BY DOCTOR 08/02/23   McGowen, Minetta Aly, MD  liraglutide  (VICTOZA ) 18 MG/3ML SOPN Inject 1.8 mg into the skin daily. 01/30/23   Emilie Harden, MD  meclizine  (ANTIVERT ) 25 MG tablet TAKE 1 TABLET THREE TIMES A DAY AS NEEDED FOR DIZZINESS 07/02/23   McGowen, Minetta Aly, MD  metoprolol  succinate (TOPROL -XL) 50 MG 24 hr tablet TAKE 1 TABLET DAILY. TAKE WITH OR IMMEDIATELY FOLLLOWING A MEAL 03/12/23  McGowen, Minetta Aly, MD  naloxone  (NARCAN ) nasal spray 4 mg/0.1 mL 1 spray into nostril as needed for opioid overdose.  Repeat in 3 min if no improvement 04/10/23   McGowen, Minetta Aly, MD  omeprazole  (PRILOSEC) 40 MG capsule 1 cap po bid 12/04/22   McGowen, Minetta Aly, MD  oxybutynin  (DITROPAN -XL) 10 MG 24 hr tablet TAKE 1 TABLET AT BEDTIME 02/21/23   McGowen, Minetta Aly, MD  oxyCODONE -acetaminophen  (PERCOCET) 10-325 MG tablet 1-2 tabs po tid prn pain 04/06/23   McGowen, Minetta Aly, MD  rivaroxaban  (XARELTO ) 20 MG TABS tablet TAKE 1 TABLET DAILY WITH SUPPER 03/29/23   Croitoru, Mihai, MD  ROCKLATAN  0.02-0.005 % SOLN Apply to eye. 09/03/22   [provider]  rOPINIRole  (REQUIP ) 1 MG tablet TAKE 1 TABLET DAILY 07/31/23   McGowen, Minetta Aly, MD  rosuvastatin  (CRESTOR ) 20 MG tablet TAKE 1 TABLET DAILY 02/19/23   McGowen, Minetta Aly, MD  Semaglutide , 1 MG/DOSE, 4 MG/3ML SOPN Inject 1 mg as directed once a week. 02/23/23   Emilie Harden, MD  sertraline  (ZOLOFT ) 100 MG tablet TAKE 1 TABLET DAILY 02/19/23   McGowen, Minetta Aly, MD  SURE COMFORT PEN NEEDLES 31G X 5 MM MISC USE TO INJECT INSULIN  UNDER THE SKIN 07/02/23   McGowen, Minetta Aly, MD      Allergies    Patient has no known allergies.    Review of Systems   Review of Systems  Musculoskeletal:  Positive for neck pain.  Neurological:  Positive for dizziness.  Review of systems completed and notable as per HPI.  ROS otherwise negative.   Physical Exam Updated Vital Signs BP (!) 142/93   Pulse 89   Temp 98.4 F (36.9 C)   Resp 16   SpO2 99%  Physical Exam Vitals and nursing note reviewed.  Constitutional:      General: He is not in acute distress.    Appearance: He is well-developed.  HENT:     Head: Normocephalic and atraumatic.     Comments: Temporal artery nontender    Ears:     Comments: Mastoid normal    Nose: Nose normal.     Mouth/Throat:     Mouth: Mucous membranes are moist.     Pharynx: Oropharynx is clear.  Eyes:     Extraocular Movements: Extraocular movements intact.     Conjunctiva/sclera: Conjunctivae normal.     Pupils: Pupils are equal, round, and reactive to light.  Neck:     Comments: Patient with mild tenderness over the right lateral neck and trapezius.  It is nonswollen, no erythema or skin changes.  He has no tenderness or pain to the left side or midline cervical spine.  No meningismus. Cardiovascular:     Rate and Rhythm: Normal rate and regular rhythm.     Pulses: Normal pulses.     Heart sounds: Normal heart sounds. No murmur heard. Pulmonary:     Effort: Pulmonary effort is normal. No  respiratory distress.     Breath sounds: Normal breath sounds.  Abdominal:     Palpations: Abdomen is soft.     Tenderness: There is no abdominal tenderness. There is no guarding or rebound.  Musculoskeletal:        General: No swelling.     Cervical back: Normal range of motion and neck supple. Tenderness present. No rigidity.     Right lower leg: No edema.     Left lower leg: No edema.  Skin:    General: Skin  is warm and dry.     Capillary Refill: Capillary refill takes less than 2 seconds.  Neurological:     General: No focal deficit present.     Mental Status: He is alert and oriented to person, place, and time. Mental status is at baseline.     Cranial Nerves: No cranial nerve deficit.     Sensory: No sensory deficit.     Motor: No weakness.     Coordination: Coordination normal.     Gait: Gait normal.     Comments: No visual field cuts.  Psychiatric:        Mood and Affect: Mood normal.     ED Results / Procedures / Treatments   Labs (all labs ordered are listed, but only abnormal results are displayed) Labs Reviewed  BASIC METABOLIC PANEL - Abnormal; Notable for the following components:      Result Value   Glucose, Bld 121 (*)    Calcium  7.1 (*)    All other components within normal limits  CBC - Abnormal; Notable for the following components:   Platelets 123 (*)    All other components within normal limits  CBG MONITORING, ED - Abnormal; Notable for the following components:   Glucose-Capillary 122 (*)    All other components within normal limits  I-STAT CHEM 8, ED - Abnormal; Notable for the following components:   Potassium 6.0 (*)    BUN 32 (*)    Creatinine, Ser 1.40 (*)    Glucose, Bld 157 (*)    Calcium , Ion 1.03 (*)    Hemoglobin 18.0 (*)    HCT 53.0 (*)    All other components within normal limits  TROPONIN I (HIGH SENSITIVITY)  TROPONIN I (HIGH SENSITIVITY)    EKG EKG Interpretation Date/Time:  Wednesday August 08 2023 09:15:39  EST Ventricular Rate:  70 PR Interval:    QRS Duration:  166 QT Interval:  462 QTC Calculation: 498 R Axis:   -86  Text Interpretation: Ventricular-paced rhythm Abnormal ECG When compared with ECG of 12-Apr-2023 09:47, PREVIOUS ECG IS PRESENT Confirmed by Rueben Cote [id number]) on 08/08/2023 12:09:44 PM  Radiology CT ANGIO HEAD NECK W WO CM Result Date: 08/08/2023 CLINICAL DATA:  Neuro deficit, acute, stroke suspected ?cva EXAM: CT ANGIOGRAPHY HEAD AND NECK WITH AND WITHOUT CONTRAST TECHNIQUE: Multidetector CT imaging of the head and neck was performed using the standard protocol during bolus administration of intravenous contrast. Multiplanar CT image reconstructions and MIPs were obtained to evaluate the vascular anatomy. Carotid stenosis measurements (when applicable) are obtained utilizing NASCET criteria, using the distal internal carotid diameter as the denominator. RADIATION DOSE REDUCTION: This exam was performed according to the departmental dose-optimization program which includes automated exposure control, adjustment of the mA and/or kV according to patient size and/or use of iterative reconstruction technique. CONTRAST:  75mL OMNIPAQUE  IOHEXOL  350 MG/ML SOLN COMPARISON:  Head CT 03/22/18 FINDINGS: CT HEAD FINDINGS Brain: No hemorrhage. No hydrocephalus. No extra-axial fluid collection. No CT evidence of an acute cortical infarct. No mass effect. No mass lesion. Vascular: No hyperdense vessel or unexpected calcification. Skull: Normal. Negative for fracture or focal lesion. Sinuses/Orbits: No middle ear or mastoid effusion. Paranasal sinuses are clear. Bilateral lens replacement. Orbits are otherwise unremarkable. Other: None. Review of the MIP images confirms the above findings CTA NECK FINDINGS Aortic arch: Standard branching. Imaged portion shows no evidence of aneurysm or dissection. No significant stenosis of the major arch vessel origins. Right carotid system: No evidence of dissection,  stenosis (50% or greater), or occlusion. Approximately 40% narrowing in the proximal right ICA secondary to soft atherosclerotic plaque. Left carotid system: No evidence of dissection, stenosis (50% or greater), or occlusion. Vertebral arteries: Left dominant. No evidence of dissection. There is severe stenosis of the origin of the left vertebral artery Skeleton: Negative. Other neck: Negative. Upper chest: Left-sided triple lead pacemaker device in place. Left shoulder arthroplasty. Review of the MIP images confirms the above findings CTA HEAD FINDINGS Anterior circulation: No significant stenosis, proximal occlusion, aneurysm, or vascular malformation. Posterior circulation: No significant stenosis, proximal occlusion, aneurysm, or vascular malformation. Venous sinuses: As permitted by contrast timing, patent. Anatomic variants: Fetal PCAs bilaterally Review of the MIP images confirms the above findings IMPRESSION: 1. No hemorrhage or CT evidence of an acute cortical infarct. 2. No intracranial large vessel occlusion. 3. 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 4. Approximately 40% narrowing in the proximal right ICA secondary to soft atherosclerotic plaque. Electronically Signed   By: Clora Dane M.D.   On: 08/08/2023 10:53   DG Chest 2 View Result Date: 08/08/2023 CLINICAL DATA:  Severe neck pain. EXAM: CHEST - 2 VIEW COMPARISON:  03/28/2023. FINDINGS: Bilateral lung fields are clear. Bilateral costophrenic angles are clear. Normal cardio-mediastinal silhouette. There is a left sided 4-lead pacemaker. No acute osseous abnormalities. Note is made of left shoulder arthroplasty. The soft tissues are within normal limits. IMPRESSION: No active cardiopulmonary disease. Electronically Signed   By: Beula Brunswick M.D.   On: 08/08/2023 10:36    Procedures Procedures    Medications Ordered in ED Medications  lidocaine  (LIDODERM ) 5 % 1 patch (1 patch Transdermal Patch Applied 08/08/23 1214)   cyclobenzaprine  (FLEXERIL ) tablet 5 mg (5 mg Oral Given 08/08/23 0955)  acetaminophen  (TYLENOL ) tablet 975 mg (975 mg Oral Given 08/08/23 0955)  iohexol  (OMNIPAQUE ) 350 MG/ML injection 75 mL (75 mLs Intravenous Contrast Given 08/08/23 1035)    ED Course/ Medical Decision Making/ A&P                                 Medical Decision Making Amount and/or Complexity of Data Reviewed Labs: ordered. Radiology: ordered.  Risk Prescription drug management.   Medical Decision Making:   MITCH SHILLINGTON is a 83 y.o. male who presented to the ED today with right-sided neck pain.  Earlier had some dizziness as well which has resolved.  Here he is medically stable.  He is well-appearing.  His pain is improved with Flexeril , Tylenol .  He slightly tender over the right paraspinal muscles and trapezius and is very much reproducible and worse with movement.  I suspect is musculoskeletal likely a pinched nerve for muscle strain.  He did have improvement with lidocaine  patch as well.  He already had a CT angiogram which did not show any occlusion or dissection.  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  I spoke with Dr. Alecia Ames with neurology, recommends outpatient follow-up.  He is already anticoagulated.  His labwork is otherwise normal for stable thrombocytopenia.  Troponin is normal he is in no chest pain or think this is referred pain related to ACS.  He has no signs of stroke.  He is no fever or meningismus or headache, low suspicion for CNS infection.  He has 2 daughters both of which are medical providers.  I discussed the workup and plan  with him and the patient, they are all comfortable with plan for discharge home and symptomatic management.  Did discuss need for being careful taking Flexeril  as it can make you drowsy.  Discussed not driving while taking this.  I did discuss with patient and family that he needs to follow-up with his neurologist regarding  the ICA stenosis and vertebral artery stenosis.  Discussed return precautions for any signs of neurologic change or stroke.   Additional history discussed with patient's family/caregivers.  Patient placed on continuous vitals and telemetry monitoring while in ED which was reviewed periodically.  Reviewed and confirmed nursing documentation for past medical history, family history, social history.   Patient's presentation is most consistent with acute complicated illness / injury requiring diagnostic workup.           Final Clinical Impression(s) / ED Diagnoses Final diagnoses:  Neck pain    Rx / DC Orders ED Discharge Orders          Ordered    cyclobenzaprine  (FLEXERIL ) 5 MG tablet  2 times daily PRN        08/08/23 1344                Caterine Mcmeans H, MD 08/08/23 1614

## 2023-08-08 NOTE — ED Notes (Signed)
 Patient Alert and oriented to baseline. Stable and ambulatory to baseline. Patient verbalized understanding of the discharge instructions.  Patient belongings were taken by the patient.

## 2023-08-08 NOTE — ED Triage Notes (Signed)
 Pt arrives via EMS from home. Pt reports around 0100 this morning, he began experiencing pain to the right side of his neck. He also reports intermittent dizziness and generalized weakness. Pt is AxOx4. Denies chest pain or sob. Reports he has noted missed any doses of his xarelto .

## 2023-08-20 ENCOUNTER — Other Ambulatory Visit: Payer: Self-pay | Admitting: Family Medicine

## 2023-09-05 ENCOUNTER — Ambulatory Visit: Payer: Medicare Other | Admitting: *Deleted

## 2023-09-05 DIAGNOSIS — Z0000 Encounter for general adult medical examination without abnormal findings: Secondary | ICD-10-CM

## 2023-09-05 NOTE — Patient Instructions (Signed)
Mason [name] , Thank you for taking time to come for your Medicare Wellness Visit. I appreciate your ongoing commitment to your health goals. Please review the following plan we discussed and let me know if I can assist you in the future.   Screening recommendations/referrals: Colonoscopy: no longer required Recommended yearly ophthalmology/optometry visit for glaucoma screening and checkup Recommended yearly dental visit for hygiene and checkup  Vaccinations: Influenza vaccine: Education provided Pneumococcal vaccine: up to date Tdap vaccine: Education provided Shingles vaccine: up to date    Advanced directives: Education provided   Preventive Care 65 Years and Older, Male Preventive care refers to lifestyle choices and visits with your health care provider that can promote health and wellness. What does preventive care include? A yearly physical exam. This is also called an annual well check. Dental exams once or twice a year. Routine eye exams. Ask your health care provider how often you should have your eyes checked. Personal lifestyle choices, including: Daily care of your teeth and gums. Regular physical activity. Eating a healthy diet. Avoiding tobacco and drug use. Limiting alcohol use. Practicing safe sex. Taking low doses of aspirin every day. Taking vitamin and mineral supplements as recommended by your health care provider. What happens during an annual well check? The services and screenings done by your health care provider during your annual well check will depend on your age, overall health, lifestyle risk factors, and family history of disease. Counseling  Your health care provider may ask you questions about your: Alcohol use. Tobacco use. Drug use. Emotional well-being. Home and relationship well-being. Sexual activity. Eating habits. History of falls. Memory and ability to understand (cognition). Work and work Astronomer. Screening  You may have the  following tests or measurements: Height, weight, and BMI. Blood pressure. Lipid and cholesterol levels. These may be checked every 5 years, or more frequently if you are over 18 years old. Skin check. Lung cancer screening. You may have this screening every year starting at age 10 if you have a 30-pack-year history of smoking and currently smoke or have quit within the past 15 years. Fecal occult blood test (FOBT) of the stool. You may have this test every year starting at age 70. Flexible sigmoidoscopy or colonoscopy. You may have a sigmoidoscopy every 5 years or a colonoscopy every 10 years starting at age 45. Prostate cancer screening. Recommendations will vary depending on your family history and other risks. Hepatitis C blood test. Hepatitis B blood test. Sexually transmitted disease (STD) testing. Diabetes screening. This is done by checking your blood sugar (glucose) after you have not eaten for a while (fasting). You may have this done every 1-3 years. Abdominal aortic aneurysm (AAA) screening. You may need this if you are a current or former smoker. Osteoporosis. You may be screened starting at age 17 if you are at high risk. Talk with your health care provider about your test results, treatment options, and if necessary, the need for more tests. Vaccines  Your health care provider may recommend certain vaccines, such as: Influenza vaccine. This is recommended every year. Tetanus, diphtheria, and acellular pertussis (Tdap, Td) vaccine. You may need a Td booster every 10 years. Zoster vaccine. You may need this after age 40. Pneumococcal 13-valent conjugate (PCV13) vaccine. One dose is recommended after age 57. Pneumococcal polysaccharide (PPSV23) vaccine. One dose is recommended after age 2. Talk to your health care provider about which screenings and vaccines you need and how often you need them. This information is  not intended to replace advice given to you by your health care  provider. Make sure you discuss any questions you have with your health care provider. Document Released: 08/06/2015 Document Revised: 03/29/2016 Document Reviewed: 05/11/2015 Elsevier Interactive Patient Education  2017 [employer].  Fall Prevention in the Home Falls can cause injuries. They can happen to people of all ages. There are many things you can do to make your home safe and to help prevent falls. What can I do on the outside of my home? Regularly fix the edges of walkways and driveways and fix any cracks. Remove anything that might make you trip as you walk through a door, such as a raised step or threshold. Trim any bushes or trees on the path to your home. Use bright outdoor lighting. Clear any walking paths of anything that might make someone trip, such as rocks or tools. Regularly check to see if handrails are loose or broken. Make sure that both sides of any steps have handrails. Any raised decks and porches should have guardrails on the edges. Have any leaves, snow, or ice cleared regularly. Use sand or salt on walking paths during winter. Clean up any spills in your garage right away. This includes oil or grease spills. What can I do in the bathroom? Use night lights. Install grab bars by the toilet and in the tub and shower. Do not use towel bars as grab bars. Use non-skid mats or decals in the tub or shower. If you need to sit down in the shower, use a plastic, non-slip stool. Keep the floor dry. Clean up any water that spills on the floor as soon as it happens. Remove soap buildup in the tub or shower regularly. Attach bath mats securely with double-sided non-slip rug tape. Do not have throw rugs and other things on the floor that can make you trip. What can I do in the bedroom? Use night lights. Make sure that you have a light by your bed that is easy to reach. Do not use any sheets or blankets that are too big for your bed. They should not hang down onto the  floor. Have a firm chair that has side arms. You can use this for support while you get dressed. Do not have throw rugs and other things on the floor that can make you trip. What can I do in the kitchen? Clean up any spills right away. Avoid walking on wet floors. Keep items that you use a lot in easy-to-reach places. If you need to reach something above you, use a strong step stool that has a grab bar. Keep electrical cords out of the way. Do not use floor polish or wax that makes floors slippery. If you must use wax, use non-skid floor wax. Do not have throw rugs and other things on the floor that can make you trip. What can I do with my stairs? Do not leave any items on the stairs. Make sure that there are handrails on both sides of the stairs and use them. Fix handrails that are broken or loose. Make sure that handrails are as long as the stairways. Check any carpeting to make sure that it is firmly attached to the stairs. Fix any carpet that is loose or worn. Avoid having throw rugs at the top or bottom of the stairs. If you do have throw rugs, attach them to the floor with carpet tape. Make sure that you have a light switch at the top of the  stairs and the bottom of the stairs. If you do not have them, ask someone to add them for you. What else can I do to help prevent falls? Wear shoes that: Do not have high heels. Have rubber bottoms. Are comfortable and fit you well. Are closed at the toe. Do not wear sandals. If you use a stepladder: Make sure that it is fully opened. Do not climb a closed stepladder. Make sure that both sides of the stepladder are locked into place. Ask someone to hold it for you, if possible. Clearly mark and make sure that you can see: Any grab bars or handrails. First and last steps. Where the edge of each step is. Use tools that help you move around (mobility aids) if they are needed. These include: Canes. Walkers. Scooters. Crutches. Turn on the  lights when you go into a dark area. Replace any light bulbs as soon as they burn out. Set up your furniture so you have a clear path. Avoid moving your furniture around. If any of your floors are uneven, fix them. If there are any pets around you, be aware of where they are. Review your medicines with your doctor. Some medicines can make you feel dizzy. This can increase your chance of falling. Ask your doctor what other things that you can do to help prevent falls. This information is not intended to replace advice given to you by your health care provider. Make sure you discuss any questions you have with your health care provider. Document Released: 05/06/2009 Document Revised: 12/16/2015 Document Reviewed: 08/14/2014 Elsevier Interactive Patient Education  2017 [employer].

## 2023-09-05 NOTE — Progress Notes (Signed)
Subjective:   Mason [name] is a 83 y.o. male who presents for Medicare Annual/Subsequent preventive examination.  Visit Complete: Virtual I connected with  [name] on 09/05/23 by a audio enabled telemedicine application and verified that I am speaking with the correct person using two identifiers.  Patient Location: Home  Provider Location: Home Office  I discussed the limitations of evaluation and management by telemedicine. The patient expressed understanding and agreed to proceed.  Vital Signs: Because this visit was a virtual/telehealth visit, some criteria may be missing or patient reported. Any vitals not documented were not able to be obtained and vitals that have been documented are patient reported.    Cardiac Risk Factors include: advanced age (>79men, >70 women);hypertension;male gender;diabetes mellitus;family history of premature cardiovascular disease     Objective:    There were no vitals filed for this visit. There is no height or weight on file to calculate BMI.     09/05/2023    8:15 AM 08/08/2023    8:26 AM 09/01/2022    8:44 AM 08/31/2021    9:30 AM 07/08/2021   10:03 AM 12/12/2020    2:04 PM 09/03/2020    9:25 AM  Advanced Directives  Does Patient Have a Medical Advance Directive? Yes Yes Yes No No Yes Yes  Type of Advance Directive Living will Living will Healthcare Power of [place];Living will      Does patient want to make changes to medical advance directive?   No - Patient declined      Copy of Healthcare Power of Attorney in Chart?   No - copy requested      Would patient like information on creating a medical advance directive?    No - Patient declined No - Patient declined      Current Medications (verified) Outpatient Encounter Medications as of 09/05/2023  Medication Sig   BD INSULIN SYRINGE U/F 31G X 5/16" 1 ML MISC USE DAILY AS DIRECTED   COMBIGAN 0.2-0.5 % ophthalmic solution Place 1 drop into both eyes at bedtime.    Continuous Blood  Gluc Receiver (FREESTYLE LIBRE 2 READER) DEVI 1 each by Does not apply route daily.   Continuous Blood Gluc Sensor (FREESTYLE LIBRE 2 SENSOR) MISC 1 each by Does not apply route every 14 (fourteen) days.   cyclobenzaprine (FLEXERIL) 5 MG tablet Take 1 tablet (5 mg total) by mouth 2 (two) times daily as needed for muscle spasms.   diclofenac Sodium (VOLTAREN) 1 % GEL Apply 4 g topically 4 (four) times daily.   empagliflozin (JARDIANCE) 10 MG TABS tablet Take 1 tablet (10 mg total) by mouth daily.   ENTRESTO 97-103 MG TAKE 1 TABLET TWO TIMES A DAY   eplerenone (INSPRA) 25 MG tablet TAKE 1 TABLET DAILY   gabapentin (NEURONTIN) 300 MG capsule TAKE 2 CAPSULES TWICE A DAY   insulin aspart (NOVOLOG FLEXPEN) 100 UNIT/ML FlexPen Inject 10 units before each meal   insulin glargine (LANTUS SOLOSTAR) 100 UNIT/ML Solostar Pen Inject 34-36 Units into the skin daily.   ketoconazole (NIZORAL) 2 % cream Apply 1 Application topically daily.   ketorolac (ACULAR) 0.5 % ophthalmic solution    lidocaine (LIDODERM) 5 % PLACE 1 PATCH ON THE SKIN DAILY. REMOVE AND DISCARD PATCH WITHIN 12 HOURS OR AS DIRECTED BY DOCTOR   liraglutide (VICTOZA) 18 MG/3ML SOPN Inject 1.8 mg into the skin daily.   meclizine (ANTIVERT) 25 MG tablet TAKE 1 TABLET THREE TIMES A DAY AS NEEDED FOR DIZZINESS  metoprolol succinate (TOPROL-XL) 50 MG 24 hr tablet TAKE 1 TABLET DAILY. TAKE WITH OR IMMEDIATELY FOLLLOWING A MEAL   naloxone (NARCAN) nasal spray 4 mg/0.1 mL 1 spray into nostril as needed for opioid overdose.  Repeat in 3 min if no improvement   omeprazole (PRILOSEC) 40 MG capsule 1 cap po bid   oxybutynin (DITROPAN-XL) 10 MG 24 hr tablet TAKE 1 TABLET AT BEDTIME   oxyCODONE-acetaminophen (PERCOCET) 10-325 MG tablet 1-2 tabs po tid prn pain   rivaroxaban (XARELTO) 20 MG TABS tablet TAKE 1 TABLET DAILY WITH SUPPER   ROCKLATAN 0.02-0.005 % SOLN Apply to eye.   rOPINIRole (REQUIP) 1 MG tablet TAKE 1 TABLET DAILY   rosuvastatin (CRESTOR) 20  MG tablet TAKE 1 TABLET DAILY   Semaglutide, 1 MG/DOSE, 4 MG/3ML SOPN Inject 1 mg as directed once a week.   sertraline (ZOLOFT) 100 MG tablet TAKE 1 TABLET DAILY   SURE COMFORT PEN NEEDLES 31G X 5 MM MISC USE TO INJECT INSULIN UNDER THE SKIN   No facility-administered encounter medications on file as of 09/05/2023.    Allergies (verified) Patient has no known allergies.   History: Past Medical History:  Diagnosis Date   AICD (automatic cardioverter/defibrillator) present 2018   MDT CRT-D.  Fatigue-->completely pacer dependent.  Pacer settings adjusted 12/2017 to allow more chronotrophc variance with ADL's//exertion.   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   02/2016 EF 40%, diffuse hypokinesis, grade 2 DD.  Myoc perf imaging showed EF 32% 04/2016.  Pt upgraded to CRT-D 01/04/17.   Chronic renal insufficiency, stage III (moderate) (HCC) 2015   CrCl about 60 ml/min   Complete heart block (HCC)    Has dual chamber pacer.   COVID-19 virus infection 01/05/2021   paxlovid   Depression    DOE (dyspnea on exertion)    NYHA class II/III CHF   Dyspnea 2021   with exertion, bending over   Episodic low back pain 01/22/2013   w/intermittent radiculitis (12/2014 his neurologist referred him to pain mgmt for epidural steroid injection)   Erectile dysfunction 2019   due to zoloft--urol rx'd viagra   GERD (gastroesophageal reflux disease)    H/O tilt table evaluation 11/02/2005   negative   Helicobacter pylori gastritis 01/2016   History of adenomatous polyp of colon 10/12/2011   Dr. Elnoria Howard (3 right side of colon- tubular adenomas removed)   History of cardiovascular stress test 05/28/2012   no ischemia, EF  37%, imaging results are unchanged and within normal variance   History of chronic prostatitis    History of kidney stones    History of vertigo    + Hx of posterior HA's.  Neuro (Dr. Sandria Manly) eval 2011.  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   Hyperlipidemia    Hypertension    Lumbar spondylosis    lumbosacral radiculopathy at L4 by EMG testing, right foot drop (neurologist is Dr. Luberta Robertson with [facility] in [place])--neurologist referred him to neurosurgery   Migraine    "used to have them all the time; none for years" (01/04/2017)   Myocardial infarction [facility]) ?1970s   not entirely certain of this   Nephrolithiasis  07/2012   Left UVJ 2 mm stone with dilation of renal collecting system and slight hydroureter on right   Neuropathy    NICM (nonischemic cardiomyopathy) (HCC)    a. 02/2018 Cath: LM nl, LAD min irregs, LCX no, RCA 20d. [id number]. Fick CO/CI 4.4/2.0.   Osteoarthritis, multiple sites    Shoulders, back, knees   Pacemaker 02/05/2012   dual chamber, complete heart block, meddtronic revo, lasted checked 12/2015.  Since no CAD on cath 05/2016, cards recommends upgrade to CRT-D.   Permanent atrial fibrillation (HCC)    DCCV 07/09/13-converted, lasted two days, then back into afib--needs lifetime anticoagulation (Xarelto as of 09/2014)   Prostate cancer screening 09/2017   done by urol annually (normal prostate exam documented + PSA 0.84 as of 10/01/17 urol f/u.  10/2018 urol f/u PSA 0.6, no prostate nodule.   Rectus diastasis    Right ankle sprain 08/2017   w/distal fibula avulsion fx noted on u/s but not plain film-(Dr. Hudnall).   Skin cancer of arm, left    "burned it off" (01/04/2017)   TIA (transient ischemic attack)    L face and L arm weakness. Peri procedural->a. 03/22/2018 following cath. CT head neg. No MRI b/c has pacer. Likely due to embolus to distal branch of RMCA   Type II diabetes  mellitus (HCC)    Past Surgical History:  Procedure Laterality Date   ABI's Bilateral 05/21/2018   normal   BACK SURGERY     BIV ICD INSERTION CRT-D N/A 01/04/2017   Procedure: BiV ICD ;  Surgeon: Regan Lemming, MD;  Location[facility] INVASIVE CV LAB;  Service: Cardiovascular;  Laterality: N/A;   CARDIAC CATHETERIZATION N/A 06/14/2016   Minimal nonobstructive dz, EF 25-35%.  Procedure: Left Heart Cath and Coronary Angiography;  Surgeon: Peter M Swaziland, MD;  Location: [facility] INVASIVE CV LAB;  Service: Cardiovascular;  Laterality: N/A;   CARDIOVASCULAR STRESS TEST  2012   2012 nuclear perfusion study: low risk scan; 04/2016 normal myocardial perfusion imaging, EF 32%.   CARDIOVERSION  07/09/2012   Procedure: CARDIOVERSION;  Surgeon: Thurmon Fair, MD;  Location: [facility] ENDOSCOPY;  Service: Cardiovascular;  Laterality: N/A;   CATARACT EXTRACTION W/ INTRAOCULAR LENS IMPLANT & ANTERIOR VITRECTOMY, BILATERAL Bilateral    CIRCUMCISION N/A 03/23/2020   Procedure: CIRCUMCISION ADULT;  Surgeon: Belva Agee, MD;  Location: [facility] ORS;  Service: Urology;  Laterality: N/A;   COLONOSCOPY W/ POLYPECTOMY  approx 2006; repeated 09/2011   Polyps on 2013 EGD as well, repeat 12/2014   COLONOSCOPY WITH PROPOFOL N/A 07/08/2021   adenoma x 1. Procedure: COLONOSCOPY WITH PROPOFOL;  Surgeon: Jeani Hawking, MD;  Location: [facility] ENDOSCOPY;  Service: Endoscopy;  Laterality: N/A;   ESOPHAGOGASTRODUODENOSCOPY  10/18/2006   Done due to chronic GERD: Normal, bx showed no barrett's esophagus (Dr. Elnoria Howard)   EYE SURGERY  01/08/2023   FLEXOR TENDON REPAIR Left 10/02/2016   Procedure: LEFT RING FINGER WOUND EXPORATION AND FLEXOR TENDON REPAIR AND NERVE REPAIR;  Surgeon: Mack Hook, MD;  Location: [facility] OR;  Service: Orthopedics;  Laterality: Left;   INSERT / REPLACE / REMOVE PACEMAKER  02/05/2012   dual chamber, sinus node dysfunction, sinus arrest, PAF, Medtronic Revo serial#[id number] H: last checked 05/2015   LUMBAR LAMINECTOMY  Left 1976   L4-5   PACEMAKER REMOVAL  01/04/2017   PERMANENT PACEMAKER INSERTION N/A 02/05/2012   Procedure: PERMANENT PACEMAKER INSERTION;  Surgeon: Thurmon Fair, MD; Generator Medtronic [place] model [place] serial number [id number] H Laterality: N/A;   POLYPECTOMY  07/08/2021  Procedure: POLYPECTOMY;  Surgeon: Jeani Hawking, MD;  Location: [facility] ENDOSCOPY;  Service: Endoscopy;;   RETINAL DETACHMENT SURGERY Left ~ 1999   REVERSE SHOULDER ARTHROPLASTY Left 2018   Left shoulder reverse TSA ([facility] in [place]).   RIGHT/LEFT HEART CATH AND CORONARY ANGIOGRAPHY N/A 03/22/2018   EF 30-35%, no CAD.  Procedure: RIGHT/LEFT HEART CATH AND CORONARY ANGIOGRAPHY;  Surgeon: Dolores Patty, MD;  Location: [facility] INVASIVE CV LAB;  Service: Cardiovascular;  Laterality: N/A;   TRANSTHORACIC ECHOCARDIOGRAM  08/25/10; 05/2012; 03/23/16;12/2017   mild asymmetric LVH, normal systolic function, normal diastolic fxn, mild-to-mod mitral regurg, mild aortic valve sclerosis and trace AI, mild aortic root dilatation. 2014 f/u showed EF 40-45%, mod LAE, A FIB.  02/2016 EF 40%, diffuse hypokinesis, grade 2 DD. 12/2017 EF 35-40%,diffuse hypokin,grd III DD, mild MR   WISDOM TOOTH EXTRACTION  06/27/2021   EF 50-55%, mild aortic root/ascending aorta dilatation (41-42 mm).   Family History  Problem Relation Age of Onset   Heart failure Mother    Stroke Mother    Stroke Father    Heart disease Sister    Heart disease Sister    Cancer Sister        liver   Cancer Brother        lung   Cancer Brother        lung   Heart disease Brother    Social History   Socioeconomic History   Marital status: Married    Spouse name: Not on file   Number of children: Not on file   Years of education: Not on file   Highest education level: 12th grade  Occupational History   Not on file  Tobacco Use   Smoking status: Never   Smokeless tobacco: Never  Vaping Use   Vaping status: Never Used  Substance and Sexual Activity    Alcohol use: Yes    Comment: occ   Drug use: No   Sexual activity: Not Currently  Other Topics Concern   Not on file  Social History Narrative   Not on file   Social Drivers of Health   Financial Resource Strain: Low Risk  (09/05/2023)   Overall Financial Resource Strain (CARDIA)    Difficulty of Paying Living Expenses: Not very hard  Food Insecurity: No Food Insecurity (09/05/2023)   Hunger Vital Sign    Worried About Running Out of Food in the Last Year: Never true    Ran Out of Food in the Last Year: Never true  Transportation Needs: No Transportation Needs (09/05/2023)   PRAPARE - Administrator, Civil Service (Medical): No    Lack of Transportation (Non-Medical): No  Physical Activity: Inactive (09/05/2023)   Exercise Vital Sign    Days of Exercise per Week: 0 days    Minutes of Exercise per Session: 0 min  Stress: No Stress Concern Present (09/05/2023)   [employer] of Occupational Health - Occupational Stress Questionnaire    Feeling of Stress : Not at all  Social Connections: Moderately Integrated (09/05/2023)   Social Connection and Isolation Panel [NHANES]    Frequency of Communication with Friends and Family: More than three times a week    Frequency of Social Gatherings with Friends and Family: More than three times a week    Attends Religious Services: More than 4 times per year    Active Member of [employer] or Organizations: No    Attends Banker Meetings: Never    Marital Status:  Married    Tobacco Counseling Counseling given: Not Answered   Clinical Intake:  Pre-visit preparation completed: Yes  Pain : No/denies pain     Diabetes: Yes CBG done?: No Did pt. bring in CBG monitor from home?: No  How often do you need to have someone help you when you read instructions, pamphlets, or other written materials from your doctor or pharmacy?: 1 - Never  Interpreter Needed?: No  Information entered by :: Remi Haggard  LPN   Activities of Daily Living    09/05/2023    8:19 AM  In your present state of health, do you have any difficulty performing the following activities:  Hearing? 1  Vision? 0  Difficulty concentrating or making decisions? 0  Walking or climbing stairs? 0  Dressing or bathing? 0  Doing errands, shopping? 0  Preparing Food and eating ? N  Using the Toilet? N  In the past six months, have you accidently leaked urine? N  Do you have problems with loss of bowel control? N  Managing your Medications? N  Managing your Finances? N  Housekeeping or managing your Housekeeping? N    Patient Care Team: Jeoffrey Massed, MD as PCP - General (Family Medicine) Regan Lemming, MD as PCP - Electrophysiology (Cardiology) Croitoru, Rachelle Hora, MD as PCP - Cardiology (Cardiology) Jeani Hawking, MD as Consulting Physician (Gastroenterology) Ihor Gully, MD (Inactive) as Consulting Physician (Urology) Lennette Bihari, MD as Consulting Physician (Cardiology) Bartholomew Crews, MD as Referring Physician (Neurology) Ernesto Rutherford, MD as Consulting Physician (Ophthalmology) Windy Kalata, DO as Consulting Physician (Orthopedic Surgery) Luellen Pucker (Dentistry) Vedia Coffer Vaughan Sine, PA-C as Consulting Physician (Physician Assistant) Julio Sicks, MD as Consulting Physician (Neurosurgery) Floreen Comber, NP as Nurse Practitioner (Neurosurgery)  Indicate any recent Medical Services you may have received from other than [facility] providers in the past year (date may be approximate).     Assessment:   This is a routine wellness examination for Arun.  Hearing/Vision screen Hearing Screening - Comments:: Bilateral hearing aids Vision Screening - Comments:: Up to date [facility]   Goals Addressed             This Visit's Progress    Patient Stated       Stay alive       Depression Screen    09/05/2023    8:22 AM 04/06/2023    8:54 AM 01/15/2023   11:13 AM 12/04/2022   10:16  AM 09/01/2022    8:48 AM 05/10/2022    1:29 PM 11/07/2021    3:59 PM  PHQ 2/9 Scores  PHQ - 2 Score 2 6 2 2  0 0 2  PHQ- 9 Score 4 20 7 8        Fall Risk    09/05/2023    8:14 AM 04/06/2023    8:53 AM 09/10/2022    3:25 PM 09/01/2022    8:47 AM 05/10/2022    1:29 PM  Fall Risk   Falls in the past year? 1 1 1 1 1   Number falls in past yr: 0 1 1 0 0  Injury with Fall? 0 0 0 0 0  Risk for fall due to :  No Fall Risks   No Fall Risks  Follow up Falls evaluation completed;Education provided;Falls prevention discussed Falls evaluation completed   Falls evaluation completed    MEDICARE RISK AT HOME: Medicare Risk at Home Any stairs in or around the home?: No If so, are there any without  handrails?: No Home free of loose throw rugs in walkways, pet beds, electrical cords, etc?: Yes Adequate lighting in your home to reduce risk of falls?: Yes Life alert?: No Use of a cane, walker or w/c?: No Grab bars in the bathroom?: Yes Shower chair or bench in shower?: No Elevated toilet seat or a handicapped toilet?: No  TIMED UP AND GO:  Was the test performed?  No    Cognitive Function:    07/30/2018   10:50 AM  MMSE - Mini Mental State Exam  Orientation to time 5  Orientation to Place 5  Registration 3  Attention/ Calculation 3  Recall 2  Language- name 2 objects 2  Language- repeat 1  Language- follow 3 step command 3  Language- read & follow direction 1  Write a sentence 1  Copy design 1  Total score 27        09/05/2023    8:16 AM 09/01/2022    8:47 AM 08/31/2021    9:36 AM  6CIT Screen  What Year? 0 points 0 points 0 points  What month? 0 points 0 points 0 points  What time? 0 points 0 points 0 points  Count back from 20 0 points 0 points 0 points  Months in reverse 0 points 0 points 0 points  Repeat phrase 0 points 0 points 2 points  Total Score 0 points 0 points 2 points    Immunizations Immunization History  Administered Date(s) Administered   Fluad Quad(high Dose  65+) 05/19/2019, 05/10/2022   Influenza, High Dose Seasonal PF 05/26/2013, 05/20/2015, 06/26/2016, 05/11/2017, 04/24/2018, 05/22/2020   Influenza,inj,Quad PF,6+ Mos 06/15/2014   Influenza-Unspecified 05/25/2019   Moderna Covid-19 Fall Seasonal Vaccine 60yrs & older 08/08/2022   Moderna Covid-19 Vaccine Bivalent Booster 60yrs & up 06/30/2021   Moderna Sars-Covid-2 Vaccination 08/26/2019, 09/16/2019, 06/23/2020   Pneumococcal Conjugate-13 10/15/2014   Pneumococcal Polysaccharide-23 07/24/2010, 10/12/2020   Rsv, Bivalent, Protein Subunit Rsvpref,pf Verdis Frederickson) 12/25/2022   Td (Adult),5 Lf Tetanus Toxid, Preservative Free 12/25/2022   Tdap 08/30/2011   Zoster Recombinant(Shingrix) 10/12/2020, 01/02/2022    TDAP status: Due, Education has been provided regarding the importance of this vaccine. Advised may receive this vaccine at local pharmacy or Health Dept. Aware to provide a copy of the vaccination record if obtained from local pharmacy or Health Dept. Verbalized acceptance and understanding.  Flu Vaccine status: Due, Education has been provided regarding the importance of this vaccine. Advised may receive this vaccine at local pharmacy or Health Dept. Aware to provide a copy of the vaccination record if obtained from local pharmacy or Health Dept. Verbalized acceptance and understanding.  Pneumococcal vaccine status: Up to date  Covid-19 vaccine status: Declined, Education has been provided regarding the importance of this vaccine but patient still declined. Advised may receive this vaccine at local pharmacy or Health Dept.or vaccine clinic. Aware to provide a copy of the vaccination record if obtained from local pharmacy or Health Dept. Verbalized acceptance and understanding.  Qualifies for Shingles Vaccine? Yes   Zostavax completed Yes   Shingrix Completed?: Yes  Screening Tests Health Maintenance  Topic Date Due   INFLUENZA VACCINE  02/22/2023   COVID-19 Vaccine (6 - 2024-25 season)  03/25/2023   Diabetic kidney evaluation - Urine ACR  12/04/2023   HEMOGLOBIN A1C  12/09/2023   OPHTHALMOLOGY EXAM  01/11/2024   FOOT EXAM  03/19/2024   Diabetic kidney evaluation - eGFR measurement  08/07/2024   Medicare Annual Wellness (AWV)  09/04/2024   Pneumonia Vaccine  55+ Years old  Completed   Zoster Vaccines- Shingrix  Completed   HPV VACCINES  Aged Out   DTaP/Tdap/Td  Discontinued    Health Maintenance  Health Maintenance Due  Topic Date Due   INFLUENZA VACCINE  02/22/2023   COVID-19 Vaccine (6 - 2024-25 season) 03/25/2023    Colorectal cancer screening: No longer required.   Lung Cancer Screening: (Low Dose CT Chest recommended if Age 45-80 years, 20 pack-year currently smoking OR have quit w/in 15years.) does not qualify.   Lung Cancer Screening Referral:   Additional Screening:  Hepatitis C Screening: does not qualify;  Vision Screening: Recommended annual ophthalmology exams for early detection of glaucoma and other disorders of the eye. Is the patient up to date with their annual eye exam?  Yes  Who is the provider or what is the name of the office in which the patient attends annual eye exams? [facility] If pt is not established with a provider, would they like to be referred to a provider to establish care? No .   Dental Screening: Recommended annual dental exams for proper oral hygiene  Nutrition Risk Assessment:  Has the patient had any N/V/D within the last 2 months?  No  Does the patient have any non-healing wounds?  No  Has the patient had any unintentional weight loss or weight gain?  No   Diabetes:  Is the patient diabetic?  Yes  If diabetic, was a CBG obtained today?  No  Did the patient bring in their glucometer from home?  No  How often do you monitor your CBG's? Continuous Blood Glucose monitor  .   Financial Strains and Diabetes Management:  Are you having any financial strains with the device, your supplies or your medication? No .   Does the patient want to be seen by Chronic Care Management for management of their diabetes?  No  Would the patient like to be referred to a Nutritionist or for Diabetic Management?  No   Diabetic Exams:  Diabetic Eye Exam: Completed .  Pt has been advised about the importance in completing this exam.  Diabetic Foot Exam: . Pt has been advised about the importance in completing this exam..    Community Resource Referral / Chronic Care Management: CRR required this visit?  No   CCM required this visit?  No     Plan:     I have personally reviewed and noted the following in the patient's chart:   Medical and social history Use of alcohol, tobacco or illicit drugs  Current medications and supplements including opioid prescriptions. Patient is not currently taking opioid prescriptions. Functional ability and status Nutritional status Physical activity Advanced directives List of other physicians Hospitalizations, surgeries, and ER visits in previous 12 months Vitals Screenings to include cognitive, depression, and falls Referrals and appointments  In addition, I have reviewed and discussed with patient certain preventive protocols, quality metrics, and best practice recommendations. A written personalized care plan for preventive services as well as general preventive health recommendations were provided to patient.     Remi Haggard, LPN   [id number]/[id number]   After Visit Summary: (MyChart) Due to this being a telephonic visit, the after visit summary with patients personalized plan was offered to patient via MyChart   Nurse Notes:

## 2023-09-06 NOTE — Progress Notes (Signed)
Remote ICD transmission.

## 2023-09-19 ENCOUNTER — Ambulatory Visit: Payer: Self-pay | Admitting: Family Medicine

## 2023-09-19 NOTE — Telephone Encounter (Signed)
 Copied from CRM [id number]. Topic: Clinical - Red Word Triage >> Sep 19, 2023  2:38 PM Theodis Sato wrote: Red Word that prompted transfer to Nurse Triage: Extreme pain from extreme heart burn.   Chief Complaint: Abdominal pain Symptoms: Upper abdominal pain Frequency: Intermittent  Pertinent Negatives: Patient denies chest pain, shortness of breath, nausea  Disposition: [] ED /[] Urgent Care (no appt availability in office) / [x] Appointment(In office/virtual)/ []  [facility]/ [] Home Care/ [x] Refused Recommended Disposition /[] [facility]/ []  Follow-up with PCP Additional Notes: Patient reports for the last 3 days he has been experiencing upper abdominal pain with a sour taste in his mouth after eating. He states his symptoms last about 2 minutes and then begin to resolve. He denies any nausea, chest pain, or shortness of breath. Patient offered appointment but he declined, stating he sees a GI doctor next week and will go to urgent care if his symptoms get much worse.     Reason for Disposition  [1] Abdominal pain is intermittent AND [2] shoots into chest, with sour taste in mouth  Answer Assessment - Initial Assessment Questions 1. LOCATION: "Where does it hurt?"      Upper abdomen  2. RADIATION: "Does the pain shoot anywhere else?" (e.g., chest, back)     No 3. ONSET: "When did the pain begin?" (e.g., minutes, hours or days ago)      3 days ago  4. SUDDEN: "Gradual or sudden onset?"     Sudden  5. PATTERN "Does the pain come and go, or is it constant?"    - If it comes and goes: "How long does it last?" "Do you have pain now?"     (Note: Comes and goes means the pain is intermittent. It goes away completely between bouts.)    - If constant: "Is it getting better, staying the same, or getting worse?"      (Note: Constant means the pain never goes away completely; most serious pain is constant and gets worse.)      Intermittent, present with eating  6. SEVERITY: "How  bad is the pain?"  (e.g., Scale 1-10; mild, moderate, or severe)    - MILD (1-3): Doesn't interfere with normal activities, abdomen soft and not tender to touch..     - MODERATE (4-7): Interferes with normal activities or awakens from sleep, abdomen tender to touch.     - SEVERE (8-10): Excruciating pain, doubled over, unable to do any normal activities.       10/10 7. RECURRENT SYMPTOM: "Have you ever had this type of stomach pain before?" If Yes, ask: "When was the last time?" and "What happened that time?"      No 8. AGGRAVATING FACTORS: "Does anything seem to cause this pain?" (e.g., foods, stress, alcohol)     Eating food exacerbates  9. CARDIAC SYMPTOMS: "Do you have any of the following symptoms: chest pain, difficulty breathing, sweating, nausea?"     No 10. OTHER SYMPTOMS: "Do you have any other symptoms?" (e.g., back pain, diarrhea, fever, urination pain, vomiting)       No  Protocols used: Abdominal Pain - Upper-A-AH

## 2023-09-25 ENCOUNTER — Other Ambulatory Visit: Payer: Self-pay | Admitting: Cardiovascular Disease

## 2023-09-25 DIAGNOSIS — I4821 Permanent atrial fibrillation: Secondary | ICD-10-CM

## 2023-09-25 NOTE — Telephone Encounter (Signed)
 Prescription refill request for Xarelto received.  Indication:afib Last office visit:10/24 Weight:101.2  kg Age:83 Scr:1.4 1/25 CrCl:59.23  ml/min  Prescription refilled

## 2023-09-26 ENCOUNTER — Other Ambulatory Visit: Payer: Self-pay | Admitting: Gastroenterology

## 2023-09-26 ENCOUNTER — Telehealth: Payer: Self-pay | Admitting: *Deleted

## 2023-09-26 DIAGNOSIS — R1310 Dysphagia, unspecified: Secondary | ICD-10-CM | POA: Diagnosis not present

## 2023-09-26 DIAGNOSIS — I482 Chronic atrial fibrillation, unspecified: Secondary | ICD-10-CM | POA: Diagnosis not present

## 2023-09-26 DIAGNOSIS — K219 Gastro-esophageal reflux disease without esophagitis: Secondary | ICD-10-CM | POA: Diagnosis not present

## 2023-09-26 NOTE — Telephone Encounter (Signed)
 Patient with diagnosis of Afib on Xarelto for anticoagulation.    Procedure: EGD Date of procedure: 10/19/23   CHA2DS2-VASc Score = 8   This indicates a 10.8% annual risk of stroke. The patient's score is based upon: CHF History: 1 HTN History: 1 Diabetes History: 1 Stroke History: 2 Vascular Disease History: 1 Age Score: 2 Gender Score: 0    CrCl 75 mL/min Platelet count 123 K  Per office protocol, patient can hold Xarelto for 1-2 days prior to procedure.     **This guidance is not considered finalized until pre-operative APP has relayed final recommendations.**

## 2023-09-26 NOTE — Telephone Encounter (Signed)
   Pre-operative Risk Assessment    Patient Name: Mason [name]  DOB: [birth year] MRN: [id number]   Date of last office visit: 04/12/23 DR. CROITORU Date of next office visit: NONE   Request for Surgical Clearance    Procedure:   EGD  Date of Surgery:  Clearance 10/19/23                                Surgeon:  DR. HUNG Surgeon's Group or Practice Name:  [facility] Phone number:  [phone] Fax number:  [phone]   Type of Clearance Requested:   - Pharmacy:  Hold Rivaroxaban (Xarelto)     Type of Anesthesia:   PROPOFOL   Additional requests/questions:    Mason [name]   09/26/2023, 11:24 AM

## 2023-09-26 NOTE — Telephone Encounter (Signed)
   Patient Name: Mason [name]  DOB: 1940-12-03 MRN: [id number]  Primary Cardiologist: Thurmon Fair, MD  Clinical pharmacists have reviewed the patient's past medical history, labs, and current medications as part of preoperative protocol coverage. The following recommendations have been made:  Per office protocol, patient can hold Xarelto for 1-2 days prior to procedure   I will route this recommendation to the requesting party via Epic fax function and remove from pre-op pool.  Please call with questions.  Denyce Robert, NP 09/26/2023, 1:51 PM

## 2023-09-26 NOTE — Telephone Encounter (Signed)
 Pharmacy please advise on holding Xarelto prior to EGD scheduled for 10/19/2023. Thank you.

## 2023-10-01 DIAGNOSIS — M5416 Radiculopathy, lumbar region: Secondary | ICD-10-CM | POA: Diagnosis not present

## 2023-10-11 ENCOUNTER — Ambulatory Visit (INDEPENDENT_AMBULATORY_CARE_PROVIDER_SITE_OTHER): Payer: Medicare Other | Admitting: Internal Medicine

## 2023-10-11 ENCOUNTER — Encounter: Payer: Self-pay | Admitting: Internal Medicine

## 2023-10-11 VITALS — BP 106/64 | HR 82 | Ht 71.0 in | Wt 218.0 lb

## 2023-10-11 DIAGNOSIS — Z7985 Long-term (current) use of injectable non-insulin antidiabetic drugs: Secondary | ICD-10-CM

## 2023-10-11 DIAGNOSIS — E785 Hyperlipidemia, unspecified: Secondary | ICD-10-CM | POA: Diagnosis not present

## 2023-10-11 DIAGNOSIS — E1165 Type 2 diabetes mellitus with hyperglycemia: Secondary | ICD-10-CM | POA: Diagnosis not present

## 2023-10-11 DIAGNOSIS — E1159 Type 2 diabetes mellitus with other circulatory complications: Secondary | ICD-10-CM | POA: Diagnosis not present

## 2023-10-11 DIAGNOSIS — Z794 Long term (current) use of insulin: Secondary | ICD-10-CM

## 2023-10-11 LAB — POCT GLYCOSYLATED HEMOGLOBIN (HGB A1C): Hemoglobin A1C: 7.1 % — AB (ref 4.0–5.6)

## 2023-10-11 MED ORDER — SEMAGLUTIDE (1 MG/DOSE) 4 MG/3ML ~~LOC~~ SOPN: 1.0000 mg | PEN_INJECTOR | SUBCUTANEOUS | 3 refills | Status: AC

## 2023-10-11 NOTE — Patient Instructions (Addendum)
 Please hold: - Jardiance   Restart: - Ozempic 1 mg weekly  Continue: - Lantus 32 units at bedtime - NovoLog 15 min before meals: 10-12 units before brunch 8-10 units before dinner If you have to take the suppertime insulin after the meal, take only up to 5 units.  Please return in 3-4 months.[name]

## 2023-10-11 NOTE — Progress Notes (Signed)
 Patient ID: Mason [name], male   DOB: 1941-07-11, 83 y.o.   MRN: [id number]   HPI: Mason [name] is a 83 y.o.-year-old male, initially referred by his PCP, Dr. Milinda Cave, returning for follow-up for DM2, dx in ~2011, insulin-dependent since 2019, uncontrolled, with long-term complications (CAD with ?h/o AMI, nonischemic CMP, CHF, Afib, cerebrovascular disease with h/o TIA, CKD stage III, peripheral neuropathy, ED).  Last visit 4 months ago.    Interim history: No increased urination, nausea, chest pain.  He has back pain - had a steroid inj last week. He complains of very dry mouth, quite bothersome.  He is wondering if Marcelline Deist is causing this.  He also craves sweets.  Reviewed HbA1c levels:  Lab Results  Component Value Date   HGBA1C 6.6 (A) 06/11/2023   HGBA1C 7.1 (A) 02/08/2023   HGBA1C 7.1 (A) 09/29/2022   HGBA1C 6.6 (A) 05/31/2022   HGBA1C 7.3 (A) 01/26/2022   HGBA1C 7.2 (A) 09/08/2021   HGBA1C 6.7 (A) 12/09/2020   HGBA1C 7.6 (A) 09/09/2020   HGBA1C 9.7 (H) 06/22/2020   HGBA1C 9.0 (H) 03/23/2020   HGBA1C 9.2 (H) 03/16/2020   HGBA1C 9.9 (H) 11/20/2019   HGBA1C 9.7 (H) 05/27/2019   HGBA1C 9.0 (H) 02/26/2019   HGBA1C 10.1 (H) 11/29/2018   HGBA1C 9.0 (A) 08/29/2018   HGBA1C 9.0 08/29/2018   HGBA1C 9.0 (A) 08/29/2018   HGBA1C 9.0 (A) 08/29/2018   HGBA1C 7.8 (A) 05/14/2018   HGBA1C 8.1 (H) 02/06/2018   HGBA1C 8.0 (H) 10/26/2017   HGBA1C 8.2 (H) 07/27/2017   HGBA1C 7.0 03/01/2017   HGBA1C 7.1 11/16/2016   HGBA1C 7.8 (H) 07/03/2016   HGBA1C 6.8 03/14/2016   HGBA1C 7.0 (H) 12/09/2015   HGBA1C 7.4 (H) 09/20/2015   HGBA1C 7.2 (H) 03/25/2015   04/11/2021: HbA1c 7.1%  He was previously on: - Jardiance 10 mg before breakfast - Trulicity 1.5 >> 3  mg weekly - NovoLog pens :  14-18 >> 18-20 >> 15-16 >> 15 >> 10 >> but taking 12 units before brunch 8-10 units before dinner >> 0 units >> 7-8 >> but taking 10 units before a larger dinner  If you have a snack at night, you may need ~5  units before the snack - Lantus vial 44 >> 40 >> 36-40 >> 42 >> 45 >> 40 >> but taking 42 units at bedtime She was on metformin in the past.  Now on: - Jardiance 10 mg before breakfast - Ozempic 1 mg weekly -just ran out - Lantus 36 >> 42 >> 38 >> 34 >> 32 units at bedtime - NovoLog 15 min before meals: 8-10 >> 12 units before brunch 6-8 >> 10 units before dinner If you have to take the suppertime insulin after the meal, take only up to 5 units. He was previously on Trulicity.  Pt checks his sugars more than 4 times a day with his freestyle libre CGM:  Previously:  Previous:  Lowest sugar was: 44 >> 48 >> 45; he has hypoglycemia awareness at 70.  Highest sugar was 400 >> .[name].  309 >> 235 >> 200s.  Glucometer: Freestyle  Pt's meals are: - Breakfast: cereals, eggs, grits, toast - Lunch: may skip or sandwich - Dinner: meat + veggies + starch - Snacks: apple/cheese >> bananasplits at night Patient saw nutrition a long time ago.  -+ Stage III CKD, last BUN/creatinine:  Lab Results  Component Value Date   BUN 32 (H) 08/08/2023   BUN 17 08/08/2023  CREATININE 1.40 (H) 08/08/2023   CREATININE 1.10 08/08/2023    Lab Results  Component Value Date   MICRALBCREAT 0.9 12/04/2022   MICRALBCREAT 1.0 11/20/2019   MICRALBCREAT 1.4 02/06/2018   MICRALBCREAT 0.7 11/16/2016   MICRALBCREAT 0.5 10/15/2014   MICRALBCREAT 0.1 10/14/2013  On Entresto.  -+ HL; last set of lipids: Lab Results  Component Value Date   CHOL 116 04/06/2023   HDL 42.20 04/06/2023   LDLCALC 45 04/06/2023   LDLDIRECT 103.0 06/22/2020   TRIG 145.0 04/06/2023   CHOLHDL 3 04/06/2023  04/11/2021: 141/214/45/53 Previously on Zocor 20, but changed to Crestor 20 mg daily.  - last eye exam was 01/11/2023:+ DR. He also has glaucoma.  - + numbness and tingling in his feet.  On Neurontin 300 mg twice a day.   Seeing podiatry - Dr. Yetta Barre at the [facility].  Latest foot exam 03/20/2023.  No known FH of DM.  He also  has a history of HTN, nephrolithiasis, GERD, chronic fungal balanitis and phimosis - s/p circumcision 02/2020, skin cancer.  Other labs:    Drinks beer 2-3x a day, but not quite every day.  ROS: + see HPI  I reviewed pt's medications, allergies, PMH, social hx, family hx, and changes were documented in the history of present illness. Otherwise, unchanged from my initial visit note.  Past Medical History:  Diagnosis Date   AICD (automatic cardioverter/defibrillator) present 2018   MDT CRT-D.  Fatigue-->completely pacer dependent.  Pacer settings adjusted 12/2017 to allow more chronotrophc variance with ADL's//exertion.   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   02/2016 EF 40%, diffuse hypokinesis, grade 2 DD.  Myoc perf imaging showed EF 32% 04/2016.  Pt upgraded to CRT-D 01/04/17.   Chronic renal insufficiency, stage III (moderate) (HCC) 2015   CrCl about 60 ml/min   Complete heart block (HCC)    Has dual chamber pacer.   COVID-19 virus infection 01/05/2021   paxlovid   Depression    DOE (dyspnea on exertion)    NYHA class II/III CHF   Dyspnea 2021   with exertion, bending over   Episodic low back pain 01/22/2013   w/intermittent radiculitis (12/2014 his neurologist referred him to pain mgmt for epidural steroid injection)   Erectile dysfunction 2019   due to zoloft--urol rx'd viagra   GERD (gastroesophageal reflux disease)    H/O tilt table evaluation 11/02/2005   negative   Helicobacter pylori gastritis 01/2016   History of adenomatous polyp of colon 10/12/2011   Dr. Elnoria Howard (3 right side of colon- tubular adenomas removed)   History of cardiovascular stress test 05/28/2012   no ischemia,  EF 37%, imaging results are unchanged and within normal variance   History of chronic prostatitis    History of kidney stones    History of vertigo    + Hx of posterior HA's.  Neuro (Dr. Sandria Manly) eval 2011.  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   Hyperlipidemia    Hypertension    Lumbar spondylosis    lumbosacral radiculopathy at L4 by EMG testing, right foot drop (neurologist is Dr. Luberta Robertson with [facility] in [place])--neurologist referred him to neurosurgery  Migraine    "used to have them all the time; none for years" (01/04/2017)   Myocardial infarction [facility]) ?1970s   not entirely certain of this   Nephrolithiasis 07/2012   Left UVJ 2 mm stone with dilation of renal collecting system and slight hydroureter on right   Neuropathy    NICM (nonischemic cardiomyopathy) (HCC)    a. 02/2018 Cath: LM nl, LAD min irregs, LCX no, RCA 20d. [id number]. Fick CO/CI 4.4/2.0.   Osteoarthritis, multiple sites    Shoulders, back, knees   Pacemaker 02/05/2012   dual chamber, complete heart block, meddtronic revo, lasted checked 12/2015.  Since no CAD on cath 05/2016, cards recommends upgrade to CRT-D.   Permanent atrial fibrillation (HCC)    DCCV 07/09/13-converted, lasted two days, then back into afib--needs lifetime anticoagulation (Xarelto as of 09/2014)   Prostate cancer screening 09/2017   done by urol annually (normal prostate exam documented + PSA 0.84 as of 10/01/17 urol f/u.  10/2018 urol f/u PSA 0.6, no prostate nodule.   Rectus diastasis    Right ankle sprain 08/2017   w/distal fibula avulsion fx noted on u/s but not plain film-(Dr. Hudnall).   Skin cancer of arm, left    "burned it off" (01/04/2017)   TIA (transient ischemic attack)    L face and L arm weakness. Peri procedural->a. 03/22/2018 following cath. CT head neg. No MRI b/c has pacer. Likely due to embolus to distal branch of RMCA   Type II diabetes  mellitus (HCC)    Past Surgical History:  Procedure Laterality Date   ABI's Bilateral 05/21/2018   normal   BACK SURGERY     BIV ICD INSERTION CRT-D N/A 01/04/2017   Procedure: BiV ICD ;  Surgeon: Regan Lemming, MD;  Location: [facility] INVASIVE CV LAB;  Service: Cardiovascular;  Laterality: N/A;   CARDIAC CATHETERIZATION N/A 06/14/2016   Minimal nonobstructive dz, EF 25-35%.  Procedure: Left Heart Cath and Coronary Angiography;  Surgeon: Peter M Swaziland, MD;  Location: [facility] INVASIVE CV LAB;  Service: Cardiovascular;  Laterality: N/A;   CARDIOVASCULAR STRESS TEST  2012   2012 nuclear perfusion study: low risk scan; 04/2016 normal myocardial perfusion imaging, EF 32%.   CARDIOVERSION  07/09/2012   Procedure: CARDIOVERSION;  Surgeon: Thurmon Fair, MD;  Location: [facility] ENDOSCOPY;  Service: Cardiovascular;  Laterality: N/A;   CATARACT EXTRACTION W/ INTRAOCULAR LENS IMPLANT & ANTERIOR VITRECTOMY, BILATERAL Bilateral    CIRCUMCISION N/A 03/23/2020   Procedure: CIRCUMCISION ADULT;  Surgeon: Belva Agee, MD;  Location: [facility] ORS;  Service: Urology;  Laterality: N/A;   COLONOSCOPY W/ POLYPECTOMY  approx 2006; repeated 09/2011   Polyps on 2013 EGD as well, repeat 12/2014   COLONOSCOPY WITH PROPOFOL N/A 07/08/2021   adenoma x 1. Procedure: COLONOSCOPY WITH PROPOFOL;  Surgeon: Jeani Hawking, MD;  Location: [facility] ENDOSCOPY;  Service: Endoscopy;  Laterality: N/A;   ESOPHAGOGASTRODUODENOSCOPY  10/18/2006   Done due to chronic GERD: Normal, bx showed no barrett's esophagus (Dr. Elnoria Howard)   EYE SURGERY  01/08/2023   FLEXOR TENDON REPAIR Left 10/02/2016   Procedure: LEFT RING FINGER WOUND EXPORATION AND FLEXOR TENDON REPAIR AND NERVE REPAIR;  Surgeon: Mack Hook, MD;  Location: [facility] OR;  Service: Orthopedics;  Laterality: Left;   INSERT / REPLACE / REMOVE PACEMAKER  02/05/2012   dual chamber, sinus node dysfunction, sinus arrest, PAF, Medtronic Revo serial#-[id number] H: last checked 05/2015   LUMBAR LAMINECTOMY  Left 1976   L4-5   PACEMAKER REMOVAL  01/04/2017   PERMANENT  PACEMAKER INSERTION N/A 02/05/2012   Procedure: PERMANENT PACEMAKER INSERTION;  Surgeon: Thurmon Fair, MD; Generator Medtronic [place] model [place] serial number [id number] H Laterality: N/A;   POLYPECTOMY  07/08/2021   Procedure: POLYPECTOMY;  Surgeon: Jeani Hawking, MD;  Location: [facility] ENDOSCOPY;  Service: Endoscopy;;   RETINAL DETACHMENT SURGERY Left ~ 1999   REVERSE SHOULDER ARTHROPLASTY Left 2018   Left shoulder reverse [facility] in [place]).   RIGHT/LEFT HEART CATH AND CORONARY ANGIOGRAPHY N/A 03/22/2018   EF 30-35%, no CAD.  Procedure: RIGHT/LEFT HEART CATH AND CORONARY ANGIOGRAPHY;  Surgeon: Dolores Patty, MD;  Location: [facility] INVASIVE CV LAB;  Service: Cardiovascular;  Laterality: N/A;   TRANSTHORACIC ECHOCARDIOGRAM  08/25/10; 05/2012; 03/23/16;12/2017   mild asymmetric LVH, normal systolic function, normal diastolic fxn, mild-to-mod mitral regurg, mild aortic valve sclerosis and trace AI, mild aortic root dilatation. 2014 f/u showed EF 40-45%, mod LAE, A FIB.  02/2016 EF 40%, diffuse hypokinesis, grade 2 DD. 12/2017 EF 35-40%,diffuse hypokin,grd III DD, mild MR   WISDOM TOOTH EXTRACTION  06/27/2021   EF 50-55%, mild aortic root/ascending aorta dilatation (41-42 mm).   Social History   Socioeconomic History   Marital status: Married    Spouse name: Not on file   Number of children: Not on file   Years of education: Not on file   Highest education level: 12th grade  Occupational History   Not on file  Tobacco Use   Smoking status: Never   Smokeless tobacco: Never  Vaping Use   Vaping status: Never Used  Substance and Sexual Activity   Alcohol use: Yes    Comment: occ   Drug use: No   Sexual activity: Not Currently  Other Topics Concern   Not on file  Social History Narrative   Not on file   Social Drivers of Health   Financial Resource Strain: Low Risk  (09/05/2023)   Overall Financial Resource  Strain (CARDIA)    Difficulty of Paying Living Expenses: Not very hard  Food Insecurity: No Food Insecurity (09/05/2023)   Hunger Vital Sign    Worried About Running Out of Food in the Last Year: Never true    Ran Out of Food in the Last Year: Never true  Transportation Needs: No Transportation Needs (09/05/2023)   PRAPARE - Administrator, Civil Service (Medical): No    Lack of Transportation (Non-Medical): No  Physical Activity: Inactive (09/05/2023)   Exercise Vital Sign    Days of Exercise per Week: 0 days    Minutes of Exercise per Session: 0 min  Stress: No Stress Concern Present (09/05/2023)   [employer] of Occupational Health - Occupational Stress Questionnaire    Feeling of Stress : Not at all  Social Connections: Moderately Integrated (09/05/2023)   Social Connection and Isolation Panel [NHANES]    Frequency of Communication with Friends and Family: More than three times a week    Frequency of Social Gatherings with Friends and Family: More than three times a week    Attends Religious Services: More than 4 times per year    Active Member of [employer] or Organizations: No    Attends Banker Meetings: Never    Marital Status: Married  Catering manager Violence: Not At Risk (09/05/2023)   Humiliation, Afraid, Rape, and Kick questionnaire    Fear of Current or Ex-Partner: No    Emotionally Abused: No    Physically Abused: No    Sexually Abused: No   Current Outpatient  Medications on File Prior to Visit  Medication Sig Dispense Refill   BD INSULIN SYRINGE U/F 31G X 5/16" 1 ML MISC USE DAILY AS DIRECTED 100 each 3   COMBIGAN 0.2-0.5 % ophthalmic solution Place 1 drop into both eyes at bedtime.      Continuous Blood Gluc Receiver (FREESTYLE LIBRE 2 READER) DEVI 1 each by Does not apply route daily. 1 each 3   Continuous Blood Gluc Sensor (FREESTYLE LIBRE 2 SENSOR) MISC 1 each by Does not apply route every 14 (fourteen) days. 6 each 3   cyclobenzaprine  (FLEXERIL) 5 MG tablet Take 1 tablet (5 mg total) by mouth 2 (two) times daily as needed for muscle spasms. 15 tablet 0   diclofenac Sodium (VOLTAREN) 1 % GEL Apply 4 g topically 4 (four) times daily. 100 g 1   empagliflozin (JARDIANCE) 10 MG TABS tablet Take 1 tablet (10 mg total) by mouth daily. 10 tablet 0   ENTRESTO 97-103 MG TAKE 1 TABLET TWO TIMES A DAY 180 tablet 3   eplerenone (INSPRA) 25 MG tablet TAKE 1 TABLET DAILY 90 tablet 3   gabapentin (NEURONTIN) 300 MG capsule TAKE 2 CAPSULES TWICE A DAY 360 capsule 3   insulin aspart (NOVOLOG FLEXPEN) 100 UNIT/ML FlexPen Inject 10 units before each meal 30 mL 3   insulin glargine (LANTUS SOLOSTAR) 100 UNIT/ML Solostar Pen Inject 34-36 Units into the skin daily. 30 mL 3   ketoconazole (NIZORAL) 2 % cream Apply 1 Application topically daily.     ketorolac (ACULAR) 0.5 % ophthalmic solution      lidocaine (LIDODERM) 5 % PLACE 1 PATCH ON THE SKIN DAILY. REMOVE AND DISCARD PATCH WITHIN 12 HOURS OR AS DIRECTED BY DOCTOR 90 patch 0   liraglutide (VICTOZA) 18 MG/3ML SOPN Inject 1.8 mg into the skin daily. 27 mL 2   meclizine (ANTIVERT) 25 MG tablet TAKE 1 TABLET THREE TIMES A DAY AS NEEDED FOR DIZZINESS 270 tablet 1   metoprolol succinate (TOPROL-XL) 50 MG 24 hr tablet TAKE 1 TABLET DAILY. TAKE WITH OR IMMEDIATELY FOLLLOWING A MEAL 90 tablet 0   naloxone (NARCAN) nasal spray 4 mg/0.1 mL 1 spray into nostril as needed for opioid overdose.  Repeat in 3 min if no improvement 2 each 1   omeprazole (PRILOSEC) 40 MG capsule 1 cap po bid 180 capsule 1   oxybutynin (DITROPAN-XL) 10 MG 24 hr tablet TAKE 1 TABLET AT BEDTIME 90 tablet 3   oxyCODONE-acetaminophen (PERCOCET) 10-325 MG tablet 1-2 tabs po tid prn pain 90 tablet 0   ROCKLATAN 0.02-0.005 % SOLN Apply to eye.     rOPINIRole (REQUIP) 1 MG tablet TAKE 1 TABLET DAILY 90 tablet 0   rosuvastatin (CRESTOR) 20 MG tablet TAKE 1 TABLET DAILY 90 tablet 3   Semaglutide, 1 MG/DOSE, 4 MG/3ML SOPN Inject 1 mg as  directed once a week. 9 mL 3   sertraline (ZOLOFT) 100 MG tablet TAKE 1 TABLET DAILY 90 tablet 0   SURE COMFORT PEN NEEDLES 31G X 5 MM MISC USE TO INJECT INSULIN UNDER THE SKIN 100 each 11   XARELTO 20 MG TABS tablet TAKE 1 TABLET DAILY WITH SUPPER 90 tablet 3   No current facility-administered medications on file prior to visit.   No Known Allergies Family History  Problem Relation Age of Onset   Heart failure Mother    Stroke Mother    Stroke Father    Heart disease Sister    Heart disease Sister  Cancer Sister        liver   Cancer Brother        lung   Cancer Brother        lung   Heart disease Brother    PE: BP 106/64   Pulse 82   Ht 5\' 11"  (1.803 m)   Wt 218 lb (98.9 kg)   SpO2 96%   BMI 30.40 kg/m  Wt Readings from Last 10 Encounters:  10/11/23 218 lb (98.9 kg)  06/11/23 223 lb (101.2 kg)  04/12/23 230 lb (104.3 kg)  04/06/23 221 lb 12.8 oz (100.6 kg)  03/28/23 227 lb (103 kg)  03/20/23 226 lb 9.6 oz (102.8 kg)  02/08/23 230 lb 9.6 oz (104.6 kg)  01/15/23 229 lb 9.6 oz (104.1 kg)  12/27/22 229 lb (103.9 kg)  12/04/22 233 lb 12.8 oz (106.1 kg)   Constitutional: overweight, in NAD Eyes: EOMI, no exophthalmos ENT: no thyromegaly, no cervical lymphadenopathy Cardiovascular: RRR, No MRG Respiratory: CTA B Musculoskeletal: no deformities Skin: no rashes Neurological: + tremor with outstretched hands  ASSESSMENT: 1. DM2, insulin-dependent, uncontrolled, with complications: - CAD with ?h/o AMI - nonischemic CMP - CHF, s/p AICD - Afib, s/p pacemaker - cerebrovascular disease with h/o TIA - CKD stage III - peripheral neuropathy - ED  No family history of medullary thyroid cancer or personal history of pancreatitis.  2. HL  PLAN:  1. Patient with longstanding, previously uncontrolled type 2 diabetes, on SGLT2 inhibitor, GLP-1 receptor agonist and basal-bolus insulin, with improving control at last visit.  HbA1c at that time was lower, at 6.6%,  decreased from 7.1% and then 7.5%. He saw better diabetes control after switching from Trulicity to Ozempic so we decreased his insulin dose at last visit. CGM interpretation: -At today's visit, we reviewed his CGM downloads: It appears that 71% of values are in target range (goal >70%), while 29 are higher than 180 (goal <25%), and 0% are lower than 70 (goal <4%).  The calculated average blood sugar is 171.  The projected HbA1c for the next 3 months (GMI) is 7.4%. -Reviewing the CGM trends, it sugars appear to be slightly higher than before, fluctuating around the upper limit of the target range.  He had a very high blood sugar after eating ice cream 10 days ago, but sugars improved significantly in the last week despite the fact that he just ran out of Ozempic.  At today's visit, we discussed about restarting this.  He was able to back off his Lantus but uses slightly higher doses of NovoLog.  We will continue these.  He has bothersome dry mouth and he wonders whether this could be from [place].  We discussed that this could be the case.  He is trying to stay well-hydrated but this does not help.  He also has increased cravings.  We discussed about trying to stop Jardiance for 3 to 4 weeks and see how he feels.  I am hoping that afterwards we could continue with this mostly for the cardiovascular benefits. - I suggested to:  Patient Instructions  Please hold: - Jardiance   Restart: - Ozempic 1 mg weekly  Continue: - Lantus 32 units at bedtime - NovoLog 15 min before meals: 10-12 units before brunch 8-10 units before dinner If you have to take the suppertime insulin after the meal, take only up to 5 units.  Please return in 3-4 months..   - we checked his HbA1c: 7.1% (higher) - advised to check sugars  at different times of the day - 4x a day, rotating check times - advised for yearly eye exams >> he is UTD - he has twice a year foot exam at the [place].  He is on Neurontin and ropinirole.  I  previously suggested alpha lipoic acid but he did not start this. - return to clinic in 4 months  2. HL - Reviewed latest lipid panel from 03/2023: All fractions at goal: Lab Results  Component Value Date   CHOL 116 04/06/2023   HDL 42.20 04/06/2023   LDLCALC 45 04/06/2023   LDLDIRECT 103.0 06/22/2020   TRIG 145.0 04/06/2023   CHOLHDL 3 04/06/2023  - Continues Crestor 20 mg daily without side effects.  Carlus Pavlov, MD PhD [facility]

## 2023-10-12 ENCOUNTER — Encounter (HOSPITAL_COMMUNITY): Payer: Self-pay | Admitting: Gastroenterology

## 2023-10-18 ENCOUNTER — Other Ambulatory Visit: Payer: Self-pay | Admitting: Family Medicine

## 2023-10-18 DIAGNOSIS — M5414 Radiculopathy, thoracic region: Secondary | ICD-10-CM

## 2023-10-18 NOTE — Anesthesia Preprocedure Evaluation (Signed)
 Anesthesia Evaluation  Patient identified by MRN, date of birth, ID band Patient awake    Reviewed: Allergy & Precautions, NPO status , Patient's Chart, lab work & pertinent test results  History of Anesthesia Complications Negative for: history of anesthetic complications  Airway Mallampati: III  TM Distance: >3 FB Neck ROM: Full    Dental  (+) Dental Advisory Given   Pulmonary neg pulmonary ROS   Pulmonary exam normal breath sounds clear to auscultation       Cardiovascular hypertension, Pt. on home beta blockers and Pt. on medications (-) angina + Past MI (1970s) and +CHF  (-) Cardiac Stents and (-) CABG + dysrhythmias (complete heart block) Atrial Fibrillation + pacemaker + Cardiac Defibrillator  Rhythm:Regular Rate:Normal  HLD, ascending aortic aneurysm  TTE 06/29/2021: IMPRESSIONS    1. Left ventricular ejection fraction, by estimation, is 50 to 55%. Left  ventricular ejection fraction by 3D volume is 53 %. The left ventricle has  low normal function. The left ventricle has no regional wall motion  abnormalities. Left ventricular  diastolic parameters are indeterminate. The average left ventricular  global longitudinal strain is -9.1 %. The global longitudinal strain is  abnormal.   2. Right ventricular systolic function is mildly reduced. The right  ventricular size is mildly enlarged.   3. Left atrial size was severely dilated.   4. Right atrial size was severely dilated.   5. The mitral valve is normal in structure. Mild mitral valve  regurgitation.   6. The aortic valve is tricuspid. Aortic valve regurgitation is trivial.   7. Aortic dilatation noted. 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 There is mild dilatation of  the ascending aorta, measuring 41 mm.     Neuro/Psych  Headaches, neg Seizures PSYCHIATRIC DISORDERS  Depression    Vertigo  TIA (02/2018)  Neuromuscular disease (thoracic and lumbar radiculopathy, neuropathy)    GI/Hepatic Neg liver ROS,GERD  Medicated,,Dysphagia    Endo/Other  diabetes, Type 2, Insulin Dependent    Renal/GU CRFRenal disease   BPH    Musculoskeletal  (+) Arthritis , Osteoarthritis,    Abdominal   Peds  Hematology negative hematology ROS (+) Lab Results      Component                Value               Date                      WBC                      6.7                 08/08/2023                HGB                      18.0 (H)            08/08/2023                HCT                      53.0 (H)            08/08/2023                MCV  92.1                08/08/2023                PLT                      123 (L)             08/08/2023              Anesthesia Other Findings Last semaglutide: 10/11/2023  Last Xarelto: 3/24 or 25  Reproductive/Obstetrics                             Anesthesia Physical Anesthesia Plan  ASA: 3  Anesthesia Plan: MAC   Post-op Pain Management: Minimal or no pain anticipated   Induction: Intravenous  PONV Risk Score and Plan: 1 and Propofol infusion, TIVA and Treatment may vary due to age or medical condition  Airway Management Planned: Natural Airway and Nasal Cannula  Additional Equipment:   Intra-op Plan:   Post-operative Plan:   Informed Consent: I have reviewed the patients History and Physical, chart, labs and discussed the procedure including the risks, benefits and alternatives for the proposed anesthesia with the patient or authorized representative who has indicated his/her understanding and acceptance.     Dental advisory given  Plan Discussed with: CRNA and Anesthesiologist  Anesthesia Plan Comments: (Discussed with patient risks of MAC including, but not limited to, minor pain or discomfort, hearing people in the room, and possible need for backup general anesthesia. Risks for general  anesthesia also discussed including, but not limited to, sore throat, hoarse voice, chipped/damaged teeth, injury to vocal cords, nausea and vomiting, allergic reactions, lung infection, heart attack, stroke, and death. All questions answered. )        Anesthesia Quick Evaluation

## 2023-10-19 ENCOUNTER — Encounter (HOSPITAL_COMMUNITY): Payer: Self-pay | Admitting: Gastroenterology

## 2023-10-19 ENCOUNTER — Ambulatory Visit (HOSPITAL_COMMUNITY): Payer: Self-pay | Admitting: Anesthesiology

## 2023-10-19 ENCOUNTER — Encounter (HOSPITAL_COMMUNITY)
Admission: RE | Disposition: A | Discharge status: Home / Self Care | Payer: Self-pay | Source: Home / Self Care | Attending: Gastroenterology

## 2023-10-19 ENCOUNTER — Ambulatory Visit (HOSPITAL_COMMUNITY)
Admission: RE | Admit: 2023-10-19 | Discharge: 2023-10-19 | Disposition: A | Discharge status: Home / Self Care | Attending: Gastroenterology | Admitting: Gastroenterology

## 2023-10-19 ENCOUNTER — Other Ambulatory Visit: Payer: Self-pay

## 2023-10-19 DIAGNOSIS — E1122 Type 2 diabetes mellitus with diabetic chronic kidney disease: Secondary | ICD-10-CM | POA: Insufficient documentation

## 2023-10-19 DIAGNOSIS — R1314 Dysphagia, pharyngoesophageal phase: Secondary | ICD-10-CM | POA: Diagnosis not present

## 2023-10-19 DIAGNOSIS — I4821 Permanent atrial fibrillation: Secondary | ICD-10-CM | POA: Diagnosis not present

## 2023-10-19 DIAGNOSIS — Z95810 Presence of automatic (implantable) cardiac defibrillator: Secondary | ICD-10-CM | POA: Insufficient documentation

## 2023-10-19 DIAGNOSIS — I4891 Unspecified atrial fibrillation: Secondary | ICD-10-CM

## 2023-10-19 DIAGNOSIS — I5042 Chronic combined systolic (congestive) and diastolic (congestive) heart failure: Secondary | ICD-10-CM | POA: Insufficient documentation

## 2023-10-19 DIAGNOSIS — I509 Heart failure, unspecified: Secondary | ICD-10-CM

## 2023-10-19 DIAGNOSIS — I130 Hypertensive heart and chronic kidney disease with heart failure and stage 1 through stage 4 chronic kidney disease, or unspecified chronic kidney disease: Secondary | ICD-10-CM | POA: Insufficient documentation

## 2023-10-19 DIAGNOSIS — K219 Gastro-esophageal reflux disease without esophagitis: Secondary | ICD-10-CM | POA: Diagnosis not present

## 2023-10-19 DIAGNOSIS — Z7985 Long-term (current) use of injectable non-insulin antidiabetic drugs: Secondary | ICD-10-CM | POA: Insufficient documentation

## 2023-10-19 DIAGNOSIS — R1310 Dysphagia, unspecified: Secondary | ICD-10-CM

## 2023-10-19 DIAGNOSIS — N189 Chronic kidney disease, unspecified: Secondary | ICD-10-CM

## 2023-10-19 DIAGNOSIS — I5022 Chronic systolic (congestive) heart failure: Secondary | ICD-10-CM | POA: Diagnosis not present

## 2023-10-19 DIAGNOSIS — I442 Atrioventricular block, complete: Secondary | ICD-10-CM | POA: Insufficient documentation

## 2023-10-19 DIAGNOSIS — Z794 Long term (current) use of insulin: Secondary | ICD-10-CM | POA: Insufficient documentation

## 2023-10-19 DIAGNOSIS — N183 Chronic kidney disease, stage 3 unspecified: Secondary | ICD-10-CM | POA: Insufficient documentation

## 2023-10-19 HISTORY — PX: SAVORY DILATION: SHX5439

## 2023-10-19 HISTORY — PX: ESOPHAGEAL DILATION: SHX303

## 2023-10-19 LAB — GLUCOSE, CAPILLARY
Glucose-Capillary: 87 mg/dL (ref 70–99)
Glucose-Capillary: 108 mg/dL — ABNORMAL HIGH (ref 70–99)

## 2023-10-19 SURGERY — EGD, WITH DILATION USING SAVARY-GILLIARD DILATOR OVER GUIDEWIRE
Anesthesia: Monitor Anesthesia Care

## 2023-10-19 MED ORDER — PROPOFOL 500 MG/50ML IV EMUL
INTRAVENOUS | Status: DC | PRN
  Administered 2023-10-19: 125 ug/kg/min via INTRAVENOUS

## 2023-10-19 MED ORDER — SODIUM CHLORIDE 0.9 % IV SOLN: INTRAVENOUS | Status: DC | PRN

## 2023-10-19 MED ORDER — PROPOFOL 10 MG/ML IV BOLUS
INTRAVENOUS | Status: DC | PRN
  Administered 2023-10-19: 30 mg via INTRAVENOUS

## 2023-10-19 MED ORDER — PROPOFOL 500 MG/50ML IV EMUL
INTRAVENOUS | Status: AC
Start: 2023-10-19 — End: ?
  Filled 2023-10-19: qty 50

## 2023-10-19 NOTE — Op Note (Addendum)
 [facility] Patient Name: Mason [name] Procedure Date: 10/19/2023 MRN: [id number] Attending MD: Alvis Jourdain , MD, [id number] Date of Birth: 05-08-1941 CSN: [id number] Age: 83 Admit Type: Outpatient Procedure:                Upper GI endoscopy Indications:              Dysphagia Providers:                Alvis Jourdain, MD, Jacquelyn "Jaci" Bernetta Brilliant, RN, Gabino Joe, Technician Referring MD:              Medicines:                Propofol  per Anesthesia Complications:            No immediate complications. Estimated Blood Loss:     Estimated blood loss: none. Procedure:                Pre-Anesthesia Assessment:                           - Prior to the procedure, a History and Physical                            was performed, and patient medications and                            allergies were reviewed. The patient's tolerance of                            previous anesthesia was also reviewed. The risks                            and benefits of the procedure and the sedation                            options and risks were discussed with the patient.                            All questions were answered, and informed consent                            was obtained. Prior Anticoagulants: The patient has                            taken no anticoagulant or antiplatelet agents. ASA                            Grade Assessment: III - A patient with severe                            systemic disease. After reviewing the risks and  benefits, the patient was deemed in satisfactory                            condition to undergo the procedure.                           - Sedation was administered by an anesthesia                            professional. Deep sedation was attained.                           After obtaining informed consent, the endoscope was                            passed under direct vision. Throughout  the                            procedure, the patient's blood pressure, pulse, and                            oxygen saturations were monitored continuously. The                            [id number]-[id number] ([id number]) Olympus endoscope was introduced                            through the mouth, and advanced to the second part                            of duodenum. The upper GI endoscopy was                            accomplished without difficulty. The patient                            tolerated the procedure well. Scope In: Scope Out: Findings:      No endoscopic abnormality was evident in the esophagus to explain the       patient's complaint of dysphagia. It was decided, however, to proceed       with dilation of the entire esophagus. A guidewire was placed and the       scope was withdrawn. Dilation was performed with a Savary dilator with       no resistance at 18 mm. The dilation site was examined following       endoscope reinsertion and showed no change.      The stomach was normal.      The examined duodenum was normal. Impression:               - No endoscopic esophageal abnormality to explain                            patient's dysphagia. Esophagus dilated. Dilated.                           -  Normal stomach.                           - Normal examined duodenum.                           - No specimens collected. Moderate Sedation:      Not Applicable - Patient had care per Anesthesia. Recommendation:           - Patient has a contact number available for                            emergencies. The signs and symptoms of potential                            delayed complications were discussed with the                            patient. Return to normal activities tomorrow.                            Written discharge instructions were provided to the                            patient.                           - Resume previous diet.                           - Continue  present medications.                           - Return to GI clinic in 4 weeks. Procedure Code(s):        --- Professional ---                           [phone], Esophagogastroduodenoscopy, flexible,                            transoral; with insertion of guide wire followed by                            passage of dilator(s) through esophagus over guide                            wire Diagnosis Code(s):        --- Professional ---                           R13.10, Dysphagia, unspecified CPT copyright 2022 American Medical Association. All rights reserved. The codes documented in this report are preliminary and upon coder review may  be revised to meet current compliance requirements. Alvis Jourdain, MD Alvis Jourdain, MD 10/19/2023 11:23:45 AM This report has been signed electronically. Number of Addenda: 0

## 2023-10-19 NOTE — H&P (Signed)
 [name] HPI: Two weeks ago he presented to [facility] for complaints of epigastric pain/periumbilical pain.  He was treated for GERD and he responded to the medication.  Since that visit his GERD burning sensation resolved, but he does have some dull upper abdominal discomfort.  The aching is not constant and it only manifests with certain physical movement/positions.  For the past year he reports a mild upper esophageal dysphagia.  He states that something feels as if its "[place] Asher in there".  It is not severe, but he thinks it was worsening.  Since the ER visit, there was no change to the dysphagia complaint.  His last EGD for GERD was 10/18/2006 for complaints of GERD.  Biopsies for the irregular Z-line were negative for any Barrett's esophagus.  The patient currently takes dulaglutide, oxybutynin, and gabapentin for various reasons.  In 2022 he was scheduled for an EGD, but he cancelled as he reported feeling better.  During his last visit he was well with omeprazole and sucralfate.  Past Medical History:  Diagnosis Date   AICD (automatic cardioverter/defibrillator) present 2018   MDT CRT-D.  Fatigue-->completely pacer dependent.  Pacer settings adjusted 12/2017 to allow more chronotrophc variance with ADL's//exertion.   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   02/2016 EF 40%, diffuse hypokinesis, grade 2 DD.  Myoc perf imaging showed EF 32% 04/2016.  Pt upgraded to CRT-D 01/04/17.   Chronic renal insufficiency, stage III (moderate) (HCC) 2015   CrCl about 60 ml/min   Complete heart block (HCC)    Has dual chamber pacer.   COVID-19 virus infection 01/05/2021   paxlovid   Depression    DOE (dyspnea  on exertion)    NYHA class II/III CHF   Dyspnea 2021   with exertion, bending over   Episodic low back pain 01/22/2013   w/intermittent radiculitis (12/2014 his neurologist referred him to pain mgmt for epidural steroid injection)   Erectile dysfunction 2019   due to zoloft--urol rx'd viagra   GERD (gastroesophageal reflux disease)    H/O tilt table evaluation 11/02/2005   negative   Helicobacter pylori gastritis 01/2016   History of adenomatous polyp of colon 10/12/2011   Dr. Elnoria Howard (3 right side of colon- tubular adenomas removed)   History of cardiovascular stress test 05/28/2012   no ischemia, EF 37%, imaging results are unchanged and within normal variance   History of chronic prostatitis    History of kidney stones    History of vertigo    + Hx of posterior HA's.  Neuro (Dr. Sandria Manly) eval 2011.  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   Hyperlipidemia    Hypertension    Lumbar spondylosis    lumbosacral radiculopathy at L4 by EMG testing, right foot drop (neurologist is Dr. Luberta Robertson with [facility] in [place])--neurologist referred him to neurosurgery   Migraine    "used to have them all the time; none for years" (01/04/2017)   Myocardial infarction [facility]) ?1970s   not entirely certain of this   Nephrolithiasis 07/2012   Left UVJ 2 mm stone with dilation of renal collecting system and slight hydroureter on right  Neuropathy    NICM (nonischemic cardiomyopathy) (HCC)    a. 02/2018 Cath: LM nl, LAD min irregs, LCX no, RCA 20d. [id number]. Fick CO/CI 4.4/2.0.   Osteoarthritis, multiple sites    Shoulders, back, knees   Pacemaker 02/05/2012   dual chamber, complete heart block, meddtronic revo, lasted checked 12/2015.  Since no CAD on cath 05/2016, cards recommends upgrade to CRT-D.   Permanent atrial fibrillation (HCC)    DCCV 07/09/13-converted, lasted two days, then back into afib--needs lifetime  anticoagulation (Xarelto as of 09/2014)   Prostate cancer screening 09/2017   done by urol annually (normal prostate exam documented + PSA 0.84 as of 10/01/17 urol f/u.  10/2018 urol f/u PSA 0.6, no prostate nodule.   Rectus diastasis    Right ankle sprain 08/2017   w/distal fibula avulsion fx noted on u/s but not plain film-(Dr. Hudnall).   Skin cancer of arm, left    "burned it off" (01/04/2017)   TIA (transient ischemic attack)    L face and L arm weakness. Peri procedural->a. 03/22/2018 following cath. CT head neg. No MRI b/c has pacer. Likely due to embolus to distal branch of RMCA   Type II diabetes mellitus (HCC)     Past Surgical History:  Procedure Laterality Date   ABI's Bilateral 05/21/2018   normal   BACK SURGERY     BIV ICD INSERTION CRT-D N/A 01/04/2017   Procedure: BiV ICD ;  Surgeon: Regan Lemming, MD;  [facility] INVASIVE CV LAB;  Service: Cardiovascular;  Laterality: N/A;   CARDIAC CATHETERIZATION N/A 06/14/2016   Minimal nonobstructive dz, EF 25-35%.  Procedure: Left Heart Cath and Coronary Angiography;  Surgeon: Peter M Swaziland, MD;  Location: [facility] INVASIVE CV LAB;  Service: Cardiovascular;  Laterality: N/A;   CARDIOVASCULAR STRESS TEST  2012   2012 nuclear perfusion study: low risk scan; 04/2016 normal myocardial perfusion imaging, EF 32%.   CARDIOVERSION  07/09/2012   Procedure: CARDIOVERSION;  Surgeon: Thurmon Fair, MD;  Location: [facility] ENDOSCOPY;  Service: Cardiovascular;  Laterality: N/A;   CATARACT EXTRACTION W/ INTRAOCULAR LENS IMPLANT & ANTERIOR VITRECTOMY, BILATERAL Bilateral    CIRCUMCISION N/A 03/23/2020   Procedure: CIRCUMCISION ADULT;  Surgeon: Belva Agee, MD;  Location: [facility] ORS;  Service: Urology;  Laterality: N/A;   COLONOSCOPY W/ POLYPECTOMY  approx 2006; repeated 09/2011   Polyps on 2013 EGD as well, repeat 12/2014   COLONOSCOPY WITH PROPOFOL N/A 07/08/2021   adenoma x 1. Procedure: COLONOSCOPY WITH PROPOFOL;  Surgeon: Jeani Hawking, MD;   Location: [facility] ENDOSCOPY;  Service: Endoscopy;  Laterality: N/A;   ESOPHAGOGASTRODUODENOSCOPY  10/18/2006   Done due to chronic GERD: Normal, bx showed no barrett's esophagus (Dr. Elnoria Howard)   EYE SURGERY  01/08/2023   FLEXOR TENDON REPAIR Left 10/02/2016   Procedure: LEFT RING FINGER WOUND EXPORATION AND FLEXOR TENDON REPAIR AND NERVE REPAIR;  Surgeon: Mack Hook, MD;  Location: [facility] OR;  Service: Orthopedics;  Laterality: Left;   INSERT / REPLACE / REMOVE PACEMAKER  02/05/2012   dual chamber, sinus node dysfunction, sinus arrest, PAF, Medtronic Revo serial#[id number] H: last checked 05/2015   LUMBAR LAMINECTOMY Left 1976   L4-5   PACEMAKER REMOVAL  01/04/2017   PERMANENT PACEMAKER INSERTION N/A 02/05/2012   Procedure: PERMANENT PACEMAKER INSERTION;  Surgeon: Thurmon Fair, MD; Generator Medtronic [place] model [place] serial number [id number] H Laterality: N/A;   POLYPECTOMY  07/08/2021   Procedure: POLYPECTOMY;  Surgeon: Jeani Hawking, MD;  Location: [facility] ENDOSCOPY;  Service: Endoscopy;;   RETINAL DETACHMENT SURGERY  Left ~ 1999   REVERSE SHOULDER ARTHROPLASTY Left 2018   Left shoulder reverse TSA ([facility] in [place]).   RIGHT/LEFT HEART CATH AND CORONARY ANGIOGRAPHY N/A 03/22/2018   EF 30-35%, no CAD.  Procedure: RIGHT/LEFT HEART CATH AND CORONARY ANGIOGRAPHY;  Surgeon: Dolores Patty, MD;  Location: [facility] INVASIVE CV LAB;  Service: Cardiovascular;  Laterality: N/A;   TRANSTHORACIC ECHOCARDIOGRAM  08/25/10; 05/2012; 03/23/16;12/2017   mild asymmetric LVH, normal systolic function, normal diastolic fxn, mild-to-mod mitral regurg, mild aortic valve sclerosis and trace AI, mild aortic root dilatation. 2014 f/u showed EF 40-45%, mod LAE, A FIB.  02/2016 EF 40%, diffuse hypokinesis, grade 2 DD. 12/2017 EF 35-40%,diffuse hypokin,grd III DD, mild MR   WISDOM TOOTH EXTRACTION  06/27/2021   EF 50-55%, mild aortic root/ascending aorta dilatation (41-42 mm).    Family History  Problem Relation Age of  Onset   Heart failure Mother    Stroke Mother    Stroke Father    Heart disease Sister    Heart disease Sister    Cancer Sister        liver   Cancer Brother        lung   Cancer Brother        lung   Heart disease Brother     Social History:  reports that he has never smoked. He has never used smokeless tobacco. He reports current alcohol use. He reports that he does not use drugs.  Allergies: No Known Allergies  Medications: Scheduled: Continuous:  Results for orders placed or performed during the hospital encounter of 10/19/23 (from the past 24 hours)  Glucose, capillary     Status: None   Collection Time: 10/19/23  9:08 AM  Result Value Ref Range   Glucose-Capillary 87 70 - 99 mg/dL   *Note: Due to a large number of results and/or encounters for the requested time period, some results have not been displayed. A complete set of results can be found in Results Review.     No results found.  ROS:  As stated above in the HPI otherwise negative.  Blood pressure 114/77, pulse 73, temperature 98.1 F (36.7 C), temperature source Temporal, resp. rate 18, SpO2 98%.    PE: Gen: NAD, Alert and Oriented HEENT:  [place]/AT, EOMI Neck: Supple, no LAD Lungs: CTA Bilaterally CV: RRR without M/G/R ABD: Soft, NTND, +BS Ext: No C/C/E  Assessment/Plan: 1) Dysphagia - EGD with dilation.  Euline Kimbler D 10/19/2023, 9:13 AM

## 2023-10-19 NOTE — Discharge Instructions (Signed)

## 2023-10-19 NOTE — Transfer of Care (Signed)
 Immediate Anesthesia Transfer of Care Note  Patient: Mason [name]  Procedure(s) Performed: EGD, WITH DILATION USING SAVARY-GILLIARD DILATOR OVER GUIDEWIRE DILATION, ESOPHAGUS  Patient Location: Endoscopy Unit  Anesthesia Type:MAC  Level of Consciousness: awake and patient cooperative  Airway & Oxygen Therapy: Patient Spontanous Breathing and Patient connected to nasal cannula oxygen  Post-op Assessment: Report given to RN and Post -op Vital signs reviewed and stable  Post vital signs: Reviewed and stable  Last Vitals:  Vitals Value Taken Time  BP    Temp    Pulse    Resp 16 10/19/23 1016  SpO2    Vitals shown include unfiled device data.  Last Pain:  Vitals:   10/19/23 0906  TempSrc: Temporal  PainSc: 0-No pain         Complications: No notable events documented.

## 2023-10-19 NOTE — Anesthesia Postprocedure Evaluation (Signed)
 Anesthesia Post Note  Patient: Mason [name]  Procedure(s) Performed: EGD, WITH DILATION USING SAVARY-GILLIARD DILATOR OVER GUIDEWIRE DILATION, ESOPHAGUS     Patient location during evaluation: PACU Anesthesia Type: MAC Level of consciousness: awake Pain management: pain level controlled Vital Signs Assessment: post-procedure vital signs reviewed and stable Respiratory status: spontaneous breathing, nonlabored ventilation and respiratory function stable Cardiovascular status: stable and blood pressure returned to baseline Postop Assessment: no apparent nausea or vomiting Anesthetic complications: no   No notable events documented.  Last Vitals:  Vitals:   10/19/23 1030 10/19/23 1040  BP: 102/66 110/72  Pulse: 70 70  Resp: 15 16  Temp:    SpO2: 93% 94%    Last Pain:  Vitals:   10/19/23 1030  TempSrc:   PainSc: Asleep                 Linton Rump

## 2023-10-21 ENCOUNTER — Encounter (HOSPITAL_COMMUNITY): Payer: Self-pay | Admitting: Gastroenterology

## 2023-10-24 ENCOUNTER — Ambulatory Visit (INDEPENDENT_AMBULATORY_CARE_PROVIDER_SITE_OTHER): Payer: Medicare Other

## 2023-10-24 DIAGNOSIS — I442 Atrioventricular block, complete: Secondary | ICD-10-CM

## 2023-10-24 LAB — CUP PACEART REMOTE DEVICE CHECK
Battery Remaining Longevity: 10 mo
Battery Voltage: 2.86 V
Brady Statistic AP VP Percent: 0 %
Brady Statistic AP VS Percent: 0 %
Brady Statistic AS VP Percent: 0 %
Brady Statistic AS VS Percent: 0 %
Brady Statistic RA Percent Paced: 0 %
Brady Statistic RV Percent Paced: 99.96 %
Date Time Interrogation Session: 20250402031704
HighPow Impedance: 75 Ohm
Implantable Lead Connection Status: 753985
Implantable Lead Connection Status: 753985
Implantable Lead Connection Status: 753985
Implantable Lead Implant Date: 20130715
Implantable Lead Implant Date: 20180614
Implantable Lead Implant Date: 20180614
Implantable Lead Location: 753858
Implantable Lead Location: 753859
Implantable Lead Location: 753860
Implantable Lead Model: 4598
Implantable Pulse Generator Implant Date: 20180614
Lead Channel Impedance Value: 1026 Ohm
Lead Channel Impedance Value: 201.488
Lead Channel Impedance Value: 218.104
Lead Channel Impedance Value: 226.51 Ohm
Lead Channel Impedance Value: 260.571
Lead Channel Impedance Value: 289.049
Lead Channel Impedance Value: 342 Ohm
Lead Channel Impedance Value: 361 Ohm
Lead Channel Impedance Value: 418 Ohm
Lead Channel Impedance Value: 456 Ohm
Lead Channel Impedance Value: 532 Ohm
Lead Channel Impedance Value: 551 Ohm
Lead Channel Impedance Value: 608 Ohm
Lead Channel Impedance Value: 665 Ohm
Lead Channel Impedance Value: 722 Ohm
Lead Channel Impedance Value: 817 Ohm
Lead Channel Impedance Value: 874 Ohm
Lead Channel Impedance Value: 950 Ohm
Lead Channel Pacing Threshold Amplitude: 0.5 V
Lead Channel Pacing Threshold Amplitude: 0.625 V
Lead Channel Pacing Threshold Pulse Width: 0.4 ms
Lead Channel Pacing Threshold Pulse Width: 1 ms
Lead Channel Sensing Intrinsic Amplitude: 0.625 mV
Lead Channel Sensing Intrinsic Amplitude: 13.75 mV
Lead Channel Sensing Intrinsic Amplitude: 13.75 mV
Lead Channel Setting Pacing Amplitude: 1 V
Lead Channel Setting Pacing Amplitude: 2 V
Lead Channel Setting Pacing Pulse Width: 0.4 ms
Lead Channel Setting Pacing Pulse Width: 1 ms
Lead Channel Setting Sensing Sensitivity: 0.3 mV
Zone Setting Status: 755011

## 2023-10-27 ENCOUNTER — Encounter: Payer: Self-pay | Admitting: Cardiovascular Disease

## 2023-10-29 ENCOUNTER — Other Ambulatory Visit: Payer: Self-pay | Admitting: Family Medicine

## 2023-10-30 DIAGNOSIS — Z6830 Body mass index (BMI) 30.0-30.9, adult: Secondary | ICD-10-CM | POA: Diagnosis not present

## 2023-10-30 DIAGNOSIS — M5416 Radiculopathy, lumbar region: Secondary | ICD-10-CM | POA: Diagnosis not present

## 2023-11-02 NOTE — Addendum Note (Signed)
 Addended by: Pollie Meyer on: 11/02/2023 04:21 PM   Modules accepted: Orders

## 2023-11-07 DIAGNOSIS — L814 Other melanin hyperpigmentation: Secondary | ICD-10-CM | POA: Diagnosis not present

## 2023-11-07 DIAGNOSIS — R238 Other skin changes: Secondary | ICD-10-CM | POA: Diagnosis not present

## 2023-11-07 DIAGNOSIS — D225 Melanocytic nevi of trunk: Secondary | ICD-10-CM | POA: Diagnosis not present

## 2023-11-07 DIAGNOSIS — L570 Actinic keratosis: Secondary | ICD-10-CM | POA: Diagnosis not present

## 2023-11-07 DIAGNOSIS — Z7189 Other specified counseling: Secondary | ICD-10-CM | POA: Diagnosis not present

## 2023-11-07 DIAGNOSIS — L821 Other seborrheic keratosis: Secondary | ICD-10-CM | POA: Diagnosis not present

## 2023-11-07 DIAGNOSIS — L218 Other seborrheic dermatitis: Secondary | ICD-10-CM | POA: Diagnosis not present

## 2023-11-07 DIAGNOSIS — B078 Other viral warts: Secondary | ICD-10-CM | POA: Diagnosis not present

## 2023-11-07 NOTE — Progress Notes (Signed)
 Remote ICD transmission.

## 2023-11-14 DIAGNOSIS — R1310 Dysphagia, unspecified: Secondary | ICD-10-CM | POA: Diagnosis not present

## 2023-11-15 ENCOUNTER — Encounter: Payer: Self-pay | Admitting: Family Medicine

## 2023-11-15 ENCOUNTER — Ambulatory Visit (INDEPENDENT_AMBULATORY_CARE_PROVIDER_SITE_OTHER): Admitting: Family Medicine

## 2023-11-15 VITALS — BP 101/64 | HR 81 | Temp 97.7°F | Ht 71.0 in | Wt 219.8 lb

## 2023-11-15 DIAGNOSIS — K439 Ventral hernia without obstruction or gangrene: Secondary | ICD-10-CM

## 2023-11-15 DIAGNOSIS — R1010 Upper abdominal pain, unspecified: Secondary | ICD-10-CM

## 2023-11-15 DIAGNOSIS — R1031 Right lower quadrant pain: Secondary | ICD-10-CM

## 2023-11-15 NOTE — Progress Notes (Signed)
 OFFICE VISIT  11/15/2023  CC:  Chief Complaint  Patient presents with   Abdominal Pain    Across abdomen and radiating to back, groin pain x 1 month. Urgency to urinate more, denies urinary pain or burning    Patient is a 83 y.o. male who presents for abdominal concerns.  HPI: Approximately 1 month history of progressively worsening diffuse tightness located in the abdomen in general but particularly upper abdomen.  Worse with deep breaths or with certain contractions of the abdominal muscles.  Sometimes extends around the sides under the ribs.  Denies bloating, nausea or vomiting, constipation, diarrhea, or fevers. No blood in urine, no painful urination.  He has intermittent focal pain in the right inguinal region.  It is not present currently. Sometimes he feels something protruding when it hurts a lot.  The pain seems brief when it comes.  No history of abdominal surgery.  He had EGD 10/19/2023 by Dr. Nickey Barn esophageal dilatation was done  and his dysphagia resolved.   Past Medical History:  Diagnosis Date   AICD (automatic cardioverter/defibrillator) present 2018   MDT CRT-D.  Fatigue-->completely pacer dependent.  Pacer settings adjusted 12/2017 to allow more chronotrophc variance with ADL's//exertion.   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   02/2016 EF 40%, diffuse hypokinesis, grade 2 DD.  Myoc perf imaging showed EF 32% 04/2016.  Pt upgraded to CRT-D 01/04/17.   Chronic renal insufficiency, stage III (moderate) (HCC) 2015   CrCl about 60 ml/min   Complete heart block (HCC)    Has dual chamber pacer.   COVID-19 virus infection 01/05/2021   paxlovid   Depression    DOE (dyspnea on  exertion)    NYHA class II/III CHF   Dyspnea 2021   with exertion, bending over   Episodic low back pain 01/22/2013   w/intermittent radiculitis (12/2014 his neurologist referred him to pain mgmt for epidural steroid injection)   Erectile dysfunction 2019   due to zoloft --urol rx'd viagra   GERD (gastroesophageal reflux disease)    H/O tilt table evaluation 11/02/2005   negative   Helicobacter pylori gastritis 01/2016   History of adenomatous polyp of colon 10/12/2011   Dr. Nickey Barn (3 right side of colon- tubular adenomas removed)   History of cardiovascular stress test 05/28/2012   no ischemia, EF 37%, imaging results are unchanged and within normal variance   History of chronic prostatitis    History of kidney stones    History of vertigo    + Hx of posterior HA's.  Neuro (Dr. Dania Dupre) eval 2011.  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   Hyperlipidemia    Hypertension    Lumbar spondylosis    lumbosacral radiculopathy at L4 by EMG testing, right foot drop (neurologist is Dr. Langston Pippins with [facility] in [place])--neurologist referred him to neurosurgery   Migraine    "used to have them all the time; none for years" (01/04/2017)   Myocardial infarction [facility]) ?1970s   not entirely certain of this   Nephrolithiasis 07/2012   Left UVJ 2 mm stone with dilation of renal collecting system and slight hydroureter on right   Neuropathy  NICM (nonischemic cardiomyopathy) (HCC)    a. 02/2018 Cath: LM nl, LAD min irregs, LCX no, RCA 20d. [id number]. Fick CO/CI 4.4/2.0.   Osteoarthritis, multiple sites    Shoulders, back, knees   Pacemaker 02/05/2012   dual chamber, complete heart block, meddtronic revo, lasted checked 12/2015.  Since no CAD on cath 05/2016, cards recommends upgrade to CRT-D.   Permanent atrial fibrillation (HCC)    DCCV 07/09/13-converted, lasted two days, then back into afib--needs lifetime  anticoagulation (Xarelto  as of 09/2014)   Prostate cancer screening 09/2017   done by urol annually (normal prostate exam documented + PSA 0.84 as of 10/01/17 urol f/u.  10/2018 urol f/u PSA 0.6, no prostate nodule.   Rectus diastasis    Right ankle sprain 08/2017   w/distal fibula avulsion fx noted on u/s but not plain film-(Dr. Hudnall).   Skin cancer of arm, left    "burned it off" (01/04/2017)   TIA (transient ischemic attack)    L face and L arm weakness. Peri procedural->a. 03/22/2018 following cath. CT head neg. No MRI b/c has pacer. Likely due to embolus to distal branch of RMCA   Type II diabetes mellitus (HCC)     Past Surgical History:  Procedure Laterality Date   ABI's Bilateral 05/21/2018   normal   BACK SURGERY     BIV ICD INSERTION CRT-D N/A 01/04/2017   Procedure: BiV ICD ;  Surgeon: Lei Pump, MD;  Location: [facility] INVASIVE CV LAB;  Service: Cardiovascular;  Laterality: N/A;   CARDIAC CATHETERIZATION N/A 06/14/2016   Minimal nonobstructive dz, EF 25-35%.  Procedure: Left Heart Cath and Coronary Angiography;  Surgeon: Peter M Swaziland, MD;  Location: [facility] INVASIVE CV LAB;  Service: Cardiovascular;  Laterality: N/A;   CARDIOVASCULAR STRESS TEST  2012   2012 nuclear perfusion study: low risk scan; 04/2016 normal myocardial perfusion imaging, EF 32%.   CARDIOVERSION  07/09/2012   Procedure: CARDIOVERSION;  Surgeon: Luana Rumple, MD;  Location: [facility] ENDOSCOPY;  Service: Cardiovascular;  Laterality: N/A;   CATARACT EXTRACTION W/ INTRAOCULAR LENS IMPLANT & ANTERIOR VITRECTOMY, BILATERAL Bilateral    CIRCUMCISION N/A 03/23/2020   Procedure: CIRCUMCISION ADULT;  Surgeon: Sherlyn Ditto, MD;  Location: [facility] ORS;  Service: Urology;  Laterality: N/A;   COLONOSCOPY W/ POLYPECTOMY  approx 2006; repeated 09/2011   Polyps on 2013 EGD as well, repeat 12/2014   COLONOSCOPY WITH PROPOFOL  N/A 07/08/2021   adenoma x 1. Procedure: COLONOSCOPY WITH PROPOFOL ;  Surgeon: Alvis Jourdain, MD;   Location: [facility] ENDOSCOPY;  Service: Endoscopy;  Laterality: N/A;   ESOPHAGEAL DILATION  10/19/2023   Procedure: DILATION, ESOPHAGUS;  Surgeon: Alvis Jourdain, MD;  Location: [facility] ENDOSCOPY;  Service: Gastroenterology;;   ESOPHAGOGASTRODUODENOSCOPY  10/18/2006   Done due to chronic GERD: Normal, bx showed no barrett's esophagus (Dr. Nickey Barn)   EYE SURGERY  01/08/2023   FLEXOR TENDON REPAIR Left 10/02/2016   Procedure: LEFT RING FINGER WOUND EXPORATION AND FLEXOR TENDON REPAIR AND NERVE REPAIR;  Surgeon: Rober Chimera, MD;  Location: [facility] OR;  Service: Orthopedics;  Laterality: Left;   INSERT / REPLACE / REMOVE PACEMAKER  02/05/2012   dual chamber, sinus node dysfunction, sinus arrest, PAF, Medtronic Revo serial#[id number] H: last checked 05/2015   LUMBAR LAMINECTOMY Left 1976   L4-5   PACEMAKER REMOVAL  01/04/2017   PERMANENT PACEMAKER INSERTION N/A 02/05/2012   Procedure: PERMANENT PACEMAKER INSERTION;  Surgeon: Luana Rumple, MD; Generator Medtronic [place] model [place] serial number [id number] H Laterality: N/A;   POLYPECTOMY  07/08/2021  Procedure: POLYPECTOMY;  Surgeon: Alvis Jourdain, MD;  Location: [facility] ENDOSCOPY;  Service: Endoscopy;;   RETINAL DETACHMENT SURGERY Left ~ 1999   REVERSE SHOULDER ARTHROPLASTY Left 2018   Left shoulder reverse TSA ([facility] in [place]).   RIGHT/LEFT HEART CATH AND CORONARY ANGIOGRAPHY N/A 03/22/2018   EF 30-35%, no CAD.  Procedure: RIGHT/LEFT HEART CATH AND CORONARY ANGIOGRAPHY;  Surgeon: Mardell Shade, MD;  Location: [facility] INVASIVE CV LAB;  Service: Cardiovascular;  Laterality: N/A;   SAVORY DILATION N/A 10/19/2023   Procedure: EGD, WITH DILATION USING SAVARY-GILLIARD DILATOR OVER GUIDEWIRE;  Surgeon: Alvis Jourdain, MD;  Location: [facility] ENDOSCOPY;  Service: Gastroenterology;  Laterality: N/A;   TRANSTHORACIC ECHOCARDIOGRAM  08/25/10; 05/2012; 03/23/16;12/2017   mild asymmetric LVH, normal systolic function, normal diastolic fxn, mild-to-mod mitral regurg, mild aortic  valve sclerosis and trace AI, mild aortic root dilatation. 2014 f/u showed EF 40-45%, mod LAE, A FIB.  02/2016 EF 40%, diffuse hypokinesis, grade 2 DD. 12/2017 EF 35-40%,diffuse hypokin,grd III DD, mild MR   WISDOM TOOTH EXTRACTION  06/27/2021   EF 50-55%, mild aortic root/ascending aorta dilatation (41-42 mm).    Outpatient Medications Prior to Visit  Medication Sig Dispense Refill   BD INSULIN  SYRINGE U/F 31G X 5/16" 1 ML MISC USE DAILY AS DIRECTED 100 each 3   COMBIGAN  0.2-0.5 % ophthalmic solution Place 1 drop into both eyes at bedtime.      Continuous Blood Gluc Receiver (FREESTYLE LIBRE 2 READER) DEVI 1 each by Does not apply route daily. 1 each 3   Continuous Blood Gluc Sensor (FREESTYLE LIBRE 2 SENSOR) MISC 1 each by Does not apply route every 14 (fourteen) days. 6 each 3   cyclobenzaprine  (FLEXERIL ) 5 MG tablet Take 1 tablet (5 mg total) by mouth 2 (two) times daily as needed for muscle spasms. 15 tablet 0   diclofenac  Sodium (VOLTAREN ) 1 % GEL Apply 4 g topically 4 (four) times daily. 100 g 1   ENTRESTO  97-103 MG TAKE 1 TABLET TWO TIMES A DAY 180 tablet 3   eplerenone  (INSPRA ) 25 MG tablet TAKE 1 TABLET DAILY 90 tablet 3   gabapentin  (NEURONTIN ) 300 MG capsule TAKE 2 CAPSULES TWICE A DAY 360 capsule 3   insulin  aspart (NOVOLOG  FLEXPEN) 100 UNIT/ML FlexPen Inject 10 units before each meal (Patient taking differently: Inject 12 units before each meal) 30 mL 3   insulin  glargine (LANTUS  SOLOSTAR) 100 UNIT/ML Solostar Pen Inject 34-36 Units into the skin daily. (Patient taking differently: Inject 34-36 Units into the skin daily. 32 units daily) 30 mL 3   ketoconazole (NIZORAL) 2 % cream Apply 1 Application topically daily.     ketorolac  (ACULAR ) 0.5 % ophthalmic solution      lidocaine  (LIDODERM ) 5 % PLACE 1 PATCH ON THE SKIN DAILY. REMOVE AND DISCARD PATCH WITHIN 12 HOURS OR AS DIRECTED BY DOCTOR 90 patch 0   meclizine  (ANTIVERT ) 25 MG tablet TAKE 1 TABLET THREE TIMES A DAY AS NEEDED FOR  DIZZINESS 270 tablet 1   metoprolol  succinate (TOPROL -XL) 50 MG 24 hr tablet TAKE 1 TABLET DAILY. TAKE WITH OR IMMEDIATELY FOLLLOWING A MEAL 90 tablet 0   omeprazole  (PRILOSEC) 40 MG capsule 1 cap po bid 180 capsule 1   oxybutynin  (DITROPAN -XL) 10 MG 24 hr tablet TAKE 1 TABLET AT BEDTIME 90 tablet 3   oxyCODONE -acetaminophen  (PERCOCET) 10-325 MG tablet 1-2 tabs po tid prn pain 90 tablet 0   ROCKLATAN 0.02-0.005 % SOLN Apply to eye.     rOPINIRole  (  REQUIP ) 1 MG tablet TAKE 1 TABLET DAILY 90 tablet 0   rosuvastatin  (CRESTOR ) 20 MG tablet TAKE 1 TABLET DAILY 90 tablet 3   Semaglutide , 1 MG/DOSE, 4 MG/3ML SOPN Inject 1 mg as directed once a week. 9 mL 3   sertraline  (ZOLOFT ) 100 MG tablet TAKE 1 TABLET DAILY 90 tablet 0   SURE COMFORT PEN NEEDLES 31G X 5 MM MISC USE TO INJECT INSULIN  UNDER THE SKIN 100 each 11   XARELTO  20 MG TABS tablet TAKE 1 TABLET DAILY WITH SUPPER 90 tablet 3   naloxone  (NARCAN ) nasal spray 4 mg/0.1 mL 1 spray into nostril as needed for opioid overdose.  Repeat in 3 min if no improvement (Patient not taking: Reported on 11/15/2023) 2 each 1   No facility-administered medications prior to visit.    No Known Allergies  Review of Systems  As per HPI  PE:    11/15/2023    1:22 PM 10/19/2023   10:40 AM 10/19/2023   10:30 AM  Vitals with BMI  Height 5\' 11"     Weight 219 lbs 13 oz    BMI 30.67    Systolic 101 110 409  Diastolic 64 72 66  Pulse 81 70 70     Physical Exam  Gen: Alert, well appearing.  Patient is oriented to person, place, time, and situation. AFFECT: pleasant, lucid thought and speech. ABD: soft, NT, ND.  He has mild protrusion in the midline above the umbilicus level when he does abdominal crunch. This area is tender to palpation during that time. BS normal.  No hepatospenomegaly or mass.  No bruits. No groin or inguinal bulge or tenderness.  Testicles normal.  LABS:  Last CBC Lab Results  Component Value Date   WBC 6.7 08/08/2023   HGB 18.0  (H) 08/08/2023   HCT 53.0 (H) 08/08/2023   MCV 92.1 08/08/2023   MCH 30.4 08/08/2023   RDW 12.8 08/08/2023   PLT 123 (L) 08/08/2023   Last metabolic panel Lab Results  Component Value Date   GLUCOSE 157 (H) 08/08/2023   NA 136 08/08/2023   K 6.0 (H) 08/08/2023   CL 102 08/08/2023   CO2 22 08/08/2023   BUN 32 (H) 08/08/2023   CREATININE 1.40 (H) 08/08/2023   GFRNONAA >60 08/08/2023   CALCIUM  7.1 (L) 08/08/2023   PROT 7.3 04/06/2023   ALBUMIN 4.0 04/06/2023   BILITOT 0.6 04/06/2023   ALKPHOS 78 04/06/2023   AST 28 04/06/2023   ALT 24 04/06/2023   ANIONGAP 7 08/08/2023   Last lipids Lab Results  Component Value Date   CHOL 116 04/06/2023   HDL 42.20 04/06/2023   LDLCALC 45 04/06/2023   LDLDIRECT 103.0 06/22/2020   TRIG 145.0 04/06/2023   CHOLHDL 3 04/06/2023   Last hemoglobin A1c Lab Results  Component Value Date   HGBA1C 7.1 (A) 10/11/2023   Last thyroid  functions Lab Results  Component Value Date   TSH 2.963 04/04/2018   IMPRESSION AND PLAN:  #1 upper abdominal discomfort. Ventral hernia versus painful diastases rectus. Will ask general surgeon to assess him. I will hold off on any imaging at this time.  2.  Groin pain.  He does describe intermittent feeling of protruding inguinal hernia.  His exam today is normal, though. He will monitor.  Signs/symptoms to call or return for were reviewed and pt expressed understanding.  An After Visit Summary was printed and given to the patient.  FOLLOW UP: Return if symptoms worsen or fail to  improve.  Signed:  Arletha Lady, MD           11/15/2023

## 2023-11-19 ENCOUNTER — Other Ambulatory Visit: Payer: Self-pay | Admitting: Family Medicine

## 2023-11-23 DIAGNOSIS — M6208 Separation of muscle (nontraumatic), other site: Secondary | ICD-10-CM | POA: Diagnosis not present

## 2023-11-28 ENCOUNTER — Other Ambulatory Visit: Payer: Self-pay | Admitting: Surgery

## 2023-11-28 DIAGNOSIS — I7121 Aneurysm of the ascending aorta, without rupture: Secondary | ICD-10-CM

## 2023-12-10 ENCOUNTER — Other Ambulatory Visit: Payer: Self-pay | Admitting: Family Medicine

## 2023-12-10 NOTE — Telephone Encounter (Signed)
 Copied from CRM [id number]. Topic: Clinical - Medication Refill >> Dec 10, 2023  3:50 PM [place] E wrote: Medication: rOPINIRole  (REQUIP ) 1 MG tablet  Has the patient contacted their pharmacy? No (Agent: If no, request that the patient contact the pharmacy for the refill. If patient does not wish to contact the pharmacy document the reason why and proceed with request.) (Agent: If yes, when and what did the pharmacy advise?)  This is the patient's preferred pharmacy:   [facility] - [place], [place] - [address] [place] [address] Phone: [phone] Fax: [phone]   Is this the correct pharmacy for this prescription? Yes If no, delete pharmacy and type the correct one.   Has the prescription been filled recently? No  Is the patient out of the medication? No, 3 days left.  Has the patient been seen for an appointment in the last year OR does the patient have an upcoming appointment? Yes  Can we respond through MyChart? Yes  Agent: Please be advised that Rx refills may take up to 3 business days. We ask that you follow-up with your pharmacy.

## 2023-12-11 MED ORDER — ROPINIROLE HCL 1 MG PO TABS: 1.0000 mg | ORAL_TABLET | Freq: Every day | ORAL | 0 refills | Status: DC

## 2023-12-19 DIAGNOSIS — H43811 Vitreous degeneration, right eye: Secondary | ICD-10-CM | POA: Diagnosis not present

## 2023-12-19 DIAGNOSIS — H401131 Primary open-angle glaucoma, bilateral, mild stage: Secondary | ICD-10-CM | POA: Diagnosis not present

## 2023-12-19 DIAGNOSIS — H43812 Vitreous degeneration, left eye: Secondary | ICD-10-CM | POA: Diagnosis not present

## 2023-12-19 DIAGNOSIS — H35372 Puckering of macula, left eye: Secondary | ICD-10-CM | POA: Diagnosis not present

## 2023-12-19 DIAGNOSIS — H353132 Nonexudative age-related macular degeneration, bilateral, intermediate dry stage: Secondary | ICD-10-CM | POA: Diagnosis not present

## 2023-12-25 ENCOUNTER — Ambulatory Visit (HOSPITAL_COMMUNITY)
Admission: RE | Admit: 2023-12-25 | Discharge: 2023-12-25 | Disposition: A | Discharge status: Home / Self Care | Source: Ambulatory Visit | Attending: Surgery | Admitting: Surgery

## 2023-12-25 DIAGNOSIS — I7121 Aneurysm of the ascending aorta, without rupture: Secondary | ICD-10-CM | POA: Insufficient documentation

## 2023-12-25 DIAGNOSIS — I2510 Atherosclerotic heart disease of native coronary artery without angina pectoris: Secondary | ICD-10-CM | POA: Diagnosis not present

## 2023-12-25 MED ORDER — IOHEXOL 350 MG/ML SOLN
75.0000 mL | Freq: Once | INTRAVENOUS | Status: AC | PRN
  Administered 2023-12-25: 75 mL via INTRAVENOUS

## 2023-12-28 ENCOUNTER — Encounter: Payer: Self-pay | Admitting: Family Medicine

## 2023-12-28 ENCOUNTER — Ambulatory Visit: Admitting: Family Medicine

## 2023-12-28 VITALS — BP 97/64 | HR 89 | Ht 71.0 in | Wt 219.6 lb

## 2023-12-28 DIAGNOSIS — M6208 Separation of muscle (nontraumatic), other site: Secondary | ICD-10-CM

## 2023-12-28 DIAGNOSIS — R1031 Right lower quadrant pain: Secondary | ICD-10-CM | POA: Diagnosis not present

## 2023-12-28 NOTE — Progress Notes (Signed)
 OFFICE VISIT  12/28/2023  CC:  Chief Complaint  Patient presents with   Leg Pain    right; describes pain as dull    Patient is a 83 y.o. male who presents for right groin pain.  HPI: Approximately 6 to 8 months ago Mason [name] began to feel pain in the right inguinal region that started when he gets up and walks.  Sometimes very severe ache, has to sit and rest and it will go away. No preceding trauma or change in activity.  No swelling in the groin has been noted.  No testicle pain.  He does have ongoing thoracic back pain with radiculopathy that he is seeing a neurosurgeon for.  He says thoracic fixation surgery has been proposed as an option. He also has persistent discomfort in the upper abdomen under the ribs when he does anything that increases intra-abdominal pressure, particularly coughing. I referred him to general surgery back in April for concern of ventral hernia versus painful diastases rectus. Patient reports that the surgeon told him that it was not a hernia.  Review of systems: No nausea or vomiting, no abdominal pain.  He does have chronic low back pain but his right groin pain does not radiate around from the back or the buttock region.  Past Medical History:  Diagnosis Date   AICD (automatic cardioverter/defibrillator) present 2018   MDT CRT-D.  Fatigue-->completely pacer dependent.  Pacer settings adjusted 12/2017 to allow more chronotrophc variance with ADL's//exertion.   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   02/2016 EF 40%, diffuse hypokinesis, grade 2 DD.  Myoc perf imaging showed EF 32% 04/2016.  Pt upgraded to CRT-D 01/04/17.   Chronic renal insufficiency, stage III (moderate)  (HCC) 2015   CrCl about 60 ml/min   Complete heart block (HCC)    Has dual chamber pacer.   COVID-19 virus infection 01/05/2021   paxlovid   Depression    DOE (dyspnea on exertion)    NYHA class II/III CHF   Dyspnea 2021   with exertion, bending over   Episodic low back pain 01/22/2013   w/intermittent radiculitis (12/2014 his neurologist referred him to pain mgmt for epidural steroid injection)   Erectile dysfunction 2019   due to zoloft --urol rx'd viagra   GERD (gastroesophageal reflux disease)    H/O tilt table evaluation 11/02/2005   negative   Helicobacter pylori gastritis 01/2016   History of adenomatous polyp of colon 10/12/2011   Dr. Nickey Barn (3 right side of colon- tubular adenomas removed)   History of cardiovascular stress test 05/28/2012   no ischemia, EF 37%, imaging results are unchanged and within normal variance   History of chronic prostatitis    History of kidney stones    History of vertigo    + Hx of posterior HA's.  Neuro (Dr. Dania Dupre) eval 2011.  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   Hyperlipidemia    Hypertension    Lumbar spondylosis    lumbosacral radiculopathy at L4 by EMG testing, right foot drop (neurologist is Dr. Langston Pippins with [facility] in [place])--neurologist referred him to neurosurgery   Migraine    "used to have them all the  time; none for years" (01/04/2017)   Myocardial infarction [facility]) ?1970s   not entirely certain of this   Nephrolithiasis 07/2012   Left UVJ 2 mm stone with dilation of renal collecting system and slight hydroureter on right   Neuropathy    NICM (nonischemic cardiomyopathy) (HCC)    a. 02/2018 Cath: LM nl, LAD min irregs, LCX no, RCA 20d. [id number]. Fick CO/CI 4.4/2.0.   Osteoarthritis, multiple sites    Shoulders, back, knees   Pacemaker 02/05/2012   dual chamber, complete heart block, meddtronic revo, lasted checked 12/2015.  Since no CAD  on cath 05/2016, cards recommends upgrade to CRT-D.   Permanent atrial fibrillation (HCC)    DCCV 07/09/13-converted, lasted two days, then back into afib--needs lifetime anticoagulation (Xarelto  as of 09/2014)   Prostate cancer screening 09/2017   done by urol annually (normal prostate exam documented + PSA 0.84 as of 10/01/17 urol f/u.  10/2018 urol f/u PSA 0.6, no prostate nodule.   Rectus diastasis    Right ankle sprain 08/2017   w/distal fibula avulsion fx noted on u/s but not plain film-(Dr. Hudnall).   Skin cancer of arm, left    "burned it off" (01/04/2017)   TIA (transient ischemic attack)    L face and L arm weakness. Peri procedural->a. 03/22/2018 following cath. CT head neg. No MRI b/c has pacer. Likely due to embolus to distal branch of RMCA   Type II diabetes mellitus (HCC)     Past Surgical History:  Procedure Laterality Date   ABI's Bilateral 05/21/2018   normal   BACK SURGERY     BIV ICD INSERTION CRT-D N/A 01/04/2017   Procedure: BiV ICD ;  Surgeon: Lei Pump, MD;  Location: [facility] INVASIVE CV LAB;  Service: Cardiovascular;  Laterality: N/A;   CARDIAC CATHETERIZATION N/A 06/14/2016   Minimal nonobstructive dz, EF 25-35%.  Procedure: Left Heart Cath and Coronary Angiography;  Surgeon: Peter M Swaziland, MD;  Location: [facility] INVASIVE CV LAB;  Service: Cardiovascular;  Laterality: N/A;   CARDIOVASCULAR STRESS TEST  2012   2012 nuclear perfusion study: low risk scan; 04/2016 normal myocardial perfusion imaging, EF 32%.   CARDIOVERSION  07/09/2012   Procedure: CARDIOVERSION;  Surgeon: Luana Rumple, MD;  Location: [facility] ENDOSCOPY;  Service: Cardiovascular;  Laterality: N/A;   CATARACT EXTRACTION W/ INTRAOCULAR LENS IMPLANT & ANTERIOR VITRECTOMY, BILATERAL Bilateral    CIRCUMCISION N/A 03/23/2020   Procedure: CIRCUMCISION ADULT;  Surgeon: Sherlyn Ditto, MD;  Location: [facility] ORS;  Service: Urology;  Laterality: N/A;   COLONOSCOPY W/ POLYPECTOMY  approx 2006; repeated 09/2011    Polyps on 2013 EGD as well, repeat 12/2014   COLONOSCOPY WITH PROPOFOL  N/A 07/08/2021   adenoma x 1. Procedure: COLONOSCOPY WITH PROPOFOL ;  Surgeon: Alvis Jourdain, MD;  Location: [facility] ENDOSCOPY;  Service: Endoscopy;  Laterality: N/A;   ESOPHAGEAL DILATION  10/19/2023   Procedure: DILATION, ESOPHAGUS;  Surgeon: Alvis Jourdain, MD;  Location: [facility] ENDOSCOPY;  Service: Gastroenterology;;   ESOPHAGOGASTRODUODENOSCOPY  10/18/2006   Done due to chronic GERD: Normal, bx showed no barrett's esophagus (Dr. Nickey Barn)   EYE SURGERY  01/08/2023   FLEXOR TENDON REPAIR Left 10/02/2016   Procedure: LEFT RING FINGER WOUND EXPORATION AND FLEXOR TENDON REPAIR AND NERVE REPAIR;  Surgeon: Rober Chimera, MD;  Location: [facility] OR;  Service: Orthopedics;  Laterality: Left;   INSERT / REPLACE / REMOVE PACEMAKER  02/05/2012   dual chamber, sinus node dysfunction, sinus arrest, PAF, Medtronic Revo serial#[id number] H: last checked 05/2015   LUMBAR LAMINECTOMY  Left 1976   L4-5   PACEMAKER REMOVAL  01/04/2017   PERMANENT PACEMAKER INSERTION N/A 02/05/2012   Procedure: PERMANENT PACEMAKER INSERTION;  Surgeon: Luana Rumple, MD; Generator Medtronic Adams model [place] serial number [id number] H Laterality: N/A;   POLYPECTOMY  07/08/2021   Procedure: POLYPECTOMY;  Surgeon: Alvis Jourdain, MD;  Location: [facility] ENDOSCOPY;  Service: Endoscopy;;   RETINAL DETACHMENT SURGERY Left ~ 1999   REVERSE SHOULDER ARTHROPLASTY Left 2018   Left shoulder reverse [facility] in [place]).   RIGHT/LEFT HEART CATH AND CORONARY ANGIOGRAPHY N/A 03/22/2018   EF 30-35%, no CAD.  Procedure: RIGHT/LEFT HEART CATH AND CORONARY ANGIOGRAPHY;  Surgeon: Mardell Shade, MD;  Location: [facility] INVASIVE CV LAB;  Service: Cardiovascular;  Laterality: N/A;   SAVORY DILATION N/A 10/19/2023   Procedure: EGD, WITH DILATION USING SAVARY-GILLIARD DILATOR OVER GUIDEWIRE;  Surgeon: Alvis Jourdain, MD;  Location: [facility] ENDOSCOPY;  Service: Gastroenterology;  Laterality: N/A;    TRANSTHORACIC ECHOCARDIOGRAM  08/25/10; 05/2012; 03/23/16;12/2017   mild asymmetric LVH, normal systolic function, normal diastolic fxn, mild-to-mod mitral regurg, mild aortic valve sclerosis and trace AI, mild aortic root dilatation. 2014 f/u showed EF 40-45%, mod LAE, A FIB.  02/2016 EF 40%, diffuse hypokinesis, grade 2 DD. 12/2017 EF 35-40%,diffuse hypokin,grd III DD, mild MR   WISDOM TOOTH EXTRACTION  06/27/2021   EF 50-55%, mild aortic root/ascending aorta dilatation (41-42 mm).    Outpatient Medications Prior to Visit  Medication Sig Dispense Refill   BD INSULIN  SYRINGE U/F 31G X 5/16" 1 ML MISC USE DAILY AS DIRECTED 100 each 3   COMBIGAN  0.2-0.5 % ophthalmic solution Place 1 drop into both eyes at bedtime.      Continuous Blood Gluc Receiver (FREESTYLE LIBRE 2 READER) DEVI 1 each by Does not apply route daily. 1 each 3   Continuous Blood Gluc Sensor (FREESTYLE LIBRE 2 SENSOR) MISC 1 each by Does not apply route every 14 (fourteen) days. 6 each 3   cyclobenzaprine  (FLEXERIL ) 5 MG tablet Take 1 tablet (5 mg total) by mouth 2 (two) times daily as needed for muscle spasms. 15 tablet 0   diclofenac  Sodium (VOLTAREN ) 1 % GEL Apply 4 g topically 4 (four) times daily. 100 g 1   ENTRESTO  97-103 MG TAKE 1 TABLET TWO TIMES A DAY 180 tablet 3   eplerenone  (INSPRA ) 25 MG tablet TAKE 1 TABLET DAILY 90 tablet 3   gabapentin  (NEURONTIN ) 300 MG capsule TAKE 2 CAPSULES TWICE A DAY 360 capsule 3   insulin  aspart (NOVOLOG  FLEXPEN) 100 UNIT/ML FlexPen Inject 10 units before each meal (Patient taking differently: Inject 12 units before each meal) 30 mL 3   insulin  glargine (LANTUS  SOLOSTAR) 100 UNIT/ML Solostar Pen Inject 34-36 Units into the skin daily. (Patient taking differently: Inject 34-36 Units into the skin daily. 32 units daily) 30 mL 3   ketoconazole (NIZORAL) 2 % cream Apply 1 Application topically daily.     ketorolac  (ACULAR ) 0.5 % ophthalmic solution      lidocaine  (LIDODERM ) 5 % PLACE 1 PATCH ON THE  SKIN DAILY. REMOVE AND DISCARD PATCH WITHIN 12 HOURS OR AS DIRECTED BY DOCTOR 90 patch 0   meclizine  (ANTIVERT ) 25 MG tablet TAKE 1 TABLET THREE TIMES A DAY AS NEEDED FOR DIZZINESS 270 tablet 1   metoprolol  succinate (TOPROL -XL) 50 MG 24 hr tablet TAKE 1 TABLET DAILY. TAKE WITH OR IMMEDIATELY FOLLLOWING A MEAL 90 tablet 0   omeprazole  (PRILOSEC) 40 MG capsule 1 cap po bid 180 capsule 1  oxybutynin  (DITROPAN -XL) 10 MG 24 hr tablet TAKE 1 TABLET AT BEDTIME 90 tablet 3   oxyCODONE -acetaminophen  (PERCOCET) 10-325 MG tablet 1-2 tabs po tid prn pain 90 tablet 0   ROCKLATAN 0.02-0.005 % SOLN Apply to eye.     rOPINIRole  (REQUIP ) 1 MG tablet Take 1 tablet (1 mg total) by mouth daily. 90 tablet 0   rosuvastatin  (CRESTOR ) 20 MG tablet TAKE 1 TABLET DAILY 90 tablet 3   Semaglutide , 1 MG/DOSE, 4 MG/3ML SOPN Inject 1 mg as directed once a week. 9 mL 3   sertraline  (ZOLOFT ) 100 MG tablet TAKE 1 TABLET DAILY 90 tablet 0   SURE COMFORT PEN NEEDLES 31G X 5 MM MISC USE TO INJECT INSULIN  UNDER THE SKIN 100 each 11   XARELTO  20 MG TABS tablet TAKE 1 TABLET DAILY WITH SUPPER 90 tablet 3   naloxone  (NARCAN ) nasal spray 4 mg/0.1 mL 1 spray into nostril as needed for opioid overdose.  Repeat in 3 min if no improvement (Patient not taking: Reported on 12/28/2023) 2 each 1   No facility-administered medications prior to visit.    No Known Allergies  Review of Systems  As per HPI  PE:    12/28/2023    8:11 AM 11/15/2023    1:22 PM 10/19/2023   10:40 AM  Vitals with BMI  Height 5\' 11"  5\' 11"    Weight 219 lbs 10 oz 219 lbs 13 oz   BMI 30.64 30.67   Systolic 97 101 110  Diastolic 64 64 72  Pulse 89 81 70     Physical Exam  Dental: Alert and well-appearing. Inguinal region is without mass or swelling or palpable subcutaneous abnormality.  No tenderness.  He has some pain with resisted hip flexion. FABER and FADIR do not elicit pain.  He does have a little bit of limitation in flexion, adduction, and internal  rotation due to stiffness.  LABS:  Last CBC Lab Results  Component Value Date   WBC 6.7 08/08/2023   HGB 18.0 (H) 08/08/2023   HCT 53.0 (H) 08/08/2023   MCV 92.1 08/08/2023   MCH 30.4 08/08/2023   RDW 12.8 08/08/2023   PLT 123 (L) 08/08/2023   Last metabolic panel Lab Results  Component Value Date   GLUCOSE 157 (H) 08/08/2023   NA 136 08/08/2023   K 6.0 (H) 08/08/2023   CL 102 08/08/2023   CO2 22 08/08/2023   BUN 32 (H) 08/08/2023   CREATININE 1.40 (H) 08/08/2023   GFRNONAA >60 08/08/2023   CALCIUM  7.1 (L) 08/08/2023   PROT 7.3 04/06/2023   ALBUMIN 4.0 04/06/2023   BILITOT 0.6 04/06/2023   ALKPHOS 78 04/06/2023   AST 28 04/06/2023   ALT 24 04/06/2023   ANIONGAP 7 08/08/2023   Last hemoglobin A1c Lab Results  Component Value Date   HGBA1C 7.1 (A) 10/11/2023   IMPRESSION AND PLAN:  #1 right groin pain. Suspect muscular/soft tissue. Will get right hip radiograph to rule out significant DJD. No medications prescribed today. Reassurance given.  2.  Diastases rectus.  We discussed this some, reassured him that it was not a hernia.  3.  Thoracic DDD with chronic radiculopathy. He is under the care of a neurosurgeon and they are considering spinal fixation surgery.  An After Visit Summary was printed and given to the patient.  FOLLOW UP: No follow-ups on file.  Signed:  Arletha Lady, MD           12/28/2023

## 2023-12-31 ENCOUNTER — Ambulatory Visit (HOSPITAL_BASED_OUTPATIENT_CLINIC_OR_DEPARTMENT_OTHER)
Admission: RE | Admit: 2023-12-31 | Discharge: 2023-12-31 | Disposition: A | Discharge status: Home / Self Care | Source: Ambulatory Visit | Attending: Family Medicine | Admitting: Family Medicine

## 2023-12-31 ENCOUNTER — Other Ambulatory Visit: Payer: Self-pay | Admitting: Family Medicine

## 2023-12-31 DIAGNOSIS — R1031 Right lower quadrant pain: Secondary | ICD-10-CM | POA: Diagnosis not present

## 2023-12-31 DIAGNOSIS — M6208 Separation of muscle (nontraumatic), other site: Secondary | ICD-10-CM

## 2024-01-01 ENCOUNTER — Ambulatory Visit: Attending: Surgery | Admitting: Physician Assistant

## 2024-01-01 ENCOUNTER — Encounter: Payer: Self-pay | Admitting: Physician Assistant

## 2024-01-01 VITALS — BP 103/66 | HR 86 | Resp 20 | Ht 71.0 in | Wt 219.0 lb

## 2024-01-01 DIAGNOSIS — I7121 Aneurysm of the ascending aorta, without rupture: Secondary | ICD-10-CM | POA: Insufficient documentation

## 2024-01-01 DIAGNOSIS — I712 Thoracic aortic aneurysm, without rupture, unspecified: Secondary | ICD-10-CM | POA: Insufficient documentation

## 2024-01-01 NOTE — Patient Instructions (Signed)
 Risk Modification in those with ascending thoracic aortic aneurysm:  Continue good control of blood pressure (prefer SBP 130/80 or less)  2. Avoid fluoroquinolone antibiotics (I.e Ciprofloxacin , Avelox, Levofloxacin, Ofloxacin)  3.  Use of statin (you are taking rosuvastatin )  4.  Exercise and activity limitations is individualized, but in general, contact sports are to be  avoided and one should avoid heavy lifting (defined as half of ideal body weight) and exercises involving sustained Valsalva maneuver.  5.  Follow-up in 1 year with CTA chest

## 2024-01-01 NOTE — Progress Notes (Signed)
 [address]       [name] [address]             [phone]     HPI: Mr. Mason [name] is an 83 year old gentleman past history of hypertension, type 2 diabetes mellitus, gastroesophageal reflux disease, peripheral neuropathy, chronic fatigue syndrome, and chronic back pain.  He has been followed by our practice for a few years with abdominal thoracic aortic aneurysm.  When he was seen in our office about 1 year ago the ascending aortic dimension was stable, measuring 4.2 cm. Mr. Mason [name] reports no changes in his health since his last visit.  He had no chest pain, dizziness, visual changes, or shortness of breath.    Current Outpatient Medications  Medication Sig Dispense Refill   BD INSULIN  SYRINGE U/F 31G X 5/16" 1 ML MISC USE DAILY AS DIRECTED 100 each 3   COMBIGAN  0.2-0.5 % ophthalmic solution Place 1 drop into both eyes at bedtime.      Continuous Blood Gluc Receiver (FREESTYLE LIBRE 2 READER) DEVI 1 each by Does not apply route daily. 1 each 3   Continuous Blood Gluc Sensor (FREESTYLE LIBRE 2 SENSOR) MISC 1 each by Does not apply route every 14 (fourteen) days. 6 each 3   cyclobenzaprine  (FLEXERIL ) 5 MG tablet Take 1 tablet (5 mg total) by mouth 2 (two) times daily as needed for muscle spasms. 15 tablet 0   diclofenac  Sodium (VOLTAREN ) 1 % GEL Apply 4 g topically 4 (four) times daily. 100 g 1   ENTRESTO  97-103 MG TAKE 1 TABLET TWO TIMES A DAY 180 tablet 3   eplerenone  (INSPRA ) 25 MG tablet TAKE 1 TABLET DAILY 90 tablet 3   gabapentin  (NEURONTIN ) 300 MG capsule TAKE 2 CAPSULES TWICE A DAY 360 capsule 3   insulin  aspart (NOVOLOG  FLEXPEN) 100 UNIT/ML FlexPen Inject 10 units before each meal (Patient taking differently: Inject 12 units before each meal) 30 mL 3   insulin  glargine (LANTUS  SOLOSTAR) 100 UNIT/ML Solostar Pen Inject 34-36 Units into the skin daily. (Patient taking differently: Inject 34-36 Units into the skin daily. 32 units daily) 30 mL 3   ketoconazole  (NIZORAL) 2 % cream Apply 1 Application topically daily.     ketorolac  (ACULAR ) 0.5 % ophthalmic solution      lidocaine  (LIDODERM ) 5 % PLACE 1 PATCH ON THE SKIN DAILY. REMOVE AND DISCARD PATCH WITHIN 12 HOURS OR AS DIRECTED BY DOCTOR 90 patch 0   meclizine  (ANTIVERT ) 25 MG tablet TAKE 1 TABLET THREE TIMES A DAY AS NEEDED FOR DIZZINESS 270 tablet 1   metoprolol  succinate (TOPROL -XL) 50 MG 24 hr tablet TAKE 1 TABLET DAILY. TAKE WITH OR IMMEDIATELY FOLLLOWING A MEAL 90 tablet 0   naloxone  (NARCAN ) nasal spray 4 mg/0.1 mL 1 spray into nostril as needed for opioid overdose.  Repeat in 3 min if no improvement (Patient not taking: Reported on 12/28/2023) 2 each 1   omeprazole  (PRILOSEC) 40 MG capsule 1 cap po bid 180 capsule 1   oxybutynin  (DITROPAN -XL) 10 MG 24 hr tablet TAKE 1 TABLET AT BEDTIME 90 tablet 3   oxyCODONE -acetaminophen  (PERCOCET) 10-325 MG tablet 1-2 tabs po tid prn pain 90 tablet 0   ROCKLATAN 0.02-0.005 % SOLN Apply to eye.     rOPINIRole  (REQUIP ) 1 MG tablet Take 1 tablet (1 mg total) by mouth daily. 90 tablet 0   rosuvastatin  (CRESTOR ) 20 MG tablet TAKE 1 TABLET DAILY 90 tablet 3   Semaglutide , 1 MG/DOSE,  4 MG/3ML SOPN Inject 1 mg as directed once a week. 9 mL 3   sertraline  (ZOLOFT ) 100 MG tablet TAKE 1 TABLET DAILY 90 tablet 0   SURE COMFORT PEN NEEDLES 31G X 5 MM MISC USE TO INJECT INSULIN  UNDER THE SKIN 100 each 11   XARELTO  20 MG TABS tablet TAKE 1 TABLET DAILY WITH SUPPER 90 tablet 3   No current facility-administered medications for this visit.     Physical Exam: Vital signs BP 103/66 Heart rate 66 Respirations 20 SpO2 95% on room air  General: Pleasant 83 year old male in no distress. Neck: No JVD or carotid bruit. Heart: Regular rate and rhythm, no murmur. Chest: Breath sounds are clear to auscultation.  Normal work of breathing at rest. Extremities: No peripheral edema Neuro: Grossly intact   Diagnostic Tests: CLINICAL DATA:  Thoracic aneurysms   EXAM: CT  ANGIOGRAPHY CHEST WITH CONTRAST   TECHNIQUE: Multidetector CT imaging of the chest was performed using the standard protocol during bolus administration of intravenous contrast. Multiplanar CT image reconstructions and MIPs were obtained to evaluate the vascular anatomy.   RADIATION DOSE REDUCTION: This exam was performed according to the departmental dose-optimization program which includes automated exposure control, adjustment of the mA and/or kV according to patient size and/or use of iterative reconstruction technique.   CONTRAST:  75mL OMNIPAQUE  IOHEXOL  350 MG/ML SOLN   COMPARISON:  CT chest June 21, 2022   FINDINGS: Cardiovascular: Satisfactory opacification of the pulmonary arteries to the segmental level. No evidence of pulmonary embolism. Normal heart size. No pericardial effusion. Minimal coronary artery calcifications. Minimal aneurysmal dilatation of the ascending aorta measuring 4.2 x 4.0 cm. Recommend annual imaging followup by CTA or MRA. This recommendation follows 2010 ACCF/AHA/AATS/ACR/ASA/SCA/SCAI/SIR/STS/SVM Guidelines for the Diagnosis and Management of Patients with Thoracic Aortic Disease. Circulation. 2010; 121: N562-Z308. Aortic aneurysm NOS (ICD10-I71.9).   Mediastinum/Nodes: No enlarged mediastinal, hilar, or axillary lymph nodes. Thyroid  gland, trachea, and esophagus demonstrate no significant findings.   Lungs/Pleura: Lungs are clear. No pleural effusion or pneumothorax.   Upper Abdomen: No acute abnormality.   Musculoskeletal: No chest wall abnormality. No acute or significant osseous findings.   Review of the MIP images confirms the above findings.   IMPRESSION: *Minimal aneurysmal dilatation of the ascending aorta measuring 4.2 x 4.0 cm. *No evidence of pulmonary embolism. *No acute cardiopulmonary disease.     Electronically Signed   By: Mason [name] M.D.   On: 12/25/2023 09:46    Impression / Plan: Stable 4.2 cm fusiform  ascending aortic aneurysm.  There is no indication for surgical intervention.  We reviewed the importance of careful blood pressure control and activity limitations.  Follow-up in 1 year with repeat CTA per guideline recommendations.   Mason [name] G. Akaila Rambo, PA-C [facility] [phone]

## 2024-01-07 ENCOUNTER — Ambulatory Visit: Payer: Self-pay | Admitting: Family Medicine

## 2024-01-17 ENCOUNTER — Encounter: Payer: Self-pay | Admitting: Cardiovascular Disease

## 2024-01-17 DIAGNOSIS — M5416 Radiculopathy, lumbar region: Secondary | ICD-10-CM | POA: Diagnosis not present

## 2024-01-17 DIAGNOSIS — M4310 Spondylolisthesis, site unspecified: Secondary | ICD-10-CM | POA: Diagnosis not present

## 2024-01-17 DIAGNOSIS — M5414 Radiculopathy, thoracic region: Secondary | ICD-10-CM | POA: Diagnosis not present

## 2024-01-18 ENCOUNTER — Telehealth: Payer: Self-pay | Admitting: *Deleted

## 2024-01-18 NOTE — Telephone Encounter (Signed)
   Pre-operative Risk Assessment    Patient Name: Mason [name]  DOB: 1940/10/16 MRN: [id number]   Date of last office visit: 04/12/23 DR. CROITORU Date of next office visit: NONE   Request for Surgical Clearance    Procedure:  L2-3, L3-4 LUMBAR FUSION  Date of Surgery:  Clearance TBD                                Surgeon:  DR. VICTORY GUNNELS Surgeon's Group or Practice Name:  [facility] Phone number:  [phone] Fax number:  [phone]   Type of Clearance Requested:   - Medical ; NONE INDICATED ON FORM TO BE HELD   Type of Anesthesia:  General    Additional requests/questions:    Bonney Niels Jest   01/18/2024, 1:21 PM

## 2024-01-23 ENCOUNTER — Ambulatory Visit (INDEPENDENT_AMBULATORY_CARE_PROVIDER_SITE_OTHER): Payer: Medicare Other

## 2024-01-23 DIAGNOSIS — I442 Atrioventricular block, complete: Secondary | ICD-10-CM | POA: Diagnosis not present

## 2024-01-23 LAB — CUP PACEART REMOTE DEVICE CHECK
Battery Remaining Longevity: 7 mo
Battery Voltage: 2.83 V
Brady Statistic AP VP Percent: 0 %
Brady Statistic AP VS Percent: 0 %
Brady Statistic AS VP Percent: 0 %
Brady Statistic AS VS Percent: 0 %
Brady Statistic RA Percent Paced: 0 %
Brady Statistic RV Percent Paced: 99.96 %
Date Time Interrogation Session: 20250702022709
HighPow Impedance: 68 Ohm
Implantable Lead Connection Status: 753985
Implantable Lead Connection Status: 753985
Implantable Lead Connection Status: 753985
Implantable Lead Implant Date: 20130715
Implantable Lead Implant Date: 20180614
Implantable Lead Implant Date: 20180614
Implantable Lead Location: 753858
Implantable Lead Location: 753859
Implantable Lead Location: 753860
Implantable Lead Model: 4598
Implantable Pulse Generator Implant Date: 20180614
Lead Channel Impedance Value: 1064 Ohm
Lead Channel Impedance Value: 193.707
Lead Channel Impedance Value: 211.891
Lead Channel Impedance Value: 231.585
Lead Channel Impedance Value: 253.786
Lead Channel Impedance Value: 285.934
Lead Channel Impedance Value: 342 Ohm
Lead Channel Impedance Value: 361 Ohm
Lead Channel Impedance Value: 418 Ohm
Lead Channel Impedance Value: 418 Ohm
Lead Channel Impedance Value: 513 Ohm
Lead Channel Impedance Value: 532 Ohm
Lead Channel Impedance Value: 646 Ohm
Lead Channel Impedance Value: 665 Ohm
Lead Channel Impedance Value: 703 Ohm
Lead Channel Impedance Value: 779 Ohm
Lead Channel Impedance Value: 931 Ohm
Lead Channel Impedance Value: 988 Ohm
Lead Channel Pacing Threshold Amplitude: 0.5 V
Lead Channel Pacing Threshold Amplitude: 0.5 V
Lead Channel Pacing Threshold Pulse Width: 0.4 ms
Lead Channel Pacing Threshold Pulse Width: 1 ms
Lead Channel Sensing Intrinsic Amplitude: 0.625 mV
Lead Channel Sensing Intrinsic Amplitude: 13.75 mV
Lead Channel Sensing Intrinsic Amplitude: 13.75 mV
Lead Channel Setting Pacing Amplitude: 1 V
Lead Channel Setting Pacing Amplitude: 2 V
Lead Channel Setting Pacing Pulse Width: 0.4 ms
Lead Channel Setting Pacing Pulse Width: 1 ms
Lead Channel Setting Sensing Sensitivity: 0.3 mV
Zone Setting Status: 755011

## 2024-01-24 ENCOUNTER — Ambulatory Visit: Payer: Self-pay | Admitting: Cardiovascular Disease

## 2024-01-26 ENCOUNTER — Encounter: Payer: Self-pay | Admitting: Cardiovascular Disease

## 2024-01-29 ENCOUNTER — Encounter: Payer: Self-pay | Admitting: Internal Medicine

## 2024-01-29 ENCOUNTER — Telehealth: Payer: Self-pay | Admitting: *Deleted

## 2024-01-29 ENCOUNTER — Ambulatory Visit (INDEPENDENT_AMBULATORY_CARE_PROVIDER_SITE_OTHER): Admitting: Internal Medicine

## 2024-01-29 VITALS — BP 122/70 | HR 82 | Ht 71.0 in | Wt 218.6 lb

## 2024-01-29 DIAGNOSIS — E1165 Type 2 diabetes mellitus with hyperglycemia: Secondary | ICD-10-CM | POA: Diagnosis not present

## 2024-01-29 DIAGNOSIS — Z7984 Long term (current) use of oral hypoglycemic drugs: Secondary | ICD-10-CM

## 2024-01-29 DIAGNOSIS — Z7985 Long-term (current) use of injectable non-insulin antidiabetic drugs: Secondary | ICD-10-CM

## 2024-01-29 DIAGNOSIS — Z794 Long term (current) use of insulin: Secondary | ICD-10-CM | POA: Diagnosis not present

## 2024-01-29 DIAGNOSIS — E1159 Type 2 diabetes mellitus with other circulatory complications: Secondary | ICD-10-CM | POA: Diagnosis not present

## 2024-01-29 DIAGNOSIS — E785 Hyperlipidemia, unspecified: Secondary | ICD-10-CM | POA: Diagnosis not present

## 2024-01-29 LAB — POCT GLYCOSYLATED HEMOGLOBIN (HGB A1C): Hemoglobin A1C: 6.8 % — AB (ref 4.0–5.6)

## 2024-01-29 MED ORDER — EMPAGLIFLOZIN 10 MG PO TABS: 10.0000 mg | ORAL_TABLET | Freq: Every day | ORAL | Status: DC

## 2024-01-29 MED ORDER — LANTUS SOLOSTAR 100 UNIT/ML ~~LOC~~ SOPN: 30.0000 [IU] | PEN_INJECTOR | Freq: Every day | SUBCUTANEOUS | Status: DC

## 2024-01-29 NOTE — Patient Instructions (Addendum)
 Please continue: - Jardiance  10 mg daily before b'fast - Ozempic  1 mg weekly - Lantus  30 units at bedtime - NovoLog  15 min before meals: 10-12 units before brunch 8-10 units before dinner If you have to take the suppertime insulin  after the meal, take only up to 5 units.  Try to stop the snack at night.  Try not to forgot the NovoLog  before dinner.  You can try alpha lipoic acid 200 mg 2x a day for neuropathy.  Also, MagniLife Diabetic neuropathy cream.  Please return in 3-4 months.

## 2024-01-29 NOTE — Telephone Encounter (Signed)
 Pt has been scheduled tele preop appt 02/06/24. Pt will be out of [place] this week 01/2013-10-15 due to a death in the family.    Med rec and consent are done.

## 2024-01-29 NOTE — Telephone Encounter (Signed)
 Pt has been scheduled tele preop appt 02/06/24. Pt will be out [place] this [place] 01/2013/10/16 due to a death in the family.   Med rec and consent are done.     Patient Consent for Virtual Visit        Mason [name] has provided verbal consent on 01/29/2024 for a virtual visit (video or telephone).   CONSENT FOR VIRTUAL VISIT FOR:  Mason [name]  By participating in this virtual visit I agree to the following:  I hereby voluntarily request, consent and authorize [facility] and its employed or contracted physicians, physician assistants, nurse practitioners or other licensed health care professionals (the Practitioner), to provide me with telemedicine health care services (the "Services) as deemed necessary by the treating Practitioner. I acknowledge and consent to receive the Services by the Practitioner via telemedicine. I understand that the telemedicine visit will involve communicating with the Practitioner through live audiovisual communication technology and the disclosure of certain medical information by electronic transmission. I acknowledge that I have been given the opportunity to request an in-person assessment or other available alternative prior to the telemedicine visit and am voluntarily participating in the telemedicine visit.  I understand that I have the right to withhold or withdraw my consent to the use of telemedicine in the course of my care at any time, without affecting my right to future care or treatment, and that the Practitioner or I may terminate the telemedicine visit at any time. I understand that I have the right to inspect all information obtained and/or recorded in the course of the telemedicine visit and may receive copies of available information for a reasonable fee.  I understand that some of the potential risks of receiving the Services via telemedicine include:  Delay or interruption in medical evaluation due to technological equipment failure or  disruption; Information transmitted may not be sufficient (e.g. poor resolution of images) to allow for appropriate medical decision making by the Practitioner; and/or  In rare instances, security protocols could fail, causing a breach of personal health information.  Furthermore, I acknowledge that it is my responsibility to provide information about my medical history, conditions and care that is complete and accurate to the best of my ability. I acknowledge that Practitioner's advice, recommendations, and/or decision may be based on factors not within their control, such as incomplete or inaccurate data provided by me or distortions of diagnostic images or specimens that may result from electronic transmissions. I understand that the practice of medicine is not an exact science and that Practitioner makes no warranties or guarantees regarding treatment outcomes. I acknowledge that a copy of this consent can be made available to me via my patient portal [facility] MyChart), or I can request a printed copy by calling the office of [facility].    I understand that my insurance will be billed for this visit.   I have read or had this consent read to me. I understand the contents of this consent, which adequately explains the benefits and risks of the Services being provided via telemedicine.  I have been provided ample opportunity to ask questions regarding this consent and the Services and have had my questions answered to my satisfaction. I give my informed consent for the services to be provided through the use of telemedicine in my medical care

## 2024-01-29 NOTE — Telephone Encounter (Signed)
 Patient with diagnosis of afib on Xarelto  for anticoagulation.    Procedure:  L2-3, L3-4 LUMBAR FUSION  Date of procedure: TBD   CHA2DS2-VASc Score = 8   This indicates a 10.8% annual risk of stroke. The patient's score is based upon: CHF History: 1 HTN History: 1 Diabetes History: 1 Stroke History: 2 Vascular Disease History: 1 Age Score: 2 Gender Score: 0      CrCl 63 ml/min Platelet count 123  Patient has not had an Afib/aflutter ablation within the last 3 months or DCCV within the last 30 days  Per office protocol, patient can hold Xarelto  for 3 days prior to procedure.    Patient previously approved by Dr. Pietro to hold 3 days. He should resume as soon as safely possible due to stroke risk.  **This guidance is not considered finalized until pre-operative APP has relayed final recommendations.**

## 2024-01-29 NOTE — Progress Notes (Signed)
 Patient ID: Mason [name], male   DOB: 12-08-1940, 83 y.o.   MRN: [id number]   HPI: Mason [name] is a 83 y.o.-year-old male, initially referred by his PCP, Dr. Candise, returning for follow-up for DM2, dx in ~2011, insulin -dependent since 2019, uncontrolled, with long-term complications (CAD with ?h/o AMI, nonischemic CMP, CHF, Afib, cerebrovascular disease with h/o TIA, CKD stage III, peripheral neuropathy, ED).  Last visit 4 months ago.    Interim history: No increased urination, nausea, chest pain.  He has back pain -has occasional steroid injections. At last visit he had very dry mouth, quite bothersome, and also craving sweets.  We held Farxiga at that time.  Reviewed HbA1c levels:  Lab Results  Component Value Date   HGBA1C 7.1 (A) 10/11/2023   HGBA1C 6.6 (A) 06/11/2023   HGBA1C 7.1 (A) 02/08/2023   HGBA1C 7.1 (A) 09/29/2022   HGBA1C 6.6 (A) 05/31/2022   HGBA1C 7.3 (A) 01/26/2022   HGBA1C 7.2 (A) 09/08/2021   HGBA1C 6.7 (A) 12/09/2020   HGBA1C 7.6 (A) 09/09/2020   HGBA1C 9.7 (H) 06/22/2020   HGBA1C 9.0 (H) 03/23/2020   HGBA1C 9.2 (H) 03/16/2020   HGBA1C 9.9 (H) 11/20/2019   HGBA1C 9.7 (H) 05/27/2019   HGBA1C 9.0 (H) 02/26/2019   HGBA1C 10.1 (H) 11/29/2018   HGBA1C 9.0 (A) 08/29/2018   HGBA1C 9.0 08/29/2018   HGBA1C 9.0 (A) 08/29/2018   HGBA1C 9.0 (A) 08/29/2018   HGBA1C 7.8 (A) 05/14/2018   HGBA1C 8.1 (H) 02/06/2018   HGBA1C 8.0 (H) 10/26/2017   HGBA1C 8.2 (H) 07/27/2017   HGBA1C 7.0 03/01/2017   HGBA1C 7.1 11/16/2016   HGBA1C 7.8 (H) 07/03/2016   HGBA1C 6.8 03/14/2016   HGBA1C 7.0 (H) 12/09/2015   HGBA1C 7.4 (H) 09/20/2015   04/11/2021: HbA1c 7.1%  He was previously on: - Jardiance  10 mg before breakfast - Trulicity  1.5 >> 3  mg weekly - NovoLog  pens :  14-18 >> 18-20 >> 15-16 >> 15 >> 10 >> but taking 12 units before brunch 8-10 units before dinner >> 0 units >> 7-8 >> but taking 10 units before a larger dinner  If you have a snack at night, you may need ~5  units before the snack - Lantus  vial 44 >> 40 >> 36-40 >> 42 >> 45 >> 40 >> but taking 42 units at bedtime She was on metformin  in the past.  Now on: - Jardiance  10 mg before breakfast - Ozempic  1 mg weekly - Lantus  36 >> 42 >> 38 >> 34 >> 32 >> 30 units at bedtime - NovoLog  15 min before meals: 8-10 >> 12 units before brunch 6-8 >> 10 units before dinner If you have to take the suppertime insulin  after the meal, take only up to 5 units. He was previously on Trulicity .  Pt checks his sugars more than 4 times a day with his freestyle libre CGM:  Previously:  Previously:  Lowest sugar was: 44 >> 48 >> 45 >> 48; he has hypoglycemia awareness at 70.  Highest sugar was 400 >> .SABRA.  309 >> 235 >> 200s >> 250.  Glucometer: Freestyle  Pt's meals are: - Breakfast: cereals, eggs, grits, toast - Lunch: may skip or sandwich - Dinner: meat + veggies + starch - Snacks: apple/cheese >> bananasplits at night Patient saw nutrition a long time ago.  -+ Stage III CKD, last BUN/creatinine:  Lab Results  Component Value Date   BUN 32 (H) 08/08/2023   BUN 17 08/08/2023   CREATININE  1.40 (H) 08/08/2023   CREATININE 1.10 08/08/2023    No results found for: MICRALBCREAT On Entresto .  -+ HL; last set of lipids: Lab Results  Component Value Date   CHOL 116 04/06/2023   HDL 42.20 04/06/2023   LDLCALC 45 04/06/2023   LDLDIRECT 103.0 06/22/2020   TRIG 145.0 04/06/2023   CHOLHDL 3 04/06/2023  04/11/2021: 141/214/45/53 Previously on Zocor  20, but changed to Crestor  20 mg daily.  - last eye exam was 2025:+ DR. He also has glaucoma. Dr. Octavia and Dr. Elner.  - + numbness and tingling in his feet.  On Neurontin  300 mg twice a day.   Seeing podiatry - Dr. Joshua at the [facility].  Latest foot exam 11/22/2023.  No known FH of DM.  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  Other labs:    Drinks beer  2-3x a day, but not quite every day.  ROS: + see HPI  I reviewed pt's medications, allergies, PMH, social hx, family hx, and changes were documented in the history of present illness. Otherwise, unchanged from my initial visit note.  Past Medical History:  Diagnosis Date   AICD (automatic cardioverter/defibrillator) present 2018   MDT CRT-D.  Fatigue-->completely pacer dependent.  Pacer settings adjusted 12/2017 to allow more chronotrophc variance with ADL's//exertion.   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   02/2016 EF 40%, diffuse hypokinesis, grade 2 DD.  Myoc perf imaging showed EF 32% 04/2016.  Pt upgraded to CRT-D 01/04/17.   Chronic renal insufficiency, stage III (moderate) (HCC) 2015   CrCl about 60 ml/min   Complete heart block (HCC)    Has dual chamber pacer.   COVID-19 virus infection 01/05/2021   paxlovid   Depression    DOE (dyspnea on exertion)    NYHA class II/III CHF   Dyspnea 2021   with exertion, bending over   Episodic low back pain 01/22/2013   w/intermittent radiculitis (12/2014 his neurologist referred him to pain mgmt for epidural steroid injection)   Erectile dysfunction 2019   due to zoloft --urol rx'd viagra   GERD (gastroesophageal reflux disease)    H/O tilt table evaluation 11/02/2005   negative   Helicobacter pylori gastritis 01/2016   History of adenomatous polyp of colon 10/12/2011   Dr. Rollin (3 right side of colon- tubular adenomas removed)   History of cardiovascular stress test 05/28/2012   no ischemia, EF 37%, imaging results are unchanged and within normal variance   History of chronic prostatitis    History of kidney stones    History of vertigo    + Hx  of posterior HA's.  Neuro (Dr. Maurice) eval 2011.  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   Hyperlipidemia    Hypertension    Lumbar spondylosis    lumbosacral radiculopathy at L4 by EMG testing, right foot drop (neurologist is Dr. Birder with [facility] in [place])--neurologist referred him to neurosurgery   Migraine    used to have them all the time; none for years (01/04/2017)   Myocardial infarction [facility]) ?1970s   not entirely certain  of this   Nephrolithiasis 07/2012   Left UVJ 2 mm stone with dilation of renal collecting system and slight hydroureter on right   Neuropathy    NICM (nonischemic cardiomyopathy) (HCC)    a. 02/2018 Cath: LM nl, LAD min irregs, LCX no, RCA 20d. [id number]. Fick CO/CI 4.4/2.0.   Osteoarthritis, multiple sites    Shoulders, back, knees   Pacemaker 02/05/2012   dual chamber, complete heart block, meddtronic revo, lasted checked 12/2015.  Since no CAD on cath 05/2016, cards recommends upgrade to CRT-D.   Permanent atrial fibrillation (HCC)    DCCV 07/09/13-converted, lasted two days, then back into afib--needs lifetime anticoagulation (Xarelto  as of 09/2014)   Prostate cancer screening 09/2017   done by urol annually (normal prostate exam documented + PSA 0.84 as of 10/01/17 urol f/u.  10/2018 urol f/u PSA 0.6, no prostate nodule.   Rectus diastasis    Right ankle sprain 08/2017   w/distal fibula avulsion fx noted on u/s but not plain film-(Dr. Hudnall).   Skin cancer of arm, left    burned it off (01/04/2017)   TIA (transient ischemic attack)    L face and L arm weakness. Peri procedural->a. 03/22/2018 following cath. CT head neg. No MRI b/c has pacer. Likely due to embolus to distal branch of RMCA   Type II diabetes mellitus (HCC)    Past Surgical History:  Procedure Laterality Date   ABI's Bilateral 05/21/2018   normal   BACK SURGERY     BIV ICD INSERTION CRT-D N/A  01/04/2017   Procedure: BiV ICD ;  Surgeon: Inocencio Soyla Lunger, MD;  Location: [facility] INVASIVE CV LAB;  Service: Cardiovascular;  Laterality: N/A;   CARDIAC CATHETERIZATION N/A 06/14/2016   Minimal nonobstructive dz, EF 25-35%.  Procedure: Left Heart Cath and Coronary Angiography;  Surgeon: Peter M Swaziland, MD;  Location: [facility] INVASIVE CV LAB;  Service: Cardiovascular;  Laterality: N/A;   CARDIOVASCULAR STRESS TEST  2012   2012 nuclear perfusion study: low risk scan; 04/2016 normal myocardial perfusion imaging, EF 32%.   CARDIOVERSION  07/09/2012   Procedure: CARDIOVERSION;  Surgeon: Jerel Balding, MD;  Location: [facility] ENDOSCOPY;  Service: Cardiovascular;  Laterality: N/A;   CATARACT EXTRACTION W/ INTRAOCULAR LENS IMPLANT & ANTERIOR VITRECTOMY, BILATERAL Bilateral    CIRCUMCISION N/A 03/23/2020   Procedure: CIRCUMCISION ADULT;  Surgeon: Rosalind Zachary NOVAK, MD;  Location: [facility] ORS;  Service: Urology;  Laterality: N/A;   COLONOSCOPY W/ POLYPECTOMY  approx 2006; repeated 09/2011   Polyps on 2013 EGD as well, repeat 12/2014   COLONOSCOPY WITH PROPOFOL  N/A 07/08/2021   adenoma x 1. Procedure: COLONOSCOPY WITH PROPOFOL ;  Surgeon: Rollin Dover, MD;  Location: [facility] ENDOSCOPY;  Service: Endoscopy;  Laterality: N/A;   ESOPHAGEAL DILATION  10/19/2023   Procedure: DILATION, ESOPHAGUS;  Surgeon: Rollin Dover, MD;  Location: [facility] ENDOSCOPY;  Service: Gastroenterology;;   ESOPHAGOGASTRODUODENOSCOPY  10/18/2006   Done due to chronic GERD: Normal, bx showed no barrett's esophagus (Dr. Rollin)   EYE SURGERY  01/08/2023   FLEXOR TENDON REPAIR Left 10/02/2016   Procedure: LEFT RING FINGER WOUND EXPORATION AND FLEXOR TENDON REPAIR AND NERVE REPAIR;  Surgeon: Alm Hummer, MD;  Location: [facility] OR;  Service: Orthopedics;  Laterality: Left;   INSERT / REPLACE / REMOVE PACEMAKER  02/05/2012   dual chamber, sinus node dysfunction, sinus arrest, PAF, Medtronic Revo serial#-[id number] H: last checked 05/2015   LUMBAR LAMINECTOMY Left 1976    L4-5   PACEMAKER REMOVAL  01/04/2017   PERMANENT PACEMAKER INSERTION N/A  02/05/2012   Procedure: PERMANENT PACEMAKER INSERTION;  Surgeon: Jerel Balding, MD; Generator Medtronic [place] model [place] serial number [id number] H Laterality: N/A;   POLYPECTOMY  07/08/2021   Procedure: POLYPECTOMY;  Surgeon: Rollin Dover, MD;  Location: [facility] ENDOSCOPY;  Service: Endoscopy;;   RETINAL DETACHMENT SURGERY Left ~ 1999   REVERSE SHOULDER ARTHROPLASTY Left 2018   Left shoulder reverse [facility] in [place]).   RIGHT/LEFT HEART CATH AND CORONARY ANGIOGRAPHY N/A 03/22/2018   EF 30-35%, no CAD.  Procedure: RIGHT/LEFT HEART CATH AND CORONARY ANGIOGRAPHY;  Surgeon: Cherrie Toribio SAUNDERS, MD;  Location: [facility] INVASIVE CV LAB;  Service: Cardiovascular;  Laterality: N/A;   SAVORY DILATION N/A 10/19/2023   Procedure: EGD, WITH DILATION USING SAVARY-GILLIARD DILATOR OVER GUIDEWIRE;  Surgeon: Rollin Dover, MD;  Location: [facility] ENDOSCOPY;  Service: Gastroenterology;  Laterality: N/A;   TRANSTHORACIC ECHOCARDIOGRAM  08/25/10; 05/2012; 03/23/16;12/2017   mild asymmetric LVH, normal systolic function, normal diastolic fxn, mild-to-mod mitral regurg, mild aortic valve sclerosis and trace AI, mild aortic root dilatation. 2014 f/u showed EF 40-45%, mod LAE, A FIB.  02/2016 EF 40%, diffuse hypokinesis, grade 2 DD. 12/2017 EF 35-40%,diffuse hypokin,grd III DD, mild MR   WISDOM TOOTH EXTRACTION  06/27/2021   EF 50-55%, mild aortic root/ascending aorta dilatation (41-42 mm).   Social History   Socioeconomic History   Marital status: Married    Spouse name: Not on file   Number of children: Not on file   Years of education: Not on file   Highest education level: 12th grade  Occupational History   Not on file  Tobacco Use   Smoking status: Never   Smokeless tobacco: Never  Vaping Use   Vaping status: Never Used  Substance and Sexual Activity   Alcohol use: Yes    Comment: occ   Drug use: No   Sexual activity: Not  Currently  Other Topics Concern   Not on file  Social History Narrative   Not on file   Social Drivers of Health   Financial Resource Strain: Low Risk  (09/05/2023)   Overall Financial Resource Strain (CARDIA)    Difficulty of Paying Living Expenses: Not very hard  Food Insecurity: No Food Insecurity (09/05/2023)   Hunger Vital Sign    Worried About Running Out of Food in the Last Year: Never true    Ran Out of Food in the Last Year: Never true  Transportation Needs: No Transportation Needs (09/05/2023)   PRAPARE - Administrator, Civil Service (Medical): No    Lack of Transportation (Non-Medical): No  Physical Activity: Inactive (09/05/2023)   Exercise Vital Sign    Days of Exercise per Week: 0 days    Minutes of Exercise per Session: 0 min  Stress: No Stress Concern Present (09/05/2023)   [employer] of Occupational Health - Occupational Stress Questionnaire    Feeling of Stress : Not at all  Social Connections: Moderately Integrated (09/05/2023)   Social Connection and Isolation Panel    Frequency of Communication with Friends and Family: More than three times a week    Frequency of Social Gatherings with Friends and Family: More than three times a week    Attends Religious Services: More than 4 times per year    Active Member of [employer] or Organizations: No    Attends Banker Meetings: Never    Marital Status: Married  Catering manager Violence: Not At Risk (09/05/2023)   Humiliation, Afraid, Rape, and Kick questionnaire  Fear of Current or Ex-Partner: No    Emotionally Abused: No    Physically Abused: No    Sexually Abused: No   Current Outpatient Medications on File Prior to Visit  Medication Sig Dispense Refill   BD INSULIN  SYRINGE U/F 31G X 5/16 1 ML MISC USE DAILY AS DIRECTED 100 each 3   COMBIGAN  0.2-0.5 % ophthalmic solution Place 1 drop into both eyes at bedtime.      Continuous Blood Gluc Receiver (FREESTYLE LIBRE 2 READER) DEVI 1 each  by Does not apply route daily. 1 each 3   Continuous Blood Gluc Sensor (FREESTYLE LIBRE 2 SENSOR) MISC 1 each by Does not apply route every 14 (fourteen) days. 6 each 3   cyclobenzaprine  (FLEXERIL ) 5 MG tablet Take 1 tablet (5 mg total) by mouth 2 (two) times daily as needed for muscle spasms. 15 tablet 0   diclofenac  Sodium (VOLTAREN ) 1 % GEL Apply 4 g topically 4 (four) times daily. 100 g 1   ENTRESTO  97-103 MG TAKE 1 TABLET TWO TIMES A DAY 180 tablet 3   eplerenone  (INSPRA ) 25 MG tablet TAKE 1 TABLET DAILY 90 tablet 3   gabapentin  (NEURONTIN ) 300 MG capsule TAKE 2 CAPSULES TWICE A DAY 360 capsule 3   insulin  aspart (NOVOLOG  FLEXPEN) 100 UNIT/ML FlexPen Inject 10 units before each meal (Patient taking differently: Inject 12 units before each meal) 30 mL 3   ketoconazole (NIZORAL) 2 % cream Apply 1 Application topically daily.     ketorolac  (ACULAR ) 0.5 % ophthalmic solution      lidocaine  (LIDODERM ) 5 % PLACE 1 PATCH ON THE SKIN DAILY. REMOVE AND DISCARD PATCH WITHIN 12 HOURS OR AS DIRECTED BY DOCTOR 90 patch 0   meclizine  (ANTIVERT ) 25 MG tablet TAKE 1 TABLET THREE TIMES A DAY AS NEEDED FOR DIZZINESS 270 tablet 1   metoprolol  succinate (TOPROL -XL) 50 MG 24 hr tablet TAKE 1 TABLET DAILY. TAKE WITH OR IMMEDIATELY FOLLLOWING A MEAL 90 tablet 0   naloxone  (NARCAN ) nasal spray 4 mg/0.1 mL 1 spray into nostril as needed for opioid overdose.  Repeat in 3 min if no improvement 2 each 1   omeprazole  (PRILOSEC) 40 MG capsule 1 cap po bid 180 capsule 1   oxybutynin  (DITROPAN -XL) 10 MG 24 hr tablet TAKE 1 TABLET AT BEDTIME 90 tablet 3   oxyCODONE -acetaminophen  (PERCOCET) 10-325 MG tablet 1-2 tabs po tid prn pain 90 tablet 0   ROCKLATAN 0.02-0.005 % SOLN Apply to eye.     rOPINIRole  (REQUIP ) 1 MG tablet Take 1 tablet (1 mg total) by mouth daily. 90 tablet 0   rosuvastatin  (CRESTOR ) 20 MG tablet TAKE 1 TABLET DAILY 90 tablet 3   Semaglutide , 1 MG/DOSE, 4 MG/3ML SOPN Inject 1 mg as directed once a week. 9 mL  3   sertraline  (ZOLOFT ) 100 MG tablet TAKE 1 TABLET DAILY 90 tablet 0   SURE COMFORT PEN NEEDLES 31G X 5 MM MISC USE TO INJECT INSULIN  UNDER THE SKIN 100 each 11   XARELTO  20 MG TABS tablet TAKE 1 TABLET DAILY WITH SUPPER 90 tablet 3   No current facility-administered medications on file prior to visit.   No Known Allergies Family History  Problem Relation Age of Onset   Heart failure Mother    Stroke Mother    Stroke Father    Heart disease Sister    Heart disease Sister    Cancer Sister        liver   Cancer Brother  lung   Cancer Brother        lung   Heart disease Brother    PE: BP 122/70   Pulse 82   Ht 5' 11 (1.803 m)   Wt 218 lb 9.6 oz (99.2 kg)   SpO2 95%   BMI 30.49 kg/m  Wt Readings from Last 10 Encounters:  01/29/24 218 lb 9.6 oz (99.2 kg)  01/01/24 219 lb (99.3 kg)  12/28/23 219 lb 9.6 oz (99.6 kg)  11/15/23 219 lb 12.8 oz (99.7 kg)  10/11/23 218 lb (98.9 kg)  06/11/23 223 lb (101.2 kg)  04/12/23 230 lb (104.3 kg)  04/06/23 221 lb 12.8 oz (100.6 kg)  03/28/23 227 lb (103 kg)  03/20/23 226 lb 9.6 oz (102.8 kg)   Constitutional: overweight, in NAD Eyes: EOMI, no exophthalmos ENT: no thyromegaly, no cervical lymphadenopathy Cardiovascular: RRR, No MRG Respiratory: CTA B Musculoskeletal: no deformities Skin: no rashes Neurological: + tremor with outstretched hands  ASSESSMENT: 1. DM2, insulin -dependent, uncontrolled, with complications: - CAD with ?h/o AMI - Dr. Francyne - nonischemic CMP - CHF, s/p AICD - Afib, s/p pacemaker - cerebrovascular disease with h/o TIA - CKD stage III - peripheral neuropathy - ED  No family history of medullary thyroid  cancer or personal history of pancreatitis.  2. HL  PLAN:  1. Patient with longstanding, previously uncontrolled type 2 diabetes, on SGLT2 inhibitor, GLP-1 receptor agonist and basal-bolus insulin  regimen, with higher HbA1c at last visit, at 7.1%, increased from 6.6%.  At last visit sugars  were slightly higher than before, fluctuating around the upper limit of the target range.  He had very dry mouth and was wondering whether this could be from Jardiance .  He was trying to stay well-hydrated but without success.  He also had increased cravings.  We discussed about trying to stop Jardiance  for 3 to 4 weeks to see if he felt better.  We did discuss about the cardiovascular benefits of Jardiance  and ideally he would stay on it. CGM interpretation: -At today's visit, we reviewed his CGM downloads: It appears that 84% of values are in target range (goal >70%), while 16% are higher than 180 (goal <25%), and 0% are lower than 70 (goal <4%).  The calculated average blood sugar is 144.  The projected HbA1c for the next 3 months (GMI) is 6.8%. -Reviewing the CGM trends, this appears to be improved, with occasional slightly higher blood sugars after lunch, but more consistently elevated sugars after dinner/snack at night, with a peak between 1-2 AM.  He tells me that he is going to try to eliminate the snack at night.  I strongly recommended this and discussed about many benefits of doing so.  Also, another reason for his blood sugars to be elevated in the first half of the night is likely the fact that he is forgetting to take the NovoLog  before dinner.  We discussed about possibly setting alarms on his phone to do so.  Otherwise, I did not recommend a change in regimen, but I am hoping we can decrease his insulin  doses at next OV.  He did end up starting back on Farxiga, which we will continue for now, as he describes that his dry mouth is now intermittent. - I suggested to:  Patient Instructions  Please continue: - Jardiance  10 mg daily before b'fast - Ozempic  1 mg weekly - Lantus  30 units at bedtime - NovoLog  15 min before meals: 10-12 units before brunch 8-10 units before dinner If you have  to take the suppertime insulin  after the meal, take only up to 5 units.  Try to stop the snack at  night.  Try not to forgot the NovoLog  before dinner.  You can try alpha lipoic acid 200 mg 2x a day for neuropathy.  Also, MagniLife Diabetic neuropathy cream.  Please return in 3-4 months.  - we checked his HbA1c: 6.8% (LOWER) - advised to check sugars at different times of the day - 4x a day, rotating check times - advised for yearly eye exams >> he is UTD - he has twice a year foot exam at the [place].  He is on Neurontin  and ropinirole .  I previously suggested alpha lipoic acid but he did not start this.  I again suggested this along with a diabetic cream.  - return to clinic in 3-4 months  2. HL - Reviewed latest lipid panel from 03/2023: All fractions at goal: Lab Results  Component Value Date   CHOL 116 04/06/2023   HDL 42.20 04/06/2023   LDLCALC 45 04/06/2023   LDLDIRECT 103.0 06/22/2020   TRIG 145.0 04/06/2023   CHOLHDL 3 04/06/2023  - Continues Crestor  20 mg daily without side effects.  Lela Fendt, MD PhD [facility] Endocrinology

## 2024-01-29 NOTE — Telephone Encounter (Signed)
   Name: Mason [name]  DOB: Jan 25, 1941  MRN: [id number]  Primary Cardiologist: Jerel Balding, MD   Preoperative team, please contact this patient and set up a phone call appointment for further preoperative risk assessment. Please obtain consent and complete medication review. Thank you for your help.  I confirm that guidance regarding antiplatelet and oral anticoagulation therapy has been completed and, if necessary, noted below.  Per office protocol, patient can hold Xarelto  for 3 days prior to procedure.     Patient previously approved by Dr. Pietro to hold 3 days. He should resume as soon as safely possible due to stroke risk.    I also confirmed the patient resides in the state of [place] . As per [facility] Medical Board telemedicine laws, the patient must reside in the state in which the provider is licensed.   Mason JAYSON Braver, NP 01/29/2024, 12:01 PM [facility]

## 2024-02-05 ENCOUNTER — Other Ambulatory Visit: Payer: Self-pay | Admitting: Internal Medicine

## 2024-02-06 ENCOUNTER — Ambulatory Visit: Attending: Cardiology | Admitting: Emergency Medicine

## 2024-02-06 DIAGNOSIS — Z01810 Encounter for preprocedural cardiovascular examination: Secondary | ICD-10-CM | POA: Insufficient documentation

## 2024-02-06 NOTE — Progress Notes (Signed)
 Virtual Visit via Telephone Note   Because of AZION CENTRELLA co-morbid illnesses, he is at least at moderate risk for complications without adequate follow up.  This format is felt to be most appropriate for this patient at this time.  Due to technical limitations with video connection (technology), today's appointment will be conducted as an audio only telehealth visit, and AMRON GUERRETTE verbally agreed to proceed in this manner.   All issues noted in this document were discussed and addressed.  No physical exam could be performed with this format.  Evaluation Performed:  Preoperative cardiovascular risk assessment _____________   Date:  02/06/2024   Patient ID:  [name], DOB 1941-02-18, MRN [id number] Patient Location:  Home Provider location:   Office  Primary Care Provider:  Candise Aleene DEL, MD Primary Cardiologist:  Jerel Balding, MD  Chief Complaint / Patient Profile   83 y.o. y/o male with a h/o chronic systolic heart failure, permanent atrial fibrillation, complete heart block, acquired thrombophilia, biventricular automatic implantable defibrillator in situ, hypertension, peripheral arterial disease, type 2 diabetes, dyslipidemia who is pending L2-3, L3-4 lumbar fusion on date TBD with [facility] and presents today for telephonic preoperative cardiovascular risk assessment.  History of Present Illness    DERRILL BAGNELL is a 83 y.o. male who presents via audio/video conferencing for a telehealth visit today.  Pt was last seen in cardiology clinic on 04/12/2023 by Dr. Balding.  At that time TILLMAN KAZMIERSKI was doing well.  The patient is now pending procedure as outlined above. Since his last visit, he denies chest pain, shortness of breath, lower extremity edema, fatigue, palpitations, melena, hematuria, hemoptysis, diaphoresis, weakness, presyncope, syncope, orthopnea, and PND.  Today patient is doing well overall.  He is without acute cardiovascular  concerns or complaints at this time.  He denies any chest pains or dyspnea.  Overall he stays relatively active.  He does yard work, housework, and enjoys taking his pontoon out regularly to go fishing.  Overall he is without any exertional symptoms.  He is able to complete greater than 4 METS without limitation.  Past Medical History    Past Medical History:  Diagnosis Date   AICD (automatic cardioverter/defibrillator) present 2018   MDT CRT-D.  Fatigue-->completely pacer dependent.  Pacer settings adjusted 12/2017 to allow more chronotrophc variance with ADL's//exertion.   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   02/2016 EF 40%, diffuse hypokinesis, grade 2 DD.  Myoc perf imaging showed EF 32% 04/2016.  Pt upgraded to CRT-D 01/04/17.   Chronic renal insufficiency, stage III (moderate) (HCC) 2015   CrCl about 60 ml/min   Complete heart block (HCC)    Has dual chamber pacer.   COVID-19 virus infection 01/05/2021   paxlovid   Depression    DOE (dyspnea on exertion)    NYHA class II/III CHF   Dyspnea 2021   with exertion, bending over   Episodic low back pain 01/22/2013   w/intermittent radiculitis (12/2014 his neurologist referred him to pain mgmt for epidural steroid injection)   Erectile dysfunction 2019   due to zoloft --urol rx'd viagra   GERD (gastroesophageal reflux disease)    H/O tilt table  evaluation 11/02/2005   negative   Helicobacter pylori gastritis 01/2016   History of adenomatous polyp of colon 10/12/2011   Dr. Rollin (3 right side of colon- tubular adenomas removed)   History of cardiovascular stress test 05/28/2012   no ischemia, EF 37%, imaging results are unchanged and within normal  variance   History of chronic prostatitis    History of kidney stones    History of vertigo    + Hx of posterior HA's.  Neuro (Dr. Maurice) eval 2011.  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   Hyperlipidemia    Hypertension    Lumbar spondylosis    lumbosacral radiculopathy at L4 by EMG testing, right foot drop (neurologist is Dr. Birder with [facility] in [place])--neurologist referred him to neurosurgery   Migraine    used to have them all the time; none for years (01/04/2017)   Myocardial infarction [facility]) ?1970s   not entirely certain of this   Nephrolithiasis 07/2012   Left UVJ 2 mm stone with dilation of renal collecting system and slight hydroureter on right   Neuropathy    NICM (nonischemic cardiomyopathy) (HCC)    a. 02/2018 Cath: LM nl, LAD min irregs, LCX no, RCA 20d. [id number]. Fick CO/CI 4.4/2.0.   Osteoarthritis, multiple sites    Shoulders, back, knees   Pacemaker 02/05/2012   dual chamber, complete heart block, meddtronic revo, lasted checked 12/2015.  Since no CAD on cath 05/2016, cards recommends upgrade to CRT-D.   Permanent atrial fibrillation (HCC)    DCCV 07/09/13-converted, lasted two days, then back into afib--needs lifetime anticoagulation (Xarelto  as of 09/2014)   Prostate cancer screening 09/2017   done by urol annually (normal prostate exam documented + PSA 0.84 as of 10/01/17 urol f/u.  10/2018 urol f/u PSA 0.6, no prostate nodule.   Rectus diastasis    Right ankle sprain 08/2017   w/distal fibula avulsion fx noted on u/s but not plain film-(Dr. Hudnall).   Skin cancer of arm, left    burned it off (01/04/2017)   TIA (transient ischemic attack)    L face and L arm weakness. Peri procedural->a. 03/22/2018 following cath. CT head neg. No MRI b/c has pacer. Likely due to embolus to distal branch of RMCA   Type II diabetes mellitus (HCC)    Past Surgical History:  Procedure  Laterality Date   ABI's Bilateral 05/21/2018   normal   BACK SURGERY     BIV ICD INSERTION CRT-D N/A 01/04/2017   Procedure: BiV ICD ;  Surgeon: Inocencio Soyla Lunger, MD;  [facility] INVASIVE CV LAB;  Service: Cardiovascular;  Laterality: N/A;   CARDIAC CATHETERIZATION N/A 06/14/2016   Minimal nonobstructive dz, EF 25-35%.  Procedure: Left Heart Cath and Coronary Angiography;  Surgeon: Peter M Swaziland, MD;  Location: [facility] INVASIVE CV LAB;  Service: Cardiovascular;  Laterality: N/A;   CARDIOVASCULAR STRESS TEST  2012   2012 nuclear perfusion study: low risk scan; 04/2016 normal myocardial perfusion imaging, EF 32%.   CARDIOVERSION  07/09/2012   Procedure: CARDIOVERSION;  Surgeon: Jerel Balding, MD;  Location: [facility] ENDOSCOPY;  Service: Cardiovascular;  Laterality: N/A;   CATARACT EXTRACTION W/ INTRAOCULAR LENS IMPLANT & ANTERIOR VITRECTOMY, BILATERAL Bilateral    CIRCUMCISION N/A 03/23/2020   Procedure: CIRCUMCISION ADULT;  Surgeon: Rosalind Zachary NOVAK, MD;  Location: [facility] ORS;  Service: Urology;  Laterality: N/A;   COLONOSCOPY W/ POLYPECTOMY  approx 2006; repeated 09/2011   Polyps on 2013  EGD as well, repeat 12/2014   COLONOSCOPY WITH PROPOFOL  N/A 07/08/2021   adenoma x 1. Procedure: COLONOSCOPY WITH PROPOFOL ;  Surgeon: Rollin Dover, MD;  Location: [facility] ENDOSCOPY;  Service: Endoscopy;  Laterality: N/A;   ESOPHAGEAL DILATION  10/19/2023   Procedure: DILATION, ESOPHAGUS;  Surgeon: Rollin Dover, MD;  Location: [facility] ENDOSCOPY;  Service: Gastroenterology;;   ESOPHAGOGASTRODUODENOSCOPY  10/18/2006   Done due to chronic GERD: Normal, bx showed no barrett's esophagus (Dr. Rollin)   EYE SURGERY  01/08/2023   FLEXOR TENDON REPAIR Left 10/02/2016   Procedure: LEFT RING FINGER WOUND EXPORATION AND FLEXOR TENDON REPAIR AND NERVE REPAIR;  Surgeon: Alm Hummer, MD;  Location: [facility] OR;  Service: Orthopedics;  Laterality: Left;   INSERT / REPLACE / REMOVE PACEMAKER  02/05/2012   dual chamber, sinus node dysfunction,  sinus arrest, PAF, Medtronic Revo serial#[id number] H: last checked 05/2015   LUMBAR LAMINECTOMY Left 1976   L4-5   PACEMAKER REMOVAL  01/04/2017   PERMANENT PACEMAKER INSERTION N/A 02/05/2012   Procedure: PERMANENT PACEMAKER INSERTION;  Surgeon: Jerel Balding, MD; Generator Medtronic [place] model [place] serial number [id number] H Laterality: N/A;   POLYPECTOMY  07/08/2021   Procedure: POLYPECTOMY;  Surgeon: Rollin Dover, MD;  Location: [facility] ENDOSCOPY;  Service: Endoscopy;;   RETINAL DETACHMENT SURGERY Left ~ 1999   REVERSE SHOULDER ARTHROPLASTY Left 2018   Left shoulder reverse [facility] in [place]).   RIGHT/LEFT HEART CATH AND CORONARY ANGIOGRAPHY N/A 03/22/2018   EF 30-35%, no CAD.  Procedure: RIGHT/LEFT HEART CATH AND CORONARY ANGIOGRAPHY;  Surgeon: Cherrie Toribio SAUNDERS, MD;  Location: [facility] INVASIVE CV LAB;  Service: Cardiovascular;  Laterality: N/A;   SAVORY DILATION N/A 10/19/2023   Procedure: EGD, WITH DILATION USING SAVARY-GILLIARD DILATOR OVER GUIDEWIRE;  Surgeon: Rollin Dover, MD;  Location: [facility];  Service: Gastroenterology;  Laterality: N/A;   TRANSTHORACIC ECHOCARDIOGRAM  08/25/10; 05/2012; 03/23/16;12/2017   mild asymmetric LVH, normal systolic function, normal diastolic fxn, mild-to-mod mitral regurg, mild aortic valve sclerosis and trace AI, mild aortic root dilatation. 2014 f/u showed EF 40-45%, mod LAE, A FIB.  02/2016 EF 40%, diffuse hypokinesis, grade 2 DD. 12/2017 EF 35-40%,diffuse hypokin,grd III DD, mild MR   WISDOM TOOTH EXTRACTION  06/27/2021   EF 50-55%, mild aortic root/ascending aorta dilatation (41-42 mm).    Allergies  No Known Allergies  Home Medications    Prior to Admission medications   Medication Sig Start Date End Date Taking? Authorizing Provider  BD INSULIN  SYRINGE U/F 31G X 5/16 1 ML MISC USE DAILY AS DIRECTED 01/12/23   McGowen, Aleene DEL, MD  COMBIGAN  0.2-0.5 % ophthalmic solution Place 1 drop into both eyes at bedtime.  04/19/16   [provider]  Continuous Blood Gluc Receiver (FREESTYLE LIBRE 2 READER) DEVI 1 each by Does not apply route daily. 05/31/22   Trixie File, MD  Continuous Blood Gluc Sensor (FREESTYLE LIBRE 2 SENSOR) MISC 1 each by Does not apply route every 14 (fourteen) days. 09/14/22   Trixie File, MD  cyclobenzaprine  (FLEXERIL ) 5 MG tablet Take 1 tablet (5 mg total) by mouth 2 (two) times daily as needed for muscle spasms. 08/08/23   Davis, Jonathon H, MD  diclofenac  Sodium (VOLTAREN ) 1 % GEL Apply 4 g topically 4 (four) times daily. 12/17/20   McGowen, Philip H, MD  ENTRESTO  97-103 MG TAKE 1 TABLET TWO TIMES A DAY 06/01/23   Croitoru, Mihai, MD  eplerenone  (INSPRA ) 25 MG tablet TAKE 1 TABLET DAILY 03/27/23   Croitoru, Jerel, MD  gabapentin  (NEURONTIN ) 300 MG capsule TAKE 2 CAPSULES TWICE A DAY 07/02/23   McGowen, Aleene DEL, MD  insulin  aspart (NOVOLOG  FLEXPEN) 100 UNIT/ML FlexPen Inject 10 units before each meal Patient taking differently: Inject 12 units before each meal 03/07/23   Trixie File, MD  insulin  glargine (LANTUS  SOLOSTAR) 100 UNIT/ML Solostar Pen Inject 30-34 Units into the skin daily. 01/29/24   Trixie File, MD  JARDIANCE  10 MG TABS tablet TAKE 1 TABLET DAILY 02/05/24   Trixie File, MD  ketoconazole (NIZORAL) 2 % cream Apply 1 Application topically daily. 11/20/22   [provider]  ketorolac  (ACULAR ) 0.5 % ophthalmic solution  04/03/22   [provider]  lidocaine  (LIDODERM ) 5 % PLACE 1 PATCH ON THE SKIN DAILY. REMOVE AND DISCARD PATCH WITHIN 12 HOURS OR AS DIRECTED BY DOCTOR 10/19/23   McGowen, Aleene DEL, MD  meclizine  (ANTIVERT ) 25 MG tablet TAKE 1 TABLET THREE TIMES A DAY AS NEEDED FOR DIZZINESS 07/02/23   McGowen, Aleene DEL, MD  metoprolol  succinate (TOPROL -XL) 50 MG 24 hr tablet TAKE 1 TABLET DAILY. TAKE WITH OR IMMEDIATELY FOLLLOWING A MEAL 03/12/23   McGowen, Aleene DEL, MD  naloxone  (NARCAN ) nasal spray 4 mg/0.1 mL 1 spray into nostril as needed for opioid  overdose.  Repeat in 3 min if no improvement 04/10/23   McGowen, Aleene DEL, MD  omeprazole  (PRILOSEC) 40 MG capsule 1 cap po bid 12/04/22   McGowen, Aleene DEL, MD  oxybutynin  (DITROPAN -XL) 10 MG 24 hr tablet TAKE 1 TABLET AT BEDTIME 02/21/23   McGowen, Aleene DEL, MD  oxyCODONE -acetaminophen  (PERCOCET) 10-325 MG tablet 1-2 tabs po tid prn pain 04/06/23   McGowen, Aleene DEL, MD  ROCKLATAN 0.02-0.005 % SOLN Apply to eye. 09/03/22   [provider]  rOPINIRole  (REQUIP ) 1 MG tablet Take 1 tablet (1 mg total) by mouth daily. 12/11/23   McGowen, Aleene DEL, MD  rosuvastatin  (CRESTOR ) 20 MG tablet TAKE 1 TABLET DAILY 02/19/23   McGowen, Aleene DEL, MD  Semaglutide , 1 MG/DOSE, 4 MG/3ML SOPN Inject 1 mg as directed once a week. 10/11/23   Trixie File, MD  sertraline  (ZOLOFT ) 100 MG tablet TAKE 1 TABLET DAILY 08/21/23   McGowen, Aleene DEL, MD  SURE COMFORT PEN NEEDLES 31G X 5 MM MISC USE TO INJECT INSULIN  UNDER THE SKIN 07/02/23   McGowen, Aleene DEL, MD  XARELTO  20 MG TABS tablet TAKE 1 TABLET DAILY WITH SUPPER 09/25/23   Croitoru, Jerel, MD    Physical Exam    Vital Signs:  ELSIE SAKUMA does not have vital signs available for review today.  Given telephonic nature of communication, physical exam is limited. AAOx3. NAD. Normal affect.  Speech and respirations are unlabored.  Accessory Clinical Findings    None  Assessment & Plan    1.  Preoperative Cardiovascular Risk Assessment: According to the Revised Cardiac Risk Index (RCRI), his Perioperative Risk of Major Cardiac Event is (%): 6.6. His Functional Capacity in METs is: 6.36 according to the Duke Activity Status Index (DASI). Therefore, based on ACC/AHA guidelines, patient would be at acceptable risk for the planned procedure without further cardiovascular testing. I will route this recommendation to the requesting party via Epic fax function.  The patient was advised that if he develops new symptoms prior to surgery to contact our office to arrange  for a follow-up visit, and he verbalized understanding.  Per office protocol, patient can hold Xarelto  for 3 days prior to procedure.     Patient previously approved by  Dr. Pietro to hold 3 days. He should resume as soon as safely possible due to stroke risk.  A copy of this note will be routed to requesting surgeon.  Time:   Today, I have spent 9 minutes with the patient with telehealth technology discussing medical history, symptoms, and management plan.     Lum LITTIE Louis, NP  02/06/2024, 9:14 AM

## 2024-02-07 ENCOUNTER — Other Ambulatory Visit: Payer: Self-pay | Admitting: Neurosurgery

## 2024-02-12 ENCOUNTER — Other Ambulatory Visit: Payer: Self-pay

## 2024-02-12 MED ORDER — OXYBUTYNIN CHLORIDE ER 10 MG PO TB24: 10.0000 mg | ORAL_TABLET | Freq: Every day | ORAL | 0 refills | Status: DC

## 2024-02-14 ENCOUNTER — Encounter: Payer: Self-pay | Admitting: Cardiovascular Disease

## 2024-02-14 NOTE — Progress Notes (Signed)
 PERIOPERATIVE PRESCRIPTION FOR IMPLANTED CARDIAC DEVICE PROGRAMMING  Patient Information: Name:  Mason [name]  DOB:  1941/03/01  MRN:  [id number]    Planned Procedure:  Extreme Lateral Interbody Fusion L3-L4, L4-L5  Surgeon:  Dr. Victory Gunnels  Date of Procedure:  02/25/2024  Cautery will be used.  Position during surgery:  Prone   Please send documentation back to:  [facility] (Fax # [phone])   Device Information:  Clinic EP Physician:  Dr. Nicky Croitoru   Device Type:  Defibrillator Manufacturer and Phone #:  Medtronic: [id number] Pacemaker Dependent?:  Unknown Date of Last Device Check:  02/12/2024  Normal Device Function?:  Yes.    Electrophysiologist's Recommendations:  Have magnet available. Provide continuous ECG monitoring when magnet is used or reprogramming is to be performed.  Procedure will likely interfere with device function.  Device should be programmed:  Tachy therapies disabled  Per Device Clinic Standing Orders, [name], RN  3:39 PM 02/14/2024

## 2024-02-14 NOTE — Pre-Procedure Instructions (Signed)
 Surgical Instructions   Your procedure is scheduled on February 25, 2024. Report to [facility] Main Entrance A at 6:00 A.M., then check in with the Admitting office. Any questions or running late day of surgery: call [phone]  Questions prior to your surgery date: call [phone], Monday-Friday, 8am-4pm. If you experience any cold or flu symptoms such as cough, fever, chills, shortness of breath, etc. between now and your scheduled surgery, please notify us  at the above number.     Remember:  Do not eat or drink after midnight the night before your surgery   Take these medicines the morning of surgery with A SIP OF WATER: COMBIGAN  ophthalmic solution  gabapentin  (NEURONTIN )  meclizine  (ANTIVERT )  metoprolol  succinate (TOPROL -XL)  omeprazole  (PRILOSEC)  sertraline  (ZOLOFT )    May take these medicines IF NEEDED: oxyCODONE -acetaminophen  (PERCOCET)    Follow your surgeon's instructions on when to stop XARELTO .  If no instructions were given by your surgeon then you will need to call the office to get those instructions.     One week prior to surgery, STOP taking any Aspirin  (unless otherwise instructed by your surgeon) Aleve, Naproxen, Ibuprofen, Motrin, Advil, Goody's, BC's, all herbal medications, fish oil, and non-prescription vitamins. This includes your medication: diclofenac  Sodium (VOLTAREN ) GEL    WHAT DO I DO ABOUT MY DIABETES MEDICATION?   STOP taking your JARDIANCE  three days prior to surgery. Your last dose will be July 31st.  STOP taking your Semaglutide  one week prior to surgery. DO NOT take any doses after July 27th.  THE NIGHT BEFORE SURGERY, take 13 units of insulin  glargine (LANTUS  SOLOSTAR).      Morning of surgery, if your CBG is greater than 220 mg/dL, you may take  your normal dose of insulin  aspart (NOVOLOG  FLEXPEN).   HOW TO MANAGE YOUR DIABETES BEFORE AND AFTER SURGERY  Why is it important to control my blood sugar before and after  surgery? Improving blood sugar levels before and after surgery helps healing and can limit problems. A way of improving blood sugar control is eating a healthy diet by:  Eating less sugar and carbohydrates  Increasing activity/exercise  Talking with your doctor about reaching your blood sugar goals High blood sugars (greater than 180 mg/dL) can raise your risk of infections and slow your recovery, so you will need to focus on controlling your diabetes during the weeks before surgery. Make sure that the doctor who takes care of your diabetes knows about your planned surgery including the date and location.  How do I manage my blood sugar before surgery? Check your blood sugar at least 4 times a day, starting 2 days before surgery, to make sure that the level is not too high or low.  Check your blood sugar the morning of your surgery when you wake up and every 2 hours until you get to the Short Stay unit.  If your blood sugar is less than 70 mg/dL, you will need to treat for low blood sugar: Do not take insulin . Treat a low blood sugar (less than 70 mg/dL) with  cup of clear juice (cranberry or apple), 4 glucose tablets, OR glucose gel. Recheck blood sugar in 15 minutes after treatment (to make sure it is greater than 70 mg/dL). If your blood sugar is not greater than 70 mg/dL on recheck, call [phone] for further instructions. Report your blood sugar to the short stay nurse when you get to Short Stay.  If you are admitted to the hospital after surgery:  Your blood sugar will be checked by the staff and you will probably be given insulin  after surgery (instead of oral diabetes medicines) to make sure you have good blood sugar levels. The goal for blood sugar control after surgery is 80-180 mg/dL.                      Do NOT Smoke (Tobacco/Vaping) for 24 hours prior to your procedure.  If you use a CPAP at night, you may bring your mask/headgear for your overnight stay.   You will be  asked to remove any contacts, glasses, piercing's, hearing aid's, dentures/partials prior to surgery. Please bring cases for these items if needed.    Patients discharged the day of surgery will not be allowed to drive home, and someone needs to stay with them for 24 hours.  SURGICAL WAITING ROOM VISITATION Patients may have no more than 2 support people in the waiting area - these visitors may rotate.   Pre-op nurse will coordinate an appropriate time for 1 ADULT support person, who may not rotate, to accompany patient in pre-op.  Children under the age of 33 must have an adult with them who is not the patient and must remain in the main waiting area with an adult.  If the patient needs to stay at the hospital during part of their recovery, the visitor guidelines for inpatient rooms apply.  Please refer to the [facility] website for the visitor guidelines for any additional information.   If you received a COVID test during your pre-op visit  it is requested that you wear a mask when out in public, stay away from anyone that may not be feeling well and notify your surgeon if you develop symptoms. If you have been in contact with anyone that has tested positive in the last 10 days please notify you surgeon.      Pre-operative 5 CHG Bathing Instructions   You can play a key role in reducing the risk of infection after surgery. Your skin needs to be as free of germs as possible. You can reduce the number of germs on your skin by washing with CHG (chlorhexidine  gluconate) soap before surgery. CHG is an antiseptic soap that kills germs and continues to kill germs even after washing.   DO NOT use if you have an allergy to chlorhexidine /CHG or antibacterial soaps. If your skin becomes reddened or irritated, stop using the CHG and notify one of our RNs at [phone].   Please shower with the CHG soap starting 4 days before surgery using the following schedule:     Please keep in mind the  following:  DO NOT shave, including legs and underarms, starting the day of your first shower.   You may shave your face at any point before/day of surgery.  Place clean sheets on your bed the day you start using CHG soap. Use a clean washcloth (not used since being washed) for each shower. DO NOT sleep with pets once you start using the CHG.   CHG Shower Instructions:  Wash your face and private area with normal soap. If you choose to wash your hair, wash first with your normal shampoo.  After you use shampoo/soap, rinse your hair and body thoroughly to remove shampoo/soap residue.  Turn the water OFF and apply about 3 tablespoons (45 ml) of CHG soap to a CLEAN washcloth.  Apply CHG soap ONLY FROM YOUR NECK DOWN TO YOUR TOES (washing for 3-5 minutes)  DO NOT use CHG soap on face, private areas, open wounds, or sores.  Pay special attention to the area where your surgery is being performed.  If you are having back surgery, having someone wash your back for you may be helpful. Wait 2 minutes after CHG soap is applied, then you may rinse off the CHG soap.  Pat dry with a clean towel  Put on clean clothes/pajamas   If you choose to wear lotion, please use ONLY the CHG-compatible lotions that are listed below.  Additional instructions for the day of surgery: DO NOT APPLY any lotions, deodorants, cologne, or perfumes.   Do not bring valuables to the hospital. [facility] is not responsible for any belongings/valuables. Do not wear nail polish, gel polish, artificial nails, or any other type of covering on natural nails (fingers and toes) Do not wear jewelry or makeup Put on clean/comfortable clothes.  Please brush your teeth.  Ask your nurse before applying any prescription medications to the skin.     CHG Compatible Lotions   Aveeno Moisturizing lotion  Cetaphil Moisturizing Cream  Cetaphil Moisturizing Lotion  Clairol Herbal Essence Moisturizing Lotion, Dry Skin  Clairol Herbal  Essence Moisturizing Lotion, Extra Dry Skin  Clairol Herbal Essence Moisturizing Lotion, Normal Skin  Curel Age Defying Therapeutic Moisturizing Lotion with Alpha Hydroxy  Curel Extreme Care Body Lotion  Curel Soothing Hands Moisturizing Hand Lotion  Curel Therapeutic Moisturizing Cream, Fragrance-Free  Curel Therapeutic Moisturizing Lotion, Fragrance-Free  Curel Therapeutic Moisturizing Lotion, Original Formula  Eucerin Daily Replenishing Lotion  Eucerin Dry Skin Therapy Plus Alpha Hydroxy Crme  Eucerin Dry Skin Therapy Plus Alpha Hydroxy Lotion  Eucerin Original Crme  Eucerin Original Lotion  Eucerin Plus Crme Eucerin Plus Lotion  Eucerin TriLipid Replenishing Lotion  Keri Anti-Bacterial Hand Lotion  Keri Deep Conditioning Original Lotion Dry Skin Formula Softly Scented  Keri Deep Conditioning Original Lotion, Fragrance Free Sensitive Skin Formula  Keri Lotion Fast Absorbing Fragrance Free Sensitive Skin Formula  Keri Lotion Fast Absorbing Softly Scented Dry Skin Formula  Keri Original Lotion  Keri Skin Renewal Lotion Keri Silky Smooth Lotion  Keri Silky Smooth Sensitive Skin Lotion  Nivea Body Creamy Conditioning Oil  Nivea Body Extra Enriched Lotion  Nivea Body Original Lotion  Nivea Body Sheer Moisturizing Lotion Nivea Crme  Nivea Skin Firming Lotion  NutraDerm 30 Skin Lotion  NutraDerm Skin Lotion  NutraDerm Therapeutic Skin Cream  NutraDerm Therapeutic Skin Lotion  ProShield Protective Hand Cream  Provon moisturizing lotion  Please read over the following fact sheets that you were given.

## 2024-02-15 ENCOUNTER — Other Ambulatory Visit: Payer: Self-pay

## 2024-02-15 ENCOUNTER — Encounter (HOSPITAL_COMMUNITY)
Admission: RE | Admit: 2024-02-15 | Discharge: 2024-02-15 | Disposition: A | Discharge status: Home / Self Care | Source: Ambulatory Visit | Attending: Neurosurgery | Admitting: Neurosurgery

## 2024-02-15 ENCOUNTER — Encounter (HOSPITAL_COMMUNITY): Payer: Self-pay

## 2024-02-15 VITALS — BP 94/61 | HR 78 | Temp 98.3°F | Resp 17 | Ht 71.0 in | Wt 216.0 lb

## 2024-02-15 DIAGNOSIS — N183 Chronic kidney disease, stage 3 unspecified: Secondary | ICD-10-CM | POA: Insufficient documentation

## 2024-02-15 DIAGNOSIS — K219 Gastro-esophageal reflux disease without esophagitis: Secondary | ICD-10-CM | POA: Insufficient documentation

## 2024-02-15 DIAGNOSIS — R0609 Other forms of dyspnea: Secondary | ICD-10-CM | POA: Insufficient documentation

## 2024-02-15 DIAGNOSIS — Z95810 Presence of automatic (implantable) cardiac defibrillator: Secondary | ICD-10-CM | POA: Diagnosis not present

## 2024-02-15 DIAGNOSIS — Z794 Long term (current) use of insulin: Secondary | ICD-10-CM | POA: Diagnosis not present

## 2024-02-15 DIAGNOSIS — Z7984 Long term (current) use of oral hypoglycemic drugs: Secondary | ICD-10-CM | POA: Diagnosis not present

## 2024-02-15 DIAGNOSIS — Z8673 Personal history of transient ischemic attack (TIA), and cerebral infarction without residual deficits: Secondary | ICD-10-CM | POA: Diagnosis not present

## 2024-02-15 DIAGNOSIS — N400 Enlarged prostate without lower urinary tract symptoms: Secondary | ICD-10-CM | POA: Insufficient documentation

## 2024-02-15 DIAGNOSIS — I4821 Permanent atrial fibrillation: Secondary | ICD-10-CM | POA: Diagnosis not present

## 2024-02-15 DIAGNOSIS — E1122 Type 2 diabetes mellitus with diabetic chronic kidney disease: Secondary | ICD-10-CM | POA: Diagnosis not present

## 2024-02-15 DIAGNOSIS — I130 Hypertensive heart and chronic kidney disease with heart failure and stage 1 through stage 4 chronic kidney disease, or unspecified chronic kidney disease: Secondary | ICD-10-CM | POA: Insufficient documentation

## 2024-02-15 DIAGNOSIS — I7121 Aneurysm of the ascending aorta, without rupture: Secondary | ICD-10-CM | POA: Diagnosis not present

## 2024-02-15 DIAGNOSIS — E119 Type 2 diabetes mellitus without complications: Secondary | ICD-10-CM

## 2024-02-15 DIAGNOSIS — E1151 Type 2 diabetes mellitus with diabetic peripheral angiopathy without gangrene: Secondary | ICD-10-CM | POA: Diagnosis not present

## 2024-02-15 DIAGNOSIS — M5416 Radiculopathy, lumbar region: Secondary | ICD-10-CM | POA: Diagnosis not present

## 2024-02-15 DIAGNOSIS — I5042 Chronic combined systolic (congestive) and diastolic (congestive) heart failure: Secondary | ICD-10-CM | POA: Insufficient documentation

## 2024-02-15 DIAGNOSIS — Z01818 Encounter for other preprocedural examination: Secondary | ICD-10-CM

## 2024-02-15 DIAGNOSIS — Z01812 Encounter for preprocedural laboratory examination: Secondary | ICD-10-CM | POA: Insufficient documentation

## 2024-02-15 DIAGNOSIS — I428 Other cardiomyopathies: Secondary | ICD-10-CM | POA: Diagnosis not present

## 2024-02-15 HISTORY — DX: Peripheral vascular disease, unspecified: I73.9

## 2024-02-15 LAB — CBC
HCT: 51.4 % (ref 39.0–52.0)
Hemoglobin: 17.1 g/dL — ABNORMAL HIGH (ref 13.0–17.0)
MCH: 30.5 pg (ref 26.0–34.0)
MCHC: 33.3 g/dL (ref 30.0–36.0)
MCV: 91.6 fL (ref 80.0–100.0)
Platelets: 133 K/uL — ABNORMAL LOW (ref 150–400)
RBC: 5.61 MIL/uL (ref 4.22–5.81)
RDW: 12.8 % (ref 11.5–15.5)
WBC: 6.9 K/uL (ref 4.0–10.5)
nRBC: 0 % (ref 0.0–0.2)

## 2024-02-15 LAB — BASIC METABOLIC PANEL WITH GFR
Anion gap: 9 (ref 5–15)
BUN: 28 mg/dL — ABNORMAL HIGH (ref 8–23)
CO2: 26 mmol/L (ref 22–32)
Calcium: 9.3 mg/dL (ref 8.9–10.3)
Chloride: 101 mmol/L (ref 98–111)
Creatinine, Ser: 1.48 mg/dL — ABNORMAL HIGH (ref 0.61–1.24)
GFR, Estimated: 47 mL/min — ABNORMAL LOW (ref >60)
Glucose, Bld: 132 mg/dL — ABNORMAL HIGH (ref 70–99)
Potassium: 4.5 mmol/L (ref 3.5–5.1)
Sodium: 136 mmol/L (ref 135–145)

## 2024-02-15 LAB — SURGICAL PCR SCREEN
MRSA, PCR: NEGATIVE
Staphylococcus aureus: POSITIVE — AB

## 2024-02-15 LAB — GLUCOSE, CAPILLARY: Glucose-Capillary: 161 mg/dL — ABNORMAL HIGH (ref 70–99)

## 2024-02-15 LAB — TYPE AND SCREEN
ABO/RH(D): B POS
Antibody Screen: NEGATIVE

## 2024-02-15 NOTE — Progress Notes (Signed)
 PCP - Dr. Aleene HILARIO Hockey Cardiologist - Dr. Jerel Balding - Last office visit 04/12/2023  PPM/ICD - [employer] PPM/ICD Device Orders - Device Orders requested and received 02/14/24 Rep Notified - [employer] Rep emailed 7/25 with device recommendations  Chest x-ray - 08/08/2023 EKG - 08/08/2023 Stress Test - 04/10/2018 ECHO - 06/29/2021 Cardiac Cath - 03/22/2018  Sleep Study - Denies CPAP - n/a  Pt is DM2. He has a Libre CGM on his left arm and checks his blood sugar multiple times per day. Normal fasting glucose 110-160. CBG at pre-op appointment 161. Last A1c 6.8 on 01/29/24  Last dose of GLP1 agonist-  Pts last dose of Ozempic  was sometime prior to today's PAT appointment. GLP1 instructions: Pt instructed to not take any doses after July 27th.  Blood Thinner Instructions: Pt instructed by surgeon to hold for three days. Last dose will be July 31st Aspirin  Instructions: n/a  NPO after midnight  COVID TEST- n/a   Anesthesia review: Yes. Cardiac clearance  Patient denies shortness of breath, fever, cough and chest pain at PAT appointment. Pt denies any respiratory illness/infection in the last two months.   All instructions explained to the patient, with a verbal understanding of the material. Patient agrees to go over the instructions while at home for a better understanding. Patient also instructed to self quarantine after being tested for COVID-19. The opportunity to ask questions was provided.

## 2024-02-18 NOTE — Anesthesia Preprocedure Evaluation (Addendum)
 Anesthesia Evaluation  Patient identified by MRN, date of birth, ID band Patient awake    Reviewed: Allergy & Precautions, NPO status , Patient's Chart, lab work & pertinent test results, reviewed documented beta blocker date and time   History of Anesthesia Complications Negative for: history of anesthetic complications  Airway Mallampati: II  TM Distance: >3 FB     Dental no notable dental hx.    Pulmonary shortness of breath, neg COPD   breath sounds clear to auscultation       Cardiovascular hypertension, + Past MI, + Peripheral Vascular Disease, +CHF and + DOE  + dysrhythmias + pacemaker + Cardiac Defibrillator  Rhythm:Regular Rate:Normal  IMPRESSIONS     1. Left ventricular ejection fraction, by estimation, is 50 to 55%. Left  ventricular ejection fraction by 3D volume is 53 %. The left ventricle has  low normal function. The left ventricle has no regional wall motion  abnormalities. Left ventricular  diastolic parameters are indeterminate. The average left ventricular  global longitudinal strain is -9.1 %. The global longitudinal strain is  abnormal.   2. Right ventricular systolic function is mildly reduced. The right  ventricular size is mildly enlarged.   3. Left atrial size was severely dilated.   4. Right atrial size was severely dilated.   5. The mitral valve is normal in structure. Mild mitral valve  regurgitation.   6. The aortic valve is tricuspid. Aortic valve regurgitation is trivial.   7. Aortic dilatation noted. 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 There is mild dilatation of  the ascending aorta, measuring 41 mm.      Neuro/Psych  Headaches, neg Seizures PSYCHIATRIC DISORDERS  Depression    TIA Neuromuscular disease    GI/Hepatic ,GERD  ,,(+) neg Cirrhosis        Endo/Other  diabetes, Type 2    Renal/GU CRFRenal disease      Musculoskeletal  (+) Arthritis , Osteoarthritis,    Abdominal   Peds  Hematology   Anesthesia Other Findings   Reproductive/Obstetrics                              Anesthesia Physical Anesthesia Plan  ASA: 3  Anesthesia Plan: General   Post-op Pain Management:    Induction: Intravenous  PONV Risk Score and Plan: 2 and Ondansetron  and Dexamethasone   Airway Management Planned: Oral ETT  Additional Equipment:   Intra-op Plan:   Post-operative Plan: Extubation in OR  Informed Consent: I have reviewed the patients History and Physical, chart, labs and discussed the procedure including the risks, benefits and alternatives for the proposed anesthesia with the patient or authorized representative who has indicated his/her understanding and acceptance.     Dental advisory given  Plan Discussed with:   Anesthesia Plan Comments: (PAT note written 02/18/2024 by Allison Zelenak, PA-C.  )         Anesthesia Quick Evaluation

## 2024-02-18 NOTE — Progress Notes (Signed)
 Anesthesia Chart Review:  Case: [id number] Date/Time: 02/25/24 0745   Procedure: ANTERIOR LATERAL LUMBAR FUSION WITH PERCUTANEOUS SCREW 2 LEVEL (Left) - XLIF - left - L2-L3 - L3-L4 with perc pedicle screws - Posterior Lateral and Interbody fusion   Anesthesia type: General   Diagnosis: Lumbar radiculopathy [M54.16]   Pre-op diagnosis: Lumbar radiculopathy   [facility] / [facility]   Surgeons: Louis Shove, MD       DISCUSSION: Patient is an 83 year old male scheduled for the above procedure.  History includes never smoker, HTN, PAF (s/p DCCV 07/09/12), nonischemic cardiomyopathy (EF 40-45% 05/31/12; minimal CAD, EF 30-35% 03/22/18), chronic combined systolic and diastolic CHF, CHB (s/p dual chamber Medtronic Revo PPM 02/05/12-->upgraded to Medtronic BiV ICD/CRT-D 01/04/17 due to LV dysfunction; he is pacer dependent as of 04/12/23 OV), DOE, DM2, CKD (stage 3), dyspnea, GERD, H. 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   He is followed by cardiology for NICM, CRT-D. Per Dr. Tyrone note, The cause of Don's cardiomyopathy remains uncertain. He initially presented with a heart attack and was [facility] for 3 to 4 days in 1976. He has had a depressed left ventricular systolic function for many years. Cardiac catheterization most recently performed in 2019 does show nonobstructive atherosclerosis. He has a Theatre manager including 2 stints in [place] and he knows that he was exposed to [employer]....  A dual chamber Medtronic PPM was placed in 2013. He had deterioration of his LV function and was upgraded to CRT-D in 2018. He had minimal CAD by 02/2018 cardiac cath. Most recent TTE on 08/30/20 showed LVEF 50-55% (improved fro m35-40%), no RWMA, mildly reduced RV systolic function, mild RV H, severely dilated LA/RA, mild MR, trivial AR, 42 mm ascending aorta, 42 mm aortic root,  41 mm ascending aorta.   Preoperative cardiology input outlined by Rana Dixon, NP on 02/06/2024, According to the Revised Cardiac Risk Index (RCRI), his Perioperative Risk of Major Cardiac Event is (%): 6.6. His Functional Capacity in METs is: 6.36 according to the Duke Activity Status Index (DASI). Therefore, based on ACC/AHA guidelines, patient would be at acceptable risk for the planned procedure without further cardiovascular testing.SABRASABRASABRAPer office protocol, patient can hold Xarelto  for 3 days prior to procedure... Last dose planned for 02/21/2024.    EP Perioperative ICD recommendations: Device Information: Clinic EP Physician:  Dr. Nicky Croitoru  Device Type:  Defibrillator Manufacturer and Phone #:  Medtronic: [phone] Pacemaker Dependent?:  Unknown Date of Last Device Check:  02/12/2024      Normal Device Function?:  Yes.     Electrophysiologist's Recommendations:  Have magnet available. Provide continuous ECG monitoring when magnet is used or reprogramming is to be performed.  Procedure will likely interfere with device function.  Device should be programmed:  Tachy therapies disabled    A1c 6.8% on 01/29/24.  He is on Jardiance , Ozempic , Lantus , NovoLog  SSI.  He wears a Libre CGM. His normal fasting glucose ~ 110-160.  Ozempic  was on hold prior to 02/17/24 for surgery.  Last Jardiance  plan for 02/21/24.   Anesthesia team to evaluate on the day of surgery.   VS: BP 94/61   Pulse 78   Temp 36.8 C   Resp 17   Ht 5' 11 (1.803 m)   Wt 98 kg   SpO2 99%   BMI 30.13 kg/m  PROVIDERS: Candise Aleene DEL, MD is PCP Francyne Headland, MD is cardiologist Inocencio Chi, MD is EP cardiologist Selma Cough, MD is urologist  Trixie File, MD is endocrinologist [facility].  Last evaluated by Redell Dames, Myron, PA-C on 01/01/2024 for ascending TAA which was stable at 4.2 cm by CTA.  1 year follow-up recommended.   LABS: Preoperative labs  noted.  Creatinine 1.48, previously 1.40 on 08/08/23 in [facility]. PLT 133, previously 123K on 08/08/23.  (all labs ordered are listed, but only abnormal results are displayed)  Labs Reviewed  SURGICAL PCR SCREEN - Abnormal; Notable for the following components:      Result Value   Staphylococcus aureus POSITIVE (*)    All other components within normal limits  BASIC METABOLIC PANEL WITH GFR - Abnormal; Notable for the following components:   Glucose, Bld 132 (*)    BUN 28 (*)    Creatinine, Ser 1.48 (*)    GFR, Estimated 47 (*)    All other components within normal limits  CBC - Abnormal; Notable for the following components:   Hemoglobin 17.1 (*)    Platelets 133 (*)    All other components within normal limits  GLUCOSE, CAPILLARY - Abnormal; Notable for the following components:   Glucose-Capillary 161 (*)    All other components within normal limits  TYPE AND SCREEN   A1c 6.8% on 01/29/24.    OTHER: EGD 11/29/2023: IMPRESSION: - No endoscopic esophageal abnormality to explain patient's dysphagia.  Esophagus dilated. - Normal stomach. - Normal examined duodenum. - No specimens collected.   IMAGES: CTA Chest 12/25/23: IMPRESSION: *Minimal aneurysmal dilatation of the ascending aorta measuring 4.2 x 4.0 cm. *No evidence of pulmonary embolism. *No acute cardiopulmonary disease.  CTA Head/Neck 08/08/23: IMPRESSION: 1. No hemorrhage or CT evidence of an acute cortical infarct. 2. No intracranial large vessel occlusion. 3. 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 4. Approximately 40% narrowing in the proximal right ICA secondary to soft atherosclerotic plaque.   CXR 08/08/23: FINDINGS: Bilateral lung fields are clear. Bilateral costophrenic angles are clear. Normal cardio-mediastinal silhouette. There is a left sided 4-lead pacemaker. No acute osseous abnormalities. Note is made of left shoulder arthroplasty. The soft tissues are within normal  limits. IMPRESSION: No active cardiopulmonary disease.  CT L-spine 04/27/23: IMPRESSION: 1. Unchanged diffuse lumbar disc and facet degeneration. 2. Mild spinal stenosis and moderate left lateral recess stenosis at L3-4. 3. Moderate to severe left lateral recess stenosis and mild-to-moderate left neural foraminal stenosis at L2-3. 4. Mild neural foraminal stenosis at L4-5 and L5-S1. 5.  Aortic Atherosclerosis (ICD10-I70.0).   EKG: 08/08/23: Ventricular-paced rhythm Abnormal ECG When compared with ECG of 12-Apr-2023 09:47, PREVIOUS ECG IS PRESENT Confirmed by Nicholaus Dolphin [phone]) on 08/08/2023 12:09:44 PM   CV: Echo 06/29/21:  IMPRESSIONS   1. Left ventricular ejection fraction, by estimation, is 50 to 55%. Left  ventricular ejection fraction by 3D volume is 53 %. The left ventricle has  low normal function. The left ventricle has no regional wall motion  abnormalities. Left ventricular  diastolic parameters are indeterminate. The average left ventricular  global longitudinal strain is -9.1 %. The global longitudinal strain is  abnormal.   2. Right ventricular systolic function is mildly reduced. The right  ventricular size is mildly enlarged.   3. Left atrial size was severely dilated.   4. Right atrial size was severely dilated.   5. The mitral valve is normal in structure. Mild mitral  valve  regurgitation.   6. The aortic valve is tricuspid. Aortic valve regurgitation is trivial.   7. Aortic dilatation noted. 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 There is mild dilatation of  the ascending aorta, measuring 41 mm.  - Comparison(s): Prior images reviewed side by side. The left ventricular  function has improved.  - Comparison: LVEF 35-40%, diffuse LV hypokinesis, grade 3 DD, mild MR /TR 01/16/18   US  Carotid 03/23/2018: Final Interpretation:  - Right Carotid: The extracranial vessels were near-normal with only minimal   wall thickening or plaque.  - Left Carotid: The extracranial vessels were near-normal with only minimal  wall thickening or plaque.  - Vertebrals:  Bilateral vertebral arteries demonstrate antegrade flow.  - Subclavians: Normal flow hemodynamics were seen in bilateral subclavian               arteries.    RHC/LHC 03/22/2018: Ao = 138/85 (107) LV = 133/16 RA = 7 RV = 33/8 PA = 30/10 (22) PCW = 13 Fick cardiac output/index = 4.4/2.0 PVR = 2.0 WU Ao sat = 98% PA sat = 62%, 62%  Assessment/Plan: 1. Minimal CAD 2. 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 Well-compensated filling pressures with moderately reduced CO 4. Complicated radial access bilaterally as described above. Suggest femoral approach for any further catheterizations    Past Medical History:  Diagnosis Date   AICD (automatic cardioverter/defibrillator) present 2018   MDT CRT-D.  Fatigue-->completely pacer dependent.  Pacer settings adjusted 12/2017 to allow more chronotrophc variance with ADL's//exertion.   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   02/2016 EF 40%, diffuse hypokinesis, grade 2 DD.  Myoc perf imaging showed EF 32% 04/2016.  Pt upgraded to CRT-D 01/04/17.   Chronic renal insufficiency, stage III (moderate) (HCC) 2015   CrCl about 60 ml/min   Complete heart block (HCC)    Has dual chamber pacer.   COVID-19 virus infection 01/05/2021   paxlovid   Depression    DOE (dyspnea on exertion)    NYHA class II/III CHF   Dyspnea 2021   with exertion, bending over   Dysrhythmia    A. Fib   Episodic low back pain 01/22/2013   w/intermittent radiculitis (12/2014 his neurologist referred him to  pain mgmt for epidural steroid injection)   Erectile dysfunction 2019   due to zoloft --urol rx'd viagra   GERD (gastroesophageal reflux disease)    H/O tilt table evaluation 11/02/2005   negative   Helicobacter pylori gastritis 01/2016   History of adenomatous polyp of colon 10/12/2011   Dr. Rollin (3 right side of colon- tubular adenomas removed)   History of cardiovascular stress test 05/28/2012   no ischemia, EF 37%, imaging results are unchanged and within normal variance   History of chronic prostatitis    History of kidney stones    History of vertigo    + Hx of posterior HA's.  Neuro (Dr. Maurice) eval 2011.  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   Hyperlipidemia    Hypertension    Lumbar spondylosis  lumbosacral radiculopathy at L4 by EMG testing, right foot drop (neurologist is Dr. Birder with [facility] in [place])--neurologist referred him to neurosurgery   Migraine    used to have them all the time; none for years (01/04/2017)   Myocardial infarction [facility]) ?1970s   not entirely certain of this   Nephrolithiasis 07/2012   Left UVJ 2 mm stone with dilation of renal collecting system and slight hydroureter on right   Neuropathy    NICM (nonischemic cardiomyopathy) (HCC)    a. 02/2018 Cath: LM nl, LAD min irregs, LCX no, RCA 20d. [id number]. Fick CO/CI 4.4/2.0.   Osteoarthritis, multiple sites    Shoulders, back, knees   Pacemaker 02/05/2012   dual chamber, complete heart block, meddtronic revo, lasted checked 12/2015.  Since no CAD on cath 05/2016, cards recommends upgrade to CRT-D.   Peripheral vascular disease (HCC)    Peripheral Artery Disease   Permanent atrial fibrillation (HCC)    DCCV 07/09/13-converted, lasted two days, then back into afib--needs lifetime anticoagulation (Xarelto  as of 09/2014)   Prostate cancer screening 09/2017   done by urol annually (normal prostate exam  documented + PSA 0.84 as of 10/01/17 urol f/u.  10/2018 urol f/u PSA 0.6, no prostate nodule.   Rectus diastasis    Right ankle sprain 08/2017   w/distal fibula avulsion fx noted on u/s but not plain film-(Dr. Hudnall).   Skin cancer of arm, left    burned it off (01/04/2017)   TIA (transient ischemic attack)    L face and L arm weakness. Peri procedural->a. 03/22/2018 following cath. CT head neg. No MRI b/c has pacer. Likely due to embolus to distal branch of RMCA   Type II diabetes mellitus (HCC)     Past Surgical History:  Procedure Laterality Date   ABI's Bilateral 05/21/2018   normal   BACK SURGERY     BIV ICD INSERTION CRT-D N/A 01/04/2017   Procedure: BiV ICD ;  Surgeon: Inocencio Soyla Lunger, MD;  Location: [facility] INVASIVE CV LAB;  Service: Cardiovascular;  Laterality: N/A;   CARDIAC CATHETERIZATION N/A 06/14/2016   Minimal nonobstructive dz, EF 25-35%.  Procedure: Left Heart Cath and Coronary Angiography;  Surgeon: Peter M Swaz[place]and, MD;  Location: [facility] INVASIVE CV LAB;  Service: Cardiovascular;  Laterality: N/A;   CARDIOVASCULAR STRESS TEST  2012   2012 nuclear perfusion study: low risk scan; 04/2016 normal myocardial perfusion imaging, EF 32%.   CARDIOVERSION  07/09/2012   Procedure: CARDIOVERSION;  Surgeon: Jerel Balding, MD;  Location: [facility] ENDOSCOPY;  Service: Cardiovascular;  Laterality: N/A;   CATARACT EXTRACTION W/ INTRAOCULAR LENS IMPLANT & ANTERIOR VITRECTOMY, BILATERAL Bilateral    CIRCUMCISION N/A 03/23/2020   Procedure: CIRCUMCISION ADULT;  Surgeon: Rosalind Zachary NOVAK, MD;  Location: [facility] ORS;  Service: Urology;  Laterality: N/A;   COLONOSCOPY W/ POLYPECTOMY  approx 2006; repeated 09/2011   Polyps on 2013 EGD as well, repeat 12/2014   COLONOSCOPY WITH PROPOFOL  N/A 07/08/2021   adenoma x 1. Procedure: COLONOSCOPY WITH PROPOFOL ;  Surgeon: Rollin Dover, MD;  Location: [facility] ENDOSCOPY;  Service: Endoscopy;  Laterality: N/A;   ESOPHAGEAL DILATION  10/19/2023   Procedure: DILATION,  ESOPHAGUS;  Surgeon: Rollin Dover, MD;  Location: [facility] ENDOSCOPY;  Service: Gastroenterology;;   ESOPHAGOGASTRODUODENOSCOPY  10/18/2006   Done due to chronic GERD: Normal, bx showed no barrett's esophagus (Dr. Rollin)   EYE SURGERY  01/08/2023   FLEXOR TENDON REPAIR Left 10/02/2016   Procedure: LEFT RING FINGER WOUND EXPORATION AND FLEXOR TENDON REPAIR  AND NERVE REPAIR;  Surgeon: Alm Hummer, MD;  Location: [facility] OR;  Service: Orthopedics;  Laterality: Left;   INSERT / REPLACE / REMOVE PACEMAKER  02/05/2012   dual chamber, sinus node dysfunction, sinus arrest, PAF, Medtronic Revo serial#[id number] H: last checked 05/2015   LUMBAR LAMINECTOMY Left 1976   L4-5   PACEMAKER REMOVAL  01/04/2017   PERMANENT PACEMAKER INSERTION N/A 02/05/2012   Procedure: PERMANENT PACEMAKER INSERTION;  Surgeon: Jerel Balding, MD; Generator Medtronic [place] model [place] serial number [id number] H Laterality: N/A;   POLYPECTOMY  07/08/2021   Procedure: POLYPECTOMY;  Surgeon: Rollin Dover, MD;  Location: [facility] ENDOSCOPY;  Service: Endoscopy;;   RETINAL DETACHMENT SURGERY Left ~ 1999   REVERSE SHOULDER ARTHROPLASTY Left 2018   Left shoulder reverse [facility] in [place]).   RIGHT/LEFT HEART CATH AND CORONARY ANGIOGRAPHY N/A 03/22/2018   EF 30-35%, no CAD.  Procedure: RIGHT/LEFT HEART CATH AND CORONARY ANGIOGRAPHY;  Surgeon: Cherrie Toribio SAUNDERS, MD;  Location: [facility] INVASIVE CV LAB;  Service: Cardiovascular;  Laterality: N/A;   SAVORY DILATION N/A 10/19/2023   Procedure: EGD, WITH DILATION USING SAVARY-GILLIARD DILATOR OVER GUIDEWIRE;  Surgeon: Rollin Dover, MD;  Location: [facility] END[facility];  Service: Gastroenterology;  Laterality: N/A;   TRANSTHORACIC ECHOCARDIOGRAM  08/25/10; 05/2012; 03/23/16;12/2017   mild asymmetric LVH, normal systolic function, normal diastolic fxn, mild-to-mod mitral regurg, mild aortic valve sclerosis and trace AI, mild aortic root dilatation. 2014 f/u showed EF 40-45%, mod LAE, A FIB.  02/2016 EF 40%,  diffuse hypokinesis, grade 2 DD. 12/2017 EF 35-40%,diffuse hypokin,grd III DD, mild MR   WISDOM TOOTH EXTRACTION  06/27/2021   EF 50-55%, mild aortic root/ascending aorta dilatation (41-42 mm).    MEDICATIONS:  BD INSULIN  SYRINGE U/F 31G X 5/16 1 ML MISC   COMBIGAN  0.2-0.5 % ophthalmic solution   Continuous Blood Gluc Receiver (FREESTYLE LIBRE 2 READER) DEVI   Continuous Blood Gluc Sensor (FREESTYLE LIBRE 2 SENSOR) MISC   diclofenac  Sodium (VOLTAREN ) 1 % GEL   ENTRESTO  97-103 MG   eplerenone  (INSPRA ) 25 MG tablet   gabapentin  (NEURONTIN ) 300 MG capsule   insulin  aspart (NOVOLOG  FLEXPEN) 100 UNIT/ML FlexPen   insulin  glargine (LANTUS  SOLOSTAR) 100 UNIT/ML Solostar Pen   JARDIANCE  10 MG TABS tablet   latanoprost  (XALATAN ) 0.005 % ophthalmic solution   lidocaine  (LIDODERM ) 5 %   meclizine  (ANTIVERT ) 25 MG tablet   metoprolol  succinate (TOPROL -XL) 50 MG 24 hr tablet   omeprazole  (PRILOSEC) 40 MG capsule   oxybutynin  (DITROPAN -XL) 10 MG 24 hr tablet   oxyCODONE -acetaminophen  (PERCOCET) 10-325 MG tablet   rOPINIRole  (REQUIP ) 1 MG tablet   rosuvastatin  (CRESTOR ) 20 MG tablet   Semaglutide , 1 MG/DOSE, 4 MG/3ML SOPN   sertraline  (ZOLOFT ) 100 MG tablet   SURE COMFORT PEN NEEDLES 31G X 5 MM MISC   XARELTO  20 MG TABS tablet   No current facility-administered medications for this encounter.    Isaiah Ruder, PA-C Surgical Short Stay/Anesthesiology [facility] Phone [phone] [facility] Phone [phone] 02/18/2024 8:11 PM

## 2024-02-20 ENCOUNTER — Other Ambulatory Visit: Payer: Self-pay

## 2024-02-20 MED ORDER — ROPINIROLE HCL 1 MG PO TABS: 1.0000 mg | ORAL_TABLET | Freq: Every day | ORAL | 3 refills | Status: AC

## 2024-02-20 NOTE — Telephone Encounter (Signed)
Please refill, if appropriate.

## 2024-02-25 ENCOUNTER — Encounter (HOSPITAL_COMMUNITY): Payer: Self-pay | Admitting: Neurosurgery

## 2024-02-25 ENCOUNTER — Inpatient Hospital Stay (HOSPITAL_COMMUNITY)
Admission: RE | Admit: 2024-02-25 | Discharge: 2024-02-27 | DRG: 448 | Disposition: A | Discharge status: Home / Self Care | Attending: Neurosurgery | Admitting: Neurosurgery

## 2024-02-25 ENCOUNTER — Inpatient Hospital Stay (HOSPITAL_COMMUNITY)

## 2024-02-25 ENCOUNTER — Other Ambulatory Visit: Payer: Self-pay

## 2024-02-25 ENCOUNTER — Encounter (HOSPITAL_COMMUNITY)
Admission: RE | Disposition: A | Discharge status: Home / Self Care | Payer: Self-pay | Source: Home / Self Care | Attending: Neurosurgery

## 2024-02-25 ENCOUNTER — Inpatient Hospital Stay (HOSPITAL_COMMUNITY): Payer: Self-pay | Admitting: Vascular Surgery

## 2024-02-25 ENCOUNTER — Inpatient Hospital Stay (HOSPITAL_COMMUNITY): Payer: Self-pay | Admitting: Anesthesiology

## 2024-02-25 DIAGNOSIS — Z8249 Family history of ischemic heart disease and other diseases of the circulatory system: Secondary | ICD-10-CM

## 2024-02-25 DIAGNOSIS — Z7984 Long term (current) use of oral hypoglycemic drugs: Secondary | ICD-10-CM | POA: Diagnosis not present

## 2024-02-25 DIAGNOSIS — F32A Depression, unspecified: Secondary | ICD-10-CM | POA: Diagnosis present

## 2024-02-25 DIAGNOSIS — Z794 Long term (current) use of insulin: Secondary | ICD-10-CM

## 2024-02-25 DIAGNOSIS — Z7901 Long term (current) use of anticoagulants: Secondary | ICD-10-CM | POA: Diagnosis not present

## 2024-02-25 DIAGNOSIS — N400 Enlarged prostate without lower urinary tract symptoms: Secondary | ICD-10-CM | POA: Diagnosis present

## 2024-02-25 DIAGNOSIS — I110 Hypertensive heart disease with heart failure: Secondary | ICD-10-CM | POA: Diagnosis present

## 2024-02-25 DIAGNOSIS — Z96612 Presence of left artificial shoulder joint: Secondary | ICD-10-CM | POA: Diagnosis present

## 2024-02-25 DIAGNOSIS — Z823 Family history of stroke: Secondary | ICD-10-CM | POA: Diagnosis not present

## 2024-02-25 DIAGNOSIS — Z8673 Personal history of transient ischemic attack (TIA), and cerebral infarction without residual deficits: Secondary | ICD-10-CM

## 2024-02-25 DIAGNOSIS — I5042 Chronic combined systolic (congestive) and diastolic (congestive) heart failure: Secondary | ICD-10-CM | POA: Diagnosis present

## 2024-02-25 DIAGNOSIS — M48061 Spinal stenosis, lumbar region without neurogenic claudication: Secondary | ICD-10-CM

## 2024-02-25 DIAGNOSIS — Z79899 Other long term (current) drug therapy: Secondary | ICD-10-CM

## 2024-02-25 DIAGNOSIS — I4821 Permanent atrial fibrillation: Secondary | ICD-10-CM | POA: Diagnosis present

## 2024-02-25 DIAGNOSIS — I5022 Chronic systolic (congestive) heart failure: Secondary | ICD-10-CM | POA: Diagnosis not present

## 2024-02-25 DIAGNOSIS — E785 Hyperlipidemia, unspecified: Secondary | ICD-10-CM | POA: Diagnosis present

## 2024-02-25 DIAGNOSIS — Z95810 Presence of automatic (implantable) cardiac defibrillator: Secondary | ICD-10-CM | POA: Diagnosis not present

## 2024-02-25 DIAGNOSIS — M4316 Spondylolisthesis, lumbar region: Secondary | ICD-10-CM | POA: Diagnosis present

## 2024-02-25 DIAGNOSIS — Z8616 Personal history of COVID-19: Secondary | ICD-10-CM | POA: Diagnosis not present

## 2024-02-25 DIAGNOSIS — I252 Old myocardial infarction: Secondary | ICD-10-CM | POA: Diagnosis not present

## 2024-02-25 DIAGNOSIS — M5416 Radiculopathy, lumbar region: Secondary | ICD-10-CM | POA: Diagnosis present

## 2024-02-25 DIAGNOSIS — Z981 Arthrodesis status: Secondary | ICD-10-CM | POA: Diagnosis not present

## 2024-02-25 DIAGNOSIS — Z85828 Personal history of other malignant neoplasm of skin: Secondary | ICD-10-CM

## 2024-02-25 DIAGNOSIS — E1151 Type 2 diabetes mellitus with diabetic peripheral angiopathy without gangrene: Secondary | ICD-10-CM | POA: Diagnosis present

## 2024-02-25 DIAGNOSIS — K219 Gastro-esophageal reflux disease without esophagitis: Secondary | ICD-10-CM | POA: Diagnosis present

## 2024-02-25 DIAGNOSIS — I428 Other cardiomyopathies: Secondary | ICD-10-CM | POA: Diagnosis present

## 2024-02-25 DIAGNOSIS — Z808 Family history of malignant neoplasm of other organs or systems: Secondary | ICD-10-CM

## 2024-02-25 DIAGNOSIS — M2578 Osteophyte, vertebrae: Secondary | ICD-10-CM | POA: Diagnosis not present

## 2024-02-25 DIAGNOSIS — E119 Type 2 diabetes mellitus without complications: Secondary | ICD-10-CM

## 2024-02-25 DIAGNOSIS — Z801 Family history of malignant neoplasm of trachea, bronchus and lung: Secondary | ICD-10-CM

## 2024-02-25 DIAGNOSIS — E1122 Type 2 diabetes mellitus with diabetic chronic kidney disease: Secondary | ICD-10-CM | POA: Diagnosis present

## 2024-02-25 DIAGNOSIS — E1142 Type 2 diabetes mellitus with diabetic polyneuropathy: Secondary | ICD-10-CM | POA: Diagnosis present

## 2024-02-25 DIAGNOSIS — M4310 Spondylolisthesis, site unspecified: Principal | ICD-10-CM | POA: Diagnosis present

## 2024-02-25 HISTORY — PX: ANTERIOR LATERAL LUMBAR FUSION WITH PERCUTANEOUS SCREW 2 LEVEL: SHX5554

## 2024-02-25 LAB — GLUCOSE, CAPILLARY
Glucose-Capillary: 156 mg/dL — ABNORMAL HIGH (ref 70–99)
Glucose-Capillary: 152 mg/dL — ABNORMAL HIGH (ref 70–99)
Glucose-Capillary: 184 mg/dL — ABNORMAL HIGH (ref 70–99)
Glucose-Capillary: 183 mg/dL — ABNORMAL HIGH (ref 70–99)

## 2024-02-25 LAB — ABO/RH: ABO/RH(D): B POS

## 2024-02-25 SURGERY — ANTERIOR LATERAL LUMBAR FUSION WITH PERCUTANEOUS SCREW 2 LEVEL
Anesthesia: General | Laterality: Left

## 2024-02-25 MED ORDER — HYDROMORPHONE HCL 1 MG/ML IJ SOLN
1.0000 mg | INTRAMUSCULAR | Status: DC | PRN
  Administered 2024-02-25: 1 mg via INTRAVENOUS
  Filled 2024-02-25: qty 1

## 2024-02-25 MED ORDER — POLYETHYLENE GLYCOL 3350 17 G PO PACK: 17.0000 g | PACK | Freq: Every day | ORAL | Status: DC | PRN

## 2024-02-25 MED ORDER — MUPIROCIN 2 % EX OINT: 1.0000 | TOPICAL_OINTMENT | Freq: Two times a day (BID) | CUTANEOUS | 0 refills | Status: AC

## 2024-02-25 MED ORDER — CEFAZOLIN SODIUM-DEXTROSE 2-4 GM/100ML-% IV SOLN
2.0000 g | INTRAVENOUS | Status: AC
  Administered 2024-02-25: 2 g via INTRAVENOUS

## 2024-02-25 MED ORDER — BRIMONIDINE TARTRATE 0.2 % OP SOLN
1.0000 [drp] | Freq: Two times a day (BID) | OPHTHALMIC | Status: DC
  Administered 2024-02-25 – 2024-02-27 (×4): 1 [drp] via OPHTHALMIC
  Filled 2024-02-25: qty 5

## 2024-02-25 MED ORDER — EMPAGLIFLOZIN 10 MG PO TABS
10.0000 mg | ORAL_TABLET | Freq: Every day | ORAL | Status: DC
  Administered 2024-02-25 – 2024-02-27 (×3): 10 mg via ORAL
  Filled 2024-02-25 (×3): qty 1

## 2024-02-25 MED ORDER — EPLERENONE 25 MG PO TABS
25.0000 mg | ORAL_TABLET | Freq: Every day | ORAL | Status: DC
  Administered 2024-02-25 – 2024-02-27 (×3): 25 mg via ORAL
  Filled 2024-02-25 (×3): qty 1

## 2024-02-25 MED ORDER — FENTANYL CITRATE (PF) 250 MCG/5ML IJ SOLN
INTRAMUSCULAR | Status: AC
  Filled 2024-02-25: qty 5

## 2024-02-25 MED ORDER — DICLOFENAC SODIUM 1 % EX GEL: 4.0000 g | Freq: Four times a day (QID) | CUTANEOUS | Status: DC | PRN

## 2024-02-25 MED ORDER — DROPERIDOL 2.5 MG/ML IJ SOLN
0.6250 mg | Freq: Once | INTRAMUSCULAR | Status: AC
  Administered 2024-02-25: 0.625 mg via INTRAVENOUS

## 2024-02-25 MED ORDER — FLEET ENEMA RE ENEM
1.0000 | ENEMA | Freq: Once | RECTAL | Status: DC | PRN
Start: 2024-02-25 — End: 2024-02-27

## 2024-02-25 MED ORDER — THROMBIN 20000 UNITS EX SOLR
CUTANEOUS | Status: DC | PRN
  Administered 2024-02-25: 20 mL via TOPICAL

## 2024-02-25 MED ORDER — BRIMONIDINE TARTRATE-TIMOLOL 0.2-0.5 % OP SOLN: 1.0000 [drp] | Freq: Two times a day (BID) | OPHTHALMIC | Status: DC

## 2024-02-25 MED ORDER — OXYCODONE HCL 5 MG PO TABS: 5.0000 mg | ORAL_TABLET | ORAL | Status: DC | PRN

## 2024-02-25 MED ORDER — TIMOLOL MALEATE 0.5 % OP SOLN
1.0000 [drp] | Freq: Two times a day (BID) | OPHTHALMIC | Status: DC
  Administered 2024-02-25 – 2024-02-27 (×4): 1 [drp] via OPHTHALMIC
  Filled 2024-02-25: qty 5

## 2024-02-25 MED ORDER — ONDANSETRON HCL 4 MG/2ML IJ SOLN
INTRAMUSCULAR | Status: AC
Start: 2024-02-25 — End: 2024-02-25
  Filled 2024-02-25: qty 2

## 2024-02-25 MED ORDER — ACETAMINOPHEN 650 MG RE SUPP: 650.0000 mg | RECTAL | Status: DC | PRN

## 2024-02-25 MED ORDER — INSULIN ASPART 100 UNIT/ML IJ SOLN: 0.0000 [IU] | INTRAMUSCULAR | Status: DC | PRN

## 2024-02-25 MED ORDER — INSULIN ASPART 100 UNIT/ML FLEXPEN
0.0000 [IU] | PEN_INJECTOR | Freq: Three times a day (TID) | SUBCUTANEOUS | Status: DC
  Filled 2024-02-25: qty 3

## 2024-02-25 MED ORDER — PROPOFOL 10 MG/ML IV BOLUS
INTRAVENOUS | Status: AC
  Filled 2024-02-25: qty 20

## 2024-02-25 MED ORDER — ORAL CARE MOUTH RINSE: 15.0000 mL | Freq: Once | OROMUCOSAL | Status: AC

## 2024-02-25 MED ORDER — ONDANSETRON HCL 4 MG/2ML IJ SOLN
4.0000 mg | Freq: Once | INTRAMUSCULAR | Status: AC | PRN
  Administered 2024-02-25: 4 mg via INTRAVENOUS

## 2024-02-25 MED ORDER — CEFAZOLIN SODIUM-DEXTROSE 2-4 GM/100ML-% IV SOLN
INTRAVENOUS | Status: AC
  Filled 2024-02-25: qty 100

## 2024-02-25 MED ORDER — ONDANSETRON HCL 4 MG/2ML IJ SOLN
INTRAMUSCULAR | Status: DC | PRN
  Administered 2024-02-25: 4 mg via INTRAVENOUS

## 2024-02-25 MED ORDER — CHLORHEXIDINE GLUCONATE 0.12 % MT SOLN: 15.0000 mL | Freq: Once | OROMUCOSAL | Status: AC

## 2024-02-25 MED ORDER — ACETAMINOPHEN 10 MG/ML IV SOLN
INTRAVENOUS | Status: AC
  Filled 2024-02-25: qty 100

## 2024-02-25 MED ORDER — PROPOFOL 500 MG/50ML IV EMUL
INTRAVENOUS | Status: DC | PRN
  Administered 2024-02-25 (×2): 125 ug/kg/min via INTRAVENOUS

## 2024-02-25 MED ORDER — BUPIVACAINE HCL (PF) 0.25 % IJ SOLN
INTRAMUSCULAR | Status: AC
Start: 2024-02-25 — End: 2024-02-25
  Filled 2024-02-25: qty 30

## 2024-02-25 MED ORDER — ONDANSETRON HCL 4 MG/2ML IJ SOLN: 4.0000 mg | Freq: Four times a day (QID) | INTRAMUSCULAR | Status: DC | PRN

## 2024-02-25 MED ORDER — CHLORHEXIDINE GLUCONATE CLOTH 2 % EX PADS: 6.0000 | MEDICATED_PAD | Freq: Once | CUTANEOUS | Status: DC

## 2024-02-25 MED ORDER — BUPIVACAINE HCL (PF) 0.25 % IJ SOLN
INTRAMUSCULAR | Status: DC | PRN
  Administered 2024-02-25: 30 mL

## 2024-02-25 MED ORDER — ROCURONIUM BROMIDE 10 MG/ML (PF) SYRINGE
PREFILLED_SYRINGE | INTRAVENOUS | Status: AC
Start: 2024-02-25 — End: 2024-02-25
  Filled 2024-02-25: qty 10

## 2024-02-25 MED ORDER — ONDANSETRON HCL 4 MG/2ML IJ SOLN
INTRAMUSCULAR | Status: AC
  Filled 2024-02-25: qty 2

## 2024-02-25 MED ORDER — PHENYLEPHRINE 80 MCG/ML (10ML) SYRINGE FOR IV PUSH (FOR BLOOD PRESSURE SUPPORT)
PREFILLED_SYRINGE | INTRAVENOUS | Status: DC | PRN
  Administered 2024-02-25: 80 ug via INTRAVENOUS

## 2024-02-25 MED ORDER — OXYCODONE HCL 5 MG PO TABS: 5.0000 mg | ORAL_TABLET | Freq: Once | ORAL | Status: DC | PRN

## 2024-02-25 MED ORDER — METOPROLOL SUCCINATE ER 50 MG PO TB24
50.0000 mg | ORAL_TABLET | Freq: Every day | ORAL | Status: DC
  Administered 2024-02-26 – 2024-02-27 (×2): 50 mg via ORAL
  Filled 2024-02-25 (×2): qty 1

## 2024-02-25 MED ORDER — GABAPENTIN 300 MG PO CAPS
600.0000 mg | ORAL_CAPSULE | Freq: Two times a day (BID) | ORAL | Status: DC
  Administered 2024-02-25 – 2024-02-27 (×4): 600 mg via ORAL
  Filled 2024-02-25 (×4): qty 2

## 2024-02-25 MED ORDER — OXYCODONE HCL 5 MG/5ML PO SOLN: 5.0000 mg | Freq: Once | ORAL | Status: DC | PRN

## 2024-02-25 MED ORDER — DEXAMETHASONE SODIUM PHOSPHATE 10 MG/ML IJ SOLN
INTRAMUSCULAR | Status: AC
Start: 2024-02-25 — End: 2024-02-25
  Filled 2024-02-25: qty 1

## 2024-02-25 MED ORDER — PHENYLEPHRINE HCL-NACL 20-0.9 MG/250ML-% IV SOLN
INTRAVENOUS | Status: DC | PRN
  Administered 2024-02-25: 40 ug/min via INTRAVENOUS

## 2024-02-25 MED ORDER — DROPERIDOL 2.5 MG/ML IJ SOLN
INTRAMUSCULAR | Status: AC
  Filled 2024-02-25: qty 2

## 2024-02-25 MED ORDER — CEFAZOLIN SODIUM-DEXTROSE 1-4 GM/50ML-% IV SOLN
1.0000 g | Freq: Three times a day (TID) | INTRAVENOUS | Status: AC
  Administered 2024-02-25 (×2): 1 g via INTRAVENOUS
  Filled 2024-02-25 (×2): qty 50

## 2024-02-25 MED ORDER — METHOCARBAMOL 500 MG PO TABS
500.0000 mg | ORAL_TABLET | Freq: Four times a day (QID) | ORAL | Status: DC | PRN
Start: 2024-02-25 — End: 2024-02-27
  Administered 2024-02-25 – 2024-02-26 (×4): 500 mg via ORAL
  Filled 2024-02-25 (×4): qty 1

## 2024-02-25 MED ORDER — INSULIN ASPART 100 UNIT/ML IJ SOLN
0.0000 [IU] | Freq: Three times a day (TID) | INTRAMUSCULAR | Status: DC
  Administered 2024-02-26: 1 [IU] via SUBCUTANEOUS
  Administered 2024-02-27: 2 [IU] via SUBCUTANEOUS

## 2024-02-25 MED ORDER — THROMBIN 20000 UNITS EX SOLR
CUTANEOUS | Status: AC
  Filled 2024-02-25: qty 20000

## 2024-02-25 MED ORDER — FENTANYL CITRATE (PF) 100 MCG/2ML IJ SOLN
25.0000 ug | INTRAMUSCULAR | Status: DC | PRN
  Administered 2024-02-25: 25 ug via INTRAVENOUS

## 2024-02-25 MED ORDER — MECLIZINE HCL 25 MG PO TABS
25.0000 mg | ORAL_TABLET | Freq: Three times a day (TID) | ORAL | Status: DC
  Administered 2024-02-25 – 2024-02-27 (×6): 25 mg via ORAL
  Filled 2024-02-25 (×6): qty 1

## 2024-02-25 MED ORDER — INSULIN ASPART 100 UNIT/ML IJ SOLN
0.0000 [IU] | Freq: Three times a day (TID) | INTRAMUSCULAR | Status: DC
  Administered 2024-02-25: 2 [IU] via SUBCUTANEOUS

## 2024-02-25 MED ORDER — LACTATED RINGERS IV SOLN: INTRAVENOUS | Status: DC

## 2024-02-25 MED ORDER — ONDANSETRON HCL 4 MG PO TABS: 4.0000 mg | ORAL_TABLET | Freq: Four times a day (QID) | ORAL | Status: DC | PRN

## 2024-02-25 MED ORDER — SODIUM CHLORIDE 0.9% FLUSH
3.0000 mL | INTRAVENOUS | Status: DC | PRN
Start: 2024-02-25 — End: 2024-02-27

## 2024-02-25 MED ORDER — ROPINIROLE HCL 1 MG PO TABS
1.0000 mg | ORAL_TABLET | Freq: Every day | ORAL | Status: DC
  Administered 2024-02-25 – 2024-02-26 (×2): 1 mg via ORAL
  Filled 2024-02-25 (×2): qty 1

## 2024-02-25 MED ORDER — ACETAMINOPHEN 10 MG/ML IV SOLN
INTRAVENOUS | Status: DC | PRN
  Administered 2024-02-25: 1000 mg via INTRAVENOUS

## 2024-02-25 MED ORDER — DOCUSATE SODIUM 100 MG PO CAPS
100.0000 mg | ORAL_CAPSULE | Freq: Two times a day (BID) | ORAL | Status: DC | PRN
  Administered 2024-02-25: 100 mg via ORAL
  Filled 2024-02-25: qty 1

## 2024-02-25 MED ORDER — 0.9 % SODIUM CHLORIDE (POUR BTL) OPTIME
TOPICAL | Status: DC | PRN
  Administered 2024-02-25 (×2): 1000 mL

## 2024-02-25 MED ORDER — FENTANYL CITRATE (PF) 100 MCG/2ML IJ SOLN
INTRAMUSCULAR | Status: AC
Start: 2024-02-25 — End: 2024-02-25
  Filled 2024-02-25: qty 2

## 2024-02-25 MED ORDER — MUPIROCIN 2 % EX OINT
1.0000 | TOPICAL_OINTMENT | Freq: Two times a day (BID) | CUTANEOUS | Status: DC
  Administered 2024-02-25 – 2024-02-27 (×4): 1 via NASAL
  Filled 2024-02-25: qty 22

## 2024-02-25 MED ORDER — DEXAMETHASONE SODIUM PHOSPHATE 10 MG/ML IJ SOLN
INTRAMUSCULAR | Status: DC | PRN
  Administered 2024-02-25: 5 mg via INTRAVENOUS

## 2024-02-25 MED ORDER — PHENYLEPHRINE HCL-NACL 20-0.9 MG/250ML-% IV SOLN
INTRAVENOUS | Status: AC
  Filled 2024-02-25: qty 500

## 2024-02-25 MED ORDER — SODIUM CHLORIDE 0.9 % IV SOLN: 250.0000 mL | INTRAVENOUS | Status: AC

## 2024-02-25 MED ORDER — INSULIN GLARGINE-YFGN 100 UNIT/ML ~~LOC~~ SOLN
28.0000 [IU] | Freq: Every day | SUBCUTANEOUS | Status: DC
  Administered 2024-02-25 – 2024-02-26 (×2): 28 [IU] via SUBCUTANEOUS
  Filled 2024-02-25 (×3): qty 0.28

## 2024-02-25 MED ORDER — SACUBITRIL-VALSARTAN 97-103 MG PO TABS
1.0000 | ORAL_TABLET | Freq: Two times a day (BID) | ORAL | Status: DC
  Administered 2024-02-25 – 2024-02-27 (×4): 1 via ORAL
  Filled 2024-02-25 (×4): qty 1

## 2024-02-25 MED ORDER — ACETAMINOPHEN 325 MG PO TABS: 650.0000 mg | ORAL_TABLET | ORAL | Status: DC | PRN

## 2024-02-25 MED ORDER — MENTHOL 3 MG MT LOZG: 1.0000 | LOZENGE | OROMUCOSAL | Status: DC | PRN

## 2024-02-25 MED ORDER — FENTANYL CITRATE (PF) 250 MCG/5ML IJ SOLN
INTRAMUSCULAR | Status: DC | PRN
  Administered 2024-02-25: 100 ug via INTRAVENOUS
  Administered 2024-02-25: 50 ug via INTRAVENOUS

## 2024-02-25 MED ORDER — LATANOPROST 0.005 % OP SOLN
1.0000 [drp] | Freq: Every day | OPHTHALMIC | Status: DC
  Administered 2024-02-25 – 2024-02-26 (×2): 1 [drp] via OPHTHALMIC
  Filled 2024-02-25: qty 2.5

## 2024-02-25 MED ORDER — ACETAMINOPHEN 10 MG/ML IV SOLN
1000.0000 mg | Freq: Once | INTRAVENOUS | Status: DC | PRN
Start: 2024-02-25 — End: 2024-02-25

## 2024-02-25 MED ORDER — PROPOFOL 10 MG/ML IV BOLUS
INTRAVENOUS | Status: DC | PRN
  Administered 2024-02-25: 100 mg via INTRAVENOUS

## 2024-02-25 MED ORDER — LIDOCAINE 2% (20 MG/ML) 5 ML SYRINGE
INTRAMUSCULAR | Status: AC
Start: 2024-02-25 — End: 2024-02-25
  Filled 2024-02-25: qty 5

## 2024-02-25 MED ORDER — CHLORHEXIDINE GLUCONATE 4 % EX SOLN: 1.0000 | CUTANEOUS | 1 refills | Status: DC

## 2024-02-25 MED ORDER — BISACODYL 10 MG RE SUPP: 10.0000 mg | Freq: Every day | RECTAL | Status: DC | PRN

## 2024-02-25 MED ORDER — PHENOL 1.4 % MT LIQD: 1.0000 | OROMUCOSAL | Status: DC | PRN

## 2024-02-25 MED ORDER — ROSUVASTATIN CALCIUM 20 MG PO TABS
20.0000 mg | ORAL_TABLET | Freq: Every day | ORAL | Status: DC
  Administered 2024-02-25 – 2024-02-26 (×2): 20 mg via ORAL
  Filled 2024-02-25 (×2): qty 1

## 2024-02-25 MED ORDER — OXYBUTYNIN CHLORIDE ER 10 MG PO TB24
10.0000 mg | ORAL_TABLET | Freq: Every day | ORAL | Status: DC
  Administered 2024-02-25 – 2024-02-26 (×2): 10 mg via ORAL
  Filled 2024-02-25 (×2): qty 1

## 2024-02-25 MED ORDER — SERTRALINE HCL 100 MG PO TABS
100.0000 mg | ORAL_TABLET | Freq: Every day | ORAL | Status: DC
  Administered 2024-02-26 – 2024-02-27 (×2): 100 mg via ORAL
  Filled 2024-02-25 (×2): qty 1

## 2024-02-25 MED ORDER — PANTOPRAZOLE SODIUM 40 MG PO TBEC
40.0000 mg | DELAYED_RELEASE_TABLET | Freq: Every day | ORAL | Status: DC
  Administered 2024-02-25 – 2024-02-27 (×3): 40 mg via ORAL
  Filled 2024-02-25 (×3): qty 1

## 2024-02-25 MED ORDER — SODIUM CHLORIDE 0.9 % IV SOLN
0.0125 ug/kg/min | INTRAVENOUS | Status: AC
  Administered 2024-02-25: .1 ug/kg/min via INTRAVENOUS
  Filled 2024-02-25: qty 2000

## 2024-02-25 MED ORDER — LIDOCAINE 2% (20 MG/ML) 5 ML SYRINGE
INTRAMUSCULAR | Status: DC | PRN
  Administered 2024-02-25: 100 mg via INTRAVENOUS

## 2024-02-25 MED ORDER — SODIUM CHLORIDE 0.9% FLUSH
3.0000 mL | Freq: Two times a day (BID) | INTRAVENOUS | Status: DC
  Administered 2024-02-25 – 2024-02-26 (×2): 3 mL via INTRAVENOUS

## 2024-02-25 MED ORDER — LACTATED RINGERS IV SOLN
INTRAVENOUS | Status: DC | PRN
Start: 2024-02-25 — End: 2024-02-25

## 2024-02-25 MED ORDER — SUCCINYLCHOLINE CHLORIDE 200 MG/10ML IV SOSY
PREFILLED_SYRINGE | INTRAVENOUS | Status: DC | PRN
  Administered 2024-02-25: 120 mg via INTRAVENOUS

## 2024-02-25 MED ORDER — CHLORHEXIDINE GLUCONATE 0.12 % MT SOLN
OROMUCOSAL | Status: AC
  Administered 2024-02-25: 15 mL via OROMUCOSAL
  Filled 2024-02-25: qty 15

## 2024-02-25 MED ORDER — INSULIN ASPART 100 UNIT/ML FLEXPEN: 0.0000 [IU] | PEN_INJECTOR | Freq: Three times a day (TID) | SUBCUTANEOUS | Status: DC

## 2024-02-25 MED ORDER — OXYCODONE HCL 5 MG PO TABS
10.0000 mg | ORAL_TABLET | ORAL | Status: DC | PRN
  Administered 2024-02-25 – 2024-02-27 (×11): 10 mg via ORAL
  Filled 2024-02-25 (×11): qty 2

## 2024-02-25 MED ORDER — METHOCARBAMOL 1000 MG/10ML IJ SOLN: 500.0000 mg | Freq: Four times a day (QID) | INTRAMUSCULAR | Status: DC | PRN

## 2024-02-25 MED ORDER — METOPROLOL SUCCINATE ER 25 MG PO TB24
50.0000 mg | ORAL_TABLET | Freq: Once | ORAL | Status: AC
  Administered 2024-02-25: 50 mg via ORAL
  Filled 2024-02-25: qty 2

## 2024-02-25 MED ORDER — SENNA 8.6 MG PO TABS
1.0000 | ORAL_TABLET | Freq: Two times a day (BID) | ORAL | Status: DC | PRN
  Administered 2024-02-25: 8.6 mg via ORAL
  Filled 2024-02-25: qty 1

## 2024-02-25 SURGICAL SUPPLY — 50 items
BAG COUNTER SPONGE SURGICOUNT (BAG) ×1 IMPLANT
BENZOIN TINCTURE PRP APPL 2/3 (GAUZE/BANDAGES/DRESSINGS) ×1 IMPLANT
BLADE CLIPPER SURG (BLADE) IMPLANT
BONE MATRIX OSTEOCEL PRO LRG (Bone Implant) IMPLANT
BUR MATCHSTICK NEURO 3.0 LAGG (BURR) IMPLANT
CAGE MODULUS XL 10X18X55 - 10 (Cage) IMPLANT
COVER BACK TABLE 60X90IN (DRAPES) ×1 IMPLANT
DERMABOND ADVANCED .7 DNX12 (GAUZE/BANDAGES/DRESSINGS) ×2 IMPLANT
DRAPE C-ARM 42X72 X-RAY (DRAPES) ×1 IMPLANT
DRAPE C-ARMOR (DRAPES) ×1 IMPLANT
DRAPE LAPAROTOMY 100X72X124 (DRAPES) ×1 IMPLANT
DRAPE SURG 17X23 STRL (DRAPES) ×2 IMPLANT
DRSG OPSITE POSTOP 4X6 (GAUZE/BANDAGES/DRESSINGS) IMPLANT
DRSG OPSITE POSTOP 4X8 (GAUZE/BANDAGES/DRESSINGS) IMPLANT
DURAPREP 26ML APPLICATOR (WOUND CARE) ×1 IMPLANT
ELECT NVM5 SURFACE MEP/EMG (ELECTRODE) IMPLANT
ELECTRODE REM PT RTRN 9FT ADLT (ELECTROSURGICAL) ×1 IMPLANT
GAUZE 4X4 16PLY ~~LOC~~+RFID DBL (SPONGE) IMPLANT
GLOVE BIO SURGEON STRL SZ 6 (GLOVE) ×1 IMPLANT
GLOVE BIOGEL PI IND STRL 6.5 (GLOVE) ×1 IMPLANT
GLOVE ECLIPSE 9.0 STRL (GLOVE) ×1 IMPLANT
GLOVE EXAM NITRILE XL STR (GLOVE) IMPLANT
GOWN STRL REUS W/ TWL LRG LVL3 (GOWN DISPOSABLE) IMPLANT
GOWN STRL REUS W/ TWL XL LVL3 (GOWN DISPOSABLE) ×2 IMPLANT
GOWN STRL REUS W/TWL 2XL LVL3 (GOWN DISPOSABLE) IMPLANT
GUIDEWIRE NITINOL BEVEL TIP (WIRE) IMPLANT
KIT BASIN OR (CUSTOM PROCEDURE TRAY) ×1 IMPLANT
KIT DILATOR XLIF 5 (KITS) IMPLANT
KIT SURGICAL ACCESS MAXCESS 4 (KITS) IMPLANT
KIT TURNOVER KIT B (KITS) ×1 IMPLANT
MODULE EMG NDL SSEP NVM5 (NEUROSURGERY SUPPLIES) IMPLANT
MODULE EMG NEEDLE SSEP NVM5 (NEUROSURGERY SUPPLIES) ×1 IMPLANT
NDL HYPO 22X1.5 SAFETY MO (MISCELLANEOUS) ×1 IMPLANT
NDL I-PASS III (NEEDLE) IMPLANT
NEEDLE HYPO 22X1.5 SAFETY MO (MISCELLANEOUS) ×1 IMPLANT
NEEDLE I-PASS III (NEEDLE) ×1 IMPLANT
NS IRRIG 1000ML POUR BTL (IV SOLUTION) ×1 IMPLANT
PACK LAMINECTOMY NEURO (CUSTOM PROCEDURE TRAY) ×1 IMPLANT
ROD SPINAL 5.5X80 TI LORDOSE (Rod) IMPLANT
SCREW LOCK RELINE 5.5 TULIP (Screw) IMPLANT
SCREW RELINE PA 6.5X45 (Screw) IMPLANT
SPONGE SURGIFOAM ABS GEL 100 (HEMOSTASIS) IMPLANT
SPONGE T-LAP 4X18 ~~LOC~~+RFID (SPONGE) IMPLANT
STRIP CLOSURE SKIN 1/2X4 (GAUZE/BANDAGES/DRESSINGS) ×1 IMPLANT
SUT VIC AB 2-0 CT1 18 (SUTURE) ×2 IMPLANT
SUT VIC AB 3-0 SH 8-18 (SUTURE) ×2 IMPLANT
TOWEL GREEN STERILE (TOWEL DISPOSABLE) ×1 IMPLANT
TOWEL GREEN STERILE FF (TOWEL DISPOSABLE) ×1 IMPLANT
TRAY FOLEY MTR SLVR 16FR STAT (SET/KITS/TRAYS/PACK) ×1 IMPLANT
WATER STERILE IRR 1000ML POUR (IV SOLUTION) ×1 IMPLANT

## 2024-02-25 NOTE — Transfer of Care (Signed)
 Immediate Anesthesia Transfer of Care Note  Patient: KOHL POLINSKY  Procedure(s) Performed: LEFT ANTERIOR LATERAL LUMBAR FUSION WITH PERCUTANEOUS PEDICLE SCREW LUMBAR TWO-LUMBAR THREE, LUMBAR THREE-LUMBAR FOUR, POSTERIOR LATERAL AND INTERBODY FUSION (Left)  Patient Location: PACU  Anesthesia Type:General  Level of Consciousness: drowsy  Airway & Oxygen Therapy: Patient Spontanous Breathing  Post-op Assessment: Report given to RN and Post -op Vital signs reviewed and stable  Post vital signs: Reviewed  Last Vitals:  Vitals Value Taken Time  BP 118/92 02/25/24 11:17  Temp 36.6 C 02/25/24 11:18  Pulse 71 02/25/24 11:23  Resp 16 02/25/24 11:23  SpO2 98 % 02/25/24 11:23  Vitals shown include unfiled device data.  Last Pain:  Vitals:   02/25/24 1118  TempSrc:   PainSc: Asleep         Complications: There were no known notable events for this encounter.

## 2024-02-25 NOTE — Anesthesia Postprocedure Evaluation (Signed)
 Anesthesia Post Note  Patient: JOANTHAN HLAVACEK  Procedure(s) Performed: LEFT ANTERIOR LATERAL LUMBAR FUSION WITH PERCUTANEOUS PEDICLE SCREW LUMBAR TWO-LUMBAR THREE, LUMBAR THREE-LUMBAR FOUR, POSTERIOR LATERAL AND INTERBODY FUSION (Left)     Patient location during evaluation: PACU Anesthesia Type: General Level of consciousness: awake and alert Pain management: pain level controlled Vital Signs Assessment: post-procedure vital signs reviewed and stable Respiratory status: spontaneous breathing, nonlabored ventilation, respiratory function stable and patient connected to nasal cannula oxygen Cardiovascular status: blood pressure returned to baseline and stable Postop Assessment: no apparent nausea or vomiting Anesthetic complications: no   There were no known notable events for this encounter.  Last Vitals:  Vitals:   02/25/24 1400 02/25/24 1436  BP: 114/82 130/88  Pulse: 69 70  Resp: 12 20  Temp: (!) 36.4 C 37.2 C  SpO2: 95% 99%    Last Pain:  Vitals:   02/25/24 1508  TempSrc:   PainSc: 10-Worst pain ever                 Lynwood MARLA Cornea

## 2024-02-25 NOTE — H&P (Signed)
 Mason [name] is an 83 y.o. male.   Chief Complaint: Back pain HPI: 83 year old male status post prior lumbar decompressive surgery presents with worsening back and bilateral lower extremity symptoms right greater than left.  Workup demonstrates evidence of marked disc degeneration with disc space collapse and degenerative retrolisthesis with foraminal stenosis and moderate lateral recess stenosis at L2-3.  Patient also with marked disc degeneration and vacuum disc phenomenon at L3-4 with some suggestion of instability.  Patient with significant foraminal stenosis at this level as well.  Patient has failed conservative management presents now for left-sided L2-3 and L3-4 anterior lateral retroperitoneal interbody decompression and fusion utilizing interbody cages and morselized allograft with posterior percutaneous pedicle screw fixation in hopes improving his symptoms.  Past Medical History:  Diagnosis Date   AICD (automatic cardioverter/defibrillator) present 2018   MDT CRT-D.  Fatigue-->completely pacer dependent.  Pacer settings adjusted 12/2017 to allow more chronotrophc variance with ADL's//exertion.   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   02/2016 EF 40%, diffuse hypokinesis, grade 2 DD.  Myoc perf imaging showed EF 32% 04/2016.  Pt upgraded to CRT-D 01/04/17.   Chronic renal insufficiency, stage III (moderate) (HCC) 2015   CrCl about 60 ml/min   Complete heart block (HCC)    Has dual chamber pacer.   COVID-19 virus infection 01/05/2021   paxlovid   Depression    DOE (dyspnea on exertion)    NYHA class II/III CHF   Dyspnea 2021   with exertion, bending over    Dysrhythmia    A. Fib   Episodic low back pain 01/22/2013   w/intermittent radiculitis (12/2014 his neurologist referred him to pain mgmt for epidural steroid injection)   Erectile dysfunction 2019   due to zoloft --urol rx'd viagra   GERD (gastroesophageal reflux disease)    H/O tilt table evaluation 11/02/2005   negative   Helicobacter pylori gastritis 01/2016   History of adenomatous polyp of colon 10/12/2011   Dr. Rollin (3 right side of colon- tubular adenomas removed)   History of cardiovascular stress test 05/28/2012   no ischemia, EF 37%, imaging results are unchanged and within normal variance   History of chronic prostatitis    History of kidney stones    History of vertigo    + Hx of posterior HA's.  Neuro (Dr. Maurice) eval 2011.  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   Hyperlipidemia    Hypertension    Lumbar spondylosis    lumbosacral radiculopathy at L4 by EMG testing, right foot drop (neurologist is Dr. Birder with [facility] in [place])--neurologist referred him to neurosurgery   Migraine    used to have them all the time; none for years (01/04/2017)   Myocardial infarction [facility]) ?1970s   not entirely certain of this   Nephrolithiasis 07/2012   Left UVJ 2 mm stone with dilation of renal collecting system and slight hydroureter on right   Neuropathy    NICM (nonischemic cardiomyopathy) (HCC)    a. 02/2018 Cath: LM nl, LAD min irregs, LCX no, RCA 20d. [id number]. Fick CO/CI 4.4/2.0.   Osteoarthritis, multiple sites  Shoulders, back, knees   Pacemaker 02/05/2012   dual chamber, complete heart block, meddtronic revo, lasted checked 12/2015.  Since no CAD on cath 05/2016, cards recommends upgrade to CRT-D.   Peripheral vascular disease (HCC)    Peripheral Artery Disease   Permanent atrial fibrillation (HCC)    DCCV 07/09/13-converted, lasted two days, then back into afib--needs  lifetime anticoagulation (Xarelto  as of 09/2014)   Prostate cancer screening 09/2017   done by urol annually (normal prostate exam documented + PSA 0.84 as of 10/01/17 urol f/u.  10/2018 urol f/u PSA 0.6, no prostate nodule.   Rectus diastasis    Right ankle sprain 08/2017   w/distal fibula avulsion fx noted on u/s but not plain film-(Dr. Hudnall).   Skin cancer of arm, left    burned it off (01/04/2017)   TIA (transient ischemic attack)    L face and L arm weakness. Peri procedural->a. 03/22/2018 following cath. CT head neg. No MRI b/c has pacer. Likely due to embolus to distal branch of RMCA   Type II diabetes mellitus (HCC)     Past Surgical History:  Procedure Laterality Date   ABI's Bilateral 05/21/2018   normal   BACK SURGERY     BIV ICD INSERTION CRT-D N/A 01/04/2017   Procedure: BiV ICD ;  Surgeon: Inocencio Soyla Lunger, MD;  Location: [facility] INVASIVE CV LAB;  Service: Cardiovascular;  Laterality: N/A;   CARDIAC CATHETERIZATION N/A 06/14/2016   Minimal nonobstructive dz, EF 25-35%.  Procedure: Left Heart Cath and Coronary Angiography;  Surgeon: Peter M Swaziland, MD;  Location: [facility] INVASIVE CV LAB;  Service: Cardiovascular;  Laterality: N/A;   CARDIOVASCULAR STRESS TEST  2012   2012 nuclear perfusion study: low risk scan; 04/2016 normal myocardial perfusion imaging, EF 32%.   CARDIOVERSION  07/09/2012   Procedure: CARDIOVERSION;  Surgeon: Jerel Balding, MD;  Location: [facility] ENDOSCOPY;  Service: Cardiovascular;  Laterality: N/A;   CATARACT EXTRACTION W/ INTRAOCULAR LENS IMPLANT & ANTERIOR VITRECTOMY, BILATERAL Bilateral    CIRCUMCISION N/A 03/23/2020   Procedure: CIRCUMCISION ADULT;  Surgeon: Rosalind Zachary NOVAK, MD;  Location: [facility] ORS;  Service: Urology;  Laterality: N/A;   COLONOSCOPY W/ POLYPECTOMY  approx 2006; repeated 09/2011   Polyps on 2013 EGD as well, repeat 12/2014   COLONOSCOPY WITH PROPOFOL  N/A 07/08/2021   adenoma x 1. Procedure: COLONOSCOPY WITH PROPOFOL ;  Surgeon: Rollin Dover,  MD;  Location: [facility] ENDOSCOPY;  Service: Endoscopy;  Laterality: N/A;   ESOPHAGEAL DILATION  10/19/2023   Procedure: DILATION, ESOPHAGUS;  Surgeon: Rollin Dover, MD;  Location: [facility]OSCOPY;  Service: Gastroenterology;;   ESOPHAGOGASTRODUODENOSCOPY  10/18/2006   Done due to chronic GERD: Normal, bx showed no barrett's esophagus (Dr. Rollin)   EYE SURGERY  01/08/2023   FLEXOR TENDON REPAIR Left 10/02/2016   Procedure: LEFT RING FINGER WOUND EXPORATION AND FLEXOR TENDON REPAIR AND NERVE REPAIR;  Surgeon: Alm Hummer, MD;  Location: [facility] OR;  Service: Orthopedics;  Laterality: Left;   INSERT / REPLACE / REMOVE PACEMAKER  02/05/2012   dual chamber, sinus node dysfunction, sinus arrest, PAF, Medtronic Revo serial#-[id number] H: last checked 05/2015   LUMBAR LAMINECTOMY Left 1976   L4-5   PACEMAKER REMOVAL  01/04/2017   PERMANENT PACEMAKER INSERTION N/A 02/05/2012   Procedure: PERMANENT PACEMAKER INSERTION;  Surgeon: Jerel Balding, MD; Generator Medtronic Bryant model [place] serial number [id number] H Laterality: N/A;   POLYPECTOMY  07/08/2021   Procedure: POLYPECTOMY;  Surgeon: Rollin Dover, MD;  Location: [facility];  Service: Endoscopy;;   RETINAL DETACHMENT SURGERY  Left ~ 1999   REVERSE SHOULDER ARTHROPLASTY Left 2018   Left shoulder reverse TSA ([facility] in [place]).   RIGHT/LEFT HEART CATH AND CORONARY ANGIOGRAPHY N/A 03/22/2018   EF 30-35%, no CAD.  Procedure: RIGHT/LEFT HEART CATH AND CORONARY ANGIOGRAPHY;  Surgeon: Cherrie Toribio SAUNDERS, MD;  Location: [facility] INVASIVE CV LAB;  Service: Cardiovascular;  Laterality: N/A;   SAVORY DILATION N/A 10/19/2023   Procedure: EGD, WITH DILATION USING SAVARY-GILLIARD DILATOR OVER GUIDEWIRE;  Surgeon: Rollin Dover, MD;  Location: [facility];  Service: Gastroenterology;  Laterality: N/A;   TRANSTHORACIC ECHOCARDIOGRAM  08/25/10; 05/2012; 03/23/16;12/2017   mild asymmetric LVH, normal systolic function, normal diastolic fxn, mild-to-mod mitral regurg, mild  aortic valve sclerosis and trace AI, mild aortic root dilatation. 2014 f/u showed EF 40-45%, mod LAE, A FIB.  02/2016 EF 40%, diffuse hypokinesis, grade 2 DD. 12/2017 EF 35-40%,diffuse hypokin,grd III DD, mild MR   WISDOM TOOTH EXTRACTION  06/27/2021   EF 50-55%, mild aortic root/ascending aorta dilatation (41-42 mm).    Family History  Problem Relation Age of Onset   Heart failure Mother    Stroke Mother    Stroke Father    Heart disease Sister    Heart disease Sister    Cancer Sister        liver   Cancer Brother        lung   Cancer Brother        lung   Heart disease Brother    Social History:  reports that he has never smoked. He has never used smokeless tobacco. He reports current alcohol use of about 3.0 - 4.0 standard drinks of alcohol per week. He reports that he does not use drugs.  Allergies: No Known Allergies  Medications Prior to Admission  Medication Sig Dispense Refill   COMBIGAN  0.2-0.5 % ophthalmic solution Place 1 drop into both eyes every 12 (twelve) hours.     diclofenac  Sodium (VOLTAREN ) 1 % GEL Apply 4 g topically 4 (four) times daily. (Patient taking differently: Apply 4 g topically 4 (four) times daily as needed (pain).) 100 g 1   ENTRESTO  97-103 MG TAKE 1 TABLET TWO TIMES A DAY 180 tablet 3   eplerenone  (INSPRA ) 25 MG tablet TAKE 1 TABLET DAILY 90 tablet 3   gabapentin  (NEURONTIN ) 300 MG capsule TAKE 2 CAPSULES TWICE A DAY 360 capsule 3   insulin  aspart (NOVOLOG  FLEXPEN) 100 UNIT/ML FlexPen Inject 10 units before each meal (Patient taking differently: Inject 0-12 Units into the skin 3 (three) times daily with meals. Sliding scale) 30 mL 3   insulin  glargine (LANTUS  SOLOSTAR) 100 UNIT/ML Solostar Pen Inject 30-34 Units into the skin daily. (Patient taking differently: Inject 28 Units into the skin at bedtime.)     JARDIANCE  10 MG TABS tablet TAKE 1 TABLET DAILY 90 tablet 3   latanoprost  (XALATAN ) 0.005 % ophthalmic solution Place 1 drop into both eyes at  bedtime.     lidocaine  (LIDODERM ) 5 % PLACE 1 PATCH ON THE SKIN DAILY. REMOVE AND DISCARD PATCH WITHIN 12 HOURS OR AS DIRECTED BY DOCTOR (Patient taking differently: Place 1 patch onto the skin daily as needed (pain).) 90 patch 0   meclizine  (ANTIVERT ) 25 MG tablet TAKE 1 TABLET THREE TIMES A DAY AS NEEDED FOR DIZZINESS (Patient taking differently: Take 25 mg by mouth 3 (three) times daily.) 270 tablet 1   metoprolol  succinate (TOPROL -XL) 50 MG 24 hr tablet TAKE 1 TABLET DAILY. TAKE WITH OR IMMEDIATELY FOLLLOWING A MEAL  90 tablet 0   omeprazole  (PRILOSEC) 40 MG capsule Take 40 mg by mouth daily.     oxybutynin  (DITROPAN -XL) 10 MG 24 hr tablet Take 1 tablet (10 mg total) by mouth at bedtime. 90 tablet 0   oxyCODONE -acetaminophen  (PERCOCET) 10-325 MG tablet 1-2 tabs po tid prn pain 90 tablet 0   rOPINIRole  (REQUIP ) 1 MG tablet Take 1 tablet (1 mg total) by mouth at bedtime. 90 tablet 3   rosuvastatin  (CRESTOR ) 20 MG tablet Take 20 mg by mouth at bedtime.     sertraline  (ZOLOFT ) 100 MG tablet TAKE 1 TABLET DAILY 90 tablet 0   XARELTO  20 MG TABS tablet TAKE 1 TABLET DAILY WITH SUPPER 90 tablet 3   BD INSULIN  SYRINGE U/F 31G X 5/16 1 ML MISC USE DAILY AS DIRECTED 100 each 3   Continuous Blood Gluc Receiver (FREESTYLE LIBRE 2 READER) DEVI 1 each by Does not apply route daily. 1 each 3   Continuous Blood Gluc Sensor (FREESTYLE LIBRE 2 SENSOR) MISC 1 each by Does not apply route every 14 (fourteen) days. 6 each 3   Semaglutide , 1 MG/DOSE, 4 MG/3ML SOPN Inject 1 mg as directed once a week. (Patient not taking: Reported on 02/12/2024) 9 mL 3   SURE COMFORT PEN NEEDLES 31G X 5 MM MISC USE TO INJECT INSULIN  UNDER THE SKIN 100 each 11    Results for orders placed or performed during the hospital encounter of 02/25/24 (from the past 48 hours)  Glucose, capillary     Status: Abnormal   Collection Time: 02/25/24  6:18 AM  Result Value Ref Range   Glucose-Capillary 156 (H) 70 - 99 mg/dL    Comment: Glucose  reference range applies only to samples taken after fasting for at least 8 hours.  ABO/Rh     Status: None (Preliminary result)   Collection Time: 02/25/24  7:13 AM  Result Value Ref Range   ABO/RH(D) PENDING    *Note: Due to a large number of results and/or encounters for the requested time period, some results have not been displayed. A complete set of results can be found in Results Review.   No results found.  Pertinent items noted in HPI and remainder of comprehensive ROS otherwise negative.  Blood pressure 102/66, pulse 91, temperature 98.5 F (36.9 C), temperature source Oral, resp. rate 18, height 5' 11 (1.803 m), weight 95.3 kg, SpO2 98%.  Patient is awake and alert.  He is oriented and appropriate.  Speech is fluent.  Judgment insight intact cranial nerve function intact bilaterally.  Motor and sensory function of the extremities reveals some mild proximal weakness bilaterally.  Sensory examination with patchy distal sensory loss in both distal lower extremities.  Examination head ears eyes nose and throat is unremarkable chest and abdomen are benign.  Extremities are free from injury deformity.  Assessment/Plan L2-3, L3-4 degenerative retrolisthesis with foraminal stenosis.  Plan left L2-3 and L3-4 anterior lateral retroperitoneal interbody decompression and fusion utilizing interbody cages,/allograft, and augmented with posterior arthrodesis utilizing segmental pedicle screw fixation done percutaneously.  Risks and benefits been explained.  Patient wishes proceed.  [facility] Holly Pring 02/25/2024, 7:52 AM

## 2024-02-25 NOTE — Progress Notes (Signed)
 Orthopedic Tech Progress Note Patient Details:  [name] [birth year] [id number]  Patient has back brace   Patient ID: Mason [name], male   DOB: March 06, 1941, 83 y.o.   MRN: [id number]  Delanna LITTIE Pac 02/25/2024, 2:45 PM

## 2024-02-25 NOTE — Brief Op Note (Signed)
 02/25/2024  10:52 AM  PATIENT:  Mason [name]  83 y.o. male  PRE-OPERATIVE DIAGNOSIS:  Lumbar radiculopathy  POST-OPERATIVE DIAGNOSIS:  Lumbar radiculopathy  PROCEDURE:  Procedure(s) with comments: LEFT ANTERIOR LATERAL LUMBAR FUSION WITH PERCUTANEOUS PEDICLE SCREW LUMBAR TWO-LUMBAR THREE, LUMBAR THREE-LUMBAR FOUR, POSTERIOR LATERAL AND INTERBODY FUSION (Left) - XLIF - left - L2-L3 - L3-L4 with perc pedicle screws - Posterior Lateral and Interbody fusion  SURGEON:  Surgeons and Role:    * Louis Shove, MD - Primary    * Gillie Duncans, MD - Assisting  PHYSICIAN ASSISTANT:   ASSISTANTSBETHA Jennetta PIETY   ANESTHESIA:   general  EBL: 30 cc  BLOOD ADMINISTERED:none  DRAINS: none   LOCAL MEDICATIONS USED:  MARCAINE      SPECIMEN:  No Specimen  DISPOSITION OF SPECIMEN:  N/A  COUNTS:  YES  TOURNIQUET:  * No tourniquets in log *  DICTATION: .Dragon Dictation  PLAN OF CARE: Admit to inpatient   PATIENT DISPOSITION:  PACU - hemodynamically stable.   Delay start of Pharmacological VTE agent (>24hrs) due to surgical blood loss or risk of bleeding: yes

## 2024-02-25 NOTE — Anesthesia Procedure Notes (Signed)
 Procedure Name: Intubation Date/Time: 02/25/2024 8:17 AM  Performed by: Worth Catherene Flores, CRNAPre-anesthesia Checklist: Patient identified, Emergency Drugs available, Suction available and Patient being monitored Patient Re-evaluated:Patient Re-evaluated prior to induction Oxygen Delivery Method: Circle system utilized Preoxygenation: Pre-oxygenation with 100% oxygen Induction Type: IV induction Ventilation: Mask ventilation without difficulty Laryngoscope Size: Mac and 4 Grade View: Grade II Tube type: Oral Tube size: 7.5 mm Number of attempts: 1 Airway Equipment and Method: Stylet and Oral airway Placement Confirmation: ETT inserted through vocal cords under direct vision, positive ETCO2 and breath sounds checked- equal and bilateral Secured at: 23 cm Tube secured with: Tape Dental Injury: Teeth and Oropharynx as per pre-operative assessment

## 2024-02-25 NOTE — Op Note (Signed)
 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  Asst. Surgeon: Gillie, MD; Jennetta, NP  Anesthesia: General  Indication: 83 year old male status post previous lumbar decompressive surgery presents with worsening back pain and bilateral lower extremity symptoms right greater than left.  Workup demonstrates evidence of spontaneous fusion at the L1-2 level.  Patient with marked disc degeneration with degenerative retrolisthesis and severe foraminal stenosis at L2-3 bilaterally.  Central canal reasonably patent.  At L3-4 patient also with marked degenerative disc disease with vacuum disc phenomenon and some degenerative retrolisthesis with foraminal stenosis.  Patient presents now for L2-3 and L3-4 decompression and fusion in hopes improving his symptoms.  Operative note: After induction of anesthesia, patient position in the right lateral decubitus position and appropriately padded.  Fluoroscopy was used to align the spine and the patient was secured in place.  The bed was flexed to allow easier aspect into the lateral disc bases at L2-3 and L3-4.  Left flank and lumbar region prepped and draped sterilely.  Incision made overlying the midpoint of the L3 body on the left.  A secondary incision was then made in the posterior left flank.  Using the posterior left flank incision blunt dissection was made into the retroperitoneal space.  Peritoneal sac and contents were reflected and mobilized anteriorly.  Returning to the lateral incision a dilator was then placed through the incision into the disc space on the left  at L2-3.  This was heavily overgrown with lateral osteophyte.  Intraoperative direct stimulation was performed as well as constant neuromonitoring throughout the case.  The dilator was found to be in safe position and not adjacent to any structures of the lumbar plexus.  A guidewire was then passed into the disc space of L2-3.  The dilators were sequentially enlarged again directly stimulating throughout and confirming safe passage.  Self-retaining retractor was then placed over the dilators and widened slightly.  The lateral disc base was examined.  Once again there was a large overhanging osteophyte.  Psoas muscle was dissected from the underlying disc base and osteophyte.  Direct stimulation was performed using electrode and again no adjacent neural structures were identified.  A chisel was then used to remove a lateral osteophyte and expose the lateral disc space.  The disc space was entered and discectomy was performed using various instruments.  Contralateral release was performed with Cobb elevators both along the superior endplate and inferior endplate.  Soft tissue was removed from the interspace.  Endplates were prepared.  Disc base was progressively dilated and a 10 mm lordotic implant was found to be appropriate.  A 10 mm lordotic by 18 mm x 55 mm 3D printed titanium  Cage was then packed with Osteocel plus.  This is then impacted into the lateral disc space crossing to the far lateral margin.  This was found to be in good position both the AP and lateral plane with good reduction of his deformity and marked elevation of his neural foramina bilaterally.  The applier was removed.  The lateral disc base was irrigated.  Retractor system was removed.  Attention then placed to the L3-4 space.  Once again a dilator was passed through the lateral abdominal wall in a  retroperitoneal fashion docking into the lateral disc space.  This is confirmed to be in safe position with neuromonitoring.  The dilator was  sequentially dilated and again safe passage was assured.  The self-retaining tractor was placed.  The self-retaining tractor was opened.  The disc base was dissected free.  Direct stimulation over the disc space confirmed safe approach with no adjacent neural structures.  The shim was then impacted to the disc base laterally and the distractor was widened further.  Lateral disc base was incised using a 15 blade.  Discectomy performed using various instruments.  Contralateral release was performed with Cobb elevators.  Disc base was sequentially dilated and again a 10 mm lordotic cage was found to have a snug fit with good reduction of his deformity.  Endplates were prepared.  A 10 x 18 x 55 mm NuVasive cage was packed with Osteocel plus and impacted into place laterally.  Position was confirmed in both the AP and lateral plane.  Applier and retractor system was removed.  The bed was returned to a neutral position.  Pedicles of L2-L3 and L4 were identified using fluoroscopy.  Stab incisions were made overlying the L2-L3 and L4 pedicles.  The Jamshidi needle was used to was then passed into the pedicle under fluoroscopic guidance with again use of direct neuromonitoring.  Guidewire was passed through the Jamshidi into the vertebral body of L2 and L3 and L4.  Jamshidi needles received were removed.  Each pedicle was then tapped under fluoroscopic guidance again with neuromonitoring.  6.5 mm x 45 mm NuVasive screws were then placed at L2-L3 and L4 on the left side.  Short segment titanium rod was then passed through the screw towers overlying the screw heads.  Locking caps were then applied.  Locking caps were given the final tightening.  Screw towers were removed.  Final images revealed good position of the cages and the hardware at the proper level with normal alignment of the spine.  Wounds were irrigated 1 final time.  Wounds were then closed in layers with Vicryl sutures.  Steri-Strips and sterile dressing were  applied.  No apparent complications.  Patient tolerated the procedure well and he returns to the recovery room postop.

## 2024-02-26 ENCOUNTER — Encounter (HOSPITAL_COMMUNITY): Payer: Self-pay | Admitting: Neurosurgery

## 2024-02-26 LAB — GLUCOSE, CAPILLARY
Glucose-Capillary: 149 mg/dL — ABNORMAL HIGH (ref 70–99)
Glucose-Capillary: 120 mg/dL — ABNORMAL HIGH (ref 70–99)
Glucose-Capillary: 128 mg/dL — ABNORMAL HIGH (ref 70–99)
Glucose-Capillary: 157 mg/dL — ABNORMAL HIGH (ref 70–99)

## 2024-02-26 MED ORDER — DEXAMETHASONE SODIUM PHOSPHATE 4 MG/ML IJ SOLN
4.0000 mg | Freq: Four times a day (QID) | INTRAMUSCULAR | Status: AC
  Administered 2024-02-26 (×2): 4 mg via INTRAVENOUS
  Filled 2024-02-26 (×2): qty 1

## 2024-02-26 NOTE — Evaluation (Signed)
 Occupational Therapy Evaluation Patient Details Name: Mason [name] MRN: [id number] DOB: 03-Sep-1940 Today's Date: 02/26/2024   History of Present Illness   The pt is an 83 yo male presenting 8/4 for L2-3 and L3-4 decompression and fusion due to chronic low back and bilateral LE pain. PMH includes: AICD, aortic aneurysm, CHF, complete heart block, afib, vertigo, NICM, arthritis, PVD, and DM II.     Clinical Impressions Patient admitted for the procedure above.  PTA he lives at home with his spouse and daughter, and will have the needed assist.  Currently he is sore, which is impacting ADL independence and in room mobility/toileting.  Min A with lower body ADL from sit to stand and generalized supervision with mobility at RW level.  Despite assist from OT, patient should progress well once discomfort subsides.  No further OT needs in the acute setting, and recommend follow up as prescribed by MD.  Good understanding of back precautions and sheet given.       If plan is discharge home, recommend the following:   Assist for transportation;A little help with bathing/dressing/bathroom     Functional Status Assessment   Patient has had a recent decline in their functional status and demonstrates the ability to make significant improvements in function in a reasonable and predictable amount of time.     Equipment Recommendations   None recommended by OT     Recommendations for Other Services         Precautions/Restrictions   Precautions Precautions: Back Precaution Booklet Issued: Yes (comment) Recall of Precautions/Restrictions: Intact Required Braces or Orthoses: Spinal Brace Restrictions Weight Bearing Restrictions Per Provider Order: No     Mobility Bed Mobility Overal bed mobility: Needs Assistance Bed Mobility: Sidelying to Sit, Sit to Sidelying   Sidelying to sit: Supervision     Sit to sidelying: Supervision      Transfers Overall transfer level: Needs  assistance Equipment used: Rolling walker (2 wheels) Transfers: Sit to/from Stand, Bed to chair/wheelchair/BSC Sit to Stand: Supervision     Step pivot transfers: Supervision            Balance Overall balance assessment: Needs assistance Sitting-balance support: Feet supported Sitting balance-Leahy Scale: Good     Standing balance support: Reliant on assistive device for balance Standing balance-Leahy Scale: Fair                             ADL either performed or assessed with clinical judgement   ADL                       Lower Body Dressing: Minimal assistance;Sit to/from stand   Toilet Transfer: Supervision/safety;Regular Toilet;Rolling walker (2 wheels);Ambulation                   Vision Baseline Vision/History: 1 Wears glasses Patient Visual Report: No change from baseline       Perception Perception: Not tested       Praxis Praxis: Not tested       Pertinent Vitals/Pain Pain Assessment Pain Assessment: Faces Faces Pain Scale: Hurts even more Pain Location: Incisional Pain Descriptors / Indicators: Guarding, Grimacing Pain Intervention(s): Monitored during session     Extremity/Trunk Assessment Upper Extremity Assessment Upper Extremity Assessment: Overall WFL for tasks assessed   Lower Extremity Assessment Lower Extremity Assessment: Defer to PT evaluation   Cervical / Trunk Assessment Cervical / Trunk Assessment: Back Surgery  Communication Communication Communication: No apparent difficulties   Cognition Arousal: Alert Behavior During Therapy: WFL for tasks assessed/performed Cognition: No apparent impairments                               Following commands: Intact       Cueing  General Comments   Cueing Techniques: Verbal cues   VSS on RA   Exercises     Shoulder Instructions      Home Living Family/patient expects to be discharged to:: Private residence Living Arrangements:  Spouse/significant other;Children Available Help at Discharge: Family;Available 24 hours/day Type of Home: House Home Access: Stairs to enter [employer] of Steps: 3 Entrance Stairs-Rails: Right;Left;Can reach both Home Layout: One level     Bathroom Shower/Tub: Producer, television/film/video: Standard     Home Equipment: None          Prior Functioning/Environment Prior Level of Function : Independent/Modified Independent;Driving                    OT Problem List: Pain   OT Treatment/Interventions:        OT Goals(Current goals can be found in the care plan section)   Acute Rehab OT Goals Patient Stated Goal: Return home OT Goal Formulation: With patient Time For Goal Achievement: 02/29/24 Potential to Achieve Goals: Good   OT Frequency:       Co-evaluation              AM-PAC OT 6 Clicks Daily Activity     Outcome Measure Help from another person eating meals?: None Help from another person taking care of personal grooming?: None Help from another person toileting, which includes using toliet, bedpan, or urinal?: A Little Help from another person bathing (including washing, rinsing, drying)?: A Little Help from another person to put on and taking off regular upper body clothing?: None Help from another person to put on and taking off regular lower body clothing?: A Little 6 Click Score: 21   End of Session Equipment Utilized During Treatment: Rolling walker (2 wheels) Nurse Communication: Mobility status  Activity Tolerance: Patient tolerated treatment well Patient left: in bed;with call bell/phone within reach  OT Visit Diagnosis: Unsteadiness on feet (R26.81)                Time: 0840-0900 OT Time Calculation (min): 20 min Charges:  OT General Charges $OT Visit: 1 Visit OT Evaluation $OT Eval Moderate Complexity: 1 Mod  02/26/2024  RP, OTR/L  Acute Rehabilitation Services  Office:  [phone]   Charlie JONETTA Halsted 02/26/2024, 9:04 AM

## 2024-02-26 NOTE — Progress Notes (Signed)
 Postop day 1.  Patient overall doing reasonably well.  Still with quite a bit of incisional pain in his left flank and some soreness into his left proximal lower extremity.  No numbness or weakness.  Standing ambulating and voiding without difficulty.  Awake and alert.  Oriented and appropriate.  Motor and sensory function intact.  Vital signs are stable.  Urine output is good.  Overall progressing reasonably well.  I think the patient would benefit from an extra day of hospitalization and therapy.  Looking toward possible discharge tomorrow.

## 2024-02-26 NOTE — Evaluation (Signed)
 Physical Therapy Evaluation Patient Details Name: Mason [name] MRN: [id number] DOB: [birth year] Today's Date: 02/26/2024  History of Present Illness  The pt is an 83 yo male presenting 8/4 for L2-3 and L3-4 decompression and fusion due to chronic low back and bilateral LE pain. PMH includes: AICD, aortic aneurysm, CHF, complete heart block, afib, vertigo, NICM, arthritis, PVD, and DM II.   Clinical Impression  Pt in bed upon arrival of PT, agreeable to evaluation at this time. Prior to admission the pt was independent with mobility, reports no exercise or activity but able to manage at home without assistance. The pt now presents with limitations in functional mobility, strength, stability, and endurance due to above dx and resulting pain, and will continue to benefit from skilled PT to address these deficits. The pt required minA to manage bed mobility from flat bed, and benefits from [facility] for transfers and hallway ambulation with use of RW. The pt benefits from minA to CGA on stairs, and is dependent on BUE support at this time. Pt educated on spinal precautions, use of brace, progressive walking program, and car transfers with pt reporting understanding. Will continue to benefit from skilled PT acutely to progress independence with mobility and follow up therapy to further improve strength in LLE and return to mobility without UE support.      If plan is discharge home, recommend the following: A little help with walking and/or transfers;A little help with bathing/dressing/bathroom;Assist for transportation;Help with stairs or ramp for entrance   Can travel by private vehicle        Equipment Recommendations Rolling walker (2 wheels)  Recommendations for Other Services       Functional Status Assessment Patient has had a recent decline in their functional status and demonstrates the ability to make significant improvements in function in a reasonable and predictable amount of time.      Precautions / Restrictions Precautions Precautions: Back Precaution Booklet Issued: Yes (comment) Recall of Precautions/Restrictions: Impaired Precaution/Restrictions Comments: able to recall 1/3, verbally reviewed x3 and handout provided Required Braces or Orthoses: Spinal Brace Spinal Brace: Lumbar corset;Applied in sitting position Restrictions Weight Bearing Restrictions Per Provider Order: No      Mobility  Bed Mobility Overal bed mobility: Needs Assistance Bed Mobility: Sidelying to Sit, Sit to Sidelying, Rolling Rolling: Supervision Sidelying to sit: Min assist     Sit to sidelying: Supervision General bed mobility comments: cues for log roll, minA to elevate trunk from flat bed    Transfers Overall transfer level: Needs assistance Equipment used: Rolling walker (2 wheels) Transfers: Sit to/from Stand Sit to Stand: Contact guard assist           General transfer comment: cues for use of UE support    Ambulation/Gait Ambulation/Gait assistance: Contact guard assist Gait Distance (Feet): 100 Feet Assistive device: Rolling walker (2 wheels) Gait Pattern/deviations: Step-through pattern, Knee flexed in stance - left, Decreased stride length, Trunk flexed Gait velocity: decreased Gait velocity interpretation: <1.31 ft/sec, indicative of household ambulator   General Gait Details: limited extension of L knee in stance, cues for posture, stride length and clearance. no overt buckling  Stairs Stairs: Yes Stairs assistance: Min assist, Contact guard assist Stair Management: Two rails, Step to pattern, Forwards Number of Stairs: 4 General stair comments: RLE ascending and LLE descending. minA to steady     Balance Overall balance assessment: Needs assistance Sitting-balance support: Feet supported Sitting balance-Leahy Scale: Good     Standing balance support: Reliant on  assistive device for balance Standing balance-Leahy Scale: Fair Standing balance  comment: can remove UE from RW with static stance                             Pertinent Vitals/Pain Pain Assessment Pain Assessment: Faces Pain Score: 7  Faces Pain Scale: Hurts even more Pain Location: Incisional (L side) and L knee Pain Descriptors / Indicators: Guarding, Grimacing (loose in the knee) Pain Intervention(s): Limited activity within patient's tolerance, Monitored during session, Repositioned    Home Living Family/patient expects to be discharged to:: Private residence Living Arrangements: Spouse/significant other;Children Available Help at Discharge: Family;Available 24 hours/day Type of Home: House Home Access: Stairs to enter Entrance Stairs-Rails: Right;Left;Can reach both Entrance Stairs-Number of Steps: 3   Home Layout: One level Home Equipment: None      Prior Function Prior Level of Function : Independent/Modified Independent;Driving             Mobility Comments: no use of DME, no falls ADLs Comments: independent     Extremity/Trunk Assessment   Upper Extremity Assessment Upper Extremity Assessment: Defer to OT evaluation;Overall [facility] for tasks assessed    Lower Extremity Assessment Lower Extremity Assessment: LLE deficits/detail LLE Deficits / Details: limited by pain, grossly 4/5 other than L knee extension and hip flexion 3+/5 LLE: Unable to fully assess due to pain LLE Sensation: WNL LLE Coordination: WNL    Cervical / Trunk Assessment Cervical / Trunk Assessment: Back Surgery  Communication   Communication Communication: No apparent difficulties    Cognition Arousal: Alert Behavior During Therapy: WFL for tasks assessed/performed   PT - Cognitive impairments: Memory, No family/caregiver present to determine baseline, Problem solving, Safety/Judgement                       PT - Cognition Comments: no family present, suspect likely close to baseline but pt unable to recall precautions given in OT session,  needing increased cues for safety and use of DME Following commands: Intact       Cueing Cueing Techniques: Verbal cues     General Comments General comments (skin integrity, edema, etc.): VSS on RA    Exercises     Assessment/Plan    PT Assessment Patient needs continued PT services  PT Problem List Decreased strength;Decreased range of motion;Decreased activity tolerance;Decreased balance;Decreased mobility;Decreased safety awareness       PT Treatment Interventions DME instruction;Gait training;Stair training;Functional mobility training;Therapeutic activities;Therapeutic exercise;Balance training;Patient/family education    PT Goals (Current goals can be found in the Care Plan section)  Acute Rehab PT Goals Patient Stated Goal: return home PT Goal Formulation: With patient Time For Goal Achievement: 03/11/24 Potential to Achieve Goals: Good    Frequency Min 5X/week        AM-PAC PT 6 Clicks Mobility  Outcome Measure Help needed turning from your back to your side while in a flat bed without using bedrails?: None Help needed moving from lying on your back to sitting on the side of a flat bed without using bedrails?: A Little Help needed moving to and from a bed to a chair (including a wheelchair)?: A Little Help needed standing up from a chair using your arms (e.g., wheelchair or bedside chair)?: A Little Help needed to walk in hospital room?: A Little Help needed climbing 3-5 steps with a railing? : A Little 6 Click Score: 19    End of Session Equipment  Utilized During Treatment: Gait belt;Back brace Activity Tolerance: Patient tolerated treatment well;Patient limited by pain Patient left: in bed;with call bell/phone within reach;with bed alarm set Nurse Communication: Mobility status PT Visit Diagnosis: Unsteadiness on feet (R26.81);Muscle weakness (generalized) (M62.81);Other abnormalities of gait and mobility (R26.89);Pain Pain - Right/Left: Left Pain -  part of body: Knee    Time: 9063-9043 PT Time Calculation (min) (ACUTE ONLY): 20 min   Charges:   PT Evaluation $PT Eval Low Complexity: 1 Low   PT General Charges $$ ACUTE PT VISIT: 1 Visit         Izetta Call, PT, DPT   Acute Rehabilitation Department Office [phone] Secure Chat Communication Preferred  Izetta JULIANNA Call 02/26/2024, 11:03 AM

## 2024-02-27 LAB — GLUCOSE, CAPILLARY: Glucose-Capillary: 200 mg/dL — ABNORMAL HIGH (ref 70–99)

## 2024-02-27 MED ORDER — METHOCARBAMOL 500 MG PO TABS
500.0000 mg | ORAL_TABLET | Freq: Four times a day (QID) | ORAL | 1 refills | Status: DC | PRN
Start: 2024-02-27 — End: 2024-03-18

## 2024-02-27 MED ORDER — OXYCODONE HCL 10 MG PO TABS: 10.0000 mg | ORAL_TABLET | ORAL | 0 refills | Status: DC | PRN

## 2024-02-27 NOTE — Discharge Instructions (Signed)
 Wound Care Keep incision covered and dry for two days.  If you shower, cover incision with plastic wrap.  Do not put any creams, lotions, or ointments on incision. Leave steri-strips on back.  They will fall off by themselves. Activity Walk each and every day, increasing distance each day. No lifting greater than 5 lbs.  Avoid excessive neck motion. No driving for 2 weeks; may ride as a passenger locally. If provided with back brace, wear when out of bed.  It is not necessary to wear brace in bed. Diet Resume your normal diet.  Return to Work Will be discussed at you follow up appointment. Call Your Doctor If Any of These Occur Redness, drainage, or swelling at the wound.  Temperature greater than 101 degrees. Severe pain not relieved by pain medication. Incision starts to come apart. Follow Up Appt Call today for appointment in 1-2 weeks ([phone]) or for problems.  If you have any hardware placed in your spine, you will need an x-ray before your appointment.   Resume Xarelto  on Friday

## 2024-02-27 NOTE — Progress Notes (Signed)
 Pt doing well. Pt and family given D/C instructions with verbal understanding. Rx's were sent to the pharmacy by MD. Pt's incision is clean and dry with no sign of infection. Pt's IV was removed prior to D/C. Pt D/C'd home via wheelchair per MD order. Pt is stable @ D/C and has no other needs at this time. Rema Fendt, RN

## 2024-02-27 NOTE — Discharge Summary (Signed)
 Physician Discharge Summary  Patient ID: Mason [name] MRN: [id number] DOB/AGE: 1941/02/14 83 y.o.  Admit date: 02/25/2024 Discharge date: 02/27/2024  Admission Diagnoses:  Discharge Diagnoses:  Principal Problem:   Degenerative spondylolisthesis   Discharged Condition: good  Hospital Course: Patient admitted to the hospital where he underwent uncomplicated left-sided L2-3 and L3-4 retroperitoneal interbody decompression and fusion with posterior percutaneous fixation.  Postoperatively doing well.  Back pain well-controlled.  Preoperative lower extremity symptoms much improved.  Expected left hip posterior irritation and weakness which is mostly secondary to guarding.  Standing ambulating and voiding without difficulty.  Ready for discharge home.  Consults:   Significant Diagnostic Studies:   Treatments:   Discharge Exam: Blood pressure 94/60, pulse 69, temperature 98 F (36.7 C), temperature source Oral, resp. rate 18, height 5' 11 (1.803 m), weight 95.3 kg, SpO2 97%. Awake and alert.  Oriented and appropriate.  Motor and sensory function intact.  Wound clean and dry.  Chest and abdomen benign.  Disposition: Discharge disposition: 01-Home or Self Care        Allergies as of 02/27/2024   No Known Allergies      Medication List     TAKE these medications    BD Insulin  Syringe U/F 31G X 5/16 1 ML Misc Generic drug: Insulin  Syringe-Needle U-100 USE DAILY AS DIRECTED   chlorhexidine  4 % external liquid Commonly known as: HIBICLENS  Apply 15 mLs (1 Application total) topically as directed for 30 doses. Use as directed daily for 5 days every other week for 6 weeks.   Combigan  0.2-0.5 % ophthalmic solution Generic drug: brimonidine -timolol  Place 1 drop into both eyes every 12 (twelve) hours.   diclofenac  Sodium 1 % Gel Commonly known as: Voltaren  Apply 4 g topically 4 (four) times daily. What changed:  when to take this reasons to take this   Entresto  97-103  MG Generic drug: sacubitril -valsartan  TAKE 1 TABLET TWO TIMES A DAY   eplerenone  25 MG tablet Commonly known as: INSPRA  TAKE 1 TABLET DAILY   FreeStyle Libre 2 Reader Devi 1 each by Does not apply route daily.   FreeStyle Libre 2 Sensor Misc 1 each by Does not apply route every 14 (fourteen) days.   gabapentin  300 MG capsule Commonly known as: NEURONTIN  TAKE 2 CAPSULES TWICE A DAY   Jardiance  10 MG Tabs tablet Generic drug: empagliflozin  TAKE 1 TABLET DAILY   Lantus  SoloStar 100 UNIT/ML Solostar Pen Generic drug: insulin  glargine Inject 30-34 Units into the skin daily. What changed:  how much to take when to take this   latanoprost  0.005 % ophthalmic solution Commonly known as: XALATAN  Place 1 drop into both eyes at bedtime.   lidocaine  5 % Commonly known as: LIDODERM  PLACE 1 PATCH ON THE SKIN DAILY. REMOVE AND DISCARD PATCH WITHIN 12 HOURS OR AS DIRECTED BY DOCTOR What changed: See the new instructions.   meclizine  25 MG tablet Commonly known as: ANTIVERT  TAKE 1 TABLET THREE TIMES A DAY AS NEEDED FOR DIZZINESS What changed: See the new instructions.   methocarbamol  500 MG tablet Commonly known as: ROBAXIN  Take 1 tablet (500 mg total) by mouth every 6 (six) hours as needed for muscle spasms.   metoprolol  succinate 50 MG 24 hr tablet Commonly known as: TOPROL -XL TAKE 1 TABLET DAILY. TAKE WITH OR IMMEDIATELY FOLLLOWING A MEAL   mupirocin  ointment 2 % Commonly known as: BACTROBAN  Place 1 Application into the nose 2 (two) times daily for 60 doses. Use as directed 2 times daily for 5  days every other week for 6 weeks.   NovoLOG  FlexPen 100 UNIT/ML FlexPen Generic drug: insulin  aspart Inject 10 units before each meal What changed:  how much to take how to take this when to take this additional instructions   omeprazole  40 MG capsule Commonly known as: PRILOSEC Take 40 mg by mouth daily.   oxybutynin  10 MG 24 hr tablet Commonly known as: DITROPAN -XL Take  1 tablet (10 mg total) by mouth at bedtime.   Oxycodone  HCl 10 MG Tabs Take 1 tablet (10 mg total) by mouth every 3 (three) hours as needed for severe pain (pain score 7-10).   oxyCODONE -acetaminophen  10-325 MG tablet Commonly known as: PERCOCET 1-2 tabs po tid prn pain   rOPINIRole  1 MG tablet Commonly known as: REQUIP  Take 1 tablet (1 mg total) by mouth at bedtime.   rosuvastatin  20 MG tablet Commonly known as: CRESTOR  Take 20 mg by mouth at bedtime.   Semaglutide  (1 MG/DOSE) 4 MG/3ML Sopn Inject 1 mg as directed once a week.   sertraline  100 MG tablet Commonly known as: ZOLOFT  TAKE 1 TABLET DAILY   Sure Comfort Pen Needles 31G X 5 MM Misc Generic drug: Insulin  Pen Needle USE TO INJECT INSULIN  UNDER THE SKIN   Xarelto  20 MG Tabs tablet Generic drug: rivaroxaban  TAKE 1 TABLET DAILY WITH SUPPER               Durable Medical Equipment  (From admission, onward)           Start     Ordered   02/25/24 1438  DME Walker rolling  Once       Question:  Patient needs a walker to treat with the following condition  Answer:  Degenerative spondylolisthesis   02/25/24 1437   02/25/24 1438  DME 3 n 1  Once        02/25/24 1437             Signed: Victory A Azim Gillingham 02/27/2024, 8:22 AM

## 2024-02-27 NOTE — Progress Notes (Signed)
 Physical Therapy Treatment Patient Details Name: Mason [name] MRN: [id number] DOB: March 05, 1941 Today's Date: 02/27/2024   History of Present Illness The pt is an 83 yo male presenting 8/4 for L2-3 and L3-4 decompression and fusion due to chronic low back and bilateral LE pain. PMH includes: AICD, aortic aneurysm, CHF, complete heart block, afib, vertigo, NICM, arthritis, PVD, and DM II.    PT Comments  Pt making good progress, able to progress ambulation distance with less assistance and improved stability in LLE. Pt able to attempt ambulation with hallway rail instead of BUE support on RW, but reports increased pain in L knee with wt bearing. Recommend continued use of RW for balance and offloading L knee after return home and pt in agreement. Remainder of session spent working on LE exercises for improved strength, ROM, and activation. Handout provided. Pt continues to demo poor recall of spinal precautions, but reviewed x2 verbally and with handout.     If plan is discharge home, recommend the following: A little help with walking and/or transfers;A little help with bathing/dressing/bathroom;Assist for transportation;Help with stairs or ramp for entrance   Can travel by private vehicle        Equipment Recommendations  Rolling walker (2 wheels)    Recommendations for Other Services       Precautions / Restrictions Precautions Precautions: Back Precaution Booklet Issued: Yes (comment) Recall of Precautions/Restrictions: Impaired Precaution/Restrictions Comments: able to recall 0/3, verbally reviewed x2 and handout provided Required Braces or Orthoses: Spinal Brace Spinal Brace: Lumbar corset;Applied in sitting position Restrictions Weight Bearing Restrictions Per Provider Order: No     Mobility  Bed Mobility Overal bed mobility: Needs Assistance Bed Mobility: Sit to Sidelying         Sit to sidelying: Min assist General bed mobility comments: minA to bring LE into bed     Transfers Overall transfer level: Needs assistance Equipment used: Rolling walker (2 wheels) Transfers: Sit to/from Stand Sit to Stand: Contact guard assist, Supervision           General transfer comment: cues for use of UE support, then completed 2 x 5 from EOB without cues    Ambulation/Gait Ambulation/Gait assistance: Contact guard assist, Supervision Gait Distance (Feet): 250 Feet Assistive device: Rolling walker (2 wheels) (hallway rail) Gait Pattern/deviations: Step-through pattern, Knee flexed in stance - left, Decreased stride length, Trunk flexed Gait velocity: decreased Gait velocity interpretation: <1.31 ft/sec, indicative of household ambulator   General Gait Details: limited extension of L knee in stance, cues for posture, stride length and clearance. no overt buckling      Balance Overall balance assessment: Needs assistance Sitting-balance support: Feet supported Sitting balance-Leahy Scale: Good     Standing balance support: Reliant on assistive device for balance Standing balance-Leahy Scale: Good Standing balance comment: can remove UE from RW with static stance                            Communication Communication Communication: No apparent difficulties  Cognition Arousal: Alert Behavior During Therapy: WFL for tasks assessed/performed   PT - Cognitive impairments: Memory, No family/caregiver present to determine baseline, Problem solving, Safety/Judgement                       PT - Cognition Comments: no family present, suspect likely close to baseline but pt unable to recall precautions given in OT session, needing increased cues for safety and use  of DME Following commands: Intact      Cueing Cueing Techniques: Verbal cues  Exercises General Exercises - Lower Extremity Long Arc Quad: AROM, Left, 5 reps, Seated Hip Flexion/Marching: AROM, Left, 5 reps Heel Raises: AROM, Left, 10 reps Other Exercises Other  Exercises: sit-stand 2 x 5    General Comments General comments (skin integrity, edema, etc.): VSS on RA      Pertinent Vitals/Pain Pain Assessment Pain Assessment: Faces Pain Score: 4  Faces Pain Scale: Hurts a little bit Pain Location: Incisional (L side) and L knee Pain Descriptors / Indicators: Guarding, Grimacing (loose in the knee) Pain Intervention(s): Limited activity within patient's tolerance, Monitored during session, Repositioned     PT Goals (current goals can now be found in the care plan section) Acute Rehab PT Goals Patient Stated Goal: return home PT Goal Formulation: With patient Time For Goal Achievement: 03/11/24 Potential to Achieve Goals: Good Progress towards PT goals: Progressing toward goals    Frequency    Min 5X/week       AM-PAC PT 6 Clicks Mobility   Outcome Measure  Help needed turning from your back to your side while in a flat bed without using bedrails?: None Help needed moving from lying on your back to sitting on the side of a flat bed without using bedrails?: A Little Help needed moving to and from a bed to a chair (including a wheelchair)?: A Little Help needed standing up from a chair using your arms (e.g., wheelchair or bedside chair)?: A Little Help needed to walk in hospital room?: A Little Help needed climbing 3-5 steps with a railing? : A Little 6 Click Score: 19    End of Session Equipment Utilized During Treatment: Gait belt;Back brace Activity Tolerance: Patient tolerated treatment well;Patient limited by pain Patient left: in bed;with call bell/phone within reach;with bed alarm set Nurse Communication: Mobility status PT Visit Diagnosis: Unsteadiness on feet (R26.81);Muscle weakness (generalized) (M62.81);Other abnormalities of gait and mobility (R26.89);Pain Pain - Right/Left: Left Pain - part of body: Knee     Time: 0801-0824 PT Time Calculation (min) (ACUTE ONLY): 23 min  Charges:    $Gait Training: 8-22  mins $Therapeutic Exercise: 8-22 mins PT General Charges $$ ACUTE PT VISIT: 1 Visit                     Izetta Call, PT, DPT   Acute Rehabilitation Department Office [phone] Secure Chat Communication Preferred   Izetta JULIANNA Call 02/27/2024, 9:19 AM

## 2024-02-28 ENCOUNTER — Telehealth: Payer: Self-pay

## 2024-02-28 NOTE — Patient Instructions (Signed)
 Visit Information  Thank you for taking time to visit with me today. Please don't hesitate to contact me if I can be of assistance to you before our next scheduled telephone appointment.  Our next appointment is by telephone on 03/04/24  at 2 pm  Following is a copy of your care plan:   Goals Addressed             This Visit's Progress    VBCI Transitions of Care (TOC) Care Plan       Problems:  Recent Hospitalization for treatment of  degenerative spondylolisthesis.  Patient status post left-sided L2-3 and L3-4 retroperitoneal interbody decompression and fusion with posterior percutaneous fixation.  Knowledge Deficit Related to management of status post back surgery and psychosocial needs: depression like symptoms.   Goal:  Over the next 30 days, the patient will not experience hospital readmission  Interventions:  Transitions of Care: Durable Medical Equipment (DME) confirmed patient has ordered back brace and rolling walker.  Arranged PCP follow-up within 7 days (Care Guide Scheduled) Discussed and offered 30 Day TOC program - patient verbally agreed.  Medications reviewed and compliance discussed. Discussed and reviewed provider advised wound care.  Teach back method used to confirm patients understanding of wound care instructions.  Reviewed signs/ symptoms on infection Discussed upcoming provider visits Assessed patients support system Advised to notify provider for any new/ ongoing symptoms Advised to notify surgeon for infection like symptoms/ concerns.  Advised to call surgeon office to obtain date and time of post surgical visit.  Confirmed patient has transportation to his provider appointments.  Completed PHQ2/ PHQ9 to assess for depression symptom Referred patient to social worker for psychosocial needs/ follow up Discussed and offered 30 day TOC program- patient verbally agreed.  Message sent to primary care provider regarding these patient reported  issues:  Constipation  Burning / frequency with urination  Bilateral ears feeling clogged  Depression like symptoms PHQ2 - 6,  PHQ9 - 20  Increase in left knee pain post back surgery Blood sugar events of <70 at least 3 times per week. Patient reports blood sugars are ranging between 46-180.   Patient Self Care Activities:  Attend all scheduled provider appointments Call pharmacy for medication refills 3-7 days in advance of running out of medications Call provider office for new concerns or questions  Take medications as prescribed   Work with social worker to address your case management needs.  Call the surgeons office to obtain date and time of post surgery follow up visit Remember you have a hospital follow up visit with Dr. Aleene Hockey on 02/29/24 at 11:20.  Please arrive at least 10-15 minutes early.  Follow instructions regarding wound care to back incision. Have spouse monitor are for any signs of infection. Follow instructions regarding your activity level per surgeon advisement.   Plan:  Telephone follow up appointment with care management team member scheduled for:  03/04/24 at 2 pm with Alan Ee, RN case manager.         Patient verbalizes understanding of instructions and care plan provided today and agrees to view in MyChart. Active MyChart status and patient understanding of how to access instructions and care plan via MyChart confirmed with patient.     The patient has been provided with contact information for the care management team and has been advised to call with any health related questions or concerns.   Please call the care guide team at [phone] if you need to cancel or reschedule  your appointment.   Please call the Suicide and Crisis Lifeline: 988 call 1-800-273-TALK (toll free, 24 hour hotline) if you are experiencing a Mental Health or Behavioral Health Crisis or need someone to talk to.  Arvin Seip RN, BSN, CCM [employer], Population Health Case Manager Phone: [phone]

## 2024-02-28 NOTE — Transitions of Care (Post Inpatient/ED Visit) (Signed)
 02/28/2024  Name: Mason [name] MRN: [id number] DOB: 20-Mar-1941  Today's TOC FU Call Status: Today's TOC FU Call Status:: Successful TOC FU Call Completed TOC FU Call Complete Date: 02/28/24 Patient's Name and Date of Birth confirmed.  Transition Care Management Follow-up Telephone Call Date of Discharge: 02/27/24 Discharge Facility: [facility]) Type of Discharge: Inpatient Admission Primary Inpatient Discharge Diagnosis:: degenerative spondylolisthesis How have you been since you were released from the hospital?: Worse (Patient states,  I feel like crap.) Any questions or concerns?: Yes Patient Questions/Concerns:: Patient states he is having terrible pain in his left knee.  He reports taking his prescribed pain medication and muscle relaxer however states this only gives him a short period of relief then the pain is right back. Patient states he is having more pain in his left knee since the back surgery. Patient Questions/Concerns Addressed: Provided Patient Educational Materials  Items Reviewed: Did you receive and understand the discharge instructions provided?: Yes Medications obtained,verified, and reconciled?: Yes (Medications Reviewed) Any new allergies since your discharge?: No Dietary orders reviewed?: Yes Type of Diet Ordered:: diabetic diet Do you have support at home?: Yes People in Home [RPT]: spouse Name of Support/Comfort Primary Source: Montel Baptist  Medications Reviewed Today: Medications Reviewed Today     Reviewed by Taran Hable E, RN (Registered Nurse) on 02/28/24 at 1324  Med List Status: <None>   Medication Order Taking? Sig Documenting Provider Last Dose Status Informant  BD INSULIN  SYRINGE U/F 31G X 5/16 1 ML MISC [id number] Yes USE DAILY AS DIRECTED McGowen, Aleene DEL, MD  Active Self, Pharmacy Records  chlorhexidine  (HIBICLENS ) 4 % external liquid [id number]  Apply 15 mLs (1 Application total) topically as directed for 30 doses. Use as directed daily  for 5 days every other week for 6 weeks.  Patient not taking: Reported on 02/28/2024   Louis Shove, MD  Active   COMBIGAN  0.2-0.5 % ophthalmic solution [id number] Yes Place 1 drop into both eyes every 12 (twelve) hours. [provider]  Active Self, Pharmacy Records           Med Note ALAIN, [place] A   Fri Jun 02, 2016  1:39 PM)    Continuous Blood Gluc Receiver (FREESTYLE LIBRE 2 READER) [place] [id number] Yes 1 each by Does not apply route daily. Trixie File, MD  Active Self, Pharmacy Records  Continuous Blood Gluc Sensor (FREESTYLE LIBRE 2 SENSOR) [place] [id number] Yes 1 each by Does not apply route every 14 (fourteen) days. Trixie File, MD  Active Self, Pharmacy Records  diclofenac  Sodium (VOLTAREN ) 1 % GEL [id number] Yes Apply 4 g topically 4 (four) times daily. Candise Aleene DEL, MD  Active Self, Pharmacy Records  ENTRESTO  97-103 MG [id number] Yes TAKE 1 TABLET TWO TIMES A DAY Croitoru, Mihai, MD  Active Self, Pharmacy Records  eplerenone  (INSPRA ) 25 MG tablet [id number] Yes TAKE 1 TABLET DAILY Croitoru, Mihai, MD  Active Self, Pharmacy Records  gabapentin  (NEURONTIN ) 300 MG capsule [id number] Yes TAKE 2 CAPSULES TWICE A DAY McGowen, Aleene DEL, MD  Active Self, Pharmacy Records  insulin  aspart (NOVOLOG  FLEXPEN) 100 UNIT/ML FlexPen [id number] Yes Inject 10 units before each meal Trixie File, MD  Active Self, Pharmacy Records  insulin  glargine (LANTUS  SOLOSTAR) 100 UNIT/ML Solostar Pen [id number] Yes Inject 30-34 Units into the skin daily. Trixie File, MD  Active Self, Pharmacy Records  JARDIANCE  10 MG TABS tablet [id number] Yes TAKE 1 TABLET DAILY Trixie File, MD  Active Self, Pharmacy Records  latanoprost  (XALATAN )  0.005 % ophthalmic solution [id number] Yes Place 1 drop into both eyes at bedtime. [provider]  Active Self, Pharmacy Records  lidocaine  (LIDODERM ) 5 % [id number] Yes PLACE 1 PATCH ON THE SKIN DAILY. REMOVE AND DISCARD PATCH WITHIN 12 HOURS OR AS  DIRECTED BY DOCTOR McGowen, Aleene DEL, MD  Active Self, Pharmacy Records  meclizine  (ANTIVERT ) 25 MG tablet [id number] Yes TAKE 1 TABLET THREE TIMES A DAY AS NEEDED FOR DIZZINESS McGowen, Aleene DEL, MD  Active Self, Pharmacy Records           Med Note [place], [place] R   Tue Feb 12, 2024  1:31 PM) Takes on a scheduled basis  methocarbamol  (ROBAXIN ) 500 MG tablet [id number] Yes Take 1 tablet (500 mg total) by mouth every 6 (six) hours as needed for muscle spasms. Louis Shove, MD  Active   metoprolol  succinate (TOPROL -XL) 50 MG 24 hr tablet [id number] Yes TAKE 1 TABLET DAILY. TAKE WITH OR IMMEDIATELY FOLLLOWING A MEAL McGowen, Aleene DEL, MD  Active Self, Pharmacy Records  mupirocin  ointment (BACTROBAN ) 2 % [id number] Yes Place 1 Application into the nose 2 (two) times daily for 60 doses. Use as directed 2 times daily for 5 days every other week for 6 weeks. Louis Shove, MD  Active   omeprazole  (PRILOSEC) 40 MG capsule [id number] Yes Take 40 mg by mouth daily. [provider]  Active Self, Pharmacy Records  oxybutynin  (DITROPAN -XL) 10 MG 24 hr tablet [id number] Yes Take 1 tablet (10 mg total) by mouth at bedtime. Candise Aleene DEL, MD  Active Self, Pharmacy Records  oxyCODONE  10 MG TABS [id number] Yes Take 1 tablet (10 mg total) by mouth every 3 (three) hours as needed for severe pain (pain score 7-10). Louis Shove, MD  Active   oxyCODONE -acetaminophen  (PERCOCET) 10-325 MG tablet [id number]  1-2 tabs po tid prn pain  Patient not taking: Reported on 02/28/2024   McGowen, Philip H, MD  Active Self, Pharmacy Records  rOPINIRole  (REQUIP ) 1 MG tablet [id number] Yes Take 1 tablet (1 mg total) by mouth at bedtime. McGowen, Philip H, MD  Active   rosuvastatin  (CRESTOR ) 20 MG tablet [id number] Yes Take 20 mg by mouth at bedtime. [provider]  Active Self, Pharmacy Records  Semaglutide , 1 MG/DOSE, 4 MG/3ML SOPN [id number]  Inject 1 mg as directed once a week.  Patient not taking: Reported on 02/28/2024    Trixie File, MD  Active Self, Pharmacy Records  sertraline  (ZOLOFT ) 100 MG tablet [id number] Yes TAKE 1 TABLET DAILY McGowen, Aleene DEL, MD  Active Self, Pharmacy Records  SURE COMFORT PEN NEEDLES 31G X 5 MM MISC [id number] Yes USE TO INJECT INSULIN  UNDER THE SKIN McGowen, Aleene DEL, MD  Active Self, Pharmacy Records  XARELTO  20 MG TABS tablet [id number] Yes TAKE 1 TABLET DAILY WITH SUPPER Croitoru, Mihai, MD  Active Self, Pharmacy Records            Home Care and Equipment/Supplies: Were Home Health Services Ordered?: No Any new equipment or medical supplies ordered?: Yes (reports having back brace) Name of Medical supply agency?: patient states he is not sure Were you able to get the equipment/medical supplies?: Yes Do you have any questions related to the use of the equipment/supplies?: No  Functional Questionnaire: Do you need assistance with bathing/showering or dressing?: Yes Do you need assistance with meal preparation?: Yes Do you need assistance with eating?: No Do you have difficulty maintaining continence: No Do you need assistance with getting out of bed/getting out of  a chair/moving?: Yes Do you have difficulty managing or taking your medications?: No  Follow up appointments reviewed: PCP Follow-up appointment confirmed?: Yes Date of PCP follow-up appointment?: 02/29/24 Follow-up Provider: [facility] Follow-up appointment confirmed?: No Reason Specialist Follow-Up Not Confirmed: Patient has Specialist Provider Number and will Call for Appointment (patient state he is unsure of surgery follow up visit. He states he will call Dr. Marda office to obtain appointment date and time.) Do you need transportation to your follow-up appointment?: No Do you understand care options if your condition(s) worsen?: Yes-patient verbalized understanding  SDOH Interventions Today    Flowsheet Row Most Recent Value  SDOH Interventions   Food Insecurity  Interventions Intervention Not Indicated  Housing Interventions Intervention Not Indicated  Transportation Interventions Intervention Not Indicated  Utilities Interventions Intervention Not Indicated  Depression Interventions/Treatment  Medication, Currently on Treatment  [patient states he has seen a psychiatrist / counselor in the past.  He state he [facility] [employer] [facility]    Goals Addressed             This Visit's Progress    VBCI Transitions of Care (TOC) Care Plan       Problems:  Recent Hospitalization for treatment of  degenerative spondylolisthesis.  Patient status post left-sided L2-3 and L3-4 retroperitoneal interbody decompression and fusion with posterior percutaneous fixation.  Knowledge Deficit Related to management of status post back surgery and psychosocial needs: depression like symptoms.   Goal:  Over the next 30 days, the patient will not experience hospital readmission  Interventions:  Transitions of Care: Durable Medical Equipment (DME) confirmed patient has ordered back brace and rolling walker.  Arranged PCP follow-up within 7 days (Care Guide Scheduled) Discussed and offered 30 Day TOC program - patient verbally agreed.  Medications reviewed and compliance discussed. Discussed and reviewed provider advised wound care.  Teach back method used to confirm patients understanding of wound care instructions.  Reviewed signs/ symptoms on infection Discussed upcoming provider visits Assessed patients support system Advised to notify provider for any new/ ongoing symptoms Advised to notify surgeon for infection like symptoms/ concerns.  Advised to call surgeon office to obtain date and time of post surgical visit.  Confirmed patient has transportation to his provider appointments.  Completed PHQ2/ PHQ9 to assess for depression symptom Referred patient to social worker for psychosocial needs/ follow up Discussed and offered 30 day TOC program-  patient verbally agreed.  Message sent to primary care provider regarding these patient reported issues:  Constipation  Burning / frequency with urination  Bilateral ears feeling clogged  Depression like symptoms PHQ2 - 6,  PHQ9 - 20  Increase in left knee pain post back surgery Blood sugar events of <70 at least 3 times per week. Patient reports blood sugars are ranging between 46-180.   Patient Self Care Activities:  Attend all scheduled provider appointments Call pharmacy for medication refills 3-7 days in advance of running out of medications Call provider office for new concerns or questions  Take medications as prescribed   Work with social worker to address your case management needs.  Call the surgeons office to obtain date and time of post surgery follow up visit Remember you have a hospital follow up visit with Dr. Aleene Hockey on 02/29/24 at 11:20.  Please arrive at least 10-15 minutes early.  Follow instructions regarding wound care to back incision. Have spouse monitor are for any signs of infection. Follow instructions regarding your activity  level per surgeon advisement.   Plan:  Telephone follow up appointment with care management team member scheduled for:  03/04/24 at 2 pm with Alan Ee, RN case manager.           Discussed and offered 30 day TOC program.  Patient verbally agreed.  The patient has been provided with contact information for the care management team and has been advised to call with any health -related questions or concerns.  The patient verbalized understanding with current plan of care.  The patient is directed to their insurance card regarding availability of benefits coverage.    Arvin Seip RN, BSN, CCM [employer], Population Health Case Manager Phone: [phone]

## 2024-02-29 ENCOUNTER — Telehealth: Payer: Self-pay

## 2024-02-29 ENCOUNTER — Inpatient Hospital Stay: Admitting: Family Medicine

## 2024-02-29 NOTE — Progress Notes (Deleted)
 02/29/2024  CC: No chief complaint on file.   Patient is a 83 y.o. male who presents for  hospital follow up, specifically [facility] face-to-face visit. Dates hospitalized: 8/4 to 02/27/2024. Days since d/c from hospital: 2 days Patient was discharged from hospital to home. Reason for admission to hospital: Previously planned lumbar surgery Date of interactive (phone) contact with patient and/or caregiver: 02/28/2024.  I have reviewed patient's discharge summary plus pertinent specific notes, labs, and imaging from the hospitalization.    Currently :  {current status of patient, symptoms, etc}  Medication reconciliation was done today and patient {is not} {is} taking meds as recommended by discharging hospitalist/specialist.   Discharge medications:  PMH: Entresto  97/103 1 tab twice daily, eplerenone  25 mg daily, gabapentin  600 mg twice daily, Jardiance  10 mg a day, Lantus  30 to 34 units daily, latanoprost  ophthalmic drops, lidocaine  patches, meclizine  25 mg 3 times daily as needed, methocarbamol  500 mg every 6 hours as needed, Toprol -XL 50 mg a day, Bactroban  ointment, NovoLog  10 units before every meal, omeprazole  40 mg a day, oxybutynin  10 mg a day, oxycodone  10 mg every 3 hours as needed for pain, ropinirole  1 mg nightly, rosuvastatin  20 mg nightly, semaglutide  1 mg weekly, sertraline  100 mg a day, Xarelto  20 mg a day.  ROS: ***  Past Medical History:  Diagnosis Date   AICD (automatic cardioverter/defibrillator) present 2018   MDT CRT-D.  Fatigue-->completely pacer dependent.  Pacer settings adjusted 12/2017 to allow more chronotrophc variance with ADL's//exertion.   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   02/2016 EF 40%, diffuse hypokinesis, grade 2 DD.  Myoc perf imaging showed EF 32% 04/2016.  Pt upgraded to CRT-D 01/04/17.   Chronic renal insufficiency, stage III (moderate) (HCC) 2015   CrCl about 60 ml/min   Complete heart block (HCC)    Has dual chamber pacer.   COVID-19 virus infection 01/05/2021   paxlovid   Depression    DOE (dyspnea on exertion)    NYHA class II/III CHF   Dyspnea 2021   with exertion, bending over   Dysrhythmia    A. Fib   Episodic low back pain 01/22/2013   w/intermittent radiculitis (12/2014 his neurologist referred him to pain mgmt for epidural steroid injection)   Erectile dysfunction 2019   due to zoloft --urol rx'd viagra   GERD (gastroesophageal reflux disease)    H/O tilt table evaluation 11/02/2005   negative   Helicobacter pylori gastritis 01/2016   History of adenomatous polyp of colon 10/12/2011   Dr. Rollin (3 right side of colon- tubular adenomas removed)   History of cardiovascular stress test 05/28/2012   no ischemia, EF 37%, imaging results are unchanged and within normal variance   History of chronic prostatitis    History of kidney stones    History of vertigo    + Hx of posterior HA's.  Neuro (Dr. Maurice) eval 2011.  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   Hyperlipidemia    Hypertension    Lumbar spondylosis    lumbosacral radiculopathy at L4 by EMG testing, right foot drop (neurologist is Dr.  Crowell with [facility] in [place])--neurologist referred him to neurosurgery   Migraine    used to have them all the time; none for years (01/04/2017)   Myocardial infarction [place]) ?1970s   not entirely certain of this   Nephrolithiasis 07/2012   Left UVJ 2 mm stone with dilation of renal collecting system and slight hydroureter on right   Neuropathy    NICM (nonischemic cardiomyopathy)  (HCC)    a. 02/2018 Cath: LM nl, LAD min irregs, LCX no, RCA 20d. ERTE86. Fick CO/CI 4.4/2.0.   Osteoarthritis, multiple sites    Shoulders, back, knees   Pacemaker 02/05/2012   dual chamber, complete heart block, meddtronic revo, lasted checked 12/2015.  Since no CAD on cath 05/2016, cards recommends upgrade to CRT-D.   Peripheral vascular disease (HCC)    Peripheral Artery Disease   Permanent atrial fibrillation (HCC)    DCCV 07/09/13-converted, lasted two days, then back into afib--needs lifetime anticoagulation (Xarelto  as of 09/2014)   Prostate cancer screening 09/2017   done by urol annually (normal prostate exam documented + PSA 0.84 as of 10/01/17 urol f/u.  10/2018 urol f/u PSA 0.6, no prostate nodule.   Rectus diastasis    Right ankle sprain 08/2017   w/distal fibula avulsion fx noted on u/s but not plain film-(Dr. Hudnall).   Skin cancer of arm, left    burned it off (01/04/2017)   TIA (transient ischemic attack)    L face and L arm weakness. Peri procedural->a. 03/22/2018 following cath. CT head neg. No MRI b/c has pacer. Likely due to embolus to distal branch of RMCA   Type II diabetes mellitus (HCC)     PSH:  Past Surgical History:  Procedure Laterality Date   ABI's Bilateral 05/21/2018   normal   ANTERIOR LATERAL LUMBAR FUSION WITH PERCUTANEOUS SCREW 2 LEVEL Left 02/25/2024   Procedure: LEFT ANTERIOR LATERAL LUMBAR FUSION WITH PERCUTANEOUS PEDICLE SCREW LUMBAR TWO-LUMBAR THREE, LUMBAR THREE-LUMBAR FOUR, POSTERIOR LATERAL AND INTERBODY FUSION;  Surgeon: Louis Shove, MD;  Location: [facility] OR;  Service: Neurosurgery;  Laterality: Left;  XLIF - left - L2-L3 - L3-L4 with perc pedicle screws - Posterior Lateral and Interbody fusion   BACK SURGERY     BIV ICD INSERTION CRT-D N/A 01/04/2017   Procedure: BiV ICD ;  Surgeon: Inocencio Soyla Lunger, MD;  Location: [facility] INVASIVE CV LAB;  Service: Cardiovascular;  Laterality: N/A;   CARDIAC CATHETERIZATION N/A 06/14/2016   Minimal nonobstructive  dz, EF 25-35%.  Procedure: Left Heart Cath and Coronary Angiography;  Surgeon: Peter M Swaz[place]and, MD;  Location: [facility] INVASIVE CV LAB;  Service: Cardiovascular;  Laterality: N/A;   CARDIOVASCULAR STRESS TEST  2012   2012 nuclear perfusion study: low risk scan; 04/2016 normal myocardial perfusion imaging, EF 32%.   CARDIOVERSION  07/09/2012   Procedure: CARDIOVERSION;  Surgeon: Jerel Balding, MD;  Location: [facility] ENDOSCOPY;  Service: Cardiovascular;  Laterality: N/A;   CATARACT EXTRACTION W/ INTRAOCULAR LENS IMPLANT & ANTERIOR VITRECTOMY, BILATERAL Bilateral    CIRCUMCISION N/A 03/23/2020   Procedure: CIRCUMCISION ADULT;  Surgeon: Rosalind Zachary NOVAK, MD;  Location: [facility] ORS;  Service: Urology;  Laterality: N/A;   COLONOSCOPY W/ POLYPECTOMY  approx 2006; repeated 09/2011   Polyps on 2013 EGD as well, repeat 12/2014   COLONOSCOPY WITH PROPOFOL  N/A 07/08/2021   adenoma x 1. Procedure: COLONOSCOPY WITH PROPOFOL ;  Surgeon: Rollin Dover, MD;  Location: [facility];  Service: Endoscopy;  Laterality: N/A;   ESOPHAGEAL DILATION  10/19/2023   Procedure: DILATION,  ESOPHAGUS;  Surgeon: Rollin Dover, MD;  Location: [facility];  Service: Gastroenterology;;   ESOPHAGOGASTRODUODENOSCOPY  10/18/2006   Done due to chronic GERD: Normal, bx showed no barrett's esophagus (Dr. Rollin)   EYE SURGERY  01/08/2023   FLEXOR TENDON REPAIR Left 10/02/2016   Procedure: LEFT RING FINGER WOUND EXPORATION AND FLEXOR TENDON REPAIR AND NERVE REPAIR;  Surgeon: Alm Hummer, MD;  Location: [facility] OR;  Service: Orthopedics;  Laterality: Left;   INSERT / REPLACE / REMOVE PACEMAKER  02/05/2012   dual chamber, sinus node dysfunction, sinus arrest, PAF, Medtronic Revo serial#-[id number] H: last checked 05/2015   LUMBAR LAMINECTOMY Left 1976   L4-5   PACEMAKER REMOVAL  01/04/2017   PERMANENT PACEMAKER INSERTION N/A 02/05/2012   Procedure: PERMANENT PACEMAKER INSERTION;  Surgeon: Jerel Balding, MD; Generator Medtronic [place] model [place] serial  number [id number] H Laterality: N/A;   POLYPECTOMY  07/08/2021   Procedure: POLYPECTOMY;  Surgeon: Rollin Dover, MD;  Location: [facility];  Service: Endoscopy;;   RETINAL DETACHMENT SURGERY Left ~ 1999   REVERSE SHOULDER ARTHROPLASTY Left 2018   Left shoulder reverse [facility] in [place]).   RIGHT/LEFT HEART CATH AND CORONARY ANGIOGRAPHY N/A 03/22/2018   EF 30-35%, no CAD.  Procedure: RIGHT/LEFT HEART CATH AND CORONARY ANGIOGRAPHY;  Surgeon: Cherrie Toribio SAUNDERS, MD;  Location: [facility] INVASIVE CV LAB;  Service: Cardiovascular;  Laterality: N/A;   SAVORY DILATION N/A 10/19/2023   Procedure: EGD, WITH DILATION USING SAVARY-GILLIARD DILATOR OVER GUIDEWIRE;  Surgeon: Rollin Dover, MD;  Location: [facility];  Service: Gastroenterology;  Laterality: N/A;   TRANSTHORACIC ECHOCARDIOGRAM  08/25/10; 05/2012; 03/23/16;12/2017   mild asymmetric LVH, normal systolic function, normal diastolic fxn, mild-to-mod mitral regurg, mild aortic valve sclerosis and trace AI, mild aortic root dilatation. 2014 f/u showed EF 40-45%, mod LAE, A FIB.  02/2016 EF 40%, diffuse hypokinesis, grade 2 DD. 12/2017 EF 35-40%,diffuse hypokin,grd III DD, mild MR   WISDOM TOOTH EXTRACTION  06/27/2021   EF 50-55%, mild aortic root/ascending aorta dilatation (41-42 mm).    MEDS:  Outpatient Medications Prior to Visit  Medication Sig Dispense Refill   BD INSULIN  SYRINGE U/F 31G X 5/16 1 ML MISC USE DAILY AS DIRECTED 100 each 3   chlorhexidine  (HIBICLENS ) 4 % external liquid Apply 15 mLs (1 Application total) topically as directed for 30 doses. Use as directed daily for 5 days every other week for 6 weeks. (Patient not taking: Reported on 02/28/2024) 946 mL 1   COMBIGAN  0.2-0.5 % ophthalmic solution Place 1 drop into both eyes every 12 (twelve) hours.     Continuous Blood Gluc Receiver (FREESTYLE LIBRE 2 READER) DEVI 1 each by Does not apply route daily. 1 each 3   Continuous Blood Gluc Sensor (FREESTYLE LIBRE 2 SENSOR) MISC 1  each by Does not apply route every 14 (fourteen) days. 6 each 3   diclofenac  Sodium (VOLTAREN ) 1 % GEL Apply 4 g topically 4 (four) times daily. 100 g 1   ENTRESTO  97-103 MG TAKE 1 TABLET TWO TIMES A DAY 180 tablet 3   eplerenone  (INSPRA ) 25 MG tablet TAKE 1 TABLET DAILY 90 tablet 3   gabapentin  (NEURONTIN ) 300 MG capsule TAKE 2 CAPSULES TWICE A DAY 360 capsule 3   insulin  aspart (NOVOLOG  FLEXPEN) 100 UNIT/ML FlexPen Inject 10 units before each meal 30 mL 3   insulin  glargine (LANTUS  SOLOSTAR) 100 UNIT/ML Solostar Pen Inject 30-34 Units into the skin daily.     JARDIANCE  10 MG TABS tablet TAKE 1 TABLET DAILY 90  tablet 3   latanoprost  (XALATAN ) 0.005 % ophthalmic solution Place 1 drop into both eyes at bedtime.     lidocaine  (LIDODERM ) 5 % PLACE 1 PATCH ON THE SKIN DAILY. REMOVE AND DISCARD PATCH WITHIN 12 HOURS OR AS DIRECTED BY DOCTOR 90 patch 0   meclizine  (ANTIVERT ) 25 MG tablet TAKE 1 TABLET THREE TIMES A DAY AS NEEDED FOR DIZZINESS 270 tablet 1   methocarbamol  (ROBAXIN ) 500 MG tablet Take 1 tablet (500 mg total) by mouth every 6 (six) hours as needed for muscle spasms. 40 tablet 1   metoprolol  succinate (TOPROL -XL) 50 MG 24 hr tablet TAKE 1 TABLET DAILY. TAKE WITH OR IMMEDIATELY FOLLLOWING A MEAL 90 tablet 0   mupirocin  ointment (BACTROBAN ) 2 % Place 1 Application into the nose 2 (two) times daily for 60 doses. Use as directed 2 times daily for 5 days every other week for 6 weeks. 60 g 0   omeprazole  (PRILOSEC) 40 MG capsule Take 40 mg by mouth daily.     oxybutynin  (DITROPAN -XL) 10 MG 24 hr tablet Take 1 tablet (10 mg total) by mouth at bedtime. 90 tablet 0   oxyCODONE  10 MG TABS Take 1 tablet (10 mg total) by mouth every 3 (three) hours as needed for severe pain (pain score 7-10). 30 tablet 0   oxyCODONE -acetaminophen  (PERCOCET) 10-325 MG tablet 1-2 tabs po tid prn pain (Patient not taking: Reported on 02/28/2024) 90 tablet 0   rOPINIRole  (REQUIP ) 1 MG tablet Take 1 tablet (1 mg total) by  mouth at bedtime. 90 tablet 3   rosuvastatin  (CRESTOR ) 20 MG tablet Take 20 mg by mouth at bedtime.     Semaglutide , 1 MG/DOSE, 4 MG/3ML SOPN Inject 1 mg as directed once a week. (Patient not taking: Reported on 02/28/2024) 9 mL 3   sertraline  (ZOLOFT ) 100 MG tablet TAKE 1 TABLET DAILY 90 tablet 0   SURE COMFORT PEN NEEDLES 31G X 5 MM MISC USE TO INJECT INSULIN  UNDER THE SKIN 100 each 11   XARELTO  20 MG TABS tablet TAKE 1 TABLET DAILY WITH SUPPER 90 tablet 3   No facility-administered medications prior to visit.    Physical Exam     02/27/2024    8:36 AM 02/27/2024    6:31 AM 02/26/2024   11:23 PM  Vitals with BMI  Systolic 99 94 103  Diastolic 65 60 73  Pulse 70 69 70   ***  Pertinent labs/imaging {Labs (Optional):23779}  ASSESSMENT/PLAN:  ***  {Medical decision making of moderate complexity was utilized today} 99495  {Medical decision making of high complexity was utilized today} 99496  FOLLOW UP:  ***  Signed:  Gerlene Hockey, MD           02/29/2024

## 2024-02-29 NOTE — Telephone Encounter (Signed)
 Spoke with patient, advised it may have been a scheduling error. He states it was scheduled over the phone, explained certain appointments must be in office, he decided to reschedule appt

## 2024-02-29 NOTE — Telephone Encounter (Signed)
 Reason for CRM: Patsy wife of patient is calling about recent appointment today. She states that someone called him on yesterday and told him the appointment will be a phone call. Advised that it was a in person visit that was missed and their wanting to  know why they got the call telling them different. Please contact them back to discuss.

## 2024-03-03 ENCOUNTER — Emergency Department (HOSPITAL_COMMUNITY)

## 2024-03-03 ENCOUNTER — Emergency Department (HOSPITAL_COMMUNITY)
Admission: EM | Admit: 2024-03-03 | Discharge: 2024-03-03 | Disposition: A | Discharge status: Home / Self Care | Attending: Emergency Medicine | Admitting: Emergency Medicine

## 2024-03-03 DIAGNOSIS — R202 Paresthesia of skin: Secondary | ICD-10-CM | POA: Insufficient documentation

## 2024-03-03 DIAGNOSIS — Z85828 Personal history of other malignant neoplasm of skin: Secondary | ICD-10-CM | POA: Diagnosis not present

## 2024-03-03 DIAGNOSIS — Z7901 Long term (current) use of anticoagulants: Secondary | ICD-10-CM | POA: Insufficient documentation

## 2024-03-03 DIAGNOSIS — R1032 Left lower quadrant pain: Secondary | ICD-10-CM | POA: Diagnosis not present

## 2024-03-03 DIAGNOSIS — M545 Low back pain, unspecified: Secondary | ICD-10-CM | POA: Diagnosis not present

## 2024-03-03 DIAGNOSIS — M25562 Pain in left knee: Secondary | ICD-10-CM | POA: Insufficient documentation

## 2024-03-03 DIAGNOSIS — Z87442 Personal history of urinary calculi: Secondary | ICD-10-CM | POA: Insufficient documentation

## 2024-03-03 DIAGNOSIS — Z794 Long term (current) use of insulin: Secondary | ICD-10-CM | POA: Diagnosis not present

## 2024-03-03 DIAGNOSIS — M1712 Unilateral primary osteoarthritis, left knee: Secondary | ICD-10-CM | POA: Diagnosis not present

## 2024-03-03 DIAGNOSIS — N183 Chronic kidney disease, stage 3 unspecified: Secondary | ICD-10-CM | POA: Diagnosis not present

## 2024-03-03 DIAGNOSIS — Z8616 Personal history of COVID-19: Secondary | ICD-10-CM | POA: Diagnosis not present

## 2024-03-03 DIAGNOSIS — I5022 Chronic systolic (congestive) heart failure: Secondary | ICD-10-CM | POA: Insufficient documentation

## 2024-03-03 DIAGNOSIS — I130 Hypertensive heart and chronic kidney disease with heart failure and stage 1 through stage 4 chronic kidney disease, or unspecified chronic kidney disease: Secondary | ICD-10-CM | POA: Diagnosis not present

## 2024-03-03 DIAGNOSIS — K5900 Constipation, unspecified: Secondary | ICD-10-CM | POA: Diagnosis not present

## 2024-03-03 DIAGNOSIS — K5903 Drug induced constipation: Secondary | ICD-10-CM | POA: Insufficient documentation

## 2024-03-03 DIAGNOSIS — E1122 Type 2 diabetes mellitus with diabetic chronic kidney disease: Secondary | ICD-10-CM | POA: Diagnosis not present

## 2024-03-03 DIAGNOSIS — Z79899 Other long term (current) drug therapy: Secondary | ICD-10-CM | POA: Insufficient documentation

## 2024-03-03 DIAGNOSIS — K4090 Unilateral inguinal hernia, without obstruction or gangrene, not specified as recurrent: Secondary | ICD-10-CM | POA: Diagnosis not present

## 2024-03-03 MED ORDER — POLYETHYLENE GLYCOL 3350 17 G PO PACK
17.0000 g | PACK | Freq: Every day | ORAL | Status: DC
  Administered 2024-03-03 (×2): 17 g via ORAL
  Filled 2024-03-03: qty 1

## 2024-03-03 MED ORDER — FLEET ENEMA RE ENEM
1.0000 | ENEMA | Freq: Once | RECTAL | Status: DC
  Filled 2024-03-03: qty 1

## 2024-03-03 MED ORDER — POLYETHYLENE GLYCOL 3350 17 G PO PACK: 17.0000 g | PACK | Freq: Every day | ORAL | 0 refills | Status: AC

## 2024-03-03 MED ORDER — DOCUSATE SODIUM 250 MG PO CAPS: 250.0000 mg | ORAL_CAPSULE | Freq: Every day | ORAL | 0 refills | Status: DC

## 2024-03-03 NOTE — ED Triage Notes (Signed)
 Pt BIB EMS from home c/o constipation for 8 days. Previous spinal fusion. Been taking otc stool softeners w/o relief. Afib hx. Ambulates w walker and 1 assist.   EMS vitals  BP 112/60 SPO2 94

## 2024-03-03 NOTE — Discharge Instructions (Addendum)
 I have sent your prescription for MiraLAX .  You may take 17 g/1 scoop/1 packet each day with plenty of water.  You may also take 250 mg of Colace each day.  Drink plenty of water of this and that should help you have a bowel movement.  On your knee x-ray, there was a small area on your femur did look a little bit unusual.  Most often these areas are benign, however very rarely they can require some additional testing.  We recommend that you follow-up with your primary care doctor to discuss having a CT scan done of your femur.  Your CT scan of your lumbar spine was normal.  Follow-up with your neurosurgeon.

## 2024-03-03 NOTE — ED Notes (Signed)
 Pt has been cleaned up, assisted back to bed, bedside commode emptied and repositioned in bed.

## 2024-03-03 NOTE — ED Provider Notes (Signed)
 [place] EMERGENCY DEPARTMENT AT [facility] Provider Note  CSN: [id number] Arrival date & time: 03/03/24 1554  Chief Complaint(s) Constipation  HPI Mason [name] is a 83 y.o. male who is here today with multiple complaints.  Patient reports constipation since having a spinal fusion, has not had a bowel movement in 8 days.  Has been taking a stool softener, prune juice.  Patient also Dors is a little bit of tingling in His left leg, and some pain with his left knee.  No fever, no chills.   Past Medical History Past Medical History:  Diagnosis Date   AICD (automatic cardioverter/defibrillator) present 2018   MDT CRT-D.  Fatigue-->completely pacer dependent.  Pacer settings adjusted 12/2017 to allow more chronotrophc variance with ADL's//exertion.   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   02/2016 EF 40%, diffuse hypokinesis, grade 2 DD.  Myoc perf imaging showed EF 32% 04/2016.  Pt upgraded to CRT-D 01/04/17.   Chronic renal insufficiency, stage III (moderate) (HCC) 2015   CrCl about 60 ml/min   Complete heart block (HCC)    Has dual chamber pacer.   COVID-19 virus infection 01/05/2021   paxlovid   Depression    DOE (dyspnea on exertion)    NYHA class II/III CHF   Dyspnea 2021   with exertion, bending over   Dysrhythmia    A. Fib   Episodic low back pain 01/22/2013   w/intermittent radiculitis (12/2014 his neurologist referred him to pain mgmt for epidural steroid injection)   Erectile dysfunction 2019   due to zoloft --urol rx'd viagra   GERD (gastroesophageal reflux disease)    H/O tilt table evaluation 11/02/2005   negative    Helicobacter pylori gastritis 01/2016   History of adenomatous polyp of colon 10/12/2011   Dr. Rollin (3 right side of colon- tubular adenomas removed)   History of cardiovascular stress test 05/28/2012   no ischemia, EF 37%, imaging results are unchanged and within normal variance   History of chronic prostatitis    History of kidney stones    History of vertigo    + Hx of posterior HA's.  Neuro (Dr. Maurice) eval 2011.  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   Hyperlipidemia    Hypertension    Lumbar spondylosis    lumbosacral radiculopathy at L4 by EMG testing, right foot drop (neurologist is Dr. Birder with [facility] in [place])--neurologist referred him to neurosurgery   Migraine    used to have them all the time; none for years (01/04/2017)   Myocardial infarction [facility]) ?1970s   not entirely certain of this   Nephrolithiasis 07/2012   Left UVJ 2 mm stone with dilation of renal collecting system and slight hydroureter on right   Neuropathy    NICM (nonischemic cardiomyopathy) (HCC)    a. 02/2018 Cath: LM nl, LAD min irregs, LCX no, RCA 20d. [id number]. Fick CO/CI 4.4/2.0.   Osteoarthritis, multiple sites    Shoulders, back, knees   Pacemaker 02/05/2012   dual chamber, complete heart block, meddtronic revo, lasted checked 12/2015.  Since no CAD on cath 05/2016, cards recommends  upgrade to CRT-D.   Peripheral vascular disease (HCC)    Peripheral Artery Disease   Permanent atrial fibrillation (HCC)    DCCV 07/09/13-converted, lasted two days, then back into afib--needs lifetime anticoagulation (Xarelto  as of 09/2014)   Prostate cancer screening 09/2017   done by urol annually (normal prostate exam documented + PSA 0.84 as of 10/01/17 urol f/u.  10/2018 urol f/u PSA 0.6, no prostate nodule.   Rectus diastasis    Right ankle sprain 08/2017   w/distal fibula avulsion fx noted on u/s but not plain  film-(Dr. Hudnall).   Skin cancer of arm, left    burned it off (01/04/2017)   TIA (transient ischemic attack)    L face and L arm weakness. Peri procedural->a. 03/22/2018 following cath. CT head neg. No MRI b/c has pacer. 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   Primary open angle glaucoma of both eyes, mild stage 02/14/2021   History of retinal detachment 09/23/2020   Phacodonesis 09/23/2020   Posterior vitreous detachment of right eye 09/23/2020   Pseudophakodonesis 09/23/2020   Intermediate stage nonexudative age-related macular degeneration of both eyes 09/23/2020   Gastroesophageal reflux disease 09/14/2020   Long term (current) use of anticoagulants 09/03/2018   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 2018 07/24/2018   NICM (nonischemic cardiomyopathy) (HCC) 03/23/2018   CKD (chronic kidney disease), stage III (HCC) 03/23/2018   TIA (transient ischemic attack) 03/22/2018   TIA resulting from procedure 03/22/2018   Essential hypertension 01/18/2018   CHF (congestive heart failure) (HCC) 01/04/2017   Chronic systolic heart failure (HCC) 08/04/2016   Chronic renal insufficiency, stage II (mild) 12/09/2015   Permanent atrial fibrillation (HCC) 06/22/2015   Poorly controlled type 2 diabetes mellitus with circulatory disorder (HCC) 03/25/2015   Leg fatigue 02/15/2014   Lumbar back pain with radiculopathy affecting left lower extremity 02/15/2014   Other malaise and fatigue 02/05/2014   Osteoarthritis of right knee 10/14/2013   HTN  (hypertension), benign 05/26/2013   Hyperlipidemia 04/21/2013   Atrial fibrillation, permanent 03/08/2013   Left ventricular dysfunction 05/31/2012   Complete heart block (HCC) 02/06/2012   Dyslipidemia (high LDL; low HDL) 02/06/2012   Home Medication(s) Prior to Admission medications   Medication Sig Start Date End Date Taking? Authorizing Provider  docusate sodium  (COLACE) 250 MG capsule Take 1 capsule (250 mg total) by mouth daily. 03/03/24  Yes Mannie Pac T, DO  polyethylene glycol (MIRALAX ) 17 g packet Take 17 g by mouth daily. 03/03/24  Yes Mannie Pac T, DO  BD INSULIN  SYRINGE U/F 31G X 5/16 1 ML MISC USE DAILY AS DIRECTED 01/12/23   McGowen, Aleene DEL, MD  chlorhexidine  (HIBICLENS ) 4 % external liquid Apply 15 mLs (1 Application total) topically as directed for 30 doses. Use as directed daily for 5 days every other week for 6 weeks. Patient not taking: Reported on 02/28/2024 02/25/24   Louis Shove, MD  COMBIGAN  0.2-0.5 % ophthalmic solution Place 1 drop into both eyes every 12 (twelve) hours. 04/19/16   [provider]  Continuous Blood Gluc Receiver (FREESTYLE [place]  2 READER) DEVI 1 each by Does not apply route daily. 05/31/22   Trixie File, MD  Continuous Blood Gluc Sensor (FREESTYLE LIBRE 2 SENSOR) MISC 1 each by Does not apply route every 14 (fourteen) days. 09/14/22   Trixie File, MD  diclofenac  Sodium (VOLTAREN ) 1 % GEL Apply 4 g topically 4 (four) times daily. 12/17/20   McGowen, Philip H, MD  ENTRESTO  97-103 MG TAKE 1 TABLET TWO TIMES A DAY 06/01/23   Croitoru, Mihai, MD  eplerenone  (INSPRA ) 25 MG tablet TAKE 1 TABLET DAILY 03/27/23   Croitoru, Mihai, MD  gabapentin  (NEURONTIN ) 300 MG capsule TAKE 2 CAPSULES TWICE A DAY 07/02/23   McGowen, Aleene DEL, MD  insulin  aspart (NOVOLOG  FLEXPEN) 100 UNIT/ML FlexPen Inject 10 units before each meal 03/07/23   Trixie File, MD  insulin  glargine (LANTUS  SOLOSTAR) 100 UNIT/ML Solostar Pen Inject 30-34 Units into the skin  daily. 01/29/24   Trixie File, MD  JARDIANCE  10 MG TABS tablet TAKE 1 TABLET DAILY 02/05/24   Trixie File, MD  latanoprost  (XALATAN ) 0.005 % ophthalmic solution Place 1 drop into both eyes at bedtime. 01/31/24   [provider]  lidocaine  (LIDODERM ) 5 % PLACE 1 PATCH ON THE SKIN DAILY. REMOVE AND DISCARD PATCH WITHIN 12 HOURS OR AS DIRECTED BY DOCTOR 10/19/23   McGowen, Aleene DEL, MD  meclizine  (ANTIVERT ) 25 MG tablet TAKE 1 TABLET THREE TIMES A DAY AS NEEDED FOR DIZZINESS 07/02/23   McGowen, Aleene DEL, MD  methocarbamol  (ROBAXIN ) 500 MG tablet Take 1 tablet (500 mg total) by mouth every 6 (six) hours as needed for muscle spasms. 02/27/24   Louis Shove, MD  metoprolol  succinate (TOPROL -XL) 50 MG 24 hr tablet TAKE 1 TABLET DAILY. TAKE WITH OR IMMEDIATELY FOLLLOWING A MEAL 03/12/23   McGowen, Aleene DEL, MD  mupirocin  ointment (BACTROBAN ) 2 % Place 1 Application into the nose 2 (two) times daily for 60 doses. Use as directed 2 times daily for 5 days every other week for 6 weeks. 02/25/24 03/26/24  Louis Shove, MD  omeprazole  (PRILOSEC) 40 MG capsule Take 40 mg by mouth daily.    [provider]  oxybutynin  (DITROPAN -XL) 10 MG 24 hr tablet Take 1 tablet (10 mg total) by mouth at bedtime. 02/12/24   McGowen, Aleene DEL, MD  oxyCODONE  10 MG TABS Take 1 tablet (10 mg total) by mouth every 3 (three) hours as needed for severe pain (pain score 7-10). 02/27/24   Louis Shove, MD  oxyCODONE -acetaminophen  (PERCOCET) 10-325 MG tablet 1-2 tabs po tid prn pain Patient not taking: Reported on 02/28/2024 04/06/23   McGowen, Philip H, MD  rOPINIRole  (REQUIP ) 1 MG tablet Take 1 tablet (1 mg total) by mouth at bedtime. 02/20/24   McGowen, Aleene DEL, MD  rosuvastatin  (CRESTOR ) 20 MG tablet Take 20 mg by mouth at bedtime.    [provider]  Semaglutide , 1 MG/DOSE, 4 MG/3ML SOPN Inject 1 mg as directed once a week. Patient not taking: Reported on 02/28/2024 10/11/23   Trixie File, MD  sertraline  (ZOLOFT )  100 MG tablet TAKE 1 TABLET DAILY 08/21/23   McGowen, Aleene DEL, MD  SURE COMFORT PEN NEEDLES 31G X 5 MM MISC USE TO INJECT INSULIN  UNDER THE SKIN 07/02/23   McGowen, Aleene DEL, MD  XARELTO  20 MG TABS tablet TAKE 1 TABLET DAILY WITH SUPPER 09/25/23   Croitoru, Jerel, MD  Past Surgical History Past Surgical History:  Procedure Laterality Date   ABI's Bilateral 05/21/2018   normal   ANTERIOR LATERAL LUMBAR FUSION WITH PERCUTANEOUS SCREW 2 LEVEL Left 02/25/2024   Procedure: LEFT ANTERIOR LATERAL LUMBAR FUSION WITH PERCUTANEOUS PEDICLE SCREW LUMBAR TWO-LUMBAR THREE, LUMBAR THREE-LUMBAR FOUR, POSTERIOR LATERAL AND INTERBODY FUSION;  Surgeon: Louis Shove, MD;  Location: [facility] OR;  Service: Neurosurgery;  Laterality: Left;  XLIF - left - L2-L3 - L3-L4 with perc pedicle screws - Posterior Lateral and Interbody fusion   BACK SURGERY     BIV ICD INSERTION CRT-D N/A 01/04/2017   Procedure: BiV ICD ;  Surgeon: Inocencio Soyla Lunger, MD;  Location: [facility] INVASIVE CV LAB;  Service: Cardiovascular;  Laterality: N/A;   CARDIAC CATHETERIZATION N/A 06/14/2016   Minimal nonobstructive dz, EF 25-35%.  Procedure: Left Heart Cath and Coronary Angiography;  Surgeon: Peter M Swaziland, MD;  Location: [facility] INVASIVE CV LAB;  Service: Cardiovascular;  Laterality: N/A;   CARDIOVASCULAR STRESS TEST  2012   2012 nuclear perfusion study: low risk scan; 04/2016 normal myocardial perfusion imaging, EF 32%.   CARDIOVERSION  07/09/2012   Procedure: CARDIOVERSION;  Surgeon: Jerel Balding, MD;  Location: [facility] ENDOSCOPY;  Service: Cardiovascular;  Laterality: N/A;   CATARACT EXTRACTION W/ INTRAOCULAR LENS IMPLANT & ANTERIOR VITRECTOMY, BILATERAL Bilateral    CIRCUMCISION N/A 03/23/2020   Procedure: CIRCUMCISION ADULT;  Surgeon: Rosalind Zachary NOVAK, MD;  Location: [facility] ORS;  Service: Urology;  Laterality: N/A;   COLONOSCOPY W/  POLYPECTOMY  approx 2006; repeated 09/2011   Polyps on 2013 EGD as well, repeat 12/2014   COLONOSCOPY WITH PROPOFOL  N/A 07/08/2021   adenoma x 1. Procedure: COLONOSCOPY WITH PROPOFOL ;  Surgeon: Rollin Dover, MD;  Location: [facility] ENDOSCOPY;  Service: Endoscopy;  Laterality: N/A;   ESOPHAGEAL DILATION  10/19/2023   Procedure: DILATION, ESOPHAGUS;  Surgeon: Rollin Dover, MD;  Location: [facility] ENDOSCOPY;  Service: Gastroenterology;;   ESOPHAGOGASTRODUODENOSCOPY  10/18/2006   Done due to chronic GERD: Normal, bx showed no barrett's esophagus (Dr. Rollin)   EYE SURGERY  01/08/2023   FLEXOR TENDON REPAIR Left 10/02/2016   Procedure: LEFT RING FINGER WOUND EXPORATION AND FLEXOR TENDON REPAIR AND NERVE REPAIR;  Surgeon: Alm Hummer, MD;  Location: [facility] OR;  Service: Orthopedics;  Laterality: Left;   INSERT / REPLACE / REMOVE PACEMAKER  02/05/2012   dual chamber, sinus node dysfunction, sinus arrest, PAF, Medtronic Revo serial#-[id number] H: last checked 05/2015   LUMBAR LAMINECTOMY Left 1976   L4-5   PACEMAKER REMOVAL  01/04/2017   PERMANENT PACEMAKER INSERTION N/A 02/05/2012   Procedure: PERMANENT PACEMAKER INSERTION;  Surgeon: Jerel Balding, MD; Generator Medtronic [place] model [place] serial number [id number] H Laterality: N/A;   POLYPECTOMY  07/08/2021   Procedure: POLYPECTOMY;  Surgeon: Rollin Dover, MD;  Location: [facility] ENDOSCOPY;  Service: Endoscopy;;   RETINAL DETACHMENT SURGERY Left ~ 1999   REVERSE SHOULDER ARTHROPLASTY Left 2018   Left shoulder reverse [facility] in [place]).   RIGHT/LEFT HEART CATH AND CORONARY ANGIOGRAPHY N/A 03/22/2018   EF 30-35%, no CAD.  Procedure: RIGHT/LEFT HEART CATH AND CORONARY ANGIOGRAPHY;  Surgeon: Cherrie Toribio SAUNDERS, MD;  Location: [facility] INVASIVE CV LAB;  Service: Cardiovascular;  Laterality: N/A;   SAVORY DILATION N/A 10/19/2023   Procedure: EGD, WITH DILATION USING SAVARY-GILLIARD DILATOR OVER GUIDEWIRE;  Surgeon: Rollin Dover, MD;  Location: [facility] ENDOSCOPY;   Service: Gastroenterology;  Laterality: N/A;   TRANSTHORACIC ECHOCARDIOGRAM  08/25/10; 05/2012; 03/23/16;12/2017   mild asymmetric LVH, normal systolic function, normal diastolic fxn, mild-to-mod  mitral regurg, mild aortic valve sclerosis and trace AI, mild aortic root dilatation. 2014 f/u showed EF 40-45%, mod LAE, A FIB.  02/2016 EF 40%, diffuse hypokinesis, grade 2 DD. 12/2017 EF 35-40%,diffuse hypokin,grd III DD, mild MR   WISDOM TOOTH EXTRACTION  06/27/2021   EF 50-55%, mild aortic root/ascending aorta dilatation (41-42 mm).   Family History Family History  Problem Relation Age of Onset   Heart failure Mother    Stroke Mother    Stroke Father    Heart disease Sister    Heart disease Sister    Cancer Sister        liver   Cancer Brother        lung   Cancer Brother        lung   Heart disease Brother     Social History Social History   Tobacco Use   Smoking status: Never   Smokeless tobacco: Never  Vaping Use   Vaping status: Never Used  Substance Use Topics   Alcohol use: Yes    Alcohol/week: 3.0 - 4.0 standard drinks of alcohol    Types: 2 - 3 Cans of beer, 1 Shots of liquor per week   Drug use: No   Allergies Patient has no known allergies.  Review of Systems Review of Systems  Physical Exam Vital Signs  I have reviewed the triage vital signs BP 126/70   Pulse 73   Temp 98.1 F (36.7 C) (Oral)   Resp 19   Ht 5' 11 (1.803 m)   Wt 95.3 kg   SpO2 100%   BMI 29.30 kg/m   Physical Exam Vitals and nursing note reviewed.  Constitutional:      Appearance: He is not toxic-appearing.  Eyes:     Pupils: Pupils are equal, round, and reactive to light.  Cardiovascular:     Rate and Rhythm: Normal rate.  Pulmonary:     Effort: Pulmonary effort is normal.  Abdominal:     General: Abdomen is flat. There is no distension.     Palpations: Abdomen is soft.     Tenderness: There is no abdominal tenderness.  Musculoskeletal:        General: No swelling or  deformity. Normal range of motion.     Cervical back: Normal range of motion.  Skin:    General: Skin is warm and dry.  Neurological:     Mental Status: He is alert.     Comments: Patient endorses some tingling in his left lower extremity.  He has normal plantar and dorsi flexion.     ED Results and Treatments Labs (all labs ordered are listed, but only abnormal results are displayed) Labs Reviewed - No data to display                                                                                                                        Radiology CT L-SPINE NO CHARGE Result Date: 03/03/2024 CLINICAL DATA:  Back pain and  history of prior fusion. EXAM: CT Lumbar Spine without contrast TECHNIQUE: Technique: Multiplanar CT images of the lumbar spine were reconstructed from contemporary CT of the Abdomen and Pelvis. RADIATION DOSE REDUCTION: This exam was performed according to the departmental dose-optimization program which includes automated exposure control, adjustment of the mA and/or kV according to patient size and/or use of iterative reconstruction technique. CONTRAST:  None COMPARISON:  04/27/2023 FINDINGS: Segmentation: 5 lumbar type vertebrae. Alignment: Mild retrolisthesis of L1 on L2, L2 on L3 and L3 on L4 is noted. Mild scoliosis concave to the left is noted at the thoracolumbar junction. Vertebrae: Vertebral body height is well maintained. No acute fracture or acute facet abnormality is noted. Multilevel facet hypertrophic changes and mild osteophytic changes are seen. Changes of prior fusion are seen at L2-3 and L3-4 with pedicle screw placement unilaterally on the left at all 3 levels. Paraspinal and other soft tissues: Paraspinal soft tissues demonstrate small foci of air within the psoas muscles bilaterally consistent with the recent surgery. Postoperative changes in the subcutaneous tissues posteriorly are noted as well. No focal hematoma or sizable fluid collection is noted. Disc  levels: Disc space narrowing is noted at L1-2 with osteophytic change and retrolisthesis as described. No significant central canal stenosis is noted. At L2-3 there is evidence of interval fusion with pedicle screw placement on the left. Ligamentous hypertrophy is noted. Mild osteophytic changes are seen as well. At L3-4, there is evidence of interval fusion with pedicle screw placement on the left. Facet hypertrophic changes are noted with ligamentous hypertrophy. No significant neural impingement is noted although scatter artifact somewhat limits evaluation. At L4-5 posterior osteophytes are seen. Mild associated disc bulging is noted. This in combination with facet hypertrophic changes contribute to mild canal stenosis. At L5-S1, mild generalized bulging of the disc is seen. No significant central canal stenosis is noted. No neural foraminal impingement is seen. IMPRESSION: Postsurgical and degenerative changes. No acute abnormality is identified. Electronically Signed   By: Oneil Devonshire M.D.   On: 03/03/2024 21:46   CT ABDOMEN PELVIS WO CONTRAST Result Date: 03/03/2024 CLINICAL DATA:  Bowel obstruction suspected, recent spinal fusion on 02/25/2024 EXAM: CT ABDOMEN AND PELVIS WITHOUT CONTRAST TECHNIQUE: Multidetector CT imaging of the abdomen and pelvis was performed following the standard protocol without IV contrast. RADIATION DOSE REDUCTION: This exam was performed according to the departmental dose-optimization program which includes automated exposure control, adjustment of the mA and/or kV according to patient size and/or use of iterative reconstruction technique. COMPARISON:  August 13, 2022 FINDINGS: Of note, the lack of intravenous contrast limits evaluation of the solid organ parenchyma and vascularity. Lower chest: No focal airspace consolidation or pleural effusion. Pacemaker/AICD leads in the right atrium, and right ventricle with epicardial pacing leads. Hepatobiliary: No mass.No radiopaque  stones or wall thickening of the gallbladder. No intrahepatic or extrahepatic biliary ductal dilation. Pancreas: No mass or main ductal dilation. No peripancreatic inflammation or fluid collection. Spleen: Normal size. No mass. Adrenals/Urinary Tract: No adrenal masses. No renal mass. No hydronephrosis or nephrolithiasis. The urinary bladder is distended without focal abnormality. Stomach/Bowel: The stomach is decompressed without focal abnormality. No small bowel wall thickening or inflammation. No small bowel obstruction.Normal appendix. Moderate volume fecal loading throughout the colon. Vascular/Lymphatic: No aortic aneurysm. Scattered aortoiliac atherosclerosis. No intraabdominal or pelvic lymphadenopathy. Reproductive: No prostatomegaly. No free pelvic fluid. Other: Moderate amount of retroperitoneal and extraperitoneal gas scattered along the left hemiabdomen. Gas is also noted in the small left inguinal hernia,  which contains intra-abdominal fat. Overlying subcutaneous gas is also present. No pneumoperitoneum or ascites. Musculoskeletal: No acute fracture or destructive lesion. Multilevel degenerative disc disease of the spine. For further characterization regarding the lumbar spine hardware changes, please see the separately dictated lumbar spine CT performed concurrently. IMPRESSION: 1. Moderate amount of retroperitoneal and extraperitoneal gas scattered along the left hemiabdomen. Overlying subcutaneous gas is also present. No pneumoperitoneum or ascites. These are expected findings status post the patient's recent lumbar fusion. 2. Moderate volume fecal loading throughout the colon, as can be seen in constipation. Aortic Atherosclerosis (ICD10-I70.0). Electronically Signed   By: Rogelia Myers M.D.   On: 03/03/2024 20:00   DG Knee Complete 4 Views Left Result Date: 03/03/2024 CLINICAL DATA:  Knee pain EXAM: LEFT KNEE - COMPLETE 4+ VIEW COMPARISON:  None Available. FINDINGS: No evidence of fracture,  dislocation, or joint effusion. Degenerative changes of the patellofemoral joint and mild spiking of the tibial spine. Sclerotic ill-defined lesion along the medial cortex of the distal femoral diaphysis. Soft tissues are unremarkable. IMPRESSION: Eccentric sclerotic lesion of distal femoral diaphysis may represent a benign bone island, nonossifying fibroma, sclerotic osseous metastasis or other osseous pathology including neoplastic process. Recommend CT for further assessment if clinically warranted. Mild degenerative changes of the knee joint. Electronically Signed   By: Megan  Zare M.D.   On: 03/03/2024 18:41    Pertinent labs & imaging results that were available during my care of the patient were reviewed by me and considered in my medical decision making (see MDM for details).  Medications Ordered in ED Medications  polyethylene glycol (MIRALAX  / GLYCOLAX ) packet 17 g (17 g Oral Given 03/03/24 2049)                                                                                                                                     Procedures Procedures  (including critical care time)  Medical Decision Making / ED Course   This patient presents to the ED for concern of constipation, leg tingling, knee pain., this involves an extensive number of treatment options, and is a complaint that carries with it a high risk of complications and morbidity.  The differential diagnosis includes constipation, drug-induced constipation, obstruction, postoperative pain, expected postoperative paresthesias, less likely knee fracture, less likely infection, less likely DVT.  MDM: 83 year old male here today primarily for constipation.  Will obtain CT noncontrast and image of the patient's abdomen.  Likely has to perform disimpaction or enema.  Patient with a soft abdomen.  Regarding the tingling in his legs, I anticipate this is likely related to his recent spinal fusion.  Will add on a L-spine when examining  the patient's pelvis.  Patient endorses some pain around his knee, I also suspect this is radiculopathy.  Plain films ordered.  There is no swelling, redness of the knee.  Reassessment 9:50 PM-patient without impaction, does have some constipation.  He did  have a small bowel movement here.  Will send him with disimpaction dose of MiraLAX .  CT L-spine negative.  There is a question of some osseous abnormality on his knee.  Discussed with patient at bedside.  Will have him follow-up with his PCP regarding this.  Additional history obtained: -Additional history obtained from wife and family at bedside -External records from outside source obtained and reviewed including: Chart review including previous notes, labs, imaging, consultation notes   Lab Tests: -I ordered, reviewed, and interpreted labs.   The pertinent results include:   Labs Reviewed - No data to display    Imaging Studies ordered: I ordered imaging studies including CT abdomen pelvis, CT L-spine, plain films knee I independently visualized and interpreted imaging. I agree with the radiologist interpretation   Medicines ordered and prescription drug management: Meds ordered this encounter  Medications   DISCONTD: sodium phosphate  (FLEET) enema 1 enema   polyethylene glycol (MIRALAX  / GLYCOLAX ) packet 17 g   polyethylene glycol (MIRALAX ) 17 g packet    Sig: Take 17 g by mouth daily.    Dispense:  14 each    Refill:  0   docusate sodium  (COLACE) 250 MG capsule    Sig: Take 1 capsule (250 mg total) by mouth daily.    Dispense:  10 capsule    Refill:  0    -I have reviewed the patients home medicines and have made adjustments as needed   Cardiac Monitoring: The patient was maintained on a cardiac monitor.  I personally viewed and interpreted the cardiac monitored which showed an underlying rhythm of: Normal sinus rhythm  Social Determinants of Health:  Factors impacting patients care include: Lack of access to primary  care   Reevaluation: After the interventions noted above, I reevaluated the patient and found that they have :improved  Co morbidities that complicate the patient evaluation  Past Medical History:  Diagnosis Date   AICD (automatic cardioverter/defibrillator) present 2018   MDT CRT-D.  Fatigue-->completely pacer dependent.  Pacer settings adjusted 12/2017 to allow more chronotrophc variance with ADL's//exertion.   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   02/2016 EF 40%, diffuse hypokinesis, grade 2 DD.  Myoc perf imaging showed EF 32% 04/2016.  Pt upgraded to CRT-D 01/04/17.   Chronic renal insufficiency, stage III (moderate) (HCC) 2015   CrCl about 60 ml/min   Complete heart block (HCC)    Has dual chamber pacer.   COVID-19 virus infection 01/05/2021   paxlovid   Depression    DOE (dyspnea on exertion)    NYHA class II/III CHF   Dyspnea 2021   with exertion, bending over   Dysrhythmia    A. Fib   Episodic low back pain 01/22/2013   w/intermittent radiculitis (12/2014 his neurologist referred him to pain mgmt for epidural steroid injection)   Erectile dysfunction 2019   due to zoloft --urol rx'd viagra   GERD (gastroesophageal reflux disease)    H/O tilt table evaluation 11/02/2005   negative   Helicobacter pylori gastritis 01/2016   History of adenomatous polyp of colon 10/12/2011   Dr. Rollin (3 right side of colon- tubular adenomas removed)  History of cardiovascular stress test 05/28/2012   no ischemia, EF 37%, imaging results are unchanged and within normal variance   History of chronic prostatitis    History of kidney stones    History of vertigo     + Hx of posterior HA's.  Neuro (Dr. Maurice) eval 2011.  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   Hyperlipidemia    Hypertension    Lumbar spondylosis    lumbosacral radiculopathy at L4 by EMG testing, right foot drop (neurologist is Dr. Birder with [facility] in [place])--neurologist referred him to neurosurgery   Migraine    used to have them all the time; none for years (01/04/2017)   Myocardial infarction [facility]) ?1970s   not entirely certain of this   Nephrolithiasis 07/2012   Left UVJ 2 mm stone with dilation of renal collecting system and slight hydroureter on right   Neuropathy    NICM (nonischemic cardiomyopathy) (HCC)    a. 02/2018 Cath: LM nl, LAD min irregs, LCX no, RCA 20d. ERTE86. Fick CO/CI 4.4/2.0.   Osteoarthritis, multiple sites    Shoulders, back, knees   Pacemaker 02/05/2012   dual chamber, complete heart block, meddtronic revo, lasted checked 12/2015.  Since no CAD on cath 05/2016, cards recommends upgrade to CRT-D.   Peripheral vascular disease (HCC)    Peripheral Artery Disease   Permanent atrial fibrillation (HCC)    DCCV 07/09/13-converted, lasted two days, then back into afib--needs lifetime anticoagulation (Xarelto  as of 09/2014)   Prostate cancer screening 09/2017   done by urol annually (normal prostate exam documented + PSA 0.84 as of 10/01/17 urol f/u.  10/2018 urol f/u PSA 0.6, no prostate nodule.   Rectus diastasis    Right ankle sprain 08/2017   w/distal fibula avulsion fx noted on u/s but not plain film-(Dr. Hudnall).   Skin cancer of arm, left    burned it off (01/04/2017)   TIA (transient ischemic attack)    L face and L arm weakness. Peri procedural->a. 03/22/2018 following cath. CT head neg. No MRI b/c has pacer. Likely due to embolus to distal branch of RMCA   Type II diabetes mellitus (HCC)       Dispostion: I considered admission for this patient,  however he is appropriate for discharge.     Final Clinical Impression(s) / ED Diagnoses Final diagnoses:  Drug-induced constipation     @PCDICTATION @    Mannie Fairy DASEN, DO 03/03/24 2159

## 2024-03-04 ENCOUNTER — Encounter: Payer: Self-pay | Admitting: *Deleted

## 2024-03-04 ENCOUNTER — Telehealth: Payer: Self-pay | Admitting: *Deleted

## 2024-03-05 ENCOUNTER — Ambulatory Visit: Payer: Self-pay

## 2024-03-05 ENCOUNTER — Emergency Department (HOSPITAL_COMMUNITY): Admission: EM | Admit: 2024-03-05 | Discharge: 2024-03-05 | Disposition: A | Discharge status: Home / Self Care

## 2024-03-05 ENCOUNTER — Emergency Department (HOSPITAL_COMMUNITY)

## 2024-03-05 ENCOUNTER — Other Ambulatory Visit: Payer: Self-pay

## 2024-03-05 ENCOUNTER — Encounter (HOSPITAL_COMMUNITY): Payer: Self-pay

## 2024-03-05 DIAGNOSIS — M5126 Other intervertebral disc displacement, lumbar region: Secondary | ICD-10-CM | POA: Diagnosis not present

## 2024-03-05 DIAGNOSIS — M79662 Pain in left lower leg: Secondary | ICD-10-CM | POA: Insufficient documentation

## 2024-03-05 DIAGNOSIS — Z7901 Long term (current) use of anticoagulants: Secondary | ICD-10-CM | POA: Insufficient documentation

## 2024-03-05 DIAGNOSIS — M4807 Spinal stenosis, lumbosacral region: Secondary | ICD-10-CM | POA: Diagnosis not present

## 2024-03-05 DIAGNOSIS — G8929 Other chronic pain: Secondary | ICD-10-CM | POA: Insufficient documentation

## 2024-03-05 DIAGNOSIS — R531 Weakness: Secondary | ICD-10-CM | POA: Diagnosis not present

## 2024-03-05 DIAGNOSIS — Z794 Long term (current) use of insulin: Secondary | ICD-10-CM | POA: Diagnosis not present

## 2024-03-05 DIAGNOSIS — M79661 Pain in right lower leg: Secondary | ICD-10-CM | POA: Insufficient documentation

## 2024-03-05 DIAGNOSIS — M545 Low back pain, unspecified: Secondary | ICD-10-CM | POA: Diagnosis not present

## 2024-03-05 DIAGNOSIS — M48061 Spinal stenosis, lumbar region without neurogenic claudication: Secondary | ICD-10-CM | POA: Diagnosis not present

## 2024-03-05 DIAGNOSIS — I10 Essential (primary) hypertension: Secondary | ICD-10-CM | POA: Diagnosis not present

## 2024-03-05 DIAGNOSIS — M47816 Spondylosis without myelopathy or radiculopathy, lumbar region: Secondary | ICD-10-CM | POA: Diagnosis not present

## 2024-03-05 LAB — URINALYSIS, ROUTINE W REFLEX MICROSCOPIC
Bacteria, UA: NONE SEEN
Bilirubin Urine: NEGATIVE
Glucose, UA: >=500 mg/dL — AB
Hgb urine dipstick: NEGATIVE
Ketones, ur: NEGATIVE mg/dL
Leukocytes,Ua: NEGATIVE
Nitrite: NEGATIVE
Protein, ur: NEGATIVE mg/dL
Specific Gravity, Urine: 1.012 (ref 1.005–1.030)
pH: 7 (ref 5.0–8.0)

## 2024-03-05 LAB — COMPREHENSIVE METABOLIC PANEL WITH GFR
ALT: 17 U/L (ref 0–44)
AST: 25 U/L (ref 15–41)
Albumin: 3.8 g/dL (ref 3.5–5.0)
Alkaline Phosphatase: 80 U/L (ref 38–126)
Anion gap: 13 (ref 5–15)
BUN: 37 mg/dL — ABNORMAL HIGH (ref 8–23)
CO2: 22 mmol/L (ref 22–32)
Calcium: 9.1 mg/dL (ref 8.9–10.3)
Chloride: 100 mmol/L (ref 98–111)
Creatinine, Ser: 1.71 mg/dL — ABNORMAL HIGH (ref 0.61–1.24)
GFR, Estimated: 40 mL/min — ABNORMAL LOW (ref >60)
Glucose, Bld: 155 mg/dL — ABNORMAL HIGH (ref 70–99)
Potassium: 5 mmol/L (ref 3.5–5.1)
Sodium: 135 mmol/L (ref 135–145)
Total Bilirubin: 1.8 mg/dL — ABNORMAL HIGH (ref 0.0–1.2)
Total Protein: 7.3 g/dL (ref 6.5–8.1)

## 2024-03-05 MED ORDER — KETOROLAC TROMETHAMINE 15 MG/ML IJ SOLN
15.0000 mg | Freq: Once | INTRAMUSCULAR | Status: AC
  Administered 2024-03-05 (×2): 15 mg via INTRAMUSCULAR
  Filled 2024-03-05: qty 1

## 2024-03-05 MED ORDER — METHOCARBAMOL 500 MG PO TABS
1000.0000 mg | ORAL_TABLET | Freq: Four times a day (QID) | ORAL | 0 refills | Status: DC | PRN
Start: 2024-03-05 — End: 2024-04-24

## 2024-03-05 MED ORDER — GABAPENTIN 300 MG PO CAPS
300.0000 mg | ORAL_CAPSULE | Freq: Once | ORAL | Status: AC
  Administered 2024-03-05 (×2): 300 mg via ORAL
  Filled 2024-03-05: qty 1

## 2024-03-05 MED ORDER — METHOCARBAMOL 500 MG PO TABS
500.0000 mg | ORAL_TABLET | Freq: Once | ORAL | Status: AC
  Administered 2024-03-05 (×2): 500 mg via ORAL
  Filled 2024-03-05: qty 1

## 2024-03-05 MED ORDER — OXYCODONE-ACETAMINOPHEN 5-325 MG PO TABS
1.0000 | ORAL_TABLET | Freq: Once | ORAL | Status: AC
  Administered 2024-03-05 (×2): 1 via ORAL
  Filled 2024-03-05: qty 1

## 2024-03-05 MED ORDER — DEXAMETHASONE SODIUM PHOSPHATE 10 MG/ML IJ SOLN
10.0000 mg | Freq: Once | INTRAMUSCULAR | Status: AC
  Administered 2024-03-05 (×2): 10 mg via INTRAMUSCULAR
  Filled 2024-03-05: qty 1

## 2024-03-05 MED ORDER — HYDROMORPHONE HCL 1 MG/ML IJ SOLN
1.0000 mg | Freq: Once | INTRAMUSCULAR | Status: AC
  Administered 2024-03-05 (×2): 1 mg via INTRAVENOUS
  Filled 2024-03-05: qty 1

## 2024-03-05 MED ORDER — HYDROMORPHONE HCL 1 MG/ML IJ SOLN
1.0000 mg | Freq: Once | INTRAMUSCULAR | Status: AC
  Administered 2024-03-05 (×2): 1 mg via INTRAMUSCULAR
  Filled 2024-03-05: qty 1

## 2024-03-05 MED ORDER — METHYLPREDNISOLONE 4 MG PO TBPK: ORAL_TABLET | ORAL | 0 refills | Status: DC

## 2024-03-05 NOTE — ED Triage Notes (Signed)
 Pt bib POV c/o bilateral leg pain. Pt states he had back surgery Monday. Pt states it is hard to put weight on his legs.

## 2024-03-05 NOTE — ED Provider Notes (Signed)
 I took over the patient's care at 4:30 PM pending MRI and remainder of workup.    Physical Exam  BP 121/73 (BP Location: Right Arm)   Pulse 71   Temp 98.3 F (36.8 C) (Oral)   Resp 16   Ht 5' 11 (1.803 m)   Wt 93 kg   SpO2 100%   BMI 28.59 kg/m   Physical Exam Vitals and nursing note reviewed.  Constitutional:      General: He is not in acute distress.    Appearance: Normal appearance. He is well-developed.  HENT:     Head: Normocephalic and atraumatic.  Eyes:     Conjunctiva/sclera: Conjunctivae normal.  Cardiovascular:     Rate and Rhythm: Normal rate and regular rhythm.     Heart sounds: No murmur heard. Pulmonary:     Effort: Pulmonary effort is normal. No respiratory distress.     Breath sounds: Normal breath sounds.  Abdominal:     Palpations: Abdomen is soft.     Tenderness: There is no abdominal tenderness.  Musculoskeletal:        General: No swelling.     Cervical back: Neck supple.  Skin:    General: Skin is warm and dry.     Capillary Refill: Capillary refill takes less than 2 seconds.  Neurological:     Mental Status: He is alert.  Psychiatric:        Mood and Affect: Mood normal.     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   Asst. Surgeon: Gillie, MD; Jennetta, NP  [TY]  [phone] Comprehensive metabolic panel(!) No significant metabolic derangements.  Minor elevation in his BUN and creatinine compared to 2 weeks ago. [TY]    Clinical Course User Index [TY] Neysa Caron PARAS, DO   Medical Decision  Making Patient's MRI reviewed and discussed with Dr. Darrall -neurosurgery-he reviewed the imaging and patient's previous imaging.  Recommended outpatient follow-up with patient's neurosurgeon next week.  Recommend starting patient on Medrol  Dosepak and pain control as needed.  I discussed all of this with the patient and his family members at bedside.  They are agreeable with plan.  Advised to return for new or worsening symptoms.  Will give him prescription for Robaxin  and Medrol  Dosepak and he was given multiple doses of Dilaudid  and a dose of Toradol  here.  As well as 1 dose of steroids here.  Patient and family feel comfortable to plan for patient to be discharged home  Problems Addressed: Acute on chronic low back pain: acute illness or injury  Amount and/or Complexity of Data Reviewed External Data Reviewed: notes.    Details: Prior OR notes reviewed and patient had surgery about a week ago with Dr. Malcolm Labs: ordered. Decision-making details documented in ED Course.    Details: Ordered and reviewed by me and unremarkable Radiology: ordered and independent interpretation performed. Decision-making details documented in ED Course.    Details: Reviewed by me and discussed with Dr. Smith, neurosurgery and patient has some postop changes and neuroforaminal stenosis Discussion of management or test interpretation with external provider(s): - Neurosurgery-I  spoke with him regarding the patient he recommended outpatient follow-up steroids and pain control  Risk OTC drugs. Prescription drug management. Parenteral controlled substances. Drug therapy requiring intensive monitoring for toxicity.          Gennaro Duwaine CROME, DO 03/05/24 [phone]

## 2024-03-05 NOTE — Telephone Encounter (Signed)
  FYI Only or Action Required?: FYI only for provider.  Patient was last seen in primary care on 12/28/2023 by McGowen, Aleene DEL, MD.  Called Nurse Triage reporting Knee Pain.  Symptoms began several days ago.  Interventions attempted: Prescription medications: oxycodone .  Symptoms are: rapidly worsening.  Triage Disposition: To ED  Patient/caregiver understands and will follow disposition?: YEs          Copied from CRM #[id number]. Topic: Clinical - Red Word Triage >> Mar 05, 2024  8:55 AM Dedra NOVAK wrote: [facility] that prompted transfer to Nurse Triage: Pt calling with severe pain in knees. Recently had back surgery. Patient has not slept all night. Reason for Disposition  [1] SEVERE pain (e.g., excruciating, unable to walk) AND [2] not improved after 2 hours of pain medicine  Answer Assessment - Initial Assessment Questions 1. LOCATION and RADIATION: Where is the pain located?      Bilateral knees 3. SEVERITY: How bad is the pain? What does it keep you from doing?   (Scale 1-10; or mild, moderate, severe)     Severe  4. ONSET: When did the pain start? Does it come and go, or is it there all the time?     Chronic  5. RECURRENT: Have you had this pain before? If Yes, ask: When, and what happened then?     Yes - no tx  6. SETTING: Has there been any recent work, exercise or other activity that involved that part of the body?      no  8. ASSOCIATED SYMPTOMS: Is there any swelling or redness of the knee?     no 9. OTHER SYMPTOMS: Do you have any other symptoms? (e.g., calf pain, chest pain, difficulty breathing, fever)     Hot and cold - been taking Oxycodone  and not helping  Protocols used: Knee Pain-A-AH

## 2024-03-05 NOTE — Group Note (Deleted)
 Date:  03/05/2024 Time:  2:22 PM  Group Topic/Focus:  Wellness Toolbox:   The focus of this group is to discuss various aspects of wellness, balancing those aspects and exploring ways to increase the ability to experience wellness.  Patients will create a wellness toolbox for use upon discharge.     Participation Level:  {BHH PARTICIPATION OZCZO:77735}  Participation Quality:  {BHH PARTICIPATION QUALITY:22265}  Affect:  {BHH AFFECT:22266}  Cognitive:  {BHH COGNITIVE:22267}  Insight: {BHH Insight2:20797}  Engagement in Group:  {BHH ENGAGEMENT IN HMNLE:77731}  Modes of Intervention:  {BHH MODES OF INTERVENTION:22269}  Additional Comments:  ***  [name] 03/05/2024, 2:22 PM

## 2024-03-05 NOTE — ED Provider Notes (Signed)
 [place] EMERGENCY DEPARTMENT AT [facility] Provider Note   CSN: [id number] Arrival date & time: 03/05/24  1021     Patient presents with: Leg Pain   Mason [name] is a 83 y.o. male.   This is an 83 year old male presenting emergency department for weakness and pain in bilateral legs left greater than right with difficulty ambulating.  Had spinal fusion little over a week ago.  Was seen at [facility] long couple days ago for abdominal pain constipation, discharge.  He reports that he has had progressively worsening symptoms with low back pain and pain in legs.  Endorses bladder incontinence, but seems to be related to not being able to get to the bathroom fast enough.  Notes some tingling sensation in his bilateral upper thighs.   Leg Pain      Prior to Admission medications   Medication Sig Start Date End Date Taking? Authorizing Provider  BD INSULIN  SYRINGE U/F 31G X 5/16 1 ML MISC USE DAILY AS DIRECTED 01/12/23   McGowen, Aleene DEL, MD  chlorhexidine  (HIBICLENS ) 4 % external liquid Apply 15 mLs (1 Application total) topically as directed for 30 doses. Use as directed daily for 5 days every other week for 6 weeks. Patient not taking: Reported on 02/28/2024 02/25/24   Louis Shove, MD  COMBIGAN  0.2-0.5 % ophthalmic solution Place 1 drop into both eyes every 12 (twelve) hours. 04/19/16   [provider]  Continuous Blood Gluc Receiver (FREESTYLE LIBRE 2 READER) DEVI 1 each by Does not apply route daily. 05/31/22   Trixie File, MD  Continuous Blood Gluc Sensor (FREESTYLE LIBRE 2 SENSOR) MISC 1 each by Does not apply route every 14 (fourteen) days. 09/14/22   Trixie File, MD  diclofenac  Sodium (VOLTAREN ) 1 % GEL Apply 4 g topically 4 (four) times daily. 12/17/20   McGowen, Aleene DEL, MD  docusate sodium  (COLACE) 250 MG capsule Take 1 capsule (250 mg total) by mouth daily. 03/03/24   Mannie Fairy DASEN, DO  ENTRESTO  97-103 MG TAKE 1 TABLET TWO TIMES A DAY 06/01/23    Croitoru, Mihai, MD  eplerenone  (INSPRA ) 25 MG tablet TAKE 1 TABLET DAILY 03/27/23   Croitoru, Mihai, MD  gabapentin  (NEURONTIN ) 300 MG capsule TAKE 2 CAPSULES TWICE A DAY 07/02/23   McGowen, Aleene DEL, MD  insulin  aspart (NOVOLOG  FLEXPEN) 100 UNIT/ML FlexPen Inject 10 units before each meal 03/07/23   Trixie File, MD  insulin  glargine (LANTUS  SOLOSTAR) 100 UNIT/ML Solostar Pen Inject 30-34 Units into the skin daily. 01/29/24   Trixie File, MD  JARDIANCE  10 MG TABS tablet TAKE 1 TABLET DAILY 02/05/24   Trixie File, MD  latanoprost  (XALATAN ) 0.005 % ophthalmic solution Place 1 drop into both eyes at bedtime. 01/31/24   [provider]  lidocaine  (LIDODERM ) 5 % PLACE 1 PATCH ON THE SKIN DAILY. REMOVE AND DISCARD PATCH WITHIN 12 HOURS OR AS DIRECTED BY DOCTOR 10/19/23   McGowen, Aleene DEL, MD  meclizine  (ANTIVERT ) 25 MG tablet TAKE 1 TABLET THREE TIMES A DAY AS NEEDED FOR DIZZINESS 07/02/23   McGowen, Aleene DEL, MD  methocarbamol  (ROBAXIN ) 500 MG tablet Take 1 tablet (500 mg total) by mouth every 6 (six) hours as needed for muscle spasms. 02/27/24   Louis Shove, MD  metoprolol  succinate (TOPROL -XL) 50 MG 24 hr tablet TAKE 1 TABLET DAILY. TAKE WITH OR IMMEDIATELY FOLLLOWING A MEAL 03/12/23   McGowen, Aleene DEL, MD  mupirocin  ointment (BACTROBAN ) 2 % Place 1 Application into the nose 2 (two)  times daily for 60 doses. Use as directed 2 times daily for 5 days every other week for 6 weeks. 02/25/24 03/26/24  Louis Shove, MD  omeprazole  (PRILOSEC) 40 MG capsule Take 40 mg by mouth daily.    [provider]  oxybutynin  (DITROPAN -XL) 10 MG 24 hr tablet Take 1 tablet (10 mg total) by mouth at bedtime. 02/12/24   McGowen, Aleene DEL, MD  oxyCODONE  10 MG TABS Take 1 tablet (10 mg total) by mouth every 3 (three) hours as needed for severe pain (pain score 7-10). 02/27/24   Louis Shove, MD  oxyCODONE -acetaminophen  (PERCOCET) 10-325 MG tablet 1-2 tabs po tid prn pain Patient not taking: Reported on 02/28/2024  04/06/23   McGowen, Philip H, MD  polyethylene glycol (MIRALAX ) 17 g packet Take 17 g by mouth daily. 03/03/24   Mannie Fairy DASEN, DO  rOPINIRole  (REQUIP ) 1 MG tablet Take 1 tablet (1 mg total) by mouth at bedtime. 02/20/24   McGowen, Aleene DEL, MD  rosuvastatin  (CRESTOR ) 20 MG tablet Take 20 mg by mouth at bedtime.    [provider]  Semaglutide , 1 MG/DOSE, 4 MG/3ML SOPN Inject 1 mg as directed once a week. Patient not taking: Reported on 02/28/2024 10/11/23   Trixie File, MD  sertraline  (ZOLOFT ) 100 MG tablet TAKE 1 TABLET DAILY 08/21/23   McGowen, Aleene DEL, MD  SURE COMFORT PEN NEEDLES 31G X 5 MM MISC USE TO INJECT INSULIN  UNDER THE SKIN 07/02/23   McGowen, Aleene DEL, MD  XARELTO  20 MG TABS tablet TAKE 1 TABLET DAILY WITH SUPPER 09/25/23   Croitoru, Jerel, MD    Allergies: Patient has no known allergies.    Review of Systems  Updated Vital Signs BP 103/68   Pulse 77   Temp 97.6 F (36.4 C)   Resp 16   Ht 5' 11 (1.803 m)   Wt 93 kg   SpO2 97%   BMI 28.59 kg/m   Physical Exam Vitals and nursing note reviewed.  Constitutional:      Comments: Uncomfortable appearing  HENT:     Nose: Nose normal.     Mouth/Throat:     Mouth: Mucous membranes are moist.  Eyes:     Conjunctiva/sclera: Conjunctivae normal.  Cardiovascular:     Rate and Rhythm: Normal rate and regular rhythm.     Pulses: Normal pulses.  Pulmonary:     Effort: Pulmonary effort is normal.     Breath sounds: Normal breath sounds.  Abdominal:     General: Abdomen is flat. There is no distension.     Palpations: Abdomen is soft.     Tenderness: There is no abdominal tenderness. There is no guarding or rebound.  Musculoskeletal:     Comments: Some bruising down lower back.  Incisions are clean, well-approximated with no surrounding erythema induration or fluctuance.  He has decreased strength bilateral lower extremities with slightly more pronounced weakness in the left leg versus right.  Normal sensation.   2+ pulses.  Extremities warm and well-perfused  Skin:    Capillary Refill: Capillary refill takes less than 2 seconds.  Neurological:     Mental Status: He is alert and oriented to person, place, and time.  Psychiatric:        Mood and Affect: Mood normal.        Behavior: Behavior normal.     (all labs ordered are listed, but only abnormal results are displayed) Labs Reviewed  COMPREHENSIVE METABOLIC PANEL WITH GFR - Abnormal; Notable for the  following components:      Result Value   Glucose, Bld 155 (*)    BUN 37 (*)    Creatinine, Ser 1.71 (*)    Total Bilirubin 1.8 (*)    GFR, Estimated 40 (*)    All other components within normal limits  URINALYSIS, ROUTINE W REFLEX MICROSCOPIC  CBC    EKG: EKG Interpretation Date/Time:  Wednesday March 05 2024 10:39:28 EDT Ventricular Rate:  72 PR Interval:    QRS Duration:  160 QT Interval:  470 QTC Calculation: 514 R Axis:   -89  Text Interpretation: Ventricular-paced rhythm Abnormal ECG When compared with ECG of 08-Aug-2023 09:15, PREVIOUS ECG IS PRESENT Confirmed by Neysa Clap [phone]) on 03/05/2024 1:54:42 PM  Radiology: CT L-SPINE NO CHARGE Result Date: 03/03/2024 CLINICAL DATA:  Back pain and history of prior fusion. EXAM: CT Lumbar Spine without contrast TECHNIQUE: Technique: Multiplanar CT images of the lumbar spine were reconstructed from contemporary CT of the Abdomen and Pelvis. RADIATION DOSE REDUCTION: This exam was performed according to the departmental dose-optimization program which includes automated exposure control, adjustment of the mA and/or kV according to patient size and/or use of iterative reconstruction technique. CONTRAST:  None COMPARISON:  04/27/2023 FINDINGS: Segmentation: 5 lumbar type vertebrae. Alignment: Mild retrolisthesis of L1 on L2, L2 on L3 and L3 on L4 is noted. Mild scoliosis concave to the left is noted at the thoracolumbar junction. Vertebrae: Vertebral body height is well maintained. No  acute fracture or acute facet abnormality is noted. Multilevel facet hypertrophic changes and mild osteophytic changes are seen. Changes of prior fusion are seen at L2-3 and L3-4 with pedicle screw placement unilaterally on the left at all 3 levels. Paraspinal and other soft tissues: Paraspinal soft tissues demonstrate small foci of air within the psoas muscles bilaterally consistent with the recent surgery. Postoperative changes in the subcutaneous tissues posteriorly are noted as well. No focal hematoma or sizable fluid collection is noted. Disc levels: Disc space narrowing is noted at L1-2 with osteophytic change and retrolisthesis as described. No significant central canal stenosis is noted. At L2-3 there is evidence of interval fusion with pedicle screw placement on the left. Ligamentous hypertrophy is noted. Mild osteophytic changes are seen as well. At L3-4, there is evidence of interval fusion with pedicle screw placement on the left. Facet hypertrophic changes are noted with ligamentous hypertrophy. No significant neural impingement is noted although scatter artifact somewhat limits evaluation. At L4-5 posterior osteophytes are seen. Mild associated disc bulging is noted. This in combination with facet hypertrophic changes contribute to mild canal stenosis. At L5-S1, mild generalized bulging of the disc is seen. No significant central canal stenosis is noted. No neural foraminal impingement is seen. IMPRESSION: Postsurgical and degenerative changes. No acute abnormality is identified. Electronically Signed   By: Oneil Devonshire M.D.   On: 03/03/2024 21:46   CT ABDOMEN PELVIS WO CONTRAST Result Date: 03/03/2024 CLINICAL DATA:  Bowel obstruction suspected, recent spinal fusion on 02/25/2024 EXAM: CT ABDOMEN AND PELVIS WITHOUT CONTRAST TECHNIQUE: Multidetector CT imaging of the abdomen and pelvis was performed following the standard protocol without IV contrast. RADIATION DOSE REDUCTION: This exam was  performed according to the departmental dose-optimization program which includes automated exposure control, adjustment of the mA and/or kV according to patient size and/or use of iterative reconstruction technique. COMPARISON:  August 13, 2022 FINDINGS: Of note, the lack of intravenous contrast limits evaluation of the solid organ parenchyma and vascularity. Lower chest: No focal airspace consolidation or  pleural effusion. Pacemaker/AICD leads in the right atrium, and right ventricle with epicardial pacing leads. Hepatobiliary: No mass.No radiopaque stones or wall thickening of the gallbladder. No intrahepatic or extrahepatic biliary ductal dilation. Pancreas: No mass or main ductal dilation. No peripancreatic inflammation or fluid collection. Spleen: Normal size. No mass. Adrenals/Urinary Tract: No adrenal masses. No renal mass. No hydronephrosis or nephrolithiasis. The urinary bladder is distended without focal abnormality. Stomach/Bowel: The stomach is decompressed without focal abnormality. No small bowel wall thickening or inflammation. No small bowel obstruction.Normal appendix. Moderate volume fecal loading throughout the colon. Vascular/Lymphatic: No aortic aneurysm. Scattered aortoiliac atherosclerosis. No intraabdominal or pelvic lymphadenopathy. Reproductive: No prostatomegaly. No free pelvic fluid. Other: Moderate amount of retroperitoneal and extraperitoneal gas scattered along the left hemiabdomen. Gas is also noted in the small left inguinal hernia, which contains intra-abdominal fat. Overlying subcutaneous gas is also present. No pneumoperitoneum or ascites. Musculoskeletal: No acute fracture or destructive lesion. Multilevel degenerative disc disease of the spine. For further characterization regarding the lumbar spine hardware changes, please see the separately dictated lumbar spine CT performed concurrently. IMPRESSION: 1. Moderate amount of retroperitoneal and extraperitoneal gas scattered  along the left hemiabdomen. Overlying subcutaneous gas is also present. No pneumoperitoneum or ascites. These are expected findings status post the patient's recent lumbar fusion. 2. Moderate volume fecal loading throughout the colon, as can be seen in constipation. Aortic Atherosclerosis (ICD10-I70.0). Electronically Signed   By: Rogelia Myers M.D.   On: 03/03/2024 20:00   DG Knee Complete 4 Views Left Result Date: 03/03/2024 CLINICAL DATA:  Knee pain EXAM: LEFT KNEE - COMPLETE 4+ VIEW COMPARISON:  None Available. FINDINGS: No evidence of fracture, dislocation, or joint effusion. Degenerative changes of the patellofemoral joint and mild spiking of the tibial spine. Sclerotic ill-defined lesion along the medial cortex of the distal femoral diaphysis. Soft tissues are unremarkable. IMPRESSION: Eccentric sclerotic lesion of distal femoral diaphysis may represent a benign bone island, nonossifying fibroma, sclerotic osseous metastasis or other osseous pathology including neoplastic process. Recommend CT for further assessment if clinically warranted. Mild degenerative changes of the knee joint. Electronically Signed   By: Megan  Zare M.D.   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   Asst. Surgeon: Gillie, MD; Jennetta, NP  [TY]  [phone] Comprehensive metabolic panel(!) No significant metabolic derangements.  Minor elevation in his BUN and creatinine compared to 2 weeks ago. [TY]    Clinical Course User Index [TY] Neysa Caron PARAS, DO                                 Medical Decision Making Is an 83 year old male presenting emergency department for back pain as well as leg weakness in the setting of recent spinal surgery.  He is afebrile  nontachycardic, blood pressure slightly soft.  Family members notes he is essentially unable to ambulate at this time secondary to his pain and weakness.  He does endorse some bladder incontinence, but seemingly more related to not being able to get to the bathroom quick enough.  No saddle anesthesia.  Has some global weakness in his bilateral lower extremities, however is more pronounced in the left than right.  No sensorium changes.  Per chart review it appears he was seen couple days ago Darryle Long had negative CT abdomen and reassuring lumbar CT scan.  Given progressive nature of symptoms with recent instrumentation we will get MRI to evaluate for possible cord compression versus postop complication.  Will get basic screening labs, UA.  Will give Percocet and Robaxin  for pain  Care signed out to afternoon team; dispo per MRI results.   Amount and/or Complexity of Data Reviewed Independent Historian:     Details: For member's note that his surgeon went on vacation and they have not been able to get in contact with him Labs:  Decision-making details documented in ED Course. Radiology: ordered.  Risk Prescription drug management.      Final diagnoses:  None    ED Discharge Orders     None          Neysa Caron PARAS, DO 03/05/24 1625

## 2024-03-05 NOTE — ED Provider Triage Note (Signed)
 Emergency Medicine Provider Triage Evaluation Note  Mason [name] , a 83 y.o. male  was evaluated in triage.  Pt complains of BL leg pain L>R and weakness in L leg which has been persistent since spinal fusion done by Dr. Malcolm 1 week ago. Also some abd discomfort (had CT and workup at [facility]).   No fever. Has had incontinence but seems more that he pees before able to get to bathroom because of mobility issues  Review of Systems  Positive: Abd pain, leg pain, back pain Negative: Fever   Physical Exam  BP 103/68   Pulse 77   Temp 97.6 F (36.4 C)   Resp 16   Ht 5' 11 (1.803 m)   Wt 93 kg   SpO2 97%   BMI 28.59 kg/m  Gen:   Awake, no distress   Resp:  Normal effort  MSK:   Moves extremities without difficulty  Other:  Bruising to low back and incisions that appear clean  Medical Decision Making  Medically screening exam initiated at 12:16 PM.  Appropriate orders placed.  Mason [name] was informed that the remainder of the evaluation will be completed by another provider, this initial triage assessment does not replace that evaluation, and the importance of remaining in the ED until their evaluation is complete.  [address]    Mason [name] [place], [place] 03/05/24 1217

## 2024-03-05 NOTE — Progress Notes (Signed)
  Device system confirmed to be MRI conditional, with implant date > 6 weeks ago, and no evidence of abandoned or epicardial leads in review of most recent CXR  Device last cleared by EP Provider: Daphne Barrack 03/05/24  Clearance is good through for 1 year as long as parameters remain stable at time of check. If pt undergoes a cardiac device procedure during that time, they should be re-cleared.   Tachy-therapies to be programmed off if applicable with device back to pre-MRI settings after completion of exam.  [employer] - Programming recommendation received through [employer] App/Tablet  Emogene Bouchard, RT  03/05/2024 4:41 PM

## 2024-03-05 NOTE — ED Triage Notes (Signed)
 C/O bowel and bladder loss since post surgery.

## 2024-03-05 NOTE — Discharge Instructions (Addendum)
 Call and follow-up with your neurosurgeon.  Make an appointment for next week.  Take your steroids as prescribed.  You should alternate Tylenol  and Robaxin  which is a muscle relaxer throughout the day as needed and use your oxycodone  as prescribed as needed for breakthrough pain.

## 2024-03-05 NOTE — ED Notes (Signed)
 Patient voided before UA can be obtained. Pt advised to call out next time he has the urge to urinate.

## 2024-03-07 ENCOUNTER — Telehealth: Payer: Self-pay

## 2024-03-07 NOTE — Patient Instructions (Signed)
 Visit Information  Thank you for taking time to visit with me today. Please don't hesitate to contact me if I can be of assistance to you before our next scheduled telephone appointment.  Our next appointment is by telephone on 03/14/2024 at 1030  Following is a copy of your care plan:   Goals Addressed             This Visit's Progress    [facility] Transitions of Care (TOC) Care Plan       Problems:  Recent Hospitalization for treatment of  degenerative spondylolisthesis.  Patient status post left-sided L2-3 and L3-4 retroperitoneal interbody decompression and fusion with posterior percutaneous fixation  03/07/2024 Reports severe back pain. 3 ED visits since hospital discharge.  On pain meds. Wife PA for neurosurgeon  Knowledge Deficit Related to management of status post back surgery and psychosocial needs: depression like symptoms.  Constipation:  03/07/2024  reports constipation. Had Miralax  RX on 03/03/2024, has not started yet  Goal:  Over the next 30 days, the patient will not experience hospital readmission  Interventions:  Transitions of Care: Durable Medical Equipment (DME) . Reviewed importance of ambulation with walker. Arranged PCP follow-up within 7 days (Care Guide Scheduled)  Medications reviewed and compliance discussed. Encouraged patient to take miralax  asap and daily as prescribed.  Discussed and reviewed provider advised wound care.  Reports wife is a  PA and she removed dressing.  Reviewed signs/ symptoms on infection Discussed upcoming provider visits Advised to notify provider for any new/ ongoing symptoms Advised to notify surgeon for infection like symptoms/ concerns.  Confirmed patient has surgeon follow up but he does not know date or time during call.  Confirmed patient has transportation to his provider appointments.  Reviewed elevated CBG level. Encouraged patient to call MD. Reviewed importance of taking insulin  as directed and increasing water  intake Reviewed importance of having daily BM's while on pain medications, Reviewed importance of ambulation, hydration, use of prunes, hot tea and or coffee, and taking miralax .  Encouraged patient to call MD if Bowels do not move in the next 24-48 hours.  Reviewed with patient PCP appointment cancelled for hospital follow up and has been rescheduled.  Encouraged patient to call surgeon for ongoing concerns for bowels and bladder.     Patient Self Care Activities:  Attend all scheduled provider appointments Call pharmacy for medication refills 3-7 days in advance of running out of medications Call provider office for new concerns or questions  Take medications as prescribed   Work with social worker to address your case management needs.  Follow ED discharge instructions Call surgeon for ongoing concerns for bowel and bladder activity Follow you DM diet Continue to monitor CBG and call MD for direction if CBG remains elevated Drink extra water Take Miralax  Increase fiber, consider eating prunes, drinking prune juice to help with bowels Follow instructions regarding your activity level per surgeon advisement.   Plan:  Telephone follow up appointment with care management team member scheduled for:  03/14/2024 at 1030am  with Alan Ee, RN case manager.         Patient verbalizes understanding of instructions and care plan provided today and agrees to view in MyChart. Active MyChart status and patient understanding of how to access instructions and care plan via MyChart confirmed with patient.     Telephone follow up appointment with care management team member scheduled for: 03/14/2024  Please call the care guide team at [phone] if you need to cancel  or reschedule your appointment.   Please call the Suicide and Crisis Lifeline: 988 call the [place]  National Suicide Prevention Lifeline: (226)695-9153 or TTY: 540-791-3116 TTY 8581324576) to talk to a trained counselor call  1-800-273-TALK (toll free, 24 hour hotline) call 911 if you are experiencing a Mental Health or Behavioral Health Crisis or need someone to talk to.  Alan Ee, RN, BSN, CEN [employer]- Transition of Care Team.  [facility] [phone]

## 2024-03-07 NOTE — Transitions of Care (Post Inpatient/ED Visit) (Signed)
 Transition of Care week 2  Visit Note  03/07/2024  Name: Mason [name] MRN: [id number]          DOB: 27-Jul-1940  Situation: Patient enrolled in [facility] 30-day program. Visit completed with pateint by telephone.   Background:   Initial Transition Care Management Follow-up Telephone Call    Past Medical History:  Diagnosis Date   AICD (automatic cardioverter/defibrillator) present 2018   MDT CRT-D.  Fatigue-->completely pacer dependent.  Pacer settings adjusted 12/2017 to allow more chronotrophc variance with ADL's//exertion.   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   02/2016 EF 40%, diffuse hypokinesis, grade 2 DD.  Myoc perf imaging showed EF 32% 04/2016.  Pt upgraded to CRT-D 01/04/17.   Chronic renal insufficiency, stage III (moderate) (HCC) 2015   CrCl about 60 ml/min   Complete heart block (HCC)    Has dual chamber pacer.   COVID-19 virus infection 01/05/2021   paxlovid   Depression    DOE (dyspnea on exertion)    NYHA class II/III CHF   Dyspnea 2021   with exertion, bending over   Dysrhythmia    A. Fib   Episodic low back pain 01/22/2013   w/intermittent radiculitis (12/2014 his neurologist referred him to pain mgmt for epidural steroid injection)   Erectile dysfunction 2019   due to zoloft --urol rx'd viagra   GERD (gastroesophageal reflux disease)    H/O tilt table evaluation 11/02/2005   negative   Helicobacter pylori gastritis 01/2016   History of adenomatous polyp of colon 10/12/2011   Dr. Rollin (3 right side of colon- tubular adenomas removed)   History of cardiovascular stress test 05/28/2012   no ischemia, EF 37%, imaging results are  unchanged and within normal variance   History of chronic prostatitis    History of kidney stones    History of vertigo    + Hx of posterior HA's.  Neuro (Dr. Maurice) eval 2011.  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   Hyperlipidemia    Hypertension    Lumbar spondylosis    lumbosacral radiculopathy at L4 by EMG testing, right foot drop (neurologist is Dr. Birder with [facility] in [place])--neurologist referred him to neurosurgery   Migraine    used to have them all the time; none for years (01/04/2017)   Myocardial infarction [facility]) ?1970s   not entirely certain of this   Nephrolithiasis 07/2012   Left UVJ 2 mm stone with dilation of renal collecting system and slight hydroureter on right   Neuropathy    NICM (nonischemic cardiomyopathy) (HCC)    a. 02/2018 Cath: LM nl, LAD min irregs, LCX no, RCA 20d. [id number]. Fick CO/CI 4.4/2.0.   Osteoarthritis, multiple sites    Shoulders, back, knees   Pacemaker 02/05/2012   dual chamber, complete heart block, meddtronic revo, lasted checked 12/2015.  Since no CAD on cath 05/2016, cards recommends upgrade to CRT-D.   Peripheral vascular disease (HCC)    Peripheral Artery Disease   Permanent atrial fibrillation (HCC)    DCCV 07/09/13-converted, lasted two days, then back into afib--needs lifetime anticoagulation (Xarelto  as of 09/2014)  Prostate cancer screening 09/2017   done by urol annually (normal prostate exam documented + PSA 0.84 as of 10/01/17 urol f/u.  10/2018 urol f/u PSA 0.6, no prostate nodule.   Rectus diastasis    Right ankle sprain 08/2017   w/distal fibula avulsion fx noted on u/s but not plain film-(Dr. Hudnall).   Skin cancer of arm, left    burned it off (01/04/2017)   TIA (transient ischemic attack)    L face and L arm weakness. Peri procedural->a. 03/22/2018 following cath. CT head neg. No MRI b/c has pacer. Likely due to embolus to  distal branch of RMCA   Type II diabetes mellitus (HCC)     Assessment: ED visits x3 since hospital discharge. Now with severe back pain and constipation. Elevated CBG ( on steroids)  Patient Reported Symptoms: Cognitive Cognitive Status: Able to follow simple commands, Alert and oriented to person, place, and time, Normal speech and language skills      Neurological Neurological Review of Symptoms: Other: [place] Neurological Symptoms/Conditions [RPT]: severe back pain after surgery.  problems with bowels and bladder Neurological Management Strategies: Medication therapy Neurological Comment: ED physican notified Neurosureon of findings on MRI per note  HEENT HEENT Symptoms Reported: No symptoms reported      Cardiovascular Cardiovascular Symptoms Reported: No symptoms reported Does patient have uncontrolled Hypertension?: No    Respiratory Respiratory Symptoms Reported: No symptoms reported    Endocrine Endocrine Symptoms Reported: Other Other symptoms related to hypoglycemia or hyperglycemia: constipation Is patient diabetic?: Yes Is patient checking blood sugars at home?: Yes List most recent blood sugar readings, include date and time of day: CBG today of 290  reports taking insulin  as prescribed. Newly on steroid post surgery    Gastrointestinal Gastrointestinal Symptoms Reported: Constipation Additional Gastrointestinal Details: Reports unknown when last Bm.  In the ED, was diagnosed with drug induced constipation. Gastrointestinal Management Strategies: Diet modification, Exercise, Activity, Medication therapy Gastrointestinal Self-Management Outcome: 2 (bad) Gastrointestinal Comment: Reviewed things to help with constipation, encouraged patient to take Miralax  daily as prescribed,. Reviewed importance of hydration, fiber, stool softners, prunes    Genitourinary Genitourinary Symptoms Reported: Other Other Genitourinary Symptoms: this was reviewed with patient while in the ED.   No changes at this time    Integumentary Integumentary Symptoms Reported: Incision Additional Integumentary Details: Patient reports that wife took off dressing, denies any signs of infection at this time,    Musculoskeletal Musculoskelatal Symptoms Reviewed: Difficulty walking, Back pain Additional Musculoskeletal Details: Patient reports that he is having severe back pain. Now on Gabapentin , oxycodone , robaxin . Musculoskeletal Management Strategies: Activity, Coping strategies, Medical device Musculoskeletal Comment: Encouraged patient to call MD for any additional concerns. Reviewed that getting rid of the constipation may help back pain. Encouraged patient to use walker      Psychosocial Psychosocial Symptoms Reported: Not assessed         Today's Vitals   03/07/24 1049  PainSc: 9   PainLoc: Back     Medications Reviewed Today     Reviewed by Rumalda Alan PENNER, RN (Registered Nurse) on 03/07/24 at 1029  Med List Status: <None>   Medication Order Taking? Sig Documenting Provider Last Dose Status Informant  BD INSULIN  SYRINGE U/F 31G X 5/16 1 ML MISC [id number] Yes USE DAILY AS DIRECTED McGowen, Aleene DEL, MD  Active Self, Pharmacy Records  chlorhexidine  (HIBICLENS ) 4 % external liquid [id number] Yes Apply 15 mLs (1 Application total) topically as directed for 30 doses. Use as directed daily  for 5 days every other week for 6 weeks. Louis Shove, MD  Active   COMBIGAN  0.2-0.5 % ophthalmic solution [id number] Yes Place 1 drop into both eyes every 12 (twelve) hours. [provider]  Active Self, Pharmacy Records           Med Note ALAIN, [place] A   Fri Jun 02, 2016  1:39 PM)    Continuous Blood Gluc Receiver (FREESTYLE LIBRE 2 READER) [place] [id number] Yes 1 each by Does not apply route daily. Trixie File, MD  Active Self, Pharmacy Records  Continuous Blood Gluc Sensor (FREESTYLE LIBRE 2 SENSOR) [place] [id number] Yes 1 each by Does not apply route every 14 (fourteen) days.  Trixie File, MD  Active Self, Pharmacy Records  diclofenac  Sodium (VOLTAREN ) 1 % GEL [id number] Yes Apply 4 g topically 4 (four) times daily. McGowen, Philip H, MD  Active Self, Pharmacy Records  docusate sodium  (COLACE) 250 MG capsule [id number] Yes Take 1 capsule (250 mg total) by mouth daily. Mannie Fairy DASEN, DO  Active   ENTRESTO  97-103 MG [id number] Yes TAKE 1 TABLET TWO TIMES A DAY Croitoru, Mihai, MD  Active Self, Pharmacy Records  eplerenone  (INSPRA ) 25 MG tablet [id number] Yes TAKE 1 TABLET DAILY Croitoru, Mihai, MD  Active Self, Pharmacy Records  gabapentin  (NEURONTIN ) 300 MG capsule [id number] Yes TAKE 2 CAPSULES TWICE A DAY McGowen, Aleene DEL, MD  Active Self, Pharmacy Records  insulin  aspart (NOVOLOG  FLEXPEN) 100 UNIT/ML FlexPen [id number] Yes Inject 10 units before each meal Trixie File, MD  Active Self, Pharmacy Records  insulin  glargine (LANTUS  SOLOSTAR) 100 UNIT/ML Solostar Pen [id number] Yes Inject 30-34 Units into the skin daily. Trixie File, MD  Active Self, Pharmacy Records  JARDIANCE  10 MG TABS tablet [id number] Yes TAKE 1 TABLET DAILY Trixie File, MD  Active Self, Pharmacy Records  latanoprost  (XALATAN ) 0.005 % ophthalmic solution [id number] Yes Place 1 drop into both eyes at bedtime. [provider]  Active Self, Pharmacy Records  lidocaine  (LIDODERM ) 5 % [id number] Yes PLACE 1 PATCH ON THE SKIN DAILY. REMOVE AND DISCARD PATCH WITHIN 12 HOURS OR AS DIRECTED BY DOCTOR McGowen, Aleene DEL, MD  Active Self, Pharmacy Records  meclizine  (ANTIVERT ) 25 MG tablet [id number] Yes TAKE 1 TABLET THREE TIMES A DAY AS NEEDED FOR DIZZINESS McGowen, Aleene DEL, MD  Active Self, Pharmacy Records           Med Note [place], [place] R   Tue Feb 12, 2024  1:31 PM) Takes on a scheduled basis  methocarbamol  (ROBAXIN ) 500 MG tablet [id number] Yes Take 1 tablet (500 mg total) by mouth every 6 (six) hours as needed for muscle spasms. Louis Shove, MD  Active   methocarbamol  (ROBAXIN )  500 MG tablet [id number] Yes Take 2 tablets (1,000 mg total) by mouth every 6 (six) hours as needed for muscle spasms. Kammerer, Megan L, DO  Active   methylPREDNISolone  (MEDROL  DOSEPAK) 4 MG TBPK tablet [id number] Yes Take 8 tablets (32 mg total) by mouth daily for 2 days, THEN 4 tablets (16 mg total) daily for 2 days, THEN 2 tablets (8 mg total) daily for 2 days, THEN 1 tablet (4 mg total) daily for 2 days. Kammerer, Megan L, DO  Active   metoprolol  succinate (TOPROL -XL) 50 MG 24 hr tablet [id number] Yes TAKE 1 TABLET DAILY. TAKE WITH OR IMMEDIATELY FOLLLOWING A MEAL McGowen, Aleene DEL, MD  Active Self, Pharmacy Records  mupirocin  ointment (BACTROBAN ) 2 % [id number] Yes Place 1 Application into the nose 2 (two) times  daily for 60 doses. Use as directed 2 times daily for 5 days every other week for 6 weeks. Louis Shove, MD  Active   omeprazole  (PRILOSEC) 40 MG capsule [id number] Yes Take 40 mg by mouth daily. [provider]  Active Self, Pharmacy Records  oxybutynin  (DITROPAN -XL) 10 MG 24 hr tablet [id number] Yes Take 1 tablet (10 mg total) by mouth at bedtime. Candise Aleene DEL, MD  Active Self, Pharmacy Records  oxyCODONE  10 MG TABS [id number] Yes Take 1 tablet (10 mg total) by mouth every 3 (three) hours as needed for severe pain (pain score 7-10). Louis Shove, MD  Active   oxyCODONE -acetaminophen  (PERCOCET) 10-325 MG tablet [id number] Yes 1-2 tabs po tid prn pain McGowen, Aleene DEL, MD  Active Self, Pharmacy Records  polyethylene glycol (MIRALAX ) 17 g packet [id number] Yes Take 17 g by mouth daily. Mannie Fairy DASEN, DO  Active   rOPINIRole  (REQUIP ) 1 MG tablet [id number] Yes Take 1 tablet (1 mg total) by mouth at bedtime. McGowen, Philip H, MD  Active   rosuvastatin  (CRESTOR ) 20 MG tablet [id number] Yes Take 20 mg by mouth at bedtime. [provider]  Active Self, Pharmacy Records  Semaglutide , 1 MG/DOSE, 4 MG/3ML SOPN [id number] Yes Inject 1 mg as directed once a week. Trixie File, MD   Active Self, Pharmacy Records  sertraline  (ZOLOFT ) 100 MG tablet [id number] Yes TAKE 1 TABLET DAILY McGowen, Aleene DEL, MD  Active Self, Pharmacy Records  SURE COMFORT PEN NEEDLES 31G X 5 MM MISC [id number] Yes USE TO INJECT INSULIN  UNDER THE SKIN McGowen, Aleene DEL, MD  Active Self, Pharmacy Records  XARELTO  20 MG TABS tablet [id number] Yes TAKE 1 TABLET DAILY WITH SUPPER Croitoru, Mihai, MD  Active Self, Pharmacy Records            Goals      Patient Stated     Lose weight      Patient Stated     Stay alive     [facility] Transitions of Care [facility]) Care Plan     Problems:  Recent Hospitalization for treatment of  degenerative spondylolisthesis.  Patient status post left-sided L2-3 and L3-4 retroperitoneal interbody decompression and fusion with posterior percutaneous fixation  03/07/2024 Reports severe back pain. 3 ED visits since hospital discharge.  On pain meds. Wife PA for neurosurgeon  Knowledge Deficit Related to management of status post back surgery and psychosocial needs: depression like symptoms.  Constipation:  03/07/2024  reports constipation. Had Miralax  RX on 03/03/2024, has not started yet  Goal:  Over the next 30 days, the patient will not experience hospital readmission  Interventions:  Transitions of Care: Durable Medical Equipment (DME) . Reviewed importance of ambulation with walker. Arranged PCP follow-up within 7 days (Care Guide Scheduled)  Medications reviewed and compliance discussed. Encouraged patient to take miralax  asap and daily as prescribed.  Discussed and reviewed provider advised wound care.  Reports wife is a  PA and she removed dressing.  Reviewed signs/ symptoms on infection Discussed upcoming provider visits Advised to notify provider for any new/ ongoing symptoms Advised to notify surgeon for infection like symptoms/ concerns.  Confirmed patient has surgeon follow up but he does not know date or time during call.  Confirmed patient has transportation  to his provider appointments.  Reviewed elevated CBG level. Encouraged patient to call MD. Reviewed importance of taking insulin  as directed and increasing water intake Reviewed importance of having daily BM's while on pain medications, Reviewed importance of ambulation, hydration, use  of prunes, hot tea and or coffee, and taking miralax .  Encouraged patient to call MD if Bowels do not move in the next 24-48 hours.  Reviewed with patient PCP appointment cancelled for hospital follow up and has been rescheduled.  Encouraged patient to call surgeon for ongoing concerns for bowels and bladder.     Patient Self Care Activities:  Attend all scheduled provider appointments Call pharmacy for medication refills 3-7 days in advance of running out of medications Call provider office for new concerns or questions  Take medications as prescribed   Work with social worker to address your case management needs.  Follow ED discharge instructions Call surgeon for ongoing concerns for bowel and bladder activity Follow you DM diet Continue to monitor CBG and call MD for direction if CBG remains elevated Drink extra water Take Miralax  Increase fiber, consider eating prunes, drinking prune juice to help with bowels Follow instructions regarding your activity level per surgeon advisement.   Plan:  Telephone follow up appointment with care management team member scheduled for:  03/14/2024 at 1030am  with Alan Ee, RN case manager.              This note routed to PCP  Recommendation:   Continue Current Plan of Care Take Miralax .  Follow Up Plan:   Telephone follow up appointment date/time:  03/14/2024  at 1030 am  Alan Ee, RN, BSN, [employer]- Transition of Care Team.  [facility] [phone]

## 2024-03-10 ENCOUNTER — Other Ambulatory Visit: Payer: Self-pay | Admitting: Family Medicine

## 2024-03-10 ENCOUNTER — Ambulatory Visit: Payer: Self-pay

## 2024-03-10 NOTE — Telephone Encounter (Signed)
 FYI Only or Action Required?: FYI only for provider.  Patient was last seen in primary care on 12/28/2023 by McGowen, Aleene DEL, MD.  Called Nurse Triage reporting Knee Pain.  Symptoms began several weeks ago.  Symptoms are: gradually worsening.  Triage Disposition: See PCP When Office is Open (Within 3 Days)  Patient/caregiver understands and will follow disposition?: No, wishes to speak with PCP       Copied from CRM #[id number]. Topic: Clinical - Red Word Triage >> Mar 10, 2024 11:14 AM [place] J wrote: [facility] that prompted transfer to Nurse Triage: Patient is having pain on his left knee.        Reason for Disposition  [1] MODERATE pain (e.g., interferes with normal activities, limping) AND [2] present > 3 days  Answer Assessment - Initial Assessment Questions No appointment's available with Dr. Candise prior to his upcoming hospital follow up appointment. Patient reports he will wait until his appointment and try to contact an orthopedist about his knee as well.       1. LOCATION and RADIATION: Where is the pain located?      Left knee  2. QUALITY: What does the pain feel like?  (e.g., sharp, dull, aching, burning)     I don't know how to describe it, it just hurts  3. SEVERITY: How bad is the pain? What does it keep you from doing?   (Scale 1-10; or mild, moderate, severe)     Moderate to severe  4. ONSET: When did the pain start? Does it come and go, or is it there all the time?     2 weeks ago  5. RECURRENT: Have you had this pain before? If Yes, ask: When, and what happened then?     No 6. SETTING: Has there been any recent work, exercise or other activity that involved that part of the body?      No 7. AGGRAVATING FACTORS: What makes the knee pain worse? (e.g., walking, climbing stairs, running)     Walking  8. ASSOCIATED SYMPTOMS: Is there any swelling or redness of the knee?     No 9. OTHER SYMPTOMS: Do you have any other  symptoms? (e.g., calf pain, chest pain, difficulty breathing, fever)     Constipation  Protocols used: Knee Pain-A-AH

## 2024-03-10 NOTE — Telephone Encounter (Signed)
 noted

## 2024-03-11 ENCOUNTER — Emergency Department (HOSPITAL_COMMUNITY)

## 2024-03-11 ENCOUNTER — Other Ambulatory Visit: Payer: Self-pay

## 2024-03-11 ENCOUNTER — Encounter (HOSPITAL_COMMUNITY): Payer: Self-pay

## 2024-03-11 ENCOUNTER — Emergency Department (HOSPITAL_COMMUNITY)
Admission: EM | Admit: 2024-03-11 | Discharge: 2024-03-11 | Disposition: A | Discharge status: Home / Self Care | Attending: Emergency Medicine | Admitting: Emergency Medicine

## 2024-03-11 DIAGNOSIS — I4891 Unspecified atrial fibrillation: Secondary | ICD-10-CM | POA: Diagnosis not present

## 2024-03-11 DIAGNOSIS — R109 Unspecified abdominal pain: Secondary | ICD-10-CM | POA: Diagnosis present

## 2024-03-11 DIAGNOSIS — N39498 Other specified urinary incontinence: Secondary | ICD-10-CM | POA: Insufficient documentation

## 2024-03-11 DIAGNOSIS — M25562 Pain in left knee: Secondary | ICD-10-CM | POA: Diagnosis not present

## 2024-03-11 DIAGNOSIS — G8918 Other acute postprocedural pain: Secondary | ICD-10-CM | POA: Diagnosis not present

## 2024-03-11 DIAGNOSIS — Z794 Long term (current) use of insulin: Secondary | ICD-10-CM | POA: Diagnosis not present

## 2024-03-11 DIAGNOSIS — M545 Low back pain, unspecified: Secondary | ICD-10-CM | POA: Insufficient documentation

## 2024-03-11 DIAGNOSIS — M549 Dorsalgia, unspecified: Secondary | ICD-10-CM | POA: Diagnosis not present

## 2024-03-11 DIAGNOSIS — R42 Dizziness and giddiness: Secondary | ICD-10-CM | POA: Diagnosis not present

## 2024-03-11 DIAGNOSIS — Z7901 Long term (current) use of anticoagulants: Secondary | ICD-10-CM | POA: Diagnosis not present

## 2024-03-11 DIAGNOSIS — R159 Full incontinence of feces: Secondary | ICD-10-CM | POA: Diagnosis not present

## 2024-03-11 DIAGNOSIS — R0902 Hypoxemia: Secondary | ICD-10-CM | POA: Diagnosis not present

## 2024-03-11 LAB — CBC WITH DIFFERENTIAL/PLATELET
Abs Immature Granulocytes: 0.09 K/uL — ABNORMAL HIGH (ref 0.00–0.07)
Basophils Absolute: 0 K/uL (ref 0.0–0.1)
Basophils Relative: 0 %
Eosinophils Absolute: 0 K/uL (ref 0.0–0.5)
Eosinophils Relative: 0 %
HCT: 48.3 % (ref 39.0–52.0)
Hemoglobin: 15.9 g/dL (ref 13.0–17.0)
Immature Granulocytes: 1 %
Lymphocytes Relative: 24 %
Lymphs Abs: 2.3 K/uL (ref 0.7–4.0)
MCH: 29.6 pg (ref 26.0–34.0)
MCHC: 32.9 g/dL (ref 30.0–36.0)
MCV: 89.9 fL (ref 80.0–100.0)
Monocytes Absolute: 0.6 K/uL (ref 0.1–1.0)
Monocytes Relative: 7 %
Neutro Abs: 6.6 K/uL (ref 1.7–7.7)
Neutrophils Relative %: 68 %
Platelets: 191 K/uL (ref 150–400)
RBC: 5.37 MIL/uL (ref 4.22–5.81)
RDW: 12.7 % (ref 11.5–15.5)
WBC: 9.7 K/uL (ref 4.0–10.5)
nRBC: 0 % (ref 0.0–0.2)

## 2024-03-11 LAB — COMPREHENSIVE METABOLIC PANEL WITH GFR
ALT: 20 U/L (ref 0–44)
AST: 27 U/L (ref 15–41)
Albumin: 3.3 g/dL — ABNORMAL LOW (ref 3.5–5.0)
Alkaline Phosphatase: 133 U/L — ABNORMAL HIGH (ref 38–126)
Anion gap: 10 (ref 5–15)
BUN: 37 mg/dL — ABNORMAL HIGH (ref 8–23)
CO2: 24 mmol/L (ref 22–32)
Calcium: 9.3 mg/dL (ref 8.9–10.3)
Chloride: 101 mmol/L (ref 98–111)
Creatinine, Ser: 1.48 mg/dL — ABNORMAL HIGH (ref 0.61–1.24)
GFR, Estimated: 47 mL/min — ABNORMAL LOW (ref >60)
Glucose, Bld: 165 mg/dL — ABNORMAL HIGH (ref 70–99)
Potassium: 4.6 mmol/L (ref 3.5–5.1)
Sodium: 135 mmol/L (ref 135–145)
Total Bilirubin: 0.8 mg/dL (ref 0.0–1.2)
Total Protein: 6.7 g/dL (ref 6.5–8.1)

## 2024-03-11 LAB — PROTIME-INR
INR: 1.9 — ABNORMAL HIGH (ref 0.8–1.2)
Prothrombin Time: 22.5 s — ABNORMAL HIGH (ref 11.4–15.2)

## 2024-03-11 MED ORDER — HYDROMORPHONE HCL 1 MG/ML IJ SOLN
1.0000 mg | Freq: Once | INTRAMUSCULAR | Status: AC
  Administered 2024-03-11: 1 mg via INTRAVENOUS
  Filled 2024-03-11: qty 1

## 2024-03-11 MED ORDER — PREDNISONE 20 MG PO TABS: 40.0000 mg | ORAL_TABLET | Freq: Every day | ORAL | 0 refills | Status: DC

## 2024-03-11 MED ORDER — SODIUM CHLORIDE 0.9 % IV BOLUS
500.0000 mL | Freq: Once | INTRAVENOUS | Status: AC
  Administered 2024-03-11: 500 mL via INTRAVENOUS

## 2024-03-11 MED ORDER — HYDROMORPHONE HCL 2 MG PO TABS
1.0000 mg | ORAL_TABLET | Freq: Four times a day (QID) | ORAL | 0 refills | Status: DC | PRN
Start: 2024-03-11 — End: 2024-03-18

## 2024-03-11 MED ORDER — ONDANSETRON HCL 4 MG/2ML IJ SOLN
4.0000 mg | Freq: Once | INTRAMUSCULAR | Status: AC
  Administered 2024-03-11: 4 mg via INTRAVENOUS
  Filled 2024-03-11: qty 2

## 2024-03-11 NOTE — ED Notes (Signed)
 Pt to CT

## 2024-03-11 NOTE — ED Notes (Signed)
 Neurosurgery MD at bedside.

## 2024-03-11 NOTE — ED Notes (Signed)
 Dr. Garrick at bedside conversing w/pt and pt's family.

## 2024-03-11 NOTE — ED Notes (Addendum)
 Pt stated whatever the EMS people gave me helped my pain for a little bit but now it's coming back. 200 mcg fentanyl  given en route according to EMS.

## 2024-03-11 NOTE — ED Provider Notes (Signed)
 [place] EMERGENCY DEPARTMENT AT [facility] Provider Note   CSN: [id number] Arrival date & time: 03/11/24  9443     Patient presents with: Post-op Problem   Mason [name] is a 83 y.o. male.   HPI Patient presents with family members to assist with the history.  He presents with concern for ongoing pain in his lower back, left leg.  He is 2 weeks status post lumbar spine surgery. He has had 2 prior ED visits for pain control.  He presents today due to inability to achieve comfort in spite of taking his recently prescribed pain medications. He has not yet had follow-up with a neurosurgeon.     Prior to Admission medications   Medication Sig Start Date End Date Taking? Authorizing Provider  HYDROmorphone  (DILAUDID ) 2 MG tablet Take 0.5 tablets (1 mg total) by mouth every 6 (six) hours as needed for severe pain (pain score 7-10). 03/11/24  Yes Garrick Charleston, MD  predniSONE  (DELTASONE ) 20 MG tablet Take 2 tablets (40 mg total) by mouth daily with breakfast. For the next four days 03/11/24  Yes Garrick Charleston, MD  chlorhexidine  (HIBICLENS ) 4 % external liquid Apply 15 mLs (1 Application total) topically as directed for 30 doses. Use as directed daily for 5 days every other week for 6 weeks. 02/25/24   Louis Shove, MD  COMBIGAN  0.2-0.5 % ophthalmic solution Place 1 drop into both eyes every 12 (twelve) hours. 04/19/16   [provider]  diclofenac  Sodium (VOLTAREN ) 1 % GEL Apply 4 g topically 4 (four) times daily. 12/17/20   McGowen, Aleene DEL, MD  docusate sodium  (COLACE) 250 MG capsule Take 1 capsule (250 mg total) by mouth daily. 03/03/24   Mannie Pac T, DO  ENTRESTO  97-103 MG TAKE 1 TABLET TWO TIMES A DAY 06/01/23   Croitoru, Mihai, MD  eplerenone  (INSPRA ) 25 MG tablet TAKE 1 TABLET DAILY 03/27/23   Croitoru, Mihai, MD  gabapentin  (NEURONTIN ) 300 MG capsule TAKE 2 CAPSULES TWICE A DAY 07/02/23   McGowen, Aleene DEL, MD  insulin  aspart (NOVOLOG  FLEXPEN) 100 UNIT/ML FlexPen  Inject 10 units before each meal 03/07/23   Trixie File, MD  insulin  glargine (LANTUS  SOLOSTAR) 100 UNIT/ML Solostar Pen Inject 30-34 Units into the skin daily. 01/29/24   Trixie File, MD  JARDIANCE  10 MG TABS tablet TAKE 1 TABLET DAILY 02/05/24   Trixie File, MD  latanoprost  (XALATAN ) 0.005 % ophthalmic solution Place 1 drop into both eyes at bedtime. 01/31/24   [provider]  lidocaine  (LIDODERM ) 5 % PLACE 1 PATCH ON THE SKIN DAILY. REMOVE AND DISCARD PATCH WITHIN 12 HOURS OR AS DIRECTED BY DOCTOR 10/19/23   McGowen, Aleene DEL, MD  meclizine  (ANTIVERT ) 25 MG tablet TAKE 1 TABLET THREE TIMES A DAY AS NEEDED FOR DIZZINESS 07/02/23   McGowen, Aleene DEL, MD  methocarbamol  (ROBAXIN ) 500 MG tablet Take 1 tablet (500 mg total) by mouth every 6 (six) hours as needed for muscle spasms. 02/27/24   Louis Shove, MD  methocarbamol  (ROBAXIN ) 500 MG tablet Take 2 tablets (1,000 mg total) by mouth every 6 (six) hours as needed for muscle spasms. 03/05/24   Kammerer, Megan L, DO  metoprolol  succinate (TOPROL -XL) 50 MG 24 hr tablet TAKE 1 TABLET DAILY. TAKE WITH OR IMMEDIATELY FOLLLOWING A MEAL 03/12/23   McGowen, Aleene DEL, MD  mupirocin  ointment (BACTROBAN ) 2 % Place 1 Application into the nose 2 (two) times daily for 60 doses. Use as directed 2 times daily for 5 days  every other week for 6 weeks. 02/25/24 03/26/24  Louis Shove, MD  omeprazole  (PRILOSEC) 40 MG capsule Take 40 mg by mouth daily.    [provider]  oxybutynin  (DITROPAN -XL) 10 MG 24 hr tablet Take 1 tablet (10 mg total) by mouth at bedtime. 02/12/24   McGowen, Aleene DEL, MD  polyethylene glycol (MIRALAX ) 17 g packet Take 17 g by mouth daily. 03/03/24   Mannie Fairy DASEN, DO  rOPINIRole  (REQUIP ) 1 MG tablet Take 1 tablet (1 mg total) by mouth at bedtime. 02/20/24   McGowen, Aleene DEL, MD  rosuvastatin  (CRESTOR ) 20 MG tablet Take 20 mg by mouth at bedtime.    [provider]  Semaglutide , 1 MG/DOSE, 4 MG/3ML SOPN Inject 1 mg as  directed once a week. 10/11/23   Trixie File, MD  sertraline  (ZOLOFT ) 100 MG tablet TAKE 1 TABLET DAILY 08/21/23   McGowen, Aleene DEL, MD  XARELTO  20 MG TABS tablet TAKE 1 TABLET DAILY WITH SUPPER 09/25/23   Croitoru, Jerel, MD    Allergies: Patient has no known allergies.    Review of Systems  Updated Vital Signs BP 106/70   Pulse 70   Temp 97.9 F (36.6 C) (Oral)   Resp 18   Ht 5' 11 (1.803 m)   Wt 92.1 kg   SpO2 99%   BMI 28.31 kg/m   Physical Exam Vitals and nursing note reviewed.  Constitutional:      General: He is not in acute distress.    Appearance: He is well-developed.     Comments: Elderly male awake and alert, uncomfortable appearing, seemingly having difficulty achieving a position of comfort.  HENT:     Head: Normocephalic and atraumatic.  Eyes:     Conjunctiva/sclera: Conjunctivae normal.  Cardiovascular:     Rate and Rhythm: Normal rate and regular rhythm.  Pulmonary:     Effort: Pulmonary effort is normal. No respiratory distress.     Breath sounds: No stridor.  Abdominal:     General: There is no distension.   Musculoskeletal:     Comments: Moves all extremity spontaneously including his left lower extremity.  He describes pain in the left proximal area, left knee.  Skin:    General: Skin is warm and dry.  Neurological:     Mental Status: He is alert and oriented to person, place, and time.     Cranial Nerves: No cranial nerve deficit.     Motor: No weakness, atrophy or abnormal muscle tone.     (all labs ordered are listed, but only abnormal results are displayed) Labs Reviewed  COMPREHENSIVE METABOLIC PANEL WITH GFR - Abnormal; Notable for the following components:      Result Value   Glucose, Bld 165 (*)    BUN 37 (*)    Creatinine, Ser 1.48 (*)    Albumin 3.3 (*)    Alkaline Phosphatase 133 (*)    GFR, Estimated 47 (*)    All other components within normal limits  CBC WITH DIFFERENTIAL/PLATELET - Abnormal; Notable for the following  components:   Abs Immature Granulocytes 0.09 (*)    All other components within normal limits  PROTIME-INR - Abnormal; Notable for the following components:   Prothrombin Time 22.5 (*)    INR 1.9 (*)    All other components within normal limits    EKG: None  Radiology: CT Knee Left Wo Contrast Result Date: 03/11/2024 CLINICAL DATA:  Left knee pain EXAM: CT OF THE LEFT KNEE WITHOUT CONTRAST TECHNIQUE:  Multidetector CT imaging of the left knee was performed according to the standard protocol. Multiplanar CT image reconstructions were also generated. RADIATION DOSE REDUCTION: This exam was performed according to the departmental dose-optimization program which includes automated exposure control, adjustment of the mA and/or kV according to patient size and/or use of iterative reconstruction technique. COMPARISON:  Radiograph 03/03/2024 FINDINGS: Bones/Joint/Cartilage Extensive chondrocalcinosis within along the popliteus tendon proximally. Meniscal chondrocalcinosis noted. Mild articular space narrowing in the medial compartment with small geode along the posterior tibial spine. Patellar spurring along the quadriceps and patellar tendon attachments. Mild sclerosis and localized trabecular accentuation posteromedially in the distal femoral metadiaphysis with region of involvement measuring about 3.8 by 1.3 by 0.9 cm, nonspecific although questionably associated with a tug lesion from the medial head gastrocnemius. No specific aggressive features. Similar but lesser sclerosis in the contralateral knee shown on 07/31/2016. If the patient has a history malignancy with predilection for bony spread or if otherwise clinically indicated, whole-body bone scan and/or MRI may be helpful for further characterization. Ligaments Suboptimally assessed by CT. Muscles and Tendons Chondrocalcinosis of the proximal popliteus tendon and to a lesser extent of the distal semimembranosus tendon. Soft tissues Mild atherosclerosis.  IMPRESSION: 1. 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 2. Mild articular space narrowing in the medial compartment with small geode along the posterior tibial spine. 3. Sclerotic lesion with localized trabecular accentuation posteromedially in the distal femoral metadiaphysis, nonspecific although questionably associated with a tug lesion from the medial head gastrocnemius. No specific aggressive features. Similar but lesser sclerosis in the contralateral knee shown on 07/31/2016. If the patient has a history malignancy with predilection for bony spread or if otherwise clinically indicated, whole-body bone scan and/or MRI may be helpful for further characterization. 4. Mild atherosclerosis. Electronically Signed   By: Ryan Salvage M.D.   On: 03/11/2024 09:43     Procedures   Medications Ordered in the ED  HYDROmorphone  (DILAUDID ) injection 1 mg (has no administration in time range)  sodium chloride  0.9 % bolus 500 mL (0 mLs Intravenous Stopped 03/11/24 0811)  ondansetron  (ZOFRAN ) injection 4 mg (4 mg Intravenous Given 03/11/24 0731)  HYDROmorphone  (DILAUDID ) injection 1 mg (1 mg Intravenous Given 03/11/24 0731)  HYDROmorphone  (DILAUDID ) injection 1 mg (1 mg Intravenous Given 03/11/24 1010)                                    Medical Decision Making Elderly male presents after recent lumbar spine surgery, now with concern for ongoing/worsening pain.  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 Patient's physical exam somewhat reassuring, though there is obvious stigmata of recent surgery as well as lower extremity pain. On reviewing his chart, patient had prior x-ray suggesting need for CT knee, this was executed. Soon after the my evaluation the patient I discussed his presentation with his neurosurgeon. We  discussed the patient's history, evaluation today, appropriate evaluation. Concern for postop pain versus hematoma versus knee pain. We discussed the patient's lab results, which are reasonable, no evidence for acute new neurodeficits, no indication for advanced imaging, and after the patient received several doses of analgesia here, steroids here, patient was markedly improved, resting in a prone position using his mobile telephone. Patient discharged with neurosurgery follow-up, new pain medication and anti-inflammatory  regimen.   Amount and/or Complexity of Data Reviewed Independent Historian: spouse External Data Reviewed: notes. Labs: ordered. Decision-making details documented in ED Course. Radiology: ordered and independent interpretation performed. Decision-making details documented in ED Course.  Risk Prescription drug management. Decision regarding hospitalization. Diagnosis or treatment significantly limited by social determinants of health.   12:11 PM As above, patient calm, in no distress, pain has improved markedly, case again confirmed with neurosurgery, patient discharged in stable condition.    Final diagnoses:  Post-operative pain    ED Discharge Orders          Ordered    predniSONE  (DELTASONE ) 20 MG tablet  Daily with breakfast        03/11/24 1211    HYDROmorphone  (DILAUDID ) 2 MG tablet  Every 6 hours PRN        03/11/24 1211               Garrick Charleston, MD 03/11/24 1213

## 2024-03-11 NOTE — ED Triage Notes (Addendum)
 Pt to ED via [facility] from home. Pt c/o left side pain including flank to knee. Pt had spinal surgery 2 weeks ago and has had unmanageable pain since then. Pt has taken oxycodone  and gabapentin  with a muscle relaxer. Pt ambulatory w/walker at home. Pt received fentanyl  200mcg in route.   Pt c/o pain at surgical site, mid and left back. Pt has intermittent tingling in right arm and tingling and burning in left upper leg since surgery. Pt has had bowel and bladder incontinence since surgery which is new for him. Pt has not had fevers, has had nausea and chills.   EMS VS 110/70 90bpm 99% RA  Cbg WNL

## 2024-03-13 ENCOUNTER — Other Ambulatory Visit (HOSPITAL_COMMUNITY): Payer: Self-pay | Admitting: Family Medicine

## 2024-03-13 DIAGNOSIS — M25562 Pain in left knee: Secondary | ICD-10-CM

## 2024-03-14 ENCOUNTER — Other Ambulatory Visit: Payer: Self-pay

## 2024-03-14 ENCOUNTER — Telehealth: Payer: Self-pay

## 2024-03-14 DIAGNOSIS — M5416 Radiculopathy, lumbar region: Secondary | ICD-10-CM

## 2024-03-14 NOTE — Patient Instructions (Signed)
 Visit Information  Thank you for taking time to visit with me today. Please don't hesitate to contact me if I can be of assistance to you before our next scheduled telephone appointment.  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  Patient status post left-sided L2-3 and L3-4 retroperitoneal interbody decompression and fusion with posterior percutaneous fixation  03/07/2024 Reports severe back pain. 3 ED visits since hospital discharge.  On pain meds. Wife PA for neurosurgeon 03/14/2024 patient report severe left knee, back and left leg pain.  Patient reports that he saw Dr. Ernie ( now states that he is not sure who he saw) yesterday about his knee. Now states wife is not a PA at neurosurgeon.  Reports that he has 2 granddaughter that are PA's.   Patient reports that he was in the ED this week and Dr. Malcolm came to see him in the ED.  Patient pending follow up with Dr. Malcolm next week.   Knowledge Deficit Related to management of status post back surgery and psychosocial needs: depression like symptoms.  Constipation:  03/07/2024  reports constipation. Had Miralax  RX on 03/03/2024, has not started yet. 03/14/2024  Reports bowels are moving every other day.  Reports uncontrolled pain- 03/14/2024 states that he will run out of pain medication today. Not taking medications correctly. Should have prednisone  left and does not. Not taking Robaxin  as prescribed.   Goal:  Over the next 30 days, the patient will not experience hospital readmission  Interventions:  Transitions of Care: Durable Medical Equipment (DME) . Reviewed importance of ambulation with walker. Reviewed previous scheduled hospital follow up that was cancelled due to ED visit.  Rescheduled for  03/18/2024  Medications reviewed and compliance discussed.  Advised to notify provider for any new/ ongoing symptoms Confirmed patient has transportation to his provider appointments.  Reviewed elevated CBG level. Encouraged patient to call MD. Reviewed importance of taking insulin  as directed and increasing water intake Reviewed importance of having daily BM's while on pain medications, Reviewed importance of ambulation, hydration, use of prunes, hot tea and or coffee, and taking miralax .  Reviewed that patient is out of prednisone , not taking Robaxin  as prescribed.  Encouraged patient to call MD offices and pharmacy about his pain medication refill. Reviewed with patient the importance of notifying his MD's about elevated CBG and changes in his condition. Reviewed concern with patient that he is struggling with medication management. Reports that his wife is helping him.  Referral placed for pharmacy.     Patient Self Care Activities:  Attend all scheduled provider appointments Call pharmacy for medication refills 3-7 days in advance of running out of medications Call provider office for new concerns or questions  Take medications as prescribed   Work with social worker to address your case management needs.  Follow ED discharge instructions Follow you DM diet Continue to monitor CBG and call MD for direction if CBG remains elevated Drink extra water Increase fiber, consider eating prunes, drinking prune juice to help with bowels Follow instructions regarding your activity level per surgeon advisement.  Call pharmacy and MD offices about your pain medications.  Plan:  Telephone follow up appointment with  care management team member scheduled for:  03/19/2024 at  1030 with Alan Ee, RN case manager.         Patient verbalizes understanding of instructions and care plan provided today and agrees to view in MyChart. Active MyChart status and patient understanding of how to access  instructions and care plan via MyChart confirmed with patient.     Telephone follow up appointment with care management team member scheduled for:  Please call the care guide team at [phone] if you need to cancel or reschedule your appointment.   Please call the Suicide and Crisis Lifeline: 988 call the [place]  National Suicide Prevention Lifeline: (424)317-0158 or TTY: 562-585-4260 TTY 513 296 6663) to talk to a trained counselor call 1-800-273-TALK (toll free, 24 hour hotline) call 911 if you are experiencing a Mental Health or Behavioral Health Crisis or need someone to talk to.  Alan Ee, RN, BSN, CEN [employer]- Transition of Care Team.  [facility] [phone]

## 2024-03-14 NOTE — Transitions of Care (Post Inpatient/ED Visit) (Signed)
 Transition of Care week 3  Visit Note  03/14/2024  Name: Mason [name] MRN: [id number]          DOB: [birth year]  Situation: Patient enrolled in [facility] 30-day program. Visit completed with patient by telephone.   Background:   Initial Transition Care Management Follow-up Telephone Call    Past Medical History:  Diagnosis Date   AICD (automatic cardioverter/defibrillator) present 2018   MDT CRT-D.  Fatigue-->completely pacer dependent.  Pacer settings adjusted 12/2017 to allow more chronotrophc variance with ADL's//exertion.   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   02/2016 EF 40%, diffuse hypokinesis, grade 2 DD.  Myoc perf imaging showed EF 32% 04/2016.  Pt upgraded to CRT-D 01/04/17.   Chronic renal insufficiency, stage III (moderate) (HCC) 2015   CrCl about 60 ml/min   Complete heart block (HCC)    Has dual chamber pacer.   COVID-19 virus infection 01/05/2021   paxlovid   Depression    DOE (dyspnea on exertion)    NYHA class II/III CHF   Dyspnea 2021   with exertion, bending over   Dysrhythmia    A. Fib   Episodic low back pain 01/22/2013   w/intermittent radiculitis (12/2014 his neurologist referred him to pain mgmt for epidural steroid injection)   Erectile dysfunction 2019   due to zoloft --urol rx'd viagra   GERD (gastroesophageal reflux disease)    H/O tilt table evaluation 11/02/2005   negative   Helicobacter pylori gastritis 01/2016   History of adenomatous polyp of colon 10/12/2011   Dr. Rollin (3 right side of colon- tubular adenomas removed)   History of cardiovascular stress test 05/28/2012   no ischemia, EF 37%, imaging results are  unchanged and within normal variance   History of chronic prostatitis    History of kidney stones    History of vertigo    + Hx of posterior HA's.  Neuro (Dr. Maurice) eval 2011.  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   Hyperlipidemia    Hypertension    Lumbar spondylosis    lumbosacral radiculopathy at L4 by EMG testing, right foot drop (neurologist is Dr. Birder with [facility] in [place])--neurologist referred him to neurosurgery   Migraine    used to have them all the time; none for years (01/04/2017)   Myocardial infarction [facility]) ?1970s   not entirely certain of this   Nephrolithiasis 07/2012   Left UVJ 2 mm stone with dilation of renal collecting system and slight hydroureter on right   Neuropathy    NICM (nonischemic cardiomyopathy) (HCC)    a. 02/2018 Cath: LM nl, LAD min irregs, LCX no, RCA 20d. [id number]. Fick CO/CI 4.4/2.0.   Osteoarthritis, multiple sites    Shoulders, back, knees   Pacemaker 02/05/2012   dual chamber, complete heart block, meddtronic revo, lasted checked 12/2015.  Since no CAD on cath 05/2016, cards recommends upgrade to CRT-D.   Peripheral vascular disease (HCC)    Peripheral Artery Disease   Permanent atrial fibrillation (HCC)    DCCV 07/09/13-converted, lasted two days, then back into afib--needs lifetime anticoagulation (Xarelto  as of 09/2014)  Prostate cancer screening 09/2017   done by urol annually (normal prostate exam documented + PSA 0.84 as of 10/01/17 urol f/u.  10/2018 urol f/u PSA 0.6, no prostate nodule.   Rectus diastasis    Right ankle sprain 08/2017   w/distal fibula avulsion fx noted on u/s but not plain film-(Dr. Hudnall).   Skin cancer of arm, left    burned it off (01/04/2017)   TIA (transient ischemic attack)    L face and L arm weakness. Peri procedural->a. 03/22/2018 following cath. CT head neg. No MRI b/c has pacer. Likely due to embolus to  distal branch of RMCA   Type II diabetes mellitus (HCC)     Assessment: Recent ED visit this week. Continues to complain of uncontrolled pain.  Reports high CBG - unable to provide reading today.  Reports saw ortho yesterday and does not know name.  Missed initial follow up with PCP due to being in the ED.  Patient Reported Symptoms: Cognitive Cognitive Status: Able to follow simple commands, Alert and oriented to person, place, and time, Normal speech and language skills      Neurological Neurological Review of Symptoms: Other: [place] Neurological Symptoms/Conditions [RPT]: severe back pain. has been to the ED this week for pain control Neurological Management Strategies: Medication therapy, Medical device Neurological Comment: continues to use walker.  Appears that ED MD spoke with Dr. Malcolm. Patient reports that Dr. Malcolm came to see him in the ED.  HEENT HEENT Symptoms Reported: No symptoms reported      Cardiovascular Cardiovascular Symptoms Reported: No symptoms reported    Respiratory Respiratory Symptoms Reported: No symptoms reported    Endocrine Endocrine Symptoms Reported: Other Other symptoms related to hypoglycemia or hyperglycemia: reports CBG  bad..  Range of 250-400 in the last week.    Gastrointestinal Gastrointestinal Symptoms Reported: Other Other Gastrointestinal Symptoms: Reports bowels are moving every other day now      Genitourinary Genitourinary Symptoms Reported: No symptoms reported    Integumentary Integumentary Symptoms Reported: Other Other Integumentary Symptoms: reports incision is healed    Musculoskeletal Musculoskelatal Symptoms Reviewed: Back pain, Difficulty walking, Joint pain Additional Musculoskeletal Details: Reports severe back pain, leg pain and knee pain. Saw ortho yesterday. Musculoskeletal Management Strategies: Medication therapy, Medical device      Psychosocial Psychosocial Symptoms Reported: Not assessed         Today's Vitals    03/14/24 1102  PainSc: 10-Worst pain ever     Medications Reviewed Today     Reviewed by Rumalda Alan PENNER, RN (Registered Nurse) on 03/14/24 at 1117  Med List Status: <None>   Medication Order Taking? Sig Documenting Provider Last Dose Status Informant  chlorhexidine  (HIBICLENS ) 4 % external liquid [id number] Yes Apply 15 mLs (1 Application total) topically as directed for 30 doses. Use as directed daily for 5 days every other week for 6 weeks. Louis Shove, MD  Active   COMBIGAN  0.2-0.5 % ophthalmic solution [id number] Yes Place 1 drop into both eyes every 12 (twelve) hours. [provider]  Active Self, Pharmacy Records           Med Note ALAIN, [place] A   Fri Jun 02, 2016  1:39 PM)    diclofenac  Sodium (VOLTAREN ) 1 % GEL [id number] Yes Apply 4 g topically 4 (four) times daily. Candise Aleene DEL, MD  Active Self, Pharmacy Records  docusate sodium  (COLACE) 250 MG capsule [id number] Yes Take 1 capsule (250 mg total) by mouth daily. Mannie Fairy DASEN, DO  Active   ENTRESTO  97-103 MG [id number] Yes TAKE 1 TABLET TWO TIMES A DAY Croitoru, Mihai, MD  Active Self, Pharmacy Records  eplerenone  (INSPRA ) 25 MG tablet [id number] Yes TAKE 1 TABLET DAILY Croitoru, Mihai, MD  Active Self, Pharmacy Records  gabapentin  (NEURONTIN ) 300 MG capsule [id number] Yes TAKE 2 CAPSULES TWICE A DAY McGowen, Aleene DEL, MD  Active Self, Pharmacy Records  HYDROmorphone  (DILAUDID ) 2 MG tablet [id number] Yes Take 0.5 tablets (1 mg total) by mouth every 6 (six) hours as needed for severe pain (pain score 7-10). Garrick Charleston, MD  Active   insulin  aspart (NOVOLOG  FLEXPEN) 100 UNIT/ML FlexPen [id number] Yes Inject 10 units before each meal Trixie File, MD  Active Self, Pharmacy Records  insulin  glargine (LANTUS  SOLOSTAR) 100 UNIT/ML Solostar Pen [id number] Yes Inject 30-34 Units into the skin daily. Trixie File, MD  Active Self, Pharmacy Records  JARDIANCE  10 MG TABS tablet [id number] Yes TAKE 1 TABLET DAILY  Trixie File, MD  Active Self, Pharmacy Records  latanoprost  (XALATAN ) 0.005 % ophthalmic solution [id number] Yes Place 1 drop into both eyes at bedtime. [provider]  Active Self, Pharmacy Records  lidocaine  (LIDODERM ) 5 % [id number] Yes PLACE 1 PATCH ON THE SKIN DAILY. REMOVE AND DISCARD PATCH WITHIN 12 HOURS OR AS DIRECTED BY DOCTOR McGowen, Aleene DEL, MD  Active Self, Pharmacy Records  meclizine  (ANTIVERT ) 25 MG tablet [id number] Yes TAKE 1 TABLET THREE TIMES A DAY AS NEEDED FOR DIZZINESS McGowen, Aleene DEL, MD  Active Self, Pharmacy Records           Med Note [place], [place] R   Tue Feb 12, 2024  1:31 PM) Takes on a scheduled basis  methocarbamol  (ROBAXIN ) 500 MG tablet [id number] Yes Take 1 tablet (500 mg total) by mouth every 6 (six) hours as needed for muscle spasms. Louis Shove, MD  Active   methocarbamol  (ROBAXIN ) 500 MG tablet [id number] Yes Take 2 tablets (1,000 mg total) by mouth every 6 (six) hours as needed for muscle spasms. Kammerer, Megan L, DO  Active   metoprolol  succinate (TOPROL -XL) 50 MG 24 hr tablet [id number] Yes TAKE 1 TABLET DAILY. TAKE WITH OR IMMEDIATELY FOLLLOWING A MEAL McGowen, Aleene DEL, MD  Active Self, Pharmacy Records  mupirocin  ointment (BACTROBAN ) 2 % [id number] Yes Place 1 Application into the nose 2 (two) times daily for 60 doses. Use as directed 2 times daily for 5 days every other week for 6 weeks. Louis Shove, MD  Active   omeprazole  (PRILOSEC) 40 MG capsule [id number] Yes Take 40 mg by mouth daily. [provider]  Active Self, Pharmacy Records  oxybutynin  (DITROPAN -XL) 10 MG 24 hr tablet [id number] Yes Take 1 tablet (10 mg total) by mouth at bedtime. McGowen, Philip H, MD  Active Self, Pharmacy Records  polyethylene glycol (MIRALAX ) 17 g packet [id number] Yes Take 17 g by mouth daily. Mannie Fairy DASEN, DO  Active   predniSONE  (DELTASONE ) 20 MG tablet [id number]  Take 2 tablets (40 mg total) by mouth daily with breakfast. For the next four days   Patient not taking: Reported on 03/14/2024   Garrick Charleston, MD  Active   rOPINIRole  (REQUIP ) 1 MG tablet [id number] Yes Take 1 tablet (1 mg total) by mouth at bedtime. McGowen, Philip H, MD  Active   rosuvastatin  (CRESTOR ) 20 MG tablet [id number] Yes Take 20 mg by mouth at bedtime. [provider]  Active Self, Pharmacy Records  Semaglutide , 1 MG/DOSE, 4 MG/3ML SOPN [id number]  Inject 1 mg as directed once  a week.  Patient not taking: Reported on 03/14/2024   Trixie File, MD  Active Self, Pharmacy Records  sertraline  (ZOLOFT ) 100 MG tablet [id number] Yes TAKE 1 TABLET DAILY McGowen, Aleene DEL, MD  Active Self, Pharmacy Records  XARELTO  20 MG TABS tablet [id number] Yes TAKE 1 TABLET DAILY WITH SUPPER Croitoru, Mihai, MD  Active Self, Pharmacy Records            Goals                        [facility] Transitions of Care [facility]) Care Plan     Problems:  Recent Hospitalization for treatment of  degenerative spondylolisthesis.  Patient status post left-sided L2-3 and L3-4 retroperitoneal interbody decompression and fusion with posterior percutaneous fixation  03/07/2024 Reports severe back pain. 3 ED visits since hospital discharge.  On pain meds. Wife PA for neurosurgeon 03/14/2024 patient report severe left knee, back and left leg pain.  Patient reports that he saw Dr. Ernie ( now states that he is not sure who he saw) yesterday about his knee. Now states wife is not a PA at neurosurgeon.  Reports that he has 2 granddaughter that are PA's.   Patient reports that he was in the ED this week and Dr. Malcolm came to see him in the ED.  Patient pending follow up with Dr. Malcolm next week.   Knowledge Deficit Related to management of status post back surgery and psychosocial needs: depression like symptoms.  Constipation:  03/07/2024  reports constipation. Had Miralax  RX on 03/03/2024, has not started yet. 03/14/2024  Reports bowels are moving every other day.  Reports uncontrolled pain- 03/14/2024  states that he will run out of pain medication today. Not taking medications correctly. Should have prednisone  left and does not. Not taking Robaxin  as prescribed.   Goal:  Over the next 30 days, the patient will not experience hospital readmission  Interventions:  Transitions of Care: Durable Medical Equipment (DME) . Reviewed importance of ambulation with walker. Reviewed previous scheduled hospital follow up that was cancelled due to ED visit.  Rescheduled for 03/18/2024  Medications reviewed and compliance discussed.  Advised to notify provider for any new/ ongoing symptoms Confirmed patient has transportation to his provider appointments.  Reviewed elevated CBG level. Encouraged patient to call MD. Reviewed importance of taking insulin  as directed and increasing water intake Reviewed importance of having daily BM's while on pain medications, Reviewed importance of ambulation, hydration, use of prunes, hot tea and or coffee, and taking miralax .  Reviewed that patient is out of prednisone , not taking Robaxin  as prescribed.  Encouraged patient to call MD offices and pharmacy about his pain medication refill. Reviewed with patient the importance of notifying his MD's about elevated CBG and changes in his condition. Reviewed concern with patient that he is struggling with medication management. Reports that his wife is helping him.  Referral placed for pharmacy.     Patient Self Care Activities:  Attend all scheduled provider appointments Call pharmacy for medication refills 3-7 days in advance of running out of medications Call provider office for new concerns or questions  Take medications as prescribed   Work with social worker to address your case management needs.  Follow ED discharge instructions Follow you DM diet Continue to monitor CBG and call MD for direction if CBG remains elevated Drink extra water Increase fiber, consider eating prunes, drinking prune juice to help with  bowels Follow instructions regarding your activity level  per surgeon advisement.  Call pharmacy and MD offices about your pain medications.  Plan:  Telephone follow up appointment with care management team member scheduled for:  03/19/2024 at  1030 with Alan Ee, RN case manager.                  Recommendation:   Continue Current Plan of Care Call pharmacy about pain medication  Follow Up Plan:   Telephone follow up appointment date/time:  03/19/2024   Alan Ee, RN, BSN, CEN Population Health- Transition of Care Team.  [facility] [phone]

## 2024-03-14 NOTE — Progress Notes (Signed)
 Care Guide Pharmacy Note  03/14/2024 Name: Mason [name] MRN: [id number] DOB: 1941/01/03  Referred By: Candise Aleene DEL, MD Reason for referral: Complex Care Management (Outreach to schedule with Pharm d )   Mason [name] is a 83 y.o. year old male who is a primary care patient of McGowen, Aleene DEL, MD.  Mason [name] was referred to the pharmacist for assistance related to: Atrial Fibrillation  Successful contact was made with the patient to discuss pharmacy services including being ready for the pharmacist to call at least 5 minutes before the scheduled appointment time and to have medication bottles and any blood pressure readings ready for review. The patient agreed to meet with the pharmacist via telephone visit on (date/time).03/20/2024  Jeoffrey Buffalo , RMA     [place]  [facility], [facility] Guide  Direct Dial: [phone]  Website: [email or web address]

## 2024-03-16 ENCOUNTER — Inpatient Hospital Stay (HOSPITAL_COMMUNITY)
Admission: EM | Admit: 2024-03-16 | Discharge: 2024-03-18 | DRG: 554 | Disposition: A | Discharge status: Home Health | Attending: Internal Medicine | Admitting: Internal Medicine

## 2024-03-16 ENCOUNTER — Emergency Department (HOSPITAL_COMMUNITY)

## 2024-03-16 ENCOUNTER — Encounter (HOSPITAL_COMMUNITY): Payer: Self-pay | Admitting: Emergency Medicine

## 2024-03-16 DIAGNOSIS — Z85828 Personal history of other malignant neoplasm of skin: Secondary | ICD-10-CM | POA: Diagnosis not present

## 2024-03-16 DIAGNOSIS — K219 Gastro-esophageal reflux disease without esophagitis: Secondary | ICD-10-CM | POA: Diagnosis present

## 2024-03-16 DIAGNOSIS — Z95810 Presence of automatic (implantable) cardiac defibrillator: Secondary | ICD-10-CM

## 2024-03-16 DIAGNOSIS — Z8249 Family history of ischemic heart disease and other diseases of the circulatory system: Secondary | ICD-10-CM | POA: Diagnosis not present

## 2024-03-16 DIAGNOSIS — I5042 Chronic combined systolic (congestive) and diastolic (congestive) heart failure: Secondary | ICD-10-CM | POA: Diagnosis present

## 2024-03-16 DIAGNOSIS — M11262 Other chondrocalcinosis, left knee: Principal | ICD-10-CM | POA: Diagnosis present

## 2024-03-16 DIAGNOSIS — I10 Essential (primary) hypertension: Secondary | ICD-10-CM | POA: Diagnosis present

## 2024-03-16 DIAGNOSIS — M25562 Pain in left knee: Secondary | ICD-10-CM | POA: Diagnosis not present

## 2024-03-16 DIAGNOSIS — F32A Depression, unspecified: Secondary | ICD-10-CM | POA: Diagnosis present

## 2024-03-16 DIAGNOSIS — E1151 Type 2 diabetes mellitus with diabetic peripheral angiopathy without gangrene: Secondary | ICD-10-CM | POA: Diagnosis present

## 2024-03-16 DIAGNOSIS — M545 Low back pain, unspecified: Secondary | ICD-10-CM | POA: Diagnosis not present

## 2024-03-16 DIAGNOSIS — Z9841 Cataract extraction status, right eye: Secondary | ICD-10-CM

## 2024-03-16 DIAGNOSIS — R109 Unspecified abdominal pain: Secondary | ICD-10-CM | POA: Diagnosis not present

## 2024-03-16 DIAGNOSIS — N3289 Other specified disorders of bladder: Secondary | ICD-10-CM | POA: Diagnosis not present

## 2024-03-16 DIAGNOSIS — N401 Enlarged prostate with lower urinary tract symptoms: Secondary | ICD-10-CM | POA: Diagnosis present

## 2024-03-16 DIAGNOSIS — G893 Neoplasm related pain (acute) (chronic): Secondary | ICD-10-CM | POA: Diagnosis present

## 2024-03-16 DIAGNOSIS — N1831 Chronic kidney disease, stage 3a: Secondary | ICD-10-CM | POA: Diagnosis not present

## 2024-03-16 DIAGNOSIS — N400 Enlarged prostate without lower urinary tract symptoms: Secondary | ICD-10-CM | POA: Diagnosis present

## 2024-03-16 DIAGNOSIS — Z8673 Personal history of transient ischemic attack (TIA), and cerebral infarction without residual deficits: Secondary | ICD-10-CM

## 2024-03-16 DIAGNOSIS — Z7984 Long term (current) use of oral hypoglycemic drugs: Secondary | ICD-10-CM

## 2024-03-16 DIAGNOSIS — I4821 Permanent atrial fibrillation: Secondary | ICD-10-CM | POA: Diagnosis present

## 2024-03-16 DIAGNOSIS — M109 Gout, unspecified: Secondary | ICD-10-CM | POA: Diagnosis present

## 2024-03-16 DIAGNOSIS — E785 Hyperlipidemia, unspecified: Secondary | ICD-10-CM | POA: Diagnosis present

## 2024-03-16 DIAGNOSIS — Z981 Arthrodesis status: Secondary | ICD-10-CM | POA: Diagnosis not present

## 2024-03-16 DIAGNOSIS — I130 Hypertensive heart and chronic kidney disease with heart failure and stage 1 through stage 4 chronic kidney disease, or unspecified chronic kidney disease: Secondary | ICD-10-CM | POA: Diagnosis present

## 2024-03-16 DIAGNOSIS — I4891 Unspecified atrial fibrillation: Secondary | ICD-10-CM | POA: Diagnosis present

## 2024-03-16 DIAGNOSIS — I509 Heart failure, unspecified: Secondary | ICD-10-CM

## 2024-03-16 DIAGNOSIS — Z7901 Long term (current) use of anticoagulants: Secondary | ICD-10-CM

## 2024-03-16 DIAGNOSIS — E1122 Type 2 diabetes mellitus with diabetic chronic kidney disease: Secondary | ICD-10-CM | POA: Diagnosis present

## 2024-03-16 DIAGNOSIS — I252 Old myocardial infarction: Secondary | ICD-10-CM

## 2024-03-16 DIAGNOSIS — Z961 Presence of intraocular lens: Secondary | ICD-10-CM | POA: Diagnosis present

## 2024-03-16 DIAGNOSIS — I495 Sick sinus syndrome: Secondary | ICD-10-CM | POA: Diagnosis present

## 2024-03-16 DIAGNOSIS — R52 Pain, unspecified: Secondary | ICD-10-CM | POA: Diagnosis present

## 2024-03-16 DIAGNOSIS — Z823 Family history of stroke: Secondary | ICD-10-CM | POA: Diagnosis not present

## 2024-03-16 DIAGNOSIS — N183 Chronic kidney disease, stage 3 unspecified: Secondary | ICD-10-CM | POA: Diagnosis present

## 2024-03-16 DIAGNOSIS — Z794 Long term (current) use of insulin: Secondary | ICD-10-CM | POA: Diagnosis not present

## 2024-03-16 DIAGNOSIS — Z9842 Cataract extraction status, left eye: Secondary | ICD-10-CM

## 2024-03-16 DIAGNOSIS — I5022 Chronic systolic (congestive) heart failure: Secondary | ICD-10-CM | POA: Diagnosis not present

## 2024-03-16 DIAGNOSIS — I442 Atrioventricular block, complete: Secondary | ICD-10-CM | POA: Diagnosis not present

## 2024-03-16 DIAGNOSIS — E119 Type 2 diabetes mellitus without complications: Secondary | ICD-10-CM

## 2024-03-16 DIAGNOSIS — E1165 Type 2 diabetes mellitus with hyperglycemia: Secondary | ICD-10-CM | POA: Diagnosis not present

## 2024-03-16 DIAGNOSIS — Z79899 Other long term (current) drug therapy: Secondary | ICD-10-CM

## 2024-03-16 DIAGNOSIS — M25569 Pain in unspecified knee: Secondary | ICD-10-CM | POA: Diagnosis present

## 2024-03-16 DIAGNOSIS — Z96612 Presence of left artificial shoulder joint: Secondary | ICD-10-CM | POA: Diagnosis present

## 2024-03-16 DIAGNOSIS — N3946 Mixed incontinence: Secondary | ICD-10-CM | POA: Diagnosis present

## 2024-03-16 DIAGNOSIS — Z8616 Personal history of COVID-19: Secondary | ICD-10-CM

## 2024-03-16 LAB — GLUCOSE, CAPILLARY
Glucose-Capillary: 129 mg/dL — ABNORMAL HIGH (ref 70–99)
Glucose-Capillary: 148 mg/dL — ABNORMAL HIGH (ref 70–99)

## 2024-03-16 LAB — CBC
HCT: 48.1 % (ref 39.0–52.0)
Hemoglobin: 16.2 g/dL (ref 13.0–17.0)
MCH: 30.1 pg (ref 26.0–34.0)
MCHC: 33.7 g/dL (ref 30.0–36.0)
MCV: 89.2 fL (ref 80.0–100.0)
Platelets: 190 K/uL (ref 150–400)
RBC: 5.39 MIL/uL (ref 4.22–5.81)
RDW: 12.5 % (ref 11.5–15.5)
WBC: 8.4 K/uL (ref 4.0–10.5)
nRBC: 0 % (ref 0.0–0.2)

## 2024-03-16 LAB — COMPREHENSIVE METABOLIC PANEL WITH GFR
ALT: 30 U/L (ref 0–44)
AST: 23 U/L (ref 15–41)
Albumin: 3.4 g/dL — ABNORMAL LOW (ref 3.5–5.0)
Alkaline Phosphatase: 166 U/L — ABNORMAL HIGH (ref 38–126)
Anion gap: 9 (ref 5–15)
BUN: 23 mg/dL (ref 8–23)
CO2: 25 mmol/L (ref 22–32)
Calcium: 9.1 mg/dL (ref 8.9–10.3)
Chloride: 102 mmol/L (ref 98–111)
Creatinine, Ser: 1.38 mg/dL — ABNORMAL HIGH (ref 0.61–1.24)
GFR, Estimated: 51 mL/min — ABNORMAL LOW (ref >60)
Glucose, Bld: 141 mg/dL — ABNORMAL HIGH (ref 70–99)
Potassium: 3.9 mmol/L (ref 3.5–5.1)
Sodium: 136 mmol/L (ref 135–145)
Total Bilirubin: 0.9 mg/dL (ref 0.0–1.2)
Total Protein: 6.8 g/dL (ref 6.5–8.1)

## 2024-03-16 LAB — URIC ACID: Uric Acid, Serum: 5.9 mg/dL (ref 3.7–8.6)

## 2024-03-16 LAB — C-REACTIVE PROTEIN: CRP: <0.5 mg/dL (ref <1.0)

## 2024-03-16 LAB — SYNOVIAL FLUID, CRYSTAL: Crystals, Fluid: NONE SEEN

## 2024-03-16 MED ORDER — ONDANSETRON HCL 4 MG PO TABS
4.0000 mg | ORAL_TABLET | Freq: Four times a day (QID) | ORAL | Status: DC | PRN
  Filled 2024-03-16: qty 1

## 2024-03-16 MED ORDER — OXYCODONE-ACETAMINOPHEN 7.5-325 MG PO TABS
1.0000 | ORAL_TABLET | Freq: Four times a day (QID) | ORAL | Status: DC | PRN
  Filled 2024-03-16: qty 1

## 2024-03-16 MED ORDER — SERTRALINE HCL 100 MG PO TABS
100.0000 mg | ORAL_TABLET | Freq: Every day | ORAL | Status: DC
  Administered 2024-03-17 – 2024-03-18 (×2): 100 mg via ORAL
  Filled 2024-03-16 (×2): qty 1

## 2024-03-16 MED ORDER — ROSUVASTATIN CALCIUM 20 MG PO TABS
20.0000 mg | ORAL_TABLET | Freq: Every day | ORAL | Status: DC
  Administered 2024-03-16 – 2024-03-17 (×2): 20 mg via ORAL
  Filled 2024-03-16 (×2): qty 1

## 2024-03-16 MED ORDER — OXYCODONE-ACETAMINOPHEN 7.5-325 MG PO TABS: 1.0000 | ORAL_TABLET | Freq: Four times a day (QID) | ORAL | Status: DC | PRN

## 2024-03-16 MED ORDER — INSULIN GLARGINE-YFGN 100 UNIT/ML ~~LOC~~ SOLN: 25.0000 [IU] | Freq: Every day | SUBCUTANEOUS | Status: DC

## 2024-03-16 MED ORDER — HYDROMORPHONE HCL 1 MG/ML IJ SOLN
1.0000 mg | Freq: Once | INTRAMUSCULAR | Status: AC
  Administered 2024-03-16: 1 mg via INTRAVENOUS
  Filled 2024-03-16: qty 1

## 2024-03-16 MED ORDER — INSULIN ASPART 100 UNIT/ML IJ SOLN
0.0000 [IU] | Freq: Three times a day (TID) | INTRAMUSCULAR | Status: DC
  Administered 2024-03-16 – 2024-03-17 (×2): 1 [IU] via SUBCUTANEOUS
  Administered 2024-03-17: 2 [IU] via SUBCUTANEOUS
  Administered 2024-03-17: 3 [IU] via SUBCUTANEOUS
  Administered 2024-03-18 (×2): 2 [IU] via SUBCUTANEOUS

## 2024-03-16 MED ORDER — HYDROMORPHONE HCL 1 MG/ML IJ SOLN: 2.0000 mg | INTRAMUSCULAR | Status: DC | PRN

## 2024-03-16 MED ORDER — HYDROMORPHONE HCL 1 MG/ML IJ SOLN
1.0000 mg | Freq: Once | INTRAMUSCULAR | Status: AC | PRN
  Administered 2024-03-16: 1 mg via INTRAVENOUS
  Filled 2024-03-16: qty 1

## 2024-03-16 MED ORDER — HYDROMORPHONE HCL 1 MG/ML IJ SOLN
1.0000 mg | INTRAMUSCULAR | Status: DC | PRN
  Administered 2024-03-16: 1 mg via INTRAVENOUS
  Filled 2024-03-16: qty 1

## 2024-03-16 MED ORDER — ONDANSETRON HCL 4 MG/2ML IJ SOLN: 4.0000 mg | Freq: Four times a day (QID) | INTRAMUSCULAR | Status: DC | PRN

## 2024-03-16 MED ORDER — HYDROMORPHONE HCL 1 MG/ML IJ SOLN
0.5000 mg | INTRAMUSCULAR | Status: DC | PRN
  Administered 2024-03-16: 0.5 mg via INTRAVENOUS
  Filled 2024-03-16: qty 0.5

## 2024-03-16 MED ORDER — OXYCODONE-ACETAMINOPHEN 5-325 MG PO TABS
1.0000 | ORAL_TABLET | Freq: Four times a day (QID) | ORAL | Status: DC | PRN
  Administered 2024-03-16 – 2024-03-18 (×3): 1 via ORAL
  Filled 2024-03-16 (×3): qty 1

## 2024-03-16 MED ORDER — RIVAROXABAN 10 MG PO TABS
20.0000 mg | ORAL_TABLET | Freq: Every day | ORAL | Status: DC
Start: 2024-03-16 — End: 2024-03-17
  Administered 2024-03-16: 20 mg via ORAL
  Filled 2024-03-16 (×2): qty 2

## 2024-03-16 MED ORDER — HYDROMORPHONE HCL 1 MG/ML IJ SOLN
2.0000 mg | INTRAMUSCULAR | Status: DC | PRN
  Administered 2024-03-16 – 2024-03-18 (×6): 2 mg via INTRAVENOUS
  Filled 2024-03-16 (×6): qty 2

## 2024-03-16 MED ORDER — INSULIN GLARGINE 100 UNIT/ML ~~LOC~~ SOLN
25.0000 [IU] | Freq: Every day | SUBCUTANEOUS | Status: DC
  Administered 2024-03-16 – 2024-03-18 (×3): 25 [IU] via SUBCUTANEOUS
  Filled 2024-03-16 (×3): qty 0.25

## 2024-03-16 MED ORDER — ENOXAPARIN SODIUM 40 MG/0.4ML IJ SOSY
40.0000 mg | PREFILLED_SYRINGE | INTRAMUSCULAR | Status: DC
Start: 2024-03-16 — End: 2024-03-16
  Administered 2024-03-16: 40 mg via SUBCUTANEOUS
  Filled 2024-03-16: qty 0.4

## 2024-03-16 MED ORDER — IOHEXOL 350 MG/ML SOLN
75.0000 mL | Freq: Once | INTRAVENOUS | Status: AC | PRN
  Administered 2024-03-16: 75 mL via INTRAVENOUS

## 2024-03-16 MED ORDER — OXYCODONE HCL 5 MG PO TABS: 2.5000 mg | ORAL_TABLET | Freq: Four times a day (QID) | ORAL | Status: DC | PRN

## 2024-03-16 NOTE — Assessment & Plan Note (Signed)
 Continue statin

## 2024-03-16 NOTE — Assessment & Plan Note (Addendum)
 Creatinine 1.38 with GFR in the 40s Looks to be near baseline Monitor

## 2024-03-16 NOTE — Consult Note (Signed)
 Providing Compassionate, Quality Care - Together   Reason for Consult: Recent spinal surgery Referring Physician: Dr. Neysa Nancyann JONELLE [name] is an 83 y.o. male.  HPI: Mr. Mason [name] is an 83 year old male with a past medical history outlined below. He underwent an L2-3, L3-4 XLIF by Dr. Louis on 02/25/2024.  Unfortunately, Mr. Mason [name] has had issues managing his pain at home and has presented to the emergency department on four separate occasions. His first ED visit on 03/03/2024 was due to severe constipation.  He was started on MiraLax  and advised to avoid opioid medication.  With the muscle relaxer and acetaminophen , his pain was not well-controlled.  He ended up returning to the emergency department on 03/05/2024.  He received IV Dilaudid  and Toradol .  Imaging was performed with no acute findings.  He was started on a Medrol  Dosepak and discharged home. He was seen in our office on 03/06/2024. A better bowel regimen to address his constipation was addressed. Pain medication and another Medrol  Dosepak were sent to the patient's pharmacy. He returned the emergency department on 03/11/2024 with uncontrolled pain. He received three doses of 1 mg IV Dilaudid  while in the ED. There were no concerning findings on his exam. He was prescribed prednisone  and PO hydromorphone  at discharge.  During this exam, the patient's daughter is at the bedside. She reports the patient continues to have severe, constant knee pain. She reports Mr. Mason [name] had an abnormal CT scan of his left knee and is supposed to be getting an MRI as an outpatient. He is having less of the pain radiating into his anterior thigh that he previously reported and now describes pain that seems to be isolated to the left knee. His prescribed pain medication has not been helping. Neurosurgery was consulted for further evaluation and recommendations given Mr. Mason [name]'s recent lumbar surgery.  Past Medical History:  Diagnosis Date   AICD (automatic  cardioverter/defibrillator) present 2018   MDT CRT-D.  Fatigue-->completely pacer dependent.  Pacer settings adjusted 12/2017 to allow more chronotrophc variance with ADL's//exertion.   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   02/2016 EF 40%, diffuse hypokinesis, grade 2 DD.  Myoc perf imaging showed EF 32% 04/2016.  Pt upgraded to CRT-D 01/04/17.   Chronic renal insufficiency, stage III (moderate) (HCC) 2015   CrCl about 60 ml/min   Complete heart block (HCC)    Has dual chamber pacer.   COVID-19 virus infection 01/05/2021   paxlovid   Depression    DOE (dyspnea on exertion)    NYHA class II/III CHF   Dyspnea 2021   with exertion, bending over   Dysrhythmia    A. Fib   Episodic low back pain 01/22/2013   w/intermittent radiculitis (12/2014 his neurologist referred him to pain mgmt for epidural steroid injection)   Erectile dysfunction 2019   due to zoloft --urol rx'd viagra   GERD (gastroesophageal reflux disease)    H/O tilt table evaluation 11/02/2005   negative   Helicobacter pylori gastritis 01/2016   History of adenomatous polyp of colon 10/12/2011   Dr. Rollin (3 right side of colon- tubular adenomas removed)   History of cardiovascular  stress test 05/28/2012   no ischemia, EF 37%, imaging results are unchanged and within normal variance   History of chronic prostatitis    History of kidney stones    History of vertigo    + Hx of posterior HA's.  Neuro (Dr. Maurice) eval 2011.  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   Hyperlipidemia    Hypertension    Lumbar spondylosis     lumbosacral radiculopathy at L4 by EMG testing, right foot drop (neurologist is Dr. Birder with [facility] in [place])--neurologist referred him to neurosurgery   Migraine    used to have them all the time; none for years (01/04/2017)   Myocardial infarction [facility]) ?1970s   not entirely certain of this   Nephrolithiasis 07/2012   Left UVJ 2 mm stone with dilation of renal collecting system and slight hydroureter on right   Neuropathy    NICM (nonischemic cardiomyopathy) (HCC)    a. 02/2018 Cath: LM nl, LAD min irregs, LCX no, RCA 20d. [id number]. Fick CO/CI 4.4/2.0.   Osteoarthritis, multiple sites    Shoulders, back, knees   Pacemaker 02/05/2012   dual chamber, complete heart block, meddtronic revo, lasted checked 12/2015.  Since no CAD on cath 05/2016, cards recommends upgrade to CRT-D.   Peripheral vascular disease (HCC)    Peripheral Artery Disease   Permanent atrial fibrillation (HCC)    DCCV 07/09/13-converted, lasted two days, then back into afib--needs lifetime anticoagulation (Xarelto  as of 09/2014)   Prostate cancer screening 09/2017   done by urol annually (normal prostate exam documented + PSA 0.84 as of 10/01/17 urol f/u.  10/2018 urol f/u PSA 0.6, no prostate nodule.   Rectus diastasis    Right ankle sprain 08/2017   w/distal fibula avulsion fx noted on u/s but not plain film-(Dr. Hudnall).   Skin cancer of arm, left    burned it off (01/04/2017)   TIA (transient ischemic attack)    L face and L arm weakness. Peri procedural->a. 03/22/2018 following cath. CT head neg. No MRI b/c has pacer. Likely due to embolus to distal branch of RMCA   Type II diabetes mellitus (HCC)     Past Surgical History:  Procedure Laterality Date   ABI's Bilateral 05/21/2018   normal   ANTERIOR LATERAL LUMBAR FUSION WITH PERCUTANEOUS SCREW 2 LEVEL Left 02/25/2024   Procedure: LEFT ANTERIOR LATERAL LUMBAR FUSION WITH PERCUTANEOUS PEDICLE SCREW LUMBAR TWO-LUMBAR THREE, LUMBAR THREE-LUMBAR  FOUR, POSTERIOR LATERAL AND INTERBODY FUSION;  Surgeon: Mason Shove, MD;  Location: [facility] OR;  Service: Neurosurgery;  Laterality: Left;  XLIF - left - L2-L3 - L3-L4 with perc pedicle screws - Posterior Lateral and Interbody fusion   BACK SURGERY     BIV ICD INSERTION CRT-D N/A 01/04/2017   Procedure: BiV ICD ;  Surgeon: Inocencio Soyla Lunger, MD;  Location[facility] INVASIVE CV LAB;  Service: Cardiovascular;  Laterality: N/A;   CARDIAC CATHETERIZATION N/A 06/14/2016   Minimal nonobstructive dz, EF 25-35%.  Procedure: Left Heart Cath and Coronary Angiography;  Surgeon: Peter M Swaziland, MD;  Location: [facility] INVASIVE CV LAB;  Service: Cardiovascular;  Laterality: N/A;   CARDIOVASCULAR STRESS TEST  2012   2012 nuclear perfusion study: low risk scan; 04/2016 normal myocardial perfusion imaging, EF 32%.   CARDIOVERSION  07/09/2012   Procedure: CARDIOVERSION;  Surgeon: Jerel Balding, MD;  Location: [facility] ENDOSCOPY;  Service: Cardiovascular;  Laterality: N/A;   CATARACT EXTRACTION W/ INTRAOCULAR LENS IMPLANT & ANTERIOR  VITRECTOMY, BILATERAL Bilateral    CIRCUMCISION N/A 03/23/2020   Procedure: CIRCUMCISION ADULT;  Surgeon: Rosalind Zachary NOVAK, MD;  Location: [facility] ORS;  Service: Urology;  Laterality: N/A;   COLONOSCOPY W/ POLYPECTOMY  approx 2006; repeated 09/2011   Polyps on 2013 EGD as well, repeat 12/2014   COLONOSCOPY WITH PROPOFOL  N/A 07/08/2021   adenoma x 1. Procedure: COLONOSCOPY WITH PROPOFOL ;  Surgeon: Rollin Dover, MD;  Location: [facility] ENDOSCOPY;  Service: Endoscopy;  Laterality: N/A;   ESOPHAGEAL DILATION  10/19/2023   Procedure: DILATION, ESOPHAGUS;  Surgeon: Rollin Dover, MD;  Location: [facility] ENDOSCOPY;  Service: Gastroenterology;;   ESOPHAGOGASTRODUODENOSCOPY  10/18/2006   Done due to chronic GERD: Normal, bx showed no barrett's esophagus (Dr. Rollin)   EYE SURGERY  01/08/2023   FLEXOR TENDON REPAIR Left 10/02/2016   Procedure: LEFT RING FINGER WOUND EXPORATION AND FLEXOR TENDON REPAIR AND NERVE REPAIR;  Surgeon:  Alm Hummer, MD;  Location: [facility] OR;  Service: Orthopedics;  Laterality: Left;   INSERT / REPLACE / REMOVE PACEMAKER  02/05/2012   dual chamber, sinus node dysfunction, sinus arrest, PAF, Medtronic Revo serial#[id number] H: last checked 05/2015   LUMBAR LAMINECTOMY Left 1976   L4-5   PACEMAKER REMOVAL  01/04/2017   PERMANENT PACEMAKER INSERTION N/A 02/05/2012   Procedure: PERMANENT PACEMAKER INSERTION;  Surgeon: Jerel Balding, MD; Generator Medtronic Dem[place] model [place] serial number [id number] H Laterality: N/A;   POLYPECTOMY  07/08/2021   Procedure: POLYPECTOMY;  Surgeon: Rollin Dover, MD;  Location: [facility] ENDOSCOPY;  Service: Endoscopy;;   RETINAL DETACHMENT SURGERY Left ~ 1999   REVERSE SHOULDER ARTHROPLASTY Left 2018   Left shoulder reverse [facility] in [place]).   RIGHT/LEFT HEART CATH AND CORONARY ANGIOGRAPHY N/A 03/22/2018   EF 30-35%, no CAD.  Procedure: RIGHT/LEFT HEART CATH AND CORONARY ANGIOGRAPHY;  Surgeon: Cherrie Toribio SAUNDERS, MD;  Location: [facility] INVASIVE CV LAB;  Service: Cardiovascular;  Laterality: N/A;   SAVORY DILATION N/A 10/19/2023   Procedure: EGD, WITH DILATION USING SAVARY-GILLIARD DILATOR OVER GUIDEWIRE;  Surgeon: Rollin Dover, MD;  Location: [facility] ENDOSCOPY;  Service: Gastroenterology;  Laterality: N/A;   TRANSTHORACIC ECHOCARDIOGRAM  08/25/10; 05/2012; 03/23/16;12/2017   mild asymmetric LVH, normal systolic function, normal diastolic fxn, mild-to-mod mitral regurg, mild aortic valve sclerosis and trace AI, mild aortic root dilatation. 2014 f/u showed EF 40-45%, mod LAE, A FIB.  02/2016 EF 40%, diffuse hypokinesis, grade 2 DD. 12/2017 EF 35-40%,diffuse hypokin,grd III DD, mild MR   WISDOM TOOTH EXTRACTION  06/27/2021   EF 50-55%, mild aortic root/ascending aorta dilatation (41-42 mm).    Family History  Problem Relation Age of Onset   Heart failure Mother    Stroke Mother    Stroke Father    Heart disease Sister    Heart disease Sister    Cancer Sister         liver   Cancer Brother        lung   Cancer Brother        lung   Heart disease Brother     Social History:  reports that he has never smoked. He has never used smokeless tobacco. He reports current alcohol use of about 3.0 - 4.0 standard drinks of alcohol per week. He reports that he does not use drugs.  Allergies: No Known Allergies  Medications: I have reviewed the patient's current medications.  Results for orders placed or performed during the hospital encounter of 03/16/24 (from the past 48 hours)  CBC     Status: None  Collection Time: 03/16/24  3:00 AM  Result Value Ref Range   WBC 8.4 4.0 - 10.5 K/uL   RBC 5.39 4.22 - 5.81 MIL/uL   Hemoglobin 16.2 13.0 - 17.0 g/dL   HCT 51.8 60.9 - 47.9 %   MCV 89.2 80.0 - 100.0 fL   MCH 30.1 26.0 - 34.0 pg   MCHC 33.7 30.0 - 36.0 g/dL   RDW 87.4 88.4 - 84.4 %   Platelets 190 150 - 400 K/uL   nRBC 0.0 0.0 - 0.2 %    Comment: Performed at [facility], [address], [place], [place] [address]  Comprehensive metabolic panel     Status: Abnormal   Collection Time: 03/16/24  3:00 AM  Result Value Ref Range   Sodium 136 135 - 145 mmol/L   Potassium 3.9 3.5 - 5.1 mmol/L   Chloride 102 98 - 111 mmol/L   CO2 25 22 - 32 mmol/L   Glucose, Bld 141 (H) 70 - 99 mg/dL    Comment: Glucose reference range applies only to samples taken after fasting for at least 8 hours.   BUN 23 8 - 23 mg/dL   Creatinine, Ser 8.61 (H) 0.61 - 1.24 mg/dL   Calcium  9.1 8.9 - 10.3 mg/dL   Total Protein 6.8 6.5 - 8.1 g/dL   Albumin 3.4 (L) 3.5 - 5.0 g/dL   AST 23 15 - 41 U/L   ALT 30 0 - 44 U/L   Alkaline Phosphatase 166 (H) 38 - 126 U/L   Total Bilirubin 0.9 0.0 - 1.2 mg/dL   GFR, Estimated 51 (L) >60 mL/min    Comment: (NOTE) Calculated using the CKD-EPI Creatinine Equation (2021)    Anion gap 9 5 - 15    Comment: Performed at [facility], [address], [place], [place] [address]   *Note: Due to a large number of results and/or encounters  for the requested time period, some results have not been displayed. A complete set of results can be found in Results Review.    CT L-SPINE NO CHARGE Result Date: 03/16/2024 CLINICAL DATA:  83 year old male with abdomen and flank pain. Status post spine surgery on 02/25/2024. EXAM: CT LUMBAR SPINE WITH CONTRAST TECHNIQUE: Technique: Multiplanar CT images of the lumbar spine were reconstructed from contemporary CT of the Abdomen and Pelvis. RADIATION DOSE REDUCTION: This exam was performed according to the departmental dose-optimization program which includes automated exposure control, adjustment of the mA and/or kV according to patient size and/or use of iterative reconstruction technique. CONTRAST:  No additional COMPARISON:  CT Abdomen and Pelvis today reported separately. Recent lumbar spine CT 03/03/2024. lumbar MRI 03/05/2024. FINDINGS: Segmentation: Normal, the same numbering system used on CTs and MRIs this month. Alignment: Stable. Dextroconvex upper and mild levoconvex lower lumbar scoliosis. Stable lordosis. Vertebrae: Hyperostosis related lower thoracic interbody ankylosis through the L1-L2 disc space. Superimposed postoperative changes L2 through L4. Left-side side unilateral pedicle screws and interbody implants. Hardware appears stable. See additional postoperative details below. Sacrum and SI joints appear stable and intact. No acute osseous abnormality identified. Paraspinal and other soft tissues: Abdomen and pelvis are reported separately. Of note, small volume gas in the medial right psoas muscle lateral to the postoperative L3-L4 disc space (series 4, image 69 and coronal image 24) appears related to the site of a postoperative seroma on the MRI this month, which seems regressed since that time. And compared to 03/03/2024, there is no progressed paraspinal soft tissue inflammation  by CT. Disc levels: Interbody ankylosis from flowing endplate osteophytes through the visible lower thoracic  spine and L1-L2 (coronal image 34). L2-L3: Stable fusion hardware. Stable residual disc osteophyte complex. No new or progressive features. L3-L4: Stable fusion hardware. Stable residual disc osteophyte complex. No new or progressive features. L4-L5: Chronic advanced disc and endplate degeneration including vacuum disc and disc osteophyte complex asymmetric to the right. This level appears stable. L5-S1: Less advanced chronic disc, endplate, moderate facet degeneration appears stable. IMPRESSION: 1. Stable postoperative CT appearance of the Lumbar spine since 03/03/2024, concordant with 03/05/2024 MRI. No complicating features are identified. 2. Small focus of gas in the right psoas muscle lateral to the postoperative disc space at L3-L4 is probably the site of regressing postoperative seroma. No acute paraspinal soft tissue inflammation by CT. 3. CT Abdomen and Pelvis today reported separately. Electronically Signed   By: VEAR Hurst M.D.   On: 03/16/2024 07:43   CT ABDOMEN PELVIS W CONTRAST Result Date: 03/16/2024 CLINICAL DATA:  Abdominal flank pain. EXAM: CT ABDOMEN AND PELVIS WITH CONTRAST TECHNIQUE: Multidetector CT imaging of the abdomen and pelvis was performed using the standard protocol following bolus administration of intravenous contrast. RADIATION DOSE REDUCTION: This exam was performed according to the departmental dose-optimization program which includes automated exposure control, adjustment of the mA and/or kV according to patient size and/or use of iterative reconstruction technique. CONTRAST:  75mL OMNIPAQUE  IOHEXOL  350 MG/ML SOLN COMPARISON:  03/03/2024 FINDINGS: Lower chest: Calcified granuloma noted posterior left lower lobe. Hepatobiliary: No suspicious focal abnormality within the liver parenchyma. There is no evidence for gallstones, gallbladder wall thickening, or pericholecystic fluid. No intrahepatic or extrahepatic biliary dilation. Pancreas: No focal mass lesion. No dilatation of the  main duct. No intraparenchymal cyst. No peripancreatic edema. Spleen: No splenomegaly. No suspicious focal mass lesion. Adrenals/Urinary Tract: No adrenal nodule or mass. Kidneys unremarkable. No evidence for hydroureter. Bladder is moderately distended. Stomach/Bowel: Stomach is unremarkable. No gastric wall thickening. No evidence of outlet obstruction. Duodenum is normally positioned as is the ligament of Treitz. No small bowel wall thickening. No small bowel dilatation. The terminal ileum is normal. The appendix is normal. No gross colonic mass. No colonic wall thickening. Vascular/Lymphatic: There is mild atherosclerotic calcification of the abdominal aorta without aneurysm. There is no gastrohepatic or hepatoduodenal ligament lymphadenopathy. No retroperitoneal or mesenteric lymphadenopathy. No pelvic sidewall lymphadenopathy. Reproductive: Prostate gland is enlarged. Other: No intraperitoneal free fluid. Tiny extraperitoneal gas bubbles are seen in the left retroperitoneal space (51/3) and in the left inguinal canal (85/3). This is substantially decreased in volume compared to the prior study from 03/03/2024. Previous study also had gas in the left abdominal wall which has resolved completely in the interval. Subcutaneous stranding with calcification in the left flank region is similar to prior. Musculoskeletal: Tiny gas bubble in the right psoas muscle is more prominent than on the previous study. There is high density material in this region of the psoas muscle, new in the interval in potentially an area of new mineralization. Patient is status post discectomy and posterior fusion at L2-3 and L3-4. Please see dedicated lumbar spine CT for more definitive characterization. IMPRESSION: 1. Tiny extraperitoneal gas bubbles in the left retroperitoneal space and left inguinal canal. This is substantially decreased in volume compared to the prior study from 03/03/2024. While residual soft tissue gas is not  typically expected more than 7-10 days from surgery, decreasing gas volume suggests that this is resolving postoperative change. Previous study also had gas  in the left abdominal wall which has resolved completely in the interval. 2. Tiny gas bubble in the right psoas muscle is more prominent than on the previous study. There is high density material in this region of the psoas muscle, new in the interval and potentially an area of new mineralization. Given the patient's history of persistent pain, correlation for psoas abscess suggested. Please see report for dedicated lumbar spine CT dictated separately. 3. Subcutaneous stranding with calcification in the left flank region is similar to prior, likely scarring. 4.  Aortic Atherosclerosis (ICD10-I70.0). Electronically Signed   By: Camellia Candle M.D.   On: 03/16/2024 07:25    Review of Systems  Constitutional:  Negative for chills, fever and malaise/fatigue.  HENT: Negative.    Eyes: Negative.   Respiratory: Negative.    Cardiovascular: Negative.   Gastrointestinal: Negative.   Genitourinary: Negative.   Musculoskeletal:  Positive for joint pain.  Skin: Negative.   Neurological:  Positive for weakness.  Endo/Heme/Allergies:  Bruises/bleeds easily.  Psychiatric/Behavioral: Negative.     Blood pressure 117/85, pulse 73, temperature 98.6 F (37 C), resp. rate 16, SpO2 100%. Estimated body mass index is 28.31 kg/m as calculated from the following:   Height as of 03/11/24: 5' 11 (1.803 m).   Weight as of 03/11/24: 92.1 kg.  Physical Exam Constitutional:      General: He is sleeping.     Appearance: He is well-developed.  HENT:     Head: Normocephalic and atraumatic.     Nose: Nose normal.     Mouth/Throat:     Mouth: Mucous membranes are moist.     Pharynx: Oropharynx is clear.  Cardiovascular:     Rate and Rhythm: Normal rate.  Pulmonary:     Effort: Pulmonary effort is normal.  Musculoskeletal:     Cervical back: Normal range of  motion and neck supple.     Left knee: Decreased range of motion. Tenderness present.  Skin:    General: Skin is warm and dry.     Capillary Refill: Capillary refill takes less than 2 seconds.     Findings: Bruising present.      Neurological:     General: No focal deficit present.     Mental Status: He is oriented to person, place, and time and easily aroused.  Psychiatric:        Mood and Affect: Mood normal.        Behavior: Behavior normal.     Assessment/Plan: Patient is 20 days status post two level XLIF by Dr. Louis. He continues to have issues with pain control. His follow up imaging and lab work are reassuring. He did have an abnormal CT of his left knee on 03/11/2024 that probably warrants further workup. Patient's daughter reports they saw an orthopedic provider who was working on getting Mr. Brockman an outpatient MRI. There are no clinical findings concerning for post operative infection at this time. Recommend admission for pain control and possibly placement at skilled nursing facility at discharge for further rehabilitation. He is scheduled for outpatient follow up with Dr. Louis on 03/27/2024.   I am in communication with my attending and they agree with the plan for this patient.   Mason Beck, DNP, AGNP-C Nurse Practitioner  [facility] [address], [place], [place] [address] P: [phone]    F: [phone]  03/16/2024, 10:26 AM

## 2024-03-16 NOTE — ED Triage Notes (Signed)
 Pt here from home with c/o left knee pain , pt had a ct of knee that showed a mass , pt has been taking oxycodone  at home with minimal relief , family has been trying to get out pt mri but found to be difficult due to pt having a pacemaker

## 2024-03-16 NOTE — Assessment & Plan Note (Signed)
 2D echo December 2022 with EF of 50-55% Appears euvolemic Monitor

## 2024-03-16 NOTE — H&P (Signed)
 History and Physical    Patient: Mason [name] FMW:[id number] DOB: 1941/03/19 DOA: 03/16/2024 DOS: the patient was seen and examined on 03/16/2024 PCP: Candise Aleene DEL, MD  Patient coming from: Home  Chief Complaint:  Chief Complaint  Patient presents with   Knee Pain   HPI: [name] is a 83 y.o. male with medical history significant of heart block s/p pacemaker, chronic HFrEF, stage 3 CKD, Afib, HLD, HTN presenting with back pain and knee pain. Pt noted to have recently admitted for  8/4-->8/6 with NSG for Degenerative spondylolisthesis. Had L sided L2-L4 RP interbody decompression and fusion. Per the patient and daughter, pt has had significant L sided knee pain and low back pain since the surgery. Denies any known falls. No reported fevers or chills. Pt denies any know trauma to the L kee. Does report a remote history of gout in the L knee. Symptoms at present are not simiilar to prior episodes of gout. Pt denies any significant redness or swelling of the knee. Denies any racidular sxs. Reports Severe pain with ambulation. Reports anterior pain in  the knee. Low back pain has been fairly constant since the procedure. No reported bowel or bladder incontence. Low back pain does not radate. Pt denies any known recent infections.  Presented to the ER afebrile, hemodynamically stable. WBC and CMP grossly WNL. CT L spine and CT A&P with Small focus of gas in the right psoas muscle lateral to the postoperative disc space at L3-L4 is probably the site of regressing postoperative seroma.  Review of Systems: As mentioned in the history of present illness. All other systems reviewed and are negative. Past Medical History:  Diagnosis Date   AICD (automatic cardioverter/defibrillator) present 2018   MDT CRT-D.  Fatigue-->completely pacer dependent.  Pacer settings adjusted 12/2017 to allow more chronotrophc variance with ADL's//exertion.   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   02/2016 EF 40%, diffuse hypokinesis, grade 2 DD.  Myoc perf imaging showed EF 32% 04/2016.  Pt upgraded to CRT-D 01/04/17.   Chronic renal insufficiency, stage III (moderate) (HCC) 2015   CrCl about 60 ml/min   Complete heart block (HCC)    Has dual chamber pacer.   COVID-19 virus infection 01/05/2021   paxlovid   Depression    DOE (dyspnea on exertion)    NYHA class II/III CHF   Dyspnea 2021   with exertion, bending over   Dysrhythmia    A. Fib   Episodic low back pain 01/22/2013   w/intermittent radiculitis (12/2014 his neurologist referred him to pain mgmt for epidural steroid injection)   Erectile dysfunction 2019   due to zoloft --urol rx'd viagra   GERD (gastroesophageal reflux disease)    H/O tilt table evaluation 11/02/2005   negative   Helicobacter pylori gastritis 01/2016   History of adenomatous polyp of colon 10/12/2011   Dr. Rollin (3 right side of colon- tubular adenomas removed)   History of cardiovascular stress test 05/28/2012   no ischemia, EF 37%, imaging results are unchanged and within normal variance   History of chronic prostatitis    History of kidney stones    History of  vertigo    + Hx of posterior HA's.  Neuro (Dr. Maurice) eval 2011.  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   Hyperlipidemia    Hypertension    Lumbar spondylosis    lumbosacral radiculopathy at L4 by EMG testing, right foot drop (neurologist is Dr. Birder with [facility] in [place])--neurologist referred him to neurosurgery   Migraine    used to have them all the time; none for years  (01/04/2017)   Myocardial infarction [facility]) ?1970s   not entirely certain of this   Nephrolithiasis 07/2012   Left UVJ 2 mm stone with dilation of renal collecting system and slight hydroureter on right   Neuropathy    NICM (nonischemic cardiomyopathy) (HCC)    a. 02/2018 Cath: LM nl, LAD min irregs, LCX no, RCA 20d. [id number]. Fick CO/CI 4.4/2.0.   Osteoarthritis, multiple sites    Shoulders, back, knees   Pacemaker 02/05/2012   dual chamber, complete heart block, meddtronic revo, lasted checked 12/2015.  Since no CAD on cath 05/2016, cards recommends upgrade to CRT-D.   Peripheral vascular disease (HCC)    Peripheral Artery Disease   Permanent atrial fibrillation (HCC)    DCCV 07/09/13-converted, lasted two days, then back into afib--needs lifetime anticoagulation (Xarelto  as of 09/2014)   Prostate cancer screening 09/2017   done by urol annually (normal prostate exam documented + PSA 0.84 as of 10/01/17 urol f/u.  10/2018 urol f/u PSA 0.6, no prostate nodule.   Rectus diastasis    Right ankle sprain 08/2017   w/distal fibula avulsion fx noted on u/s but not plain film-(Dr. Hudnall).   Skin cancer of arm, left    burned it off (01/04/2017)   TIA (transient ischemic attack)    L face and L arm weakness. Peri procedural->a. 03/22/2018 following cath. CT head neg. No MRI b/c has pacer. Likely due to embolus to distal branch of RMCA   Type II diabetes mellitus (HCC)    Past Surgical History:  Procedure Laterality Date   ABI's Bilateral 05/21/2018   normal   ANTERIOR LATERAL LUMBAR FUSION WITH PERCUTANEOUS SCREW 2 LEVEL Left 02/25/2024   Procedure: LEFT ANTERIOR LATERAL LUMBAR FUSION WITH PERCUTANEOUS PEDICLE SCREW LUMBAR TWO-LUMBAR THREE, LUMBAR THREE-LUMBAR FOUR, POSTERIOR LATERAL AND INTERBODY FUSION;  Surgeon: Louis Shove, MD;  Location: [facility] OR;  Service: Neurosurgery;  Laterality: Left;  XLIF - left - L2-L3 - L3-L4 with perc pedicle screws - Posterior Lateral and Interbody fusion   BACK SURGERY      BIV ICD INSERTION CRT-D N/A 01/04/2017   Procedure: BiV ICD ;  Surgeon: Inocencio Soyla Lunger, MD;  Location: [facility] INVASIVE CV LAB;  Service: Cardiovascular;  Laterality: N/A;   CARDIAC CATHETERIZATION N/A 06/14/2016   Minimal nonobstructive dz, EF 25-35%.  Procedure: Left Heart Cath and Coronary Angiography;  Surgeon: Peter M Swaziland, MD;  Location: [facility] INVASIVE CV LAB;  Service: Cardiovascular;  Laterality: N/A;   CARDIOVASCULAR STRESS TEST  2012   2012 nuclear perfusion study: low risk scan; 04/2016 normal myocardial perfusion imaging, EF 32%.   CARDIOVERSION  07/09/2012   Procedure: CARDIOVERSION;  Surgeon: Jerel Balding, MD;  Location: [facility] ENDOSCOPY;  Service: Cardiovascular;  Laterality: N/A;   CATARACT EXTRACTION W/ INTRAOCULAR LENS IMPLANT & ANTERIOR VITRECTOMY, BILATERAL Bilateral    CIRCUMCISION N/A 03/23/2020   Procedure: CIRCUMCISION ADULT;  Surgeon: Rosalind Zachary NOVAK, MD;  Location: [facility] ORS;  Service: Urology;  Laterality: N/A;   COLONOSCOPY W/ POLYPECTOMY  approx 2006; repeated 09/2011   Polyps on 2013 EGD as well, repeat 12/2014   COLONOSCOPY WITH PROPOFOL  N/A 07/08/2021   adenoma x 1. Procedure: COLONOSCOPY WITH PROPOFOL ;  Surgeon: Rollin Dover, MD;  Location: [facility] ENDOSCOPY;  Service: Endoscopy;  Laterality: N/A;   ESOPHAGEAL DILATION  10/19/2023   Procedure: DILATION, ESOPHAGUS;  Surgeon: Rollin Dover, MD;  Location: [facility] ENDOSCOPY;  Service: Gastroenterology;;   ESOPHAGOGASTRODUODENOSCOPY  10/18/2006   Done due to chronic GERD: Normal, bx showed no barrett's esophagus (Dr. Rollin)   EYE SURGERY  01/08/2023   FLEXOR TENDON REPAIR Left 10/02/2016   Procedure: LEFT RING FINGER WOUND EXPORATION AND FLEXOR TENDON REPAIR AND NERVE REPAIR;  Surgeon: Alm Hummer, MD;  Location: [facility] OR;  Service: Orthopedics;  Laterality: Left;   INSERT / REPLACE / REMOVE PACEMAKER  02/05/2012   dual chamber, sinus node dysfunction, sinus arrest, PAF, Medtronic Revo serial#[id number] H: last checked 05/2015    LUMBAR LAMINECTOMY Left 1976   L4-5   PACEMAKER REMOVAL  01/04/2017   PERMANENT PACEMAKER INSERTION N/A 02/05/2012   Procedure: PERMANENT PACEMAKER INSERTION;  Surgeon: Jerel Balding, MD; Generator Medtronic Gleason model [place] serial number [id number] H Laterality: N/A;   POLYPECTOMY  07/08/2021   Procedure: POLYPECTOMY;  Surgeon: Rollin Dover, MD;  Location: [facility] ENDOSCOPY;  Service: Endoscopy;;   RETINAL DETACHMENT SURGERY Left ~ 1999   REVERSE SHOULDER ARTHROPLASTY Left 2018   Left shoulder reverse [facility] in [place]).   RIGHT/LEFT HEART CATH AND CORONARY ANGIOGRAPHY N/A 03/22/2018   EF 30-35%, no CAD.  Procedure: RIGHT/LEFT HEART CATH AND CORONARY ANGIOGRAPHY;  Surgeon: Cherrie Toribio SAUNDERS, MD;  Location: [facility] INVASIVE CV LAB;  Service: Cardiovascular;  Laterality: N/A;   SAVORY DILATION N/A 10/19/2023   Procedure: EGD, WITH DILATION USING SAVARY-GILLIARD DILATOR OVER GUIDEWIRE;  Surgeon: Rollin Dover, MD;  Location: [facility] ENDOSCOPY;  Service: Gastroenterology;  Laterality: N/A;   TRANSTHORACIC ECHOCARDIOGRAM  08/25/10; 05/2012; 03/23/16;12/2017   mild asymmetric LVH, normal systolic function, normal diastolic fxn, mild-to-mod mitral regurg, mild aortic valve sclerosis and trace AI, mild aortic root dilatation. 2014 f/u showed EF 40-45%, mod LAE, A FIB.  02/2016 EF 40%, diffuse hypokinesis, grade 2 DD. 12/2017 EF 35-40%,diffuse hypokin,grd III DD, mild MR   WISDOM TOOTH EXTRACTION  06/27/2021   EF 50-55%, mild aortic root/ascending aorta dilatation (41-42 mm).   Social History:  reports that he has never smoked. He has never used smokeless tobacco. He reports current alcohol use of about 3.0 - 4.0 standard drinks of alcohol per week. He reports that he does not use drugs.  No Known Allergies  Family History  Problem Relation Age of Onset   Heart failure Mother    Stroke Mother    Stroke Father    Heart disease Sister    Heart disease Sister    Cancer Sister        liver    Cancer Brother        lung   Cancer Brother        lung   Heart disease Brother     Prior to Admission medications   Medication Sig Start Date End Date Taking? Authorizing Provider  chlorhexidine  (HIBICLENS ) 4 % external liquid Apply 15 mLs (1 Application total) topically as directed for 30 doses. Use as directed daily for 5 days every other week for 6 weeks. 02/25/24  Yes Pool, Victory, MD  COMBIGAN  0.2-0.5 % ophthalmic solution Place 1 drop into both eyes every 12 (twelve) hours. 04/19/16  Yes [provider]  docusate sodium  (COLACE) 250 MG capsule Take 1 capsule (250 mg total) by mouth daily. 03/03/24  Yes Mannie Pac T, DO  ENTRESTO  97-103 MG TAKE 1 TABLET TWO TIMES A DAY 06/01/23  Yes Croitoru, Mihai, MD  eplerenone  (INSPRA ) 25 MG tablet TAKE 1 TABLET DAILY 03/27/23  Yes Croitoru, Mihai, MD  gabapentin  (NEURONTIN ) 300 MG capsule TAKE 2 CAPSULES TWICE A DAY 07/02/23  Yes McGowen, Aleene DEL, MD  HYDROmorphone  (DILAUDID ) 2 MG tablet Take 0.5 tablets (1 mg total) by mouth every 6 (six) hours as needed for severe pain (pain score 7-10). 03/11/24  Yes Garrick Charleston, MD  insulin  aspart (NOVOLOG  FLEXPEN) 100 UNIT/ML FlexPen Inject 10 units before each meal 03/07/23  Yes Trixie File, MD  insulin  glargine (LANTUS  SOLOSTAR) 100 UNIT/ML Solostar Pen Inject 30-34 Units into the skin daily. 01/29/24  Yes Trixie File, MD  JARDIANCE  10 MG TABS tablet TAKE 1 TABLET DAILY 02/05/24  Yes Trixie File, MD  latanoprost  (XALATAN ) 0.005 % ophthalmic solution Place 1 drop into both eyes at bedtime. 01/31/24  Yes [provider]  lidocaine  (LIDODERM ) 5 % PLACE 1 PATCH ON THE SKIN DAILY. REMOVE AND DISCARD PATCH WITHIN 12 HOURS OR AS DIRECTED BY DOCTOR 10/19/23  Yes McGowen, Aleene DEL, MD  meclizine  (ANTIVERT ) 25 MG tablet TAKE 1 TABLET THREE TIMES A DAY AS NEEDED FOR DIZZINESS 07/02/23  Yes McGowen, Aleene DEL, MD  methocarbamol  (ROBAXIN ) 500 MG tablet Take 2 tablets (1,000 mg total) by mouth  every 6 (six) hours as needed for muscle spasms. 03/05/24  Yes Kammerer, Megan L, DO  mupirocin  ointment (BACTROBAN ) 2 % Place 1 Application into the nose 2 (two) times daily for 60 doses. Use as directed 2 times daily for 5 days every other week for 6 weeks. 02/25/24 03/26/24 Yes Pool, Victory, MD  omeprazole  (PRILOSEC) 40 MG capsule Take 40 mg by mouth daily.   Yes [provider]  oxybutynin  (DITROPAN -XL) 10 MG 24 hr tablet Take 1 tablet (10 mg total) by mouth at bedtime. 02/12/24  Yes McGowen, Aleene DEL, MD  polyethylene glycol (MIRALAX ) 17 g packet Take 17 g by mouth daily. 03/03/24  Yes Mannie Pac T, DO  rOPINIRole  (REQUIP ) 1 MG tablet Take 1 tablet (1 mg total) by mouth at bedtime. 02/20/24  Yes McGowen, Aleene DEL, MD  rosuvastatin  (CRESTOR ) 20 MG tablet Take 20 mg by mouth at bedtime.   Yes [provider]  sertraline  (ZOLOFT ) 100 MG tablet TAKE 1 TABLET DAILY 08/21/23  Yes McGowen, Aleene DEL, MD  XARELTO  20 MG TABS tablet TAKE 1 TABLET DAILY WITH SUPPER 09/25/23  Yes Croitoru, Mihai, MD  methocarbamol  (ROBAXIN ) 500 MG tablet Take 1 tablet (500 mg total) by mouth every 6 (six) hours as needed for muscle spasms. 02/27/24   Louis Victory, MD  metoprolol  succinate (TOPROL -XL) 50 MG 24 hr tablet TAKE 1 TABLET DAILY. TAKE WITH OR IMMEDIATELY FOLLLOWING A MEAL Patient not taking: Reported on 03/16/2024 03/12/23   McGowen, Philip H, MD  predniSONE  (DELTASONE ) 20 MG tablet Take 2 tablets (40 mg total) by mouth daily with breakfast. For the next four days Patient not taking: Reported on 03/14/2024 03/11/24   Garrick Charleston, MD  Semaglutide , 1 MG/DOSE, 4 MG/3ML SOPN Inject 1 mg as directed once a week. Patient not taking: Reported on 03/14/2024 10/11/23   Trixie File, MD    Physical Exam: Vitals:   03/16/24 1025 03/16/24 1235 03/16/24 1311 03/16/24 1431  BP: 117/85 124/76 108/63  Pulse: 73 98 71   Resp: 16 16 18    Temp: 98.6 F (37 C) 98.1 F (36.7 C) 97.7 F (36.5 C)   TempSrc:  Oral  Oral   SpO2: 100% 100% 100%   Height:    5' 11 (1.803 m)   Physical Exam Constitutional:      Appearance: He is normal weight.  HENT:     Head: Normocephalic and atraumatic.     Nose: Nose normal.     Mouth/Throat:     Mouth: Mucous membranes are moist.  Eyes:     Pupils: Pupils are equal, round, and reactive to light.  Cardiovascular:     Rate and Rhythm: Normal rate and regular rhythm.  Pulmonary:     Effort: Pulmonary effort is normal.  Abdominal:     General: Abdomen is flat.  Musculoskeletal:        General: Normal range of motion.     Cervical back: Normal range of motion.  Skin:    General: Skin is warm.  Neurological:     General: No focal deficit present.  Psychiatric:        Mood and Affect: Mood normal.     Data Reviewed:  There are no new results to review at this time.  CT L-SPINE NO CHARGE CLINICAL DATA:  83 year old male with abdomen and flank pain. Status post spine surgery on 02/25/2024.  EXAM: CT LUMBAR SPINE WITH CONTRAST  TECHNIQUE: Technique: Multiplanar CT images of the lumbar spine were reconstructed from contemporary CT of the Abdomen and Pelvis.  RADIATION DOSE REDUCTION: This exam was performed according to the departmental dose-optimization program which includes automated exposure control, adjustment of the mA and/or kV according to patient size and/or use of iterative reconstruction technique.  CONTRAST:  No additional  COMPARISON:  CT Abdomen and Pelvis today reported separately. Recent lumbar spine CT 03/03/2024. lumbar MRI 03/05/2024.  FINDINGS: Segmentation: Normal, the same numbering system used on CTs and MRIs this month.  Alignment: Stable. Dextroconvex upper and mild levoconvex lower lumbar scoliosis. Stable lordosis.  Vertebrae: Hyperostosis related lower thoracic interbody ankylosis through the L1-L2 disc space. Superimposed postoperative changes L2 through L4. Left-side side unilateral pedicle screws and  interbody implants. Hardware appears stable. See additional postoperative details below.  Sacrum and SI joints appear stable and intact. No acute osseous abnormality identified.  Paraspinal and other soft tissues: Abdomen and pelvis are reported separately. Of note, small volume gas in the medial right psoas muscle lateral to the postoperative L3-L4 disc space (series 4, image 69 and coronal image 24) appears related to the site of a postoperative seroma on the MRI this month, which seems regressed since that time. And compared to 03/03/2024, there is no progressed paraspinal soft tissue inflammation by CT.  Disc levels: Interbody ankylosis from flowing endplate osteophytes through the visible lower thoracic spine and L1-L2 (coronal image 34).  L2-L3: Stable fusion hardware. Stable residual disc osteophyte complex. No new or progressive features.  L3-L4: Stable fusion hardware. Stable residual disc osteophyte complex. No new or progressive features.  L4-L5: Chronic advanced disc and endplate degeneration including vacuum disc and disc osteophyte complex asymmetric to the right. This level appears stable.  L5-S1: Less advanced chronic disc, endplate, moderate facet degeneration appears stable.  IMPRESSION: 1. Stable postoperative CT appearance of the Lumbar spine since 03/03/2024, concordant with 03/05/2024 MRI. No complicating features are identified.  2. Small focus of gas in the right psoas muscle lateral to the postoperative disc space at  L3-L4 is probably the site of regressing postoperative seroma. No acute paraspinal soft tissue inflammation by CT.  3. CT Abdomen and Pelvis today reported separately.  Electronically Signed   By: VEAR Hurst M.D.   On: 03/16/2024 07:43 CT ABDOMEN PELVIS W CONTRAST CLINICAL DATA:  Abdominal flank pain.  EXAM: CT ABDOMEN AND PELVIS WITH CONTRAST  TECHNIQUE: Multidetector CT imaging of the abdomen and pelvis was performed using  the standard protocol following bolus administration of intravenous contrast.  RADIATION DOSE REDUCTION: This exam was performed according to the departmental dose-optimization program which includes automated exposure control, adjustment of the mA and/or kV according to patient size and/or use of iterative reconstruction technique.  CONTRAST:  75mL OMNIPAQUE  IOHEXOL  350 MG/ML SOLN  COMPARISON:  03/03/2024  FINDINGS: Lower chest: Calcified granuloma noted posterior left lower lobe.  Hepatobiliary: No suspicious focal abnormality within the liver parenchyma. There is no evidence for gallstones, gallbladder wall thickening, or pericholecystic fluid. No intrahepatic or extrahepatic biliary dilation.  Pancreas: No focal mass lesion. No dilatation of the main duct. No intraparenchymal cyst. No peripancreatic edema.  Spleen: No splenomegaly. No suspicious focal mass lesion.  Adrenals/Urinary Tract: No adrenal nodule or mass. Kidneys unremarkable. No evidence for hydroureter. Bladder is moderately distended.  Stomach/Bowel: Stomach is unremarkable. No gastric wall thickening. No evidence of outlet obstruction. Duodenum is normally positioned as is the ligament of Treitz. No small bowel wall thickening. No small bowel dilatation. The terminal ileum is normal. The appendix is normal. No gross colonic mass. No colonic wall thickening.  Vascular/Lymphatic: There is mild atherosclerotic calcification of the abdominal aorta without aneurysm. There is no gastrohepatic or hepatoduodenal ligament lymphadenopathy. No retroperitoneal or mesenteric lymphadenopathy. No pelvic sidewall lymphadenopathy.  Reproductive: Prostate gland is enlarged.  Other: No intraperitoneal free fluid. Tiny extraperitoneal gas bubbles are seen in the left retroperitoneal space (51/3) and in the left inguinal canal (85/3). This is substantially decreased in volume compared to the prior study from 03/03/2024.  Previous study also had gas in the left abdominal wall which has resolved completely in the interval. Subcutaneous stranding with calcification in the left flank region is similar to prior.  Musculoskeletal: Tiny gas bubble in the right psoas muscle is more prominent than on the previous study. There is high density material in this region of the psoas muscle, new in the interval in potentially an area of new mineralization. Patient is status post discectomy and posterior fusion at L2-3 and L3-4. Please see dedicated lumbar spine CT for more definitive characterization.  IMPRESSION: 1. Tiny extraperitoneal gas bubbles in the left retroperitoneal space and left inguinal canal. This is substantially decreased in volume compared to the prior study from 03/03/2024. While residual soft tissue gas is not typically expected more than 7-10 days from surgery, decreasing gas volume suggests that this is resolving postoperative change. Previous study also had gas in the left abdominal wall which has resolved completely in the interval. 2. Tiny gas bubble in the right psoas muscle is more prominent than on the previous study. There is high density material in this region of the psoas muscle, new in the interval and potentially an area of new mineralization. Given the patient's history of persistent pain, correlation for psoas abscess suggested. Please see report for dedicated lumbar spine CT dictated separately. 3. Subcutaneous stranding with calcification in the left flank region is similar to prior, likely scarring. 4.  Aortic Atherosclerosis (ICD10-I70.0).  Electronically Signed   By: Camellia Candle M.D.   On:  03/16/2024 07:25  Lab Results  Component Value Date   WBC 8.4 03/16/2024   HGB 16.2 03/16/2024   HCT 48.1 03/16/2024   MCV 89.2 03/16/2024   PLT 190 03/16/2024   Last metabolic panel Lab Results  Component Value Date   GLUCOSE 141 (H) 03/16/2024   NA 136 03/16/2024   K 3.9  03/16/2024   CL 102 03/16/2024   CO2 25 03/16/2024   BUN 23 03/16/2024   CREATININE 1.38 (H) 03/16/2024   GFRNONAA 51 (L) 03/16/2024   CALCIUM  9.1 03/16/2024   PROT 6.8 03/16/2024   ALBUMIN 3.4 (L) 03/16/2024   BILITOT 0.9 03/16/2024   ALKPHOS 166 (H) 03/16/2024   AST 23 03/16/2024   ALT 30 03/16/2024   ANIONGAP 9 03/16/2024    Assessment and Plan: Left knee pain Severe left knee pain x 2 weeks No traumatic etiology noted at present-patient denies any recent falls No significant swelling on exam Noted August 19 CT of the knee imaging showing  Extensive chondrocalcinosis along the popliteus tendon proximally and to a lesser extent of the distal semimembranosus tendon, articular joint space narrowing in the medial compartment, Sclerotic lesion with localized trabecular accentuation posteromedially in the distal femoral metadiaphysis   Differential diagnosis includes infectious, inflammatory, degenerative  etiologies No gross signs of infection noted at present No overt trauma reported  Symptomatology not consistent with radiculopathy in setting of most recent L2-L4 procedure. Knee without any gross swelling redness or tenderness Will check sed rate and CRP Dr. Georgina reported being consulted for further evaluation Pain control in the interim Consider repeat imaging as appropriate Follow-up orthopedic consultation  Low back pain Recurrent low back pain with recent uncomplicated left-sided L2-3 and L3-4 retroperitoneal interbody decompression and fusion with posterior percutaneous fixation. Family denies any recent falls or trauma Noted prior issues of radicular symptoms in the past This may be confounding issue in the setting left-sided knee pain Neurovascularly intact distally Noted regressing L3/L4 seroma on imaging today Neurosurgery consulted Follow-up recommendations Pain control interim  CKD (chronic kidney disease), stage III (HCC) Creatinine 1.38 with GFR in the  40s Looks to be near baseline Monitor  CHF (congestive heart failure) (HCC) 2D echo December 2022 with EF of 50-55% Appears euvolemic Monitor    HTN (hypertension), benign BP stable Titrate home regimen  Hyperlipidemia Continue statin  Atrial fibrillation, permanent Rate controlled at present Continue home regimen including Xarelto   T2DM (type 2 diabetes mellitus) (HCC) Blood sugar 140s  SSI  A1C  Monitor   Complete heart block (HCC) S/p pacemaker 12/2016  Paced rhythm on EKG  HR stable  Follow        Advance Care Planning:   Code Status: Full Code   Consults: NSG, Ortho   Family Communication: Daughter at the bedside   Severity of Illness: The appropriate patient status for this patient is OBSERVATION. Observation status is judged to be reasonable and necessary in order to provide the required intensity of service to ensure the patient's safety. The patient's presenting symptoms, physical exam findings, and initial radiographic and laboratory data in the context of their medical condition is felt to place them at decreased risk for further clinical deterioration. Furthermore, it is anticipated that the patient will be medically stable for discharge from the hospital within 2 midnights of admission.   Author: Elspeth JINNY Masters, MD 03/16/2024 3:17 PM  For on call review [email or web address].

## 2024-03-16 NOTE — Progress Notes (Signed)
 Orthopedic Update  Aspirate did not show any crystals. CRP was wnl. With his CRP being normal and his reassuring exam, no concern for septic arthritis at this time. Crystals being negative points against gout or pseudogout. Would still get MRI of the knee with and without contrast to evaluate the lesion in his femur. However, this is unlikely to be the etiology of his current pain as tumor related pain is usually gradual in onset and more mechanical in nature. This type of pain would be highly unlikely to start suddenly after a surgery unless there was some associated fracture from postioning intra-op and there is no fracture seen on his CT scan of the knee.   Mason DELENA Ada, MD Orthopedic Surgeon

## 2024-03-16 NOTE — Progress Notes (Signed)
 RN went in to give Percocet and pt sleeping soundly. Med given to [employer] to return.

## 2024-03-16 NOTE — Assessment & Plan Note (Signed)
 Blood sugar 140s  SSI  A1C  Monitor

## 2024-03-16 NOTE — Assessment & Plan Note (Addendum)
 Rate controlled at present Continue home regimen including Xarelto 

## 2024-03-16 NOTE — Plan of Care (Signed)
  Problem: Activity: Goal: Risk for activity intolerance will decrease Outcome: Progressing   Problem: Pain Managment: Goal: General experience of comfort will improve and/or be controlled Outcome: Not Progressing

## 2024-03-16 NOTE — ED Provider Notes (Signed)
 [name] AT [facility] Provider Note   CSN: [id number] Arrival date & time: 03/16/24  9764     Patient presents with: Knee Pain   Mason [name] is a 83 y.o. male.   The history is provided by the patient, a relative and medical records.  Knee Pain Mason [name] is a 83 y.o. male who presents to the Emergency [name] complaining of knee pain. He presents the emergency [name] accompanied by his granddaughter for evaluation of severe left knee pain and left side pain. He is status post lumbar fusion on August 4. He states that since that procedure he is experienced severe pain in his left knee that is constant. He over the last two weeks has developed progression and worsening of this pain that radiates up to his left hip. No reported falls but he does have progressive weakness in both legs, left greater than right. No fevers. No nausea, vomiting. No loss of bowel or bladder but he has had difficulty getting to the bathroom in time secondary to pain with movement. He has been taking oxycodone  with no improvement in his knee pain. He has been seen by neurosurgery as an outpatient for follow-up as well as orthopedics due to abnormal x-ray imaging.     Prior to Admission medications   Medication Sig Start Date End Date Taking? Authorizing Provider  chlorhexidine  (HIBICLENS ) 4 % external liquid Apply 15 mLs (1 Application total) topically as directed for 30 doses. Use as directed daily for 5 days every other week for 6 weeks. 02/25/24   Louis Shove, MD  COMBIGAN  0.2-0.5 % ophthalmic solution Place 1 drop into both eyes every 12 (twelve) hours. 04/19/16   [provider]  diclofenac  Sodium (VOLTAREN ) 1 % GEL Apply 4 g topically 4 (four) times daily. 12/17/20   McGowen, Aleene DEL, MD  docusate sodium  (COLACE) 250 MG capsule Take 1 capsule (250 mg total) by mouth daily. 03/03/24   Mannie Pac T, DO  ENTRESTO  97-103 MG TAKE 1 TABLET TWO TIMES A DAY 06/01/23    Croitoru, Mihai, MD  eplerenone  (INSPRA ) 25 MG tablet TAKE 1 TABLET DAILY 03/27/23   Croitoru, Jerel, MD  gabapentin  (NEURONTIN ) 300 MG capsule TAKE 2 CAPSULES TWICE A DAY 07/02/23   McGowen, Aleene DEL, MD  HYDROmorphone  (DILAUDID ) 2 MG tablet Take 0.5 tablets (1 mg total) by mouth every 6 (six) hours as needed for severe pain (pain score 7-10). 03/11/24   Garrick Charleston, MD  insulin  aspart (NOVOLOG  FLEXPEN) 100 UNIT/ML FlexPen Inject 10 units before each meal 03/07/23   Trixie File, MD  insulin  glargine (LANTUS  SOLOSTAR) 100 UNIT/ML Solostar Pen Inject 30-34 Units into the skin daily. 01/29/24   Trixie File, MD  JARDIANCE  10 MG TABS tablet TAKE 1 TABLET DAILY 02/05/24   Trixie File, MD  latanoprost  (XALATAN ) 0.005 % ophthalmic solution Place 1 drop into both eyes at bedtime. 01/31/24   [provider]  lidocaine  (LIDODERM ) 5 % PLACE 1 PATCH ON THE SKIN DAILY. REMOVE AND DISCARD PATCH WITHIN 12 HOURS OR AS DIRECTED BY DOCTOR 10/19/23   McGowen, Aleene DEL, MD  meclizine  (ANTIVERT ) 25 MG tablet TAKE 1 TABLET THREE TIMES A DAY AS NEEDED FOR DIZZINESS 07/02/23   McGowen, Aleene DEL, MD  methocarbamol  (ROBAXIN ) 500 MG tablet Take 1 tablet (500 mg total) by mouth every 6 (six) hours as needed for muscle spasms. 02/27/24   Louis Shove, MD  methocarbamol  (ROBAXIN ) 500 MG tablet Take 2 tablets (1,000  mg total) by mouth every 6 (six) hours as needed for muscle spasms. 03/05/24   Kammerer, Megan L, DO  metoprolol  succinate (TOPROL -XL) 50 MG 24 hr tablet TAKE 1 TABLET DAILY. TAKE WITH OR IMMEDIATELY FOLLLOWING A MEAL 03/12/23   McGowen, Aleene DEL, MD  mupirocin  ointment (BACTROBAN ) 2 % Place 1 Application into the nose 2 (two) times daily for 60 doses. Use as directed 2 times daily for 5 days every other week for 6 weeks. 02/25/24 03/26/24  Louis Shove, MD  omeprazole  (PRILOSEC) 40 MG capsule Take 40 mg by mouth daily.    [provider]  oxybutynin  (DITROPAN -XL) 10 MG 24 hr tablet Take 1 tablet  (10 mg total) by mouth at bedtime. 02/12/24   McGowen, Aleene DEL, MD  polyethylene glycol (MIRALAX ) 17 g packet Take 17 g by mouth daily. 03/03/24   Mannie Fairy DASEN, DO  predniSONE  (DELTASONE ) 20 MG tablet Take 2 tablets (40 mg total) by mouth daily with breakfast. For the next four days Patient not taking: Reported on 03/14/2024 03/11/24   Garrick Charleston, MD  rOPINIRole  (REQUIP ) 1 MG tablet Take 1 tablet (1 mg total) by mouth at bedtime. 02/20/24   McGowen, Aleene DEL, MD  rosuvastatin  (CRESTOR ) 20 MG tablet Take 20 mg by mouth at bedtime.    [provider]  Semaglutide , 1 MG/DOSE, 4 MG/3ML SOPN Inject 1 mg as directed once a week. Patient not taking: Reported on 03/14/2024 10/11/23   Trixie File, MD  sertraline  (ZOLOFT ) 100 MG tablet TAKE 1 TABLET DAILY 08/21/23   McGowen, Aleene DEL, MD  XARELTO  20 MG TABS tablet TAKE 1 TABLET DAILY WITH SUPPER 09/25/23   Croitoru, Jerel, MD    Allergies: Patient has no known allergies.    Review of Systems  All other systems reviewed and are negative.   Updated Vital Signs BP 97/61 (BP Location: Right Arm)   Pulse 73   Temp 97.9 F (36.6 C) (Oral)   Resp (!) 22   SpO2 100%   Physical Exam Vitals and nursing note reviewed.  Constitutional:      Appearance: He is well-developed.     Comments: Uncomfortable appearing  HENT:     Head: Normocephalic and atraumatic.  Cardiovascular:     Rate and Rhythm: Normal rate and regular rhythm.     Heart sounds: No murmur heard. Pulmonary:     Effort: Pulmonary effort is normal. No respiratory distress.     Breath sounds: Normal breath sounds.  Abdominal:     Palpations: Abdomen is soft.     Tenderness: There is no abdominal tenderness. There is no guarding or rebound.  Musculoskeletal:        General: No tenderness.     Comments: 2+ DP pulses bilaterally. There is no appreciable tenderness over the left knee. No soft tissue swelling. He is able to flex and extend the knee. There is ecchymosis  over his low back, small amount of ecchymosis over his left flank incision site  Skin:    General: Skin is warm and dry.  Neurological:     Mental Status: He is alert and oriented to person, place, and time.     Comments: Five out of five strength and bilateral upper extremities. 4+ of five strength and bilateral lower extremities, left is slightly weaker than the right. He has decreased sensation to bilateral feet although this appears to be chronic in nature. Dorsiflexion plantarflexion are intact.  Psychiatric:        Behavior:  Behavior normal.     (all labs ordered are listed, but only abnormal results are displayed) Labs Reviewed  COMPREHENSIVE METABOLIC PANEL WITH GFR - Abnormal; Notable for the following components:      Result Value   Glucose, Bld 141 (*)    Creatinine, Ser 1.38 (*)    Albumin 3.4 (*)    Alkaline Phosphatase 166 (*)    GFR, Estimated 51 (*)    All other components within normal limits  CBC    EKG: None     Procedures   Medications Ordered in the ED  HYDROmorphone  (DILAUDID ) injection 1 mg (1 mg Intravenous Given 03/16/24 0531)  iohexol  (OMNIPAQUE ) 350 MG/ML injection 75 mL (75 mLs Intravenous Contrast Given 03/16/24 9388)                                    Medical Decision Making Amount and/or Complexity of Data Reviewed Labs: ordered. Radiology: ordered.  Risk Prescription drug management.   Patient status post lumbar fusion on August 4 here for evaluation of persistent worsening pain to the left knee and left leg. He is well perfused on examination, no evidence of soft tissue infection at this time. He does have severe pain, will treat with hydromorphone . Given degree of pain, anticoagulation and ecchymosis that is postop site repeat imaging will be obtained at this time. Neurosurgery consulted given his persistent pain. Patient care transferred pending neurosurgery evaluation, CT scan report.     Final diagnoses:  None    ED Discharge  Orders     None          Griselda Norris, MD 03/16/24 [id number]

## 2024-03-16 NOTE — Assessment & Plan Note (Signed)
 Recurrent low back pain with recent uncomplicated left-sided L2-3 and L3-4 retroperitoneal interbody decompression and fusion with posterior percutaneous fixation. Family denies any recent falls or trauma Noted prior issues of radicular symptoms in the past This may be confounding issue in the setting left-sided knee pain Neurovascularly intact distally Noted regressing L3/L4 seroma on imaging today Neurosurgery consulted Follow-up recommendations Pain control interim

## 2024-03-16 NOTE — Assessment & Plan Note (Signed)
 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

## 2024-03-16 NOTE — Assessment & Plan Note (Signed)
 BP stable Titrate home regimen

## 2024-03-16 NOTE — Assessment & Plan Note (Signed)
 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 Knee without any gross swelling redness or tenderness Will check sed rate and CRP Dr. Georgina reported being consulted for further evaluation Pain control in the interim Consider repeat imaging as appropriate Follow-up orthopedic consultation

## 2024-03-16 NOTE — Consult Note (Signed)
 Orthopedic Surgery Consult Note  Assessment: Patient is a 83 y.o. male who recently underwent left sided approach L2-4 XLIF and L2-4 PSIF and has had left knee and thigh pain since surgery.  Orthopedics was consulted to rule out septic arthritis or a left distal femur lesion as the etiology of his pain.  His pain is inconsistent with the distal femur lesion as the etiology since his pain is nonmechanical in nature.  He has the pain even when laying in bed.  He has thigh pain which would not be consistent with gouty arthritis or septic arthritis.  However, given the clinical concern and the severe pain he is having in the thigh and knee, aspirate was performed to rule out possible intraarticular cause.  About 1 cc of typical appearing synovial fluid was aspirated.  This will be sent to the lab for crystal, Gram stain, culture, cell count.  The #1 priority would be crystal since his exam and the aspirate fluid are not consistent with a septic arthritis especially one that would have been going on for 3 weeks now.  Recommend MRI with and without contrast of the left knee to evaluate the distal femur lesion.  Weightbearing as tolerated.  No operative intervention at this time.  Will follow the aspirate results.  ___________________________________________________________________________   Reason for consult: Rule out left knee septic arthritis  History:  Patient is a 83 y.o. male who underwent L2-4 XLIF with a left-sided approach and L2-4 PSIF on 02/25/2024.  He states that since surgery he has had left thigh and left knee pain.  He does not have any pain going past the knee.  He notes the pain with activity and at rest.  He has come to the emergency department multiple times because his pain is severe in nature.  He did not have this pain prior to surgery.  He has not had any recent UTI or upper respiratory infection symptoms.  He said he cannot sleep at night as a result of the pain.  He has no personal  history of cancer.  He has been told that there was a lesion seen on his CT scan of his knee and was evaluated outpatient by an orthopedic group who recommended a MRI.  He has been unable to obtain this MRI.  He has no right-sided leg pain.  Review of systems: General: denies fevers and chills, myalgias Neurologic: denies recent changes in vision, slurred speech Abdomen: denies nausea, vomiting, hematemesis Respiratory: denies cough, shortness of breath   Physical Exam:  General: no acute distress but patient visibly in pain and is writhing around in bed, appears stated age Neurologic: alert, answering questions appropriately, following commands Cardiovascular: regular rate, no cyanosis Respiratory: unlabored breathing on room air, symmetric chest rise Psychiatric: appropriate affect, normal cadence to speech  MSK:   -Left lower extremity  Able to tolerate passive range of motion from 0-90 without any increase in pain, no palpable effusion in the knee, no erythema seen around the knee, no wounds seen around the knee Fires hip flexors, quadriceps, hamstrings, tibialis anterior, gastrocnemius and soleus, extensor hallucis longus Plantarflexes and dorsiflexes toes Sensation intact to light touch in sural, saphenous, tibial, deep peroneal, and superficial peroneal nerve distributions Foot warm and well perfused  Imaging: CT of the left knee from 03/11/2024 was independently reviewed and interpreted, showing increased density in the posteromedial aspect of the distal femur.  No osteolysis seen around the lesion.  Minimal joint space narrowing, no other significant degenerative changes  seen.  No fracture or dislocation seen.   Left knee aspiration note: After discussing the risks, benefits, alternatives of left knee aspiration, patient elected to proceed.  The patient was in the supine position.  The soft spot superior lateral to the patella was prepped with an alcohol-based prep.  Ethyl  chloride was used to anesthetize the skin.  A 20-gauge needle was used to enter the intra-articular space of the left knee under standard sterile technique.  Aspiration was done but was difficulty obtaining much fluid.  About 1 cc of normal-appearing synovial fluid was obtained.  Needle was withdrawn and Band-Aid was applied.  Patient tolerated the procedure well.   Patient name: Mason [name] Patient MRN: [id number] Date: 03/16/24

## 2024-03-17 ENCOUNTER — Other Ambulatory Visit: Payer: Self-pay

## 2024-03-17 DIAGNOSIS — Z981 Arthrodesis status: Secondary | ICD-10-CM | POA: Diagnosis not present

## 2024-03-17 DIAGNOSIS — Z7984 Long term (current) use of oral hypoglycemic drugs: Secondary | ICD-10-CM | POA: Diagnosis not present

## 2024-03-17 DIAGNOSIS — Z794 Long term (current) use of insulin: Secondary | ICD-10-CM | POA: Diagnosis not present

## 2024-03-17 DIAGNOSIS — I5042 Chronic combined systolic (congestive) and diastolic (congestive) heart failure: Secondary | ICD-10-CM | POA: Diagnosis present

## 2024-03-17 DIAGNOSIS — I4821 Permanent atrial fibrillation: Secondary | ICD-10-CM | POA: Diagnosis present

## 2024-03-17 DIAGNOSIS — R52 Pain, unspecified: Secondary | ICD-10-CM | POA: Diagnosis present

## 2024-03-17 DIAGNOSIS — Z96612 Presence of left artificial shoulder joint: Secondary | ICD-10-CM | POA: Diagnosis present

## 2024-03-17 DIAGNOSIS — I442 Atrioventricular block, complete: Secondary | ICD-10-CM | POA: Diagnosis present

## 2024-03-17 DIAGNOSIS — E785 Hyperlipidemia, unspecified: Secondary | ICD-10-CM | POA: Diagnosis present

## 2024-03-17 DIAGNOSIS — M109 Gout, unspecified: Secondary | ICD-10-CM | POA: Diagnosis present

## 2024-03-17 DIAGNOSIS — Z85828 Personal history of other malignant neoplasm of skin: Secondary | ICD-10-CM | POA: Diagnosis not present

## 2024-03-17 DIAGNOSIS — M11262 Other chondrocalcinosis, left knee: Secondary | ICD-10-CM | POA: Diagnosis present

## 2024-03-17 DIAGNOSIS — N1831 Chronic kidney disease, stage 3a: Secondary | ICD-10-CM

## 2024-03-17 DIAGNOSIS — Z8616 Personal history of COVID-19: Secondary | ICD-10-CM | POA: Diagnosis not present

## 2024-03-17 DIAGNOSIS — Z8249 Family history of ischemic heart disease and other diseases of the circulatory system: Secondary | ICD-10-CM | POA: Diagnosis not present

## 2024-03-17 DIAGNOSIS — Z7901 Long term (current) use of anticoagulants: Secondary | ICD-10-CM | POA: Diagnosis not present

## 2024-03-17 DIAGNOSIS — M545 Low back pain, unspecified: Secondary | ICD-10-CM | POA: Diagnosis present

## 2024-03-17 DIAGNOSIS — E1122 Type 2 diabetes mellitus with diabetic chronic kidney disease: Secondary | ICD-10-CM | POA: Diagnosis present

## 2024-03-17 DIAGNOSIS — M25562 Pain in left knee: Secondary | ICD-10-CM | POA: Diagnosis not present

## 2024-03-17 DIAGNOSIS — I10 Essential (primary) hypertension: Secondary | ICD-10-CM

## 2024-03-17 DIAGNOSIS — I495 Sick sinus syndrome: Secondary | ICD-10-CM | POA: Diagnosis present

## 2024-03-17 DIAGNOSIS — M25569 Pain in unspecified knee: Secondary | ICD-10-CM | POA: Diagnosis present

## 2024-03-17 DIAGNOSIS — Z823 Family history of stroke: Secondary | ICD-10-CM | POA: Diagnosis not present

## 2024-03-17 DIAGNOSIS — F32A Depression, unspecified: Secondary | ICD-10-CM | POA: Diagnosis present

## 2024-03-17 DIAGNOSIS — E1165 Type 2 diabetes mellitus with hyperglycemia: Secondary | ICD-10-CM

## 2024-03-17 DIAGNOSIS — I130 Hypertensive heart and chronic kidney disease with heart failure and stage 1 through stage 4 chronic kidney disease, or unspecified chronic kidney disease: Secondary | ICD-10-CM | POA: Diagnosis present

## 2024-03-17 DIAGNOSIS — E1151 Type 2 diabetes mellitus with diabetic peripheral angiopathy without gangrene: Secondary | ICD-10-CM | POA: Diagnosis present

## 2024-03-17 DIAGNOSIS — N183 Chronic kidney disease, stage 3 unspecified: Secondary | ICD-10-CM | POA: Diagnosis present

## 2024-03-17 DIAGNOSIS — K219 Gastro-esophageal reflux disease without esophagitis: Secondary | ICD-10-CM | POA: Diagnosis present

## 2024-03-17 DIAGNOSIS — Z95810 Presence of automatic (implantable) cardiac defibrillator: Secondary | ICD-10-CM | POA: Diagnosis not present

## 2024-03-17 LAB — CBC
HCT: 46.8 % (ref 39.0–52.0)
Hemoglobin: 15.8 g/dL (ref 13.0–17.0)
MCH: 29.9 pg (ref 26.0–34.0)
MCHC: 33.8 g/dL (ref 30.0–36.0)
MCV: 88.6 fL (ref 80.0–100.0)
Platelets: 157 K/uL (ref 150–400)
RBC: 5.28 MIL/uL (ref 4.22–5.81)
RDW: 12.7 % (ref 11.5–15.5)
WBC: 5.7 K/uL (ref 4.0–10.5)
nRBC: 0 % (ref 0.0–0.2)

## 2024-03-17 LAB — COMPREHENSIVE METABOLIC PANEL WITH GFR
ALT: 21 U/L (ref 0–44)
AST: 17 U/L (ref 15–41)
Albumin: 3 g/dL — ABNORMAL LOW (ref 3.5–5.0)
Alkaline Phosphatase: 142 U/L — ABNORMAL HIGH (ref 38–126)
Anion gap: 6 (ref 5–15)
BUN: 22 mg/dL (ref 8–23)
CO2: 26 mmol/L (ref 22–32)
Calcium: 8.9 mg/dL (ref 8.9–10.3)
Chloride: 104 mmol/L (ref 98–111)
Creatinine, Ser: 1.51 mg/dL — ABNORMAL HIGH (ref 0.61–1.24)
GFR, Estimated: 46 mL/min — ABNORMAL LOW (ref >60)
Glucose, Bld: 114 mg/dL — ABNORMAL HIGH (ref 70–99)
Potassium: 4.4 mmol/L (ref 3.5–5.1)
Sodium: 136 mmol/L (ref 135–145)
Total Bilirubin: 0.7 mg/dL (ref 0.0–1.2)
Total Protein: 6.3 g/dL — ABNORMAL LOW (ref 6.5–8.1)

## 2024-03-17 LAB — GLUCOSE, CAPILLARY
Glucose-Capillary: 123 mg/dL — ABNORMAL HIGH (ref 70–99)
Glucose-Capillary: 165 mg/dL — ABNORMAL HIGH (ref 70–99)
Glucose-Capillary: 202 mg/dL — ABNORMAL HIGH (ref 70–99)
Glucose-Capillary: 204 mg/dL — ABNORMAL HIGH (ref 70–99)

## 2024-03-17 MED ORDER — POLYETHYLENE GLYCOL 3350 17 G PO PACK
17.0000 g | PACK | Freq: Every day | ORAL | Status: DC
  Administered 2024-03-17 – 2024-03-18 (×2): 17 g via ORAL
  Filled 2024-03-17 (×2): qty 1

## 2024-03-17 MED ORDER — SENNOSIDES-DOCUSATE SODIUM 8.6-50 MG PO TABS
2.0000 | ORAL_TABLET | Freq: Every day | ORAL | Status: DC
  Administered 2024-03-17: 2 via ORAL
  Filled 2024-03-17: qty 2

## 2024-03-17 MED ORDER — GABAPENTIN 300 MG PO CAPS
600.0000 mg | ORAL_CAPSULE | Freq: Two times a day (BID) | ORAL | Status: DC
  Administered 2024-03-17 – 2024-03-18 (×2): 600 mg via ORAL
  Filled 2024-03-17 (×2): qty 2

## 2024-03-17 MED ORDER — RIVAROXABAN 15 MG PO TABS
15.0000 mg | ORAL_TABLET | Freq: Every day | ORAL | Status: DC
  Administered 2024-03-17: 15 mg via ORAL
  Filled 2024-03-17 (×2): qty 1

## 2024-03-17 MED ORDER — METHOCARBAMOL 500 MG PO TABS: 500.0000 mg | ORAL_TABLET | Freq: Four times a day (QID) | ORAL | Status: DC | PRN

## 2024-03-17 MED ORDER — DOCUSATE SODIUM 100 MG PO CAPS
100.0000 mg | ORAL_CAPSULE | Freq: Two times a day (BID) | ORAL | Status: DC
  Administered 2024-03-17: 100 mg via ORAL
  Filled 2024-03-17: qty 1

## 2024-03-17 NOTE — TOC CM/SW Note (Signed)
 Transition of Care [facility]) - Inpatient Brief Assessment   Patient Details  Name: TIPTON BALLOW MRN: [id number] Date of Birth: 02/17/1941  [facility] CM/SW Contact:    Lauraine FORBES Saa, LCSWA Phone Number: 03/17/2024, 2:14 PM   Clinical Narrative:  2:14 PM Per chart review, patient resides at home with spouse. Patient has a PCP and insurance. Patient does not have SNF or HH history. Patient has DME (BSC, rolling walker) history. Patient's preferred pharmacy's are [facility] [address], [facility] [place], and [facility], [place]. TOC consult was placed for HH/DME needs. Therapy recommended patient discharge home with [facility]. TOC will continue to follow and be available to assist.  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

## 2024-03-17 NOTE — Progress Notes (Signed)
  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   He is NOT currently a candidate for MRI under [facility]'s current protocols.   Ozell Jodie Passey, PA-C  03/17/2024 1:54 PM

## 2024-03-17 NOTE — Progress Notes (Signed)
 Orthopedic Progress Note  I saw patient this morning.  He was laying in bed.  He is still having left thigh and knee pain.  His aspirate results came back and showed no crystals.  His CRP was normal which is not consistent with septic arthritis.  His exam is also not consistent with septic arthritis.  He has range of motion from 0-90 without increased pain.  He has no effusion either. EHL/TA/GSC intact. Still recommending MRI with and without contrast of the left knee.  However, it is unlikely that that is the etiology of his pain since tumor pain is usually more mechanical in nature and does not come on suddenly like his pain did.  Tends to be more of a gradual onset.  Will continue to follow.  Weightbearing as tolerated bilateral lower extremities.  No operative plans at this time.   Mason [name] Ada, MD Orthopedic Surgeon

## 2024-03-17 NOTE — Plan of Care (Signed)

## 2024-03-17 NOTE — Progress Notes (Signed)
 Progress Note   Patient: Mason [name] FMW:[id number] DOB: Dec 24, 1940 DOA: 03/16/2024     0 DOS: the patient was seen and examined on 03/17/2024   Brief hospital course: NAZAIAH [name] is a 83 y.o. male with medical history significant of heart block s/p pacemaker, chronic HFrEF, stage 3 CKD, Afib, HLD, HTN presenting with back pain and knee pain. Pt noted to have recently admitted for  8/4-->8/6 with NSG for Degenerative spondylolisthesis. Had L sided L2-L4 RP interbody decompression and fusion. Per the patient and daughter, pt has had significant L sided knee pain and low back pain since the surgery.   Assessment and Plan: Left knee pain August 19 CT of the knee imaging showing  Extensive chondrocalcinosis along the popliteus tendon proximally and to a lesser extent of the distal semimembranosus tendon, articular joint space narrowing in the medial compartment, Sclerotic lesion with localized trabecular accentuation posteromedially in the distal femoral metadiaphysis   Differential diagnosis includes infectious, inflammatory, degenerative  etiologies Symptomatology not consistent with radiculopathy in setting of most recent L2-L4 procedure. Knee without any gross swelling redness or tenderness sed rate and CRP normal not consistent with septic arthritis. Dr. Georgina - no effusion, unlikely gout, septic knee, advised MRI which cannot be done due to pacemaker. Outpatient ortho follow up for repeat imaging. Continue pain control with opiates, PT/ OT.  Low back pain Recurrent low back pain with recent uncomplicated left-sided L2-3 and L3-4 retroperitoneal interbody decompression and fusion with posterior percutaneous fixation. Family denies any recent falls or trauma. Noted prior issues of radicular symptoms in the past, this may be confounding issue in the setting left-sided knee pain Neurovascularly intact distally. Noted regressing L3/L4 seroma on imaging today. Neurosurgery on board, no findings  concerning for post operative infection at this time  Follow-up recommendations Pain control/ PT/ OT.  CKD (chronic kidney disease), stage III (HCC) Near baseline Monitor renal function  CHF (congestive heart failure) (HCC) 2D echo December 2022 with EF of 50-55% Appears euvolemic  HTN (hypertension), benign BP stable Titrate home regimen  Hyperlipidemia Continue statin  Atrial fibrillation, permanent Rate controlled at present. decreased Xarelto  per pharmacy protocol with age, creatinine.  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 HR stable  Continue telemetry     Out of bed to chair. Incentive spirometry. Nursing supportive care. Fall, aspiration precautions. Diet:  Diet Orders (From admission, onward)     Start     Ordered   03/16/24 1408  Diet heart healthy/carb modified Room service appropriate? Yes; Fluid consistency: Thin  Diet effective now       Question Answer Comment  Diet-HS Snack? Nothing   Room service appropriate? Yes   Fluid consistency: Thin      03/16/24 1408           DVT prophylaxis:  Rivaroxaban  (XARELTO ) tablet 15 mg  Level of care: Med-Surg   Code Status: Full Code  Subjective: Patient is seen and examined today morning. He is lying in bed, denies any complaints.   Physical Exam: Vitals:   03/16/24 2240 03/17/24 0040 03/17/24 0645 03/17/24 0804  BP: 90/64 106/67  90/70  Pulse: 72 70  71  Resp:    20  Temp: 98.2 F (36.8 C)   97.9 F (36.6 C)  TempSrc: Oral     SpO2: 97%   100%  Weight:   90.7  kg   Height:   5' 11 (1.803 m)     General - Elderly Caucasian male, sitting, no apparent distress HEENT - PERRLA, EOMI, atraumatic head, non tender sinuses. Lung - Clear, basal rales, no rhonchi, wheezes. Heart - S1, S2 heard, no murmurs, rubs, trace pedal edema. Abdomen - Soft, non tender, bowel  sounds good Neuro - Alert, awake and oriented x 3, non focal exam. Skin - Warm and dry.  Data Reviewed:      Latest Ref Rng & Units 03/17/2024    5:07 AM 03/16/2024    3:00 AM 03/11/2024    8:33 AM  CBC  WBC 4.0 - 10.5 K/uL 5.7  8.4  9.7   Hemoglobin 13.0 - 17.0 g/dL 84.1  83.7  84.0   Hematocrit 39.0 - 52.0 % 46.8  48.1  48.3   Platelets 150 - 400 K/uL 157  190  191       Latest Ref Rng & Units 03/17/2024    5:07 AM 03/16/2024    3:00 AM 03/11/2024    8:33 AM  BMP  Glucose 70 - 99 mg/dL 885  858  834   BUN 8 - 23 mg/dL 22  23  37   Creatinine 0.61 - 1.24 mg/dL 8.48  8.61  8.51   Sodium 135 - 145 mmol/L 136  136  135   Potassium 3.5 - 5.1 mmol/L 4.4  3.9  4.6   Chloride 98 - 111 mmol/L 104  102  101   CO2 22 - 32 mmol/L 26  25  24    Calcium  8.9 - 10.3 mg/dL 8.9  9.1  9.3    CT L-SPINE NO CHARGE Result Date: 03/16/2024 CLINICAL DATA:  83 year old male with abdomen and flank pain. Status post spine surgery on 02/25/2024. EXAM: CT LUMBAR SPINE WITH CONTRAST TECHNIQUE: Technique: Multiplanar CT images of the lumbar spine were reconstructed from contemporary CT of the Abdomen and Pelvis. RADIATION DOSE REDUCTION: This exam was performed according to the departmental dose-optimization program which includes automated exposure control, adjustment of the mA and/or kV according to patient size and/or use of iterative reconstruction technique. CONTRAST:  No additional COMPARISON:  CT Abdomen and Pelvis today reported separately. Recent lumbar spine CT 03/03/2024. lumbar MRI 03/05/2024. FINDINGS: Segmentation: Normal, the same numbering system used on CTs and MRIs this month. Alignment: Stable. Dextroconvex upper and mild levoconvex lower lumbar scoliosis. Stable lordosis. Vertebrae: Hyperostosis related lower thoracic interbody ankylosis through the L1-L2 disc space. Superimposed postoperative changes L2 through L4. Left-side side unilateral pedicle screws and interbody implants. Hardware appears  stable. See additional postoperative details below. Sacrum and SI joints appear stable and intact. No acute osseous abnormality identified. Paraspinal and other soft tissues: Abdomen and pelvis are reported separately. Of note, small volume gas in the medial right psoas muscle lateral to the postoperative L3-L4 disc space (series 4, image 69 and coronal image 24) appears related to the site of a postoperative seroma on the MRI this month, which seems regressed since that time. And compared to 03/03/2024, there is no progressed paraspinal soft tissue inflammation by CT. Disc levels: Interbody ankylosis from flowing endplate osteophytes through the visible lower thoracic spine and L1-L2 (coronal image 34). L2-L3: Stable fusion hardware. Stable residual disc osteophyte complex. No new or progressive features. L3-L4: Stable fusion hardware. Stable residual disc osteophyte complex. No new or progressive features. L4-L5: Chronic advanced disc and endplate degeneration including vacuum disc and disc osteophyte complex asymmetric to the right. This level  appears stable. L5-S1: Less advanced chronic disc, endplate, moderate facet degeneration appears stable. IMPRESSION: 1. Stable postoperative CT appearance of the Lumbar spine since 03/03/2024, concordant with 03/05/2024 MRI. No complicating features are identified. 2. Small focus of gas in the right psoas muscle lateral to the postoperative disc space at L3-L4 is probably the site of regressing postoperative seroma. No acute paraspinal soft tissue inflammation by CT. 3. CT Abdomen and Pelvis today reported separately. Electronically Signed   By: VEAR Hurst M.D.   On: 03/16/2024 07:43   CT ABDOMEN PELVIS W CONTRAST Result Date: 03/16/2024 CLINICAL DATA:  Abdominal flank pain. EXAM: CT ABDOMEN AND PELVIS WITH CONTRAST TECHNIQUE: Multidetector CT imaging of the abdomen and pelvis was performed using the standard protocol following bolus administration of intravenous contrast.  RADIATION DOSE REDUCTION: This exam was performed according to the departmental dose-optimization program which includes automated exposure control, adjustment of the mA and/or kV according to patient size and/or use of iterative reconstruction technique. CONTRAST:  75mL OMNIPAQUE  IOHEXOL  350 MG/ML SOLN COMPARISON:  03/03/2024 FINDINGS: Lower chest: Calcified granuloma noted posterior left lower lobe. Hepatobiliary: No suspicious focal abnormality within the liver parenchyma. There is no evidence for gallstones, gallbladder wall thickening, or pericholecystic fluid. No intrahepatic or extrahepatic biliary dilation. Pancreas: No focal mass lesion. No dilatation of the main duct. No intraparenchymal cyst. No peripancreatic edema. Spleen: No splenomegaly. No suspicious focal mass lesion. Adrenals/Urinary Tract: No adrenal nodule or mass. Kidneys unremarkable. No evidence for hydroureter. Bladder is moderately distended. Stomach/Bowel: Stomach is unremarkable. No gastric wall thickening. No evidence of outlet obstruction. Duodenum is normally positioned as is the ligament of Treitz. No small bowel wall thickening. No small bowel dilatation. The terminal ileum is normal. The appendix is normal. No gross colonic mass. No colonic wall thickening. Vascular/Lymphatic: There is mild atherosclerotic calcification of the abdominal aorta without aneurysm. There is no gastrohepatic or hepatoduodenal ligament lymphadenopathy. No retroperitoneal or mesenteric lymphadenopathy. No pelvic sidewall lymphadenopathy. Reproductive: Prostate gland is enlarged. Other: No intraperitoneal free fluid. Tiny extraperitoneal gas bubbles are seen in the left retroperitoneal space (51/3) and in the left inguinal canal (85/3). This is substantially decreased in volume compared to the prior study from 03/03/2024. Previous study also had gas in the left abdominal wall which has resolved completely in the interval. Subcutaneous stranding with  calcification in the left flank region is similar to prior. Musculoskeletal: Tiny gas bubble in the right psoas muscle is more prominent than on the previous study. There is high density material in this region of the psoas muscle, new in the interval in potentially an area of new mineralization. Patient is status post discectomy and posterior fusion at L2-3 and L3-4. Please see dedicated lumbar spine CT for more definitive characterization. IMPRESSION: 1. Tiny extraperitoneal gas bubbles in the left retroperitoneal space and left inguinal canal. This is substantially decreased in volume compared to the prior study from 03/03/2024. While residual soft tissue gas is not typically expected more than 7-10 days from surgery, decreasing gas volume suggests that this is resolving postoperative change. Previous study also had gas in the left abdominal wall which has resolved completely in the interval. 2. Tiny gas bubble in the right psoas muscle is more prominent than on the previous study. There is high density material in this region of the psoas muscle, new in the interval and potentially an area of new mineralization. Given the patient's history of persistent pain, correlation for psoas abscess suggested. Please see report for dedicated lumbar  spine CT dictated separately. 3. Subcutaneous stranding with calcification in the left flank region is similar to prior, likely scarring. 4.  Aortic Atherosclerosis (ICD10-I70.0). Electronically Signed   By: Camellia Candle M.D.   On: 03/16/2024 07:25    Family Communication: Discussed with patient, understand and agree. All questions answered.  Disposition: Status is: Inpatient Remains inpatient appropriate because: pain control, PT/ OT  Planned Discharge Destination: Home with Home Health     Time spent: 44 minutes  Author: Concepcion Riser, MD 03/17/2024 2:06 PM Secure chat 7am to 7pm For on call review [email or web address].

## 2024-03-17 NOTE — Discharge Instructions (Signed)

## 2024-03-17 NOTE — Evaluation (Signed)
 Occupational Therapy Evaluation Patient Details Name: Mason [name] MRN: [id number] DOB: February 23, 1941 Today's Date: 03/17/2024   History of Present Illness   Pt is an 83 y.o. male presenting 8/19 with L sided knee and low back pain. Recent 2-3 and L3-4 decompression and fusion on 8/4. PMH includes: AICD, aortic aneurysm, CHF, complete heart block, afib, vertigo, NICM, arthritis, PVD, and DM II.     Clinical Impressions PTA, pt living with wife who has been assisting with donning socks since surgery. Upon eval, pt performing UB ADL with mod I and LB ADL with CGA-min cues for implementation of spinal precautions. Pt with poor recall and functional implementation of precautions during ADL and mobility requiring min-mod cues. Pt educated and demonstrating use of compensatory techniques for bed mobility, LB ADL, grooming within precautions this session. Pt to continue to benefit from acute OT services. Recommending discharge home with HHOT.        If plan is discharge home, recommend the following:   Assist for transportation;A little help with bathing/dressing/bathroom     Functional Status Assessment   Patient has had a recent decline in their functional status and demonstrates the ability to make significant improvements in function in a reasonable and predictable amount of time.     Equipment Recommendations   None recommended by OT     Recommendations for Other Services         Precautions/Restrictions   Precautions Precautions: Back Precaution Booklet Issued: No Recall of Precautions/Restrictions: Impaired Precaution/Restrictions Comments: able to recall 1/3 precautions with increased time. reminded of BLT precautions and pt able to recall 2/3 with min cues at end of session Required Braces or Orthoses: Spinal Brace Spinal Brace: Lumbar corset;Applied in sitting position (per pt, Dr. Malcolm told him he does not have to wear lumbar corsett if he is inside his  home) Restrictions Weight Bearing Restrictions Per Provider Order: No     Mobility Bed Mobility Overal bed mobility: Needs Assistance Bed Mobility: Rolling, Sidelying to Sit Rolling: Supervision Sidelying to sit: Supervision       General bed mobility comments: cues for technique. pt using bed rails    Transfers Overall transfer level: Needs assistance Equipment used: Rolling walker (2 wheels) Transfers: Sit to/from Stand Sit to Stand: Contact guard assist, Supervision           General transfer comment: cues for hand placement and posture once standing      Balance Overall balance assessment: Needs assistance Sitting-balance support: Feet supported Sitting balance-Leahy Scale: Good     Standing balance support: Reliant on assistive device for balance                               ADL either performed or assessed with clinical judgement   ADL Overall ADL's : Needs assistance/impaired Eating/Feeding: Independent   Grooming: Contact guard assist;Standing   Upper Body Bathing: Set up;Sitting   Lower Body Bathing: Contact guard assist;Sit to/from stand   Upper Body Dressing : Set up;Sitting   Lower Body Dressing: Contact guard assist;Cueing for compensatory techniques;Sit to/from stand   Toilet Transfer: Supervision/safety;Regular Toilet;Rolling walker (2 wheels);Ambulation           Functional mobility during ADLs: Supervision/safety;Rolling walker (2 wheels)       Vision Baseline Vision/History: 1 Wears glasses Patient Visual Report: No change from baseline       Perception Perception: Not tested  Praxis Praxis: Not tested       Pertinent Vitals/Pain Pain Assessment Pain Assessment: Faces Faces Pain Scale: Hurts little more Pain Location: back, L knee Pain Descriptors / Indicators: Guarding, Grimacing Pain Intervention(s): Limited activity within patient's tolerance, Monitored during session     Extremity/Trunk  Assessment Upper Extremity Assessment Upper Extremity Assessment: Overall WFL for tasks assessed   Lower Extremity Assessment Lower Extremity Assessment: Defer to PT evaluation   Cervical / Trunk Assessment Cervical / Trunk Assessment: Back Surgery   Communication Communication Communication: No apparent difficulties   Cognition Arousal: Alert Behavior During Therapy: WFL for tasks assessed/performed Cognition: Cognition impaired       Memory impairment (select all impairments): Short-term memory     OT - Cognition Comments: decr recall and needs cues to implement spinal precautions                 Following commands: Intact       Cueing  General Comments   Cueing Techniques: Verbal cues      Exercises     Shoulder Instructions      Home Living Family/patient expects to be discharged to:: Private residence Living Arrangements: Spouse/significant other Available Help at Discharge: Family;Available 24 hours/day (although this is wife who has her own medical issues. Wife does typically assist with socks,) Type of Home: House Home Access: Stairs to enter [employer] of Steps: 3 Entrance Stairs-Rails: Right;Left;Can reach both Home Layout: One level     Bathroom Shower/Tub: Producer, television/film/video: Standard     Home Equipment: Agricultural consultant (2 wheels)          Prior Functioning/Environment Prior Level of Function : Independent/Modified Independent;Driving             Mobility Comments: RW since surgery. pt reports max pain L side of back and LLE hip to knee. ADLs Comments: independent; wife recently assisting with socks,    OT Problem List: Pain   OT Treatment/Interventions: Self-care/ADL training;Therapeutic exercise;DME and/or AE instruction;Therapeutic activities;Patient/family education;Balance training      OT Goals(Current goals can be found in the care plan section)   Acute Rehab OT Goals Patient Stated  Goal: get better OT Goal Formulation: With patient Time For Goal Achievement: 03/31/24 Potential to Achieve Goals: Good   OT Frequency:  Min 2X/week    Co-evaluation              AM-PAC OT 6 Clicks Daily Activity     Outcome Measure Help from another person eating meals?: None Help from another person taking care of personal grooming?: A Little Help from another person toileting, which includes using toliet, bedpan, or urinal?: A Little Help from another person bathing (including washing, rinsing, drying)?: A Little Help from another person to put on and taking off regular upper body clothing?: None Help from another person to put on and taking off regular lower body clothing?: A Little 6 Click Score: 20   End of Session Equipment Utilized During Treatment: Rolling walker (2 wheels) Nurse Communication: Mobility status  Activity Tolerance: Patient tolerated treatment well Patient left: in bed;with call bell/phone within reach  OT Visit Diagnosis: Unsteadiness on feet (R26.81)                Time: 9173-9142 OT Time Calculation (min): 31 min Charges:  OT General Charges $OT Visit: 1 Visit OT Evaluation $OT Eval Moderate Complexity: 1 Mod OT Treatments $Self Care/Home Management : 8-22 mins  Elma JONETTA Penner, OTD,  OTR/L [facility] Acute Rehabilitation Office: [phone]   Elma JONETTA Penner 03/17/2024, 9:27 AM

## 2024-03-17 NOTE — Care Management Obs Status (Signed)
 MEDICARE OBSERVATION STATUS NOTIFICATION   Patient Details  Name: Mason [name] MRN: [id number] Date of Birth: Feb 16, 1941   Medicare Observation Status Notification Given:  Yes verbally reviewed observation notice with Nancyann Baptist telephonically at [phone]. A copy will be provided to patient in the room    [facility] 03/17/2024, 10:49 AM

## 2024-03-17 NOTE — Evaluation (Addendum)
 Physical Therapy Evaluation Patient Details Name: Mason [name] MRN: [id number] DOB: 28-Sep-1940 Today's Date: 03/17/2024  History of Present Illness  Pt is an 83 y/o M admitted on 03/16/24 after presenting with back & L knee pain. Pt with recent admission 02/25/24-02/27/24 for L2-L4 RP interbody decompression & fusion. Pt with hx of abnormal CT of L knee. Ortho consulted & recommended knee MRI. PMH: heart block s/p pacemaker, chronic HFrEF, CKD 3, a-fib, HLD, HTN  Clinical Impression  Pt seen for PT evaluation with pt agreeable, wife & granddaughter present for session. Pt appears to have some cognitive deficits but unsure if this is baseline. Per family, pt has limited OOB mobility since sx 2/2 L knee pain.  On this date, pt is able to complete bed mobility with supervision, bed rails, & HOB elevated. Pt ambulates into hallway with RW & CGA without overt LOB. Pt reports dizziness at home with prolonged sitting but none noted on this date; pt left sitting EOB but granddaughter reporting she'll have pt lay down in a few minutes. Also asked family to bring in LSO. Will continue to follow pt acutely to progress mobility as able.      If plan is discharge home, recommend the following: A little help with walking and/or transfers;Assist for transportation;Help with stairs or ramp for entrance;Direct supervision/assist for medications management;Direct supervision/assist for financial management;Assistance with cooking/housework;A little help with bathing/dressing/bathroom;Supervision due to cognitive status   Can travel by private vehicle        Equipment Recommendations None recommended by PT  Recommendations for Other Services       Functional Status Assessment Patient has had a recent decline in their functional status and demonstrates the ability to make significant improvements in function in a reasonable and predictable amount of time.     Precautions / Restrictions Precautions Precautions:  Back Recall of Precautions/Restrictions: Impaired Required Braces or Orthoses: Spinal Brace Spinal Brace: Lumbar corset;Applied in sitting position (per pt (Dr. Louis confirmed), pt does not have to wear brace inside his home)      Mobility  Bed Mobility Overal bed mobility: Needs Assistance Bed Mobility: Rolling, Sidelying to Sit, Sit to Sidelying Rolling: Supervision, Used rails Sidelying to sit: Supervision, HOB elevated, Used rails     Sit to sidelying: Supervision, HOB elevated, Used rails General bed mobility comments: min cuing for log rolling    Transfers Overall transfer level: Needs assistance Equipment used: Rolling walker (2 wheels) Transfers: Sit to/from Stand Sit to Stand: Supervision                Ambulation/Gait Ambulation/Gait assistance: Supervision, Contact guard assist Gait Distance (Feet): 40 Feet Assistive device: Rolling walker (2 wheels) Gait Pattern/deviations: Decreased step length - left, Decreased stride length, Decreased step length - right Gait velocity: decreased     General Gait Details: no overt LOB  Stairs            Wheelchair Mobility     Tilt Bed    Modified Rankin (Stroke Patients Only)       Balance Overall balance assessment: Needs assistance Sitting-balance support: Feet supported Sitting balance-Leahy Scale: Good     Standing balance support: Reliant on assistive device for balance, Bilateral upper extremity supported, During functional activity Standing balance-Leahy Scale: Good                               Pertinent Vitals/Pain Pain Assessment Pain Assessment: 0-10  Pain Score: 5  Pain Location: L knee Pain Descriptors / Indicators: Discomfort Pain Intervention(s): Monitored during session, Limited activity within patient's tolerance    Home Living Family/patient expects to be discharged to:: Private residence Living Arrangements: Spouse/significant other Available Help at  Discharge: Family;Available 24 hours/day (wife but she's currently dealing with her own medical issues, granddaughter PRN) Type of Home: House Home Access: Stairs to enter Entrance Stairs-Rails: Right;Left;Can reach both Entrance Stairs-Number of Steps: 3   Home Layout: One level Home Equipment: Agricultural consultant (2 wheels);Cane - quad;Cane - single point      Prior Function Prior Level of Function : Independent/Modified Independent;Driving             Mobility Comments: limited OOB mobility since sx 2/2 L knee pain, ambulatory with RW since sx ADLs Comments: pt & wife both manage meds     Extremity/Trunk Assessment   Upper Extremity Assessment Upper Extremity Assessment: Overall WFL for tasks assessed    Lower Extremity Assessment Lower Extremity Assessment: Overall WFL for tasks assessed;RLE deficits/detail RLE Sensation: history of peripheral neuropathy LLE Deficits / Details: pt c/o tingling down anterior LLE but sensation to light touch intact & equal to RLE LLE Sensation: decreased proprioception;history of peripheral neuropathy (impaired proprioception in L 5th toe but otherwise intact)    Cervical / Trunk Assessment Cervical / Trunk Assessment: Back Surgery  Communication   Communication Communication: No apparent difficulties    Cognition Arousal: Alert Behavior During Therapy: WFL for tasks assessed/performed   PT - Cognitive impairments: Orientation, Awareness, Memory, Attention, Sequencing, Problem solving, Safety/Judgement                       PT - Cognition Comments: Pt oriented to date, month, requires extra time to recall current year, initially reports 2024. Recalls 3/3 back precautions with extra time.         Cueing Cueing Techniques: Verbal cues     General Comments      Exercises     Assessment/Plan    PT Assessment Patient needs continued PT services  PT Problem List Decreased strength;Decreased range of motion;Decreased  activity tolerance;Decreased balance;Decreased mobility;Decreased safety awareness;Pain;Decreased cognition;Decreased knowledge of use of DME;Decreased knowledge of precautions       PT Treatment Interventions DME instruction;Gait training;Stair training;Functional mobility training;Therapeutic activities;Therapeutic exercise;Balance training;Patient/family education;Neuromuscular re-education    PT Goals (Current goals can be found in the Care Plan section)  Acute Rehab PT Goals Patient Stated Goal: decreased pain PT Goal Formulation: With patient Time For Goal Achievement: 03/31/24 Potential to Achieve Goals: Good    Frequency Min 2X/week     Co-evaluation               AM-PAC PT 6 Clicks Mobility  Outcome Measure Help needed turning from your back to your side while in a flat bed without using bedrails?: None Help needed moving from lying on your back to sitting on the side of a flat bed without using bedrails?: A Little Help needed moving to and from a bed to a chair (including a wheelchair)?: A Little Help needed standing up from a chair using your arms (e.g., wheelchair or bedside chair)?: A Little Help needed to walk in hospital room?: A Little Help needed climbing 3-5 steps with a railing? : A Little 6 Click Score: 19    End of Session   Activity Tolerance: Patient tolerated treatment well Patient left: in bed;with call bell/phone within reach;with bed alarm set  PT Visit Diagnosis: Unsteadiness on feet (R26.81);Muscle weakness (generalized) (M62.81);Other abnormalities of gait and mobility (R26.89);Pain Pain - Right/Left: Left Pain - part of body: Knee    Time: 8881-8864 PT Time Calculation (min) (ACUTE ONLY): 17 min   Charges:   PT Evaluation $PT Eval Low Complexity: 1 Low   PT General Charges $$ ACUTE PT VISIT: 1 Visit         Richerd Pinal, PT, DPT 03/17/24, 11:52 AM   Richerd CHRISTELLA Pinal 03/17/2024, 11:52 AM

## 2024-03-17 NOTE — Progress Notes (Signed)
 Baseline scr ~1.5>>Crcl <50. Ok to adjust Xarelto  to 15mg  qday moving forward per Dr. Darci.  Sergio Batch, PharmD, BCIDP, AAHIVP, CPP Infectious Disease Pharmacist 03/17/2024 11:05 AM

## 2024-03-18 ENCOUNTER — Inpatient Hospital Stay: Admitting: Family Medicine

## 2024-03-18 DIAGNOSIS — I4821 Permanent atrial fibrillation: Secondary | ICD-10-CM | POA: Diagnosis not present

## 2024-03-18 DIAGNOSIS — I5042 Chronic combined systolic (congestive) and diastolic (congestive) heart failure: Secondary | ICD-10-CM

## 2024-03-18 DIAGNOSIS — M25562 Pain in left knee: Secondary | ICD-10-CM | POA: Diagnosis not present

## 2024-03-18 DIAGNOSIS — E1165 Type 2 diabetes mellitus with hyperglycemia: Secondary | ICD-10-CM | POA: Diagnosis not present

## 2024-03-18 DIAGNOSIS — N1831 Chronic kidney disease, stage 3a: Secondary | ICD-10-CM | POA: Diagnosis not present

## 2024-03-18 DIAGNOSIS — I442 Atrioventricular block, complete: Secondary | ICD-10-CM | POA: Diagnosis not present

## 2024-03-18 LAB — GLUCOSE, CAPILLARY
Glucose-Capillary: 166 mg/dL — ABNORMAL HIGH (ref 70–99)
Glucose-Capillary: 170 mg/dL — ABNORMAL HIGH (ref 70–99)

## 2024-03-18 MED ORDER — RIVAROXABAN 15 MG PO TABS: 15.0000 mg | ORAL_TABLET | Freq: Every day | ORAL | 0 refills | Status: DC

## 2024-03-18 MED ORDER — HYDROMORPHONE HCL 2 MG PO TABS: 1.0000 mg | ORAL_TABLET | Freq: Four times a day (QID) | ORAL | 0 refills | Status: DC | PRN

## 2024-03-18 NOTE — Progress Notes (Signed)
 Mobility Specialist Progress Note:   03/18/24 1057  Mobility  Activity Ambulated with assistance (In hallway)  Level of Assistance Standby assist, set-up cues, supervision of patient - no hands on  Assistive Device Front wheel walker  Distance Ambulated (ft) 265 ft (x3)  Activity Response Tolerated well  Mobility Referral Yes  Mobility visit 1 Mobility  Mobility Specialist Start Time (ACUTE ONLY) [id number]  Mobility Specialist Stop Time (ACUTE ONLY) 0947  Mobility Specialist Time Calculation (min) (ACUTE ONLY) 21 min   Received pt EOB and agreeable to mobility. No physical assistance needed. C/o LLE pain and back pain, otherwise tolerated well. Returned pt to room without fault and left in chair with personal belongings and call light within reach. All needs met.  Lavanda Pollack Mobility Specialist  Please contact via [employer] or  Rehab Office [phone]

## 2024-03-18 NOTE — Discharge Summary (Signed)
 Physician Discharge Summary   Patient: Mason [name] MRN: [id number] DOB: Oct 22, 1940  Admit date:     03/16/2024  Discharge date: 03/18/24  Discharge Physician: Concepcion Riser   PCP: Candise Aleene DEL, MD   Recommendations at discharge:  {Tip this will not be part of the note when signed- Example include specific recommendations for outpatient follow-up, pending tests to follow-up on. (Optional):26781}  PCP follow up in 1 week. Neurosurgery follow up as scheduled.  Discharge Diagnoses: Principal Problem:   Intractable pain Active Problems:   Left knee pain   Complete heart block (HCC)   T2DM (type 2 diabetes mellitus) (HCC)   Atrial fibrillation, permanent   Hyperlipidemia   HTN (hypertension), benign   CHF (congestive heart failure) (HCC)   CKD (chronic kidney disease), stage III (HCC)   Low back pain   Knee pain  Resolved Problems:   * No resolved hospital problems. *  Hospital Course: Mason [name] is a 83 y.o. male with medical history significant of heart block s/p pacemaker, chronic HFrEF, stage 3 CKD, Afib, HLD, HTN presenting with back pain and knee pain. Pt noted to have recently admitted for  8/4-->8/6 with NSG for Degenerative spondylolisthesis. Had L sided L2-L4 RP interbody decompression and fusion. Per the family he had significant L sided knee pain and low back pain since the surgery.  Orthopedic surgery evaluated him, advised MRI knee but not cleared due to his Pacemaker. Orthopedic outpatient follow up with repeat CT knee suggested. Neurosurgery did see him advised PT/ OT. He is hemodynamically stable to be discharged home with [facility] PT/ OT, DME arranged.    Assessment and Plan: Left knee pain August 19 CT of the knee imaging showing  Extensive chondrocalcinosis along the popliteus tendon proximally and to a lesser extent of the distal semimembranosus tendon, articular joint space narrowing in the medial compartment, Sclerotic lesion with localized trabecular  accentuation posteromedially in the distal femoral metadiaphysis   Differential diagnosis includes infectious, inflammatory, degenerative  etiologies Symptomatology not consistent with radiculopathy in setting of most recent L2-L4 procedure. Knee without any gross swelling redness or tenderness, sed rate and CRP normal not consistent with septic arthritis. Dr. Georgina from ortho- no effusion, unlikely gout, septic knee, advised MRI which cannot be done due to pacemaker. Outpatient ortho follow up for repeat imaging. Continue pain control with opiates, PT/ OT.   Low back pain Recurrent low back pain with recent uncomplicated left-sided L2-3 and L3-4 retroperitoneal interbody decompression and fusion with posterior percutaneous fixation. Family denies any recent falls or trauma. Noted prior issues of radicular symptoms in the past, this may be confounding issue in the setting left-sided knee pain Neurovascularly intact distally. Noted regressing L3/L4 seroma on imaging today. Neurosurgery on board, no findings concerning for post operative infection at this time  Follow-up Neurosurgery as outpatient. Scripts for pain meds given. HH PT/ OT arranged.   CKD (chronic kidney disease), stage III (HCC) Near baseline Monitor renal function   CHF (congestive heart failure) (HCC) 2D echo December 2022 with EF of 50-55% Appears euvolemic   HTN (hypertension), benign BP stable, continue home regimen   Hyperlipidemia Continue statin   Atrial fibrillation, permanent Rate controlled at present. decreased Xarelto  per pharmacy protocol with age, creatinine.   T2DM (type 2 diabetes mellitus) (HCC) Blood sugar 140s, A1C 6.8   Complete heart block (HCC) S/p pacemaker 12/2016  Paced rhythm on EKG. HR stable  Continue telemetry      {Tip this will not  be part of the note when signed Body mass index is 27.89 kg/m. , ,  (Optional):26781}  {(NOTE) Pain control PDMP Statment  (Optional):26782} Consultants: ortho, neurosurgery Procedures performed: none  Disposition: Home health Diet recommendation:  Discharge Diet Orders (From admission, onward)     Start     Ordered   03/18/24 0000  Diet - low sodium heart healthy        03/18/24 1222           Cardiac and Carb modified diet DISCHARGE MEDICATION: Allergies as of 03/18/2024   No Known Allergies      Medication List     STOP taking these medications    metoprolol  succinate 50 MG 24 hr tablet Commonly known as: TOPROL -XL       TAKE these medications    chlorhexidine  4 % external liquid Commonly known as: HIBICLENS  Apply 15 mLs (1 Application total) topically as directed for 30 doses. Use as directed daily for 5 days every other week for 6 weeks.   Combigan  0.2-0.5 % ophthalmic solution Generic drug: brimonidine -timolol  Place 1 drop into both eyes every 12 (twelve) hours.   Entresto  97-103 MG Generic drug: sacubitril -valsartan  TAKE 1 TABLET TWO TIMES A DAY   eplerenone  25 MG tablet Commonly known as: INSPRA  TAKE 1 TABLET DAILY   gabapentin  300 MG capsule Commonly known as: NEURONTIN  TAKE 2 CAPSULES TWICE A DAY   HYDROmorphone  2 MG tablet Commonly known as: Dilaudid  Take 0.5 tablets (1 mg total) by mouth every 6 (six) hours as needed for severe pain (pain score 7-10).   Jardiance  10 MG Tabs tablet Generic drug: empagliflozin  TAKE 1 TABLET DAILY   Lantus  SoloStar 100 UNIT/ML Solostar Pen Generic drug: insulin  glargine Inject 30-34 Units into the skin daily.   latanoprost  0.005 % ophthalmic solution Commonly known as: XALATAN  Place 1 drop into both eyes at bedtime.   lidocaine  5 % Commonly known as: LIDODERM  PLACE 1 PATCH ON THE SKIN DAILY. REMOVE AND DISCARD PATCH WITHIN 12 HOURS OR AS DIRECTED BY DOCTOR   meclizine  25 MG tablet Commonly known as: ANTIVERT  TAKE 1 TABLET THREE TIMES A DAY AS NEEDED FOR DIZZINESS Notes to patient: This medication helps treat vertigo/  dizziness   methocarbamol  500 MG tablet Commonly known as: ROBAXIN  Take 2 tablets (1,000 mg total) by mouth every 6 (six) hours as needed for muscle spasms. What changed: Another medication with the same name was removed. Continue taking this medication, and follow the directions you see here.   mupirocin  ointment 2 % Commonly known as: BACTROBAN  Place 1 Application into the nose 2 (two) times daily for 60 doses. Use as directed 2 times daily for 5 days every other week for 6 weeks.   NovoLOG  FlexPen 100 UNIT/ML FlexPen Generic drug: insulin  aspart Inject 10 units before each meal   omeprazole  40 MG capsule Commonly known as: PRILOSEC Take 40 mg by mouth daily. Notes to patient: This medication helps with acid reflux/ GERD/ indigestion.    oxybutynin  10 MG 24 hr tablet Commonly known as: DITROPAN -XL Take 1 tablet (10 mg total) by mouth at bedtime. Notes to patient: This medication helps with urinary symptoms often associated with enlarged prostate/ urinary retention.    polyethylene glycol 17 g packet Commonly known as: MiraLax  Take 17 g by mouth daily. Notes to patient: This is a stool softener to prevent constipation.    predniSONE  20 MG tablet Commonly known as: DELTASONE  Take 2 tablets (40 mg total) by mouth daily with  breakfast. For the next four days Notes to patient: This medication is a steroid. Long term use of steroids should be tapered.    Rivaroxaban  15 MG Tabs tablet Commonly known as: XARELTO  Take 1 tablet (15 mg total) by mouth daily with supper. What changed:  medication strength See the new instructions.   rOPINIRole  1 MG tablet Commonly known as: REQUIP  Take 1 tablet (1 mg total) by mouth at bedtime. Notes to patient: This medication helps with restless leg syndrome    rosuvastatin  20 MG tablet Commonly known as: CRESTOR  Take 20 mg by mouth at bedtime. Notes to patient: This is cholesterol medication.    Semaglutide  (1 MG/DOSE) 4 MG/3ML  Sopn Inject 1 mg as directed once a week.   sertraline  100 MG tablet Commonly known as: ZOLOFT  TAKE 1 TABLET DAILY Notes to patient: This is antidepressant.                Discharge Care Instructions  (From admission, onward)           Start     Ordered   03/18/24 0000  No dressing needed        03/18/24 1222            Discharge Exam: Filed Weights   03/17/24 0645  Weight: 90.7 kg      03/18/2024    8:00 AM 03/18/2024    5:30 AM 03/17/2024    8:03 PM  Vitals with BMI  Systolic 94 101 91  Diastolic 56 67 55  Pulse 70 79 70     General - Elderly Caucasian male, sitting, no apparent distress HEENT - PERRLA, EOMI, atraumatic head, non tender sinuses. Lung - Clear, basal rales, no rhonchi, wheezes. Heart - S1, S2 heard, no murmurs, rubs, trace pedal edema. Abdomen - Soft, non tender, bowel sounds good Neuro - Alert, awake and oriented x 3, non focal exam. Skin - Warm and dry.  Condition at discharge: stable  The results of significant diagnostics from this hospitalization (including imaging, microbiology, ancillary and laboratory) are listed below for reference.   Imaging Studies: CT L-SPINE NO CHARGE Result Date: 03/16/2024 CLINICAL DATA:  83 year old male with abdomen and flank pain. Status post spine surgery on 02/25/2024. EXAM: CT LUMBAR SPINE WITH CONTRAST TECHNIQUE: Technique: Multiplanar CT images of the lumbar spine were reconstructed from contemporary CT of the Abdomen and Pelvis. RADIATION DOSE REDUCTION: This exam was performed according to the departmental dose-optimization program which includes automated exposure control, adjustment of the mA and/or kV according to patient size and/or use of iterative reconstruction technique. CONTRAST:  No additional COMPARISON:  CT Abdomen and Pelvis today reported separately. Recent lumbar spine CT 03/03/2024. lumbar MRI 03/05/2024. FINDINGS: Segmentation: Normal, the same numbering system used on CTs and MRIs  this month. Alignment: Stable. Dextroconvex upper and mild levoconvex lower lumbar scoliosis. Stable lordosis. Vertebrae: Hyperostosis related lower thoracic interbody ankylosis through the L1-L2 disc space. Superimposed postoperative changes L2 through L4. Left-side side unilateral pedicle screws and interbody implants. Hardware appears stable. See additional postoperative details below. Sacrum and SI joints appear stable and intact. No acute osseous abnormality identified. Paraspinal and other soft tissues: Abdomen and pelvis are reported separately. Of note, small volume gas in the medial right psoas muscle lateral to the postoperative L3-L4 disc space (series 4, image 69 and coronal image 24) appears related to the site of a postoperative seroma on the MRI this month, which seems regressed since that time. And compared to 03/03/2024, there  is no progressed paraspinal soft tissue inflammation by CT. Disc levels: Interbody ankylosis from flowing endplate osteophytes through the visible lower thoracic spine and L1-L2 (coronal image 34). L2-L3: Stable fusion hardware. Stable residual disc osteophyte complex. No new or progressive features. L3-L4: Stable fusion hardware. Stable residual disc osteophyte complex. No new or progressive features. L4-L5: Chronic advanced disc and endplate degeneration including vacuum disc and disc osteophyte complex asymmetric to the right. This level appears stable. L5-S1: Less advanced chronic disc, endplate, moderate facet degeneration appears stable. IMPRESSION: 1. Stable postoperative CT appearance of the Lumbar spine since 03/03/2024, concordant with 03/05/2024 MRI. No complicating features are identified. 2. Small focus of gas in the right psoas muscle lateral to the postoperative disc space at L3-L4 is probably the site of regressing postoperative seroma. No acute paraspinal soft tissue inflammation by CT. 3. CT Abdomen and Pelvis today reported separately. Electronically Signed    By: VEAR Hurst M.D.   On: 03/16/2024 07:43   CT ABDOMEN PELVIS W CONTRAST Result Date: 03/16/2024 CLINICAL DATA:  Abdominal flank pain. EXAM: CT ABDOMEN AND PELVIS WITH CONTRAST TECHNIQUE: Multidetector CT imaging of the abdomen and pelvis was performed using the standard protocol following bolus administration of intravenous contrast. RADIATION DOSE REDUCTION: This exam was performed according to the departmental dose-optimization program which includes automated exposure control, adjustment of the mA and/or kV according to patient size and/or use of iterative reconstruction technique. CONTRAST:  75mL OMNIPAQUE  IOHEXOL  350 MG/ML SOLN COMPARISON:  03/03/2024 FINDINGS: Lower chest: Calcified granuloma noted posterior left lower lobe. Hepatobiliary: No suspicious focal abnormality within the liver parenchyma. There is no evidence for gallstones, gallbladder wall thickening, or pericholecystic fluid. No intrahepatic or extrahepatic biliary dilation. Pancreas: No focal mass lesion. No dilatation of the main duct. No intraparenchymal cyst. No peripancreatic edema. Spleen: No splenomegaly. No suspicious focal mass lesion. Adrenals/Urinary Tract: No adrenal nodule or mass. Kidneys unremarkable. No evidence for hydroureter. Bladder is moderately distended. Stomach/Bowel: Stomach is unremarkable. No gastric wall thickening. No evidence of outlet obstruction. Duodenum is normally positioned as is the ligament of Treitz. No small bowel wall thickening. No small bowel dilatation. The terminal ileum is normal. The appendix is normal. No gross colonic mass. No colonic wall thickening. Vascular/Lymphatic: There is mild atherosclerotic calcification of the abdominal aorta without aneurysm. There is no gastrohepatic or hepatoduodenal ligament lymphadenopathy. No retroperitoneal or mesenteric lymphadenopathy. No pelvic sidewall lymphadenopathy. Reproductive: Prostate gland is enlarged. Other: No intraperitoneal free fluid. Tiny  extraperitoneal gas bubbles are seen in the left retroperitoneal space (51/3) and in the left inguinal canal (85/3). This is substantially decreased in volume compared to the prior study from 03/03/2024. Previous study also had gas in the left abdominal wall which has resolved completely in the interval. Subcutaneous stranding with calcification in the left flank region is similar to prior. Musculoskeletal: Tiny gas bubble in the right psoas muscle is more prominent than on the previous study. There is high density material in this region of the psoas muscle, new in the interval in potentially an area of new mineralization. Patient is status post discectomy and posterior fusion at L2-3 and L3-4. Please see dedicated lumbar spine CT for more definitive characterization. IMPRESSION: 1. Tiny extraperitoneal gas bubbles in the left retroperitoneal space and left inguinal canal. This is substantially decreased in volume compared to the prior study from 03/03/2024. While residual soft tissue gas is not typically expected more than 7-10 days from surgery, decreasing gas volume suggests that this is resolving postoperative  change. Previous study also had gas in the left abdominal wall which has resolved completely in the interval. 2. Tiny gas bubble in the right psoas muscle is more prominent than on the previous study. There is high density material in this region of the psoas muscle, new in the interval and potentially an area of new mineralization. Given the patient's history of persistent pain, correlation for psoas abscess suggested. Please see report for dedicated lumbar spine CT dictated separately. 3. Subcutaneous stranding with calcification in the left flank region is similar to prior, likely scarring. 4.  Aortic Atherosclerosis (ICD10-I70.0). Electronically Signed   By: Camellia Candle M.D.   On: 03/16/2024 07:25   CT Knee Left Wo Contrast Result Date: 03/11/2024 CLINICAL DATA:  Left knee pain EXAM: CT OF THE  LEFT KNEE WITHOUT CONTRAST TECHNIQUE: Multidetector CT imaging of the left knee was performed according to the standard protocol. Multiplanar CT image reconstructions were also generated. RADIATION DOSE REDUCTION: This exam was performed according to the departmental dose-optimization program which includes automated exposure control, adjustment of the mA and/or kV according to patient size and/or use of iterative reconstruction technique. COMPARISON:  Radiograph 03/03/2024 FINDINGS: Bones/Joint/Cartilage Extensive chondrocalcinosis within along the popliteus tendon proximally. Meniscal chondrocalcinosis noted. Mild articular space narrowing in the medial compartment with small geode along the posterior tibial spine. Patellar spurring along the quadriceps and patellar tendon attachments. Mild sclerosis and localized trabecular accentuation posteromedially in the distal femoral metadiaphysis with region of involvement measuring about 3.8 by 1.3 by 0.9 cm, nonspecific although questionably associated with a tug lesion from the medial head gastrocnemius. No specific aggressive features. Similar but lesser sclerosis in the contralateral knee shown on 07/31/2016. If the patient has a history malignancy with predilection for bony spread or if otherwise clinically indicated, whole-body bone scan and/or MRI may be helpful for further characterization. Ligaments Suboptimally assessed by CT. Muscles and Tendons Chondrocalcinosis of the proximal popliteus tendon and to a lesser extent of the distal semimembranosus tendon. Soft tissues Mild atherosclerosis. IMPRESSION: 1. 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 2. Mild articular space narrowing in the medial compartment with small geode along the posterior tibial spine. 3. Sclerotic lesion with localized trabecular accentuation posteromedially in the distal femoral metadiaphysis, nonspecific  although questionably associated with a tug lesion from the medial head gastrocnemius. No specific aggressive features. Similar but lesser sclerosis in the contralateral knee shown on 07/31/2016. If the patient has a history malignancy with predilection for bony spread or if otherwise clinically indicated, whole-body bone scan and/or MRI may be helpful for further characterization. 4. Mild atherosclerosis. Electronically Signed   By: Ryan Salvage M.D.   On: 03/11/2024 09:43   MR LUMBAR SPINE WO CONTRAST Result Date: 03/05/2024 EXAM: MRI LUMBAR SPINE 03/05/2024 04:43:00 PM TECHNIQUE: Multiplanar multisequence MRI of the lumbar spine was performed without the administration of intravenous contrast. COMPARISON: MRI lumbar spine 03/18/2012 and CT lumbar spine 06/17/2012. CLINICAL HISTORY: Low back pain, cauda equina syndrome suspected. FINDINGS: BONES AND ALIGNMENT: Lumbar lordosis is maintained. Trace retrolisthesis of L1-L2 is similar to prior. Posterior instrumented fusion at L2-L4 with pedicle screws on the left side. Modic type II degenerative endplate changes at L1-L2. SPINAL CORD: Conus medullaris terminates at the L1 level. SOFT TISSUES: Postsurgical changes in the paraspinal soft tissues. The paraspinal musculature is otherwise unremarkable. L1-L2: No significant spinal canal or foraminal stenosis. L2-L3: Surgical changes. Small disc bulge and mild-to-moderate facet arthrosis. There  is mild lateral recess narrowing without significant spinal canal stenosis. No significant foraminal stenosis. L3-L4: Postsurgical changes. Small disc bulge resulting in bilateral recess narrowing. Mild-to-moderate facet arthrosis. No significant spinal canal or foraminal stenosis. L4-L5: Diffuse disc bulge eccentric to the left resulting in moderate recess narrowing along with moderate facet arthrosis. There is mild spinal canal stenosis at L4-L5. No significant foraminal stenosis. L5-S1: Small disc bulge. Moderate facet  arthrosis. No significant spinal canal stenosis. There is mild foraminal stenosis on the left. IMPRESSION: 1. Degenerative changes as above. 2. Mild spinal canal stenosis at L4-5. 3. Lateral recess narrowing at multiple levels. There is possible impingement of the traversing nerve roots at L3-4 and L4-5. 4. Mild left foraminal stenosis at L5-S1. Electronically signed by: Donnice Mania MD 03/05/2024 06:13 PM EDT RP Workstation: [id number]   CT L-SPINE NO CHARGE Result Date: 03/03/2024 CLINICAL DATA:  Back pain and history of prior fusion. EXAM: CT Lumbar Spine without contrast TECHNIQUE: Technique: Multiplanar CT images of the lumbar spine were reconstructed from contemporary CT of the Abdomen and Pelvis. RADIATION DOSE REDUCTION: This exam was performed according to the departmental dose-optimization program which includes automated exposure control, adjustment of the mA and/or kV according to patient size and/or use of iterative reconstruction technique. CONTRAST:  None COMPARISON:  04/27/2023 FINDINGS: Segmentation: 5 lumbar type vertebrae. Alignment: Mild retrolisthesis of L1 on L2, L2 on L3 and L3 on L4 is noted. Mild scoliosis concave to the left is noted at the thoracolumbar junction. Vertebrae: Vertebral body height is well maintained. No acute fracture or acute facet abnormality is noted. Multilevel facet hypertrophic changes and mild osteophytic changes are seen. Changes of prior fusion are seen at L2-3 and L3-4 with pedicle screw placement unilaterally on the left at all 3 levels. Paraspinal and other soft tissues: Paraspinal soft tissues demonstrate small foci of air within the psoas muscles bilaterally consistent with the recent surgery. Postoperative changes in the subcutaneous tissues posteriorly are noted as well. No focal hematoma or sizable fluid collection is noted. Disc levels: Disc space narrowing is noted at L1-2 with osteophytic change and retrolisthesis as described. No significant central  canal stenosis is noted. At L2-3 there is evidence of interval fusion with pedicle screw placement on the left. Ligamentous hypertrophy is noted. Mild osteophytic changes are seen as well. At L3-4, there is evidence of interval fusion with pedicle screw placement on the left. Facet hypertrophic changes are noted with ligamentous hypertrophy. No significant neural impingement is noted although scatter artifact somewhat limits evaluation. At L4-5 posterior osteophytes are seen. Mild associated disc bulging is noted. This in combination with facet hypertrophic changes contribute to mild canal stenosis. At L5-S1, mild generalized bulging of the disc is seen. No significant central canal stenosis is noted. No neural foraminal impingement is seen. IMPRESSION: Postsurgical and degenerative changes. No acute abnormality is identified. Electronically Signed   By: Oneil Devonshire M.D.   On: 03/03/2024 21:46   CT ABDOMEN PELVIS WO CONTRAST Result Date: 03/03/2024 CLINICAL DATA:  Bowel obstruction suspected, recent spinal fusion on 02/25/2024 EXAM: CT ABDOMEN AND PELVIS WITHOUT CONTRAST TECHNIQUE: Multidetector CT imaging of the abdomen and pelvis was performed following the standard protocol without IV contrast. RADIATION DOSE REDUCTION: This exam was performed according to the departmental dose-optimization program which includes automated exposure control, adjustment of the mA and/or kV according to patient size and/or use of iterative reconstruction technique. COMPARISON:  August 13, 2022 FINDINGS: Of note, the lack of intravenous contrast limits evaluation of  the solid organ parenchyma and vascularity. Lower chest: No focal airspace consolidation or pleural effusion. Pacemaker/AICD leads in the right atrium, and right ventricle with epicardial pacing leads. Hepatobiliary: No mass.No radiopaque stones or wall thickening of the gallbladder. No intrahepatic or extrahepatic biliary ductal dilation. Pancreas: No mass or main  ductal dilation. No peripancreatic inflammation or fluid collection. Spleen: Normal size. No mass. Adrenals/Urinary Tract: No adrenal masses. No renal mass. No hydronephrosis or nephrolithiasis. The urinary bladder is distended without focal abnormality. Stomach/Bowel: The stomach is decompressed without focal abnormality. No small bowel wall thickening or inflammation. No small bowel obstruction.Normal appendix. Moderate volume fecal loading throughout the colon. Vascular/Lymphatic: No aortic aneurysm. Scattered aortoiliac atherosclerosis. No intraabdominal or pelvic lymphadenopathy. Reproductive: No prostatomegaly. No free pelvic fluid. Other: Moderate amount of retroperitoneal and extraperitoneal gas scattered along the left hemiabdomen. Gas is also noted in the small left inguinal hernia, which contains intra-abdominal fat. Overlying subcutaneous gas is also present. No pneumoperitoneum or ascites. Musculoskeletal: No acute fracture or destructive lesion. Multilevel degenerative disc disease of the spine. For further characterization regarding the lumbar spine hardware changes, please see the separately dictated lumbar spine CT performed concurrently. IMPRESSION: 1. Moderate amount of retroperitoneal and extraperitoneal gas scattered along the left hemiabdomen. Overlying subcutaneous gas is also present. No pneumoperitoneum or ascites. These are expected findings status post the patient's recent lumbar fusion. 2. Moderate volume fecal loading throughout the colon, as can be seen in constipation. Aortic Atherosclerosis (ICD10-I70.0). Electronically Signed   By: Rogelia Myers M.D.   On: 03/03/2024 20:00   DG Knee Complete 4 Views Left Result Date: 03/03/2024 CLINICAL DATA:  Knee pain EXAM: LEFT KNEE - COMPLETE 4+ VIEW COMPARISON:  None Available. FINDINGS: No evidence of fracture, dislocation, or joint effusion. Degenerative changes of the patellofemoral joint and mild spiking of the tibial spine. Sclerotic  ill-defined lesion along the medial cortex of the distal femoral diaphysis. Soft tissues are unremarkable. IMPRESSION: Eccentric sclerotic lesion of distal femoral diaphysis may represent a benign bone island, nonossifying fibroma, sclerotic osseous metastasis or other osseous pathology including neoplastic process. Recommend CT for further assessment if clinically warranted. Mild degenerative changes of the knee joint. Electronically Signed   By: Megan  Zare M.D.   On: 03/03/2024 18:41   DG Lumbar Spine 2-3 Views Result Date: 02/25/2024 CLINICAL DATA:  461500 Elective surgery 461500. EXAM: LUMBAR SPINE - 2-3 VIEW COMPARISON:  None Available. FINDINGS: Multiple intraoperative spot radiographs are submitted. There is posterior spinal fusion of 3 consecutive lumbar vertebrae with transpedicular screw/rods and intervening disc spacers. Please refer to the separately issued operative report for further details. Fluoroscopy time: 147.5 seconds Radiation dose: 110.51 mGy IMPRESSION: Intraoperative fluoroscopic spot radiographs, as described above. Electronically Signed   By: Ree Molt M.D.   On: 02/25/2024 11:12   DG C-Arm 1-60 Min-No Report Result Date: 02/25/2024 Fluoroscopy was utilized by the requesting physician.  No radiographic interpretation.   DG C-Arm 1-60 Min-No Report Result Date: 02/25/2024 Fluoroscopy was utilized by the requesting physician.  No radiographic interpretation.   DG C-Arm 1-60 Min-No Report Result Date: 02/25/2024 Fluoroscopy was utilized by the requesting physician.  No radiographic interpretation.    Microbiology: Results for orders placed or performed during the hospital encounter of 02/15/24  Surgical pcr screen     Status: Abnormal   Collection Time: 02/15/24 11:53 AM   Specimen: Nasal Mucosa; Nasal Swab  Result Value Ref Range Status   MRSA, PCR NEGATIVE NEGATIVE Final   Staphylococcus aureus  POSITIVE (A) NEGATIVE Final    Comment: (NOTE) The Xpert SA Assay (FDA  approved for NASAL specimens in patients 11 years of age and older), is one component of a comprehensive surveillance program. It is not intended to diagnose infection nor to guide or monitor treatment. Performed at [facility], [address] [place], [place] [address]    *Note: Due to a large number of results and/or encounters for the requested time period, some results have not been displayed. A complete set of results can be found in Results Review.    Labs: CBC: Recent Labs  Lab 03/16/24 0300 03/17/24 0507  WBC 8.4 5.7  HGB 16.2 15.8  HCT 48.1 46.8  MCV 89.2 88.6  PLT 190 157   Basic Metabolic Panel: Recent Labs  Lab 03/16/24 0300 03/17/24 0507  NA 136 136  K 3.9 4.4  CL 102 104  CO2 25 26  GLUCOSE 141* 114*  BUN 23 22  CREATININE 1.38* 1.51*  CALCIUM  9.1 8.9   Liver Function Tests: Recent Labs  Lab 03/16/24 0300 03/17/24 0507  AST 23 17  ALT 30 21  ALKPHOS 166* 142*  BILITOT 0.9 0.7  PROT 6.8 6.3*  ALBUMIN 3.4* 3.0*   CBG: Recent Labs  Lab 03/17/24 1118 03/17/24 1712 03/17/24 2154 03/18/24 0748 03/18/24 1148  GLUCAP 165* 202* 204* 166* 170*    Discharge time spent: 35 minutes.  Signed: Concepcion Riser, MD [facility] 03/18/2024

## 2024-03-18 NOTE — Plan of Care (Signed)

## 2024-03-18 NOTE — Progress Notes (Signed)
 Occupational Therapy Treatment Patient Details Name: Mason [name] MRN: [id number] DOB: 05/02/1941 Today's Date: 03/18/2024   History of present illness Pt is an 83 y/o M admitted on 03/16/24 after presenting with back & L knee pain. Pt with recent admission 02/25/24-02/27/24 for L2-L4 RP interbody decompression & fusion. Pt with hx of abnormal CT of L knee. Ortho consulted & recommended knee MRI. PMH: heart block s/p pacemaker, chronic HFrEF, CKD 3, a-fib, HLD, HTN   OT comments  Pt grand daughter in room helping prepare for dc. Pt lying sideways in bed with one LE off bed and torso twisted with report of LLE pain; redirected not to rest in twisted position and reviewed precautions with pt with continued decr recall of precautions. Pt grand daughter reporting he does know them and states he is just ready to leave, but consistently performing movements not within precautions (bending toward feet to adjust socks, twisting/lifting during bed mobility, and twisting with pivot to chair. Pt needing increased time and repeated education. Educated pt and grand daughter re: importance of precautions in pain management and grand daughter reports his wife will be able to cue him at home. Pt to continue to benefit from acute OT services. Recommending discharge home with HHOT and support from family and cueing for precautions as needed.        If plan is discharge home, recommend the following:  Assist for transportation;A little help with bathing/dressing/bathroom   Equipment Recommendations  None recommended by OT    Recommendations for Other Services      Precautions / Restrictions Precautions Precautions: Back Precaution Booklet Issued: No Recall of Precautions/Restrictions: Impaired Precaution/Restrictions Comments: able to recall 1/3 precautions with increased time. reminded of BLT precautions and pt able to recall 2/3 with min cues at end of session, however, poor functional implementation  and Required Braces or Orthoses: Spinal Brace Spinal Brace: Lumbar corset;Applied in sitting position (per pt (Dr. Louis confirmed), pt does not have to wear brace inside his home)       Mobility Bed Mobility Overal bed mobility: Needs Assistance Bed Mobility: Rolling, Sidelying to Sit Rolling: Min assist Sidelying to sit: Min assist       General bed mobility comments: min A for multimodal/tactile cues for log roll technique    Transfers Overall transfer level: Needs assistance Equipment used: Rolling walker (2 wheels) Transfers: Sit to/from Stand Sit to Stand: Supervision           General transfer comment: cues for hand placement and posture once standing     Balance Overall balance assessment: Needs assistance Sitting-balance support: Feet supported Sitting balance-Leahy Scale: Good     Standing balance support: Reliant on assistive device for balance, Bilateral upper extremity supported, During functional activity Standing balance-Leahy Scale: Fair                             ADL either performed or assessed with clinical judgement   ADL Overall ADL's : Needs assistance/impaired                     Lower Body Dressing: Contact guard assist;Cueing for compensatory techniques;Sit to/from stand   Toilet Transfer: Contact guard assist;Stand-pivot;Rolling walker (2 wheels)           Functional mobility during ADLs: Supervision/safety;Rolling walker (2 wheels)      Extremity/Trunk Assessment Upper Extremity Assessment Upper Extremity Assessment: Overall WFL for tasks assessed   Lower  Extremity Assessment Lower Extremity Assessment: Defer to PT evaluation        Vision       Perception     Praxis     Communication Communication Communication: No apparent difficulties   Cognition Arousal: Alert Behavior During Therapy: WFL for tasks assessed/performed Cognition: Cognition impaired       Memory impairment (select all  impairments): Short-term memory     OT - Cognition Comments: decr recall and needs cues to implement spinal precautions                 Following commands: Intact        Cueing   Cueing Techniques: Verbal cues  Exercises      Shoulder Instructions       General Comments      Pertinent Vitals/ Pain       Pain Assessment Pain Assessment: Faces Faces Pain Scale: Hurts even more Pain Location: L knee, back Pain Descriptors / Indicators: Discomfort Pain Intervention(s): Limited activity within patient's tolerance, Monitored during session  Home Living                                          Prior Functioning/Environment              Frequency  Min 2X/week        Progress Toward Goals  OT Goals(current goals can now be found in the care plan section)  Progress towards OT goals: Progressing toward goals (limited by knowledge of spinal precautions)  Acute Rehab OT Goals Patient Stated Goal: get better OT Goal Formulation: With patient Time For Goal Achievement: 03/31/24 Potential to Achieve Goals: Good ADL Goals Pt Will Perform Lower Body Dressing: with supervision;sit to/from stand Pt Will Transfer to Toilet: with modified independence;ambulating  Plan      Co-evaluation                 AM-PAC OT 6 Clicks Daily Activity     Outcome Measure   Help from another person eating meals?: None Help from another person taking care of personal grooming?: A Little Help from another person toileting, which includes using toliet, bedpan, or urinal?: A Little Help from another person bathing (including washing, rinsing, drying)?: A Little Help from another person to put on and taking off regular upper body clothing?: None Help from another person to put on and taking off regular lower body clothing?: A Little 6 Click Score: 20    End of Session Equipment Utilized During Treatment: Rolling walker (2 wheels)  OT Visit Diagnosis:  Unsteadiness on feet (R26.81)   Activity Tolerance Patient tolerated treatment well   Patient Left Other (comment) (in transport chair for dc with transport personnel present and grand daughter)   Nurse Communication Mobility status        Time: 8683-8673 OT Time Calculation (min): 10 min  Charges: OT General Charges $OT Visit: 1 Visit OT Treatments $Self Care/Home Management : 8-22 mins  Elma JONETTA Penner, OTD, OTR/L [facility] Acute Rehabilitation Office: [phone]   Elma JONETTA Penner 03/18/2024, 1:36 PM

## 2024-03-18 NOTE — TOC Transition Note (Signed)
 Transition of Care [facility]) - Discharge Note   Patient Details  Name: Mason [name] MRN: [id number] Date of Birth: February 24, 1941  Transition of Care [facility]) CM/SW Contact:  Tom-Johnson, Bell Cai Daphne, RN Phone Number: 03/18/2024, 1:12 PM   Clinical Narrative:     Patient is scheduled for discharge today.  Readmission Risk Assessment done. Home health info, outpatient f/u, hospital f/u and discharge instructions on AVS. Wife, Patsy to transport at discharge.  No further ICM needs noted.        Final next level of care: Home w Home Health Services Barriers to Discharge: Barriers Resolved   Patient Goals and CMS Choice Patient states their goals for this hospitalization and ongoing recovery are:: To return home CMS Medicare.gov Compare Post Acute Care list provided to:: Patient Choice offered to / list presented to : Patient      Discharge Placement                Patient to be transferred to facility by: Wife Name of family member notified: Patsy    Discharge Plan and Services Additional resources added to the After Visit Summary for                  DME Arranged: N/A DME Agency: NA       HH Arranged: PT, OT HH Agency: [facility] Date [facility] Agency Contacted: 03/18/24 Time HH Agency Contacted: 1048 Representative spoke with at [facility] Agency: Darleene  Social Drivers of Health (SDOH) Interventions SDOH Screenings   Food Insecurity: No Food Insecurity (03/16/2024)  Housing: Low Risk  (03/16/2024)  Transportation Needs: No Transportation Needs (03/16/2024)  Utilities: Not At Risk (03/16/2024)  Alcohol Screen: Low Risk  (09/05/2023)  Depression (PHQ2-9): High Risk (02/28/2024)  Financial Resource Strain: Low Risk  (09/05/2023)  Physical Activity: Inactive (09/05/2023)  Social Connections: Moderately Integrated (03/17/2024)  Stress: No Stress Concern Present (09/05/2023)  Tobacco Use: Low Risk  (03/16/2024)  Health Literacy: Adequate Health Literacy (09/05/2023)      Readmission Risk Interventions    03/18/2024    1:10 PM  Readmission Risk Prevention Plan  Transportation Screening Complete  PCP or Specialist Appt within 5-7 Days Complete  Home Care Screening Complete  Medication Review (RN CM) Referral to Pharmacy

## 2024-03-18 NOTE — Progress Notes (Signed)
 Patient looks significantly better last night.  Minimal lower extremity pain.  Still with a little bit of paresthesias.  Okay for discharge from my standpoint.

## 2024-03-19 ENCOUNTER — Telehealth: Payer: Self-pay

## 2024-03-19 NOTE — Transitions of Care (Post Inpatient/ED Visit) (Signed)
   03/19/2024  Name: Mason [name] MRN: [id number] DOB: 1941/03/22  Today's TOC FU Call Status: Today's TOC FU Call Status:: Unsuccessful Call (1st Attempt) Unsuccessful Call (1st Attempt) Date: 03/19/24  Attempted to reach the patient regarding the most recent Inpatient/ED visit.  Follow Up Plan: Additional outreach attempts will be made to reach the patient to complete the Transitions of Care (Post Inpatient/ED visit) call.   Arvin Seip RN, BSN, CCM [employer], Population Health Case Manager Phone: [phone]

## 2024-03-20 ENCOUNTER — Other Ambulatory Visit: Payer: Self-pay | Admitting: Family Medicine

## 2024-03-20 ENCOUNTER — Telehealth: Payer: Self-pay

## 2024-03-20 ENCOUNTER — Emergency Department (HOSPITAL_COMMUNITY)
Admission: EM | Admit: 2024-03-20 | Discharge: 2024-03-20 | Disposition: A | Discharge status: Home / Self Care | Attending: Emergency Medicine | Admitting: Emergency Medicine

## 2024-03-20 ENCOUNTER — Encounter: Payer: Self-pay | Admitting: Family Medicine

## 2024-03-20 ENCOUNTER — Emergency Department (HOSPITAL_COMMUNITY)

## 2024-03-20 ENCOUNTER — Other Ambulatory Visit: Payer: Self-pay | Admitting: Pharmacist

## 2024-03-20 ENCOUNTER — Ambulatory Visit (INDEPENDENT_AMBULATORY_CARE_PROVIDER_SITE_OTHER): Admitting: Family Medicine

## 2024-03-20 VITALS — BP 64/42 | HR 71 | Ht 71.0 in | Wt 202.2 lb

## 2024-03-20 DIAGNOSIS — Z8673 Personal history of transient ischemic attack (TIA), and cerebral infarction without residual deficits: Secondary | ICD-10-CM | POA: Insufficient documentation

## 2024-03-20 DIAGNOSIS — I700 Atherosclerosis of aorta: Secondary | ICD-10-CM | POA: Diagnosis not present

## 2024-03-20 DIAGNOSIS — M25562 Pain in left knee: Secondary | ICD-10-CM | POA: Insufficient documentation

## 2024-03-20 DIAGNOSIS — I9589 Other hypotension: Secondary | ICD-10-CM | POA: Diagnosis not present

## 2024-03-20 DIAGNOSIS — Z794 Long term (current) use of insulin: Secondary | ICD-10-CM | POA: Diagnosis not present

## 2024-03-20 DIAGNOSIS — I959 Hypotension, unspecified: Secondary | ICD-10-CM | POA: Diagnosis not present

## 2024-03-20 DIAGNOSIS — Z950 Presence of cardiac pacemaker: Secondary | ICD-10-CM | POA: Insufficient documentation

## 2024-03-20 DIAGNOSIS — M5442 Lumbago with sciatica, left side: Secondary | ICD-10-CM | POA: Diagnosis not present

## 2024-03-20 DIAGNOSIS — E119 Type 2 diabetes mellitus without complications: Secondary | ICD-10-CM | POA: Diagnosis not present

## 2024-03-20 DIAGNOSIS — I5022 Chronic systolic (congestive) heart failure: Secondary | ICD-10-CM | POA: Insufficient documentation

## 2024-03-20 DIAGNOSIS — R531 Weakness: Secondary | ICD-10-CM | POA: Diagnosis not present

## 2024-03-20 DIAGNOSIS — R52 Pain, unspecified: Secondary | ICD-10-CM

## 2024-03-20 DIAGNOSIS — Z8679 Personal history of other diseases of the circulatory system: Secondary | ICD-10-CM | POA: Diagnosis not present

## 2024-03-20 DIAGNOSIS — I509 Heart failure, unspecified: Secondary | ICD-10-CM | POA: Diagnosis not present

## 2024-03-20 DIAGNOSIS — I771 Stricture of artery: Secondary | ICD-10-CM | POA: Diagnosis not present

## 2024-03-20 LAB — URINALYSIS, ROUTINE W REFLEX MICROSCOPIC
Bilirubin Urine: NEGATIVE
Glucose, UA: >=500 mg/dL — AB
Ketones, ur: NEGATIVE mg/dL
Nitrite: NEGATIVE
Protein, ur: NEGATIVE mg/dL
Specific Gravity, Urine: 1.01 (ref 1.005–1.030)
pH: 6 (ref 5.0–8.0)

## 2024-03-20 LAB — CBC WITH DIFFERENTIAL/PLATELET
Abs Immature Granulocytes: 0.03 K/uL (ref 0.00–0.07)
Basophils Absolute: 0 K/uL (ref 0.0–0.1)
Basophils Relative: 0 %
Eosinophils Absolute: 0.2 K/uL (ref 0.0–0.5)
Eosinophils Relative: 3 %
HCT: 42.5 % (ref 39.0–52.0)
Hemoglobin: 14.1 g/dL (ref 13.0–17.0)
Immature Granulocytes: 1 %
Lymphocytes Relative: 23 %
Lymphs Abs: 1.4 K/uL (ref 0.7–4.0)
MCH: 29.6 pg (ref 26.0–34.0)
MCHC: 33.2 g/dL (ref 30.0–36.0)
MCV: 89.1 fL (ref 80.0–100.0)
Monocytes Absolute: 0.5 K/uL (ref 0.1–1.0)
Monocytes Relative: 9 %
Neutro Abs: 3.8 K/uL (ref 1.7–7.7)
Neutrophils Relative %: 64 %
Platelets: 160 K/uL (ref 150–400)
RBC: 4.77 MIL/uL (ref 4.22–5.81)
RDW: 12.6 % (ref 11.5–15.5)
WBC: 5.8 K/uL (ref 4.0–10.5)
nRBC: 0 % (ref 0.0–0.2)

## 2024-03-20 LAB — RESP PANEL BY RT-PCR (RSV, FLU A&B, COVID)  RVPGX2
Influenza A by PCR: NEGATIVE
Influenza B by PCR: NEGATIVE
Resp Syncytial Virus by PCR: NEGATIVE
SARS Coronavirus 2 by RT PCR: NEGATIVE

## 2024-03-20 LAB — LIPASE, BLOOD: Lipase: 31 U/L (ref 11–51)

## 2024-03-20 LAB — COMPREHENSIVE METABOLIC PANEL WITH GFR
ALT: 25 U/L (ref 0–44)
AST: 32 U/L (ref 15–41)
Albumin: 3.1 g/dL — ABNORMAL LOW (ref 3.5–5.0)
Alkaline Phosphatase: 122 U/L (ref 38–126)
Anion gap: 11 (ref 5–15)
BUN: 18 mg/dL (ref 8–23)
CO2: 22 mmol/L (ref 22–32)
Calcium: 8.5 mg/dL — ABNORMAL LOW (ref 8.9–10.3)
Chloride: 103 mmol/L (ref 98–111)
Creatinine, Ser: 1.4 mg/dL — ABNORMAL HIGH (ref 0.61–1.24)
GFR, Estimated: 50 mL/min — ABNORMAL LOW (ref >60)
Glucose, Bld: 125 mg/dL — ABNORMAL HIGH (ref 70–99)
Potassium: 4 mmol/L (ref 3.5–5.1)
Sodium: 136 mmol/L (ref 135–145)
Total Bilirubin: 0.6 mg/dL (ref 0.0–1.2)
Total Protein: 6.1 g/dL — ABNORMAL LOW (ref 6.5–8.1)

## 2024-03-20 LAB — I-STAT CG4 LACTIC ACID, ED: Lactic Acid, Venous: 1.8 mmol/L (ref 0.5–1.9)

## 2024-03-20 LAB — MAGNESIUM: Magnesium: 2 mg/dL (ref 1.7–2.4)

## 2024-03-20 NOTE — Progress Notes (Signed)
 03/20/2024  CC:  Chief Complaint  Patient presents with   Hospitalization Follow-up    Patient is a 83 y.o. male who presents for  hospital follow up, specifically [facility] face-to-face visit. Dates hospitalized: 03/16/2024 to 03/18/2024. Days since d/c from hospital: 2 days Patient was discharged from hospital to home. Reason for admission to hospital: Intractable pain. Date of interactive (phone) contact with patient and/or caregiver: 2 unsuccessful attempts contact patient were made yesterday.  I have reviewed patient's discharge summary plus pertinent specific notes, labs, and imaging from the hospitalization.  He had left-sided L2-3 and L3-4 retroperitoneal interbody decompression and fusion with posterior percutaneous fixation on 02/25/2024 by Dr. Louis.  Postop course in the hospital was uneventful. He then had an emergency department visit on 03/03/2024 for constipation and left knee pain. 03/05/2024 ED visit for acute on chronic low back pain. 03/11/2024 ED visit for ongoing severe low back pain. 03/16/2024--> admitted for intractable left knee and low back pain. Orthopedics saw him for his knee (August 19 CT of the knee imaging showing  Extensive chondrocalcinosis along the popliteus tendon proximally and to a lesser extent of the distal semimembranosus tendon, articular joint space narrowing in the medial compartment, Sclerotic lesion with localized trabecular accentuation posteromedially in the distal femoral metadiaphysis) and had low suspicion for gout or septic knee.  They advised MRI of knee but this cannot be done due to his pacemaker.  They recommended pain control with opiates and PT. Back imaging did not show any concerning acute finding.  CURRENTLY: Upon presentation today he is lightheaded and fatigued and can barely walking with his walker.  He was able to get on the scale to weigh. He has not been eating or drinking since his discharge 2 days ago.  His  pain has been too severe.  He has continued to take his medication, including a dose of insulin  this morning.  He denies chest pain, shortness of breath, abdominal pain, headache, blood in stool, black stool, palpitations, or n/v.  He has taken Dilaudid  as instructed.  He has held the Toprol  as instructed. The main thing he still complains about at this time is severe low back pain and pain in the lateral aspect of the left thigh and over the left knee.  Discharge medications: He was instructed to stop Toprol -XL 50 mg. He was instructed to take Entresto  97-103, 1 twice daily, eplerenone  25 mg 1 tab daily, gabapentin  600 mg twice a day, hydromorphone  2 mg tab, one half tab every 6 hours as needed, Jardiance  10 mg a day, Lantus  insulin  30 to 34 units daily, lidocaine  5% patch, 1 patch daily, meclizine  25 mg 3 times daily as needed, Robaxin  500 mg tabs, 2 tabs every 6 hours as needed, NovoLog  10 units before every meal, omeprazole  40 mg daily, oxybutynin  XL 10 mg daily, MiraLAX  1 capful daily, prednisone  40 mg/day for the next 4 days, Xarelto  15 mg daily, ropinirole  1 mg nightly, rosuvastatin  20 mg daily, semaglutide  1 mg weekly, sertraline  100 mg a day.  Medication reconciliation was done today and patient is taking meds as recommended by discharging hospitalist/specialist.    ROS as above, plus--> no swelling or redness of the knee.  No fevers, no wheezing, no cough,no HAs, no rashesNo polyuria or polydipsia.  No myalgias or arthralgias.  No focal weakness, paresthesias, or tremors.  No acute vision or hearing abnormalities.  No dysuria or unusual/new urinary urgency or frequency.  No recent changes in lower legs.  PMH:  Past Medical History:  Diagnosis Date   AICD (automatic cardioverter/defibrillator) present 2018   MDT CRT-D.  Fatigue-->completely pacer dependent.  Pacer settings adjusted 12/2017 to allow more chronotrophc variance with ADL's//exertion.   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   02/2016 EF 40%, diffuse hypokinesis, grade 2 DD.  Myoc perf imaging showed EF 32% 04/2016.  Pt upgraded to CRT-D 01/04/17.   Chronic renal insufficiency, stage III (moderate) (HCC) 2015   CrCl about 60 ml/min   Complete heart block (HCC)    Has dual chamber pacer.   COVID-19 virus infection 01/05/2021   paxlovid   Depression    DOE (dyspnea on exertion)    NYHA class II/III CHF   Dyspnea 2021   with exertion, bending over   Dysrhythmia    A. Fib   Episodic low back pain 01/22/2013   w/intermittent radiculitis (12/2014 his neurologist referred him to pain mgmt for epidural steroid injection)   Erectile dysfunction 2019   due to zoloft --urol rx'd viagra   GERD (gastroesophageal reflux disease)    H/O tilt table evaluation 11/02/2005   negative   Helicobacter pylori gastritis 01/2016   History of adenomatous polyp of colon 10/12/2011   Dr. Rollin (3 right side of colon- tubular adenomas removed)   History of cardiovascular stress test 05/28/2012   no ischemia, EF 37%, imaging results are unchanged and within normal variance   History of chronic prostatitis    History of kidney stones    History of vertigo    + Hx of posterior HA's.  Neuro (Dr. Maurice) eval 2011.  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   Hyperlipidemia    Hypertension    Lumbar spondylosis    lumbosacral radiculopathy at L4 by EMG testing, right foot drop (neurologist is Dr. Birder with [facility] in [place])--neurologist referred him to neurosurgery   Migraine    used to have them all the time; none for  years (01/04/2017)   Myocardial infarction [facility]) ?1970s   not entirely certain of this   Nephrolithiasis 07/2012   Left UVJ 2 mm stone with dilation of renal collecting system and slight hydroureter on right   Neuropathy    NICM (nonischemic cardiomyopathy) (HCC)    a. 02/2018 Cath: LM nl, LAD min irregs, LCX no, RCA 20d. [id number]. Fick CO/CI 4.4/2.0.   Osteoarthritis, multiple sites    Shoulders, back, knees   Pacemaker 02/05/2012   dual chamber, complete heart block, meddtronic revo, lasted checked 12/2015.  Since no CAD on cath 05/2016, cards recommends upgrade to CRT-D.   Peripheral vascular disease (HCC)    Peripheral Artery Disease   Permanent atrial fibrillation (HCC)    DCCV 07/09/13-converted, lasted two days, then back into afib--needs lifetime anticoagulation (Xarelto  as of 09/2014)   Prostate cancer screening 09/2017   done by urol annually (normal prostate exam documented + PSA 0.84 as of 10/01/17 urol f/u.  10/2018 urol f/u PSA 0.6, no prostate nodule.   Rectus diastasis    Right ankle sprain 08/2017   w/distal fibula avulsion fx noted on u/s but not plain  film-(Dr. Cleatrice).   Skin cancer of arm, left    burned it off (01/04/2017)   TIA (transient ischemic attack)    L face and L arm weakness. Peri procedural->a. 03/22/2018 following cath. CT head neg. No MRI b/c has pacer. Likely due to embolus to distal branch of RMCA   Type II diabetes mellitus (HCC)     PSH:  Past Surgical History:  Procedure Laterality Date   ABI's Bilateral 05/21/2018   normal   ANTERIOR LATERAL LUMBAR FUSION WITH PERCUTANEOUS SCREW 2 LEVEL Left 02/25/2024   Procedure: LEFT ANTERIOR LATERAL LUMBAR FUSION WITH PERCUTANEOUS PEDICLE SCREW LUMBAR TWO-LUMBAR THREE, LUMBAR THREE-LUMBAR FOUR, POSTERIOR LATERAL AND INTERBODY FUSION;  Surgeon: Louis Shove, MD;  Location: [facility] OR;  Service: Neurosurgery;  Laterality: Left;  XLIF - left - L2-L3 - L3-L4 with perc pedicle screws - Posterior Lateral and Interbody fusion    BACK SURGERY     BIV ICD INSERTION CRT-D N/A 01/04/2017   Procedure: BiV ICD ;  Surgeon: Inocencio Soyla Lunger, MD;  Location: [facility] INVASIVE CV LAB;  Service: Cardiovascular;  Laterality: N/A;   CARDIAC CATHETERIZATION N/A 06/14/2016   Minimal nonobstructive dz, EF 25-35%.  Procedure: Left Heart Cath and Coronary Angiography;  Surgeon: Peter M [place]az[place], MD;  Location: [facility] INVASIVE CV LAB;  Service: Cardiovascular;  Laterality: N/A;   CARDIOVASCULAR STRESS TEST  2012   2012 nuclear perfusion study: low risk scan; 04/2016 normal myocardial perfusion imaging, EF 32%.   CARDIOVERSION  07/09/2012   Procedure: CARDIOVERSION;  Surgeon: Jerel Balding, MD;  Location: [facility] ENDOSCOPY;  Service: Cardiovascular;  Laterality: N/A;   CATARACT EXTRACTION W/ INTRAOCULAR LENS IMPLANT & ANTERIOR VITRECTOMY, BILATERAL Bilateral    CIRCUMCISION N/A 03/23/2020   Procedure: CIRCUMCISION ADULT;  Surgeon: Rosalind Zachary NOVAK, MD;  Location: [facility] ORS;  Service: Urology;  Laterality: N/A;   COLONOSCOPY W/ POLYPECTOMY  approx 2006; repeated 09/2011   Polyps on 2013 EGD as well, repeat 12/2014   COLONOSCOPY WITH PROPOFOL  N/A 07/08/2021   adenoma x 1. Procedure: COLONOSCOPY WITH PROPOFOL ;  Surgeon: Rollin Dover, MD;  Location: [facility] ENDOSCOPY;  Service: Endoscopy;  Laterality: N/A;   ESOPHAGEAL DILATION  10/19/2023   Procedure: DILATION, ESOPHAGUS;  Surgeon: Rollin Dover, MD;  Location: [facility] ENDOSCOPY;  Service: Gastroenterology;;   ESOPHAGOGASTRODUODENOSCOPY  10/18/2006   Done due to chronic GERD: Normal, bx showed no barrett's esophagus (Dr. Rollin)   EYE SURGERY  01/08/2023   FLEXOR TENDON REPAIR Left 10/02/2016   Procedure: LEFT RING FINGER WOUND EXPORATION AND FLEXOR TENDON REPAIR AND NERVE REPAIR;  Surgeon: Alm Hummer, MD;  Location: [facility] OR;  Service: Orthopedics;  Laterality: Left;   INSERT / REPLACE / REMOVE PACEMAKER  02/05/2012   dual chamber, sinus node dysfunction, sinus arrest, PAF, Medtronic Revo serial#[id number] H: last  checked 05/2015   LUMBAR LAMINECTOMY Left 1976   L4-5   PACEMAKER REMOVAL  01/04/2017   PERMANENT PACEMAKER INSERTION N/A 02/05/2012   Procedure: PERMANENT PACEMAKER INSERTION;  Surgeon: Jerel Balding, MD; Generator Medtronic [place] model [place] serial number [id number] H Laterality: N/A;   POLYPECTOMY  07/08/2021   Procedure: POLYPECTOMY;  Surgeon: Rollin Dover, MD;  Location: [facility] ENDOSCOPY;  Service: Endoscopy;;   RETINAL DETACHMENT SURGERY Left ~ 1999   REVERSE SHOULDER ARTHROPLASTY Left 2018   Left shoulder reverse [facility] in [place]).   RIGHT/LEFT HEART CATH AND CORONARY ANGIOGRAPHY N/A 03/22/2018   EF 30-35%, no CAD.  Procedure: RIGHT/LEFT HEART CATH AND CORONARY ANGIOGRAPHY;  Surgeon: Cherrie Toribio SAUNDERS, MD;  Location: [facility] INVASIVE CV LAB;  Service: Cardiovascular;  Laterality: N/A;   SAVORY DILATION N/A 10/19/2023   Procedure: EGD, WITH DILATION USING SAVARY-GILLIARD DILATOR OVER GUIDEWIRE;  Surgeon: Rollin Dover, MD;  Location: [facility] ENDOSCOPY;  Service: Gastroenterology;  Laterality: N/A;   TRANSTHORACIC ECHOCARDIOGRAM  08/25/10; 05/2012; 03/23/16;12/2017   mild asymmetric LVH, normal systolic function, normal diastolic fxn, mild-to-mod mitral regurg, mild aortic valve sclerosis and trace AI, mild aortic root dilatation. 2014 f/u showed EF 40-45%, mod LAE, A FIB.  02/2016 EF 40%, diffuse hypokinesis, grade 2 DD. 12/2017 EF 35-40%,diffuse hypokin,grd III DD, mild MR   WISDOM TOOTH EXTRACTION  06/27/2021   EF 50-55%, mild aortic root/ascending aorta dilatation (41-42 mm).    MEDS:  Outpatient Medications Prior to Visit  Medication Sig Dispense Refill   ENTRESTO  97-103 MG TAKE 1 TABLET TWO TIMES A DAY 180 tablet 3   eplerenone  (INSPRA ) 25 MG tablet TAKE 1 TABLET DAILY 90 tablet 3   gabapentin  (NEURONTIN ) 300 MG capsule TAKE 2 CAPSULES TWICE A DAY 360 capsule 3   HYDROmorphone  (DILAUDID ) 2 MG tablet Take 0.5 tablets (1 mg total) by mouth every 6 (six) hours as needed for severe  pain (pain score 7-10). 13 tablet 0   insulin  aspart (NOVOLOG  FLEXPEN) 100 UNIT/ML FlexPen Inject 10 units before each meal (Patient taking differently: 12 Units 3 (three) times daily with meals.) 30 mL 3   insulin  glargine (LANTUS  SOLOSTAR) 100 UNIT/ML Solostar Pen Inject 30-34 Units into the skin daily. (Patient taking differently: Inject 28 Units into the skin daily.)     JARDIANCE  10 MG TABS tablet TAKE 1 TABLET DAILY 90 tablet 3   latanoprost  (XALATAN ) 0.005 % ophthalmic solution Place 1 drop into both eyes at bedtime.     lidocaine  (LIDODERM ) 5 % PLACE 1 PATCH ON THE SKIN DAILY. REMOVE AND DISCARD PATCH WITHIN 12 HOURS OR AS DIRECTED BY DOCTOR 90 patch 0   meclizine  (ANTIVERT ) 25 MG tablet TAKE 1 TABLET THREE TIMES A DAY AS NEEDED FOR DIZZINESS 270 tablet 1   methocarbamol  (ROBAXIN ) 500 MG tablet Take 2 tablets (1,000 mg total) by mouth every 6 (six) hours as needed for muscle spasms. 56 tablet 0   mupirocin  ointment (BACTROBAN ) 2 % Place 1 Application into the nose 2 (two) times daily for 60 doses. Use as directed 2 times daily for 5 days every other week for 6 weeks. 60 g 0   oxybutynin  (DITROPAN -XL) 10 MG 24 hr tablet Take 1 tablet (10 mg total) by mouth at bedtime. 90 tablet 0   Rivaroxaban  (XARELTO ) 15 MG TABS tablet Take 1 tablet (15 mg total) by mouth daily with supper. 90 tablet 0   rOPINIRole  (REQUIP ) 1 MG tablet Take 1 tablet (1 mg total) by mouth at bedtime. 90 tablet 3   rosuvastatin  (CRESTOR ) 20 MG tablet Take 20 mg by mouth at bedtime.     Semaglutide , 1 MG/DOSE, 4 MG/3ML SOPN Inject 1 mg as directed once a week. (Patient taking differently: Inject 1 mg as directed once a week.) 9 mL 3   sertraline  (ZOLOFT ) 100 MG tablet TAKE 1 TABLET DAILY 90 tablet 0   chlorhexidine  (HIBICLENS ) 4 % external liquid Apply 15 mLs (1 Application total) topically as directed for 30 doses. Use as directed daily for 5 days every other week for 6 weeks. (Patient not taking: Reported on 03/20/2024) 946 mL  1   COMBIGAN  0.2-0.5 % ophthalmic solution Place 1 drop into both eyes every 12 (twelve) hours. (Patient  not taking: Reported on 03/20/2024)     omeprazole  (PRILOSEC) 40 MG capsule Take 40 mg by mouth daily. (Patient not taking: Reported on 03/20/2024)     polyethylene glycol (MIRALAX ) 17 g packet Take 17 g by mouth daily. 14 each 0   predniSONE  (DELTASONE ) 20 MG tablet Take 2 tablets (40 mg total) by mouth daily with breakfast. For the next four days (Patient not taking: Reported on 03/20/2024) 8 tablet 0   No facility-administered medications prior to visit.    Physical Exam     03/20/2024    3:37 PM 03/18/2024    8:00 AM 03/18/2024    5:30 AM  Vitals with BMI  Height 5' 11    Weight 202 lbs 3 oz    BMI 28.21    Systolic 64 94 101  Diastolic 42 56 67  Pulse 71 70 79   He appears somnolent and acutely ill and pale. He is able to open his eyes when instructed and converses slowly.  He displays lucid thought and speech. He is diffusely pale. Cardiovascular: Regular rhythm and rate (rate approximately 80), no murmur. Lungs clear to auscultation bilaterally, breathing nonlabored. Abdomen is soft and nontender. Extremities are without any edema, erythema, or asymmetry. Neuro: Grossly intact.  Possibly some subtle decreased strength of extension of the left big toe.  Supine straight leg raise positive. Left knee without erythema, swelling, or tenderness.  Range of motion fully intact.  Pertinent labs/imaging Last CBC Lab Results  Component Value Date   WBC 5.7 03/17/2024   HGB 15.8 03/17/2024   HCT 46.8 03/17/2024   MCV 88.6 03/17/2024   MCH 29.9 03/17/2024   RDW 12.7 03/17/2024   PLT 157 03/17/2024   Last metabolic panel Lab Results  Component Value Date   GLUCOSE 114 (H) 03/17/2024   NA 136 03/17/2024   K 4.4 03/17/2024   CL 104 03/17/2024   CO2 26 03/17/2024   BUN 22 03/17/2024   CREATININE 1.51 (H) 03/17/2024   GFRNONAA 46 (L) 03/17/2024   CALCIUM  8.9 03/17/2024    PROT 6.3 (L) 03/17/2024   ALBUMIN 3.0 (L) 03/17/2024   BILITOT 0.7 03/17/2024   ALKPHOS 142 (H) 03/17/2024   AST 17 03/17/2024   ALT 21 03/17/2024   ANIONGAP 6 03/17/2024   Last lipids Lab Results  Component Value Date   CHOL 116 04/06/2023   HDL 42.20 04/06/2023   LDLCALC 45 04/06/2023   LDLDIRECT 103.0 06/22/2020   TRIG 145.0 04/06/2023   CHOLHDL 3 04/06/2023   Last hemoglobin A1c Lab Results  Component Value Date   HGBA1C 6.8 (A) 01/29/2024   ASSESSMENT/PLAN:  Acute symptomatic hypotension (systolic 65-75). Suspect he is slightly hypovolemic and this is complicated by continuing to take his Entresto  and Inspra . Glucose here today 149.  EMS called urgently for evaluation and transfer to the hospital.  Medical decision making of high complexity was utilized today.  FOLLOW UP:  TBD  Signed:  Gerlene Hockey, MD           03/20/2024

## 2024-03-20 NOTE — Transitions of Care (Post Inpatient/ED Visit) (Signed)
   03/20/2024  Name: Mason [name] MRN: [id number] DOB: [birth year]  Today's TOC FU Call Status: Today's TOC FU Call Status:: Unsuccessful Call (2nd Attempt) Unsuccessful Call (2nd Attempt) Date: 03/20/24  Attempted to reach the patient regarding the most recent Inpatient/ED visit.  Follow Up Plan: Additional outreach attempts will be made to reach the patient to complete the Transitions of Care (Post Inpatient/ED visit) call.  Signature Alan Ee, RN, BSN, [employer]- Transition of Care Team.  [facility] [phone]

## 2024-03-20 NOTE — ED Triage Notes (Signed)
 BIB [facility] for hypotension. 60 systolic at primary care office. Pale diaphoretic. 106 after 1L bolus.

## 2024-03-20 NOTE — Discharge Instructions (Addendum)
 It was a pleasure taking care of you today.  Based on your history, physical exam, and labs I feel you are safe for discharge. Today after receiving fluids from EMS you were no longer hypotensive.  The most likely cause of your hypotension at this time is reduced volume status due to reduced fluid intake.  Please make sure you are maintaining a healthy amount of food and fluid intake.  As far as your left knee pain and left hip pain is concerned, based off of the notes that I can see from your past hospital admission it looks like you will need to be cleared prior to having an MRI.  I recommend that you reach out to your primary care provider, orthopedics, as well as your cardiologist to make this happen.  Please return to the emergency department or seek further medical care if you experiencing the following symptoms including but not limited to low blood pressure, syncope, dizziness, severe pain, inability to walk.  Please follow-up with your primary care provider as soon as possible preferably within 48 hours for a postdischarge visit, please make them aware of your persistent left knee and left hip pain and the complications with getting an MRI.

## 2024-03-20 NOTE — Progress Notes (Signed)
 03/20/2024 Name: Mason [name] MRN: [id number] DOB: July 14, 1941  Chief Complaint  Patient presents with   Medication Management    Mason [name] is a 83 y.o. year old male who presented for a telephone visit.   They were referred to the pharmacist by their Case Management Team  for assistance in managing complex medication management.    Subjective:  [name] had back surgery 02/25/2024/ Had left-sided L2-3 and L3-4 retroperitoneal interbody decompression and fusion with posterior percutaneous fixation.   He has continued to have pain post surgery. He actually reports pain has been worse, affecting his sleep and still walking with a walker.   He has been seen 3 times recently in the ED and one admission 03/16/2024 8/11 - ED visit for drug induced constipation -  Meds prescribed at discharge: docusate 250mg  daily and Miralax  / polyethylene glycol 17 grams once daily   03/05/2024 - ED visit for acute on chronic low back pain;  Meds prescribed at discharge: methocarbamol  500mg  - take 2 tabs = 1000mg  every 6 hours as needed And methylprednisolone  4mg  dose pack for 6 days.   03/11/2024 - ED Visit for post operative pain Meds prescribed at discharge: prednisone  20mg  - take 2 tabs = 40mg  daily for 4 days and hydromorphone  2mg  - take 0.5 tablet = 1mg  every 6 hours as needed for severe pain   03/16/2024 to 03/18/2025 - Admission for intractable pain Medications stopped at discharge - metoprolol  ER 50mg  daily  Medications changed at discharge - lowered dose of Xarelto  from 20mg  to 15mg  daily (due to changes in renal function)    Care Team: Primary Care Provider: Candise Aleene DEL, MD ; Next Scheduled Visit: today at 3:40pm Cardiologist: Dr Francyne; Next Scheduled Visit: not currently scheduled Endocrinologist Dr Trixie; Next Scheduled Visit: 05/29/2024 Neurosurgeon: Dr Mason; Next Scheduled Visit: 03/27/2024  Medication Access/Adherence  Current Pharmacy:  [id number] -  [place], [place] - [address] [place] [address] Phone: [phone] Fax: [phone]  [facility] - [place], [place] - [address] [place] [address] Phone: [phone] Fax: [phone]  [facility] [place] (New Address) - [place], [place] - [address] AT Previously: Viviana Mulligan, [place] [address] [place] [address] Phone: [phone] Fax: [phone]   Patient reports affordability concerns with their medications: No  Patient reports access/transportation concerns to their pharmacy: No  Patient reports adherence concerns with their medications:  Yes  - some confusion about which medications he was to continue or restart after surgery.  Wife helps with medication management and administration.  Patient reports he is still experience significant pain - taking hydromorphone  0.5 tablet as needed. He states he helps some with pain. Also has taken acetaminophen  occasionally for pain and it helps a little.  His wife reports he did not finish his prescription for prednisone  because they did not see it on his most recent medication list.   Current diabetes regimen:  Lantus  28 units at bedtime  Novolog  12 units with meals Jardiance  10mg  daily.   He is still holding Ozempic . Per patient Dr Mason suggested he hold until Dr Trixie said it was OK to restart.  Home blood glucose readings per his wife have been 100 to 150 mostly. Hu uses Libre Continuous Glucose Monitor to check  blood glucose but I am not able to see if reports.   Patient is out of refills of the following medications - lidocaine  patches, omeprazole , sertraline  and Combigan  eye drops.   Takign Xarelto  for afib. Previously was taking 20mg  daily but was recently lowered to 15mg  daily. Suspect dose adjusted for renal changes.  Wt = 200  lbs / Ht = 5'11 / Scr = 1.51  Estimated Creatinine Clearance based on these measures is 49 mL/min - recommended to decreased dose to 15mg  daily if CrCl is < 50    Objective:  Lab Results  Component Value Date   HGBA1C 6.8 (A) 01/29/2024    Lab Results  Component Value Date   CREATININE 1.51 (H) 03/17/2024   BUN 22 03/17/2024   NA 136 03/17/2024   K 4.4 03/17/2024   CL 104 03/17/2024   CO2 26 03/17/2024    Lab Results  Component Value Date   CHOL 116 04/06/2023   HDL 42.20 04/06/2023   LDLCALC 45 04/06/2023   LDLDIRECT 103.0 06/22/2020   TRIG 145.0 04/06/2023   CHOLHDL 3 04/06/2023    Medications Reviewed Today     Reviewed by Carla Milling, RPH-CPP (Pharmacist) on 03/20/24 at 1149  Med List Status: <None>   Medication Order Taking? Sig Documenting Provider Last Dose Status Informant  chlorhexidine  (HIBICLENS ) 4 % external liquid [id number]  Apply 15 mLs (1 Application total) topically as directed for 30 doses. Use as directed daily for 5 days every other week for 6 weeks.  Patient not taking: Reported on 03/20/2024   Mason Shove, MD  Consider Medication Status and Discontinue (Completed Course) Self, Pharmacy Records  COMBIGAN  0.2-0.5 % ophthalmic solution [id number]  Place 1 drop into both eyes every 12 (twelve) hours.  Patient not taking: Reported on 03/20/2024   [provider]  Active Self, Pharmacy Records           Med Note ALAIN, [place] A   Fri Jun 02, 2016  1:39 PM)    ENTRESTO  97-103 MG [id number] Yes TAKE 1 TABLET TWO TIMES A DAY Croitoru, Mihai, MD  Active Self, Pharmacy Records  eplerenone  (INSPRA ) 25 MG tablet [id number] Yes TAKE 1 TABLET DAILY Croitoru, Mihai, MD  Active Self, Pharmacy Records  gabapentin  (NEURONTIN ) 300 MG capsule [id number] Yes TAKE 2 CAPSULES TWICE A DAY McGowen, Aleene DEL, MD  Active Self, Pharmacy Records  HYDROmorphone  (DILAUDID ) 2 MG tablet [id number] Yes Take 0.5 tablets (1 mg total) by mouth every 6 (six) hours as needed for  severe pain (pain score 7-10). Darci Pore, MD  Active   insulin  aspart (NOVOLOG  FLEXPEN) 100 UNIT/ML FlexPen [id number] Yes Inject 10 units before each meal  Patient taking differently: 12 Units 3 (three) times daily with meals.   Trixie File, MD  Active Self, Pharmacy Records  insulin  glargine (LANTUS  SOLOSTAR) 100 UNIT/ML Solostar Pen [id number] Yes Inject 30-34 Units into the skin daily.  Patient taking differently: Inject 28 Units into the skin daily.   Trixie File, MD  Active Self, Pharmacy Records  JARDIANCE  10 MG TABS tablet [id number] Yes TAKE 1 TABLET DAILY Trixie File, MD  Active Self, Pharmacy Records  latanoprost  (XALATAN ) 0.005 % ophthalmic solution [id number] Yes Place 1 drop into both eyes at bedtime. [provider]  Active Self, Pharmacy Records  lidocaine  (LIDODERM ) 5 % [id number] Yes PLACE 1 PATCH ON THE SKIN DAILY. REMOVE AND DISCARD PATCH WITHIN 12 HOURS OR AS DIRECTED BY DOCTOR McGowen, Aleene DEL, MD  Active Self, Pharmacy Records  meclizine  (ANTIVERT ) 25 MG tablet [id number] Yes TAKE 1 TABLET THREE TIMES A DAY AS NEEDED FOR DIZZINESS McGowen, Aleene DEL, MD  Active Self, Pharmacy Records           Med Note [place], [place] R   Tue Feb 12, 2024  1:31 PM) Takes on a scheduled basis  methocarbamol  (ROBAXIN ) 500 MG tablet [id number] Yes Take 2 tablets (1,000 mg total) by mouth every 6 (six) hours as needed for muscle spasms. Gennaro Duwaine CROME, DO  Active Self, Pharmacy Records  mupirocin  ointment (BACTROBAN ) 2 % [id number] Yes Place 1 Application into the nose 2 (two) times daily for 60 doses. Use as directed 2 times daily for 5 days every other week for 6 weeks. Mason Shove, MD  Active Self, Pharmacy Records  omeprazole  [facility]) 40 MG capsule [id number]  Take 40 mg by mouth daily.  Patient not taking: Reported on 03/20/2024   [provider]  Active Self, Pharmacy Records  oxybutynin  (DITROPAN -XL) 10 MG 24 hr tablet [id number] Yes Take 1  tablet (10 mg total) by mouth at bedtime. McGowen, Philip H, MD  Active Self, Pharmacy Records  polyethylene glycol (MIRALAX ) 17 g packet [id number] Yes Take 17 g by mouth daily. Mannie Fairy DASEN, DO  Active Self, Pharmacy Records  predniSONE  (DELTASONE ) 20 MG tablet [id number]  Take 2 tablets (40 mg total) by mouth daily with breakfast. For the next four days  Patient not taking: Reported on 03/20/2024   Garrick [place], MD  Active Self, Pharmacy Records  Rivaroxaban  (XARELTO ) 15 MG TABS tablet [id number] Yes Take 1 tablet (15 mg total) by mouth daily with supper. Darci Pore, MD  Active   rOPINIRole  (REQUIP ) 1 MG tablet [id number] Yes Take 1 tablet (1 mg total) by mouth at bedtime. McGowen, Philip H, MD  Active Self, Pharmacy Records  rosuvastatin  (CRESTOR ) 20 MG tablet [id number] Yes Take 20 mg by mouth at bedtime. [provider]  Active Self, Pharmacy Records  Semaglutide , 1 MG/DOSE, 4 MG/3ML SOPN [id number] Yes Inject 1 mg as directed once a week.  Patient taking differently: Inject 1 mg as directed once a week.   Trixie File, MD  Active Self, Pharmacy Records  sertraline  (ZOLOFT ) 100 MG tablet [id number] Yes TAKE 1 TABLET DAILY McGowen, Aleene DEL, MD  Active Self, Pharmacy Records              Assessment/Plan:   Medication Management / Polypharmacy:  - Reviewed medication list and updated - Patient to see Dr Candise today - recommend he ask for refills for lidocaine  patches, omeprazole  and sertraline .  - Also recommended he discuss with Dr McGowen if he should restart / complete Rx for prednisone  from ED visit. (He has 4 tabs = 2 days on hand)  - Sent message to Dr Trixie to see if she would recommend he restart Ozempic  or continue to hold.  - Requested pharmacy sent refill request to Dr Elner for Combigen eye drops.  - Will see neurosurgeon next week - continue to take hydormorphone as needed / use sparingly. He can also take acetaminophen  1000mg  up to 3 times  a day for pain. (Max of 3000mg  per day)   Follow Up Plan: 2 weeks.   Madelin Ray, PharmD Clinical Pharmacist [facility]  Population Health [phone]

## 2024-03-20 NOTE — ED Provider Notes (Signed)
  EMERGENCY DEPARTMENT AT [facility] Provider Note   CSN: [id number] Arrival date & time: 03/20/24  1710     Patient presents with: Hypotension   Mason [name] is a 83 y.o. male who presents to the emergency department via EMS due to hypotension.  Patient states that he was at his primary care office whenever his blood pressure was noted to be 60 systolic.  On EMS arrival patient was pale and diaphoretic.  Systolic blood pressure improved to 106 after a 1 L bolus of fluids via EMS and route.  Patient states that overall he just feels fatigued.  Denies chest pain, shortness of breath, abdominal pain.  He does however appreciate left knee and left hip pain which he states has been present ever since his back surgery on 02/25/2024.  Denies recent known sick contacts.  Patient does state he has lost close to 30 pounds this month.  Past medical history significant for dyslipidemia, complete heart block, type 2 diabetes, atrial fibrillation, hyperlipidemia, pacemaker placement, lumbar back pain with radiculopathy, chronic renal insufficiency, chronic systolic heart failure, TIA, GERD, thoracic aortic aneurysm, etc.  {Add pertinent medical, surgical, social history, OB history to HPI:32947} HPI     Prior to Admission medications   Medication Sig Start Date End Date Taking? Authorizing Provider  chlorhexidine  (HIBICLENS ) 4 % external liquid Apply 15 mLs (1 Application total) topically as directed for 30 doses. Use as directed daily for 5 days every other week for 6 weeks. Patient not taking: Reported on 03/20/2024 02/25/24   Louis Shove, MD  COMBIGAN  0.2-0.5 % ophthalmic solution Place 1 drop into both eyes every 12 (twelve) hours. Patient not taking: Reported on 03/20/2024 04/19/16   [provider]  ENTRESTO  97-103 MG TAKE 1 TABLET TWO TIMES A DAY 06/01/23   Croitoru, Mihai, MD  eplerenone  (INSPRA ) 25 MG tablet TAKE 1 TABLET DAILY 03/27/23   Croitoru, Mihai, MD  gabapentin   (NEURONTIN ) 300 MG capsule TAKE 2 CAPSULES TWICE A DAY 07/02/23   McGowen, Aleene DEL, MD  HYDROmorphone  (DILAUDID ) 2 MG tablet Take 0.5 tablets (1 mg total) by mouth every 6 (six) hours as needed for severe pain (pain score 7-10). 03/18/24   Darci Pore, MD  insulin  aspart (NOVOLOG  FLEXPEN) 100 UNIT/ML FlexPen Inject 10 units before each meal Patient taking differently: 12 Units 3 (three) times daily with meals. 03/07/23   Trixie File, MD  insulin  glargine (LANTUS  SOLOSTAR) 100 UNIT/ML Solostar Pen Inject 30-34 Units into the skin daily. Patient taking differently: Inject 28 Units into the skin daily. 01/29/24   Trixie File, MD  JARDIANCE  10 MG TABS tablet TAKE 1 TABLET DAILY 02/05/24   Trixie File, MD  latanoprost  (XALATAN ) 0.005 % ophthalmic solution Place 1 drop into both eyes at bedtime. 01/31/24   [provider]  lidocaine  (LIDODERM ) 5 % PLACE 1 PATCH ON THE SKIN DAILY. REMOVE AND DISCARD PATCH WITHIN 12 HOURS OR AS DIRECTED BY DOCTOR 10/19/23   McGowen, Aleene DEL, MD  meclizine  (ANTIVERT ) 25 MG tablet TAKE 1 TABLET THREE TIMES A DAY AS NEEDED FOR DIZZINESS 07/02/23   McGowen, Aleene DEL, MD  methocarbamol  (ROBAXIN ) 500 MG tablet Take 2 tablets (1,000 mg total) by mouth every 6 (six) hours as needed for muscle spasms. 03/05/24   Kammerer, Megan L, DO  mupirocin  ointment (BACTROBAN ) 2 % Place 1 Application into the nose 2 (two) times daily for 60 doses. Use as directed 2 times daily for 5 days every other week for  6 weeks. 02/25/24 03/26/24  Louis Shove, MD  omeprazole  (PRILOSEC) 40 MG capsule Take 40 mg by mouth daily. Patient not taking: Reported on 03/20/2024    [provider]  oxybutynin  (DITROPAN -XL) 10 MG 24 hr tablet Take 1 tablet (10 mg total) by mouth at bedtime. 02/12/24   McGowen, Aleene DEL, MD  polyethylene glycol (MIRALAX ) 17 g packet Take 17 g by mouth daily. 03/03/24   Mannie Fairy DASEN, DO  predniSONE  (DELTASONE ) 20 MG tablet Take 2 tablets (40 mg total)  by mouth daily with breakfast. For the next four days Patient not taking: Reported on 03/20/2024 03/11/24   Garrick Charleston, MD  Rivaroxaban  (XARELTO ) 15 MG TABS tablet Take 1 tablet (15 mg total) by mouth daily with supper. 03/18/24   Darci Pore, MD  rOPINIRole  (REQUIP ) 1 MG tablet Take 1 tablet (1 mg total) by mouth at bedtime. 02/20/24   McGowen, Aleene DEL, MD  rosuvastatin  (CRESTOR ) 20 MG tablet Take 20 mg by mouth at bedtime.    [provider]  Semaglutide , 1 MG/DOSE, 4 MG/3ML SOPN Inject 1 mg as directed once a week. Patient taking differently: Inject 1 mg as directed once a week. 10/11/23   Trixie File, MD  sertraline  (ZOLOFT ) 100 MG tablet TAKE 1 TABLET DAILY 08/21/23   McGowen, Aleene DEL, MD    Allergies: Patient has no known allergies.    Review of Systems  Constitutional:  Positive for diaphoresis.    Updated Vital Signs BP 113/74   Pulse 70   Temp 97.6 F (36.4 C) (Oral)   Resp 13   Ht 5' 11 (1.803 m)   Wt 92 kg   SpO2 100%   BMI 28.29 kg/m   Physical Exam Vitals and nursing note reviewed.  Constitutional:      General: He is awake. He is not in acute distress.    Appearance: Normal appearance. He is not ill-appearing, toxic-appearing or diaphoretic.     Comments: Patient overall well-appearing on initial exam, not diaphoretic, not pale appearing  HENT:     Head: Normocephalic and atraumatic.  Eyes:     General: No scleral icterus. Cardiovascular:     Rate and Rhythm: Normal rate and regular rhythm.  Pulmonary:     Effort: Pulmonary effort is normal. No respiratory distress.     Breath sounds: No wheezing, rhonchi or rales.  Musculoskeletal:        General: Normal range of motion.     Right lower leg: No edema.     Left lower leg: No edema.  Skin:    General: Skin is warm.     Capillary Refill: Capillary refill takes less than 2 seconds.  Neurological:     General: No focal deficit present.     Mental Status: He is alert and  oriented to person, place, and time.  Psychiatric:        Mood and Affect: Mood normal.        Behavior: Behavior normal. Behavior is cooperative.     (all labs ordered are listed, but only abnormal results are displayed) Labs Reviewed  CULTURE, BLOOD (ROUTINE X 2)  CULTURE, BLOOD (ROUTINE X 2)  RESP PANEL BY RT-PCR (RSV, FLU A&B, COVID)  RVPGX2  CBC WITH DIFFERENTIAL/PLATELET  COMPREHENSIVE METABOLIC PANEL WITH GFR  URINALYSIS, ROUTINE W REFLEX MICROSCOPIC  LIPASE, BLOOD  MAGNESIUM   I-STAT CG4 LACTIC ACID, ED    EKG: None  Radiology: No results found.  {Document cardiac monitor, telemetry assessment procedure when  appropriate:32947} Procedures   Medications Ordered in the ED - No data to display    {Click here for ABCD2, HEART and other calculators REFRESH Note before signing:1}                              Medical Decision Making Amount and/or Complexity of Data Reviewed Labs: ordered. Radiology: ordered.   Patient presents to the ED for concern of hypotension, this involves an extensive number of treatment options, and is a complaint that carries with it a high risk of complications and morbidity.  The differential diagnosis includes pacemaker issue, dehydration, malignancy, trauma/injury, hemorrhagic shock, sepsis, etc.   Co morbidities that complicate the patient evaluation  dyslipidemia, complete heart block, type 2 diabetes, atrial fibrillation, hyperlipidemia, pacemaker placement, lumbar back pain with radiculopathy, chronic renal insufficiency, chronic systolic heart failure, TIA, GERD, thoracic aortic aneurysm   Additional history obtained:  Additional history obtained from EMS   Lab Tests:  I Ordered, and personally interpreted labs.  The pertinent results include:     Imaging Studies ordered:  I ordered imaging studies including ***  I independently visualized and interpreted imaging which showed *** I agree with the radiologist  interpretation   Cardiac Monitoring:  The patient was maintained on a cardiac monitor.  I personally viewed and interpreted the cardiac monitored which showed an underlying rhythm of: ***   Medicines ordered and prescription drug management:  I ordered medication including ***  for ***  Reevaluation of the patient after these medicines showed that the patient {resolved/improved/worsened:23923::improved} I have reviewed the patients home medicines and have made adjustments as needed   Test Considered:  ***   Critical Interventions:  ***   Consultations Obtained:  I requested consultation with the ***,  and discussed lab and imaging findings as well as pertinent plan - they recommend: ***   Problem List / ED Course:  83 year old male, brought over from PCP office due to hypotension, given 1 L bolus of fluids by EMS, initial exam here patient well-appearing, not pale, not diaphoretic Patient denies chest pain, shortness of breath, abdominal pain, nausea, vomiting, patient states he has had good p.o. intake but overall just has a feeling of fatigue and not feeling well, significant weight loss this month Blood pressure now 116/79 Plan to obtain general lab work, chest x-ray, EKG and reassess   Reevaluation:  After the interventions noted above, I reevaluated the patient and found that they have :{resolved/improved/worsened:23923::improved}   Social Determinants of Health:  ***   Dispostion:  After consideration of the diagnostic results and the patients response to treatment, I feel that the patent would benefit from ***.    {Document critical care time when appropriate  Document review of labs and clinical decision tools ie CHADS2VASC2, etc  Document your independent review of radiology images and any outside records  Document your discussion with family members, caretakers and with consultants  Document social determinants of health affecting pt's care   Document your decision making why or why not admission, treatments were needed:32947:::1}   Final diagnoses:  None    ED Discharge Orders     None

## 2024-03-20 NOTE — ED Notes (Signed)
 Pt transferred from bed to bathroom by family. Denies dizziness

## 2024-03-21 ENCOUNTER — Telehealth: Payer: Self-pay | Admitting: *Deleted

## 2024-03-21 ENCOUNTER — Telehealth

## 2024-03-21 NOTE — Transitions of Care (Post Inpatient/ED Visit) (Signed)
 03/21/2024  Name: Mason [name] MRN: [id number] DOB: 1941-05-16  Today's TOC FU Call Status: Today's TOC FU Call Status:: Successful TOC FU Call Completed TOC FU Call Complete Date: 03/21/24 Patient's Name and Date of Birth confirmed.  Transition Care Management Follow-up Telephone Call Date of Discharge: 03/18/24 Discharge Facility: [name]) Type of Discharge: Inpatient Admission Primary Inpatient Discharge Diagnosis:: Intractable pain How have you been since you were released from the hospital?: Same Any questions or concerns?: No  Items Reviewed: Did you receive and understand the discharge instructions provided?: Yes (Patient received instructions, but has not taken the time to review) Medications obtained,verified, and reconciled?: Yes (Medications Reviewed) Any new allergies since your discharge?: No Dietary orders reviewed?: Yes Type of Diet Ordered:: low sodium, heart healthy Do you have support at home?: Yes People in Home [RPT]: spouse Name of Support/Comfort Primary Source: Spouse/Mason [name]  Medications Reviewed Today: Medications Reviewed Today     Reviewed by Mason [name] LABOR, RN (Registered Nurse) on 03/21/24 at 1343  Med List Status: <None>   Medication Order Taking? Sig Documenting Provider Last Dose Status Informant  acetaminophen  (TYLENOL ) 650 MG CR tablet [id number] Yes Take 650 mg by mouth every 8 (eight) hours as needed for pain. [provider]  Active   chlorhexidine  (HIBICLENS ) 4 % external liquid [id number]  Apply 15 mLs (1 Application total) topically as directed for 30 doses. Use as directed daily for 5 days every other week for 6 weeks.  Patient not taking: Reported on 03/21/2024   Mason Shove, MD  Active Self, Pharmacy Records  COMBIGAN  0.2-0.5 % ophthalmic solution [id number]  Place 1 drop into both eyes every 12 (twelve) hours.  Patient not taking: Reported on 03/20/2024   [provider]  Active Self, Pharmacy Records            Med Note Mason [name]   Fri Jun 02, 2016  1:39 PM)    ENTRESTO  97-103 MG [id number] Yes TAKE 1 TABLET TWO TIMES [name] DAY Croitoru, Mihai, MD  Active Self, Pharmacy Records  eplerenone  (INSPRA ) 25 MG tablet [id number] Yes TAKE 1 TABLET DAILY Croitoru, Mihai, MD  Active Self, Pharmacy Records  gabapentin  (NEURONTIN ) 300 MG capsule [id number] Yes TAKE 2 CAPSULES TWICE [name] DAY McGowen, Aleene DEL, MD  Active Self, Pharmacy Records  HYDROmorphone  (DILAUDID ) 2 MG tablet [id number] Yes Take 0.5 tablets (1 mg total) by mouth every 6 (six) hours as needed for severe pain (pain score 7-10). Mason Pore, MD  Active   insulin  aspart (NOVOLOG  FLEXPEN) 100 UNIT/ML FlexPen [id number] Yes Inject 10 units before each meal  Patient taking differently: 12 Units 3 (three) times daily with meals.   Mason File, MD  Active Self, Pharmacy Records  insulin  glargine (LANTUS  SOLOSTAR) 100 UNIT/ML Solostar Pen [id number] Yes Inject 30-34 Units into the skin daily.  Patient taking differently: Inject 28 Units into the skin daily.   Mason File, MD  Active Self, Pharmacy Records  JARDIANCE  10 MG TABS tablet [id number] Yes TAKE 1 TABLET DAILY Mason File, MD  Active Self, Pharmacy Records  latanoprost  (XALATAN ) 0.005 % ophthalmic solution [id number] Yes Place 1 drop into both eyes at bedtime. [provider]  Active Self, Pharmacy Records  lidocaine  (LIDODERM ) 5 % [id number] Yes PLACE 1 PATCH ON THE SKIN DAILY. REMOVE AND DISCARD PATCH WITHIN 12 HOURS OR AS DIRECTED BY DOCTOR McGowen, Aleene DEL, MD  Active Self, Pharmacy Records  meclizine  (ANTIVERT ) 25 MG tablet [id number] Yes TAKE 1 TABLET THREE  TIMES [name] DAY AS NEEDED FOR DIZZINESS McGowen, Aleene DEL, MD  Active   methocarbamol  (ROBAXIN ) 500 MG tablet [id number] Yes Take 2 tablets (1,000 mg total) by mouth every 6 (six) hours as needed for muscle spasms. Mason Duwaine CROME, DO  Active Self, Pharmacy Records  mupirocin  ointment (BACTROBAN ) 2 % [id number] Yes  Place 1 Application into the nose 2 (two) times daily for 60 doses. Use as directed 2 times daily for 5 days every other week for 6 weeks. Mason Shove, MD  Active Self, Pharmacy Records  omeprazole  [facility]) 40 MG capsule [id number] Yes Take 40 mg by mouth daily. [provider]  Active Self, Pharmacy Records  oxybutynin  (DITROPAN -XL) 10 MG 24 hr tablet [id number] Yes Take 1 tablet (10 mg total) by mouth at bedtime. McGowen, Philip H, MD  Active Self, Pharmacy Records  polyethylene glycol (MIRALAX ) 17 g packet [id number] Yes Take 17 g by mouth daily. Mason Fairy DASEN, DO  Active Self, Pharmacy Records  predniSONE  (DELTASONE ) 20 MG tablet [id number]  Take 2 tablets (40 mg total) by mouth daily with breakfast. For the next four days  Patient not taking: Reported on 03/21/2024   Mason Charleston, MD  Active Self, Pharmacy Records  Rivaroxaban  (XARELTO ) 15 MG TABS tablet [id number] Yes Take 1 tablet (15 mg total) by mouth daily with supper. Mason Pore, MD  Active   rOPINIRole  (REQUIP ) 1 MG tablet [id number] Yes Take 1 tablet (1 mg total) by mouth at bedtime. McGowen, Philip H, MD  Active Self, Pharmacy Records  rosuvastatin  (CRESTOR ) 20 MG tablet [id number] Yes Take 20 mg by mouth at bedtime. [provider]  Active Self, Pharmacy Records  Semaglutide , 1 MG/DOSE, 4 MG/3ML SOPN [id number]  Inject 1 mg as directed once [name] week.  Patient not taking: Reported on 03/21/2024   Mason File, MD  Active Self, Pharmacy Records  sertraline  (ZOLOFT ) 100 MG tablet [id number] Yes TAKE 1 TABLET DAILY McGowen, Aleene DEL, MD  Active Self, Pharmacy Records            Home Care and Equipment/Supplies: Were Home Health Services Ordered?: Yes Name of Home Health Agency:: [name] for PT/OT Has Agency set up [name] time to come to your home?: Yes First Home Health Visit Date: 03/22/24 Any new equipment or medical supplies ordered?: No  Functional Questionnaire: Do you need assistance with  bathing/showering or dressing?: Yes (spouse assists) Do you need assistance with meal preparation?: Yes Do you need assistance with eating?: No Do you have difficulty maintaining continence: No Do you need assistance with getting out of bed/getting out of [name] chair/moving?: Yes (Mason [name] assists) Do you have difficulty managing or taking your medications?: Yes (Mason [name] manages medications)  Follow up appointments reviewed: PCP Follow-up appointment confirmed?: Yes Date of PCP follow-up appointment?: 03/20/24 Follow-up Provider: Dr. Candise Specialist [facility] Follow-up appointment confirmed?: NA Do you need transportation to your follow-up appointment?: No Do you understand care options if your condition(s) worsen?: Yes-patient verbalized understanding  SDOH Interventions Today    Flowsheet Row Most Recent Value  SDOH Interventions   Food Insecurity Interventions Intervention Not Indicated  Housing Interventions Intervention Not Indicated  Transportation Interventions Intervention Not Indicated  Utilities Interventions Intervention Not Indicated    Goals Addressed             This Visit's Progress    VBCI Transitions of Care (TOC) Care Plan       Problems:  Recent Hospitalization for treatment of Intractable pain Knowledge Deficit Related to pain management  Goal:  Over the next 30 days, the patient will not experience hospital readmission  Interventions:  Transitions of Care: Durable Medical Equipment (DME) reviewed with patient/caregiver Doctor Visits  - discussed the importance of doctor visits Reviewed Signs and symptoms of infection Discussed [facility] is scheduled for [facility] on 03/22/24 Reviewed pain management, advised to use heat and ice intermittently, take medications as prescribed Advised patient to check BP and record Discussed keeping [name] journal with medical information and provider names Advised patient to follow up with PCP, Orthopedic and Cardiology Discussed the  importance of hydration   Patient Self Care Activities:  Attend all scheduled provider appointments Call pharmacy for medication refills 3-7 days in advance of running out of medications Call provider office for new concerns or questions  Notify RN Care Manager of [facility] call rescheduling needs Participate in Transition of Care Program/Attend [facility] scheduled calls Take medications as prescribed    Plan:  Telephone follow up appointment with care management team member scheduled for:  03/28/24 at 11 am with Alan, RN        [name] Dimes RN, BSN [place]  [facility] RN Care Manager [phone]

## 2024-03-22 DIAGNOSIS — Z4789 Encounter for other orthopedic aftercare: Secondary | ICD-10-CM | POA: Diagnosis not present

## 2024-03-22 DIAGNOSIS — Z981 Arthrodesis status: Secondary | ICD-10-CM | POA: Diagnosis not present

## 2024-03-25 ENCOUNTER — Other Ambulatory Visit: Payer: Self-pay | Admitting: Family Medicine

## 2024-03-25 ENCOUNTER — Ambulatory Visit (INDEPENDENT_AMBULATORY_CARE_PROVIDER_SITE_OTHER): Admitting: Family Medicine

## 2024-03-25 ENCOUNTER — Encounter: Payer: Self-pay | Admitting: Cardiovascular Disease

## 2024-03-25 ENCOUNTER — Encounter: Payer: Self-pay | Admitting: Family Medicine

## 2024-03-25 VITALS — BP 90/62 | HR 81 | Temp 97.6°F | Ht 71.0 in | Wt 201.8 lb

## 2024-03-25 DIAGNOSIS — M25562 Pain in left knee: Secondary | ICD-10-CM | POA: Diagnosis not present

## 2024-03-25 DIAGNOSIS — M899 Disorder of bone, unspecified: Secondary | ICD-10-CM

## 2024-03-25 DIAGNOSIS — M5414 Radiculopathy, thoracic region: Secondary | ICD-10-CM

## 2024-03-25 LAB — CULTURE, BLOOD (ROUTINE X 2): Culture: NO GROWTH

## 2024-03-25 MED ORDER — OMEPRAZOLE 40 MG PO CPDR: 40.0000 mg | DELAYED_RELEASE_CAPSULE | Freq: Every day | ORAL | 3 refills | Status: DC

## 2024-03-25 NOTE — Progress Notes (Signed)
 OFFICE VISIT  03/25/2024  CC:  Chief Complaint  Patient presents with   Hospitalization Follow-up    Patient is a 83 y.o. male who presents accompanied by a family member for emergency department follow-up.  I reviewed all encounter data from that visit today. He was seen in the emergency department 03/20/2024 after being sent there from our office for hypotension.  He was given IV fluids and improved.  INTERIM HX: He feels better, just pretty fatigued.  No dizziness/lightheadedness.  He is drinking fluids well. Has ongoing pain in the distal femur and in the medial aspect of the knee. No knee swelling or redness or warmth. He says his back hurts on and off.  He has lost approximately 18 to 20 pounds over the last 2 months. No fevers.  Past Medical History:  Diagnosis Date   AICD (automatic cardioverter/defibrillator) present 2018   MDT CRT-D.  Fatigue-->completely pacer dependent.  Pacer settings adjusted 12/2017 to allow more chronotrophc variance with ADL's//exertion.   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   02/2016 EF 40%, diffuse hypokinesis, grade 2 DD.  Myoc perf imaging showed EF 32% 04/2016.  Pt upgraded to CRT-D 01/04/17.   Chronic renal insufficiency, stage III (moderate) (HCC) 2015   CrCl about 60 ml/min   Complete heart block (HCC)    Has dual chamber pacer.   COVID-19 virus infection 01/05/2021   paxlovid   Depression    DOE (dyspnea on exertion)    NYHA class II/III CHF   Dyspnea 2021   with exertion, bending over   Dysrhythmia    A. Fib   Episodic low back pain 01/22/2013   w/intermittent radiculitis (12/2014 his neurologist  referred him to pain mgmt for epidural steroid injection)   Erectile dysfunction 2019   due to zoloft --urol rx'd viagra   GERD (gastroesophageal reflux disease)    H/O tilt table evaluation 11/02/2005   negative   Helicobacter pylori gastritis 01/2016   History of adenomatous polyp of colon 10/12/2011   Dr. Rollin (3 right side of colon- tubular adenomas removed)   History of cardiovascular stress test 05/28/2012   no ischemia, EF 37%, imaging results are unchanged and within normal variance   History of chronic prostatitis    History of kidney stones    History of vertigo    + Hx of posterior HA's.  Neuro (Dr. Maurice) eval 2011.  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   Hyperlipidemia    Hypertension    Lumbar spondylosis    lumbosacral radiculopathy at L4 by EMG testing, right foot drop (neurologist is Dr. Birder with [facility] in [place])--neurologist referred him to neurosurgery   Migraine    used to have them all the time; none for years (01/04/2017)   Myocardial infarction [facility]) ?1970s   not entirely certain of this   Nephrolithiasis 07/2012   Left UVJ 2 mm stone with dilation of renal collecting system and slight hydroureter on right   Neuropathy    NICM (nonischemic cardiomyopathy) (HCC)    a. 02/2018 Cath: LM nl, LAD min irregs, LCX no, RCA 20d. [id number].  Fick CO/CI 4.4/2.0.   Osteoarthritis, multiple sites    Shoulders, back, knees   Pacemaker 02/05/2012   dual chamber, complete heart block, meddtronic revo, lasted checked 12/2015.  Since no CAD on cath 05/2016, cards recommends upgrade to CRT-D.   Peripheral vascular disease (HCC)    Peripheral Artery Disease   Permanent atrial fibrillation (HCC)    DCCV 07/09/13-converted, lasted two days, then back into afib--needs lifetime anticoagulation (Xarelto  as of 09/2014)   Prostate cancer screening 09/2017   done by urol annually (normal  prostate exam documented + PSA 0.84 as of 10/01/17 urol f/u.  10/2018 urol f/u PSA 0.6, no prostate nodule.   Rectus diastasis    Right ankle sprain 08/2017   w/distal fibula avulsion fx noted on u/s but not plain film-(Dr. Hudnall).   Skin cancer of arm, left    burned it off (01/04/2017)   TIA (transient ischemic attack)    L face and L arm weakness. Peri procedural->a. 03/22/2018 following cath. CT head neg. No MRI b/c has pacer. Likely due to embolus to distal branch of RMCA   Type II diabetes mellitus (HCC)     Past Surgical History:  Procedure Laterality Date   ABI's Bilateral 05/21/2018   normal   ANTERIOR LATERAL LUMBAR FUSION WITH PERCUTANEOUS SCREW 2 LEVEL Left 02/25/2024   Procedure: LEFT ANTERIOR LATERAL LUMBAR FUSION WITH PERCUTANEOUS PEDICLE SCREW LUMBAR TWO-LUMBAR THREE, LUMBAR THREE-LUMBAR FOUR, POSTERIOR LATERAL AND INTERBODY FUSION;  Surgeon: Louis Shove, MD;  Location: [facility] OR;  Service: Neurosurgery;  Laterality: Left;  XLIF - left - L2-L3 - L3-L4 with perc pedicle screws - Posterior Lateral and Interbody fusion   BACK SURGERY     BIV ICD INSERTION CRT-D N/A 01/04/2017   Procedure: BiV ICD ;  Surgeon: Inocencio Soyla Lunger, MD;  Location: [facility] INVASIVE CV LAB;  Service: Cardiovascular;  Laterality: N/A;   CARDIAC CATHETERIZATION N/A 06/14/2016   Minimal nonobstructive dz, EF 25-35%.  Procedure: Left Heart Cath and Coronary Angiography;  Surgeon: Peter M Swaziland, MD;  Location: [facility] INVASIVE CV LAB;  Service: Cardiovascular;  Laterality: N/A;   CARDIOVASCULAR STRESS TEST  2012   2012 nuclear perfusion study: low risk scan; 04/2016 normal myocardial perfusion imaging, EF 32%.   CARDIOVERSION  07/09/2012   Procedure: CARDIOVERSION;  Surgeon: Jerel Balding, MD;  Location: [facility] ENDOSCOPY;  Service: Cardiovascular;  Laterality: N/A;   CATARACT EXTRACTION W/ INTRAOCULAR LENS IMPLANT & ANTERIOR VITRECTOMY, BILATERAL Bilateral    CIRCUMCISION N/A 03/23/2020   Procedure: CIRCUMCISION ADULT;   Surgeon: Rosalind Zachary NOVAK, MD;  Location: [facility] ORS;  Service: Urology;  Laterality: N/A;   COLONOSCOPY W/ POLYPECTOMY  approx 2006; repeated 09/2011   Polyps on 2013 EGD as well, repeat 12/2014   COLONOSCOPY WITH PROPOFOL  N/A 07/08/2021   adenoma x 1. Procedure: COLONOSCOPY WITH PROPOFOL ;  Surgeon: Rollin Dover, MD;  Location: [facility] ENDOSCOPY;  Service: Endoscopy;  Laterality: N/A;   ESOPHAGEAL DILATION  10/19/2023   Procedure: DILATION, ESOPHAGUS;  Surgeon: Rollin Dover, MD;  Location: [facility] ENDOSCOPY;  Service: Gastroenterology;;   ESOPHAGOGASTRODUODENOSCOPY  10/18/2006   Done due to chronic GERD: Normal, bx showed no barrett's esophagus (Dr. Rollin)   EYE SURGERY  01/08/2023   FLEXOR TENDON REPAIR Left 10/02/2016   Procedure: LEFT RING FINGER WOUND EXPORATION AND FLEXOR TENDON REPAIR AND NERVE REPAIR;  Surgeon: Alm Hummer, MD;  Location: [facility] OR;  Service: Orthopedics;  Laterality: Left;   INSERT / REPLACE / REMOVE PACEMAKER  02/05/2012   dual chamber, sinus  node dysfunction, sinus arrest, PAF, Medtronic Revo serial#-[id number] H: last checked 05/2015   LUMBAR LAMINECTOMY Left 1976   L4-5   PACEMAKER REMOVAL  01/04/2017   PERMANENT PACEMAKER INSERTION N/A 02/05/2012   Procedure: PERMANENT PACEMAKER INSERTION;  Surgeon: Jerel Balding, MD; Generator Medtronic [place] model [place] serial number [id number] H Laterality: N/A;   POLYPECTOMY  07/08/2021   Procedure: POLYPECTOMY;  Surgeon: Rollin Dover, MD;  Location: [facility] END[facility];  Service: Endoscopy;;   RETINAL DETACHMENT SURGERY Left ~ 1999   REVERSE SHOULDER ARTHROPLASTY Left 2018   Left shoulder reverse [facility] in [place]).   RIGHT/LEFT HEART CATH AND CORONARY ANGIOGRAPHY N/A 03/22/2018   EF 30-35%, no CAD.  Procedure: RIGHT/LEFT HEART CATH AND CORONARY ANGIOGRAPHY;  Surgeon: Cherrie Toribio SAUNDERS, MD;  Location: [facility] INVASIVE CV LAB;  Service: Cardiovascular;  Laterality: N/A;   SAVORY DILATION N/A 10/19/2023   Procedure: EGD, WITH DILATION  USING SAVARY-GILLIARD DILATOR OVER GUIDEWIRE;  Surgeon: Rollin Dover, MD;  Location: [facility];  Service: Gastroenterology;  Laterality: N/A;   TRANSTHORACIC ECHOCARDIOGRAM  08/25/10; 05/2012; 03/23/16;12/2017   mild asymmetric LVH, normal systolic function, normal diastolic fxn, mild-to-mod mitral regurg, mild aortic valve sclerosis and trace AI, mild aortic root dilatation. 2014 f/u showed EF 40-45%, mod LAE, A FIB.  02/2016 EF 40%, diffuse hypokinesis, grade 2 DD. 12/2017 EF 35-40%,diffuse hypokin,grd III DD, mild MR   WISDOM TOOTH EXTRACTION  06/27/2021   EF 50-55%, mild aortic root/ascending aorta dilatation (41-42 mm).    Outpatient Medications Prior to Visit  Medication Sig Dispense Refill   acetaminophen  (TYLENOL ) 650 MG CR tablet Take 650 mg by mouth every 8 (eight) hours as needed for pain.     COMBIGAN  0.2-0.5 % ophthalmic solution Place 1 drop into both eyes every 12 (twelve) hours.     ENTRESTO  97-103 MG TAKE 1 TABLET TWO TIMES A DAY 180 tablet 3   eplerenone  (INSPRA ) 25 MG tablet TAKE 1 TABLET DAILY 90 tablet 3   gabapentin  (NEURONTIN ) 300 MG capsule TAKE 2 CAPSULES TWICE A DAY 360 capsule 3   HYDROmorphone  (DILAUDID ) 2 MG tablet Take 0.5 tablets (1 mg total) by mouth every 6 (six) hours as needed for severe pain (pain score 7-10). 13 tablet 0   insulin  aspart (NOVOLOG  FLEXPEN) 100 UNIT/ML FlexPen Inject 10 units before each meal (Patient taking differently: 12 Units 3 (three) times daily with meals.) 30 mL 3   insulin  glargine (LANTUS  SOLOSTAR) 100 UNIT/ML Solostar Pen Inject 30-34 Units into the skin daily. (Patient taking differently: Inject 28 Units into the skin daily.)     JARDIANCE  10 MG TABS tablet TAKE 1 TABLET DAILY 90 tablet 3   latanoprost  (XALATAN ) 0.005 % ophthalmic solution Place 1 drop into both eyes at bedtime.     lidocaine  (LIDODERM ) 5 % PLACE 1 PATCH ON THE SKIN DAILY. REMOVE AND DISCARD PATCH WITHIN 12 HOURS OR AS DIRECTED BY DOCTOR 90 patch 0   meclizine   (ANTIVERT ) 25 MG tablet TAKE 1 TABLET THREE TIMES A DAY AS NEEDED FOR DIZZINESS 270 tablet 0   methocarbamol  (ROBAXIN ) 500 MG tablet Take 2 tablets (1,000 mg total) by mouth every 6 (six) hours as needed for muscle spasms. 56 tablet 0   mupirocin  ointment (BACTROBAN ) 2 % Place 1 Application into the nose 2 (two) times daily for 60 doses. Use as directed 2 times daily for 5 days every other week for 6 weeks. 60 g 0   oxybutynin  (DITROPAN -XL) 10 MG 24 hr tablet Take 1 tablet (  10 mg total) by mouth at bedtime. 90 tablet 0   polyethylene glycol (MIRALAX ) 17 g packet Take 17 g by mouth daily. 14 each 0   Rivaroxaban  (XARELTO ) 15 MG TABS tablet Take 1 tablet (15 mg total) by mouth daily with supper. 90 tablet 0   rOPINIRole  (REQUIP ) 1 MG tablet Take 1 tablet (1 mg total) by mouth at bedtime. 90 tablet 3   rosuvastatin  (CRESTOR ) 20 MG tablet Take 20 mg by mouth at bedtime.     sertraline  (ZOLOFT ) 100 MG tablet TAKE 1 TABLET DAILY 90 tablet 0   omeprazole  (PRILOSEC) 40 MG capsule Take 40 mg by mouth daily.     Semaglutide , 1 MG/DOSE, 4 MG/3ML SOPN Inject 1 mg as directed once a week. (Patient not taking: Reported on 03/25/2024) 9 mL 3   chlorhexidine  (HIBICLENS ) 4 % external liquid Apply 15 mLs (1 Application total) topically as directed for 30 doses. Use as directed daily for 5 days every other week for 6 weeks. (Patient not taking: Reported on 03/21/2024) 946 mL 1   predniSONE  (DELTASONE ) 20 MG tablet Take 2 tablets (40 mg total) by mouth daily with breakfast. For the next four days (Patient not taking: Reported on 03/21/2024) 8 tablet 0   No facility-administered medications prior to visit.    No Known Allergies  Review of Systems As per HPI  PE:    03/25/2024   11:07 AM 03/20/2024    8:45 PM 03/20/2024    8:30 PM  Vitals with BMI  Height 5' 11    Weight 201 lbs 13 oz    BMI 28.16    Systolic 90 122 127  Diastolic 62 95 83  Pulse 81 69 69    Physical Exam  General: Alert, tired appearing  but in no distress.  He is oriented x 4, has lucid thought and speech, pleasant affect. Left leg is without any erythema, warmth, or swelling.  He has some tenderness to palpation over the distal femur mostly anteromedially and this extends over the medial aspect of the knee. No palpable soft tissue abnormality in the thigh. Bedside MSK ultrasound today: The thigh has no sign of hematoma or soft tissue mass.  I cannot clearly discern any cortical defect. No knee joint effusion.  Joint space narrowing medially.  Punctate hyperechoic changes in the medial meniscus consistent with chondrocalcinosis.  No hyperemia on PDI.  LABS:  Last CBC Lab Results  Component Value Date   WBC 5.8 03/20/2024   HGB 14.1 03/20/2024   HCT 42.5 03/20/2024   MCV 89.1 03/20/2024   MCH 29.6 03/20/2024   RDW 12.6 03/20/2024   PLT 160 03/20/2024   Last metabolic panel Lab Results  Component Value Date   GLUCOSE 125 (H) 03/20/2024   NA 136 03/20/2024   K 4.0 03/20/2024   CL 103 03/20/2024   CO2 22 03/20/2024   BUN 18 03/20/2024   CREATININE 1.40 (H) 03/20/2024   GFRNONAA 50 (L) 03/20/2024   CALCIUM  8.5 (L) 03/20/2024   PROT 6.1 (L) 03/20/2024   ALBUMIN 3.1 (L) 03/20/2024   BILITOT 0.6 03/20/2024   ALKPHOS 122 03/20/2024   AST 32 03/20/2024   ALT 25 03/20/2024   ANIONGAP 11 03/20/2024   Lab Results  Component Value Date   ESRSEDRATE 22 (H) 06/09/2021   Lab Results  Component Value Date   CRP <0.5 03/16/2024   IMPRESSION AND PLAN:  #1 hypotension. Currently asymptomatic.  Today's blood pressure here is pretty consistent with his  baseline.  Okay to continue Inspra  25 mg a day and Entresto  97-103 1 tab twice daily.  Encouraged adequate oral hydration.  2.  Acute left knee pain. Unknown etiology.  Not clearly radicular. Also not clearly attributable to the abnormalities noted on his 03/03/24 plain films (patellofemoral joint degenerative changes and a sclerotic ill-defined lesion along the medial  cortex of the distal femoral diaphysis.) and his 03/11/24 CT of the knee (sclerotic lesion of distal femoral metadiaphysis, nonspecific). 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  He is NOT currently a candidate for MRI under [facility]'s current protocols.   He was seen in the hospital by orthopedic surgeon, Dr. Ozell Ada. Outpatient follow-up was recommended so I have ordered a referral to him today.  I personally spent a total of 33  minutes in the care of the patient today including preparing to see the patient, getting/reviewing separately obtained history, performing a medically appropriate exam/evaluation, placing orders, and documenting clinical information in the EHR.  An After Visit Summary was printed and given to the patient.  FOLLOW UP: Return in about 3 weeks (around 04/15/2024) for f/u bp and L knee pain.  Signed:  Gerlene Hockey, MD           03/25/2024

## 2024-03-26 DIAGNOSIS — Z981 Arthrodesis status: Secondary | ICD-10-CM | POA: Diagnosis not present

## 2024-03-26 DIAGNOSIS — Z4789 Encounter for other orthopedic aftercare: Secondary | ICD-10-CM | POA: Diagnosis not present

## 2024-03-26 NOTE — Telephone Encounter (Signed)
 Left message and callback number. Sent mychart message as well

## 2024-03-27 ENCOUNTER — Other Ambulatory Visit (HOSPITAL_COMMUNITY): Payer: Self-pay | Admitting: Neurosurgery

## 2024-03-27 ENCOUNTER — Telehealth: Payer: Self-pay

## 2024-03-27 DIAGNOSIS — M4310 Spondylolisthesis, site unspecified: Secondary | ICD-10-CM

## 2024-03-27 NOTE — Telephone Encounter (Signed)
 Reason for CRM: Polly Ras calling from home health care pt care Delayed 8/30. They are calling to let provider know.  Call back number if needed for miss Mason [name] [phone]

## 2024-03-27 NOTE — Telephone Encounter (Signed)
 FYI reviewed. Sending to provider as FYI

## 2024-03-28 ENCOUNTER — Encounter: Payer: Self-pay | Admitting: Internal Medicine

## 2024-03-28 ENCOUNTER — Telehealth

## 2024-03-28 ENCOUNTER — Ambulatory Visit (INDEPENDENT_AMBULATORY_CARE_PROVIDER_SITE_OTHER): Admitting: Internal Medicine

## 2024-03-28 ENCOUNTER — Telehealth: Payer: Self-pay

## 2024-03-28 VITALS — BP 120/68 | HR 87 | Ht 71.0 in | Wt 207.0 lb

## 2024-03-28 DIAGNOSIS — E785 Hyperlipidemia, unspecified: Secondary | ICD-10-CM | POA: Diagnosis not present

## 2024-03-28 DIAGNOSIS — E1165 Type 2 diabetes mellitus with hyperglycemia: Secondary | ICD-10-CM | POA: Diagnosis not present

## 2024-03-28 DIAGNOSIS — Z981 Arthrodesis status: Secondary | ICD-10-CM | POA: Diagnosis not present

## 2024-03-28 DIAGNOSIS — E1159 Type 2 diabetes mellitus with other circulatory complications: Secondary | ICD-10-CM

## 2024-03-28 DIAGNOSIS — Z4789 Encounter for other orthopedic aftercare: Secondary | ICD-10-CM | POA: Diagnosis not present

## 2024-03-28 DIAGNOSIS — Z794 Long term (current) use of insulin: Secondary | ICD-10-CM | POA: Diagnosis not present

## 2024-03-28 LAB — POCT GLYCOSYLATED HEMOGLOBIN (HGB A1C): Hemoglobin A1C: 7.9 % — AB (ref 4.0–5.6)

## 2024-03-28 NOTE — Patient Instructions (Addendum)
 Please continue: - Jardiance  10 mg daily before b'fast - Lantus  28 units at bedtime - NovoLog  15 min before meals: 10-12 units before brunch 8-10 units before dinner If you have to take the suppertime insulin  after the meal, take only up to 5 units.  Try to stop the snack at night.  Please return in 2 months.

## 2024-03-28 NOTE — Progress Notes (Signed)
 Patient ID: Mason [name], male   DOB: Feb 14, 1941, 83 y.o.   MRN: [id number]   HPI: Mason [name] is a 83 y.o.-year-old male, initially referred by his PCP, Dr. Candise, returning for follow-up for DM2, dx in ~2011, insulin -dependent since 2019, uncontrolled, with long-term complications (CAD with ?h/o AMI, nonischemic CMP, CHF, Afib, cerebrovascular disease with h/o TIA, CKD stage III, peripheral neuropathy, ED).  Last visit 4 months ago.    Interim history: No increased urination, nausea, chest pain.   He has back pain, previously had steroid injections.  Since last visit, he had lumbar fusion on 02/25/2024.  After this, he developed significant pain and also drug-induced constipation.  He is still off his Ozempic , which he stopped before the surgery.  He continues on opioids.  He has L leg pain >> will see ortho. He relaxed his diet recently, and also misses many insulin  injections.  Reviewed HbA1c levels:  Lab Results  Component Value Date   HGBA1C 6.8 (A) 01/29/2024   HGBA1C 7.1 (A) 10/11/2023   HGBA1C 6.6 (A) 06/11/2023   HGBA1C 7.1 (A) 02/08/2023   HGBA1C 7.1 (A) 09/29/2022   HGBA1C 6.6 (A) 05/31/2022   HGBA1C 7.3 (A) 01/26/2022   HGBA1C 7.2 (A) 09/08/2021   HGBA1C 6.7 (A) 12/09/2020   HGBA1C 7.6 (A) 09/09/2020   HGBA1C 9.7 (H) 06/22/2020   HGBA1C 9.0 (H) 03/23/2020   HGBA1C 9.2 (H) 03/16/2020   HGBA1C 9.9 (H) 11/20/2019   HGBA1C 9.7 (H) 05/27/2019   HGBA1C 9.0 (H) 02/26/2019   HGBA1C 10.1 (H) 11/29/2018   HGBA1C 9.0 (A) 08/29/2018   HGBA1C 9.0 08/29/2018   HGBA1C 9.0 (A) 08/29/2018   HGBA1C 9.0 (A) 08/29/2018   HGBA1C 7.8 (A) 05/14/2018   HGBA1C 8.1 (H) 02/06/2018   HGBA1C 8.0 (H) 10/26/2017   HGBA1C 8.2 (H) 07/27/2017   HGBA1C 7.0 03/01/2017   HGBA1C 7.1 11/16/2016   HGBA1C 7.8 (H) 07/03/2016   HGBA1C 6.8 03/14/2016   HGBA1C 7.0 (H) 12/09/2015   He was previously on: - Jardiance  10 mg before breakfast - Trulicity  1.5 >> 3  mg weekly - NovoLog  pens :  14-18 >> 18-20  >> 15-16 >> 15 >> 10 >> but taking 12 units before brunch 8-10 units before dinner >> 0 units >> 7-8 >> but taking 10 units before a larger dinner  If you have a snack at night, you may need ~5 units before the snack - Lantus  vial 44 >> 40 >> 36-40 >> 42 >> 45 >> 40 >> but taking 42 units at bedtime She was on metformin  in the past.  Now on: - Jardiance  10 mg before breakfast - Ozempic  1 mg weekly - Lantus  36 >> 42 >> 38 >> 34 >> 32 >> 30 >> 28 units at bedtime-misses doses - NovoLog  15 min before meals:-Misses doses 8-10 >> 12 units before brunch 6-8 >> 10 units before dinner If you have to take the suppertime insulin  after the meal, take only up to 5 units. He was previously on Trulicity .  Pt checks his sugars more than 4 times a day with his freestyle libre CGM:  Prev.:  Previously:   Lowest sugar was: 45 >> 48; he has hypoglycemia awareness at 70.  Highest sugar was 400 >> ...  250 >> 300s  Glucometer: Freestyle  Pt's meals are: - Breakfast: cereals, eggs, grits, toast - Lunch: may skip or sandwich - Dinner: meat + veggies + starch - Snacks: apple/cheese >> bananasplits at night Patient saw  nutrition a long time ago.  -+ Stage III CKD, last BUN/creatinine:  Lab Results  Component Value Date   BUN 18 03/20/2024   BUN 22 03/17/2024   CREATININE 1.40 (H) 03/20/2024   CREATININE 1.51 (H) 03/17/2024    No results found for: MICRALBCREAT On Entresto .  -+ HL; last set of lipids: Lab Results  Component Value Date   CHOL 116 04/06/2023   HDL 42.20 04/06/2023   LDLCALC 45 04/06/2023   LDLDIRECT 103.0 06/22/2020   TRIG 145.0 04/06/2023   CHOLHDL 3 04/06/2023  Previously on Zocor  20, but changed to Crestor  20 mg daily.  - last eye exam was 2025:+ DR. He also has glaucoma. Dr. Octavia and Dr. Elner.  - + numbness and tingling in his feet.  On Neurontin  300 mg twice a day.   Seeing podiatry - Dr. Joshua at the [facility].  Latest foot exam 11/22/2023.  No known FH of  DM.  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  Drinks beer 2-3x a day, but not quite every day.  ROS: + see HPI  I reviewed pt's medications, allergies, PMH, social hx, family hx, and changes were documented in the history of present illness. Otherwise, unchanged from my initial visit note.  Past Medical History:  Diagnosis Date   AICD (automatic cardioverter/defibrillator) present 2018   MDT CRT-D.  Fatigue-->completely pacer dependent.  Pacer settings adjusted 12/2017 to allow more chronotrophc variance with ADL's//exertion.   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   02/2016 EF 40%, diffuse hypokinesis, grade 2 DD.  Myoc perf imaging showed EF 32% 04/2016.  Pt upgraded to CRT-D 01/04/17.   Chronic renal insufficiency, stage III (moderate) (HCC) 2015   CrCl about 60 ml/min   Complete heart block (HCC)    Has dual chamber pacer.   COVID-19 virus infection 01/05/2021   paxlovid   Depression    DOE (dyspnea on exertion)    NYHA class II/III CHF   Dyspnea 2021   with exertion, bending over   Dysrhythmia    A. Fib   Episodic low back pain 01/22/2013   w/intermittent radiculitis (12/2014 his neurologist referred him to pain mgmt for epidural steroid injection)   Erectile dysfunction 2019   due to zoloft --urol rx'd viagra   GERD (gastroesophageal reflux disease)    H/O tilt table evaluation 11/02/2005   negative   Helicobacter pylori gastritis 01/2016   History of adenomatous polyp of colon 10/12/2011   Dr. Rollin (3 right side of colon- tubular adenomas removed)    History of cardiovascular stress test 05/28/2012   no ischemia, EF 37%, imaging results are unchanged and within normal variance   History of chronic prostatitis    History of kidney stones    History of vertigo    + Hx of posterior HA's.  Neuro (Dr. Maurice) eval 2011.  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   Hyperlipidemia    Hypertension    Lumbar spondylosis    lumbosacral radiculopathy at L4 by EMG testing, right foot drop (  neurologist is Dr. Birder with [facility] in [place])--neurologist referred him to neurosurgery   Migraine    used to have them all the time; none for years (01/04/2017)   Myocardial infarction [facility]) ?1970s   not entirely certain of this   Nephrolithiasis 07/2012   Left UVJ 2 mm stone with dilation of renal collecting system and slight hydroureter on right   Neuropathy    NICM (nonischemic cardiomyopathy) (HCC)    a. 02/2018 Cath: LM nl, LAD min irregs, LCX no, RCA 20d. [id number]. Fick CO/CI 4.4/2.0.   Osteoarthritis, multiple sites    Shoulders, back, knees   Pacemaker 02/05/2012   dual chamber, complete heart block, meddtronic revo, lasted checked 12/2015.  Since no CAD on cath 05/2016, cards recommends upgrade to CRT-D.   Peripheral vascular disease (HCC)    Peripheral Artery Disease   Permanent atrial fibrillation (HCC)    DCCV 07/09/13-converted, lasted two days, then back into afib--needs lifetime anticoagulation (Xarelto  as of 09/2014)   Prostate cancer screening 09/2017   done by urol annually (normal prostate exam documented + PSA 0.84 as of 10/01/17 urol f/u.  10/2018 urol f/u PSA 0.6, no prostate nodule.   Rectus diastasis    Right ankle sprain 08/2017   w/distal fibula avulsion fx noted on u/s but not plain film-(Dr. Hudnall).   Skin cancer of arm, left    burned it off (01/04/2017)   TIA (transient ischemic attack)    L face and L arm weakness. Peri  procedural->a. 03/22/2018 following cath. CT head neg. No MRI b/c has pacer. Likely due to embolus to distal branch of RMCA   Type II diabetes mellitus (HCC)    Past Surgical History:  Procedure Laterality Date   ABI's Bilateral 05/21/2018   normal   ANTERIOR LATERAL LUMBAR FUSION WITH PERCUTANEOUS SCREW 2 LEVEL Left 02/25/2024   Procedure: LEFT ANTERIOR LATERAL LUMBAR FUSION WITH PERCUTANEOUS PEDICLE SCREW LUMBAR TWO-LUMBAR THREE, LUMBAR THREE-LUMBAR FOUR, POSTERIOR LATERAL AND INTERBODY FUSION;  Surgeon: Louis Shove, MD;  Location: [facility] OR;  Service: Neurosurgery;  Laterality: Left;  XLIF - left - L2-L3 - L3-L4 with perc pedicle screws - Posterior Lateral and Interbody fusion   BACK SURGERY     BIV ICD INSERTION CRT-D N/A 01/04/2017   Procedure: BiV ICD ;  Surgeon: Inocencio Soyla Lunger, MD;  Location: [facility] INVASIVE CV LAB;  Service: Cardiovascular;  Laterality: N/A;   CARDIAC CATHETERIZATION N/A 06/14/2016   Minimal nonobstructive dz, EF 25-35%.  Procedure: Left Heart Cath and Coronary Angiography;  Surgeon: Peter M Swaziland, MD;  Location: [facility] INVASIVE CV LAB;  Service: Cardiovascular;  Laterality: N/A;   CARDIOVASCULAR STRESS TEST  2012   2012 nuclear perfusion study: low risk scan; 04/2016 normal myocardial perfusion imaging, EF 32%.   CARDIOVERSION  07/09/2012   Procedure: CARDIOVERSION;  Surgeon: Jerel Balding, MD;  Location: [facility] ENDOSCOPY;  Service: Cardiovascular;  Laterality: N/A;   CATARACT EXTRACTION W/ INTRAOCULAR LENS IMPLANT & ANTERIOR VITRECTOMY, BILATERAL Bilateral    CIRCUMCISION N/A 03/23/2020   Procedure: CIRCUMCISION ADULT;  Surgeon: Rosalind Zachary NOVAK, MD;  Location: [facility] ORS;  Service: Urology;  Laterality: N/A;   COLONOSCOPY W/ POLYPECTOMY  approx 2006; repeated 09/2011   Polyps on 2013 EGD as well, repeat 12/2014   COLONOSCOPY WITH PROPOFOL  N/A 07/08/2021   adenoma x 1. Procedure: COLONOSCOPY WITH PROPOFOL ;  Surgeon: Rollin Dover, MD;  Location: [facility] END[facility]COPY;  Service: Endoscopy;   Laterality: N/A;   ESOPHAGEAL DILATION  10/19/2023   Procedure: DILATION,  ESOPHAGUS;  Surgeon: Rollin Dover, MD;  Location: [facility];  Service: Gastroenterology;;   ESOPHAGOGASTRODUODENOSCOPY  10/18/2006   Done due to chronic GERD: Normal, bx showed no barrett's esophagus (Dr. Rollin)   EYE SURGERY  01/08/2023   FLEXOR TENDON REPAIR Left 10/02/2016   Procedure: LEFT RING FINGER WOUND EXPORATION AND FLEXOR TENDON REPAIR AND NERVE REPAIR;  Surgeon: Alm Hummer, MD;  Location: [facility] OR;  Service: Orthopedics;  Laterality: Left;   INSERT / REPLACE / REMOVE PACEMAKER  02/05/2012   dual chamber, sinus node dysfunction, sinus arrest, PAF, Medtronic Revo serial#-[id number] H: last checked 05/2015   LUMBAR LAMINECTOMY Left 1976   L4-5   PACEMAKER REMOVAL  01/04/2017   PERMANENT PACEMAKER INSERTION N/A 02/05/2012   Procedure: PERMANENT PACEMAKER INSERTION;  Surgeon: Jerel Balding, MD; Generator Medtronic [place] model [place] serial number [id number] H Laterality: N/A;   POLYPECTOMY  07/08/2021   Procedure: POLYPECTOMY;  Surgeon: Rollin Dover, MD;  Location: [facility];  Service: Endoscopy;;   RETINAL DETACHMENT SURGERY Left ~ 1999   REVERSE SHOULDER ARTHROPLASTY Left 2018   Left shoulder reverse [facility] in [place]).   RIGHT/LEFT HEART CATH AND CORONARY ANGIOGRAPHY N/A 03/22/2018   EF 30-35%, no CAD.  Procedure: RIGHT/LEFT HEART CATH AND CORONARY ANGIOGRAPHY;  Surgeon: Cherrie Toribio SAUNDERS, MD;  Location: [facility] INVASIVE CV LAB;  Service: Cardiovascular;  Laterality: N/A;   SAVORY DILATION N/A 10/19/2023   Procedure: EGD, WITH DILATION USING SAVARY-GILLIARD DILATOR OVER GUIDEWIRE;  Surgeon: Rollin Dover, MD;  Location: [facility];  Service: Gastroenterology;  Laterality: N/A;   TRANSTHORACIC ECHOCARDIOGRAM  08/25/10; 05/2012; 03/23/16;12/2017   mild asymmetric LVH, normal systolic function, normal diastolic fxn, mild-to-mod mitral regurg, mild aortic valve sclerosis and trace AI, mild aortic  root dilatation. 2014 f/u showed EF 40-45%, mod LAE, A FIB.  02/2016 EF 40%, diffuse hypokinesis, grade 2 DD. 12/2017 EF 35-40%,diffuse hypokin,grd III DD, mild MR   WISDOM TOOTH EXTRACTION  06/27/2021   EF 50-55%, mild aortic root/ascending aorta dilatation (41-42 mm).   Social History   Socioeconomic History   Marital status: Married    Spouse name: Not on file   Number of children: Not on file   Years of education: Not on file   Highest education level: 12th grade  Occupational History   Not on file  Tobacco Use   Smoking status: Never   Smokeless tobacco: Never  Vaping Use   Vaping status: Never Used  Substance and Sexual Activity   Alcohol use: Yes    Alcohol/week: 3.0 - 4.0 standard drinks of alcohol    Types: 2 - 3 Cans of beer, 1 Shots of liquor per week   Drug use: No   Sexual activity: Not Currently  Other Topics Concern   Not on file  Social History Narrative   Not on file   Social Drivers of Health   Financial Resource Strain: Low Risk  (09/05/2023)   Overall Financial Resource Strain (CARDIA)    Difficulty of Paying Living Expenses: Not very hard  Food Insecurity: No Food Insecurity (03/21/2024)   Hunger Vital Sign    Worried About Running Out of Food in the Last Year: Never true    Ran Out of Food in the Last Year: Never true  Transportation Needs: No Transportation Needs (03/21/2024)   PRAPARE - Administrator, Civil Service (Medical): No    Lack of Transportation (Non-Medical): No  Physical Activity: Inactive (09/05/2023)   Exercise Vital Sign    Days of  Exercise per Week: 0 days    Minutes of Exercise per Session: 0 min  Stress: No Stress Concern Present (09/05/2023)   [employer] of Occupational Health - Occupational Stress Questionnaire    Feeling of Stress : Not at all  Social Connections: Moderately Integrated (03/17/2024)   Social Connection and Isolation Panel    Frequency of Communication with Friends and Family: More than three  times a week    Frequency of Social Gatherings with Friends and Family: Twice a week    Attends Religious Services: More than 4 times per year    Active Member of [employer] or Organizations: No    Attends Banker Meetings: Never    Marital Status: Married  Catering manager Violence: Not At Risk (03/21/2024)   Humiliation, Afraid, Rape, and Kick questionnaire    Fear of Current or Ex-Partner: No    Emotionally Abused: No    Physically Abused: No    Sexually Abused: No   Current Outpatient Medications on File Prior to Visit  Medication Sig Dispense Refill   acetaminophen  (TYLENOL ) 650 MG CR tablet Take 650 mg by mouth every 8 (eight) hours as needed for pain.     COMBIGAN  0.2-0.5 % ophthalmic solution Place 1 drop into both eyes every 12 (twelve) hours.     ENTRESTO  97-103 MG TAKE 1 TABLET TWO TIMES A DAY 180 tablet 3   eplerenone  (INSPRA ) 25 MG tablet TAKE 1 TABLET DAILY 90 tablet 3   gabapentin  (NEURONTIN ) 300 MG capsule TAKE 2 CAPSULES TWICE A DAY 360 capsule 3   HYDROmorphone  (DILAUDID ) 2 MG tablet Take 0.5 tablets (1 mg total) by mouth every 6 (six) hours as needed for severe pain (pain score 7-10). 13 tablet 0   insulin  aspart (NOVOLOG  FLEXPEN) 100 UNIT/ML FlexPen Inject 10 units before each meal (Patient taking differently: 12 Units 3 (three) times daily with meals.) 30 mL 3   insulin  glargine (LANTUS  SOLOSTAR) 100 UNIT/ML Solostar Pen Inject 30-34 Units into the skin daily. (Patient taking differently: Inject 28 Units into the skin daily.)     JARDIANCE  10 MG TABS tablet TAKE 1 TABLET DAILY 90 tablet 3   latanoprost  (XALATAN ) 0.005 % ophthalmic solution Place 1 drop into both eyes at bedtime.     lidocaine  (LIDODERM ) 5 % PLACE 1 PATCH ON THE SKIN DAILY, REMOVE AND DISCARD PATCH WITHIN 12 HOURS OR AS DIRECTED BY DOCTOR (OFFICE VISIT NEEDED FOR FURTHER REFILLS) 90 patch 3   meclizine  (ANTIVERT ) 25 MG tablet TAKE 1 TABLET THREE TIMES A DAY AS NEEDED FOR DIZZINESS 270 tablet 0    methocarbamol  (ROBAXIN ) 500 MG tablet Take 2 tablets (1,000 mg total) by mouth every 6 (six) hours as needed for muscle spasms. 56 tablet 0   omeprazole  (PRILOSEC) 40 MG capsule Take 1 capsule (40 mg total) by mouth daily. 90 capsule 3   oxybutynin  (DITROPAN -XL) 10 MG 24 hr tablet Take 1 tablet (10 mg total) by mouth at bedtime. 90 tablet 0   polyethylene glycol (MIRALAX ) 17 g packet Take 17 g by mouth daily. 14 each 0   Rivaroxaban  (XARELTO ) 15 MG TABS tablet Take 1 tablet (15 mg total) by mouth daily with supper. 90 tablet 0   rOPINIRole  (REQUIP ) 1 MG tablet Take 1 tablet (1 mg total) by mouth at bedtime. 90 tablet 3   rosuvastatin  (CRESTOR ) 20 MG tablet Take 20 mg by mouth at bedtime.     Semaglutide , 1 MG/DOSE, 4 MG/3ML SOPN Inject 1 mg as  directed once a week. (Patient not taking: Reported on 03/25/2024) 9 mL 3   sertraline  (ZOLOFT ) 100 MG tablet TAKE 1 TABLET DAILY 90 tablet 0   No current facility-administered medications on file prior to visit.   No Known Allergies Family History  Problem Relation Age of Onset   Heart failure Mother    Stroke Mother    Stroke Father    Heart disease Sister    Heart disease Sister    Cancer Sister        liver   Cancer Brother        lung   Cancer Brother        lung   Heart disease Brother    PE: BP 120/68   Pulse 87   Ht 5' 11 (1.803 m)   Wt 207 lb (93.9 kg)   SpO2 97%   BMI 28.87 kg/m  Wt Readings from Last 10 Encounters:  03/28/24 207 lb (93.9 kg)  03/25/24 201 lb 12.8 oz (91.5 kg)  03/20/24 202 lb 13.2 oz (92 kg)  03/20/24 202 lb 3.2 oz (91.7 kg)  03/17/24 200 lb (90.7 kg)  03/11/24 203 lb (92.1 kg)  03/05/24 205 lb (93 kg)  03/03/24 210 lb 1.6 oz (95.3 kg)  02/25/24 210 lb (95.3 kg)  02/15/24 216 lb (98 kg)   Constitutional: overweight, in NAD Eyes: EOMI, no exophthalmos ENT: no thyromegaly, no cervical lymphadenopathy Cardiovascular: RRR, No MRG Respiratory: CTA B Musculoskeletal: no deformities Skin: no  rashes Neurological: + tremor with outstretched hands  ASSESSMENT: 1. DM2, insulin -dependent, uncontrolled, with complications: - CAD with ?h/o AMI - Dr. Francyne - nonischemic CMP - CHF, s/p AICD - Afib, s/p pacemaker - cerebrovascular disease with h/o TIA - CKD stage III - peripheral neuropathy - ED  No family history of medullary thyroid  cancer or personal history of pancreatitis.  2. HL  PLAN:  1. Patient with longstanding, previously uncontrolled type 2 diabetes, on SGLT2 inhibitor, basal-bolus insulin  and weekly GLP-1 receptor agonist, with an improved HbA1c at last visit, at 6.8%.  Since then, he had another HbA1c obtained 2 months ago, and this was stable, at 6.8%. - At last visit, sugars were improved, with occasional slightly higher values after lunch, but more consistently elevated sugars after dinner/snack at night, with a peak between 1 and 2 AM.  We discussed about trying his best  to eliminate the snack at night.  He was also forgetting to take the NovoLog  before dinner and we discussed about possibly setting alarms on his phone to do so.  Otherwise, I did not recommend a change in regimen.  Before last visit he started back on Farxiga, he previously stopped due to dry mouth.  This was only intermittent and not as bothersome as before. CGM interpretation: -At today's visit, we reviewed his CGM downloads: It appears that 46% of values are in target range (goal >70%), while 52% are higher than 180 (goal <25%), and 2% are lower than 70 (goal <4%).  The calculated average blood sugar is 190.  The projected HbA1c for the next 3 months (GMI) is 7.9%. -Reviewing the CGM trends, sugars appear to still fluctuate around the upper limit of the target range, with occasional drops under 70s but more consistent elevations in blood sugars, in the 200s and higher after dinner.  Sugars remain elevated up until approximately 1 AM, when they start to decrease towards the normal range, but not  quite to target. -Upon questioning, he is still off  Ozempic , which he stopped before the surgery.  Due to his constipation and also other GI symptoms (mild nausea, possibly still related to opioids), I did not suggest to add Ozempic  back quite yet.  He relaxed his diet since last visit and is missing many insulin  injections.  For now, I advised him to focus on taking Lantus  every day at bedtime and also taking NovoLog  15 minutes before meals.  I do not necessarily feel that he needs an increase in doses.  We also discussed about trying to improve diet, and cutting down snacks.  Will continue Jardiance  for now. - I suggested to:  Patient Instructions  Please continue: - Jardiance  10 mg daily before b'fast - Lantus  28 units at bedtime - NovoLog  15 min before meals: 10-12 units before brunch 8-10 units before dinner If you have to take the suppertime insulin  after the meal, take only up to 5 units.  Try to stop the snack at night.  Please return in 2 months.  - we checked his HbA1c: 7.9% (higher) - advised to check sugars at different times of the day - 4x a day, rotating check times - advised for yearly eye exams >> he is UTD - he has twice a year foot exam at the [facility].  He is on Neurontin  and ropinirole .  I previously suggested alpha lipoic acid but he did not start this.  I again suggested this at last visit along with a diabetic cream.  - we we will check an ACR today - return to clinic in 3 mo  2. HL - Reviewed latest lipid panel from 03/2023: All fractions at goal: Lab Results  Component Value Date   CHOL 116 04/06/2023   HDL 42.20 04/06/2023   LDLCALC 45 04/06/2023   LDLDIRECT 103.0 06/22/2020   TRIG 145.0 04/06/2023   CHOLHDL 3 04/06/2023  - Continues on Crestor  20 mg daily without side effects  Lela Fendt, MD PhD [facility] Endocrinology

## 2024-03-28 NOTE — Transitions of Care (Post Inpatient/ED Visit) (Signed)
 03/28/2024  Patient ID: Mason [name], male   DOB: [birth year], 83 y.o.   MRN: [id number]  Patient called care guide earlier today to move his appointment from 11 am  to 2pm.  Placed call to patient at requested time with no answer.   Alan Ee, RN, BSN, CEN [employer]- Transition of Care Team.  [facility] [phone]

## 2024-03-29 ENCOUNTER — Ambulatory Visit: Payer: Self-pay | Admitting: Internal Medicine

## 2024-03-29 LAB — MICROALBUMIN / CREATININE URINE RATIO
Creatinine, Urine: 137 mg/dL (ref 20–320)
Microalb Creat Ratio: 4 mg/g{creat} (ref <30)
Microalb, Ur: 0.5 mg/dL

## 2024-03-31 DIAGNOSIS — Z981 Arthrodesis status: Secondary | ICD-10-CM | POA: Diagnosis not present

## 2024-03-31 DIAGNOSIS — Z4789 Encounter for other orthopedic aftercare: Secondary | ICD-10-CM | POA: Diagnosis not present

## 2024-04-01 ENCOUNTER — Other Ambulatory Visit: Payer: Self-pay | Admitting: Pharmacist

## 2024-04-01 ENCOUNTER — Telehealth: Payer: Self-pay

## 2024-04-01 NOTE — Progress Notes (Signed)
 04/01/2024 Name: Mason [name] MRN: [id number] DOB: June 19, 1941  Chief Complaint  Patient presents with   Medication Management   Diabetes    Mason [name] is a 83 y.o. year old male who presented for a telephone visit.   They were referred to the pharmacist by their Case Management Team  for assistance in managing complex medication management.    Subjective:  [name] had back surgery 02/25/2024. Had left-sided L2-3 and L3-4 retroperitoneal interbody decompression and fusion with posterior percutaneous fixation.   He has continued to have pain post surgery. He had follow up with Dr Louis 03/27/2024. He states pain since surgery is still not controlled and has stayed the same since I spoke with him 03/20/2024. He does feel that his mobility has improved a little since 03/20/2024 but still having fatigue and weakness.   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   03/11/2024 - ED Visit for post operative pain Meds prescribed at discharge: prednisone  20mg  - take 2 tabs = 40mg  daily for 4 days and hydromorphone  2mg  - take 0.5 tablet = 1mg  every 6 hours as needed for severe pain   03/16/2024 to 03/18/2025 - Admission for intractable pain Medications stopped at discharge - metoprolol  ER 50mg  daily  Medications changed at discharge - lowered dose of Xarelto  from 20mg  to 15mg  daily (due to changes in renal function)  03/20/2024 - ED Visit for hypotension. Blood pressure was 64/42 at PCP visit on 03/20/2024 - sent to ED by EMS. Patient has given fluids and blood pressure improved. Discharged home. No medication  changes noted.    Care Team: Primary Care Provider: Candise Aleene DEL, MD ; Next Scheduled Visit: 04/16/2024 Cardiologist: Dr Francyne; Next Scheduled Visit: 04/24/2024 Endocrinologist Dr Trixie; Next Scheduled Visit: 05/29/2024 Neurosurgeon: Dr Louis; Next Scheduled Visit: unsure Ortho - Dr Georgina; New patient visit for knee pain - 05/01/2024  Medication Access/Adherence  Current Pharmacy:  [id number] - [place], [place] - [address] [place] [address] Phone: [phone] Fax: [phone]  [facility] - [place], [place] - [address] [place] [address] Phone: [phone] Fax: [phone]  [facility] [place] (New Address) - [place], [place] - [address] AT Previously: Viviana Mulligan, [place] [address] [facility] [address] [place] [address] Phone: [phone] Fax: [phone]   Patient reports affordability concerns with their medications: No  Patient reports access/transportation concerns to their pharmacy: No  Patient reports adherence concerns with their medications:  No   Wife helps with medication management and administration.   Current diabetes regimen:  Lantus  28 units at bedtime  Novolog  10 to 12 units with meals and 3 units with snacks Jardiance  10mg  daily.   He is still holding Ozempic  due to low oral intake. He was seen by Dr Trixie last week. Noted to have increase GMI but no medication changes due to patient not back  to pre-op baseline.  Home blood glucose readings per his wife have been higher recently with more postprandial excursions per Continuous Glucose Monitor report in Dr Ara notes.   Patient states blood glucose was high last night (around 250) after he ate an ice cream snack. Patient   Taking Xarelto  for afib. Previously was taking 20mg  daily but was  recently lowered to 15mg  daily. Suspect dose adjusted for renal changes.  Wt = 200 lbs / Ht = 5'11 / Scr = 1.51 (but improved with last check to 1.4) Estimated Creatinine Clearance based on these measures is now improved from 49 mL/min to 53 mL.min  - recommended to decreased dose to 15mg  daily if CrCl is < 50    Objective:  BP Readings from Last 3 Encounters:  03/28/24 120/68  03/25/24 90/62  03/20/24 (!) 122/95     Lab Results  Component Value Date   HGBA1C 7.9 (A) 03/28/2024    Lab Results  Component Value Date   CREATININE 1.40 (H) 03/20/2024   BUN 18 03/20/2024   NA 136 03/20/2024   K 4.0 03/20/2024   CL 103 03/20/2024   CO2 22 03/20/2024    Lab Results  Component Value Date   CHOL 116 04/06/2023   HDL 42.20 04/06/2023   LDLCALC 45 04/06/2023   LDLDIRECT 103.0 06/22/2020   TRIG 145.0 04/06/2023   CHOLHDL 3 04/06/2023    Medications Reviewed Today     Reviewed by Carla Milling, RPH-CPP (Pharmacist) on 04/01/24 at 1149  Med List Status: <None>   Medication Order Taking? Sig Documenting Provider Last Dose Status Informant  acetaminophen  (TYLENOL ) 650 MG CR tablet [id number] Yes Take 650 mg by mouth every 8 (eight) hours as needed for pain. [provider]  Active   COMBIGAN  0.2-0.5 % ophthalmic solution [id number]  Place 1 drop into both eyes every 12 (twelve) hours. [provider]  Active Self, Pharmacy Records           Med Note ALAIN, [place] A   Fri Jun 02, 2016  1:39 PM)    ENTRESTO  97-103 MG [id number] Yes TAKE 1 TABLET TWO TIMES A DAY Croitoru, Mihai, MD  Active Self, Pharmacy Records  eplerenone  (INSPRA ) 25 MG tablet [id number] Yes TAKE 1 TABLET DAILY Croitoru, Mihai, MD  Active Self, Pharmacy Records  gabapentin  (NEURONTIN ) 300 MG capsule [id number] Yes TAKE 2 CAPSULES TWICE A DAY McGowen, Aleene DEL, MD  Active Self, Pharmacy Records   Patient not taking:   Discontinued 04/01/24 1146 (Change in therapy)   insulin  aspart (NOVOLOG  FLEXPEN)  100 UNIT/ML FlexPen [id number] Yes Inject 10 units before each meal  Patient taking differently: Inject 10-12 Units into the skin 3 (three) times daily with meals.   Trixie File, MD  Active Self, Pharmacy Records  insulin  glargine (LANTUS  SOLOSTAR) 100 UNIT/ML Solostar Pen [id number] Yes Inject 30-34 Units into the skin daily.  Patient taking differently: Inject 28 Units into the skin daily.   Trixie File, MD  Active Self, Pharmacy Records  JARDIANCE  10 MG TABS tablet [id number] Yes TAKE 1 TABLET DAILY Trixie File, MD  Active Self, Pharmacy Records  latanoprost  (XALATAN ) 0.005 % ophthalmic solution [id number] Yes Place 1 drop into both eyes at bedtime. [provider]  Active Self, Pharmacy Records  lidocaine  (LIDODERM ) 5 % [id number] Yes PLACE 1 PATCH ON THE SKIN DAILY, REMOVE AND DISCARD PATCH WITHIN 12 HOURS OR AS DIRECTED BY DOCTOR (OFFICE VISIT NEEDED FOR FURTHER REFILLS) McGowen, Aleene DEL, MD  Active  meclizine  (ANTIVERT ) 25 MG tablet [id number] Yes TAKE 1 TABLET THREE TIMES A DAY AS NEEDED FOR DIZZINESS McGowen, Aleene DEL, MD  Active   methocarbamol  (ROBAXIN ) 500 MG tablet [id number]  Take 2 tablets (1,000 mg total) by mouth every 6 (six) hours as needed for muscle spasms.  Patient not taking: Reported on 04/01/2024   Gennaro Duwaine CROME, DO  Active Self, Pharmacy Records  omeprazole  [facility]) 40 MG capsule [id number]  Take 1 capsule (40 mg total) by mouth daily.  Patient not taking: Reported on 04/01/2024   McGowen, Philip H, MD  Active   oxybutynin  (DITROPAN -XL) 10 MG 24 hr tablet [id number] Yes Take 1 tablet (10 mg total) by mouth at bedtime. Candise Aleene DEL, MD  Active Self, Pharmacy Records  Oxycodone  HCl 10 MG TABS [id number] Yes Take 10 mg by mouth every 4 (four) hours as needed (pain). [provider]  Active   polyethylene glycol (MIRALAX ) 17 g packet [id number]  Take 17 g by mouth daily. Mannie Fairy DASEN, DO  Active Self, Pharmacy Records  Rivaroxaban   (XARELTO ) 15 MG TABS tablet [id number] Yes Take 1 tablet (15 mg total) by mouth daily with supper. Darci Pore, MD  Active   rOPINIRole  (REQUIP ) 1 MG tablet [id number] Yes Take 1 tablet (1 mg total) by mouth at bedtime. McGowen, Philip H, MD  Active Self, Pharmacy Records  rosuvastatin  (CRESTOR ) 20 MG tablet [id number] Yes Take 20 mg by mouth at bedtime. [provider]  Active Self, Pharmacy Records  Semaglutide , 1 MG/DOSE, 4 MG/3ML SOPN [id number]  Inject 1 mg as directed once a week.  Patient not taking: Reported on 04/01/2024   Trixie File, MD  Active Self, Pharmacy Records  sertraline  (ZOLOFT ) 100 MG tablet [id number] Yes TAKE 1 TABLET DAILY McGowen, Aleene DEL, MD  Active Self, Pharmacy Records              Assessment/Plan:   Medication Management / Polypharmacy:  - Reviewed medication list and updated - Continue to monitor serum Creatinine. If Creatinine clearance still > 50 mL/min with next serum creatinine check, then consider changing pack to Xarelto  20mg  dose.  - Reminded patient to stop evening snack but if he has one he should take Novolog  3 units prior. Once appetite and oral intake is better can consider restart Ozempic  - 0.25 to 0.5mg  weekly for 2 to 4 weeks and increase as tolerate to pre op dose of 1mg  weekly   Follow Up Plan: 2 - 4 weeks.   Madelin Ray, PharmD Clinical Pharmacist [facility]  Population Health [phone]

## 2024-04-01 NOTE — Transitions of Care (Post Inpatient/ED Visit) (Signed)
 04/01/2024  Patient ID: Mason [name], male   DOB: 06-Aug-1940, 83 y.o.   MRN: [id number]  Case closed. Unable to maintain contact.  Alan Ee, RN, BSN, CEN [employer]- Transition of Care Team.  [facility] [phone]

## 2024-04-02 ENCOUNTER — Telehealth: Payer: Self-pay

## 2024-04-02 NOTE — Telephone Encounter (Signed)
 Received fax from [facility] Certification and POC 03/22/24 to 05/20/24  Order # [id number]  McGowen inbox front office

## 2024-04-03 ENCOUNTER — Encounter (HOSPITAL_COMMUNITY)
Admission: RE | Admit: 2024-04-03 | Discharge: 2024-04-03 | Disposition: A | Discharge status: Home / Self Care | Source: Ambulatory Visit | Attending: Neurosurgery | Admitting: Neurosurgery

## 2024-04-03 ENCOUNTER — Ambulatory Visit (HOSPITAL_COMMUNITY)
Admission: RE | Admit: 2024-04-03 | Discharge: 2024-04-03 | Disposition: A | Discharge status: Home / Self Care | Source: Ambulatory Visit | Attending: Neurosurgery | Admitting: Neurosurgery

## 2024-04-03 DIAGNOSIS — E785 Hyperlipidemia, unspecified: Secondary | ICD-10-CM | POA: Diagnosis not present

## 2024-04-03 DIAGNOSIS — M4310 Spondylolisthesis, site unspecified: Secondary | ICD-10-CM

## 2024-04-03 DIAGNOSIS — I130 Hypertensive heart and chronic kidney disease with heart failure and stage 1 through stage 4 chronic kidney disease, or unspecified chronic kidney disease: Secondary | ICD-10-CM | POA: Diagnosis not present

## 2024-04-03 DIAGNOSIS — M25561 Pain in right knee: Secondary | ICD-10-CM | POA: Diagnosis not present

## 2024-04-03 DIAGNOSIS — Z4789 Encounter for other orthopedic aftercare: Secondary | ICD-10-CM | POA: Diagnosis not present

## 2024-04-03 DIAGNOSIS — E1122 Type 2 diabetes mellitus with diabetic chronic kidney disease: Secondary | ICD-10-CM | POA: Diagnosis not present

## 2024-04-03 DIAGNOSIS — I5022 Chronic systolic (congestive) heart failure: Secondary | ICD-10-CM | POA: Diagnosis not present

## 2024-04-03 DIAGNOSIS — I442 Atrioventricular block, complete: Secondary | ICD-10-CM | POA: Diagnosis not present

## 2024-04-03 DIAGNOSIS — Z981 Arthrodesis status: Secondary | ICD-10-CM | POA: Diagnosis not present

## 2024-04-03 DIAGNOSIS — M1712 Unilateral primary osteoarthritis, left knee: Secondary | ICD-10-CM | POA: Diagnosis not present

## 2024-04-03 DIAGNOSIS — N183 Chronic kidney disease, stage 3 unspecified: Secondary | ICD-10-CM | POA: Diagnosis not present

## 2024-04-03 DIAGNOSIS — I4821 Permanent atrial fibrillation: Secondary | ICD-10-CM | POA: Diagnosis not present

## 2024-04-03 DIAGNOSIS — Z950 Presence of cardiac pacemaker: Secondary | ICD-10-CM | POA: Diagnosis not present

## 2024-04-03 DIAGNOSIS — M11262 Other chondrocalcinosis, left knee: Secondary | ICD-10-CM | POA: Diagnosis not present

## 2024-04-03 MED ORDER — TECHNETIUM TC 99M MEDRONATE IV KIT
20.0000 | PACK | Freq: Once | INTRAVENOUS | Status: AC | PRN
  Administered 2024-04-03: 20.4 via INTRAVENOUS

## 2024-04-04 DIAGNOSIS — Z981 Arthrodesis status: Secondary | ICD-10-CM | POA: Diagnosis not present

## 2024-04-04 DIAGNOSIS — Z4789 Encounter for other orthopedic aftercare: Secondary | ICD-10-CM | POA: Diagnosis not present

## 2024-04-05 ENCOUNTER — Other Ambulatory Visit: Payer: Self-pay | Admitting: Family Medicine

## 2024-04-07 ENCOUNTER — Other Ambulatory Visit: Payer: Self-pay | Admitting: Cardiovascular Disease

## 2024-04-07 ENCOUNTER — Other Ambulatory Visit: Payer: Self-pay

## 2024-04-07 ENCOUNTER — Encounter: Payer: Self-pay | Admitting: Family Medicine

## 2024-04-07 DIAGNOSIS — Z981 Arthrodesis status: Secondary | ICD-10-CM | POA: Diagnosis not present

## 2024-04-07 DIAGNOSIS — Z4789 Encounter for other orthopedic aftercare: Secondary | ICD-10-CM | POA: Diagnosis not present

## 2024-04-10 ENCOUNTER — Other Ambulatory Visit: Payer: Self-pay

## 2024-04-10 ENCOUNTER — Telehealth: Payer: Self-pay | Admitting: *Deleted

## 2024-04-10 MED ORDER — ROSUVASTATIN CALCIUM 20 MG PO TABS: 20.0000 mg | ORAL_TABLET | Freq: Every day | ORAL | 1 refills | Status: DC

## 2024-04-10 NOTE — Telephone Encounter (Signed)
 Prescription refill request for Xarelto  received.  Indication: afib  Last office visit: 04/12/2023 Weight: 93.9 kg  Age: 83 yo  Scr: 1.40, 8/38/2025 CrCl: 55 ml/min   Scheduled to see Dr. Francyne on 10/2  Per dosing criteria pt qualifies for Xarelto  20mg  daily.   Pt requesting a 90 day supply sent to [facility]

## 2024-04-11 DIAGNOSIS — Z981 Arthrodesis status: Secondary | ICD-10-CM | POA: Diagnosis not present

## 2024-04-11 DIAGNOSIS — Z4789 Encounter for other orthopedic aftercare: Secondary | ICD-10-CM | POA: Diagnosis not present

## 2024-04-14 ENCOUNTER — Other Ambulatory Visit: Payer: Self-pay

## 2024-04-14 MED ORDER — RIVAROXABAN 15 MG PO TABS: 15.0000 mg | ORAL_TABLET | Freq: Every day | ORAL | 0 refills | Status: DC

## 2024-04-14 MED ORDER — OMEPRAZOLE 40 MG PO CPDR: 40.0000 mg | DELAYED_RELEASE_CAPSULE | Freq: Every day | ORAL | 1 refills | Status: DC

## 2024-04-14 NOTE — Telephone Encounter (Signed)
 Msg sent to patient.  Hospital changed dose, but now incorrect based on weight gain.  Trying to determine if he has enough to last until MD appt.

## 2024-04-16 ENCOUNTER — Ambulatory Visit (INDEPENDENT_AMBULATORY_CARE_PROVIDER_SITE_OTHER): Admitting: Family Medicine

## 2024-04-16 ENCOUNTER — Encounter: Payer: Self-pay | Admitting: Family Medicine

## 2024-04-16 VITALS — BP 81/48 | HR 83 | Temp 97.9°F | Ht 71.0 in | Wt 211.2 lb

## 2024-04-16 DIAGNOSIS — I9589 Other hypotension: Secondary | ICD-10-CM | POA: Diagnosis not present

## 2024-04-16 DIAGNOSIS — M25562 Pain in left knee: Secondary | ICD-10-CM | POA: Diagnosis not present

## 2024-04-16 DIAGNOSIS — Z4789 Encounter for other orthopedic aftercare: Secondary | ICD-10-CM | POA: Diagnosis not present

## 2024-04-16 DIAGNOSIS — M79652 Pain in left thigh: Secondary | ICD-10-CM

## 2024-04-16 DIAGNOSIS — Z981 Arthrodesis status: Secondary | ICD-10-CM | POA: Diagnosis not present

## 2024-04-16 DIAGNOSIS — N3281 Overactive bladder: Secondary | ICD-10-CM

## 2024-04-16 MED ORDER — OXYCODONE HCL 10 MG PO TABS: ORAL_TABLET | ORAL | 0 refills | Status: DC

## 2024-04-16 MED ORDER — MIRABEGRON ER 25 MG PO TB24: ORAL_TABLET | ORAL | 1 refills | Status: DC

## 2024-04-16 NOTE — Patient Instructions (Signed)
 Call [facility] and ask if they have been sending rosuvastatin  (crestor ).

## 2024-04-16 NOTE — Progress Notes (Signed)
 OFFICE VISIT  04/16/2024  CC:  Chief Complaint  Patient presents with   Medical Management of Chronic Issues    3 week f/u    Patient is a 83 y.o. male who presents for 3-week follow-up hypotension and acute left knee pain. A/P as of last visit: #1 hypotension. Currently asymptomatic.  Today's blood pressure here is pretty consistent with his baseline.  Okay to continue Inspra  25 mg a day and Entresto  97-103 1 tab twice daily.  Encouraged adequate oral hydration.   2.  Acute left knee pain. Unknown etiology.  Not clearly radicular. Also not clearly attributable to the abnormalities noted on his 03/03/24 plain films (patellofemoral joint degenerative changes and a sclerotic ill-defined lesion along the medial cortex of the distal femoral diaphysis.) and his 03/11/24 CT of the knee (sclerotic lesion of distal femoral metadiaphysis, nonspecific). 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  He is NOT currently a candidate for MRI under [facility]'s current protocols.    He was seen in the hospital by orthopedic surgeon, Dr. Ozell Ada. Outpatient follow-up was recommended so I have ordered a referral to him today.  INTERIM HX: Feeling well other than left thigh and knee pain. No dizziness or generalized fatigue.  He has chronic urinary urgency and frequency and nocturia.  No straining.  Stream is strong. No dysuria.  He says he cannot tell that oxybutynin  has helped anything.  He has an appointment to see orthopedist, Dr. Ada, approximately 05/02/2024. He has taken all of his oxycodone  except one half a tab.  He does use Tylenol  but says it does not help.  He says he takes oxycodone  usually at bedtime only and it does alleviate his pain enough to help him rest better.   Past Medical History:  Diagnosis Date   AICD (automatic  cardioverter/defibrillator) present 2018   MDT CRT-D.  Fatigue-->completely pacer dependent.  Pacer settings adjusted 12/2017 to allow more chronotrophc variance with ADL's//exertion.   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   02/2016 EF 40%, diffuse hypokinesis, grade 2 DD.  Myoc perf imaging showed EF 32% 04/2016.  Pt upgraded to CRT-D 01/04/17.   Chronic renal insufficiency, stage III (moderate) 2015   CrCl about 60 ml/min   Clotting disorder    Complete heart block (HCC)    Has dual chamber pacer.   COVID-19 virus infection 01/05/2021   paxlovid   Depression    DOE (dyspnea on exertion)    NYHA class II/III CHF   Dyspnea 2021   with exertion, bending over   Dysrhythmia    A. Fib   Episodic low back pain 01/22/2013   w/intermittent radiculitis (12/2014 his neurologist referred him to pain mgmt for epidural steroid injection)   Erectile dysfunction 2019   due to zoloft --urol rx'd viagra   GERD (gastroesophageal reflux disease)    Glaucoma 2017   H/O tilt table evaluation 11/02/2005   negative   Helicobacter pylori gastritis 01/2016   History of adenomatous polyp of colon 10/12/2011   Dr. Rollin (3 right side of colon-  tubular adenomas removed)   History of cardiovascular stress test 05/28/2012   no ischemia, EF 37%, imaging results are unchanged and within normal variance   History of chronic prostatitis    History of kidney stones    History of vertigo    + Hx of posterior HA's.  Neuro (Dr. Maurice) eval 2011.  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   Hyperlipidemia    Hypertension     Lumbar spondylosis    lumbosacral radiculopathy at L4 by EMG testing, right foot drop (neurologist is Dr. Birder with [facility] in [place])--neurologist referred him to neurosurgery   Migraine    used to have them all the time; none for years (01/04/2017)   Myocardial infarction [facility]) ?1970s   not entirely certain of this   Nephrolithiasis 07/2012   Left UVJ 2 mm stone with dilation of renal collecting system and slight hydroureter on right   Neuropathy    NICM (nonischemic cardiomyopathy) (HCC)    a. 02/2018 Cath: LM nl, LAD min irregs, LCX no, RCA 20d. [id number]. Fick CO/CI 4.4/2.0.   Osteoarthritis, multiple sites    Shoulders, back, knees   Osteoporosis    Pacemaker 02/05/2012   dual chamber, complete heart block, meddtronic revo, lasted checked 12/2015.  Since no CAD on cath 05/2016, cards recommends upgrade to CRT-D.   Peripheral vascular disease    Peripheral Artery Disease   Permanent atrial fibrillation (HCC)    DCCV 07/09/13-converted, lasted two days, then back into afib--needs lifetime anticoagulation (Xarelto  as of 09/2014)   Prostate cancer screening 09/2017   done by urol annually (normal prostate exam documented + PSA 0.84 as of 10/01/17 urol f/u.  10/2018 urol f/u PSA 0.6, no prostate nodule.   Rectus diastasis    Right ankle sprain 08/2017   w/distal fibula avulsion fx noted on u/s but not plain film-(Dr. Hudnall).   Skin cancer of arm, left    burned it off (01/04/2017)   TIA (transient ischemic attack)    L face and L arm weakness. Peri procedural->a. 03/22/2018 following cath. CT head neg. No MRI b/c has pacer. Likely due to embolus to distal branch of RMCA   Type II diabetes mellitus (HCC)     Past Surgical History:  Procedure Laterality Date   ABI's Bilateral 05/21/2018   normal   ANTERIOR LATERAL LUMBAR FUSION WITH PERCUTANEOUS SCREW 2 LEVEL Left 02/25/2024   Procedure: LEFT ANTERIOR LATERAL LUMBAR FUSION WITH PERCUTANEOUS PEDICLE SCREW LUMBAR  TWO-LUMBAR THREE, LUMBAR THREE-LUMBAR FOUR, POSTERIOR LATERAL AND INTERBODY FUSION;  Surgeon: Louis Shove, MD;  Location: [facility] OR;  Service: Neurosurgery;  Laterality: Left;  XLIF - left - L2-L3 - L3-L4 with perc pedicle screws - Posterior Lateral and Interbody fusion   BACK SURGERY     BIV ICD INSERTION CRT-D N/A 01/04/2017   Procedure: BiV ICD ;  Surgeon: Inocencio Soyla Lunger, MD;  Location: [facility] INVASIVE CV LAB;  Service: Cardiovascular;  Laterality: N/A;   CARDIAC CATHETERIZATION N/A 06/14/2016   Minimal nonobstructive dz, EF 25-35%.  Procedure: Left Heart Cath and Coronary Angiography;  Surgeon: Peter M Swaziland, MD;  Location: [facility] INVASIVE CV LAB;  Service: Cardiovascular;  Laterality: N/A;   CARDIOVASCULAR STRESS TEST  2012   2012 nuclear perfusion study: low risk scan; 04/2016 normal myocardial perfusion imaging, EF 32%.   CARDIOVERSION  07/09/2012   Procedure: CARDIOVERSION;  Surgeon: Jerel Balding, MD;  Location: [facility] ENDOSCOPY;  Service: Cardiovascular;  Laterality:  N/A;   CATARACT EXTRACTION W/ INTRAOCULAR LENS IMPLANT & ANTERIOR VITRECTOMY, BILATERAL Bilateral    CIRCUMCISION N/A 03/23/2020   Procedure: CIRCUMCISION ADULT;  Surgeon: Rosalind Zachary NOVAK, MD;  Location: [facility] ORS;  Service: Urology;  Laterality: N/A;   COLONOSCOPY W/ POLYPECTOMY  approx 2006; repeated 09/2011   Polyps on 2013 EGD as well, repeat 12/2014   COLONOSCOPY WITH PROPOFOL  N/A 07/08/2021   adenoma x 1. Procedure: COLONOSCOPY WITH PROPOFOL ;  Surgeon: Rollin Dover, MD;  Location: [facility] ENDOSCOPY;  Service: Endoscopy;  Laterality: N/A;   ESOPHAGEAL DILATION  10/19/2023   Procedure: DILATION, ESOPHAGUS;  Surgeon: Rollin Dover, MD;  Location: [facility] ENDOSCOPY;  Service: Gastroenterology;;   ESOPHAGOGASTRODUODENOSCOPY  10/18/2006   Done due to chronic GERD: Normal, bx showed no barrett's esophagus (Dr. Rollin)   EYE SURGERY  01/08/2023   FLEXOR TENDON REPAIR Left 10/02/2016   Procedure: LEFT RING FINGER WOUND EXPORATION AND FLEXOR  TENDON REPAIR AND NERVE REPAIR;  Surgeon: Alm Hummer, MD;  Location: [facility] OR;  Service: Orthopedics;  Laterality: Left;   INSERT / REPLACE / REMOVE PACEMAKER  02/05/2012   dual chamber, sinus node dysfunction, sinus arrest, PAF, Medtronic Revo serial#-[id number] H: last checked 05/2015   JOINT REPLACEMENT  2017   LUMBAR LAMINECTOMY Left 1976   L4-5   PACEMAKER REMOVAL  01/04/2017   PERMANENT PACEMAKER INSERTION N/A 02/05/2012   Procedure: PERMANENT PACEMAKER INSERTION;  Surgeon: Jerel Balding, MD; Generator Medtronic [place] model [place] serial number [id number] H Laterality: N/A;   POLYPECTOMY  07/08/2021   Procedure: POLYPECTOMY;  Surgeon: Rollin Dover, MD;  Location: [facility];  Service: Endoscopy;;   RETINAL DETACHMENT SURGERY Left ~ 1999   REVERSE SHOULDER ARTHROPLASTY Left 2018   Left shoulder reverse [facility] in [place]).   RIGHT/LEFT HEART CATH AND CORONARY ANGIOGRAPHY N/A 03/22/2018   EF 30-35%, no CAD.  Procedure: RIGHT/LEFT HEART CATH AND CORONARY ANGIOGRAPHY;  Surgeon: Cherrie Toribio SAUNDERS, MD;  Location: [facility] INVASIVE CV LAB;  Service: Cardiovascular;  Laterality: N/A;   SAVORY DILATION N/A 10/19/2023   Procedure: EGD, WITH DILATION USING SAVARY-GILLIARD DILATOR OVER GUIDEWIRE;  Surgeon: Rollin Dover, MD;  Location: [facility];  Service: Gastroenterology;  Laterality: N/A;   SPINE SURGERY  1972   TRANSTHORACIC ECHOCARDIOGRAM  08/25/10; 05/2012; 03/23/16;12/2017   mild asymmetric LVH, normal systolic function, normal diastolic fxn, mild-to-mod mitral regurg, mild aortic valve sclerosis and trace AI, mild aortic root dilatation. 2014 f/u showed EF 40-45%, mod LAE, A FIB.  02/2016 EF 40%, diffuse hypokinesis, grade 2 DD. 12/2017 EF 35-40%,diffuse hypokin,grd III DD, mild MR   WISDOM TOOTH EXTRACTION  06/27/2021   EF 50-55%, mild aortic root/ascending aorta dilatation (41-42 mm).    Outpatient Medications Prior to Visit  Medication Sig Dispense Refill   acetaminophen   (TYLENOL ) 650 MG CR tablet Take 650 mg by mouth every 8 (eight) hours as needed for pain.     COMBIGAN  0.2-0.5 % ophthalmic solution Place 1 drop into both eyes every 12 (twelve) hours.     ENTRESTO  97-103 MG TAKE 1 TABLET TWO TIMES A DAY 180 tablet 3   eplerenone  (INSPRA ) 25 MG tablet TAKE 1 TABLET DAILY 90 tablet 0   gabapentin  (NEURONTIN ) 300 MG capsule TAKE 2 CAPSULES TWICE A DAY 360 capsule 3   insulin  aspart (NOVOLOG  FLEXPEN) 100 UNIT/ML FlexPen Inject 10 units before each meal (Patient taking differently: Inject 10-12 Units into the skin 3 (three) times daily with meals.) 30 mL 3   insulin  glargine (LANTUS  SOLOSTAR) 100 UNIT/ML  Solostar Pen Inject 30-34 Units into the skin daily. (Patient taking differently: Inject 28 Units into the skin daily.)     JARDIANCE  10 MG TABS tablet TAKE 1 TABLET DAILY 90 tablet 3   latanoprost  (XALATAN ) 0.005 % ophthalmic solution Place 1 drop into both eyes at bedtime.     lidocaine  (LIDODERM ) 5 % PLACE 1 PATCH ON THE SKIN DAILY, REMOVE AND DISCARD PATCH WITHIN 12 HOURS OR AS DIRECTED BY DOCTOR (OFFICE VISIT NEEDED FOR FURTHER REFILLS) 90 patch 3   meclizine  (ANTIVERT ) 25 MG tablet TAKE 1 TABLET THREE TIMES A DAY AS NEEDED FOR DIZZINESS 270 tablet 0   methocarbamol  (ROBAXIN ) 500 MG tablet Take 2 tablets (1,000 mg total) by mouth every 6 (six) hours as needed for muscle spasms. 56 tablet 0   omeprazole  (PRILOSEC) 40 MG capsule Take 1 capsule (40 mg total) by mouth daily. 90 capsule 1   oxybutynin  (DITROPAN -XL) 10 MG 24 hr tablet Take 1 tablet (10 mg total) by mouth at bedtime. 90 tablet 0   polyethylene glycol (MIRALAX ) 17 g packet Take 17 g by mouth daily. 14 each 0   Rivaroxaban  (XARELTO ) 15 MG TABS tablet Take 1 tablet (15 mg total) by mouth daily with supper. 90 tablet 0   rOPINIRole  (REQUIP ) 1 MG tablet Take 1 tablet (1 mg total) by mouth at bedtime. 90 tablet 3   rosuvastatin  (CRESTOR ) 20 MG tablet Take 1 tablet (20 mg total) by mouth at bedtime. 90 tablet 1    sertraline  (ZOLOFT ) 100 MG tablet TAKE 1 TABLET DAILY 90 tablet 0   Oxycodone  HCl 10 MG TABS Take 10 mg by mouth every 4 (four) hours as needed (pain).     Semaglutide , 1 MG/DOSE, 4 MG/3ML SOPN Inject 1 mg as directed once a week. (Patient not taking: Reported on 04/16/2024) 9 mL 3   No facility-administered medications prior to visit.    No Known Allergies  Review of Systems As per HPI  PE:    04/16/2024    2:06 PM 03/28/2024    4:01 PM 03/25/2024   11:07 AM  Vitals with BMI  Height 5' 11 5' 11 5' 11  Weight 211 lbs 3 oz 207 lbs 201 lbs 13 oz  BMI 29.47 28.88 28.16  Systolic 81 120 90  Diastolic 48 68 62  Pulse 83 87 81    Physical Exam  Gen: Alert, well appearing.  Patient is oriented to person, place, time, and situation. AFFECT: pleasant, lucid thought and speech. No further exam today  LABS:  Last CBC Lab Results  Component Value Date   WBC 5.8 03/20/2024   HGB 14.1 03/20/2024   HCT 42.5 03/20/2024   MCV 89.1 03/20/2024   MCH 29.6 03/20/2024   RDW 12.6 03/20/2024   PLT 160 03/20/2024   Last metabolic panel Lab Results  Component Value Date   GLUCOSE 125 (H) 03/20/2024   NA 136 03/20/2024   K 4.0 03/20/2024   CL 103 03/20/2024   CO2 22 03/20/2024   BUN 18 03/20/2024   CREATININE 1.40 (H) 03/20/2024   GFRNONAA 50 (L) 03/20/2024   CALCIUM  8.5 (L) 03/20/2024   PROT 6.1 (L) 03/20/2024   ALBUMIN 3.1 (L) 03/20/2024   BILITOT 0.6 03/20/2024   ALKPHOS 122 03/20/2024   AST 32 03/20/2024   ALT 25 03/20/2024   ANIONGAP 11 03/20/2024   IMPRESSION AND PLAN:  #1 hypotension. Currently asymptomatic.  Today's blood pressure here is pretty consistent with his baseline.  Okay to  continue Inspra  25 mg a day and Entresto  97-103 1 tab twice daily.   #2 overactive bladder. We will try Myrbetriq  25 mg daily and continue oxybutynin  XL 10 mg a night.  #3 subacute acute left thigh and knee pain. Unknown etiology.  Not clearly radicular. Also not clearly  attributable to the abnormalities noted on his 03/03/24 plain films (patellofemoral joint degenerative changes and a sclerotic ill-defined lesion along the medial cortex of the distal femoral diaphysis.) and his 03/11/24 CT of the knee (sclerotic lesion of distal femoral metadiaphysis, nonspecific). 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  He is NOT currently a candidate for MRI under [facility]'s current protocols.    He was seen in the hospital by orthopedic surgeon, Dr. Ozell Ada. He has an appointment to see Dr. Ada as an outpatient early next month. I refilled his oxycodone  10 mg tabs today, 1 tab twice daily as needed, #60.  An After Visit Summary was printed and given to the patient.  FOLLOW UP: Return for 3-4 wks follow up overactive bladder.  Signed:  Gerlene Hockey, MD           04/16/2024

## 2024-04-21 ENCOUNTER — Telehealth: Payer: Self-pay

## 2024-04-21 ENCOUNTER — Other Ambulatory Visit (HOSPITAL_COMMUNITY): Payer: Self-pay

## 2024-04-21 DIAGNOSIS — Z7952 Long term (current) use of systemic steroids: Secondary | ICD-10-CM | POA: Diagnosis not present

## 2024-04-21 DIAGNOSIS — M9983 Other biomechanical lesions of lumbar region: Secondary | ICD-10-CM | POA: Diagnosis not present

## 2024-04-21 DIAGNOSIS — I700 Atherosclerosis of aorta: Secondary | ICD-10-CM | POA: Diagnosis not present

## 2024-04-21 DIAGNOSIS — M11262 Other chondrocalcinosis, left knee: Secondary | ICD-10-CM | POA: Diagnosis not present

## 2024-04-21 DIAGNOSIS — N183 Chronic kidney disease, stage 3 unspecified: Secondary | ICD-10-CM | POA: Diagnosis not present

## 2024-04-21 DIAGNOSIS — Z4789 Encounter for other orthopedic aftercare: Secondary | ICD-10-CM | POA: Diagnosis not present

## 2024-04-21 DIAGNOSIS — Z7985 Long-term (current) use of injectable non-insulin antidiabetic drugs: Secondary | ICD-10-CM | POA: Diagnosis not present

## 2024-04-21 DIAGNOSIS — I130 Hypertensive heart and chronic kidney disease with heart failure and stage 1 through stage 4 chronic kidney disease, or unspecified chronic kidney disease: Secondary | ICD-10-CM | POA: Diagnosis not present

## 2024-04-21 DIAGNOSIS — E1122 Type 2 diabetes mellitus with diabetic chronic kidney disease: Secondary | ICD-10-CM | POA: Diagnosis not present

## 2024-04-21 DIAGNOSIS — E785 Hyperlipidemia, unspecified: Secondary | ICD-10-CM | POA: Diagnosis not present

## 2024-04-21 DIAGNOSIS — I5022 Chronic systolic (congestive) heart failure: Secondary | ICD-10-CM | POA: Diagnosis not present

## 2024-04-21 DIAGNOSIS — I442 Atrioventricular block, complete: Secondary | ICD-10-CM | POA: Diagnosis not present

## 2024-04-21 DIAGNOSIS — I4821 Permanent atrial fibrillation: Secondary | ICD-10-CM | POA: Diagnosis not present

## 2024-04-21 DIAGNOSIS — Z9181 History of falling: Secondary | ICD-10-CM | POA: Diagnosis not present

## 2024-04-21 DIAGNOSIS — Z981 Arthrodesis status: Secondary | ICD-10-CM | POA: Diagnosis not present

## 2024-04-21 DIAGNOSIS — Z7984 Long term (current) use of oral hypoglycemic drugs: Secondary | ICD-10-CM | POA: Diagnosis not present

## 2024-04-21 DIAGNOSIS — M1712 Unilateral primary osteoarthritis, left knee: Secondary | ICD-10-CM | POA: Diagnosis not present

## 2024-04-21 DIAGNOSIS — M48061 Spinal stenosis, lumbar region without neurogenic claudication: Secondary | ICD-10-CM | POA: Diagnosis not present

## 2024-04-21 DIAGNOSIS — Z7901 Long term (current) use of anticoagulants: Secondary | ICD-10-CM | POA: Diagnosis not present

## 2024-04-21 DIAGNOSIS — Z950 Presence of cardiac pacemaker: Secondary | ICD-10-CM | POA: Diagnosis not present

## 2024-04-21 DIAGNOSIS — Z794 Long term (current) use of insulin: Secondary | ICD-10-CM | POA: Diagnosis not present

## 2024-04-21 NOTE — Telephone Encounter (Signed)
 Pharmacy Patient Advocate Encounter   Received notification from CoverMyMeds that prior authorization for MIRABEGRON  ER (MYRBETRIQ ) 25 MG TB24 TABLET IS required/requested.   Insurance verification completed.   The patient is insured through [employer] .   Per test claim: PA required; PA submitted to above mentioned insurance via Latent Key/confirmation #/EOC [id number] Status is pending

## 2024-04-21 NOTE — Telephone Encounter (Signed)
 noted

## 2024-04-21 NOTE — Telephone Encounter (Signed)
 Please complete PA for mirabegron  ER (MYRBETRIQ ) 25 MG TB24 tablet

## 2024-04-22 ENCOUNTER — Other Ambulatory Visit (HOSPITAL_COMMUNITY): Payer: Self-pay

## 2024-04-22 ENCOUNTER — Other Ambulatory Visit: Payer: Self-pay | Admitting: Pharmacist

## 2024-04-22 DIAGNOSIS — Z4789 Encounter for other orthopedic aftercare: Secondary | ICD-10-CM | POA: Diagnosis not present

## 2024-04-22 DIAGNOSIS — M1712 Unilateral primary osteoarthritis, left knee: Secondary | ICD-10-CM | POA: Diagnosis not present

## 2024-04-22 DIAGNOSIS — Z981 Arthrodesis status: Secondary | ICD-10-CM | POA: Diagnosis not present

## 2024-04-22 DIAGNOSIS — I5022 Chronic systolic (congestive) heart failure: Secondary | ICD-10-CM | POA: Diagnosis not present

## 2024-04-22 DIAGNOSIS — I130 Hypertensive heart and chronic kidney disease with heart failure and stage 1 through stage 4 chronic kidney disease, or unspecified chronic kidney disease: Secondary | ICD-10-CM | POA: Diagnosis not present

## 2024-04-22 DIAGNOSIS — I4821 Permanent atrial fibrillation: Secondary | ICD-10-CM | POA: Diagnosis not present

## 2024-04-22 NOTE — Telephone Encounter (Signed)
 Pharmacy Patient Advocate Encounter  Received notification from [facility] that Prior Authorization for Myrbetriq  25MG  er tablets   has been APPROVED from 03/23/2024 to 07/23/2098. Ran test claim, Copay is $16.00.  PLEASE BE ADVISED PLAN WILL ONLY PAY FOR BRAND (MYRBETRIQ ).   This test claim was processed through [facility]- copay amounts may vary at other pharmacies due to pharmacy/plan contracts, or as the patient moves through the different stages of their insurance plan.   PA #/Case ID/Reference #: [id number]

## 2024-04-22 NOTE — Progress Notes (Signed)
 04/22/2024 Name: Mason [name] MRN: [id number] DOB: [birth year]  Chief Complaint  Patient presents with   Medication Management    Mason [name] is a 83 y.o. year old male who presented for a telephone visit.   They were referred to the pharmacist by their Case Management Team  for assistance in managing complex medication management.    Subjective:  Mason [name] had back surgery 02/25/2024. Had left-sided L2-3 and L3-4 retroperitoneal interbody decompression and fusion with posterior percutaneous fixation.   He has continued to have pain post surgery. He had follow up with Dr Louis 03/27/2024. He states pain since surgery is still not controlled and has stayed the same since I spoke with him 03/20/2024. He does feel that his mobility has improved a little since 03/20/2024 but still having fatigue and weakness.   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   03/11/2024 - ED Visit for post operative pain Meds prescribed at discharge: prednisone  20mg  - take 2 tabs = 40mg  daily for 4 days and hydromorphone  2mg  - take 0.5 tablet = 1mg  every 6 hours as needed for severe pain   03/16/2024 to 03/18/2025 - Admission for intractable pain Medications stopped at discharge - metoprolol  ER 50mg  daily  Medications changed at discharge - lowered dose of Xarelto  from 20mg  to 15mg  daily (due to changes in renal function)  03/20/2024 - ED Visit for hypotension. Blood pressure was 64/42 at PCP visit on 03/20/2024 - sent to ED by EMS. Patient has given fluids and blood pressure improved. Discharged home. No medication changes noted.     Care Team: Primary Care Provider: Candise Aleene DEL, MD ; Next Scheduled Visit: 05/09/2024 Cardiologist: Dr Francyne; Next Scheduled Visit: 04/24/2024 Endocrinologist Dr Trixie; Next Scheduled Visit: 05/29/2024 Neurosurgeon: Dr Louis; Next Scheduled Visit: in 1 or 2 weeks - early October 2025.  Ortho - Dr Georgina; New patient visit for knee pain - 05/01/2024  Medication Access/Adherence  Current Pharmacy:  [id number] - [place], [place] - [address] [place] [address] Phone: [phone] Fax: [phone]  [facility] - [place], [place] - [address] [place] [address] Phone: [phone] Fax: [phone]  [facility] [place] (New Address) - [place], [place] - [address] [facility] AT Previously: Viviana Mulligan, [place] [address] [place] [address] Phone: [phone] Fax: [phone]   Patient reports affordability concerns with their medications: No  Patient reports access/transportation concerns to their pharmacy: No  Patient reports adherence concerns with their medications:  No   Wife helps with medication management and administration.   Current diabetes regimen:  Lantus  28 units at bedtime  Novolog  18 units with meals and 3 units with snacks Jardiance  10mg  daily.   He is still holding Ozempic  due to low oral intake / waiting to see if he might need additional surgery.   Home blood glucose readings have been  higher recently with more postprandial excursions, Patient states sometimes he take Novolog  after meals instead of with meals.    Taking Xarelto  for afib. Previously was taking 20mg  daily but was recently lowered to 15mg  daily. Suspect dose adjusted during hospitalization for renal and weight changes.  Wt = 200 lbs / Ht = 5'11 / Scr = 1.51 - hospital values Wt =  211lbs / Ht = 5'11 / last Scr = 1.4 - most recent values Estimated Creatinine Clearance based on these measures is now improved from 49 mL/min to 53 mL/min  - recommended to decreased dose to 15mg  daily if CrCl is < 50. He recently filled Xarelto  15mg  daily. He will see cardiologist 04/24/2024 who will re-evaluate Xarelto  dose.   Patient reports he has not received oxycodone  or Myrbetriq  from [facility] yet - ordered last week 04/16/2024. Looks like Myrbetriq  needed prior authorization / change to brand due to formulary coverage.   Objective:  BP Readings from Last 3 Encounters:  04/16/24 (!) 81/48  03/28/24 120/68  03/25/24 90/62     Lab Results  Component Value Date   HGBA1C 7.9 (A) 03/28/2024    Lab Results  Component Value Date   CREATININE 1.40 (H) 03/20/2024   BUN 18 03/20/2024   NA 136 03/20/2024   K 4.0 03/20/2024   CL 103 03/20/2024   CO2 22 03/20/2024    Lab Results  Component Value Date   CHOL 116 04/06/2023   HDL 42.20 04/06/2023   LDLCALC 45 04/06/2023   LDLDIRECT 103.0 06/22/2020   TRIG 145.0 04/06/2023   CHOLHDL 3 04/06/2023    Medications Reviewed Today   Medications were not reviewed in this encounter       Assessment/Plan:   Medication Management / Polypharmacy:  - Reviewed medication list and updated - Continue to monitor serum Creatinine. If Creatinine clearance still > 50 mL/min with next serum creatinine check, then consider changing pack to Xarelto  20mg  dose.  - Reminded patient to take Novolog  10 to 15 minutes prior to meals for best result in preventing post prandial blood glucose increases.  - Called [facility] to check on Myrbetriq  and oxycodone  prescriptions.  They only received Rx for oxycodone  - has been shipped 9/26 with expected delivery date of 04/24/2024.  Rx for Myrbetriq  was sent to [facility] and not [facility]. Had to leave message asking that they fill for brand Myrberiq due to preferred on patients insurance.    Follow Up Plan: 2 - 4 weeks.   Madelin Ray, PharmD Clinical Pharmacist [facility] Primary Care  Population Health [phone]

## 2024-04-23 ENCOUNTER — Ambulatory Visit: Payer: Medicare Other

## 2024-04-23 DIAGNOSIS — I4821 Permanent atrial fibrillation: Secondary | ICD-10-CM | POA: Diagnosis not present

## 2024-04-23 MED ORDER — MIRABEGRON ER 25 MG PO TB24: ORAL_TABLET | ORAL | 1 refills | Status: DC

## 2024-04-23 NOTE — Addendum Note (Signed)
 Addended by: CLAUDENE SHANDA ORN on: 04/23/2024 08:27 AM   Modules accepted: Orders

## 2024-04-24 ENCOUNTER — Encounter: Payer: Self-pay | Admitting: Cardiovascular Disease

## 2024-04-24 ENCOUNTER — Ambulatory Visit: Attending: Cardiovascular Disease | Admitting: Cardiovascular Disease

## 2024-04-24 VITALS — BP 90/60 | HR 94 | Ht 71.0 in | Wt 213.9 lb

## 2024-04-24 DIAGNOSIS — I4821 Permanent atrial fibrillation: Secondary | ICD-10-CM | POA: Insufficient documentation

## 2024-04-24 DIAGNOSIS — D6869 Other thrombophilia: Secondary | ICD-10-CM | POA: Diagnosis not present

## 2024-04-24 DIAGNOSIS — Z95810 Presence of automatic (implantable) cardiac defibrillator: Secondary | ICD-10-CM | POA: Insufficient documentation

## 2024-04-24 DIAGNOSIS — I739 Peripheral vascular disease, unspecified: Secondary | ICD-10-CM | POA: Insufficient documentation

## 2024-04-24 DIAGNOSIS — E785 Hyperlipidemia, unspecified: Secondary | ICD-10-CM | POA: Insufficient documentation

## 2024-04-24 DIAGNOSIS — I77810 Thoracic aortic ectasia: Secondary | ICD-10-CM | POA: Insufficient documentation

## 2024-04-24 DIAGNOSIS — I442 Atrioventricular block, complete: Secondary | ICD-10-CM | POA: Diagnosis not present

## 2024-04-24 DIAGNOSIS — I5022 Chronic systolic (congestive) heart failure: Secondary | ICD-10-CM | POA: Insufficient documentation

## 2024-04-24 LAB — CUP PACEART REMOTE DEVICE CHECK
Battery Remaining Longevity: 4 mo
Battery Voltage: 2.81 V
Brady Statistic AP VP Percent: 0 %
Brady Statistic AP VS Percent: 0 %
Brady Statistic AS VP Percent: 0 %
Brady Statistic AS VS Percent: 0 %
Brady Statistic RA Percent Paced: 0 %
Brady Statistic RV Percent Paced: 99.89 %
Date Time Interrogation Session: 20251001043725
HighPow Impedance: 65 Ohm
Implantable Lead Connection Status: 753985
Implantable Lead Connection Status: 753985
Implantable Lead Connection Status: 753985
Implantable Lead Implant Date: 20130715
Implantable Lead Implant Date: 20180614
Implantable Lead Implant Date: 20180614
Implantable Lead Location: 753858
Implantable Lead Location: 753859
Implantable Lead Location: 753860
Implantable Lead Model: 4598
Implantable Pulse Generator Implant Date: 20180614
Lead Channel Impedance Value: 188.1 Ohm
Lead Channel Impedance Value: 205.2 Ohm
Lead Channel Impedance Value: 205.2 Ohm
Lead Channel Impedance Value: 230.327
Lead Channel Impedance Value: 256.5 Ohm
Lead Channel Impedance Value: 304 Ohm
Lead Channel Impedance Value: 342 Ohm
Lead Channel Impedance Value: 399 Ohm
Lead Channel Impedance Value: 418 Ohm
Lead Channel Impedance Value: 513 Ohm
Lead Channel Impedance Value: 513 Ohm
Lead Channel Impedance Value: 532 Ohm
Lead Channel Impedance Value: 608 Ohm
Lead Channel Impedance Value: 665 Ohm
Lead Channel Impedance Value: 760 Ohm
Lead Channel Impedance Value: 760 Ohm
Lead Channel Impedance Value: 779 Ohm
Lead Channel Impedance Value: 874 Ohm
Lead Channel Pacing Threshold Amplitude: 0.5 V
Lead Channel Pacing Threshold Amplitude: 0.625 V
Lead Channel Pacing Threshold Pulse Width: 0.4 ms
Lead Channel Pacing Threshold Pulse Width: 1 ms
Lead Channel Sensing Intrinsic Amplitude: 0.625 mV
Lead Channel Sensing Intrinsic Amplitude: 13.75 mV
Lead Channel Sensing Intrinsic Amplitude: 13.75 mV
Lead Channel Setting Pacing Amplitude: 1 V
Lead Channel Setting Pacing Amplitude: 2 V
Lead Channel Setting Pacing Pulse Width: 0.4 ms
Lead Channel Setting Pacing Pulse Width: 1 ms
Lead Channel Setting Sensing Sensitivity: 0.3 mV
Zone Setting Status: 755011

## 2024-04-24 MED ORDER — FUROSEMIDE 40 MG PO TABS: 40.0000 mg | ORAL_TABLET | ORAL | 11 refills | Status: AC | PRN

## 2024-04-24 NOTE — Patient Instructions (Signed)
 Medication Instructions:    Use only as needed for weight gain /or edema( swelling) -- Furosemide  ( Lasix ) 40 mg   *If you need a refill on your cardiac medications before your next appointment, please call your pharmacy*   Lab Work:  Not needed   Testing/Procedures:  Not needed  Follow-Up: At [name], you and your health needs are our priority.  As part of our continuing mission to provide you with exceptional heart care, we have created designated Provider Care Teams.  These Care Teams include your primary Cardiologist (physician) and Advanced Practice Providers (APPs -  Physician Assistants and Nurse Practitioners) who all work together to provide you with the care you need, when you need it.     Your next appointment:   12 month(s)  The format for your next appointment:   In Person  Provider:   Jerel Balding, MD  Your physician recommends that you schedule a follow-up appointment in:  4 months to see Dr Inocencio  - ICD CHANGE  Other Instructions

## 2024-04-24 NOTE — Progress Notes (Signed)
 `   Cardiology Office Note    Date:  04/24/2024   ID:  Mason [name], DOB 02-Aug-1940, MRN [id number]  PCP:  Candise Aleene DEL, MD  Cardiologist:   Jerel Balding, MD   Chief Complaint  Patient presents with   ICD check    History of Present Illness:  Mason [name] is a 83 y.o. male with nonischemic cardiomyopathy, permanent atrial fibrillation and complete heart block who underwent implantation of a CRT-D in June 2018 (Medtronic [place]) for deteriorating left ventricular systolic function.  He is pacemaker dependent.  He is doing really well from a cardiovascular point of view, but is recovering very slowly from lumbar spine surgery.  He is able to walk without support, but finds that his activity tolerance is severely limited.  He sounds a little depressed.  He is planning to go out on his pontoon boat with his buddies tomorrow.  Otherwise he has NYHA functional class I.  Before back surgery activity level was typically 2-3 hours a day, now slowly recovering but still only 0.7 hours a day.  Denies any problems with chest pain or shortness of breath.  Has not had edema, orthopnea, PND, falls or bleeding problems, focal neurological complaints.  Biventricular pacemaker defibrillator is functioning well, generator is approaching RRT (anticipated in roughly 5 months).  He has permanent atrial fibrillation and complete heart block.  His device is programmed VVIR.  He has 100% ventricular paced rhythm and is pacemaker dependent.  LV pacing is LV 3-RV coil configuration.  He has not had problems with heart failure in a very long time, but his OptiVol shows evidence of fluid accumulation since his surgery about 2 months ago.  He lost substantial weight after his back surgery.  He went from 220 pounds down to about 204 pounds, now rebounded to about 214 pounds on our office scale today.  His most recent echocardiogram performed 06/29/2021 showed improvement in LV systolic function with ejection fraction  that was estimated at 50-55% (53% by 3D volume measurement) he has mild dilation of the ascending aorta at a maximum of about 42 mm by echo.  A similar measurement was obtained by CT of the chest at 4.3-4.2 cm (Nov 25, 2021).  His ascending aorta has been reevaluated by CT of the chest in June 2024 and in June 2025, still measuring 4.2 cm  His ECG performed 03/20/2024 shows a distinctive positive R wave in lead V1 consistent with effective LV capture, although the QRS remains quite broad at about 174 ms (RBBB plus LAFB pattern).  His lipid profile is excellent with an LDL cholesterol 45 normal HDL and triglycerides, but glycemic control remains less than perfect with a hemoglobin A1c of 7.9% (this has deteriorated since last year).  He has moderately abnormal renal function with a creatinine of 1.4, corresponding to GFR has been 45-50 over the last several months.  His dose of Xarelto  was decreased to 15 mg daily.  On maximum dose Entresto , on SGLT2 inhibitor and on metoprolol  succinate in maximum tolerated dose.  Switched from spironolactone  to eplerenone  due to gynecomastia, with resolution of the side effects.  He has a prescription for as needed extra doses of furosemide .  He does not have coronary or other vascular disorders, but has numerous risk factors including type 2 diabetes, hyperlipidemia, mild obesity. He has a history of gout.  He has gained about 20 pounds since his last appointment.  He is now taking both Lantus  and Jardiance .  The  cause of Don's cardiomyopathy remains uncertain.  He initially presented with a heart attack and [facility] for 3 to 4 days in 1976.  He has had a depressed left ventricular systolic function for many years.  Cardiac catheterization most recently performed in 2019 does show nonobstructive atherosclerosis.  He has a Theatre manager including 2 stints in [place] and he knows that he was exposed to [facility].  He underwent right and left heart catheterization for optimization of heart failure therapy on March 22, 2018.  Right atrial pressure was 7 mmHg, wedge pressure was 13 and PA pressure was 30/10 (mean 22) millimeters Hg.  The cardiac index was 2.0.  The coronary arteries had only minimal atherosclerosis.  Radial artery access was difficult.  On the catheterization he developed transient weakness of the left upper extremity and left facial droop, from which he recovered fully thankfully.  His dose of beta-blocker was reduced.  He is not on diuretics.  He subsequently underwent cardiopulmonary stress testing on September 18 and did quite well with a mildly reduced peak VO2 of 16.8 mL/kg/min (79% of control).  There was still evidence of drug-induced chronotropic incompetence with a maximum heart rate only reached 74% of predicted.     Past Medical History:  Diagnosis Date   AICD (automatic cardioverter/defibrillator) present 2018   MDT CRT-D.  Fatigue-->completely pacer dependent.  Pacer settings adjusted 12/2017 to allow more chronotrophc variance with ADL's//exertion.   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   02/2016 EF 40%, diffuse hypokinesis, grade 2 DD.  Myoc perf imaging showed EF 32% 04/2016.  Pt upgraded to CRT-D 01/04/17.   Chronic renal insufficiency, stage III (moderate) 2015   CrCl about 60 ml/min   Clotting disorder    Complete heart block (HCC)    Has dual chamber pacer.   COVID-19 virus infection 01/05/2021   paxlovid   Depression    DOE (dyspnea on exertion)    NYHA class II/III CHF   Dyspnea 2021   with exertion, bending over   Dysrhythmia    A.  Fib   Episodic low back pain 01/22/2013   w/intermittent radiculitis (12/2014 his neurologist referred him to pain mgmt for epidural steroid injection)   Erectile dysfunction 2019   due to zoloft --urol rx'd viagra   GERD (gastroesophageal reflux disease)    Glaucoma 2017   H/O tilt table evaluation 11/02/2005   negative   Helicobacter pylori gastritis 01/2016   History of adenomatous polyp of colon 10/12/2011   Dr. Rollin (3 right side of colon- tubular adenomas removed)   History of cardiovascular stress test 05/28/2012   no ischemia, EF 37%, imaging results are unchanged and within normal variance   History of chronic prostatitis    History of kidney stones    History of vertigo    + Hx of posterior HA's.  Neuro (Dr. Maurice) eval 2011.  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   Hyperlipidemia    Hypertension    Lumbar spondylosis    lumbosacral radiculopathy at L4 by EMG testing, right  foot drop (neurologist is Dr. Birder with [facility] in [place])--neurologist referred him to neurosurgery   Migraine    used to have them all the time; none for years (01/04/2017)   Myocardial infarction [facility]) ?1970s   not entirely certain of this   Nephrolithiasis 07/2012   Left UVJ 2 mm stone with dilation of renal collecting system and slight hydroureter on right   Neuropathy    NICM (nonischemic cardiomyopathy) (HCC)    a. 02/2018 Cath: LM nl, LAD min irregs, LCX no, RCA 20d. [id number]. Fick CO/CI 4.4/2.0.   Osteoarthritis, multiple sites    Shoulders, back, knees   Osteoporosis    Pacemaker 02/05/2012   dual chamber, complete heart block, meddtronic revo, lasted checked 12/2015.  Since no CAD on cath 05/2016, cards recommends upgrade to CRT-D.   Peripheral vascular disease    Peripheral Artery Disease   Permanent atrial fibrillation (HCC)    DCCV 07/09/13-converted, lasted two days, then back into  afib--needs lifetime anticoagulation (Xarelto  as of 09/2014)   Prostate cancer screening 09/2017   done by urol annually (normal prostate exam documented + PSA 0.84 as of 10/01/17 urol f/u.  10/2018 urol f/u PSA 0.6, no prostate nodule.   Rectus diastasis    Right ankle sprain 08/2017   w/distal fibula avulsion fx noted on u/s but not plain film-(Dr. Hudnall).   Skin cancer of arm, left    burned it off (01/04/2017)   TIA (transient ischemic attack)    L face and L arm weakness. Peri procedural->a. 03/22/2018 following cath. CT head neg. No MRI b/c has pacer. Likely due to embolus to distal branch of RMCA   Type II diabetes mellitus (HCC)     Past Surgical History:  Procedure Laterality Date   ABI's Bilateral 05/21/2018   normal   ANTERIOR LATERAL LUMBAR FUSION WITH PERCUTANEOUS SCREW 2 LEVEL Left 02/25/2024   Procedure: LEFT ANTERIOR LATERAL LUMBAR FUSION WITH PERCUTANEOUS PEDICLE SCREW LUMBAR TWO-LUMBAR THREE, LUMBAR THREE-LUMBAR FOUR, POSTERIOR LATERAL AND INTERBODY FUSION;  Surgeon: Louis Shove, MD;  Location: [facility] OR;  Service: Neurosurgery;  Laterality: Left;  XLIF - left - L2-L3 - L3-L4 with perc pedicle screws - Posterior Lateral and Interbody fusion   BACK SURGERY     BIV ICD INSERTION CRT-D N/A 01/04/2017   Procedure: BiV ICD ;  Surgeon: Inocencio Soyla Lunger, MD;  Location: [facility] INVASIVE CV LAB;  Service: Cardiovascular;  Laterality: N/A;   CARDIAC CATHETERIZATION N/A 06/14/2016   Minimal nonobstructive dz, EF 25-35%.  Procedure: Left Heart Cath and Coronary Angiography;  Surgeon: Peter M Swaziland, MD;  Location: [facility] INVASIVE CV LAB;  Service: Cardiovascular;  Laterality: N/A;   CARDIOVASCULAR STRESS TEST  2012   2012 nuclear perfusion study: low risk scan; 04/2016 normal myocardial perfusion imaging, EF 32%.   CARDIOVERSION  07/09/2012   Procedure: CARDIOVERSION;  Surgeon: Jerel Balding, MD;  Location: [facility] ENDOSCOPY;  Service: Cardiovascular;  Laterality: N/A;   CATARACT EXTRACTION W/  INTRAOCULAR LENS IMPLANT & ANTERIOR VITRECTOMY, BILATERAL Bilateral    CIRCUMCISION N/A 03/23/2020   Procedure: CIRCUMCISION ADULT;  Surgeon: Rosalind Zachary NOVAK, MD;  Location: [facility] ORS;  Service: Urology;  Laterality: N/A;   COLONOSCOPY W/ POLYPECTOMY  approx 2006; repeated 09/2011   Polyps on 2013 EGD as well, repeat 12/2014   COLONOSCOPY WITH PROPOFOL  N/A 07/08/2021   adenoma x 1. Procedure: COLONOSCOPY WITH PROPOFOL ;  Surgeon: Rollin Dover, MD;  Location: [facility] ENDOSCOPY;  Service: Endoscopy;  Laterality: N/A;   ESOPHAGEAL DILATION  10/19/2023   Procedure: DILATION, ESOPHAGUS;  Surgeon: Rollin Dover, MD;  Location: [facility];  Service: Gastroenterology;;   ESOPHAGOGASTRODUODENOSCOPY  10/18/2006   Done due to chronic GERD: Normal, bx showed no barrett's esophagus (Dr. Rollin)   EYE SURGERY  01/08/2023   FLEXOR TENDON REPAIR Left 10/02/2016   Procedure: LEFT RING FINGER WOUND EXPORATION AND FLEXOR TENDON REPAIR AND NERVE REPAIR;  Surgeon: Alm Hummer, MD;  Location: [facility] OR;  Service: Orthopedics;  Laterality: Left;   INSERT / REPLACE / REMOVE PACEMAKER  02/05/2012   dual chamber, sinus node dysfunction, sinus arrest, PAF, Medtronic Revo serial#-[id number] H: last checked 05/2015   JOINT REPLACEMENT  2017   LUMBAR LAMINECTOMY Left 1976   L4-5   PACEMAKER REMOVAL  01/04/2017   PERMANENT PACEMAKER INSERTION N/A 02/05/2012   Procedure: PERMANENT PACEMAKER INSERTION;  Surgeon: Jerel Balding, MD; Generator Medtronic [place] model [place] serial number [id number] H Laterality: N/A;   POLYPECTOMY  07/08/2021   Procedure: POLYPECTOMY;  Surgeon: Rollin Dover, MD;  Location: [facility];  Service: Endoscopy;;   RETINAL DETACHMENT SURGERY Left ~ 1999   REVERSE SHOULDER ARTHROPLASTY Left 2018   Left shoulder reverse [facility] in [place]).   RIGHT/LEFT HEART CATH AND CORONARY ANGIOGRAPHY N/A 03/22/2018   EF 30-35%, no CAD.  Procedure: RIGHT/LEFT HEART CATH AND CORONARY ANGIOGRAPHY;  Surgeon:  Cherrie Toribio SAUNDERS, MD;  Location: [facility] INVASIVE CV LAB;  Service: Cardiovascular;  Laterality: N/A;   SAVORY DILATION N/A 10/19/2023   Procedure: EGD, WITH DILATION USING SAVARY-GILLIARD DILATOR OVER GUIDEWIRE;  Surgeon: Rollin Dover, MD;  Location: [facility];  Service: Gastroenterology;  Laterality: N/A;   SPINE SURGERY  1972   TRANSTHORACIC ECHOCARDIOGRAM  08/25/10; 05/2012; 03/23/16;12/2017   mild asymmetric LVH, normal systolic function, normal diastolic fxn, mild-to-mod mitral regurg, mild aortic valve sclerosis and trace AI, mild aortic root dilatation. 2014 f/u showed EF 40-45%, mod LAE, A FIB.  02/2016 EF 40%, diffuse hypokinesis, grade 2 DD. 12/2017 EF 35-40%,diffuse hypokin,grd III DD, mild MR   WISDOM TOOTH EXTRACTION  06/27/2021   EF 50-55%, mild aortic root/ascending aorta dilatation (41-42 mm).    Current Medications: Outpatient Medications Prior to Visit  Medication Sig Dispense Refill   acetaminophen  (TYLENOL ) 650 MG CR tablet Take 650 mg by mouth every 8 (eight) hours as needed for pain.     COMBIGAN  0.2-0.5 % ophthalmic solution Place 1 drop into both eyes every 12 (twelve) hours.     ENTRESTO  97-103 MG TAKE 1 TABLET TWO TIMES A DAY 180 tablet 3   eplerenone  (INSPRA ) 25 MG tablet TAKE 1 TABLET DAILY 90 tablet 0   gabapentin  (NEURONTIN ) 300 MG capsule TAKE 2 CAPSULES TWICE A DAY 360 capsule 3   insulin  aspart (NOVOLOG  FLEXPEN) 100 UNIT/ML FlexPen Inject 10 units before each meal 30 mL 3   insulin  glargine (LANTUS  SOLOSTAR) 100 UNIT/ML Solostar Pen Inject 30-34 Units into the skin daily.     JARDIANCE  10 MG TABS tablet TAKE 1 TABLET DAILY 90 tablet 3   latanoprost  (XALATAN ) 0.005 % ophthalmic solution Place 1 drop into both eyes at bedtime.     lidocaine  (LIDODERM ) 5 % PLACE 1 PATCH ON THE SKIN DAILY, REMOVE AND DISCARD PATCH WITHIN 12 HOURS OR AS DIRECTED BY DOCTOR (OFFICE VISIT NEEDED FOR FURTHER REFILLS) 90 patch 3   meclizine  (ANTIVERT ) 25 MG tablet TAKE 1 TABLET THREE TIMES  A DAY AS NEEDED FOR DIZZINESS 270 tablet 0   mirabegron  ER (MYRBETRIQ ) 25 MG TB24 tablet 1 tab po  qd 90 tablet 1   omeprazole  (PRILOSEC) 40 MG capsule Take 1 capsule (40 mg total) by mouth daily. 90 capsule 1   oxybutynin  (DITROPAN -XL) 10 MG 24 hr tablet Take 1 tablet (10 mg total) by mouth at bedtime. 90 tablet 0   Oxycodone  HCl 10 MG TABS 1 tab po bid prn pain 60 tablet 0   polyethylene glycol (MIRALAX ) 17 g packet Take 17 g by mouth daily. (Patient taking differently: Take 17 g by mouth daily as needed.) 14 each 0   Rivaroxaban  (XARELTO ) 15 MG TABS tablet Take 1 tablet (15 mg total) by mouth daily with supper. 90 tablet 0   rOPINIRole  (REQUIP ) 1 MG tablet Take 1 tablet (1 mg total) by mouth at bedtime. 90 tablet 3   rosuvastatin  (CRESTOR ) 20 MG tablet Take 1 tablet (20 mg total) by mouth at bedtime. 90 tablet 1   sertraline  (ZOLOFT ) 100 MG tablet TAKE 1 TABLET DAILY 90 tablet 0   methocarbamol  (ROBAXIN ) 500 MG tablet Take 2 tablets (1,000 mg total) by mouth every 6 (six) hours as needed for muscle spasms. 56 tablet 0   Semaglutide , 1 MG/DOSE, 4 MG/3ML SOPN Inject 1 mg as directed once a week. (Patient not taking: Reported on 04/24/2024) 9 mL 3   No facility-administered medications prior to visit.     Allergies:   Patient has no known allergies.     Family History:  The patient's family history includes Cancer in his brother, brother, and sister; Heart disease in his brother, sister, and sister; Heart failure in his mother; Stroke in his father and mother.   ROS:   Please see the history of present illness.    ROS All other systems are reviewed and are negative.   PHYSICAL EXAM:   VS:  BP 90/60 (BP Location: Left Arm, Patient Position: Sitting, Cuff Size: Large)   Pulse 94   Ht 5' 11 (1.803 m)   Wt 213 lb 14.4 oz (97 kg)   SpO2 98%   BMI 29.83 kg/m      General: Alert, oriented x3, no distress, overweight.  Healthy left subclavian device site.  He appears uncomfortable when he  changes position in the chair. Head: no evidence of trauma, PERRL, EOMI, no exophtalmos or lid lag, no myxedema, no xanthelasma; normal ears, nose and oropharynx Neck: normal jugular venous pulsations and no hepatojugular reflux; brisk carotid pulses without delay and no carotid bruits Chest: clear to auscultation, no signs of consolidation by percussion or palpation, normal fremitus, symmetrical and full respiratory excursions Cardiovascular: normal position and quality of the apical impulse, regular rhythm, normal first and widely split second heart sounds, no murmurs, rubs or gallops Abdomen: no tenderness or distention, no masses by palpation, no abnormal pulsatility or arterial bruits, normal bowel sounds, no hepatosplenomegaly Extremities: no clubbing, cyanosis or edema; 2+ radial, ulnar and brachial pulses bilaterally; 2+ right femoral, posterior tibial and dorsalis pedis pulses; 2+ left femoral, posterior tibial and dorsalis pedis pulses; no subclavian or femoral bruits Neurological: grossly nonfocal Psych: Normal mood and affect    Wt Readings from Last 3 Encounters:  04/24/24 213 lb 14.4 oz (97 kg)  04/16/24 211 lb 3.2 oz (95.8 kg)  03/28/24 207 lb (93.9 kg)     Studies/Labs Reviewed:   ECHO 06/29/2021:   1. Left ventricular ejection fraction, by estimation, is 50 to 55%. Left  ventricular ejection fraction by 3D volume is 53 %. The left ventricle has  low normal function. The left ventricle  has no regional wall motion  abnormalities. Left ventricular  diastolic parameters are indeterminate. The average left ventricular  global longitudinal strain is -9.1 %. The global longitudinal strain is  abnormal.   2. Right ventricular systolic function is mildly reduced. The right  ventricular size is mildly enlarged.   3. Left atrial size was severely dilated.   4. Right atrial size was severely dilated.   5. The mitral valve is normal in structure. Mild mitral valve   regurgitation.   6. The aortic valve is tricuspid. Aortic valve regurgitation is trivial.   7. Aortic dilatation noted. 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 There is mild dilatation of  the ascending aorta, measuring 41 mm.   Comparison(s): Prior images reviewed side by side. The left ventricular  function has improved.   EKG:   EKG Interpretation Date/Time:    Ventricular Rate:    PR Interval:    QRS Duration:    QT Interval:    QTC Calculation:   R Axis:      Text Interpretation:         Recent Labs: 03/20/2024: ALT 25; BUN 18; Creatinine, Ser 1.40; Hemoglobin 14.1; Magnesium  2.0; Platelets 160; Potassium 4.0; Sodium 136   Lipid Panel    Component Value Date/Time   CHOL 116 04/06/2023 0930   TRIG 145.0 04/06/2023 0930   HDL 42.20 04/06/2023 0930   CHOLHDL 3 04/06/2023 0930   VLDL 29.0 04/06/2023 0930   LDLCALC 45 04/06/2023 0930   LDLDIRECT 103.0 06/22/2020 1015     ASSESSMENT:    1. Chronic systolic heart failure (HCC)   2. CHB (complete heart block) (HCC)   3. Permanent atrial fibrillation (HCC)   4. Acquired thrombophilia   5. Biventricular automatic implantable cardioverter defibrillator in situ   6. PAD (peripheral artery disease)   7. Dyslipidemia (high LDL; low HDL)   8. Ascending aorta dilatation       PLAN:  In order of problems listed above:  CHF: He appears clinically euvolemic and is asymptomatic, but based on his thoracic impedance may have some extra volume on board.  This started after his back surgery.  Maybe he got excessive IV fluids.  Asked him to be particularly attentive to avoid salt rich foods and to continue weighing himself daily.  He should call me if he gains more than 215 pounds on his home scale, which would roughly correspond to 220 pounds on our office scale. On maximum dose Entresto , metoprolol  succinate, empagliflozin  and eplerenone  (switched from spironolactone  due to  gynecomastia).  I gave him a prescription for furosemide  40 mg tablets that he should have available if he becomes clearly symptomatic/hypervolemic.. CHB: Maker dependent AFib: Permanent arrhythmia.  Severe biatrial enlargement.  Appropriately anticoagulated.  CHADSVasc 5 (age 59, diabetes, heart failure, hypertension).  No history of embolic events. Xarelto : Adjusted for renal dysfunction. CRT-D: Normal device function.  Pacemaker dependent with 100% ventricular pacing and a good heart rate histogram distribution.  Approaching battery depletion.  Refer to Dr. Inocencio in a few months to set up generator change. HTN: He tends to run a low blood pressure but is asymptomatic. PAD: Asymptomatic/no claudication.  Very mild abnormality in the left lower extremity ABI.   DM/obesity/dyslipidemia: Excellent lipid profile, but glycemic control could be better.    SGLT2 inhibitor should be continued (barring side effects) for both heart failure and diabetes.   Asc Ao dilation: Ascending aorta diameter  has been stable at 4.2 cm for the last 3 years.    Medication Adjustments/Labs and Tests Ordered: Current medicines are reviewed at length with the patient today.  Concerns regarding medicines are outlined above.  Medication changes, Labs and Tests ordered today are listed in the Patient Instructions below. Patient Instructions  Medication Instructions:    Use only as needed for weight gain /or edema( swelling) -- Furosemide  ( Lasix ) 40 mg   *If you need a refill on your cardiac medications before your next appointment, please call your pharmacy*   Lab Work:  Not needed   Testing/Procedures:  Not needed  Follow-Up: At [facility], you and your health needs are our priority.  As part of our continuing mission to provide you with exceptional heart care, we have created designated Provider Care Teams.  These Care Teams include your primary Cardiologist (physician) and Advanced Practice Providers  (APPs -  Physician Assistants and Nurse Practitioners) who all work together to provide you with the care you need, when you need it.     Your next appointment:   12 month(s)  The format for your next appointment:   In Person  Provider:   Jerel Balding, MD  Your physician recommends that you schedule a follow-up appointment in:  4 months to see Dr Inocencio  - ICD CHANGE  Other Instructions    Signed, Jerel Balding, MD  04/24/2024 2:08 PM    [facility] [address] [place], [place], [place]  [address] Phone: [phone]; Fax: [phone]

## 2024-04-25 ENCOUNTER — Ambulatory Visit: Payer: Self-pay | Admitting: Cardiovascular Disease

## 2024-04-25 LAB — LIPID PANEL
Chol/HDL Ratio: 2.9 ratio (ref 0.0–5.0)
Cholesterol, Total: 130 mg/dL (ref 100–199)
HDL: 45 mg/dL (ref >39)
LDL Chol Calc (NIH): 66 mg/dL (ref 0–99)
Triglycerides: 99 mg/dL (ref 0–149)
VLDL Cholesterol Cal: 19 mg/dL (ref 5–40)

## 2024-04-28 DIAGNOSIS — I5022 Chronic systolic (congestive) heart failure: Secondary | ICD-10-CM | POA: Diagnosis not present

## 2024-04-28 DIAGNOSIS — M1712 Unilateral primary osteoarthritis, left knee: Secondary | ICD-10-CM | POA: Diagnosis not present

## 2024-04-28 DIAGNOSIS — Z981 Arthrodesis status: Secondary | ICD-10-CM | POA: Diagnosis not present

## 2024-04-28 DIAGNOSIS — I130 Hypertensive heart and chronic kidney disease with heart failure and stage 1 through stage 4 chronic kidney disease, or unspecified chronic kidney disease: Secondary | ICD-10-CM | POA: Diagnosis not present

## 2024-04-28 DIAGNOSIS — Z4789 Encounter for other orthopedic aftercare: Secondary | ICD-10-CM | POA: Diagnosis not present

## 2024-04-28 DIAGNOSIS — I4821 Permanent atrial fibrillation: Secondary | ICD-10-CM | POA: Diagnosis not present

## 2024-04-28 NOTE — Progress Notes (Signed)
 Remote ICD Transmission

## 2024-05-01 ENCOUNTER — Ambulatory Visit (INDEPENDENT_AMBULATORY_CARE_PROVIDER_SITE_OTHER): Admitting: Orthopedic Surgery

## 2024-05-01 DIAGNOSIS — M23322 Other meniscus derangements, posterior horn of medial meniscus, left knee: Secondary | ICD-10-CM | POA: Diagnosis not present

## 2024-05-01 NOTE — Progress Notes (Signed)
 Remote ICD Transmission

## 2024-05-02 ENCOUNTER — Telehealth: Payer: Self-pay

## 2024-05-02 DIAGNOSIS — Z981 Arthrodesis status: Secondary | ICD-10-CM | POA: Diagnosis not present

## 2024-05-02 DIAGNOSIS — M1712 Unilateral primary osteoarthritis, left knee: Secondary | ICD-10-CM | POA: Diagnosis not present

## 2024-05-02 DIAGNOSIS — I5022 Chronic systolic (congestive) heart failure: Secondary | ICD-10-CM | POA: Diagnosis not present

## 2024-05-02 DIAGNOSIS — Z4789 Encounter for other orthopedic aftercare: Secondary | ICD-10-CM | POA: Diagnosis not present

## 2024-05-02 DIAGNOSIS — I130 Hypertensive heart and chronic kidney disease with heart failure and stage 1 through stage 4 chronic kidney disease, or unspecified chronic kidney disease: Secondary | ICD-10-CM | POA: Diagnosis not present

## 2024-05-02 DIAGNOSIS — I4821 Permanent atrial fibrillation: Secondary | ICD-10-CM | POA: Diagnosis not present

## 2024-05-02 NOTE — Telephone Encounter (Signed)
 Received fax from [facility]    Order # [id number]   [place] inbox front office

## 2024-05-03 NOTE — Progress Notes (Signed)
 Orthopedic Surgery Progress Note   Assessment: Patient is a 83 y.o. male with left knee pain   Plan: - He has undergone previous workup in the hospital including x-rays and a CT of the left knee.  The CT did show a sclerotic lesion in the distal medial aspect of the femur.  His history is inconsistent with tumor.  Workup in the hospital also included an aspirate that did not show any evidence of septic arthritis or crystalline arthropathy.  Given the duration of his symptoms for 2 months now also recommended MRI of the left knee to evaluate further -Return to office in 4 weeks to go over the MRI and decide next steps in management ___________________________________________________________________________  Subjective: Patient has had left knee pain since surgery on his lumbar spine.  He initially had thigh and knee pain but the thigh pain has gotten better.  He now just has knee pain.  He feels it along the distal aspect of the thigh and medially.  It is felt just proximal to the knee.  There is no trauma or injury recently that preceded the onset of this pain.   Physical Exam:  General: no acute distress, appears stated age Neurologic: alert, answering questions appropriately, following commands Respiratory: unlabored breathing on room air, symmetric chest rise Psychiatric: appropriate affect, normal cadence to speech  MSK:   - Right lower extremity  Mild TTP over the medial aspect of the distal thigh just proximal to the knee.  No other tenderness to palpation.  No palpable masses. Fires hip flexors, quadriceps, hamstrings, tibialis anterior, gastrocnemius and soleus, extensor hallucis longus Plantarflexes and dorsiflexes toes Sensation intact to light touch in sural, saphenous, tibial, deep peroneal, and superficial peroneal nerve distributions Foot warm and well perfused   Imaging:  None obtained at today's visit   Patient name: Mason [name] Patient MRN: [id number] Date:  05/01/2024

## 2024-05-06 ENCOUNTER — Telehealth: Payer: Self-pay | Admitting: Orthopedic Surgery

## 2024-05-06 DIAGNOSIS — Z4789 Encounter for other orthopedic aftercare: Secondary | ICD-10-CM | POA: Diagnosis not present

## 2024-05-06 DIAGNOSIS — Z981 Arthrodesis status: Secondary | ICD-10-CM | POA: Diagnosis not present

## 2024-05-06 DIAGNOSIS — I5022 Chronic systolic (congestive) heart failure: Secondary | ICD-10-CM | POA: Diagnosis not present

## 2024-05-06 DIAGNOSIS — I4821 Permanent atrial fibrillation: Secondary | ICD-10-CM | POA: Diagnosis not present

## 2024-05-06 DIAGNOSIS — M1712 Unilateral primary osteoarthritis, left knee: Secondary | ICD-10-CM | POA: Diagnosis not present

## 2024-05-06 DIAGNOSIS — I130 Hypertensive heart and chronic kidney disease with heart failure and stage 1 through stage 4 chronic kidney disease, or unspecified chronic kidney disease: Secondary | ICD-10-CM | POA: Diagnosis not present

## 2024-05-06 NOTE — Telephone Encounter (Signed)
 [facility] called and said that he patient has a Visual merchandiser and its not safe to do the MRI. CB#[phone]

## 2024-05-07 ENCOUNTER — Telehealth: Payer: Self-pay | Admitting: Orthopedic Surgery

## 2024-05-07 DIAGNOSIS — G8929 Other chronic pain: Secondary | ICD-10-CM

## 2024-05-07 NOTE — Telephone Encounter (Signed)
 Orthopedic Telephone Note  Given patient's persistent left knee pain, recommended MRI of the knee to evaluate further at her last visit.  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  However, I pointed out the fact that he got a MRI of his lumbar spine at [facility] on 03/05/2024 without any issue.  I saw him in the office after that MRI that he had had no issues with it.  This demonstrates the fact that what ever lead is left is compatible with an MRI.  I cannot think of a better test to demonstrate the fact that it is compatible than the patient going through the scanner without any issue.  Even after pointing out at this fact, the MRI techs will still not get the MRI.  So I placed an order for [facility] to get the MRI done.  Still want to see the patient after the MRI is done to go over the results.  A CT scan would not demonstrate intra-articular pathology as well as an MRI.   Ozell DELENA Ada, MD Orthopedic Surgeon

## 2024-05-09 ENCOUNTER — Encounter: Payer: Self-pay | Admitting: Family Medicine

## 2024-05-09 ENCOUNTER — Ambulatory Visit (INDEPENDENT_AMBULATORY_CARE_PROVIDER_SITE_OTHER): Admitting: Family Medicine

## 2024-05-09 ENCOUNTER — Telehealth: Payer: Self-pay

## 2024-05-09 VITALS — BP 95/62 | HR 79 | Temp 98.2°F | Ht 71.0 in | Wt 213.4 lb

## 2024-05-09 DIAGNOSIS — G8929 Other chronic pain: Secondary | ICD-10-CM | POA: Diagnosis not present

## 2024-05-09 DIAGNOSIS — Z981 Arthrodesis status: Secondary | ICD-10-CM | POA: Diagnosis not present

## 2024-05-09 DIAGNOSIS — M5442 Lumbago with sciatica, left side: Secondary | ICD-10-CM

## 2024-05-09 DIAGNOSIS — N3281 Overactive bladder: Secondary | ICD-10-CM

## 2024-05-09 DIAGNOSIS — Z4789 Encounter for other orthopedic aftercare: Secondary | ICD-10-CM | POA: Diagnosis not present

## 2024-05-09 DIAGNOSIS — I4821 Permanent atrial fibrillation: Secondary | ICD-10-CM | POA: Diagnosis not present

## 2024-05-09 DIAGNOSIS — I130 Hypertensive heart and chronic kidney disease with heart failure and stage 1 through stage 4 chronic kidney disease, or unspecified chronic kidney disease: Secondary | ICD-10-CM | POA: Diagnosis not present

## 2024-05-09 DIAGNOSIS — M1712 Unilateral primary osteoarthritis, left knee: Secondary | ICD-10-CM | POA: Diagnosis not present

## 2024-05-09 DIAGNOSIS — I5022 Chronic systolic (congestive) heart failure: Secondary | ICD-10-CM | POA: Diagnosis not present

## 2024-05-09 MED ORDER — OXYBUTYNIN CHLORIDE ER 10 MG PO TB24: 10.0000 mg | ORAL_TABLET | Freq: Every day | ORAL | 3 refills | Status: DC

## 2024-05-09 NOTE — Progress Notes (Signed)
 OFFICE VISIT  05/09/2024  CC:  Chief Complaint  Patient presents with   Medical Management of Chronic Issues    3-4 week f/u [facility]    Patient is a 83 y.o. male who presents for 3-week follow-up overactive bladder. A/P as of last visit: #1 hypotension. Currently asymptomatic.  Today's blood pressure here is pretty consistent with his baseline.  Okay to continue Inspra  25 mg a day and Entresto  97-103 1 tab twice daily.    #2 overactive bladder. We will try Myrbetriq  25 mg daily and continue oxybutynin  XL 10 mg a night.   #3 subacute acute left thigh and knee pain. Unknown etiology.  Not clearly radicular. Also not clearly attributable to the abnormalities noted on his 03/03/24 plain films (patellofemoral joint degenerative changes and a sclerotic ill-defined lesion along the medial cortex of the distal femoral diaphysis.) and his 03/11/24 CT of the knee (sclerotic lesion of distal femoral metadiaphysis, nonspecific). 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  He is NOT currently a candidate for MRI under [facility]'s current protocols.    He was seen in the hospital by orthopedic surgeon, Dr. Ozell Ada. He has an appointment to see Dr. Ada as an outpatient early next month. I refilled his oxycodone  10 mg tabs today, 1 tab twice daily as needed, #60.  INTERIM HX: His nocturia is improved.  He has no dysuria.  No significant problem with daytime frequency or urgency. He is pleased with his response to Myrbetriq  and oxybutynin .    Past Medical History:  Diagnosis Date   AICD (automatic cardioverter/defibrillator) present 2018   MDT CRT-D.  Fatigue-->completely pacer dependent.  Pacer settings adjusted 12/2017 to allow more chronotrophc variance with ADL's//exertion.   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   02/2016 EF 40%, diffuse hypokinesis, grade 2 DD.  Myoc perf imaging showed EF 32% 04/2016.  Pt upgraded to CRT-D 01/04/17.   Chronic renal insufficiency, stage III (moderate) 2015   CrCl about 60 ml/min   Clotting disorder    Complete heart block (HCC)    Has dual chamber pacer.   COVID-19 virus infection 01/05/2021   paxlovid   Depression    DOE (dyspnea on exertion)    NYHA class II/III CHF   Dyspnea 2021   with exertion, bending over   Dysrhythmia    A. Fib   Episodic low back pain 01/22/2013   w/intermittent radiculitis (12/2014 his neurologist referred him to pain mgmt for epidural steroid injection)   Erectile dysfunction 2019   due to zoloft --urol rx'd viagra   GERD (gastroesophageal reflux disease)    Glaucoma 2017   H/O tilt table evaluation 11/02/2005   negative   Helicobacter pylori gastritis 01/2016   History of adenomatous polyp of colon 10/12/2011   Dr. Rollin (3 right side of colon- tubular adenomas removed)   History of cardiovascular stress test 05/28/2012   no ischemia, EF 37%, imaging results are unchanged and within normal variance   History of chronic prostatitis    History of kidney stones  History of vertigo    + Hx of posterior HA's.  Neuro (Dr. Maurice) eval 2011.  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   Hyperlipidemia    Hypertension    Lumbar spondylosis    lumbosacral radiculopathy at L4 by EMG testing, right foot drop (neurologist is Dr. Birder with [facility] in [place])--neurologist referred him to neurosurgery   Migraine    used to have them all the time; none for  years (01/04/2017)   Myocardial infarction [facility]) ?1970s   not entirely certain of this   Nephrolithiasis 07/2012   Left UVJ 2 mm stone with dilation of renal collecting system and slight hydroureter on right   Neuropathy    NICM (nonischemic cardiomyopathy) (HCC)    a. 02/2018 Cath: LM nl, LAD min irregs, LCX no, RCA 20d. [id number]. Fick CO/CI 4.4/2.0.   Osteoarthritis, multiple sites    Shoulders, back, knees   Osteoporosis    Pacemaker 02/05/2012   dual chamber, complete heart block, meddtronic revo, lasted checked 12/2015.  Since no CAD on cath 05/2016, cards recommends upgrade to CRT-D.   Peripheral vascular disease    Peripheral Artery Disease   Permanent atrial fibrillation (HCC)    DCCV 07/09/13-converted, lasted two days, then back into afib--needs lifetime anticoagulation (Xarelto  as of 09/2014)   Prostate cancer screening 09/2017   done by urol annually (normal prostate exam documented + PSA 0.84 as of 10/01/17 urol f/u.  10/2018 urol f/u PSA 0.6, no prostate nodule.   Rectus diastasis    Right ankle sprain 08/2017   w/distal fibula avulsion fx noted on u/s but not plain film-(Dr. Hudnall).   Skin cancer of arm, left    burned it off (01/04/2017)   TIA (transient ischemic attack)    L face and L arm weakness. Peri procedural->a. 03/22/2018 following cath. CT head neg. No MRI b/c has pacer. Likely due to embolus to distal branch of RMCA   Type II diabetes mellitus (HCC)     Past Surgical History:  Procedure Laterality Date   ABI's Bilateral 05/21/2018   normal   ANTERIOR LATERAL LUMBAR FUSION WITH PERCUTANEOUS SCREW 2 LEVEL Left 02/25/2024   Procedure: LEFT ANTERIOR LATERAL LUMBAR FUSION WITH PERCUTANEOUS PEDICLE SCREW LUMBAR TWO-LUMBAR THREE, LUMBAR THREE-LUMBAR FOUR, POSTERIOR LATERAL AND INTERBODY FUSION;  Surgeon: Louis Shove, MD;  Location: [facility] OR;  Service: Neurosurgery;  Laterality: Left;  XLIF - left - L2-L3 - L3-L4 with perc pedicle screws - Posterior Lateral and Interbody  fusion   BACK SURGERY     BIV ICD INSERTION CRT-D N/A 01/04/2017   Procedure: BiV ICD ;  Surgeon: Inocencio Soyla Lunger, MD;  Location: [facility] INVASIVE CV LAB;  Service: Cardiovascular;  Laterality: N/A;   CARDIAC CATHETERIZATION N/A 06/14/2016   Minimal nonobstructive dz, EF 25-35%.  Procedure: Left Heart Cath and Coronary Angiography;  Surgeon: Peter M Swaziland, MD;  Location: [facility] INVASIVE CV LAB;  Service: Cardiovascular;  Laterality: N/A;   CARDIOVASCULAR STRESS TEST  2012   2012 nuclear perfusion study: low risk scan; 04/2016 normal myocardial perfusion imaging, EF 32%.   CARDIOVERSION  07/09/2012   Procedure: CARDIOVERSION;  Surgeon: Jerel Balding, MD;  Location: [facility] ENDOSCOPY;  Service: Cardiovascular;  Laterality: N/A;   CATARACT EXTRACTION W/ INTRAOCULAR LENS IMPLANT & ANTERIOR VITRECTOMY, BILATERAL Bilateral    CIRCUMCISION N/A 03/23/2020   Procedure: CIRCUMCISION ADULT;  Surgeon: Rosalind Zachary NOVAK, MD;  Location: [facility] ORS;  Service: Urology;  Laterality: N/A;  COLONOSCOPY W/ POLYPECTOMY  approx 2006; repeated 09/2011   Polyps on 2013 EGD as well, repeat 12/2014   COLONOSCOPY WITH PROPOFOL  N/A 07/08/2021   adenoma x 1. Procedure: COLONOSCOPY WITH PROPOFOL ;  Surgeon: Rollin Dover, MD;  Location: [facility] ENDOSCOPY;  Service: Endoscopy;  Laterality: N/A;   ESOPHAGEAL DILATION  10/19/2023   Procedure: DILATION, ESOPHAGUS;  Surgeon: Rollin Dover, MD;  Location: [facility] ENDOSCOPY;  Service: Gastroenterology;;   ESOPHAGOGASTRODUODENOSCOPY  10/18/2006   Done due to chronic GERD: Normal, bx showed no barrett's esophagus (Dr. Rollin)   EYE SURGERY  01/08/2023   FLEXOR TENDON REPAIR Left 10/02/2016   Procedure: LEFT RING FINGER WOUND EXPORATION AND FLEXOR TENDON REPAIR AND NERVE REPAIR;  Surgeon: Alm Hummer, MD;  Location: [facility] OR;  Service: Orthopedics;  Laterality: Left;   INSERT / REPLACE / REMOVE PACEMAKER  02/05/2012   dual chamber, sinus node dysfunction, sinus arrest, PAF, Medtronic Revo  serial#-[id number] H: last checked 05/2015   JOINT REPLACEMENT  2017   LUMBAR LAMINECTOMY Left 1976   L4-5   PACEMAKER REMOVAL  01/04/2017   PERMANENT PACEMAKER INSERTION N/A 02/05/2012   Procedure: PERMANENT PACEMAKER INSERTION;  Surgeon: Jerel Balding, MD; Generator Medtronic [place] model [place] serial number [id number] H Laterality: N/A;   POLYPECTOMY  07/08/2021   Procedure: POLYPECTOMY;  Surgeon: Rollin Dover, MD;  Location: [facility] ENDOSCOPY;  Service: Endoscopy;;   RETINAL DETACHMENT SURGERY Left ~ 1999   REVERSE SHOULDER ARTHROPLASTY Left 2018   Left shoulder reverse [facility] in [place]).   RIGHT/LEFT HEART CATH AND CORONARY ANGIOGRAPHY N/A 03/22/2018   EF 30-35%, no CAD.  Procedure: RIGHT/LEFT HEART CATH AND CORONARY ANGIOGRAPHY;  Surgeon: Cherrie Toribio SAUNDERS, MD;  Location: [facility] INVASIVE CV LAB;  Service: Cardiovascular;  Laterality: N/A;   SAVORY DILATION N/A 10/19/2023   Procedure: EGD, WITH DILATION USING SAVARY-GILLIARD DILATOR OVER GUIDEWIRE;  Surgeon: Rollin Dover, MD;  Location: [facility] ENDOSCOPY;  Service: Gastroenterology;  Laterality: N/A;   SPINE SURGERY  1972   TRANSTHORACIC ECHOCARDIOGRAM  08/25/10; 05/2012; 03/23/16;12/2017   mild asymmetric LVH, normal systolic function, normal diastolic fxn, mild-to-mod mitral regurg, mild aortic valve sclerosis and trace AI, mild aortic root dilatation. 2014 f/u showed EF 40-45%, mod LAE, A FIB.  02/2016 EF 40%, diffuse hypokinesis, grade 2 DD. 12/2017 EF 35-40%,diffuse hypokin,grd III DD, mild MR   WISDOM TOOTH EXTRACTION  06/27/2021   EF 50-55%, mild aortic root/ascending aorta dilatation (41-42 mm).    Outpatient Medications Prior to Visit  Medication Sig Dispense Refill   acetaminophen  (TYLENOL ) 650 MG CR tablet Take 650 mg by mouth every 8 (eight) hours as needed for pain.     COMBIGAN  0.2-0.5 % ophthalmic solution Place 1 drop into both eyes every 12 (twelve) hours.     ENTRESTO  97-103 MG TAKE 1 TABLET TWO TIMES A DAY 180 tablet 3    eplerenone  (INSPRA ) 25 MG tablet TAKE 1 TABLET DAILY 90 tablet 0   furosemide  (LASIX ) 40 MG tablet Take 1 tablet (40 mg total) by mouth as needed. For weight gain and/or edema 20 tablet 11   gabapentin  (NEURONTIN ) 300 MG capsule TAKE 2 CAPSULES TWICE A DAY 360 capsule 3   insulin  aspart (NOVOLOG  FLEXPEN) 100 UNIT/ML FlexPen Inject 10 units before each meal 30 mL 3   insulin  glargine (LANTUS  SOLOSTAR) 100 UNIT/ML Solostar Pen Inject 30-34 Units into the skin daily.     JARDIANCE  10 MG TABS tablet TAKE 1 TABLET DAILY 90 tablet 3   latanoprost  (XALATAN ) 0.005 % ophthalmic solution  Place 1 drop into both eyes at bedtime.     lidocaine  (LIDODERM ) 5 % PLACE 1 PATCH ON THE SKIN DAILY, REMOVE AND DISCARD PATCH WITHIN 12 HOURS OR AS DIRECTED BY DOCTOR (OFFICE VISIT NEEDED FOR FURTHER REFILLS) 90 patch 3   meclizine  (ANTIVERT ) 25 MG tablet TAKE 1 TABLET THREE TIMES A DAY AS NEEDED FOR DIZZINESS 270 tablet 0   mirabegron  ER (MYRBETRIQ ) 25 MG TB24 tablet 1 tab po qd 90 tablet 1   omeprazole  (PRILOSEC) 40 MG capsule Take 1 capsule (40 mg total) by mouth daily. 90 capsule 1   oxybutynin  (DITROPAN -XL) 10 MG 24 hr tablet Take 1 tablet (10 mg total) by mouth at bedtime. 90 tablet 0   Oxycodone  HCl 10 MG TABS 1 tab po bid prn pain 60 tablet 0   polyethylene glycol (MIRALAX ) 17 g packet Take 17 g by mouth daily. (Patient taking differently: Take 17 g by mouth daily as needed.) 14 each 0   Rivaroxaban  (XARELTO ) 15 MG TABS tablet Take 1 tablet (15 mg total) by mouth daily with supper. 90 tablet 0   rOPINIRole  (REQUIP ) 1 MG tablet Take 1 tablet (1 mg total) by mouth at bedtime. 90 tablet 3   rosuvastatin  (CRESTOR ) 20 MG tablet Take 1 tablet (20 mg total) by mouth at bedtime. 90 tablet 1   Semaglutide , 1 MG/DOSE, 4 MG/3ML SOPN Inject 1 mg as directed once a week. 9 mL 3   sertraline  (ZOLOFT ) 100 MG tablet TAKE 1 TABLET DAILY 90 tablet 0   No facility-administered medications prior to visit.    No Known  Allergies  Review of Systems As per HPI  PE:    05/09/2024    1:17 PM 04/24/2024    8:57 AM 04/16/2024    2:06 PM  Vitals with BMI  Height 5' 11 5' 11 5' 11  Weight 213 lbs 6 oz 213 lbs 14 oz 211 lbs 3 oz  BMI 29.78 29.85 29.47  Systolic 95 90 81  Diastolic 62 60 48  Pulse 79 94 83     Physical Exam  Gen: Alert, well appearing.  Patient is oriented to person, place, time, and situation. AFFECT: pleasant, lucid thought and speech. No further exam today  LABS:  Last CBC Lab Results  Component Value Date   WBC 5.8 03/20/2024   HGB 14.1 03/20/2024   HCT 42.5 03/20/2024   MCV 89.1 03/20/2024   MCH 29.6 03/20/2024   RDW 12.6 03/20/2024   PLT 160 03/20/2024   Last metabolic panel Lab Results  Component Value Date   GLUCOSE 125 (H) 03/20/2024   NA 136 03/20/2024   K 4.0 03/20/2024   CL 103 03/20/2024   CO2 22 03/20/2024   BUN 18 03/20/2024   CREATININE 1.40 (H) 03/20/2024   GFRNONAA 50 (L) 03/20/2024   CALCIUM  8.5 (L) 03/20/2024   PROT 6.1 (L) 03/20/2024   ALBUMIN 3.1 (L) 03/20/2024   BILITOT 0.6 03/20/2024   ALKPHOS 122 03/20/2024   AST 32 03/20/2024   ALT 25 03/20/2024   ANIONGAP 11 03/20/2024   IMPRESSION AND PLAN:  #1 overactive bladder. He is doing well currently on Myrbetriq  25 mg and oxybutynin  XL 10 mg daily.  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 He has been getting home health PT and this has been helping but he finishes next week and feels like he would like to continue this.  Will order this.  No labs needed today.  An After Visit Summary  was printed and given to the patient.  FOLLOW UP: Return in about 3 months (around 08/09/2024) for routine chronic illness f/u.  Signed:  Gerlene Hockey, MD           05/09/2024

## 2024-05-09 NOTE — Telephone Encounter (Signed)
-----   Message from Aleene VEAR Hockey sent at 05/09/2024  1:53 PM EDT ----- He is currently getting home health PT for chronic bilateral low back pain with left sciatica. It finishes up next week but he wants to do more. Will you please do an order to extend this?

## 2024-05-13 ENCOUNTER — Telehealth: Payer: Self-pay

## 2024-05-13 ENCOUNTER — Other Ambulatory Visit: Payer: Self-pay | Admitting: Pharmacist

## 2024-05-13 DIAGNOSIS — I4821 Permanent atrial fibrillation: Secondary | ICD-10-CM | POA: Diagnosis not present

## 2024-05-13 DIAGNOSIS — Z981 Arthrodesis status: Secondary | ICD-10-CM | POA: Diagnosis not present

## 2024-05-13 DIAGNOSIS — I130 Hypertensive heart and chronic kidney disease with heart failure and stage 1 through stage 4 chronic kidney disease, or unspecified chronic kidney disease: Secondary | ICD-10-CM | POA: Diagnosis not present

## 2024-05-13 DIAGNOSIS — I5022 Chronic systolic (congestive) heart failure: Secondary | ICD-10-CM | POA: Diagnosis not present

## 2024-05-13 DIAGNOSIS — M1712 Unilateral primary osteoarthritis, left knee: Secondary | ICD-10-CM | POA: Diagnosis not present

## 2024-05-13 DIAGNOSIS — Z4789 Encounter for other orthopedic aftercare: Secondary | ICD-10-CM | POA: Diagnosis not present

## 2024-05-13 NOTE — Telephone Encounter (Signed)
Yes, okay.

## 2024-05-13 NOTE — Progress Notes (Signed)
 05/13/2024 Name: Mason [name] MRN: [id number] DOB: [birth year]  Chief Complaint  Patient presents with   Medication Management    Mason [name] is a 83 y.o. year old male who presented for a telephone visit.   They were referred to the pharmacist by their Case Management Team  for assistance in managing complex medication management.    Subjective:  Mason [name] had back surgery 02/25/2024. Had left-sided L2-3 and L3-4 retroperitoneal interbody decompression and fusion with posterior percutaneous fixation.   He has continued to have pain post surgery. He had follow up with Dr Louis 03/27/2024.  He was seen by Dr Georgina in October for left knee pain. Ordered MRI of left knee. RTO 4 weeks to review MRI results. However patient has not been able to get MRI yet. He is not eligible to get MRI per current [facility] radiology protocol. Dr Georgina placed an order to [facility] for MRI but patient has not received call to schedule yet. Patient is getting phyiscal therapy which has helped with knee pain some.    Care Team: Primary Care Provider: Candise Aleene DEL, MD ; Next Scheduled Visit: 08/11/2024 Cardiologist: Dr Inocencio; Next Scheduled Visit: 08/06/2024 Endocrinologist Dr Trixie; Next Scheduled Visit: 05/29/2024 Neurosurgeon: Dr Louis; Next Scheduled Visit: in 1 or 2 weeks - early October 2025.  Ortho - Dr Georgina; for knee pain - 06/02/2024  Medication Access/Adherence  Current Pharmacy:  [id number] - [place], [place] - [address] [place] [address] Phone: [phone] Fax: [phone]  [facility] - [place], [place] - [address] [place] [address] Phone: [phone] Fax: [phone]   Patient reports affordability concerns with their medications: No  Patient reports access/transportation concerns to their pharmacy: No  Patient reports adherence  concerns with their medications:  No   Wife helps with medication management and administration.   Current diabetes regimen:  Lantus  28 units at bedtime  Novolog  18 units with meals and 3 units with snacks Jardiance  10mg  daily.   He is still holding Ozempic  due to low oral intake / waiting to see if he might need additional surgery.  He will discuss restarting Ozempic  when he sees Dr Trixie in early November.  Home blood glucose readings have been higher recently with more postprandial excursions.  Taking Xarelto  for afib. Previously was taking 20mg  daily but was recently lowered to 15mg  daily. Suspect dose adjusted during hospitalization for renal and weight changes.  Wt = 200 lbs / Ht = 5'11 / Scr = 1.51 - hospital values Wt = 213lbs / Ht = 5'11 / last Scr = 1.4 - most recent values    Reviewed recent refills.  Atorvastatin shows low adherence so far in 2025 at 79%. He has had 2 hospitalizations this year but neither were long admissions. Patient last filled atorvastatin 04/10/2024 for 90 days. Also filled for 90 days 08/19/2023 and 01/03/2024.   Objective:  BP Readings from Last 3 Encounters:  05/09/24 95/62  04/24/24 90/60  04/16/24 (!) 81/48     Lab Results  Component Value Date   HGBA1C 7.9 (A) 03/28/2024    Lab Results  Component Value Date   CREATININE 1.40 (H) 03/20/2024   BUN 18 03/20/2024   NA 136 03/20/2024   K 4.0 03/20/2024   CL 103 03/20/2024   CO2 22 03/20/2024  Lab Results  Component Value Date   CHOL 130 04/24/2024   HDL 45 04/24/2024   LDLCALC 66 04/24/2024   LDLDIRECT 103.0 06/22/2020   TRIG 99 04/24/2024   CHOLHDL 2.9 04/24/2024    Medications Reviewed Today     Reviewed by Carla Milling, RPH-CPP (Pharmacist) on 05/13/24 at 1216  Med List Status: <None>   Medication Order Taking? Sig Documenting Provider Last Dose Status Informant  acetaminophen  (TYLENOL ) 650 MG CR tablet [id number]  Take 650 mg by mouth every 8 (eight) hours as  needed for pain. [provider]  Active   COMBIGAN  0.2-0.5 % ophthalmic solution [id number]  Place 1 drop into both eyes every 12 (twelve) hours. [provider]  Active Self, Pharmacy Records           Med Note ALAIN, [place] A   Fri Jun 02, 2016  1:39 PM)    ENTRESTO  97-103 MG [id number]  TAKE 1 TABLET TWO TIMES A DAY Croitoru, Mihai, MD  Active Self, Pharmacy Records  eplerenone  (INSPRA ) 25 MG tablet [id number]  TAKE 1 TABLET DAILY Croitoru, Mihai, MD  Active   furosemide  (LASIX ) 40 MG tablet [id number]  Take 1 tablet (40 mg total) by mouth as needed. For weight gain and/or edema Croitoru, Mihai, MD  Active   gabapentin  (NEURONTIN ) 300 MG capsule [id number] Yes TAKE 2 CAPSULES TWICE A DAY McGowen, Aleene DEL, MD  Active Self, Pharmacy Records  insulin  aspart (NOVOLOG  FLEXPEN) 100 UNIT/ML FlexPen [id number]  Inject 10 units before each meal Trixie File, MD  Active Self, Pharmacy Records  insulin  glargine (LANTUS  SOLOSTAR) 100 UNIT/ML Solostar Pen [id number]  Inject 30-34 Units into the skin daily. Trixie File, MD  Active Self, Pharmacy Records  JARDIANCE  10 MG TABS tablet [id number]  TAKE 1 TABLET DAILY Trixie File, MD  Active Self, Pharmacy Records  latanoprost  (XALATAN ) 0.005 % ophthalmic solution [id number]  Place 1 drop into both eyes at bedtime. [provider]  Active Self, Pharmacy Records  lidocaine  (LIDODERM ) 5 % [id number]  PLACE 1 PATCH ON THE SKIN DAILY, REMOVE AND DISCARD PATCH WITHIN 12 HOURS OR AS DIRECTED BY DOCTOR (OFFICE VISIT NEEDED FOR FURTHER REFILLS) McGowen, Philip H, MD  Active   meclizine  (ANTIVERT ) 25 MG tablet [id number]  TAKE 1 TABLET THREE TIMES A DAY AS NEEDED FOR DIZZINESS McGowen, Aleene DEL, MD  Active   mirabegron  ER (MYRBETRIQ ) 25 MG TB24 tablet [id number]  1 tab po qd McGowen, Philip H, MD  Active   omeprazole  (PRILOSEC) 40 MG capsule [id number]  Take 1 capsule (40 mg total) by mouth daily. McGowen, Aleene DEL, MD  Active   oxybutynin   (DITROPAN -XL) 10 MG 24 hr tablet [id number]  Take 1 tablet (10 mg total) by mouth at bedtime. McGowen, Philip H, MD  Active   Oxycodone  HCl 10 MG TABS [id number]  1 tab po bid prn pain McGowen, Aleene DEL, MD  Active   polyethylene glycol (MIRALAX ) 17 g packet [id number]  Take 17 g by mouth daily.  Patient taking differently: Take 17 g by mouth daily as needed.   Mannie Fairy DASEN, DO  Active Self, Pharmacy Records  Rivaroxaban  (XARELTO ) 15 MG TABS tablet [id number]  Take 1 tablet (15 mg total) by mouth daily with supper. McGowen, Philip H, MD  Active   rOPINIRole  (REQUIP ) 1 MG tablet [id number]  Take 1 tablet (1 mg total) by mouth at bedtime. McGowen, Philip H, MD  Active Self, Pharmacy Records  rosuvastatin  (CRESTOR ) 20 MG tablet [id number]  Take  1 tablet (20 mg total) by mouth at bedtime. McGowen, Philip H, MD  Active   Semaglutide , 1 MG/DOSE, 4 MG/3ML SOPN [id number]  Inject 1 mg as directed once a week.  Patient not taking: Reported on 05/13/2024   Trixie File, MD  Active Self, Pharmacy Records           Med Note TERENCE CARE D   Fri May 09, 2024  1:21 PM)    sertraline  (ZOLOFT ) 100 MG tablet [id number] Yes TAKE 1 TABLET DAILY McGowen, Aleene DEL, MD  Active Self, Pharmacy Records              Assessment/Plan:   Medication Management / Polypharmacy:  - Reviewed medication list and updated - Continue to monitor serum Creatinine. If Creatinine clearance > 50 mL/min with next serum creatinine check, then consider if adjusting Xarelto  dose is appropriate.   - Reminded patient to take Novolog  10 to 15 minutes prior to meals for best result in preventing post prandial blood glucose increases.  - Called [facility] to check on order from Dr Georgina for MRI. Per receptionist, there is not an order on their end. Needs to be faxed to [phone]. Outreached Dr Moore's office to request order be sent to fax provided.  - Following adherence for 2025. Next due atorvastatin  06/2024. Reminded patient that ropinirole  will be due at end of October.   Follow Up Plan: 4 to 6 weeks.   Madelin Ray, PharmD Clinical Pharmacist [facility]  Population Health [phone]

## 2024-05-13 NOTE — Telephone Encounter (Signed)
 Left detailed message with approved verbal orders on secure line. Office contact information was provided as well.

## 2024-05-13 NOTE — Telephone Encounter (Signed)
 Copied from CRM #[id number]. Topic: Clinical - Home Health Verbal Orders >> May 13, 2024 10:00 AM Burnard DEL wrote: Caller/Agency: Signe Rushing Number: [phone] secured line Service Requested: Physical Therapy Frequency: 2wk 4 Any new concerns about the patient? No   Okay for orders?

## 2024-05-15 NOTE — Telephone Encounter (Signed)
 Signed and put in box to go up front. Signed:  Gerlene Hockey, MD           05/15/2024

## 2024-05-15 NOTE — Telephone Encounter (Signed)
 Rec'd fax from [facility] regarding   verbal order #[id number]  McGowen inbox front office

## 2024-05-15 NOTE — Telephone Encounter (Signed)
 Home health PT orders # [id number] received 05/15/24 for Mason [name] health initiation orders: Yes.  Home health re-certification orders: No. Patient last seen by ordering physician for this condition: 05/09/24. Must be less than 90 days for re-certification and less than 30 days prior for initiation. Visit must have been for the condition the orders are being placed.  Patient meets criteria for Physician to sign orders: Yes.        Current med list has been attached: No        Orders placed on physicians desk for signature: 05/15/24 (date) If patient does not meet criteria for orders to be signed: pt was called to schedule appt. Appt is scheduled for 08/11/24.    Placed on PCP desk to review and sign, if appropriate.      Russie Gulledge D Sherece Gambrill

## 2024-05-19 ENCOUNTER — Telehealth: Payer: Self-pay | Admitting: Pharmacist

## 2024-05-19 NOTE — Telephone Encounter (Signed)
 Patient called to ask about MRI that was ordered by Dr Jeraline office. I spoke with someone last week and they reported it was bing worked on. It looks like information was faxed to [facility] 05/13/2024. Patient states he has not been contacted yet.   Provided phone number and information for [facility] so he could call.   [facility] [address] [place], [place] [address] [phone]

## 2024-05-20 DIAGNOSIS — Z961 Presence of intraocular lens: Secondary | ICD-10-CM | POA: Diagnosis not present

## 2024-05-20 DIAGNOSIS — H401112 Primary open-angle glaucoma, right eye, moderate stage: Secondary | ICD-10-CM | POA: Diagnosis not present

## 2024-05-20 DIAGNOSIS — I5022 Chronic systolic (congestive) heart failure: Secondary | ICD-10-CM | POA: Diagnosis not present

## 2024-05-20 DIAGNOSIS — Z981 Arthrodesis status: Secondary | ICD-10-CM | POA: Diagnosis not present

## 2024-05-20 DIAGNOSIS — H401123 Primary open-angle glaucoma, left eye, severe stage: Secondary | ICD-10-CM | POA: Diagnosis not present

## 2024-05-20 DIAGNOSIS — I130 Hypertensive heart and chronic kidney disease with heart failure and stage 1 through stage 4 chronic kidney disease, or unspecified chronic kidney disease: Secondary | ICD-10-CM | POA: Diagnosis not present

## 2024-05-20 DIAGNOSIS — Z4789 Encounter for other orthopedic aftercare: Secondary | ICD-10-CM | POA: Diagnosis not present

## 2024-05-20 DIAGNOSIS — M1712 Unilateral primary osteoarthritis, left knee: Secondary | ICD-10-CM | POA: Diagnosis not present

## 2024-05-20 DIAGNOSIS — I4821 Permanent atrial fibrillation: Secondary | ICD-10-CM | POA: Diagnosis not present

## 2024-05-21 DIAGNOSIS — M48061 Spinal stenosis, lumbar region without neurogenic claudication: Secondary | ICD-10-CM | POA: Diagnosis not present

## 2024-05-21 DIAGNOSIS — I4821 Permanent atrial fibrillation: Secondary | ICD-10-CM | POA: Diagnosis not present

## 2024-05-21 DIAGNOSIS — I442 Atrioventricular block, complete: Secondary | ICD-10-CM | POA: Diagnosis not present

## 2024-05-21 DIAGNOSIS — I5022 Chronic systolic (congestive) heart failure: Secondary | ICD-10-CM | POA: Diagnosis not present

## 2024-05-21 DIAGNOSIS — Z950 Presence of cardiac pacemaker: Secondary | ICD-10-CM | POA: Diagnosis not present

## 2024-05-21 DIAGNOSIS — M9983 Other biomechanical lesions of lumbar region: Secondary | ICD-10-CM | POA: Diagnosis not present

## 2024-05-21 DIAGNOSIS — N183 Chronic kidney disease, stage 3 unspecified: Secondary | ICD-10-CM | POA: Diagnosis not present

## 2024-05-21 DIAGNOSIS — Z981 Arthrodesis status: Secondary | ICD-10-CM | POA: Diagnosis not present

## 2024-05-21 DIAGNOSIS — Z9181 History of falling: Secondary | ICD-10-CM | POA: Diagnosis not present

## 2024-05-21 DIAGNOSIS — I700 Atherosclerosis of aorta: Secondary | ICD-10-CM | POA: Diagnosis not present

## 2024-05-21 DIAGNOSIS — M11262 Other chondrocalcinosis, left knee: Secondary | ICD-10-CM | POA: Diagnosis not present

## 2024-05-21 DIAGNOSIS — I130 Hypertensive heart and chronic kidney disease with heart failure and stage 1 through stage 4 chronic kidney disease, or unspecified chronic kidney disease: Secondary | ICD-10-CM | POA: Diagnosis not present

## 2024-05-21 DIAGNOSIS — E1122 Type 2 diabetes mellitus with diabetic chronic kidney disease: Secondary | ICD-10-CM | POA: Diagnosis not present

## 2024-05-21 DIAGNOSIS — Z794 Long term (current) use of insulin: Secondary | ICD-10-CM | POA: Diagnosis not present

## 2024-05-21 DIAGNOSIS — Z7985 Long-term (current) use of injectable non-insulin antidiabetic drugs: Secondary | ICD-10-CM | POA: Diagnosis not present

## 2024-05-21 DIAGNOSIS — Z7901 Long term (current) use of anticoagulants: Secondary | ICD-10-CM | POA: Diagnosis not present

## 2024-05-21 DIAGNOSIS — E785 Hyperlipidemia, unspecified: Secondary | ICD-10-CM | POA: Diagnosis not present

## 2024-05-21 DIAGNOSIS — Z7984 Long term (current) use of oral hypoglycemic drugs: Secondary | ICD-10-CM | POA: Diagnosis not present

## 2024-05-21 DIAGNOSIS — M1712 Unilateral primary osteoarthritis, left knee: Secondary | ICD-10-CM | POA: Diagnosis not present

## 2024-05-21 DIAGNOSIS — Z4789 Encounter for other orthopedic aftercare: Secondary | ICD-10-CM | POA: Diagnosis not present

## 2024-05-22 ENCOUNTER — Telehealth: Payer: Self-pay | Admitting: Pharmacist

## 2024-05-22 NOTE — Telephone Encounter (Signed)
 Opened in error

## 2024-05-22 NOTE — Telephone Encounter (Signed)
 Patient left a message on VM of clinical pharmacist. He reports he tried to call [facility] to schedule MRI that was recommended by Dr Georgina but patient states [facility] states they do not have order yet.  I called [facility] because it looks like order was faxed 05/13/24 by Dr Jeraline office.  Per representative at [facility] states that fax number that is list in note below is not correct fax number.  Correct fax number is [phone].   Tried to call [facility] scheduling department but had to LM with above information  Notified Mr. Gigante - recommended he give Dr Jeraline office 24 hour to resent order and check back Monday 11/3 with [facility].  Madelin Ray, PharmD Clinical Pharmacist [name]

## 2024-05-23 DIAGNOSIS — I4821 Permanent atrial fibrillation: Secondary | ICD-10-CM | POA: Diagnosis not present

## 2024-05-23 DIAGNOSIS — Z4789 Encounter for other orthopedic aftercare: Secondary | ICD-10-CM | POA: Diagnosis not present

## 2024-05-23 DIAGNOSIS — I5022 Chronic systolic (congestive) heart failure: Secondary | ICD-10-CM | POA: Diagnosis not present

## 2024-05-23 DIAGNOSIS — Z981 Arthrodesis status: Secondary | ICD-10-CM | POA: Diagnosis not present

## 2024-05-23 DIAGNOSIS — M1712 Unilateral primary osteoarthritis, left knee: Secondary | ICD-10-CM | POA: Diagnosis not present

## 2024-05-23 DIAGNOSIS — I130 Hypertensive heart and chronic kidney disease with heart failure and stage 1 through stage 4 chronic kidney disease, or unspecified chronic kidney disease: Secondary | ICD-10-CM | POA: Diagnosis not present

## 2024-05-26 ENCOUNTER — Encounter

## 2024-05-26 ENCOUNTER — Other Ambulatory Visit: Payer: Self-pay | Admitting: Cardiovascular Disease

## 2024-05-26 ENCOUNTER — Encounter: Payer: Self-pay | Admitting: Radiology

## 2024-05-26 ENCOUNTER — Telehealth: Payer: Self-pay

## 2024-05-26 DIAGNOSIS — Z981 Arthrodesis status: Secondary | ICD-10-CM | POA: Diagnosis not present

## 2024-05-26 DIAGNOSIS — I442 Atrioventricular block, complete: Secondary | ICD-10-CM | POA: Diagnosis not present

## 2024-05-26 DIAGNOSIS — Z950 Presence of cardiac pacemaker: Secondary | ICD-10-CM | POA: Diagnosis not present

## 2024-05-26 DIAGNOSIS — I5022 Chronic systolic (congestive) heart failure: Secondary | ICD-10-CM | POA: Diagnosis not present

## 2024-05-26 DIAGNOSIS — M11262 Other chondrocalcinosis, left knee: Secondary | ICD-10-CM | POA: Diagnosis not present

## 2024-05-26 DIAGNOSIS — M1712 Unilateral primary osteoarthritis, left knee: Secondary | ICD-10-CM | POA: Diagnosis not present

## 2024-05-26 DIAGNOSIS — E1122 Type 2 diabetes mellitus with diabetic chronic kidney disease: Secondary | ICD-10-CM | POA: Diagnosis not present

## 2024-05-26 DIAGNOSIS — E785 Hyperlipidemia, unspecified: Secondary | ICD-10-CM | POA: Diagnosis not present

## 2024-05-26 DIAGNOSIS — N183 Chronic kidney disease, stage 3 unspecified: Secondary | ICD-10-CM | POA: Diagnosis not present

## 2024-05-26 DIAGNOSIS — I130 Hypertensive heart and chronic kidney disease with heart failure and stage 1 through stage 4 chronic kidney disease, or unspecified chronic kidney disease: Secondary | ICD-10-CM | POA: Diagnosis not present

## 2024-05-26 DIAGNOSIS — I4821 Permanent atrial fibrillation: Secondary | ICD-10-CM | POA: Diagnosis not present

## 2024-05-26 DIAGNOSIS — Z4789 Encounter for other orthopedic aftercare: Secondary | ICD-10-CM | POA: Diagnosis not present

## 2024-05-26 LAB — CUP PACEART REMOTE DEVICE CHECK
Battery Remaining Longevity: 4 mo
Battery Voltage: 2.8 V
Brady Statistic AP VP Percent: 0 %
Brady Statistic AP VS Percent: 0 %
Brady Statistic AS VP Percent: 0 %
Brady Statistic AS VS Percent: 0 %
Brady Statistic RA Percent Paced: 0 %
Brady Statistic RV Percent Paced: 99.9 %
Date Time Interrogation Session: 20251103052826
HighPow Impedance: 77 Ohm
Implantable Lead Connection Status: 753985
Implantable Lead Connection Status: 753985
Implantable Lead Connection Status: 753985
Implantable Lead Implant Date: 20130715
Implantable Lead Implant Date: 20180614
Implantable Lead Implant Date: 20180614
Implantable Lead Location: 753858
Implantable Lead Location: 753859
Implantable Lead Location: 753860
Implantable Lead Model: 4598
Implantable Pulse Generator Implant Date: 20180614
Lead Channel Impedance Value: 218.087
Lead Channel Impedance Value: 237.686
Lead Channel Impedance Value: 237.686
Lead Channel Impedance Value: 249.509
Lead Channel Impedance Value: 275.5 Ohm
Lead Channel Impedance Value: 342 Ohm
Lead Channel Impedance Value: 418 Ohm
Lead Channel Impedance Value: 418 Ohm
Lead Channel Impedance Value: 456 Ohm
Lead Channel Impedance Value: 532 Ohm
Lead Channel Impedance Value: 551 Ohm
Lead Channel Impedance Value: 551 Ohm
Lead Channel Impedance Value: 722 Ohm
Lead Channel Impedance Value: 760 Ohm
Lead Channel Impedance Value: 836 Ohm
Lead Channel Impedance Value: 874 Ohm
Lead Channel Impedance Value: 893 Ohm
Lead Channel Impedance Value: 988 Ohm
Lead Channel Pacing Threshold Amplitude: 0.5 V
Lead Channel Pacing Threshold Amplitude: 0.625 V
Lead Channel Pacing Threshold Pulse Width: 0.4 ms
Lead Channel Pacing Threshold Pulse Width: 1 ms
Lead Channel Sensing Intrinsic Amplitude: 0.625 mV
Lead Channel Sensing Intrinsic Amplitude: 13.75 mV
Lead Channel Sensing Intrinsic Amplitude: 13.75 mV
Lead Channel Setting Pacing Amplitude: 1 V
Lead Channel Setting Pacing Amplitude: 2 V
Lead Channel Setting Pacing Pulse Width: 0.4 ms
Lead Channel Setting Pacing Pulse Width: 1 ms
Lead Channel Setting Sensing Sensitivity: 0.3 mV
Zone Setting Status: 755011

## 2024-05-26 NOTE — Telephone Encounter (Signed)
 Received fax from [facility] Certification & Plan of Care 05/21/24 to 07/19/24  Order # [id number]   McGowen inbox front office

## 2024-05-26 NOTE — Telephone Encounter (Signed)
 Forms faxed back.

## 2024-05-26 NOTE — Telephone Encounter (Signed)
 Signed and put in box to go up front. Signed:  Gerlene Hockey, MD           05/26/2024

## 2024-05-26 NOTE — Telephone Encounter (Signed)
 Home health orders received 05/26/24 for Mason [name] health initiation orders: Yes.  Home health re-certification orders: No. Patient last seen by ordering physician for this condition: 05/09/24. Must be less than 90 days for re-certification and less than 30 days prior for initiation. Visit must have been for the condition the orders are being placed.  Patient meets criteria for Physician to sign orders: Yes.        Current med list has been attached: Yes        Orders placed on physicians desk for signature: 05/26/24 (date) If patient does not meet criteria for orders to be signed: pt was called to schedule appt. Appt is scheduled for 08/11/24.   Placed on PCP desk to review and sign, if appropriate.   Sheriff Rodenberg D Azim Gillingham

## 2024-05-27 ENCOUNTER — Telehealth: Payer: Self-pay | Admitting: Family Medicine

## 2024-05-27 DIAGNOSIS — I5022 Chronic systolic (congestive) heart failure: Secondary | ICD-10-CM | POA: Diagnosis not present

## 2024-05-27 DIAGNOSIS — I130 Hypertensive heart and chronic kidney disease with heart failure and stage 1 through stage 4 chronic kidney disease, or unspecified chronic kidney disease: Secondary | ICD-10-CM | POA: Diagnosis not present

## 2024-05-27 DIAGNOSIS — Z981 Arthrodesis status: Secondary | ICD-10-CM | POA: Diagnosis not present

## 2024-05-27 DIAGNOSIS — I4821 Permanent atrial fibrillation: Secondary | ICD-10-CM | POA: Diagnosis not present

## 2024-05-27 DIAGNOSIS — Z4789 Encounter for other orthopedic aftercare: Secondary | ICD-10-CM | POA: Diagnosis not present

## 2024-05-27 DIAGNOSIS — M1712 Unilateral primary osteoarthritis, left knee: Secondary | ICD-10-CM | POA: Diagnosis not present

## 2024-05-27 NOTE — Telephone Encounter (Signed)
 I called pt and advised him the order was sent to wrong fax and this has been corrected and sent over to the correct fax of [phone]

## 2024-05-27 NOTE — Telephone Encounter (Signed)
 Pt called requesting someone reach out about his MRI that was to be sent to [facility]. Pt states it's been a month an no one from MRI facility has called him yet. Please contact pt about this matter at [phone].

## 2024-05-28 ENCOUNTER — Ambulatory Visit: Admitting: Internal Medicine

## 2024-05-29 ENCOUNTER — Ambulatory Visit (INDEPENDENT_AMBULATORY_CARE_PROVIDER_SITE_OTHER): Admitting: Internal Medicine

## 2024-05-29 ENCOUNTER — Encounter: Payer: Self-pay | Admitting: Internal Medicine

## 2024-05-29 ENCOUNTER — Ambulatory Visit: Payer: Self-pay | Admitting: Cardiovascular Disease

## 2024-05-29 VITALS — BP 120/60 | Ht 71.0 in | Wt 214.4 lb

## 2024-05-29 DIAGNOSIS — E119 Type 2 diabetes mellitus without complications: Secondary | ICD-10-CM | POA: Diagnosis not present

## 2024-05-29 DIAGNOSIS — E785 Hyperlipidemia, unspecified: Secondary | ICD-10-CM | POA: Diagnosis not present

## 2024-05-29 DIAGNOSIS — Z794 Long term (current) use of insulin: Secondary | ICD-10-CM

## 2024-05-29 DIAGNOSIS — E1165 Type 2 diabetes mellitus with hyperglycemia: Secondary | ICD-10-CM | POA: Diagnosis not present

## 2024-05-29 DIAGNOSIS — E1159 Type 2 diabetes mellitus with other circulatory complications: Secondary | ICD-10-CM

## 2024-05-29 LAB — POCT GLYCOSYLATED HEMOGLOBIN (HGB A1C): Hemoglobin A1C: 6.8 % — AB (ref 4.0–5.6)

## 2024-05-29 MED ORDER — LANTUS SOLOSTAR 100 UNIT/ML ~~LOC~~ SOPN: 22.0000 [IU] | PEN_INJECTOR | Freq: Every day | SUBCUTANEOUS | Status: AC

## 2024-05-29 MED ORDER — NOVOLOG FLEXPEN 100 UNIT/ML ~~LOC~~ SOPN: PEN_INJECTOR | SUBCUTANEOUS | Status: AC

## 2024-05-29 NOTE — Patient Instructions (Addendum)
 Please continue: - Jardiance  10 mg daily before b'fast - Ozempic  1 mg weekly  Please reduce: - Lantus  22 units at bedtime - NovoLog  15 min before meals: 8-10 units before brunch 8-10 units before dinner If you have to take the suppertime insulin  after the meal, take only up to 5 units.  Please return in 3-4 months.

## 2024-05-29 NOTE — Addendum Note (Signed)
 Addended by: CLEOTILDE ROLIN RAMAN on: 05/29/2024 10:52 AM   Modules accepted: Orders

## 2024-05-29 NOTE — Progress Notes (Signed)
 Patient ID: Mason [name], male   DOB: 04-Apr-1941, 83 y.o.   MRN: [id number]   HPI: Mason [name] is a 83 y.o.-year-old male, initially referred by his PCP, Dr. Candise, returning for follow-up for DM2, dx in ~2011, insulin -dependent since 2019, uncontrolled, with long-term complications (CAD with ?h/o AMI, nonischemic CMP, CHF, Afib, cerebrovascular disease with h/o TIA, CKD stage III, peripheral neuropathy, ED).  Last visit 2 months ago.    Interim history: No increased urination, nausea, chest pain.   He has back pain, previously had steroid injections. Before last visit, he had lumbar fusion on 02/25/2024.  He stopped Ozempic  before surgery.  After surgery, he developed significant pain and also drug-induced constipation.  He continued off Ozempic .  However, he tells me that he just took another dose 2 weeks ago.  He skipped it this week.  Reviewed HbA1c levels:  Lab Results  Component Value Date   HGBA1C 7.9 (A) 03/28/2024   HGBA1C 6.8 (A) 01/29/2024   HGBA1C 7.1 (A) 10/11/2023   HGBA1C 6.6 (A) 06/11/2023   HGBA1C 7.1 (A) 02/08/2023   HGBA1C 7.1 (A) 09/29/2022   HGBA1C 6.6 (A) 05/31/2022   HGBA1C 7.3 (A) 01/26/2022   HGBA1C 7.2 (A) 09/08/2021   HGBA1C 6.7 (A) 12/09/2020   HGBA1C 7.6 (A) 09/09/2020   HGBA1C 9.7 (H) 06/22/2020   HGBA1C 9.0 (H) 03/23/2020   HGBA1C 9.2 (H) 03/16/2020   HGBA1C 9.9 (H) 11/20/2019   HGBA1C 9.7 (H) 05/27/2019   HGBA1C 9.0 (H) 02/26/2019   HGBA1C 10.1 (H) 11/29/2018   HGBA1C 9.0 (A) 08/29/2018   HGBA1C 9.0 08/29/2018   HGBA1C 9.0 (A) 08/29/2018   HGBA1C 9.0 (A) 08/29/2018   HGBA1C 7.8 (A) 05/14/2018   HGBA1C 8.1 (H) 02/06/2018   HGBA1C 8.0 (H) 10/26/2017   HGBA1C 8.2 (H) 07/27/2017   HGBA1C 7.0 03/01/2017   HGBA1C 7.1 11/16/2016   HGBA1C 7.8 (H) 07/03/2016   HGBA1C 6.8 03/14/2016   He was previously on: - Jardiance  10 mg before breakfast - Trulicity  1.5 >> 3  mg weekly - NovoLog  pens :  14-18 >> 18-20 >> 15-16 >> 15 >> 10 >> but taking 12 units  before brunch 8-10 units before dinner >> 0 units >> 7-8 >> but taking 10 units before a larger dinner  If you have a snack at night, you may need ~5 units before the snack - Lantus  vial 44 >> 40 >> 36-40 >> 42 >> 45 >> 40 >> but taking 42 units at bedtime She was on metformin  in the past.  Now on: - Jardiance  10 mg before breakfast - Ozempic  1 mg weekly >> restarted - Lantus  36 >> 42 >> 38 >> 34 >> 32 >> 30 >> 28 >> 26 units at bedtime - NovoLog  15 min before meals: 8-10 >> 12 units before brunch 6-8 >> 10 units before dinner If you have to take the suppertime insulin  after the meal, take only up to 5 units. He was previously on Trulicity .  Pt checks his sugars more than 4 times a day with his freestyle libre CGM:  Previously:  Prev.:  Lowest sugar was: 45 >> 48; he has hypoglycemia awareness at 70.  Highest sugar was 400 >> ...  250 >> 300s  Glucometer: Freestyle  Pt's meals are: - Breakfast: cereals, eggs, grits, toast - Lunch: may skip or sandwich - Dinner: meat + veggies + starch - Snacks: apple/cheese >> bananasplits at night Patient saw nutrition a long time ago.  -+ Stage  III CKD, last BUN/creatinine:  Lab Results  Component Value Date   BUN 18 03/20/2024   BUN 22 03/17/2024   CREATININE 1.40 (H) 03/20/2024   CREATININE 1.51 (H) 03/17/2024   Lab Results  Component Value Date   MICRALBCREAT 4 03/28/2024  On Entresto .  -+ HL; last set of lipids: Lab Results  Component Value Date   CHOL 130 04/24/2024   HDL 45 04/24/2024   LDLCALC 66 04/24/2024   LDLDIRECT 103.0 06/22/2020   TRIG 99 04/24/2024   CHOLHDL 2.9 04/24/2024  Previously on Zocor  20, but changed to Crestor  20 mg daily.  - last eye exam was 2025:+ DR. He also has glaucoma. Dr. Octavia and Dr. Elner.  - + numbness and tingling in his feet.  On Neurontin  300 mg twice a day.   Seeing podiatry - Dr. Joshua at the [facility] clinic.  Latest foot exam 11/22/2023.  No known FH of DM.  He also has a history of  HTN, nephrolithiasis, GERD, chronic fungal balanitis and phimosis - s/p circumcision 02/2020, skin cancer.  Drinks beer 2-3x a day, but not quite every day.  ROS: + see HPI  I reviewed pt's medications, allergies, PMH, social hx, family hx, and changes were documented in the history of present illness. Otherwise, unchanged from my initial visit note.  Past Medical History:  Diagnosis Date   AICD (automatic cardioverter/defibrillator) present 2018   MDT CRT-D.  Fatigue-->completely pacer dependent.  Pacer settings adjusted 12/2017 to allow more chronotrophc variance with ADL's//exertion.   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   02/2016 EF 40%, diffuse hypokinesis, grade 2 DD.  Myoc perf imaging showed EF 32% 04/2016.  Pt upgraded to CRT-D 01/04/17.   Chronic renal insufficiency, stage III (moderate) 2015   CrCl about 60 ml/min   Clotting disorder    Complete heart block (HCC)    Has dual chamber pacer.   COVID-19 virus infection 01/05/2021   paxlovid   Depression    DOE (dyspnea on exertion)    NYHA class II/III CHF   Dyspnea 2021   with exertion, bending over   Dysrhythmia    A. Fib   Episodic low back pain 01/22/2013   w/intermittent radiculitis (12/2014 his neurologist referred him to pain mgmt for epidural steroid injection)   Erectile dysfunction 2019   due to zoloft --urol rx'd viagra   GERD (gastroesophageal reflux disease)    Glaucoma 2017   H/O tilt table evaluation 11/02/2005   negative   Helicobacter pylori gastritis 01/2016   History of adenomatous polyp of colon 10/12/2011   Dr. Rollin (3 right side of colon- tubular adenomas removed)   History  of cardiovascular stress test 05/28/2012   no ischemia, EF 37%, imaging results are unchanged and within normal variance   History of chronic prostatitis    History of kidney stones    History of vertigo    + Hx of posterior HA's.  Neuro (Dr. Maurice) eval 2011.  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   Hyperlipidemia    Hypertension    Lumbar spondylosis    lumbosacral radiculopathy at L4 by  EMG testing, right foot drop (neurologist is Dr. Birder with [facility] in [place])--neurologist referred him to neurosurgery   Migraine    used to have them all the time; none for years (01/04/2017)   Myocardial infarction [facility]) ?1970s   not entirely certain of this   Nephrolithiasis 07/2012   Left UVJ 2 mm stone with dilation of renal collecting system and slight hydroureter on right   Neuropathy    NICM (nonischemic cardiomyopathy) (HCC)    a. 02/2018 Cath: LM nl, LAD min irregs, LCX no, RCA 20d. [id number]. Fick CO/CI 4.4/2.0.   Osteoarthritis, multiple sites    Shoulders, back, knees   Osteoporosis    Pacemaker 02/05/2012   dual chamber, complete heart block, meddtronic revo, lasted checked 12/2015.  Since no CAD on cath 05/2016, cards recommends upgrade to CRT-D.   Peripheral vascular disease    Peripheral Artery Disease   Permanent atrial fibrillation (HCC)    DCCV 07/09/13-converted, lasted two days, then back into afib--needs lifetime anticoagulation (Xarelto  as of 09/2014)   Prostate cancer screening 09/2017   done by urol annually (normal prostate exam documented + PSA 0.84 as of 10/01/17 urol f/u.  10/2018 urol f/u PSA 0.6, no prostate nodule.   Rectus diastasis    Right ankle sprain 08/2017   w/distal fibula avulsion fx noted on u/s but not plain film-(Dr. Hudnall).   Skin cancer of arm, left    burned it off (01/04/2017)   TIA (transient ischemic attack)    L face and L arm weakness. Peri  procedural->a. 03/22/2018 following cath. CT head neg. No MRI b/c has pacer. Likely due to embolus to distal branch of RMCA   Type II diabetes mellitus (HCC)    Past Surgical History:  Procedure Laterality Date   ABI's Bilateral 05/21/2018   normal   ANTERIOR LATERAL LUMBAR FUSION WITH PERCUTANEOUS SCREW 2 LEVEL Left 02/25/2024   Procedure: LEFT ANTERIOR LATERAL LUMBAR FUSION WITH PERCUTANEOUS PEDICLE SCREW LUMBAR TWO-LUMBAR THREE, LUMBAR THREE-LUMBAR FOUR, POSTERIOR LATERAL AND INTERBODY FUSION;  Surgeon: Louis Shove, MD;  Location: [facility] OR;  Service: Neurosurgery;  Laterality: Left;  XLIF - left - L2-L3 - L3-L4 with perc pedicle screws - Posterior Lateral and Interbody fusion   BACK SURGERY     BIV ICD INSERTION CRT-D N/A 01/04/2017   Procedure: BiV ICD ;  Surgeon: Inocencio Soyla Lunger, MD;  Location[facility] INVASIVE CV LAB;  Service: Cardiovascular;  Laterality: N/A;   CARDIAC CATHETERIZATION N/A 06/14/2016   Minimal nonobstructive dz, EF 25-35%.  Procedure: Left Heart Cath and Coronary Angiography;  Surgeon: Peter M Jordan, MD;  Location: [facility] INVASIVE CV LAB;  Service: Cardiovascular;  Laterality: N/A;   CARDIOVASCULAR STRESS TEST  2012   2012 nuclear perfusion study: low risk scan; 04/2016 normal myocardial perfusion imaging, EF 32%.   CARDIOVERSION  07/09/2012   Procedure: CARDIOVERSION;  Surgeon: Jerel Balding, MD;  Location: [facility] ENDOSCOPY;  Service: Cardiovascular;  Laterality: N/A;   CATARACT EXTRACTION W/ INTRAOCULAR LENS IMPLANT & ANTERIOR VITRECTOMY, BILATERAL Bilateral    CIRCUMCISION N/A 03/23/2020   Procedure: CIRCUMCISION ADULT;  Surgeon: Rosalind Zachary NOVAK, MD;  Location: [facility] ORS;  Service: Urology;  Laterality: N/A;   COLONOSCOPY W/ POLYPECTOMY  approx 2006; repeated 09/2011   Polyps on 2013 EGD as well, repeat 12/2014   COLONOSCOPY WITH PROPOFOL  N/A 07/08/2021   adenoma x 1. Procedure: COLONOSCOPY WITH PROPOFOL ;  Surgeon: Rollin Dover, MD;  Location: [facility] ENDOSCOPY;  Service: Endoscopy;   Laterality: N/A;  ESOPHAGEAL DILATION  10/19/2023   Procedure: DILATION, ESOPHAGUS;  Surgeon: Rollin Dover, MD;  Location: [facility] ENDOSCOPY;  Service: Gastroenterology;;   ESOPHAGOGASTRODUODENOSCOPY  10/18/2006   Done due to chronic GERD: Normal, bx showed no barrett's esophagus (Dr. Rollin)   EYE SURGERY  01/08/2023   FLEXOR TENDON REPAIR Left 10/02/2016   Procedure: LEFT RING FINGER WOUND EXPORATION AND FLEXOR TENDON REPAIR AND NERVE REPAIR;  Surgeon: Alm Hummer, MD;  Location: [facility] OR;  Service: Orthopedics;  Laterality: Left;   INSERT / REPLACE / REMOVE PACEMAKER  02/05/2012   dual chamber, sinus node dysfunction, sinus arrest, PAF, Medtronic Revo serial#-[id number] H: last checked 05/2015   JOINT REPLACEMENT  2017   LUMBAR LAMINECTOMY Left 1976   L4-5   PACEMAKER REMOVAL  01/04/2017   PERMANENT PACEMAKER INSERTION N/A 02/05/2012   Procedure: PERMANENT PACEMAKER INSERTION;  Surgeon: Jerel Balding, MD; Generator Medtronic [place] model [place] serial number [id number] H Laterality: N/A;   POLYPECTOMY  07/08/2021   Procedure: POLYPECTOMY;  Surgeon: Rollin Dover, MD;  Location: [facility] ENDOSCOPY;  Service: Endoscopy;;   RETINAL DETACHMENT SURGERY Left ~ 1999   REVERSE SHOULDER ARTHROPLASTY Left 2018   Left shoulder reverse [facility] in [place]).   RIGHT/LEFT HEART CATH AND CORONARY ANGIOGRAPHY N/A 03/22/2018   EF 30-35%, no CAD.  Procedure: RIGHT/LEFT HEART CATH AND CORONARY ANGIOGRAPHY;  Surgeon: Cherrie Toribio SAUNDERS, MD;  Location: [facility] INVASIVE CV LAB;  Service: Cardiovascular;  Laterality: N/A;   SAVORY DILATION N/A 10/19/2023   Procedure: EGD, WITH DILATION USING SAVARY-GILLIARD DILATOR OVER GUIDEWIRE;  Surgeon: Rollin Dover, MD;  Location: [facility] ENDOSCOPY;  Service: Gastroenterology;  Laterality: N/A;   SPINE SURGERY  1972   TRANSTHORACIC ECHOCARDIOGRAM  08/25/10; 05/2012; 03/23/16;12/2017   mild asymmetric LVH, normal systolic function, normal diastolic fxn, mild-to-mod mitral regurg, mild  aortic valve sclerosis and trace AI, mild aortic root dilatation. 2014 f/u showed EF 40-45%, mod LAE, A FIB.  02/2016 EF 40%, diffuse hypokinesis, grade 2 DD. 12/2017 EF 35-40%,diffuse hypokin,grd III DD, mild MR   WISDOM TOOTH EXTRACTION  06/27/2021   EF 50-55%, mild aortic root/ascending aorta dilatation (41-42 mm).   Social History   Socioeconomic History   Marital status: Married    Spouse name: Not on file   Number of children: Not on file   Years of education: Not on file   Highest education level: 12th grade  Occupational History   Not on file  Tobacco Use   Smoking status: Never   Smokeless tobacco: Never  Vaping Use   Vaping status: Never Used  Substance and Sexual Activity   Alcohol use: Yes    Alcohol/week: 3.0 - 4.0 standard drinks of alcohol    Types: 2 - 3 Cans of beer, 1 Shots of liquor per week   Drug use: No   Sexual activity: Not Currently  Other Topics Concern   Not on file  Social History Narrative   Not on file   Social Drivers of Health   Financial Resource Strain: Low Risk  (05/08/2024)   Overall Financial Resource Strain (CARDIA)    Difficulty of Paying Living Expenses: Not hard at all  Food Insecurity: No Food Insecurity (05/08/2024)   Hunger Vital Sign    Worried About Running Out of Food in the Last Year: Never true    Ran Out of Food in the Last Year: Never true  Transportation Needs: No Transportation Needs (05/08/2024)   PRAPARE - Administrator, Civil Service (Medical): No  Lack of Transportation (Non-Medical): No  Physical Activity: Inactive (05/08/2024)   Exercise Vital Sign    Days of Exercise per Week: 0 days    Minutes of Exercise per Session: Not on file  Stress: Stress Concern Present (05/08/2024)   [employer] of Occupational Health - Occupational Stress Questionnaire    Feeling of Stress: To some extent  Social Connections: Socially Integrated (05/08/2024)   Social Connection and Isolation Panel    Frequency  of Communication with Friends and Family: More than three times a week    Frequency of Social Gatherings with Friends and Family: Three times a week    Attends Religious Services: More than 4 times per year    Active Member of Clubs or Organizations: Yes    Attends Banker Meetings: More than 4 times per year    Marital Status: Married  Catering Manager Violence: Not At Risk (03/21/2024)   Humiliation, Afraid, Rape, and Kick questionnaire    Fear of Current or Ex-Partner: No    Emotionally Abused: No    Physically Abused: No    Sexually Abused: No   Current Outpatient Medications on File Prior to Visit  Medication Sig Dispense Refill   acetaminophen  (TYLENOL ) 650 MG CR tablet Take 650 mg by mouth every 8 (eight) hours as needed for pain.     COMBIGAN  0.2-0.5 % ophthalmic solution Place 1 drop into both eyes every 12 (twelve) hours.     ENTRESTO  97-103 MG TAKE 1 TABLET TWO TIMES A DAY 180 tablet 3   eplerenone  (INSPRA ) 25 MG tablet TAKE 1 TABLET DAILY 90 tablet 0   furosemide  (LASIX ) 40 MG tablet Take 1 tablet (40 mg total) by mouth as needed. For weight gain and/or edema 20 tablet 11   gabapentin  (NEURONTIN ) 300 MG capsule TAKE 2 CAPSULES TWICE A DAY 360 capsule 3   insulin  aspart (NOVOLOG  FLEXPEN) 100 UNIT/ML FlexPen Inject 10 units before each meal 30 mL 3   insulin  glargine (LANTUS  SOLOSTAR) 100 UNIT/ML Solostar Pen Inject 30-34 Units into the skin daily.     JARDIANCE  10 MG TABS tablet TAKE 1 TABLET DAILY 90 tablet 3   latanoprost  (XALATAN ) 0.005 % ophthalmic solution Place 1 drop into both eyes at bedtime.     lidocaine  (LIDODERM ) 5 % PLACE 1 PATCH ON THE SKIN DAILY, REMOVE AND DISCARD PATCH WITHIN 12 HOURS OR AS DIRECTED BY DOCTOR (OFFICE VISIT NEEDED FOR FURTHER REFILLS) 90 patch 3   meclizine  (ANTIVERT ) 25 MG tablet TAKE 1 TABLET THREE TIMES A DAY AS NEEDED FOR DIZZINESS 270 tablet 0   mirabegron  ER (MYRBETRIQ ) 25 MG TB24 tablet 1 tab po qd 90 tablet 1   omeprazole   (PRILOSEC) 40 MG capsule Take 1 capsule (40 mg total) by mouth daily. 90 capsule 1   oxybutynin  (DITROPAN -XL) 10 MG 24 hr tablet Take 1 tablet (10 mg total) by mouth at bedtime. 90 tablet 3   Oxycodone  HCl 10 MG TABS 1 tab po bid prn pain 60 tablet 0   polyethylene glycol (MIRALAX ) 17 g packet Take 17 g by mouth daily. (Patient taking differently: Take 17 g by mouth daily as needed.) 14 each 0   Rivaroxaban  (XARELTO ) 15 MG TABS tablet Take 1 tablet (15 mg total) by mouth daily with supper. 90 tablet 0   rOPINIRole  (REQUIP ) 1 MG tablet Take 1 tablet (1 mg total) by mouth at bedtime. 90 tablet 3   rosuvastatin  (CRESTOR ) 20 MG tablet Take 1 tablet (20 mg total)  by mouth at bedtime. 90 tablet 1   Semaglutide , 1 MG/DOSE, 4 MG/3ML SOPN Inject 1 mg as directed once a week. (Patient not taking: Reported on 05/13/2024) 9 mL 3   sertraline  (ZOLOFT ) 100 MG tablet TAKE 1 TABLET DAILY 90 tablet 0   No current facility-administered medications on file prior to visit.   No Known Allergies Family History  Problem Relation Age of Onset   Heart failure Mother    Stroke Mother    Stroke Father    Heart disease Sister    Heart disease Sister    Cancer Sister        liver   Cancer Brother        lung   Cancer Brother        lung   Heart disease Brother    PE: BP 120/60   Ht 5' 11 (1.803 m)   Wt 214 lb 6.4 oz (97.3 kg)   BMI 29.90 kg/m  Wt Readings from Last 10 Encounters:  05/29/24 214 lb 6.4 oz (97.3 kg)  05/09/24 213 lb 6.4 oz (96.8 kg)  04/24/24 213 lb 14.4 oz (97 kg)  04/16/24 211 lb 3.2 oz (95.8 kg)  03/28/24 207 lb (93.9 kg)  03/25/24 201 lb 12.8 oz (91.5 kg)  03/20/24 202 lb 13.2 oz (92 kg)  03/20/24 202 lb 3.2 oz (91.7 kg)  03/17/24 200 lb (90.7 kg)  03/11/24 203 lb (92.1 kg)   Constitutional: overweight, in NAD Eyes: EOMI, no exophthalmos ENT: no thyromegaly, no cervical lymphadenopathy Cardiovascular: RRR, No MRG Respiratory: CTA B Musculoskeletal: no deformities Skin: no  rashes Neurological: + tremor with outstretched hands  ASSESSMENT: 1. DM2, insulin -dependent, uncontrolled, with complications: - CAD with ?h/o AMI - Dr. Francyne - nonischemic CMP - CHF, s/p AICD - Afib, s/p pacemaker - cerebrovascular disease with h/o TIA - CKD stage III - peripheral neuropathy - ED  No family history of medullary thyroid  cancer or personal history of pancreatitis.  2. HL  PLAN:  1. Patient with longstanding, previously uncontrolled type 2 diabetes, on SGLT2 inhibitor, basal-bolus insulin  and previously on GLP-1 receptor agonist, but off Ozempic  due to constipation, mild nausea.  At last visit, he had relaxed his diet and missing many insulin  injections.  We discussed about focusing on taking Lantus  every day at bedtime and also taking NovoLog  15 minutes before every meal and we continue Jardiance .  I advised him to try to improve diet and cut down snacks, but did not start back on a GLP-1 receptor agonist.  HbA1c at last visit was higher, at 7.9%, increased from 6.8%. CGM interpretation: -At today's visit, we reviewed his CGM downloads: It appears that 73% of values are in target range (goal >70%), while 22% are higher than 180 (goal <25%), and 5% are lower than 70 (goal <4%).  The calculated average blood sugar is 6.7%.  The projected HbA1c for the next 3 months (GMI) is 6.7%. -Reviewing the CGM trends, sugars appear to be improved, especially in the last week, and he mentions that he just started Ozempic  back 2 weeks ago.  He did not take it this week but we discussed about continuing to take it.  He tolerated well.  Since sugars have improved even to the point of lows, especially in the second half of the day and sometimes early morning, I advised him to reduce the dose of his Lantus  and NovoLog . - I suggested to:  Patient Instructions  Please continue: - Jardiance  10 mg daily  before b'fast - Ozempic  1 mg weekly  Please reduce: - Lantus  22 units at bedtime -  NovoLog  15 min before meals: 8-10 units before brunch 8-10 units before dinner If you have to take the suppertime insulin  after the meal, take only up to 5 units.  Please return in 3-4 months.  - we checked his HbA1c: 6.8% (lower) - advised to check sugars at different times of the day - 4x a day, rotating check times - advised for yearly eye exams >> he is UTD - he has foot exams at the [facility] twice a year. He is on Neurontin  and ropinirole .  I previously suggested alpha lipoic acid and a diabetic neuropathy cream but he did not start this.  - return to clinic in 3-4 months  2. HL - Latest lipid panel from 04/2024 was reviewed: Fractions at goal with exception of an LDL above our target of less than 55: Lab Results  Component Value Date   CHOL 130 04/24/2024   HDL 45 04/24/2024   LDLCALC 66 04/24/2024   LDLDIRECT 103.0 06/22/2020   TRIG 99 04/24/2024   CHOLHDL 2.9 04/24/2024  - He continues on Crestor  20 mg twice a day without side effects.  Lela Fendt, MD PhD [facility] Endocrinology

## 2024-05-30 ENCOUNTER — Telehealth: Payer: Self-pay

## 2024-05-30 DIAGNOSIS — Z981 Arthrodesis status: Secondary | ICD-10-CM | POA: Diagnosis not present

## 2024-05-30 DIAGNOSIS — I5022 Chronic systolic (congestive) heart failure: Secondary | ICD-10-CM | POA: Diagnosis not present

## 2024-05-30 DIAGNOSIS — I4821 Permanent atrial fibrillation: Secondary | ICD-10-CM | POA: Diagnosis not present

## 2024-05-30 DIAGNOSIS — M1712 Unilateral primary osteoarthritis, left knee: Secondary | ICD-10-CM | POA: Diagnosis not present

## 2024-05-30 DIAGNOSIS — I130 Hypertensive heart and chronic kidney disease with heart failure and stage 1 through stage 4 chronic kidney disease, or unspecified chronic kidney disease: Secondary | ICD-10-CM | POA: Diagnosis not present

## 2024-05-30 DIAGNOSIS — Z4789 Encounter for other orthopedic aftercare: Secondary | ICD-10-CM | POA: Diagnosis not present

## 2024-05-30 NOTE — Telephone Encounter (Signed)
 Left detailed message on secure VM for [facility] with provider instructions, advised him to provide call back if any questions or concerns.

## 2024-05-30 NOTE — Telephone Encounter (Signed)
 Yes, okay for physical therapy.  Weight noted. If weight climbs up over 215 pounds on his home scale then he should take 1 Lasix  40 mg tab and notify our office or his cardiologist office so he can get evaluated.

## 2024-05-30 NOTE — Telephone Encounter (Signed)
 Copied from CRM [id number]. Topic: Clinical - Home Health Verbal Orders >> May 30, 2024 10:21 AM Roselie BROCKS wrote: Caller/Agency: [facility]  Callback Number: [phone] can leave a message,Dennis  Service Requested: Physical Therapy Frequency: verbal order for skilled nurse for evaluate for medication, Also several Medication changes, No longer taking Dilaudid .  Added:oxycodone  10 mg 1 twice a day furosemide  (LASIX  40 mg 1 twice a day tylonal 650 mg every 8 hours as needed mirabegron  ER 25 MG 1 tablet daily  Any new concerns about the patient? Yes Weight last week 204 Weight yesterday 214 with shoes and clothes Weight today 207

## 2024-06-02 ENCOUNTER — Ambulatory Visit: Admitting: Orthopedic Surgery

## 2024-06-02 ENCOUNTER — Telehealth: Payer: Self-pay

## 2024-06-02 DIAGNOSIS — I5022 Chronic systolic (congestive) heart failure: Secondary | ICD-10-CM | POA: Diagnosis not present

## 2024-06-02 DIAGNOSIS — M1712 Unilateral primary osteoarthritis, left knee: Secondary | ICD-10-CM | POA: Diagnosis not present

## 2024-06-02 DIAGNOSIS — I4821 Permanent atrial fibrillation: Secondary | ICD-10-CM | POA: Diagnosis not present

## 2024-06-02 DIAGNOSIS — I130 Hypertensive heart and chronic kidney disease with heart failure and stage 1 through stage 4 chronic kidney disease, or unspecified chronic kidney disease: Secondary | ICD-10-CM | POA: Diagnosis not present

## 2024-06-02 DIAGNOSIS — Z981 Arthrodesis status: Secondary | ICD-10-CM | POA: Diagnosis not present

## 2024-06-02 DIAGNOSIS — Z4789 Encounter for other orthopedic aftercare: Secondary | ICD-10-CM | POA: Diagnosis not present

## 2024-06-02 NOTE — Telephone Encounter (Signed)
 Home health orders received 06/02/24 for Mason [name] health initiation orders: Yes.  Home health re-certification orders: No. Patient last seen by ordering physician for this condition: 05/09/24. Must be less than 90 days for re-certification and less than 30 days prior for initiation. Visit must have been for the condition the orders are being placed.  Patient meets criteria for Physician to sign orders: Yes.        Current med list has been attached: Yes        Orders placed on physicians desk for signature: 06/02/24 (date) If patient does not meet criteria for orders to be signed: pt was called to schedule appt. Appt is scheduled for N/A.    Placed on PCP desk to review and sign, if appropriate.   Ashley Montminy D Darris Staiger

## 2024-06-02 NOTE — Telephone Encounter (Signed)
 Copied from CRM [id number]. Topic: Clinical - Home Health Verbal Orders >> Jun 02, 2024  3:35 PM Revonda D wrote: Caller/Agency: Verneita with [facility]  Callback Number: [phone], secure line  Service Requested: Skilled Nursing Frequency: 1 week 4, 1 PRN Any new concerns about the patient? No   Okay for verbal orders?

## 2024-06-02 NOTE — Telephone Encounter (Signed)
 Received fax from [facility]   Order # [id number]   [place] inbox front office

## 2024-06-03 ENCOUNTER — Telehealth: Payer: Self-pay

## 2024-06-03 DIAGNOSIS — I5022 Chronic systolic (congestive) heart failure: Secondary | ICD-10-CM | POA: Diagnosis not present

## 2024-06-03 DIAGNOSIS — I4821 Permanent atrial fibrillation: Secondary | ICD-10-CM | POA: Diagnosis not present

## 2024-06-03 DIAGNOSIS — Z981 Arthrodesis status: Secondary | ICD-10-CM | POA: Diagnosis not present

## 2024-06-03 DIAGNOSIS — Z4789 Encounter for other orthopedic aftercare: Secondary | ICD-10-CM | POA: Diagnosis not present

## 2024-06-03 DIAGNOSIS — I130 Hypertensive heart and chronic kidney disease with heart failure and stage 1 through stage 4 chronic kidney disease, or unspecified chronic kidney disease: Secondary | ICD-10-CM | POA: Diagnosis not present

## 2024-06-03 DIAGNOSIS — M1712 Unilateral primary osteoarthritis, left knee: Secondary | ICD-10-CM | POA: Diagnosis not present

## 2024-06-03 NOTE — Telephone Encounter (Signed)
 Verneita, RN from [facility] wanting clarifications regarding patient's Novolog . Advised what Dr. Trixie had mentioned on patients last OV on 05/29/2024. Please reduce: - Lantus  22 units at bedtime - NovoLog  15 min before meals: 8-10 units before brunch 8-10 units before dinner If you have to take the suppertime insulin  after the meal, take only up to 5 units.   Please return in 3-4 months.   - we checked his HbA1c: 6.8% (lower) - advised to check sugars at different times of the day - 4x a day, rotating check times  Verneita verified and verbalized understanding.

## 2024-06-03 NOTE — Telephone Encounter (Signed)
 Yes okay

## 2024-06-03 NOTE — Telephone Encounter (Signed)
 Approved verbal orders given to [place].

## 2024-06-05 ENCOUNTER — Telehealth: Payer: Self-pay

## 2024-06-05 ENCOUNTER — Other Ambulatory Visit: Payer: Self-pay | Admitting: Family Medicine

## 2024-06-05 NOTE — Telephone Encounter (Signed)
 Received fax from [facility]   Order # [id number]   [place] inbox front office

## 2024-06-06 DIAGNOSIS — I4821 Permanent atrial fibrillation: Secondary | ICD-10-CM | POA: Diagnosis not present

## 2024-06-06 DIAGNOSIS — I130 Hypertensive heart and chronic kidney disease with heart failure and stage 1 through stage 4 chronic kidney disease, or unspecified chronic kidney disease: Secondary | ICD-10-CM | POA: Diagnosis not present

## 2024-06-06 DIAGNOSIS — Z981 Arthrodesis status: Secondary | ICD-10-CM | POA: Diagnosis not present

## 2024-06-06 DIAGNOSIS — M1712 Unilateral primary osteoarthritis, left knee: Secondary | ICD-10-CM | POA: Diagnosis not present

## 2024-06-06 DIAGNOSIS — I5022 Chronic systolic (congestive) heart failure: Secondary | ICD-10-CM | POA: Diagnosis not present

## 2024-06-06 DIAGNOSIS — Z4789 Encounter for other orthopedic aftercare: Secondary | ICD-10-CM | POA: Diagnosis not present

## 2024-06-10 DIAGNOSIS — M4310 Spondylolisthesis, site unspecified: Secondary | ICD-10-CM | POA: Diagnosis not present

## 2024-06-13 DIAGNOSIS — Z4789 Encounter for other orthopedic aftercare: Secondary | ICD-10-CM | POA: Diagnosis not present

## 2024-06-13 DIAGNOSIS — M1712 Unilateral primary osteoarthritis, left knee: Secondary | ICD-10-CM | POA: Diagnosis not present

## 2024-06-13 DIAGNOSIS — Z981 Arthrodesis status: Secondary | ICD-10-CM | POA: Diagnosis not present

## 2024-06-13 DIAGNOSIS — I5022 Chronic systolic (congestive) heart failure: Secondary | ICD-10-CM | POA: Diagnosis not present

## 2024-06-13 DIAGNOSIS — I130 Hypertensive heart and chronic kidney disease with heart failure and stage 1 through stage 4 chronic kidney disease, or unspecified chronic kidney disease: Secondary | ICD-10-CM | POA: Diagnosis not present

## 2024-06-13 DIAGNOSIS — I4821 Permanent atrial fibrillation: Secondary | ICD-10-CM | POA: Diagnosis not present

## 2024-06-16 DIAGNOSIS — Z4789 Encounter for other orthopedic aftercare: Secondary | ICD-10-CM | POA: Diagnosis not present

## 2024-06-16 DIAGNOSIS — I130 Hypertensive heart and chronic kidney disease with heart failure and stage 1 through stage 4 chronic kidney disease, or unspecified chronic kidney disease: Secondary | ICD-10-CM | POA: Diagnosis not present

## 2024-06-16 DIAGNOSIS — I5022 Chronic systolic (congestive) heart failure: Secondary | ICD-10-CM | POA: Diagnosis not present

## 2024-06-16 DIAGNOSIS — I4821 Permanent atrial fibrillation: Secondary | ICD-10-CM | POA: Diagnosis not present

## 2024-06-16 DIAGNOSIS — M1712 Unilateral primary osteoarthritis, left knee: Secondary | ICD-10-CM | POA: Diagnosis not present

## 2024-06-16 DIAGNOSIS — Z981 Arthrodesis status: Secondary | ICD-10-CM | POA: Diagnosis not present

## 2024-06-18 ENCOUNTER — Telehealth: Payer: Self-pay | Admitting: Orthopedic Surgery

## 2024-06-18 NOTE — Telephone Encounter (Signed)
 Pt called wanting to know where his MRI is scheduled. He has heard from 2 different places. One in [place] and one in [place]. He is confused as to where to go on Dec 9th. Call back number is [phone].

## 2024-06-18 NOTE — Telephone Encounter (Signed)
 I called and lmovm that the order was originally sent to the [facility] location but then he had requested it go to [facility] so it was sent there. I advised him to let the [place] location know that it is to be done in [facility]

## 2024-06-20 DIAGNOSIS — M1712 Unilateral primary osteoarthritis, left knee: Secondary | ICD-10-CM | POA: Diagnosis not present

## 2024-06-20 DIAGNOSIS — N183 Chronic kidney disease, stage 3 unspecified: Secondary | ICD-10-CM | POA: Diagnosis not present

## 2024-06-20 DIAGNOSIS — E1122 Type 2 diabetes mellitus with diabetic chronic kidney disease: Secondary | ICD-10-CM | POA: Diagnosis not present

## 2024-06-20 DIAGNOSIS — I700 Atherosclerosis of aorta: Secondary | ICD-10-CM | POA: Diagnosis not present

## 2024-06-20 DIAGNOSIS — I4821 Permanent atrial fibrillation: Secondary | ICD-10-CM | POA: Diagnosis not present

## 2024-06-20 DIAGNOSIS — M11262 Other chondrocalcinosis, left knee: Secondary | ICD-10-CM | POA: Diagnosis not present

## 2024-06-20 DIAGNOSIS — Z9181 History of falling: Secondary | ICD-10-CM | POA: Diagnosis not present

## 2024-06-20 DIAGNOSIS — Z794 Long term (current) use of insulin: Secondary | ICD-10-CM | POA: Diagnosis not present

## 2024-06-20 DIAGNOSIS — I442 Atrioventricular block, complete: Secondary | ICD-10-CM | POA: Diagnosis not present

## 2024-06-20 DIAGNOSIS — Z981 Arthrodesis status: Secondary | ICD-10-CM | POA: Diagnosis not present

## 2024-06-20 DIAGNOSIS — Z7901 Long term (current) use of anticoagulants: Secondary | ICD-10-CM | POA: Diagnosis not present

## 2024-06-20 DIAGNOSIS — M48061 Spinal stenosis, lumbar region without neurogenic claudication: Secondary | ICD-10-CM | POA: Diagnosis not present

## 2024-06-20 DIAGNOSIS — Z7984 Long term (current) use of oral hypoglycemic drugs: Secondary | ICD-10-CM | POA: Diagnosis not present

## 2024-06-20 DIAGNOSIS — E785 Hyperlipidemia, unspecified: Secondary | ICD-10-CM | POA: Diagnosis not present

## 2024-06-20 DIAGNOSIS — I130 Hypertensive heart and chronic kidney disease with heart failure and stage 1 through stage 4 chronic kidney disease, or unspecified chronic kidney disease: Secondary | ICD-10-CM | POA: Diagnosis not present

## 2024-06-20 DIAGNOSIS — M9983 Other biomechanical lesions of lumbar region: Secondary | ICD-10-CM | POA: Diagnosis not present

## 2024-06-20 DIAGNOSIS — Z4789 Encounter for other orthopedic aftercare: Secondary | ICD-10-CM | POA: Diagnosis not present

## 2024-06-20 DIAGNOSIS — Z7985 Long-term (current) use of injectable non-insulin antidiabetic drugs: Secondary | ICD-10-CM | POA: Diagnosis not present

## 2024-06-20 DIAGNOSIS — Z950 Presence of cardiac pacemaker: Secondary | ICD-10-CM | POA: Diagnosis not present

## 2024-06-20 DIAGNOSIS — I5022 Chronic systolic (congestive) heart failure: Secondary | ICD-10-CM | POA: Diagnosis not present

## 2024-06-24 DIAGNOSIS — H43811 Vitreous degeneration, right eye: Secondary | ICD-10-CM | POA: Diagnosis not present

## 2024-06-24 DIAGNOSIS — H43812 Vitreous degeneration, left eye: Secondary | ICD-10-CM | POA: Diagnosis not present

## 2024-06-24 DIAGNOSIS — H401131 Primary open-angle glaucoma, bilateral, mild stage: Secondary | ICD-10-CM | POA: Diagnosis not present

## 2024-06-24 DIAGNOSIS — H353132 Nonexudative age-related macular degeneration, bilateral, intermediate dry stage: Secondary | ICD-10-CM | POA: Diagnosis not present

## 2024-06-24 DIAGNOSIS — H35372 Puckering of macula, left eye: Secondary | ICD-10-CM | POA: Diagnosis not present

## 2024-06-25 ENCOUNTER — Other Ambulatory Visit: Payer: Self-pay | Admitting: Family Medicine

## 2024-06-25 DIAGNOSIS — I5022 Chronic systolic (congestive) heart failure: Secondary | ICD-10-CM | POA: Diagnosis not present

## 2024-06-25 DIAGNOSIS — Z981 Arthrodesis status: Secondary | ICD-10-CM | POA: Diagnosis not present

## 2024-06-25 DIAGNOSIS — I130 Hypertensive heart and chronic kidney disease with heart failure and stage 1 through stage 4 chronic kidney disease, or unspecified chronic kidney disease: Secondary | ICD-10-CM | POA: Diagnosis not present

## 2024-06-25 DIAGNOSIS — Z4789 Encounter for other orthopedic aftercare: Secondary | ICD-10-CM | POA: Diagnosis not present

## 2024-06-25 DIAGNOSIS — I4821 Permanent atrial fibrillation: Secondary | ICD-10-CM | POA: Diagnosis not present

## 2024-06-25 DIAGNOSIS — M1712 Unilateral primary osteoarthritis, left knee: Secondary | ICD-10-CM | POA: Diagnosis not present

## 2024-06-26 ENCOUNTER — Ambulatory Visit: Attending: Cardiovascular Disease

## 2024-06-26 ENCOUNTER — Encounter

## 2024-06-26 LAB — CUP PACEART REMOTE DEVICE CHECK
Battery Remaining Longevity: 2 mo
Battery Voltage: 2.75 V
Brady Statistic AP VP Percent: 0 %
Brady Statistic AP VS Percent: 0 %
Brady Statistic AS VP Percent: 0 %
Brady Statistic AS VS Percent: 0 %
Brady Statistic RA Percent Paced: 0 %
Brady Statistic RV Percent Paced: 99.92 %
Date Time Interrogation Session: 20251204063328
HighPow Impedance: 71 Ohm
Implantable Lead Connection Status: 753985
Implantable Lead Connection Status: 753985
Implantable Lead Connection Status: 753985
Implantable Lead Implant Date: 20130715
Implantable Lead Implant Date: 20180614
Implantable Lead Implant Date: 20180614
Implantable Lead Location: 753858
Implantable Lead Location: 753859
Implantable Lead Location: 753860
Implantable Lead Model: 4598
Implantable Pulse Generator Implant Date: 20180614
Lead Channel Impedance Value: 201.488
Lead Channel Impedance Value: 211.891
Lead Channel Impedance Value: 218.104
Lead Channel Impedance Value: 249.509
Lead Channel Impedance Value: 265.661
Lead Channel Impedance Value: 342 Ohm
Lead Channel Impedance Value: 361 Ohm
Lead Channel Impedance Value: 399 Ohm
Lead Channel Impedance Value: 456 Ohm
Lead Channel Impedance Value: 513 Ohm
Lead Channel Impedance Value: 532 Ohm
Lead Channel Impedance Value: 551 Ohm
Lead Channel Impedance Value: 665 Ohm
Lead Channel Impedance Value: 703 Ohm
Lead Channel Impedance Value: 779 Ohm
Lead Channel Impedance Value: 836 Ohm
Lead Channel Impedance Value: 874 Ohm
Lead Channel Impedance Value: 950 Ohm
Lead Channel Pacing Threshold Amplitude: 0.5 V
Lead Channel Pacing Threshold Amplitude: 0.625 V
Lead Channel Pacing Threshold Pulse Width: 0.4 ms
Lead Channel Pacing Threshold Pulse Width: 1 ms
Lead Channel Sensing Intrinsic Amplitude: 0.625 mV
Lead Channel Sensing Intrinsic Amplitude: 13.75 mV
Lead Channel Sensing Intrinsic Amplitude: 13.75 mV
Lead Channel Setting Pacing Amplitude: 1 V
Lead Channel Setting Pacing Amplitude: 2 V
Lead Channel Setting Pacing Pulse Width: 0.4 ms
Lead Channel Setting Pacing Pulse Width: 1 ms
Lead Channel Setting Sensing Sensitivity: 0.3 mV
Zone Setting Status: 755011

## 2024-07-01 ENCOUNTER — Telehealth: Payer: Self-pay | Admitting: Orthopedic Surgery

## 2024-07-01 DIAGNOSIS — M25562 Pain in left knee: Secondary | ICD-10-CM | POA: Diagnosis not present

## 2024-07-01 DIAGNOSIS — G8929 Other chronic pain: Secondary | ICD-10-CM | POA: Diagnosis not present

## 2024-07-01 NOTE — Telephone Encounter (Signed)
 Randall From [facility] called stating they canceled pt MRI due to pt having  pacemaker. Pt need new referral for facility that can do pacemaker pts. Randall number is [phone].

## 2024-07-01 NOTE — Telephone Encounter (Signed)
 I caled and left message with Randall

## 2024-07-02 DIAGNOSIS — Z981 Arthrodesis status: Secondary | ICD-10-CM | POA: Diagnosis not present

## 2024-07-02 DIAGNOSIS — I4821 Permanent atrial fibrillation: Secondary | ICD-10-CM | POA: Diagnosis not present

## 2024-07-02 DIAGNOSIS — I5022 Chronic systolic (congestive) heart failure: Secondary | ICD-10-CM | POA: Diagnosis not present

## 2024-07-02 DIAGNOSIS — L2989 Other pruritus: Secondary | ICD-10-CM | POA: Diagnosis not present

## 2024-07-02 DIAGNOSIS — L538 Other specified erythematous conditions: Secondary | ICD-10-CM | POA: Diagnosis not present

## 2024-07-02 DIAGNOSIS — I130 Hypertensive heart and chronic kidney disease with heart failure and stage 1 through stage 4 chronic kidney disease, or unspecified chronic kidney disease: Secondary | ICD-10-CM | POA: Diagnosis not present

## 2024-07-02 DIAGNOSIS — D1801 Hemangioma of skin and subcutaneous tissue: Secondary | ICD-10-CM | POA: Diagnosis not present

## 2024-07-02 DIAGNOSIS — M1712 Unilateral primary osteoarthritis, left knee: Secondary | ICD-10-CM | POA: Diagnosis not present

## 2024-07-02 DIAGNOSIS — L820 Inflamed seborrheic keratosis: Secondary | ICD-10-CM | POA: Diagnosis not present

## 2024-07-02 DIAGNOSIS — L570 Actinic keratosis: Secondary | ICD-10-CM | POA: Diagnosis not present

## 2024-07-02 DIAGNOSIS — Z4789 Encounter for other orthopedic aftercare: Secondary | ICD-10-CM | POA: Diagnosis not present

## 2024-07-02 DIAGNOSIS — L218 Other seborrheic dermatitis: Secondary | ICD-10-CM | POA: Diagnosis not present

## 2024-07-03 ENCOUNTER — Other Ambulatory Visit: Payer: Self-pay

## 2024-07-03 ENCOUNTER — Other Ambulatory Visit: Payer: Self-pay | Admitting: Pharmacist

## 2024-07-03 DIAGNOSIS — Z7901 Long term (current) use of anticoagulants: Secondary | ICD-10-CM

## 2024-07-03 DIAGNOSIS — I4821 Permanent atrial fibrillation: Secondary | ICD-10-CM

## 2024-07-03 MED ORDER — SURE COMFORT PEN NEEDLES 31G X 8 MM MISC: 5 refills | Status: DC

## 2024-07-03 NOTE — Progress Notes (Addendum)
 07/03/2024 Name: Mason [name] MRN: [id number] DOB: 07/12/1941  Chief Complaint  Patient presents with   Medication Management    Mason [name] is a 83 y.o. year old male who presented for a telephone visit.   They were referred to the pharmacist by their Case Management Team  for assistance in managing complex medication management.    Subjective:  Care Team: Primary Care Provider: Candise Aleene DEL, MD ; Next Scheduled Visit: 08/11/2024 Cardiologist: Dr Inocencio; Next Scheduled Visit: 08/06/2024 Endocrinologist Dr Mason; Next Scheduled Visit: 2026 Neurosurgeon: Dr Louis; Next Scheduled Visit: unable to see  Ortho - Dr Georgina; for knee pain - 07/16/2024  Medication Access/Adherence  Current Pharmacy:  [id number] - [place], [place] - [address] [facility] [address] [place] [address] Phone: [phone] Fax: [phone]  [facility] - [place], [place] - [address] [place] [address] Phone: [phone] Fax: [phone]   Patient reports affordability concerns with their medications: No  Patient reports access/transportation concerns to their pharmacy: No  Patient reports adherence concerns with their medications:  No   Wife helps with medication management and administration.   Current diabetes regimen:  Lantus  22 units at bedtime  Novolog  8 to 10 units with meals Jardiance  10mg  daily.  Ozempic  restarted in October - 1mg  weekly   Wt Readings from Last 3 Encounters:  05/29/24 214 lb 6.4 oz (97.3 kg)  05/09/24 213 lb 6.4 oz (96.8 kg)  04/24/24 213 lb 14.4 oz (97 kg)   Taking Xarelto  for afib. Previously was taking 20mg  daily but was recently lowered to 15mg  daily. Suspect dose adjusted during hospitalization for renal and weight changes.  Wt = 200 lbs / Ht = 5'11 / Scr = 1.51 - hospital values Wt = 214lbs / Ht = 5'11 / last Scr = 1.4 - most recent  values    Reviewed recent refills.  Atorvastatin shows improved adherence over the last 6 months - at 98% for 2025 - he will be due to refill again next week. He has 1 refill remaining.   He will also be due to refill Xarelto  next week.   Objective:  BP Readings from Last 3 Encounters:  05/29/24 120/60  05/09/24 95/62  04/24/24 90/60     Lab Results  Component Value Date   HGBA1C 6.8 (A) 05/29/2024    Lab Results  Component Value Date   CREATININE 1.40 (H) 03/20/2024   BUN 18 03/20/2024   NA 136 03/20/2024   K 4.0 03/20/2024   CL 103 03/20/2024   CO2 22 03/20/2024    Lab Results  Component Value Date   CHOL 130 04/24/2024   HDL 45 04/24/2024   LDLCALC 66 04/24/2024   LDLDIRECT 103.0 06/22/2020   TRIG 99 04/24/2024   CHOLHDL 2.9 04/24/2024    Medications Reviewed Today     Reviewed by Carla Milling, RPH-CPP (Pharmacist) on 07/03/24 at 1137  Med List Status: <None>   Medication Order Taking? Sig Documenting Provider Last Dose Status Informant  acetaminophen  (TYLENOL ) 650 MG CR tablet [id number]  Take 650 mg by mouth every 8 (eight) hours as needed for pain. [provider]  Active   COMBIGAN  0.2-0.5 % ophthalmic solution [id number]  Place 1 drop into both eyes every 12 (twelve) hours. [provider]  Active Self, Pharmacy Records  Med Note Mason [name]   Fri Jun 02, 2016  1:39 PM)    ENTRESTO  97-103 MG [id number]  TAKE 1 TABLET TWO TIMES A DAY Mason, Mihai, MD  Active   eplerenone  (INSPRA ) 25 MG tablet [id number]  TAKE 1 TABLET DAILY Mason, Mihai, MD  Active   furosemide  (LASIX ) 40 MG tablet [id number]  Take 1 tablet (40 mg total) by mouth as needed. For weight gain and/or edema Mason, Mihai, MD  Active   gabapentin  (NEURONTIN ) 300 MG capsule [id number]  TAKE 2 CAPSULES TWICE A DAY McGowen, Aleene DEL, MD  Active Self, Pharmacy Records  insulin  aspart (NOVOLOG  FLEXPEN) 100 UNIT/ML FlexPen [id number]  Inject 8-10 units before each  meal, 2-3x a day Mason File, MD  Active   insulin  glargine (LANTUS  SOLOSTAR) 100 UNIT/ML Solostar Pen [id number]  Inject 22 Units into the skin daily. Mason File, MD  Active   Insulin  Pen Needle (SURE COMFORT PEN NEEDLES) 31G X 8 MM MISC [id number]  Use to administer insulin . McGowen, Philip H, MD  Active   JARDIANCE  10 MG TABS tablet [id number]  TAKE 1 TABLET DAILY Mason File, MD  Active Self, Pharmacy Records  latanoprost  (XALATAN ) 0.005 % ophthalmic solution [id number]  Place 1 drop into both eyes at bedtime. [provider]  Active Self, Pharmacy Records  lidocaine  (LIDODERM ) 5 % [id number]  PLACE 1 PATCH ON THE SKIN DAILY, REMOVE AND DISCARD PATCH WITHIN 12 HOURS OR AS DIRECTED BY DOCTOR (OFFICE VISIT NEEDED FOR FURTHER REFILLS) McGowen, Aleene DEL, MD  Active   meclizine  (ANTIVERT ) 25 MG tablet [id number]  TAKE 1 TABLET THREE TIMES A DAY AS NEEDED FOR DIZZINESS McGowen, Aleene DEL, MD  Active   mirabegron  ER (MYRBETRIQ ) 25 MG TB24 tablet [id number]  1 tab po qd McGowen, Philip H, MD  Active   omeprazole  (PRILOSEC) 40 MG capsule [id number]  Take 1 capsule (40 mg total) by mouth daily. McGowen, Philip H, MD  Active   oxybutynin  (DITROPAN -XL) 10 MG 24 hr tablet [id number]  TAKE 1 TABLET AT BEDTIME McGowen, Aleene DEL, MD  Active   Oxycodone  HCl 10 MG TABS [id number]  1 tab po bid prn pain McGowen, Aleene DEL, MD  Active   polyethylene glycol (MIRALAX ) 17 g packet [id number]  Take 17 g by mouth daily.  Patient taking differently: Take 17 g by mouth daily as needed.   Mason Fairy DASEN, DO  Active Self, Pharmacy Records  Rivaroxaban  (XARELTO ) 15 MG TABS tablet [id number]  Take 1 tablet (15 mg total) by mouth daily with supper. McGowen, Philip H, MD  Active   rOPINIRole  (REQUIP ) 1 MG tablet [id number]  Take 1 tablet (1 mg total) by mouth at bedtime. McGowen, Philip H, MD  Active Self, Pharmacy Records  rosuvastatin  (CRESTOR ) 20 MG tablet [id number]  Take 1 tablet (20 mg total) by mouth at  bedtime. McGowen, Philip H, MD  Active   Semaglutide , 1 MG/DOSE, 4 MG/3ML SOPN [id number]  Inject 1 mg as directed once a week.  Patient not taking: Reported on 05/29/2024   Mason File, MD  Active Self, Pharmacy Records           Med Note TERENCE CARE D   Fri May 09, 2024  1:21 PM)    sertraline  (ZOLOFT ) 100 MG tablet [id number]  TAKE 1 TABLET DAILY McGowen, Aleene DEL, MD  Active Self, Pharmacy Records              Assessment/Plan:   Medication Management / Polypharmacy:  -  Reviewed medication list and updated - Continue to monitor serum Creatinine. Based on last serum creatinine and patient's actual body weight his Creatinine Clearance is > 50 mL/min which would indicate recommended dose of Xarelto  of 20mg  daily. Since patient is due to refill Xarelto  soon and will need updated Rx will reach out to cardiology and PCP to see if they would recommend continuing 15mg  daily, go back to 20mg  daily or recheck serum creatinine and then decide.  - Reminded patient to take Novolog  10 to 15 minutes prior to meals for best result in preventing post prandial blood glucose increases.  - Reminded that atorvastatin and Xarelto  will be due next week.   Follow Up Plan: 8 to 12 weeks.   Madelin Ray, PharmD Clinical Pharmacist [facility]  Population Health [phone]  07/10/2024 - Addendum Message  Inocencio Soyla Lunger, MD  Ray Madelin, RPH-CPP; Candise Aleene DEL, MD [facility] to increase dose   Send in updated Rx for Xarelto  20mg  once daily #90 and no refills.   Madelin Ray, PharmD Clinical Pharmacist [facility]  Population Health [phone]

## 2024-07-07 ENCOUNTER — Other Ambulatory Visit: Payer: Self-pay | Admitting: Family Medicine

## 2024-07-07 ENCOUNTER — Other Ambulatory Visit: Payer: Self-pay | Admitting: Cardiovascular Disease

## 2024-07-07 MED ORDER — SERTRALINE HCL 100 MG PO TABS: 100.0000 mg | ORAL_TABLET | Freq: Every day | ORAL | 0 refills | Status: AC

## 2024-07-07 NOTE — Telephone Encounter (Signed)
 Copied from CRM #[id number]. Topic: Clinical - Medication Refill >> Jul 07, 2024 11:10 AM Ashley R wrote: Medication:  sertraline  (ZOLOFT ) 100 MG tablet   Has the patient contacted their pharmacy? Yes   This is the patient's preferred pharmacy:   [facility] - [place], [place] - [address] [place] [address] Phone: [phone] Fax: [phone]  Is this the correct pharmacy for this prescription? Yes If no, delete pharmacy and type the correct one.   Has the prescription been filled recently? Yes  Is the patient out of the medication? Yes  Has the patient been seen for an appointment in the last year OR does the patient have an upcoming appointment? Yes  Can we respond through MyChart? Yes  Agent: Please be advised that Rx refills may take up to 3 business days. We ask that you follow-up with your pharmacy.

## 2024-07-10 MED ORDER — RIVAROXABAN 20 MG PO TABS: 20.0000 mg | ORAL_TABLET | Freq: Every day | ORAL | 0 refills | Status: AC

## 2024-07-10 NOTE — Addendum Note (Signed)
 Addended by: CARLA MILLING B on: 07/10/2024 02:57 PM   Modules accepted: Orders

## 2024-07-16 ENCOUNTER — Ambulatory Visit: Admitting: Orthopedic Surgery

## 2024-07-16 DIAGNOSIS — M25562 Pain in left knee: Secondary | ICD-10-CM | POA: Diagnosis not present

## 2024-07-16 NOTE — Progress Notes (Signed)
 Orthopedic Surgery Progress Note     Assessment: Patient is a 83 y.o. male with left knee pain     Plan: -Patient with continued knee pain.  I have now referred him to [facility] for an [facility] of the knee to work this up further.  Both facilities have said that it is too risky with his cardiac device.  He is already had an MRI of his lumbar spine with this cardiac device without issue.  He has had a CT scan and a nuclear medicine bone scan.  His lesion does not appear to be malignant based on the radiologist interpretation of the bone scan.  I do not think a repeat CT scan would be helpful in this case.  His x-rays do not show significant arthritis.  Given his inability to get a MRI scan with persistent knee pain, I told him the next best way to look at the knee would be to do a diagnostic scope.  I referred him to my partner Dr. B to see if he is a candidate for this.  I am still not sure that this is coming from his knee since he also has paresthesias and numbness and it does not sound mechanical in nature -Return to office on an as needed basis ___________________________________________________________________________   Subjective: Patient continues to have left knee pain.  He feels that over the anterior medial distal thigh and along the superior medial aspect of the knee.  He notes the pain worse with sitting or laying down.  He does not have much pain with weightbearing.  This all started after he was recovering from spine surgery.  He mentions some numbness and paresthesias in the same distribution as the pain.  He has not noticed any significant changes in his symptoms since he was last seen in the office.  He has attempted to get an MRI but has been [facility] by [facility] and [facility] for concerns with his cardiac device.     Physical Exam:   General: no acute distress, appears stated age Neurologic: alert, answering questions appropriately, following commands Respiratory: unlabored  breathing on room air, symmetric chest rise Psychiatric: appropriate affect, normal cadence to speech   MSK:    - Right lower extremity             Mild TTP over the medial aspect of the distal thigh just proximal to the knee.  No other tenderness to palpation.  No palpable masses. Ambulating without assistive devices Fires hip flexors, quadriceps, hamstrings, tibialis anterior, gastrocnemius and soleus, extensor hallucis longus Plantarflexes and dorsiflexes toes Sensation intact to light touch in sural, saphenous, tibial, deep peroneal, and superficial peroneal nerve distributions Foot warm and well perfused     Imaging:  None obtained at today's visit     Patient name: Mason [name] Patient MRN: [id number] Date: 07/16/2024

## 2024-07-27 ENCOUNTER — Ambulatory Visit: Attending: Cardiovascular Disease

## 2024-07-27 DIAGNOSIS — I4821 Permanent atrial fibrillation: Secondary | ICD-10-CM

## 2024-07-28 ENCOUNTER — Encounter

## 2024-07-28 LAB — CUP PACEART REMOTE DEVICE CHECK
Battery Remaining Longevity: 2 mo
Battery Voltage: 2.77 V
Brady Statistic AP VP Percent: 0 %
Brady Statistic AP VS Percent: 0 %
Brady Statistic AS VP Percent: 0 %
Brady Statistic AS VS Percent: 0 %
Brady Statistic RA Percent Paced: 0 %
Brady Statistic RV Percent Paced: 99.95 %
Date Time Interrogation Session: 20260104042306
HighPow Impedance: 74 Ohm
Implantable Lead Connection Status: 753985
Implantable Lead Connection Status: 753985
Implantable Lead Connection Status: 753985
Implantable Lead Implant Date: 20130715
Implantable Lead Implant Date: 20180614
Implantable Lead Implant Date: 20180614
Implantable Lead Location: 753858
Implantable Lead Location: 753859
Implantable Lead Location: 753860
Implantable Lead Model: 4598
Implantable Pulse Generator Implant Date: 20180614
Lead Channel Impedance Value: 205.114
Lead Channel Impedance Value: 215.064
Lead Channel Impedance Value: 218.104
Lead Channel Impedance Value: 250.943
Lead Channel Impedance Value: 270.667
Lead Channel Impedance Value: 342 Ohm
Lead Channel Impedance Value: 361 Ohm
Lead Channel Impedance Value: 418 Ohm
Lead Channel Impedance Value: 475 Ohm
Lead Channel Impedance Value: 532 Ohm
Lead Channel Impedance Value: 532 Ohm
Lead Channel Impedance Value: 551 Ohm
Lead Channel Impedance Value: 722 Ohm
Lead Channel Impedance Value: 760 Ohm
Lead Channel Impedance Value: 836 Ohm
Lead Channel Impedance Value: 874 Ohm
Lead Channel Impedance Value: 893 Ohm
Lead Channel Impedance Value: 988 Ohm
Lead Channel Pacing Threshold Amplitude: 0.375 V
Lead Channel Pacing Threshold Amplitude: 0.625 V
Lead Channel Pacing Threshold Pulse Width: 0.4 ms
Lead Channel Pacing Threshold Pulse Width: 1 ms
Lead Channel Sensing Intrinsic Amplitude: 0.625 mV
Lead Channel Sensing Intrinsic Amplitude: 13.75 mV
Lead Channel Sensing Intrinsic Amplitude: 13.75 mV
Lead Channel Setting Pacing Amplitude: 1 V
Lead Channel Setting Pacing Amplitude: 2 V
Lead Channel Setting Pacing Pulse Width: 0.4 ms
Lead Channel Setting Pacing Pulse Width: 1 ms
Lead Channel Setting Sensing Sensitivity: 0.3 mV
Zone Setting Status: 755011

## 2024-07-30 ENCOUNTER — Ambulatory Visit: Payer: Self-pay | Admitting: Cardiovascular Disease

## 2024-07-31 NOTE — Progress Notes (Signed)
 Remote ICD Transmission

## 2024-08-05 ENCOUNTER — Other Ambulatory Visit: Payer: Self-pay | Admitting: Family Medicine

## 2024-08-05 NOTE — Progress Notes (Unsigned)
 " Electrophysiology Office Note:   Date:  08/06/2024  ID:  Mason [name] Baptist, DOB May 06, 1941, MRN [id number]  Primary Cardiologist: Jerel Balding, MD Primary Heart Failure: None Electrophysiologist: Romie Tay Gladis Norton, MD      History of Present Illness:   Mason [name] is a 84 y.o. male with h/o chronic systolic heart failure due to nonischemic cardiomyopathy, permanent atrial fibrillation, complete heart block seen today for  for Electrophysiology evaluation of ICD at the request of Mihai Croitoru.    The cause of his cardiomyopathy remains uncertain.  He does have heart block and required ICD placement.  He had a catheterization in 2019 that showed nonobstructive atherosclerosis.  He was exposed to agent orange in [place].  Discussed the use of AI scribe software for clinical note transcription with the patient, who gave verbal consent to proceed.  History of Present Illness Mason [name] is an 84 year old male who presents for evaluation of cardiac defibrillator battery replacement.  He has been doing well since the cardiac defibrillator was implanted in 2018 and has experienced no problems with the device. The device's battery is nearing the end of its life, with approximately two months remaining.  Today, denies symptoms of palpitations, chest pain, dyspnea, orthopnea, PND, lower extremity edema, claudication, dizziness, presyncope, syncope, bleeding, or neurologic sequela. The patient is tolerating medications without difficulties.      Review of systems complete and found to be negative unless listed in HPI.      EP Information / Studies Reviewed:    EKG is ordered today. Personal review as below.  EKG Interpretation Date/Time:  Wednesday August 06 2024 09:57:22 EST Ventricular Rate:  81 PR Interval:    QRS Duration:  166 QT Interval:  426 QTC Calculation: 494 R Axis:   -83  Text Interpretation: Atrial fibrillation with complete heart block and Ventricular-paced  rhythm When compared with ECG of 05-Mar-2024 10:39, Sinus rhythm is now with complete heart block Vent. rate has increased BY   9 BPM Confirmed by Jahleah Mariscal ([id number]) on 08/06/2024 10:02:52 AM   ICD Interrogation-  reviewed in detail today,  See PACEART report.  Device History: Medtronic BiV ICD implanted 2019 for chronic systolic heart failure, complete heart block History of appropriate therapy: No History of AAD therapy: No   Risk Assessment/Calculations:    CHA2DS2-VASc Score = 6   This indicates a 9.7% annual risk of stroke. The patient's score is based upon: CHF History: 1 HTN History: 1 Diabetes History: 1 Stroke History: 0 Vascular Disease History: 1 Age Score: 2 Gender Score: 0            Physical Exam:   VS:  BP 118/60 (BP Location: Right Arm, Patient Position: Sitting, Cuff Size: Large)   Pulse 81   Ht 5' 11 (1.803 m)   Wt 208 lb (94.3 kg)   SpO2 98%   BMI 29.01 kg/m    Wt Readings from Last 3 Encounters:  08/06/24 208 lb (94.3 kg)  05/29/24 214 lb 6.4 oz (97.3 kg)  05/09/24 213 lb 6.4 oz (96.8 kg)     GEN: Well nourished, well developed in no acute distress NECK: No JVD; No carotid bruits CARDIAC: Regular rate and rhythm, no murmurs, rubs, gallops RESPIRATORY:  Clear to auscultation without rales, wheezing or rhonchi  ABDOMEN: Soft, non-tender, non-distended EXTREMITIES:  No edema; No deformity   ASSESSMENT AND PLAN:    Chronic systolic dysfunction, complete heart block s/p Medtronic CRT-D  euvolemic today  Stable on an appropriate medical regimen Normal ICD function See Elisabeth Art report No changes today His device is nearing ERI.  He Lenis Nettleton need generator change.  Risks and benefits have been discussed.  He understands the risks and has agreed to the procedure.  Explained risks, benefits, and alternatives to generator change, including but not limited to bleeding and infection. Pt verbalized understanding and agrees to proceed.  2.  Permanent  atrial fibrillation: Has complete heart block.  The patient device dependent.  3.  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  Hypertension:well controlled  Disposition:   Follow up with EP Team as usual post procedure   Signed, Bennette Hasty Gladis Norton, MD  "

## 2024-08-06 ENCOUNTER — Encounter: Payer: Self-pay | Admitting: Cardiology

## 2024-08-06 ENCOUNTER — Ambulatory Visit: Attending: Cardiology | Admitting: Cardiology

## 2024-08-06 VITALS — BP 118/60 | HR 81 | Ht 71.0 in | Wt 208.0 lb

## 2024-08-06 DIAGNOSIS — I4821 Permanent atrial fibrillation: Secondary | ICD-10-CM | POA: Insufficient documentation

## 2024-08-06 DIAGNOSIS — I10 Essential (primary) hypertension: Secondary | ICD-10-CM | POA: Diagnosis not present

## 2024-08-06 DIAGNOSIS — D6869 Other thrombophilia: Secondary | ICD-10-CM | POA: Diagnosis not present

## 2024-08-06 DIAGNOSIS — I442 Atrioventricular block, complete: Secondary | ICD-10-CM | POA: Diagnosis not present

## 2024-08-06 LAB — CUP PACEART INCLINIC DEVICE CHECK
Date Time Interrogation Session: 20260114195800
Implantable Lead Connection Status: 753985
Implantable Lead Connection Status: 753985
Implantable Lead Connection Status: 753985
Implantable Lead Implant Date: 20130715
Implantable Lead Implant Date: 20180614
Implantable Lead Implant Date: 20180614
Implantable Lead Location: 753858
Implantable Lead Location: 753859
Implantable Lead Location: 753860
Implantable Lead Model: 4598
Implantable Pulse Generator Implant Date: 20180614

## 2024-08-08 ENCOUNTER — Ambulatory Visit: Payer: Self-pay | Admitting: Cardiology

## 2024-08-11 ENCOUNTER — Encounter: Payer: Self-pay | Admitting: Family Medicine

## 2024-08-11 ENCOUNTER — Ambulatory Visit: Admitting: Family Medicine

## 2024-08-11 VITALS — BP 129/69 | HR 83 | Temp 97.0°F | Ht 71.0 in | Wt 210.6 lb

## 2024-08-11 DIAGNOSIS — N2889 Other specified disorders of kidney and ureter: Secondary | ICD-10-CM | POA: Diagnosis not present

## 2024-08-11 DIAGNOSIS — Z79899 Other long term (current) drug therapy: Secondary | ICD-10-CM

## 2024-08-11 DIAGNOSIS — M549 Dorsalgia, unspecified: Secondary | ICD-10-CM

## 2024-08-11 DIAGNOSIS — N3281 Overactive bladder: Secondary | ICD-10-CM | POA: Diagnosis not present

## 2024-08-11 DIAGNOSIS — M5442 Lumbago with sciatica, left side: Secondary | ICD-10-CM | POA: Diagnosis not present

## 2024-08-11 DIAGNOSIS — G8929 Other chronic pain: Secondary | ICD-10-CM

## 2024-08-11 LAB — BASIC METABOLIC PANEL WITH GFR
BUN: 23 mg/dL (ref 6–23)
CO2: 29 meq/L (ref 19–32)
Calcium: 9.2 mg/dL (ref 8.4–10.5)
Chloride: 103 meq/L (ref 96–112)
Creatinine, Ser: 1.38 mg/dL (ref 0.40–1.50)
GFR: 47.78 mL/min — ABNORMAL LOW
Glucose, Bld: 149 mg/dL — ABNORMAL HIGH (ref 70–99)
Potassium: 4.8 meq/L (ref 3.5–5.1)
Sodium: 138 meq/L (ref 135–145)

## 2024-08-11 MED ORDER — GABAPENTIN 300 MG PO CAPS: 600.0000 mg | ORAL_CAPSULE | Freq: Two times a day (BID) | ORAL | 3 refills | Status: AC

## 2024-08-11 MED ORDER — ROSUVASTATIN CALCIUM 20 MG PO TABS: 20.0000 mg | ORAL_TABLET | Freq: Every day | ORAL | 3 refills | Status: AC

## 2024-08-11 MED ORDER — MECLIZINE HCL 25 MG PO TABS: ORAL_TABLET | ORAL | 0 refills | Status: AC

## 2024-08-11 MED ORDER — OXYBUTYNIN CHLORIDE ER 10 MG PO TB24: 10.0000 mg | ORAL_TABLET | Freq: Every day | ORAL | 3 refills | Status: AC

## 2024-08-11 MED ORDER — OMEPRAZOLE 40 MG PO CPDR: 40.0000 mg | DELAYED_RELEASE_CAPSULE | Freq: Every day | ORAL | 3 refills | Status: AC

## 2024-08-11 MED ORDER — MIRABEGRON ER 25 MG PO TB24: ORAL_TABLET | ORAL | 3 refills | Status: AC

## 2024-08-11 MED ORDER — SURE COMFORT PEN NEEDLES 31G X 5 MM MISC: 11 refills | Status: AC

## 2024-08-11 NOTE — Progress Notes (Signed)
 OFFICE VISIT  08/11/2024  CC:  Chief Complaint  Patient presents with   Medical Management of Chronic Issues    Patient is a 84 y.o. male who presents for 6-month follow-up of chronic renal insufficiency, overactive bladder, chronic mid back pain, and chronic bilateral low back pain with left-sided sciatica. A/P as of last visit: #1 overactive bladder. He is doing well currently on Myrbetriq  25 mg and oxybutynin  XL 10 mg daily.   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 He has been getting home health PT and this has been helping but he finishes next week and feels like he would like to continue this.  Will order this.  INTERIM HX: He feels like he is doing well.   He says bladder emptying is satisfactory.  Mid back and low back pain stable.  He does not have to use oxycodone  much at all.  He does take gabapentin  600 mg twice a day.  PMP AWARE reviewed today: most recent rx for gabapentin  was filled 08/06/2024, # 120, rx by me. Most recent oxycodone  10 mg prescription was filled 04/18/2024, #60, prescription by me. No red flags.  ROS --> no fevers, no CP, no SOB, no wheezing, no cough, no dizziness, no HAs, no rashes, no melena/hematochezia.  No polyuria or polydipsia.   No focal weakness, paresthesias, or tremors.  No acute vision or hearing abnormalities.  No dysuria or unusual/new urinary urgency or frequency.  No recent changes in lower legs. No n/v/d or abd pain.  No palpitations.    Past Medical History:  Diagnosis Date   AICD (automatic cardioverter/defibrillator) present 2018   MDT CRT-D.  Fatigue-->completely pacer dependent.  Pacer settings adjusted 12/2017 to allow more chronotrophc variance with ADL's//exertion.   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   02/2016 EF 40%, diffuse hypokinesis, grade 2 DD.  Myoc perf imaging showed EF 32% 04/2016.  Pt upgraded to CRT-D 01/04/17.   Chronic renal insufficiency, stage III (moderate) 2015   CrCl about 60 ml/min   Clotting disorder    Complete heart block (HCC)    Has dual chamber pacer.   COVID-19 virus infection 01/05/2021   paxlovid   Depression    DOE (dyspnea on exertion)    NYHA class II/III CHF   Dyspnea 2021   with exertion, bending over   Dysrhythmia    A. Fib   Episodic low back pain 01/22/2013   w/intermittent radiculitis (12/2014 his neurologist referred him to pain mgmt for epidural steroid injection)   Erectile dysfunction 2019   due to zoloft --urol rx'd viagra   GERD (gastroesophageal reflux disease)    Glaucoma 2017   H/O tilt table evaluation 11/02/2005   negative   Helicobacter pylori gastritis 01/2016   History of adenomatous polyp of colon 10/12/2011   Dr. Rollin (3 right side of colon- tubular adenomas removed)   History of cardiovascular stress test 05/28/2012   no ischemia, EF 37%, imaging results are unchanged and within normal variance   History of chronic prostatitis    History of kidney stones    History of vertigo    + Hx of posterior HA's.  Neuro (  Dr. Maurice) eval 2011.  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   Hyperlipidemia    Hypertension    Lumbar spondylosis    lumbosacral radiculopathy at L4 by EMG testing, right foot drop (neurologist is Dr. Birder with [facility] in [place])--neurologist referred him to neurosurgery   Migraine    used to have them all the time; none for years (01/04/2017)   Myocardial infarction [facility]) ?1970s   not entirely certain of this   Nephrolithiasis 07/2012   Left UVJ 2 mm stone with dilation of renal collecting  system and slight hydroureter on right   Neuropathy    NICM (nonischemic cardiomyopathy) (HCC)    a. 02/2018 Cath: LM nl, LAD min irregs, LCX no, RCA 20d. [id number]. Fick CO/CI 4.4/2.0.   Osteoarthritis, multiple sites    Shoulders, back, knees   Osteoporosis    Pacemaker 02/05/2012   dual chamber, complete heart block, meddtronic revo, lasted checked 12/2015.  Since no CAD on cath 05/2016, cards recommends upgrade to CRT-D.   Peripheral vascular disease    Peripheral Artery Disease   Permanent atrial fibrillation (HCC)    DCCV 07/09/13-converted, lasted two days, then back into afib--needs lifetime anticoagulation (Xarelto  as of 09/2014)   Prostate cancer screening 09/2017   done by urol annually (normal prostate exam documented + PSA 0.84 as of 10/01/17 urol f/u.  10/2018 urol f/u PSA 0.6, no prostate nodule.   Rectus diastasis    Right ankle sprain 08/2017   w/distal fibula avulsion fx noted on u/s but not plain film-(Dr. Hudnall).   Skin cancer of arm, left    burned it off (01/04/2017)   TIA (transient ischemic attack)    L face and L arm weakness. Peri procedural->a. 03/22/2018 following cath. CT head neg. No MRI b/c has pacer. Likely due to embolus to distal branch of RMCA   Type II diabetes mellitus (HCC)     Past Surgical History:  Procedure Laterality Date   ABI's Bilateral 05/21/2018   normal   ANTERIOR LATERAL LUMBAR FUSION WITH PERCUTANEOUS SCREW 2 LEVEL Left 02/25/2024   Procedure: LEFT ANTERIOR LATERAL LUMBAR FUSION WITH PERCUTANEOUS PEDICLE SCREW LUMBAR TWO-LUMBAR THREE, LUMBAR THREE-LUMBAR FOUR, POSTERIOR LATERAL AND INTERBODY FUSION;  Surgeon: Louis Shove, MD;  Location: [facility] OR;  Service: Neurosurgery;  Laterality: Left;  XLIF - left - L2-L3 - L3-L4 with perc pedicle screws - Posterior Lateral and Interbody fusion   BACK SURGERY     BIV ICD INSERTION CRT-D N/A 01/04/2017   Procedure: BiV ICD ;  Surgeon: Inocencio Soyla Lunger, MD;  Location: [facility] INVASIVE CV LAB;  Service:  Cardiovascular;  Laterality: N/A;   CARDIAC CATHETERIZATION N/A 06/14/2016   Minimal nonobstructive dz, EF 25-35%.  Procedure: Left Heart Cath and Coronary Angiography;  Surgeon: Peter M Jordan, MD;  Location: [facility] INVASIVE CV LAB;  Service: Cardiovascular;  Laterality: N/A;   CARDIOVASCULAR STRESS TEST  2012   2012 nuclear perfusion study: low risk scan; 04/2016 normal myocardial perfusion imaging, EF 32%.   CARDIOVERSION  07/09/2012   Procedure: CARDIOVERSION;  Surgeon: Jerel Balding, MD;  Location: [facility] ENDOSCOPY;  Service: Cardiovascular;  Laterality: N/A;   CATARACT EXTRACTION W/ INTRAOCULAR LENS IMPLANT & ANTERIOR VITRECTOMY, BILATERAL Bilateral    CIRCUMCISION N/A 03/23/2020   Procedure: CIRCUMCISION ADULT;  Surgeon: Rosalind Zachary NOVAK, MD;  Location: [facility] ORS;  Service: Urology;  Laterality: N/A;   COLONOSCOPY W/ POLYPECTOMY  approx 2006; repeated 09/2011   Polyps  on 2013 EGD as well, repeat 12/2014   COLONOSCOPY WITH PROPOFOL  N/A 07/08/2021   adenoma x 1. Procedure: COLONOSCOPY WITH PROPOFOL ;  Surgeon: Rollin Dover, MD;  Location: [facility] ENDOSCOPY;  Service: Endoscopy;  Laterality: N/A;   ESOPHAGEAL DILATION  10/19/2023   Procedure: DILATION, ESOPHAGUS;  Surgeon: Rollin Dover, MD;  Location: [facility] ENDOSCOPY;  Service: Gastroenterology;;   ESOPHAGOGASTRODUODENOSCOPY  10/18/2006   Done due to chronic GERD: Normal, bx showed no barrett's esophagus (Dr. Rollin)   EYE SURGERY  01/08/2023   FLEXOR TENDON REPAIR Left 10/02/2016   Procedure: LEFT RING FINGER WOUND EXPORATION AND FLEXOR TENDON REPAIR AND NERVE REPAIR;  Surgeon: Alm Hummer, MD;  Location: [facility] OR;  Service: Orthopedics;  Laterality: Left;   INSERT / REPLACE / REMOVE PACEMAKER  02/05/2012   dual chamber, sinus node dysfunction, sinus arrest, PAF, Medtronic Revo serial#[id number] H: last checked 05/2015   JOINT REPLACEMENT  2017   LUMBAR LAMINECTOMY Left 1976   L4-5   PACEMAKER REMOVAL  01/04/2017   PERMANENT PACEMAKER INSERTION N/A  02/05/2012   Procedure: PERMANENT PACEMAKER INSERTION;  Surgeon: Jerel Balding, MD; Generator Medtronic [place] model [place] serial number [id number] H Laterality: N/A;   POLYPECTOMY  07/08/2021   Procedure: POLYPECTOMY;  Surgeon: Rollin Dover, MD;  Location: [facility] ENDOSCOPY;  Service: Endoscopy;;   RETINAL DETACHMENT SURGERY Left ~ 1999   REVERSE SHOULDER ARTHROPLASTY Left 2018   Left shoulder reverse [facility] in [place]).   RIGHT/LEFT HEART CATH AND CORONARY ANGIOGRAPHY N/A 03/22/2018   EF 30-35%, no CAD.  Procedure: RIGHT/LEFT HEART CATH AND CORONARY ANGIOGRAPHY;  Surgeon: Cherrie Toribio SAUNDERS, MD;  Location: [facility] INVASIVE CV LAB;  Service: Cardiovascular;  Laterality: N/A;   SAVORY DILATION N/A 10/19/2023   Procedure: EGD, WITH DILATION USING SAVARY-GILLIARD DILATOR OVER GUIDEWIRE;  Surgeon: Rollin Dover, MD;  Location: [facility] ENDOSCOPY;  Service: Gastroenterology;  Laterality: N/A;   SPINE SURGERY  1972   TRANSTHORACIC ECHOCARDIOGRAM  08/25/10; 05/2012; 03/23/16;12/2017   mild asymmetric LVH, normal systolic function, normal diastolic fxn, mild-to-mod mitral regurg, mild aortic valve sclerosis and trace AI, mild aortic root dilatation. 2014 f/u showed EF 40-45%, mod LAE, A FIB.  02/2016 EF 40%, diffuse hypokinesis, grade 2 DD. 12/2017 EF 35-40%,diffuse hypokin,grd III DD, mild MR   WISDOM TOOTH EXTRACTION  06/27/2021   EF 50-55%, mild aortic root/ascending aorta dilatation (41-42 mm).    Outpatient Medications Prior to Visit  Medication Sig Dispense Refill   acetaminophen  (TYLENOL ) 650 MG CR tablet Take 650 mg by mouth every 8 (eight) hours as needed for pain.     COMBIGAN  0.2-0.5 % ophthalmic solution Place 1 drop into both eyes every 12 (twelve) hours.     ENTRESTO  97-103 MG TAKE 1 TABLET TWO TIMES A DAY 180 tablet 3   eplerenone  (INSPRA ) 25 MG tablet Take 1 tablet (25 mg total) by mouth daily. 90 tablet 3   furosemide  (LASIX ) 40 MG tablet Take 1 tablet (40 mg total) by mouth as needed.  For weight gain and/or edema 20 tablet 11   gabapentin  (NEURONTIN ) 300 MG capsule TAKE 2 CAPSULES TWICE A DAY 120 capsule 0   insulin  aspart (NOVOLOG  FLEXPEN) 100 UNIT/ML FlexPen Inject 8-10 units before each meal, 2-3x a day     insulin  glargine (LANTUS  SOLOSTAR) 100 UNIT/ML Solostar Pen Inject 22 Units into the skin daily.     Insulin  Pen Needle (SURE COMFORT PEN NEEDLES) 31G X 8 MM MISC Use to administer insulin . 100 each 5   JARDIANCE  10 MG  TABS tablet TAKE 1 TABLET DAILY 90 tablet 3   latanoprost  (XALATAN ) 0.005 % ophthalmic solution Place 1 drop into both eyes at bedtime.     lidocaine  (LIDODERM ) 5 % PLACE 1 PATCH ON THE SKIN DAILY, REMOVE AND DISCARD PATCH WITHIN 12 HOURS OR AS DIRECTED BY DOCTOR (OFFICE VISIT NEEDED FOR FURTHER REFILLS) 90 patch 3   meclizine  (ANTIVERT ) 25 MG tablet TAKE 1 TABLET THREE TIMES A DAY AS NEEDED FOR DIZZINESS 270 tablet 0   mirabegron  ER (MYRBETRIQ ) 25 MG TB24 tablet 1 tab po qd 90 tablet 1   omeprazole  (PRILOSEC) 40 MG capsule Take 1 capsule (40 mg total) by mouth daily. 90 capsule 1   oxybutynin  (DITROPAN -XL) 10 MG 24 hr tablet TAKE 1 TABLET AT BEDTIME 90 tablet 0   Oxycodone  HCl 10 MG TABS 1 tab po bid prn pain 60 tablet 0   polyethylene glycol (MIRALAX ) 17 g packet Take 17 g by mouth daily. (Patient taking differently: Take 17 g by mouth daily as needed.) 14 each 0   Rivaroxaban  (XARELTO ) 20 MG TABS tablet Take 1 tablet (20 mg total) by mouth daily with supper. 90 tablet 0   rOPINIRole  (REQUIP ) 1 MG tablet Take 1 tablet (1 mg total) by mouth at bedtime. 90 tablet 3   rosuvastatin  (CRESTOR ) 20 MG tablet Take 1 tablet (20 mg total) by mouth at bedtime. 90 tablet 1   Semaglutide , 1 MG/DOSE, 4 MG/3ML SOPN Inject 1 mg as directed once a week. 9 mL 3   sertraline  (ZOLOFT ) 100 MG tablet Take 1 tablet (100 mg total) by mouth daily. 90 tablet 0   No facility-administered medications prior to visit.    Allergies[1]  Review of Systems As per HPI  PE:     08/11/2024    1:25 PM 08/06/2024    9:52 AM 05/29/2024   10:22 AM  Vitals with BMI  Height 5' 11 5' 11 5' 11  Weight 210 lbs 10 oz 208 lbs 214 lbs 6 oz  BMI 29.39 29.02 29.92  Systolic 129 118 879  Diastolic 69 60 60  Pulse 83 81      Physical Exam  Gen: Alert, well appearing.  Patient is oriented to person, place, time, and situation. AFFECT: pleasant, lucid thought and speech. No further exam today  LABS:  Last CBC Lab Results  Component Value Date   WBC 5.8 03/20/2024   HGB 14.1 03/20/2024   HCT 42.5 03/20/2024   MCV 89.1 03/20/2024   MCH 29.6 03/20/2024   RDW 12.6 03/20/2024   PLT 160 03/20/2024   Last metabolic panel Lab Results  Component Value Date   GLUCOSE 125 (H) 03/20/2024   NA 136 03/20/2024   K 4.0 03/20/2024   CL 103 03/20/2024   CO2 22 03/20/2024   BUN 18 03/20/2024   CREATININE 1.40 (H) 03/20/2024   GFRNONAA 50 (L) 03/20/2024   CALCIUM  8.5 (L) 03/20/2024   PROT 6.1 (L) 03/20/2024   ALBUMIN 3.1 (L) 03/20/2024   BILITOT 0.6 03/20/2024   ALKPHOS 122 03/20/2024   AST 32 03/20/2024   ALT 25 03/20/2024   ANIONGAP 11 03/20/2024   Last lipids Lab Results  Component Value Date   CHOL 130 04/24/2024   HDL 45 04/24/2024   LDLCALC 66 04/24/2024   LDLDIRECT 103.0 06/22/2020   TRIG 99 04/24/2024   CHOLHDL 2.9 04/24/2024   Last hemoglobin A1c Lab Results  Component Value Date   HGBA1C 6.8 (A) 05/29/2024   Last thyroid  functions  Lab Results  Component Value Date   TSH 2.963 04/04/2018   FREET4 1.12 07/09/2012   IMPRESSION AND PLAN:  #1 overactive bladder.  Doing well on Myrbetriq  25 mg a day and Ditropan  XL 10 mg nightly.  #2 chronic renal insufficiency stage IIIa. Avoid NSAIDs. Monitor renal function today.  3.  Chronic pain syndrome: Chronic bilateral low back pain with left sciatica.  Chronic mid back pain. Well-controlled on gabapentin  600 mg twice daily and oxycodone  10 mg twice daily as needed. A new prescription for oxycodone   was not needed today.  An After Visit Summary was printed and given to the patient.  FOLLOW UP: No follow-ups on file.  Signed:  Gerlene Hockey, MD           08/11/2024     [1] No Known Allergies

## 2024-08-12 ENCOUNTER — Ambulatory Visit: Payer: Self-pay | Admitting: Family Medicine

## 2024-08-21 ENCOUNTER — Encounter (HOSPITAL_BASED_OUTPATIENT_CLINIC_OR_DEPARTMENT_OTHER): Admitting: Orthopaedic Surgery

## 2024-08-21 ENCOUNTER — Other Ambulatory Visit: Payer: Self-pay

## 2024-08-21 MED ORDER — OXYCODONE HCL 10 MG PO TABS: ORAL_TABLET | ORAL | 0 refills | Status: AC

## 2024-08-21 NOTE — Telephone Encounter (Signed)
 Requesting: oxycodone  Contract: N/A UDS: N/A Last Visit: 08/11/24 Next Visit: 11/10/24 Last Refill: 04/16/24 (60,0)  Please Advise. Rx pending

## 2024-08-25 ENCOUNTER — Telehealth: Payer: Self-pay

## 2024-08-25 DIAGNOSIS — G8929 Other chronic pain: Secondary | ICD-10-CM

## 2024-08-26 ENCOUNTER — Encounter: Payer: Self-pay | Admitting: Family Medicine

## 2024-08-27 ENCOUNTER — Ambulatory Visit

## 2024-08-27 LAB — CUP PACEART REMOTE DEVICE CHECK
Battery Remaining Longevity: 2 mo
Battery Voltage: 2.75 V
Brady Statistic AP VP Percent: 0 %
Brady Statistic AP VS Percent: 0 %
Brady Statistic AS VP Percent: 0 %
Brady Statistic AS VS Percent: 0 %
Brady Statistic RA Percent Paced: 0 %
Brady Statistic RV Percent Paced: 99.92 %
Date Time Interrogation Session: 20260204042305
HighPow Impedance: 75 Ohm
Implantable Lead Connection Status: 753985
Implantable Lead Connection Status: 753985
Implantable Lead Connection Status: 753985
Implantable Lead Implant Date: 20130715
Implantable Lead Implant Date: 20180614
Implantable Lead Implant Date: 20180614
Implantable Lead Location: 753858
Implantable Lead Location: 753859
Implantable Lead Location: 753860
Implantable Lead Model: 4598
Implantable Pulse Generator Implant Date: 20180614
Lead Channel Impedance Value: 1007 Ohm
Lead Channel Impedance Value: 201.488
Lead Channel Impedance Value: 215.064
Lead Channel Impedance Value: 223.82 Ohm
Lead Channel Impedance Value: 257.018
Lead Channel Impedance Value: 279.525
Lead Channel Impedance Value: 342 Ohm
Lead Channel Impedance Value: 361 Ohm
Lead Channel Impedance Value: 399 Ohm
Lead Channel Impedance Value: 418 Ohm
Lead Channel Impedance Value: 456 Ohm
Lead Channel Impedance Value: 532 Ohm
Lead Channel Impedance Value: 589 Ohm
Lead Channel Impedance Value: 703 Ohm
Lead Channel Impedance Value: 703 Ohm
Lead Channel Impedance Value: 836 Ohm
Lead Channel Impedance Value: 874 Ohm
Lead Channel Impedance Value: 950 Ohm
Lead Channel Pacing Threshold Amplitude: 0.5 V
Lead Channel Pacing Threshold Amplitude: 0.625 V
Lead Channel Pacing Threshold Pulse Width: 0.4 ms
Lead Channel Pacing Threshold Pulse Width: 1 ms
Lead Channel Sensing Intrinsic Amplitude: 0.625 mV
Lead Channel Sensing Intrinsic Amplitude: 13.75 mV
Lead Channel Sensing Intrinsic Amplitude: 13.75 mV
Lead Channel Setting Pacing Amplitude: 1 V
Lead Channel Setting Pacing Amplitude: 2 V
Lead Channel Setting Pacing Pulse Width: 0.4 ms
Lead Channel Setting Pacing Pulse Width: 1 ms
Lead Channel Setting Sensing Sensitivity: 0.3 mV
Zone Setting Status: 755011

## 2024-08-28 ENCOUNTER — Telehealth: Payer: Self-pay | Admitting: Pharmacist

## 2024-08-28 ENCOUNTER — Encounter

## 2024-08-28 ENCOUNTER — Other Ambulatory Visit: Payer: Self-pay | Admitting: Pharmacist

## 2024-08-28 NOTE — Progress Notes (Unsigned)
 Mason [name]

## 2024-08-28 NOTE — Telephone Encounter (Signed)
 Attempt was made to contact patient by phone today for follow up by Clinical Pharmacist regarding medication management / dose for Xarelto .  Unable to reach patient. LM on VM with my contact number [phone].

## 2024-08-29 NOTE — Telephone Encounter (Signed)
 Patient returned my call but I was with another patient. He left a message. I tried to call back but got his VM. LM on VM with CB# [phone]

## 2024-09-10 ENCOUNTER — Ambulatory Visit: Payer: Medicare Other

## 2024-09-17 ENCOUNTER — Ambulatory Visit: Admitting: Internal Medicine

## 2024-09-18 ENCOUNTER — Encounter (HOSPITAL_BASED_OUTPATIENT_CLINIC_OR_DEPARTMENT_OTHER): Admitting: Orthopaedic Surgery

## 2024-09-27 ENCOUNTER — Ambulatory Visit

## 2024-09-29 ENCOUNTER — Encounter

## 2024-10-28 ENCOUNTER — Ambulatory Visit

## 2024-10-30 ENCOUNTER — Encounter

## 2024-11-10 ENCOUNTER — Ambulatory Visit: Admitting: Family Medicine

## 2024-11-28 ENCOUNTER — Ambulatory Visit

## 2024-12-01 ENCOUNTER — Encounter
